# Patient Record
Sex: Male | Born: 1940
Health system: Southern US, Community
[De-identification: ages and names within clinical notes are randomized; demographics above are authoritative.]

## PROBLEM LIST (undated history)

## (undated) DIAGNOSIS — E222 Syndrome of inappropriate secretion of antidiuretic hormone: Secondary | ICD-10-CM

## (undated) DIAGNOSIS — I639 Cerebral infarction, unspecified: Secondary | ICD-10-CM

## (undated) DIAGNOSIS — I10 Essential (primary) hypertension: Secondary | ICD-10-CM

## (undated) DIAGNOSIS — N400 Enlarged prostate without lower urinary tract symptoms: Secondary | ICD-10-CM

## (undated) DIAGNOSIS — F419 Anxiety disorder, unspecified: Secondary | ICD-10-CM

## (undated) DIAGNOSIS — B029 Zoster without complications: Secondary | ICD-10-CM

## (undated) DIAGNOSIS — M1990 Unspecified osteoarthritis, unspecified site: Secondary | ICD-10-CM

## (undated) DIAGNOSIS — C4492 Squamous cell carcinoma of skin, unspecified: Secondary | ICD-10-CM

## (undated) DIAGNOSIS — K219 Gastro-esophageal reflux disease without esophagitis: Secondary | ICD-10-CM

## (undated) DIAGNOSIS — H269 Unspecified cataract: Secondary | ICD-10-CM

## (undated) DIAGNOSIS — I96 Gangrene, not elsewhere classified: Secondary | ICD-10-CM

## (undated) DIAGNOSIS — I2510 Atherosclerotic heart disease of native coronary artery without angina pectoris: Secondary | ICD-10-CM

## (undated) DIAGNOSIS — I509 Heart failure, unspecified: Secondary | ICD-10-CM

## (undated) DIAGNOSIS — I214 Non-ST elevation (NSTEMI) myocardial infarction: Secondary | ICD-10-CM

## (undated) DIAGNOSIS — IMO0001 Reserved for inherently not codable concepts without codable children: Secondary | ICD-10-CM

## (undated) DIAGNOSIS — K279 Peptic ulcer, site unspecified, unspecified as acute or chronic, without hemorrhage or perforation: Secondary | ICD-10-CM

## (undated) HISTORY — PX: TOTAL KNEE ARTHROPLASTY: SHX125

## (undated) HISTORY — PX: OTHER SURGICAL HISTORY: SHX169

## (undated) HISTORY — PX: UPPER GASTROINTESTINAL ENDOSCOPY: SHX188

## (undated) HISTORY — PX: INGUINAL HERNIA REPAIR: SUR1180

## (undated) HISTORY — DX: Unspecified osteoarthritis, unspecified site: M19.90

## (undated) HISTORY — DX: Unspecified cataract: H26.9

## (undated) HISTORY — PX: TONSILLECTOMY AND ADENOIDECTOMY: SHX28

## (undated) HISTORY — PX: EYE SURGERY: SHX253

## (undated) HISTORY — PX: COLONOSCOPY: SHX174

## (undated) HISTORY — PX: TRANSURETHRAL RESECTION OF PROSTATE: SHX73

## (undated) HISTORY — PX: REVERSE TOTAL SHOULDER ARTHROPLASTY: SHX2344

## (undated) HISTORY — DX: Zoster without complications: B02.9

## (undated) HISTORY — DX: Benign prostatic hyperplasia without lower urinary tract symptoms: N40.0

## (undated) HISTORY — DX: Squamous cell carcinoma of skin, unspecified: C44.92

## (undated) HISTORY — PX: CATARACT EXTRACTION, BILATERAL: SHX1313

## (undated) HISTORY — DX: Essential (primary) hypertension: I10

## (undated) HISTORY — DX: Gastro-esophageal reflux disease without esophagitis: K21.9

---

## 2001-01-14 ENCOUNTER — Ambulatory Visit (HOSPITAL_BASED_OUTPATIENT_CLINIC_OR_DEPARTMENT_OTHER): Admission: RE | Admit: 2001-01-14 | Discharge: 2001-01-15 | Payer: Self-pay | Admitting: Orthopedic Surgery

## 2007-11-02 LAB — METABOLIC PANEL, COMPREHENSIVE
A-G Ratio: 0.5 — ABNORMAL LOW (ref 1.1–2.2)
ALT (SGPT): 31 U/L (ref 30–65)
AST (SGOT): 23 U/L (ref 15–37)
Albumin: 2.7 g/dL — ABNORMAL LOW (ref 3.5–5.0)
Alk. phosphatase: 95 U/L (ref 50–136)
Anion gap: 5 mmol/L (ref 5–15)
BUN/Creatinine ratio: 14 (ref 12–20)
BUN: 27 MG/DL — ABNORMAL HIGH (ref 6–20)
Bilirubin, total: 0.5 MG/DL (ref <1.0)
CO2: 30 MMOL/L (ref 21–32)
Calcium: 7.9 MG/DL — ABNORMAL LOW (ref 8.5–10.1)
Chloride: 96 MMOL/L — ABNORMAL LOW (ref 97–108)
Creatinine: 1.9 MG/DL — ABNORMAL HIGH (ref 0.6–1.3)
GFR est AA: 46 mL/min/{1.73_m2} — ABNORMAL LOW (ref >60)
GFR est non-AA: 38 mL/min/{1.73_m2} — ABNORMAL LOW (ref >60)
Globulin: 5.1 g/dL — ABNORMAL HIGH (ref 2.0–4.0)
Glucose: 298 MG/DL — ABNORMAL HIGH (ref 50–100)
Potassium: 3.4 MMOL/L — ABNORMAL LOW (ref 3.5–5.1)
Protein, total: 7.8 g/dL (ref 6.4–8.2)
Sodium: 131 MMOL/L — ABNORMAL LOW (ref 136–145)

## 2007-11-02 LAB — CBC WITH AUTOMATED DIFF
ABS. BASOPHILS: 0 10*3/uL (ref 0.0–0.1)
ABS. BASOPHILS: 0 10*3/uL (ref 0.0–0.1)
ABS. EOSINOPHILS: 0.1 10*3/uL (ref 0.0–0.4)
ABS. EOSINOPHILS: 0 10*3/uL (ref 0.0–0.4)
ABS. LYMPHOCYTES: 1.8 10*3/uL (ref 0.8–3.5)
ABS. LYMPHOCYTES: 1.5 10*3/uL (ref 0.8–3.5)
ABS. MONOCYTES: 1.2 10*3/uL — ABNORMAL HIGH (ref 0–1.0)
ABS. MONOCYTES: 0.8 10*3/uL (ref 0–1.0)
ABS. NEUTROPHILS: 9.5 10*3/uL — ABNORMAL HIGH (ref 1.8–8.0)
ABS. NEUTROPHILS: 9.5 10*3/uL — ABNORMAL HIGH (ref 1.8–8.0)
BASOPHILS: 0 % (ref 0–1)
BASOPHILS: 0 % (ref 0–1)
EOSINOPHILS: 1 % (ref 0–7)
EOSINOPHILS: 0 % (ref 0–7)
HCT: 33.1 % — ABNORMAL LOW (ref 36.6–50.3)
HCT: 32.3 % — ABNORMAL LOW (ref 36.6–50.3)
HGB: 11.4 g/dL — ABNORMAL LOW (ref 12.1–17.0)
HGB: 10.6 g/dL — ABNORMAL LOW (ref 12.1–17.0)
LYMPHOCYTES: 14 % (ref 12–49)
LYMPHOCYTES: 13 % (ref 12–49)
MCH: 29.8 PG (ref 26.0–34.0)
MCH: 28.6 PG (ref 26.0–34.0)
MCHC: 34.4 g/dL (ref 30.0–35.0)
MCHC: 32.8 g/dL (ref 30.0–35.0)
MCV: 86.4 FL (ref 80.0–99.0)
MCV: 87.3 FL (ref 80.0–99.0)
MONOCYTES: 10 % (ref 5–13)
MONOCYTES: 7 % (ref 5–13)
NEUTROPHILS: 75 % (ref 32–75)
NEUTROPHILS: 80 % — ABNORMAL HIGH (ref 32–75)
PLATELET: 137 10*3/uL — ABNORMAL LOW (ref 150–400)
PLATELET: 157 10*3/uL (ref 150–400)
RBC: 3.83 M/uL — ABNORMAL LOW (ref 4.10–5.70)
RBC: 3.7 M/uL — ABNORMAL LOW (ref 4.10–5.70)
RDW: 16.1 % — ABNORMAL HIGH (ref 11.5–14.5)
RDW: 16.3 % — ABNORMAL HIGH (ref 11.5–14.5)
WBC: 12.6 10*3/uL — ABNORMAL HIGH (ref 4.1–11.1)
WBC: 11.8 10*3/uL — ABNORMAL HIGH (ref 4.1–11.1)

## 2007-11-02 LAB — TROPONIN I
Troponin-I, Qt.: 0.2 ng/mL — ABNORMAL HIGH (ref 0.0–0.05)
Troponin-I, Qt.: 0.09 ng/mL — ABNORMAL HIGH (ref 0.0–0.05)

## 2007-11-02 LAB — MAGNESIUM: Magnesium: 2.1 MG/DL (ref 1.6–2.4)

## 2007-11-02 LAB — PROTHROMBIN TIME + INR
INR: 1.1 (ref 0.9–1.1)
Prothrombin time: 11.4 SECS — ABNORMAL HIGH (ref 9.0–11.0)

## 2007-11-02 LAB — URINALYSIS W/ REFLEX CULTURE
Bacteria: NEGATIVE /HPF
Bilirubin: NEGATIVE
Glucose: 100 MG/DL — AB
Ketone: NEGATIVE MG/DL
Leukocyte Esterase: NEGATIVE
Nitrites: NEGATIVE
Protein: 300 MG/DL — AB
Specific gravity: 1.014 (ref 1.003–1.030)
Urobilinogen: 1 EU/DL (ref 0.2–1.0)
pH (UA): 5.5 (ref 5.0–8.0)

## 2007-11-02 LAB — PTT, ACTIVATED: aPTT: 31.7 s (ref 24.0–33.0)

## 2007-11-02 LAB — CK-MB,QUANT.
CK - MB: 2.2 NG/ML (ref 0–5)
CK-MB Index: 0.8 (ref 0–2.5)

## 2007-11-02 LAB — RETICULOCYTE COUNT: Reticulocyte count: 1.3 % (ref 0.5–1.5)

## 2007-11-02 LAB — CK W/ CKMB & INDEX
CK - MB: 1.2 NG/ML (ref 0–5)
CK-MB Index: 0.5 (ref 0–2.5)
CK: 251 U/L — ABNORMAL HIGH (ref 35–232)

## 2007-11-02 LAB — METABOLIC PANEL, BASIC
Anion gap: 6 mmol/L (ref 5–15)
BUN/Creatinine ratio: 15 (ref 12–20)
BUN: 24 MG/DL — ABNORMAL HIGH (ref 6–20)
CO2: 32 MMOL/L (ref 21–32)
Calcium: 8.2 MG/DL — ABNORMAL LOW (ref 8.5–10.1)
Chloride: 99 MMOL/L (ref 97–108)
Creatinine: 1.6 MG/DL — ABNORMAL HIGH (ref 0.6–1.3)
GFR est AA: 56 mL/min/{1.73_m2} — ABNORMAL LOW (ref >60)
GFR est non-AA: 46 mL/min/{1.73_m2} — ABNORMAL LOW (ref >60)
Glucose: 131 MG/DL — ABNORMAL HIGH (ref 50–100)
Potassium: 3.4 MMOL/L — ABNORMAL LOW (ref 3.5–5.1)
Sodium: 137 MMOL/L (ref 136–145)

## 2007-11-02 LAB — GLUCOSE, POC
Glucose (POC): 198 mg/dL — ABNORMAL HIGH (ref 75–110)
Glucose (POC): 42 mg/dL — CL (ref 75–110)
Glucose (POC): 43 mg/dL — CL (ref 75–110)
Glucose (POC): 99 mg/dL (ref 75–110)
Glucose (POC): 388 mg/dL — ABNORMAL HIGH (ref 75–110)
Glucose (POC): 357 mg/dL — ABNORMAL HIGH (ref 75–110)

## 2007-11-02 LAB — VITAMIN B12 & FOLATE
Folate: 11.3 ng/mL (ref 5.4–24.0)
Vitamin B12: 1333 pg/mL — ABNORMAL HIGH (ref 211–911)

## 2007-11-02 LAB — BNP: BNP: 102 PG/ML — ABNORMAL HIGH (ref 0–100)

## 2007-11-02 LAB — PHOSPHORUS: Phosphorus: 3.6 MG/DL (ref 2.5–4.9)

## 2007-11-02 LAB — TSH 3RD GENERATION: TSH: 2.37 u[IU]/mL (ref 0.35–5.5)

## 2007-11-02 LAB — CK W/ REFLX CKMB: CK: 281 U/L — ABNORMAL HIGH (ref 35–232)

## 2007-11-02 LAB — FERRITIN: Ferritin: 96 NG/ML (ref 22–322)

## 2007-11-02 LAB — IRON: Iron: 21 ug/dL — ABNORMAL LOW (ref 35–150)

## 2007-11-02 NOTE — Consults (Signed)
Name: Travis [name], Travis [name] Admitted: 11/01/2007  MR #: [id number] DOB: July 06, 1940  Account #: [id number] Age: 67  Consultant: Tharon Aquas, MD Location: [phone]     CONSULTATION REPORT    DATE OF CONSULTATION: 11/02/2007  REFERRING PHYSICIAN:      REQUESTING PHYSICIAN: Orpah Clinton, MD    CARDIOLOGIST: Carmelia Roller, MD    REASON FOR CONSULTATION: Abnormal troponin.    HISTORY OF PRESENT ILLNESS: The patient is a 67 year old male with a  history of known coronary artery disease and prior myocardial infarction.  His last catheterization was in 2006. There was moderate disease in  multiple blood vessels, but also an occluded small left circumflex. His  last stress test was in September 2008, showing ischemia in the circumflex  territory, but not elsewhere. His LV systolic function was normal. The  patient has been doing well from a cardiac standpoint without complaints  of chest pain or shortness of breath and has been active. His problems  started a couple days ago (Friday) and he has a variety of complaints,  including weakness, chills, and cough with sputum production. He has been  febrile in the hospital with temperatures of 101.8. Chest x-ray is  consistent with infiltrates at the bases bilaterally. He is being treated  for probable pneumonia. During the course of his workup, he has had  cardiac enzymes checked with a troponin level which has been mildly  abnormal in the indeterminate range at 0.20 to 0.09. His CK-MB was normal  at 1.2. The patient denies any worsening cardiac symptoms and felt well  until just Friday.    PAST MEDICAL HISTORY  1. Coronary artery disease. Last cardiac catheterization was in July  2006; it demonstrated mild-to-moderate diffuse disease with complete  occlusion of the proximal circumflex, which was a relatively small vessel,  LVEF of 65%. He was medically managed. His last stress test was February 06, 2007, showing ischemia in the circumflex territory, EF 55%. Last   echocardiogram was August 2008, showing LVH with left atrial enlargement  and normal function. The patient has a history of an MI back in 2006. He  has been medically managed.  2. Peripheral vascular disease.  3. Diabetes.  4. Hypertension.  5. Hyperlipidemia.  6. Hypothyroidism.  7. Anemia.  8. Pneumonia.  9. BPH.    MEDICATIONS ON ARRIVAL: Included  1. Aspirin 81.  2. Cardizem 240.  3. Lasix 40 twice daily.  4. Imdur 60.  5. Lantus.  6. Levothyroxine.  7. Lisinopril 40 daily.  8. Metoprolol 100 twice daily.  9. Plavix 75 daily.  10. Terazosin 10 at bedtime.  11. Zocor 40 each evening.  12. Zantac 150 twice daily.    ALLERGIES: No known drug allergies.    SOCIAL HISTORY: The patient lives at home. Rare alcohol use, no tobacco.      FAMILY HISTORY: Noncontributory.    REVIEW OF SYSTEMS: Positive for weakness, diarrhea, chills, cough, sputum  production. A 10-point review of systems is otherwise negative. No  headaches, vision changes, weight changes, chest pain, abdominal pain,  palpitations, orthopnea, syncope, or PND.    PHYSICAL EXAMINATION  VITAL SIGNS: Temperature 101.8, BP has been 171/71 or greater, pulse 80.  GENERAL: The patient is in no apparent distress.  HEENT: Normocephalic, atraumatic.  NECK: Supple. JVP is not noticeably elevated.  HEART: Regular rate and rhythm. A 1/6 systolic ejection murmur.  LUNGS: There are coarse ________ of the bases bilaterally, but otherwise  clear.  ABDOMEN: Soft,  nontender, nondistended.  EXTREMITIES: No clubbing, cyanosis, or edema.  PSYCHIATRIC: Appropriate affect.    EKG: Sinus rhythm with lateral T-wave inversion. There is no change in  this EKG from prior.    Chest x-ray: Low-grade bibasilar infiltrates.    LABORATORY DATA: White count 11.8, hemoglobin 10.6, platelets 157. Sodium  137, potassium 3.4, creatinine 1.6, glucose 131. Troponin of 0.09  to 0.20. BNP 102. INR 1.1.    ASSESSMENT AND PLAN: The patient is a 67 year old male with a history of   hypertension, coronary artery disease, diabetes, hyperlipidemia, and  hypothyroidism, who is admitted with pneumonia. Cardiology was consulted  for his abnormal troponin. His troponin is in the indeterminate range.  His EKG is unchanged and he denies any cardiac symptoms. His troponin is  likely elevated due to his known CAD, LVH, acute renal failure, or possibly  increased demand from the infection. There is no evidence for an acute  coronary syndrome. I will treat him medically, as you are, for blood  pressure control as well as the infection. He does need tighter blood  pressure control it seems. No further cardiac testing is needed at this  time. The patient can see Dr. Delton See as an outpatient for further plans.        E-Signed By  Tharon Aquas, MD 11/16/2007 09:51    Tharon Aquas, MD    cc: Orpah Clinton, MD   Tharon Aquas, M.D.        SAK/WMX; D: 11/02/2007 1:40 P; T: 11/02/2007 2:55 P; DOC# [id number]; Job#  [id number]

## 2007-11-02 NOTE — H&P (Addendum)
Name: Travis [name], Travis [name] Admitted: 11/01/2007  MR #: [id number] DOB: 1941-02-19  Account #: [id number] Age: 67  Physician: Esperanza Sheets, MD Location: [phone]     HISTORY PHYSICAL      PRIMARY CARE PHYSICIAN: [facility]    PRIMARY CARDIOLOGIST: Norwood Levo, III, MD    CHIEF COMPLAINT: Weakness.    HISTORY OF PRESENT ILLNESS: The patient is a 67 year old African American  male history of coronary disease, hypertension, diabetes, and prior history  of pneumonia, who presents now to the emergency room with a 2-day history  of a constellation of symptoms. His main complaint is of weakness, but he  also has brief, diarrhea, chills, and presumed fever, cough with sputum  production. He has not taken any additional medications. He has  maintained his usual medications. He has no sick contacts. Initial labs  in the emergency room indicate sepsis secondary to pneumonia, elevated  troponin, uncontrolled diabetes, acute renal failure. Will admit him for  management.    PAST MEDICAL HISTORY: Coronary artery disease with MI, congestive heart  failure, hypertension, diabetes type 2, hyperlipidemia, pneumonia,  hypothyroidism, anemia, benign prostatic hypertrophy.    PAST SURGICAL HISTORY: None.    MEDICATIONS  Doses are unknown to the patient or his family. Includes  1. Iron.  2. Lisinopril  3. Aspirin.  4. Synthroid.  5. Isosorbide mononitrate.  6. Fish oil.  7. Vitamin B12.  8. Metoprolol.  9. Terazosin.  10. Lasix.  11. Ranitidine.  12. Diltiazem.  13. Crestor.  14. Novolin.  15. Lantus.    ALLERGIES: None.    SOCIAL HISTORY: Lives at home. Rare alcohol. No tobacco or drugs.    FAMILY HISTORY: Of diabetes and cancer.    REVIEW OF SYSTEMS: Positive for the weakness, diarrhea, chills, cough,  sputum production. Negative for any headache, vision change, hearing  change, rhinitis, sore throat, stiff neck, orthopnea, chest pain,   palpitations, abdominal pain, nausea, vomiting, constipation, dysuria,  polyuria, skin changes, rashes, bleeding, bruising, myalgias, arthralgias,  night sweats, weight loss, depression.    PHYSICAL EXAMINATION  VITAL SIGNS: Temperature 99.3, blood pressure 171/71, pulse 79,  respirations 20. Saturating 96% on room air. Pain 0/10.  GENERAL: The patient is a well-developed, adult [place] American male,  lying in bed, in no apparent distress. He appears his stated age.  HEAD: Normocephalic, atraumatic. Pupils equal, round, reactive to light.   Extraocular movements intact. Conjunctivae are pale and normal. Sclerae  are white. Nares are patent. Oropharynx clean, dry, no lesions.  NECK: Supple. No lymphadenopathy. No JVD. No bruits.  LUNGS: Clear to auscultation bilaterally, with trace rhonchi, trace  crackles. No accessory muscle use. He did have minimal cough during the  exam.  HEART: With a regular rate and rhythm, 1/6 systolic ejection murmur. No  thrills or bruits. There are 2+ pulses in his periphery.  ABDOMEN: Soft, nontender, nondistended, positive bowel sounds. No palpable  mass. No guarding, no rebound.  EXTREMITIES: Cool, dry, full range of motion, 5- out of 5 strength in the  upper and lower left and right extremities. There is no clubbing, cyanosis,  or edema.  SKIN: Otherwise normal for age and race. No rashes or ulcers. Slightly  decreased turgor. No bleeding or bruising.  NEUROLOGIC: Cranial nerves 2 to 12 grossly intact. Deep tendon reflexes  normal. Sensation normal.  PSYCHIATRIC: Alert and x3. Flat affect, poor insight, poor distant and  recent memory.    LABORATORY DATA: Show white blood cells 12.6, with  75% granulocytes;  hemoglobin 11.4, platelets 137. PT 11, INR 1.1, aPTT 32. Sodium 131,  potassium 3.4, chloride 96, bicarbonate 30, BUN 27, creatinine 1.9, glucose  298. Calcium 7.9, albumin 2.7, AST 23, ALT 31, alkaline phosphatase 95,  bilirubin 0.5, CPK of 281, troponin 0.20.     Blood cultures are pending. Urinalysis is pending and cultures are  pending.    ELECTROCARDIOGRAM: Shows first degree AV block, normal sinus rhythm  otherwise. No ST or T-wave abnormalities otherwise, except for T-wave  inversions in the lateral leads.    RADIOLOGIC DATA: Chest x-ray shows bilateral infiltrates.    OVERALL ASSESSMENT: A 67 year old male with several days history of  weakness, chills and cough suggestive of pneumonia, an x-ray which is  ambiguous, elevated white count. Will admit him for pneumonia.    PLAN  1. Pneumonia. Will start empiric ceftriaxone and azithromycin. Give him  oxygen as necessary. Blood cultures are pending.  2. Coronary artery disease, elevated troponins. Will place on  telemetry. Follow cardiac enzymes. Get Dr. Delton See to see him.  3. Diabetes type 2 uncontrolled, with renal complications. Will place  him on protocol. Continue Lantus. Sliding insulin NovoLog. Check a  hemoglobin A1c. Diabetic diet. Counseling.  4. Acute renal failure, secondary to dehydration. Will give him IV fluids  for now.  5. Sepsis. Most likely secondary to pneumonia. He meets criteria based  on fevers and chills at home, with an elevated white count. Give him IV  fluids, antibiotics, supportive care. Blood cultures pending.  6. Chronic kidney disease, secondary to diabetes. Will follow this issue.  7. Hyponatremia. Give him normal saline. Follow the issue. It is also,  in part, due to his Lasix, likely.  8. Hypokalemia. Will replete it. Also secondary to his Lasix.  9. Anemia, mild. Might worsen after hydration. Check his labs. Continue  iron.  10. Hypertension. Resume his usual medications, except Lasix.  11. Hypothyroidism. Continue Synthroid. Check a TSH level.  12. Benign prostatic hypertrophy, currently no symptoms.  Continue terazosin.  13. Hyperlipidemia. Continue Crestor.  14. Congestive heart failure, chronic and stable at this point. No sign   of exacerbation. Will get Dr. Delton See to evaluate this also.    Total time spent on admission including discussion with the patient,  family, nursing, review of records and coordination of care  was approximately 40 minutes.          E-Signed By  Esperanza Sheets, MD 11/02/2007 06:12    Esperanza Sheets, MD    cc: Esperanza Sheets, MD   Norwood Levo, M.D.        MM/wmx; D: 11/01/2007 11:07 P; T: 11/02/2007 12:01 A; DOC# [id number]; Job#  [id number]

## 2007-11-03 LAB — CBC WITH AUTOMATED DIFF
ABS. BASOPHILS: 0 10*3/uL (ref 0.0–0.1)
ABS. EOSINOPHILS: 0.1 10*3/uL (ref 0.0–0.4)
ABS. LYMPHOCYTES: 1.1 10*3/uL (ref 0.8–3.5)
ABS. MONOCYTES: 1.2 10*3/uL — ABNORMAL HIGH (ref 0–1.0)
ABS. NEUTROPHILS: 8.2 10*3/uL — ABNORMAL HIGH (ref 1.8–8.0)
BASOPHILS: 0 % (ref 0–1)
EOSINOPHILS: 1 % (ref 0–7)
HCT: 32.7 % — ABNORMAL LOW (ref 36.6–50.3)
HGB: 10.8 g/dL — ABNORMAL LOW (ref 12.1–17.0)
LYMPHOCYTES: 10 % — ABNORMAL LOW (ref 12–49)
MCH: 28.9 PG (ref 26.0–34.0)
MCHC: 33 g/dL (ref 30.0–35.0)
MCV: 87.4 FL (ref 80.0–99.0)
MONOCYTES: 11 % (ref 5–13)
NEUTROPHILS: 78 % — ABNORMAL HIGH (ref 32–75)
PLATELET: 163 10*3/uL (ref 150–400)
RBC: 3.74 M/uL — ABNORMAL LOW (ref 4.10–5.70)
RDW: 16 % — ABNORMAL HIGH (ref 11.5–14.5)
WBC: 10.5 10*3/uL (ref 4.1–11.1)

## 2007-11-03 LAB — METABOLIC PANEL, BASIC
Anion gap: 6 mmol/L (ref 5–15)
BUN/Creatinine ratio: 13 (ref 12–20)
BUN: 18 MG/DL (ref 6–20)
CO2: 27 MMOL/L (ref 21–32)
Calcium: 8.2 MG/DL — ABNORMAL LOW (ref 8.5–10.1)
Chloride: 101 MMOL/L (ref 97–108)
Creatinine: 1.4 MG/DL — ABNORMAL HIGH (ref 0.6–1.3)
GFR est AA: >60 mL/min/{1.73_m2} (ref >60)
GFR est non-AA: 54 mL/min/{1.73_m2} — ABNORMAL LOW (ref >60)
Glucose: 86 MG/DL (ref 50–100)
Potassium: 3.4 MMOL/L — ABNORMAL LOW (ref 3.5–5.1)
Sodium: 134 MMOL/L — ABNORMAL LOW (ref 136–145)

## 2007-11-03 LAB — GLUCOSE, POC
Glucose (POC): 372 mg/dL — ABNORMAL HIGH (ref 75–110)
Glucose (POC): 118 mg/dL — ABNORMAL HIGH (ref 75–110)
Glucose (POC): 31 mg/dL — CL (ref 75–110)
Glucose (POC): 53 mg/dL — ABNORMAL LOW (ref 75–110)
Glucose (POC): 67 mg/dL — ABNORMAL LOW (ref 75–110)
Glucose (POC): 401 mg/dL — ABNORMAL HIGH (ref 75–110)
Glucose (POC): 341 mg/dL — ABNORMAL HIGH (ref 75–110)
Glucose (POC): 157 mg/dL — ABNORMAL HIGH (ref 75–110)

## 2007-11-03 LAB — HEMOGLOBIN A1C WITH EAG: Hemoglobin A1c: 7.7 % — ABNORMAL HIGH (ref 4.2–5.8)

## 2007-11-04 LAB — GLUCOSE, POC
Glucose (POC): 77 mg/dL (ref 75–110)
Glucose (POC): 297 mg/dL — ABNORMAL HIGH (ref 75–110)
Glucose (POC): 295 mg/dL — ABNORMAL HIGH (ref 75–110)
Glucose (POC): 138 mg/dL — ABNORMAL HIGH (ref 75–110)

## 2007-11-04 LAB — BUN/CREATININE
BUN: 19 mg/dL (ref 6–20)
Creatinine: 1.5 mg/dL — ABNORMAL HIGH (ref 0.6–1.3)
GFR est AA: 60 mL/min/{1.73_m2} — ABNORMAL LOW (ref >60)
GFR est non-AA: 49 mL/min/{1.73_m2} — ABNORMAL LOW (ref >60)

## 2007-11-05 LAB — CBC WITH AUTOMATED DIFF
ABS. BASOPHILS: 0 10*3/uL (ref 0.0–0.1)
ABS. EOSINOPHILS: 0.3 10*3/uL (ref 0.0–0.4)
ABS. LYMPHOCYTES: 2.1 10*3/uL (ref 0.8–3.5)
ABS. MONOCYTES: 1.1 10*3/uL — ABNORMAL HIGH (ref 0–1.0)
ABS. NEUTROPHILS: 6 10*3/uL (ref 1.8–8.0)
BASOPHILS: 0 % (ref 0–1)
EOSINOPHILS: 4 % (ref 0–7)
HCT: 30.8 % — ABNORMAL LOW (ref 36.6–50.3)
HGB: 10.5 g/dL — ABNORMAL LOW (ref 12.1–17.0)
LYMPHOCYTES: 22 % (ref 12–49)
MCH: 29.2 PG (ref 26.0–34.0)
MCHC: 34.1 g/dL (ref 30.0–35.0)
MCV: 85.8 FL (ref 80.0–99.0)
MONOCYTES: 12 % (ref 5–13)
NEUTROPHILS: 62 % (ref 32–75)
PLATELET: 181 10*3/uL (ref 150–400)
RBC: 3.59 M/uL — ABNORMAL LOW (ref 4.10–5.70)
RDW: 15.7 % — ABNORMAL HIGH (ref 11.5–14.5)
WBC: 9.6 10*3/uL (ref 4.1–11.1)

## 2007-11-05 LAB — METABOLIC PANEL, BASIC
Anion gap: 7 mmol/L (ref 5–15)
BUN/Creatinine ratio: 12 (ref 12–20)
BUN: 16 mg/dL (ref 6–20)
CO2: 27 MMOL/L (ref 21–32)
Calcium: 8.1 MG/DL — ABNORMAL LOW (ref 8.5–10.1)
Chloride: 101 MMOL/L (ref 97–108)
Creatinine: 1.3 mg/dL (ref 0.6–1.3)
GFR est AA: >60 mL/min/{1.73_m2} (ref >60)
GFR est non-AA: 58 mL/min/{1.73_m2} — ABNORMAL LOW (ref >60)
Glucose: 213 MG/DL — ABNORMAL HIGH (ref 50–100)
Potassium: 4 MMOL/L (ref 3.5–5.1)
Sodium: 135 MMOL/L — ABNORMAL LOW (ref 136–145)

## 2007-11-05 LAB — GLUCOSE, POC
Glucose (POC): 171 mg/dL — ABNORMAL HIGH (ref 75–110)
Glucose (POC): 275 mg/dL — ABNORMAL HIGH (ref 75–110)
Glucose (POC): 426 mg/dL — ABNORMAL HIGH (ref 75–110)
Glucose (POC): 165 mg/dL — ABNORMAL HIGH (ref 75–110)

## 2007-11-06 LAB — VANCOMYCIN, TROUGH
Reported dose date: 61109
Reported dose time:: 1700
Vancomycin,trough: 6.9 ug/ml (ref 5.0–10.0)

## 2007-11-06 LAB — GLUCOSE, POC
Glucose (POC): 48 mg/dL — CL (ref 75–110)
Glucose (POC): 52 mg/dL — ABNORMAL LOW (ref 75–110)
Glucose (POC): 79 mg/dL (ref 75–110)
Glucose (POC): 94 mg/dL (ref 75–110)
Glucose (POC): 330 mg/dL — ABNORMAL HIGH (ref 75–110)
Glucose (POC): 466 mg/dL — ABNORMAL HIGH (ref 75–110)
Glucose (POC): 161 mg/dL — ABNORMAL HIGH (ref 75–110)

## 2007-11-06 LAB — BUN/CREATININE
BUN: 19 mg/dL (ref 6–20)
Creatinine: 1.5 mg/dL — ABNORMAL HIGH (ref 0.6–1.3)
GFR est AA: 60 mL/min/{1.73_m2} — ABNORMAL LOW (ref >60)
GFR est non-AA: 49 mL/min/{1.73_m2} — ABNORMAL LOW (ref >60)

## 2007-11-06 NOTE — Consults (Signed)
Name: Travis [name], Travis [name] Admitted: 11/01/2007  MR #: [id number] DOB: 11/07/1940  Account #: [id number] Age: 67  Consultant: Kermit Balo, M.D. Location: [id number]     CONSULTATION REPORT    DATE OF CONSULTATION: 11/06/2007  REFERRING PHYSICIAN:      HISTORY OF PRESENT ILLNESS: Dr. Eulah Pont has asked me to see this patient  who is a 67 year old gentleman for pneumonia with persistent fevers. The  patient was originally admitted on November 01, 2007 with complaints of fevers,  chills, and cough. The patient states that he was admitted on Sunday but  had been feeling poorly since about Friday. He thought that he was  initially getting better but then felt worse by the day of admission. The  patient felt generally lethargic and worn out. He states that he was  coughing with mostly clear phlegm. The patient denies any chest pain. He  has had no nausea or vomiting although he does have occasional  indigestion. While in the hospital he has been treated with various  antibiotics. He states that overall he is feeling considerably better than  on admission. His white blood cell count has come down from 12.6 to 9.6  thousand. The major concern at this point is that he has had persistent  fevers. The patient's last recorded fever was at 6 a.m. yesterday morning  when he had a temperature of 101.2. The patient tells me that overall he  is feeling nearly back to baseline except when he does have the fevers  which makes him feel generally poor.    PAST MEDICAL HISTORY: Remarkable for:  1. Coronary artery disease with history of MI.  2. Congestive heart failure.  3. Hypertension.  4. Diabetes.  5. BPH.  6. Hypothyroid.    SOCIAL HISTORY: The patient has a remote history of smoking. He states  that he stopped smoking about 30 years ago. 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 He retired from the [employer] in 2000. The patient lives alone. He drinks alcohol less than  once a week. Of note, is the fact that when he was in the [employer], he was  stationed in [place] as well as [place]. He does not recall ever having  any PPD placed.    FAMILY HISTORY: Negative for inheritable lung disease.    REVIEW OF SYSTEMS: HEENT: The patient denies any headaches or sinus  complaints. CARDIAC: As above. PULMONARY: As above. GASTROINTESTINAL:  As above. GENITOURINARY: Negative. MUSCULOSKELETAL: Remarkable for some  occasional myalgias with fevers. NEUROLOGIC: Negative. SKIN:  Negative. PSYCHIATRIC: Negative.    PHYSICAL EXAMINATION:  VITAL SIGNS: Temperature is 98.3, blood pressure is 193/81, pulse 89.  GENERAL: This is a well-developed [place] American male who is in no acute  distress.  HEENT: The patient's eyes are anicteric.  NECK: Supple without palpable mass, lymphadenopathy.  HEART: Regular rate and rhythm.  LUNGS: The patient has bibasilar crackles. He has no frank wheezing or  rhonchi.  ABDOMEN: Obese. It is soft, nontender.  EXTREMITIES: With 1 to 2+ pretibial edema.  NEUROLOGIC: Grossly nonfocal.  SKIN: Warm and dry.    LABORATORY DATA: White count is 9.6, hemoglobin 10.5, platelet count 181.  Sodium 135, potassium 4.0, chloride 101, CO2 is 27. BUN 16, creatinine 1.3,  glucose 213.    Patient's chest x-ray from 11/01/2007 was reviewed as was his CT scan from  06/11. The patient's chest x-ray revealed very mild patchy densities when  compared to his baseline film on both lower lung fields. The CT scan shows  clear air space disease and consolidation in his left lower lobe. He also  has some patchy nodular densities but no discrete nodules in his right  lower and middle lobes.    IMPRESSION: Pneumonia: The patient's signs and symptoms are most likely  compatible with a community-acquired pneumonia. The patient seems to be  getting better by all parameters for his fever curve and he has actually   now not had a fever in 24 hours. I would not make any antibiotic changes  at this time. If his fevers return or persist through the weekend, I think  if would be worthwhile to go ahead and proceed with fiberoptic  bronchoscopy. If he remains afebrile for the next 24 to 36 hours, however,  he could possibly go home.    PLAN:  1. We will go ahead and place a PPD especially with his history of travel  to [place].  2. If afebrile over the weekend, it is okay from a pulmonary standpoint to  go ahead and discharge him to home.    Thank you for this consultation.      E-Signed By  Kermit Balo, M.D. 11/09/2007 14:49    Marrianne Sica Leroy Libman, M.D.    cc: Kermit Balo, M.D.        KR/WMX; D: 11/06/2007 10:55 A; T: 11/06/2007 11:38 A; DOC# [id number]; Job#  [id number]

## 2007-11-06 NOTE — Discharge Summary (Signed)
Name: Travis [name], Travis [name] Admitted: 11/01/2007  MR #: [id number] Discharged:  Account #: [id number] DOB: Oct 01, 1940  Physician: Orpah Clinton, MD Age 67     DISCHARGE SUMMARY        This is an interim summary.    SERVICE: Hospital Medicine.    ATTENDING PHYSICIAN: Orpah Clinton, MD.    PRIMARY CARE PROVIDER: [facility].    PRINCIPAL DIAGNOSIS: Pneumonia.    SECONDARY DIAGNOSES:  1. Diabetes mellitus.  2. Hypertension.  3. Anemia.    HISTORY OF PRESENT ILLNESS: The patient is a 67 year old African American  gentleman who was admitted through the Emergency Department with  pneumonia.    HOSPITAL COURSE: The patient was admitted to the 5th floor on Hospitalist  Service. He was initially treated with intravenous ceftriaxone on  presentation to the Emergency Department. He was continued on ceftriaxone,  and intravenous azithromycin was added as well. However, he continued to  be febrile. The patient developed a history of methicillin-resistant  Staphylococcus aureus infection in the past, and so vancomycin was started.  However, the patient has persisted to be febrile in the 48 hours since  vancomycin was started, and so today, we have consulted Infectious Diseases  for further management. Infectious Diseases has recommended a CT scan of  the chest. They increased the dose of ceftriaxone from 1 g intravenous  daily to 2 g intravenous daily, and they have changed his azithromycin to  levofloxacin and continued him on vancomycin, and have checked multiple  serologies for possible etiologies for his pneumonia. In regards to  diabetes, blood sugars have been elevated secondary to his infection. His  insulin regimen has been adjusted and will continue to be adjusted as  needed. Regards to his hypertension, blood pressure has been elevated.  His metoprolol has been increased and we will continue to follow his blood  pressure closely. In regards to his anemia, he appears to have mild anemia   without any evidence of active bleeding. His hemoglobin will be followed.    CURRENT MEDICATIONS:  1. Levofloxacin 750 mg intravenous daily.  2. Ceftriaxone 2 g intravenous daily.  3. Vancomycin per pharmacy protocol.  4. Aspirin 81 mg daily.  5. Diltiazem CD 240 mg daily.  6. Enoxaparin 40 mg daily.  7. Iron sulfate 324 mg daily.  8. Lasix 40 mg 2 times a day.  9. Aspart insulin 8 units before breakfast, lunch, and dinner, with  additional sliding scale insulin as needed.  10. Lantus insulin 42 units every morning.  11. Ismo 30 mg daily.  12. Levothyroxine 100 mcg daily.  13. Prinivil 40 mg daily.  14. Metoprolol 100 mg 2 times a day.  15. Protonix 40 mg daily.  16. Crestor 10 mg daily.  17. Terazosin 5 mg daily.            E-Signed By  Orpah Clinton, MD 11/09/2007 15:33    Orpah Clinton, MD    cc: Orpah Clinton, MD        CEB/FI5; D: 11/05/2007 12:38 P; T: 11/06/2007 1:56 A; DOC# [id number]; Job#  [id number]

## 2007-11-06 NOTE — Consults (Signed)
Name: LUI, BELLIS Admitted: 11/01/2007  MR #: [id number] DOB: 1940-06-19  Account #: [id number] Age: 67  Consultant: Georgia Dom, M.D. Location: [id number]     CONSULTATION REPORT    DATE OF CONSULTATION: 11/05/2007  REFERRING PHYSICIAN:      ATTENDING PHYSICIAN: Marijo File, MD    CONSULT REQUESTED BY: Marijo File, MD    CHIEF COMPLAINT: Weakness.    REASON FOR CONSULTATION: Persistent fever.    HISTORY OF PRESENT ILLNESS: The patient is a 67 year old African American  gentleman who has history of coronary artery disease, hypertension,  diabetes, has a history of pneumonitis last year. He had come and he was  admitted on November 01, 2007, by Dr. Eulah Pont with a history today of fever. He  did have some diarrhea and chills. He also had a dry cough. He denied any  sick contacts. He was admitted by Dr. Eulah Pont and he was started on  ceftriaxone for community-acquired pneumonia. Dr. Verlin Fester had added  azithromycin 2 days ago to his regimen and then yesterday, vancomycin was  added since the patient has a history of MRSA infection. Besides that, the  patient has continued to spike temperatures to 101. The last one was this  morning, which prompted Infectious Disease consult. Of note, he has a chest  x-ray on admission which had shown bibasilar infiltrates. The patient, at  this time, has a dry cough, has some pleuritic chest pain, but besides  that, he does not have any other complaints. He does not have anymore  diarrhea. I have been consulted for recommendations on antibiotics and  further workup.    PAST MEDICAL HISTORY:  1. Coronary artery disease.  2. History of congestive heart failure.  3. History of hypertension.  4. Diabetes, type 2.  5. Hyperlipidemia.  6. Pneumonia.  7. Hypothyroidism.  8. Anemia.  9. BPH.    PAST SURGICAL HISTORY: None.    CURRENT ANTIBIOTICS:  1. Vancomycin per pharmacy protocol.  2. He is on azithromycin 1 g IV every 24 hours.    ALLERGIES: No known drug allergies.     SOCIAL HISTORY: He lives at home. He denied any tobacco or IV drug abuse.  Rarely drinks alcohol.    FAMILY HISTORY: Positive for diabetes and cancer.    REVIEW OF SYSTEMS: Positive for fevers, chills, and weakness. Complains of  diarrhea, dry cough. Denies any shortness of breath, any palpitations. He  denies any abdominal pain. Denies any focal weakness. Complains of  generalized malaise.    PHYSICAL EXAMINATION:  GENERAL: Patient appears in no acute distress. He is sitting in bed.  VITAL SIGNS: His temperature maximum is 101.3. Temperature is 99.3. Blood  pressure is 171/71, pulse 79, respiratory rate 20.  HEENT: Pupils equal, round, and reactive to light and accommodation. There  are no conjunctival hemorrhages. Mouth without exudates.  NECK: Supple. No lymphadenopathy.  LUNGS: He has decreased breath sounds bilaterally. He has no rhonchi.  HEART: S1, S2, regular with an ejection murmur at the left sternal border.  ABDOMEN: Soft, nontender, no organomegaly.  EXTREMITIES: Trace edema. There is no cyanosis or clubbing. There is no  peripheral stigmata of endocarditis.  SKIN: No rash.  NEUROLOGIC: He is alert and oriented x3. There is no focal deficit.    LABORATORY DATA: White blood cell count of 10, down from 12.6 on admission,  hemoglobin is 11.4, platelets 137. Sodium 131, potassium 3.4, chloride 96.  AST 23, ALT 31, alkaline phosphatase 95, total bilirubin 0.5. ________ 281.  Again, the chest x-ray shows bibasilar infiltrates.    ASSESSMENT AND PLAN: The patient is a 67 year old gentleman, who presents  with ________ community-acquired pneumonia. He stated he has had some dry  cough, but still has persistent fevers. Leukocytosis slightly better. At  this time, I think as far as antibiotics, I would continue the vancomycin  and the ceftriaxone and will increase his ceftriaxone dose to 2 g IV q.24h.  We will discontinue the azithromycin and switch the patient to levofloxacin   750 mg IV daily. In this patient with pneumonia and persistent fevers, I  will order a CT scan of the chest to rule out any complications of  pneumonia, such as effusions or empyema. Will also order Legionella urine  antigen. This is suspicious for Legionella with the persistent fever, the  hyponatremia on admission, as well as the diarrhea.    I spent more than 45 minutes in the evaluation, care, and management of  this patient. I discussed with Dr. Verlin Fester.    Thank you so much for allowing me to participate in his care. I will follow  him along with you.      E-Signed By  Georgia Dom, M.D. 11/24/2007 14:17    Georgia Dom, M.D.    cc: Georgia Dom, M.D.        AG/WMX; D: 11/06/2007 12:52 P; T: 11/06/2007 1:43 P; DOC# [id number]; Job#  [id number]

## 2007-11-07 LAB — CHLAMYDIA AB PANEL, IGG/IGM
C.Pneumoniae,IgG: 1:256 {titer}
C.Pneumoniae,IgM: <1:20 {titer}
C.Psittaci,IgG: <1:64 {titer}
C.Psittaci,IgM: <1:20 {titer}
C.Trachomatis,IgG: 1:64 {titer}
C.Trachomatis,IgM: <1:20 {titer}

## 2007-11-07 LAB — CULTURE, BLOOD, PAIRED
Culture result:: NO GROWTH
Report Status: 6132009

## 2007-11-07 LAB — VITAMIN B12 & FOLATE
Folate: 10.9 ng/mL (ref 5.4–24.0)
Vitamin B12: 1047 pg/mL — ABNORMAL HIGH (ref 211–911)

## 2007-11-07 LAB — GLUCOSE, POC
Glucose (POC): 219 mg/dL — ABNORMAL HIGH (ref 75–110)
Glucose (POC): 45 mg/dL — CL (ref 75–110)
Glucose (POC): 52 mg/dL — ABNORMAL LOW (ref 75–110)
Glucose (POC): 292 mg/dL — ABNORMAL HIGH (ref 75–110)
Glucose (POC): 443 mg/dL — ABNORMAL HIGH (ref 75–110)

## 2007-11-07 LAB — TSH 3RD GENERATION: TSH: 7.86 u[IU]/mL — ABNORMAL HIGH (ref 0.35–5.5)

## 2007-11-07 NOTE — Discharge Summary (Signed)
Name: Travis [name], LY Admitted: 11/01/2007  MR #: [id number] Discharged: 11/07/2007  Account #: [id number] DOB: 1941-01-25  Physician: Esperanza Sheets, MD Age 67     DISCHARGE SUMMARY      PRIMARY CARE PHYSICIAN: [facility].    PRIMARY CARDIOLOGIST: Dr. Carmelia Roller.    CHIEF COMPLAINT: Weakness.    HISTORY OF PRESENT ILLNESS: See dictated H and P and consultation notes  for additional details. Briefly, the patient is a 67 year old African  American male with multiple medical conditions presenting to the Emergency  Room with weakness. Initial workup indicated white count. Chest x-ray  consistent with pneumonia. He is admitted for management.    HOSPITAL COURSE: By issues:  1. Pneumonia. We had him seen in consultation by Dr. Leroy Libman and Dr.  Lucienne Capers from Pulmonary. He has been placed empirically on Levaquin,  ceftriaxone, and vancomycin. His cultures did not grow any specific  organism. He was febrile on and off for the first 4 days in the hospital  and then this resolved. He is afebrile for the last 48 hours. Also,  consultation with Dr. Jayme Cloud from Infectious Disease, we decided to  switch the patient to oral Levaquin, discharge him to follow up with the  [facility] as necessary. He would be able to take  over-the-counter Robitussin for any cough relief; otherwise, he is not  requiring oxygen and he is stable at discharge.  2. Diabetes, type 2, uncontrolled with renal complications. It has  been very labile. We are going to ask him to reduce his glargine to 30  units until he is seen again by his primary physician because of periods of  slightly decreased glucose down to about 50.  3. Hypertension. We continued his lisinopril, diltiazem, and  metoprolol. His blood pressures were still elevated, so we added  hydralazine up to 20 mg 3 times a day. This can be reevaluated by his  primary physician also.   4. Anemia, mild, chronic, stable. Hemoccult negative. He is already  on iron. We continued that. He had no other B12 or folate deficiencies.  It is also most likely chronic kidney disease contributing.  5. Chronic kidney disease, stage II from diabetes and hypertension,  stable. He can follow up with renal specialist at the [facility] if  necessary.  6. Coronary artery disease. No signs or symptoms in the hospital. We  had him seen by Dr. Laddie Aquas from Cardiology to rule out any acute  myocardial issues relating to his weakness on presentation. There were  none. They do not think he needed any further workup. We continued his  aspirin and isosorbide mononitrate and beta-blocker and ACE inhibitor.  7. Congestive heart failure, chronic, diastolic. We continued his  Lasix, aspirin, ACE inhibitor, and beta-blocker. Again, Cardiology had no  further workup necessary in hospital.  8. Hyperlipidemia. Continue Crestor at 10. Did not check a lipid  panel in this admission for pneumonia.  9. Hypothyroidism. We will continue Synthroid. There are no signs or  symptoms of hyper or hypothyroidism.  10. Benign prostatic hypertrophy. We continued his terazosin. He had no  complaints.  11. Hyponatremia. It is mild, most likely secondary to his acute  pneumonia. To be followed as an outpatient.    PHYSICAL EXAMINATION AT THE TIME OF DISCHARGE: VITAL SIGNS: Temperature  98.8, pulse 85, blood pressure 179/73, respirations 18, and saturating 97%  on room air. GENERAL: The patient is a well-developed, mildly obese  [place] male, lying  in bed in no apparent distress. He appears  his stated age. HEENT: Head examination is normocephalic and atraumatic.  Pupils are equal, round, and reactive to light. Extraocular movements are  intact. Conjunctivae are pink and normal. Sclerae are white. Nares are  patent. Oropharynx: Clean and dry. No lesions. NECK: Supple. There is   no lymphadenopathy. No JVD. No bruits. LUNGS: With scattered rhonchi in  all lung fields. No accessory muscle use. Minimal cough during the  examination. HEART: With a slightly tachycardic, regular rhythm. No  audible murmur, rub, or gallop; 2+ pulses in his periphery. ABDOMEN:  Soft, nontender, and nondistended. Positive bowel sounds. No palpable  mass. No guarding. No rebound. EXTREMITIES: Cool, dry, full range of  motion, 5-/5 strength in upper and lower left and right extremities. No  clubbing, cyanosis, or edema. SKIN EXAMINATION: Otherwise normal for age  and race. No rashes or ulcers. Normal turgor. No bleeding or bruising.  NEUROLOGIC EXAMINATION: Cranial nerves 2 through 12 are grossly intact.  Deep tendon reflexes are normal. Sensation is slightly reduced.  PSYCHIATRIC EXAMINATION: He is alert and oriented x3. Flat affect and  moderate insight.    DISPOSITION: He will be discharged to home in stable condition with:  PRIMARY DIAGNOSIS: Pneumonia, community acquired.    SECONDARY DIAGNOSES:  1. Diabetes type 2 with renal complications.  2. Coronary artery disease.  3. Congestive heart failure, diastolic, stable.  4. Hypertension.  5. Hyperlipidemia.  6. Benign prostatic hypertrophy.  7. Hypothyroidism.  8. Anemia.    MEDICATIONS AT DISCHARGE:  1. Imdur 60 mg once a day.  2. Synthroid 150 mcg once a day.  3. Lisinopril 40 mg a day.  4. Aspirin 81 mg a day.  5. Iron sulfate 325 mg a day.  6. Diltiazem 240 mg once a day.  7. Metoprolol 100 mg twice a day.  8. Crestor 40 mg a day.  9. Terazosin 5 mg at bedtime as needed for sleep.  10. Ranitidine 150 mg twice a day.  11. Lasix 40 mg twice a day.  12. Lantus 30 units a day.  13. NovoLog 8 to 10 units with meals.  14. Levaquin 750 mg by mouth once a day for 7 additional days.  15. Hydralazine 20 mg by mouth 3 times a day.    PAIN MANAGEMENT: With Tylenol over-the-counter.    ACTIVITY: As tolerated. He may not drive.    DIET: Diabetic diet.     FLUID RESTRICTIONS: 1800 mL per day. Do not smoke.    FOLLOWUP: Follow up at the [facility] for his usual medical care.    Total time spent on discharge including discussion with the patient,  nursing, review of records, and coordination of care approximately 40  minutes.            E-Signed By  Esperanza Sheets, MD 11/15/2007 19:16    Esperanza Sheets, MD    cc: Rudy Jew. Lucienne Capers, M.D.   Georgia Dom, M.D.   Kermit Balo, M.D.   Esperanza Sheets, MD   Norwood Levo, M.D.        MM/FI5; D: 11/07/2007 10:22 A; T: 11/07/2007 11:22 A; DOC# [id number]; Job#  [id number]

## 2007-11-08 LAB — CULTURE, BLOOD, PAIRED
Culture result:: NO GROWTH
Report Status: 6142009

## 2007-11-08 LAB — MYCOPLASMA AB, IGG/IGM
MYCOPLASMA Ab, IgG: 0.52 U/L
MYCOPLASMA Ab,IgM: 0.16 U/L

## 2007-11-08 LAB — LEGIONELLA PNEUMOPHILA AG, URINE: LEGIONELLA Ag: NEGATIVE

## 2007-11-10 LAB — CULTURE, BLOOD, PAIRED
Culture result:: NO GROWTH
Report Status: 6162009

## 2007-12-02 ENCOUNTER — Encounter: Admission: RE | Admit: 2007-12-02 | Discharge: 2007-12-02 | Payer: Self-pay | Admitting: Sports Medicine

## 2008-01-06 ENCOUNTER — Encounter: Admission: RE | Admit: 2008-01-06 | Discharge: 2008-01-06 | Payer: Self-pay | Admitting: Sports Medicine

## 2008-01-14 ENCOUNTER — Encounter: Admission: RE | Admit: 2008-01-14 | Discharge: 2008-01-14 | Payer: Self-pay | Admitting: Sports Medicine

## 2008-01-28 ENCOUNTER — Encounter: Admission: RE | Admit: 2008-01-28 | Discharge: 2008-01-28 | Payer: Self-pay | Admitting: Sports Medicine

## 2008-03-02 LAB — METABOLIC PANEL, COMPREHENSIVE
A-G Ratio: 0.4 — ABNORMAL LOW (ref 1.1–2.2)
ALT (SGPT): 28 U/L — ABNORMAL LOW (ref 30–65)
AST (SGOT): 21 U/L (ref 15–37)
Albumin: 2.6 g/dL — ABNORMAL LOW (ref 3.5–5.0)
Alk. phosphatase: 88 U/L (ref 50–136)
Anion gap: 7 mmol/L (ref 5–15)
BUN/Creatinine ratio: 15 (ref 12–20)
BUN: 25 MG/DL — ABNORMAL HIGH (ref 6–20)
Bilirubin, total: 0.3 MG/DL (ref <1.0)
CO2: 31 MMOL/L (ref 21–32)
Calcium: 9 MG/DL (ref 8.5–10.1)
Chloride: 100 MMOL/L (ref 97–108)
Creatinine: 1.7 MG/DL — ABNORMAL HIGH (ref 0.6–1.3)
GFR est AA: 52 mL/min/{1.73_m2} — ABNORMAL LOW (ref >60)
GFR est non-AA: 43 mL/min/{1.73_m2} — ABNORMAL LOW (ref >60)
Globulin: 5.8 g/dL — ABNORMAL HIGH (ref 2.0–4.0)
Glucose: 91 MG/DL (ref 50–100)
Potassium: 3.6 MMOL/L (ref 3.5–5.1)
Protein, total: 8.4 g/dL — ABNORMAL HIGH (ref 6.4–8.2)
Sodium: 138 MMOL/L (ref 136–145)

## 2008-03-02 LAB — CBC WITH AUTOMATED DIFF
ABS. BASOPHILS: 0 10*3/uL (ref 0.0–0.1)
ABS. EOSINOPHILS: 0.1 10*3/uL (ref 0.0–0.4)
ABS. LYMPHOCYTES: 2.6 10*3/uL (ref 0.8–3.5)
ABS. MONOCYTES: 1.1 10*3/uL — ABNORMAL HIGH (ref 0–1.0)
ABS. NEUTROPHILS: 5.9 10*3/uL (ref 1.8–8.0)
BASOPHILS: 0 % (ref 0–1)
EOSINOPHILS: 1 % (ref 0–7)
HCT: 32.7 % — ABNORMAL LOW (ref 36.6–50.3)
HGB: 11.1 g/dL — ABNORMAL LOW (ref 12.1–17.0)
LYMPHOCYTES: 27 % (ref 12–49)
MCH: 29.7 PG (ref 26.0–34.0)
MCHC: 33.9 g/dL (ref 30.0–35.0)
MCV: 87.4 FL (ref 80.0–99.0)
MONOCYTES: 11 % (ref 5–13)
NEUTROPHILS: 61 % (ref 32–75)
PLATELET: 181 10*3/uL (ref 150–400)
RBC: 3.74 M/uL — ABNORMAL LOW (ref 4.10–5.70)
RDW: 15.9 % — ABNORMAL HIGH (ref 11.5–14.5)
WBC: 9.8 10*3/uL (ref 4.1–11.1)

## 2008-03-02 LAB — URINALYSIS W/ REFLEX CULTURE
Bacteria: NEGATIVE /HPF
Bilirubin: NEGATIVE
Glucose: NEGATIVE MG/DL
Ketone: NEGATIVE MG/DL
Leukocyte Esterase: NEGATIVE
Nitrites: NEGATIVE
Protein: 100 MG/DL — AB
Specific gravity: 1.009 (ref 1.003–1.030)
Urobilinogen: 0.2 EU/DL (ref 0.2–1.0)
pH (UA): 6 (ref 5.0–8.0)

## 2008-03-02 LAB — TROPONIN I: Troponin-I, Qt.: <0.04 ng/mL (ref 0.0–0.05)

## 2008-03-03 LAB — CULTURE, URINE
Colony Count: <1000
Culture result:: NO GROWTH

## 2009-02-13 NOTE — Telephone Encounter (Signed)
The patient's carotid doppler report is on your desk for review.

## 2009-02-14 NOTE — Telephone Encounter (Signed)
Mod blockage both sides. No change from before. Repeat 6mos

## 2009-02-15 NOTE — Telephone Encounter (Signed)
Left message on patient's answering machine for return call.

## 2009-02-17 NOTE — Telephone Encounter (Signed)
Letter sent to patient with results of carotids. Patient has not returned call. Attempted to call and could not reach.

## 2009-02-23 MED ORDER — ISOSORBIDE MONONITRATE SR 60 MG 24 HR TAB
60 mg | ORAL_TABLET | ORAL | Status: DC
Start: 2009-02-23 — End: 2009-10-26

## 2009-02-23 NOTE — Telephone Encounter (Signed)
Verbal order per Dr. Nelson

## 2009-03-31 MED ORDER — BUMETANIDE 2 MG TAB
2 mg | ORAL_TABLET | Freq: Every day | ORAL | Status: DC
Start: 2009-03-31 — End: 2009-04-03

## 2009-03-31 MED ORDER — POTASSIUM CHLORIDE SR 10 MEQ TAB, PARTICLES/CRYSTALS
10 mEq | ORAL_TABLET | Freq: Two times a day (BID) | ORAL | Status: DC
Start: 2009-03-31 — End: 2009-06-14

## 2009-03-31 NOTE — Progress Notes (Signed)
PCP - [facility]    History of Present Illness:  Gradually over the past few weeks Travis [name] has noted increasing SOB and edema.  He increased his lasix dose himself without improvement.  He feels his weight is up about 10 lbs.  He was at [facility] on 11/4 for hypoglycemia (Cr 1.7 and normal CXR).  He is here for further eval and treatment of edema/SOB.  He denies any worsening chest pain.  No orthopnea, lightheadedness, PND, or syncope.      Past Medical History   Diagnosis Date   ??? CAD (coronary artery disease)    ??? HTN (hypertension)    ??? PVD (peripheral vascular disease)    ??? DM (diabetes mellitus)    ??? Hyperlipidemia    ??? Sleep apnea      on CPAP   ??? COPD (chronic obstructive pulmonary disease)      mild COPD, recurrent pneumonia, chronic bronchitis (followed by Dr. Leroy Libman)   ??? CKD (chronic kidney disease)      Cr 1.7 in 10/09       Cardiac History:  Travis [name] has known coronary artery disease dating back to May 1999 when he underwent cardiac catheterization prior to planned peripheral vascular surgery. This showed distal disease in the apical LAD and right coronary artery with a normal ejection fraction.Non q MI in 7/06.cath showed mod LAD abn RCA disease up to 50-60% and a small occluded LCX with normal LVEF.     Prior Cardiac Eval:  Date of previous stress test was 02/09/07. The type of test was a dobutamine stress perfusion imaging. Cardiac stress testing was negative for chest pain and myocardial ischemia. Myocardial perfusion imaging showed reversible defect(s) involving the inferior wall. Gated EF (%): 55. Date of most recent echocardiogram was 01/19/07. Echocardiogram demonstrated normal LV wall motion and ejection fraction, LVH and left atrial enlargement. Carotid Dopplers in July 2009 showed bilateral 50-79% stenosis.    Current outpatient prescriptions   Medication Sig Dispense Refill   ??? terazosin (HYTRIN) 5 mg capsule Take 10 mg by mouth nightly.        ??? bumetanide (BUMEX) 2 mg tablet Take 1 Tab by mouth daily.  30 Tab  2   ??? potassium chloride (K-DUR, KLOR-CON) 10 mEq tablet Take 1 Tab by mouth two (2) times a day.  60 Tab  3   ??? isosorbide mononitrate ER (IMDUR) 60 mg CR tablet Take 1 Tab by mouth every morning.  30 Tab  6   ??? aspirin 81 mg tablet Take  by mouth daily.       ??? diltiazem CD (CARDIZEM CD) 240 mg ER capsule Take 240 mg by mouth two (2) times a day.       ??? omega-3 fatty acids-vitamin e (FISH OIL) 1,000 mg Cap Take  by mouth two (2) times a day.       ??? hydrALAZINE (APRESOLINE) 25 mg tablet Take 25 mg by mouth daily.       ??? insulin glargine (LANTUS) 100 unit/mL injection 42 Units by SubCUTAneous route once.       ??? levothyroxine (SYNTHROID) 150 mcg tablet Take  by mouth daily (before breakfast).       ??? lisinopril (PRINIVIL, ZESTRIL) 40 mg tablet Take 40 mg by mouth daily.       ??? METOPROLOL TARTRATE PO Take 100 mg by mouth two (2) times a day.       ??? nitroglycerin (NITROSTAT) 0.4 mg SL  tablet by SubLINGual route every five (5) minutes as needed.       ??? insulin aspart (NOVOLOG) 100 unit/mL injection by SubCUTAneous route.       ??? rosuvastatin (CRESTOR) 20 mg tablet Take 20 mg by mouth daily.       ??? ranitidine hcl (ZANTAC) 150 mg capsule Take 150 mg by mouth two (2) times a day.           No Known Allergies    Social History:  Patient does not smoke.  Denies problems with alcohol.    Review of Systems:  Constitutional: Negative for fever, chills, weight loss, malaise/fatigue and diaphoresis.   HEENT: Negative for nosebleeds, congestion, neck pain, tinnitus, and vision changes.   Respiratory: Negative for hemoptysis, sputum production, and wheezing.??   Gastrointestinal: Negative for nausea, vomiting, abdominal pain, diarrhea, constipation, blood in stool and melena.   Genitourinary: Negative for dysuria, urgency, frequency and hematuria.   Musculoskeletal: Negative for myalgias.   Skin: Negative for rash and itching.    Heme: Does not bleed or bruise easily.  Neurological: Negative for speech change and focal weakness.     Physical Exam:  BP 142/60   Pulse 77   Wt 207 lb (93.895 kg)   SpO2 94%  Patient appears generally well, mood and affect are appropriate and pleasant.  HEENT:  Normocephalic, atraumatic.   Neck Exam: Supple, Left carotid bruit.   Lung Exam: Clear to auscultation, even breath sounds.   Cardiac Exam: Regular rate and rhythm with no rub, or gallop. S1 and S2 are normal. No S3. 2/6 systolic murmur.  Abdomen: Soft, non-tender, normal bowel sounds. No bruits or masses.  Extremities: 2+ lower extremity edema.  Vascular: 2+ dorsalis pedis pulses bilaterally.    Assessment and Plan:  1) SOB and edema - Likely CHF.  Will check an echo (next week) to assess LV/RV function. Will switch the lasix over to Bumex (2mg  BID) to see if this helps with diuresis.    2) Kidney disease: His creatinine was 1.7 yesterday (unchanged c/w 10/09).  Will recheck labs on Monday.

## 2009-04-03 MED ORDER — BUMETANIDE 2 MG TAB
2 mg | ORAL_TABLET | Freq: Two times a day (BID) | ORAL | Status: DC
Start: 2009-04-03 — End: 2009-04-19

## 2009-04-03 NOTE — Progress Notes (Signed)
See scanned report.

## 2009-04-03 NOTE — Progress Notes (Signed)
PCP - [facility]    History of Present Illness:  Travis [name] feels much better.  He is urinating more and feels that his legs are less swollen and his breathing better.  He is taing his Bumex and KCL twice daily.  Denies lightheadedness, syncope, PND.  His weight apparently has not changed much, but he feels much better.      Past Medical History   Diagnosis Date   ??? CAD (coronary artery disease)    ??? HTN (hypertension)    ??? PVD (peripheral vascular disease)    ??? DM (diabetes mellitus)    ??? Hyperlipidemia    ??? Sleep apnea      on CPAP   ??? COPD (chronic obstructive pulmonary disease)      mild COPD, recurrent pneumonia, chronic bronchitis (followed by Dr. Leroy Libman)   ??? CKD (chronic kidney disease)      Cr 1.7 in 10/09       Cardiac History:   Travis [name] has known coronary artery disease dating back to May 1999 when he underwent cardiac catheterization prior to planned peripheral vascular surgery. This showed distal disease in the apical LAD and right coronary artery with a normal ejection fraction.Non q MI in 7/06.cath showed mod LAD abn RCA disease up to 50-60% and a small occluded LCX with normal LVEF.     Prior Cardiac Eval:   1) stress test 02/09/07. The type of test was a dobutamine stress perfusion imaging. Cardiac stress testing was negative for chest pain and myocardial ischemia. Myocardial perfusion imaging showed reversible defect(s) involving the inferior wall. Gated EF (%): 55.  2) echocardiogram 01/19/07. Echocardiogram demonstrated normal LV wall motion and ejection fraction, LVH and left atrial enlargement.   3) Carotid Dopplers in July 2009 showed bilateral 50-79% stenosis.  4) Echo 04/03/09 - TDS, moderate LVH, LVEF 65%; moderate LAE, RAE, mild AS (mean gradient 10), mild MR    Current outpatient prescriptions   Medication Sig Dispense Refill   ??? rosuvastatin (CRESTOR) 5 mg tablet Take  by mouth nightly.        ??? bumetanide (BUMEX) 2 mg tablet Take 1 Tab by mouth two (2) times a day.  30 Tab  2   ??? potassium chloride (K-DUR, KLOR-CON) 10 mEq tablet Take 1 Tab by mouth two (2) times a day.  60 Tab  3   ??? isosorbide mononitrate ER (IMDUR) 60 mg CR tablet Take 1 Tab by mouth every morning.  30 Tab  6   ??? aspirin 81 mg tablet Take  by mouth daily.       ??? diltiazem CD (CARDIZEM CD) 240 mg ER capsule Take 240 mg by mouth two (2) times a day.       ??? omega-3 fatty acids-vitamin e (FISH OIL) 1,000 mg Cap Take  by mouth two (2) times a day.       ??? hydrALAZINE (APRESOLINE) 25 mg tablet Take 25 mg by mouth daily.       ??? insulin glargine (LANTUS) 100 unit/mL injection 42 Units by SubCUTAneous route once.       ??? levothyroxine (SYNTHROID) 150 mcg tablet Take  by mouth daily (before breakfast).       ??? lisinopril (PRINIVIL, ZESTRIL) 40 mg tablet Take 40 mg by mouth daily.       ??? METOPROLOL TARTRATE PO Take 100 mg by mouth two (2) times a day.       ??? nitroglycerin (NITROSTAT) 0.4 mg SL  tablet by SubLINGual route every five (5) minutes as needed.       ??? insulin aspart (NOVOLOG) 100 unit/mL injection by SubCUTAneous route.       ??? ranitidine hcl (ZANTAC) 150 mg capsule Take 150 mg by mouth two (2) times a day.       ??? terazosin (HYTRIN) 5 mg capsule Take 10 mg by mouth nightly.           No Known Allergies    Social History:   Patient does not smoke. Denies problems with alcohol.     Review of Systems:   Constitutional: Negative for fever, chills, weight loss, malaise/fatigue and diaphoresis.   HEENT: Negative for nosebleeds, congestion, neck pain, tinnitus, and vision changes.   Respiratory: Negative for hemoptysis, sputum production, and wheezing.   Gastrointestinal: Negative for nausea, vomiting, abdominal pain, diarrhea, constipation, blood in stool and melena.   Genitourinary: Negative for dysuria, urgency, frequency and hematuria.   Musculoskeletal: Negative for myalgias.   Skin: Negative for rash and itching.    Heme: Does not bleed or bruise easily.   Neurological: Negative for speech change and focal weakness.     Physical Exam:   BP 160/60   Pulse 72 Wt 207  Patient appears generally well, mood and affect are appropriate and pleasant.   HEENT: Normocephalic, atraumatic.   Neck Exam: Supple, Left carotid bruit.   Lung Exam: Clear to auscultation, even breath sounds.   Cardiac Exam: Regular rate and rhythm with no rub, or gallop. S1 and S2 are normal. No S3. 2/6 systolic murmur.   Abdomen: Soft, non-tender, normal bowel sounds. No bruits or masses.   Extremities: 2+ lower extremity edema.   Vascular: 2+ dorsalis pedis pulses bilaterally.    Assessment and Plan:  1) CHF - diastolic heart failure; doing better with Bumex; will continue current regimen and reassess in 2 weeks.  2) CKD - recheck BMP today

## 2009-04-04 LAB — METABOLIC PANEL, COMPREHENSIVE
A-G Ratio: 0.8 — ABNORMAL LOW (ref 1.1–2.2)
ALT (SGPT): 18 U/L (ref 12–78)
AST (SGOT): 22 U/L (ref 15–37)
Albumin: 3.2 g/dL — ABNORMAL LOW (ref 3.5–5.0)
Alk. phosphatase: 67 U/L (ref 50–136)
Anion gap: 12 mmol/L (ref 5–15)
BUN/Creatinine ratio: 18 (ref 12–20)
BUN: 36 MG/DL — ABNORMAL HIGH (ref 6–20)
Bilirubin, total: 0.3 MG/DL (ref 0.2–1.0)
CO2: 29 MMOL/L (ref 21–32)
Calcium: 8.5 MG/DL (ref 8.5–10.1)
Chloride: 101 MMOL/L (ref 97–108)
Creatinine: 2 MG/DL — ABNORMAL HIGH (ref 0.6–1.3)
GFR est AA: 43 mL/min/{1.73_m2} — ABNORMAL LOW (ref >60)
GFR est non-AA: 35 mL/min/{1.73_m2} — ABNORMAL LOW (ref >60)
Globulin: 4 g/dL (ref 2.0–4.0)
Glucose: 166 MG/DL — ABNORMAL HIGH (ref 65–100)
Potassium: 4 MMOL/L (ref 3.5–5.1)
Protein, total: 7.2 g/dL (ref 6.4–8.2)
Sodium: 142 MMOL/L (ref 136–145)

## 2009-04-04 LAB — MAGNESIUM: Magnesium: 1.8 MG/DL (ref 1.6–2.4)

## 2009-04-04 NOTE — Progress Notes (Signed)
Quick Note:    Can you let him know that labs are stable, although Cr up a little. Can you have him cut back his bumex to 2 mg in AM and 1 mg (just half a tablet) in the afternoon starting in Thursday.  Thanks,  sk  ______

## 2009-04-04 NOTE — Progress Notes (Signed)
L/m for call back tomorrow.

## 2009-04-05 NOTE — Progress Notes (Signed)
Quick Note:    I was able to reach pt today about his med changes. He understands.  ______

## 2009-04-19 MED ORDER — BUMETANIDE 2 MG TAB
2 mg | ORAL_TABLET | Freq: Two times a day (BID) | ORAL | Status: DC
Start: 2009-04-19 — End: 2009-06-14

## 2009-04-19 NOTE — Progress Notes (Signed)
PCP - Dr. Alphonsus Sias    History of Present Illness:  He feels much better since a few weeks ago.  His says his edema and breathing are better and breathing back to baseline and leg swelling better than usual.  Overall he feels pretty good. No problems with chest pain or orthopnea.    Past Medical History   Diagnosis Date   ??? CAD (coronary artery disease)      see problem list   ??? HTN (hypertension)    ??? PVD (peripheral vascular disease)      moderate carotid artery dz    ??? DM (diabetes mellitus)    ??? Hyperlipidemia    ??? Sleep apnea      on CPAP   ??? COPD (chronic obstructive pulmonary disease)      mild COPD, recurrent pneumonia, chronic bronchitis (followed by Dr. Leroy Libman)   ??? CKD (chronic kidney disease)      Cr 1.7 in 10/09   ??? CHF (congestive heart failure)        Problem List Date Reviewed: 04/23/2009      Class    CHF (congestive heart failure) [428.0R]     Overview     Preserved LV function          CAD (coronary artery disease) [414.00AE]     Overview     Cardiac History:   Travis [name] has known coronary artery disease dating back to May 1999 when he underwent cardiac catheterization prior to planned peripheral vascular surgery. This showed distal disease in the apical LAD and right coronary artery with a normal ejection fraction.Non q MI in 7/06.cath showed mod LAD abn RCA disease up to 50-60% and a small occluded LCX with normal LVEF.     Prior Cardiac Eval:   1) stress test 02/09/07. The type of test was a dobutamine stress perfusion imaging. Cardiac stress testing was negative for chest pain and myocardial ischemia. Myocardial perfusion imaging showed reversible defect(s) involving the inferior wall. Gated EF (%): 55.   2) echocardiogram 01/19/07. Echocardiogram demonstrated normal LV wall motion and ejection fraction, LVH and left atrial enlargement.   3) Carotid Dopplers in July 2009 showed bilateral 50-79% stenosis.   4) Carotids 07/08/08 - 50-79% stenosis bilat   5) Echo 04/03/09 - TDS, moderate LVH, LVEF 65%; moderate LAE, RAE, mild AS (mean gradient 10), mild MR                  Current outpatient prescriptions   Medication Sig Dispense Refill   ??? bumetanide (BUMEX) 2 mg tablet Take 1 Tab by mouth two (2) times a day.  60 Tab  6   ??? rosuvastatin (CRESTOR) 5 mg tablet Take  by mouth nightly.       ??? terazosin (HYTRIN) 5 mg capsule Take 10 mg by mouth nightly.       ??? potassium chloride (K-DUR, KLOR-CON) 10 mEq tablet Take 1 Tab by mouth two (2) times a day.  60 Tab  3   ??? isosorbide mononitrate ER (IMDUR) 60 mg CR tablet Take 1 Tab by mouth every morning.  30 Tab  6   ??? aspirin 81 mg tablet Take  by mouth daily.       ??? diltiazem CD (CARDIZEM CD) 240 mg ER capsule Take 240 mg by mouth two (2) times a day.       ??? omega-3 fatty acids-vitamin e (FISH OIL) 1,000 mg Cap Take  by mouth two (2) times  a day.       ??? hydrALAZINE (APRESOLINE) 25 mg tablet Take 25 mg by mouth two (2) times a day.       ??? insulin glargine (LANTUS) 100 unit/mL injection 42 Units by SubCUTAneous route once.       ??? levothyroxine (SYNTHROID) 150 mcg tablet Take  by mouth daily (before breakfast).       ??? lisinopril (PRINIVIL, ZESTRIL) 40 mg tablet Take 40 mg by mouth daily.       ??? METOPROLOL TARTRATE PO Take 100 mg by mouth two (2) times a day.       ??? nitroglycerin (NITROSTAT) 0.4 mg SL tablet by SubLINGual route every five (5) minutes as needed.       ??? insulin aspart (NOVOLOG) 100 unit/mL injection by SubCUTAneous route.       ??? ranitidine hcl (ZANTAC) 150 mg capsule Take 150 mg by mouth two (2) times a day.         Social History:  Patient does not smoke.  Denies problems with alcohol.    Review of Systems:   Constitutional: Negative for fever, chills, weight loss, malaise/fatigue and diaphoresis.   HEENT: Negative for nosebleeds, congestion, neck pain, tinnitus, and vision changes.   Respiratory: Negative for hemoptysis, sputum production, and wheezing.    Gastrointestinal: Negative for nausea, vomiting, abdominal pain, diarrhea, constipation, blood in stool and melena.   Genitourinary: Negative for dysuria, urgency, frequency and hematuria.   Musculoskeletal: Negative for myalgias.   Skin: Negative for rash and itching.   Heme: Does not bleed or bruise easily.   Neurological: Negative for speech change and focal weakness.     Physical Exam:   BP 138/60   Pulse 72   Wt 207 lb (93.895 kg)   Patient appears generally well, mood and affect are appropriate and pleasant.   HEENT: Normocephalic, atraumatic.   Neck Exam: Supple, Left carotid bruit.   Lung Exam: Clear to auscultation, even breath sounds.   Cardiac Exam: Regular rate and rhythm with no rub, or gallop. S1 and S2 are normal. No S3. 2/6 systolic murmur.   Abdomen: Soft, non-tender, normal bowel sounds. No bruits or masses.   Extremities: 2+ lower extremity edema.   Vascular: 2+ dorsalis pedis pulses bilaterally.    Assessment and Plan:  1. Diastolic heart failure - clinically compensated; continue bumex 2 bid.  FU BMP and BNP level.  He feels volume status much better, although weight has not gone down any.  He feels his edema is better.    2. CKD - refer to renal for initial visit    3. CAD - no unstable symptoms; cont meds    4. Moderate carotid artery disease - follow with yearly carotids; recheck next year

## 2009-04-30 LAB — METABOLIC PANEL, BASIC
BUN/Creatinine ratio: 20 (ref 8–27)
BUN: 37 mg/dL — ABNORMAL HIGH (ref 5–26)
CO2: 30 mmol/L (ref 20–32)
Calcium: 9.3 mg/dL (ref 8.6–10.2)
Chloride: 98 mmol/L (ref 97–108)
Creatinine: 1.82 mg/dL — ABNORMAL HIGH (ref 0.76–1.27)
GFR est AA: 45 mL/min/{1.73_m2} — ABNORMAL LOW (ref >59)
GFR est non-AA: 37 mL/min/{1.73_m2} — ABNORMAL LOW (ref >59)
Glucose: 251 mg/dL — ABNORMAL HIGH (ref 65–99)
Potassium: 4.1 mmol/L (ref 3.5–5.2)
Sodium: 141 mmol/L (ref 135–145)

## 2009-04-30 LAB — BNP: B-type Natriuretic Peptide: 79.3 pg/mL (ref 0.0–100.0)

## 2009-05-07 NOTE — Progress Notes (Addendum)
Quick Note:    Can you please send Travis [name] a copy of his labs. Renal function is abnormal, but stable - he needs to see a Nephrologist. His BNP is normal so he is likely compensated from a heart failure standpoint. Will see in clinic as planned in a few months.  Thanks, SK  ______

## 2009-05-08 NOTE — Progress Notes (Addendum)
Quick Note:    Results mailed,  ______

## 2009-05-31 LAB — METABOLIC PANEL, COMPREHENSIVE
A-G Ratio: 0.5 — ABNORMAL LOW (ref 1.1–2.2)
ALT (SGPT): 25 U/L (ref 12–78)
AST (SGOT): 33 U/L (ref 15–37)
Albumin: 2.6 g/dL — ABNORMAL LOW (ref 3.5–5.0)
Alk. phosphatase: 80 U/L (ref 50–136)
Anion gap: 12 mmol/L (ref 5–15)
BUN/Creatinine ratio: 26 — ABNORMAL HIGH (ref 12–20)
BUN: 74 MG/DL — ABNORMAL HIGH (ref 6–20)
Bilirubin, total: 0.8 MG/DL (ref 0.2–1.0)
CO2: 27 MMOL/L (ref 21–32)
Calcium: 8.4 MG/DL — ABNORMAL LOW (ref 8.5–10.1)
Chloride: 95 MMOL/L — ABNORMAL LOW (ref 97–108)
Creatinine: 2.8 MG/DL — ABNORMAL HIGH (ref 0.6–1.3)
GFR est AA: 29 mL/min/{1.73_m2} — ABNORMAL LOW (ref >60)
GFR est non-AA: 24 mL/min/{1.73_m2} — ABNORMAL LOW (ref >60)
Globulin: 5 g/dL — ABNORMAL HIGH (ref 2.0–4.0)
Glucose: 336 MG/DL — ABNORMAL HIGH (ref 65–100)
Potassium: 4.1 MMOL/L (ref 3.5–5.1)
Protein, total: 7.6 g/dL (ref 6.4–8.2)
Sodium: 134 MMOL/L — ABNORMAL LOW (ref 136–145)

## 2009-05-31 LAB — MAGNESIUM
Magnesium: 2.4 MG/DL (ref 1.6–2.4)
Magnesium: 2.5 MG/DL — ABNORMAL HIGH (ref 1.6–2.4)
Magnesium: 2.3 MG/DL (ref 1.6–2.4)

## 2009-05-31 LAB — CBC WITH AUTOMATED DIFF
ABS. BASOPHILS: 0 10*3/uL (ref 0.0–0.1)
ABS. EOSINOPHILS: 0 10*3/uL (ref 0.0–0.4)
ABS. LYMPHOCYTES: 1.9 10*3/uL (ref 0.8–3.5)
ABS. MONOCYTES: 0.8 10*3/uL (ref 0.0–1.0)
ABS. NEUTROPHILS: 7.3 10*3/uL (ref 1.8–8.0)
BASOPHILS: 0 % (ref 0–1)
EOSINOPHILS: 0 % (ref 0–7)
HCT: 31.3 % — ABNORMAL LOW (ref 36.6–50.3)
HGB: 10.6 g/dL — ABNORMAL LOW (ref 12.1–17.0)
LYMPHOCYTES: 19 % (ref 12–49)
MCH: 29.6 PG (ref 26.0–34.0)
MCHC: 33.9 g/dL (ref 30.0–36.5)
MCV: 87.4 FL (ref 80.0–99.0)
MONOCYTES: 8 % (ref 5–13)
NEUTROPHILS: 73 % (ref 32–75)
PLATELET: 131 10*3/uL — ABNORMAL LOW (ref 150–400)
RBC: 3.58 M/uL — ABNORMAL LOW (ref 4.10–5.70)
RDW: 16.8 % — ABNORMAL HIGH (ref 11.5–14.5)
WBC: 9.9 10*3/uL (ref 4.1–11.1)

## 2009-05-31 LAB — POC CHEM8
Anion gap (POC): 13 (ref 5–15)
Anion gap (POC): 11 (ref 5–15)
BUN (POC): 61 MG/DL — ABNORMAL HIGH (ref 9–20)
BUN (POC): 65 MG/DL — ABNORMAL HIGH (ref 9–20)
CO2 (POC): 23 MMOL/L (ref 21–32)
CO2 (POC): 27 MMOL/L (ref 21–32)
Calcium, ionized (POC): 0.97 MMOL/L — ABNORMAL LOW (ref 1.12–1.32)
Calcium, ionized (POC): 1.01 MMOL/L — ABNORMAL LOW (ref 1.12–1.32)
Chloride (POC): 97 MMOL/L — ABNORMAL LOW (ref 98–107)
Chloride (POC): 98 MMOL/L (ref 98–107)
Creatinine (POC): 2.4 MG/DL — ABNORMAL HIGH (ref 0.6–1.3)
Creatinine (POC): 2.8 MG/DL — ABNORMAL HIGH (ref 0.6–1.3)
GFRAA, POC: 35 mL/min/{1.73_m2} — ABNORMAL LOW (ref >60)
GFRAA, POC: 29 mL/min/{1.73_m2} — ABNORMAL LOW (ref >60)
GFRNA, POC: 29 mL/min/{1.73_m2} — ABNORMAL LOW (ref >60)
GFRNA, POC: 24 mL/min/{1.73_m2} — ABNORMAL LOW (ref >60)
Glucose (POC): >700 MG/DL — CR (ref 75–110)
Glucose (POC): 595 MG/DL — ABNORMAL HIGH (ref 75–110)
Hematocrit (POC): 33 % — ABNORMAL LOW (ref 36.6–50.3)
Hematocrit (POC): 27 % — ABNORMAL LOW (ref 36.6–50.3)
Hemoglobin (POC): 11.2 GM/DL — ABNORMAL LOW (ref 12.1–17.0)
Hemoglobin (POC): 9.2 GM/DL — ABNORMAL LOW (ref 12.1–17.0)
Potassium (POC): 5 MMOL/L (ref 3.5–5.1)
Potassium (POC): 4.4 MMOL/L (ref 3.5–5.1)
Sodium (POC): 128 MMOL/L — ABNORMAL LOW (ref 137–145)
Sodium (POC): 131 MMOL/L — ABNORMAL LOW (ref 137–145)

## 2009-05-31 LAB — PTH INTACT
Calcium: 8.2 MG/DL — ABNORMAL LOW (ref 8.5–10.1)
PTH, Intact: 71 pg/mL (ref 14.0–72.0)

## 2009-05-31 LAB — RENAL FUNCTION PANEL
Albumin: 2.9 g/dL — ABNORMAL LOW (ref 3.5–5.0)
Anion gap: 11 mmol/L (ref 5–15)
BUN/Creatinine ratio: 22 — ABNORMAL HIGH (ref 12–20)
BUN: 73 MG/DL — ABNORMAL HIGH (ref 6–20)
CO2: 28 MMOL/L (ref 21–32)
Calcium: 8.7 MG/DL (ref 8.5–10.1)
Chloride: 97 MMOL/L (ref 97–108)
Creatinine: 3.3 MG/DL — ABNORMAL HIGH (ref 0.6–1.3)
GFR est AA: 24 mL/min/{1.73_m2} — ABNORMAL LOW (ref >60)
GFR est non-AA: 20 mL/min/{1.73_m2} — ABNORMAL LOW (ref >60)
Glucose: 237 MG/DL — ABNORMAL HIGH (ref 65–100)
Phosphorus: 2.8 MG/DL (ref 2.5–4.9)
Potassium: 4.1 MMOL/L (ref 3.5–5.1)
Sodium: 136 MMOL/L (ref 136–145)

## 2009-05-31 LAB — HEPATIC FUNCTION PANEL
A-G Ratio: 0.6 — ABNORMAL LOW (ref 1.1–2.2)
ALT (SGPT): 27 U/L (ref 12–78)
AST (SGOT): 39 U/L — ABNORMAL HIGH (ref 15–37)
Albumin: 3.2 g/dL — ABNORMAL LOW (ref 3.5–5.0)
Alk. phosphatase: 93 U/L (ref 50–136)
Bilirubin, direct: 0.3 MG/DL — ABNORMAL HIGH (ref 0.0–0.2)
Bilirubin, total: 1.2 MG/DL — ABNORMAL HIGH (ref 0.2–1.0)
Globulin: 5.7 g/dL — ABNORMAL HIGH (ref 2.0–4.0)
Protein, total: 8.9 g/dL — ABNORMAL HIGH (ref 6.4–8.2)

## 2009-05-31 LAB — URINALYSIS W/ REFLEX CULTURE
Bacteria: NEGATIVE /HPF
Bilirubin: NEGATIVE
Glucose: >1000 MG/DL — AB
Ketone: NEGATIVE MG/DL
Leukocyte Esterase: NEGATIVE
Nitrites: NEGATIVE
Protein: 30 MG/DL — AB
Specific gravity: 1.016 (ref 1.003–1.030)
Urobilinogen: 0.2 EU/DL (ref 0.2–1.0)
pH (UA): 5 (ref 5.0–8.0)

## 2009-05-31 LAB — IRON PROFILE
Iron % saturation: 13 % — ABNORMAL LOW (ref 20–50)
Iron: 25 ug/dL — ABNORMAL LOW (ref 35–150)
TIBC: 195 ug/dL — ABNORMAL LOW (ref 250–450)

## 2009-05-31 LAB — BLOOD GAS, ARTERIAL
BASE EXCESS: 5.6 mmol/L — AB
BICARBONATE: 29 mmol/L — ABNORMAL HIGH (ref 22–26)
O2 FLOW RATE: 2 L/min
O2 SAT: 93 % (ref 92–97)
PCO2: 36 mmHg (ref 35–45)
PO2: 60 mmHg — ABNORMAL LOW (ref 80–100)
pH: 7.52 — ABNORMAL HIGH (ref 7.35–7.45)

## 2009-05-31 LAB — METABOLIC PANEL, BASIC
Anion gap: 13 mmol/L (ref 5–15)
Anion gap: 7 mmol/L (ref 5–15)
BUN/Creatinine ratio: 23 — ABNORMAL HIGH (ref 12–20)
BUN/Creatinine ratio: 23 — ABNORMAL HIGH (ref 12–20)
BUN: 68 MG/DL — ABNORMAL HIGH (ref 6–20)
BUN: 75 MG/DL — ABNORMAL HIGH (ref 6–20)
CO2: 25 MMOL/L (ref 21–32)
CO2: 29 MMOL/L (ref 21–32)
Calcium: 8.6 MG/DL (ref 8.5–10.1)
Calcium: 8.6 MG/DL (ref 8.5–10.1)
Chloride: 89 MMOL/L — ABNORMAL LOW (ref 97–108)
Chloride: 98 MMOL/L (ref 97–108)
Creatinine: 2.9 MG/DL — ABNORMAL HIGH (ref 0.6–1.3)
Creatinine: 3.2 MG/DL — ABNORMAL HIGH (ref 0.6–1.3)
GFR est AA: 28 mL/min/{1.73_m2} — ABNORMAL LOW (ref >60)
GFR est AA: 25 mL/min/{1.73_m2} — ABNORMAL LOW (ref >60)
GFR est non-AA: 23 mL/min/{1.73_m2} — ABNORMAL LOW (ref >60)
GFR est non-AA: 21 mL/min/{1.73_m2} — ABNORMAL LOW (ref >60)
Glucose: 788 MG/DL — CR (ref 65–100)
Glucose: 250 MG/DL — ABNORMAL HIGH (ref 65–100)
Potassium: 5 MMOL/L (ref 3.5–5.1)
Potassium: 4 MMOL/L (ref 3.5–5.1)
Sodium: 127 MMOL/L — ABNORMAL LOW (ref 136–145)
Sodium: 134 MMOL/L — ABNORMAL LOW (ref 136–145)

## 2009-05-31 LAB — GLUCOSE, POC
Glucose (POC): 536 mg/dL — ABNORMAL HIGH (ref 75–110)
Glucose (POC): 375 mg/dL — ABNORMAL HIGH (ref 75–110)
Glucose (POC): 263 mg/dL — ABNORMAL HIGH (ref 75–110)
Glucose (POC): 221 mg/dL — ABNORMAL HIGH (ref 75–110)
Glucose (POC): 160 mg/dL — ABNORMAL HIGH (ref 75–110)
Glucose (POC): 140 mg/dL — ABNORMAL HIGH (ref 75–110)
Glucose (POC): >600 mg/dL — CR (ref 75–110)
Glucose (POC): >600 mg/dL — CR (ref 75–110)
Glucose (POC): 332 mg/dL — ABNORMAL HIGH (ref 75–110)
Glucose (POC): 356 mg/dL — ABNORMAL HIGH (ref 75–110)

## 2009-05-31 LAB — CK-MB,QUANT.
CK - MB: 13.4 NG/ML — ABNORMAL HIGH (ref 0.5–3.6)
CK-MB Index: 3.8 — ABNORMAL HIGH (ref 0–2.5)

## 2009-05-31 LAB — LIPID PANEL
CHOL/HDL Ratio: 1.8 (ref 0–5.0)
Cholesterol, total: 119 MG/DL (ref <200)
HDL Cholesterol: 66 MG/DL
LDL, calculated: 42.2 MG/DL (ref 0–100)
Triglyceride: 54 MG/DL (ref <150)
VLDL, calculated: 10.8 MG/DL

## 2009-05-31 LAB — CK W/ CKMB & INDEX
CK - MB: 7.3 NG/ML — ABNORMAL HIGH (ref 0.5–3.6)
CK - MB: 8 NG/ML — ABNORMAL HIGH (ref 0.5–3.6)
CK-MB Index: 2.1 (ref 0–2.5)
CK-MB Index: 2 (ref 0–2.5)
CK: 341 U/L — ABNORMAL HIGH (ref 35–232)
CK: 397 U/L — ABNORMAL HIGH (ref 35–232)

## 2009-05-31 LAB — HEMOGLOBIN A1C WITH EAG: Hemoglobin A1c: 9.5 % — ABNORMAL HIGH (ref 4.8–6.0)

## 2009-05-31 LAB — FERRITIN: Ferritin: 160 NG/ML (ref 26–388)

## 2009-05-31 LAB — TROPONIN I
Troponin-I, Qt.: 4.85 ng/mL — ABNORMAL HIGH (ref <0.05)
Troponin-I, Qt.: 6.82 ng/mL — ABNORMAL HIGH (ref <0.05)

## 2009-05-31 LAB — NT-PRO BNP: NT pro-BNP: 10167 PG/ML — ABNORMAL HIGH (ref 0–125)

## 2009-05-31 LAB — TSH 3RD GENERATION: TSH: 2.2 u[IU]/mL (ref 0.36–3.74)

## 2009-05-31 LAB — POTASSIUM: Potassium: 4 MMOL/L (ref 3.5–5.1)

## 2009-05-31 LAB — IRON: Iron: 30 ug/dL — ABNORMAL LOW (ref 35–150)

## 2009-05-31 LAB — POC TROPONIN-I: Troponin-I (POC): 3.52 ng/mL — ABNORMAL HIGH (ref 0.00–0.08)

## 2009-05-31 LAB — CK W/ REFLX CKMB: CK: 353 U/L — ABNORMAL HIGH (ref 35–232)

## 2009-05-31 NOTE — H&P (Addendum)
Name: Travis [name], Travis [name] Admitted: 05/31/2009  MR #: [id number] DOB: 1940-10-24  Account #: [id number] Age: 69  Physician: Althia Forts, M.D. Location: [id number]     HISTORY PHYSICAL      ADMISSION SERVICE: Dr. Kerin Salen, Hospitalist Service.    PRIMARY CARE PHYSICIAN: [facility].    CARDIOLOGIST: Dr. Welton Flakes.    CHIEF COMPLAINT: Shortness of breath and weakness.    HISTORY OF PRESENT ILLNESS: The patient is a 69 year old gentleman with a  known history of coronary artery disease, and congestive heart failure. He  is brought in by his family, as he has been complaining of shortness of  breath, and is becoming progressively weaker. Over the last several days he  has become progressively more short of breath, with a cough productive of  yellow sputum, and some wheezing. He denies any chest pain, or lower  extremity edema. He is not aware of any fevers or chills. He has not had  any nausea or vomiting.    PAST MEDICAL HISTORY  1. Hypertension.  2. Type 2 diabetes.  3. 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  4. Coronary artery disease, status post distant MI.  5. Diastolic congestive heart failure. An echo in 2008 showed LVH and an EF  of 55%.  6. Peripheral vascular disease.  7. COPD.  8. Hypothyroidism.  9. Chronic normocytic anemia, baseline hemoglobin of 10 to 11.  10. Benign prostatic hypertrophy.  11. Past history of MRSA skin infections.  12. Osteoarthritis.    SURGICAL HISTORY  1. Right total knee replacement.  2. Vascular bypass of the right leg.    MEDICATIONS  1. Aspirin 81 mg daily  2. Diltiazem CD 240 mg daily  3. Hydralazine 25 mg twice a day.  4. Imdur 60 mg daily.  5. Lisinopril 40 mg daily.  6. Metoprolol tartrate 100 mg daily.  7. Bumex 2 mg daily.  8. Rosuvastatin 20 mg at bedtime  9. Fish oil twice a day  10. Levothyroxine 150 mcg daily.  11. Lantus 42 units subcu in the morning.  12. Zantac 150 mg daily.     ALLERGIES: HE HAS NO KNOWN DRUG ALLERGIES.    FAMILY HISTORY: The patient states his parents are deceased and he does  not know their medical histories.    SOCIAL HISTORY: The patient is retired from the [employer]. He  stopped smoking more than 30 years ago, and is not a regular drinker of  alcohol.    REVIEW OF SYSTEMS  CONSTITUTIONAL: No weight loss. No fevers or chills.  EYES: He wears glasses.  ENT: No hearing loss.  RESPIRATORY: Recent cough and shortness of breath.  CARDIAC: No chest pain or palpitations.  GASTROINTESTINAL: No nausea or vomiting.  GENITOURINARY: No dysuria or frequency.  MUSCULOSKELETAL: No current joint or back pain.  NEUROLOGIC: No headache or focal weakness. According to his family he  seems a little confused.  INTEGUMENTARY: No rashes.  HEMATOLOGIC: No bruising, bleeding, or history of blood clots.  ENDOCRINE: He has not been taking his Lantus.    PHYSICAL EXAMINATION  VITAL SIGNS: On arrival, blood pressure 190/81, pulse 71, respirations 22,  temp 98.9, O2 saturation was 89% on room air.  GENERAL: He is lying on a stretcher, awake, alert, well groomed, well  nourished. He appears short of breath.  HEENT: Pupils are equal, round, and reactive to light. Extraocular motions  are intact. Oropharynx  has good dentition. Dry mucosa. No exudates.  NECK: Supple. No nodes or thyromegaly. I cannot tell if he has any  bruits.  LUNGS: Coarse crackles at the bases. Expiratory wheezes. He has 1+  accessory muscle use.  HEART: Regular rate and rhythm. No murmur, rub, or thrill.  ABDOMEN: Soft, nontender, nondistended. Bowel sounds are present.  EXTREMITIES: Pulses in the feet are faint.  SKIN: Turgor is intact. No edema.  NEUROLOGIC: He is awake, answers questions, and follows commands. He is  oriented to the fact that he is in the hospital, but when asked the date,  he says that it is Christmas. He did not know the year. He has no focal  motor deficits and no tremor.     ADMISSION LABS: Sodium 128, potassium 5.0, chloride 97, CO2 23, glucose  was greater than 700. BUN 61, creatinine 2.4. It was 1.7 in 2009. Initial  troponin 4.85. A repeat was 3.52. ProBNP was 10,000. Repeat electrolytes  after 3 hours, sodium 131, potassium 4.4, chloride 98, CO2 27, glucose 595,  BUN 65, creatinine 2.8, white blood cell count is 9.9, hemoglobin 10.6,  hematocrit 31, 131,000 platelets. A chest x-ray shows diffuse bilateral  interstitial edema, no obvious infiltrate. An ABG showed a pH of 7.51, PCO2  of 36, and PO2 of 60.    DIAGNOSTIC STUDIES: An EKG is normal sinus rhythm at a rate of 85. A UA is  negative for white cells or bacteria. Coags are pending. A head CT is  pending.    IMPRESSION  1. Decompensated, diastolic, congestive heart failure. The cause of this is  unclear, and may be due to cardiac ischemia.  2. Known history of coronary artery disease. Currently troponin is  elevated. Unknown whether this represents a non-ST elevation myocardial  infarction, or whether it is elevated due to a combination of his  congestive heart failure and renal failure.  3. Worsened chronic kidney disease, recent baseline is unknown. There may  have been an element of urinary retention, as when Foley catheter was  placed in the emergency department, 500 mL of urine came out.  4. Diabetic, nonketotic, hyperosmolar state.  5. Altered mental status. The family believes he is slightly confused  compared to his baseline. His exam confirms this, but is nonfocal.  6. Recent cough productive of yellow sputum. I cannot rule out an  underlying pneumonia.  7. Possible component of chronic obstructive pulmonary disease  exacerbation.  8. Hypothyroidism, on replacement.  9. Stable normocytic anemia.    PLAN  1. Admit to the Intensive Care Unit.  2. He has been treated with Lasix in the emergency department, and is on a  nitroglycerin drip. Cardiology has started full dose Lovenox  anticoagulation.   3. Rule out myocardial infarction with serial enzymes and EKGs.  4. Echocardiogram in the morning.  5. Due to his renal failure, we are going to hold his ACE inhibitor for now  and obtain a Nephrology consult in the morning. We will use hydralazine for  afterload reduction.  6. Send sputum for culture and start Rocephin for possible bronchitis  and/or pneumonia.  7. Start inhaled Xopenex, Advair, and Spiriva.  8. Glucose control currently with an insulin drip. When his blood sugar  gets to 180 or below, we will start his subcu Lantus, and reduce the dose  due to his kidney function.  9. Check a TSH, a lipid profile, a hemoglobin A1c, and follow his magnesium  closely.  10. Dr.  Doloresco has kindly seen the patient in the emergency department,  and Dr. Welton Flakes will follow up in the morning.    TIME SPENT WITH PATIENT: Seventy minutes spent in preparation.        Reviewed on 05/31/2009 7:52 AM        E-Signed By  Althia Forts, M.D. 05/31/2009 20:09    Althia Forts, M.D.    cc: Althia Forts, M.D.        LTM/WMX; D: 05/31/2009 6:26 A; T: 05/31/2009 7:10 A; DOC# [id number]; Job#  [id number]

## 2009-05-31 NOTE — Consults (Addendum)
Name: Travis [name], EDMONSTON Admitted: 05/31/2009  MR #: [id number] DOB: 09-02-1940  Account #: [id number] Age: 69  Consultant: Rayburn Felt, M.D. Location: [id number]     CONSULTATION REPORT    DATE OF CONSULTATION: 05/30/2009      CARDIOLOGIST: Tharon Aquas, MD    PRIMARY CARE PHYSICIAN: At the [facility]    ENDOCRINOLOGIST: ________    NEPHROLOGIST: Soon to be Dr. Sherrine Maples.    REASON FOR CONSULTATION: We were requested to see the patient regarding  shortness of breath, and chest discomfort.    HISTORY OF PRESENT ILLNESS: The patient is a 69 year old gentleman, with  history significant for coronary artery disease with history of MI in the  past, with significant coronary artery disease with an occluded circumflex,  apical LAD disease and RCA lesion of 50% to 60% with normal LV function,  hypertension, diabetes for 20 to 30 years, hyperlipidemia and recent  worsening renal failure with a creatinine on the 10th of November of 2.  Currently 2.9.    He was admitted after, on Sunday, experiencing some chest discomfort. He  said he took a nitroglycerin for it. It was across his precordial area. He  was noticing worsening shortness of breath. He has some progression of  lower extremity edema and orthopnea sitting nearly upright in order to  sleep. Might also notice some increasing abdominal girth. He was actually  feeling somewhat nauseated, and yesterday took none of his medicines,  including his insulin, because he basically states he was unable to eat. Of  note, he did take nitroglycerin on Sunday, and that was the only time he  has taken it in quite some time, and noticed relief with 1. He has had some  postprandial discomfort and symptoms of like indigestion. He did have some  dietary indiscretion over the holidays by eating pie, ham and [place].    His last echo in 2008 showed LVH left atrial enlargement, normal LV  function. His last catheterization was in 2006.     His creatinine in the past has been 1.3 to 1.9. Currently it is 2.9.    ALLERGIES: NO KNOWN DRUG ALLERGIES.    RISK FACTORS: Significant for dyslipidemia, hypertension, diabetes, male  gender. He denies any smoking.    PAST MEDICAL AND SURGICAL HISTORY  1. Chronic renal insufficiency, usual range is 1.3 to 1.9.  2. Coronary artery disease with history of MI.  3. Hypertension.  4. Diabetes for 20 or 30 years, on insulin, maybe a little less than that  with no neuropathy, positive nephropathy, question of retinopathy.  5. Hyperlipidemia.  6. Hypothyroidism.  7. BPH.  8. History of MRSA.  9. Peripheral vascular disease, bypass to his right leg.  10. Total knee replacement in the past.  11. Cataract surgery.    SOCIAL HISTORY: He lives at home with his son. He is widowed. He has also  a daughter.    MEDICATIONS  1. Aspirin 81 mg a day.  2. Diltiazem 240 a day.  3. Fish oil b.i.d.  4. Bumex 2 mg b.i.d.  5. Hydralazine 25 b.i.d.  6. Imdur 60 mg daily.  7. Lantus.  8. Levothyroxine 150 mcg.  9. Lisinopril 40 mg a day.  10. Metoprolol tartrate 100 b.i.d.  11. Atorvastatin 20 mg daily.  12. Zantac.  13. Novolin.    PHYSICAL EXAMINATION  GENERAL: He was an obese gentleman, cooperative, in no acute distress,  with hiccups.  VITAL SIGNS: Blood pressure is 190/60, pulse  is 71.  HEENT: Eyes: No scleral icterus. Muddy sclerae. Teeth: Poor dentition.  Mucosa moist.  NECK: No JVD, but he does have a slight faint bruits bilaterally. I could  not feel any adenopathy. Neck is rather thick.  LUNGS: Possibly some crackles at his bases bilaterally but poor  respiratory effort.  HEART: There are periods of irregularity, and his heart rhythm is rather  distant. I do hear a slight 2/6 systolic murmur along the left sternal  border, possibly at the apex as well, but again it is difficult to tell  secondary to his large chest.  ABDOMEN: Morbidly obese, protuberant, nontender. Active.   EXTREMITIES: Right leg has 1+ pitting edema as does his left. Right leg  has chronic changes of venous insufficiency and arterial insufficiency. He  does have a scar from his bypass to his right leg.    DIAGNOSTIC DATA: H and H is 10.6 and 31, platelet count 131, white count  9.9. Sodium 127, potassium 5, BUN 69, creatinine 2.9, glucose 788.    EKG showed a sinus rhythm with what appears to be possibly Mobitz 1. He  does have T-waves that are upright laterally, where they were down before.  His CPK is 353 with a percent of 3.8, troponin was 4.85 and BNP was  10,167.    IMPRESSION  1. Positive troponin suggestive of a myocardial infarction, possibly  inferior.  2. Congestive heart failure, BNP 10,167.  3. Chronic renal insufficiency, acute on chronic.  4. Hypertension.  5. Hyperlipidemia.  6. Diabetes. Did not take his insulin yesterday so his sugars were  significantly elevated.    RECOMMENDATIONS AND DISCUSSION: The patient has multiple medical issues  including diabetes, coronary artery disease, history of congestive heart  failure with an admission in [facility] for heart failure, who, on  Sunday, experienced some chest pain and used nitroglycerin. Now, he is in  the setting of pseudonormalization of the T-waves in his lateral leads and  elevated troponin in the setting of a creatinine of 2.9.    I am curious as to whether he has had some progression of the RCA disease  with that 50% to 60% lesion in his right progressing, causing him to have  the abdominal discomfort, nausea, and also the rhythm disturbance that we  are seeing. Now the rhythm disturbance may also be related to electrolyte  shifts that are occurring because of his hyperglycemia.    His blood pressure is up, and I would treat this appropriately, if needed  with nitroglycerin, and ultimately once his glucose settles into range,  hopefully his creatinine will come down. I would, in the interim, hold his   lisinopril. Question is to continue Bumex for fear he might be a little  dehydrated with the hyperglycemia, and placing him on the Bumex, but he is  doing well right now, so I would start him on the Bumex tomorrow.    I would continue to follow his electrolytes closely, supplementing his  potassium and magnesium appropriately. Aggressive management of his  hyperglycemia, and an echocardiogram in the morning.    I would also get Nephrology involved, Dr. Sherrine Maples, if he needed emergently to  go for catheterization, there would be concern of contrast-induced  nephropathy. Dr. Park Breed will see in the morning.          E-Signed By  Rayburn Felt, M.D. 06/02/2009 14:50    Rayburn Felt, M.D.    cc: Rayburn Felt, M.D.  MAD/WMX; D: 05/30/2009 10:01 P; T: 05/31/2009 8:32 A; DOC# [id number]; Job#  [id number]

## 2009-05-31 NOTE — Consults (Addendum)
Name: Travis [name], Travis [name] Admitted: 05/31/2009  MR #: [id number] DOB: 27-Dec-1940  Account #: [id number] Age: 69  Consultant: [name], M.D. Location: [id number]     CONSULTATION REPORT    DATE OF CONSULTATION: 05/31/2009      ADMITTING ATTENDING: Loraine Leriche A. Doloresco, MD    REASON FOR ADMISSION: Chest pain, chronic kidney disease, acute renal  failure.    HISTORY OF PRESENT ILLNESS: This is a 69 year old [place] American man who  has been followed by [facility] Cunas who has not seen a nephrologist before. He  came to the hospital because of chest pain, orthopnea, PND, and shortness  of breath. He had some hiccups as well. He had nausea and diarrhea for 2  days. He denies any vomiting. He was found to have non-ST-elevation MI. We  were consulted because of the chronic kidney disease and acute kidney  injury. His creatinine in 2009 was around 1.7, creatinine in October 2010  was around 2.0, creatinine on arrival was 2.9, which has increased to 3.2.    There is no documented sign of any hypotension. There is no recent contrast  procedure. He denies taking any NSAIDs. He does not mention any  antibiotics.    Last echocardiogram in 2000 showed LVH. He had a normal LV function at that  point. Echocardiogram is pending.    He has diabetes for more than 30 years. He has diabetic retinopathy. He has  questionable neuropathy.    He has hypertension for more than 30 years. He denies any hepatitis B,  hepatitis C, HIV, lupus, malignancy. He has some problems passing urine.    He has some swelling in the legs in the past. He denies any recent  worsening swelling in the leg. Currently, he is denying any chest pain or  shortness of breath.    REVIEW OF SYSTEMS: Review of other systems obtained and negative.    PAST MEDICAL HISTORY  1. Chronic kidney disease based on creatinine of 1.8 to 2.0.  2. Coronary artery disease with history of MI.  3. Hypertension.  4. Diabetes for more than 30 years with diabetic retinopathy.   5. Dyslipidemia.  6. Hypothyroidism.  7. BPH.  8. History of MRSA.  9. Peripheral vascular disease status post bypass in the right lower  extremity.  10. Total knee replacement.  11. Cataract surgery.    SOCIAL HISTORY: Lives at home with the son. Widower. Wife was a dialysis  patient, Dr. Lynnea Ferrier. Her name was Rober Minion. She passed away.    HOME MEDICATIONS  1. Aspirin.  2. Diltiazem.  3. Fish oil.  4. Bumex.  5. Hydralazine.  6. Imdur.  7. Lantus.  8. Levothyroxine.  9. Lisinopril.  10. Metoprolol.  11. Rosuvastatin.  12. Zantac.  13. Novolin.    PHYSICAL EXAMINATION  GENERAL: Alert and oriented x3. An obese African American man in no acute  distress.  VITAL SIGNS: Temperature 97, pulse 70, respiratory rate 18, blood pressure  180/60.  HEENT: Extraocular muscles intact.  NECK: No JVD. No lymphadenopathy.  LUNGS: Clear to auscultation. Anteriorly few rare crackles in the left,  more than the right, base.  HEART: S1, S2 present. A 2/6 systolic ejection murmur.  ABDOMEN: Obese, soft, nontender, nondistended.  EXTREMITIES: Trace right lower extremity edema. Not significant lower  extremity edema.    LABORATORY: Hemoglobin 10.6, total WBC 9.9, platelets 131, sodium 134,  potassium 4.0, chloride 98, bicarbonate 29, BUN 75, creatinine 3.2, calcium  8.6. Magnesium 2.5.  CPK 341. Hemoglobin A1c 9.5. Troponin 6.8. Urinalysis  positive for protein, glucose, trace blood.    RADIOLOGICAL DATA: Chest x-ray done yesterday showing moderate pulmonary  vascular congestion. Head CT scan: No acute process.    IMPRESSION  1. Acute kidney injury, most likely secondary to hemodynamic changes in the  presence of myocardial infarction, and nausea and diarrhea.  2. Chronic kidney disease stage III based on creatinine 1.5 to 2.0.  3. Diabetic and hypertensive nephrosclerosis.  4. Non-ST-elevation myocardial infarction.  5. Diabetes.  6. Hypertension.  7. Diabetic retinopathy.  8. Dyslipidemia.   9. History of benign prostatic hypertrophy.  10. History of methicillin-resistant Staphylococcus aureus.  11. Peripheral vascular disease.    DISCUSSION: The patient is with acute kidney injury on top of the chronic  kidney disease. Creatinine at baseline is around 1.5 to 2.0. He has chronic  kidney disease, most likely secondary to diabetes and hypertension. His  peripheral vascular disease puts him at a risk for have vascular disease as  well. Uncontrolled diabetes with a hemoglobin A1c goal of less than 7 is  important along with blood pressure goal of less than 130/80. He should be  avoiding all NSAIDS, control of dyslipidemia is also very important.    He has acute kidney injury. There is no documentation of hypotension. He  had some nausea and diarrhea. He was on ACE inhibitor and diuretics. He  might have hemodynamic changes leading to ATN in the presence of  non-ST-elevation MI. At this point, I agree with holding the Bumex. He had  a normal echocardiogram before. He has some pleuritic crackles in the left  side of the lung. We should get echocardiogram if he has reduced EF and his  chest x-ray worsening which may have to give a little bit of diuretic  despite a worsening creatinine. Though he has diabetic retinopathy and  chronic kidney disease, we should hold his inhibitor because of his acute  kidney injury. He will benefit from angiotensin blocker down the road.    I discussed with him at length about his chronic kidney disease, acute  kidney injury. We discussed about contrast and nephropathy. He does not  want to consider dialysis if and when he needs it. We discussed about  complications of not pursuing dialysis including death. His wife was on  dialysis and he has that impression overall about patient's suffering on  dialysis. We have encouraged him to talk to his family members. Because of  his unwillingness to consider dialysis, we may have to seriously think   about contrast nephropathy. If he agrees to go on dialysis, we should go  ahead and do the cath if needed.    I am going to check the chronic kidney disease labs here. I am going to  follow _____ and protein. I am going to check iron stores, he may benefit  from Procrit.    PLAN  1. Check iron stores  2. May benefit from the Procrit.  3. Check vitamin D and PTH.  4. Do a renal panel.  5. He will discuss with his family regarding his dialysis if and when he  needs it.  6. I agree with holding the Bumex.  7. Await echocardiogram.  8. Follow up with [name] in outpatient chronic kidney disease program.    Thanks for giving [name] the opportunity to participate in this patient care.  We will follow.            Reviewed on  06/01/2009 10:37 AM      E-Signed By  [name], M.D. 06/14/2009 14:51    [name], M.D.    cc: [name], M.D.        ST/WMX; D: 05/31/2009 12:29 P; T: 05/31/2009 6:28 P; DOC# [id number]; Job#  [id number]

## 2009-06-01 LAB — CBC WITH AUTOMATED DIFF
ABS. BASOPHILS: 0 10*3/uL (ref 0.0–0.1)
ABS. EOSINOPHILS: 0.1 10*3/uL (ref 0.0–0.4)
ABS. LYMPHOCYTES: 1.2 10*3/uL (ref 0.8–3.5)
ABS. MONOCYTES: 0.7 10*3/uL (ref 0.0–1.0)
ABS. NEUTROPHILS: 6.6 10*3/uL (ref 1.8–8.0)
BASOPHILS: 0 % (ref 0–1)
EOSINOPHILS: 2 % (ref 0–7)
HCT: 31.6 % — ABNORMAL LOW (ref 36.6–50.3)
HGB: 10.6 g/dL — ABNORMAL LOW (ref 12.1–17.0)
LYMPHOCYTES: 14 % (ref 12–49)
MCH: 29 PG (ref 26.0–34.0)
MCHC: 33.5 g/dL (ref 30.0–36.5)
MCV: 86.6 FL (ref 80.0–99.0)
MONOCYTES: 8 % (ref 5–13)
NEUTROPHILS: 76 % — ABNORMAL HIGH (ref 32–75)
PLATELET: 143 10*3/uL — ABNORMAL LOW (ref 150–400)
RBC: 3.65 M/uL — ABNORMAL LOW (ref 4.10–5.70)
RDW: 16.3 % — ABNORMAL HIGH (ref 11.5–14.5)
WBC: 8.6 10*3/uL (ref 4.1–11.1)

## 2009-06-01 LAB — GLUCOSE, POC
Glucose (POC): 324 mg/dL — ABNORMAL HIGH (ref 75–110)
Glucose (POC): 394 mg/dL — ABNORMAL HIGH (ref 75–110)
Glucose (POC): 511 mg/dL — ABNORMAL HIGH (ref 75–110)
Glucose (POC): 534 mg/dL — ABNORMAL HIGH (ref 75–110)
Glucose (POC): 152 mg/dL — ABNORMAL HIGH (ref 75–110)
Glucose (POC): 297 mg/dL — ABNORMAL HIGH (ref 75–110)
Glucose (POC): 287 mg/dL — ABNORMAL HIGH (ref 75–110)

## 2009-06-01 LAB — RENAL FUNCTION PANEL
Albumin: 2.7 g/dL — ABNORMAL LOW (ref 3.5–5.0)
Anion gap: 8 mmol/L (ref 5–15)
BUN/Creatinine ratio: 27 — ABNORMAL HIGH (ref 12–20)
BUN: 73 MG/DL — ABNORMAL HIGH (ref 6–20)
CO2: 30 MMOL/L (ref 21–32)
Calcium: 8.4 MG/DL — ABNORMAL LOW (ref 8.5–10.1)
Chloride: 97 MMOL/L (ref 97–108)
Creatinine: 2.7 MG/DL — ABNORMAL HIGH (ref 0.6–1.3)
GFR est AA: 30 mL/min/{1.73_m2} — ABNORMAL LOW (ref >60)
GFR est non-AA: 25 mL/min/{1.73_m2} — ABNORMAL LOW (ref >60)
Glucose: 287 MG/DL — ABNORMAL HIGH (ref 65–100)
Phosphorus: 2 MG/DL — ABNORMAL LOW (ref 2.5–4.9)
Potassium: 3.9 MMOL/L (ref 3.5–5.1)
Sodium: 135 MMOL/L — ABNORMAL LOW (ref 136–145)

## 2009-06-01 LAB — CK W/ CKMB & INDEX
CK - MB: 7 NG/ML — ABNORMAL HIGH (ref 0.5–3.6)
CK-MB Index: 2.1 (ref 0–2.5)
CK: 338 U/L — ABNORMAL HIGH (ref 35–232)

## 2009-06-01 LAB — HEMOGLOBIN A1C WITH EAG: Hemoglobin A1c: 9.8 % — ABNORMAL HIGH (ref 4.8–6.0)

## 2009-06-01 LAB — TROPONIN I: Troponin-I, Qt.: 2.46 ng/mL — ABNORMAL HIGH (ref <0.05)

## 2009-06-01 LAB — PHOSPHORUS: Phosphorus: 2.1 MG/DL — ABNORMAL LOW (ref 2.5–4.9)

## 2009-06-01 LAB — MAGNESIUM: Magnesium: 2.3 MG/DL (ref 1.6–2.4)

## 2009-06-02 LAB — PHOSPHORUS: Phosphorus: 3.2 MG/DL (ref 2.5–4.9)

## 2009-06-02 LAB — RENAL FUNCTION PANEL
Albumin: 2.6 g/dL — ABNORMAL LOW (ref 3.5–5.0)
Anion gap: 7 mmol/L (ref 5–15)
BUN/Creatinine ratio: 32 — ABNORMAL HIGH (ref 12–20)
BUN: 54 MG/DL — ABNORMAL HIGH (ref 6–20)
CO2: 33 MMOL/L — ABNORMAL HIGH (ref 21–32)
Calcium: 8.5 MG/DL (ref 8.5–10.1)
Chloride: 100 MMOL/L (ref 97–108)
Creatinine: 1.7 MG/DL — ABNORMAL HIGH (ref 0.6–1.3)
GFR est AA: 52 mL/min/{1.73_m2} — ABNORMAL LOW (ref >60)
GFR est non-AA: 43 mL/min/{1.73_m2} — ABNORMAL LOW (ref >60)
Glucose: 112 MG/DL — ABNORMAL HIGH (ref 65–100)
Phosphorus: 3.4 MG/DL (ref 2.5–4.9)
Potassium: 3.4 MMOL/L — ABNORMAL LOW (ref 3.5–5.1)
Sodium: 140 MMOL/L (ref 136–145)

## 2009-06-02 LAB — CBC WITH AUTOMATED DIFF
ABS. BASOPHILS: 0 10*3/uL (ref 0.0–0.1)
ABS. EOSINOPHILS: 0.2 10*3/uL (ref 0.0–0.4)
ABS. LYMPHOCYTES: 1.9 10*3/uL (ref 0.8–3.5)
ABS. MONOCYTES: 0.8 10*3/uL (ref 0.0–1.0)
ABS. NEUTROPHILS: 3.8 10*3/uL (ref 1.8–8.0)
BASOPHILS: 0 % (ref 0–1)
EOSINOPHILS: 3 % (ref 0–7)
HCT: 33.1 % — ABNORMAL LOW (ref 36.6–50.3)
HGB: 11.1 g/dL — ABNORMAL LOW (ref 12.1–17.0)
LYMPHOCYTES: 28 % (ref 12–49)
MCH: 29.1 PG (ref 26.0–34.0)
MCHC: 33.5 g/dL (ref 30.0–36.5)
MCV: 86.9 FL (ref 80.0–99.0)
MONOCYTES: 11 % (ref 5–13)
NEUTROPHILS: 58 % (ref 32–75)
PLATELET: 149 10*3/uL — ABNORMAL LOW (ref 150–400)
RBC: 3.81 M/uL — ABNORMAL LOW (ref 4.10–5.70)
RDW: 16 % — ABNORMAL HIGH (ref 11.5–14.5)
WBC: 6.6 10*3/uL (ref 4.1–11.1)

## 2009-06-02 LAB — CREATININE, UR, RANDOM: Creatinine, urine random: 33.7 mg/dL

## 2009-06-02 LAB — GLUCOSE, POC
Glucose (POC): 359 mg/dL — ABNORMAL HIGH (ref 75–110)
Glucose (POC): 271 mg/dL — ABNORMAL HIGH (ref 75–110)
Glucose (POC): 350 mg/dL — ABNORMAL HIGH (ref 75–110)
Glucose (POC): 412 mg/dL — ABNORMAL HIGH (ref 75–110)
Glucose (POC): 416 mg/dL — ABNORMAL HIGH (ref 75–110)
Glucose (POC): 340 mg/dL — ABNORMAL HIGH (ref 75–110)

## 2009-06-02 LAB — PTT: aPTT: 41.8 s — ABNORMAL HIGH (ref 24.0–33.0)

## 2009-06-02 LAB — PROTHROMBIN TIME + INR
INR: 1.2 — ABNORMAL HIGH (ref 0.9–1.1)
Prothrombin time: 12.1 SECS — ABNORMAL HIGH (ref 9.0–11.0)

## 2009-06-02 LAB — PROTEIN URINE, RANDOM: Protein, urine random: 76 MG/DL — ABNORMAL HIGH (ref 0.0–11.9)

## 2009-06-02 LAB — MAGNESIUM: Magnesium: 2.2 MG/DL (ref 1.6–2.4)

## 2009-06-02 NOTE — Procedures (Addendum)
 [facility]   [address]   [place], [place] [address]      Name: Travis [name], Travis [name] Service Date: 06/02/2009  DOB: 10/29/1940 Ordered by: Tharon Aquas, MD  MR # [id number] Location: [facility]  Sex: M Age: 69  Billing #: [id number] Date of Adm: 05/31/2009       ADENOSINE MYOCARDIAL PERFUSION     CLINICAL DIAGNOSIS/INDICATIONS: Chest discomfort, Dyspnea, post MI.    MEDICATIONS: Plavix, Amlodipine, ASA, Hydralazine, Imdur, Lisinopril,  Metoprolol, Bumex. Fish oil, Synthroid, Lantus, Zantac. Isordil.    BASELINE TRACING:    ADENOSINE DOSE:    RESTING HEART RATE: 90 bpm  RESTING BLOOD PRESSURE: 200/88 mmHg  RESTING EKG: NSR, LAE, 1 ST degree AV block      MINUTE HEART RATE BP COMMENT  0 90 200/88  1 53  2 93 1:30 adenosine   stopped  3 95 177/83 2:58 stress stopped   secondary to heart   block  4 93 220/92  5 92  6 90  7 93  8 92    POST INFUSION 91 220/92    66 %Percentage of predicted maximum heart rate (151 bpm)  1.00 Maximum METS  20.900 Double Product (100 bpm x 220/92 mmHg, max systolic BP.  7:28 Total Stress Time    INTERPRETATION:  SUMMARY:   1. Resting electrocardiogram, NSR, LAE 1st degree AV block.   2. BP response to exercise, resting hypertension.   3. Arrhythmias: 2:1 heart block at the start of Adenosine infusion.   4. No ST changes.    CONCLUSION: Adenosine infusion stopped due to 2nd degree heart  block(2:1).      E-Signed By  Tharon Aquas, MD 07/05/2009 19:14  Tharon Aquas, MD    cc: Tharon Aquas, MD   Kerin Salen, MD        SAK/CB; D: 06/02/2009 12:35 P; T: 06/02/2009 4:12 P; DOC# [id number]; JOB#  [id number]

## 2009-06-03 LAB — CBC WITH AUTOMATED DIFF
ABS. BASOPHILS: 0 10*3/uL (ref 0.0–0.1)
ABS. EOSINOPHILS: 0.3 10*3/uL (ref 0.0–0.4)
ABS. LYMPHOCYTES: 1.9 10*3/uL (ref 0.8–3.5)
ABS. MONOCYTES: 0.7 10*3/uL (ref 0.0–1.0)
ABS. NEUTROPHILS: 4.7 10*3/uL (ref 1.8–8.0)
BASOPHILS: 0 % (ref 0–1)
EOSINOPHILS: 4 % (ref 0–7)
HCT: 34.1 % — ABNORMAL LOW (ref 36.6–50.3)
HGB: 11.6 g/dL — ABNORMAL LOW (ref 12.1–17.0)
LYMPHOCYTES: 25 % (ref 12–49)
MCH: 29.5 PG (ref 26.0–34.0)
MCHC: 34 g/dL (ref 30.0–36.5)
MCV: 86.8 FL (ref 80.0–99.0)
MONOCYTES: 9 % (ref 5–13)
NEUTROPHILS: 62 % (ref 32–75)
PLATELET: 167 10*3/uL (ref 150–400)
RBC: 3.93 M/uL — ABNORMAL LOW (ref 4.10–5.70)
RDW: 16.1 % — ABNORMAL HIGH (ref 11.5–14.5)
WBC: 7.5 10*3/uL (ref 4.1–11.1)

## 2009-06-03 LAB — GLUCOSE, POC
Glucose (POC): 340 mg/dL — ABNORMAL HIGH (ref 75–110)
Glucose (POC): 284 mg/dL — ABNORMAL HIGH (ref 75–110)
Glucose (POC): 383 mg/dL — ABNORMAL HIGH (ref 75–110)
Glucose (POC): 385 mg/dL — ABNORMAL HIGH (ref 75–110)
Glucose (POC): 410 mg/dL — ABNORMAL HIGH (ref 75–110)
Glucose (POC): 274 mg/dL — ABNORMAL HIGH (ref 75–110)

## 2009-06-03 LAB — RENAL FUNCTION PANEL
Albumin: 3 g/dL — ABNORMAL LOW (ref 3.5–5.0)
Anion gap: 10 mmol/L (ref 5–15)
BUN/Creatinine ratio: 26 — ABNORMAL HIGH (ref 12–20)
BUN: 60 MG/DL — ABNORMAL HIGH (ref 6–20)
CO2: 30 MMOL/L (ref 21–32)
Calcium: 8.6 MG/DL (ref 8.5–10.1)
Chloride: 99 MMOL/L (ref 97–108)
Creatinine: 2.3 MG/DL — ABNORMAL HIGH (ref 0.6–1.3)
GFR est AA: 36 mL/min/{1.73_m2} — ABNORMAL LOW (ref >60)
GFR est non-AA: 30 mL/min/{1.73_m2} — ABNORMAL LOW (ref >60)
Glucose: 121 MG/DL — ABNORMAL HIGH (ref 65–100)
Phosphorus: 3.7 MG/DL (ref 2.5–4.9)
Potassium: 3.4 MMOL/L — ABNORMAL LOW (ref 3.5–5.1)
Sodium: 139 MMOL/L (ref 136–145)

## 2009-06-03 LAB — MAGNESIUM: Magnesium: 2.1 MG/DL (ref 1.6–2.4)

## 2009-06-03 LAB — PTT: aPTT: 41.5 s — ABNORMAL HIGH (ref 24.0–33.0)

## 2009-06-03 LAB — PROTHROMBIN TIME + INR
INR: 1.1 (ref 0.9–1.1)
Prothrombin time: 11.5 SECS — ABNORMAL HIGH (ref 9.0–11.0)

## 2009-06-04 LAB — CBC WITH AUTOMATED DIFF
ABS. BASOPHILS: 0 10*3/uL (ref 0.0–0.1)
ABS. EOSINOPHILS: 0.3 10*3/uL (ref 0.0–0.4)
ABS. LYMPHOCYTES: 5.2 10*3/uL — ABNORMAL HIGH (ref 0.8–3.5)
ABS. MONOCYTES: 1.6 10*3/uL — ABNORMAL HIGH (ref 0.0–1.0)
ABS. NEUTROPHILS: 7.3 10*3/uL (ref 1.8–8.0)
BASOPHILS: 0 % (ref 0–1)
EOSINOPHILS: 2 % (ref 0–7)
HCT: 30.3 % — ABNORMAL LOW (ref 36.6–50.3)
HGB: 10.2 g/dL — ABNORMAL LOW (ref 12.1–17.0)
LYMPHOCYTES: 36 % (ref 12–49)
MCH: 29.1 PG (ref 26.0–34.0)
MCHC: 33.7 g/dL (ref 30.0–36.5)
MCV: 86.3 FL (ref 80.0–99.0)
MONOCYTES: 11 % (ref 5–13)
NEUTROPHILS: 51 % (ref 32–75)
PLATELET: 177 10*3/uL (ref 150–400)
RBC: 3.51 M/uL — ABNORMAL LOW (ref 4.10–5.70)
RDW: 16.4 % — ABNORMAL HIGH (ref 11.5–14.5)
WBC COMMENTS: REACTIVE
WBC: 14.4 10*3/uL — ABNORMAL HIGH (ref 4.1–11.1)

## 2009-06-04 LAB — PROTHROMBIN TIME + INR
INR: 1.3 — ABNORMAL HIGH (ref 0.9–1.1)
Prothrombin time: 12.8 SECS — ABNORMAL HIGH (ref 9.0–11.0)

## 2009-06-04 LAB — GLUCOSE, POC
Glucose (POC): 115 mg/dL — ABNORMAL HIGH (ref 75–110)
Glucose (POC): 80 mg/dL (ref 75–110)
Glucose (POC): 92 mg/dL (ref 75–110)
Glucose (POC): 246 mg/dL — ABNORMAL HIGH (ref 75–110)
Glucose (POC): 296 mg/dL — ABNORMAL HIGH (ref 75–110)
Glucose (POC): 299 mg/dL — ABNORMAL HIGH (ref 75–110)
Glucose (POC): 231 mg/dL — ABNORMAL HIGH (ref 75–110)

## 2009-06-04 LAB — MAGNESIUM: Magnesium: 2 MG/DL (ref 1.6–2.4)

## 2009-06-04 LAB — CULTURE, BLOOD, PAIRED: Culture result:: NO GROWTH

## 2009-06-04 LAB — RENAL FUNCTION PANEL
Albumin: 2.8 g/dL — ABNORMAL LOW (ref 3.5–5.0)
Anion gap: 8 mmol/L (ref 5–15)
BUN/Creatinine ratio: 26 — ABNORMAL HIGH (ref 12–20)
BUN: 55 MG/DL — ABNORMAL HIGH (ref 6–20)
CO2: 29 MMOL/L (ref 21–32)
Calcium: 8.6 MG/DL (ref 8.5–10.1)
Chloride: 101 MMOL/L (ref 97–108)
Creatinine: 2.1 MG/DL — ABNORMAL HIGH (ref 0.6–1.3)
GFR est AA: 40 mL/min/{1.73_m2} — ABNORMAL LOW (ref >60)
GFR est non-AA: 33 mL/min/{1.73_m2} — ABNORMAL LOW (ref >60)
Glucose: 36 MG/DL — CL (ref 65–100)
Phosphorus: 3.8 MG/DL (ref 2.5–4.9)
Potassium: 3.3 MMOL/L — ABNORMAL LOW (ref 3.5–5.1)
Sodium: 138 MMOL/L (ref 136–145)

## 2009-06-04 LAB — PTT: aPTT: 48.1 s — ABNORMAL HIGH (ref 24.0–33.0)

## 2009-06-04 NOTE — Discharge Summary (Addendum)
Name: Travis [name] Admitted: 05/31/2009  MR #: [id number] Discharged:  Account #: [id number] DOB: Dec 23, 1940  Physician: Kerin Salen, MD Age 69     DISCHARGE SUMMARY      INTERVAL DISCHARGE SUMMARY    PRIMARY CARE PHYSICIAN: [facility].    CARDIOLOGIST: Dr. Laddie Aquas.    CONSULTATION: Pulmonary: Dr. Aloha Gell. Nephrology: Dr.  Jerre Simon D. Tripathi.    CHIEF COMPLAINT: Shortness of breath.    HISTORY OF PRESENT ILLNESS: Please see dictated H and P for comprehensive  review. Briefly, this is a patient with known coronary artery disease,  congestive heart failure, as well as chronic kidney disease which is  severe, and type 2 diabetes who presents with decompensated diastolic  congestive heart failure, non-ST-elevation MI, and worsening chronic kidney  disease in the setting of nonketotic hyperosmolar diabetic state. All of  this caused an altered mental status.    HOSPITAL COURSE BY ISSUE  1. Uncontrolled diabetes. The patient was initially placed on an insulin  drip. Fluids were given judiciously given the patient was in volume  overload. The patient's blood sugar was controlled reasonably well and he  was transitioned to a sliding scale. He was not restarted on his outpatient  Lantus given the fact that he was in renal failure and his GFR was low. As  his kidney function improved, his sugars began to increase and he was  restarted on his daily Lantus. His sugars have been moderately well  controlled with blood sugars in the range of 200 to 250 on average with  some blood sugars in the 100 to 120 range and some as high as 300 to 350.  At home the patient's blood sugars were labile and 200 is typical number  for him.  2. Dyspnea. This was treated with oxygenation as well as diuresis. The  patient defervesced and did well. His Bumex was held today given the slight  increase in his creatinine.  3. Acute renal failure. The patient was admitted with a creatinine of 2.8,   which went as high as 3.3 early in his admission. He defervesced down to a  creatinine of 1.7 yesterday. This morning, creatinine is elevated slightly  at 2.3. He is being followed by Neurology. I believe this increase in  creatinine today was likely from the fact that he has been diuresed.  Consequently, Cardiology when they saw the patient today, held his Bumex  and we will follow his kidney function going forward.  4. Non-ST-elevation MI. The patient had a peak troponin on this admission  of 6.82. This diminished to 2.46. He has not had any chest pain as part of  this process. He is being watched on telemetry and anticipate either a  stress test or catheterization depending on kidney function on Monday,  January 10th.  5. Hypertension. This is labile. This is likely related to both cardiac and  renal disease. He is being treated with multiple medications including ISMN  60 mg daily, lisinopril 10 mg, hydralazine 50 mg 3 times daily, clonidine  0.1 mg every 8 hours as needed. Additionally, the Terazosin he takes for  his BPH contributes to blood pressure control. Metoprolol 100 mg twice  daily.  6. BPH. Continue his terazosin, no issues.  7. Hypokalemia. This has mildly been repleted and following.  8. Possible pneumonia. The patient was admitted with possible pneumonia. He  was started on ceftriaxone and continued on Advair and Spiriva. He has done  well  without wheezing.  9. Hyperlipidemia. The patient has been continued on Crestor.  10. Final disposition and evaluation as well as discharge medications and  discharge plan will be dictated at the time of discharge by one of my  partners.        Reviewed on 06/05/2009 1:18 PM        E-Signed By  Kerin Salen, MD 06/27/2009 08:09    Kerin Salen, MD    cc: Kerin Salen, MD        JM/WMX; D: 06/03/2009 5:19 P; T: 06/04/2009 1:22 P; DOC# [id number]; Job#  [id number]

## 2009-06-05 LAB — RENAL FUNCTION PANEL
Albumin: 2.5 g/dL — ABNORMAL LOW (ref 3.5–5.0)
Anion gap: 4 mmol/L — ABNORMAL LOW (ref 5–15)
BUN/Creatinine ratio: 27 — ABNORMAL HIGH (ref 12–20)
BUN: 41 MG/DL — ABNORMAL HIGH (ref 6–20)
CO2: 29 MMOL/L (ref 21–32)
Calcium: 8.4 MG/DL — ABNORMAL LOW (ref 8.5–10.1)
Chloride: 105 MMOL/L (ref 97–108)
Creatinine: 1.5 MG/DL — ABNORMAL HIGH (ref 0.6–1.3)
GFR est AA: 60 mL/min/{1.73_m2} — ABNORMAL LOW (ref >60)
GFR est non-AA: 49 mL/min/{1.73_m2} — ABNORMAL LOW (ref >60)
Glucose: 109 MG/DL — ABNORMAL HIGH (ref 65–100)
Phosphorus: 3.5 MG/DL (ref 2.5–4.9)
Potassium: 3.7 MMOL/L (ref 3.5–5.1)
Sodium: 138 MMOL/L (ref 136–145)

## 2009-06-05 LAB — GLUCOSE, POC
Glucose (POC): 288 mg/dL — ABNORMAL HIGH (ref 75–110)
Glucose (POC): 73 mg/dL — ABNORMAL LOW (ref 75–110)
Glucose (POC): 63 mg/dL — ABNORMAL LOW (ref 75–110)
Glucose (POC): 83 mg/dL (ref 75–110)
Glucose (POC): 56 mg/dL — ABNORMAL LOW (ref 75–110)
Glucose (POC): 58 mg/dL — ABNORMAL LOW (ref 75–110)
Glucose (POC): 112 mg/dL — ABNORMAL HIGH (ref 75–110)

## 2009-06-05 LAB — CBC WITH AUTOMATED DIFF
ABS. BASOPHILS: 0 10*3/uL (ref 0.0–0.1)
ABS. EOSINOPHILS: 0.3 10*3/uL (ref 0.0–0.4)
ABS. LYMPHOCYTES: 2 10*3/uL (ref 0.8–3.5)
ABS. MONOCYTES: 0.9 10*3/uL (ref 0.0–1.0)
ABS. NEUTROPHILS: 7 10*3/uL (ref 1.8–8.0)
BASOPHILS: 0 % (ref 0–1)
EOSINOPHILS: 3 % (ref 0–7)
HCT: 28 % — ABNORMAL LOW (ref 36.6–50.3)
HGB: 9.3 g/dL — ABNORMAL LOW (ref 12.1–17.0)
LYMPHOCYTES: 20 % (ref 12–49)
MCH: 28.9 PG (ref 26.0–34.0)
MCHC: 33.2 g/dL (ref 30.0–36.5)
MCV: 87 FL (ref 80.0–99.0)
MONOCYTES: 9 % (ref 5–13)
NEUTROPHILS: 68 % (ref 32–75)
PLATELET: 169 10*3/uL (ref 150–400)
RBC: 3.22 M/uL — ABNORMAL LOW (ref 4.10–5.70)
RDW: 16.7 % — ABNORMAL HIGH (ref 11.5–14.5)
WBC: 10.2 10*3/uL (ref 4.1–11.1)

## 2009-06-05 LAB — URINALYSIS W/MICROSCOPIC
Bacteria: NEGATIVE /HPF
Bilirubin: NEGATIVE
Glucose: NEGATIVE MG/DL
Ketone: NEGATIVE MG/DL
Leukocyte Esterase: NEGATIVE
Nitrites: NEGATIVE
Protein: 100 MG/DL — AB
RBC: >100 /HPF — ABNORMAL HIGH (ref 0–5)
Specific gravity: 1.014 (ref 1.003–1.030)
Urobilinogen: 0.2 EU/DL (ref 0.2–1.0)
pH (UA): 7 (ref 5.0–8.0)

## 2009-06-05 LAB — PROTHROMBIN TIME + INR
INR: 1.2 — ABNORMAL HIGH (ref 0.9–1.1)
Prothrombin time: 12.2 SECS — ABNORMAL HIGH (ref 9.0–11.0)

## 2009-06-05 LAB — MAGNESIUM: Magnesium: 2.1 MG/DL (ref 1.6–2.4)

## 2009-06-05 LAB — PTT: aPTT: 51.3 s — ABNORMAL HIGH (ref 24.0–33.0)

## 2009-06-06 LAB — CBC WITH AUTOMATED DIFF
ABS. BASOPHILS: 0 10*3/uL (ref 0.0–0.1)
ABS. EOSINOPHILS: 0.2 10*3/uL (ref 0.0–0.4)
ABS. LYMPHOCYTES: 1.6 10*3/uL (ref 0.8–3.5)
ABS. MONOCYTES: 0.8 10*3/uL (ref 0.0–1.0)
ABS. NEUTROPHILS: 5.4 10*3/uL (ref 1.8–8.0)
BASOPHILS: 0 % (ref 0–1)
EOSINOPHILS: 2 % (ref 0–7)
HCT: 25.7 % — ABNORMAL LOW (ref 36.6–50.3)
HGB: 8.5 g/dL — ABNORMAL LOW (ref 12.1–17.0)
LYMPHOCYTES: 20 % (ref 12–49)
MCH: 28.8 PG (ref 26.0–34.0)
MCHC: 33.1 g/dL (ref 30.0–36.5)
MCV: 87.1 FL (ref 80.0–99.0)
MONOCYTES: 10 % (ref 5–13)
NEUTROPHILS: 68 % (ref 32–75)
PLATELET: 166 10*3/uL (ref 150–400)
RBC: 2.95 M/uL — ABNORMAL LOW (ref 4.10–5.70)
RDW: 16.7 % — ABNORMAL HIGH (ref 11.5–14.5)
WBC: 7.9 10*3/uL (ref 4.1–11.1)

## 2009-06-06 LAB — METABOLIC PANEL, BASIC
Anion gap: 7 mmol/L (ref 5–15)
BUN/Creatinine ratio: 20 (ref 12–20)
BUN: 32 MG/DL — ABNORMAL HIGH (ref 6–20)
CO2: 28 MMOL/L (ref 21–32)
Calcium: 8 MG/DL — ABNORMAL LOW (ref 8.5–10.1)
Chloride: 99 MMOL/L (ref 97–108)
Creatinine: 1.6 MG/DL — ABNORMAL HIGH (ref 0.6–1.3)
GFR est AA: 55 mL/min/{1.73_m2} — ABNORMAL LOW (ref >60)
GFR est non-AA: 46 mL/min/{1.73_m2} — ABNORMAL LOW (ref >60)
Glucose: 354 MG/DL — ABNORMAL HIGH (ref 65–100)
Potassium: 4 MMOL/L (ref 3.5–5.1)
Sodium: 134 MMOL/L — ABNORMAL LOW (ref 136–145)

## 2009-06-06 LAB — URINALYSIS W/ RFLX MICROSCOPIC
Bilirubin: NEGATIVE
Blood: NEGATIVE
Glucose: 100 MG/DL — AB
Ketone: NEGATIVE MG/DL
Leukocyte Esterase: NEGATIVE
Nitrites: NEGATIVE
Protein: 100 MG/DL — AB
Specific gravity: 1.02 (ref 1.003–1.030)
Urobilinogen: 0.2 EU/DL (ref 0.2–1.0)
pH (UA): 5.5 (ref 5.0–8.0)

## 2009-06-06 LAB — GLUCOSE, POC
Glucose (POC): 270 mg/dL — ABNORMAL HIGH (ref 75–110)
Glucose (POC): 338 mg/dL — ABNORMAL HIGH (ref 75–110)
Glucose (POC): 330 mg/dL — ABNORMAL HIGH (ref 75–110)
Glucose (POC): 317 mg/dL — ABNORMAL HIGH (ref 75–110)

## 2009-06-06 LAB — PHOSPHORUS: Phosphorus: 3.1 MG/DL (ref 2.5–4.9)

## 2009-06-06 LAB — HGB & HCT
HCT: 26.1 % — ABNORMAL LOW (ref 36.6–50.3)
HGB: 8.7 g/dL — ABNORMAL LOW (ref 12.1–17.0)

## 2009-06-06 LAB — CULTURE, URINE
Colony Count: <1000
Culture result:: NO GROWTH

## 2009-06-06 LAB — PTT: aPTT: 41.5 s — ABNORMAL HIGH (ref 24.0–33.0)

## 2009-06-06 LAB — MAGNESIUM: Magnesium: 2 MG/DL (ref 1.6–2.4)

## 2009-06-06 LAB — PROTHROMBIN TIME + INR
INR: 1.2 — ABNORMAL HIGH (ref 0.9–1.1)
Prothrombin time: 12.5 SECS — ABNORMAL HIGH (ref 9.0–11.0)

## 2009-06-06 NOTE — Consults (Addendum)
Name: Travis [name], Travis [name] Admitted: 05/31/2009  MR #: [id number] DOB: 1941/02/01  Account #: [id number] Age: 68  Consultant: Ian Malkin, M.D. Location: [id number]     CONSULTATION REPORT    DATE OF CONSULTATION: 06/06/2009      REASON FOR CONSULTATION: I am asked by Dr. Tharon Aquas to see this  patient for anemia.    HISTORY OF PRESENT ILLNESS: The patient is a 69 year old currently  hospitalized with myocardial infarction, who is now status post stent  placement; he has been placed on aspirin plus Plavix.    He has been noted to have a falling hemoglobin which is progressive; he  denies any abdominal pain, melena, hematochezia; he has not had any nausea  or vomiting.    Generally, he feels much improved from presentation with myocardial  infarction.    With the falling hemoglobin, consultation is requested in regards to  anemia.    The patient is incredulous as to the possibility of his having any  intestinal problem because of the absence of symptoms at this point in  time.    PAST MEDICAL HISTORY: Otherwise remarkable for hypertension, diabetes,  renal insufficiency, personal history of peripheral artery disease.    ALLERGIES: HE HAS NO KNOWN MEDICATION ALLERGIES.    OTHER MEDICATIONS: Currently on order include that of:  1. Bumetanide.  2. Ceftriaxone.  3. Iron.  4. Epogen.  5. Aspirin 325 mg daily.  6. Hydralazine.  7. Terazosin.  8. Lisinopril.  9. Insulin.  10. Clonidine.  11. Amlodipine.  12. Metoprolol.  13. Albuterol.  14. Levothyroxine.  15. Fluticasone.    FAMILY AND SOCIAL HISTORY: Remarkable for a previous history of smoking.    REVIEW OF SYSTEMS: Negative for any fevers, chills, jaundice; currently he  is without any chest pain, currently is without any shortness of breath,  acid indigestion, heartburn, focal weakness, focal numbness, chronic cough,  chronic wheezing. He does not know of any previous history of anemia.    PHYSICAL EXAMINATION  GENERAL: The patient is alert and comfortable.   VITAL SIGNS: Blood pressure is 190/70, pulse is 78 and regular.  Respiratory is 14. Temperature by nursing staff is 98.5.  HEENT: No lymphadenopathy, scleral icterus.  LUNGS: Clear to percussion and auscultation.  CARDIAC: Shows a regular rate and rhythm with a 2/6 systolic murmur  without definite diastolic murmur, rub or gallop.  ABDOMEN: Normal bowel sounds. There is no tenderness, no masses. I cannot  feel neither his liver nor his spleen.  EXTREMITIES: No edema.  SKIN: No rash.  JOINTS: No arthritis.    LABORATORIES: White count 7.9, hemoglobin is 8.7, hematocrit 26.1,  platelet count is a 166. PT is 12.5, PTT is 41.5. Potassium is 4.0, glucose  354.    Iron 25, iron binding capacity 195, ferritin 160. Hemoglobin A1c of 9.8.  Vitamin B12 level recently was seen at 1047.    IMPRESSION: The patient is a 69 year old with anemia; certainly he has a  number of risk factors for anemia secondary to chronic disease.    Because he is on aspirin plus Plavix, he does have the possibility of  intestinal blood loss but this would be occult at this point in time.    I would place him on daily MiraLax, to obtain stool samples for occult  blood testing; if negative for blood, I doubt that he would be amenable for  intestinal evaluation because of his being incredulous as to the  possibility of intestinal disease in  the absence of intestinal symptoms.    I would place him on misoprostol because he is on aspirin plus Plavix which  puts him at significant risk of intestinal bleeding.    If he has occult intestinal blood loss, I would advise him as to that  finding and we approach him as to the possibility of other intestinal  evaluation.    Thank you very much for having asked me to see him.          E-Signed By  Ian Malkin, M.D. 06/09/2009 19:06    Ian Malkin, M.D.    cc: Ian Malkin, M.D.        AL/WMX; D: 06/06/2009 6:53 P; T: 06/06/2009 7:36 P; DOC# [id number]; Job#  [id number]

## 2009-06-06 NOTE — Consults (Addendum)
Name: Travis [name], Travis [name] Admitted: 05/31/2009  MR #: [id number] DOB: April 08, 1941  Account #: [id number] Age: 69  Consultant: Rosalyn Gess. Chalmers Guest, M.D. Location: [id number]     CONSULTATION REPORT    DATE OF CONSULTATION: 06/06/2009      CHIEF COMPLAINT: Hematuria.    HISTORY: The patient is a 69 year old who was admitted for shortness of  breath and cardiac symptoms who underwent a RCA drug-eluting stent and was  placed on Plavix. His PTT was elevated at 51. He was found to have  hematuria after Foley catheter placement in the setting of his BPH on  terazosin, which is his usual alpha blocker. He is not followed by a  urologist and this is a new patient to our group referred by Dr. Laddie Aquas. He had an ultrasound done today which was reviewed showing no  evidence of upper tract obstruction, hematuria, stones, or masses. The  bladder was inspected with some posterior wall thickening which is  nonspecific which may require followup. Prior to this hospitalization he  was stable from a BPH standpoint with no gross hematuria. He does have a  remote smoking history, quit 30 years ago, and is a [place] veteran with  some exposure history.    PAST MEDICAL HISTORY: Significant for hypertension, diabetes, CKD with  baseline creatinine 1.7, CAD, peripheral artery disease, CHF, COPD,  hypothyroidism, and anemia.    PAST SURGICAL HISTORY: Includes what sounds like a fem bypass and a right  total knee replacement.    FAMILY HISTORY: Smoking and alcohol abuse noted.    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    ALLERGIES: NONE.    REVIEW OF SYSTEMS: A 12 point review of systems was done and other than  noted above is unremarkable.    PHYSICAL EXAMINATION  VITAL SIGNS: The patient is 37, pulse 70, respirations 14.  GENERAL: This is a well-developed male in no acute distress at this time.  His urine was inspected at the bedside with no clots noted. A tinge of   hematuria is seen. Apparently this is improved from a few days ago where  visible blood was shown.  ABDOMEN: Soft and nontender with no scars, hernias, or CVA tenderness.  There is no palpable suprapubic discomfort to suggest retention. The  phallus is circumcised with normal urethra. The testes, epididymis, and  scrotum are normal. The perineum is without lesions or cellulitis. The  prostate is enlarged at 50 mL without nodularity. Seminal vesicle  abnormality and normal tone is appreciated.  EXTREMITIES: Show some right knee replacement with some chronic skin  changes and no edema.    LABORATORY DATA: Show a urinalysis with large blood and greater than 100  RBCs in the absence of white cells, bacteria, or leukocyte esterase. His  creatinine is now 1.5, down from the 2.1 range, and his hemoglobin today is  7.9.    Ultrasound was reviewed as above, but no clots are seen in the bladder.    IMPRESSION  1. Benign prostatic hypertrophy. On Terazosin.  2. Chronic renal insufficiency due to multiple underlying comorbidities  including diabetes and hypertension. Nephrology workup in progress but  creatinine appears to be improving with prerenal measures, and he is  voiding well.  3. Hematuria which could be due to Foley trauma but is improving slowly. I  do think that with his smoking and exposure history that this requires  outpatient cystoscopy which we will coordinate at our [facility]  office now that  he is doing better. He can see myself or Dr. Cheral Almas in the  next 2 to 4 weeks for cytology and possible flexible cystoscopy. If he is  tolerant to that I do think that is indicated based on the findings on the  ultrasound which show a thickening in the bladder which could represent  urothelial malignancy. The patient was aware of this and will follow up  with [name] in the next several weeks.  4. Thank you for including [name] in his care. There is no evidence of   infection, and we did also talk about starting him on generic Flomax rather  than the terazosin due to the reduced side effect profile of that.      E-Signed By  Rosalyn Gess Chalmers Guest, M.D. 07/06/2009 09:46    Rosalyn Gess. Chalmers Guest, M.D.    cc: Rosalyn Gess. Chalmers Guest, M.D.        MEF/WMX; D: 06/06/2009 1:56 P; T: 06/06/2009 2:27 P; DOC# [id number]; Job#  [id number]

## 2009-06-07 LAB — RENAL FUNCTION PANEL
Albumin: 2.6 g/dL — ABNORMAL LOW (ref 3.5–5.0)
Anion gap: 7 mmol/L (ref 5–15)
BUN/Creatinine ratio: 21 — ABNORMAL HIGH (ref 12–20)
BUN: 35 MG/DL — ABNORMAL HIGH (ref 6–20)
CO2: 30 MMOL/L (ref 21–32)
Calcium: 8.5 MG/DL (ref 8.5–10.1)
Chloride: 102 MMOL/L (ref 97–108)
Creatinine: 1.7 MG/DL — ABNORMAL HIGH (ref 0.6–1.3)
GFR est AA: 52 mL/min/{1.73_m2} — ABNORMAL LOW (ref >60)
GFR est non-AA: 43 mL/min/{1.73_m2} — ABNORMAL LOW (ref >60)
Glucose: 62 MG/DL — ABNORMAL LOW (ref 65–100)
Phosphorus: 3.6 MG/DL (ref 2.5–4.9)
Potassium: 3.7 MMOL/L (ref 3.5–5.1)
Sodium: 139 MMOL/L (ref 136–145)

## 2009-06-07 LAB — GLUCOSE, POC
Glucose (POC): 117 mg/dL — ABNORMAL HIGH (ref 75–110)
Glucose (POC): 125 mg/dL — ABNORMAL HIGH (ref 75–110)
Glucose (POC): 131 mg/dL — ABNORMAL HIGH (ref 75–110)
Glucose (POC): 198 mg/dL — ABNORMAL HIGH (ref 75–110)
Glucose (POC): 328 mg/dL — ABNORMAL HIGH (ref 75–110)
Glucose (POC): 56 mg/dL — ABNORMAL LOW (ref 75–110)
Glucose (POC): 57 mg/dL — ABNORMAL LOW (ref 75–110)
Glucose (POC): 131 mg/dL — ABNORMAL HIGH (ref 75–110)

## 2009-06-07 LAB — CBC WITH AUTOMATED DIFF
ABS. BASOPHILS: 0 10*3/uL (ref 0.0–0.1)
ABS. EOSINOPHILS: 0.2 10*3/uL (ref 0.0–0.4)
ABS. LYMPHOCYTES: 1.9 10*3/uL (ref 0.8–3.5)
ABS. MONOCYTES: 0.8 10*3/uL (ref 0.0–1.0)
ABS. NEUTROPHILS: 4.7 10*3/uL (ref 1.8–8.0)
BASOPHILS: 0 % (ref 0–1)
EOSINOPHILS: 2 % (ref 0–7)
HCT: 26.6 % — ABNORMAL LOW (ref 36.6–50.3)
HGB: 8.9 g/dL — ABNORMAL LOW (ref 12.1–17.0)
LYMPHOCYTES: 25 % (ref 12–49)
MCH: 29.3 PG (ref 26.0–34.0)
MCHC: 33.5 g/dL (ref 30.0–36.5)
MCV: 87.5 FL (ref 80.0–99.0)
MONOCYTES: 10 % (ref 5–13)
NEUTROPHILS: 63 % (ref 32–75)
PLATELET: 196 10*3/uL (ref 150–400)
RBC: 3.04 M/uL — ABNORMAL LOW (ref 4.10–5.70)
RDW: 16.8 % — ABNORMAL HIGH (ref 11.5–14.5)
WBC: 7.5 10*3/uL (ref 4.1–11.1)

## 2009-06-07 LAB — HEPATITIS C AB
Hep C virus Ab Interp.: NEGATIVE
Hepatitis C virus Ab: 0.19 Index

## 2009-06-07 LAB — PROTHROMBIN TIME + INR
INR: 1.2 — ABNORMAL HIGH (ref 0.9–1.1)
Prothrombin time: 12.7 SECS — ABNORMAL HIGH (ref 9.0–11.0)

## 2009-06-07 LAB — MAGNESIUM: Magnesium: 2.1 MG/DL (ref 1.6–2.4)

## 2009-06-08 LAB — METABOLIC PANEL, BASIC
Anion gap: 5 mmol/L (ref 5–15)
BUN/Creatinine ratio: 19 (ref 12–20)
BUN: 36 MG/DL — ABNORMAL HIGH (ref 6–20)
CO2: 31 MMOL/L (ref 21–32)
Calcium: 8.2 MG/DL — ABNORMAL LOW (ref 8.5–10.1)
Chloride: 100 MMOL/L (ref 97–108)
Creatinine: 1.9 MG/DL — ABNORMAL HIGH (ref 0.6–1.3)
GFR est AA: 45 mL/min/{1.73_m2} — ABNORMAL LOW (ref >60)
GFR est non-AA: 38 mL/min/{1.73_m2} — ABNORMAL LOW (ref >60)
Glucose: 109 MG/DL — ABNORMAL HIGH (ref 65–100)
Potassium: 4 MMOL/L (ref 3.5–5.1)
Sodium: 136 MMOL/L (ref 136–145)

## 2009-06-08 LAB — CBC WITH AUTOMATED DIFF
ABS. BASOPHILS: 0.1 10*3/uL (ref 0.0–0.1)
ABS. EOSINOPHILS: 0.2 10*3/uL (ref 0.0–0.4)
ABS. LYMPHOCYTES: 2.3 10*3/uL (ref 0.8–3.5)
ABS. MONOCYTES: 0.8 10*3/uL (ref 0.0–1.0)
ABS. NEUTROPHILS: 4.2 10*3/uL (ref 1.8–8.0)
BASOPHILS: 1 % (ref 0–1)
EOSINOPHILS: 2 % (ref 0–7)
HCT: 26.5 % — ABNORMAL LOW (ref 36.6–50.3)
HGB: 8.7 g/dL — ABNORMAL LOW (ref 12.1–17.0)
LYMPHOCYTES: 30 % (ref 12–49)
MCH: 28.8 PG (ref 26.0–34.0)
MCHC: 32.8 g/dL (ref 30.0–36.5)
MCV: 87.7 FL (ref 80.0–99.0)
MONOCYTES: 11 % (ref 5–13)
NEUTROPHILS: 56 % (ref 32–75)
PLATELET: 177 10*3/uL (ref 150–400)
RBC: 3.02 M/uL — ABNORMAL LOW (ref 4.10–5.70)
RDW: 17 % — ABNORMAL HIGH (ref 11.5–14.5)
WBC COMMENTS: REACTIVE
WBC: 7.6 10*3/uL (ref 4.1–11.1)

## 2009-06-08 LAB — FREE LIGHT CHAINS, KAPPA/LAMBDA, QT
K:L Free Light Chain ratio: 0.26 (ref 0.26–1.65)
KAPPA QT, free lt chains: 3.19 mg/dL — ABNORMAL HIGH (ref 0.33–1.94)
LAMBDA QT, free lt chains: 12.3 mg/dL — ABNORMAL HIGH (ref 0.57–2.63)

## 2009-06-08 LAB — PHOSPHORUS: Phosphorus: 4.1 MG/DL (ref 2.5–4.9)

## 2009-06-08 LAB — ANA BY MULTIPLEX FLOW IA, QL: ANA, Direct: NOT DETECTED

## 2009-06-08 LAB — COMPLEMENT, C4: COMPLEMENT,C4: 40 mg/dL (ref 10–40)

## 2009-06-08 LAB — GLUCOSE, POC
Glucose (POC): 223 mg/dL — ABNORMAL HIGH (ref 75–110)
Glucose (POC): 60 mg/dL — ABNORMAL LOW (ref 75–110)
Glucose (POC): 63 mg/dL — ABNORMAL LOW (ref 75–110)
Glucose (POC): 97 mg/dL (ref 75–110)
Glucose (POC): 327 mg/dL — ABNORMAL HIGH (ref 75–110)

## 2009-06-08 LAB — ALBUMIN: Albumin: 2.5 g/dL — ABNORMAL LOW (ref 3.5–5.0)

## 2009-06-08 LAB — COMPLEMENT, C3: Complement C3: 145 mg/dL (ref 88–201)

## 2009-06-08 LAB — MAGNESIUM: Magnesium: 2.1 MG/DL (ref 1.6–2.4)

## 2009-06-08 LAB — PROTEIN, TOTAL: Protein, total: 7.3 g/dL (ref 6.4–8.2)

## 2009-06-08 LAB — ANCA PANEL: Neutrophil Cytoplasmic Ab: <1:20 {titer}

## 2009-06-08 LAB — HEP B SURFACE AG: Hep B surface Ag: NEGATIVE

## 2009-06-08 NOTE — Telephone Encounter (Signed)
Travis [name] from [facility] was calling regarding the pt no showing to his mid, dec. Apptw/ dr. Alonna Minium. She stated the is mor than happy to see him, but they wanted to know if the pt decided not to be seen, pls call sally @ [phone] ext [phone]

## 2009-06-08 NOTE — Telephone Encounter (Signed)
Will let dr Welton Flakes know that pt was a no show.

## 2009-06-08 NOTE — Telephone Encounter (Addendum)
He's in the hospital right now and has been seeing Dr. Lucia Estelle.  He will need FU with Sherrine Maples when he is discharged.  Thanks,  sk

## 2009-06-09 MED ORDER — CLONIDINE 0.1 MG TAB
0.1 mg | ORAL_TABLET | Freq: Three times a day (TID) | ORAL | Status: DC
Start: 2009-06-09 — End: 2009-06-26

## 2009-06-09 NOTE — Discharge Summary (Addendum)
Name: Travis [name], Travis [name] Admitted: 05/31/2009  MR #: [id number] Discharged: 06/08/2009  Account #: [id number] DOB: [birth year]  Physician: Wendie Agreste, MD Age 69     DISCHARGE SUMMARY      ADDENDUM    This is an addendum to Discharge Summary dictated on January 9 by Dr. Kerin Salen. Please refer to that for details of hospital course.    PRIMARY CARE PHYSICIAN: Gwendolyn Fill [facility]    DISCHARGE DISPOSITION: Home.    FINAL DIAGNOSES  1. Acute non-ST-elevation myocardial infarction, status post cardiac  catheterization and percutaneous coronary intervention to right coronary  artery.  2. Acute respiratory failure.  3. Decompensated acute on chronic diastolic congestive heart failure.  4. Nonketotic hyperosmolar hyperglycemia.  5. Acute on chronic kidney disease.  6. Hematuria.  7. Anemia.  8. Heart block, Wenckebach.  9. Acute bronchitis.  10. Benign prostatic hypertrophy.  11. Hypertension.    DISCHARGE MEDICATIONS  1. Metoprolol 25 mg twice daily.  2. Bumex 2 mg daily. May take extra one in the afternoon if legs are  swelling.  3. Lisinopril 20 mg daily.  4. Hydralazine 100 mg 3 times daily.  5. Isosorbide mononitrate 60 mg daily.  6. Terazosin 10 mg daily.  7. Aspirin 81 mg daily.  8. Plavix 75 mg daily.  9. Clonidine 0.1 mg 3 times daily.  10. Amlodipine 10 mg daily.  11. Crestor 20 mg daily.  12. Lantus 42 units daily.  13. NovoLog before meals as prior to admission.  14. Levothyroxine 150 mcg daily.  15. Zantac 150 mg daily.  16. Fish oil twice daily.  17. Misoprostol 200 mcg 3 times daily.    DISCHARGE DIET: Low salt, 1800 ADA diet.    FOLLOWUP  1. Follow up at [facility] in 1 week. Repeat CBC, BMP at the visit and  follow up on his diabetes and hypertension.  2. Follow up with Dr. Laddie Aquas, cardiologist, on Wednesday, June 14, 2009.  3. Follow up with Urology, Dr. Chalmers Guest, in 4 weeks for hematuria for  outpatient cystoscopy.  4. Follow up with Dr. Sherrine Maples, nephrologist, as scheduled.   5. Please follow up on his pending serology, including complement, protein  electrophoresis, and other ordered by Dr. Lucia Estelle.  6. Follow up with GI, Dr. Nedra Hai, in 4 weeks for investigation of anemia.    CONTINUED HOSPITAL COURSE: For details refer to the dictation earlier by  Dr. Cy Blamer on January 9.  1. The patient went for cardiac catheterization by cardiologist and he had  PCI to the right coronary artery. Kept on Telemetry. After that he had  symptomatic heart block, Wenckebach, so metoprolol dose decreased and will  follow as an outpatient with the cardiologist.  2. Acute on chronic kidney disease. Has been followed by Dr. Lucia Estelle and  his results of serology ordered are still pending as of dictation,  including ANCA, complement, electrophoresis serum and urine. His kidney  function has been stable, creatinine on day of discharge 1.9.  3. For hematuria the patient had a Foley catheter during hospitalization,  was on Lovenox therapeutic dose, Foley was discontinued, his hematuria  cleared, urinalysis followup showed no blood, cytology was negative. Seen  by urologist. He will need outpatient cystoscopy given, also the finding of  bladder wall thickening on the ultrasound scan. He has BPH. I would  continue with his terazosin also which can help with his blood pressure.  4. For his anemia he received Epogen and IV iron  per Nephrology order,  hemoglobin is 8.7 on discharge. He was 10.6 on admission. He will need to  be followed as an outpatient. GI also consulted and seen by Dr. Nedra Hai.  Ordered him occult, however, was not done yet since patient on antiplatelet  therapy, recommended misoprostol for GI protection.  5. Acute bronchitis. He completed a course of treatment.  6. Hypertension. Medication adjustment done by Cardiology and Nephrology  for better control.    LABORATORY RESULTS: Today's labs: Hemoglobin 8.7, hematocrit 26.5. Sodium   136, potassium 4, chloride 100, CO2 of 31, glucose 109, BUN 36, creatinine  1.9. The patient's hemoglobin A1C is 9.8. He will need to follow with his  physician for his diabetes control.    PHYSICAL EXAMINATION  GENERAL: Today on physical exam the patient denies any complaint, keen to  go home. Is alert, oriented.  VITAL SIGNS: Temperature 98.4, pulse rate 75, respiratory rate 18,  HEART: S1, S2.  CHEST: Clear to auscultation.  ABDOMEN: Soft, nontender. Bowel sounds present.  EXTREMITIES: Trace bilateral leg edema.    Total time spent on patient discharge 45 minutes.    As mentioned pending results serum and urine protein electrophoresis,  complement, and serology all done by nephrologist.            Reviewed on 06/09/2009 3:43 PM          E-Signed By  Wendie Agreste, MD 06/16/2009 10:41    Wendie Agreste, MD    cc: Wendie Agreste, MD   Rosalyn Gess. Chalmers Guest, M.D.   Laureen Ochs, M.D.   Tharon Aquas, MD   Ian Malkin, M.D.    [facility]    MF/WMX; D: 06/08/2009 10:25 A; T: 06/09/2009 8:01 A; DOC# [id number]; Job#  [id number]

## 2009-06-10 LAB — PROTEIN ELECTROPHORESIS + IFE, UR, 24HR
Albumin,urine: DETECTED
Alpha-1 Globulins,urine: DETECTED
Alpha-2 Globulins,urine: DETECTED
Beta globulins,urine: DETECTED
FREE K/L RATIO: 2.22 ratio (ref 2.04–10.37)
FREE KAPPA EXCRETION: 94.6 mg/d
FREE KAPPA LT CHAINS: 4.73 mg/dL — ABNORMAL HIGH (ref 0.14–2.42)
FREE LAMBDA EXCRETION: 42.6 mg/d
FREE LAMBDA LT CHAINS: 2.13 mg/dL — ABNORMAL HIGH (ref 0.02–0.67)
Gamma globulins,urine: DETECTED
Period of collection: 24 hr
Total Protein: 855 mg/d — ABNORMAL HIGH (ref 10–140)
Total volume: 2000 mL

## 2009-06-12 ENCOUNTER — Encounter

## 2009-06-12 NOTE — Telephone Encounter (Addendum)
Left sally a message to pls reschedule him.

## 2009-06-14 LAB — METABOLIC PANEL, BASIC
BUN/Creatinine ratio: 21 (ref 10–22)
BUN: 38 mg/dL — ABNORMAL HIGH (ref 8–27)
CO2: 24 mmol/L (ref 20–32)
Calcium: 8 mg/dL — ABNORMAL LOW (ref 8.6–10.2)
Chloride: 99 mmol/L (ref 97–108)
Creatinine: 1.79 mg/dL — ABNORMAL HIGH (ref 0.76–1.27)
GFR est AA: 46 mL/min/{1.73_m2} — ABNORMAL LOW (ref >59)
GFR est non-AA: 38 mL/min/{1.73_m2} — ABNORMAL LOW (ref >59)
Glucose: 45 mg/dL — ABNORMAL LOW (ref 65–99)
Potassium: 4.4 mmol/L (ref 3.5–5.2)
Sodium: 139 mmol/L (ref 135–145)

## 2009-06-14 LAB — CBC WITH AUTOMATED DIFF
ABS. BASOPHILS: 0 10*3/uL (ref 0.0–0.2)
ABS. EOSINOPHILS: 0.1 10*3/uL (ref 0.0–0.4)
ABS. IMM. GRANS.: 0 10*3/uL (ref 0.0–0.1)
ABS. LYMPHOCYTES: 1.4 10*3/uL (ref 0.7–4.5)
ABS. MONOCYTES: 0.6 10*3/uL (ref 0.1–1.0)
ABS. NEUTROPHILS: 4.7 10*3/uL (ref 1.8–7.8)
BASOPHILS: 0 % (ref 0–3)
EOSINOPHILS: 1 % (ref 0–7)
HCT: 27.8 % — ABNORMAL LOW (ref 36.0–50.0)
HGB: 9 g/dL — ABNORMAL LOW (ref 12.5–17.0)
IMMATURE GRANULOCYTES: 0 % (ref 0–1)
LYMPHOCYTES: 21 % (ref 14–46)
MCH: 29.6 pg (ref 27.0–34.0)
MCHC: 32.4 g/dL (ref 32.0–36.0)
MCV: 91 fL (ref 80–98)
MONOCYTES: 8 % (ref 4–13)
NEUTROPHILS: 70 % (ref 40–74)
PLATELET: 269 10*3/uL (ref 140–415)
RBC: 3.04 x10E6/uL — ABNORMAL LOW (ref 4.10–5.60)
RDW: 17.1 % — ABNORMAL HIGH (ref 11.7–15.0)
WBC: 6.8 10*3/uL (ref 4.0–10.5)

## 2009-06-14 NOTE — Progress Notes (Addendum)
Pt has appt this pm

## 2009-06-14 NOTE — Progress Notes (Signed)
[place] - [place]el [place] ([place])    History of Present Illness:  Feels weak when first standing up, but then after a couple of steps he feels fine.  No lightheadedness or near syncope. Has had chronic vision problems, more so since discharge.  Feels blood sugar may be down - glucose 116 this morning. Weight 198 today, the first time he checked since DC.  No chest pain or SOB.  His chronic LE edema is stable.    Past Medical History   Diagnosis Date   ??? CAD (coronary artery disease)      see problem list   ??? HTN (hypertension)    ??? PVD (peripheral vascular disease)    ??? DM (diabetes mellitus)    ??? Hyperlipidemia    ??? Sleep apnea      on CPAP   ??? COPD (chronic obstructive pulmonary disease)      mild COPD, recurrent pneumonia, chronic bronchitis (followed by Dr. Leroy Libman)   ??? CKD (chronic kidney disease)      Cr 1.7 in 10/09   ??? CHF (congestive heart failure)    ??? Anemia        Problem List Date Reviewed: 06/15/2009      Class    CHF (congestive heart failure) [428.0R]     Overview     Preserved LV function          CAD (coronary artery disease) [414.00AE]     Overview     Cardiac History:   Travis [name] has known coronary artery disease dating back to May 1999 when he underwent cardiac catheterization prior to planned peripheral vascular surgery. This showed distal disease in the apical LAD and right coronary artery with a normal ejection fraction.Non q MI in 7/06.cath showed mod LAD abn RCA disease up to 50-60% and a small occluded LCX with normal LVEF.  Had elevated troponin (?NSTEMI) during admission with pneumonia and ARF 05/2009 - proceeded to have a PCI   (DES) to RCA.    Prior Cardiac Eval:   1) stress test 02/09/07. The type of test was a dobutamine stress perfusion imaging. Cardiac stress testing was negative for chest pain and myocardial ischemia. Myocardial perfusion imaging showed reversible defect(s) involving the inferior wall. Gated EF (%): 55.    2) echocardiogram 01/19/07. Echocardiogram demonstrated normal LV wall motion and ejection fraction, LVH and left atrial enlargement.   3) Carotid Dopplers in July 2009 showed bilateral 50-79% stenosis.   4) Carotids 07/08/08 - 50-79% stenosis bilat  5) Echo 04/03/09 - TDS, moderate LVH, LVEF 65%; moderate LAE, RAE, mild AS (mean gradient 10), mild MR  6) Echo 1/11 - mild LVH, LAE, EF 55%, mild Pulm HTN, mild MR  7) Cath 1/11- DES to RCA                  Current outpatient prescriptions   Medication Sig Dispense Refill   ??? bumetanide (BUMEX) 2 mg tablet Take 2 mg by mouth daily.       ??? lisinopril (PRINIVIL, ZESTRIL) 20 mg tablet Take  by mouth daily.       ??? clopidogrel (PLAVIX) 75 mg tablet Take  by mouth daily.       ??? amlodipine (NORVASC) 10 mg tablet Take  by mouth daily.       ??? rosuvastatin (CRESTOR) 20 mg tablet Take 20 mg by mouth daily.       ??? misoprostol (CYTOTEC) 200 mcg tablet Take 200 mcg by mouth  three (3) times daily.       ??? clonidine (CATAPRES) 0.1 mg tablet Take 1 Tab by mouth three (3) times daily.  90 Tab  6   ??? terazosin (HYTRIN) 5 mg capsule Take 10 mg by mouth nightly.       ??? isosorbide mononitrate ER (IMDUR) 60 mg CR tablet Take 1 Tab by mouth every morning.  30 Tab  6   ??? aspirin 81 mg tablet Take  by mouth daily.       ??? omega-3 fatty acids-vitamin e (FISH OIL) 1,000 mg Cap Take  by mouth two (2) times a day.       ??? hydrALAZINE (APRESOLINE) 25 mg tablet Take 100 mg by mouth three (3) times daily.       ??? insulin glargine (LANTUS) 100 unit/mL injection 42 Units by SubCUTAneous route once.       ??? levothyroxine (SYNTHROID) 150 mcg tablet Take  by mouth daily (before breakfast).       ??? METOPROLOL TARTRATE PO Take 25 mg by mouth two (2) times a day.       ??? nitroglycerin (NITROSTAT) 0.4 mg SL tablet by SubLINGual route every five (5) minutes as needed.       ??? insulin aspart (NOVOLOG) 100 unit/mL injection by SubCUTAneous route.        ??? ranitidine hcl (ZANTAC) 150 mg capsule Take 150 mg by mouth two (2) times a day.           No Known Allergies    Social History:   Patient does not smoke. Denies problems with alcohol.     Review of Systems:   Constitutional: Negative for fever, chills, weight loss, malaise/fatigue and diaphoresis.   HEENT: Negative for nosebleeds, congestion, neck pain, tinnitus, and vision changes.   Respiratory: Negative for hemoptysis, sputum production, and wheezing.   Gastrointestinal: Negative for nausea, vomiting, abdominal pain, diarrhea, constipation, blood in stool and melena.   Genitourinary: Negative for dysuria, urgency, frequency and hematuria.   Musculoskeletal: Negative for myalgias.   Skin: Negative for rash and itching.   Heme: Does not bleed or bruise easily.   Neurological: Negative for speech change and focal weakness.     Physical Exam:   BP 114/60   Pulse 72   Wt 204 lb (92.534 kg)  Orthostatic negative (122/60 p74 supine; 114/56 p74 standing)  Patient appears generally well, mood and affect are appropriate and pleasant.   HEENT: Normocephalic, atraumatic.   Neck Exam: Supple, Left carotid bruit.   Lung Exam: Clear to auscultation, even breath sounds.   Cardiac Exam: Regular rate and rhythm with 2/6 systolic murmur. No rub or S3.  Abdomen: Soft, non-tender, normal bowel sounds. No bruits or masses.   Extremities: 1+ lower extremity edema to knees.   Vascular: difficult to palpate pedis pulses bilaterally.     Lab Results   Component Value Date/Time    WBC 6.8 06/13/2009 12:00 AM    Hemoglobin (POC) 9.2 05/30/2009 11:18 PM    HGB 9.0 06/13/2009 12:00 AM    Hematocrit (POC) 27 05/30/2009 11:18 PM    HCT 27.8 06/13/2009 12:00 AM    PLATELET 269 06/13/2009 12:00 AM    MCV 91 06/13/2009 12:00 AM         Lab Results   Component Value Date/Time    Sodium 139 06/13/2009 12:00 AM    Potassium 4.4 06/13/2009 12:00 AM    Chloride 99 06/13/2009 12:00 AM    CO2  24 06/13/2009 12:00 AM    Anion gap 5 06/08/2009  4:20 AM     Glucose 45 06/13/2009 12:00 AM    BUN 38 06/13/2009 12:00 AM    Creatinine 1.79 06/13/2009 12:00 AM    BUN/Creatinine ratio 21 06/13/2009 12:00 AM    GFR est non-AA 38 06/13/2009 12:00 AM    Calcium 8.0 06/13/2009 12:00 AM    GFR est AA 46 06/13/2009 12:00 AM         Lab Results   Component Value Date/Time    Cholesterol, total 119 05/31/2009  2:38 AM    HDL Cholesterol 66 05/31/2009  2:38 AM    LDL, calculated 42.2 05/31/2009  2:38 AM    Triglyceride 54 05/31/2009  2:38 AM    CHOL/HDL Ratio 1.8 05/31/2009  2:38 AM         Assessment and Plan:  1) Diastolic CHF - continue with diuretics; volume status appears stable and overall improved; counseled him to watch salt intake and follow weight at home  2) CAD - plavix to continue for at least one year; no recurrent CP  3) CKD - Cr stable yesterday; is to FU with renal  4) Weakness - very brief orthostatic type symptoms.  I discussed decreasing some anti-hypertensives as few were changed lately but Travis [name] would prefer to remain on regimen and see how he does.    5) HTN - BP seems controlled.  Some meds were changed lately and there is some confusion regarding what meds he is taking.  I've asked him to call in with his exact med list.  Also, I've asked him to follow his BP at home and call us with those results.  6) Heart Block - he had a lot of Wenckebach noted during hospital stay and we cut back his negative chronotropic meds - he seems to be doing well without symptoms of bradycardia.   7) Anemia - Hgb stable; likely due to chronic disease; is to FU with GI  8) Diabetes - is to FU with endocrine  9) Vision complaints - is to FU with opthomology  10) RTC in 3 months      Tharon Aquas, MD, [facility]

## 2009-06-14 NOTE — Progress Notes (Addendum)
Quick Note:    Pt notified. Has appt today.  ______

## 2009-06-21 LAB — CBC WITH AUTOMATED DIFF
ABS. BASOPHILS: 0 10*3/uL (ref 0.0–0.1)
ABS. EOSINOPHILS: 0 10*3/uL (ref 0.0–0.4)
ABS. LYMPHOCYTES: 2.1 10*3/uL (ref 0.8–3.5)
ABS. MONOCYTES: 0.6 10*3/uL (ref 0.0–1.0)
ABS. NEUTROPHILS: 4.8 10*3/uL (ref 1.8–8.0)
BASOPHILS: 0 % (ref 0–1)
EOSINOPHILS: 1 % (ref 0–7)
HCT: 30.8 % — ABNORMAL LOW (ref 36.6–50.3)
HGB: 10.1 g/dL — ABNORMAL LOW (ref 12.1–17.0)
LYMPHOCYTES: 28 % (ref 12–49)
MCH: 29.2 PG (ref 26.0–34.0)
MCHC: 32.8 g/dL (ref 30.0–36.5)
MCV: 89 FL (ref 80.0–99.0)
MONOCYTES: 8 % (ref 5–13)
NEUTROPHILS: 63 % (ref 32–75)
PLATELET: 176 10*3/uL (ref 150–400)
RBC: 3.46 M/uL — ABNORMAL LOW (ref 4.10–5.70)
RDW: 17.2 % — ABNORMAL HIGH (ref 11.5–14.5)
WBC: 7.5 10*3/uL (ref 4.1–11.1)

## 2009-06-21 LAB — SED RATE (ESR): Sed rate (ESR): 128 MM/HR — ABNORMAL HIGH (ref 0–20)

## 2009-06-21 LAB — POC TROPONIN-I: Troponin-I (POC): <0.04 ng/mL (ref 0.00–0.08)

## 2009-06-21 LAB — URINALYSIS W/ REFLEX CULTURE
Bacteria: NEGATIVE /HPF
Bilirubin: NEGATIVE
Blood: NEGATIVE
Glucose: >1000 MG/DL — AB
Leukocyte Esterase: NEGATIVE
Nitrites: POSITIVE — AB
Protein: 30 MG/DL — AB
Specific gravity: 1.016 (ref 1.003–1.030)
Urobilinogen: 0.2 EU/DL (ref 0.2–1.0)
pH (UA): 5 (ref 5.0–8.0)

## 2009-06-21 LAB — GLUCOSE, POC
Glucose (POC): >600 mg/dL — CR (ref 75–110)
Glucose (POC): 546 mg/dL — ABNORMAL HIGH (ref 75–110)
Glucose (POC): 455 mg/dL — ABNORMAL HIGH (ref 75–110)

## 2009-06-21 LAB — POC CHEM8
Anion gap (POC): 19 — ABNORMAL HIGH (ref 5–15)
BUN (POC): 48 MG/DL — ABNORMAL HIGH (ref 9–20)
CO2 (POC): 20 MMOL/L — ABNORMAL LOW (ref 21–32)
Calcium, ionized (POC): 1.02 MMOL/L — ABNORMAL LOW (ref 1.12–1.32)
Chloride (POC): 101 MMOL/L (ref 98–107)
Creatinine (POC): 2.1 MG/DL — ABNORMAL HIGH (ref 0.6–1.3)
GFRAA, POC: 40 mL/min/{1.73_m2} — ABNORMAL LOW (ref >60)
GFRNA, POC: 33 mL/min/{1.73_m2} — ABNORMAL LOW (ref >60)
Glucose (POC): >700 MG/DL — CR (ref 75–110)
Hematocrit (POC): 30 % — ABNORMAL LOW (ref 36.6–50.3)
Hemoglobin (POC): 10.2 GM/DL — ABNORMAL LOW (ref 12.1–17.0)
Potassium (POC): 4.6 MMOL/L (ref 3.5–5.1)
Sodium (POC): 135 MMOL/L — ABNORMAL LOW (ref 137–145)

## 2009-06-21 LAB — MAGNESIUM: Magnesium: 1.8 MG/DL (ref 1.6–2.4)

## 2009-06-21 LAB — TROPONIN I: Troponin-I, Qt.: 1.4 ng/mL — ABNORMAL HIGH (ref <0.05)

## 2009-06-21 LAB — HEPATIC FUNCTION PANEL
A-G Ratio: 0.8 — ABNORMAL LOW (ref 1.1–2.2)
ALT (SGPT): 27 U/L (ref 12–78)
AST (SGOT): 25 U/L (ref 15–37)
Albumin: 3.4 g/dL — ABNORMAL LOW (ref 3.5–5.0)
Alk. phosphatase: 95 U/L (ref 50–136)
Bilirubin, direct: 0.2 MG/DL (ref 0.0–0.2)
Bilirubin, total: 0.6 MG/DL (ref 0.2–1.0)
Globulin: 4.4 g/dL — ABNORMAL HIGH (ref 2.0–4.0)
Protein, total: 7.8 g/dL (ref 6.4–8.2)

## 2009-06-21 LAB — CK W/ REFLX CKMB: CK: 192 U/L (ref 35–232)

## 2009-06-21 LAB — PROTHROMBIN TIME + INR
INR: 1.2 — ABNORMAL HIGH (ref 0.9–1.1)
Prothrombin time: 12.4 SECS — ABNORMAL HIGH (ref 9.0–11.0)

## 2009-06-21 LAB — ACETONE/KETONE, QL: Acetone/Ketone serum, QL.: POSITIVE — AB

## 2009-06-21 LAB — NT-PRO BNP: NT pro-BNP: 1242 PG/ML — ABNORMAL HIGH (ref 0–125)

## 2009-06-21 NOTE — Consults (Addendum)
Name: Travis [name], Travis [name] Admitted: 06/21/2009  MR #: [id number] DOB: [birth year]  Account #: [id number] Age: 69  Consultant: Kerin Salen, MD Location: [facility]     CONSULTATION REPORT    DATE OF CONSULTATION: 06/21/2009      REFERRING PHYSICIAN: Milda Smart. Kizzie Bane, MD    PRIMARY CARE PHYSICIAN: Dr. Idamae Lusher    REASON FOR REFERRAL: Consult for management of diabetes and other medical  issues.    HISTORY OF PRESENT ILLNESS: This is a 69 year old male who provides a  history of chest discomfort which was sharp, midsternal, and possibly  epigastric which has been waxing and waning since late morning,  approximately for 5 hours ago. He denies any dyspnea. No syncope. In the  cardiac review of systems is essentially negative, otherwise. He reports  increased urinary frequency.    PAST MEDICAL HISTORY  1. Hypertension.  2. Type 2 diabetes.  3. Chronic kidney disease. Baseline creatinine 1.7.  4. Coronary artery disease.  5. Diastolic congestive heart failure.  6. Peripheral vascular disease.  7. COPD.  8. Hypothyroidism.  9. Chronic anemia with baseline hemoglobin of approximately 10.  10. Benign prostatic hypertrophy.  11. History of MRSA skin infection.  12. Osteoarthritis.  13. Recent acute non ST-elevation with status post cardiac catheterization  and PTCA to the right coronary artery on his most recent admission 2 weeks  ago.    MEDICATIONS  1. Metoprolol 25 mg twice daily.  2. Bumex 2 mg daily with an extra dose if he has increased leg swelling.  3. Lisinopril 20 mg daily.  4. Hydralazine 100 mg 3 times daily.  5. Isosorbide mononitrate 60 mg daily.  6. Terazosin 10 mg daily.  7. Aspirin 81 mg daily.  8. Plavix 75 mg daily.  9. Clonidine 0.1 mg 3 times daily.  10. Amlodipine 10 mg daily.  11. Crestor 20 mg daily.  12. Lantus 42 units daily.  13. NovoLog on a sliding scale.  14. Levothyroxine 150 mcg daily.  15. Zantac 150 mg daily.  16. Fish oil twice daily.  17. Misoprostol 200 mcg 3 times daily.     ALLERGIES: NO KNOWN DRUG ALLERGIES.    SOCIAL HISTORY: Remote smoking history 30 years ago. He is not a regular  drinker of alcohol.    FAMILY HISTORY: Per the patient, this is unknown. Both of his parents are  deceased.    REVIEW OF SYSTEMS: Please see the handwritten progress note for  comprehensive review. Also see the HPI above.    PHYSICAL EXAMINATION  VITAL SIGNS: Blood pressure 169/87. Temperature 98, pulse 98, respirations  20, saturation 97% on room air.  HEENT: Normocephalic, atraumatic. Pupils equal, round, reactive to light.  Extraocular movements are intact.  GENERAL: A well-developed, slightly obese male lying in a hospital gurney,  in no acute distress.  NECK: Supple. Full range of motion active and passive. No goiter. Thyroid  nontender. No bruits are noted.  LUNGS: Normal respiration without accessory muscle use. Good air movement  in all fields. Scattered rhonchi are noted.  HEART: Tachycardic with a regular rhythm. A 2/6 systolic murmur is noted  at the left upper sternal border.  ABDOMEN: Soft and nondistended. Normoactive bowel sounds. No organomegaly.  No masses.  EXTREMITIES: No clubbing, cyanosis, or edema. 2+ pulses are noted.  NEUROLOGICAL: Cranial nerves are grossly intact. Light touch sensation in  the extremities is intact.  MUSCULOSKELETAL: Normal range of motion of the major muscle groups without  obvious deformities. Grip strength is  normal and symmetric. Dorsiflexion is  normal and symmetric.    DIAGNOSTIC DATA: On admission showed WBC 7.5 hemoglobin 10.1, platelets  176. Sodium 135, potassium 4.6, chloride 101, CO2 of 20, BUN 98, creatinine  2.1. Glucose greater than 700. Alkaline phosphatase 95. AST 25, ALT 27,  bilirubin 0.6. NT: ProBNP 1242, CPK 192, troponin less than 0.04. INR 1.2,  magnesium 1.8.    Chest x-ray shows no acute cardiopulmonary disease.    EKG shows sinus tachycardia with prolonged QT. Possible junctional   tachycardia. There are no definitive P waves, but the rate appears to be  regular.    ASSESSMENT AND PLAN  1. Uncontrolled diabetes mellitus. Does show greater than 700. Will admit  to the ICU on insulin drip. IVC was okay because they should be able to  manage that there as well. Normal saline with 20 mEq of potassium chloride  at 125 mL an hour. Close care to watch for CHF. Check an A1c in the morning  with labs to monitor calcium and potassium q.4 hours while on the drip.  2. Acute renal failure. Given the prerenal azotemia with a BUN of 48, as  well as the elevated sugars, I suspect this is a prerenal condition and  should respond to fluid hydration. Will monitor and follow. No further  testing as of yet.  3. Hypokalemia. Although the patient's calcium is 4.6, and this is in the  normal range, the reality is that with a sugar of 700 this represents  hypokalemia. We will replete this and follow. Utilize potassium protocol  and magnesium protocol in the ICU.  4. Hypothyroidism. Will continue on Synthroid at the current dosing which  is 150 mg mcg daily.  5. Hyperlipidemia. Per Cardiology protocol.  6. Hypertension. Per Cardiology.  7. Gastroesophageal reflux disease. Protonix IV will be utilized twice  daily given the patient's symptoms.    CODE STATUS: The patient is a FULL CODE.    A total of 70 minutes were spent in evaluation and management of this  patient, of which greater than 30 minutes were in nonoverlapping critical  care.          E-Signed By  Kerin Salen, MD 06/27/2009 08:09    Kerin Salen, MD    cc: Irven Easterly. Idamae Lusher, MD   Kerin Salen, MD        JM/WMX; D: 06/21/2009 5:37 P; T: 06/21/2009 6:57 P; DOC# [id number]; Job#  [id number]

## 2009-06-21 NOTE — Consults (Addendum)
Name: Travis [name], Travis [name] Admitted: 06/21/2009  MR #: [id number] DOB: [birth year]  Account #: [id number] Age: 69  Consultant: Lenox Ponds. Westley Chandler, M.D. Location: [facility]     CONSULTATION REPORT    DATE OF CONSULTATION: 06/21/2009      REASON FOR CONSULTATION: "Acute renal failure, hypokalemia."    HISTORY OF PRESENT ILLNESS: The patient is a 69 year old African American  male who apparently is followed by my partner, Dr. Jilda Roche. The  patient indicates he was in the hospital about 2-3 weeks ago and seen by  Dr. Lucia Estelle at that time. Those records are not currently available. I was  called this evening to see the patient in regards to "acute renal failure  and hypokalemia." The patient indicates that his kidney function was "a  little low," apparently having acute kidney injury around the time of his  last admission. He tells me that "this all improved," so I am taking this  to mean that his acute kidney injury resolved and returned to baseline (?  normal) kidney function at around the time of his discharge. He has been  re-admitted. In regards to his renal issues, he denies any difficulty  initiating or maintaining a stream of urine. He experienced nocturia x5  last night. He does have some symptoms which are vaguely suggestive of  double voiding. He has no known history of prostate disease. He denies any  use of nonsteroidal anti-inflammatory agents prior to admission.    PAST MEDICAL HISTORY: Includes hypertension, diabetes mellitus, chronic  kidney disease (creatinine around 1.7), peripheral vascular disease, CHF,  COPD, hypothyroidism, anemia.    PHYSICAL EXAMINATION  GENERAL: An elderly, rather frail-appearing African American male,  semisupine in bed, in no acute distress.  VITAL SIGNS: Blood pressure 169/55.  HEENT: Normocephalic and atraumatic. Eyes: Show extraocular muscles to be  intact. The sclerae appear anicteric.  NECK: Supple.  LUNGS: Clear to auscultation.   CARDIOVASCULAR: Shows a rhythm which appears to be regular.  ABDOMEN: Obese and soft.  EXTREMITIES: Show 1+ right pretibial edema.  NEUROLOGIC: He is awake, alert, answers questions appropriately.    LABORATORIES: Sodium 135, potassium 4.6, total CO2 101, creatinine 2.1.  Hematocrit 30%. Review of the computerized database reveals serum  creatinine in January 2011 ranging from 3.2-1.7 mg/dL, with a creatinine of  1.9 mg/dL noted on 13 January.    ASSESSMENT AND PLAN  1. Chronic kidney disease.  2. Anemia.  3. Diabetes mellitus.  4. Hypertension.  5. ? Hypokalemia--he has normal kalemia.  6. Acute kidney injury (?)--he has stable chronic kidney disease.    I do not have much to offer this evening, I have reviewed his labs, as  above. Will check a postvoid bladder scan and Dr. ________ will follow up  in a.m., Dr. Sherrine Maples to follow subsequently.          E-Signed By  Lenox Ponds Westley Chandler, M.D. 07/17/2009 15:31    Lenox Ponds. Westley Chandler, M.D.    cc: Lenox Ponds. Westley Chandler, M.D.        CGA/WMX; D: 06/21/2009 9:11 P; T: 06/21/2009 9:28 P; DOC# [id number]; Job#  [id number]

## 2009-06-21 NOTE — Telephone Encounter (Signed)
Travis [name] dtr called to let Dr Welton Flakes know that they were taking her dad to the [facility] ER. He was c/o chest pain and took one NTG SL without relief and he asked to go. I told her that was fine & I would pass this on to Dr Welton Flakes.

## 2009-06-21 NOTE — H&P (Addendum)
Name: Travis [name], Travis [name] Admitted: 06/21/2009  MR #: [id number] DOB: [birth year]  Account #: [id number] Age: 69  Physician: Phineas Semen, MD Location:     HISTORY PHYSICAL      HISTORY: The patient is a 69 year old male who is a vague historian. He  presents to the emergency room having had chest discomfort which he  describes as "sharp" in the mid chest, lower chest and epigastrium starting  around 11 o'clock this morning. It has been going on about 4 hours. It  waxed and waned. He felt a little better after taking a couple  nitroglycerin, but it did not really resolve the pain. He has had minimal  dyspnea. He was a little diaphoretic to begin with. He has had no  palpitations or syncope. He had no PND or orthopnea last night. He has  chronic dyspnea on exertion and fatigability. He has a long history of  coronary disease having had several catheterizations over the years, most  recently presenting with some problems with diastolic failure a couple of  weeks ago, and evaluations ultimately resulting in a catheterization,  finding of a high-grade right coronary lesion, and he had a stent placed in  that. Of note, he did not have any type of pain such as today associated  with those events. He has been doing very little physical activity, but has  not had any obvious reproducible exertional chest pain.    PAST HISTORY: Extensive and complex including  1. Long-standing hypertensive disease.  2. Peripheral vascular disease which has had procedures in the past.  3. Insulin-dependent diabetes.  4. Hyperlipidemia.  5. Sleep apnea.  6. Chronic obstructive pulmonary disease.  7. Chronic kidney disease, which cause problems during his last admission.  8. Chronic anemia.    MEDICATIONS: His medications at his most recent office visit included  1. Bumetanide 2 mg daily.  2. Lisinopril 20 mg daily.  3. Clopidogrel 75 mg daily.  4. Amlodipine 10 mg daily.  5. Rosuvastatin 20 mg daily.  6. Misoprostol 200 mcg t.i.d.   7. Clonidine 0.1 mg t.i.d.  8. Prednisone 5 mg daily.  9. Isosorbide mononitrate 60 mg daily.  10. Aspirin 81 mg daily.  11. Hydralazine 100 mg t.i.d.  12. Varying doses of insulin.  13. L-thyroxine 150 mcg daily.  14. Metoprolol tartrate 25 mg b.i.d.  15. Ranitidine 150 mg b.i.d.    SOCIAL HISTORY: No current tobacco or alcohol.    FAMILY HISTORY: Noncontributory.    REVIEW OF SYSTEMS  GENERAL: Appetite good. No recent weight change.  NEUROLOGIC: Generalized weakness.  EAR/NOSE/THROAT: No tinnitus or vertigo.  RESPIRATORY: No cough, sputum production, or hemoptysis, however did have  cough and a little bit of sputum in the emergency room.  CARDIOVASCULAR: As above.  GI: No abdominal pain, melena, or hematochezia.  GU: No dysuria.    All other systems no reported symptoms.    PHYSICAL EXAMINATION  VITAL SIGNS: Blood pressure 155/60, pulse is 85 and regular. Respiratory  rate 14.  HEENT: Clear conjunctivae. Fundi not seen.  NECK: Supple without neck vein distention. No obvious bruits heard.  CHEST: Symmetric with good bilateral expansion. No dullness, retractions,  rales, or wheezes.  CARDIAC: PMI not palpable. Heart sounds are distant. A 1/6 ejection murmur  at the left sternal border. No definite gallop.  ABDOMEN: Soft. Liver and spleen not palpated.  EXTREMITIES: Trace edema, trace pulses.  NEUROLOGIC: Generalized weakness.    DIAGNOSTICS: His electrocardiogram shows sinus rhythm and nonspecific  ST-T  wave changes. Chest x-ray shows essentially clear lung fields. His initial  troponin is reported as normal. His creatinine is 2.1. Hemoglobin is 10.1.    CLINICAL IMPRESSION  1. Chest pain, unclear etiology, not typical for any of his prior cardiac  pain.  2. Atherosclerotic coronary disease.  3. Hypertensive cardiovascular disease.  4. Extensive peripheral vascular disease.  5. Hyperlipidemia.  6. Insulin-dependent diabetes.  7. Chronic renal insufficiency.  8. Chronic obstructive pulmonary disease.   9. Obstructive sleep apnea.    DISPOSITION: We will place him in the interventional care unit, place him  on some topical nitrates. I am reluctant to up his beta blockers as he had  episodes of Mobitz 2 heart block with higher doses of beta blockers on his  last admission. We are going to have to be very cautious how we proceed,  but I do not think we have evidence to embark on an urgent catheterization  at this point, and the risks would certainly be high given his renal  status. We will have Renal see him and also have the hospitalists help with  management of his diabetes.        E-Signed By  Phineas Semen, MD 06/22/2009 06:37    Phineas Semen, MD    cc: Laureen Ochs, M.D.   Phineas Semen, MD   Tharon Aquas, MD    * Rosanna Randy, MD  * [facility]    DGH/WMX; D: 06/21/2009 3:52 P; T: 06/21/2009 4:41 P; DOC# [id number]; Job#  [id number]

## 2009-06-22 LAB — METABOLIC PANEL, COMPREHENSIVE
A-G Ratio: 0.6 — ABNORMAL LOW (ref 1.1–2.2)
A-G Ratio: 0.7 — ABNORMAL LOW (ref 1.1–2.2)
ALT (SGPT): 29 U/L (ref 12–78)
ALT (SGPT): 30 U/L (ref 12–78)
AST (SGOT): 65 U/L — ABNORMAL HIGH (ref 15–37)
AST (SGOT): 123 U/L — ABNORMAL HIGH (ref 15–37)
Albumin: 3.1 g/dL — ABNORMAL LOW (ref 3.5–5.0)
Albumin: 2.9 g/dL — ABNORMAL LOW (ref 3.5–5.0)
Alk. phosphatase: 94 U/L (ref 50–136)
Alk. phosphatase: 84 U/L (ref 50–136)
Anion gap: 8 mmol/L (ref 5–15)
Anion gap: 8 mmol/L (ref 5–15)
BUN/Creatinine ratio: 23 — ABNORMAL HIGH (ref 12–20)
BUN/Creatinine ratio: 25 — ABNORMAL HIGH (ref 12–20)
BUN: 51 MG/DL — ABNORMAL HIGH (ref 6–20)
BUN: 45 MG/DL — ABNORMAL HIGH (ref 6–20)
Bilirubin, total: 0.3 MG/DL (ref 0.2–1.0)
Bilirubin, total: 0.3 MG/DL (ref 0.2–1.0)
CO2: 28 MMOL/L (ref 21–32)
CO2: 27 MMOL/L (ref 21–32)
Calcium: 8.5 MG/DL (ref 8.5–10.1)
Calcium: 8 MG/DL — ABNORMAL LOW (ref 8.5–10.1)
Chloride: 103 MMOL/L (ref 97–108)
Chloride: 107 MMOL/L (ref 97–108)
Creatinine: 2.2 MG/DL — ABNORMAL HIGH (ref 0.6–1.3)
Creatinine: 1.8 MG/DL — ABNORMAL HIGH (ref 0.6–1.3)
GFR est AA: 38 mL/min/{1.73_m2} — ABNORMAL LOW (ref >60)
GFR est AA: 48 mL/min/{1.73_m2} — ABNORMAL LOW (ref >60)
GFR est non-AA: 32 mL/min/{1.73_m2} — ABNORMAL LOW (ref >60)
GFR est non-AA: 40 mL/min/{1.73_m2} — ABNORMAL LOW (ref >60)
Globulin: 5.1 g/dL — ABNORMAL HIGH (ref 2.0–4.0)
Globulin: 4.1 g/dL — ABNORMAL HIGH (ref 2.0–4.0)
Glucose: 282 MG/DL — ABNORMAL HIGH (ref 65–100)
Glucose: 125 MG/DL — ABNORMAL HIGH (ref 65–100)
Potassium: 3.9 MMOL/L (ref 3.5–5.1)
Potassium: 4.2 MMOL/L (ref 3.5–5.1)
Protein, total: 8.2 g/dL (ref 6.4–8.2)
Protein, total: 7 g/dL (ref 6.4–8.2)
Sodium: 139 MMOL/L (ref 136–145)
Sodium: 142 MMOL/L (ref 136–145)

## 2009-06-22 LAB — LIPID PANEL
CHOL/HDL Ratio: 1.9 (ref 0–5.0)
Cholesterol, total: 106 MG/DL (ref <200)
HDL Cholesterol: 56 MG/DL
LDL, calculated: 42.6 MG/DL (ref 0–100)
Triglyceride: 37 MG/DL (ref <150)
VLDL, calculated: 7.4 MG/DL

## 2009-06-22 LAB — GLUCOSE, POC
Glucose (POC): 308 mg/dL — ABNORMAL HIGH (ref 75–110)
Glucose (POC): 426 mg/dL — ABNORMAL HIGH (ref 75–110)
Glucose (POC): 241 mg/dL — ABNORMAL HIGH (ref 75–110)
Glucose (POC): 114 mg/dL — ABNORMAL HIGH (ref 75–110)
Glucose (POC): 125 mg/dL — ABNORMAL HIGH (ref 75–110)
Glucose (POC): 131 mg/dL — ABNORMAL HIGH (ref 75–110)
Glucose (POC): 140 mg/dL — ABNORMAL HIGH (ref 75–110)
Glucose (POC): 163 mg/dL — ABNORMAL HIGH (ref 75–110)
Glucose (POC): 176 mg/dL — ABNORMAL HIGH (ref 75–110)
Glucose (POC): 64 mg/dL — ABNORMAL LOW (ref 75–110)
Glucose (POC): 73 mg/dL — ABNORMAL LOW (ref 75–110)
Glucose (POC): 115 mg/dL — ABNORMAL HIGH (ref 75–110)
Glucose (POC): 112 mg/dL — ABNORMAL HIGH (ref 75–110)
Glucose (POC): 109 mg/dL (ref 75–110)
Glucose (POC): 224 mg/dL — ABNORMAL HIGH (ref 75–110)
Glucose (POC): 262 mg/dL — ABNORMAL HIGH (ref 75–110)

## 2009-06-22 LAB — METABOLIC PANEL, BASIC
Anion gap: 10 mmol/L (ref 5–15)
BUN/Creatinine ratio: 23 — ABNORMAL HIGH (ref 12–20)
BUN: 50 MG/DL — ABNORMAL HIGH (ref 6–20)
CO2: 27 MMOL/L (ref 21–32)
Calcium: 8.3 MG/DL — ABNORMAL LOW (ref 8.5–10.1)
Chloride: 102 MMOL/L (ref 97–108)
Creatinine: 2.2 MG/DL — ABNORMAL HIGH (ref 0.6–1.3)
GFR est AA: 38 mL/min/{1.73_m2} — ABNORMAL LOW (ref >60)
GFR est non-AA: 32 mL/min/{1.73_m2} — ABNORMAL LOW (ref >60)
Glucose: 288 MG/DL — ABNORMAL HIGH (ref 65–100)
Potassium: 3.9 MMOL/L (ref 3.5–5.1)
Sodium: 139 MMOL/L (ref 136–145)

## 2009-06-22 LAB — CK W/ CKMB & INDEX
CK - MB: 66.5 NG/ML — ABNORMAL HIGH (ref 0.5–3.6)
CK-MB Index: 8.8 — ABNORMAL HIGH (ref 0–2.5)
CK: 754 U/L — ABNORMAL HIGH (ref 35–232)

## 2009-06-22 LAB — CBC WITH AUTOMATED DIFF
ABS. BASOPHILS: 0 10*3/uL (ref 0.0–0.1)
ABS. EOSINOPHILS: 0 10*3/uL (ref 0.0–0.4)
ABS. LYMPHOCYTES: 1.3 10*3/uL (ref 0.8–3.5)
ABS. MONOCYTES: 1 10*3/uL (ref 0.0–1.0)
ABS. NEUTROPHILS: 5.8 10*3/uL (ref 1.8–8.0)
BASOPHILS: 0 % (ref 0–1)
EOSINOPHILS: 1 % (ref 0–7)
HCT: 27.8 % — ABNORMAL LOW (ref 36.6–50.3)
HGB: 8.8 g/dL — ABNORMAL LOW (ref 12.1–17.0)
LYMPHOCYTES: 16 % (ref 12–49)
MCH: 28.3 PG (ref 26.0–34.0)
MCHC: 31.7 g/dL (ref 30.0–36.5)
MCV: 89.4 FL (ref 80.0–99.0)
MONOCYTES: 12 % (ref 5–13)
NEUTROPHILS: 71 % (ref 32–75)
PLATELET: 179 10*3/uL (ref 150–400)
RBC: 3.11 M/uL — ABNORMAL LOW (ref 4.10–5.70)
RDW: 17.1 % — ABNORMAL HIGH (ref 11.5–14.5)
WBC: 8.1 10*3/uL (ref 4.1–11.1)

## 2009-06-22 LAB — HEMOGLOBIN A1C WITH EAG: Hemoglobin A1c: 8.7 % — ABNORMAL HIGH (ref 4.8–6.0)

## 2009-06-22 LAB — TROPONIN I
Troponin-I, Qt.: 15.68 ng/mL — ABNORMAL HIGH (ref <0.05)
Troponin-I, Qt.: 36.05 ng/mL — ABNORMAL HIGH (ref <0.05)

## 2009-06-22 LAB — MAGNESIUM
Magnesium: 1.8 MG/DL (ref 1.6–2.4)
Magnesium: 1.8 MG/DL (ref 1.6–2.4)

## 2009-06-22 LAB — PHOSPHORUS
Phosphorus: 2.3 MG/DL — ABNORMAL LOW (ref 2.5–4.9)
Phosphorus: 3.3 MG/DL (ref 2.5–4.9)

## 2009-06-22 LAB — PTT: aPTT: 30.8 s (ref 24.0–33.0)

## 2009-06-22 LAB — ALBUMIN: Albumin: 3.2 g/dL — ABNORMAL LOW (ref 3.5–5.0)

## 2009-06-22 LAB — TSH 3RD GENERATION: TSH: 1.52 u[IU]/mL (ref 0.36–3.74)

## 2009-06-23 LAB — METABOLIC PANEL, BASIC
Anion gap: 7 mmol/L (ref 5–15)
Anion gap: 9 mmol/L (ref 5–15)
BUN/Creatinine ratio: 17 (ref 12–20)
BUN/Creatinine ratio: 17 (ref 12–20)
BUN: 33 MG/DL — ABNORMAL HIGH (ref 6–20)
BUN: 33 MG/DL — ABNORMAL HIGH (ref 6–20)
CO2: 30 MMOL/L (ref 21–32)
CO2: 28 MMOL/L (ref 21–32)
Calcium: 7.9 MG/DL — ABNORMAL LOW (ref 8.5–10.1)
Calcium: 7.7 MG/DL — ABNORMAL LOW (ref 8.5–10.1)
Chloride: 98 MMOL/L (ref 97–108)
Chloride: 94 MMOL/L — ABNORMAL LOW (ref 97–108)
Creatinine: 2 MG/DL — ABNORMAL HIGH (ref 0.6–1.3)
Creatinine: 2 MG/DL — ABNORMAL HIGH (ref 0.6–1.3)
GFR est AA: 43 mL/min/{1.73_m2} — ABNORMAL LOW (ref >60)
GFR est AA: 43 mL/min/{1.73_m2} — ABNORMAL LOW (ref >60)
GFR est non-AA: 35 mL/min/{1.73_m2} — ABNORMAL LOW (ref >60)
GFR est non-AA: 35 mL/min/{1.73_m2} — ABNORMAL LOW (ref >60)
Glucose: 374 MG/DL — ABNORMAL HIGH (ref 65–100)
Glucose: 509 MG/DL — ABNORMAL HIGH (ref 65–100)
Potassium: 4.3 MMOL/L (ref 3.5–5.1)
Potassium: 4.1 MMOL/L (ref 3.5–5.1)
Sodium: 135 MMOL/L — ABNORMAL LOW (ref 136–145)
Sodium: 131 MMOL/L — ABNORMAL LOW (ref 136–145)

## 2009-06-23 LAB — CBC WITH AUTOMATED DIFF
ABS. BASOPHILS: 0 10*3/uL (ref 0.0–0.1)
ABS. EOSINOPHILS: 0 10*3/uL (ref 0.0–0.4)
ABS. LYMPHOCYTES: 1 10*3/uL (ref 0.8–3.5)
ABS. MONOCYTES: 0.7 10*3/uL (ref 0.0–1.0)
ABS. NEUTROPHILS: 4.6 10*3/uL (ref 1.8–8.0)
BASOPHILS: 0 % (ref 0–1)
EOSINOPHILS: 1 % (ref 0–7)
HCT: 26.7 % — ABNORMAL LOW (ref 36.6–50.3)
HGB: 8.6 g/dL — ABNORMAL LOW (ref 12.1–17.0)
LYMPHOCYTES: 16 % (ref 12–49)
MCH: 28.6 PG (ref 26.0–34.0)
MCHC: 32.2 g/dL (ref 30.0–36.5)
MCV: 88.7 FL (ref 80.0–99.0)
MONOCYTES: 10 % (ref 5–13)
NEUTROPHILS: 73 % (ref 32–75)
PLATELET: 147 10*3/uL — ABNORMAL LOW (ref 150–400)
RBC: 3.01 M/uL — ABNORMAL LOW (ref 4.10–5.70)
RDW: 17.2 % — ABNORMAL HIGH (ref 11.5–14.5)
WBC: 6.3 10*3/uL (ref 4.1–11.1)

## 2009-06-23 LAB — HEPATIC FUNCTION PANEL
A-G Ratio: 0.6 — ABNORMAL LOW (ref 1.1–2.2)
ALT (SGPT): 28 U/L (ref 12–78)
AST (SGOT): 84 U/L — ABNORMAL HIGH (ref 15–37)
Albumin: 2.6 g/dL — ABNORMAL LOW (ref 3.5–5.0)
Alk. phosphatase: 81 U/L (ref 50–136)
Bilirubin, direct: 0.1 MG/DL (ref 0.0–0.2)
Bilirubin, total: 0.4 MG/DL (ref 0.2–1.0)
Globulin: 4.6 g/dL — ABNORMAL HIGH (ref 2.0–4.0)
Protein, total: 7.2 g/dL (ref 6.4–8.2)

## 2009-06-23 LAB — MAGNESIUM: Magnesium: 1.5 MG/DL — ABNORMAL LOW (ref 1.6–2.4)

## 2009-06-23 LAB — CK W/ CKMB & INDEX
CK - MB: 11.7 NG/ML — ABNORMAL HIGH (ref 0.5–3.6)
CK-MB Index: 2.5 (ref 0–2.5)
CK: 471 U/L — ABNORMAL HIGH (ref 35–232)

## 2009-06-23 LAB — GLUCOSE, RANDOM: Glucose: 494 MG/DL — ABNORMAL HIGH (ref 65–100)

## 2009-06-23 LAB — GLUCOSE, POC
Glucose (POC): 499 mg/dL — ABNORMAL HIGH (ref 75–110)
Glucose (POC): 547 mg/dL — ABNORMAL HIGH (ref 75–110)
Glucose (POC): 571 mg/dL — ABNORMAL HIGH (ref 75–110)
Glucose (POC): 447 mg/dL — ABNORMAL HIGH (ref 75–110)
Glucose (POC): 360 mg/dL — ABNORMAL HIGH (ref 75–110)
Glucose (POC): >600 mg/dL — CR (ref 75–110)

## 2009-06-23 LAB — TROPONIN I: Troponin-I, Qt.: 9.53 ng/mL — ABNORMAL HIGH (ref <0.05)

## 2009-06-24 LAB — GLUCOSE, POC
Glucose (POC): 225 mg/dL — ABNORMAL HIGH (ref 75–110)
Glucose (POC): 296 mg/dL — ABNORMAL HIGH (ref 75–110)
Glucose (POC): 87 mg/dL (ref 75–110)
Glucose (POC): 309 mg/dL — ABNORMAL HIGH (ref 75–110)
Glucose (POC): 133 mg/dL — ABNORMAL HIGH (ref 75–110)

## 2009-06-24 LAB — HEPATIC FUNCTION PANEL
A-G Ratio: 0.6 — ABNORMAL LOW (ref 1.1–2.2)
ALT (SGPT): 27 U/L (ref 12–78)
AST (SGOT): 65 U/L — ABNORMAL HIGH (ref 15–37)
Albumin: 2.7 g/dL — ABNORMAL LOW (ref 3.5–5.0)
Alk. phosphatase: 85 U/L (ref 50–136)
Bilirubin, direct: 0.2 MG/DL (ref 0.0–0.2)
Bilirubin, total: 0.5 MG/DL (ref 0.2–1.0)
Globulin: 4.8 g/dL — ABNORMAL HIGH (ref 2.0–4.0)
Protein, total: 7.5 g/dL (ref 6.4–8.2)

## 2009-06-24 LAB — MAGNESIUM: Magnesium: 1.9 MG/DL (ref 1.6–2.4)

## 2009-06-24 LAB — CBC WITH AUTOMATED DIFF
ABS. BASOPHILS: 0 10*3/uL (ref 0.0–0.1)
ABS. EOSINOPHILS: 0.1 10*3/uL (ref 0.0–0.4)
ABS. LYMPHOCYTES: 1.2 10*3/uL (ref 0.8–3.5)
ABS. MONOCYTES: 0.6 10*3/uL (ref 0.0–1.0)
ABS. NEUTROPHILS: 4 10*3/uL (ref 1.8–8.0)
BASOPHILS: 0 % (ref 0–1)
EOSINOPHILS: 2 % (ref 0–7)
HCT: 27.5 % — ABNORMAL LOW (ref 36.6–50.3)
HGB: 9 g/dL — ABNORMAL LOW (ref 12.1–17.0)
LYMPHOCYTES: 20 % (ref 12–49)
MCH: 29 PG (ref 26.0–34.0)
MCHC: 32.7 g/dL (ref 30.0–36.5)
MCV: 88.7 FL (ref 80.0–99.0)
MONOCYTES: 11 % (ref 5–13)
NEUTROPHILS: 67 % (ref 32–75)
PLATELET: 131 10*3/uL — ABNORMAL LOW (ref 150–400)
RBC: 3.1 M/uL — ABNORMAL LOW (ref 4.10–5.70)
RDW: 16.8 % — ABNORMAL HIGH (ref 11.5–14.5)
WBC: 5.9 10*3/uL (ref 4.1–11.1)

## 2009-06-24 LAB — METABOLIC PANEL, BASIC
Anion gap: 6 mmol/L (ref 5–15)
BUN/Creatinine ratio: 17 (ref 12–20)
BUN: 34 MG/DL — ABNORMAL HIGH (ref 6–20)
CO2: 32 MMOL/L (ref 21–32)
Calcium: 8.4 MG/DL — ABNORMAL LOW (ref 8.5–10.1)
Chloride: 99 MMOL/L (ref 97–108)
Creatinine: 2 MG/DL — ABNORMAL HIGH (ref 0.6–1.3)
GFR est AA: 43 mL/min/{1.73_m2} — ABNORMAL LOW (ref >60)
GFR est non-AA: 35 mL/min/{1.73_m2} — ABNORMAL LOW (ref >60)
Glucose: 86 MG/DL (ref 65–100)
Potassium: 3.6 MMOL/L (ref 3.5–5.1)
Sodium: 137 MMOL/L (ref 136–145)

## 2009-06-25 LAB — POTASSIUM: Potassium: 3.5 MMOL/L (ref 3.5–5.1)

## 2009-06-25 LAB — GLUCOSE, POC
Glucose (POC): 111 mg/dL — ABNORMAL HIGH (ref 75–110)
Glucose (POC): 158 mg/dL — ABNORMAL HIGH (ref 75–110)
Glucose (POC): 325 mg/dL — ABNORMAL HIGH (ref 75–110)

## 2009-06-25 LAB — MAGNESIUM: Magnesium: 1.7 MG/DL (ref 1.6–2.4)

## 2009-06-29 NOTE — Progress Notes (Signed)
History of Present Illness:  Travis [name] was admitted with severe chest pain and ruled in for NSTEMI with trop upto 30.  His cath showed a patent stent and similar findings to the end of his cath on 06/05/09.  His echo showed normal systolic function and wall motion.   Travis [name] pain resolved in the hospital and he has done well since DC on Sunday.  His BP has been stable (clonidine increased) and he has felt well.    Past Medical History   Diagnosis Date   ??? CAD (coronary artery disease)      see problem list   ??? HTN (hypertension)    ??? PVD (peripheral vascular disease)    ??? DM (diabetes mellitus)    ??? Hyperlipidemia    ??? Sleep apnea      on CPAP   ??? COPD (chronic obstructive pulmonary disease)      mild COPD, recurrent pneumonia, chronic bronchitis (followed by Dr. Leroy Libman)   ??? CKD (chronic kidney disease)      Cr 1.7 in 10/09   ??? CHF (congestive heart failure)    ??? Anemia        Problem List Date Reviewed: 06/29/2009      Class    CHF (congestive heart failure) [428.0R]     Overview     Preserved LV function          CAD (coronary artery disease) [414.00AE]     Overview     Cardiac History:   Travis [name] has known coronary artery disease dating back to May 1999 when he underwent cardiac catheterization prior to planned peripheral vascular surgery. This showed distal disease in the apical LAD and right coronary artery with a normal ejection fraction.Non q MI in 7/06.cath showed mod LAD abn RCA disease up to 50-60% and a small occluded LCX with normal LVEF.  Had elevated troponin (?NSTEMI) during admission with pneumonia and ARF 05/2009 - proceeded to have a PCI   (DES) to RCA.    Prior Cardiac Eval:   1) stress test 02/09/07. The type of test was a dobutamine stress perfusion imaging. Cardiac stress testing was negative for chest pain and myocardial ischemia. Myocardial perfusion imaging showed reversible defect(s) involving the inferior wall. Gated EF (%): 55.    2) echocardiogram 01/19/07. Echocardiogram demonstrated normal LV wall motion and ejection fraction, LVH and left atrial enlargement.   3) Carotid Dopplers in July 2009 showed bilateral 50-79% stenosis.   4) Carotids 07/08/08 - 50-79% stenosis bilat  5) Echo 04/03/09 - TDS, moderate LVH, LVEF 65%; moderate LAE, RAE, mild AS (mean gradient 10), mild MR  6) Echo 1/11 - mild LVH, LAE, EF 55%, mild Pulm HTN, mild MR  7) Cath 06/05/09- DES to RCA  8) cath 06/22/09 - LAD 50%, LCX 100%, mid RCA patent stent, distal RCA 40%  9) Echo 06/22/09 - mild LVH, EF 55-60%, mild LAE, mild MR/TR, PASP 60                  Current outpatient prescriptions   Medication Sig Dispense Refill   ??? clonidine (CATAPRESS) 0.2 mg tablet Take  by mouth three (3) times daily.       ??? bumetanide (BUMEX) 2 mg tablet Take 2 mg by mouth two (2) times a day.       ??? lisinopril (PRINIVIL, ZESTRIL) 20 mg tablet Take  by mouth daily.       ??? clopidogrel (PLAVIX) 75 mg tablet Take  by mouth daily.       ??? amlodipine (NORVASC) 10 mg tablet Take  by mouth daily.       ??? rosuvastatin (CRESTOR) 20 mg tablet Take 20 mg by mouth daily.       ??? misoprostol (CYTOTEC) 200 mcg tablet Take 200 mcg by mouth three (3) times daily.       ??? terazosin (HYTRIN) 5 mg capsule Take 10 mg by mouth nightly.       ??? isosorbide mononitrate ER (IMDUR) 60 mg CR tablet Take 1 Tab by mouth every morning.  30 Tab  6   ??? aspirin 81 mg tablet Take  by mouth daily.       ??? omega-3 fatty acids-vitamin e (FISH OIL) 1,000 mg Cap Take  by mouth two (2) times a day.       ??? hydrALAZINE (APRESOLINE) 25 mg tablet Take 100 mg by mouth three (3) times daily.       ??? insulin glargine (LANTUS) 100 unit/mL injection 42 Units by SubCUTAneous route once.       ??? levothyroxine (SYNTHROID) 150 mcg tablet Take  by mouth daily (before breakfast).       ??? METOPROLOL TARTRATE PO Take 25 mg by mouth two (2) times a day.        ??? nitroglycerin (NITROSTAT) 0.4 mg SL tablet by SubLINGual route every five (5) minutes as needed.       ??? insulin aspart (NOVOLOG) 100 unit/mL injection by SubCUTAneous route.       ??? ranitidine hcl (ZANTAC) 150 mg capsule Take 150 mg by mouth two (2) times a day.           No Known Allergies    Social History:   Patient does not smoke. Denies problems with alcohol.     Review of Systems:   Constitutional: Negative for fever, chills, weight loss, malaise/fatigue and diaphoresis.   HEENT: Negative for nosebleeds, congestion, neck pain, tinnitus, and vision changes.   Respiratory: Negative for hemoptysis, sputum production, and wheezing.   Gastrointestinal: Negative for nausea, vomiting, abdominal pain, diarrhea, constipation, blood in stool and melena.   Genitourinary: Negative for dysuria, urgency, frequency and hematuria.   Musculoskeletal: Negative for myalgias.   Skin: Negative for rash and itching.   Heme: Does not bleed or bruise easily.   Neurological: Negative for speech change and focal weakness.     Physical Exam:   BP 130/56   Pulse 72   Wt 198 lb (89.812 kg)   Patient appears generally well, mood and affect are appropriate and pleasant.   HEENT: Normocephalic, atraumatic.   Neck Exam: Supple, Left carotid bruit.   Lung Exam: Clear to auscultation, even breath sounds.   Cardiac Exam: Regular rate and rhythm with 2/6 systolic murmur. No rub or S3.   Abdomen: Soft, non-tender, normal bowel sounds. No bruits or masses.   Extremities: 1+ lower extremity edema to knees.   Vascular: difficult to palpate dorsalis pedis pulses bilaterally.     Assessment and Plan:  1) CAD - I don't have a clear explanation for his NSTEMI a few days ago.  His cath showed stable findings and his LV function remained normal.  Maybe he had a clot which resolved by the time we did the cath? Or spasm?  Going forward will continue with secondary prevention.  2) CKD - Cr stable this past admission   3) HTN - BP acceptable on current regimen  4) RTC in 3  months      Tharon Aquas, MD, [facility]

## 2009-07-13 NOTE — Telephone Encounter (Signed)
Please call pt wants to speak to the nurse [phone]. He is awaiting a return call.  Thanks

## 2009-07-13 NOTE — Telephone Encounter (Signed)
Spoke with the dtr and she was looking for a refill on his cytotec and I told her the cardiologist could not fill that. It has to come from a pcp.

## 2009-07-15 LAB — HEPATIC FUNCTION PANEL
A-G Ratio: 0.7 — ABNORMAL LOW (ref 1.1–2.2)
ALT (SGPT): 28 U/L (ref 12–78)
AST (SGOT): 49 U/L — ABNORMAL HIGH (ref 15–37)
Albumin: 3.6 g/dL (ref 3.5–5.0)
Alk. phosphatase: 98 U/L (ref 50–136)
Bilirubin, direct: <0.1 MG/DL (ref 0.0–0.2)
Bilirubin, total: 0.7 MG/DL (ref 0.2–1.0)
Globulin: 4.9 g/dL — ABNORMAL HIGH (ref 2.0–4.0)
Protein, total: 8.5 g/dL — ABNORMAL HIGH (ref 6.4–8.2)

## 2009-07-15 LAB — CBC WITH AUTOMATED DIFF
ABS. BASOPHILS: 0 10*3/uL (ref 0.0–0.1)
ABS. EOSINOPHILS: 0 10*3/uL (ref 0.0–0.4)
ABS. LYMPHOCYTES: 1.7 10*3/uL (ref 0.8–3.5)
ABS. MONOCYTES: 0.8 10*3/uL (ref 0.0–1.0)
ABS. NEUTROPHILS: 8.3 10*3/uL — ABNORMAL HIGH (ref 1.8–8.0)
BASOPHILS: 0 % (ref 0–1)
EOSINOPHILS: 0 % (ref 0–7)
HCT: 29.2 % — ABNORMAL LOW (ref 36.6–50.3)
HGB: 9.7 g/dL — ABNORMAL LOW (ref 12.1–17.0)
LYMPHOCYTES: 16 % (ref 12–49)
MCH: 29.2 PG (ref 26.0–34.0)
MCHC: 33.2 g/dL (ref 30.0–36.5)
MCV: 88 FL (ref 80.0–99.0)
MONOCYTES: 7 % (ref 5–13)
NEUTROPHILS: 77 % — ABNORMAL HIGH (ref 32–75)
PLATELET: 127 10*3/uL — ABNORMAL LOW (ref 150–400)
RBC: 3.32 M/uL — ABNORMAL LOW (ref 4.10–5.70)
RDW: 16.6 % — ABNORMAL HIGH (ref 11.5–14.5)
WBC: 10.8 10*3/uL (ref 4.1–11.1)

## 2009-07-15 LAB — URINALYSIS W/ REFLEX CULTURE
Bacteria: NEGATIVE /HPF
Bilirubin: NEGATIVE
Blood: NEGATIVE
Glucose: >1000 MG/DL — AB
Ketone: 15 MG/DL — AB
Leukocyte Esterase: NEGATIVE
Nitrites: NEGATIVE
Protein: 30 MG/DL — AB
Specific gravity: 1.015 (ref 1.003–1.030)
Urobilinogen: 0.2 EU/DL (ref 0.2–1.0)
pH (UA): 5 (ref 5.0–8.0)

## 2009-07-15 LAB — LIPASE: Lipase: 82 U/L (ref 73–393)

## 2009-07-15 LAB — NT-PRO BNP: NT pro-BNP: 1868 PG/ML — ABNORMAL HIGH (ref 0–125)

## 2009-07-15 LAB — METABOLIC PANEL, BASIC
Anion gap: 18 mmol/L — ABNORMAL HIGH (ref 5–15)
BUN/Creatinine ratio: 25 — ABNORMAL HIGH (ref 12–20)
BUN: 62 MG/DL — ABNORMAL HIGH (ref 6–20)
CO2: 21 MMOL/L (ref 21–32)
Calcium: 9.6 MG/DL (ref 8.5–10.1)
Chloride: 93 MMOL/L — ABNORMAL LOW (ref 97–108)
Creatinine: 2.5 MG/DL — ABNORMAL HIGH (ref 0.6–1.3)
GFR est AA: 33 mL/min/{1.73_m2} — ABNORMAL LOW (ref >60)
GFR est non-AA: 27 mL/min/{1.73_m2} — ABNORMAL LOW (ref >60)
Glucose: 618 MG/DL — CR (ref 65–100)
Potassium: 4.8 MMOL/L (ref 3.5–5.1)
Sodium: 132 MMOL/L — ABNORMAL LOW (ref 136–145)

## 2009-07-15 LAB — TROPONIN I: Troponin-I, Qt.: 2.56 ng/mL — ABNORMAL HIGH (ref <0.05)

## 2009-07-15 NOTE — H&P (Addendum)
Name: ARIC, JOST Admitted: 07/15/2009  MR #: [id number] DOB: [birth year]  Account #: [id number] Age: 69  Physician: Suzan Garibaldi, MD Location: [facility]     HISTORY PHYSICAL    CHIEF COMPLAINT: Blood in his stool.    HISTORY: This is a 69 year old gentleman who recently underwent stent  placement for coronary artery disease, who presents to the emergency room  complaining of 3 episodes of blood in his stool. He also reports associated  nausea and vomiting but denies any blood in his emesis. He reports having  lower chest and upper abdominal pain. This has been slowly progressing over  the course of the day. There is no radiation into his back. He does report  that it has radiated from the center of his chest to the left of his chest.  He describes it to be sharp in intensity. He reports it to be moderate to  severe. No significant alleviating factors. He denies shortness of breath.  He denies fevers or chills.    PAST MEDICAL HISTORY  1. Coronary artery disease status post MI with recent stent placement of  his RCA.  2. Diabetes.  3. Hypertension.  4. Chronic kidney disease.  5. Diastolic congestive heart failure.  6. Peripheral vascular disease.  7. COPD.  8. Hypothyroidism.  9. Anemia.  10. BPH.    HOME MEDICATIONS  1. Metoprolol 25 mg twice daily.  2. Lisinopril 20 mg daily.  3. Hydralazine 100 mg 3 times daily.  4. Misoprostol 200 mcg 3 times daily.  5. Plavix 75 mg daily.  6. Norvasc 10 mg daily.  7. Bumex 2 mg daily.  8. Crestor 20 mg nightly.  9. Zantac 150 mg daily.  10. Levothyroxine 150 mcg daily.  11. Lantus 42 units daily.  12. Imdur 60 mg daily.  13. Fish oil.  14. Aspirin 81 mg daily.    SOCIAL HISTORY: He currently goes to the [facility] for his medical care.  He quit smoking. He reports occasional alcohol.    FAMILY HISTORY: Both parents are deceased. He does not recall them having  significant medical problems.    REVIEW OF SYSTEMS: Please refer to handwritten H and P for full review of   systems.    PHYSICAL EXAMINATION  VITAL SIGNS: Blood pressure is 142/63, pulse 102, respiratory rate 20,  temperature 98.1, O2 saturation 95% on room air.  GENERAL: He is an age-appearing, 69 year old, in no apparent distress.  HEENT: Head is normocephalic and atraumatic. Extraocular muscles are  intact. Oral mucosa is dry.  NECK: There is no thyromegaly.  CHEST: Lungs are clear to auscultation bilaterally. There are no wheezes,  rales, rhonchi.  CARDIOVASCULAR: Heart rate and rhythm are regular. There are no murmurs,  rubs, or gallops.  ABDOMEN: Bowel sounds are present. Abdomen is soft. There is mild  epigastric tenderness to palpation. There is also tenderness in the lower  sternum and left anterior chest wall.  EXTREMITIES: He has some hyperpigmentation of his lower extremities with  dry scaly skin. There is no edema. Dorsalis pedis pulses are audible using  the echo probe for Doppler.  NEUROLOGIC: Extraocular muscles are intact. Tongue is midline. Face is  symmetric. Oropharynx rises symmetrically. Shoulder shrug is equal  bilaterally. Grip strength is 5/5 bilaterally. Dorsi- and plantar flex  strength is 5/5 bilaterally.  PSYCHIATRIC: He is alert, oriented x3. His affect is appropriate.    LABORATORY DATA: White blood count 10.8, hemoglobin 9.7, platelets 127.  Sodium 132, potassium  4.8, chloride 93, bicarbonate 21, BUN 62, creatinine  2.5, glucose 618. Troponin 2.56. Lipase 82. Urinalysis is negative.    RADIOGRAPHIC DATA: Chest x-ray shows cardiomegaly. EKG shows sinus rhythm  of 98.    ASSESSMENT: This is a 69 year old gentleman with complaints of lower chest  and upper abdominal pain with episodes of rectal bleeding. Will be concern  for possible upper GI source of blood loss, given the Hemoccult-positive  stool as noted by the ER physician. Given his recent stent placement with  MI, also concern for possible acute coronary syndrome. However, the   presence of rectal bleeding cannot anticoagulate at this time.    PLAN  1. GI bleed: With his significantly elevated BUN, would be a concern for a  possible upper GI source, especially with the location of his discomfort  and pain. I have discussed this with Dr. Satira Sark by telephone who will  evaluate the patient today. We will place on Protonix twice daily despite  the patient taking Plavix, given his risk versus benefit at this time. We  will send a sample to the blood bank. We will check serial hemoglobins.  2. Chest pain/abdominal pain: We will order a CT of the chest and abdomen  without contrast. We will check serial cardiac markers. We will continue  metoprolol, Imdur and Norvasc. We will need to continue Plavix given his  recent stent placement. We will use morphine as needed for pain control. We  will continue his misoprostol and Protonix as above.  3. Diabetic ketoacidosis: With anion gap of 18 and blood sugars of greater  than 600, will start an insulin drip. We will resume his Lantus once his  blood sugars have improved.  4. Anemia: His hemoglobin 9.7 seems similar to his recent discharge  hemoglobin. We will check serial hemoglobins. We will order a sample to the  blood bank. We will transfuse for hemoglobin less than 8.5.  5. Chronic kidney disease: Appears to have a component of acute on chronic  kidney disease. Will hydrate with IV fluids and monitor his volume status.  6. Peripheral vascular disease: The patient has known peripheral vascular  disease status post bypass. Pulses are auscultated using the echo probe  here in the emergency room.    Case was discussed with Dr. Satira Sark by telephone and Dr. Elmon Kirschner in person  here in the emergency room. Approximate time spent in critical care is 80  minutes.            Reviewed on 07/15/2009 7:20 PM        E-Signed By  Suzan Garibaldi, MD 08/06/2009 11:26    Suzan Garibaldi, MD    cc: Suzan Garibaldi, MD         TJK/WMX; D: 07/15/2009 6:01 P; T: 07/15/2009 6:56 P; DOC# [id number]; Job#  [id number]

## 2009-07-15 NOTE — Consults (Addendum)
Name: Travis [name], Travis [name] Admitted: 07/15/2009  MR #: [id number] DOB: Sep 04, 1940  Account #: [id number] Age: 69  Consultant: Shelda Altes, M.D. Location: [facility]     CONSULTATION REPORT    DATE OF CONSULTATION:      REQUESTING PHYSICIAN: ER physician.    INDICATION: Chest discomfort.    PATIENT PROBLEM LIST  1. Coronary artery disease.  a. Echocardiogram (May 31, 2009): EF equals 55%.  b. Cardiac catheterization (June 05, 2009): Nonobstructive disease of  LAD, chronically occluded left circumflex, 95% stenosis of mid right  coronary artery.  c. Status post percutaneous coronary intervention of mid right coronary  artery (June 05, 2009): 2.5 x 23 mm Xience stent.  d. Cardiac catheterization (June 22, 2009): LCX chronically occluded,  patent right coronary artery stent.  2. Peripheral vascular disease.  3. Insulin-requiring diabetes mellitus.  4. Dyslipidemia.  5. Chronic renal insufficiency.  6. Chronic obstructive pulmonary disease.    HISTORY OF PRESENT ILLNESS: The patient is a 69 year old man with the  above past medical history who presents with pain. He began having  left-sided anterior chest discomfort and left-sided abdominal pain today.  He notes that he has had 2 bowel movements as well with a very small amount  of bright red blood in it. He denies any shortness of breath. As you can  see in the above problem list, he has a history of coronary artery disease.  Interestingly, after his stent was placed he returned to the hospital  several weeks later with recurrent chest discomfort. At that time, his  troponin was 9, but his cardiac catheterization did not reveal any new  blockages or any obstructive disease other than his chronically occluded  left circumflex. He presents again today with minimally elevated troponin  of 2, and significant pain in his chest and abdomen.    PAST MEDICAL HISTORY: Please see above problem list.    MEDICATIONS  1. Aspirin 81 mg p.o. daily.   2. Imdur 60 mg p.o. daily.  3. Lantus insulin 42 units subcutaneously daily.  4. Synthroid 150 mcg p.o. daily.  5. Lisinopril 20 mg p.o. daily.  6. Metoprolol 25 mg p.o. b.i.d.  7. Hydralazine 100 mg p.o. t.i.d.  8. Misoprostol 200 mcg p.o. t.i.d.  9. Plavix 75 mg p.o. daily.  10. Norvasc 10 mg p.o. daily.  11. Zantac 150 mg p.o. daily.  12. Crestor 20 mg p.o. daily.  13. Bumex 2 mg p.o. daily.    ALLERGIES: PATIENT HAS NO KNOWN DRUG ALLERGIES.    SOCIAL HISTORY: He denies any tobacco, alcohol, or drug use.    FAMILY HISTORY: Noncontributory.    PERTINENT PHYSICAL EXAMINATION  VITAL SIGNS: Findings at time of consultation include T-max 98.1, heart  rate 102, blood pressure 142/63.  HEENT: Revealed no icterus. No oral lesions. No increased jugular venous  pressure. Carotids are 2+ bilaterally without bruits.  CHEST: Fairly clear to auscultation bilaterally without rales, rhonchi, or  rubs.  CARDIOVASCULAR: Revealed a short murmur that sounded to be a diastolic  murmur at his left scapular area. No gallops or rubs were noted.  ABDOMEN: Soft, with perhaps some minimal left-sided tenderness,  nondistended with decreased bowel sounds. There was no rebound.  EXTREMITIES: Revealed no cyanosis, clubbing, or edema.    PERTINENT LABORATORY VALUES: Potassium 4.8, BUN 62, creatinine 2.5.  Troponin 2.6. WBC count 10,800, hematocrit 29.2%    EKG shows normal sinus rhythm, rate of 98 beats per minute with left atrial  abnormality and lateral  nonspecific ST-T wave changes.    IMPRESSION/RECOMMENDATIONS: The patient is a 69 year old man with coronary  artery disease, peripheral vascular disease and diabetes who presents with  left-sided chest and abdominal discomfort since this morning and also a  small amount of bright red blood per rectum. He now has a troponin which is  2. This is down from 9 when he had a cardiac catheterization on January 27  which did not show any new disease. I doubt he has a thrombotic process of   his right coronary artery, but he potentially could have myocarditis or  perhaps this could be supply demand mismatch of his left circumflex chronic  occlusion, although his aforementioned dictated blood pressure notes a low  normal diastolic pressure, diastolic pressures have been fairly low thus  far on his current medications here in the emergency room. I would like to  obtain a transthoracic echocardiogram to assess his left ventricular  function as well to see if we can see parts of his aorta. Further  management will be based on this testing.      E-Signed By  Shelda Altes, M.D. 07/18/2009 09:46    Shelda Altes, M.D.    cc: Shelda Altes, M.D.        MX/WMX; D: 07/15/2009 4:48 P; T: 07/15/2009 7:09 P; DOC# [id number]; Job#  [id number]

## 2009-07-16 LAB — METABOLIC PANEL, BASIC
Anion gap: 19 mmol/L — ABNORMAL HIGH (ref 5–15)
Anion gap: 9 mmol/L (ref 5–15)
Anion gap: 7 mmol/L (ref 5–15)
BUN/Creatinine ratio: 24 — ABNORMAL HIGH (ref 12–20)
BUN/Creatinine ratio: 28 — ABNORMAL HIGH (ref 12–20)
BUN/Creatinine ratio: 26 — ABNORMAL HIGH (ref 12–20)
BUN: 63 MG/DL — ABNORMAL HIGH (ref 6–20)
BUN: 56 MG/DL — ABNORMAL HIGH (ref 6–20)
BUN: 50 MG/DL — ABNORMAL HIGH (ref 6–20)
CO2: 21 MMOL/L (ref 21–32)
CO2: 30 MMOL/L (ref 21–32)
CO2: 30 MMOL/L (ref 21–32)
Calcium: 9.6 MG/DL (ref 8.5–10.1)
Calcium: 9.8 MG/DL (ref 8.5–10.1)
Calcium: 8.8 MG/DL (ref 8.5–10.1)
Chloride: 96 MMOL/L — ABNORMAL LOW (ref 97–108)
Chloride: 102 MMOL/L (ref 97–108)
Chloride: 101 MMOL/L (ref 97–108)
Creatinine: 2.6 MG/DL — ABNORMAL HIGH (ref 0.6–1.3)
Creatinine: 2 MG/DL — ABNORMAL HIGH (ref 0.6–1.3)
Creatinine: 1.9 MG/DL — ABNORMAL HIGH (ref 0.6–1.3)
GFR est AA: 32 mL/min/{1.73_m2} — ABNORMAL LOW (ref >60)
GFR est AA: 43 mL/min/{1.73_m2} — ABNORMAL LOW (ref >60)
GFR est AA: 45 mL/min/{1.73_m2} — ABNORMAL LOW (ref >60)
GFR est non-AA: 26 mL/min/{1.73_m2} — ABNORMAL LOW (ref >60)
GFR est non-AA: 35 mL/min/{1.73_m2} — ABNORMAL LOW (ref >60)
GFR est non-AA: 38 mL/min/{1.73_m2} — ABNORMAL LOW (ref >60)
Glucose: 530 MG/DL — ABNORMAL HIGH (ref 65–100)
Glucose: 49 MG/DL — CL (ref 65–100)
Glucose: 127 MG/DL — ABNORMAL HIGH (ref 65–100)
Potassium: 4.1 MMOL/L (ref 3.5–5.1)
Potassium: 3.5 MMOL/L (ref 3.5–5.1)
Potassium: 4.2 MMOL/L (ref 3.5–5.1)
Sodium: 136 MMOL/L (ref 136–145)
Sodium: 141 MMOL/L (ref 136–145)
Sodium: 138 MMOL/L (ref 136–145)

## 2009-07-16 LAB — TROPONIN I
Troponin-I, Qt.: 19.55 ng/mL — ABNORMAL HIGH (ref <0.05)
Troponin-I, Qt.: 71.33 ng/mL — ABNORMAL HIGH (ref <0.05)
Troponin-I, Qt.: 62.66 ng/mL — ABNORMAL HIGH (ref <0.05)

## 2009-07-16 LAB — CBC WITH AUTOMATED DIFF
ABS. BASOPHILS: 0 10*3/uL (ref 0.0–0.1)
ABS. EOSINOPHILS: 0 10*3/uL (ref 0.0–0.4)
ABS. LYMPHOCYTES: 1.7 10*3/uL (ref 0.8–3.5)
ABS. MONOCYTES: 1.6 10*3/uL — ABNORMAL HIGH (ref 0.0–1.0)
ABS. NEUTROPHILS: 8.3 10*3/uL — ABNORMAL HIGH (ref 1.8–8.0)
BASOPHILS: 0 % (ref 0–1)
EOSINOPHILS: 0 % (ref 0–7)
HCT: 30.3 % — ABNORMAL LOW (ref 36.6–50.3)
HGB: 9.9 g/dL — ABNORMAL LOW (ref 12.1–17.0)
LYMPHOCYTES: 15 % (ref 12–49)
MCH: 28.7 PG (ref 26.0–34.0)
MCHC: 32.7 g/dL (ref 30.0–36.5)
MCV: 87.8 FL (ref 80.0–99.0)
MONOCYTES: 14 % — ABNORMAL HIGH (ref 5–13)
NEUTROPHILS: 71 % (ref 32–75)
PLATELET: 143 10*3/uL — ABNORMAL LOW (ref 150–400)
RBC: 3.45 M/uL — ABNORMAL LOW (ref 4.10–5.70)
RDW: 16.4 % — ABNORMAL HIGH (ref 11.5–14.5)
WBC: 11.6 10*3/uL — ABNORMAL HIGH (ref 4.1–11.1)

## 2009-07-16 LAB — LACTIC ACID: Lactic acid: 4.5 MMOL/L — ABNORMAL HIGH (ref 0.4–2.0)

## 2009-07-16 LAB — GLUCOSE, POC
Glucose (POC): 522 mg/dL — ABNORMAL HIGH (ref 75–110)
Glucose (POC): 431 mg/dL — ABNORMAL HIGH (ref 75–110)
Glucose (POC): 283 mg/dL — ABNORMAL HIGH (ref 75–110)
Glucose (POC): 320 mg/dL — ABNORMAL HIGH (ref 75–110)
Glucose (POC): 204 mg/dL — ABNORMAL HIGH (ref 75–110)
Glucose (POC): 126 mg/dL — ABNORMAL HIGH (ref 75–110)
Glucose (POC): 86 mg/dL (ref 75–110)
Glucose (POC): 101 mg/dL (ref 75–110)
Glucose (POC): 70 mg/dL — ABNORMAL LOW (ref 75–110)
Glucose (POC): 133 mg/dL — ABNORMAL HIGH (ref 75–110)
Glucose (POC): 110 mg/dL (ref 75–110)
Glucose (POC): 146 mg/dL — ABNORMAL HIGH (ref 75–110)

## 2009-07-16 LAB — HEPATIC FUNCTION PANEL
A-G Ratio: 0.7 — ABNORMAL LOW (ref 1.1–2.2)
A-G Ratio: 0.8 — ABNORMAL LOW (ref 1.1–2.2)
ALT (SGPT): 39 U/L (ref 12–78)
ALT (SGPT): 39 U/L (ref 12–78)
AST (SGOT): 263 U/L — ABNORMAL HIGH (ref 15–37)
AST (SGOT): 263 U/L — ABNORMAL HIGH (ref 15–37)
Albumin: 3.7 g/dL (ref 3.5–5.0)
Albumin: 3.7 g/dL (ref 3.5–5.0)
Alk. phosphatase: 98 U/L (ref 50–136)
Alk. phosphatase: 101 U/L (ref 50–136)
Bilirubin, direct: 0.1 MG/DL (ref 0.0–0.2)
Bilirubin, direct: 0.1 MG/DL (ref 0.0–0.2)
Bilirubin, total: 0.4 MG/DL (ref 0.2–1.0)
Bilirubin, total: 0.4 MG/DL (ref 0.2–1.0)
Globulin: 5 g/dL — ABNORMAL HIGH (ref 2.0–4.0)
Globulin: 4.9 g/dL — ABNORMAL HIGH (ref 2.0–4.0)
Protein, total: 8.7 g/dL — ABNORMAL HIGH (ref 6.4–8.2)
Protein, total: 8.6 g/dL — ABNORMAL HIGH (ref 6.4–8.2)

## 2009-07-16 LAB — LIPASE: Lipase: 96 U/L (ref 73–393)

## 2009-07-16 LAB — CK W/ CKMB & INDEX
CK - MB: 88.5 NG/ML — ABNORMAL HIGH (ref 0.5–3.6)
CK - MB: 131.8 NG/ML — ABNORMAL HIGH (ref 0.5–3.6)
CK-MB Index: 11.3 — ABNORMAL HIGH (ref 0–2.5)
CK-MB Index: 8.1 — ABNORMAL HIGH (ref 0–2.5)
CK: 786 U/L — ABNORMAL HIGH (ref 35–232)
CK: 1619 U/L — ABNORMAL HIGH (ref 35–232)

## 2009-07-16 LAB — PTT: aPTT: 29.6 s (ref 24.0–33.0)

## 2009-07-16 LAB — PROTHROMBIN TIME + INR
INR: 1.3 — ABNORMAL HIGH (ref 0.9–1.1)
Prothrombin time: 13.4 SECS — ABNORMAL HIGH (ref 9.0–11.0)

## 2009-07-16 LAB — HGB & HCT
HCT: 29.9 % — ABNORMAL LOW (ref 36.6–50.3)
HCT: 29.5 % — ABNORMAL LOW (ref 36.6–50.3)
HGB: 9.8 g/dL — ABNORMAL LOW (ref 12.1–17.0)
HGB: 9.8 g/dL — ABNORMAL LOW (ref 12.1–17.0)

## 2009-07-16 NOTE — Consults (Addendum)
Name: Travis [name], GANAWAY Admitted: 07/15/2009  MR #: [id number] DOB: Feb 15, 1941  Account #: [id number] Age: 69  Consultant: Lenox Ponds. Westley Chandler, M.D. Location: [facility]     CONSULTATION REPORT    DATE OF CONSULTATION: 07/16/2009      REASON FOR CONSULTATION: Acute and chronic kidney disease.    HISTORY OF PRESENT ILLNESS: The patient is a 69 year old African American  male followed by my partner, Dr. Laureen Ochs, with the following past  medical history: Obstructive nephropathy, hypothyroidism, edema, history of  tobacco use, carotid stenosis, chronic kidney disease, stage 3, with a  baseline creatinine around 2.0 mg/dL, COPD, peripheral vascular disease,  diabetic retinopathy, diabetes mellitus, obstructive sleep apnea, anemia,  and hypertension.    The patient was recently seen in my office on approximately July 11, 2009. He was seen at that time and indicated that he did not want dialysis  if/when indicated. The review of that note suggested he had been  hospitalized recently with a creatinine which had peaked at 3.2, fell to  1.9 mg/dL. He was admitted yesterday after presenting with complaints of  bloody bowel movements, as well as abdominal and chest pain. His creatinine  at presentation was 2.6, now improved to 2.0 mg/dL (baseline). He currently  still complains of some slight chest pain, but says this is much better. He  has no complaints of shortness of breath.    PHYSICAL EXAMINATION  GENERAL: An elderly appearing African American male in the intensive care  unit.  VITAL SIGNS: Blood pressure 163/61, intake and output are 587/750.  HEENT: Head is normocephalic and atraumatic. Eyes show extraocular moves  to be intact.  NECK: Supple.  LUNGS: Clear to auscultation.  CARDIOVASCULAR: Exam shows a rhythm which appears to be regular.  ABDOMEN: Obese and soft.  EXTREMITIES: No pretibial edema.  NEUROLOGIC: He is awake, alert, seems to answer questions appropriately.     LABORATORY: Sodium 141, potassium 3.5, chloride and bicarb are 102 and 30  with a BUN and creatinine of 56 and 2.0, calcium 9.8. White blood cell  count 12,000, hematocrit 30%, platelet count 143,000.    ASSESSMENT AND PLAN  1. Acute kidney injury, resolved.  2. Chronic kidney disease, creatinine back to baseline. He has indicated  previously in my office that he does not want renal replacement therapy if  he should deteriorate.  3. Anemia.  4. Probable myocardial infarction.  5. Coronary artery disease.  6. From a renal standpoint he seems to be back to baseline. We will follow  up tomorrow.    Thank you for this consultation.          E-Signed By  Lenox Ponds Westley Chandler, M.D. 07/17/2009 15:31    Lenox Ponds. Westley Chandler, M.D.    cc: Lenox Ponds. Westley Chandler, M.D.   Laureen Ochs, M.D.    HOSPITALIST SERVICE    CGA/WMX; D: 07/16/2009 7:17 A; T: 07/16/2009 7:32 A; DOC# [id number]; Job#  [id number]

## 2009-07-16 NOTE — Consults (Addendum)
Name: Travis [name], Travis [name] Admitted: 07/15/2009  MR #: [id number] DOB: 1940/06/04  Account #: [id number] Age: 69  Consultant: Patrecia Pour, MD Location: [facility]     CONSULTATION REPORT    DATE OF CONSULTATION: 07/15/2009      REQUESTING PHYSICIAN: Dr. Selena Batten. Thank you for the consult.    REASON FOR CONSULTATION: Hematochezia, elevated BUN, and systolic  hypotension.    HISTORY: This is a 69 year old year-old African American male who presents  to the emergency room with 3 episodes of hematochezia. He states it was  bright red on the first episode and darker red on the last episode. He was  having upper abdominal/chest pain as well. He denies any previous GI  bleeding, no diarrhea, no change of bowel habits, no weight loss. He has  had some nausea and vomiting with the chest pain/abdominal pain, but no  hematemesis. There have been no coffee-ground emesis. He denies any  melena.    PAST MEDICAL HISTORY: His past GI history is notable for a colonoscopy at  the [facility] via the [facility] approximately 3 years ago.  Apparently, a polyp was seen. At that time, he states he has never had an  upper endoscopy. He does take a baby aspirin, but denies taking Motrin,  Advil, or other nonsteroidals on a frequent basis.    PAST MEDICAL HISTORY: Notable for coronary artery disease, renal  insufficiency, peripheral vascular disease, COPD, hyperlipidemia, and  diabetes.    MEDICATIONS: Include  1. Baby aspirin.  2. Imdur.  3. Insulin.  4. Levothyroxine.  5. Lisinopril.  6. Hydralazine.  7. Misoprostol.  8. Zantac.  9. Crestor.  10. Bumex.  11. Plavix.    SOCIAL HISTORY: Past tobacco use. Denies alcohol abuse.    REVIEW OF SYSTEMS: Eleven-point review is performed and no new pertinent  positives of negatives.    PHYSICAL EXAMINATION  VITAL SIGNS: His blood pressure is now 181/54, pulse is 102. In the  emergency room he was 150/39.  HEENT: Anicteric. Conjunctivae pink.  NECK: Supple.  LUNGS: Clear.   HEART: Regular.  ABDOMEN: Bowel sounds are present. Soft, protuberant, but essentially  nontender. No organomegaly. No rebound. No organomegaly. No masses.  EXTREMITIES: Only trace edema.  NEUROLOGIC: Alert and oriented x3.  SKIN: Without rashes or purpura.    LABORATORY WORK: Hemoglobin 9.7 on arrival, now 9.8 on subsequent check.  His BUN is elevated at 63. His creatinine is 2.6. His glucose was high at  618, and is still 400 on Accu-Cheks. Presently, his bicarb was 21. His  potassium was 4.1. His troponin has been slowly increasing. His AST was 49,  ALT is 28, and total bilirubin is normal.    IMPRESSION: Hematochezia x3 of unknown amounts, however. He had an early  depressed systolic blood pressure, but now better and a markedly elevated  BUN, but this may be some mild dehydration due to his elevated glucose  rather than true gastrointestinal bleeding since his hemoglobin has been  stable over 6 hours with hydration. Cardiology has followed the slight  elevation of troponin. Would continue Protonix q.12 hours. He can have  unclear liquids. Follow hemoglobin. May need esophagogastroduodenoscopy in  the near future.        Reviewed on 07/16/2009 10:11 AM      Electronically Signed By  Arman Filter, MD 07/18/2009 16:20    Patrecia Pour, MD    cc: Patrecia Pour, MD   Suzan Garibaldi, MD  HME/WMX; D: 07/15/2009 11:13 P; T: 07/16/2009 2:35 A; DOC# [id number]; Job#  [id number]

## 2009-07-17 LAB — METABOLIC PANEL, COMPREHENSIVE
A-G Ratio: 0.6 — ABNORMAL LOW (ref 1.1–2.2)
A-G Ratio: 0.8 — ABNORMAL LOW (ref 1.1–2.2)
ALT (SGPT): 34 U/L (ref 12–78)
ALT (SGPT): 39 U/L (ref 12–78)
AST (SGOT): 145 U/L — ABNORMAL HIGH (ref 15–37)
AST (SGOT): 205 U/L — ABNORMAL HIGH (ref 15–37)
Albumin: 2.9 g/dL — ABNORMAL LOW (ref 3.5–5.0)
Albumin: 3.2 g/dL — ABNORMAL LOW (ref 3.5–5.0)
Alk. phosphatase: 90 U/L (ref 50–136)
Alk. phosphatase: 97 U/L (ref 50–136)
Anion gap: 16 mmol/L — ABNORMAL HIGH (ref 5–15)
Anion gap: 5 mmol/L (ref 5–15)
BUN/Creatinine ratio: 23 — ABNORMAL HIGH (ref 12–20)
BUN/Creatinine ratio: 24 — ABNORMAL HIGH (ref 12–20)
BUN: 53 MG/DL — ABNORMAL HIGH (ref 6–20)
BUN: 53 MG/DL — ABNORMAL HIGH (ref 6–20)
Bilirubin, total: 0.4 MG/DL (ref 0.2–1.0)
Bilirubin, total: 0.6 MG/DL (ref 0.2–1.0)
CO2: 22 MMOL/L (ref 21–32)
CO2: 29 MMOL/L (ref 21–32)
Calcium: 8.3 MG/DL — ABNORMAL LOW (ref 8.5–10.1)
Calcium: 8.6 MG/DL (ref 8.5–10.1)
Chloride: 94 MMOL/L — ABNORMAL LOW (ref 97–108)
Chloride: 98 MMOL/L (ref 97–108)
Creatinine: 2.2 MG/DL — ABNORMAL HIGH (ref 0.6–1.3)
Creatinine: 2.3 MG/DL — ABNORMAL HIGH (ref 0.6–1.3)
GFR est AA: 36 mL/min/{1.73_m2} — ABNORMAL LOW (ref >60)
GFR est AA: 38 mL/min/{1.73_m2} — ABNORMAL LOW (ref >60)
GFR est non-AA: 30 mL/min/{1.73_m2} — ABNORMAL LOW (ref >60)
GFR est non-AA: 32 mL/min/{1.73_m2} — ABNORMAL LOW (ref >60)
Globulin: 4.1 g/dL — ABNORMAL HIGH (ref 2.0–4.0)
Globulin: 4.6 g/dL — ABNORMAL HIGH (ref 2.0–4.0)
Glucose: 225 MG/DL — ABNORMAL HIGH (ref 65–100)
Glucose: 563 MG/DL — ABNORMAL HIGH (ref 65–100)
Potassium: 4.4 MMOL/L (ref 3.5–5.1)
Potassium: 4.9 MMOL/L (ref 3.5–5.1)
Protein, total: 7.3 g/dL (ref 6.4–8.2)
Protein, total: 7.5 g/dL (ref 6.4–8.2)
Sodium: 132 MMOL/L — ABNORMAL LOW (ref 136–145)
Sodium: 132 MMOL/L — ABNORMAL LOW (ref 136–145)

## 2009-07-17 LAB — METABOLIC PANEL, BASIC
Anion gap: 12 mmol/L (ref 5–15)
Anion gap: 8 mmol/L (ref 5–15)
BUN/Creatinine ratio: 23 — ABNORMAL HIGH (ref 12–20)
BUN/Creatinine ratio: 24 — ABNORMAL HIGH (ref 12–20)
BUN: 56 MG/DL — ABNORMAL HIGH (ref 6–20)
BUN: 55 MG/DL — ABNORMAL HIGH (ref 6–20)
CO2: 24 MMOL/L (ref 21–32)
CO2: 26 MMOL/L (ref 21–32)
Calcium: 8.5 MG/DL (ref 8.5–10.1)
Calcium: 8.5 MG/DL (ref 8.5–10.1)
Chloride: 93 MMOL/L — ABNORMAL LOW (ref 97–108)
Chloride: 95 MMOL/L — ABNORMAL LOW (ref 97–108)
Creatinine: 2.4 MG/DL — ABNORMAL HIGH (ref 0.6–1.3)
Creatinine: 2.3 MG/DL — ABNORMAL HIGH (ref 0.6–1.3)
GFR est AA: 35 mL/min/{1.73_m2} — ABNORMAL LOW (ref >60)
GFR est AA: 36 mL/min/{1.73_m2} — ABNORMAL LOW (ref >60)
GFR est non-AA: 29 mL/min/{1.73_m2} — ABNORMAL LOW (ref >60)
GFR est non-AA: 30 mL/min/{1.73_m2} — ABNORMAL LOW (ref >60)
Glucose: 630 MG/DL — CR (ref 65–100)
Glucose: 540 MG/DL — ABNORMAL HIGH (ref 65–100)
Potassium: 4.5 MMOL/L (ref 3.5–5.1)
Potassium: 4.8 MMOL/L (ref 3.5–5.1)
Sodium: 129 MMOL/L — ABNORMAL LOW (ref 136–145)
Sodium: 129 MMOL/L — ABNORMAL LOW (ref 136–145)

## 2009-07-17 LAB — CBC WITH AUTOMATED DIFF
ABS. BASOPHILS: 0 10*3/uL (ref 0.0–0.1)
ABS. EOSINOPHILS: 0 10*3/uL (ref 0.0–0.4)
ABS. LYMPHOCYTES: 0.8 10*3/uL (ref 0.8–3.5)
ABS. MONOCYTES: 0.7 10*3/uL (ref 0.0–1.0)
ABS. NEUTROPHILS: 8.8 10*3/uL — ABNORMAL HIGH (ref 1.8–8.0)
BASOPHILS: 0 % (ref 0–1)
EOSINOPHILS: 0 % (ref 0–7)
HCT: 27.8 % — ABNORMAL LOW (ref 36.6–50.3)
HGB: 9.1 g/dL — ABNORMAL LOW (ref 12.1–17.0)
LYMPHOCYTES: 8 % — ABNORMAL LOW (ref 12–49)
MCH: 29 PG (ref 26.0–34.0)
MCHC: 32.7 g/dL (ref 30.0–36.5)
MCV: 88.5 FL (ref 80.0–99.0)
MONOCYTES: 7 % (ref 5–13)
NEUTROPHILS: 85 % — ABNORMAL HIGH (ref 32–75)
PLATELET: 124 10*3/uL — ABNORMAL LOW (ref 150–400)
RBC: 3.14 M/uL — ABNORMAL LOW (ref 4.10–5.70)
RDW: 16.3 % — ABNORMAL HIGH (ref 11.5–14.5)
WBC: 10.3 10*3/uL (ref 4.1–11.1)

## 2009-07-17 LAB — GLUCOSE, POC
Glucose (POC): 494 mg/dL — ABNORMAL HIGH (ref 75–110)
Glucose (POC): 368 mg/dL — ABNORMAL HIGH (ref 75–110)
Glucose (POC): 266 mg/dL — ABNORMAL HIGH (ref 75–110)
Glucose (POC): 101 mg/dL (ref 75–110)
Glucose (POC): 201 mg/dL — ABNORMAL HIGH (ref 75–110)
Glucose (POC): 165 mg/dL — ABNORMAL HIGH (ref 75–110)
Glucose (POC): 145 mg/dL — ABNORMAL HIGH (ref 75–110)
Glucose (POC): 143 mg/dL — ABNORMAL HIGH (ref 75–110)
Glucose (POC): >600 mg/dL — CR (ref 75–110)
Glucose (POC): >600 mg/dL — CR (ref 75–110)
Glucose (POC): >600 mg/dL — CR (ref 75–110)
Glucose (POC): >600 mg/dL — CR (ref 75–110)
Glucose (POC): >600 mg/dL — CR (ref 75–110)
Glucose (POC): >600 mg/dL — CR (ref 75–110)
Glucose (POC): >600 mg/dL — CR (ref 75–110)
Glucose (POC): 186 mg/dL — ABNORMAL HIGH (ref 75–110)
Glucose (POC): 256 mg/dL — ABNORMAL HIGH (ref 75–110)
Glucose (POC): 251 mg/dL — ABNORMAL HIGH (ref 75–110)

## 2009-07-17 LAB — CULTURE, MRSA

## 2009-07-17 LAB — HGB & HCT
HCT: 28.3 % — ABNORMAL LOW (ref 36.6–50.3)
HGB: 9.3 g/dL — ABNORMAL LOW (ref 12.1–17.0)

## 2009-07-18 LAB — METABOLIC PANEL, BASIC
Anion gap: 6 mmol/L (ref 5–15)
BUN/Creatinine ratio: 24 — ABNORMAL HIGH (ref 12–20)
BUN: 41 MG/DL — ABNORMAL HIGH (ref 6–20)
CO2: 31 MMOL/L (ref 21–32)
Calcium: 8.3 MG/DL — ABNORMAL LOW (ref 8.5–10.1)
Chloride: 98 MMOL/L (ref 97–108)
Creatinine: 1.7 MG/DL — ABNORMAL HIGH (ref 0.6–1.3)
GFR est AA: 52 mL/min/{1.73_m2} — ABNORMAL LOW (ref >60)
GFR est non-AA: 43 mL/min/{1.73_m2} — ABNORMAL LOW (ref >60)
Glucose: 160 MG/DL — ABNORMAL HIGH (ref 65–100)
Potassium: 3.4 MMOL/L — ABNORMAL LOW (ref 3.5–5.1)
Sodium: 135 MMOL/L — ABNORMAL LOW (ref 136–145)

## 2009-07-18 LAB — CBC WITH AUTOMATED DIFF
ABS. BASOPHILS: 0 10*3/uL (ref 0.0–0.1)
ABS. EOSINOPHILS: 0.3 10*3/uL (ref 0.0–0.4)
ABS. LYMPHOCYTES: 1.7 10*3/uL (ref 0.8–3.5)
ABS. MONOCYTES: 0.7 10*3/uL (ref 0.0–1.0)
ABS. NEUTROPHILS: 4.5 10*3/uL (ref 1.8–8.0)
BASOPHILS: 0 % (ref 0–1)
EOSINOPHILS: 4 % (ref 0–7)
HCT: 27.6 % — ABNORMAL LOW (ref 36.6–50.3)
HGB: 9.1 g/dL — ABNORMAL LOW (ref 12.1–17.0)
LYMPHOCYTES: 24 % (ref 12–49)
MCH: 28.9 PG (ref 26.0–34.0)
MCHC: 33 g/dL (ref 30.0–36.5)
MCV: 87.6 FL (ref 80.0–99.0)
MONOCYTES: 10 % (ref 5–13)
NEUTROPHILS: 62 % (ref 32–75)
PLATELET: 111 10*3/uL — ABNORMAL LOW (ref 150–400)
RBC: 3.15 M/uL — ABNORMAL LOW (ref 4.10–5.70)
RDW: 16.1 % — ABNORMAL HIGH (ref 11.5–14.5)
WBC: 7.2 10*3/uL (ref 4.1–11.1)

## 2009-07-18 LAB — GLUCOSE, POC
Glucose (POC): 380 mg/dL — ABNORMAL HIGH (ref 75–110)
Glucose (POC): 203 mg/dL — ABNORMAL HIGH (ref 75–110)
Glucose (POC): 191 mg/dL — ABNORMAL HIGH (ref 75–110)
Glucose (POC): 140 mg/dL — ABNORMAL HIGH (ref 75–110)
Glucose (POC): 138 mg/dL — ABNORMAL HIGH (ref 75–110)
Glucose (POC): 133 mg/dL — ABNORMAL HIGH (ref 75–110)

## 2009-07-18 LAB — IRON PROFILE
Iron % saturation: 31 % (ref 20–50)
Iron: 56 ug/dL (ref 35–150)
TIBC: 181 ug/dL — ABNORMAL LOW (ref 250–450)

## 2009-07-18 LAB — FERRITIN: Ferritin: 556 NG/ML — ABNORMAL HIGH (ref 26–388)

## 2009-07-18 NOTE — Telephone Encounter (Signed)
Have faxed records.

## 2009-07-18 NOTE — Telephone Encounter (Signed)
Message copied by Einar Gip on Tue Jul 18, 2009  2:05 PM  ------       Message from: Laddie Aquas       Created: Fri Jul 14, 2009  9:21 AM       Regarding: cardiac          Please refer for outpt cardiac rehab in [place] please.       Thx,       sk

## 2009-07-19 LAB — METABOLIC PANEL, BASIC
Anion gap: 5 mmol/L (ref 5–15)
BUN/Creatinine ratio: 19 (ref 12–20)
BUN: 25 MG/DL — ABNORMAL HIGH (ref 6–20)
CO2: 28 MMOL/L (ref 21–32)
Calcium: 8.1 MG/DL — ABNORMAL LOW (ref 8.5–10.1)
Chloride: 101 MMOL/L (ref 97–108)
Creatinine: 1.3 MG/DL (ref 0.6–1.3)
GFR est AA: >60 mL/min/{1.73_m2} (ref >60)
GFR est non-AA: 58 mL/min/{1.73_m2} — ABNORMAL LOW (ref >60)
Glucose: 287 MG/DL — ABNORMAL HIGH (ref 65–100)
Potassium: 4 MMOL/L (ref 3.5–5.1)
Sodium: 134 MMOL/L — ABNORMAL LOW (ref 136–145)

## 2009-07-19 LAB — CBC WITH AUTOMATED DIFF
ABS. BASOPHILS: 0 10*3/uL (ref 0.0–0.1)
ABS. EOSINOPHILS: 0.2 10*3/uL (ref 0.0–0.4)
ABS. LYMPHOCYTES: 1.3 10*3/uL (ref 0.8–3.5)
ABS. MONOCYTES: 0.6 10*3/uL (ref 0.0–1.0)
ABS. NEUTROPHILS: 3.7 10*3/uL (ref 1.8–8.0)
BASOPHILS: 0 % (ref 0–1)
EOSINOPHILS: 3 % (ref 0–7)
HCT: 27.9 % — ABNORMAL LOW (ref 36.6–50.3)
HGB: 9.2 g/dL — ABNORMAL LOW (ref 12.1–17.0)
LYMPHOCYTES: 23 % (ref 12–49)
MCH: 28.7 PG (ref 26.0–34.0)
MCHC: 33 g/dL (ref 30.0–36.5)
MCV: 86.9 FL (ref 80.0–99.0)
MONOCYTES: 10 % (ref 5–13)
NEUTROPHILS: 64 % (ref 32–75)
PLATELET: 109 10*3/uL — ABNORMAL LOW (ref 150–400)
RBC: 3.21 M/uL — ABNORMAL LOW (ref 4.10–5.70)
RDW: 15.9 % — ABNORMAL HIGH (ref 11.5–14.5)
WBC: 5.8 10*3/uL (ref 4.1–11.1)

## 2009-07-19 LAB — GLUCOSE, POC
Glucose (POC): 282 mg/dL — ABNORMAL HIGH (ref 75–110)
Glucose (POC): 266 mg/dL — ABNORMAL HIGH (ref 75–110)
Glucose (POC): 262 mg/dL — ABNORMAL HIGH (ref 75–110)
Glucose (POC): 74 mg/dL — ABNORMAL LOW (ref 75–110)
Glucose (POC): 85 mg/dL (ref 75–110)

## 2009-07-19 NOTE — Consults (Addendum)
Name: Travis [name], COATE Admitted: 07/15/2009  MR #: [id number] DOB: 1940-11-30  Account #: [id number] Age: 69  Consultant: Rolanda Lundborg. Everardo Beals, M.D. Location: [facility]     CONSULTATION REPORT    DATE OF CONSULTATION: 07/19/2009      REASON FOR CONSULTATION: Bradycardia.    HISTORY OF PRESENT ILLNESS: A 69 year old man with coronary artery disease  s/p PCI and stenting of the RCA in January of this year with known  occlusion of left circumflex and noncritical disease of the LAD by  subsequent catheterization yesterday demonstrating preserved LV function.  The patient has been noted to have evidence of second-degree AV block with  periods of 2:1 conduction with effective heart rates of 45 beats per  minute. The patient was admitted in the setting of GI bleed of undetermined  origin and found to have non-ST MI. Cardiac cath followed, demonstrating  patency of the patient's previous RCA stent site.    While on the monitor, patient has been noted to have episodes of Mobitz 1  AV block as well as 2:1 block.    ALLERGIES: NONE.    MEDICATIONS  1. Norvasc 10 mg daily.  2. Aspirin 81 daily.  3. Bumex 2 mg daily.  4. Plavix 75 daily.  5. Lisinopril 10 daily.  6. Advair Diskus 250/50.  7. Levoxyl 0.15 mg daily.  8. Metoprolol tartrate 50 b.i.d.  9. Isordil extended release 60 mg daily.  10. Insulin.    SOCIAL HISTORY: Remarkable for former tobacco use. The patient does not  consume alcohol. There is no past history of heavy alcohol use.    PAST MEDICAL HISTORY: Does include hypertension, hyperlipidemia, diabetes  mellitus, peripheral vascular disease, a right fem-pop surgery  approximately 2006. The patient suffers from chronic obstructive pulmonary  disease as well as obstructive sleep apnea. There is a history of  hypothyroidism and chronic renal insufficiency.    PHYSICAL EXAMINATION  VITAL SIGNS: Blood pressure is 142/42 with a heart rate of 93 beats per  minute and regular.   NECK: Reveals no evidence of carotid bruits. There is no thyroid  enlargement or nodularity. There is minimal jugular venous distention.  CHEST: Clear to auscultation and percussion.  CARDIOVASCULAR: Reveals PMI at the left midclavicular line with regular  heart rate and soft systolic ejection murmur. There is no gallop at this  time.  ABDOMINAL: Soft, nontender.  EXTREMITIES: Reveal markedly dry skin, questionable psoriasis with  decreased pedal pulses. There is no edema. Well-healed surgical scar over  the right leg is noted.    DIAGNOSTIC STUDIES: ECG demonstrates sinus rhythm with first-degree AV  block with PR interval to 302 msec, QRS duration 104 msec. There is diffuse  nonspecific ST abnormality.    IMPRESSION: Coronary artery disease, atrioventricular conduction disease.  The patient's second-degree atrioventricular block in the setting of narrow  QRS likely represents atrioventricular node disease rather than distal  disease. I am not as concerned about the 2:1 block which occurs during  sleeping as I believe this could most likely be successfully treated with  CPAP but the patient has Mobitz 1 second-degree AV block while awake  precludes the use of higher doses of beta blockers or a drug such as  Cardizem. If increased beta blockade is needed, I suspect the patient will  require a pacemaker. I have discussed this with the patient, and we will  defer decision regarding the choice of antianginal medication to Dr. Welton Flakes  and I have offered my services regarding pacemaker  placement to the patient  in the event that increased beta blockade is required.        E-Signed By  Rolanda Lundborg Everardo Beals, M.D. 07/20/2009 13:56    Rolanda Lundborg. Everardo Beals, M.D.    cc: Rolanda Lundborg. Everardo Beals, M.D.        ACC/WMX; D: 07/19/2009 4:13 P; T: 07/19/2009 4:48 P; DOC# [id number]; Job#  [id number]

## 2009-07-20 LAB — METABOLIC PANEL, BASIC
Anion gap: 4 mmol/L — ABNORMAL LOW (ref 5–15)
BUN/Creatinine ratio: 15 (ref 12–20)
BUN: 21 MG/DL — ABNORMAL HIGH (ref 6–20)
CO2: 30 MMOL/L (ref 21–32)
Calcium: 8.3 MG/DL — ABNORMAL LOW (ref 8.5–10.1)
Chloride: 100 MMOL/L (ref 97–108)
Creatinine: 1.4 MG/DL — ABNORMAL HIGH (ref 0.6–1.3)
GFR est AA: >60 mL/min/{1.73_m2} (ref >60)
GFR est non-AA: 53 mL/min/{1.73_m2} — ABNORMAL LOW (ref >60)
Glucose: 174 MG/DL — ABNORMAL HIGH (ref 65–100)
Potassium: 3.6 MMOL/L (ref 3.5–5.1)
Sodium: 134 MMOL/L — ABNORMAL LOW (ref 136–145)

## 2009-07-20 LAB — CBC WITH AUTOMATED DIFF
ABS. BASOPHILS: 0 10*3/uL (ref 0.0–0.1)
ABS. EOSINOPHILS: 0.2 10*3/uL (ref 0.0–0.4)
ABS. LYMPHOCYTES: 1.5 10*3/uL (ref 0.8–3.5)
ABS. MONOCYTES: 0.6 10*3/uL (ref 0.0–1.0)
ABS. NEUTROPHILS: 3.3 10*3/uL (ref 1.8–8.0)
BASOPHILS: 0 % (ref 0–1)
EOSINOPHILS: 3 % (ref 0–7)
HCT: 27.9 % — ABNORMAL LOW (ref 36.6–50.3)
HGB: 9.2 g/dL — ABNORMAL LOW (ref 12.1–17.0)
LYMPHOCYTES: 27 % (ref 12–49)
MCH: 28.8 PG (ref 26.0–34.0)
MCHC: 33 g/dL (ref 30.0–36.5)
MCV: 87.2 FL (ref 80.0–99.0)
MONOCYTES: 11 % (ref 5–13)
NEUTROPHILS: 59 % (ref 32–75)
PLATELET: 114 10*3/uL — ABNORMAL LOW (ref 150–400)
RBC: 3.2 M/uL — ABNORMAL LOW (ref 4.10–5.70)
RDW: 15.7 % — ABNORMAL HIGH (ref 11.5–14.5)
WBC: 5.6 10*3/uL (ref 4.1–11.1)

## 2009-07-20 LAB — GLUCOSE, POC
Glucose (POC): 171 mg/dL — ABNORMAL HIGH (ref 75–110)
Glucose (POC): 179 mg/dL — ABNORMAL HIGH (ref 75–110)
Glucose (POC): 310 mg/dL — ABNORMAL HIGH (ref 75–110)
Glucose (POC): 263 mg/dL — ABNORMAL HIGH (ref 75–110)

## 2009-07-21 LAB — METABOLIC PANEL, BASIC
Anion gap: 9 mmol/L (ref 5–15)
BUN/Creatinine ratio: 15 (ref 12–20)
BUN: 26 MG/DL — ABNORMAL HIGH (ref 6–20)
CO2: 29 MMOL/L (ref 21–32)
Calcium: 8.2 MG/DL — ABNORMAL LOW (ref 8.5–10.1)
Chloride: 96 MMOL/L — ABNORMAL LOW (ref 97–108)
Creatinine: 1.7 MG/DL — ABNORMAL HIGH (ref 0.6–1.3)
GFR est AA: 52 mL/min/{1.73_m2} — ABNORMAL LOW (ref >60)
GFR est non-AA: 43 mL/min/{1.73_m2} — ABNORMAL LOW (ref >60)
Glucose: 200 MG/DL — ABNORMAL HIGH (ref 65–100)
Potassium: 3.7 MMOL/L (ref 3.5–5.1)
Sodium: 134 MMOL/L — ABNORMAL LOW (ref 136–145)

## 2009-07-21 LAB — GLUCOSE, POC: Glucose (POC): 110 mg/dL (ref 75–110)

## 2009-07-21 MED ORDER — MISOPROSTOL 200 MCG TAB
200 mcg | ORAL_TABLET | Freq: Two times a day (BID) | ORAL | Status: DC
Start: 2009-07-21 — End: 2009-07-24

## 2009-07-21 NOTE — Procedures (Addendum)
Name: Travis [name], Travis [name] By:  MR #: [id number] DOB: 03-22-1941  Account #: [id number] Room: [facility]     PERMANENT PACEMAKER INSERTION / AICD    DATE OF PROCEDURE: 07/20/2009    INDICATION: Second-degree AV block.    HISTORY OF PRESENT ILLNESS: A 69 year old male with known coronary disease  and evidence of 2:1 AV block as well as periods of complete heart block,  now for permanent pacemaker placement to allow for utilization of  beta-blockade in this man with significant coronary disease, not amenable  to revascularization.    METHOD: After obtaining written informed consent, the patient was brought  to the electrophysiology laboratory. Electrocardiographic monitoring was  established and IV patency confirmed. The area overlying the right  infraclavicular space was prepped and draped in a sterile fashion, 2%  lidocaine solution was used for local anesthesia, and cutdown performed to  the area of the cephalic vein. The cephalic vein was isolated with 2-0 silk  suture, cannulated with an 18-gauge angiocath, and guidewire advanced into  the central venous space. At this time, a 9-French sheath was advanced over  the guidewire and subsequently a [employer] Jude lead, model number 2088, serial  number [id number], was advanced to the right ventricular septum. The active  fixation device was deployed. R-waves and thresholds were found to be  satisfactory, and the lead secured proximally with 0 silk suture.    Using the retained guidewire, a 7-French sheath was placed and subsequently  St. Jude lead, model number 1488TC, serial number [id number], was advanced to  the right atrium. The active fixation device was deployed. P-waves and  thresholds were found to be satisfactory, and the lead secured proximally  with 0 silk suture.    The wound was now vigorously irrigated with vancomycin irrigation solution,  and pulse generator by [employer] Jude, model number 5826, serial number [id number],   was connected to the newly implanted leads. Pacemaker function was  confirmed and the wound closed using 3-0 chromic gut suture and 4-0 Dexon  suture. Steri-Strips were used to complete the dressing.    THRESHOLD IMPLANTATION DATA: P-wave amplitude 3.0 mV, with an atrial  threshold of 0.6 V at a pulse width of 0.4 msec, with an atrial lead  impedance of 422 ohms. R-wave amplitude is 8 mV with a ventricular  threshold of 0.5 V at a pulse width of 0.4 msec, with ventricular lead  impedance of 538 ohms.    CONCLUSION: Uneventful dual-chamber permanent pacemaker placement  utilizing St. Jude generator system, model number 5826.        Reviewed on 07/21/2009 11:26 AM      E-Signed By  Rolanda Lundborg. Everardo Beals, M.D. 07/21/2009 11:50  Rolanda Lundborg. Everardo Beals, M.D.    cc: Rolanda Lundborg. Everardo Beals, M.D.   Tharon Aquas, MD   Suzan Garibaldi, MD        ACC/WMX; D: 07/20/2009 3:05 P; T: 07/21/2009 11:17 A; DOC# [id number]; Job#  [id number]

## 2009-07-22 NOTE — Discharge Summary (Addendum)
Name: Travis [name], Travis [name] Admitted: 07/15/2009  MR #: [id number] Discharged: 07/21/2009  Account #: [id number] DOB: [birth year]  Physician: Wendie Agreste, MD Age 69     DISCHARGE SUMMARY      PRIMARY CARE PHYSICIAN: [facility].    CARDIOLOGIST: Dr. Welton Flakes    DISCHARGE DISPOSITION: Home.    DISCHARGE DIAGNOSES  1. Acute non-ST-elevation myocardial infarction, status post cardiac  catheterization.  2. Second-degree AV block, status post pacemaker placement on July 20, 2009.  3. Gastrointestinal bleed.  4. Chronic anemia.    SECONDARY DISCHARGE DIAGNOSES  1. Type 2 diabetes mellitus.  2. Chronic kidney disease.  3. Chronic obstructive pulmonary disease.  4. Obstructive sleep apnea.  5. Hyperlipidemia.    DISCHARGE MEDICATIONS  1. Metoprolol, dose increased to 100 mg twice daily.  2. Percocet p.r.n.  3. Keflex, prescription given by Dr. Welton Flakes.  4. Ferrous sulfate 325 mg over-the-counter once daily.  5. Lantus, dose increased to 46 units daily.  6. Rest of medications as prior to admission, except hydralazine and  clonidine to be discontinued.  7. Lisinopril 20 mg daily.  8. Isosorbide 60 mg daily.  9. Terazosin 10 mg daily.  10. Aspirin 81 mg daily.  11. Plavix 75 mg daily.  12. Amlodipine 10 mg daily.  13. Crestor 20 mg daily.  14. Bumex 2 mg once daily.  15. Levothyroxine 160 mcg daily.  16. Zantac 150 mg daily.  17. Misoprostol 200 mcg twice daily.  18. Advair 250/50 one puff twice daily.    DISCHARGE DIET: Low sodium, 1800 ADA diet.    DISCHARGE INSTRUCTIONS    FOLLOWUP  [facility]. [facility].  2. Follow up with Dr. Welton Flakes on Monday, March 7.  3. Follow up with GI doctor in 6 weeks.    SPECIAL INSTRUCTIONS  1. Patient instructed to check blood sugar twice daily and take report to  primary care physician. His Lantus may need to be adjusted further for  better blood sugar control.  2. Patient instructed to use his CPAP at night at home.   3. He is to have lab on Monday with [facility] per Nephrology, Dr. Lucia Estelle,  recommendation for his kidney function.    PROCEDURES  1. Cardiac catheterization.  2. Pacemaker placement.    CONSULTATIONS  1. GI consultation, Dr. Nedra Hai for GI bleed.  2. Cardiology consultation, Dr. Welton Flakes for acute MI.  3. Nephrology consultation, Dr. Lucia Estelle for chronic kidney disease.    Please refer to full consultations.    HOSPITAL COURSE: This 69 year old patient presented to the ER with  abdominal and chest pain. Please refer to dictated history and physical  done by Dr. Selena Batten. He was admitted to the intensive care unit. Initially, he  was in some mild DKA and was on an insulin drip, which was discontinued and  back on Lantus and sliding scale. He had marked elevation of his troponin  on serial enzymes with a troponin maximum of 71. He underwent cardiac  catheterization. He had a recent stent on his admission in January, and  this was stable. The patient continued on medical management for his MI.  His beta-blocker was increased for blood pressure control and for the  recent MI. The patient was having second-degree Wenckebach block on  telemetry. His beta-blocker needed to be increased from his MI, so he  underwent pacemaker.    For his GI bleed, he was having rectal bleeding on arrival to the  emergency  room, seen by GI and continued on Zantac and misoprostol. Because of his  current MI and pacemaker need, he was managed conservatively. His  hemoglobin remained stable. He has anemia of chronic kidney disease  picture; however, because of his recent bleed, I advised to start  over-the-counter iron to have followup CBC with his primary care physician  on followup. He also some mild thrombocytopenia, which may be likely  related to the bleeding. His platelets were ranging between 124 to 114. He  also received Epogen shots ordered by nephrologist during hospitalization.   He will need to follow up with Dr. Nedra Hai, gastroenterologist, for evaluation  of this GI bleed when his MI issue and pacemaker resolves.    For his chronic kidney disease, he was followed by Dr. Lucia Estelle. Creatinine  remained stable during hospitalization. He was restarted on lisinopril and  Bumex.    For his diabetes, I did increase his Lantus dose from 40 to 46 units. He  will need to check his sugar and follow up with his primary care physician.  Also, he is on NovoLog before meals.    PHYSICAL EXAMINATION  GENERAL: On today's physical examination, the patient denies any  complaints, apart from some pain from the recent pacemaker. He was  oriented.  VITAL SIGNS: Temperature 98.6, pulse rate 100, respiratory rate 20, blood  pressure 154/56.  HEART: S1, S2 regular.  CHEST: Clear to auscultation.  ABDOMEN: Soft, nontender. Bowel sounds present.  EXTREMITIES: No edema.    LABORATORY DATA: Today's lab: Hemoglobin 9.2, platelets 114, white cell  count 5.6. Sodium 134, potassium 3.7, chloride 96, CO2 of 29, glucose 200,  BUN 26, creatinine 1.7.    Total time spent on coordination of patient discharge was 40 minutes.            Reviewed on 07/23/2009 11:51 AM          E-Signed By  Wendie Agreste, MD 07/26/2009 13:12    Wendie Agreste, MD    cc: Wendie Agreste, MD   Tharon Aquas, MD   Ian Malkin, M.D.   [name], M.D.    [facility]    MF/WMX; D: 07/21/2009 11:10 A; T: 07/22/2009 7:24 A; DOC# [id number]; Job#  [id number]

## 2009-07-24 MED ORDER — MISOPROSTOL 200 MCG TAB
200 mcg | ORAL_TABLET | Freq: Two times a day (BID) | ORAL | Status: DC
Start: 2009-07-24 — End: 2010-01-23

## 2009-07-24 NOTE — Telephone Encounter (Signed)
Pt needs to make a 10 day follow-up with dr. Everardo Beals  Please call [phone]    Thanks

## 2009-07-26 NOTE — Telephone Encounter (Signed)
appt made

## 2009-07-30 NOTE — Procedures (Addendum)
 [facility]   [address]   [place], [place] [address]      Name: Travis [name], Travis [name] Service Date: 07/29/2009  DOB: 27-Jul-1940 Ordered by:  MR # [id number] Location:  Sex: M Age: 69  Billing #: [id number] Date of Adm: 07/28/2009       LOWER EXTREMITY VENOUS DUPLEX      PROCEDURE: Right leg venous Doppler.    INDICATIONS: Pain and swelling, rule out deep vein thrombosis.    IMPRESSION: The results are as follows  1. Right leg shows no evidence of acute or chronic DVT or insufficiency.  2. The left common femoral vein is normal.        Reviewed on 07/31/2009 7:08 AM    E-Signed By  Morrell Riddle. Juline Patch, MD 08/02/2009  10:50  Morrell Riddle. Juline Patch, MD    cc: Anne Ng, DO   Morrell Riddle. Juline Patch, MD        FDS/WMX; D: 07/30/2009 3:23 P; T: 07/30/2009 10:03 P; DOC# [id number]; JOB#  [id number]

## 2009-07-31 MED ORDER — HYDRALAZINE 10 MG TAB
10 mg | ORAL_TABLET | Freq: Three times a day (TID) | ORAL | Status: DC
Start: 2009-07-31 — End: 2009-09-20

## 2009-07-31 NOTE — Progress Notes (Signed)
History of Present Illness:  Was discharged from hospital OK.  He had a brief episode of tightness in leg the day of discharge, but since then has had right leg pain.  Hurts in one spot in the upper leg and another spot in the lower leg.  Worse standing and walking, but pain free at rest.  No chest pain or SOB.  Was in [place] ER to evaluate and had a negative doppler.  Pain is getting a little better.  Also notes BP a little low last night and he was a little dizzy.  He was taking clonidine and hydralazine until today.    Past Medical History   Diagnosis Date   ??? CAD (coronary artery disease)      see problem list   ??? HTN (hypertension)    ??? PVD (peripheral vascular disease)    ??? DM (diabetes mellitus)    ??? Hyperlipidemia    ??? Sleep apnea      on CPAP   ??? COPD (chronic obstructive pulmonary disease)      mild COPD, recurrent pneumonia, chronic bronchitis (followed by Dr. Leroy Libman)   ??? CKD (chronic kidney disease)      Cr 1.7 in 10/09   ??? CHF (congestive heart failure)    ??? Anemia    ??? Pulmonary hypertension      PASP 60 on echo 06/22/09       Problem List Date Reviewed: 06/29/2009      Class Noted    CHF (congestive heart failure) [428.0R]  Unknown    Overview      Signed Sun Apr 23, 2009  4:25 PM by Laddie Aquas, MD     Preserved LV function          CAD (coronary artery disease) [414.00AE]  Unknown    Overview      Addended Thu Jun 29, 2009  9:55 PM by Laddie Aquas, MD     Cardiac History:    Mr. Jagiello has known coronary artery disease dating back to May 1999 when he underwent cardiac catheterization prior to planned peripheral vascular surgery. This showed distal disease in the apical LAD and right coronary artery with a normal ejection fraction.Non q MI in 7/06.cath showed mod LAD abn RCA disease up to 50-60% and a small occluded LCX with normal LVEF.  Had elevated troponin (?NSTEMI) during admission with pneumonia and ARF 05/2009 - proceeded to have a PCI   (DES) to RCA.  He returned for a NSTEMI (trop ~30) on 06/21/09 but cath showed stable findings.    Prior Cardiac Eval:   1) stress test 02/09/07. The type of test was a dobutamine stress perfusion imaging. Cardiac stress testing was negative for chest pain and myocardial ischemia. Myocardial perfusion imaging showed reversible defect(s) involving the inferior wall. Gated EF (%): 55.   2) echocardiogram 01/19/07. Echocardiogram demonstrated normal LV wall motion and ejection fraction, LVH and left atrial enlargement.   3) Carotid Dopplers in July 2009 showed bilateral 50-79% stenosis.   4) Carotids 07/08/08 - 50-79% stenosis bilat  5) Echo 04/03/09 - TDS, moderate LVH, LVEF 65%; moderate LAE, RAE, mild AS (mean gradient 10), mild MR  6) Echo 1/11 - mild LVH, LAE, EF 55%, mild Pulm HTN, mild MR  7) Cath 06/05/09- DES to RCA  8) cath 06/22/09 - LAD 50%, LCX 100%, mid RCA patent stent, distal RCA 40%  9) Echo 06/22/09 - mild LVH, EF 55-60%, mild LAE, mild MR/TR, PASP 60  Current outpatient prescriptions   Medication Sig Dispense Refill   ??? misoprostol (CYTOTEC) 200 mcg tablet Take 1 Tab by mouth two (2) times a day.  60 Tab  3   ??? bumetanide (BUMEX) 2 mg tablet Take 2 mg by mouth two (2) times a day.       ??? lisinopril (PRINIVIL, ZESTRIL) 20 mg tablet Take  by mouth daily.       ??? clopidogrel (PLAVIX) 75 mg tablet Take  by mouth daily.       ??? amlodipine (NORVASC) 10 mg tablet Take  by mouth daily.        ??? rosuvastatin (CRESTOR) 20 mg tablet Take 20 mg by mouth daily.       ??? terazosin (HYTRIN) 5 mg capsule Take 10 mg by mouth nightly.       ??? isosorbide mononitrate ER (IMDUR) 60 mg CR tablet Take 1 Tab by mouth every morning.  30 Tab  6   ??? aspirin 81 mg tablet Take  by mouth daily.       ??? omega-3 fatty acids-vitamin e (FISH OIL) 1,000 mg Cap Take  by mouth two (2) times a day.       ??? insulin glargine (LANTUS) 100 unit/mL injection 42 Units by SubCUTAneous route once.       ??? levothyroxine (SYNTHROID) 150 mcg tablet Take  by mouth daily (before breakfast).       ??? METOPROLOL TARTRATE PO Take 25 mg by mouth two (2) times a day.       ??? nitroglycerin (NITROSTAT) 0.4 mg SL tablet by SubLINGual route every five (5) minutes as needed.       ??? insulin aspart (NOVOLOG) 100 unit/mL injection by SubCUTAneous route.       ??? ranitidine hcl (ZANTAC) 150 mg capsule Take 150 mg by mouth two (2) times a day.         No Known Allergies     Social History:   Patient does not smoke. Denies problems with alcohol.     Review of Systems:   Constitutional: Negative for fever, chills, weight loss, malaise/fatigue and diaphoresis.   HEENT: Negative for nosebleeds, congestion, neck pain, tinnitus, and vision changes.   Respiratory: Negative for hemoptysis, sputum production, and wheezing.   Gastrointestinal: Negative for nausea, vomiting, abdominal pain, diarrhea, constipation, blood in stool and melena.   Genitourinary: Negative for dysuria, urgency, frequency and hematuria.   Musculoskeletal: Negative for myalgias.   Skin: Negative for rash and itching.   Heme: Does not bleed or bruise easily.   Neurological: Negative for speech change and focal weakness.     Physical Exam:   BP 154/62   Pulse 80   Patient appears generally well, mood and affect are appropriate and pleasant.   HEENT: Normocephalic, atraumatic.   Neck Exam: Supple, Left carotid bruit.   Lung Exam: Clear to auscultation, even breath sounds.    Cardiac Exam: Regular rate and rhythm with 2/6 systolic murmur. No rub or S3.   Abdomen: Soft, non-tender, normal bowel sounds. No bruits or masses.   Extremities: 1+ lower extremity edema to knees.   Vascular: difficult to palpate dorsalis pedis pulses bilaterally.     Assessment and Plan:  1) leg pain - no mass on exam along with a normal doppler during his ER visit lately.  His pain he says is better now.  His is to see his vascular surgeon regarding the pain if it continues.  2) HTN -  He is to stop clonidine altogether, but will resume hydralazine     Tharon Aquas, MD, [facility]

## 2009-07-31 NOTE — Telephone Encounter (Signed)
Please call patient's daughter [name] at [phone].  She has a question about her father's medication.    Thanks

## 2009-07-31 NOTE — Telephone Encounter (Signed)
Spoke with the dtr and confirmed with her that the hydralazine is 2.5mg  TID.

## 2009-08-03 LAB — CBC WITH AUTOMATED DIFF
ABS. BASOPHILS: 0 10*3/uL (ref 0.0–0.1)
ABS. EOSINOPHILS: 0.1 10*3/uL (ref 0.0–0.4)
ABS. LYMPHOCYTES: 1.2 10*3/uL (ref 0.8–3.5)
ABS. MONOCYTES: 0.7 10*3/uL (ref 0.0–1.0)
ABS. NEUTROPHILS: 5.2 10*3/uL (ref 1.8–8.0)
BASOPHILS: 0 % (ref 0–1)
EOSINOPHILS: 1 % (ref 0–7)
HCT: 27.5 % — ABNORMAL LOW (ref 36.6–50.3)
HGB: 8.9 g/dL — ABNORMAL LOW (ref 12.1–17.0)
LYMPHOCYTES: 16 % (ref 12–49)
MCH: 28.5 PG (ref 26.0–34.0)
MCHC: 32.4 g/dL (ref 30.0–36.5)
MCV: 88.1 FL (ref 80.0–99.0)
MONOCYTES: 10 % (ref 5–13)
NEUTROPHILS: 73 % (ref 32–75)
PLATELET: 238 10*3/uL (ref 150–400)
RBC: 3.12 M/uL — ABNORMAL LOW (ref 4.10–5.70)
RDW: 16.3 % — ABNORMAL HIGH (ref 11.5–14.5)
WBC: 7.2 10*3/uL (ref 4.1–11.1)

## 2009-08-03 LAB — URINALYSIS W/ REFLEX CULTURE
Bacteria: NEGATIVE /HPF
Bilirubin: NEGATIVE
Blood: NEGATIVE
Glucose: NEGATIVE MG/DL
Ketone: NEGATIVE MG/DL
Leukocyte Esterase: NEGATIVE
Nitrites: NEGATIVE
Protein: 30 MG/DL — AB
Specific gravity: 1.011 (ref 1.003–1.030)
Urobilinogen: 0.2 EU/DL (ref 0.2–1.0)
pH (UA): 5 (ref 5.0–8.0)

## 2009-08-03 LAB — HEPATIC FUNCTION PANEL
A-G Ratio: 0.6 — ABNORMAL LOW (ref 1.1–2.2)
ALT (SGPT): 23 U/L (ref 12–78)
AST (SGOT): 27 U/L (ref 15–37)
Albumin: 3.1 g/dL — ABNORMAL LOW (ref 3.5–5.0)
Alk. phosphatase: 91 U/L (ref 50–136)
Bilirubin, direct: 0.1 MG/DL (ref 0.0–0.2)
Bilirubin, total: 0.3 MG/DL (ref 0.2–1.0)
Globulin: 5.1 g/dL — ABNORMAL HIGH (ref 2.0–4.0)
Protein, total: 8.2 g/dL (ref 6.4–8.2)

## 2009-08-03 LAB — POC TROPONIN-I: Troponin-I (POC): <0.04 ng/mL (ref 0.00–0.08)

## 2009-08-03 LAB — POC CHEM8
Anion gap (POC): 12 (ref 5–15)
BUN (POC): 70 MG/DL — ABNORMAL HIGH (ref 9–20)
CO2 (POC): 31 MMOL/L (ref 21–32)
Calcium, ionized (POC): 1.06 MMOL/L — ABNORMAL LOW (ref 1.12–1.32)
Chloride (POC): 100 MMOL/L (ref 98–107)
Creatinine (POC): 3.9 MG/DL — ABNORMAL HIGH (ref 0.6–1.3)
GFRAA, POC: 20 mL/min/{1.73_m2} — ABNORMAL LOW (ref >60)
GFRNA, POC: 16 mL/min/{1.73_m2} — ABNORMAL LOW (ref >60)
Glucose (POC): 92 MG/DL (ref 75–110)
Hematocrit (POC): 27 % — ABNORMAL LOW (ref 36.6–50.3)
Hemoglobin (POC): 9.2 GM/DL — ABNORMAL LOW (ref 12.1–17.0)
Potassium (POC): 4.7 MMOL/L (ref 3.5–5.1)
Sodium (POC): 137 MMOL/L (ref 137–145)

## 2009-08-03 LAB — CK W/ REFLX CKMB: CK: 194 U/L (ref 35–232)

## 2009-08-03 LAB — MAGNESIUM: Magnesium: 2.2 MG/DL (ref 1.6–2.4)

## 2009-08-03 LAB — NT-PRO BNP: NT pro-BNP: 2333 PG/ML — ABNORMAL HIGH (ref 0–125)

## 2009-08-03 NOTE — H&P (Addendum)
Name: Travis [name], QUERRY Admitted: 08/03/2009  MR #: [id number] DOB: [birth year]  Account #: [id number] Age: 69  Physician: Orpah Clinton, MD Location:     HISTORY PHYSICAL      SERVICE: Hospital Medicine.    ATTENDING PHYSICIAN: Marijo File, MD    PRIMARY CARE PHYSICIAN: [facility]    CHIEF COMPLAINT: Cough.    HISTORY OF PRESENT ILLNESS: The patient is a 69 year old African American  gentleman who is admitted with acute bronchitis. He presented to the  emergency department complaining of cough present for the last 2 to 3 days.  It has been intermittent, it has been moderate. It has been associated with  wheezing and congestion. He also reports right lower extremity pain which  has been progressive. It has been present for several days, possibly as  long as 2 weeks, and it has been associated with pain and swelling. He has  been in the process of having it evaluated by his vascular surgeon.    PAST MEDICAL HISTORY: Coronary disease; atrioventricular block, status  post pacemaker placement; diabetes mellitus; COPD; coronary artery disease;  obstructive sleep apnea; hyperlipidemia; and peripheral vascular disease.    ALLERGIES: NO KNOWN DRUG ALLERGIES.    MEDICATIONS  1. Aspirin 81 mg daily.  2. Fish oil daily.  3. Imdur 60 mg daily.  4. Levothyroxine 150 mcg daily.  5. Zantac 150 mg daily.  6. Crestor 20 mg at bedtime.  7. Bumex 2 mg daily.  8. Lantus 42 units every day.  9. Metoprolol 25 units b.i.d.  10. Lisinopril 20 mg daily.  11. Plavix 75 mg daily.  12. Amlodipine 10 mg daily.  13. Misoprostol 200 mg t.i.d.  14. Hydralazine 25 mg t.i.d.    SOCIAL HISTORY: The patient denies tobacco use. He drinks alcohol  occasionally.    FAMILY HISTORY: The patient is not sure of his family history.    REVIEW OF SYSTEMS: For a comprehensive review of systems, please see the  reverse of the handwritten note in the patient's chart.    PHYSICAL EXAMINATION   VITAL SIGNS: Blood pressure 94/40, pulse 69, respiratory rate 18,  temperature 98.2.  GENERAL: He is a well-nourished, well-developed, elderly African American  gentleman in no acute distress.  HEENT: Pink conjunctivae. Pupils equal, round, react to light. Hearing  intact to voice. Moist mucous membranes.  NECK: Supple without masses. Thyroid is nontender.  RESPIRATORY: A few scattered rhonchi, otherwise clear without wheezes or  rales.  CARDIOVASCULAR: Normal S1, S2 without thrills, bruits, or peripheral  edema.  ABDOMEN: Soft, nontender, nondistended with normoactive bowel sounds. No  palpable organomegaly. No detectable hernias.  LYMPHATIC: No cervical or inguinal adenopathy.  MUSCULOSKELETAL: No cyanosis or clubbing.  SKIN: No rashes. There is a dusky color to the right lower extremity below  the knee, with chronic skin changes consistent with peripheral vascular  disease. The right lower extremity is diffusely cool. There is adequate  capillary refill. There are scattered nonhealing ulcers present on the  right lower extremity. Skin turgor is good.  NEUROLOGIC: Cranial nerves grossly intact. There is no focal motor  weakness. The patient follows commands appropriately.  PSYCHIATRIC: The patient has good insight. He is awake and oriented to  person, place, and time. He is alert.    LABORATORY: Sodium 137, potassium 4.7, chloride 100, bicarbonate 31, BUN  70, creatinine 3.9, glucose 92. White blood count 7.2, hemoglobin 8.9,  hematocrit 27.5, platelet count 238, troponin less than 0.04. Magnesium  2.2. Urinalysis negative.    Chest x-ray: No acute findings.    EKG: Ventricularly paced.    ASSESSMENT AND PLAN  1. Bronchitis. The patient does have mild bronchitis. We will treat this  with levofloxacin and with Mucinex.  2. Acute renal failure on chronic kidney disease, stage 3. We will hydrate  him with gentle intravenous fluids. We will consult Renal. We will obtain a   renal ultrasound. The patient has had multiple vascular interventions in  the last 4 weeks including cardiac catheterization with contrast dye and a  pacemaker placement. I wonder if he has contrast-induced nephropathy or  atherosclerotic emboli as the etiology of his worsening right lower  extremity peripheral vascular disease and acute renal failure.  3. Diabetes mellitus. We will continue him on his usual dose of insulin and  follow his blood sugars.  4. Hypertension. Blood pressure is actually hypotensive. For now we will  hold his blood pressure medicines and follow his blood pressure.  5. Peripheral vascular disease. We will obtain lower extremity arterial  Dopplers and a vascular surgery consult. The patient's vascular surgeon is  Dr. Richrd Sox; his group does not come to [facility]. [name]. I spent  approximately 70 minutes in evaluation and treatment of this patient.        Reviewed on 08/03/2009 3:29 PM        E-Signed By  Orpah Clinton, MD 08/06/2009 17:47    Orpah Clinton, MD    cc: Orpah Clinton, MD   Warnell Forester, M.D.    [facility]    CEB/WMX; D: 08/03/2009 2:47 P; T: 08/03/2009 3:16 P; DOC# [id number]; Job#  [id number]

## 2009-08-03 NOTE — Consults (Addendum)
Name: Travis [name], Travis [name] Admitted: 08/03/2009  MR #: [id number] DOB: 1940/08/29  Account #: [id number] Age: 69  Consultant: Ross Marcus, MD Location: [phone]     CONSULTATION REPORT    DATE OF CONSULTATION: 08/03/2009      ATTENDING PHYSICIAN: Marijo File, MD    CONSULTING PHYSICIAN: Davy Pique, MD    PRIMARY CARE PHYSICIAN: [place].    HISTORY OF PRESENT ILLNESS: The patient is a very pleasant 69 year old  African American male with a history of diabetes mellitus, coronary artery  disease, and peripheral vascular disease, who presented today to the  emergency department with a chief complaint of cough for the past 2-3 days  as well as right lower extremity pain for approximately 2 weeks. On talking  with the patient and his family this afternoon they tell me the patient has  been experiencing severe pain in his right lower extremity for  approximately 2-3 days. However, the patient does note that he has been  experiencing claudication-type pain for the past 2-3 years. He states for  the past 2 weeks he has noted that his right lower extremity has been what  as he describes as "cold and aching all the time." He states this has  started after his heart catheter exam on 07/18/09.He notes over the past few  days the pain has increased in intensity to the point in which he is not  able to bear weight. Of note, the patient has had 2 former bypasses on his  leg, the most recent being in 2006 by Dr. Richrd Sox. The patient  describes what sounds to be a femoropopliteal bypass of his leg. He denies  any history of hemoptysis, hematemesis, chest pain, melena, hematochezia,  or any other extremity pain.    On admission today, the patient's creatinine is elevated at 3.9 and he also  notes the patient's BNP is also elevated at 2333.    PAST MEDICAL HISTORY: Coronary artery disease, A-V block, diabetes  mellitus, COPD, coronary artery disease, obstructive sleep apnea,   hyperlipidemia, peripheral vascular disease.    PAST SURGICAL HISTORY: Right total knee replacement, history of what  sounds to be a femoropopliteal bypass x2, most recent 2006, pacemaker  placement.    ALLERGIES: NO KNOWN DRUG ALLERGIES.    MEDICATIONS  1. Aspirin.  2. Fish oil.  3. Imdur.  4. Levothyroxine.  5. Zantac.  6. Crestor.  7. Bumex.  8. Lantus.  9. Metoprolol.  10. Lisinopril.  11. Plavix.  12. Amlodipine.  13. Misoprostol.  14. Hydralazine.    SOCIAL HISTORY: Negative for any tobacco use. States he drinks on  occasions.    FAMILY HISTORY: Noncontributory.    REVIEW OF SYSTEMS  GENERAL: Negative for nausea, vomiting, change in weight, sleeping habits  or general health.  SKIN: Denies change in color, rash, sores, pain, or pruritus.  HEENT: Denies headaches, migraines, vision change, or hearing change.  RESPIRATORY: Denies pleuritic pain, tachypnea, asthma, bronchitis, or  COPD.  CARDIOVASCULAR: Denies no recent angina or palpitations.  GASTROINTESTINAL: Denies nausea, vomiting, hematemesis, melena, or  hematochezia.  GENITOURINARY: Denies frequency, polyuria, dysuria, nocturia, or flank  pain.  NEUROLOGIC: Denies vertigo, syncope, seizures, paralysis, or loss of  consciousness.  PSYCHIATRIC: Denies nervousness, depression, hallucinations, or suicidal  ideations.    PHYSICAL EXAMINATION  GENERAL: The patient is awake, alert and oriented to person, place and  time, lying in his ER bed and appears to be in no acute distress.  VITAL SIGNS: Temperature is  98.2. Pulse 81. Respiratory rate is 18. Blood  pressure is 98/78.  SKIN: No rash or jaundice noted.  HEENT: Normocephalic, atraumatic. Pupils are equal, round, and reactive to  light bilaterally. Extraocular muscles are intact bilaterally. The nose is  patent. The pharynx, no exudate.  CARDIAC: Regular rate and rhythm. No murmurs, rubs or gallops.  LUNGS: Clear to auscultation bilaterally. Breath sounds vesicular.   ABDOMEN: Soft, nontender, and nondistended. Bowel sounds auscultated x4.  No masses or pulsations are palpated.  EXTREMITIES: The right lower extremity is notably cooler to the touch when  compared to the left. There is a 1+ femoral pulse palpated on the right ,  +3 femoral pulse on the left lower extremity and 1+ popliteal pulse  palpated on the left , however, no dorsalis pedis or posterior tibialis  pulses are appreciated. With Doppler, there is an extremely faint dorsalis  pedis and an extremely faint posterior tibialis pulse  auscultated.Capilllary refill 2-3 sec on the right On the left lower  extremity, the foot is warm with faint dp and pt signals.Light touch is  appreciated on both feet slightly less so on the right. Motor function  intact and symetric.Dry scaly skin right foot.    LABORATORY DATA: White blood cell count 7.2, hemoglobin 8.9, hematocrit  27.5, platelets 238. PT is 13.4, INR 1.3. BNP 2333. Sodium 137, potassium  4.7, chloride 100, bicarbonate 31, BUN 70, creatinine 3.9, glucose 92.    IMPRESSION: Peripheral arterial disease, with restpain.Suspect chronic  bypass occlusion.Foot does not appear accutely threatened.    PLAN: At this time, we will go ahead and order stat PVRs to further  evaluate. In regard to the patient's increased creatinine at this point, we  will also begin to hydrate as well as add Mucomyst. We will plan to take  him to the catheterization lab tomorrow morning for angiography as well as  possibly angioplasty by Dr. Davy Pique if significant improvement of  renal function can be achieved.  I would like to thank Dr. Verlin Fester for this kind consultation.    DICTATED BY: Stephannie Peters, PA        Reviewed on 08/04/2009 10:17 AM        E-Signed By  Ross Marcus, MD 08/04/2009 11:39    Ross Marcus, MD    cc: Orpah Clinton, MD   Warnell Forester, M.D.   Ross Marcus, MD        CDS/WMX; D: 08/03/2009 4:44 P; T: 08/03/2009 6:10 P; DOC# [id number]; Job#   [id number]

## 2009-08-04 LAB — GLUCOSE, POC
Glucose (POC): 248 mg/dL — ABNORMAL HIGH (ref 75–110)
Glucose (POC): 378 mg/dL — ABNORMAL HIGH (ref 75–110)
Glucose (POC): 62 mg/dL — ABNORMAL LOW (ref 75–110)
Glucose (POC): 68 mg/dL — ABNORMAL LOW (ref 75–110)
Glucose (POC): 99 mg/dL (ref 75–110)
Glucose (POC): 99 mg/dL (ref 75–110)

## 2009-08-04 LAB — CBC WITH AUTOMATED DIFF
ABS. BASOPHILS: 0 10*3/uL (ref 0.0–0.1)
ABS. EOSINOPHILS: 0.1 10*3/uL (ref 0.0–0.4)
ABS. LYMPHOCYTES: 1.5 10*3/uL (ref 0.8–3.5)
ABS. MONOCYTES: 0.8 10*3/uL (ref 0.0–1.0)
ABS. NEUTROPHILS: 4.2 10*3/uL (ref 1.8–8.0)
BASOPHILS: 0 % (ref 0–1)
EOSINOPHILS: 1 % (ref 0–7)
HCT: 27.3 % — ABNORMAL LOW (ref 36.6–50.3)
HGB: 8.9 g/dL — ABNORMAL LOW (ref 12.1–17.0)
LYMPHOCYTES: 23 % (ref 12–49)
MCH: 28.9 PG (ref 26.0–34.0)
MCHC: 32.6 g/dL (ref 30.0–36.5)
MCV: 88.6 FL (ref 80.0–99.0)
MONOCYTES: 12 % (ref 5–13)
NEUTROPHILS: 64 % (ref 32–75)
PLATELET: 211 10*3/uL (ref 150–400)
RBC: 3.08 M/uL — ABNORMAL LOW (ref 4.10–5.70)
RDW: 16.3 % — ABNORMAL HIGH (ref 11.5–14.5)
WBC: 6.6 10*3/uL (ref 4.1–11.1)

## 2009-08-04 LAB — RENAL FUNCTION PANEL
Albumin: 2.7 g/dL — ABNORMAL LOW (ref 3.5–5.0)
Anion gap: 11 mmol/L (ref 5–15)
BUN/Creatinine ratio: 22 — ABNORMAL HIGH (ref 12–20)
BUN: 71 MG/DL — ABNORMAL HIGH (ref 6–20)
CO2: 30 MMOL/L (ref 21–32)
Calcium: 8.6 MG/DL (ref 8.5–10.1)
Chloride: 97 MMOL/L (ref 97–108)
Creatinine: 3.3 MG/DL — ABNORMAL HIGH (ref 0.6–1.3)
GFR est AA: 24 mL/min/{1.73_m2} — ABNORMAL LOW (ref >60)
GFR est non-AA: 20 mL/min/{1.73_m2} — ABNORMAL LOW (ref >60)
Glucose: 67 MG/DL (ref 65–100)
Phosphorus: 4.8 MG/DL (ref 2.5–4.9)
Potassium: 4.2 MMOL/L (ref 3.5–5.1)
Sodium: 138 MMOL/L (ref 136–145)

## 2009-08-04 LAB — URIC ACID: Uric acid: 12.7 MG/DL — ABNORMAL HIGH (ref 3.5–7.2)

## 2009-08-04 LAB — PROTHROMBIN TIME + INR
INR: 1.3 — ABNORMAL HIGH (ref 0.9–1.1)
Prothrombin time: 13.3 SECS — ABNORMAL HIGH (ref 9.0–11.0)

## 2009-08-04 LAB — MAGNESIUM: Magnesium: 2.1 MG/DL (ref 1.6–2.4)

## 2009-08-04 NOTE — Consults (Unsigned)
Name: Travis [name], CORVIN Admitted: 08/03/2009  MR #: [id number] DOB: [birth year]  Account #: [id number] Age: 69  Consultant: Gigi Gin, M.D. Location: [id number]     CONSULTATION REPORT    DATE OF CONSULTATION: 08/03/2009      REASON FOR CONSULTATION: I was asked to see this patient for evaluation of  worsening renal dysfunction.    HISTORY OF PRESENT ILLNESS: This is a 68 year old African American  gentleman who was admitted to the hospital by Dr. Verlin Fester for acute  bronchitis. He apparently presented to the emergency room complaining of  cough for a couple days and also complained of right lower extremity pain.  He was seen by the vascular surgeon with arterial Doppler studies being  done at the time of my evaluation. They also noted that his kidney function  was worse and asked me to come and see the patient. Serum creatinine today  was 3.9 mg% when baseline is somewhere in the 2-2.5 mg% range.    We were asked to see by Dr. Verlin Fester for evaluation of kidney  dysfunction.    PAST MEDICAL HISTORY: Past medical problems include:  1. Coronary artery disease.  2. AV block, post pacemaker.  3. Type 2 diabetes mellitus.  4. COPD.  5. Sleep apnea.  6. Hyperlipidemia.  7. Peripheral vascular disease with multiple prior procedures.    MEDICATIONS: Include:  1. Aspirin.  2. Fish oil.  3. Imdur.  4. L-thyroxine.  5. Zantac.  6. Crestor.  7. Bumex.  8. Lantus.  9. Metoprolol.  10. Lisinopril.  11. Plavix.  12. Amlodipine.  13. Misoprostol.  14. Hydralazine.    SOCIAL HISTORY: He does not smoke or drink to excess. His wife was a  chronic kidney patient of Dr. Levy Sjogren, known to both me and Dr. Levy Sjogren.    FAMILY HISTORY: Negative for significant renal disease.    REVIEW OF SYSTEMS: On questioning he denies any ongoing problems with  chest pain, shortness of breath, increasing orthopnea, PND. No nausea,  vomiting, diarrhea, constipation, hematemesis, hematochezia, melenic   stools. He denies any pleuritic chest pain, but does have productive cough  and a recent change in sputum production consistent with an acute  bronchitis. Review of systems otherwise entirely were negative.    PHYSICAL EXAMINATION  GENERAL: This is a well-developed, well-nourished, African-American  gentleman.  VITAL SIGNS: Blood pressure was 90/60, pulse 68 and regular, respirations  12, temperature was afebrile.  SKIN: Pale.  NODES: Negative.  HEENT: Normocephalic, atraumatic. Pupils equal, round, and reactive to  light. Extraocular movements are intact. ENT clear.  NECK: Supple.  LUNGS: Clear.  HEART: Regular rate and rhythm without clicks, murmurs, gallops, or rubs  noted.  ABDOMEN: Normoactive bowel sounds. Abdomen soft, nondistended, nontender.  EXTREMITIES: No clubbing, but trace edema. There was cyanosis of the right  lower extremity.  NEUROLOGIC: Examination showed him to be alert and oriented to person,  place, time, and situation. Cranial nerves 2-12, motor, and sensory  examination intact. Cerebellar screening examination and deep tendon  reflexes not done.    LABORATORY DATA: Laboratory data from today showed a potassium of 4.7, CO2  of 31, creatinine 3.9. Hemoglobin 8.9.    IMPRESSION  1. Acute renal failure.  2. Chronic kidney disease, stage 3.  3. Long-term type 2 diabetes mellitus.  4. Underlying diabetic nephropathy.  5. Hypertension.  6. Atherosclerotic peripheral vascular disease with lower extremity  arterial Doppler studies pending.  7. Acute bronchitis.    DISCUSSION: He  has worsening renal dysfunction to certainly a presentation  he has had in the past usually associated with volume depletion. Usually in  this instance discontinuing medications and IV volume expansion are helpful  in terms of improving kidney function. The problem we have tonight is that  they are planning to do angiography in the morning. I would certainly  unless pushed hold off on angiography until the kidney function has   returned to baseline. Certainly the risk of acute kidney failure due to  contrast is lower if his kidney function is at its baseline. Towards that  end, I think we should check labwork early in the morning. If the  creatinine is not back down to baseline I would hold off on angiography  unless there is risk of limb loss by waiting another day or perhaps two.    RECOMMENDATIONS  1. Agree with antibiotics.  2. Agree with Mucomyst for now.  3. IV volume expansion (which has not been started at this point.)  4. Will follow serial labwork and have Dr. Lucia Estelle stop by to see him in  the morning.        Preliminary    H. Garner Gavel, M.D.    cc: Orpah Clinton, MD   Warnell Forester, M.D.   Gigi Gin, M.D.        HBP/wmx; D: 08/04/2009 12:05 A; T: 08/04/2009 4:20 A; DOC# [id number]; Job#  [id number]

## 2009-08-05 LAB — RENAL FUNCTION PANEL
Albumin: 3 g/dL — ABNORMAL LOW (ref 3.5–5.0)
Anion gap: 6 mmol/L (ref 5–15)
BUN/Creatinine ratio: 20 (ref 12–20)
BUN: 53 MG/DL — ABNORMAL HIGH (ref 6–20)
CO2: 29 MMOL/L (ref 21–32)
Calcium: 8.8 MG/DL (ref 8.5–10.1)
Chloride: 100 MMOL/L (ref 97–108)
Creatinine: 2.6 MG/DL — ABNORMAL HIGH (ref 0.6–1.3)
GFR est AA: 32 mL/min/{1.73_m2} — ABNORMAL LOW (ref >60)
GFR est non-AA: 26 mL/min/{1.73_m2} — ABNORMAL LOW (ref >60)
Glucose: 106 MG/DL — ABNORMAL HIGH (ref 65–100)
Phosphorus: 3.8 MG/DL (ref 2.5–4.9)
Potassium: 4.4 MMOL/L (ref 3.5–5.1)
Sodium: 135 MMOL/L — ABNORMAL LOW (ref 136–145)

## 2009-08-05 LAB — GLUCOSE, POC
Glucose (POC): 170 mg/dL — ABNORMAL HIGH (ref 75–110)
Glucose (POC): 118 mg/dL — ABNORMAL HIGH (ref 75–110)
Glucose (POC): 34 mg/dL — CL (ref 75–110)
Glucose (POC): 52 mg/dL — ABNORMAL LOW (ref 75–110)
Glucose (POC): 158 mg/dL — ABNORMAL HIGH (ref 75–110)
Glucose (POC): 145 mg/dL — ABNORMAL HIGH (ref 75–110)
Glucose (POC): 131 mg/dL — ABNORMAL HIGH (ref 75–110)

## 2009-08-05 LAB — CBC WITH AUTOMATED DIFF
ABS. BASOPHILS: 0 10*3/uL (ref 0.0–0.1)
ABS. EOSINOPHILS: 0.1 10*3/uL (ref 0.0–0.4)
ABS. LYMPHOCYTES: 1.8 10*3/uL (ref 0.8–3.5)
ABS. MONOCYTES: 0.7 10*3/uL (ref 0.0–1.0)
ABS. NEUTROPHILS: 3.6 10*3/uL (ref 1.8–8.0)
BASOPHILS: 0 % (ref 0–1)
EOSINOPHILS: 1 % (ref 0–7)
HCT: 29 % — ABNORMAL LOW (ref 36.6–50.3)
HGB: 9.4 g/dL — ABNORMAL LOW (ref 12.1–17.0)
LYMPHOCYTES: 29 % (ref 12–49)
MCH: 28.6 PG (ref 26.0–34.0)
MCHC: 32.4 g/dL (ref 30.0–36.5)
MCV: 88.1 FL (ref 80.0–99.0)
MONOCYTES: 12 % (ref 5–13)
NEUTROPHILS: 58 % (ref 32–75)
PLATELET: 212 10*3/uL (ref 150–400)
RBC: 3.29 M/uL — ABNORMAL LOW (ref 4.10–5.70)
RDW: 15.7 % — ABNORMAL HIGH (ref 11.5–14.5)
WBC: 6.2 10*3/uL (ref 4.1–11.1)

## 2009-08-05 LAB — PTT
aPTT: 119.6 s — CR (ref 24.0–33.0)
aPTT: 60 s — ABNORMAL HIGH (ref 24.0–33.0)
aPTT: 39.7 s — ABNORMAL HIGH (ref 24.0–33.0)
aPTT: 112.5 s — CR (ref 24.0–33.0)

## 2009-08-05 LAB — METABOLIC PANEL, BASIC
Anion gap: 5 mmol/L (ref 5–15)
BUN/Creatinine ratio: 23 — ABNORMAL HIGH (ref 12–20)
BUN: 63 MG/DL — ABNORMAL HIGH (ref 6–20)
CO2: 31 MMOL/L (ref 21–32)
Calcium: 8.8 MG/DL (ref 8.5–10.1)
Chloride: 102 MMOL/L (ref 97–108)
Creatinine: 2.7 MG/DL — ABNORMAL HIGH (ref 0.6–1.3)
GFR est AA: 30 mL/min/{1.73_m2} — ABNORMAL LOW (ref >60)
GFR est non-AA: 25 mL/min/{1.73_m2} — ABNORMAL LOW (ref >60)
Glucose: 27 MG/DL — CL (ref 65–100)
Potassium: 3.9 MMOL/L (ref 3.5–5.1)
Sodium: 138 MMOL/L (ref 136–145)

## 2009-08-05 NOTE — Procedures (Addendum)
[facility] [place] [facility]   [address]   [place], [place] [address]      Name: ARMANY, MANO Service Date: 08/03/2009  DOB: 1940/08/20 Ordered by:  MR # [id number] Location: [id number]  Sex: M Age: 69  Billing #: [id number] Date of Adm: 08/03/2009       LOWER EXTREMITY VENOUS DUPLEX      REASON FOR PROCEDURE: Right leg swelling.    FINDINGS: Pulsed Doppler spectral analysis shows fully competent deep and  superficial systems without significant reflux. B-mode color imaging shows  fully compressible vessels throughout without thrombosis or obstruction.    SUMMARY: Negative lower extremity venous duplex for thrombosis or  significant reflux.    E-Signed By  Alvina Filbert, MD 08/09/2009 10:43  Alvina Filbert, MD    cc: Orpah Clinton, MD   Alvina Filbert, MD        MW/WMX; D: 08/04/2009 8:58 A; T: 08/05/2009 7:40 A; DOC# [id number]; JOB#  [id number]

## 2009-08-05 NOTE — Procedures (Addendum)
[place] [facility]   [address]   [place], [place] [address]      Name: Travis [name], Travis [name] Service Date: 08/03/2009  DOB: 02-Dec-1940 Ordered by:  MR # [id number] Location: [id number]  Sex: M Age: 69  Billing #: [id number] Date of Adm.: 08/03/2009     PULSE VOLUME RECORDING    PROCEDURE: Lower extremity pulse volume recording.    REQUESTED BY: Davy Pique, MD    INDICATION: Rest pain, right leg and history of PAD.    FINDINGS: Resting ankle brachial indices were obtained and were  nonmeasurable, on the right with a toe index of 0.38; on the left toe index  0.23, with an ABI of 0.52.    Significantly blunted right thigh and nearly flat calf and ankle tracings  on the right, significant blunting of the left.    IMPRESSION: Advanced peripheral vascular occlusive disease, severe on the  right and moderate-to-severe on the left on the right. On the right,  significant inflow disease is suspected.        Reviewed on 08/07/2009 7:05 AM      E-Signed By  Ross Marcus, MD 08/15/2009 13:45  Ross Marcus, MD    cc:        CDS/WMX; D: 08/04/2009 7:36 A; T: 08/05/2009 6:37 A; DOC# [id number]; JOB#  [id number]

## 2009-08-06 LAB — CBC WITH AUTOMATED DIFF
ABS. BASOPHILS: 0 10*3/uL (ref 0.0–0.1)
ABS. EOSINOPHILS: 0.2 10*3/uL (ref 0.0–0.4)
ABS. LYMPHOCYTES: 3.5 10*3/uL (ref 0.8–3.5)
ABS. MONOCYTES: 0.7 10*3/uL (ref 0.0–1.0)
ABS. NEUTROPHILS: 2.7 10*3/uL (ref 1.8–8.0)
BASOPHILS: 0 % (ref 0–1)
EOSINOPHILS: 2 % (ref 0–7)
HCT: 28.7 % — ABNORMAL LOW (ref 36.6–50.3)
HGB: 9.2 g/dL — ABNORMAL LOW (ref 12.1–17.0)
LYMPHOCYTES: 50 % — ABNORMAL HIGH (ref 12–49)
MCH: 28.5 PG (ref 26.0–34.0)
MCHC: 32.1 g/dL (ref 30.0–36.5)
MCV: 88.9 FL (ref 80.0–99.0)
MONOCYTES: 10 % (ref 5–13)
NEUTROPHILS: 38 % (ref 32–75)
PLATELET: 220 10*3/uL (ref 150–400)
RBC: 3.23 M/uL — ABNORMAL LOW (ref 4.10–5.70)
RDW: 15.7 % — ABNORMAL HIGH (ref 11.5–14.5)
WBC: 7 10*3/uL (ref 4.1–11.1)

## 2009-08-06 LAB — GLUCOSE, POC
Glucose (POC): 266 mg/dL — ABNORMAL HIGH (ref 75–110)
Glucose (POC): 101 mg/dL (ref 75–110)
Glucose (POC): 70 mg/dL — ABNORMAL LOW (ref 75–110)
Glucose (POC): 72 mg/dL — ABNORMAL LOW (ref 75–110)
Glucose (POC): 298 mg/dL — ABNORMAL HIGH (ref 75–110)
Glucose (POC): 101 mg/dL (ref 75–110)

## 2009-08-06 LAB — PTT
aPTT: 56 s — ABNORMAL HIGH (ref 24.0–33.0)
aPTT: 62 s — ABNORMAL HIGH (ref 24.0–33.0)
aPTT: 58.4 s — ABNORMAL HIGH (ref 24.0–33.0)

## 2009-08-06 LAB — RENAL FUNCTION PANEL
Albumin: 3 g/dL — ABNORMAL LOW (ref 3.5–5.0)
Anion gap: 9 mmol/L (ref 5–15)
BUN/Creatinine ratio: 19 (ref 12–20)
BUN: 42 MG/DL — ABNORMAL HIGH (ref 6–20)
CO2: 29 MMOL/L (ref 21–32)
Calcium: 8.8 MG/DL (ref 8.5–10.1)
Chloride: 103 MMOL/L (ref 97–108)
Creatinine: 2.2 MG/DL — ABNORMAL HIGH (ref 0.6–1.3)
GFR est AA: 38 mL/min/{1.73_m2} — ABNORMAL LOW (ref >60)
GFR est non-AA: 32 mL/min/{1.73_m2} — ABNORMAL LOW (ref >60)
Glucose: 29 MG/DL — CL (ref 65–100)
Phosphorus: 3.6 MG/DL (ref 2.5–4.9)
Potassium: 3.6 MMOL/L (ref 3.5–5.1)
Sodium: 141 MMOL/L (ref 136–145)

## 2009-08-07 LAB — CBC WITH AUTOMATED DIFF
ABS. BASOPHILS: 0 10*3/uL (ref 0.0–0.1)
ABS. EOSINOPHILS: 0 10*3/uL (ref 0.0–0.4)
ABS. LYMPHOCYTES: 1.2 10*3/uL (ref 0.8–3.5)
ABS. MONOCYTES: 0.5 10*3/uL (ref 0.0–1.0)
ABS. NEUTROPHILS: 4.8 10*3/uL (ref 1.8–8.0)
BASOPHILS: 0 % (ref 0–1)
EOSINOPHILS: 0 % (ref 0–7)
HCT: 27.8 % — ABNORMAL LOW (ref 36.6–50.3)
HGB: 9 g/dL — ABNORMAL LOW (ref 12.1–17.0)
LYMPHOCYTES: 19 % (ref 12–49)
MCH: 28.5 PG (ref 26.0–34.0)
MCHC: 32.4 g/dL (ref 30.0–36.5)
MCV: 88 FL (ref 80.0–99.0)
MONOCYTES: 8 % (ref 5–13)
NEUTROPHILS: 73 % (ref 32–75)
PLATELET: 217 10*3/uL (ref 150–400)
RBC: 3.16 M/uL — ABNORMAL LOW (ref 4.10–5.70)
RDW: 15.9 % — ABNORMAL HIGH (ref 11.5–14.5)
WBC: 6.5 10*3/uL (ref 4.1–11.1)

## 2009-08-07 LAB — RENAL FUNCTION PANEL
Albumin: 2.7 g/dL — ABNORMAL LOW (ref 3.5–5.0)
Anion gap: 9 mmol/L (ref 5–15)
BUN/Creatinine ratio: 17 (ref 12–20)
BUN: 33 MG/DL — ABNORMAL HIGH (ref 6–20)
CO2: 27 MMOL/L (ref 21–32)
Calcium: 8.1 MG/DL — ABNORMAL LOW (ref 8.5–10.1)
Chloride: 98 MMOL/L (ref 97–108)
Creatinine: 1.9 MG/DL — ABNORMAL HIGH (ref 0.6–1.3)
GFR est AA: 45 mL/min/{1.73_m2} — ABNORMAL LOW (ref >60)
GFR est non-AA: 38 mL/min/{1.73_m2} — ABNORMAL LOW (ref >60)
Glucose: 195 MG/DL — ABNORMAL HIGH (ref 65–100)
Phosphorus: 2.5 MG/DL (ref 2.5–4.9)
Potassium: 4.3 MMOL/L (ref 3.5–5.1)
Sodium: 134 MMOL/L — ABNORMAL LOW (ref 136–145)

## 2009-08-07 LAB — GLUCOSE, POC
Glucose (POC): 69 mg/dL — ABNORMAL LOW (ref 75–110)
Glucose (POC): 79 mg/dL (ref 75–110)
Glucose (POC): 88 mg/dL (ref 75–110)
Glucose (POC): 249 mg/dL — ABNORMAL HIGH (ref 75–110)
Glucose (POC): 178 mg/dL — ABNORMAL HIGH (ref 75–110)
Glucose (POC): 375 mg/dL — ABNORMAL HIGH (ref 75–110)
Glucose (POC): 74 mg/dL — ABNORMAL LOW (ref 75–110)
Glucose (POC): 79 mg/dL (ref 75–110)
Glucose (POC): 78 mg/dL (ref 75–110)

## 2009-08-07 LAB — CULTURE, MRSA

## 2009-08-07 LAB — PTT
aPTT: 45.3 s — ABNORMAL HIGH (ref 24.0–33.0)
aPTT: 36.4 s — ABNORMAL HIGH (ref 24.0–33.0)

## 2009-08-08 LAB — CBC WITH AUTOMATED DIFF
ABS. BASOPHILS: 0 10*3/uL (ref 0.0–0.1)
ABS. EOSINOPHILS: 0 10*3/uL (ref 0.0–0.4)
ABS. LYMPHOCYTES: 1.2 10*3/uL (ref 0.8–3.5)
ABS. MONOCYTES: 0.8 10*3/uL (ref 0.0–1.0)
ABS. NEUTROPHILS: 7.4 10*3/uL (ref 1.8–8.0)
BASOPHILS: 0 % (ref 0–1)
EOSINOPHILS: 0 % (ref 0–7)
HCT: 28.4 % — ABNORMAL LOW (ref 36.6–50.3)
HGB: 9.1 g/dL — ABNORMAL LOW (ref 12.1–17.0)
LYMPHOCYTES: 13 % (ref 12–49)
MCH: 28.3 PG (ref 26.0–34.0)
MCHC: 32 g/dL (ref 30.0–36.5)
MCV: 88.5 FL (ref 80.0–99.0)
MONOCYTES: 8 % (ref 5–13)
NEUTROPHILS: 79 % — ABNORMAL HIGH (ref 32–75)
PLATELET: 209 10*3/uL (ref 150–400)
RBC: 3.21 M/uL — ABNORMAL LOW (ref 4.10–5.70)
RDW: 15.8 % — ABNORMAL HIGH (ref 11.5–14.5)
WBC: 9.5 10*3/uL (ref 4.1–11.1)

## 2009-08-08 LAB — PHOSPHORUS: Phosphorus: 2.3 MG/DL — ABNORMAL LOW (ref 2.5–4.9)

## 2009-08-08 LAB — GLUCOSE, POC
Glucose (POC): 156 mg/dL — ABNORMAL HIGH (ref 75–110)
Glucose (POC): 168 mg/dL — ABNORMAL HIGH (ref 75–110)
Glucose (POC): 216 mg/dL — ABNORMAL HIGH (ref 75–110)
Glucose (POC): 168 mg/dL — ABNORMAL HIGH (ref 75–110)

## 2009-08-08 LAB — METABOLIC PANEL, COMPREHENSIVE
A-G Ratio: 0.6 — ABNORMAL LOW (ref 1.1–2.2)
ALT (SGPT): 27 U/L (ref 12–78)
AST (SGOT): 44 U/L — ABNORMAL HIGH (ref 15–37)
Albumin: 3 g/dL — ABNORMAL LOW (ref 3.5–5.0)
Alk. phosphatase: 84 U/L (ref 50–136)
Anion gap: 12 mmol/L (ref 5–15)
BUN/Creatinine ratio: 16 (ref 12–20)
BUN: 28 MG/DL — ABNORMAL HIGH (ref 6–20)
Bilirubin, total: 0.3 MG/DL (ref 0.2–1.0)
CO2: 25 MMOL/L (ref 21–32)
Calcium: 8.4 MG/DL — ABNORMAL LOW (ref 8.5–10.1)
Chloride: 101 MMOL/L (ref 97–108)
Creatinine: 1.8 MG/DL — ABNORMAL HIGH (ref 0.6–1.3)
GFR est AA: 48 mL/min/{1.73_m2} — ABNORMAL LOW (ref >60)
GFR est non-AA: 40 mL/min/{1.73_m2} — ABNORMAL LOW (ref >60)
Globulin: 4.9 g/dL — ABNORMAL HIGH (ref 2.0–4.0)
Glucose: 141 MG/DL — ABNORMAL HIGH (ref 65–100)
Potassium: 4 MMOL/L (ref 3.5–5.1)
Protein, total: 7.9 g/dL (ref 6.4–8.2)
Sodium: 138 MMOL/L (ref 136–145)

## 2009-08-08 LAB — MAGNESIUM: Magnesium: 1.9 MG/DL (ref 1.6–2.4)

## 2009-08-08 LAB — CULTURE, BLOOD, PAIRED: Culture result:: NO GROWTH

## 2009-08-08 LAB — PTT
aPTT: 124.2 s — CR (ref 24.0–33.0)
aPTT: 54.1 s — ABNORMAL HIGH (ref 24.0–33.0)

## 2009-08-08 NOTE — Consults (Addendum)
Name: Travis [name], Travis [name] Admitted: 08/03/2009  MR #: [id number] DOB: 1940-11-19  Account #: [id number] Age: 69  Consultant: Courtney Paris, M.D. Location: [id number]     CONSULTATION REPORT    DATE OF CONSULTATION: 08/08/2009      REQUESTING PHYSICIAN: Suzan Garibaldi, MD    REASON FOR CONSULT: Monoclonal myopathy.    HISTORY OF PRESENT ILLNESS: The patient is a 69 year old with multiple  medical problems who was admitted with acute bronchitis and complaints of  right lower extremity pain. He does have a history of peripheral vascular  disease, and an angiogram is planned for evaluation of this. He has been  hospitalized on several occasions over the past couple of months. During  that time he has experienced GI bleed and intermittent worsening of his  chronic kidney disease. In January he had a urine immunofixation which  showed monoclonal IgG protein. He had a normal kappa lambda ratio in both  the urine and the serum. I am asked to see him today regarding this  monoclonal protein. He does have chronic anemia and this has been recently  exacerbated by intermittent worsening of his kidney disease and GI  bleeding. He reports that his cough is better. He also notes decreased pain  in his right lower extremity. He denies any new bone pain. He denies any  fever chills, anorexia, weight loss. He currently denies any shortness of  breath.    PAST MEDICAL HISTORY  1. Chronic kidney disease.  2. Anemia.  3. Coronary artery disease.  4. AV block, status post pacemaker.  5. Diabetes mellitus.  6. COPD.  7. Obstructive sleep apnea.  8. Hyperlipidemia.  9. Peripheral vascular disease.    CURRENT MEDICATIONS  1. Heparin.  2. Terazosin.  3. Levothyroxine.  4. Plavix.  5. Aspirin.  6. Misoprostol.  7. Fluticasone/salmeterol.  8. Famotidine.  9. Bumex.  10. Rosuvastatin.  11. Amlodipine.  12. Isordil.  13. Lisinopril.  14. Hydralazine.  15. Iron sulfate.  16. Metoprolol.  17. Levaquin.  18. Percocet.     ALLERGIES: NO KNOWN DRUG ALLERGIES.    SOCIAL HISTORY: He denies tobacco use. Drinks alcohol occasionally.    FAMILY HISTORY: Unclear on his family history.    REVIEW OF SYSTEMS: A complete 11-system review of systems was performed  and notable for those symptoms mentioned in the HPI. The remainder of the  review of systems was negative.    PHYSICAL EXAMINATION  VITAL SIGNS: Blood pressure 144/64, pulse 87, respirations 16, temp 99.0.  GENERAL: He is a well-developed male in no acute distress.  HEENT: Anicteric sclerae. Oropharynx moist mucosa. No lesions.  NECK: Supple.  LUNGS: Clear to auscultation bilaterally.  CARDIOVASCULAR: Regular rhythm.  ABDOMEN: Soft, nontender. No palpable hepatosplenomegaly.  LYMPHATIC: No palpable adenopathy.  EXTREMITIES: Chronic changes associated with his peripheral vascular  disease.  SKIN: No rash.  NEUROLOGIC: Alert and oriented x3. Grossly nonfocal.    LABORATORY DATA: White blood cell count 9.5, hemoglobin 9.1, platelets  209,000. BUN 28, creatinine 1.8, albumin 3.0, total protein 7.9, total  bilirubin 0.3.    IMPRESSION  1. Chronic kidney disease.  2. Anemia, this is likely multifactorial. His chronic kidney disease is a  likely contributor. He also had a gastrointestinal bleed recently.  3. Monoclonal gammopathy.    RECOMMENDATIONS  1. I will get a serum immunofixation.  2. We will check a bone survey.  3. We will follow up pending these results.      E-Signed By  Courtney Paris, M.D. 08/15/2009 12:06    Courtney Paris, M.D.    cc: Courtney Paris, M.D.        Hoy Morn; D: 08/08/2009 11:43 A; T: 08/08/2009 12:30 P; DOC# [id number]; Job#  [id number]

## 2009-08-09 LAB — CBC WITH AUTOMATED DIFF
ABS. BASOPHILS: 0 10*3/uL (ref 0.0–0.1)
ABS. EOSINOPHILS: 0.1 10*3/uL (ref 0.0–0.4)
ABS. LYMPHOCYTES: 1.6 10*3/uL (ref 0.8–3.5)
ABS. MONOCYTES: 0.8 10*3/uL (ref 0.0–1.0)
ABS. NEUTROPHILS: 5.3 10*3/uL (ref 1.8–8.0)
BASOPHILS: 0 % (ref 0–1)
EOSINOPHILS: 1 % (ref 0–7)
HCT: 24.1 % — ABNORMAL LOW (ref 36.6–50.3)
HGB: 7.8 g/dL — ABNORMAL LOW (ref 12.1–17.0)
LYMPHOCYTES: 21 % (ref 12–49)
MCH: 28.6 PG (ref 26.0–34.0)
MCHC: 32.4 g/dL (ref 30.0–36.5)
MCV: 88.3 FL (ref 80.0–99.0)
MONOCYTES: 11 % (ref 5–13)
NEUTROPHILS: 67 % (ref 32–75)
PLATELET: 175 10*3/uL (ref 150–400)
RBC: 2.73 M/uL — ABNORMAL LOW (ref 4.10–5.70)
RDW: 16 % — ABNORMAL HIGH (ref 11.5–14.5)
WBC: 7.8 10*3/uL (ref 4.1–11.1)

## 2009-08-09 LAB — RENAL FUNCTION PANEL
Albumin: 2.5 g/dL — ABNORMAL LOW (ref 3.5–5.0)
Anion gap: 8 mmol/L (ref 5–15)
BUN/Creatinine ratio: 17 (ref 12–20)
BUN: 24 MG/DL — ABNORMAL HIGH (ref 6–20)
CO2: 24 MMOL/L (ref 21–32)
Calcium: 8 MG/DL — ABNORMAL LOW (ref 8.5–10.1)
Chloride: 107 MMOL/L (ref 97–108)
Creatinine: 1.4 MG/DL — ABNORMAL HIGH (ref 0.6–1.3)
GFR est AA: >60 mL/min/{1.73_m2} (ref >60)
GFR est non-AA: 53 mL/min/{1.73_m2} — ABNORMAL LOW (ref >60)
Glucose: 97 MG/DL (ref 65–100)
Phosphorus: 2 MG/DL — ABNORMAL LOW (ref 2.5–4.9)
Potassium: 4 MMOL/L (ref 3.5–5.1)
Sodium: 139 MMOL/L (ref 136–145)

## 2009-08-09 LAB — CK W/ CKMB & INDEX
CK - MB: 10.7 NG/ML — ABNORMAL HIGH (ref 0.5–3.6)
CK-MB Index: 2.9 — ABNORMAL HIGH (ref 0–2.5)
CK: 372 U/L — ABNORMAL HIGH (ref 35–232)

## 2009-08-09 LAB — PROTHROMBIN TIME + INR
INR: 1.3 — ABNORMAL HIGH (ref 0.9–1.1)
Prothrombin time: 13.2 SECS — ABNORMAL HIGH (ref 9.0–11.0)

## 2009-08-09 LAB — GLUCOSE, POC
Glucose (POC): 277 mg/dL — ABNORMAL HIGH (ref 75–110)
Glucose (POC): 94 mg/dL (ref 75–110)
Glucose (POC): 487 mg/dL — ABNORMAL HIGH (ref 75–110)
Glucose (POC): 356 mg/dL — ABNORMAL HIGH (ref 75–110)

## 2009-08-09 LAB — MAGNESIUM: Magnesium: 1.9 MG/DL (ref 1.6–2.4)

## 2009-08-09 LAB — PTT
aPTT: 51.5 s — ABNORMAL HIGH (ref 24.0–33.0)
aPTT: 76.5 s — ABNORMAL HIGH (ref 24.0–33.0)

## 2009-08-09 LAB — TROPONIN I: Troponin-I, Qt.: 1.14 ng/mL — ABNORMAL HIGH (ref <0.05)

## 2009-08-09 NOTE — Procedures (Addendum)
Name: Travis [name], Travis [name] By:  MR #: [id number] Angio:  Account #: [id number] Technique:  DOB: [birth year] Room: [facility]     PERIPHERAL ANGIOPLASTY REPORT    DATE OF PROCEDURE: 08/08/2009   rptText PREOPERATIVE DIAGNOSIS: Ischemic right leg with failing graft.    PROCEDURE PERFORMED  1. Ultrasound-guided percutaneous access left common femoral artery,  aortogram, pelvic arteriogram, and right leg arteriogram.  2. Angioplasty right common femoral and proximal superficial femoral artery  angioplasty of proximal femoral-popliteal.    SURGEON: Davy Pique, MD    INDICATION: A 69 year old, who developed rest pain right leg status post  right transfemoral cardiac catheterization. He has had 2 prior bypass  surgeries in the right leg. Complains of rest pain.    PROCEDURE: He was prepared with Mucomyst and bicarbonate hydration as well  as several days of and anticoagulation which showed some improvement.    After obtaining informed consent, the patient's left groin was sterilely  prepped and draped. Ultrasound-guided access was performed with the  micropuncture set and a 5-French sheath was inserted. An arch aortogram was  obtained. The catheter was advanced across the bifurcation into the  contralateral external iliac artery for right leg arteriogram. The catheter  was advanced into the SFA, and then femoral-popliteal for right leg  arteriogram.    FINDINGS: The aortogram demonstrates patency of the aorta. The renals are  poorly visualized due to catheter position. Pelvic arteriogram demonstrates  patency of the common femoral with moderate bifurcation disease. The  hypogastrics showed advanced disease bilaterally. The externals are  patent.    Right leg runoff demonstrates a flow-limiting plaque in the common femoral  at the takeoff of the SFA which is critically stenosed. A vein graft is  seen coming off the proximal SFA some 5 to 6 cm distal to the femoral   bifurcation. It is critically stenosed in its proximal portion. The  profunda is sizable. There is a proximal 60% to 70% profunda stenosis.    At the graft ties into a tiny above knee popliteal. The below-knee  popliteal was poorly visualized through a total knee prosthesis. Overall,  the popliteal seems to be ________ contoured, although very small. The  tibioperoneal trunk is patent. The anterior tibial artery is not seen. A  single-vessel runoff via a severely diseased peroneal artery is noted to  reach the ankle. The posterior tibial artery is proximally occluded and  reconstitutes for an above the ankle segment. It would be a bypass target.  Its continuity into the foot however, was not visualized, possibly due to  injection technique. Hand injections are done with half-strength contrast  due to patient's renal insufficiency.    The decision was made to intervene. The patient was heparinized. A  cross-and-over 7-French sheath was inserted, placing it into the distal  right external iliac artery. The femoral bifurcation is dilated with a 6  and later a 7 mm balloon with resolution of the critical stenosis. A  nonflow limiting dissection is felt to be acceptable. Next, the proximal  graft is dilated with a 5 mm balloon. There is some residual 20%  indentation on the balloon, and this was felt to be acceptable with good  flow result at the conclusion was noted. The profunda remains widely  patent.    IMPRESSION: Successful dilatation of the common femoral and proximal  superficial femoral artery as well as proximal graft with mild residual  stenosis and dissection proximally as well as the proximal graft level.  Reviewed on 08/09/2009 10:59 AM      E-Signed By  Ross Marcus, MD 08/15/2009 13:45  Ross Marcus, MD    cc: Orpah Clinton, MD   Ross Marcus, MD        CDS/WMX; D: 08/08/2009 6:17 P; T: 08/09/2009 10:45 A; DOC#: [id number]; Job#  [id number]

## 2009-08-10 LAB — TROPONIN I
Troponin-I, Qt.: 1.27 ng/mL — ABNORMAL HIGH (ref <0.05)
Troponin-I, Qt.: 1.27 ng/mL — ABNORMAL HIGH (ref <0.05)

## 2009-08-10 LAB — TYPE + CROSSMATCH
ABO/Rh(D): O POS
Antibody screen: NEGATIVE
Status of unit: TRANSFUSED
Unit division: 0

## 2009-08-10 LAB — CBC WITH AUTOMATED DIFF
ABS. BASOPHILS: 0 10*3/uL (ref 0.0–0.1)
ABS. EOSINOPHILS: 0.2 10*3/uL (ref 0.0–0.4)
ABS. LYMPHOCYTES: 2.1 10*3/uL (ref 0.8–3.5)
ABS. MONOCYTES: 1 10*3/uL (ref 0.0–1.0)
ABS. NEUTROPHILS: 5.5 10*3/uL (ref 1.8–8.0)
BASOPHILS: 0 % (ref 0–1)
EOSINOPHILS: 2 % (ref 0–7)
HCT: 30.6 % — ABNORMAL LOW (ref 36.6–50.3)
HGB: 10 g/dL — ABNORMAL LOW (ref 12.1–17.0)
LYMPHOCYTES: 24 % (ref 12–49)
MCH: 29 PG (ref 26.0–34.0)
MCHC: 32.7 g/dL (ref 30.0–36.5)
MCV: 88.7 FL (ref 80.0–99.0)
MONOCYTES: 11 % (ref 5–13)
NEUTROPHILS: 63 % (ref 32–75)
PLATELET: 178 10*3/uL (ref 150–400)
RBC: 3.45 M/uL — ABNORMAL LOW (ref 4.10–5.70)
RDW: 15.5 % — ABNORMAL HIGH (ref 11.5–14.5)
WBC: 8.8 10*3/uL (ref 4.1–11.1)

## 2009-08-10 LAB — RENAL FUNCTION PANEL
Albumin: 2.8 g/dL — ABNORMAL LOW (ref 3.5–5.0)
Anion gap: 11 mmol/L (ref 5–15)
BUN/Creatinine ratio: 14 (ref 12–20)
BUN: 21 MG/DL — ABNORMAL HIGH (ref 6–20)
CO2: 25 MMOL/L (ref 21–32)
Calcium: 8 MG/DL — ABNORMAL LOW (ref 8.5–10.1)
Chloride: 102 MMOL/L (ref 97–108)
Creatinine: 1.5 MG/DL — ABNORMAL HIGH (ref 0.6–1.3)
GFR est AA: 60 mL/min/{1.73_m2} — ABNORMAL LOW (ref >60)
GFR est non-AA: 49 mL/min/{1.73_m2} — ABNORMAL LOW (ref >60)
Glucose: 45 MG/DL — CL (ref 65–100)
Phosphorus: 2.6 MG/DL (ref 2.5–4.9)
Potassium: 3.4 MMOL/L — ABNORMAL LOW (ref 3.5–5.1)
Sodium: 138 MMOL/L (ref 136–145)

## 2009-08-10 LAB — CULTURE, MRSA

## 2009-08-10 LAB — CK W/ CKMB & INDEX
CK - MB: 7.2 NG/ML — ABNORMAL HIGH (ref 0.5–3.6)
CK - MB: 4.6 NG/ML — ABNORMAL HIGH (ref 0.5–3.6)
CK-MB Index: 2.3 (ref 0–2.5)
CK-MB Index: 1.7 (ref 0–2.5)
CK: 315 U/L — ABNORMAL HIGH (ref 35–232)
CK: 272 U/L — ABNORMAL HIGH (ref 35–232)

## 2009-08-10 LAB — GLUCOSE, POC
Glucose (POC): 288 mg/dL — ABNORMAL HIGH (ref 75–110)
Glucose (POC): 238 mg/dL — ABNORMAL HIGH (ref 75–110)
Glucose (POC): 153 mg/dL — ABNORMAL HIGH (ref 75–110)
Glucose (POC): 285 mg/dL — ABNORMAL HIGH (ref 75–110)
Glucose (POC): 189 mg/dL — ABNORMAL HIGH (ref 75–110)

## 2009-08-10 LAB — PROTHROMBIN TIME + INR
INR: 1.2 — ABNORMAL HIGH (ref 0.9–1.1)
Prothrombin time: 12.4 SECS — ABNORMAL HIGH (ref 9.0–11.0)

## 2009-08-10 LAB — PTT
aPTT: 66.2 s — ABNORMAL HIGH (ref 24.0–33.0)
aPTT: 78.6 s — ABNORMAL HIGH (ref 24.0–33.0)
aPTT: 56.4 s — ABNORMAL HIGH (ref 24.0–33.0)

## 2009-08-10 LAB — MAGNESIUM: Magnesium: 1.8 MG/DL (ref 1.6–2.4)

## 2009-08-10 NOTE — Procedures (Addendum)
[facility]   [address]   [place], [place] [address]      Name: Travis [name], Travis [name] Service Date: 08/09/2009  DOB: 12-20-1940 Ordered by:  MR # [id number] Location: [facility]  Sex: M Age: 69  Billing #: [id number] Date of Adm.: 08/03/2009     PULSE VOLUME RECORDING    PROCEDURE: Lower arterial pulse volume recordings.    REQUESTING PHYSICIAN: Marijo File, MD    INDICATIONS: Postop status post angioplasty of right common femoral and  femoral-popliteal.    FINDINGS: Resting ankle-brachial indices were obtained measuring 0.43 on  the right and 0.62 on the left. Toe index 0.74 on the right and 0.71 on the  left.    PVRs noted to be moderately blunted at the right thigh level.    IMPRESSION: Marked improvement from preoperative. Moderate bilateral  peripheral vascular occlusive disease persists. Aortoiliac inflow component  on the right and bilateral infrapopliteal occlusive disease.        Reviewed on 08/10/2009 8:12 AM      E-Signed By  Ross Marcus, MD 08/15/2009 13:45  Ross Marcus, MD    cc:        CDS/WMX; D: 08/09/2009 7:24 P; T: 08/10/2009 8:06 A; DOC# [id number]; JOB#  [id number]

## 2009-08-11 LAB — CBC WITH AUTOMATED DIFF
ABS. BASOPHILS: 0 10*3/uL (ref 0.0–0.1)
ABS. EOSINOPHILS: 0.1 10*3/uL (ref 0.0–0.4)
ABS. LYMPHOCYTES: 1.8 10*3/uL (ref 0.8–3.5)
ABS. MONOCYTES: 0.6 10*3/uL (ref 0.0–1.0)
ABS. NEUTROPHILS: 3.8 10*3/uL (ref 1.8–8.0)
BASOPHILS: 0 % (ref 0–1)
EOSINOPHILS: 2 % (ref 0–7)
HCT: 28 % — ABNORMAL LOW (ref 36.6–50.3)
HGB: 9.4 g/dL — ABNORMAL LOW (ref 12.1–17.0)
LYMPHOCYTES: 28 % (ref 12–49)
MCH: 29.5 PG (ref 26.0–34.0)
MCHC: 33.6 g/dL (ref 30.0–36.5)
MCV: 87.8 FL (ref 80.0–99.0)
MONOCYTES: 10 % (ref 5–13)
NEUTROPHILS: 60 % (ref 32–75)
PLATELET: 166 10*3/uL (ref 150–400)
RBC: 3.19 M/uL — ABNORMAL LOW (ref 4.10–5.70)
RDW: 15.4 % — ABNORMAL HIGH (ref 11.5–14.5)
WBC: 6.3 10*3/uL (ref 4.1–11.1)

## 2009-08-11 LAB — RENAL FUNCTION PANEL
Albumin: 2.8 g/dL — ABNORMAL LOW (ref 3.5–5.0)
Anion gap: 8 mmol/L (ref 5–15)
BUN/Creatinine ratio: 15 (ref 12–20)
BUN: 20 MG/DL (ref 6–20)
CO2: 26 MMOL/L (ref 21–32)
Calcium: 8.3 MG/DL — ABNORMAL LOW (ref 8.5–10.1)
Chloride: 104 MMOL/L (ref 97–108)
Creatinine: 1.3 MG/DL (ref 0.6–1.3)
GFR est AA: >60 mL/min/{1.73_m2} (ref >60)
GFR est non-AA: 58 mL/min/{1.73_m2} — ABNORMAL LOW (ref >60)
Glucose: 63 MG/DL — ABNORMAL LOW (ref 65–100)
Phosphorus: 2.8 MG/DL (ref 2.5–4.9)
Potassium: 3.4 MMOL/L — ABNORMAL LOW (ref 3.5–5.1)
Sodium: 138 MMOL/L (ref 136–145)

## 2009-08-11 LAB — METABOLIC PANEL, BASIC
Anion gap: 10 mmol/L (ref 5–15)
BUN/Creatinine ratio: 14 (ref 12–20)
BUN: 20 MG/DL (ref 6–20)
CO2: 25 MMOL/L (ref 21–32)
Calcium: 8 MG/DL — ABNORMAL LOW (ref 8.5–10.1)
Chloride: 102 MMOL/L (ref 97–108)
Creatinine: 1.4 MG/DL — ABNORMAL HIGH (ref 0.6–1.3)
GFR est AA: >60 mL/min/{1.73_m2} (ref >60)
GFR est non-AA: 53 mL/min/{1.73_m2} — ABNORMAL LOW (ref >60)
Glucose: 62 MG/DL — ABNORMAL LOW (ref 65–100)
Potassium: 3.4 MMOL/L — ABNORMAL LOW (ref 3.5–5.1)
Sodium: 137 MMOL/L (ref 136–145)

## 2009-08-11 LAB — PROTHROMBIN TIME + INR
INR: 1.2 — ABNORMAL HIGH (ref 0.9–1.1)
Prothrombin time: 12.1 SECS — ABNORMAL HIGH (ref 9.0–11.0)

## 2009-08-11 LAB — GLUCOSE, POC
Glucose (POC): 217 mg/dL — ABNORMAL HIGH (ref 75–110)
Glucose (POC): 64 mg/dL — ABNORMAL LOW (ref 75–110)
Glucose (POC): 68 mg/dL — ABNORMAL LOW (ref 75–110)
Glucose (POC): 227 mg/dL — ABNORMAL HIGH (ref 75–110)
Glucose (POC): 285 mg/dL — ABNORMAL HIGH (ref 75–110)
Glucose (POC): 500 mg/dL — ABNORMAL HIGH (ref 75–110)
Glucose (POC): 513 mg/dL — ABNORMAL HIGH (ref 75–110)

## 2009-08-11 LAB — PTT
aPTT: 68.8 s — ABNORMAL HIGH (ref 24.0–33.0)
aPTT: 51.8 s — ABNORMAL HIGH (ref 24.0–33.0)
aPTT: 80.4 s — ABNORMAL HIGH (ref 24.0–33.0)

## 2009-08-11 LAB — MAGNESIUM: Magnesium: 1.7 MG/DL (ref 1.6–2.4)

## 2009-08-11 NOTE — Discharge Summary (Addendum)
Name: Travis [name], Travis [name] Admitted: 08/03/2009  MR #: [id number] Discharged: 08/11/2009  Account #: [id number] DOB: May 07, 1941  Physician: Esperanza Sheets, MD Age 69     DISCHARGE SUMMARY      PRIMARY CARE PHYSICIAN: [facility].    PRIMARY VASCULAR SURGEON: Solon Augusta, MD    CONSULTS IN THE HOSPITAL: Include:  1. Davy Pique, MD, Vascular Surgery.  2. Courtney Paris, MD, Hematology.  3. H. Garner Gavel, MD, Renal.    CHIEF COMPLAINT: Chief complaint has been cough.    HISTORY OF PRESENT ILLNESS: Please see dictated H and P and consultation  notes for additional details.    Briefly, the patient is a 69 year old African American male with a history  of diabetes, hypertension, coronary disease, peripheral vascular disease  who presented to the emergency room with a cough and was admitted for  presumed bronchitis and other medical issues.    HOSPITAL COURSE  1. Bronchitis. That is now resolved. He completed a course of guaifenesin  and Levaquin, and these have been discontinued.  2. Peripheral vascular disease. He had severe restriction to his right  lower extremity. He was seen by Vascular. They did surgery. He had 2 stents  and a balloon angioplasty. They recommended Coumadin, and he will follow up  with Dr. Alvester Morin at this point.  3. Acute renal failure and chronic kidney disease, present on admission.  Mild dehydration. His creatinine came down to 1.3 after hydration. We note  a maximal creatinine on this visit of 3.3.  4. Coronary disease. He was seen by Cardiology. They signed off on this  issue. He is on beta blocker, ACE inhibitor, isosorbide mononitrate,  aspirin, Plavix, and they can follow up as an outpatient if necessary.  5. Reflux. He is on proton pump and misoprostol.  6. Hypothyroidism. He is on Synthroid.  7. Anemia. It is chronic. No active bleeding. He is on iron.  8. Diabetes type 2. He is on insulin. He has used Lantus and sliding scale.   Sugars are quite labile here in the hospital and low even we reduced his  Lantus from 40 to 12. I discussed this at length with the patient, asked  him to check his sugars frequently. He has a primary endocrinologist at the  [place] who assists in this. My suspicion is he is noncompliant at home with his  Lantus, and that explains while using Lantus here he had low sugars.  9. Hypertension. Adequate control at this point on metoprolol, hydralazine,  lisinopril, and Norvasc.  10. Hypokalemia. Very mild. We gave him p.o. potassium to replete.  11. Hyperlipidemia. We will continue Crestor.  12. Debility. Have Physical Therapy and Occupational Therapy see him at  home for any reconditioning.    PHYSICAL EXAMINATION  VITAL SIGNS: At the time of discharge, temperature is 98.6, pulse 77,  blood pressure 152/67, respirations 20, saturating 98% on room air.  GENERAL: The patient is a well-developed, adult, African American male,  sitting in bed, in no apparent distress. He appears his stated age.  HEAD: Normocephalic, atraumatic. Pupils equal, round, reactive to light.  Extraocular movements are intact. Conjunctivae are pink, normal. Sclerae  are white. Nares are patent. Oropharynx clean, moist, no lesions.  NECK: Supple. There is no lymphadenopathy. No JVD. No bruits.  LUNGS: Clear to auscultation bilaterally. No increased work of breathing.  No cough on exam.  HEART: With a regular rate and rhythm. No audible murmur, rub, or gallop.  He has  2+ pulses in his upper extremities, more trace in his lower  extremities.  ABDOMEN: Soft, nontender, nondistended. Minimal bowel sounds. No palpable  mass. No guarding. No rebound.  EXTREMITIES: Cool, dry. Range of motion and strength about 5-/5 or 4+/5.  There is no clubbing, cyanosis, or edema.  SKIN: Exam otherwise normal for age and race. No rashes or ulcers. Normal  turgor. No bleeding or bruising.  NEUROLOGIC: Cranial nerves 2 to 12 grossly intact. Deep reflexes normal.   Sensation normal.  PSYCHIATRIC: Alert and oriented x3. Normal affect. Normal insight.    DISPOSITION: He will be discharged home in stable condition.    PRIMARY DIAGNOSIS: Peripheral vascular disease.    SECONDARY DIAGNOSES  1. Coronary disease.  2. Chronic kidney disease stage 3.  3. Hyperlipidemia.  4. Hypertension.  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  6. Iron-deficiency anemia.  7. Reflux.  8. Hypothyroidism.  9. Debility.  10. Resolved hypokalemia.  11. Resolved bronchitis.    MEDICATIONS AT DISCHARGE: Will be:  1. Coumadin 5 mg p.o. once a day.  2. Terazosin 10 mg each evening.  3. Synthroid 150 mg a day.  4. Plavix 75 mg a day.  5. Aspirin 81 mg a day.  6. Misoprostol 200 mcg twice a day.  7. Famotidine 20 mg a day.  8. Bumex 2 mg a day.  9. Crestor 20 mg a day.  10. Norvasc 10 mg a day.  11. Isosorbide mononitrate 60 mg a day.  12. Lisinopril 20 mg a day.  13. Hydralazine 25 mg 3 times a day.  14. Ferrous sulfate 324 mg a day.  15. Metoprolol 100 mg twice a day.  16. Tylenol over the counter for pain control.    ACTIVITY: As tolerated. He may drive.    DIET: A heart healthy diet. Diabetic. No fluid restrictions. Do not  smoke.    FOLLOWUP: Follow up with doctors at the [facility] for usual  medical care. Dr. Alvester Morin for further vascular studies. Dr. Kizzie Bane,  cardiologist, for further cardiac issues. Follow up this coming week on  Monday or Tuesday to recheck an INR. I have discussed this with Dr.  Wesley Blas office, and they will either check the INR or arrange for  outpatient checking. PT and OT to follow the patient home for any  reconditioning.    TIME SPENT: Total time spent on discharge including discussion,  approximately 45 minutes.        Reviewed on 08/11/2009 3:14 PM          E-Signed By  Esperanza Sheets, MD 08/11/2009 18:27    Esperanza Sheets, MD    cc: Courtney Paris, M.D.   Esperanza Sheets, MD   Warnell Forester, M.D.   Gigi Gin, M.D.   Ross Marcus, MD         MM/WMX; D: 08/11/2009 12:08 P; T: 08/11/2009 2:58 P; DOC# [id number]; Job#  [id number]

## 2009-08-15 NOTE — Telephone Encounter (Signed)
Pt was d/c home on Friday,08/11/09.Pt was started on coumadin dose of 5mg  before d/c.08/12/09,INR WAS 1.2,14.0 SEC.08/14/09,GIVEN 10MG  coumadin.INR 1.5,18.0SEC.

## 2009-08-16 NOTE — Telephone Encounter (Signed)
See inr results  Coumadin Flowsheet Values Recorded Today: Date: 08/16/09  Dosage: 5MG   Sunday: 5MG   Monday: 10MG   Tuesday: 5mg   Wednesday: 5mg   Thursday: 5mg   PROTIME results: 24.9  INR results: 2.1  INR Results Ranges: 2-3  Denies missing dose or extra dose?: Yes  Bleeding or unusual bruising?: No  Blood in stool or urine?: No  Coughing up blood?: No  Alcohol Use?: No  Any change in medications?: No  Using ASA or any NSAID?: No  Epistaxis?: No  Next Check: 08/18/09

## 2009-08-21 NOTE — Telephone Encounter (Signed)
On 08/20/09,INR WAS 2.0,24.4.Coumadin dose of 5mg  given x 3days.Sunday,08/20/09,INR 2.2,26.0 SEC.Pt to take 5mg  dose of coumadin x 4 days,recheck on Thursday  Coumadin Flowsheet Values Recorded Today:

## 2009-08-25 NOTE — Telephone Encounter (Signed)
See inr results  Coumadin Flowsheet Values Recorded Today:

## 2009-08-25 NOTE — Telephone Encounter (Signed)
See inr results  Coumadin Flowsheet Values Recorded Today: Date: 08/23/09  Dosage: 5mg   Sunday: 5mg   Monday: 5mg   Tuesday: 5mg   Wednesday: 5mg   Thursday: 5mg   Friday: 5mg   Saturday: 5mg   PROTIME results: 24.5  INR results: 2.0  INR Results Ranges: 2-3  Denies missing dose or extra dose?: Yes  Bleeding or unusual bruising?: No  Blood in stool or urine?: No  Coughing up blood?: No  Alcohol Use?: No  Any change in medications?: No  Using ASA or any NSAID?: No  Epistaxis?: No  Same dose?: Yes  Change dose to: 7.5mg  on wed,then 5mg  qd  Next Check: 08/30/09

## 2009-09-04 NOTE — Telephone Encounter (Signed)
See inr results  Coumadin Flowsheet Values Recorded Today: Date: 09/04/09  Dosage: 5mg   Sunday: 5mg   Monday: 5mg   Tuesday: 5mg   Wednesday: 5mg   Thursday: 5mg   Friday: 5mg   Saturday: 5mg   PROTIME results: 26.9  INR results: 2.2  INR Results Ranges: 2-3  Denies missing dose or extra dose?: Yes  Bleeding or unusual bruising?: No  Blood in stool or urine?: No  Coughing up blood?: No  Alcohol Use?: No  Any change in medications?: No  Using ASA or any NSAID?: No  Epistaxis?: No  Same dose?: Yes  Next Check: 09/18/09  Comments: Pt to continue coumadin for 3 months.

## 2009-09-08 NOTE — Telephone Encounter (Signed)
Pt is requesting a refill on potassium. We dont have this on his list. It was discontinued at his hosp stay in March.  I have left him a message about this.

## 2009-09-14 ENCOUNTER — Encounter

## 2009-09-19 NOTE — Telephone Encounter (Signed)
See inr results  Coumadin Flowsheet Values Recorded Today: Date: 09/18/09  Dosage: 5mg   Sunday: 5mg   Monday: 5mg   Tuesday: 5mg   Wednesday: 5mg   Thursday: 5mg   Friday: 5mg   Saturday: 5mg   PROTIME results: 20.1  INR results: 1.9  INR Results Ranges: 2-3  Denies missing dose or extra dose?: Yes  Bleeding or unusual bruising?: No  Blood in stool or urine?: No  Coughing up blood?: No  Alcohol Use?: No  Any change in medications?: No  Using ASA or any NSAID?: No  Epistaxis?: No  Same dose?: Yes  Next Check: 10/11/09  Comments: Pt to take coumadin dose of 10mg  x 1,then resume to 5mg  qd

## 2009-09-20 MED ORDER — AMLODIPINE 5 MG TAB
5 mg | ORAL_TABLET | Freq: Every day | ORAL | Status: DC
Start: 2009-09-20 — End: 2009-09-20

## 2009-09-20 MED ORDER — AMLODIPINE 5 MG TAB
5 mg | ORAL_TABLET | Freq: Every day | ORAL | Status: DC
Start: 2009-09-20 — End: 2009-10-26

## 2009-09-20 NOTE — Progress Notes (Signed)
History of Present Illness:  Doing well overall, but BP has been low (SBP 80/40 sometime) - he is off the hydralazine completely.  Has been dizzy.  He has lost 20 lbs with the Bumex over the past few weeks.  No problems with chest pain or significant dyspnea.  His edema is down and the best it has been in a while.    Past Medical History   Diagnosis Date   ??? CAD (coronary artery disease)      see problem list   ??? HTN (hypertension)    ??? PVD (peripheral vascular disease)    ??? DM (diabetes mellitus)    ??? Hyperlipidemia    ??? Sleep apnea      on CPAP   ??? COPD (chronic obstructive pulmonary disease)      mild COPD, recurrent pneumonia, chronic bronchitis (followed by Dr. Leroy Libman)   ??? CKD (chronic kidney disease)      Cr 1.7 in 10/09   ??? CHF (congestive heart failure)    ??? Anemia    ??? Pulmonary hypertension      PASP 60 on echo 06/22/09       Cardiac History:   Travis [name] has known coronary artery disease dating back to May 1999 when he underwent cardiac catheterization prior to planned peripheral vascular surgery. This showed distal disease in the apical LAD and right coronary artery with a normal ejection fraction.Non q MI in 7/06.cath showed mod LAD abn RCA disease up to 50-60% and a small occluded LCX with normal LVEF. Had elevated troponin (?NSTEMI) during admission with pneumonia and ARF 05/2009 - proceeded to have a PCI (DES) to RCA. He returned for a NSTEMI (trop ~30) on 06/21/09 but cath showed stable findings.     Prior Cardiac Eval:   1) stress test 02/09/07. The type of test was a dobutamine stress perfusion imaging. Cardiac stress testing was negative for chest pain and myocardial ischemia. Myocardial perfusion imaging showed reversible defect(s) involving the inferior wall. Gated EF (%): 55.   2) echocardiogram 01/19/07. Echocardiogram demonstrated normal LV wall motion and ejection fraction, LVH and left atrial enlargement.   3) Carotid Dopplers in July 2009 showed bilateral 50-79% stenosis.    4) Carotids 07/08/08 - 50-79% stenosis bilat   5) Echo 04/03/09 - TDS, moderate LVH, LVEF 65%; moderate LAE, RAE, mild AS (mean gradient 10), mild MR   6) Echo 1/11 - mild LVH, LAE, EF 55%, mild Pulm HTN, mild MR   7) Cath 06/05/09- DES to RCA   8) cath 06/22/09 - LAD 50%, LCX 100%, mid RCA patent stent, distal RCA 40%   9) Echo 06/22/09 - mild LVH, EF 55-60%, mild LAE, mild MR/TR, PASP 60      Current outpatient prescriptions   Medication Sig Dispense Refill   ??? misoprostol (CYTOTEC) 200 mcg tablet Take 1 Tab by mouth two (2) times a day.  60 Tab  3   ??? bumetanide (BUMEX) 2 mg tablet Take 2 mg by mouth two (2) times a day.       ??? lisinopril (PRINIVIL, ZESTRIL) 20 mg tablet Take  by mouth daily.       ??? clopidogrel (PLAVIX) 75 mg tablet Take  by mouth daily.       ??? amlodipine (NORVASC) 10 mg tablet Take  by mouth daily.       ??? rosuvastatin (CRESTOR) 20 mg tablet Take 20 mg by mouth daily.       ??? terazosin (HYTRIN)  5 mg capsule Take 10 mg by mouth nightly.       ??? isosorbide mononitrate ER (IMDUR) 60 mg CR tablet Take 1 Tab by mouth every morning.  30 Tab  6   ??? aspirin 81 mg tablet Take  by mouth daily.       ??? omega-3 fatty acids-vitamin e (FISH OIL) 1,000 mg Cap Take  by mouth two (2) times a day.       ??? insulin glargine (LANTUS) 100 unit/mL injection 42 Units by SubCUTAneous route once.       ??? levothyroxine (SYNTHROID) 150 mcg tablet Take  by mouth daily (before breakfast).       ??? METOPROLOL TARTRATE PO Take 25 mg by mouth two (2) times a day.       ??? nitroglycerin (NITROSTAT) 0.4 mg SL tablet by SubLINGual route every five (5) minutes as needed.       ??? insulin aspart (NOVOLOG) 100 unit/mL injection by SubCUTAneous route.       ??? ranitidine hcl (ZANTAC) 150 mg capsule Take 150 mg by mouth two (2) times a day.         No Known Allergies    Review of Systems:   Constitutional: Negative for fever, chills, weight loss, malaise/fatigue and diaphoresis.    HEENT: Negative for nosebleeds, congestion, neck pain, tinnitus, and vision changes.   Respiratory: Negative for hemoptysis, sputum production, and wheezing.   Gastrointestinal: Negative for nausea, vomiting, abdominal pain, diarrhea, constipation, blood in stool and melena.   Genitourinary: Negative for dysuria, urgency, frequency and hematuria.   Musculoskeletal: Negative for myalgias.   Skin: Negative for rash and itching.   Heme: Does not bleed or bruise easily.   Neurological: Negative for speech change and focal weakness.     Physical Exam:   BP 100/56   Pulse 72   Wt 185 lb (83.915 kg)   Patient appears generally well, mood and affect are appropriate and pleasant.   HEENT: Normocephalic, atraumatic.   Neck Exam: Supple, Left carotid bruit.   Lung Exam: Clear to auscultation, even breath sounds.   Cardiac Exam: Regular rate and rhythm with 2/6 systolic murmur. No rub or S3.   Abdomen: Soft, non-tender, normal bowel sounds. No bruits or masses.   Extremities: trace lower extremity edema  Vascular: difficult to palpate dorsalis pedis pulses bilaterally.     Assessment and Plan:  1) HTN - will decrease Norvasc to 5 mg daily due to recent hypotension.  Will stop Norvasc altogether if needed.  2) Will need to review recent BMP labs to see what dose K supplementation he needs as well as FU renal function.  Continue with the Bumex  3) CAD and PAD - stable symptoms  4) RTC 3 months      Tharon Aquas, MD, [facility]

## 2009-09-29 ENCOUNTER — Inpatient Hospital Stay
Admit: 2009-09-29 | Discharge: 2009-10-15 | Disposition: A | Discharge status: Other Acute Inpatient Hospital | Payer: MEDICARE | Attending: Internal Medicine | Admitting: Internal Medicine

## 2009-09-29 DIAGNOSIS — N179 Acute kidney failure, unspecified: Secondary | ICD-10-CM

## 2009-09-29 LAB — CBC WITH AUTOMATED DIFF
ABS. BASOPHILS: 0 10*3/uL (ref 0.0–0.1)
ABS. EOSINOPHILS: 0 10*3/uL (ref 0.0–0.4)
ABS. LYMPHOCYTES: 1.1 10*3/uL (ref 0.8–3.5)
ABS. MONOCYTES: 0.8 10*3/uL (ref 0.0–1.0)
ABS. NEUTROPHILS: 8.5 10*3/uL — ABNORMAL HIGH (ref 1.8–8.0)
BASOPHILS: 0 % (ref 0–1)
EOSINOPHILS: 0 % (ref 0–7)
HCT: 27.2 % — ABNORMAL LOW (ref 36.6–50.3)
HGB: 8.5 g/dL — ABNORMAL LOW (ref 12.1–17.0)
LYMPHOCYTES: 11 % — ABNORMAL LOW (ref 12–49)
MCH: 29.5 PG (ref 26.0–34.0)
MCHC: 31.3 g/dL (ref 30.0–36.5)
MCV: 94.4 FL (ref 80.0–99.0)
MONOCYTES: 8 % (ref 5–13)
NEUTROPHILS: 81 % — ABNORMAL HIGH (ref 32–75)
PLATELET: 168 10*3/uL (ref 150–400)
RBC: 2.88 M/uL — ABNORMAL LOW (ref 4.10–5.70)
RDW: 16.7 % — ABNORMAL HIGH (ref 11.5–14.5)
WBC: 10.4 10*3/uL (ref 4.1–11.1)

## 2009-09-29 LAB — POC CHEM8
Anion gap (POC): 26 — ABNORMAL HIGH (ref 5–15)
BUN (POC): 102 MG/DL — ABNORMAL HIGH (ref 9–20)
CO2 (POC): 16 MMOL/L — ABNORMAL LOW (ref 21–32)
Calcium, ionized (POC): 0.93 MMOL/L — ABNORMAL LOW (ref 1.12–1.32)
Chloride (POC): 87 MMOL/L — ABNORMAL LOW (ref 98–107)
Creatinine (POC): 3.1 MG/DL — ABNORMAL HIGH (ref 0.6–1.3)
GFRAA, POC: 26 mL/min/{1.73_m2} — ABNORMAL LOW (ref >60)
GFRNA, POC: 21 mL/min/{1.73_m2} — ABNORMAL LOW (ref >60)
Glucose (POC): >700 MG/DL — CR (ref 75–110)
Hematocrit (POC): 28 % — ABNORMAL LOW (ref 36.6–50.3)
Hemoglobin (POC): 9.5 GM/DL — ABNORMAL LOW (ref 12.1–17.0)
Potassium (POC): 6 MMOL/L — ABNORMAL HIGH (ref 3.5–5.1)
Sodium (POC): 122 MMOL/L — CL (ref 137–145)

## 2009-09-29 LAB — URINALYSIS W/ REFLEX CULTURE
Bacteria: NEGATIVE /HPF
Bilirubin: NEGATIVE
Blood: NEGATIVE
Glucose: >1000 MG/DL — AB
Leukocyte Esterase: NEGATIVE
Nitrites: NEGATIVE
Protein: NEGATIVE MG/DL
Specific gravity: 1.02 (ref 1.003–1.030)
Urobilinogen: 0.2 EU/DL (ref 0.2–1.0)
pH (UA): 5 (ref 5.0–8.0)

## 2009-09-29 LAB — HEPATIC FUNCTION PANEL
A-G Ratio: 1 — ABNORMAL LOW (ref 1.1–2.2)
ALT (SGPT): 28 U/L (ref 12–78)
AST (SGOT): 28 U/L (ref 15–37)
Albumin: 3.8 g/dL (ref 3.5–5.0)
Alk. phosphatase: 91 U/L (ref 50–136)
Bilirubin, direct: 0.2 MG/DL (ref 0.0–0.2)
Bilirubin, total: 0.6 MG/DL (ref 0.2–1.0)
Globulin: 3.9 g/dL (ref 2.0–4.0)
Protein, total: 7.7 g/dL (ref 6.4–8.2)

## 2009-09-29 LAB — CK W/ CKMB & INDEX
CK - MB: 13.9 NG/ML — ABNORMAL HIGH (ref 0.5–3.6)
CK-MB Index: 7.1 — ABNORMAL HIGH (ref 0–2.5)
CK: 197 U/L (ref 39–308)

## 2009-09-29 LAB — POC TROPONIN-I: Troponin-I (POC): 0.14 ng/mL — ABNORMAL HIGH (ref 0.00–0.08)

## 2009-09-29 LAB — VENOUS BLOOD PH: VENOUS PH: 7.29 — CL (ref 7.32–7.42)

## 2009-09-29 LAB — NT-PRO BNP: NT pro-BNP: 2981 PG/ML — ABNORMAL HIGH (ref 0–125)

## 2009-09-29 MED ORDER — OXYCODONE-ACETAMINOPHEN 5 MG-325 MG TAB
5-325 mg | Freq: Four times a day (QID) | ORAL | Status: DC | PRN
Start: 2009-09-29 — End: 2009-10-15

## 2009-09-29 MED ORDER — SODIUM CHLORIDE 0.9% BOLUS IV
0.9 % | Freq: Once | INTRAVENOUS | Status: AC
Start: 2009-09-29 — End: 2009-09-29
  Administered 2009-09-29: 22:00:00 via INTRAVENOUS

## 2009-09-29 MED ORDER — MORPHINE 2 MG/ML INJECTION
2 mg/mL | INTRAMUSCULAR | Status: DC | PRN
Start: 2009-09-29 — End: 2009-10-15

## 2009-09-29 MED ORDER — INSULIN REGULAR HUMAN 100 UNIT/ML INJECTION
100 unit/mL | INTRAMUSCULAR | Status: AC
Start: 2009-09-29 — End: 2009-09-29
  Administered 2009-09-29: 21:00:00 via INTRAVENOUS

## 2009-09-29 MED ORDER — NITROGLYCERIN 2 % TRANSDERMAL OINTMENT
2 % | Freq: Two times a day (BID) | TRANSDERMAL | Status: DC
Start: 2009-09-29 — End: 2009-10-01
  Administered 2009-09-29 – 2009-10-01 (×4): via TOPICAL

## 2009-09-29 MED ORDER — SODIUM CHLORIDE 0.9% BOLUS IV
0.9 % | Freq: Once | INTRAVENOUS | Status: AC
Start: 2009-09-29 — End: 2009-09-29
  Administered 2009-09-29: 21:00:00 via INTRAVENOUS

## 2009-09-29 MED ORDER — SODIUM CHLORIDE 0.9 % IV
100 unit/mL | INTRAVENOUS | Status: DC
Start: 2009-09-29 — End: 2009-09-30
  Administered 2009-09-29 – 2009-09-30 (×12): via INTRAVENOUS

## 2009-09-29 MED ORDER — MORPHINE 4 MG/ML SYRINGE
4 mg/mL | INTRAMUSCULAR | Status: AC
Start: 2009-09-29 — End: 2009-09-29
  Administered 2009-09-29: 22:00:00 via INTRAVENOUS

## 2009-09-29 MED FILL — INSULIN REGULAR HUMAN 100 UNIT/ML INJECTION: 100 unit/mL | INTRAMUSCULAR | Qty: 10

## 2009-09-29 MED FILL — MORPHINE 4 MG/ML SYRINGE: 4 mg/mL | INTRAMUSCULAR | Qty: 1

## 2009-09-29 MED FILL — INSULIN REGULAR HUMAN 100 UNIT/ML INJECTION: 100 unit/mL | INTRAMUSCULAR | Qty: 1

## 2009-09-29 MED FILL — SODIUM CHLORIDE 0.9 % IV: INTRAVENOUS | Qty: 1000

## 2009-09-29 MED FILL — NITRO-BID 2 % TRANSDERMAL OINTMENT: 2 % | TRANSDERMAL | Qty: 1

## 2009-09-29 NOTE — ED Notes (Signed)
Chest pain, back pain and vomiting, started yesterday, worse today.

## 2009-09-29 NOTE — ED Notes (Signed)
Patient stated he took his aspirin today prior to coming to ER. Md Acknowledge statement by patient also in room along with RN I Derrell Lolling

## 2009-09-29 NOTE — ED Notes (Signed)
Critical Result Notification    Received and verbally repeated the following test results Glucose, 1092 at 1600 from Randy (NAME OF TECHNICIAN).     Dr. Patrcia Dolly (NAME OF PROVIDER) was notified and provided a verbal readback of the results listed above on 09/29/09(DATE) at 2106(TIME). Orders were received at this time.    Additional comments: glucostabilizer started at this time.    SARA Sharlette Dense, RN

## 2009-09-29 NOTE — Consults (Signed)
Cardiology Consultation    Consultation date & time: 09/29/2009 7:21 PM     Reason for consultation:  Travis [name] is a 69 y.o. male who is kindly referred by Dr Mort Sawyers  for evaluation of chest pain, abnormal Troponin.    Impression:  Chest pain very unlikely cardiac (prolonged with essentially negative troponin; favorable recent cath findings)-- ? Etiology.  Mild troponin adn CKMB elevation, nonspecific given CRF.  CAD s/p mult procedures as below, seeming stable at present, AV pacer.  Nausea, emesis, hyperglycemia.  ARF on CRF, with hyperkalemia, presumably from osmotic diuresis and poor intake.    Suggestions:   Get follow-up troponins to verify they remain insignificant. Replete volume, follow BMP.  Continue lopressor, imdur, norvasc, asa, plavix; hold prior prinivil and bumex and kcl for now.  No CAD testing seems necessary at present.      History of present illness:   The following cardiac history is from a very recent office note (Dr Park Breed):  Travis [name] has known coronary artery disease dating back to May 1999 when he underwent cardiac catheterization prior to planned peripheral vascular surgery. This showed distal disease in the apical LAD and right coronary artery with a normal ejection fraction.Non q MI in 7/06.cath showed mod LAD abn RCA disease up to 50-60% and a small occluded LCX with normal LVEF. Had elevated troponin (?NSTEMI) during admission with pneumonia and ARF 05/2009 - proceeded to have a PCI (DES) to RCA. He returned for a NSTEMI (trop ~30) on 06/21/09 but cath showed stable findings.    Carotids 07/08/08 - 50-79% stenosis bilat    Cath 06/05/09- DES to RCA    cath 06/22/09 - LAD 50%, LCX 100%, mid RCA patent stent, distal RCA 40%    Echo 06/22/09 - mild LVH, EF 55-60%, mild LAE, mild MR/TR, PASP 60   He had been doing well recently but comes now having had many hours of anterior chest pain, also back pain, constant since yesterday, prompting current ER visit. Improved, possibly now resolved. No dyspnea, non-pleuritic. Had stopped insulin due to anorexia and vomiting. Now being admitted for hyperglycemia.      Past Medical History   Diagnosis Date   ??? CAD (coronary artery disease)      see problem list   ??? HTN (hypertension)    ??? PVD (peripheral vascular disease)    ??? DM (diabetes mellitus)    ??? Hyperlipidemia    ??? Sleep apnea      on CPAP   ??? COPD (chronic obstructive pulmonary disease)      mild COPD, recurrent pneumonia, chronic bronchitis (followed by Dr. Leroy Libman)   ??? CKD (chronic kidney disease)      Cr 1.7 in 10/09   ??? CHF (congestive heart failure)    ??? Anemia    ??? Pulmonary hypertension      PASP 60 on echo 06/22/09        Past Surgical History   Procedure Date   ??? Hx orthopaedic      knee replacement   ??? Vascular surgery procedure unlist      arterial bypass   ??? Hx pacemaker    ??? Cardiac surg procedure unlist      cardiac stent       Family History   Problem Relation Age of Onset   ??? Hypertension            Primary care physician:  Jeral Fruit, MD     Medications  before admission:  Previous Medications    AMLODIPINE (NORVASC) 5 MG TABLET    Take 1 Tab by mouth daily.    ASPIRIN 81 MG TABLET    Take  by mouth daily.    BUMETANIDE (BUMEX) 2 MG TABLET    Take 2 mg by mouth two (2) times a day.    CLOPIDOGREL (PLAVIX) 75 MG TABLET    Take  by mouth daily.    INSULIN ASPART (NOVOLOG) 100 UNIT/ML INJECTION    by SubCUTAneous route.    INSULIN GLARGINE (LANTUS) 100 UNIT/ML INJECTION    30 Units by SubCUTAneous route once.    ISOSORBIDE MONONITRATE ER (IMDUR) 60 MG CR TABLET    Take 1 Tab by mouth every morning.    LEVOTHYROXINE (SYNTHROID) 150 MCG TABLET    Take  by mouth daily (before breakfast).    LISINOPRIL (PRINIVIL, ZESTRIL) 20 MG TABLET    Take  by mouth daily.     METOPROLOL TARTRATE PO    Take 25 mg by mouth two (2) times a day.    MISOPROSTOL (CYTOTEC) 200 MCG TABLET    Take 1 Tab by mouth two (2) times a day.    NITROGLYCERIN (NITROSTAT) 0.4 MG SL TABLET    by SubLINGual route every five (5) minutes as needed.    OMEGA-3 FATTY ACIDS-VITAMIN E (FISH OIL) 1,000 MG CAP    Take  by mouth two (2) times a day.    POTASSIUM CHLORIDE SA (MICRO-K) 10 MEQ CAPSULE    Take 10 mEq by mouth daily.    RANITIDINE HCL (ZANTAC) 150 MG CAPSULE    Take 150 mg by mouth two (2) times a day.    ROSUVASTATIN (CRESTOR) 20 MG TABLET    Take 20 mg by mouth daily.    TERAZOSIN (HYTRIN) 5 MG CAPSULE    Take 10 mg by mouth nightly.          Review of systems:  Difficult to obtain. Patient seems a little lethargic.  All other systems reviewed and are negative except as above.    Physical Exam:  Visit Vitals   Item Reading   ??? BP 113/43   ??? Pulse 99   ??? Temp 97.7 ??F (36.5 ??C)   ??? Resp 25   ??? Ht 5\' 6"  (1.676 m)   ??? Wt 166 lb (75.297 kg)   ??? BMI 26.79 kg/m2   ??? SpO2 95%          Appearance: uncomfortable, can't specify why    Cardiovascular: pulses 2+ popliteal and above. Heart sounds normal with loud harsh systolic murmur -- possible rub ??    Lungs: clear    Abdomen: nontender    Extremities: no edema      Laboratory and Imaging have been reviewed and are notable for:    EKG: NSR, a-sensed, v-paced    X Ray: clear    Blood work: BUN, CR, K, BNP all high. Troponin and CK MB slightly high

## 2009-09-29 NOTE — ED Notes (Signed)
Discussed with Dr. Servando Snare will see in consult

## 2009-09-29 NOTE — ED Notes (Signed)
Spoke to supervisor in order to try to get pt. Up to room faster. Stated will look into it and call back. Notified charge nurse.

## 2009-09-29 NOTE — ED Notes (Signed)
Informed Dr. Mort Sawyers about accucheck reading "HI", sent specimen to lab. Dr. Mort Sawyers stated to leave insulin drip at same rate.

## 2009-09-29 NOTE — ED Notes (Signed)
Called ICU to attempt to call report again, stated that nurse taking that pt. Is not coming in until midnight and the transfer pt. Has not left yet. Will continue to call. Notified charge nurse.

## 2009-09-29 NOTE — ED Notes (Signed)
Discussed with Dr. Drucilla Schmidt for admission.

## 2009-09-29 NOTE — ED Notes (Signed)
Previous nurse attempted to call report at 1920, ICU stated nurse is not there yet to take report and also had a code. Will call back when ready for report per Marko Stai RN.

## 2009-09-29 NOTE — ED Notes (Signed)
Bedside shift change report given to sarah c (oncoming nurse) by Marko Stai (offgoing nurse).  Report given with SBAR.

## 2009-09-29 NOTE — ED Provider Notes (Signed)
Patient is a 69 y.o. male presenting with chest pain and back pain. The history is provided by the patient.   Chest Pain   This is a recurrent problem. The current episode started yesterday. The problem has not changed since onset. The problem occurs constantly (waxing and waning). The pain is associated with normal activity. The pain is present in the substernal region. The pain is at a severity of 6/10. The quality of the pain is described as pressure-like and dull. The pain radiates to the mid back. Associated symptoms include malaise/fatigue, nausea, vomiting, back pain, weakness and cough. Pertinent negatives include no diaphoresis, no fever, no numbness, no palpitations, no abdominal pain, no headaches, no leg pain, no lower extremity edema, no hemoptysis, no shortness of breath and no sputum production. He has tried nothing for the symptoms. Risk factors include family history, cardiac disease, hypertension and male gender. His past medical history does not include aneurysm, DVT or PE. Procedural history includes cardiac catheterization and pacemaker.Pertinent negatives include no CABG.patient had MI last month and had cath done but does not know results. or if any stents were placed   Back Pain   Associated symptoms include chest pain and weakness. Pertinent negatives include no fever, no numbness, no headaches, no abdominal pain, no dysuria and no leg pain.        Past Medical History   Diagnosis Date   ??? CAD (coronary artery disease)      see problem list   ??? HTN (hypertension)    ??? PVD (peripheral vascular disease)    ??? DM (diabetes mellitus)    ??? Hyperlipidemia    ??? Sleep apnea      on CPAP   ??? COPD (chronic obstructive pulmonary disease)      mild COPD, recurrent pneumonia, chronic bronchitis (followed by Dr. Leroy Libman)   ??? CKD (chronic kidney disease)      Cr 1.7 in 10/09   ??? CHF (congestive heart failure)    ??? Anemia    ??? Pulmonary hypertension      PASP 60 on echo 06/22/09          Past Surgical History    Procedure Date   ??? Hx orthopaedic      knee replacement   ??? Vascular surgery procedure unlist      arterial bypass   ??? Hx pacemaker    ??? Cardiac surg procedure unlist      cardiac stent           No family history on file.     History   Social History   ??? Marital Status: Single     Spouse Name: N/A     Number of Children: N/A   ??? Years of Education: N/A   Occupational History   ??? Not on file.   Social History Main Topics   ??? Smoking status: Former Smoker -- 20 years     Types: Cigarettes   ??? Smokeless tobacco: Not on file   ??? Alcohol Use: No   ??? Drug Use: Not on file   ??? Sexually Active: Not on file   Other Topics Concern   ??? Not on file   Social History Narrative   ??? No narrative on file                    ALLERGIES: Review of patient's allergies indicates no known allergies.      Review of Systems   Constitutional: Positive for  malaise/fatigue and fatigue. Negative for fever, diaphoresis, appetite change and unexpected weight change.   HENT: Negative for facial swelling, rhinorrhea, trouble swallowing, neck pain, dental problem and ear discharge.    Eyes: Negative for pain and discharge.   Respiratory: Positive for cough and chest tightness. Negative for apnea, hemoptysis, sputum production, shortness of breath, wheezing and stridor.    Cardiovascular: Positive for chest pain. Negative for palpitations and leg swelling.   Gastrointestinal: Positive for nausea and vomiting. Negative for abdominal pain, diarrhea, blood in stool and abdominal distention.   Genitourinary: Negative for dysuria, hematuria, flank pain and difficulty urinating.   Musculoskeletal: Positive for back pain. Negative for myalgias, joint swelling and arthralgias.   Skin: Negative for rash, color change and wound.   Neurological: Positive for weakness. Negative for facial asymmetry, speech difficulty, numbness and headaches.   Hematological: Negative for adenopathy.    Psychiatric/Behavioral: Negative for suicidal ideas, hallucinations, behavioral problems, self-injury and agitation.       Filed Vitals:    09/29/09 1544   BP: 115/40   Pulse: 94   Temp: 97.7 ??F (36.5 ??C)   Resp: 18   Height: 5\' 6"  (1.676 m)   Weight: 166 lb (75.297 kg)   SpO2: 97%              Physical Exam   Nursing note and vitals reviewed.  Constitutional: He is oriented to person, place, and time. He appears well-developed and well-nourished. He appears distressed.   HENT:   Head: Normocephalic and atraumatic.   Right Ear: External ear normal.   Left Ear: External ear normal.   Mouth/Throat: No oropharyngeal exudate.        Dry MMM   Eyes: Conjunctivae and extraocular motions are normal. Pupils are equal, round, and reactive to light. Right eye exhibits no discharge. Left eye exhibits no discharge. No scleral icterus.   Neck: Normal range of motion. Neck supple. No JVD present. No tracheal deviation present. No thyromegaly present.   Cardiovascular: Normal rate, regular rhythm and intact distal pulses.    Murmur heard.       Grade 2/6 SEM, question S4   Pulmonary/Chest: Effort normal and breath sounds normal. No respiratory distress. He has no wheezes. He has no rales.   Abdominal: Soft. He exhibits no distension. No tenderness. He has no rebound and no guarding.   Musculoskeletal: Normal range of motion. He exhibits no edema and no tenderness.   Lymphadenopathy:     He has no cervical adenopathy.   Neurological: He is alert and oriented to person, place, and time. No cranial nerve deficit. Coordination normal.   Skin: Skin is warm. No rash noted. No erythema.   Psychiatric: He has a normal mood and affect. His behavior is normal. Judgment and thought content normal.        MDM    Procedures    ED EKG interpretation:   Rhythm: paced; and regular . Rate (approx.): 92; Axis: normal; P wave: none; QRS interval: prolonged; ST/T wave: secobndary ST changes; in  Lead: *inferolateral**; Other findings: abnormal ekg. This EKG was interpreted by Sunnie Nielsen. Mort Sawyers, DO,ED Provider.

## 2009-09-29 NOTE — Progress Notes (Signed)
TRANSFER - OUT REPORT:    Verbal report given to Thayer Ohm (name) on Travis [name]  being transferred to ICU (unit) for routine progression of care       Report consisted of patient???s Situation, Background, Assessment and   Recommendations(SBAR).     Information from the following report(s) SBAR, ED Summary and MAR was reviewed with the receiving nurse.    Opportunity for questions and clarification was provided.

## 2009-09-29 NOTE — ED Notes (Signed)
Critical Result Notification    Received and verbally repeated the following test results Glucose, 941from Randy (NAME OF TECHNICIAN).     Dr. Devra Dopp OF PROVIDER) was notified and provided a verbal readback of the results listed above on 09-27-09(DATE) at 2025(TIME). Orders were not received at this time.    Additional comments: SARA CHAPLIN, RN NOTIFIED.    Chipper Herb, RN

## 2009-09-29 NOTE — ED Notes (Signed)
Informed Dr. Patrcia Dolly of pt. Glucose, requested order for glucostabilizer.

## 2009-09-29 NOTE — ED Notes (Signed)
Pt. Given ice water.

## 2009-09-29 NOTE — Progress Notes (Signed)
Pharmacy renally adjusted famotidine, which is a formulary substitution for ranitidine.  Will follow daily.

## 2009-09-29 NOTE — ED Notes (Signed)
Critical Result Notification troponin    Received and verbally repeated the following test results  Troponin 1.75 from Randy (NAME OF TECHNICIAN).     Dr. Devra Dopp OF PROVIDER) 2049  Additional comments:none    SARA Sharlette Dense, RN

## 2009-09-29 NOTE — H&P (Signed)
[facility]  [address] [place], [place]  [address]  [phone]    Admission History and Physical      NAME:  Travis [name]   DOB:   03-05-1941   MRN:  [id number]     PCP:  Jeral Fruit, MD     Date/Time:  09/29/2009         Subjective:     CHIEF COMPLAINT: back and chest pain     HISTORY OF PRESENT ILLNESS:     Patient is a 69 yo AAM hx of CAD, CKD 3, AV block with pacer, DM, COPD, HTN, who presented to the ED with chest pain and back pain x2 days.  The patient stated that his back pain is located at the left shoulder blade, sharp, 8/10, worse with movement.  Chest pain is midsternal, sharp, not associated with SOB, Nausea, vomiting, or diaphoresis.  Due to the pain, the patient has stopped his insulin for the past 2 days (normally takes Lantus 30u and SSI novolog).  In the ED, found to have a glucose of 700 with AG of 26.  Also had a Trop of 0.14.  Per computer record, the patient has had 2 heart cath this past year, which showed diffused CAD.  In the ED, insulin gtt was started.  Cardiology was consulted for elevated Trop.      No Known Allergies    Prior to Admission medications    Medication Sig Start Date End Date Taking? Authorizing Provider   insulin glargine (LANTUS) 100 unit/mL injection 30 Units by SubCUTAneous route once.    Phys Other, MD   potassium chloride SA (MICRO-K) 10 mEq capsule Take 10 mEq by mouth daily. 09/29/09   Phys Other, MD   amlodipine (NORVASC) 5 mg tablet Take 1 Tab by mouth daily. 09/20/09   Laddie Aquas, MD   misoprostol (CYTOTEC) 200 mcg tablet Take 1 Tab by mouth two (2) times a day. 07/24/09   Laddie Aquas, MD   bumetanide (BUMEX) 2 mg tablet Take 2 mg by mouth two (2) times a day. 06/26/09   Historical Provider   lisinopril (PRINIVIL, ZESTRIL) 20 mg tablet Take  by mouth daily.    Historical Provider   clopidogrel (PLAVIX) 75 mg tablet Take  by mouth daily.    Historical Provider    rosuvastatin (CRESTOR) 20 mg tablet Take 20 mg by mouth daily.    Historical Provider   terazosin (HYTRIN) 5 mg capsule Take 10 mg by mouth nightly.    Historical Provider   isosorbide mononitrate ER (IMDUR) 60 mg CR tablet Take 1 Tab by mouth every morning. 02/23/09   Carmelia Roller III, MD   aspirin 81 mg tablet Take  by mouth daily.    Historical Provider   omega-3 fatty acids-vitamin e (FISH OIL) 1,000 mg Cap Take  by mouth two (2) times a day.    Historical Provider   levothyroxine (SYNTHROID) 150 mcg tablet Take  by mouth daily (before breakfast).    Historical Provider   METOPROLOL TARTRATE PO Take 25 mg by mouth two (2) times a day.    Historical Provider   nitroglycerin (NITROSTAT) 0.4 mg SL tablet by SubLINGual route every five (5) minutes as needed.    Historical Provider   insulin aspart (NOVOLOG) 100 unit/mL injection by SubCUTAneous route.    Historical Provider   ranitidine hcl (ZANTAC) 150 mg capsule Take 150 mg by mouth two (2) times  a day.    Historical Provider         Past Medical History   Diagnosis Date   ??? CAD (coronary artery disease)      see problem list   ??? HTN (hypertension)    ??? PVD (peripheral vascular disease)    ??? DM (diabetes mellitus)    ??? Hyperlipidemia    ??? Sleep apnea      on CPAP   ??? COPD (chronic obstructive pulmonary disease)      mild COPD, recurrent pneumonia, chronic bronchitis (followed by Dr. Leroy Libman)   ??? CKD (chronic kidney disease)      Cr 1.7 in 10/09   ??? CHF (congestive heart failure)    ??? Anemia    ??? Pulmonary hypertension      PASP 60 on echo 06/22/09          Past Surgical History   Procedure Date   ??? Hx orthopaedic      knee replacement   ??? Vascular surgery procedure unlist      arterial bypass   ??? Hx pacemaker    ??? Cardiac surg procedure unlist      cardiac stent         History   Substance Use Topics   ??? Smoking status: Former Smoker -- 20 years     Types: Cigarettes   ??? Smokeless tobacco: Not on file   ??? Alcohol Use: No          Family History    Problem Relation Age of Onset   ??? Hypertension            Review of Systems:  (bold if positive, if negative)    Gen:  Eyes:  ENT:  CVS:  chest pain,Pulm:  GI:    GU:    MS:  Pain, Skin:  Psych:  Endo:    Hem:  Renal:    Neuro:          Objective:      VITALS:    Vital signs reviewed; most recent are:    Visit Vitals   Item Reading   ??? BP 115/40   ??? Pulse 94   ??? Temp 97.7 ??F (36.5 ??C)   ??? Resp 18   ??? Ht 5\' 6"    ??? Wt 166 lb   ??? BMI 26.79 kg/m2   ??? SpO2 99%       SpO2 Readings from Last 6 Encounters:   09/29/09 99%   03/31/09 94%          No intake or output data in the 24 hours ending 09/29/09 1838     Exam:     Physical Exam:    Gen:  Well-developed, well-nourished, in mod distress  HEENT:  Pink conjunctivae, PERRL, hearing intact to voice, dry mucous membranes  Neck:  Supple, without masses, thyroid non-tender  Resp:  No accessory muscle use, clear breath sounds without wheezes rales or rhonchi  Card:  2/6 SEM, normal S1, S2 without thrills, bruits or peripheral edema  Abd:  Soft, non-tender, non-distended, normoactive bowel sounds are present, no palpable organomegaly and no detectable hernias  Lymph:  No cervical adenopathy  Musc:  Back pain to palpation of left shoulder blade  Skin:  No rashes or ulcers, skin turgor is good  Neuro:  Cranial nerves 3-12 are grossly intact, follows commands appropriately  Psych:  Alert with good insight.  Oriented to person, place, and time  Labs:    Recent Labs   Basename 09/29/09 1600   ??? WBC 10.4   ??? HGB 8.5*   ??? HCT 27.2*   ??? PLT 168       Recent Labs   Basename 09/29/09 1600   ??? NA --   ??? K --   ??? CL --   ??? CO2 --   ??? GLU --   ??? BUN --   ??? CREA --   ??? CA --   ??? MG --   ??? PHOS --   ??? ALB 3.8   ??? TBIL 0.6   ??? SGOT 28       Lab Results   Component Value Date/Time    POC GLUCOSE 513 08/11/2009  5:15 PM    POC GLUCOSE 500 08/11/2009  5:13 PM         No results found for this basename: PH:1,PCO2:1,PO2:1,HCO3:1,FIO2:1 in the last 72 hours     No results found for this basename: INR:1 in the last 72 hours    Chest Xray and EKG reviewed:   Chest X-ray: Normal.Results reviewed with Radiologist.       Assessment/Plan:       Patient Active Hospital Problem List:    69 yo AAM hx of CAD, DM, CKD 3, AV block with pacer, who presented with chest and back pain.  Also found to be in DKA and has mildly elevated Trop    1) DKA (diabetic ketoacidosis): due to medical non-compliance.  Stopped insulin x2 days PTA for "feeling bad." Will monitor in ICU, start IVF, insulin gtt, monitor electrolytes.  CXR and U/A neg for infection.      2) CAD/Elevated Trop:  Likely heart strain from DKA.  Seems to have chronic chest pain, could be chronic angina.  Cardiology consulted.  S/p cath x2 this year, which showed preserved EF but diffused CAD.  Cont. ASA, plavix, statin, BB.  Monitor Trop    3) Acute kidney failure: due to DKA and dehydration. Has CKD 3.  Hold bumex and lisinopril.  Will Cont. IVF, monitor    4) Chronic kidney disease, stage III (moderate): baseline Cr from 1.5 to 2.5.  Cont. To monitor    5) Hyperkalemia (09/29/2009): from acidosis.  Hopefully will improve with insulin gtt    6) Hyposmolality and/or hyponatremia (09/29/2009): due to hyperglycemia and hypovolemia.  Cont. To monitor, cont IVF    7) Anemia, unspecified (09/29/2009): chronic, baseline Hgb 8-10.  Monitor    8) Chest pain, unspecified (09/29/2009): likely has chronic angina.  S/p multiple cath and cardiology evaluations.  Cont nitro, ASA, plavix, BB, statin.  Cardiology following.     9) Back pain (09/29/2009): likely MSK.  Pain control prn    Full Code     Risk of deterioration: high      Total time spent with patient: 34 Minutes                  Care Plan discussed with: Patient    Discussed:  Care Plan    Prophylaxis:  Hep SQ    Probable Disposition:  Home w/Family           ___________________________________________________    Attending Physician: Twanna Hy. Jeilyn Reznik, MD

## 2009-09-29 NOTE — ED Notes (Signed)
Patient took his regular ASA today

## 2009-09-30 LAB — METABOLIC PANEL, COMPREHENSIVE
A-G Ratio: 0.8 — ABNORMAL LOW (ref 1.1–2.2)
A-G Ratio: 0.9 — ABNORMAL LOW (ref 1.1–2.2)
ALT (SGPT): 29 U/L (ref 12–78)
ALT (SGPT): 30 U/L (ref 12–78)
AST (SGOT): 31 U/L (ref 15–37)
AST (SGOT): 70 U/L — ABNORMAL HIGH (ref 15–37)
Albumin: 3.5 g/dL (ref 3.5–5.0)
Albumin: 3.6 g/dL (ref 3.5–5.0)
Alk. phosphatase: 88 U/L (ref 50–136)
Alk. phosphatase: 91 U/L (ref 50–136)
Anion gap: 15 mmol/L (ref 5–15)
Anion gap: 31 mmol/L — ABNORMAL HIGH (ref 5–15)
BUN/Creatinine ratio: 25 — ABNORMAL HIGH (ref 12–20)
BUN/Creatinine ratio: 28 — ABNORMAL HIGH (ref 12–20)
BUN: 102 MG/DL — ABNORMAL HIGH (ref 6–20)
BUN: 99 MG/DL — ABNORMAL HIGH (ref 6–20)
Bilirubin, total: 0.4 MG/DL (ref 0.2–1.0)
Bilirubin, total: 0.6 MG/DL (ref 0.2–1.0)
CO2: 17 MMOL/L — ABNORMAL LOW (ref 21–32)
CO2: 26 MMOL/L (ref 21–32)
Calcium: 8.3 MG/DL — ABNORMAL LOW (ref 8.5–10.1)
Calcium: 8.5 MG/DL (ref 8.5–10.1)
Chloride: 79 MMOL/L — ABNORMAL LOW (ref 97–108)
Chloride: 87 MMOL/L — ABNORMAL LOW (ref 97–108)
Creatinine: 3.6 MG/DL — ABNORMAL HIGH (ref 0.6–1.3)
Creatinine: 3.9 MG/DL — ABNORMAL HIGH (ref 0.6–1.3)
GFR est AA: 20 mL/min/{1.73_m2} — ABNORMAL LOW (ref >60)
GFR est AA: 22 mL/min/{1.73_m2} — ABNORMAL LOW (ref >60)
GFR est non-AA: 16 mL/min/{1.73_m2} — ABNORMAL LOW (ref >60)
GFR est non-AA: 18 mL/min/{1.73_m2} — ABNORMAL LOW (ref >60)
Globulin: 4.2 g/dL — ABNORMAL HIGH (ref 2.0–4.0)
Globulin: 4.4 g/dL — ABNORMAL HIGH (ref 2.0–4.0)
Glucose: 728 MG/DL — CR (ref 65–100)
Glucose: 985 MG/DL — CR (ref 65–100)
Potassium: 4.1 MMOL/L (ref 3.5–5.1)
Potassium: 4.7 MMOL/L (ref 3.5–5.1)
Protein, total: 7.8 g/dL (ref 6.4–8.2)
Protein, total: 7.9 g/dL (ref 6.4–8.2)
Sodium: 127 MMOL/L — ABNORMAL LOW (ref 136–145)
Sodium: 128 MMOL/L — ABNORMAL LOW (ref 136–145)

## 2009-09-30 LAB — PTT
aPTT: 31.3 s (ref 24.0–33.0)
aPTT: >130 s — CR (ref 24.0–33.0)
aPTT: 74.9 s — ABNORMAL HIGH (ref 24.0–33.0)

## 2009-09-30 LAB — CK W/ CKMB & INDEX
CK - MB: 25.1 NG/ML — ABNORMAL HIGH (ref 0.5–3.6)
CK - MB: 110.9 NG/ML — ABNORMAL HIGH (ref 0.5–3.6)
CK - MB: 108.3 NG/ML — ABNORMAL HIGH (ref 0.5–3.6)
CK - MB: 76.5 NG/ML — ABNORMAL HIGH (ref 0.5–3.6)
CK-MB Index: 9.8 — ABNORMAL HIGH (ref 0–2.5)
CK-MB Index: 11.8 — ABNORMAL HIGH (ref 0–2.5)
CK-MB Index: 9.9 — ABNORMAL HIGH (ref 0–2.5)
CK-MB Index: 8.9 — ABNORMAL HIGH (ref 0–2.5)
CK: 256 U/L (ref 39–308)
CK: 942 U/L — ABNORMAL HIGH (ref 39–308)
CK: 1090 U/L — ABNORMAL HIGH (ref 39–308)
CK: 861 U/L — ABNORMAL HIGH (ref 39–308)

## 2009-09-30 LAB — METABOLIC PANEL, BASIC
Anion gap: 16 mmol/L — ABNORMAL HIGH (ref 5–15)
Anion gap: 12 mmol/L (ref 5–15)
Anion gap: 12 mmol/L (ref 5–15)
Anion gap: 7 mmol/L (ref 5–15)
Anion gap: 10 mmol/L (ref 5–15)
BUN/Creatinine ratio: 27 — ABNORMAL HIGH (ref 12–20)
BUN/Creatinine ratio: 27 — ABNORMAL HIGH (ref 12–20)
BUN/Creatinine ratio: 28 — ABNORMAL HIGH (ref 12–20)
BUN/Creatinine ratio: 32 — ABNORMAL HIGH (ref 12–20)
BUN/Creatinine ratio: 31 — ABNORMAL HIGH (ref 12–20)
BUN: 96 MG/DL — ABNORMAL HIGH (ref 6–20)
BUN: 95 MG/DL — ABNORMAL HIGH (ref 6–20)
BUN: 88 MG/DL — ABNORMAL HIGH (ref 6–20)
BUN: 93 MG/DL — ABNORMAL HIGH (ref 6–20)
BUN: 83 MG/DL — ABNORMAL HIGH (ref 6–20)
CO2: 28 MMOL/L (ref 21–32)
CO2: 31 MMOL/L (ref 21–32)
CO2: 31 MMOL/L (ref 21–32)
CO2: 32 MMOL/L (ref 21–32)
CO2: 29 MMOL/L (ref 21–32)
Calcium: 8.4 MG/DL — ABNORMAL LOW (ref 8.5–10.1)
Calcium: 8.4 MG/DL — ABNORMAL LOW (ref 8.5–10.1)
Calcium: 8.5 MG/DL (ref 8.5–10.1)
Calcium: 8.6 MG/DL (ref 8.5–10.1)
Calcium: 7.9 MG/DL — ABNORMAL LOW (ref 8.5–10.1)
Chloride: 88 MMOL/L — ABNORMAL LOW (ref 97–108)
Chloride: 90 MMOL/L — ABNORMAL LOW (ref 97–108)
Chloride: 94 MMOL/L — ABNORMAL LOW (ref 97–108)
Chloride: 98 MMOL/L (ref 97–108)
Chloride: 94 MMOL/L — ABNORMAL LOW (ref 97–108)
Creatinine: 3.6 MG/DL — ABNORMAL HIGH (ref 0.6–1.3)
Creatinine: 3.5 MG/DL — ABNORMAL HIGH (ref 0.6–1.3)
Creatinine: 3.2 MG/DL — ABNORMAL HIGH (ref 0.6–1.3)
Creatinine: 2.9 MG/DL — ABNORMAL HIGH (ref 0.6–1.3)
Creatinine: 2.7 MG/DL — ABNORMAL HIGH (ref 0.6–1.3)
GFR est AA: 22 mL/min/{1.73_m2} — ABNORMAL LOW (ref >60)
GFR est AA: 22 mL/min/{1.73_m2} — ABNORMAL LOW (ref >60)
GFR est AA: 25 mL/min/{1.73_m2} — ABNORMAL LOW (ref >60)
GFR est AA: 28 mL/min/{1.73_m2} — ABNORMAL LOW (ref >60)
GFR est AA: 30 mL/min/{1.73_m2} — ABNORMAL LOW (ref >60)
GFR est non-AA: 18 mL/min/{1.73_m2} — ABNORMAL LOW (ref >60)
GFR est non-AA: 19 mL/min/{1.73_m2} — ABNORMAL LOW (ref >60)
GFR est non-AA: 21 mL/min/{1.73_m2} — ABNORMAL LOW (ref >60)
GFR est non-AA: 23 mL/min/{1.73_m2} — ABNORMAL LOW (ref >60)
GFR est non-AA: 25 mL/min/{1.73_m2} — ABNORMAL LOW (ref >60)
Glucose: 666 MG/DL — CR (ref 65–100)
Glucose: 538 MG/DL — ABNORMAL HIGH (ref 65–100)
Glucose: 274 MG/DL — ABNORMAL HIGH (ref 65–100)
Glucose: 86 MG/DL (ref 65–100)
Glucose: 297 MG/DL — ABNORMAL HIGH (ref 65–100)
Potassium: 4.1 MMOL/L (ref 3.5–5.1)
Potassium: 4 MMOL/L (ref 3.5–5.1)
Potassium: 3.8 MMOL/L (ref 3.5–5.1)
Potassium: 3.5 MMOL/L (ref 3.5–5.1)
Potassium: 3.9 MMOL/L (ref 3.5–5.1)
Sodium: 132 MMOL/L — ABNORMAL LOW (ref 136–145)
Sodium: 133 MMOL/L — ABNORMAL LOW (ref 136–145)
Sodium: 137 MMOL/L (ref 136–145)
Sodium: 137 MMOL/L (ref 136–145)
Sodium: 133 MMOL/L — ABNORMAL LOW (ref 136–145)

## 2009-09-30 LAB — EKG, 12 LEAD, INITIAL
Atrial Rate: 92 {beats}/min
Atrial Rate: 97 {beats}/min
Calculated P Axis: 37 degrees
Calculated P Axis: 52 degrees
Calculated R Axis: 105 degrees
Calculated R Axis: 82 degrees
Calculated T Axis: 100 degrees
Calculated T Axis: 63 degrees
P-R Interval: 168 ms
P-R Interval: 200 ms
Q-T Interval: 406 ms
Q-T Interval: 482 ms
QRS Duration: 108 ms
QRS Duration: 204 ms
QTC Calculation (Bezet): 515 ms
QTC Calculation (Bezet): 596 ms
Ventricular Rate: 92 {beats}/min
Ventricular Rate: 97 {beats}/min

## 2009-09-30 LAB — CBC W/O DIFF
HCT: 23.9 % — ABNORMAL LOW (ref 36.6–50.3)
HGB: 8.3 g/dL — ABNORMAL LOW (ref 12.1–17.0)
MCH: 29 PG (ref 26.0–34.0)
MCHC: 34.7 g/dL (ref 30.0–36.5)
MCV: 83.6 FL (ref 80.0–99.0)
PLATELET: 165 10*3/uL (ref 150–400)
RBC: 2.86 M/uL — ABNORMAL LOW (ref 4.10–5.70)
RDW: 15.3 % — ABNORMAL HIGH (ref 11.5–14.5)
WBC: 9.7 10*3/uL (ref 4.1–11.1)

## 2009-09-30 LAB — TROPONIN I
Troponin-I, Qt.: 1.75 ng/mL — ABNORMAL HIGH (ref <0.05)
Troponin-I, Qt.: 32.04 ng/mL — ABNORMAL HIGH (ref <0.05)
Troponin-I, Qt.: 47.11 ng/mL — ABNORMAL HIGH (ref <0.05)
Troponin-I, Qt.: 23.23 ng/mL — ABNORMAL HIGH (ref <0.05)

## 2009-09-30 LAB — GLUCOSE, POC
Glucose (POC): 475 mg/dL — ABNORMAL HIGH (ref 75–110)
Glucose (POC): 254 mg/dL — ABNORMAL HIGH (ref 75–110)
Glucose (POC): 343 mg/dL — ABNORMAL HIGH (ref 75–110)
Glucose (POC): 165 mg/dL — ABNORMAL HIGH (ref 75–110)
Glucose (POC): 46 mg/dL — CL (ref 75–110)
Glucose (POC): 99 mg/dL (ref 75–110)
Glucose (POC): 118 mg/dL — ABNORMAL HIGH (ref 75–110)
Glucose (POC): 348 mg/dL — ABNORMAL HIGH (ref 75–110)
Glucose (POC): 372 mg/dL — ABNORMAL HIGH (ref 75–110)

## 2009-09-30 LAB — LIPASE: Lipase: 91 U/L (ref 73–393)

## 2009-09-30 LAB — PHOSPHORUS: Phosphorus: 1.9 MG/DL — ABNORMAL LOW (ref 2.5–4.9)

## 2009-09-30 LAB — MAGNESIUM: Magnesium: 2.3 MG/DL (ref 1.6–2.4)

## 2009-09-30 MED ORDER — INSULIN REGULAR HUMAN 100 UNIT/ML INJECTION
100 unit/mL | Freq: Three times a day (TID) | INTRAMUSCULAR | Status: DC
Start: 2009-09-30 — End: 2009-09-30

## 2009-09-30 MED ORDER — SODIUM CHLORIDE 0.9 % IV
INTRAVENOUS | Status: DC
Start: 2009-09-30 — End: 2009-10-01
  Administered 2009-09-30 – 2009-10-01 (×4): via INTRAVENOUS

## 2009-09-30 MED ORDER — DEXTROSE 50% IN WATER (D50W) IV SYRG
INTRAVENOUS | Status: DC | PRN
Start: 2009-09-30 — End: 2009-09-30

## 2009-09-30 MED ORDER — HEPARIN (PORCINE) 5,000 UNIT/ML IJ SOLN
5000 unit/mL | INTRAMUSCULAR | Status: DC | PRN
Start: 2009-09-30 — End: 2009-10-15

## 2009-09-30 MED ORDER — DOCUSATE SODIUM 100 MG CAP
100 mg | Freq: Two times a day (BID) | ORAL | Status: DC
Start: 2009-09-30 — End: 2009-10-15
  Administered 2009-09-30 – 2009-10-15 (×28): via ORAL

## 2009-09-30 MED ORDER — ROSUVASTATIN 10 MG TAB
10 mg | Freq: Every day | ORAL | Status: DC
Start: 2009-09-30 — End: 2009-10-15
  Administered 2009-09-30 – 2009-10-15 (×16): via ORAL

## 2009-09-30 MED ORDER — ASPIRIN 81 MG CHEWABLE TAB
81 mg | Freq: Every day | ORAL | Status: DC
Start: 2009-09-30 — End: 2009-10-15
  Administered 2009-09-30 – 2009-10-15 (×16): via ORAL

## 2009-09-30 MED ORDER — FAMOTIDINE 20 MG TAB
20 mg | Freq: Every day | ORAL | Status: DC
Start: 2009-09-30 — End: 2009-10-14
  Administered 2009-09-30 – 2009-10-14 (×15): via ORAL

## 2009-09-30 MED ORDER — GLUCOSE 4 GRAM CHEWABLE TAB
4 gram | ORAL | Status: DC | PRN
Start: 2009-09-30 — End: 2009-10-15
  Administered 2009-10-02 – 2009-10-07 (×2): via ORAL

## 2009-09-30 MED ORDER — HEPARIN (PORCINE) IN D5W 25,000 UNIT/250 ML IV
25000 unit/250 mL(100 unit/mL) | INTRAVENOUS | Status: DC
Start: 2009-09-30 — End: 2009-10-09
  Administered 2009-09-30 – 2009-10-09 (×24): via INTRAVENOUS

## 2009-09-30 MED ORDER — HEPARIN (PORCINE) 5,000 UNIT/ML IJ SOLN
5000 unit/mL | Freq: Once | INTRAMUSCULAR | Status: AC
Start: 2009-09-30 — End: 2009-09-30
  Administered 2009-09-30: 13:00:00 via INTRAVENOUS

## 2009-09-30 MED ORDER — LEVOTHYROXINE 150 MCG TAB
150 mcg | Freq: Every day | ORAL | Status: DC
Start: 2009-09-30 — End: 2009-10-15
  Administered 2009-09-30 – 2009-10-15 (×16): via ORAL

## 2009-09-30 MED ORDER — AMLODIPINE 5 MG TAB
5 mg | Freq: Every day | ORAL | Status: DC
Start: 2009-09-30 — End: 2009-09-30

## 2009-09-30 MED ORDER — METOPROLOL TARTRATE 25 MG TAB
25 mg | Freq: Two times a day (BID) | ORAL | Status: DC
Start: 2009-09-30 — End: 2009-09-29

## 2009-09-30 MED ORDER — TERAZOSIN 5 MG CAP
5 mg | Freq: Every evening | ORAL | Status: DC
Start: 2009-09-30 — End: 2009-10-15
  Administered 2009-10-01 – 2009-10-15 (×15): via ORAL

## 2009-09-30 MED ORDER — NITROGLYCERIN 0.4 MG SUBLINGUAL TAB
0.4 mg | SUBLINGUAL | Status: DC | PRN
Start: 2009-09-30 — End: 2009-10-15
  Administered 2009-10-12 (×2): via SUBLINGUAL

## 2009-09-30 MED ORDER — RANITIDINE 150 MG CAP
150 mg | Freq: Two times a day (BID) | ORAL | Status: DC
Start: 2009-09-30 — End: 2009-09-29

## 2009-09-30 MED ORDER — HEPARIN (PORCINE) 5,000 UNIT/ML IJ SOLN
5000 unit/mL | INTRAMUSCULAR | Status: DC | PRN
Start: 2009-09-30 — End: 2009-10-15
  Administered 2009-10-01: 06:00:00 via INTRAVENOUS

## 2009-09-30 MED ORDER — CLOPIDOGREL 75 MG TAB
75 mg | Freq: Every day | ORAL | Status: DC
Start: 2009-09-30 — End: 2009-09-29

## 2009-09-30 MED ORDER — CLOPIDOGREL 75 MG TAB
75 mg | Freq: Every day | ORAL | Status: DC
Start: 2009-09-30 — End: 2009-10-15
  Administered 2009-09-30 – 2009-10-15 (×16): via ORAL

## 2009-09-30 MED ORDER — ASPIRIN 81 MG CHEWABLE TAB
81 mg | Freq: Every day | ORAL | Status: DC
Start: 2009-09-30 — End: 2009-09-30
  Administered 2009-09-30: 04:00:00 via ORAL

## 2009-09-30 MED ORDER — METOPROLOL TARTRATE 25 MG TAB
25 mg | Freq: Two times a day (BID) | ORAL | Status: DC
Start: 2009-09-30 — End: 2009-10-01
  Administered 2009-09-30 – 2009-10-01 (×4): via ORAL

## 2009-09-30 MED ORDER — SODIUM CHLORIDE 0.9% BOLUS IV
0.9 % | Freq: Once | INTRAVENOUS | Status: AC
Start: 2009-09-30 — End: 2009-09-30
  Administered 2009-09-30: 09:00:00 via INTRAVENOUS

## 2009-09-30 MED ORDER — INSULIN GLARGINE 100 UNIT/ML INJECTION
100 unit/mL | Freq: Every day | SUBCUTANEOUS | Status: DC
Start: 2009-09-30 — End: 2009-10-02
  Administered 2009-09-30 – 2009-10-01 (×2): via SUBCUTANEOUS

## 2009-09-30 MED ORDER — INSULIN ASPART 100 UNIT/ML INJECTION
100 unit/mL | Freq: Four times a day (QID) | SUBCUTANEOUS | Status: DC
Start: 2009-09-30 — End: 2009-10-13
  Administered 2009-09-30 – 2009-10-13 (×37): via SUBCUTANEOUS

## 2009-09-30 MED ORDER — GLUCAGON 1 MG INJECTION
1 mg | INTRAMUSCULAR | Status: DC | PRN
Start: 2009-09-30 — End: 2009-10-15

## 2009-09-30 MED ORDER — ISOSORBIDE MONONITRATE SR 30 MG 24 HR TAB
30 mg | ORAL | Status: DC
Start: 2009-09-30 — End: 2009-09-30
  Administered 2009-09-30: 12:00:00 via ORAL

## 2009-09-30 MED ORDER — HEPARIN (PORCINE) 5,000 UNIT/ML IJ SOLN
5000 unit/mL | Freq: Two times a day (BID) | INTRAMUSCULAR | Status: DC
Start: 2009-09-30 — End: 2009-09-30
  Administered 2009-09-30: 04:00:00 via SUBCUTANEOUS

## 2009-09-30 MED ORDER — BISACODYL 5 MG TAB, DELAYED RELEASE
5 mg | Freq: Every day | ORAL | Status: DC | PRN
Start: 2009-09-30 — End: 2009-10-15
  Administered 2009-10-07: 13:00:00 via ORAL

## 2009-09-30 MED ORDER — FISH OIL-OMEGA-3 FATTY ACIDS 1,000 MG-340 MG CAP
340-1000 mg | Freq: Two times a day (BID) | ORAL | Status: DC
Start: 2009-09-30 — End: 2009-10-15
  Administered 2009-09-30 – 2009-10-15 (×31): via ORAL

## 2009-09-30 MED ORDER — BISACODYL 10 MG RECTAL SUPPOSITORY
10 mg | Freq: Every day | RECTAL | Status: DC | PRN
Start: 2009-09-30 — End: 2009-10-15

## 2009-09-30 MED ORDER — ASPIRIN 325 MG TAB
325 mg | Freq: Every day | ORAL | Status: DC
Start: 2009-09-30 — End: 2009-09-30

## 2009-09-30 MED ORDER — DEXTROSE 50% IN WATER (D50W) IV SYRG
INTRAVENOUS | Status: DC | PRN
Start: 2009-09-30 — End: 2009-10-15

## 2009-09-30 MED FILL — ISOSORBIDE MONONITRATE SR 30 MG 24 HR TAB: 30 mg | ORAL | Qty: 2

## 2009-09-30 MED FILL — SODIUM CHLORIDE 0.9 % IV: INTRAVENOUS | Qty: 1000

## 2009-09-30 MED FILL — HEPARIN (PORCINE) 5,000 UNIT/ML IJ SOLN: 5000 unit/mL | INTRAMUSCULAR | Qty: 1

## 2009-09-30 MED FILL — CRESTOR 10 MG TABLET: 10 mg | ORAL | Qty: 2

## 2009-09-30 MED FILL — INSULIN ASPART 100 UNIT/ML INJECTION: 100 unit/mL | SUBCUTANEOUS | Qty: 1

## 2009-09-30 MED FILL — INSULIN REGULAR HUMAN 100 UNIT/ML INJECTION: 100 unit/mL | INTRAMUSCULAR | Qty: 1

## 2009-09-30 MED FILL — NITRO-BID 2 % TRANSDERMAL OINTMENT: 2 % | TRANSDERMAL | Qty: 1

## 2009-09-30 MED FILL — FISH OIL 340 MG-1,000 MG CAPSULE: 340-1000 mg | ORAL | Qty: 2

## 2009-09-30 MED FILL — METOPROLOL TARTRATE 25 MG TAB: 25 mg | ORAL | Qty: 1

## 2009-09-30 MED FILL — LEVOTHYROXINE 150 MCG TAB: 150 mcg | ORAL | Qty: 1

## 2009-09-30 MED FILL — PLAVIX 75 MG TABLET: 75 mg | ORAL | Qty: 1

## 2009-09-30 MED FILL — FAMOTIDINE 20 MG TAB: 20 mg | ORAL | Qty: 1

## 2009-09-30 MED FILL — HEPARIN (PORCINE) IN D5W 25,000 UNIT/250 ML IV: 25000 unit/250 mL(100 unit/mL) | INTRAVENOUS | Qty: 250

## 2009-09-30 MED FILL — ASPIRIN 81 MG CHEWABLE TAB: 81 mg | ORAL | Qty: 1

## 2009-09-30 MED FILL — ASPIRIN 81 MG CHEWABLE TAB: 81 mg | ORAL | Qty: 2

## 2009-09-30 MED FILL — DOK 100 MG CAPSULE: 100 mg | ORAL | Qty: 1

## 2009-09-30 MED FILL — INSULIN GLARGINE 100 UNIT/ML INJECTION: 100 unit/mL | SUBCUTANEOUS | Qty: 0.3

## 2009-09-30 MED FILL — DEXTROSE 50% IN WATER (D50W) IV SYRG: INTRAVENOUS | Qty: 50

## 2009-09-30 NOTE — Progress Notes (Signed)
TRANSFER - IN REPORT:    Verbal report received from Park Breed RN(name) on Travis [name]  being received from ER(unit) for routine progression of care      Report consisted of patient???s Situation, Background, Assessment and   Recommendations(SBAR).     Information from the following report(s) SBAR, Kardex, ED Summary and MAR was reviewed with the receiving nurse.    Opportunity for questions and clarification was provided.      Assessment completed upon patient???s arrival to unit and care assumed.

## 2009-09-30 NOTE — Progress Notes (Signed)
[facility]  [address] [place], [place] [address]  [phone]      Medical Progress Note      NAME: Travis [name]   DOB:  [birth year]  MRM:  [id number]    Date/Time: 09/30/2009  8:23 AM       Subjective:     Chief Complaint:  Patient was seen and examined by me.  Chart reviewed.  Elevated Trop overnight, however, chest and back pain has improved.  No SOB       Objective:       Vitals:       Last 24hrs VS reviewed since prior progress note. Most recent are:    Visit Vitals   Item Reading   ??? BP 98/35   ??? Pulse 88   ??? Temp 97.4 ??F (36.3 ??C)   ??? Resp 22   ??? Ht 5\' 6"    ??? Wt 166 lb   ??? BMI 26.79 kg/m2   ??? SpO2 95%       SpO2 Readings from Last 6 Encounters:   09/30/09 95%   03/31/09 94%            Intake/Output Summary (Last 24 hours) at 09/30/09 2130  Last data filed at 09/30/09 0600   Gross per 24 hour   Intake 1221.6 ml   Output    550 ml   Net  671.6 ml          Exam:     Physical Exam:    Gen: Well-developed, well-nourished, NAD  HEENT: Pink conjunctivae, PERRL, hearing intact to voice, dry mucous membranes   Neck: Supple, without masses, thyroid non-tender   Resp: No accessory muscle use, clear breath sounds without wheezes rales or rhonchi   Card: 2/6 SEM, normal S1, S2 without thrills, bruits or peripheral edema   Abd: Soft, non-tender, non-distended, normoactive bowel sounds are present, no palpable organomegaly and no detectable hernias   Lymph: No cervical adenopathy   Musc: Back pain to palpation of left shoulder blade   Skin: No rashes or ulcers, skin turgor is good   Neuro: Cranial nerves 3-12 are grossly intact, follows commands appropriately   Psych: Alert with good insight. Oriented to person, place, and time    Telemetry reviewed:   normal sinus rhythm    Medications Reviewed: (see below)    Lab Data Reviewed: (see below)    ______________________________________________________________________    Medications:     Current facility-administered medications    Medication Dose Route Frequency   ??? sodium chloride 0.9 % bolus infusion 250 mL  250 mL IntraVENous ONCE   ??? insulin glargine (LANTUS) injection 30 Units  30 Units SubCUTAneous DAILY   ??? sodium chloride 0.9 % bolus infusion 500 mL  500 mL IntraVENous ONCE   ??? insulin regular (NOVOLIN, HUMULIN) injection 10 Units  10 Units IntraVENous NOW   ??? nitroglycerin (NITROBID) 2 % ointment 1 Inch  1 Inch Topical BID   ??? morphine injection 4 mg  4 mg IntraVENous NOW   ??? sodium chloride 0.9 % bolus infusion 500 mL  500 mL IntraVENous ONCE   ??? aspirin chewable tablet 81 mg  81 mg Oral DAILY   ??? clopidogrel (PLAVIX) tablet 75 mg  75 mg Oral DAILY   ??? isosorbide mononitrate ER (IMDUR) tablet 60 mg  60 mg Oral 7am   ??? levothyroxine (SYNTHROID) tablet 150 mcg  150 mcg Oral ACB   ??? nitroglycerin (NITROSTAT) tablet  0.4 mg  0.4 mg SubLINGual Q5MIN PRN   ??? fish oil-omega-3 fatty acids 340-1,000 mg capsule 2 Cap  2 Cap Oral BID   ??? rosuvastatin (CRESTOR) tablet 20 mg  20 mg Oral DAILY   ??? terazosin (HYTRIN) capsule 10 mg  10 mg Oral QHS   ??? 0.9% sodium chloride infusion  125 mL/hr IntraVENous CONTINUOUS   ??? docusate sodium (COLACE) capsule 100 mg  100 mg Oral BID   ??? bisacodyl (DULCOLAX) tablet 5 mg  5 mg Oral DAILY PRN   ??? bisacodyl (DULCOLAX) suppository 10 mg  10 mg Rectal DAILY PRN   ??? heparin (porcine) injection 5,000 Units  5,000 Units SubCUTAneous Q12H   ??? oxycodone-acetaminophen (PERCOCET) 5-325 mg per tablet 2 Tab  2 Tab Oral Q6H PRN   ??? morphine injection 2 mg  2 mg IntraVENous Q4H PRN   ??? metoprolol (LOPRESSOR) tablet 25 mg  25 mg Oral BID   ??? amlodipine (NORVASC) tablet 5 mg  5 mg Oral DAILY   ??? dextrose (D50W) injection Syrg 25 g  25 g IntraVENous PRN   ??? glucose chewable tablet 12 g  12 g Oral PRN   ??? glucagon (GLUCAGEN) injection 1 mg  1 mg IntraMUSCular PRN   ??? insulin regular (NOVOLIN, HUMULIN) injection   IntraVENous TIDPC   ??? famotidine (PEPCID) tablet 20 mg  20 mg Oral DAILY            Lab Review:     Recent Labs    Basename 09/30/09 0406 09/29/09 1600   ??? WBC 9.7 10.4   ??? HGB 8.3* 8.5*   ??? HCT 23.9* 27.2*   ??? PLT 165 168       Recent Labs   Basename 09/30/09 0406 09/30/09 0130 09/30/09 0008 09/29/09 2300 09/29/09 1935 09/29/09 1600   ??? NA 137 133* 132* -- -- --   ??? K 3.8 4.0 4.1 -- -- --   ??? CL 94* 90* 88* -- -- --   ??? CO2 31 31 28  -- -- --   ??? GLU 274* 538* 666* -- -- --   ??? BUN 88* 95* 96* -- -- --   ??? CREA 3.2* 3.5* 3.6* -- -- --   ??? CA 8.5 8.4* 8.4* -- -- --   ??? MG 2.3 -- -- -- -- --   ??? PHOS 1.9* -- -- -- -- --   ??? ALB -- -- -- 3.6 3.5 3.8   ??? TBIL -- -- -- 0.4 0.6 0.6   ??? SGOT -- -- -- 70* 31 28   ??? INR -- -- -- -- -- --       Lab Results   Component Value Date/Time    POC GLUCOSE 46 09/30/2009  7:54 AM    POC GLUCOSE 99 09/30/2009  6:46 AM    POC GLUCOSE 165 09/30/2009  5:40 AM    POC GLUCOSE 254 09/30/2009  4:37 AM    POC GLUCOSE 343 09/30/2009  3:36 AM            Assessment / Plan:     69 yo AAM hx of CAD, DM, CKD 3, AV block with pacer, who presented with chest and back pain. Also found to be in DKA and has a NSTEMI    1) DKA (diabetic ketoacidosis): due to medical non-compliance and NSTEMI. Stopped insulin x2 days PTA for "feeling bad." Glucose and AG improved with insulin gtt.  Restart Lantus today. CXR and U/A neg  for infection.     2) NSTEMI/CAD: Hx of multiple MI.  S/p cath x2 with stents this year.  Cath showed diffused CAD.  Cardiology consulted, will start heparin gtt.  Get Echo.   Cont. ASA, plavix, statin, BB. Monitor Trop     3) Acute kidney failure: due to DKA and dehydration. Has CKD 3. Hold bumex and lisinopril. Will Cont. IVF, monitor     4) Chronic kidney disease, stage III (moderate): baseline Cr from 1.5 to 2.5. Cont. To monitor     5) Hyperkalemia (09/29/2009): from acidosis. Improved with insulin    6) Hyposmolality and/or hyponatremia (09/29/2009): due to hyperglycemia and hypovolemia. Improved with IVF.  Cont. To monitor, cont IVF     7) Anemia, unspecified (09/29/2009): chronic, baseline Hgb 8-10. Monitor      8) Chest pain, unspecified (09/29/2009): due to NSTEMI. S/p multiple cath and cardiology evaluations. Cont nitro, ASA, plavix, BB, statin. Cardiology following.     9) Back pain (09/29/2009): combination of NSTEMI and MSK. Pain control prn     Full Code    Total time spent with patient: 23 Minutes                  Care Plan discussed with: Patient    Discussed:  Care Plan    Prophylaxis:  Hep SQ    Disposition:  Home w/Family           ___________________________________________________    Attending Physician: Twanna Hy. Jakari Jacot, MD

## 2009-09-30 NOTE — Progress Notes (Signed)
Problem: Falls - Risk of  Goal: *Absence of falls  Outcome: Progressing Towards Goal  Instruct patient to call for assistance when getting up.    Problem: Pressure Ulcer - Risk of  Goal: *Prevention of pressure ulcer  Outcome: Progressing Towards Goal  Patient able to turn self and currently on Insulin drip with Blood glucose gradually coming under control.    Problem: Unstable angina/NSTEMI: Day of Admission/Day 1  Goal: Activity/Safety  Outcome: Progressing Towards Goal  Patient resting in bed.  Goal: Consults  Outcome: Progressing Towards Goal  Dr. Servando Snare already saw patient in ed as well as pastoral care  Goal: Diagnostic Test/Procedures  Outcome: Progressing Towards Goal  Previous Echo noted.  BMP, CBC, ECG, CXR, Cardiac enzymes done.      Goal: Medications  Outcome: Progressing Towards Goal  Gave ASA, Metoprolol, and Heparin at 2400.  Held Hytrin over concern of blood pressure.  Receiving IVF normal saline at 163ml/H.

## 2009-09-30 NOTE — Progress Notes (Signed)
Spiritual Care Assessment/Progress Notes    Travis [name] [id number]  [id number]    27-Mar-1941  69 y.o.  male    Patient Telephone Number: [phone] (home)   Religious Affiliation: Non denominational   Language: English   Extended Emergency Contact Information  Primary Emergency Contact: Travis [name]  Home Phone: [phone]  Relation: Child   Patient Active Problem List   Diagnoses Date Noted   ??? Acute myocardial infarction, unspecified site, initial episode of care [410.91] 09/30/2009   ??? DKA (diabetic ketoacidosis) [250.10AN] 09/29/2009   ??? Coronary atherosclerosis of native coronary artery [414.01] 09/29/2009   ??? Acute kidney failure, unspecified [584.9] 09/29/2009   ??? Chronic kidney disease, stage III (moderate) [585.3] 09/29/2009   ??? Hyperkalemia [276.7A] 09/29/2009   ??? Hyposmolality and/or hyponatremia [276.1] 09/29/2009   ??? Anemia, unspecified [285.9] 09/29/2009   ??? Back pain [724.5E] 09/29/2009   ??? CHF (congestive heart failure) [428.0R]    ??? CAD (coronary artery disease) [414.00AE]           Date: 09/30/2009       Level of Religious/Spiritual Activity:  [x]           Involved in faith tradition/spiritual practice    []           Not involved in faith tradition/spiritual practice  []           Spiritually oriented    []           Claims no spiritual orientation    []           seeking spiritual identity  []           Feels alienated from religious practice/tradition  []           Feels angry about religious practice/tradition  [x]           Spirituality/religious tradition is a Theatre stage manager for coping at this time.  []           Not able to assess due to medical condition    Services Provided Today:  []           crisis intervention    []           reading Scriptures  [x]           spiritual assessment    []           prayer  [x]           empathic listening/emotional support  []           rites and rituals (cite in comments)  []           life review     []           religious support   []           theological development   []           advocacy  []           ethical dialog     []           blessing  []           bereavement support    []           support to family  []           anticipatory grief support   []           help with AMD  []           spiritual guidance    []   meditation      Spiritual Care Needs  [x]           Emotional Support  []           Spiritual/Religious Care  []           Loss/Adjustment  []           Advocacy/Referral /Ethics  []           No needs expressed at this time  []           Other: (note in comments)  Spiritual Care Plan  []           Follow up visits with pt/family  []           Provide materials  []           Schedule sacraments  []           Contact [facility]  [x]           Follow up as needed  []           Other: (note in comments)     Comments: Visited with pt, and provided emotional support and info about spiritual care services. Pt reports his family visits him in the hospital.    Rev. Theotis Barrio, M.Div., M.A., Chaplain

## 2009-09-30 NOTE — Progress Notes (Signed)
Notified by lab tech that ptt sample possibly contaminated. Redrew ptt and sent to lab stat.

## 2009-09-30 NOTE — Progress Notes (Signed)
Bedside and Verbal shift change report given to Irving Burton RN (oncoming nurse) by Hassell Halim RN (offgoing nurse).  Report given with SBAR, Kardex and MAR.

## 2009-09-30 NOTE — Progress Notes (Signed)
Ptt, Chem 7 and cardiac enzymes drawn and sent to lab.

## 2009-09-30 NOTE — Progress Notes (Signed)
09/30/2009     Admit Date: 09/29/2009    Admit Diagnosis: DKA (DIABETIC KETOACIDOSIS)    Patient Active Hospital Problem List:  *DKA (diabetic ketoacidosis) (09/29/2009)    Acute myocardial infarction, unspecified site, initial episode of care (09/30/2009)    Coronary atherosclerosis of native coronary artery (09/29/2009)    Acute kidney failure, unspecified (09/29/2009)    Chronic kidney disease, stage III (moderate) (09/29/2009)    Hyperkalemia (09/29/2009)    Hyposmolality and/or hyponatremia (09/29/2009)    Anemia, unspecified (09/29/2009)    Back pain (09/29/2009)      Assessment/Plan:  Recurrent MI now in the setting of DKA, acute on CKD, worsening anemia. Most likely scenario is "demand" ischemia from residual cx occlusion but primary plaque rupture/restenosis certainly a possibility. Going forward, Rx as ACS - add heparin, continue dual antiplt Rx, stop norvasc given tenuous BP. Echo to evaluate LVEF. Watch for HF.      Subjective:  No recurrent chest pain. Breathing comfortably on RA. Hx of lung disease noted - inhalers at home. Patient denies any exertional chest pain,  palpitations, syncope, orthopnea, edema or paroxysmal nocturnal dyspnea. No recurrent n/v. No focal neuro sx.          Pertinent labs - CK 900, Trop 30, prBNP 1610   [name], MD    Objective:   BP 117/43   Pulse 85   Temp 98 ??F (36.7 ??C)   Resp 16   Ht 5\' 6"  (1.676 m)   Wt 166 lb (75.297 kg)   BMI 26.79 kg/m2   SpO2 96%     Physical Exam:  Neck: supple, symmetrical, trachea midline, no adenopathy, thyroid: not enlarged, symmetric, no tenderness/mass/nodules and no JVD  Heart: normal apical impulse, regular rate and rhythm, S1, S2 normal, no S3 or S4, systolic murmur: early systolic 2/6, crescendo, decrescendo at 2nd left intercostal space  Lungs: rhonchi R base  Abdomen: soft, non-tender. Bowel sounds normal. No masses,  no organomegaly  Extremities: extremities normal, atraumatic, no cyanosis or edema      Labs:    Recent Labs    [facility] 09/30/09 0406   ??? CPK 942*   ??? CPKMB 110.9*   ??? TROIQ 32.04*       Recent Labs   Basename 09/30/09 0406   ??? NA 137   ??? K 3.8   ??? CL 94*   ??? BUN 88*   ??? CREA 3.2*   ??? GLU 274*   ??? PHOS 1.9*   ??? CA 8.5       Recent Labs   Basename 09/30/09 0406   ??? WBC 9.7   ??? HGB 8.3*   ??? HCT 23.9*   ??? PLT 165           Telemetry: normal sinus rhythm intermittent pacing noted. No EKG from today as yet     Current facility-administered medications   Medication Dose Route Frequency   ??? sodium chloride 0.9 % bolus infusion 250 mL  250 mL IntraVENous ONCE   ??? insulin glargine (LANTUS) injection 30 Units  30 Units SubCUTAneous DAILY   ??? sodium chloride 0.9 % bolus infusion 500 mL  500 mL IntraVENous ONCE   ??? insulin regular (NOVOLIN, HUMULIN) injection 10 Units  10 Units IntraVENous NOW   ??? nitroglycerin (NITROBID) 2 % ointment 1 Inch  1 Inch Topical BID   ??? morphine injection 4 mg  4 mg IntraVENous NOW   ??? sodium chloride 0.9 % bolus infusion 500  mL  500 mL IntraVENous ONCE   ??? aspirin chewable tablet 81 mg  81 mg Oral DAILY   ??? clopidogrel (PLAVIX) tablet 75 mg  75 mg Oral DAILY   ??? isosorbide mononitrate ER (IMDUR) tablet 60 mg  60 mg Oral 7am   ??? levothyroxine (SYNTHROID) tablet 150 mcg  150 mcg Oral ACB   ??? nitroglycerin (NITROSTAT) tablet 0.4 mg  0.4 mg SubLINGual Q5MIN PRN   ??? fish oil-omega-3 fatty acids 340-1,000 mg capsule 2 Cap  2 Cap Oral BID   ??? rosuvastatin (CRESTOR) tablet 20 mg  20 mg Oral DAILY   ??? terazosin (HYTRIN) capsule 10 mg  10 mg Oral QHS   ??? 0.9% sodium chloride infusion  125 mL/hr IntraVENous CONTINUOUS   ??? docusate sodium (COLACE) capsule 100 mg  100 mg Oral BID   ??? bisacodyl (DULCOLAX) tablet 5 mg  5 mg Oral DAILY PRN   ??? bisacodyl (DULCOLAX) suppository 10 mg  10 mg Rectal DAILY PRN   ??? heparin (porcine) injection 5,000 Units  5,000 Units SubCUTAneous Q12H   ??? oxycodone-acetaminophen (PERCOCET) 5-325 mg per tablet 2 Tab  2 Tab Oral Q6H PRN   ??? morphine injection 2 mg  2 mg IntraVENous Q4H PRN    ??? metoprolol (LOPRESSOR) tablet 25 mg  25 mg Oral BID   ??? amlodipine (NORVASC) tablet 5 mg  5 mg Oral DAILY   ??? dextrose (D50W) injection Syrg 25 g  25 g IntraVENous PRN   ??? glucose chewable tablet 12 g  12 g Oral PRN   ??? glucagon (GLUCAGEN) injection 1 mg  1 mg IntraMUSCular PRN   ??? insulin regular (NOVOLIN, HUMULIN) injection   IntraVENous TIDPC   ??? famotidine (PEPCID) tablet 20 mg  20 mg Oral DAILY   ??? DISCONTD: insulin regular (NOVOLIN, HUMULIN) 100 Units in 0.9% sodium chloride 100 mL infusion  10 Units/hr IntraVENous TITRATE   ??? DISCONTD: amlodipine (NORVASC) tablet 5 mg  5 mg Oral DAILY   ??? DISCONTD: metoprolol (LOPRESSOR) tablet 25 mg  25 mg Oral Q12H   ??? DISCONTD: ranitidine hcl Cap 150 mg  150 mg Oral BID   ??? DISCONTD: aspirin (ASPIRIN) tablet 325 mg  325 mg Oral DAILY   ??? DISCONTD: clopidogrel (PLAVIX) tablet 75 mg  75 mg Oral DAILY     Recent Results (from the past 24 hour(s))   CBC WITH AUTOMATED DIFF    Collection Time    09/29/09  4:00 PM   Component Value Range   ??? WBC 10.4  4.1 - 11.1 (K/uL)   ??? RBC 2.88 (*) 4.10 - 5.70 (M/uL)   ??? HGB 8.5 (*) 12.1 - 17.0 (g/dL)   ??? HCT 27.2 (*) 36.6 - 50.3 (%)   ??? MCV 94.4  80.0 - 99.0 (FL)   ??? MCH 29.5  26.0 - 34.0 (PG)   ??? MCHC 31.3  30.0 - 36.5 (g/dL)   ??? RDW 16.7 (*) 11.5 - 14.5 (%)   ??? PLATELET 168  150 - 400 (K/uL)   ??? NEUTROPHILS 81 (*) 32 - 75 (%)   ??? LYMPHOCYTES 11 (*) 12 - 49 (%)   ??? MONOCYTES 8  5 - 13 (%)   ??? EOSINOPHILS 0  0 - 7 (%)   ??? BASOPHILS 0  0 - 1 (%)   ??? ABSOLUTE NEUTS 8.5 (*) 1.8 - 8.0 (K/UL)   ??? ABSOLUTE LYMPHS 1.1  0.8 - 3.5 (K/UL)   ??? ABSOLUTE MONOS 0.8  0.0 - 1.0 (K/UL)   ??? ABSOLUTE EOSINS 0.0  0.0 - 0.4 (K/UL)   ??? ABSOLUTE BASOS 0.0  0.0 - 0.1 (K/UL)   ??? DF SMEAR SCANNED     ??? RBC COMMENTS NORMOCYTIC, NORMOCHROMIC     CK W/ CKMB & INDEX    Collection Time    09/29/09  4:00 PM   Component Value Range   ??? CK 197  39 - 308 (U/L)   ??? CK - MB 13.9 (*) 0.5 - 3.6 (NG/ML)   ??? CK-MB Index 7.1 (*) 0 - 2.5 ( )   HEPATIC FUNCTION PANEL     Collection Time    09/29/09  4:00 PM   Component Value Range   ??? Protein, total 7.7  6.4 - 8.2 (g/dL)   ??? Albumin 3.8  3.5 - 5.0 (g/dL)   ??? Globulin 3.9  2.0 - 4.0 (g/dL)   ??? A-G Ratio 1.0 (*) 1.1 - 2.2 ( )   ??? Bilirubin, total 0.6  0.2 - 1.0 (MG/DL)   ??? Bilirubin, direct 0.2  0.0 - 0.2 (MG/DL)   ??? Alk. phosphatase 91  50 - 136 (U/L)   ??? AST 28  15 - 37 (U/L)   ??? ALT 28  12 - 78 (U/L)   PRO-BNP    Collection Time    09/29/09  4:00 PM   Component Value Range   ??? NT pro-BNP 2981 (*) 0 - 125 (PG/ML)   POC CHEM8    Collection Time    09/29/09  4:00 PM   Component Value Range   ??? Calcium, ionized (POC) 0.93 (*) 1.12 - 1.32 (MMOL/L)   ??? Sodium (POC) 122 (*) 137 - 145 (MMOL/L)   ??? Potassium (POC) 6.0 (*) 3.5 - 5.1 (MMOL/L)   ??? Chloride (POC) 87 (*) 98 - 107 (MMOL/L)   ??? CO2 (POC) 16 (*) 21 - 32 (MMOL/L)   ??? Anion gap (POC) 26 (*) 5 - 15 ( )   ??? Glucose (POC) >700 (*) 75 - 110 (MG/DL)   ??? BUN (POC) 161 (*) 9 - 20 (MG/DL)   ??? Creatinine (POC) 3.1 (*) 0.6 - 1.3 (MG/DL)   ??? GFR-AA (POC) 26 (*) >60 (ml/min/1.65m2)   ??? GFR, non-AA (POC) 21 (*) >60 (ml/min/1.84m2)   ??? Hemoglobin (POC) 9.5 (*) 12.1 - 17.0 (GM/DL)   ??? Hematocrit (POC) 28 (*) 36.6 - 50.3 (%)   ??? Comment Comment Not Indicated.     POC TROPONIN-I    Collection Time    09/29/09  4:02 PM   Component Value Range   ??? Troponin-I (POC) 0.14 (*) 0.00 - 0.08 (ng/mL)   URINALYSIS W/ REFLEX CULTURE    Collection Time    09/29/09  5:15 PM   Component Value Range   ??? Color YELLOW     ??? Appearance CLEAR     ??? Specific gravity 1.020  1.003 - 1.030 ( )   ??? pH 5.0  5.0 - 8.0 ( )   ??? Protein NEGATIVE   NEGATIVE (MG/DL)   ??? Glucose >0960 (*) NEGATIVE (MG/DL)   ??? Ketone TRACE (*) NEGATIVE (MG/DL)   ??? Bilirubin NEGATIVE   NEGATIVE    ??? Blood NEGATIVE   NEGATIVE    ??? Urobilinogen 0.2  0.2 - 1.0 (EU/DL)   ??? Nitrites NEGATIVE   NEGATIVE    ??? Leukocyte Esterase NEGATIVE   NEGATIVE    ??? WBC 0-4  0 - 4 (/HPF)   ???  RBC 3-5  0 - 5 (/HPF)   ??? Epithelial cells 0-5  0 - 5 (/LPF)    ??? Bacteria NEGATIVE   NEGATIVE (/HPF)   ??? UA:UC IF INDICATED CULTURE NOT INDICATED BY UA RESULT     ??? Hyaline Cast 0-2  0 - 2    VENOUS BLOOD PH    Collection Time    09/29/09  5:50 PM   Component Value Range   ??? VENOUS PH 7.29 (*) 7.32 - 7.42 ( )   CK W/ CKMB & INDEX    Collection Time    09/29/09  7:35 PM   Component Value Range   ??? CK 256  39 - 308 (U/L)   ??? CK - MB 25.1 (*) 0.5 - 3.6 (NG/ML)   ??? CK-MB Index 9.8 (*) 0 - 2.5 ( )   TROPONIN I    Collection Time    09/29/09  7:35 PM   Component Value Range   ??? Troponin-I, Qt. 1.75 (*) <0.05 (ng/mL)   LIPASE    Collection Time    09/29/09  7:35 PM   Component Value Range   ??? Lipase 91  73 - 393 (U/L)   METABOLIC PANEL, COMPREHENSIVE    Collection Time    09/29/09  7:35 PM   Component Value Range   ??? Sodium 127 (*) 136 - 145 (MMOL/L)   ??? Potassium 4.7  3.5 - 5.1 (MMOL/L)   ??? Chloride 79 (*) 97 - 108 (MMOL/L)   ??? CO2 17 (*) 21 - 32 (MMOL/L)   ??? Anion gap 31 (*) 5 - 15 (mmol/L)   ??? Glucose 985 (*) 65 - 100 (MG/DL)   ??? BUN 99 (*) 6 - 20 (MG/DL)   ??? Creatinine 3.9 (*) 0.6 - 1.3 (MG/DL)   ??? BUN/Creatinine ratio 25 (*) 12 - 20 ( )   ??? GFR est AA 20 (*) >60 (ml/min/1.18m2)   ??? GFR est non-AA 16 (*) >60 (ml/min/1.6m2)   ??? Calcium 8.3 (*) 8.5 - 10.1 (MG/DL)   ??? Bilirubin, total 0.6  0.2 - 1.0 (MG/DL)   ??? ALT 29  12 - 78 (U/L)   ??? AST 31  15 - 37 (U/L)   ??? Alk. phosphatase 88  50 - 136 (U/L)   ??? Protein, total 7.9  6.4 - 8.2 (g/dL)   ??? Albumin 3.5  3.5 - 5.0 (g/dL)   ??? Globulin 4.4 (*) 2.0 - 4.0 (g/dL)   ??? A-G Ratio 0.8 (*) 1.1 - 2.2 ( )   METABOLIC PANEL, COMPREHENSIVE    Collection Time    09/29/09 11:00 PM   Component Value Range   ??? Sodium 128 (*) 136 - 145 (MMOL/L)   ??? Potassium 4.1  3.5 - 5.1 (MMOL/L)   ??? Chloride 87 (*) 97 - 108 (MMOL/L)   ??? CO2 26  21 - 32 (MMOL/L)   ??? Anion gap 15  5 - 15 (mmol/L)   ??? Glucose 728 (*) 65 - 100 (MG/DL)   ??? BUN 295 (*) 6 - 20 (MG/DL)   ??? Creatinine 3.6 (*) 0.6 - 1.3 (MG/DL)   ??? BUN/Creatinine ratio 28 (*) 12 - 20 ( )    ??? GFR est AA 22 (*) >60 (ml/min/1.71m2)   ??? GFR est non-AA 18 (*) >60 (ml/min/1.61m2)   ??? Calcium 8.5  8.5 - 10.1 (MG/DL)   ??? Bilirubin, total 0.4  0.2 - 1.0 (MG/DL)   ??? ALT 30  12 - 78 (  U/L)   ??? AST 70 (*) 15 - 37 (U/L)   ??? Alk. phosphatase 91  50 - 136 (U/L)   ??? Protein, total 7.8  6.4 - 8.2 (g/dL)   ??? Albumin 3.6  3.5 - 5.0 (g/dL)   ??? Globulin 4.2 (*) 2.0 - 4.0 (g/dL)   ??? A-G Ratio 0.9 (*) 1.1 - 2.2 ( )   METABOLIC PANEL, BASIC    Collection Time    09/30/09 12:08 AM   Component Value Range   ??? Sodium 132 (*) 136 - 145 (MMOL/L)   ??? Potassium 4.1  3.5 - 5.1 (MMOL/L)   ??? Chloride 88 (*) 97 - 108 (MMOL/L)   ??? CO2 28  21 - 32 (MMOL/L)   ??? Anion gap 16 (*) 5 - 15 (mmol/L)   ??? Glucose 666 (*) 65 - 100 (MG/DL)   ??? BUN 96 (*) 6 - 20 (MG/DL)   ??? Creatinine 3.6 (*) 0.6 - 1.3 (MG/DL)   ??? BUN/Creatinine ratio 27 (*) 12 - 20 ( )   ??? GFR est AA 22 (*) >60 (ml/min/1.30m2)   ??? GFR est non-AA 18 (*) >60 (ml/min/1.66m2)   ??? Calcium 8.4 (*) 8.5 - 10.1 (MG/DL)   METABOLIC PANEL, BASIC    Collection Time    09/30/09  1:30 AM   Component Value Range   ??? Sodium 133 (*) 136 - 145 (MMOL/L)   ??? Potassium 4.0  3.5 - 5.1 (MMOL/L)   ??? Chloride 90 (*) 97 - 108 (MMOL/L)   ??? CO2 31  21 - 32 (MMOL/L)   ??? Anion gap 12  5 - 15 (mmol/L)   ??? Glucose 538 (*) 65 - 100 (MG/DL)   ??? BUN 95 (*) 6 - 20 (MG/DL)   ??? Creatinine 3.5 (*) 0.6 - 1.3 (MG/DL)   ??? BUN/Creatinine ratio 27 (*) 12 - 20 ( )   ??? GFR est AA 22 (*) >60 (ml/min/1.59m2)   ??? GFR est non-AA 19 (*) >60 (ml/min/1.94m2)   ??? Calcium 8.4 (*) 8.5 - 10.1 (MG/DL)   GLUCOSE, POC    Collection Time    09/30/09  2:33 AM   Component Value Range   ??? POC GLUCOSE 475 (*) 75 - 110 (mg/dL)   GLUCOSE, POC    Collection Time    09/30/09  3:36 AM   Component Value Range   ??? POC GLUCOSE 343 (*) 75 - 110 (mg/dL)   METABOLIC PANEL, BASIC    Collection Time    09/30/09  4:06 AM   Component Value Range   ??? Sodium 137  136 - 145 (MMOL/L)   ??? Potassium 3.8  3.5 - 5.1 (MMOL/L)   ??? Chloride 94 (*) 97 - 108 (MMOL/L)    ??? CO2 31  21 - 32 (MMOL/L)   ??? Anion gap 12  5 - 15 (mmol/L)   ??? Glucose 274 (*) 65 - 100 (MG/DL)   ??? BUN 88 (*) 6 - 20 (MG/DL)   ??? Creatinine 3.2 (*) 0.6 - 1.3 (MG/DL)   ??? BUN/Creatinine ratio 28 (*) 12 - 20 ( )   ??? GFR est AA 25 (*) >60 (ml/min/1.67m2)   ??? GFR est non-AA 21 (*) >60 (ml/min/1.53m2)   ??? Calcium 8.5  8.5 - 10.1 (MG/DL)   MAGNESIUM    Collection Time    09/30/09  4:06 AM   Component Value Range   ??? Magnesium 2.3  1.6 - 2.4 (MG/DL)   PHOSPHORUS  Collection Time    09/30/09  4:06 AM   Component Value Range   ??? Phosphorus 1.9 (*) 2.5 - 4.9 (MG/DL)   CBC W/O DIFF    Collection Time    09/30/09  4:06 AM   Component Value Range   ??? WBC 9.7  4.1 - 11.1 (K/uL)   ??? RBC 2.86 (*) 4.10 - 5.70 (M/uL)   ??? HGB 8.3 (*) 12.1 - 17.0 (g/dL)   ??? HCT 23.9 (*) 36.6 - 50.3 (%)   ??? MCV 83.6  80.0 - 99.0 (FL)   ??? MCH 29.0  26.0 - 34.0 (PG)   ??? MCHC 34.7  30.0 - 36.5 (g/dL)   ??? RDW 15.3 (*) 11.5 - 14.5 (%)   ??? PLATELET 165  150 - 400 (K/uL)   CK W/ CKMB & INDEX    Collection Time    09/30/09  4:06 AM   Component Value Range   ??? CK 942 (*) 39 - 308 (U/L)   ??? CK - MB 110.9 (*) 0.5 - 3.6 (NG/ML)   ??? CK-MB Index 11.8 (*) 0 - 2.5 ( )   TROPONIN I    Collection Time    09/30/09  4:06 AM   Component Value Range   ??? Troponin-I, Qt. 32.04 (*) <0.05 (ng/mL)   GLUCOSE, POC    Collection Time    09/30/09  4:37 AM   Component Value Range   ??? POC GLUCOSE 254 (*) 75 - 110 (mg/dL)   GLUCOSE, POC    Collection Time    09/30/09  5:40 AM   Component Value Range   ??? POC GLUCOSE 165 (*) 75 - 110 (mg/dL)   GLUCOSE, POC    Collection Time    09/30/09  6:46 AM   Component Value Range   ??? POC GLUCOSE 99  75 - 110 (mg/dL)   GLUCOSE, POC    Collection Time    09/30/09  7:54 AM   Component Value Range   ??? POC GLUCOSE 46 (*) 75 - 110 (mg/dL)           Data Review: current meds, labs,recent radiology, intake/output/weight and problem list reviewed

## 2009-09-30 NOTE — Progress Notes (Signed)
Echocardiogram is completed. Final report is to follow.

## 2009-09-30 NOTE — Progress Notes (Signed)
Bedside and Verbal shift change report given to Ana RN (oncoming nurse) by Lulie Hurd RN (offgoing nurse).  Report given with SBAR, Kardex, ED Summary, Intake/Output, MAR and Recent Results.

## 2009-10-01 LAB — GLUCOSE, POC
Glucose (POC): 196 mg/dL — ABNORMAL HIGH (ref 75–110)
Glucose (POC): 289 mg/dL — ABNORMAL HIGH (ref 75–110)
Glucose (POC): 301 mg/dL — ABNORMAL HIGH (ref 75–110)
Glucose (POC): 350 mg/dL — ABNORMAL HIGH (ref 75–110)
Glucose (POC): 309 mg/dL — ABNORMAL HIGH (ref 75–110)
Glucose (POC): 87 mg/dL (ref 75–110)

## 2009-10-01 LAB — METABOLIC PANEL, BASIC
Anion gap: 7 mmol/L (ref 5–15)
BUN/Creatinine ratio: 36 — ABNORMAL HIGH (ref 12–20)
BUN: 83 MG/DL — ABNORMAL HIGH (ref 6–20)
CO2: 29 MMOL/L (ref 21–32)
Calcium: 7.9 MG/DL — ABNORMAL LOW (ref 8.5–10.1)
Chloride: 100 MMOL/L (ref 97–108)
Creatinine: 2.3 MG/DL — ABNORMAL HIGH (ref 0.6–1.3)
GFR est AA: 36 mL/min/{1.73_m2} — ABNORMAL LOW (ref >60)
GFR est non-AA: 30 mL/min/{1.73_m2} — ABNORMAL LOW (ref >60)
Glucose: 103 MG/DL — ABNORMAL HIGH (ref 65–100)
Potassium: 3.5 MMOL/L (ref 3.5–5.1)
Sodium: 136 MMOL/L (ref 136–145)

## 2009-10-01 LAB — PHOSPHORUS: Phosphorus: 1.6 MG/DL — ABNORMAL LOW (ref 2.5–4.9)

## 2009-10-01 LAB — PTT
aPTT: 33.6 s — ABNORMAL HIGH (ref 24.0–33.0)
aPTT: >130 s — CR (ref 24.0–33.0)
aPTT: 39.5 s — ABNORMAL HIGH (ref 24.0–33.0)

## 2009-10-01 LAB — CBC W/O DIFF
HCT: 23.4 % — ABNORMAL LOW (ref 36.6–50.3)
HGB: 8.1 g/dL — ABNORMAL LOW (ref 12.1–17.0)
MCH: 29.3 PG (ref 26.0–34.0)
MCHC: 34.6 g/dL (ref 30.0–36.5)
MCV: 84.8 FL (ref 80.0–99.0)
PLATELET: 142 10*3/uL — ABNORMAL LOW (ref 150–400)
RBC: 2.76 M/uL — ABNORMAL LOW (ref 4.10–5.70)
RDW: 15.7 % — ABNORMAL HIGH (ref 11.5–14.5)
WBC: 11 10*3/uL (ref 4.1–11.1)

## 2009-10-01 LAB — CK W/ CKMB & INDEX
CK - MB: 32.4 NG/ML — ABNORMAL HIGH (ref 0.5–3.6)
CK-MB Index: 4.9 — ABNORMAL HIGH (ref 0–2.5)
CK: 659 U/L — ABNORMAL HIGH (ref 39–308)

## 2009-10-01 LAB — CULTURE, MRSA

## 2009-10-01 LAB — MAGNESIUM: Magnesium: 2.1 MG/DL (ref 1.6–2.4)

## 2009-10-01 LAB — TROPONIN I: Troponin-I, Qt.: 19.16 ng/mL — ABNORMAL HIGH (ref <0.05)

## 2009-10-01 MED ORDER — INSULIN ASPART 100 UNIT/ML INJECTION
100 unit/mL | Freq: Three times a day (TID) | SUBCUTANEOUS | Status: DC
Start: 2009-10-01 — End: 2009-10-03
  Administered 2009-10-01 – 2009-10-02 (×5): via SUBCUTANEOUS

## 2009-10-01 MED ORDER — METOPROLOL TARTRATE 50 MG TAB
50 mg | Freq: Two times a day (BID) | ORAL | Status: DC
Start: 2009-10-01 — End: 2009-10-05
  Administered 2009-10-01 – 2009-10-04 (×9): via ORAL

## 2009-10-01 MED FILL — FISH OIL 340 MG-1,000 MG CAPSULE: 340-1000 mg | ORAL | Qty: 2

## 2009-10-01 MED FILL — METOPROLOL TARTRATE 25 MG TAB: 25 mg | ORAL | Qty: 1

## 2009-10-01 MED FILL — INSULIN ASPART 100 UNIT/ML INJECTION: 100 unit/mL | SUBCUTANEOUS | Qty: 1

## 2009-10-01 MED FILL — ASPIRIN 81 MG CHEWABLE TAB: 81 mg | ORAL | Qty: 2

## 2009-10-01 MED FILL — DOK 100 MG CAPSULE: 100 mg | ORAL | Qty: 1

## 2009-10-01 MED FILL — METOPROLOL TARTRATE 50 MG TAB: 50 mg | ORAL | Qty: 1

## 2009-10-01 MED FILL — LEVOTHYROXINE 150 MCG TAB: 150 mcg | ORAL | Qty: 1

## 2009-10-01 MED FILL — TERAZOSIN 5 MG CAP: 5 mg | ORAL | Qty: 2

## 2009-10-01 MED FILL — NITRO-BID 2 % TRANSDERMAL OINTMENT: 2 % | TRANSDERMAL | Qty: 1

## 2009-10-01 MED FILL — INSULIN GLARGINE 100 UNIT/ML INJECTION: 100 unit/mL | SUBCUTANEOUS | Qty: 0.3

## 2009-10-01 MED FILL — FAMOTIDINE 20 MG TAB: 20 mg | ORAL | Qty: 1

## 2009-10-01 MED FILL — HEPARIN (PORCINE) 5,000 UNIT/ML IJ SOLN: 5000 unit/mL | INTRAMUSCULAR | Qty: 1

## 2009-10-01 MED FILL — CRESTOR 10 MG TABLET: 10 mg | ORAL | Qty: 2

## 2009-10-01 MED FILL — HEPARIN (PORCINE) IN D5W 25,000 UNIT/250 ML IV: 25000 unit/250 mL(100 unit/mL) | INTRAVENOUS | Qty: 250

## 2009-10-01 MED FILL — PLAVIX 75 MG TABLET: 75 mg | ORAL | Qty: 1

## 2009-10-01 NOTE — Progress Notes (Signed)
TRANSFER - IN REPORT:    Verbal report received from Gar Ponto, RN(name) on Travis [name]  being received from ICU(unit) for routine progression of care      Report consisted of patient???s Situation, Background, Assessment and   Recommendations(SBAR).     Information from the following report(s) SBAR, Kardex, Intake/Output, MAR and Recent Results was reviewed with the receiving nurse.    Opportunity for questions and clarification was provided.      Assessment completed upon patient???s arrival to unit and care assumed.

## 2009-10-01 NOTE — Progress Notes (Signed)
Bedside and Verbal shift change report given to Elon Jester Drinkard RN (oncoming nurse) by Carmie End RN (offgoing nurse).  Report given with SBAR, Kardex and MAR. Pt. Is A+Ox4 with no c/o pain. Pt. Stable and is as reported. VS WNL. PTT drawn and sent.

## 2009-10-01 NOTE — Progress Notes (Signed)
TRANSFER - OUT REPORT:    Verbal report given to Travis Gates RN(name) on Travis [name]  being transferred to [facility](unit) for routine progression of care       Report consisted of patient???s Situation, Background, Assessment and   Recommendations(SBAR).     Information from the following report(s) SBAR, Kardex and MAR was reviewed with the receiving nurse.    Opportunity for questions and clarification was provided.  Pt. Stable. No signs of any distress.

## 2009-10-01 NOTE — Progress Notes (Signed)
Critical Result Notification    Received and verbally repeated the following test results Pro time, >130  from [employer] [place] (NAME OF TECHNICIAN).     Will re-draw Ptt and then follow protocol with results.      Julieanne Manson, RN

## 2009-10-01 NOTE — Progress Notes (Signed)
Bedside and Verbal shift change report given to Clara B, RN (Cabin crew) by Sander Nephew (offgoing nurse).  Report given with SBAR, Kardex, MAR and Recent Results.

## 2009-10-01 NOTE — Progress Notes (Signed)
Critical Result Notification    Received and verbally repeated the following test results Protime 130 from [facility] (NAME OF TECHNICIAN).     Dr. Waymon Amato OF PROVIDER) was notified and provided a verbal readback of the results listed above on 10/01/09(DATE) at 1000(TIME). Orders were not received at this time.    Additional comments:Pt. On Heparin protocol  Heparin drip turned off will re-check ptt in 1200.     Julieanne Manson, RN

## 2009-10-01 NOTE — Progress Notes (Signed)
[facility] [facility]  [address] [place], [place] [address]  [phone]      Medical Progress Note      NAME: Travis [name]   DOB:  Apr 10, 1941  MRM:  [id number]    Date/Time: 10/01/2009  8:23 AM       Subjective:     Chief Complaint:  Patient was seen and examined by me.  Chart reviewed.  Denied chest pain this AM.  Otherwise doing well.         Objective:       Vitals:       Last 24hrs VS reviewed since prior progress note. Most recent are:    Visit Vitals   Item Reading   ??? BP 151/52   ??? Pulse 108   ??? Temp 98.7 ??F (37.1 ??C)   ??? Resp 15   ??? Ht 5\' 6"    ??? Wt 166 lb   ??? BMI 26.79 kg/m2   ??? SpO2 95%         SpO2 Readings from Last 6 Encounters:   10/01/09 95%   03/31/09 94%              Intake/Output Summary (Last 24 hours) at 10/01/09 2130  Last data filed at 10/01/09 0645   Gross per 24 hour   Intake 3721.05 ml   Output   2250 ml   Net 1471.05 ml          Exam:     Physical Exam:    Gen: Well-developed, well-nourished, NAD  HEENT: Pink conjunctivae, PERRL, hearing intact to voice, dry mucous membranes   Neck: Supple, without masses, thyroid non-tender   Resp: No accessory muscle use, clear breath sounds without wheezes rales or rhonchi   Card: 2/6 SEM, normal S1, S2 without thrills, bruits or peripheral edema   Abd: Soft, non-tender, non-distended, normoactive bowel sounds are present, no palpable organomegaly and no detectable hernias   Lymph: No cervical adenopathy   Musc: Back pain to palpation of left shoulder blade   Skin: No rashes or ulcers, skin turgor is good   Neuro: Cranial nerves 3-12 are grossly intact, follows commands appropriately   Psych: Alert with good insight. Oriented to person, place, and time    Telemetry reviewed:   paced    Medications Reviewed: (see below)    Lab Data Reviewed: (see below)    ______________________________________________________________________    Medications:     Current facility-administered medications   Medication Dose Route Frequency    ??? insulin aspart (NOVOLOG) 100 unit/mL injection 6 Units  6 Units SubCUTAneous TIDAC   ??? insulin glargine (LANTUS) injection 30 Units  30 Units SubCUTAneous DAILY   ??? aspirin chewable tablet 162 mg  162 mg Oral DAILY   ??? heparin 25,000 units in D5W 250 ml infusion  12-25 Units/kg/hr IntraVENous TITRATE   ??? heparin (porcine) injection 5,000 Units  5,000 Units IntraVENous ONCE   ??? heparin (porcine) injection 4,000 Units  4,000 Units IntraVENous PRN   ??? heparin (porcine) injection 2,000 Units  2,000 Units IntraVENous PRN   ??? dextrose (D50W) injection Syrg 25 g  25 g IntraVENous PRN   ??? insulin aspart (NOVOLOG)   SubCUTAneous AC&HS   ??? nitroglycerin (NITROBID) 2 % ointment 1 Inch  1 Inch Topical BID   ??? clopidogrel (PLAVIX) tablet 75 mg  75 mg Oral DAILY   ??? levothyroxine (SYNTHROID) tablet 150 mcg  150 mcg Oral ACB   ???  nitroglycerin (NITROSTAT) tablet 0.4 mg  0.4 mg SubLINGual Q5MIN PRN   ??? fish oil-omega-3 fatty acids 340-1,000 mg capsule 2 Cap  2 Cap Oral BID   ??? rosuvastatin (CRESTOR) tablet 20 mg  20 mg Oral DAILY   ??? terazosin (HYTRIN) capsule 10 mg  10 mg Oral QHS   ??? 0.9% sodium chloride infusion  125 mL/hr IntraVENous CONTINUOUS   ??? docusate sodium (COLACE) capsule 100 mg  100 mg Oral BID   ??? bisacodyl (DULCOLAX) tablet 5 mg  5 mg Oral DAILY PRN   ??? bisacodyl (DULCOLAX) suppository 10 mg  10 mg Rectal DAILY PRN   ??? oxycodone-acetaminophen (PERCOCET) 5-325 mg per tablet 2 Tab  2 Tab Oral Q6H PRN   ??? morphine injection 2 mg  2 mg IntraVENous Q4H PRN   ??? metoprolol (LOPRESSOR) tablet 25 mg  25 mg Oral BID   ??? glucose chewable tablet 12 g  12 g Oral PRN   ??? glucagon (GLUCAGEN) injection 1 mg  1 mg IntraMUSCular PRN   ??? famotidine (PEPCID) tablet 20 mg  20 mg Oral DAILY              Lab Review:     Recent Labs   Basename 10/01/09 0047 09/30/09 0406 09/29/09 1600   ??? WBC 11.0 9.7 10.4   ??? HGB 8.1* 8.3* 8.5*   ??? HCT 23.4* 23.9* 27.2*   ??? PLT 142* 165 168         Recent Labs    Basename 10/01/09 0047 09/30/09 1600 09/30/09 0820 09/30/09 0406 09/29/09 2300 09/29/09 1935 09/29/09 1600   ??? NA 136 133* 137 -- -- -- --   ??? K 3.5 3.9 3.5 -- -- -- --   ??? CL 100 94* 98 -- -- -- --   ??? CO2 29 29 32 -- -- -- --   ??? GLU 103* 297* 86 -- -- -- --   ??? BUN 83* 83* 93* -- -- -- --   ??? CREA 2.3* 2.7* 2.9* -- -- -- --   ??? CA 7.9* 7.9* 8.6 -- -- -- --   ??? MG 2.1 -- -- 2.3 -- -- --   ??? PHOS 1.6* -- -- 1.9* -- -- --   ??? ALB -- -- -- -- 3.6 3.5 3.8   ??? TBIL -- -- -- -- 0.4 0.6 0.6   ??? SGOT -- -- -- -- 70* 31 28   ??? INR -- -- -- -- -- -- --         Lab Results   Component Value Date/Time    POC GLUCOSE 350 10/01/2009  7:50 AM    POC GLUCOSE 301 10/01/2009  6:01 AM    POC GLUCOSE 289 10/01/2009  5:59 AM    POC GLUCOSE 196 09/30/2009  9:28 PM    POC GLUCOSE 372 09/30/2009  4:35 PM              Assessment / Plan:     69 yo AAM hx of CAD, DM, CKD 3, AV block with pacer, who presented with chest and back pain. Also found to be in DKA and has a NSTEMI    1) DKA (diabetic ketoacidosis): due to medical non-compliance and NSTEMI. Stopped insulin x2 days PTA for "feeling bad." Glucose and AG improved with insulin gtt.  Cont home Lantus.  Will add aspart 6u AC-tid meals.   CXR and U/A neg for infection.     2) NSTEMI/CAD: Hx of  multiple MI.  S/p cath x2 with stents this year.  Cath showed diffused CAD.  Cardiology consulted, will cont heparin gtt.  Echo showed EF 60% with hypokinesis of inferior and basal lateral region.  Trop peaked at 47.1.   Cont. ASA, plavix, statin, BB.  Cardiology to decide whether or not to re-cath patient.       3) Acute kidney failure: due to DKA and dehydration. Has CKD 3. Slowly improvign.  Hold bumex and lisinopril. Will Cont. IVF, monitor     4) Chronic kidney disease, stage III (moderate): baseline Cr from 1.5 to 2.5. Cont. To monitor     5) Hyperkalemia (09/29/2009): from acidosis. Improved with insulin     6) Hyposmolality and/or hyponatremia (09/29/2009): due to hyperglycemia and hypovolemia. Improved with IVF.  Cont. To monitor, cont IVF     7) Anemia, unspecified (09/29/2009): chronic, baseline Hgb 8-10. Monitor     8) Chest pain, unspecified (09/29/2009): due to NSTEMI. S/p multiple cath and cardiology evaluations. Cont nitro, ASA, plavix, BB, statin. Cardiology following.     9) Back pain (09/29/2009): combination of NSTEMI and MSK. Pain control prn     Full Code    Total time spent with patient: 58 Minutes                  Care Plan discussed with: Patient    Discussed:  Care Plan    Prophylaxis:  Hep SQ    Disposition:  Home w/Family           ___________________________________________________    Attending Physician: Twanna Hy. Joon Pohle, MD

## 2009-10-01 NOTE — Progress Notes (Signed)
10/01/2009     Admit Date: 09/29/2009    Admit Diagnosis: DKA (DIABETIC KETOACIDOSIS)    Patient Active Hospital Problem List:  *DKA (diabetic ketoacidosis) (09/29/2009)    Acute myocardial infarction, unspecified site, initial episode of care (09/30/2009)    Coronary atherosclerosis of native coronary artery (09/29/2009)    Acute kidney failure, unspecified (09/29/2009)    Chronic kidney disease, stage III (moderate) (09/29/2009)    Hyperkalemia (09/29/2009)    Hyposmolality and/or hyponatremia (09/29/2009)    Anemia, unspecified (09/29/2009)    Back pain (09/29/2009)      Assessment/Plan:  Uncomplicated MI, preserved LVEF. Optimized beta blocker dose, stop nitrates. Consider cath when stability of renal dysfunction established. Transfer to tele.      Subjective:  Denies recurrent chest pain. No sob at rest. Eating fine. Patient denies any exertional chest pain, dyspnea, palpitations, syncope, orthopnea, edema or paroxysmal nocturnal dyspnea.            [name], MD    Objective:   BP 151/52   Pulse 108   Temp 98.7 ??F (37.1 ??C)   Resp 15   Ht 5\' 6"  (1.676 m)   Wt 166 lb (75.297 kg)   BMI 26.79 kg/m2   SpO2 95%     Physical Exam:  Neck: supple, symmetrical, trachea midline, no adenopathy, thyroid: not enlarged, symmetric, no tenderness/mass/nodules, no carotid bruit and no JVD  Heart: normal apical impulse, S1, S2 normal, systolic murmur: systolic ejection 2/6, crescendo, decrescendo at lower left sternal border  Lungs: clear to auscultation bilaterally  Abdomen: soft, non-tender. Bowel sounds normal. No masses,  no organomegaly  Extremities: extremities normal, atraumatic, no cyanosis or edema      Labs:    Recent Labs   [facility] 10/01/09 0047   ??? CPK 659*   ??? CPKMB 32.4*   ??? TROIQ 19.16*       Recent Labs   [facility] 10/01/09 0047   ??? NA 136   ??? K 3.5   ??? CL 100   ??? BUN 83*   ??? CREA 2.3*   ??? GLU 103*   ??? PHOS 1.6*   ??? CA 7.9*       Recent Labs   [facility] 10/01/09 0047   ??? WBC 11.0   ??? HGB 8.1*   ??? HCT 23.4*    ??? PLT 142*         No results found for this basename: TGL:1,CHOL:1,HDLC:1,LDLC:1, HBA1C:1 in the last 72 hours    Telemetry: AV paced at 100     Cardiac testing  Echo - EF 55%, basal inferior and inferolateral HK  EKG - none today

## 2009-10-02 LAB — METABOLIC PANEL, BASIC
Anion gap: 7 mmol/L (ref 5–15)
BUN/Creatinine ratio: 31 — ABNORMAL HIGH (ref 12–20)
BUN: 55 MG/DL — ABNORMAL HIGH (ref 6–20)
CO2: 30 MMOL/L (ref 21–32)
Calcium: 8 MG/DL — ABNORMAL LOW (ref 8.5–10.1)
Chloride: 100 MMOL/L (ref 97–108)
Creatinine: 1.8 MG/DL — ABNORMAL HIGH (ref 0.6–1.3)
GFR est AA: 48 mL/min/{1.73_m2} — ABNORMAL LOW (ref >60)
GFR est non-AA: 40 mL/min/{1.73_m2} — ABNORMAL LOW (ref >60)
Glucose: 230 MG/DL — ABNORMAL HIGH (ref 65–100)
Potassium: 3.8 MMOL/L (ref 3.5–5.1)
Sodium: 137 MMOL/L (ref 136–145)

## 2009-10-02 LAB — IRON PROFILE
Iron % saturation: 35 % (ref 20–50)
Iron: 66 ug/dL (ref 35–150)
TIBC: 188 ug/dL — ABNORMAL LOW (ref 250–450)

## 2009-10-02 LAB — GLUCOSE, POC
Glucose (POC): 112 mg/dL — ABNORMAL HIGH (ref 75–110)
Glucose (POC): 41 mg/dL — CL (ref 75–110)
Glucose (POC): 46 mg/dL — CL (ref 75–110)
Glucose (POC): 60 mg/dL — ABNORMAL LOW (ref 75–110)
Glucose (POC): 218 mg/dL — ABNORMAL HIGH (ref 75–110)
Glucose (POC): >600 mg/dL — CR (ref 75–110)
Glucose (POC): >600 mg/dL — CR (ref 75–110)
Glucose (POC): >600 mg/dL — CR (ref 75–110)
Glucose (POC): >600 mg/dL — CR (ref 75–110)
Glucose (POC): >600 mg/dL — CR (ref 75–110)
Glucose (POC): 541 mg/dL — ABNORMAL HIGH (ref 75–110)
Glucose (POC): 166 mg/dL — ABNORMAL HIGH (ref 75–110)

## 2009-10-02 LAB — EKG, 12 LEAD, INITIAL
Atrial Rate: 86 {beats}/min
Calculated P Axis: 51 degrees
Calculated R Axis: 43 degrees
Calculated T Axis: 115 degrees
Diagnosis: NORMAL
P-R Interval: 178 ms
Q-T Interval: 432 ms
QRS Duration: 90 ms
QTC Calculation (Bezet): 516 ms
Ventricular Rate: 86 {beats}/min

## 2009-10-02 LAB — CBC W/O DIFF
HCT: 23.6 % — ABNORMAL LOW (ref 36.6–50.3)
HGB: 7.7 g/dL — ABNORMAL LOW (ref 12.1–17.0)
MCH: 28.3 PG (ref 26.0–34.0)
MCHC: 32.6 g/dL (ref 30.0–36.5)
MCV: 86.8 FL (ref 80.0–99.0)
PLATELET: 130 10*3/uL — ABNORMAL LOW (ref 150–400)
RBC: 2.72 M/uL — ABNORMAL LOW (ref 4.10–5.70)
RDW: 16 % — ABNORMAL HIGH (ref 11.5–14.5)
WBC: 7.8 10*3/uL (ref 4.1–11.1)

## 2009-10-02 LAB — PTT
aPTT: 94.9 s — CR (ref 24.0–33.0)
aPTT: 96.6 s — CR (ref 24.0–33.0)
aPTT: 64.8 s — ABNORMAL HIGH (ref 24.0–33.0)

## 2009-10-02 LAB — MAGNESIUM: Magnesium: 2.1 MG/DL (ref 1.6–2.4)

## 2009-10-02 LAB — FERRITIN: Ferritin: 498 NG/ML — ABNORMAL HIGH (ref 26–388)

## 2009-10-02 LAB — PHOSPHORUS: Phosphorus: 1.8 MG/DL — ABNORMAL LOW (ref 2.5–4.9)

## 2009-10-02 MED ORDER — EPOETIN ALFA 10,000 UNIT/ML IJ SOLN
10000 unit/mL | INTRAMUSCULAR | Status: DC
Start: 2009-10-02 — End: 2009-10-15
  Administered 2009-10-02 – 2009-10-09 (×2): via SUBCUTANEOUS

## 2009-10-02 MED ORDER — PEG 3350-ELECTROLYTES ORAL SOLUTION
Freq: Once | ORAL | Status: AC
Start: 2009-10-02 — End: 2009-10-02
  Administered 2009-10-02: 20:00:00 via ORAL

## 2009-10-02 MED ORDER — BISACODYL 5 MG TAB, DELAYED RELEASE
5 mg | ORAL | Status: AC
Start: 2009-10-02 — End: 2009-10-02
  Administered 2009-10-02: 19:00:00 via ORAL

## 2009-10-02 MED ORDER — SODIUM CHLORIDE 0.9 % IV
INTRAVENOUS | Status: DC
Start: 2009-10-02 — End: 2009-10-04
  Administered 2009-10-02: 18:00:00 via INTRAVENOUS

## 2009-10-02 MED ORDER — POTASSIUM & SODIUM PHOSPHATES 280 MG-160 MG-250 MG ORAL POWDER PACKET
280-160-250 mg | Freq: Once | ORAL | Status: AC
Start: 2009-10-02 — End: 2009-10-02
  Administered 2009-10-02: 14:00:00 via ORAL

## 2009-10-02 MED ORDER — PEG 3350-ELECTROLYTES 236 G-22.74 G-6.74 G-5.86 G ORAL SOLUTION
Freq: Once | ORAL | Status: DC
Start: 2009-10-02 — End: 2009-10-02

## 2009-10-02 MED ORDER — INSULIN GLARGINE 100 UNIT/ML INJECTION
100 unit/mL | Freq: Every day | SUBCUTANEOUS | Status: DC
Start: 2009-10-02 — End: 2009-10-03
  Administered 2009-10-02: 16:00:00 via SUBCUTANEOUS

## 2009-10-02 MED FILL — SODIUM CHLORIDE 0.9 % IV: INTRAVENOUS | Qty: 1000

## 2009-10-02 MED FILL — PHOS-NAK 280 MG-160 MG-250 MG ORAL POWDER PACKET: 280-160-250 mg | ORAL | Qty: 2

## 2009-10-02 MED FILL — INSULIN ASPART 100 UNIT/ML INJECTION: 100 unit/mL | SUBCUTANEOUS | Qty: 1

## 2009-10-02 MED FILL — FISH OIL 340 MG-1,000 MG CAPSULE: 340-1000 mg | ORAL | Qty: 2

## 2009-10-02 MED FILL — INSULIN GLARGINE 100 UNIT/ML INJECTION: 100 unit/mL | SUBCUTANEOUS | Qty: 0.15

## 2009-10-02 MED FILL — DOK 100 MG CAPSULE: 100 mg | ORAL | Qty: 1

## 2009-10-02 MED FILL — CRESTOR 10 MG TABLET: 10 mg | ORAL | Qty: 2

## 2009-10-02 MED FILL — GLUCOSE 4 GRAM CHEWABLE TAB: 4 gram | ORAL | Qty: 10

## 2009-10-02 MED FILL — PLAVIX 75 MG TABLET: 75 mg | ORAL | Qty: 1

## 2009-10-02 MED FILL — ASPIRIN 81 MG CHEWABLE TAB: 81 mg | ORAL | Qty: 2

## 2009-10-02 MED FILL — BISACODYL 5 MG TAB, DELAYED RELEASE: 5 mg | ORAL | Qty: 2

## 2009-10-02 MED FILL — LEVOTHYROXINE 150 MCG TAB: 150 mcg | ORAL | Qty: 1

## 2009-10-02 MED FILL — HEPARIN (PORCINE) IN D5W 25,000 UNIT/250 ML IV: 25000 unit/250 mL(100 unit/mL) | INTRAVENOUS | Qty: 250

## 2009-10-02 MED FILL — METOPROLOL TARTRATE 50 MG TAB: 50 mg | ORAL | Qty: 1

## 2009-10-02 MED FILL — FAMOTIDINE 20 MG TAB: 20 mg | ORAL | Qty: 1

## 2009-10-02 MED FILL — PEG 3350-ELECTROLYTES 236 G-22.74 G-6.74 G-5.86 G ORAL SOLUTION: ORAL | Qty: 4

## 2009-10-02 MED FILL — TERAZOSIN 5 MG CAP: 5 mg | ORAL | Qty: 2

## 2009-10-02 MED FILL — PROCRIT 10,000 UNIT/ML INJECTION SOLUTION: 10000 unit/mL | INTRAMUSCULAR | Qty: 1

## 2009-10-02 NOTE — Progress Notes (Signed)
DTC:  Pt known to DTC from previous admissions.  He actually had an appt. For outpt. Ed today  [facility].    BG > 700 on admission.  Pt had not taken insulin for 2 days.  Checks BG 3 - 4 x day at home.  Last A1C in Feb. 2011  9.0.  Creat. 2.7 down to 1.8.    Low BG last pm.  Lantus decreased today.    Pt sees Dr. Estella Husk - endo.  Wants to learn carb counting.  To begin prep for colonoscopy this pm.    Plan - provide info on carb counting prior to discharge,   Arrange outpt DTC follow up.    Travis [name] C Travis [name], CDE

## 2009-10-02 NOTE — Progress Notes (Signed)
Consult dictated.   1.AKI:secondary to volume depletion :osmotic diuresis with DKA in the presence of ACE and Loop   - also had low bp:70-80s:reduced EABV   -CR better with IVF,off loops and ace   2.CKD:2 to DM,HTN:Baseline cr 1.5-2.0 mg%   -await recovery   3.Hypotension:better   4.Proteinuria:2 to DM,needs RAS blockade once cr improves   5.Anemia:repeat Iron panel and start Epo   6.DM   I have dw him about contrast nephropathy.At this time cardiac issues take priority over the kidneys.If cath planned:will do IV,Mucomyst etc

## 2009-10-02 NOTE — Consults (Signed)
Consult dictated.  1.AKI:secondary to volume depletion :osmotic diuresis with DKA in the presence of ACE and Loop          - also had low bp:70-80s:reduced EABV          -CR better with IVF,off loops and ace  2.CKD:2 to DM,HTN:Baseline cr 1.5-2.0 mg%         -await recovery  3.Hypotension:better  4.Proteinuria:2 to DM,needs RAS blockade once cr improves  5.Anemia:repeat Iron panel and start Epo  6.DM  I have dw him about contrast nephropathy.At this time cardiac issues take priority over the kidneys.If cath planned:will do IV,Mucomyst etc.

## 2009-10-02 NOTE — Progress Notes (Signed)
[facility]  [address] [place], [place] [address]  [phone]      Medical Progress Note      NAME: Travis [name]   DOB:  1940-06-30  MRM:  [id number]    Date/Time: 10/02/2009  8:29 AM         Subjective:     Chief Complaint:  "i feel fine"    Doing well.  Denies chest pain or back pain.  Denies sob.  Tolerating diet.  +BM this AM, denies blood or melena.    ROS:  (bold if positive, if negative)      Tolerating Diet          Objective:       Vitals:          Last 24hrs VS reviewed since prior progress note. Most recent are:    Visit Vitals   Item Reading   ??? BP 157/89   ??? Pulse 89   ??? Temp 97.3 ??F (36.3 ??C)   ??? Resp 18   ??? Ht 5\' 6"    ??? Wt 166 lb   ??? BMI 26.79 kg/m2   ??? SpO2 99%       SpO2 Readings from Last 6 Encounters:   10/02/09 99%   03/31/09 94%            Intake/Output Summary (Last 24 hours) at 10/02/09 0829  Last data filed at 10/02/09 0650   Gross per 24 hour   Intake 1260.77 ml   Output   1375 ml   Net -114.23 ml            Exam:     Physical Exam:    Gen:  Well-developed, well-nourished, in no acute distress  HEENT:  Pink conjunctivae, PERRL, hearing intact to voice, moist mucous membranes  Neck:  Supple, without masses, thyroid non-tender  Resp:  No accessory muscle use, clear breath sounds without wheezes rales or rhonchi  Card:  Murmur present, normal S1, S2 without thrills, bruits or peripheral edema  Abd:  Soft, non-tender, non-distended, normoactive bowel sounds are present  Musc:  No cyanosis or clubbing  Skin:  No rashes or ulcers, skin turgor is good  Neuro:  Cranial nerves 3-12 are grossly intact, grip strength is 5/5 bilaterally and dorsi / plantarflexion is 5/5 bilaterally, follows commands appropriately  Psych:  Good insight, oriented to person, place and time, alert       Telemetry reviewed:   PVC with paced rhythm.    Medications Reviewed: (see below)    Lab Data Reviewed: (see below)     ______________________________________________________________________    Medications:     Current facility-administered medications   Medication Dose Route Frequency   ??? insulin aspart (NOVOLOG) 100 unit/mL injection 6 Units  6 Units SubCUTAneous TIDAC   ??? metoprolol (LOPRESSOR) tablet 50 mg  50 mg Oral BID   ??? insulin glargine (LANTUS) injection 30 Units  30 Units SubCUTAneous DAILY   ??? aspirin chewable tablet 162 mg  162 mg Oral DAILY   ??? heparin 25,000 units in D5W 250 ml infusion  12-25 Units/kg/hr IntraVENous TITRATE   ??? heparin (porcine) injection 4,000 Units  4,000 Units IntraVENous PRN   ??? heparin (porcine) injection 2,000 Units  2,000 Units IntraVENous PRN   ??? dextrose (D50W) injection Syrg 25 g  25 g IntraVENous PRN   ??? insulin aspart (NOVOLOG)   SubCUTAneous AC&HS   ??? clopidogrel (PLAVIX) tablet 75 mg  75 mg Oral DAILY   ??? levothyroxine (SYNTHROID) tablet 150 mcg  150 mcg Oral ACB   ??? nitroglycerin (NITROSTAT) tablet 0.4 mg  0.4 mg SubLINGual Q5MIN PRN   ??? fish oil-omega-3 fatty acids 340-1,000 mg capsule 2 Cap  2 Cap Oral BID   ??? rosuvastatin (CRESTOR) tablet 20 mg  20 mg Oral DAILY   ??? terazosin (HYTRIN) capsule 10 mg  10 mg Oral QHS   ??? docusate sodium (COLACE) capsule 100 mg  100 mg Oral BID   ??? bisacodyl (DULCOLAX) tablet 5 mg  5 mg Oral DAILY PRN   ??? bisacodyl (DULCOLAX) suppository 10 mg  10 mg Rectal DAILY PRN   ??? oxycodone-acetaminophen (PERCOCET) 5-325 mg per tablet 2 Tab  2 Tab Oral Q6H PRN   ??? morphine injection 2 mg  2 mg IntraVENous Q4H PRN   ??? glucose chewable tablet 12 g  12 g Oral PRN   ??? glucagon (GLUCAGEN) injection 1 mg  1 mg IntraMUSCular PRN   ??? famotidine (PEPCID) tablet 20 mg  20 mg Oral DAILY              Lab Review:     Recent Labs   Basename 10/02/09 0410 10/01/09 0047 09/30/09 0406   ??? WBC 7.8 11.0 9.7   ??? HGB 7.7* 8.1* 8.3*   ??? HCT 23.6* 23.4* 23.9*   ??? PLT 130* 142* 165       Recent Labs    Basename 10/02/09 0410 10/01/09 0047 09/30/09 1600 09/30/09 0406 09/29/09 2300 09/29/09 1935 09/29/09 1600   ??? NA 137 136 133* -- -- -- --   ??? K 3.8 3.5 3.9 -- -- -- --   ??? CL 100 100 94* -- -- -- --   ??? CO2 30 29 29  -- -- -- --   ??? GLU 230* 103* 297* -- -- -- --   ??? BUN 55* 83* 83* -- -- -- --   ??? CREA 1.8* 2.3* 2.7* -- -- -- --   ??? CA 8.0* 7.9* 7.9* -- -- -- --   ??? MG 2.1 2.1 -- 2.3 -- -- --   ??? PHOS 1.8* 1.6* -- 1.9* -- -- --   ??? ALB -- -- -- -- 3.6 3.5 3.8   ??? TBIL -- -- -- -- 0.4 0.6 0.6   ??? SGOT -- -- -- -- 70* 31 28   ??? INR -- -- -- -- -- -- --       Lab Results   Component Value Date/Time    POC GLUCOSE 218 10/02/2009  8:02 AM    POC GLUCOSE 112 10/01/2009 10:32 PM    POC GLUCOSE 60 10/01/2009 10:00 PM    POC GLUCOSE 41 10/01/2009  9:45 PM    POC GLUCOSE 46 10/01/2009  9:43 PM              Assessment / Plan:     Patient Active Hospital Problem List:  DM:  Had hypoglycemia last night.  Will decrease lantus dose for now.    Coronary atherosclerosis of native coronary artery / NSTEMI:  Currently asymptomatic.  Continuing with heparin gtt per cards.  Plans for cath once renal function stabilized.  On ASA / plavix, metoprolol.    Acute kidney failure, unspecified (09/29/2009):  Improving creatinine.  Will ask renal to see esp with plans for possible cath.    Anemia, unspecified (09/29/2009):  Transfuse 1 unit of prbc esp with NSTEMI.  hemecheck stools.  GI evaluation as patient reports was supposed to have seen GI outpt.    Hyperlipidemia:  On crestor plus fish oil.    Back pain (09/29/2009):  Resolved.    Hypophosphatemia:  Replete with PO phos.    Total time spent with patient: 16 Minutes                  Care Plan discussed with: Patient    Discussed:  Care Plan    Prophylaxis:  Hep and H2B/PPI    Disposition:  Home w/Family           ___________________________________________________    Attending Physician: Suzan Garibaldi, MD

## 2009-10-02 NOTE — Consults (Signed)
Name: Travis [name], Travis [name] Admitted: 09/29/2009  MR #: [id number] DOB: [birth year]  Account #: [id number] Age: 69  Consultant: [name], M.D. Location: [id number]     CONSULTATION REPORT    DATE OF CONSULTATION: 10/02/2009      ATTENDING PHYSICIAN: Khoi B. Do, MD    REASON FOR ADMISSION: Chest pain, back pain, and acute kidney injury,  chronic kidney disease.    HISTORY OF PRESENT ILLNESS: This 69 year old, African-American man whom we  have seen in the past here multiple times in the hospital because of acute  kidney injury, chronic kidney disease. Came back to the hospital with  similar complaints of midsternal chest pain. He was diagnosed with  non-ST-elevation MI. He has been having some back pain and chest pain, and  for some reason he stopped taking his insulin. When he came to the hospital  he was found to have a diabetic ketoacidosis with anion gap of 26 and a  blood sugar of more than 700.    He was started on insulin drip. He was given some IV fluids. When he came  to the hospital he was found to have acute kidney injury on the top of his  chronic kidney disease. His creatinine was 3.2 on arrival. With IV fluid  and hydration the creatinine came down to 1.8. His creatinine 2 months ago  was under 1.5 on discharge. He has a fluctuating creatinine between 1.5 and  3.2.    He initially denied vomiting and diarrhea to a few other doctors, but today  he tells me that before he came in to the hospital, he had some nausea,  vomiting and diarrhea for 1 day. He denies taking any NSAIDs. He did not  have any outpatient contrast procedures, and there were no new medications.  He was taking Bumex and ACE inhibitors before arriving to the hospital.  There have been a few hypotensions reported in the hospitalist as recently  as in the first few hours of arrival with a systolic in the 70s and 80s.  Blood pressure has improved significantly.     Because of his non-ST-elevation MI, there is a plan to possibly undergo  repeat catheterization. Cardiology, Dr. Welton Flakes, has evaluated the patient. He  is going to look at the films again and see if they can do something about  his left circumflex.    REVIEW OF SYSTEMS: Obtained completely and negative.    PAST MEDICAL HISTORY  1. Chronic kidney disease stage 3. Baseline creatinine 1.5 to 2.0.  2. Diabetes.  3. Hypertension.  4. Hypertensive diabetic nephrosclerosis.  5. Dyslipidemia.  6. Coronary artery disease, status post catheter left circumflex lesion.  7. Peripheral vascular disease.  8. Sleep apnea, on CPAP.  9. COPD.  10. Congestive heart failure.  11. Anemia.  12. Pulmonary hypertension with pulmonary artery systolic pressure 60 on  January 2011 echocardiogram.  13. In need of placement.    SOCIAL HISTORY: No smoking, alcohol, or drugs.    FAMILY HISTORY: Significant for hypertension.    PHYSICAL EXAMINATION  GENERAL: Alert and oriented x3. Not in acute distress, sitting up in a  chair.  VITAL SIGNS: Temperature 98, pulse 93, respirations 20, blood pressure  116/75, saturating 100%.  HEENT: Extraocular is intact. No JVD.  LUNG: Clear to auscultation. No crackles or wheezing.  HEART: S1, S2 present.  ABDOMEN: Soft, nontender, nondistended.  EXTREMITIES: Trace edema.  NEUROLOGIC: Grossly nonfocal.    CURRENT MEDICATIONS  1. Normal saline.  2. Aspirin.  3. Dulcolax.  4. Plavix.  5. Colace.  6. Pepcid.  7. Heparin.  8. Terazosin.  9. Crestor.    DIAGNOSTIC DATA: Laboratory data: Sodium 137, potassium 3.8, chloride 100,  bicarb 30, BUN 55, creatinine 1.8, calcium 8.0, phosphorus 1.8, magnesium  2.1, hemoglobin 10.7, WBC 7.8, platelet 130. Urine sodium not available at  this time.    Radiological data: Chest x-ray shows no acute cardiopulmonary process.    Ultrasound of the kidney reviewed from the past. No hydronephrosis.    IMPRESSION  1. Acute kidney injury most likely secondary to volume depletion, secondary   to ________ in the presence of ________.  2. Chronic kidney disease stage 3. Baseline creatinine 1.5 to 2.0.  Secondary to diabetes and hypertension.  3. Diabetes.  4. Hypertension.  5. Hypertensive diabetic nephrosclerosis.  6. Anemia.  7. Obesity.  8. Sleep apnea, on CPAP.  9. Coronary artery disease.  10. Congestive heart failure.  11. Pulmonary hypertension.    PLAN  1. History reviewed with acute kidney injury on top of the chronic kidney  disease. He has recurrent acute kidney injury in the past. He had  hypotension and blood pressure was in 70s and 80s. He has a decreased  effective arterial blood volume. He was getting Bumex and ACE inhibitor. He  was having some nausea, vomiting, diarrhea, with poor appetite. He had  hyperglycemia and diabetic ketoacidosis. This might be a residual of  diuretics. All of those things have resulted in acute kidney injury with a  creatinine increasing to 3.2. After holding the diuretics and ACE inhibitor  the creatinine has come down to 1.8 today. Dr. Park Breed is going to review the  films again and decide about the cardiac catheterization. I discussed with  him about the risk of contrast nephropathy and needing temporary dialysis.  We will await ________ to proceed with catheterization. If he is having  cardiac catheterization prior we cannot do IV fluids and Mucomyst. If  cardiac catheterization is not planned or creatinine stays stable after the  catheterization, will continue the ACE inhibitor for further cardiac and  renal protection.  2. Anemia. The anemia can happen also because of the chronic kidney  disease. I am going to get some iron stores. I am going to start him on  Procrit. We will continue Procrit outpatient.  3. Check iron.  4. Procrit.  5. ACE inhibitor if no cath is planned, or if the catheterization is  planned, hold the ACE inhibitor until after the cardiac catheterization to  make sure the creatinine stable.  6. Will follow him in the CKD clinic.     Thanks for asking me to participate in this patient's care. We will  follow.        Reviewed on 10/02/2009 3:25 PM            [name], M.D.    cc: [name], M.D.        ST/wmx; D: 10/02/2009 1:26 P; T: 10/02/2009 2:55 P; DOC# [id number]; Job#  [id number]

## 2009-10-02 NOTE — Consults (Signed)
[facility]  [address] [place], [place] [address]  [phone]                GASTROENTEROLOGY CONSULTATION NOTE        NAME:  Travis [name]   DOB:   August 29, 1940   MRN:   [id number]       Referring Physician: Freeman Caldron     Consult Date: 10/02/2009 12:14 PM    Chief Complaint: anemia   History of Present Illness:  Patient is a 69 y.o. who is seen in consultation at the request of Dr. Selena Batten for anemia and need for Coumadin.  Patient is a 69 yo AAM hx of CAD, CKD 3, AV block with pacer, DM, COPD, HTN, who presented to the ED with chest pain and back pain x2 days. The patient stated that his back pain is located at the left shoulder blade, sharp, 8/10, worse with movement. Chest pain is midsternal, sharp, not associated with SOB, Nausea, vomiting, or diaphoresis. Due to the pain, the patient has stopped his insulin for the past 2 days (normally takes Lantus 30u and SSI novolog). In the ED, found to have a glucose of 700 with AG of 26. Also had a Trop of 0.14. Per computer record, the patient has had 2 heart cath this past year, which showed diffused CAD. In the ED, insulin gtt was started. Cardiology was consulted for elevated Trop and he's had recurrent NSTEMI's the past few months. He has a chronic LCX lesion which has not been amenable to PCI (noted the past 3 caths).    He had colonoscopy 5 years ago, cannot recall who did it, and was found to have colon polyps and is actually due for repeat now.  He denies BRBPR, red or black stools.     PMH:  Past Medical History   Diagnosis Date   ??? CAD (coronary artery disease)      see problem list   ??? HTN (hypertension)    ??? PVD (peripheral vascular disease)    ??? DM (diabetes mellitus)    ??? Hyperlipidemia    ??? Sleep apnea      on CPAP   ??? COPD (chronic obstructive pulmonary disease)      mild COPD, recurrent pneumonia, chronic bronchitis (followed by Dr. Leroy Libman)   ??? CKD (chronic kidney disease)      Cr 1.7 in 10/09    ??? CHF (congestive heart failure)    ??? Anemia    ??? Pulmonary hypertension      PASP 60 on echo 06/22/09         PSH:  Past Surgical History   Procedure Date   ??? Hx orthopaedic      knee replacement   ??? Vascular surgery procedure unlist      arterial bypass   ??? Hx pacemaker    ??? Cardiac surg procedure unlist      cardiac stent         Allergies:  No Known Allergies    Home Medications:  Prior to Admission Medications   Medication Last Dose Informant Patient Reported? Taking?   insulin glargine (LANTUS) 100 unit/mL injection Unknown at Unknown  Yes No   30 Units by SubCUTAneous route once.   potassium chloride SA (MICRO-K) 10 mEq capsule Unknown at Unknown  Yes No   Take 10 mEq by mouth daily.   amlodipine (NORVASC) 5 mg tablet Unknown at Unknown  No No  Take 1 Tab by mouth daily.   misoprostol (CYTOTEC) 200 mcg tablet Unknown at Unknown  No No   Take 1 Tab by mouth two (2) times a day.   bumetanide (BUMEX) 2 mg tablet Unknown at Unknown  Yes No   Take 2 mg by mouth two (2) times a day.   lisinopril (PRINIVIL, ZESTRIL) 20 mg tablet Unknown at Unknown  Yes No   Take  by mouth daily.   clopidogrel (PLAVIX) 75 mg tablet Unknown at Unknown  Yes No   Take  by mouth daily.   rosuvastatin (CRESTOR) 20 mg tablet Unknown at Unknown  Yes No   Take 20 mg by mouth daily.   terazosin (HYTRIN) 5 mg capsule Unknown at Unknown  Yes No   Take 10 mg by mouth nightly.   isosorbide mononitrate ER (IMDUR) 60 mg CR tablet Unknown at Unknown  No No   Take 1 Tab by mouth every morning.   aspirin 81 mg tablet Unknown at Unknown  Yes No   Take  by mouth daily.   omega-3 fatty acids-vitamin e (FISH OIL) 1,000 mg Cap Unknown at Unknown  Yes No   Take  by mouth two (2) times a day.   levothyroxine (SYNTHROID) 150 mcg tablet Unknown at Unknown  Yes No   Take  by mouth daily (before breakfast).   METOPROLOL TARTRATE PO Unknown at Unknown  Yes No   Take 25 mg by mouth two (2) times a day.    nitroglycerin (NITROSTAT) 0.4 mg SL tablet Unknown at Unknown  Yes No   by SubLINGual route every five (5) minutes as needed.   insulin aspart (NOVOLOG) 100 unit/mL injection Unknown at Unknown  Yes No   by SubCUTAneous route.   ranitidine hcl (ZANTAC) 150 mg capsule Unknown at Unknown  Yes No   Take 150 mg by mouth two (2) times a day.          Hospital Medications:  Current facility-administered medications   Medication Dose Route Frequency   ??? 0.9% sodium chloride infusion 250 mL  250 mL IntraVENous CONTINUOUS   ??? Potassium & Sodium Phosphates (NEUTRA-PHOS) packet 2 Packet  2 Packet Oral ONCE   ??? insulin glargine (LANTUS) injection 15 Units  15 Units SubCUTAneous DAILY   ??? insulin aspart (NOVOLOG) 100 unit/mL injection 6 Units  6 Units SubCUTAneous TIDAC   ??? metoprolol (LOPRESSOR) tablet 50 mg  50 mg Oral BID   ??? aspirin chewable tablet 162 mg  162 mg Oral DAILY   ??? heparin 25,000 units in D5W 250 ml infusion  12-25 Units/kg/hr IntraVENous TITRATE   ??? heparin (porcine) injection 4,000 Units  4,000 Units IntraVENous PRN   ??? heparin (porcine) injection 2,000 Units  2,000 Units IntraVENous PRN   ??? dextrose (D50W) injection Syrg 25 g  25 g IntraVENous PRN   ??? insulin aspart (NOVOLOG)   SubCUTAneous AC&HS   ??? clopidogrel (PLAVIX) tablet 75 mg  75 mg Oral DAILY   ??? levothyroxine (SYNTHROID) tablet 150 mcg  150 mcg Oral ACB   ??? nitroglycerin (NITROSTAT) tablet 0.4 mg  0.4 mg SubLINGual Q5MIN PRN   ??? fish oil-omega-3 fatty acids 340-1,000 mg capsule 2 Cap  2 Cap Oral BID   ??? rosuvastatin (CRESTOR) tablet 20 mg  20 mg Oral DAILY   ??? terazosin (HYTRIN) capsule 10 mg  10 mg Oral QHS   ??? docusate sodium (COLACE) capsule 100 mg  100 mg Oral BID   ???  bisacodyl (DULCOLAX) tablet 5 mg  5 mg Oral DAILY PRN   ??? bisacodyl (DULCOLAX) suppository 10 mg  10 mg Rectal DAILY PRN   ??? oxycodone-acetaminophen (PERCOCET) 5-325 mg per tablet 2 Tab  2 Tab Oral Q6H PRN   ??? morphine injection 2 mg  2 mg IntraVENous Q4H PRN    ??? glucose chewable tablet 12 g  12 g Oral PRN   ??? glucagon (GLUCAGEN) injection 1 mg  1 mg IntraMUSCular PRN   ??? famotidine (PEPCID) tablet 20 mg  20 mg Oral DAILY         Social History:  History   Substance Use Topics   ??? Smoking status: Former Smoker -- 20 years     Types: Cigarettes   ??? Smokeless tobacco: Not on file   ??? Alcohol Use: No         Family History:  Family History   Problem Relation Age of Onset   ??? Hypertension       No knowledge of any colon cancer in his family or ulcer disease.    Review of Systems:  Constitutional: negative fever, negative chills, negative weight loss  Eyes:   negative visual changes  ENT:   negative sore throat, tongue or lip swelling  Respiratory:  negative cough, negative dyspnea  Cards:  negative for chest pain, palpitations, lower extremity edema  GI:   See HPI  GU:  negative for frequency, dysuria  Integument:  negative for rash and pruritus  Heme:  negative for easy bruising and gum/nose bleeding  Musculoskel: negative for myalgias,  back pain and muscle weakness  Neuro: negative for headaches, dizziness, vertigo  Psych:  negative for feelings of anxiety, depression     Objective:   Patient Vitals in the past 8 hrs:   BP Temp Pulse Resp SpO2   10/02/09 0932 145/87 mmHg 98.3 ??F (36.8 ??C) 63  18  98 %   10/02/09 0420 157/89 mmHg 97.3 ??F (36.3 ??C) 89  18  99 %          In: 3595.6 [P.O.:1520; I.V.:2075.6]  Out: 2925 [Urine:2925]    EXAM:     NEURO-Alert and oriented x 3   HEENT-PERLA, EOMI, nonichteric   LUNGS-Clear to ausculation bilaterally.   CARD-Rate regular and rhythym, S1 S2.  + murmur.   ABD-Soft , nontender, nondistened. Bowel sounds normoactive.No hepatosplenomegaly and no masses.   EXT-No edema, warm.   PSYCH - Pleasant and cooperative.     Data Review     Recent Labs   Basename 10/02/09 0410 10/01/09 0047   ??? WBC 7.8 11.0   ??? HGB 7.7* 8.1*   ??? HCT 23.6* 23.4*   ??? PLT 130* 142*       Recent Labs   Basename 10/02/09 0410 10/01/09 0047   ??? NA 137 136   ??? K 3.8 3.5    ??? CL 100 100   ??? CO2 30 29   ??? BUN 55* 83*   ??? CREA 1.8* 2.3*   ??? GLU 230* 103*   ??? PHOS 1.8* 1.6*   ??? CA 8.0* 7.9*       Recent Labs   Basename 09/29/09 2300 09/29/09 1935   ??? SGOT 70* 31   ??? GPT 30 29   ??? AP 91 88   ??? TBIL 0.4 0.6   ??? TP 7.8 7.9   ??? ALB 3.6 3.5   ??? GLOB 4.2* 4.4*   ??? GGT -- --   ???  AML -- --   ??? LPSE -- 91         No results found for this basename: INR:2,PTP:2,APTT:2, in the last 72 hours       Assessment:   ?? Anemia.         Plan:   ?? Will arrange for EGD and colonoscopy given his history of colon polyps, occult CA and recent stressors and increased risk of stress ulcers with need for Coumadin.     Signed By: Lula Olszewski, NP     10/02/2009  12:14 PM

## 2009-10-02 NOTE — Progress Notes (Signed)
NUTRITION    BEST PRACTICE ALERT:   Braden Score: 21      Nutrition: Adequate      RD PLAN:   No nutrition risk identified at this time.  Pt eating 100% of meals and is well nourished wt for ht.  Patient likely to meet nutrition needs without additional interventions.  Will see again for nutrition rescreen as indicated.      PAMELA CAMPBELL, RD

## 2009-10-02 NOTE — Consults (Signed)
Patient seen and examined, agree with above. He understands that colonoscopy is being done on Plavix and if large polyps are found, they can not be removed at this point due to high risk of bleeding.

## 2009-10-02 NOTE — Progress Notes (Signed)
Notified Dr. Lebron Quam of occult positive stool; cardiology called secondary to patient on Heparin drip.

## 2009-10-02 NOTE — Progress Notes (Signed)
S: Feels fine today  Physical Exam:  BP 145/87   Pulse 63   Temp 98.3 ??F (36.8 ??C)   Resp 18   Ht 5\' 6"  (1.676 m)   Wt 166 lb (75.297 kg)   BMI 26.79 kg/m2   SpO2 98%  Patient appears generally well, mood and affect are appropriate and pleasant.  HEENT:  Normocephalic, atraumatic.   Neck Exam: Supple,  Lung Exam: Clear to auscultation, even breath sounds.   Cardiac Exam: Regular rate and rhythm with 1-2/6 systolic murmur.    Abdomen: Soft, non-tender, non-distended, normal bowel sounds.   Extremities: No pitting lower extremity edema.  Vasc - poor DP bilat      Current facility-administered medications   Medication Dose Route Frequency   ??? 0.9% sodium chloride infusion 250 mL  250 mL IntraVENous CONTINUOUS   ??? Potassium & Sodium Phosphates (NEUTRA-PHOS) packet 2 Packet  2 Packet Oral ONCE   ??? insulin glargine (LANTUS) injection 15 Units  15 Units SubCUTAneous DAILY   ??? insulin aspart (NOVOLOG) 100 unit/mL injection 6 Units  6 Units SubCUTAneous TIDAC   ??? metoprolol (LOPRESSOR) tablet 50 mg  50 mg Oral BID   ??? aspirin chewable tablet 162 mg  162 mg Oral DAILY   ??? heparin 25,000 units in D5W 250 ml infusion  12-25 Units/kg/hr IntraVENous TITRATE   ??? heparin (porcine) injection 4,000 Units  4,000 Units IntraVENous PRN   ??? heparin (porcine) injection 2,000 Units  2,000 Units IntraVENous PRN   ??? dextrose (D50W) injection Syrg 25 g  25 g IntraVENous PRN   ??? insulin aspart (NOVOLOG)   SubCUTAneous AC&HS   ??? DISCONTD: insulin glargine (LANTUS) injection 30 Units  30 Units SubCUTAneous DAILY   ??? clopidogrel (PLAVIX) tablet 75 mg  75 mg Oral DAILY   ??? levothyroxine (SYNTHROID) tablet 150 mcg  150 mcg Oral ACB   ??? nitroglycerin (NITROSTAT) tablet 0.4 mg  0.4 mg SubLINGual Q5MIN PRN   ??? fish oil-omega-3 fatty acids 340-1,000 mg capsule 2 Cap  2 Cap Oral BID   ??? rosuvastatin (CRESTOR) tablet 20 mg  20 mg Oral DAILY   ??? terazosin (HYTRIN) capsule 10 mg  10 mg Oral QHS   ??? docusate sodium (COLACE) capsule 100 mg  100 mg Oral BID    ??? bisacodyl (DULCOLAX) tablet 5 mg  5 mg Oral DAILY PRN   ??? bisacodyl (DULCOLAX) suppository 10 mg  10 mg Rectal DAILY PRN   ??? oxycodone-acetaminophen (PERCOCET) 5-325 mg per tablet 2 Tab  2 Tab Oral Q6H PRN   ??? morphine injection 2 mg  2 mg IntraVENous Q4H PRN   ??? glucose chewable tablet 12 g  12 g Oral PRN   ??? glucagon (GLUCAGEN) injection 1 mg  1 mg IntraMUSCular PRN   ??? famotidine (PEPCID) tablet 20 mg  20 mg Oral DAILY         Recent Results (from the past 24 hour(s))   GLUCOSE, POC    Collection Time    10/01/09 12:08 PM   Component Value Range   ??? POC GLUCOSE 309 (*) 75 - 110 (mg/dL)   PTT    Collection Time    10/01/09  1:10 PM   Component Value Range   ??? aPTT 39.5 (*) 24.0 - 33.0 (sec)   GLUCOSE, POC    Collection Time    10/01/09  4:57 PM   Component Value Range   ??? POC GLUCOSE 87  75 - 110 (mg/dL)  PTT    Collection Time    10/01/09  8:50 PM   Component Value Range   ??? aPTT 94.9 (*) 24.0 - 33.0 (sec)   GLUCOSE, POC    Collection Time    10/01/09  9:43 PM   Component Value Range   ??? POC GLUCOSE 46 (*) 75 - 110 (mg/dL)   GLUCOSE, POC    Collection Time    10/01/09  9:45 PM   Component Value Range   ??? POC GLUCOSE 41 (*) 75 - 110 (mg/dL)   GLUCOSE, POC    Collection Time    10/01/09 10:00 PM   Component Value Range   ??? POC GLUCOSE 60 (*) 75 - 110 (mg/dL)   GLUCOSE, POC    Collection Time    10/01/09 10:32 PM   Component Value Range   ??? POC GLUCOSE 112 (*) 75 - 110 (mg/dL)   CBC W/O DIFF    Collection Time    10/02/09  4:10 AM   Component Value Range   ??? WBC 7.8  4.1 - 11.1 (K/uL)   ??? RBC 2.72 (*) 4.10 - 5.70 (M/uL)   ??? HGB 7.7 (*) 12.1 - 17.0 (g/dL)   ??? HCT 23.6 (*) 36.6 - 50.3 (%)   ??? MCV 86.8  80.0 - 99.0 (FL)   ??? MCH 28.3  26.0 - 34.0 (PG)   ??? MCHC 32.6  30.0 - 36.5 (g/dL)   ??? RDW 16.0 (*) 11.5 - 14.5 (%)   ??? PLATELET 130 (*) 150 - 400 (K/uL)   METABOLIC PANEL, BASIC    Collection Time    10/02/09  4:10 AM   Component Value Range   ??? Sodium 137  136 - 145 (MMOL/L)   ??? Potassium 3.8  3.5 - 5.1 (MMOL/L)    ??? Chloride 100  97 - 108 (MMOL/L)   ??? CO2 30  21 - 32 (MMOL/L)   ??? Anion gap 7  5 - 15 (mmol/L)   ??? Glucose 230 (*) 65 - 100 (MG/DL)   ??? BUN 55 (*) 6 - 20 (MG/DL)   ??? Creatinine 1.8 (*) 0.6 - 1.3 (MG/DL)   ??? BUN/Creatinine ratio 31 (*) 12 - 20 ( )   ??? GFR est AA 48 (*) >60 (ml/min/1.36m2)   ??? GFR est non-AA 40 (*) >60 (ml/min/1.60m2)   ??? Calcium 8.0 (*) 8.5 - 10.1 (MG/DL)   PHOSPHORUS    Collection Time    10/02/09  4:10 AM   Component Value Range   ??? Phosphorus 1.8 (*) 2.5 - 4.9 (MG/DL)   MAGNESIUM    Collection Time    10/02/09  4:10 AM   Component Value Range   ??? Magnesium 2.1  1.6 - 2.4 (MG/DL)   PTT    Collection Time    10/02/09  4:10 AM   Component Value Range   ??? aPTT 96.6 (*) 24.0 - 33.0 (sec)   TYPE & CROSSMATCH    Collection Time    10/02/09  6:55 AM   Component Value Range   ??? Crossmatch Expiration 10/05/2009     ??? ABO/Rh(D) O POS     ??? Antibody screen NEG     ??? Unit number [id number]     ??? Blood component type RC LR AS5     ??? Unit division 00     ??? Status of unit ALLOCATED     ??? Crossmatch result COMPATIBLE     GLUCOSE, POC    Collection  Time    10/02/09  8:02 AM   Component Value Range   ??? POC GLUCOSE 218 (*) 75 - 110 (mg/dL)       Assessment and Plan:  s/p NSTEMI   - he's had recurrent NSTEMI's the past few months.  He has a chronic LCX lesion which has not been amenable to PCI (noted the past 3 caths), which I suspect is the culprit territory.  I've asked interventional cardiology to review the cath to see if the LCX territory would be something we could potentially approach - he would be high risk of renal and vascular complications with a cath.  Travis [name] is reluctant himself to have another cath.  For now continue medical management (IV Heparin).       Tharon Aquas, MD, [facility]

## 2009-10-02 NOTE — Progress Notes (Signed)
Bedside and Verbal shift change report given to Jeannette How (oncoming nurse) by Hassan Rowan, RN   (offgoing nurse).  Report given with SBAR, Kardex, Intake/Output, Accordion and Recent Results.

## 2009-10-02 NOTE — Progress Notes (Signed)
Chart reviewed, discussed Pt with nursing. Pt up in chair - states he is walking to the bathroom and in the hallway independently and is at baseline mobility. Nurse states she has seen him ambulate in the hallway. Pt reports no weakness, decreased balance or dizziness. Pt instructed in B LE exercises and encouraged to keep active and to exercise regularly. No further PT needed at this time.

## 2009-10-02 NOTE — Progress Notes (Signed)
APTT 55.4; increased Heparin gtt by 1unit/kg/hr; redraw aPTT in 6 hours per protocol.

## 2009-10-02 NOTE — Progress Notes (Signed)
OT order received.  Chart reviewed and spoke with patient and nursing.  Patient stated he is moving around independently with no feelings of leg weakness or LOB.  Patient is also toileting himself independently.  Patient states he is fine.  Patient does not feel he need OT at this time.  Patient is familiar to OT services from previous admission.  Will not follow patient, patient in agreement.

## 2009-10-02 NOTE — Progress Notes (Signed)
Notified Dr.Moses about the critical results about the Hgb 7.7.Hassan Rowan, RN

## 2009-10-02 NOTE — Progress Notes (Signed)
Dr. Elmon Kirschner made aware of occult positive stools and hemoglobin level; Dr. Elmon Kirschner stated to continue Heparin drip; will recheck CBC in am and follow hemoglobin levels.

## 2009-10-02 NOTE — Progress Notes (Signed)
Sent stool sample for heme occult.

## 2009-10-03 LAB — CBC W/O DIFF
HCT: 23.9 % — ABNORMAL LOW (ref 36.6–50.3)
HGB: 8.2 g/dL — ABNORMAL LOW (ref 12.1–17.0)
MCH: 29.5 PG (ref 26.0–34.0)
MCHC: 34.3 g/dL (ref 30.0–36.5)
MCV: 86 FL (ref 80.0–99.0)
PLATELET: 137 10*3/uL — ABNORMAL LOW (ref 150–400)
RBC: 2.78 M/uL — ABNORMAL LOW (ref 4.10–5.70)
RDW: 16.3 % — ABNORMAL HIGH (ref 11.5–14.5)
WBC: 7.2 10*3/uL (ref 4.1–11.1)

## 2009-10-03 LAB — METABOLIC PANEL, COMPREHENSIVE
A-G Ratio: 0.7 — ABNORMAL LOW (ref 1.1–2.2)
ALT (SGPT): 29 U/L (ref 12–78)
AST (SGOT): 46 U/L — ABNORMAL HIGH (ref 15–37)
Albumin: 2.9 g/dL — ABNORMAL LOW (ref 3.5–5.0)
Alk. phosphatase: 85 U/L (ref 50–136)
Anion gap: 5 mmol/L (ref 5–15)
BUN/Creatinine ratio: 21 — ABNORMAL HIGH (ref 12–20)
BUN: 31 MG/DL — ABNORMAL HIGH (ref 6–20)
Bilirubin, total: 0.4 MG/DL (ref 0.2–1.0)
CO2: 29 MMOL/L (ref 21–32)
Calcium: 8.1 MG/DL — ABNORMAL LOW (ref 8.5–10.1)
Chloride: 100 MMOL/L (ref 97–108)
Creatinine: 1.5 MG/DL — ABNORMAL HIGH (ref 0.6–1.3)
GFR est AA: 60 mL/min/{1.73_m2} — ABNORMAL LOW (ref >60)
GFR est non-AA: 49 mL/min/{1.73_m2} — ABNORMAL LOW (ref >60)
Globulin: 4.2 g/dL — ABNORMAL HIGH (ref 2.0–4.0)
Glucose: 202 MG/DL — ABNORMAL HIGH (ref 65–100)
Potassium: 3.8 MMOL/L (ref 3.5–5.1)
Protein, total: 7.1 g/dL (ref 6.4–8.2)
Sodium: 134 MMOL/L — ABNORMAL LOW (ref 136–145)

## 2009-10-03 LAB — TYPE + CROSSMATCH
ABO/Rh(D): O POS
Antibody screen: NEGATIVE
Status of unit: TRANSFUSED
Unit division: 0

## 2009-10-03 LAB — GLUCOSE, POC
Glucose (POC): 152 mg/dL — ABNORMAL HIGH (ref 75–110)
Glucose (POC): 173 mg/dL — ABNORMAL HIGH (ref 75–110)
Glucose (POC): 73 mg/dL — ABNORMAL LOW (ref 75–110)
Glucose (POC): 77 mg/dL (ref 75–110)
Glucose (POC): 80 mg/dL (ref 75–110)
Glucose (POC): 144 mg/dL — ABNORMAL HIGH (ref 75–110)
Glucose (POC): 253 mg/dL — ABNORMAL HIGH (ref 75–110)
Glucose (POC): 375 mg/dL — ABNORMAL HIGH (ref 75–110)

## 2009-10-03 LAB — PTT
aPTT: 55.4 s — ABNORMAL HIGH (ref 24.0–33.0)
aPTT: 57.6 s — ABNORMAL HIGH (ref 24.0–33.0)
aPTT: 63.7 s — ABNORMAL HIGH (ref 24.0–33.0)

## 2009-10-03 LAB — TSH 3RD GENERATION: TSH: 1.82 u[IU]/mL (ref 0.36–3.74)

## 2009-10-03 LAB — MAGNESIUM: Magnesium: 1.9 MG/DL (ref 1.6–2.4)

## 2009-10-03 LAB — PHOSPHORUS: Phosphorus: 1.8 MG/DL — ABNORMAL LOW (ref 2.5–4.9)

## 2009-10-03 LAB — OCCULT BLOOD, STOOL: Occult blood, stool: POSITIVE — AB

## 2009-10-03 MED ORDER — SODIUM CHLORIDE 0.9 % IV
INTRAVENOUS | Status: DC
Start: 2009-10-03 — End: 2009-10-04
  Administered 2009-10-03 – 2009-10-04 (×2): via INTRAVENOUS

## 2009-10-03 MED ORDER — PANTOPRAZOLE 40 MG TAB, DELAYED RELEASE
40 mg | Freq: Every day | ORAL | Status: DC
Start: 2009-10-03 — End: 2009-10-15
  Administered 2009-10-03 – 2009-10-14 (×12): via ORAL

## 2009-10-03 MED ORDER — MIDAZOLAM 1 MG/ML IJ SOLN
1 mg/mL | INTRAMUSCULAR | Status: DC | PRN
Start: 2009-10-03 — End: 2009-10-03
  Administered 2009-10-03 (×4): via INTRAVENOUS

## 2009-10-03 MED ORDER — FENTANYL CITRATE (PF) 50 MCG/ML IJ SOLN
50 mcg/mL | INTRAMUSCULAR | Status: DC | PRN
Start: 2009-10-03 — End: 2009-10-03
  Administered 2009-10-03 (×2): via INTRAVENOUS

## 2009-10-03 MED ORDER — INSULIN GLARGINE 100 UNIT/ML INJECTION
100 unit/mL | Freq: Every day | SUBCUTANEOUS | Status: DC
Start: 2009-10-03 — End: 2009-10-04
  Administered 2009-10-03 – 2009-10-04 (×2): via SUBCUTANEOUS

## 2009-10-03 MED ORDER — POTASSIUM & SODIUM PHOSPHATES 280 MG-160 MG-250 MG ORAL POWDER PACKET
280-160-250 mg | Freq: Once | ORAL | Status: AC
Start: 2009-10-03 — End: 2009-10-03
  Administered 2009-10-03: 14:00:00 via ORAL

## 2009-10-03 MED ORDER — INSULIN ASPART 100 UNIT/ML INJECTION
100 unit/mL | Freq: Three times a day (TID) | SUBCUTANEOUS | Status: DC
Start: 2009-10-03 — End: 2009-10-08
  Administered 2009-10-03 – 2009-10-07 (×11): via SUBCUTANEOUS

## 2009-10-03 MED FILL — PANTOPRAZOLE 40 MG TAB, DELAYED RELEASE: 40 mg | ORAL | Qty: 1

## 2009-10-03 MED FILL — INSULIN ASPART 100 UNIT/ML INJECTION: 100 unit/mL | SUBCUTANEOUS | Qty: 1

## 2009-10-03 MED FILL — ASPIRIN 81 MG CHEWABLE TAB: 81 mg | ORAL | Qty: 2

## 2009-10-03 MED FILL — LEVOTHYROXINE 150 MCG TAB: 150 mcg | ORAL | Qty: 1

## 2009-10-03 MED FILL — MIDAZOLAM 1 MG/ML IJ SOLN: 1 mg/mL | INTRAMUSCULAR | Qty: 10

## 2009-10-03 MED FILL — FAMOTIDINE 20 MG TAB: 20 mg | ORAL | Qty: 1

## 2009-10-03 MED FILL — FISH OIL 340 MG-1,000 MG CAPSULE: 340-1000 mg | ORAL | Qty: 2

## 2009-10-03 MED FILL — INSULIN GLARGINE 100 UNIT/ML INJECTION: 100 unit/mL | SUBCUTANEOUS | Qty: 0.15

## 2009-10-03 MED FILL — METOPROLOL TARTRATE 50 MG TAB: 50 mg | ORAL | Qty: 1

## 2009-10-03 MED FILL — CRESTOR 10 MG TABLET: 10 mg | ORAL | Qty: 2

## 2009-10-03 MED FILL — TERAZOSIN 5 MG CAP: 5 mg | ORAL | Qty: 2

## 2009-10-03 MED FILL — FENTANYL CITRATE (PF) 50 MCG/ML IJ SOLN: 50 mcg/mL | INTRAMUSCULAR | Qty: 2

## 2009-10-03 MED FILL — SODIUM CHLORIDE 0.9 % IV: INTRAVENOUS | Qty: 1000

## 2009-10-03 MED FILL — PLAVIX 75 MG TABLET: 75 mg | ORAL | Qty: 1

## 2009-10-03 MED FILL — PHOS-NAK 280 MG-160 MG-250 MG ORAL POWDER PACKET: 280-160-250 mg | ORAL | Qty: 2

## 2009-10-03 NOTE — Progress Notes (Signed)
Pt refused the 12 units, but would take 8 units for night time coverage. Snack provided.

## 2009-10-03 NOTE — Progress Notes (Signed)
Dr. Lebron Quam notified of patient blood pressure 171/77 at this time. Also patient Blood Sugar remains high at 469 and 486 on recheck. Explained patient rises and drops. Discussed with MD and will give only 12 units of sliding scale at this time. Dr. Lebron Quam placing order for BP control for tonight and medications will be reviewed in am.

## 2009-10-03 NOTE — Progress Notes (Signed)
DTC:   Provided pt with information he requested re: carbohydrate counting.  Will review with him prior to discharge.    Patti Shorb RN, CDE

## 2009-10-03 NOTE — Progress Notes (Signed)
Gastrointestinal Progress Note    10/03/2009    Admit Date: 09/29/2009    Subjective:     Patient states that he was able to complete the bowel prep and had multiple BMs overnight.  He describes the last few as being watery and clear without stool.      Objective:     Blood pressure 131/72, pulse 89, temperature 98.2 ??F (36.8 ??C), resp. rate 18, height 5\' 6"  (1.676 m), weight 166 lb (75.297 kg), SpO2 99.00%.    Physical Exam:    General: alert, cooperative, no distress, appears stated age  Neuro: alert, oriented x3, affect appropriate  Heart: regular rate and rhythm, S1, S2 normal  Lungs: clear to auscultation bilaterally  Abdomen: normal bowel sounds, soft, non-tender, no distension, without organomegaly, normal percussion    Data Review:  Recent Results (from the past 24 hour(s))   GLUCOSE, POC    Collection Time    10/02/09 12:30 PM   Component Value Range   ??? POC GLUCOSE 541 (*) 75 - 110 (mg/dL)   IRON PROFILE    Collection Time    10/02/09  1:25 PM   Component Value Range   ??? Iron 66  35 - 150 (ug/dL)   ??? TIBC 188 (*) 250 - 450 (ug/dL)   ??? Iron % saturation 35  20 - 50 (%)   FERRITIN    Collection Time    10/02/09  1:25 PM   Component Value Range   ??? Ferritin 498 (*) 26 - 388 (NG/ML)   PTT    Collection Time    10/02/09  1:25 PM   Component Value Range   ??? aPTT 64.8 (*) 24.0 - 33.0 (sec)   GLUCOSE, POC    Collection Time    10/02/09  4:59 PM   Component Value Range   ??? POC GLUCOSE 166 (*) 75 - 110 (mg/dL)   OCCULT BLOOD, STOOL    Collection Time    10/02/09  8:30 PM   Component Value Range   ??? Occult blood, stool POSITIVE (*) NEGATIVE    PTT    Collection Time    10/02/09  8:45 PM   Component Value Range   ??? aPTT 55.4 (*) 24.0 - 33.0 (sec)   GLUCOSE, POC    Collection Time    10/02/09  9:05 PM   Component Value Range   ??? POC GLUCOSE 152 (*) 75 - 110 (mg/dL)   GLUCOSE, POC    Collection Time    10/03/09 12:28 AM   Component Value Range   ??? POC GLUCOSE 77  75 - 110 (mg/dL)   GLUCOSE, POC    Collection Time    10/03/09 12:32 AM    Component Value Range   ??? POC GLUCOSE 73 (*) 75 - 110 (mg/dL)   GLUCOSE, POC    Collection Time    10/03/09 12:51 AM   Component Value Range   ??? POC GLUCOSE 80  75 - 110 (mg/dL)   GLUCOSE, POC    Collection Time    10/03/09  1:29 AM   Component Value Range   ??? POC GLUCOSE 173 (*) 75 - 110 (mg/dL)   CBC W/O DIFF    Collection Time    10/03/09  3:00 AM   Component Value Range   ??? WBC 7.2  4.1 - 11.1 (K/uL)   ??? RBC 2.78 (*) 4.10 - 5.70 (M/uL)   ??? HGB 8.2 (*) 12.1 - 17.0 (g/dL)   ???  HCT 23.9 (*) 36.6 - 50.3 (%)   ??? MCV 86.0  80.0 - 99.0 (FL)   ??? MCH 29.5  26.0 - 34.0 (PG)   ??? MCHC 34.3  30.0 - 36.5 (g/dL)   ??? RDW 16.3 (*) 11.5 - 14.5 (%)   ??? PLATELET 137 (*) 150 - 400 (K/uL)   MAGNESIUM    Collection Time    10/03/09  3:00 AM   Component Value Range   ??? Magnesium 1.9  1.6 - 2.4 (MG/DL)   METABOLIC PANEL, COMPREHENSIVE    Collection Time    10/03/09  3:00 AM   Component Value Range   ??? Sodium 134 (*) 136 - 145 (MMOL/L)   ??? Potassium 3.8  3.5 - 5.1 (MMOL/L)   ??? Chloride 100  97 - 108 (MMOL/L)   ??? CO2 29  21 - 32 (MMOL/L)   ??? Anion gap 5  5 - 15 (mmol/L)   ??? Glucose 202 (*) 65 - 100 (MG/DL)   ??? BUN 31 (*) 6 - 20 (MG/DL)   ??? Creatinine 1.5 (*) 0.6 - 1.3 (MG/DL)   ??? BUN/Creatinine ratio 21 (*) 12 - 20 ( )   ??? GFR est AA 60 (*) >60 (ml/min/1.53m2)   ??? GFR est non-AA 49 (*) >60 (ml/min/1.71m2)   ??? Calcium 8.1 (*) 8.5 - 10.1 (MG/DL)   ??? Bilirubin, total 0.4  0.2 - 1.0 (MG/DL)   ??? ALT 29  12 - 78 (U/L)   ??? AST 46 (*) 15 - 37 (U/L)   ??? Alk. phosphatase 85  50 - 136 (U/L)   ??? Protein, total 7.1  6.4 - 8.2 (g/dL)   ??? Albumin 2.9 (*) 3.5 - 5.0 (g/dL)   ??? Globulin 4.2 (*) 2.0 - 4.0 (g/dL)   ??? A-G Ratio 0.7 (*) 1.1 - 2.2 ( )   PHOSPHORUS    Collection Time    10/03/09  3:00 AM   Component Value Range   ??? Phosphorus 1.8 (*) 2.5 - 4.9 (MG/DL)   PTT    Collection Time    10/03/09  3:00 AM   Component Value Range   ??? aPTT 57.6 (*) 24.0 - 33.0 (sec)   GLUCOSE, POC    Collection Time    10/03/09  7:51 AM   Component Value Range    ??? POC GLUCOSE 144 (*) 75 - 110 (mg/dL)       Assessment :      Anemia - s/p 1 unit PRBCs yesterday. Hgb 8.2, up from 7.7     Plan :      EGD and colonoscopy this afternoon at 1 pm  Explained to patient that given his cardiac history and need for anticoagulants, if polyps are found in the colon, they will not be able to be removed at this time. Explained that he needs to be off of anticoagulants for several days before polypectomy, and if found, he will need repeat colonoscopy with polypectomy at a future time.    Will hold Plavix today until after the procedures - discussed with nurse. He is currently getting Heparin IV and will take ASA.  Will hold insulin until after procedures, as patient has been NPO since MN.      The above will be discussed with Dr. Monika Salk.

## 2009-10-03 NOTE — Progress Notes (Signed)
TRANSFER - OUT REPORT:    Verbal report given to [facility], RN (name) on Travis [name]  being transferred to [facility] (unit) for routine post - op       Report consisted of patient???s Situation, Background, Assessment and   Recommendations(SBAR).     Information from the following report(s) SBAR, Procedure Summary and MAR was reviewed with the receiving nurse.    Opportunity for questions and clarification was provided.

## 2009-10-03 NOTE — Progress Notes (Signed)
Administered patient's Metoprolol early secondary to elevated blood pressure.

## 2009-10-03 NOTE — Progress Notes (Signed)
APTT is 57.6 ; considered therapeutic; continue Heparin drip at current rate. Verified with Armen Pickup RN. Redraw aPTT in 6 hours per protocol.

## 2009-10-03 NOTE — Progress Notes (Signed)
S: Uneventful night.  Plan for colonoscopy today.  O:Physical Exam:   BP 131/72   Pulse 89   Temp 98.2 ??F (36.8 ??C)   Resp 18   Ht 5\' 6"  (1.676 m)   Wt 166 lb (75.297 kg)   BMI 26.79 kg/m2   SpO2 99%   Patient appears generally well, mood and affect are appropriate and pleasant.   HEENT: Normocephalic, atraumatic.   Neck Exam: Supple,   Lung Exam: Clear to auscultation, even breath sounds.   Cardiac Exam: Regular rate and rhythm with 1-2/6 systolic murmur.   Abdomen: Soft, non-tender, non-distended, normal bowel sounds.   Extremities: No pitting lower extremity edema.   Vasc - poor DP bilat    Recent Results (from the past 24 hour(s))   GLUCOSE, POC    Collection Time    10/02/09 12:30 PM   Component Value Range   ??? POC GLUCOSE 541 (*) 75 - 110 (mg/dL)   IRON PROFILE    Collection Time    10/02/09  1:25 PM   Component Value Range   ??? Iron 66  35 - 150 (ug/dL)   ??? TIBC 188 (*) 250 - 450 (ug/dL)   ??? Iron % saturation 35  20 - 50 (%)   FERRITIN    Collection Time    10/02/09  1:25 PM   Component Value Range   ??? Ferritin 498 (*) 26 - 388 (NG/ML)   PTT    Collection Time    10/02/09  1:25 PM   Component Value Range   ??? aPTT 64.8 (*) 24.0 - 33.0 (sec)   GLUCOSE, POC    Collection Time    10/02/09  4:59 PM   Component Value Range   ??? POC GLUCOSE 166 (*) 75 - 110 (mg/dL)   OCCULT BLOOD, STOOL    Collection Time    10/02/09  8:30 PM   Component Value Range   ??? Occult blood, stool POSITIVE (*) NEGATIVE    PTT    Collection Time    10/02/09  8:45 PM   Component Value Range   ??? aPTT 55.4 (*) 24.0 - 33.0 (sec)   GLUCOSE, POC    Collection Time    10/02/09  9:05 PM   Component Value Range   ??? POC GLUCOSE 152 (*) 75 - 110 (mg/dL)   GLUCOSE, POC    Collection Time    10/03/09 12:28 AM   Component Value Range   ??? POC GLUCOSE 77  75 - 110 (mg/dL)   GLUCOSE, POC    Collection Time    10/03/09 12:32 AM   Component Value Range   ??? POC GLUCOSE 73 (*) 75 - 110 (mg/dL)   GLUCOSE, POC    Collection Time    10/03/09 12:51 AM   Component Value Range    ??? POC GLUCOSE 80  75 - 110 (mg/dL)   GLUCOSE, POC    Collection Time    10/03/09  1:29 AM   Component Value Range   ??? POC GLUCOSE 173 (*) 75 - 110 (mg/dL)   CBC W/O DIFF    Collection Time    10/03/09  3:00 AM   Component Value Range   ??? WBC 7.2  4.1 - 11.1 (K/uL)   ??? RBC 2.78 (*) 4.10 - 5.70 (M/uL)   ??? HGB 8.2 (*) 12.1 - 17.0 (g/dL)   ??? HCT 23.9 (*) 36.6 - 50.3 (%)   ??? MCV 86.0  80.0 - 99.0 (FL)   ???  MCH 29.5  26.0 - 34.0 (PG)   ??? MCHC 34.3  30.0 - 36.5 (g/dL)   ??? RDW 16.3 (*) 11.5 - 14.5 (%)   ??? PLATELET 137 (*) 150 - 400 (K/uL)   MAGNESIUM    Collection Time    10/03/09  3:00 AM   Component Value Range   ??? Magnesium 1.9  1.6 - 2.4 (MG/DL)   METABOLIC PANEL, COMPREHENSIVE    Collection Time    10/03/09  3:00 AM   Component Value Range   ??? Sodium 134 (*) 136 - 145 (MMOL/L)   ??? Potassium 3.8  3.5 - 5.1 (MMOL/L)   ??? Chloride 100  97 - 108 (MMOL/L)   ??? CO2 29  21 - 32 (MMOL/L)   ??? Anion gap 5  5 - 15 (mmol/L)   ??? Glucose 202 (*) 65 - 100 (MG/DL)   ??? BUN 31 (*) 6 - 20 (MG/DL)   ??? Creatinine 1.5 (*) 0.6 - 1.3 (MG/DL)   ??? BUN/Creatinine ratio 21 (*) 12 - 20 ( )   ??? GFR est AA 60 (*) >60 (ml/min/1.3m2)   ??? GFR est non-AA 49 (*) >60 (ml/min/1.36m2)   ??? Calcium 8.1 (*) 8.5 - 10.1 (MG/DL)   ??? Bilirubin, total 0.4  0.2 - 1.0 (MG/DL)   ??? ALT 29  12 - 78 (U/L)   ??? AST 46 (*) 15 - 37 (U/L)   ??? Alk. phosphatase 85  50 - 136 (U/L)   ??? Protein, total 7.1  6.4 - 8.2 (g/dL)   ??? Albumin 2.9 (*) 3.5 - 5.0 (g/dL)   ??? Globulin 4.2 (*) 2.0 - 4.0 (g/dL)   ??? A-G Ratio 0.7 (*) 1.1 - 2.2 ( )   PHOSPHORUS    Collection Time    10/03/09  3:00 AM   Component Value Range   ??? Phosphorus 1.8 (*) 2.5 - 4.9 (MG/DL)   PTT    Collection Time    10/03/09  3:00 AM   Component Value Range   ??? aPTT 57.6 (*) 24.0 - 33.0 (sec)   GLUCOSE, POC    Collection Time    10/03/09  7:51 AM   Component Value Range   ??? POC GLUCOSE 144 (*) 75 - 110 (mg/dL)       Assessment and Plan:   1) s/p NSTEMI    - he's had recurrent NSTEMI's the past few months. He has a chronic LCX lesion which has not been amenable to PCI (noted the past 3 caths), which I suspect is the culprit territory.  After reviewing the films again with interventional cardiology (Dr. Elmon Kirschner), Mr. Franzoni territory is not amenable to PCI and it is felt that attempting to revascularize the LCX territory would have more risk than benefit.  Will continue medical management.  I may consider an adenosine myoview later (outpatient)  to assess myocardium at risk and rule out ischemia in other territories.  Continue medical management for CAD.    2) Chronic edema - would like to resume Bumex (at 1 mg PO BID) tomorrow if renal function remains stable and OK with nephrology    3) HTN - BP higher today; if remains elevated, will restart Norvasc    4) PVD - will need coumadin for about 3 months    Tharon Aquas, MD, [facility]

## 2009-10-03 NOTE — Procedures (Signed)
[place] - [facility]  Delana Meyer, M.D.  [phone]      10/03/2009          Colonoscopy Operative Report  Travis [name]  DOB:  [birth year]  [facility] Medical Record Number:  [id number]      Indications:    Iron deficiency anemia - 280.9     Operator:  Arman Filter, MD    Referring Provider: Jeral Fruit, MD    Sedation:  Versed 2 mg IV and Fentanyl 25 mcg IV    Pre-Procedural Exam:      Airway: clear,  No airway problems anticipated  Heart: RRR, without gallops or rubs  Lungs: clear bilaterally without wheezes, crackles, or rhonchi  Abdomen: soft, nontender, nondistended, bowel sounds present  Mental Status: awake, alert and oriented to person, place and time     Procedure Details:  After informed consent was obtained with all risks and benefits of procedure explained and preoperative exam completed, the patient was taken to the endoscopy suite and placed in the left lateral decubitus position.  Upon sequential sedation as per above, a digital rectal exam was performed  And was normal.  The Olympus videocolonoscope  was inserted in the rectum and carefully advanced to the cecum, which was identified by the ileocecal valve and appendiceal orifice.  The quality of preparation was excellent.  The colonoscope was slowly withdrawn with careful evaluation between folds. Retroflexion in the rectum was performed and was normal..     Findings:   Terminal Ileum: not intubated  Cecum: normal  Ascending Colon: normal  Transverse Colon: 1  Sessile polyp(s), the largest 2 mm in size;  Descending Colon: normal  Sigmoid: normal  Rectum: Grade 1 internal and external hemorrhoid(s);    Interventions:  1  complete polypectomy were performed using  cold  biopsy forceps and the polyps were  retrieved    Specimen Removed:  specimen #1, 2 mm in size, located in the transverse colon removed by cold biopsy and sent for pathology    Complications: None.     EBL:  None.    Recommendations:  - Resume diet   - Monitor hemoglobin  - Repeat colonoscopy in 3 years if polyp is adenomatous.      Arman Filter, MD  10/03/2009  1:34 PM

## 2009-10-03 NOTE — Progress Notes (Signed)
[facility]  [address] [place], [place] [address]  [phone]      Medical Progress Note      NAME: Travis [name]   DOB:  1940-12-17  MRM:  [id number]    Date/Time: 10/03/2009  8:18 AM           Subjective:     Chief Complaint:  Black stool    Reports BM overnight that was black in color.  No abd pain.  No n/v.  Denies cp or sob.    ROS:  (bold if positive, if negative)      Tolerating Diet          Objective:       Vitals:          Last 24hrs VS reviewed since prior progress note. Most recent are:    Visit Vitals   Item Reading   ??? BP 131/72   ??? Pulse 89   ??? Temp 98.2 ??F (36.8 ??C)   ??? Resp 18   ??? Ht 5\' 6"    ??? Wt 166 lb   ??? BMI 26.79 kg/m2   ??? SpO2 99%         SpO2 Readings from Last 6 Encounters:   10/03/09 99%   10/03/09 99%   03/31/09 94%              Intake/Output Summary (Last 24 hours) at 10/03/09 0818  Last data filed at 10/03/09 0800   Gross per 24 hour   Intake 2983.38 ml   Output   1431 ml   Net 1552.38 ml            Exam:     Physical Exam:    Gen:  Well-developed, well-nourished, in no acute distress  HEENT:  Pink conjunctivae, PERRL, hearing intact to voice, moist mucous membranes  Neck:  Supple, without masses, thyroid non-tender  Resp:  No accessory muscle use, clear breath sounds without wheezes rales or rhonchi  Card:  Murmur present, normal S1, S2 without thrills or peripheral edema  Abd:  Soft, non-tender, non-distended, normoactive bowel sounds are present  Musc:  No cyanosis or clubbing  Skin:  No rashes or ulcers, skin turgor is good  Neuro:  Cranial nerves 3-12 are grossly intact, grip strength is 5/5 bilaterally and dorsi / plantarflexion is 5/5 bilaterally, follows commands appropriately  Psych:  Good insight, oriented to person, place and time, alert       Medications Reviewed: (see below)    Lab Data Reviewed: (see below)    ______________________________________________________________________    Medications:      Current facility-administered medications   Medication Dose Route Frequency   ??? 0.9% sodium chloride infusion  75 mL/hr IntraVENous CONTINUOUS   ??? 0.9% sodium chloride infusion 250 mL  250 mL IntraVENous CONTINUOUS   ??? Potassium & Sodium Phosphates (NEUTRA-PHOS) packet 2 Packet  2 Packet Oral ONCE   ??? insulin glargine (LANTUS) injection 15 Units  15 Units SubCUTAneous DAILY   ??? bisacodyl (DULCOLAX) tablet 10 mg  10 mg Oral NOW   ??? PEG 3350-Electrolytes (COLYTE;GOLYTELY) oral solution 2 L  2 L Oral ONCE   ??? epoetin alfa (EPOGEN;PROCRIT) injection 10,000 Units  10,000 Units SubCUTAneous Q7D   ??? insulin aspart (NOVOLOG) 100 unit/mL injection 6 Units  6 Units SubCUTAneous TIDAC   ??? metoprolol (LOPRESSOR) tablet 50 mg  50 mg Oral BID   ??? aspirin chewable tablet 162 mg  162  mg Oral DAILY   ??? heparin 25,000 units in D5W 250 ml infusion  12-25 Units/kg/hr IntraVENous TITRATE   ??? heparin (porcine) injection 4,000 Units  4,000 Units IntraVENous PRN   ??? heparin (porcine) injection 2,000 Units  2,000 Units IntraVENous PRN   ??? dextrose (D50W) injection Syrg 25 g  25 g IntraVENous PRN   ??? insulin aspart (NOVOLOG)   SubCUTAneous AC&HS   ??? clopidogrel (PLAVIX) tablet 75 mg  75 mg Oral DAILY   ??? levothyroxine (SYNTHROID) tablet 150 mcg  150 mcg Oral ACB   ??? nitroglycerin (NITROSTAT) tablet 0.4 mg  0.4 mg SubLINGual Q5MIN PRN   ??? fish oil-omega-3 fatty acids 340-1,000 mg capsule 2 Cap  2 Cap Oral BID   ??? rosuvastatin (CRESTOR) tablet 20 mg  20 mg Oral DAILY   ??? terazosin (HYTRIN) capsule 10 mg  10 mg Oral QHS   ??? docusate sodium (COLACE) capsule 100 mg  100 mg Oral BID   ??? bisacodyl (DULCOLAX) tablet 5 mg  5 mg Oral DAILY PRN   ??? bisacodyl (DULCOLAX) suppository 10 mg  10 mg Rectal DAILY PRN   ??? oxycodone-acetaminophen (PERCOCET) 5-325 mg per tablet 2 Tab  2 Tab Oral Q6H PRN   ??? morphine injection 2 mg  2 mg IntraVENous Q4H PRN   ??? glucose chewable tablet 12 g  12 g Oral PRN    ??? glucagon (GLUCAGEN) injection 1 mg  1 mg IntraMUSCular PRN   ??? famotidine (PEPCID) tablet 20 mg  20 mg Oral DAILY                Lab Review:     Recent Labs   Basename 10/03/09 0300 10/02/09 0410 10/01/09 0047   ??? WBC 7.2 7.8 11.0   ??? HGB 8.2* 7.7* 8.1*   ??? HCT 23.9* 23.6* 23.4*   ??? PLT 137* 130* 142*         Recent Labs   Basename 10/03/09 0300 10/02/09 0410 10/01/09 0047   ??? NA 134* 137 136   ??? K 3.8 3.8 3.5   ??? CL 100 100 100   ??? CO2 29 30 29    ??? GLU 202* 230* 103*   ??? BUN 31* 55* 83*   ??? CREA 1.5* 1.8* 2.3*   ??? CA 8.1* 8.0* 7.9*   ??? MG 1.9 2.1 2.1   ??? PHOS 1.8* 1.8* 1.6*   ??? ALB 2.9* -- --   ??? TBIL 0.4 -- --   ??? SGOT 46* -- --   ??? INR -- -- --         Lab Results   Component Value Date/Time    POC GLUCOSE 144 10/03/2009  7:51 AM    POC GLUCOSE 173 10/03/2009  1:29 AM    POC GLUCOSE 80 10/03/2009 12:51 AM    POC GLUCOSE 73 10/03/2009 12:32 AM    POC GLUCOSE 77 10/03/2009 12:28 AM                Assessment / Plan:     Patient Active Hospital Problem List:  DM:  elev bs to 541 yd, but while patient was eating lunch.  Other readings better.  lantus decreased with high sens SSI.  Hold dose today as NPO for endoscopy.    Coronary atherosclerosis of native coronary artery / NSTEMI:  Currently asymptomatic.  Continuing with heparin gtt per cards.  On ASA / plavix, metoprolol.  Cards recs medical management.  No cath for now.  Acute kidney failure, unspecified (09/29/2009):  Improving creatinine.    Anemia, unspecified (09/29/2009):  Hemoccult pos with black stool overnight.  To have EGD / colonoscopy today.  Continue h2b for now.  Transfuse for hgb < 8.    Hyperlipidemia:  On crestor plus fish oil.    Back pain (09/29/2009):  Resolved.    Hypophosphatemia:  Replete with PO phos.    Total time spent with patient: 55 Minutes                  Care Plan discussed with: Patient and Dr. Welton Flakes.    Discussed:  Care Plan    Prophylaxis:  Hep and H2B/PPI    Disposition:  Home w/Family           ___________________________________________________     Attending Physician: Suzan Garibaldi, MD

## 2009-10-03 NOTE — Progress Notes (Signed)
Corky Downs, PA in to see pt.  Informed her of meds and what medications should be given prior to procedure.  Okay with pt to get all meds but to hold Plavix and Lantus until after procedure.  Will monitor.

## 2009-10-03 NOTE — Progress Notes (Signed)
Dr Selena Batten called regarding pt's Blood Sugar.  Blood Sugar is 375.  Pt eating dinner at this time.  Pt ate lunch at 1500 and got 10 units of novolog then.  Dr Selena Batten doesn't want pt to get any more novolog at this time and to recheck blood sugar at bedtime.  1630 insulin held per MD order.

## 2009-10-03 NOTE — Progress Notes (Signed)
[facility] Progress Note    Assessment:     1.MVH:QIONGEXBM to volume depletion :osmotic diuresis with DKA in the presence of ACE and Loop   - also had low bp:70-80s:reduced EABV   -CR better with IVF,off loops and ace   2.CKD:2 to DM,HTN:Baseline cr 1.5-2.0 mg%   -cr at baseline  3.Hypotension:better   4.Proteinuria:2 to DM,needs RAS blockade once cr improves   5.Anemia:ok Iron panel and start Epo   6.DM     Plan:     1.ace if no cath planned,if intervention is planned,we will resume ace after that  2.Epo  3.no need for Iron    Subjective:     HPI: Patient seen.colonoscopy today    Current facility-administered medications   Medication Dose Route Frequency   ??? 0.9% sodium chloride infusion  75 mL/hr IntraVENous CONTINUOUS   ??? 0.9% sodium chloride infusion 250 mL  250 mL IntraVENous CONTINUOUS   ??? Potassium & Sodium Phosphates (NEUTRA-PHOS) packet 2 Packet  2 Packet Oral ONCE   ??? insulin glargine (LANTUS) injection 15 Units  15 Units SubCUTAneous DAILY   ??? bisacodyl (DULCOLAX) tablet 10 mg  10 mg Oral NOW   ??? PEG 3350-Electrolytes (COLYTE;GOLYTELY) oral solution 2 L  2 L Oral ONCE   ??? epoetin alfa (EPOGEN;PROCRIT) injection 10,000 Units  10,000 Units SubCUTAneous Q7D   ??? insulin aspart (NOVOLOG) 100 unit/mL injection 6 Units  6 Units SubCUTAneous TIDAC   ??? metoprolol (LOPRESSOR) tablet 50 mg  50 mg Oral BID   ??? aspirin chewable tablet 162 mg  162 mg Oral DAILY   ??? heparin 25,000 units in D5W 250 ml infusion  12-25 Units/kg/hr IntraVENous TITRATE   ??? heparin (porcine) injection 4,000 Units  4,000 Units IntraVENous PRN   ??? heparin (porcine) injection 2,000 Units  2,000 Units IntraVENous PRN   ??? dextrose (D50W) injection Syrg 25 g  25 g IntraVENous PRN   ??? insulin aspart (NOVOLOG)   SubCUTAneous AC&HS   ??? clopidogrel (PLAVIX) tablet 75 mg  75 mg Oral DAILY   ??? levothyroxine (SYNTHROID) tablet 150 mcg  150 mcg Oral ACB   ??? nitroglycerin (NITROSTAT) tablet 0.4 mg  0.4 mg SubLINGual Q5MIN PRN    ??? fish oil-omega-3 fatty acids 340-1,000 mg capsule 2 Cap  2 Cap Oral BID   ??? rosuvastatin (CRESTOR) tablet 20 mg  20 mg Oral DAILY   ??? terazosin (HYTRIN) capsule 10 mg  10 mg Oral QHS   ??? docusate sodium (COLACE) capsule 100 mg  100 mg Oral BID   ??? bisacodyl (DULCOLAX) tablet 5 mg  5 mg Oral DAILY PRN   ??? bisacodyl (DULCOLAX) suppository 10 mg  10 mg Rectal DAILY PRN   ??? oxycodone-acetaminophen (PERCOCET) 5-325 mg per tablet 2 Tab  2 Tab Oral Q6H PRN   ??? morphine injection 2 mg  2 mg IntraVENous Q4H PRN   ??? glucose chewable tablet 12 g  12 g Oral PRN   ??? glucagon (GLUCAGEN) injection 1 mg  1 mg IntraMUSCular PRN   ??? famotidine (PEPCID) tablet 20 mg  20 mg Oral DAILY          Review of Systems  Pertinent items are noted in HPI.    Objective:     Vitals:  Blood pressure 131/72, pulse 89, temperature 98.2 ??F (36.8 ??C), resp. rate 18, height 5\' 6"  (1.676 m), weight 166 lb (75.297 kg), SpO2 99.00%.  Temp (24hrs), Avg:98.3 ??F (36.8 ??C), Min:98.1 ??F (36.7 ??C),  Max:98.5 ??F (36.9 ??C)      Intake and Output:  In: -   Out: 150 [Urine:150]  In: 3523.4 [P.O.:2980; I.V.:213.4]  Out: 1931 [Urine:1930]    Physical Exam:                Patient is not currently intubated    GEN: NAD   RS: CTA, negative for crackles   CVS: S1S2  ABD: Soft, Non Tender  GU:  No Foley   EXT: No Edema  SKIN: No Rash   NEURO: Grossly focal      ECG: unchanged from previous tracings     Data Review        No results found for this basename: ITNL in the last 72 hours   Recent Labs   Basename 10/01/09 0047 09/30/09 1600   ??? CPK 659* 861*   ??? CKMB -- --   ??? TROIQ 19.16* 23.23*       Recent Labs   Basename 10/03/09 0300 10/02/09 0410 10/01/09 0047   ??? NA 134* 137 136   ??? K 3.8 3.8 3.5   ??? CL 100 100 100   ??? CO2 29 30 29    ??? BUN 31* 55* 83*   ??? CREA 1.5* 1.8* 2.3*   ??? GLU 202* 230* 103*   ??? PHOS 1.8* 1.8* 1.6*   ??? MG 1.9 2.1 2.1   ??? ALB 2.9* -- --   ??? WBC 7.2 7.8 11.0   ??? HGB 8.2* 7.7* 8.1*   ??? HCT 23.9* 23.6* 23.4*   ??? PLT 137* 130* 142*          No results found for this basename: INR:3,PTP:3,APTT:3, in the last 72 hours  Needs: urine analysis, urine sodium, protein and creatinine  Lab Results   Component Value Date/Time    Creatinine,urine random 33.7 05/31/2009  7:20 PM           Discussed with:    Patient

## 2009-10-03 NOTE — Progress Notes (Signed)
TRANSFER - OUT REPORT:    Verbal report given to Morrie Sheldon, RN(name) on Travis [name]  being transferred to ENDO(unit) for routine progression of care       Report consisted of patient???s Situation, Background, Assessment and   Recommendations(SBAR).     Information from the following report(s) SBAR, Kardex and MAR was reviewed with the receiving nurse.    Opportunity for questions and clarification was provided.    Pt going for colonoscopy and EGD.  Consents signed.  Heparin drip infusing at this time

## 2009-10-03 NOTE — Progress Notes (Signed)
TRANSFER - IN REPORT:    Verbal report received from [facility], RN(name) on Travis [name]  being received from endo(unit) for routine post - op      Report consisted of patient???s Situation, Background, Assessment and   Recommendations(SBAR).     Information from the following report(s) Procedure Summary was reviewed with the receiving nurse.    Opportunity for questions and clarification was provided.      Assessment completed upon patient???s arrival to unit and care assumed.

## 2009-10-03 NOTE — Progress Notes (Signed)
PTT 63.7, therapeutic.  No change in rate.  Next PTT in 6 hours.

## 2009-10-03 NOTE — Progress Notes (Signed)
Bedside and Verbal shift change report given to Jamison Oka, RN (oncoming nurse) by Sheliah Mends, RN (offgoing nurse).  Report given with SBAR, Kardex, Procedure Summary, Intake/Output, MAR and Recent Results.

## 2009-10-03 NOTE — Procedures (Signed)
[place] - [facility]  Delana Meyer, M.D.  [phone]        10/03/2009                EGD Operative Report  Travis [name]  DOB:  [birth year]  [facility] Medical Record Number:  [id number]      Indication:  Iron deficiency anemia - 280.9     Operator: Arman Filter, MD    Referring Provider:  Jeral Fruit, MD      Anesthesia/Sedation:  Versed 4 mg IV and Fentanyl 50 mcg IV    Airway assessment: No airway problems anticipated    Pre-Procedural Exam:      Airway: clear, no airway problems anticipated  Heart: RRR, without gallops or rubs  Lungs: clear bilaterally without wheezes, crackles, or rhonchi  Abdomen: soft, nontender, nondistended, bowel sounds present  Mental Status: awake, alert and oriented to person, place and time       Procedure Details     After infomed consent was obtained for the procedure, with all risks and benefits of procedure explained the patient was taken to the endoscopy suite and placed in the left lateral decubitus position.  Following sequential administration of sedation as per above, the endoscope was inserted into the mouth and advanced under direct vision to second portion of the duodenum.  A careful inspection was made as the gastroscope was withdrawn, including a retroflexed view of the proximal stomach; findings and interventions are described below.      Findings:   Esophagus:normal  Stomach: hiatal hernia and mild erythema was noted in  antrum  Duodenum/jejunum:  moderate duodenitis was located  proximal bulb, distal bulb, with superficial ulcers seen, no active bleeding seen..    Therapies:  none    Specimens: none           Complications:   None; patient tolerated the procedure well.    EBL:  None.           Impression:    Hiatal hernia, mild gasitis, duodenitis with ulcerations, colon polyp, hemorrhoids      Recommendations:    -Acid suppression with a proton pump inhibitor daily  -Obtain H.pylori serology and treat with antibiotics if positive.   -Continue anti-coagulation    [name], MD

## 2009-10-03 NOTE — Progress Notes (Signed)
PTT has been therapeutic x2.  Pharmacy called and next PTT will be in AM

## 2009-10-04 LAB — CBC W/O DIFF
HCT: 25.8 % — ABNORMAL LOW (ref 36.6–50.3)
HGB: 8.7 g/dL — ABNORMAL LOW (ref 12.1–17.0)
MCH: 28.8 PG (ref 26.0–34.0)
MCHC: 33.7 g/dL (ref 30.0–36.5)
MCV: 85.4 FL (ref 80.0–99.0)
PLATELET: 138 10*3/uL — ABNORMAL LOW (ref 150–400)
RBC: 3.02 M/uL — ABNORMAL LOW (ref 4.10–5.70)
RDW: 16.3 % — ABNORMAL HIGH (ref 11.5–14.5)
WBC: 7.4 10*3/uL (ref 4.1–11.1)

## 2009-10-04 LAB — METABOLIC PANEL, BASIC
Anion gap: 7 mmol/L (ref 5–15)
BUN/Creatinine ratio: 15 (ref 12–20)
BUN: 20 MG/DL (ref 6–20)
CO2: 26 MMOL/L (ref 21–32)
Calcium: 8.3 MG/DL — ABNORMAL LOW (ref 8.5–10.1)
Chloride: 103 MMOL/L (ref 97–108)
Creatinine: 1.3 MG/DL (ref 0.6–1.3)
GFR est AA: >60 mL/min/{1.73_m2} (ref >60)
GFR est non-AA: 58 mL/min/{1.73_m2} — ABNORMAL LOW (ref >60)
Glucose: 221 MG/DL — ABNORMAL HIGH (ref 65–100)
Potassium: 4.1 MMOL/L (ref 3.5–5.1)
Sodium: 136 MMOL/L (ref 136–145)

## 2009-10-04 LAB — GLUCOSE, POC
Glucose (POC): 469 mg/dL — ABNORMAL HIGH (ref 75–110)
Glucose (POC): 486 mg/dL — ABNORMAL HIGH (ref 75–110)
Glucose (POC): 421 mg/dL — ABNORMAL HIGH (ref 75–110)
Glucose (POC): 277 mg/dL — ABNORMAL HIGH (ref 75–110)
Glucose (POC): 409 mg/dL — ABNORMAL HIGH (ref 75–110)
Glucose (POC): 171 mg/dL — ABNORMAL HIGH (ref 75–110)

## 2009-10-04 LAB — PTT: aPTT: 60.7 s — ABNORMAL HIGH (ref 24.0–33.0)

## 2009-10-04 LAB — PROTHROMBIN TIME + INR
INR: 1.1 (ref 0.9–1.1)
Prothrombin time: 11.1 SECS — ABNORMAL HIGH (ref 9.0–11.0)

## 2009-10-04 LAB — PHOSPHORUS: Phosphorus: 2.1 MG/DL — ABNORMAL LOW (ref 2.5–4.9)

## 2009-10-04 LAB — MAGNESIUM: Magnesium: 1.8 MG/DL (ref 1.6–2.4)

## 2009-10-04 MED ORDER — WARFARIN 5 MG TAB
5 mg | Freq: Once | ORAL | Status: AC
Start: 2009-10-04 — End: 2009-10-04
  Administered 2009-10-04: 23:00:00 via ORAL

## 2009-10-04 MED ORDER — POTASSIUM & SODIUM PHOSPHATES 280 MG-160 MG-250 MG ORAL POWDER PACKET
280-160-250 mg | Freq: Once | ORAL | Status: AC
Start: 2009-10-04 — End: 2009-10-04
  Administered 2009-10-04: 14:00:00 via ORAL

## 2009-10-04 MED ORDER — PHARMACY WARFARIN NOTE
Status: DC | PRN
Start: 2009-10-04 — End: 2009-10-12

## 2009-10-04 MED ORDER — METOPROLOL TARTRATE 25 MG TAB
25 mg | Freq: Two times a day (BID) | ORAL | Status: DC
Start: 2009-10-04 — End: 2009-10-05
  Administered 2009-10-04 (×2): via ORAL

## 2009-10-04 MED ORDER — AMLODIPINE 5 MG TAB
5 mg | Freq: Every day | ORAL | Status: AC
Start: 2009-10-04 — End: 2009-10-03
  Administered 2009-10-04: 01:00:00 via ORAL

## 2009-10-04 MED ORDER — INSULIN GLARGINE 100 UNIT/ML INJECTION
100 unit/mL | Freq: Every day | SUBCUTANEOUS | Status: DC
Start: 2009-10-04 — End: 2009-10-12
  Administered 2009-10-05 – 2009-10-12 (×8): via SUBCUTANEOUS

## 2009-10-04 MED ORDER — LISINOPRIL 5 MG TAB
5 mg | Freq: Every day | ORAL | Status: DC
Start: 2009-10-04 — End: 2009-10-13
  Administered 2009-10-04 – 2009-10-12 (×9): via ORAL

## 2009-10-04 MED ORDER — BUMETANIDE 1 MG TAB
1 mg | Freq: Two times a day (BID) | ORAL | Status: DC
Start: 2009-10-04 — End: 2009-10-08
  Administered 2009-10-04 – 2009-10-07 (×8): via ORAL

## 2009-10-04 MED FILL — INSULIN ASPART 100 UNIT/ML INJECTION: 100 unit/mL | SUBCUTANEOUS | Qty: 1

## 2009-10-04 MED FILL — DOK 100 MG CAPSULE: 100 mg | ORAL | Qty: 1

## 2009-10-04 MED FILL — LISINOPRIL 5 MG TAB: 5 mg | ORAL | Qty: 2

## 2009-10-04 MED FILL — BUMETANIDE 1 MG TAB: 1 mg | ORAL | Qty: 1

## 2009-10-04 MED FILL — COUMADIN 5 MG TABLET: 5 mg | ORAL | Qty: 1

## 2009-10-04 MED FILL — PLAVIX 75 MG TABLET: 75 mg | ORAL | Qty: 1

## 2009-10-04 MED FILL — INSULIN GLARGINE 100 UNIT/ML INJECTION: 100 unit/mL | SUBCUTANEOUS | Qty: 0.15

## 2009-10-04 MED FILL — ASPIRIN 81 MG CHEWABLE TAB: 81 mg | ORAL | Qty: 2

## 2009-10-04 MED FILL — METOPROLOL TARTRATE 50 MG TAB: 50 mg | ORAL | Qty: 1

## 2009-10-04 MED FILL — PHARMACY WARFARIN NOTE: Qty: 1

## 2009-10-04 MED FILL — AMLODIPINE 5 MG TAB: 5 mg | ORAL | Qty: 1

## 2009-10-04 MED FILL — INSULIN GLARGINE 100 UNIT/ML INJECTION: 100 unit/mL | SUBCUTANEOUS | Qty: 0.2

## 2009-10-04 MED FILL — FISH OIL 340 MG-1,000 MG CAPSULE: 340-1000 mg | ORAL | Qty: 2

## 2009-10-04 MED FILL — FAMOTIDINE 20 MG TAB: 20 mg | ORAL | Qty: 1

## 2009-10-04 MED FILL — LEVOTHYROXINE 150 MCG TAB: 150 mcg | ORAL | Qty: 1

## 2009-10-04 MED FILL — PHOS-NAK 280 MG-160 MG-250 MG ORAL POWDER PACKET: 280-160-250 mg | ORAL | Qty: 2

## 2009-10-04 MED FILL — HEPARIN (PORCINE) IN D5W 25,000 UNIT/250 ML IV: 25000 unit/250 mL(100 unit/mL) | INTRAVENOUS | Qty: 250

## 2009-10-04 MED FILL — PANTOPRAZOLE 40 MG TAB, DELAYED RELEASE: 40 mg | ORAL | Qty: 1

## 2009-10-04 MED FILL — CRESTOR 10 MG TABLET: 10 mg | ORAL | Qty: 2

## 2009-10-04 MED FILL — TERAZOSIN 5 MG CAP: 5 mg | ORAL | Qty: 2

## 2009-10-04 NOTE — Progress Notes (Signed)
Bedside and Verbal shift change report given to ZOXWRUE RN (oncoming nurse) by Fabian Sharp RN (offgoing nurse).  Report given with SBAR, Kardex and MAR.

## 2009-10-04 NOTE — Progress Notes (Signed)
Paged on call MD. Pt blood sugar still high at 421, however patient refusing anymore insulin coverage at this time. Pt states "my sugar will drop, I know my body", "I have dropped so low before and I don't want it to happen again". Pt states he will take coverage during the day when he is awake and "can feel his body dropping".   Dr. Lebron Quam made aware of the above information.

## 2009-10-04 NOTE — Progress Notes (Signed)
DTC:  Spoke briefly with pt, he has reviewed carb counting info, no questions   at current time, also discussed sick day rules.  Pt. stops taking his Lantus insulin when  he becomes ill.  Reinforced the need for Lantus even if not eating.  At the very least  He should take half the dose of Lantus.  Pt. States an understanding.    Jquan Egelston RN, CDE

## 2009-10-04 NOTE — Progress Notes (Signed)
[facility] Pharmacy Dosing Services: 10/04/09    Consult for Warfarin Dosing by Pharmacy by Dr. Freeman Caldron provided for this 69 y.o. year old male for the indication of PVD.    Order entered for  Warfarin 5 mg at 1800 to be given today. Dose to achieve INR goal of  2-3    Significant drug interactions: None  PT/INR Lab Results   Component Value Date/Time    INR 1.1 10/04/2009  5:04 AM        Platelets Lab Results   Component Value Date/Time    PLATELET 138 10/04/2009  5:04 AM        H/H Lab Results   Component Value Date/Time    HGB 8.7 10/04/2009  5:04 AM          Pharmacy to follow daily and will provide subsequent Warfarin dosing based on clinical status.  Charolette Forward, Pharm.D.     Contact information [phone]

## 2009-10-04 NOTE — Progress Notes (Signed)
PTT 60.7, no rate changed. Next PTT due in am. Verified with Wyline Mood RN. Rate is 10 units/kg/hour, no rate change, no bolus. See MAR.

## 2009-10-04 NOTE — Progress Notes (Signed)
Bedside and Verbal shift change report given to Fabian Sharp RN (oncoming nurse) by Twanna Hy RN (offgoing nurse).  Report given with SBAR, Kardex and MAR.

## 2009-10-04 NOTE — Progress Notes (Signed)
[place] [facility]  Delana Meyer, M.D.  [phone]             GI PROGRESS NOTE      NAME: Travis [name]   DOB:  Oct 13, 1940   MRN:  [id number]       Subjective:   Eating very good, denies any abdominal pain or bleeding.      Objective:     VITALS:   Last 24hrs VS reviewed since prior progress note. Most recent are:  Visit Vitals   Item Reading   ??? BP 138/64   ??? Pulse 95   ??? Temp 98.1 ??F (36.7 ??C)   ??? Resp 18   ??? Ht 5\' 6"  (1.676 m)   ??? Wt 166 lb (75.297 kg)   ??? BMI 26.79 kg/m2   ??? SpO2 99%         Intake/Output Summary (Last 24 hours) at 10/04/09 0908  Last data filed at 10/04/09 0703   Gross per 24 hour   Intake    540 ml   Output   2100 ml   Net  -1560 ml       PHYSICAL EXAM:  General: WD, WN. Alert, cooperative, no acute distress????  HEENT: NC, Atraumatic.  PERRLA, EOMI. Anicteric sclerae.  Lungs:  CTA Bilaterally. No Wheezing/Rhonchi/Rales.  Heart:  Regular  rhythm,??   Abdomen: Soft, Non distended, Non tender. ??(+)Bowel sounds, no HSM  Extremities: No c/c/e  Neurologic:?? CN 2-12 gi, Alert and oriented X 3.  No acute neurological distress   Psych:???? Good insight.??Not anxious nor agitated.    Lab Data Reviewed:   Recent Labs   Basename 10/04/09 0504 10/03/09 0300   ??? WBC 7.4 7.2   ??? HGB 8.7* 8.2*   ??? HCT 25.8* 23.9*   ??? PLT 138* 137*       Recent Labs   Basename 10/04/09 0504 10/03/09 0300   ??? NA 136 134*   ??? K 4.1 3.8   ??? CL 103 100   ??? CO2 26 29   ??? BUN 20 31*   ??? CREA 1.3 1.5*   ??? GLU 221* 202*   ??? PHOS 2.1* 1.8*   ??? CA 8.3* 8.1*       Recent Labs   Basename 10/03/09 0300   ??? SGOT 46*   ??? GPT 29   ??? AP 85   ??? TBIL 0.4   ??? TP 7.1   ??? ALB 2.9*   ??? GLOB 4.2*   ??? GGT --   ??? AML --   ??? LPSE --         ________________________________________________________________________  Patient Active Problem List   Diagnoses Code   ??? CHF (congestive heart failure) 428.0R   ??? CAD (coronary artery disease) 414.00AE   ??? DKA (diabetic ketoacidosis) 250.10AN   ??? Coronary atherosclerosis of native coronary artery 414.01    ??? Acute kidney failure, unspecified 584.9   ??? Chronic kidney disease, stage III (moderate) 585.3   ??? Hyperkalemia 276.7A   ??? Hyposmolality and/or hyponatremia 276.1   ??? Anemia, unspecified 285.9   ??? Back pain 724.5E   ??? Acute myocardial infarction, unspecified site, initial episode of care 410.91          Assessment:   ?? Anemia, S/P EGD showing gastritis and duodenitis with ulcerations. Colonoscopy showed a small polyp, no other significant findings.   Plan:   ?? Will need to stay on PPI separated by 12 hours  from Plavix and at least in the short term period H2-blocker.  ?? Will need to monitor hemoglobin and see as outpatient in 2 weeks. Still can not rule out small bowel source.     Signed By: Arman Filter, MD     10/04/2009  9:08 AM

## 2009-10-04 NOTE — Progress Notes (Signed)
S: Feels fine today  O:Physical Exam:   BP 137/61   Pulse 106   Temp 98.6 ??F (37 ??C)   Resp 18   Ht 5\' 6"  (1.676 m)   Wt 166 lb (75.297 kg)   BMI 26.79 kg/m2   SpO2 99%   Patient appears generally well, mood and affect are appropriate and pleasant.   HEENT: Normocephalic, atraumatic.   Neck Exam: Supple,   Lung Exam: Clear to auscultation, even breath sounds.   Cardiac Exam: Regular rate and rhythm with 1-2/6 systolic murmur.   Abdomen: Soft, non-tender, non-distended, normal bowel sounds.   Extremities: No pitting lower extremity edema.   Vasc - poor DP bilat    Recent Results (from the past 24 hour(s))   GLUCOSE, POC    Collection Time    10/03/09  5:25 PM   Component Value Range   ??? POC GLUCOSE 375 (*) 75 - 110 (mg/dL)   GLUCOSE, POC    Collection Time    10/03/09  8:17 PM   Component Value Range   ??? POC GLUCOSE 469 (*) 75 - 110 (mg/dL)   GLUCOSE, POC    Collection Time    10/03/09  8:19 PM   Component Value Range   ??? POC GLUCOSE 486 (*) 75 - 110 (mg/dL)   GLUCOSE, POC    Collection Time    10/04/09 12:01 AM   Component Value Range   ??? POC GLUCOSE 421 (*) 75 - 110 (mg/dL)   CBC W/O DIFF    Collection Time    10/04/09  5:04 AM   Component Value Range   ??? WBC 7.4  4.1 - 11.1 (K/uL)   ??? RBC 3.02 (*) 4.10 - 5.70 (M/uL)   ??? HGB 8.7 (*) 12.1 - 17.0 (g/dL)   ??? HCT 25.8 (*) 36.6 - 50.3 (%)   ??? MCV 85.4  80.0 - 99.0 (FL)   ??? MCH 28.8  26.0 - 34.0 (PG)   ??? MCHC 33.7  30.0 - 36.5 (g/dL)   ??? RDW 16.3 (*) 11.5 - 14.5 (%)   ??? PLATELET 138 (*) 150 - 400 (K/uL)   METABOLIC PANEL, BASIC    Collection Time    10/04/09  5:04 AM   Component Value Range   ??? Sodium 136  136 - 145 (MMOL/L)   ??? Potassium 4.1  3.5 - 5.1 (MMOL/L)   ??? Chloride 103  97 - 108 (MMOL/L)   ??? CO2 26  21 - 32 (MMOL/L)   ??? Anion gap 7  5 - 15 (mmol/L)   ??? Glucose 221 (*) 65 - 100 (MG/DL)   ??? BUN 20  6 - 20 (MG/DL)   ??? Creatinine 1.3  0.6 - 1.3 (MG/DL)   ??? BUN/Creatinine ratio 15  12 - 20 ( )   ??? GFR est AA >60  >60 (ml/min/1.37m2)    ??? GFR est non-AA 58 (*) >60 (ml/min/1.25m2)   ??? Calcium 8.3 (*) 8.5 - 10.1 (MG/DL)   PHOSPHORUS    Collection Time    10/04/09  5:04 AM   Component Value Range   ??? Phosphorus 2.1 (*) 2.5 - 4.9 (MG/DL)   MAGNESIUM    Collection Time    10/04/09  5:04 AM   Component Value Range   ??? Magnesium 1.8  1.6 - 2.4 (MG/DL)   PTT    Collection Time    10/04/09  5:04 AM   Component Value Range   ??? aPTT 60.7 (*) 24.0 -  33.0 (sec)   PROTHROMBIN TIME    Collection Time    10/04/09  5:04 AM   Component Value Range   ??? INR 1.1  0.9 - 1.1 ( )   ??? Prothrombin Time-PT 11.1 (*) 9.0 - 11.0 (SECS)   GLUCOSE, POC    Collection Time    10/04/09  7:24 AM   Component Value Range   ??? POC GLUCOSE 277 (*) 75 - 110 (mg/dL)   GLUCOSE, POC    Collection Time    10/04/09 11:56 AM   Component Value Range   ??? POC GLUCOSE 409 (*) 75 - 110 (mg/dL)       Assessment and Plan:   1) s/p NSTEMI   - he's had recurrent NSTEMI's the past few months. He has a chronic LCX lesion which has not been amenable to PCI (noted the past 3 caths), which I suspect is the culprit territory. After reviewing the films again with interventional cardiology (Dr. Elmon Kirschner), Mr. Galen territory is not amenable to PCI and it is felt that attempting to revascularize the LCX territory would have more risk than benefit.   He has been free of chest pain complaints past several days.  Will continue medical management. I may consider an adenosine myoview later (outpatient) to assess myocardium at risk and rule out ischemia in other territories.   2) Chronic edema - Restarted Bumex (at 1 mg PO BID)   3) HTN - Norvasc restarted   4) PVD - will need coumadin for about 3 months     Tharon Aquas, MD, [facility]

## 2009-10-04 NOTE — Progress Notes (Signed)
[facility] Progress Note    Assessment:     1.ZOX:[id number].  -secondary to volume depletion :osmotic diuresis with DKA in the presence of ACE and Loop   - also had low bp:70-80s:reduced EABV   -CR better with IVF,off loops and ace   2.CKD:2 to DM,HTN:Baseline cr 1.5-2.0 mg%   -cr at baseline  3.Hypotension:better   4.Proteinuria:2 to DM,needs RAS blockade once cr improves   5.Anemia:ok Iron panel and start Epo   6.DM     Plan:     1.we will resume ace   2.Epo out  patient  3.no need for Iron  4.kvo ivf    Subjective:     HPI: Patient seen.    Patient denies any Chest pain/palpitations.    Patient denies any shortness of breath,cough,phlem.      Current facility-administered medications   Medication Dose Route Frequency   ??? Potassium & Sodium Phosphates (NEUTRA-PHOS) packet 2 Packet  2 Packet Oral ONCE   ??? Potassium & Sodium Phosphates (NEUTRA-PHOS) packet 2 Packet  2 Packet Oral ONCE   ??? insulin aspart (NOVOLOG) 100 unit/mL injection 6 Units  6 Units SubCUTAneous TIDAC   ??? insulin glargine (LANTUS) injection 15 Units  15 Units SubCUTAneous DAILY   ??? pantoprazole (PROTONIX) tablet 40 mg  40 mg Oral ACD   ??? amlodipine (NORVASC) tablet 5 mg  5 mg Oral DAILY   ??? epoetin alfa (EPOGEN;PROCRIT) injection 10,000 Units  10,000 Units SubCUTAneous Q7D   ??? metoprolol (LOPRESSOR) tablet 50 mg  50 mg Oral BID   ??? aspirin chewable tablet 162 mg  162 mg Oral DAILY   ??? heparin 25,000 units in D5W 250 ml infusion  12-25 Units/kg/hr IntraVENous TITRATE   ??? heparin (porcine) injection 4,000 Units  4,000 Units IntraVENous PRN   ??? heparin (porcine) injection 2,000 Units  2,000 Units IntraVENous PRN   ??? dextrose (D50W) injection Syrg 25 g  25 g IntraVENous PRN   ??? insulin aspart (NOVOLOG)   SubCUTAneous AC&HS   ??? clopidogrel (PLAVIX) tablet 75 mg  75 mg Oral DAILY   ??? levothyroxine (SYNTHROID) tablet 150 mcg  150 mcg Oral ACB   ??? nitroglycerin (NITROSTAT) tablet 0.4 mg  0.4 mg SubLINGual Q5MIN PRN    ??? fish oil-omega-3 fatty acids 340-1,000 mg capsule 2 Cap  2 Cap Oral BID   ??? rosuvastatin (CRESTOR) tablet 20 mg  20 mg Oral DAILY   ??? terazosin (HYTRIN) capsule 10 mg  10 mg Oral QHS   ??? docusate sodium (COLACE) capsule 100 mg  100 mg Oral BID   ??? bisacodyl (DULCOLAX) tablet 5 mg  5 mg Oral DAILY PRN   ??? bisacodyl (DULCOLAX) suppository 10 mg  10 mg Rectal DAILY PRN   ??? oxycodone-acetaminophen (PERCOCET) 5-325 mg per tablet 2 Tab  2 Tab Oral Q6H PRN   ??? morphine injection 2 mg  2 mg IntraVENous Q4H PRN   ??? glucose chewable tablet 12 g  12 g Oral PRN   ??? glucagon (GLUCAGEN) injection 1 mg  1 mg IntraMUSCular PRN   ??? famotidine (PEPCID) tablet 20 mg  20 mg Oral DAILY            Review of Systems  Pertinent items are noted in HPI.    Objective:     Vitals:  Blood pressure 142/65, pulse 91, temperature 98.7 ??F (37.1 ??C), resp. rate 18, height 5\' 6"  (1.676 m), weight 166 lb (75.297 kg), SpO2 96.00%.  Temp (24hrs),  Avg:98.3 ??F (36.8 ??C), Min:97.8 ??F (36.6 ??C), Max:98.7 ??F (37.1 ??C)      Intake and Output:  In: -   Out: 300 [Urine:300]  In: 1569.4 [P.O.:1540; I.V.:29.4]  Out: 2251 [Urine:2250]    Physical Exam:                Patient is not currently intubated    GEN: NAD   RS: CTA, negative for crackles   CVS: S1S2  ABD: Soft, Non Tender  GU:  No Foley   EXT: No Edema  SKIN: No Rash   NEURO: Grossly focal      ECG: unchanged from previous tracings     Data Review        No results found for this basename: ITNL in the last 72 hours     No results found for this basename: CPK:3,CKMB:3,TROIQ:3, in the last 72 hours    Recent Labs   Basename 10/04/09 0504 10/03/09 0300 10/02/09 0410   ??? NA 136 134* 137   ??? K 4.1 3.8 3.8   ??? CL 103 100 100   ??? CO2 26 29 30    ??? BUN 20 31* 55*   ??? CREA 1.3 1.5* 1.8*   ??? GLU 221* 202* 230*   ??? PHOS 2.1* 1.8* 1.8*   ??? MG 1.8 1.9 2.1   ??? ALB -- 2.9* --   ??? WBC 7.4 7.2 7.8   ??? HGB 8.7* 8.2* 7.7*   ??? HCT 25.8* 23.9* 23.6*   ??? PLT 138* 137* 130*            No results found for this basename: INR:3,PTP:3,APTT:3, in the last 72 hours  Needs: urine analysis, urine sodium, protein and creatinine  Lab Results   Component Value Date/Time    Creatinine,urine random 33.7 05/31/2009  7:20 PM             Discussed with:    Patient

## 2009-10-04 NOTE — Progress Notes (Addendum)
[place] [facility]  [address] [place], [place] [address]  [phone]      Medical Progress Note      NAME: Travis [name]   DOB:  [birth year]  MRM:  [id number]    Date/Time: 10/04/2009  7:48 AM         Subjective:     Chief Complaint:  "i feel fine"    Denies cp or sob.  No n/v.  No abd pain.  Had elevated bs during the night, but declined full doses of SSI due to concern of having drop in bs.    ROS:  (bold if positive, if negative)      Tolerating Diet          Objective:       Vitals:          Last 24hrs VS reviewed since prior progress note. Most recent are:    Visit Vitals   Item Reading   ??? BP 142/65   ??? Pulse 91   ??? Temp 98.7 ??F (37.1 ??C)   ??? Resp 18   ??? Ht 5\' 6"    ??? Wt 166 lb   ??? BMI 26.79 kg/m2   ??? SpO2 96%         SpO2 Readings from Last 6 Encounters:   10/04/09 96%   10/04/09 96%   03/31/09 94%        O2 Flow Rate (L/min): 2 l/min     Intake/Output Summary (Last 24 hours) at 10/04/09 0748  Last data filed at 10/04/09 0703   Gross per 24 hour   Intake    540 ml   Output   2250 ml   Net  -1710 ml            Exam:     Physical Exam:    Gen:  Well-developed, well-nourished, in no acute distress  HEENT:  Pink conjunctivae, PERRL, hearing intact to voice, moist mucous membranes  Neck:  Supple, without masses, thyroid non-tender  Resp:  No accessory muscle use, clear breath sounds without wheezes rales or rhonchi  Card:  Murmur present, normal S1, S2 without thrills.  +gallop.  Trace le edema.  Abd:  Soft, non-tender, non-distended, normoactive bowel sounds are present  Musc:  No cyanosis or clubbing  Skin:  No rashes or ulcers, skin turgor is good  Neuro:  Cranial nerves 3-12 are grossly intact, grip strength is 5/5 bilaterally and dorsi / plantarflexion is 5/5 bilaterally, follows commands appropriately  Psych:  Good insight, oriented to person, place and time, alert       Medications Reviewed: (see below)    Lab Data Reviewed: (see below)     ______________________________________________________________________    Medications:     Current facility-administered medications   Medication Dose Route Frequency   ??? 0.9% sodium chloride infusion  75 mL/hr IntraVENous CONTINUOUS   ??? Potassium & Sodium Phosphates (NEUTRA-PHOS) packet 2 Packet  2 Packet Oral ONCE   ??? insulin aspart (NOVOLOG) 100 unit/mL injection 6 Units  6 Units SubCUTAneous TIDAC   ??? insulin glargine (LANTUS) injection 15 Units  15 Units SubCUTAneous DAILY   ??? pantoprazole (PROTONIX) tablet 40 mg  40 mg Oral ACD   ??? amlodipine (NORVASC) tablet 5 mg  5 mg Oral DAILY   ??? 0.9% sodium chloride infusion 250 mL  250 mL IntraVENous CONTINUOUS   ??? epoetin alfa (EPOGEN;PROCRIT) injection 10,000 Units  10,000 Units SubCUTAneous Q7D   ???  metoprolol (LOPRESSOR) tablet 50 mg  50 mg Oral BID   ??? aspirin chewable tablet 162 mg  162 mg Oral DAILY   ??? heparin 25,000 units in D5W 250 ml infusion  12-25 Units/kg/hr IntraVENous TITRATE   ??? heparin (porcine) injection 4,000 Units  4,000 Units IntraVENous PRN   ??? heparin (porcine) injection 2,000 Units  2,000 Units IntraVENous PRN   ??? dextrose (D50W) injection Syrg 25 g  25 g IntraVENous PRN   ??? insulin aspart (NOVOLOG)   SubCUTAneous AC&HS   ??? clopidogrel (PLAVIX) tablet 75 mg  75 mg Oral DAILY   ??? levothyroxine (SYNTHROID) tablet 150 mcg  150 mcg Oral ACB   ??? nitroglycerin (NITROSTAT) tablet 0.4 mg  0.4 mg SubLINGual Q5MIN PRN   ??? fish oil-omega-3 fatty acids 340-1,000 mg capsule 2 Cap  2 Cap Oral BID   ??? rosuvastatin (CRESTOR) tablet 20 mg  20 mg Oral DAILY   ??? terazosin (HYTRIN) capsule 10 mg  10 mg Oral QHS   ??? docusate sodium (COLACE) capsule 100 mg  100 mg Oral BID   ??? bisacodyl (DULCOLAX) tablet 5 mg  5 mg Oral DAILY PRN   ??? bisacodyl (DULCOLAX) suppository 10 mg  10 mg Rectal DAILY PRN   ??? oxycodone-acetaminophen (PERCOCET) 5-325 mg per tablet 2 Tab  2 Tab Oral Q6H PRN   ??? morphine injection 2 mg  2 mg IntraVENous Q4H PRN    ??? glucose chewable tablet 12 g  12 g Oral PRN   ??? glucagon (GLUCAGEN) injection 1 mg  1 mg IntraMUSCular PRN   ??? famotidine (PEPCID) tablet 20 mg  20 mg Oral DAILY         Lab Review:     Recent Labs   Basename 10/04/09 0504 10/03/09 0300 10/02/09 0410   ??? WBC 7.4 7.2 7.8   ??? HGB 8.7* 8.2* 7.7*   ??? HCT 25.8* 23.9* 23.6*   ??? PLT 138* 137* 130*         Recent Labs   Basename 10/04/09 0504 10/03/09 0300 10/02/09 0410   ??? NA 136 134* 137   ??? K 4.1 3.8 3.8   ??? CL 103 100 100   ??? CO2 26 29 30    ??? GLU 221* 202* 230*   ??? BUN 20 31* 55*   ??? CREA 1.3 1.5* 1.8*   ??? CA 8.3* 8.1* 8.0*   ??? MG 1.8 1.9 2.1   ??? PHOS 2.1* 1.8* 1.8*   ??? ALB -- 2.9* --   ??? TBIL -- 0.4 --   ??? SGOT -- 46* --   ??? INR -- -- --         Lab Results   Component Value Date/Time    POC GLUCOSE 421 10/04/2009 12:01 AM    POC GLUCOSE 486 10/03/2009  8:19 PM    POC GLUCOSE 469 10/03/2009  8:17 PM    POC GLUCOSE 375 10/03/2009  5:25 PM    POC GLUCOSE 253 10/03/2009 12:01 PM                Assessment / Plan:     Patient Active Hospital Problem List:  DM:  Elevated bs overnight, but had late lunch with dinner soon after.  rec'd lantus late due to NPO status.  Patient declined full SSI coverage.  BS this AM improved.  Monitor with more regular schedule of lantus and aspart today.    Coronary atherosclerosis of native coronary artery / NSTEMI /  HTN:  Currently asymptomatic.  Continuing with heparin gtt per cards.  On ASA / plavix, metoprolol.  Cards recs medical management.  No cath for now.  norvasc added overnight due to elevated BP.  Pharmacy to restart coumadin with goal to d/c heparin.    Acute kidney failure, unspecified (09/29/2009):  Creatinine improved.  Seems to be at baseline.    Anemia, unspecified (09/29/2009):  Hemoccult pos with black stool overnight.  EGD with erythema and superficial ulcer.  PPI started along with H2B.  hgb improved overnight despite anticoagulation.    Hyperlipidemia:  On crestor plus fish oil.    Hypophosphatemia:  Replete with PO phos.     Total time spent with patient: 25 Minutes                  Care Plan discussed with: Patient    Discussed:  Care Plan    Prophylaxis:  Hep and H2B/PPI    Disposition:  Home w/Family           ___________________________________________________    Attending Physician: Suzan Garibaldi, MD

## 2009-10-05 LAB — GLUCOSE, POC
Glucose (POC): 174 mg/dL — ABNORMAL HIGH (ref 75–110)
Glucose (POC): 235 mg/dL — ABNORMAL HIGH (ref 75–110)
Glucose (POC): 299 mg/dL — ABNORMAL HIGH (ref 75–110)
Glucose (POC): 196 mg/dL — ABNORMAL HIGH (ref 75–110)

## 2009-10-05 LAB — CBC W/O DIFF
HCT: 25.3 % — ABNORMAL LOW (ref 36.6–50.3)
HGB: 8.4 g/dL — ABNORMAL LOW (ref 12.1–17.0)
MCH: 29.1 PG (ref 26.0–34.0)
MCHC: 33.2 g/dL (ref 30.0–36.5)
MCV: 87.5 FL (ref 80.0–99.0)
PLATELET: 148 10*3/uL — ABNORMAL LOW (ref 150–400)
RBC: 2.89 M/uL — ABNORMAL LOW (ref 4.10–5.70)
RDW: 16.6 % — ABNORMAL HIGH (ref 11.5–14.5)
WBC: 6.4 10*3/uL (ref 4.1–11.1)

## 2009-10-05 LAB — METABOLIC PANEL, BASIC
Anion gap: 8 mmol/L (ref 5–15)
BUN/Creatinine ratio: 15 (ref 12–20)
BUN: 20 MG/DL (ref 6–20)
CO2: 28 MMOL/L (ref 21–32)
Calcium: 8.3 MG/DL — ABNORMAL LOW (ref 8.5–10.1)
Chloride: 103 MMOL/L (ref 97–108)
Creatinine: 1.3 MG/DL (ref 0.6–1.3)
GFR est AA: >60 mL/min/{1.73_m2} (ref >60)
GFR est non-AA: 58 mL/min/{1.73_m2} — ABNORMAL LOW (ref >60)
Glucose: 122 MG/DL — ABNORMAL HIGH (ref 65–100)
Potassium: 3.7 MMOL/L (ref 3.5–5.1)
Sodium: 139 MMOL/L (ref 136–145)

## 2009-10-05 LAB — FOLATE: Folate: 17.7 ng/mL (ref 5.0–21.0)

## 2009-10-05 LAB — MAGNESIUM
Magnesium: 1.6 MG/DL (ref 1.6–2.4)
Magnesium: 1.8 MG/DL (ref 1.6–2.4)

## 2009-10-05 LAB — PROTHROMBIN TIME + INR
INR: 1.1 (ref 0.9–1.1)
Prothrombin time: 11.2 SECS — ABNORMAL HIGH (ref 9.0–11.0)

## 2009-10-05 LAB — PHOSPHORUS: Phosphorus: 2.8 MG/DL (ref 2.5–4.9)

## 2009-10-05 LAB — PTT: aPTT: 64.3 s — ABNORMAL HIGH (ref 24.0–33.0)

## 2009-10-05 LAB — VITAMIN B12: Vitamin B12: 2492 pg/mL — ABNORMAL HIGH (ref 254–1320)

## 2009-10-05 MED ORDER — METOPROLOL TARTRATE 50 MG TAB
50 mg | Freq: Two times a day (BID) | ORAL | Status: DC
Start: 2009-10-05 — End: 2009-10-15
  Administered 2009-10-05 – 2009-10-15 (×21): via ORAL

## 2009-10-05 MED ORDER — MAGNESIUM SULFATE IN D5W 1 GRAM/100 ML IV PIGGY BACK
1 gram/00 mL | INTRAVENOUS | Status: AC
Start: 2009-10-05 — End: 2009-10-05
  Administered 2009-10-05 (×2): via INTRAVENOUS

## 2009-10-05 MED ORDER — WARFARIN 5 MG TAB
5 mg | Freq: Once | ORAL | Status: AC
Start: 2009-10-05 — End: 2009-10-05
  Administered 2009-10-05: 21:00:00 via ORAL

## 2009-10-05 MED ORDER — AMLODIPINE 5 MG TAB
5 mg | Freq: Every day | ORAL | Status: DC
Start: 2009-10-05 — End: 2009-10-12
  Administered 2009-10-05 – 2009-10-12 (×8): via ORAL

## 2009-10-05 MED FILL — COUMADIN 5 MG TABLET: 5 mg | ORAL | Qty: 1

## 2009-10-05 MED FILL — INSULIN ASPART 100 UNIT/ML INJECTION: 100 unit/mL | SUBCUTANEOUS | Qty: 1

## 2009-10-05 MED FILL — BUMETANIDE 1 MG TAB: 1 mg | ORAL | Qty: 1

## 2009-10-05 MED FILL — MAGNESIUM SULFATE IN D5W 1 GRAM/100 ML IV PIGGY BACK: 1 gram/00 mL | INTRAVENOUS | Qty: 100

## 2009-10-05 MED FILL — METOPROLOL TARTRATE 50 MG TAB: 50 mg | ORAL | Qty: 2

## 2009-10-05 MED FILL — LEVOTHYROXINE 150 MCG TAB: 150 mcg | ORAL | Qty: 1

## 2009-10-05 MED FILL — AMLODIPINE 5 MG TAB: 5 mg | ORAL | Qty: 1

## 2009-10-05 MED FILL — FISH OIL 340 MG-1,000 MG CAPSULE: 340-1000 mg | ORAL | Qty: 2

## 2009-10-05 MED FILL — INSULIN GLARGINE 100 UNIT/ML INJECTION: 100 unit/mL | SUBCUTANEOUS | Qty: 0.2

## 2009-10-05 MED FILL — HEPARIN (PORCINE) IN D5W 25,000 UNIT/250 ML IV: 25000 unit/250 mL(100 unit/mL) | INTRAVENOUS | Qty: 250

## 2009-10-05 MED FILL — DOK 100 MG CAPSULE: 100 mg | ORAL | Qty: 1

## 2009-10-05 MED FILL — LISINOPRIL 5 MG TAB: 5 mg | ORAL | Qty: 2

## 2009-10-05 MED FILL — PLAVIX 75 MG TABLET: 75 mg | ORAL | Qty: 1

## 2009-10-05 MED FILL — ASPIRIN 81 MG CHEWABLE TAB: 81 mg | ORAL | Qty: 2

## 2009-10-05 MED FILL — PANTOPRAZOLE 40 MG TAB, DELAYED RELEASE: 40 mg | ORAL | Qty: 1

## 2009-10-05 MED FILL — CRESTOR 10 MG TABLET: 10 mg | ORAL | Qty: 2

## 2009-10-05 MED FILL — TERAZOSIN 5 MG CAP: 5 mg | ORAL | Qty: 2

## 2009-10-05 MED FILL — FAMOTIDINE 20 MG TAB: 20 mg | ORAL | Qty: 1

## 2009-10-05 NOTE — Progress Notes (Addendum)
[place] [facility]  [address] [place], [place] [address]  [phone]      Medical Progress Note      NAME: Travis [name]   DOB:  [birth year]  MRM:  [id number]    Date/Time: 10/05/2009  8:16 AM         Subjective:     Chief Complaint:  "i feel fine"    Denies cp or sob.  No bm yet since colonoscopy.  No n/v.  Tolerating diet.  Ambulatory on own.    ROS:  (bold if positive, if negative)      Tolerating Diet          Objective:       Vitals:          Last 24hrs VS reviewed since prior progress note. Most recent are:    Visit Vitals   Item Reading   ??? BP 165/67   ??? Pulse 86   ??? Temp 98.4 ??F (36.9 ??C)   ??? Resp 18   ??? Ht 5\' 6"    ??? Wt 166 lb   ??? BMI 26.79 kg/m2   ??? SpO2 98%         Intake/Output Summary (Last 24 hours) at 10/05/09 0816  Last data filed at 10/04/09 2021   Gross per 24 hour   Intake    840 ml   Output   1325 ml   Net   -485 ml            Exam:     Physical Exam:    Gen:  Well-developed, well-nourished, in no acute distress  HEENT:  Pink conjunctivae, PERRL, hearing intact to voice, moist mucous membranes  Neck:  Supple, without masses, thyroid non-tender  Resp:  No accessory muscle use, clear breath sounds without wheezes rales or rhonchi  Card:  Murmur present, normal S1, S2 without thrills.  +gallop.  Trace le edema.  dp pulses audible with doppler b/l.  Abd:  Soft, non-tender, non-distended, normoactive bowel sounds are present  Musc:  No cyanosis or clubbing  Skin:  No rashes or ulcers, skin turgor is decreased  Neuro:  Cranial nerves 3-12 are grossly intact, grip strength is 5/5 bilaterally and dorsi / plantarflexion is 5/5 bilaterally, follows commands appropriately  Psych:  Good insight, oriented to person, place and time, alert       Medications Reviewed: (see below)    Lab Data Reviewed: (see below)    ______________________________________________________________________    Medications:     Current facility-administered medications   Medication Dose Route Frequency    ??? Potassium & Sodium Phosphates (NEUTRA-PHOS) packet 2 Packet  2 Packet Oral ONCE   ??? bumetanide (BUMEX) tablet 1 mg  1 mg Oral BID   ??? metoprolol (LOPRESSOR) tablet 75 mg  75 mg Oral BID   ??? lisinopril (PRINIVIL, ZESTRIL) tablet 10 mg  10 mg Oral DAILY   ??? Warfarin: Dosing per Pharmacy   Other PRN   ??? warfarin (COUMADIN) tablet 5 mg  5 mg Oral ONCE   ??? insulin glargine (LANTUS) injection 20 Units  20 Units SubCUTAneous DAILY   ??? insulin aspart (NOVOLOG) 100 unit/mL injection 6 Units  6 Units SubCUTAneous TIDAC   ??? pantoprazole (PROTONIX) tablet 40 mg  40 mg Oral ACD   ??? epoetin alfa (EPOGEN;PROCRIT) injection 10,000 Units  10,000 Units SubCUTAneous Q7D   ??? metoprolol (LOPRESSOR) tablet 50 mg  50 mg Oral BID   ??? aspirin  chewable tablet 162 mg  162 mg Oral DAILY   ??? heparin 25,000 units in D5W 250 ml infusion  12-25 Units/kg/hr IntraVENous TITRATE   ??? heparin (porcine) injection 4,000 Units  4,000 Units IntraVENous PRN   ??? heparin (porcine) injection 2,000 Units  2,000 Units IntraVENous PRN   ??? dextrose (D50W) injection Syrg 25 g  25 g IntraVENous PRN   ??? insulin aspart (NOVOLOG)   SubCUTAneous AC&HS   ??? clopidogrel (PLAVIX) tablet 75 mg  75 mg Oral DAILY   ??? levothyroxine (SYNTHROID) tablet 150 mcg  150 mcg Oral ACB   ??? nitroglycerin (NITROSTAT) tablet 0.4 mg  0.4 mg SubLINGual Q5MIN PRN   ??? fish oil-omega-3 fatty acids 340-1,000 mg capsule 2 Cap  2 Cap Oral BID   ??? rosuvastatin (CRESTOR) tablet 20 mg  20 mg Oral DAILY   ??? terazosin (HYTRIN) capsule 10 mg  10 mg Oral QHS   ??? docusate sodium (COLACE) capsule 100 mg  100 mg Oral BID   ??? bisacodyl (DULCOLAX) tablet 5 mg  5 mg Oral DAILY PRN   ??? bisacodyl (DULCOLAX) suppository 10 mg  10 mg Rectal DAILY PRN   ??? oxycodone-acetaminophen (PERCOCET) 5-325 mg per tablet 2 Tab  2 Tab Oral Q6H PRN   ??? morphine injection 2 mg  2 mg IntraVENous Q4H PRN   ??? glucose chewable tablet 12 g  12 g Oral PRN   ??? glucagon (GLUCAGEN) injection 1 mg  1 mg IntraMUSCular PRN    ??? famotidine (PEPCID) tablet 20 mg  20 mg Oral DAILY         Lab Review:     Recent Labs   Basename 10/05/09 0505 10/04/09 0504 10/03/09 0300   ??? WBC 6.4 7.4 7.2   ??? HGB 8.4* 8.7* 8.2*   ??? HCT 25.3* 25.8* 23.9*   ??? PLT 148* 138* 137*         Recent Labs   Basename 10/05/09 0505 10/04/09 0504 10/03/09 0300   ??? NA 139 136 134*   ??? K 3.7 4.1 3.8   ??? CL 103 103 100   ??? CO2 28 26 29    ??? GLU 122* 221* 202*   ??? BUN 20 20 31*   ??? CREA 1.3 1.3 1.5*   ??? CA 8.3* 8.3* 8.1*   ??? MG 1.6 1.8 1.9   ??? PHOS 2.8 2.1* 1.8*   ??? ALB -- -- 2.9*   ??? TBIL -- -- 0.4   ??? SGOT -- -- 46*   ??? INR 1.1 1.1 --         Lab Results   Component Value Date/Time    POC GLUCOSE 235 10/05/2009  8:01 AM    POC GLUCOSE 174 10/04/2009  8:48 PM    POC GLUCOSE 171 10/04/2009  4:21 PM    POC GLUCOSE 409 10/04/2009 11:56 AM    POC GLUCOSE 277 10/04/2009  7:24 AM            Assessment / Plan:     Patient Active Hospital Problem List:  DM:  BS high yesterday despite lantus and aspart.  Will increase lantus to 20 units.  Continue aspart and SSI.    Coronary atherosclerosis of native coronary artery / NSTEMI / HTN:  Currently asymptomatic.  Continuing with heparin gtt per cards.  On ASA / plavix, metoprolol.  Cards recs medical management.  No cath for now.  norvasc added overnight due to elevated BP.  Pharmacy to restart  coumadin with goal to d/c heparin.    Acute kidney failure, unspecified (09/29/2009):  Creatinine improved.  Seems to be at baseline.    Anemia, unspecified (09/29/2009):  EGD with erythema and superficial ulcer.  PPI started along with H2B.  hgb stable overnight.    Hyperlipidemia:  On crestor plus fish oil.    Hypophosphatemia:  Repleted.    PVD:  Followed by Dr. Carmell Austria previously.  Pulses audible with doppler bilaterally.  Has been on coumadin for PVD.  Coumadin restarted.  On heparin until INR therapeutic.  Was having INR check done with [facility] office.  Discussed with Dr. Carmell Austria, recommends bridging with heparin until INR therapeutic.     Total time spent with patient: 25 Minutes                  Care Plan discussed with: Patient    Discussed:  Care Plan    Prophylaxis:  Hep and H2B/PPI    Disposition:  Home w/Family           ___________________________________________________    Attending Physician: Suzan Garibaldi, MD

## 2009-10-05 NOTE — Consults (Signed)
Consult dictated [phone]  A: Advanced PVD, high risk for right leg limb loss.  Rec: Aggressive anticoagulation with antiplt meds and coumadin, overlapping heparin while inr subtherapeutic

## 2009-10-05 NOTE — Progress Notes (Signed)
S: Feels fine today   O:Physical Exam:   BP.  BP 143/65   Pulse 91   Temp 98.6 ??F (37 ??C)   Resp 18   Ht 5\' 6"  (1.676 m)   Wt 166 lb (75.297 kg)   BMI 26.79 kg/m2   SpO2 98%   Patient appears generally well, mood and affect are appropriate and pleasant.   HEENT: Normocephalic, atraumatic.   Neck Exam: Supple,   Lung Exam: Clear to auscultation, even breath sounds.   Cardiac Exam: Regular rate and rhythm with 1-2/6 systolic murmur.   Abdomen: Soft, non-tender, non-distended, normal bowel sounds.   Extremities: mild lower extremity edema.   Vasc - poor DP bilat    Recent Results (from the past 24 hour(s))   GLUCOSE, POC    Collection Time    10/04/09  4:21 PM   Component Value Range   ??? POC GLUCOSE 171 (*) 75 - 110 (mg/dL)   GLUCOSE, POC    Collection Time    10/04/09  8:48 PM   Component Value Range   ??? POC GLUCOSE 174 (*) 75 - 110 (mg/dL)   PTT    Collection Time    10/05/09  5:05 AM   Component Value Range   ??? aPTT 64.3 (*) 24.0 - 33.0 (sec)   CBC W/O DIFF    Collection Time    10/05/09  5:05 AM   Component Value Range   ??? WBC 6.4  4.1 - 11.1 (K/uL)   ??? RBC 2.89 (*) 4.10 - 5.70 (M/uL)   ??? HGB 8.4 (*) 12.1 - 17.0 (g/dL)   ??? HCT 25.3 (*) 36.6 - 50.3 (%)   ??? MCV 87.5  80.0 - 99.0 (FL)   ??? MCH 29.1  26.0 - 34.0 (PG)   ??? MCHC 33.2  30.0 - 36.5 (g/dL)   ??? RDW 16.6 (*) 11.5 - 14.5 (%)   ??? PLATELET 148 (*) 150 - 400 (K/uL)   METABOLIC PANEL, BASIC    Collection Time    10/05/09  5:05 AM   Component Value Range   ??? Sodium 139  136 - 145 (MMOL/L)   ??? Potassium 3.7  3.5 - 5.1 (MMOL/L)   ??? Chloride 103  97 - 108 (MMOL/L)   ??? CO2 28  21 - 32 (MMOL/L)   ??? Anion gap 8  5 - 15 (mmol/L)   ??? Glucose 122 (*) 65 - 100 (MG/DL)   ??? BUN 20  6 - 20 (MG/DL)   ??? Creatinine 1.3  0.6 - 1.3 (MG/DL)   ??? BUN/Creatinine ratio 15  12 - 20 ( )   ??? GFR est AA >60  >60 (ml/min/1.54m2)   ??? GFR est non-AA 58 (*) >60 (ml/min/1.31m2)   ??? Calcium 8.3 (*) 8.5 - 10.1 (MG/DL)   MAGNESIUM    Collection Time    10/05/09  5:05 AM   Component Value Range    ??? Magnesium 1.6  1.6 - 2.4 (MG/DL)   PHOSPHORUS    Collection Time    10/05/09  5:05 AM   Component Value Range   ??? Phosphorus 2.8  2.5 - 4.9 (MG/DL)   PROTHROMBIN TIME    Collection Time    10/05/09  5:05 AM   Component Value Range   ??? INR 1.1  0.9 - 1.1 ( )   ??? Prothrombin Time-PT 11.2 (*) 9.0 - 11.0 (SECS)   GLUCOSE, POC    Collection Time    10/05/09  8:01  AM   Component Value Range   ??? POC GLUCOSE 235 (*) 75 - 110 (mg/dL)   VITAMIN Z61    Collection Time    10/05/09  9:27 AM   Component Value Range   ??? Vitamin B12 2492 (*) 254 - 1320 (pg/mL)   FOLATE    Collection Time    10/05/09  9:27 AM   Component Value Range   ??? Folate 17.7  5.0 - 21.0 (ng/mL)   GLUCOSE, POC    Collection Time    10/05/09 11:24 AM   Component Value Range   ??? POC GLUCOSE 299 (*) 75 - 110 (mg/dL)       Assessment and Plan:   1) s/p NSTEMI   - he's had recurrent NSTEMI's the past few months. He has a chronic LCX lesion which has not been amenable to PCI (noted the past 3 caths), which I suspect is the culprit territory. After reviewing the films again with interventional cardiology (Dr. Elmon Kirschner), Mr. Stefan territory is not amenable to PCI and it is felt that attempting to revascularize the LCX territory would have more risk than benefit. He has been free of chest pain complaints past several days. Will continue medical management.  I increased his beta-blocker today.    2) Chronic edema - Restarted Bumex    3) HTN - Norvasc restarted     4) PVD - will need coumadin for about 3 months     Tharon Aquas, MD, [facility]

## 2009-10-05 NOTE — Progress Notes (Signed)
[facility] Pharmacy Dosing Services: 10/05/09    Consult for Warfarin Dosing by Pharmacy by Dr. Selena Batten provided for this 69 y.o. year old male for the indication of PVD.    Order entered for Warfarin 5 mg at 1800 to be given today. Dose to achieve INR goal of  2-3    Significant drug interactions: Levothyroxine  Previous dose given Warfarin 5 mg on 10/04/09   PT/INR Lab Results   Component Value Date/Time    INR 1.1 10/05/2009  5:05 AM        Platelets Lab Results   Component Value Date/Time    PLATELET 148 10/05/2009  5:05 AM        H/H Lab Results   Component Value Date/Time    HGB 8.4 10/05/2009  5:05 AM          Pharmacy to follow daily and will provide subsequent Warfarin dosing based on clinical status.  Charolette Forward, PharmD     Contact information [phone]

## 2009-10-05 NOTE — Progress Notes (Signed)
PTT checked with Denetia RN. Ptt is therapeutic. No change in rate. No bolus. See Heparin Flowsheet.

## 2009-10-05 NOTE — Discharge Summary (Signed)
Physician Discharge Summary     Patient ID:  KINSER FELLMAN  [id number]  69 y.o.  1940/07/06    Admit date: 09/29/2009    Discharge date and time:    Admission Diagnoses: DKA (DIABETIC KETOACIDOSIS)    Discharge Diagnoses:  *DKA (diabetic ketoacidosis) (09/29/2009)    Coronary atherosclerosis of native coronary artery (09/29/2009)    Acute kidney failure, unspecified (09/29/2009)    Chronic kidney disease, stage III (moderate) (09/29/2009)    Anemia, unspecified (09/29/2009)    Back pain (09/29/2009)    Acute myocardial infarction, unspecified site, initial episode of care (09/30/2009)    Diabetes (10/05/2009)     Hospital Course:  *DKA (diabetic ketoacidosis) / Diabetes:  Mr. Hairfield was admitted to the ICU for insulin drip therapy.  With improvement in the DKA, insulin was restarted.  With lantus of 30 units daily, patient developed hypoglycemia, so has been decreased.  Mr. Kaspar continues with premeal aspart insulin along with SSI as well.  Mr. Shughart has been followed by D[facility] as well.    Coronary atherosclerosis of native coronary artery (09/29/2009):  Mr. Mcfann did have an elevation in his troponin consistent with NSTEMI.  Patient seen by cardiology and plan is for medical management without repeat catheterization.  Heparin drip was started and is continuing until INR therapeutic.  ASA and plavix has been continued.  Metoprolol, lisinopril, bumex was continued.  Due to elevation of blood pressure, Norvasc has also been restarted.    Chronic kidney disease, stage III (moderate) (09/29/2009):  Initially had ARF.  With hydration, creatinine has improved to baseline.    Anemia, unspecified (09/29/2009):  With slow trend down of hemoglobin, Mr. Peatross was transfused 1 unit of prbc.  GI was consulted and upper and lower endoscopy was performed.  Superficial ulcers were found.  GI has recommended continued use of PPI with H2B therapy.  Hemoglobin has remained stable despite anticoagulation.     PVD:  Given the recent vascular procedures, Dr. Carmell Austria has recommended continuing heparin gtt until INR is therapeutic with coumadin.  Dorsalis pedis pulses have been present by doppler.      Consults: cardiology and GI    Disposition: home

## 2009-10-05 NOTE — Progress Notes (Signed)
Bedside and Verbal shift change report given to Denetria RN  (oncoming nurse) by Fabian Sharp RN (offgoing nurse).  Report given with SBAR, Kardex, MAR and Recent Results.

## 2009-10-05 NOTE — Consults (Signed)
Name: Travis [name], BAHL Admitted: 09/29/2009  MR #: [id number] DOB: Mar 10, 1941  Account #: [id number] Age: 69  Consultant: Ross Marcus, MD Location: [phone]     CONSULTATION REPORT    DATE OF CONSULTATION: 10/05/2009      CONSULTANT: Davy Pique, MD    CONSULTED BY: Suzan Garibaldi, MD    REASON FOR CONSULTATION: Advice on anticoagulation management.    HISTORY: Patient with severe peripheral vascular disease, known to me from  most recent right leg arteriogram and angioplasty in April for failing  graft. He was noted to have a vein graft from the right proximal SFA to the  above-knee popliteal with single-vessel very diseased peroneal artery  outflow. Was placed on anticoagulation. He was admitted for an MI with  unstable angina. He was admitted with complaints of chest pain and elevated  troponin, which was felt to be noncardiac. He was noted to have DKA, for  which he was cared for in the ICU. His troponin elevations were felt to be  due to DKA and renal insufficiency. His baseline creatinine is between 1.5  to 2.5. He was evaluated by Dr. Monika Salk for anemia. He was evaluated by Dr.  Lucia Estelle for chronic kidney disease with exacerbation of his kidney  function.    Regarding his PVD, he has been walking fine since the angioplasty of  08/08/2009. He has undergone uneventful polypectomy without bleeding from  his transverse colon. EGD findings were a hiatal hernia with mild erythema  and normal esophagus, moderate duodenitis. No active bleeding.    PAST MEDICAL HISTORY: Coronary artery disease, hypertension, PVD,  diabetes, hyperlipidemia, sleep apnea, COPD, chronic kidney disease, CHF,  anemia, pulmonary artery hypertension.    PAST SURGICAL HISTORY: Right knee replacement. Right above-knee popliteal  bypass. Pacemaker. Percutaneous coronary intervention.    SOCIAL HISTORY: Former smoker. Denies alcohol.    FAMILY HISTORY: Positive for hypertension.     REVIEW OF SYSTEMS: As per admission HPI. Currently feels much better.    CURRENT MEDICATIONS  1. Amlodipine.  2. Aspirin.  3. Dulcolax.  4. Bumex.  5. Plavix.  6. Erythropoietin.  7. Colace.  8. Pepcid.  9. Glucagon p.r.n.  10. Heparin drip.  11. Coumadin per INR.  12. Insulin.  13. Levothyroxine.  14. Lisinopril.  15. Metoprolol.  16. Morphine.  17. Nitroglycerin.  18. Oxycodone.  19. Pantoprazole.  20. Crestor.  21. Hytrin.    PHYSICAL EXAMINATION  GENERAL: Sitting up in his chair, NAD.  HEENT: No carotid bruit. Facial features are symmetric.  CHEST: Clear. Normal breath sounds and air entry. A 2/6 systolic ejection  murmur, left upper sternal border. No rubs or gallops.  ABDOMEN: Soft, nondistended. Normoactive bowel sounds.  LYMPHATIC: No lymphadenopathy.  EXTREMITIES: Feet are warm. The right leg is slightly larger than left,  which is a chronic finding. He has faintly palpable dorsalis pedis. Feet  are warm. Trace pitting edema bilaterally, as stated.    REVIEW OF DATA: Currently INR is subtherapeutic. PTT is therapeutic on  heparin drip.    ASSESSMENT: Advanced peripheral vascular disease, status post angioplasty  of inflow and proximal graft, single-vessel outflow, which is tiny and  highly diseased. He is at high risk for limb loss.    RECOMMENDATION: Aggressive anticoagulation combined with antiplatelet  therapy overlapping heparin while subtherapeutic Coumadin effect is  observed.    Thank you for this consult.        Reviewed on 10/06/2009 7:32 AM  Ross Marcus, MD    cc: Suzan Garibaldi, MD   Ross Marcus, MD        CDS/wmx; D: 10/05/2009 9:47 P; T: 10/05/2009 11:23 P; DOC# [id number]; Job#  [id number]

## 2009-10-05 NOTE — Progress Notes (Signed)
Bedside and Verbal shift change report given to Tracey Moore RN (oncoming nurse) by Denetria Williams RN (offgoing nurse).  Report given with SBAR, Kardex, MAR and Recent Results.

## 2009-10-06 LAB — GLUCOSE, POC
Glucose (POC): 91 mg/dL (ref 75–110)
Glucose (POC): 461 mg/dL — ABNORMAL HIGH (ref 75–110)
Glucose (POC): 258 mg/dL — ABNORMAL HIGH (ref 75–110)
Glucose (POC): 79 mg/dL (ref 75–110)
Glucose (POC): 96 mg/dL (ref 75–110)

## 2009-10-06 LAB — CBC W/O DIFF
HCT: 23.9 % — ABNORMAL LOW (ref 36.6–50.3)
HGB: 8 g/dL — ABNORMAL LOW (ref 12.1–17.0)
MCH: 29.1 PG (ref 26.0–34.0)
MCHC: 33.5 g/dL (ref 30.0–36.5)
MCV: 86.9 FL (ref 80.0–99.0)
PLATELET: 178 10*3/uL (ref 150–400)
RBC: 2.75 M/uL — ABNORMAL LOW (ref 4.10–5.70)
RDW: 16.8 % — ABNORMAL HIGH (ref 11.5–14.5)
WBC: 5.6 10*3/uL (ref 4.1–11.1)

## 2009-10-06 LAB — PTT
aPTT: 54.9 s — ABNORMAL HIGH (ref 24.0–33.0)
aPTT: 55.8 s — ABNORMAL HIGH (ref 24.0–33.0)

## 2009-10-06 LAB — PROTHROMBIN TIME + INR
INR: 1.1 (ref 0.9–1.1)
Prothrombin time: 11.1 SECS — ABNORMAL HIGH (ref 9.0–11.0)

## 2009-10-06 LAB — H PYLORI AB, IGG/IGA
H. pylori Ab, IgG: 6.7 EV — ABNORMAL HIGH (ref <=1.7)
H.Pylori Ab,IgA: 4.2 EV — ABNORMAL HIGH (ref <=1.7)

## 2009-10-06 MED ORDER — INSULIN ASPART 100 UNIT/ML INJECTION
100 unit/mL | Freq: Once | SUBCUTANEOUS | Status: AC
Start: 2009-10-06 — End: 2009-10-06
  Administered 2009-10-06: 13:00:00 via SUBCUTANEOUS

## 2009-10-06 MED ORDER — WARFARIN 1 MG TAB
1 mg | Freq: Every evening | ORAL | Status: AC
Start: 2009-10-06 — End: 2009-10-06
  Administered 2009-10-06: 22:00:00 via ORAL

## 2009-10-06 MED FILL — INSULIN ASPART 100 UNIT/ML INJECTION: 100 unit/mL | SUBCUTANEOUS | Qty: 1

## 2009-10-06 MED FILL — COUMADIN 5 MG TABLET: 5 mg | ORAL | Qty: 1

## 2009-10-06 MED FILL — CRESTOR 10 MG TABLET: 10 mg | ORAL | Qty: 2

## 2009-10-06 MED FILL — TERAZOSIN 5 MG CAP: 5 mg | ORAL | Qty: 2

## 2009-10-06 MED FILL — DOK 100 MG CAPSULE: 100 mg | ORAL | Qty: 1

## 2009-10-06 MED FILL — LISINOPRIL 5 MG TAB: 5 mg | ORAL | Qty: 2

## 2009-10-06 MED FILL — LEVOTHYROXINE 150 MCG TAB: 150 mcg | ORAL | Qty: 1

## 2009-10-06 MED FILL — FISH OIL 340 MG-1,000 MG CAPSULE: 340-1000 mg | ORAL | Qty: 2

## 2009-10-06 MED FILL — ASPIRIN 81 MG CHEWABLE TAB: 81 mg | ORAL | Qty: 2

## 2009-10-06 MED FILL — BUMETANIDE 1 MG TAB: 1 mg | ORAL | Qty: 1

## 2009-10-06 MED FILL — INSULIN GLARGINE 100 UNIT/ML INJECTION: 100 unit/mL | SUBCUTANEOUS | Qty: 0.2

## 2009-10-06 MED FILL — METOPROLOL TARTRATE 50 MG TAB: 50 mg | ORAL | Qty: 2

## 2009-10-06 MED FILL — AMLODIPINE 5 MG TAB: 5 mg | ORAL | Qty: 1

## 2009-10-06 MED FILL — PLAVIX 75 MG TABLET: 75 mg | ORAL | Qty: 1

## 2009-10-06 MED FILL — FAMOTIDINE 20 MG TAB: 20 mg | ORAL | Qty: 1

## 2009-10-06 NOTE — Progress Notes (Signed)
[facility]  [address] [place], [place] [address]  [phone]      Medical Progress Note      NAME: Travis [name]   DOB:  08-27-1940  MRM:  [id number]    Date/Time: 10/06/2009  8:08 AM         Subjective:     Chief Complaint:  Chest pain: no further since admission    ROS:  (bold if positive, if negative)                     Tolerating Diet          Objective:       Vitals:          Last 24hrs VS reviewed since prior progress note. Most recent are:    Visit Vitals   Item Reading   ??? BP 135/69   ??? Pulse 111   ??? Temp 99.5 ??F (37.5 ??C)   ??? Resp 18   ??? Ht 5\' 6"    ??? Wt 166 lb   ??? BMI 26.79 kg/m2   ??? SpO2 97%         SpO2 Readings from Last 6 Encounters:   10/06/09 97%   10/06/09 97%   03/31/09 94%        O2 Flow Rate (L/min): 2 l/min     Intake/Output Summary (Last 24 hours) at 10/06/09 1324  Last data filed at 10/05/09 1200   Gross per 24 hour   Intake      0 ml   Output    700 ml   Net   -700 ml            Exam:     Physical Exam:    Gen:  Well-developed, well-nourished, in no acute distress  HEENT:  Pink conjunctivae, PERRL, hearing intact to voice, moist mucous membranes  Neck:  Supple, without masses, thyroid non-tender  Resp:  No accessory muscle use, clear breath sounds without wheezes rales or rhonchi  Card:  No murmurs, normal S1, S2 without thrills, bruits; trace peripheral edema bilaterally  Abd:  Soft, non-tender, non-distended, normoactive bowel sounds are present, no palpable organomegaly and no detectable hernias  Lymph:  No cervical or inguinal adenopathy  Musc:  No cyanosis or clubbing  Skin:  No rashes or ulcers, skin turgor is good  Neuro:  Cranial nerves are grossly intact, no focal motor weakness, follows commands appropriately  Psych:  Good insight, oriented to person, place and time, alert       Telemetry reviewed:   paced    Medications Reviewed: (see below)    Lab Data Reviewed: (see below)     ______________________________________________________________________    Medications:     Current facility-administered medications   Medication Dose Route Frequency   ??? magnesium sulfate 1 g/100 ml IVPB   1 g IntraVENous Q1H   ??? metoprolol (LOPRESSOR) tablet 100 mg  100 mg Oral BID   ??? amlodipine (NORVASC) tablet 5 mg  5 mg Oral DAILY   ??? warfarin (COUMADIN) tablet 5 mg  5 mg Oral ONCE   ??? bumetanide (BUMEX) tablet 1 mg  1 mg Oral BID   ??? lisinopril (PRINIVIL, ZESTRIL) tablet 10 mg  10 mg Oral DAILY   ??? Warfarin: Dosing per Pharmacy   Other PRN   ??? insulin glargine (LANTUS) injection 20 Units  20 Units SubCUTAneous DAILY   ??? insulin aspart (NOVOLOG) 100 unit/mL injection  6 Units  6 Units SubCUTAneous TIDAC   ??? pantoprazole (PROTONIX) tablet 40 mg  40 mg Oral ACD   ??? epoetin alfa (EPOGEN;PROCRIT) injection 10,000 Units  10,000 Units SubCUTAneous Q7D   ??? aspirin chewable tablet 162 mg  162 mg Oral DAILY   ??? heparin 25,000 units in D5W 250 ml infusion  12-25 Units/kg/hr IntraVENous TITRATE   ??? heparin (porcine) injection 4,000 Units  4,000 Units IntraVENous PRN   ??? heparin (porcine) injection 2,000 Units  2,000 Units IntraVENous PRN   ??? dextrose (D50W) injection Syrg 25 g  25 g IntraVENous PRN   ??? insulin aspart (NOVOLOG)   SubCUTAneous AC&HS   ??? clopidogrel (PLAVIX) tablet 75 mg  75 mg Oral DAILY   ??? levothyroxine (SYNTHROID) tablet 150 mcg  150 mcg Oral ACB   ??? nitroglycerin (NITROSTAT) tablet 0.4 mg  0.4 mg SubLINGual Q5MIN PRN   ??? fish oil-omega-3 fatty acids 340-1,000 mg capsule 2 Cap  2 Cap Oral BID   ??? rosuvastatin (CRESTOR) tablet 20 mg  20 mg Oral DAILY   ??? terazosin (HYTRIN) capsule 10 mg  10 mg Oral QHS   ??? docusate sodium (COLACE) capsule 100 mg  100 mg Oral BID   ??? bisacodyl (DULCOLAX) tablet 5 mg  5 mg Oral DAILY PRN   ??? bisacodyl (DULCOLAX) suppository 10 mg  10 mg Rectal DAILY PRN   ??? oxycodone-acetaminophen (PERCOCET) 5-325 mg per tablet 2 Tab  2 Tab Oral Q6H PRN    ??? morphine injection 2 mg  2 mg IntraVENous Q4H PRN   ??? glucose chewable tablet 12 g  12 g Oral PRN   ??? glucagon (GLUCAGEN) injection 1 mg  1 mg IntraMUSCular PRN   ??? famotidine (PEPCID) tablet 20 mg  20 mg Oral DAILY                Lab Review:     Recent Labs   Basename 10/06/09 0432 10/05/09 0505 10/04/09 0504   ??? WBC 5.6 6.4 7.4   ??? HGB 8.0* 8.4* 8.7*   ??? HCT 23.9* 25.3* 25.8*   ??? PLT 178 148* 138*         Recent Labs   Basename 10/05/09 1800 10/05/09 0505 10/04/09 0504   ??? NA -- 139 136   ??? K -- 3.7 4.1   ??? CL -- 103 103   ??? CO2 -- 28 26   ??? GLU -- 122* 221*   ??? BUN -- 20 20   ??? CREA -- 1.3 1.3   ??? CA -- 8.3* 8.3*   ??? MG 1.8 1.6 1.8   ??? PHOS -- 2.8 2.1*   ??? ALB -- -- --   ??? TBIL -- -- --   ??? SGOT -- -- --   ??? INR -- 1.1 1.1         Lab Results   Component Value Date/Time    POC GLUCOSE 461 10/06/2009  7:55 AM    POC GLUCOSE 91 10/05/2009  8:51 PM    POC GLUCOSE 196 10/05/2009  3:49 PM    POC GLUCOSE 299 10/05/2009 11:24 AM    POC GLUCOSE 235 10/05/2009  8:01 AM           No results found for this basename: PH:3,PCO2:3,PO2:3,HCO3:3,FIO2:3 in the last 72 hours  Recent Labs   Basename 10/05/09 0505 10/04/09 0504   ??? INR 1.1 1.1  Assessment:     Patient Active Hospital Problem List:  Coronary atherosclerosis of native coronary artery (09/29/2009)    Chronic kidney disease, stage III (moderate) (09/29/2009)    Anemia, unspecified (09/29/2009)    Back pain (09/29/2009)    Acute myocardial infarction, unspecified site, initial episode of care (09/30/2009)    Diabetes (10/05/2009)    PVD (peripheral vascular disease) (10/06/2009)    Hyperlipidemia (10/06/2009)         Plan:     Patient Active Hospital Problem List:  Coronary atherosclerosis of native coronary artery (09/29/2009)   - per Cards no plans for further cath   - continue medical management    Chronic kidney disease, stage III (moderate) (09/29/2009)   - renal function at baseline, follow    Anemia, unspecified (09/29/2009)   - Hgb stable, follow    Back pain (09/29/2009)     Acute myocardial infarction, unspecified site, initial episode of care (09/30/2009)   - as above    Diabetes (10/05/2009)   - blood sugars all over the place, low last night and then no BS charted for 12 hours!   - follow on current regimen for another 24 hours to get more data    PVD (peripheral vascular disease) (10/06/2009)   - per Dr. Carmell Austria continue IV heparin until INR is therapeutic   - warfarin restarted    Hyperlipidemia (10/06/2009)   - continue lipid lowering medications      Total time spent with patient: 35 minutes                  Care Plan discussed with: Patient and Nursing Staff    Discussed:  Care Plan and D/C Planning    Prophylaxis:  Coumadin and IV heparin    Disposition:  Home w/Family           ___________________________________________________    Attending Physician: Marijo File, MD

## 2009-10-06 NOTE — Progress Notes (Signed)
Bedside and Verbal shift change report given to Juluis Pitch (oncoming nurse) by Claris Pong RN (offgoing nurse).  Report given with SBAR, Kardex, Intake/Output and Recent Results.

## 2009-10-06 NOTE — Progress Notes (Addendum)
S: Feels fine today   O:Physical Exam:   BP 135/69   Pulse 111   Temp 99.5 ??F (37.5 ??C)   Resp 18   Ht 5\' 6"  (1.676 m)   Wt 166 lb (75.297 kg)   BMI 26.79 kg/m2   SpO2 97%   Patient appears generally well, mood and affect are appropriate and pleasant.   HEENT: Normocephalic, atraumatic.   Neck Exam: Supple,   Lung Exam: Clear to auscultation, even breath sounds.   Cardiac Exam: Regular rate and rhythm with 1-2/6 systolic murmur.   Abdomen: Soft, non-tender, non-distended, normal bowel sounds.   Extremities: mild lower extremity edema.   Vasc - poor DP bilat    Recent Results (from the past 24 hour(s))   VITAMIN B12    Collection Time    10/05/09  9:27 AM   Component Value Range   ??? Vitamin B12 2492 (*) 254 - 1320 (pg/mL)   FOLATE    Collection Time    10/05/09  9:27 AM   Component Value Range   ??? Folate 17.7  5.0 - 21.0 (ng/mL)   GLUCOSE, POC    Collection Time    10/05/09 11:24 AM   Component Value Range   ??? POC GLUCOSE 299 (*) 75 - 110 (mg/dL)   GLUCOSE, POC    Collection Time    10/05/09  3:49 PM   Component Value Range   ??? POC GLUCOSE 196 (*) 75 - 110 (mg/dL)   MAGNESIUM    Collection Time    10/05/09  6:00 PM   Component Value Range   ??? Magnesium 1.8  1.6 - 2.4 (MG/DL)   GLUCOSE, POC    Collection Time    10/05/09  8:51 PM   Component Value Range   ??? POC GLUCOSE 91  75 - 110 (mg/dL)   CBC W/O DIFF    Collection Time    10/06/09  4:32 AM   Component Value Range   ??? WBC 5.6  4.1 - 11.1 (K/uL)   ??? RBC 2.75 (*) 4.10 - 5.70 (M/uL)   ??? HGB 8.0 (*) 12.1 - 17.0 (g/dL)   ??? HCT 23.9 (*) 36.6 - 50.3 (%)   ??? MCV 86.9  80.0 - 99.0 (FL)   ??? MCH 29.1  26.0 - 34.0 (PG)   ??? MCHC 33.5  30.0 - 36.5 (g/dL)   ??? RDW 16.8 (*) 11.5 - 14.5 (%)   ??? PLATELET 178  150 - 400 (K/uL)   GLUCOSE, POC    Collection Time    10/06/09  7:55 AM   Component Value Range   ??? POC GLUCOSE 461 (*) 75 - 110 (mg/dL)         Assessment and Plan:   1) s/p NSTEMI    - he's had recurrent NSTEMI's the past few months. He has a chronic LCX lesion which has not been amenable to PCI (noted the past 3 caths), which I suspect is the culprit territory. After reviewing the films again with interventional cardiology (Dr. Elmon Kirschner), Mr. Steck territory is not amenable to PCI and it is felt that attempting to revascularize the LCX territory would have more risk than benefit. He has been free of chest pain complaints past several days. Will continue medical management.     2) Chronic edema - Restarted Bumex     3) HTN - Norvasc restarted; can uptitrate Norvasc if needed    4) PVD - will need coumadin for about 3 months; heparin  bridging until INR therapeutic    5) I'll see him back Monday - please call if any questions over the weekend.    Tharon Aquas, MD, [facility]

## 2009-10-06 NOTE — Progress Notes (Signed)
[facility] Pharmacy Dosing Services: 10/06/09    Consult for Warfarin Dosing by Pharmacy by Dr. Selena Batten provided for this 69 y.o. year old male for the indication of PVD.    Order entered for Warfarin 6 mg at 1800 to be given today. Dose to achieve INR goal of  2-3.    Significant drug interactions: Levothyroxine  Previous dose given Warfarin 5 mg po at 1707 on 10/05/09   PT/INR Lab Results   Component Value Date/Time    INR 1.1 10/06/2009  8:50 AM        Platelets Lab Results   Component Value Date/Time    PLATELET 178 10/06/2009  4:32 AM        H/H Lab Results   Component Value Date/Time    HGB 8.0 10/06/2009  4:32 AM          Pharmacy to follow daily and will provide subsequent Warfarin dosing based on clinical status.  JENNY R BESHERE)  Contact information [phone]

## 2009-10-06 NOTE — Progress Notes (Signed)
Bedside and Verbal shift change report given to Clara RN (oncoming nurse) by Fabian Sharp RN (offgoing nurse).  Report given with SBAR, Kardex, ED Summary, Intake/Output and Recent Results.     No PTT drawn this am, stat order placed. Lab notified patient a hard stick, and lab to come draw. New tech to come in 0800.

## 2009-10-06 NOTE — Progress Notes (Signed)
[facility] Progress Note    Assessment:     1.ZOX:WRUEAVWU.  2.CKD:2 to DM,HTN:Baseline cr 1.5-2.0 mg%     -cr at baseline  3.Hypotension:better   4.Proteinuria:2 to DM,on lisinopeil  5.Anemia:ok Iron panel    6.DM     Plan:     1.HTN,DM control  2.Epo out  patient  3.no need for Iron      Subjective:     HPI: Patient seen.    Patient denies any Chest pain/palpitations.    Patient denies any shortness of breath,cough,phlem.      Current facility-administered medications   Medication Dose Route Frequency   ??? insulin aspart (NOVOLOG) 100 unit/mL injection 15 Units  15 Units SubCUTAneous ONCE   ??? magnesium sulfate 1 g/100 ml IVPB   1 g IntraVENous Q1H   ??? metoprolol (LOPRESSOR) tablet 100 mg  100 mg Oral BID   ??? amlodipine (NORVASC) tablet 5 mg  5 mg Oral DAILY   ??? warfarin (COUMADIN) tablet 5 mg  5 mg Oral ONCE   ??? bumetanide (BUMEX) tablet 1 mg  1 mg Oral BID   ??? lisinopril (PRINIVIL, ZESTRIL) tablet 10 mg  10 mg Oral DAILY   ??? Warfarin: Dosing per Pharmacy   Other PRN   ??? insulin glargine (LANTUS) injection 20 Units  20 Units SubCUTAneous DAILY   ??? insulin aspart (NOVOLOG) 100 unit/mL injection 6 Units  6 Units SubCUTAneous TIDAC   ??? pantoprazole (PROTONIX) tablet 40 mg  40 mg Oral ACD   ??? epoetin alfa (EPOGEN;PROCRIT) injection 10,000 Units  10,000 Units SubCUTAneous Q7D   ??? aspirin chewable tablet 162 mg  162 mg Oral DAILY   ??? heparin 25,000 units in D5W 250 ml infusion  12-25 Units/kg/hr IntraVENous TITRATE   ??? heparin (porcine) injection 4,000 Units  4,000 Units IntraVENous PRN   ??? heparin (porcine) injection 2,000 Units  2,000 Units IntraVENous PRN   ??? dextrose (D50W) injection Syrg 25 g  25 g IntraVENous PRN   ??? insulin aspart (NOVOLOG)   SubCUTAneous AC&HS   ??? clopidogrel (PLAVIX) tablet 75 mg  75 mg Oral DAILY   ??? levothyroxine (SYNTHROID) tablet 150 mcg  150 mcg Oral ACB   ??? nitroglycerin (NITROSTAT) tablet 0.4 mg  0.4 mg SubLINGual Q5MIN PRN    ??? fish oil-omega-3 fatty acids 340-1,000 mg capsule 2 Cap  2 Cap Oral BID   ??? rosuvastatin (CRESTOR) tablet 20 mg  20 mg Oral DAILY   ??? terazosin (HYTRIN) capsule 10 mg  10 mg Oral QHS   ??? docusate sodium (COLACE) capsule 100 mg  100 mg Oral BID   ??? bisacodyl (DULCOLAX) tablet 5 mg  5 mg Oral DAILY PRN   ??? bisacodyl (DULCOLAX) suppository 10 mg  10 mg Rectal DAILY PRN   ??? oxycodone-acetaminophen (PERCOCET) 5-325 mg per tablet 2 Tab  2 Tab Oral Q6H PRN   ??? morphine injection 2 mg  2 mg IntraVENous Q4H PRN   ??? glucose chewable tablet 12 g  12 g Oral PRN   ??? glucagon (GLUCAGEN) injection 1 mg  1 mg IntraMUSCular PRN   ??? famotidine (PEPCID) tablet 20 mg  20 mg Oral DAILY            Review of Systems  Pertinent items are noted in HPI.    Objective:     Vitals:  Blood pressure 135/69, pulse 111, temperature 99.5 ??F (37.5 ??C), resp. rate 18, height 5\' 6"  (1.676 m), weight 166 lb (  75.297 kg), SpO2 97.00%.  Temp (24hrs), Avg:99 ??F (37.2 ??C), Min:98.6 ??F (37 ??C), Max:99.5 ??F (37.5 ??C)      Intake and Output:     In: -   Out: 1000 [Urine:1000]    Physical Exam:                Patient is not currently intubated    GEN: NAD   RS: CTA, negative for crackles   CVS: S1S2  ABD: Soft, Non Tender  GU:  No Foley   EXT: No Edema  SKIN: No Rash   NEURO: Grossly focal      ECG: unchanged from previous tracings     Data Review        No results found for this basename: ITNL in the last 72 hours     No results found for this basename: CPK:3,CKMB:3,TROIQ:3, in the last 72 hours    Recent Labs   Basename 10/06/09 0432 10/05/09 1800 10/05/09 0505 10/04/09 0504   ??? NA -- -- 139 136   ??? K -- -- 3.7 4.1   ??? CL -- -- 103 103   ??? CO2 -- -- 28 26   ??? BUN -- -- 20 20   ??? CREA -- -- 1.3 1.3   ??? GLU -- -- 122* 221*   ??? PHOS -- -- 2.8 2.1*   ??? MG -- 1.8 1.6 1.8   ??? ALB -- -- -- --   ??? WBC 5.6 -- 6.4 7.4   ??? HGB 8.0* -- 8.4* 8.7*   ??? HCT 23.9* -- 25.3* 25.8*   ??? PLT 178 -- 148* 138*         Recent Labs   Basename 10/05/09 0505 10/04/09 0504   ??? INR 1.1 1.1    ??? PTP 11.2* 11.1*   ??? APTT -- --       Needs: urine analysis, urine sodium, protein and creatinine  Lab Results   Component Value Date/Time    Creatinine,urine random 33.7 05/31/2009  7:20 PM             Discussed with:    Patient

## 2009-10-07 LAB — GLUCOSE, POC
Glucose (POC): 309 mg/dL — ABNORMAL HIGH (ref 75–110)
Glucose (POC): 270 mg/dL — ABNORMAL HIGH (ref 75–110)
Glucose (POC): 267 mg/dL — ABNORMAL HIGH (ref 75–110)
Glucose (POC): 67 mg/dL — ABNORMAL LOW (ref 75–110)
Glucose (POC): 67 mg/dL — ABNORMAL LOW (ref 75–110)
Glucose (POC): 156 mg/dL — ABNORMAL HIGH (ref 75–110)

## 2009-10-07 LAB — PROTHROMBIN TIME + INR
INR: 1.2 — ABNORMAL HIGH (ref 0.9–1.1)
Prothrombin time: 11.5 SECS — ABNORMAL HIGH (ref 9.0–11.0)

## 2009-10-07 LAB — PTT
aPTT: 66.8 s — ABNORMAL HIGH (ref 24.0–33.0)
aPTT: 76.5 s — ABNORMAL HIGH (ref 24.0–33.0)
aPTT: 79.4 s — ABNORMAL HIGH (ref 24.0–33.0)

## 2009-10-07 MED ORDER — WARFARIN 2.5 MG TAB
2.5 mg | Freq: Every evening | ORAL | Status: AC
Start: 2009-10-07 — End: 2009-10-07
  Administered 2009-10-07: 22:00:00 via ORAL

## 2009-10-07 MED FILL — PLAVIX 75 MG TABLET: 75 mg | ORAL | Qty: 1

## 2009-10-07 MED FILL — METOPROLOL TARTRATE 50 MG TAB: 50 mg | ORAL | Qty: 2

## 2009-10-07 MED FILL — INSULIN ASPART 100 UNIT/ML INJECTION: 100 unit/mL | SUBCUTANEOUS | Qty: 1

## 2009-10-07 MED FILL — CRESTOR 10 MG TABLET: 10 mg | ORAL | Qty: 2

## 2009-10-07 MED FILL — TERAZOSIN 5 MG CAP: 5 mg | ORAL | Qty: 2

## 2009-10-07 MED FILL — INSULIN GLARGINE 100 UNIT/ML INJECTION: 100 unit/mL | SUBCUTANEOUS | Qty: 0.2

## 2009-10-07 MED FILL — BISACODYL 5 MG TAB, DELAYED RELEASE: 5 mg | ORAL | Qty: 1

## 2009-10-07 MED FILL — PANTOPRAZOLE 40 MG TAB, DELAYED RELEASE: 40 mg | ORAL | Qty: 1

## 2009-10-07 MED FILL — BUMETANIDE 1 MG TAB: 1 mg | ORAL | Qty: 1

## 2009-10-07 MED FILL — FAMOTIDINE 20 MG TAB: 20 mg | ORAL | Qty: 1

## 2009-10-07 MED FILL — FISH OIL 340 MG-1,000 MG CAPSULE: 340-1000 mg | ORAL | Qty: 2

## 2009-10-07 MED FILL — DOK 100 MG CAPSULE: 100 mg | ORAL | Qty: 1

## 2009-10-07 MED FILL — HEPARIN (PORCINE) IN D5W 25,000 UNIT/250 ML IV: 25000 unit/250 mL(100 unit/mL) | INTRAVENOUS | Qty: 250

## 2009-10-07 MED FILL — AMLODIPINE 5 MG TAB: 5 mg | ORAL | Qty: 1

## 2009-10-07 MED FILL — LEVOTHYROXINE 150 MCG TAB: 150 mcg | ORAL | Qty: 1

## 2009-10-07 MED FILL — LISINOPRIL 5 MG TAB: 5 mg | ORAL | Qty: 2

## 2009-10-07 MED FILL — COUMADIN 5 MG TABLET: 5 mg | ORAL | Qty: 1

## 2009-10-07 MED FILL — ASPIRIN 81 MG CHEWABLE TAB: 81 mg | ORAL | Qty: 2

## 2009-10-07 NOTE — Progress Notes (Signed)
[facility] Pharmacy Dosing Services: 10/07/2009    Consult for Warfarin Dosing by Pharmacy by Dr. Selena Batten  Consult provided for this 69 y.o. year old male , for indication of PVD.    Per protocol, will increase dose by 0-25% today since INR <1.5. Order entered for Warfarin 7.5 mg at 1800 to be given today.   Dose to achieve INR goal of  2-3    Significant drug interactions: levothyroxine,aspirin chronic medications  Previous dose given 10/06/2009 6 mg  05/11 and 10/05/2009 5 mg   PT/INR Lab Results   Component Value Date/Time    INR 1.2 10/07/2009  7:50 AM        Platelets Lab Results   Component Value Date/Time    PLATELET 178 10/06/2009  4:32 AM        H/H Lab Results   Component Value Date/Time    HGB 8.0 10/06/2009  4:32 AM          Pharmacy to follow daily and will provide subsequent Warfarin dosing based on clinical status.  CARYN A DEFRAIA)  Contact information [phone]

## 2009-10-07 NOTE — Progress Notes (Signed)
Bedside and Verbal shift change report given to Juluis Pitch RN (oncoming nurse) by Hassan Rowan RN (offgoing nurse).  Report given with SBAR, Kardex, MAR and Recent Results.

## 2009-10-07 NOTE — Progress Notes (Signed)
Cardiopulmonary Rehab:  Printed CHF teaching packet and MI teaching materials given to patient.  Patient having blood work drawn.  Will follow for teaching.

## 2009-10-07 NOTE — Progress Notes (Signed)
[facility] Progress Note    Assessment:     1.ZOX:WRUEAVWU.  2.CKD:2 to DM,HTN:Baseline cr 1.5-2.0 mg%     -cr at baseline  3.Hypotension:better   4.Proteinuria:2 to DM,on lisinopeil  5.Anemia:ok Iron panel    6.DM     Plan:     1.HTN,DM control  2.Epo out  patient  3.no need for Iron      Subjective:     HPI: Patient seen.    Patient denies any Chest pain/palpitations.    Patient denies any shortness of breath,cough,phlem.      Current facility-administered medications   Medication Dose Route Frequency   ??? warfarin (COUMADIN) tablet 7.5 mg  7.5 mg Oral QPM   ??? warfarin (COUMADIN) tablet 6 mg  6 mg Oral QPM   ??? metoprolol (LOPRESSOR) tablet 100 mg  100 mg Oral BID   ??? amlodipine (NORVASC) tablet 5 mg  5 mg Oral DAILY   ??? bumetanide (BUMEX) tablet 1 mg  1 mg Oral BID   ??? lisinopril (PRINIVIL, ZESTRIL) tablet 10 mg  10 mg Oral DAILY   ??? Warfarin: Dosing per Pharmacy   Other PRN   ??? insulin glargine (LANTUS) injection 20 Units  20 Units SubCUTAneous DAILY   ??? insulin aspart (NOVOLOG) 100 unit/mL injection 6 Units  6 Units SubCUTAneous TIDAC   ??? pantoprazole (PROTONIX) tablet 40 mg  40 mg Oral ACD   ??? epoetin alfa (EPOGEN;PROCRIT) injection 10,000 Units  10,000 Units SubCUTAneous Q7D   ??? aspirin chewable tablet 162 mg  162 mg Oral DAILY   ??? heparin 25,000 units in D5W 250 ml infusion  12-25 Units/kg/hr IntraVENous TITRATE   ??? heparin (porcine) injection 4,000 Units  4,000 Units IntraVENous PRN   ??? heparin (porcine) injection 2,000 Units  2,000 Units IntraVENous PRN   ??? dextrose (D50W) injection Syrg 25 g  25 g IntraVENous PRN   ??? insulin aspart (NOVOLOG)   SubCUTAneous AC&HS   ??? clopidogrel (PLAVIX) tablet 75 mg  75 mg Oral DAILY   ??? levothyroxine (SYNTHROID) tablet 150 mcg  150 mcg Oral ACB   ??? nitroglycerin (NITROSTAT) tablet 0.4 mg  0.4 mg SubLINGual Q5MIN PRN   ??? fish oil-omega-3 fatty acids 340-1,000 mg capsule 2 Cap  2 Cap Oral BID   ??? rosuvastatin (CRESTOR) tablet 20 mg  20 mg Oral DAILY    ??? terazosin (HYTRIN) capsule 10 mg  10 mg Oral QHS   ??? docusate sodium (COLACE) capsule 100 mg  100 mg Oral BID   ??? bisacodyl (DULCOLAX) tablet 5 mg  5 mg Oral DAILY PRN   ??? bisacodyl (DULCOLAX) suppository 10 mg  10 mg Rectal DAILY PRN   ??? oxycodone-acetaminophen (PERCOCET) 5-325 mg per tablet 2 Tab  2 Tab Oral Q6H PRN   ??? morphine injection 2 mg  2 mg IntraVENous Q4H PRN   ??? glucose chewable tablet 12 g  12 g Oral PRN   ??? glucagon (GLUCAGEN) injection 1 mg  1 mg IntraMUSCular PRN   ??? famotidine (PEPCID) tablet 20 mg  20 mg Oral DAILY            Review of Systems  Pertinent items are noted in HPI.    Objective:     Vitals:  Blood pressure 130/58, pulse 83, temperature 98.4 ??F (36.9 ??C), resp. rate 18, height 5\' 6"  (1.676 m), weight 183 lb 8 oz (83.235 kg), SpO2 97.00%.  Temp (24hrs), Avg:98.7 ??F (37.1 ??C), Min:98.4 ??F (36.9 ??C), Max:99.1 ??  F (37.3 ??C)      Intake and Output:     In: 720 [P.O.:720]  Out: 1750 [Urine:1750]    Physical Exam:                Patient is not currently intubated    GEN: NAD   RS: CTA, negative for crackles   CVS: S1S2  ABD: Soft, Non Tender  GU:  No Foley   EXT: No Edema  SKIN: No Rash   NEURO: Grossly focal      ECG: unchanged from previous tracings     Data Review        No results found for this basename: ITNL in the last 72 hours     No results found for this basename: CPK:3,CKMB:3,TROIQ:3, in the last 72 hours    Recent Labs   Basename 10/06/09 0432 10/05/09 1800 10/05/09 0505   ??? NA -- -- 139   ??? K -- -- 3.7   ??? CL -- -- 103   ??? CO2 -- -- 28   ??? BUN -- -- 20   ??? CREA -- -- 1.3   ??? GLU -- -- 122*   ??? PHOS -- -- 2.8   ??? MG -- 1.8 1.6   ??? ALB -- -- --   ??? WBC 5.6 -- 6.4   ??? HGB 8.0* -- 8.4*   ??? HCT 23.9* -- 25.3*   ??? PLT 178 -- 148*         Recent Labs   Basename 10/07/09 0750 10/06/09 0850 10/05/09 0505   ??? INR 1.2* 1.1 1.1   ??? PTP 11.5* 11.1* 11.2*   ??? APTT -- -- --       Needs: urine analysis, urine sodium, protein and creatinine  Lab Results   Component Value Date/Time     Creatinine,urine random 33.7 05/31/2009  7:20 PM             Discussed with:    Patient

## 2009-10-07 NOTE — Progress Notes (Signed)
[facility]  [address] [place], [place] [address]  [phone]      Medical Progress Note      NAME: Travis [name]   DOB:  June 16, 1940  MRM:  [id number]    Date/Time: 10/07/2009  7:55 AM          Subjective:     Chief Complaint:  Chest pain: no further since admission    ROS:  (bold if positive, if negative)                     Tolerating Diet          Objective:       Vitals:          Last 24hrs VS reviewed since prior progress note. Most recent are:    Visit Vitals   Item Reading   ??? BP 156/70   ??? Pulse 91   ??? Temp 98.4 ??F (36.9 ??C)   ??? Resp 18   ??? Ht 5\' 6"    ??? Wt 183 lb 8 oz   ??? BMI 29.62 kg/m2   ??? SpO2 97%         SpO2 Readings from Last 6 Encounters:   10/07/09 97%   10/07/09 97%   03/31/09 94%        O2 Flow Rate (L/min): 2 l/min     Intake/Output Summary (Last 24 hours) at 10/07/09 0755  Last data filed at 10/06/09 2103   Gross per 24 hour   Intake    720 ml   Output   1750 ml   Net  -1030 ml            Exam:     Physical Exam:    Gen:  Well-developed, well-nourished, in no acute distress  HEENT:  Pink conjunctivae, PERRL, hearing intact to voice, moist mucous membranes  Neck:  Supple, without masses, thyroid non-tender  Resp:  No accessory muscle use, clear breath sounds without wheezes rales or rhonchi  Card:  No murmurs, normal S1, S2 without thrills, bruits; trace peripheral edema bilaterally  Abd:  Soft, non-tender, non-distended, normoactive bowel sounds are present, no palpable organomegaly and no detectable hernias  Lymph:  No cervical or inguinal adenopathy  Musc:  No cyanosis or clubbing  Skin:  No rashes or ulcers, skin turgor is good  Neuro:  Cranial nerves are grossly intact, no focal motor weakness, follows commands appropriately  Psych:  Good insight, oriented to person, place and time, alert       Telemetry reviewed:   paced    Medications Reviewed: (see below)    Lab Data Reviewed: (see below)     ______________________________________________________________________    Medications:     Current facility-administered medications   Medication Dose Route Frequency   ??? insulin aspart (NOVOLOG) 100 unit/mL injection 15 Units  15 Units SubCUTAneous ONCE   ??? warfarin (COUMADIN) tablet 6 mg  6 mg Oral QPM   ??? metoprolol (LOPRESSOR) tablet 100 mg  100 mg Oral BID   ??? amlodipine (NORVASC) tablet 5 mg  5 mg Oral DAILY   ??? bumetanide (BUMEX) tablet 1 mg  1 mg Oral BID   ??? lisinopril (PRINIVIL, ZESTRIL) tablet 10 mg  10 mg Oral DAILY   ??? Warfarin: Dosing per Pharmacy   Other PRN   ??? insulin glargine (LANTUS) injection 20 Units  20 Units SubCUTAneous DAILY   ??? insulin aspart (NOVOLOG) 100 unit/mL injection  6 Units  6 Units SubCUTAneous TIDAC   ??? pantoprazole (PROTONIX) tablet 40 mg  40 mg Oral ACD   ??? epoetin alfa (EPOGEN;PROCRIT) injection 10,000 Units  10,000 Units SubCUTAneous Q7D   ??? aspirin chewable tablet 162 mg  162 mg Oral DAILY   ??? heparin 25,000 units in D5W 250 ml infusion  12-25 Units/kg/hr IntraVENous TITRATE   ??? heparin (porcine) injection 4,000 Units  4,000 Units IntraVENous PRN   ??? heparin (porcine) injection 2,000 Units  2,000 Units IntraVENous PRN   ??? dextrose (D50W) injection Syrg 25 g  25 g IntraVENous PRN   ??? insulin aspart (NOVOLOG)   SubCUTAneous AC&HS   ??? clopidogrel (PLAVIX) tablet 75 mg  75 mg Oral DAILY   ??? levothyroxine (SYNTHROID) tablet 150 mcg  150 mcg Oral ACB   ??? nitroglycerin (NITROSTAT) tablet 0.4 mg  0.4 mg SubLINGual Q5MIN PRN   ??? fish oil-omega-3 fatty acids 340-1,000 mg capsule 2 Cap  2 Cap Oral BID   ??? rosuvastatin (CRESTOR) tablet 20 mg  20 mg Oral DAILY   ??? terazosin (HYTRIN) capsule 10 mg  10 mg Oral QHS   ??? docusate sodium (COLACE) capsule 100 mg  100 mg Oral BID   ??? bisacodyl (DULCOLAX) tablet 5 mg  5 mg Oral DAILY PRN   ??? bisacodyl (DULCOLAX) suppository 10 mg  10 mg Rectal DAILY PRN   ??? oxycodone-acetaminophen (PERCOCET) 5-325 mg per tablet 2 Tab  2 Tab Oral Q6H PRN    ??? morphine injection 2 mg  2 mg IntraVENous Q4H PRN   ??? glucose chewable tablet 12 g  12 g Oral PRN   ??? glucagon (GLUCAGEN) injection 1 mg  1 mg IntraMUSCular PRN   ??? famotidine (PEPCID) tablet 20 mg  20 mg Oral DAILY                Lab Review:     Recent Labs   Basename 10/06/09 0432 10/05/09 0505   ??? WBC 5.6 6.4   ??? HGB 8.0* 8.4*   ??? HCT 23.9* 25.3*   ??? PLT 178 148*         Recent Labs   Basename 10/06/09 0850 10/05/09 1800 10/05/09 0505   ??? NA -- -- 139   ??? K -- -- 3.7   ??? CL -- -- 103   ??? CO2 -- -- 28   ??? GLU -- -- 122*   ??? BUN -- -- 20   ??? CREA -- -- 1.3   ??? CA -- -- 8.3*   ??? MG -- 1.8 1.6   ??? PHOS -- -- 2.8   ??? ALB -- -- --   ??? TBIL -- -- --   ??? SGOT -- -- --   ??? INR 1.1 -- 1.1         Lab Results   Component Value Date/Time    POC GLUCOSE 309 10/06/2009  8:17 PM    POC GLUCOSE 96 10/06/2009  4:35 PM    POC GLUCOSE 79 10/06/2009  4:31 PM    POC GLUCOSE 258 10/06/2009 11:53 AM    POC GLUCOSE 461 10/06/2009  7:55 AM           No results found for this basename: PH:3,PCO2:3,PO2:3,HCO3:3,FIO2:3 in the last 72 hours  Recent Labs   Basename 10/06/09 0850 10/05/09 0505   ??? INR 1.1 1.1              Assessment:     Patient  Active Hospital Problem List:  *Coronary atherosclerosis of native coronary artery (09/29/2009)    Chronic kidney disease, stage III (moderate) (09/29/2009)    Anemia, unspecified (09/29/2009)    Back pain (09/29/2009)    Acute myocardial infarction, unspecified site, initial episode of care (09/30/2009)    Diabetes (10/05/2009)    PVD (peripheral vascular disease) (10/06/2009)    Hyperlipidemia (10/06/2009)         Plan:     Patient Active Hospital Problem List:  Coronary atherosclerosis of native coronary artery (09/29/2009)   - per Cards no plans for further cath   - continue medical management    Chronic kidney disease, stage III (moderate) (09/29/2009)   - renal function at baseline, follow    Anemia, unspecified (09/29/2009)   - Hgb slowly trending down, follow    Back pain (09/29/2009)     Acute myocardial infarction, unspecified site, initial episode of care (09/30/2009)   - as above    Diabetes (10/05/2009)   - blood sugars again all over the place   - high blood sugars following overreaction to "low" blood sugars   - difficult to control without patient's cooperation with regimen; patient is overeating in reaction to "low" blood sugars   - continue current regimen and follow    PVD (peripheral vascular disease) (10/06/2009)   - per Dr. Carmell Austria continue IV heparin until INR is therapeutic   - warfarin restarted    Hyperlipidemia (10/06/2009)   - continue lipid lowering medications      Total time spent with patient: 35 minutes                  Care Plan discussed with: Patient and Nursing Staff    Discussed:  Care Plan and D/C Planning    Prophylaxis:  Coumadin and IV heparin    Disposition:  Home w/Family           ___________________________________________________    Attending Physician: Marijo File, MD

## 2009-10-07 NOTE — Progress Notes (Signed)
Standing weight: 183.8 lbs.  Observed above bilateral clavicles, enlarge areas , soft to touch, patient denies pain, no discoloration, warm to touch.

## 2009-10-07 NOTE — Progress Notes (Signed)
Bedside and Verbal shift change report given to [employer] (oncoming nurse) by Daryl Eastern (offgoing nurse).  Report given with SBAR, Kardex, Intake/Output, MAR and Recent Results. Karl Luke, RN

## 2009-10-07 NOTE — Progress Notes (Signed)
PTT 66.8, therapeutic range, no change in heparin rate of 34ml/hr. Next PTT @ 0745 05/09/10.

## 2009-10-08 LAB — METABOLIC PANEL, BASIC
Anion gap: 7 mmol/L (ref 5–15)
BUN/Creatinine ratio: 14 (ref 12–20)
BUN: 23 MG/DL — ABNORMAL HIGH (ref 6–20)
CO2: 31 MMOL/L (ref 21–32)
Calcium: 8.7 MG/DL (ref 8.5–10.1)
Chloride: 98 MMOL/L (ref 97–108)
Creatinine: 1.6 MG/DL — ABNORMAL HIGH (ref 0.6–1.3)
GFR est AA: 55 mL/min/{1.73_m2} — ABNORMAL LOW (ref >60)
GFR est non-AA: 46 mL/min/{1.73_m2} — ABNORMAL LOW (ref >60)
Glucose: 201 MG/DL — ABNORMAL HIGH (ref 65–100)
Potassium: 3.6 MMOL/L (ref 3.5–5.1)
Sodium: 136 MMOL/L (ref 136–145)

## 2009-10-08 LAB — PROTHROMBIN TIME + INR
INR: 1.2 — ABNORMAL HIGH (ref 0.9–1.1)
Prothrombin time: 11.7 SECS — ABNORMAL HIGH (ref 9.0–11.0)

## 2009-10-08 LAB — CBC W/O DIFF
HCT: 25.1 % — ABNORMAL LOW (ref 36.6–50.3)
HGB: 8.4 g/dL — ABNORMAL LOW (ref 12.1–17.0)
MCH: 28.8 PG (ref 26.0–34.0)
MCHC: 33.5 g/dL (ref 30.0–36.5)
MCV: 86 FL (ref 80.0–99.0)
PLATELET: 209 10*3/uL (ref 150–400)
RBC: 2.92 M/uL — ABNORMAL LOW (ref 4.10–5.70)
RDW: 16.6 % — ABNORMAL HIGH (ref 11.5–14.5)
WBC: 5.6 10*3/uL (ref 4.1–11.1)

## 2009-10-08 LAB — PTT
aPTT: 58.8 s — ABNORMAL HIGH (ref 24.0–33.0)
aPTT: 54.5 s — ABNORMAL HIGH (ref 24.0–33.0)
aPTT: 69.5 s — ABNORMAL HIGH (ref 24.0–33.0)

## 2009-10-08 LAB — PHOSPHORUS: Phosphorus: 4.2 MG/DL (ref 2.5–4.9)

## 2009-10-08 LAB — GLUCOSE, POC
Glucose (POC): 215 mg/dL — ABNORMAL HIGH (ref 75–110)
Glucose (POC): 290 mg/dL — ABNORMAL HIGH (ref 75–110)
Glucose (POC): 344 mg/dL — ABNORMAL HIGH (ref 75–110)
Glucose (POC): 98 mg/dL (ref 75–110)

## 2009-10-08 LAB — MAGNESIUM: Magnesium: 1.6 MG/DL (ref 1.6–2.4)

## 2009-10-08 MED ORDER — INSULIN ASPART 100 UNIT/ML INJECTION
100 unit/mL | Freq: Three times a day (TID) | SUBCUTANEOUS | Status: DC
Start: 2009-10-08 — End: 2009-10-13
  Administered 2009-10-08 – 2009-10-13 (×14): via SUBCUTANEOUS

## 2009-10-08 MED ORDER — WARFARIN 5 MG TAB
5 mg | Freq: Every evening | ORAL | Status: AC
Start: 2009-10-08 — End: 2009-10-08
  Administered 2009-10-08: 22:00:00 via ORAL

## 2009-10-08 MED ORDER — BUMETANIDE 1 MG TAB
1 mg | Freq: Every day | ORAL | Status: DC
Start: 2009-10-08 — End: 2009-10-12
  Administered 2009-10-09 – 2009-10-12 (×4): via ORAL

## 2009-10-08 MED FILL — INSULIN ASPART 100 UNIT/ML INJECTION: 100 unit/mL | SUBCUTANEOUS | Qty: 1

## 2009-10-08 MED FILL — DOK 100 MG CAPSULE: 100 mg | ORAL | Qty: 1

## 2009-10-08 MED FILL — FISH OIL 340 MG-1,000 MG CAPSULE: 340-1000 mg | ORAL | Qty: 2

## 2009-10-08 MED FILL — HEPARIN (PORCINE) IN D5W 25,000 UNIT/250 ML IV: 25000 unit/250 mL(100 unit/mL) | INTRAVENOUS | Qty: 250

## 2009-10-08 MED FILL — LEVOTHYROXINE 150 MCG TAB: 150 mcg | ORAL | Qty: 1

## 2009-10-08 MED FILL — CRESTOR 10 MG TABLET: 10 mg | ORAL | Qty: 2

## 2009-10-08 MED FILL — INSULIN GLARGINE 100 UNIT/ML INJECTION: 100 unit/mL | SUBCUTANEOUS | Qty: 0.2

## 2009-10-08 MED FILL — PANTOPRAZOLE 40 MG TAB, DELAYED RELEASE: 40 mg | ORAL | Qty: 1

## 2009-10-08 MED FILL — TERAZOSIN 5 MG CAP: 5 mg | ORAL | Qty: 2

## 2009-10-08 MED FILL — LISINOPRIL 5 MG TAB: 5 mg | ORAL | Qty: 2

## 2009-10-08 MED FILL — AMLODIPINE 5 MG TAB: 5 mg | ORAL | Qty: 1

## 2009-10-08 MED FILL — PLAVIX 75 MG TABLET: 75 mg | ORAL | Qty: 1

## 2009-10-08 MED FILL — ASPIRIN 81 MG CHEWABLE TAB: 81 mg | ORAL | Qty: 2

## 2009-10-08 MED FILL — FAMOTIDINE 20 MG TAB: 20 mg | ORAL | Qty: 1

## 2009-10-08 MED FILL — COUMADIN 5 MG TABLET: 5 mg | ORAL | Qty: 1

## 2009-10-08 MED FILL — BISACODYL 5 MG TAB, DELAYED RELEASE: 5 mg | ORAL | Qty: 1

## 2009-10-08 MED FILL — METOPROLOL TARTRATE 50 MG TAB: 50 mg | ORAL | Qty: 2

## 2009-10-08 NOTE — Progress Notes (Signed)
Patient accidentally pulled IV out.  Heparin drip stopped until new IV is placed.  Nurse explained to patient the need for IV access and continuation of heparin. Patient in agreement.

## 2009-10-08 NOTE — Progress Notes (Signed)
Patient stayed in chair majority of the night.  No complaints of chest pain, HA, N/V.

## 2009-10-08 NOTE — Progress Notes (Signed)
Ptt drawn for heparin drip by phlebotomy.

## 2009-10-08 NOTE — Progress Notes (Addendum)
Ptt 83.2. Result reviewed with pharmacist, and new rate will be 8.25 per pharmacist and heparin drip protocol, which states to decrease rate by 2 units/kg/hr. Rate set at 8.3 as pump will not set at 8.25. Next ptt due 0430; order placed by RN per protocol.

## 2009-10-08 NOTE — Progress Notes (Signed)
TRANSFER - OUT REPORT:    Verbal report given to Verlfht RN  on Travis [name]  being transferred to Med Surg(unit) for routine progression of care       Report consisted of patient???s Situation, Background, Assessment and   Recommendations(SBAR).     Information from the following report(s) SBAR was reviewed with the receiving nurse.    Opportunity for questions and clarification was provided.

## 2009-10-08 NOTE — Progress Notes (Signed)
[facility] Pharmacy Dosing Services: 10/08/2009    Consult for Warfarin Dosing by Pharmacy by Dr. Selena Batten  Consult provided for this 69 y.o. year old male , for indication of PVD.    Order entered for  Warfarin  9 (mg) at 1800 to be given today. Dose to achieve INR goal of  2-3    Significant drug interactions: levothyroxine,aspirin chronic medications  Previous dose given 7.5 mg   PT/INR Lab Results   Component Value Date/Time    INR 1.2 10/08/2009  5:36 AM        Platelets Lab Results   Component Value Date/Time    PLATELET 209 10/08/2009  5:36 AM        H/H Lab Results   Component Value Date/Time    HGB 8.4 10/08/2009  5:36 AM          Pharmacy to follow daily and will provide subsequent Warfarin dosing based on clinical status.  CARYN A DEFRAIA)  Contact information [phone]

## 2009-10-08 NOTE — Progress Notes (Signed)
Bedside and Verbal shift change report given to Vernona Rieger, RN (oncoming nurse) by Bo Merino, RN (offgoing nurse).  Report given with SBAR, Kardex and Recent Results.

## 2009-10-08 NOTE — Progress Notes (Signed)
Bedside and Verbal shift change report given to G. Dent (oncoming nurse) by C.Ryane Konieczny (offgoing nurse).  Report given with SBAR, Kardex, Intake/Output, MAR and Recent Results. Grasiela Jonsson D Glema Takaki, RN

## 2009-10-08 NOTE — Progress Notes (Addendum)
[facility]  [address] [place], [place] [address]  [phone]      Medical Progress Note      NAME: Travis [name]   DOB:  Dec 04, 1940  MRM:  [id number]    Date/Time: 10/08/2009  7:39 AM          Subjective:     Chief Complaint:  Chest pain: no further since admission    ROS:  (bold if positive, if negative)                     Tolerating Diet          Objective:       Vitals:          Last 24hrs VS reviewed since prior progress note. Most recent are:    Visit Vitals   Item Reading   ??? BP 157/69   ??? Pulse 100   ??? Temp 98.1 ??F (36.7 ??C)   ??? Resp 18   ??? Ht 5\' 6"    ??? Wt 182 lb   ??? BMI 29.38 kg/m2   ??? SpO2 96%         SpO2 Readings from Last 6 Encounters:   10/08/09 96%   10/08/09 96%   03/31/09 94%        O2 Flow Rate (L/min): 2 l/min     Intake/Output Summary (Last 24 hours) at 10/08/09 0739  Last data filed at 10/08/09 6962   Gross per 24 hour   Intake 1727.53 ml   Output   2625 ml   Net -897.47 ml            Exam:     Physical Exam:    Gen:  Well-developed, well-nourished, in no acute distress  HEENT:  Pink conjunctivae, PERRL, hearing intact to voice, moist mucous membranes  Neck:  Supple, without masses, thyroid non-tender  Resp:  No accessory muscle use, clear breath sounds without wheezes rales or rhonchi  Card:  No murmurs, normal S1, S2 without thrills, bruits; trace peripheral edema bilaterally  Abd:  Soft, non-tender, non-distended, normoactive bowel sounds are present, no palpable organomegaly and no detectable hernias  Lymph:  No cervical or inguinal adenopathy  Musc:  No cyanosis or clubbing  Skin:  No rashes or ulcers, skin turgor is good  Neuro:  Cranial nerves are grossly intact, no focal motor weakness, follows commands appropriately  Psych:  Good insight, oriented to person, place and time, alert       Telemetry reviewed:   paced    Medications Reviewed: (see below)    Lab Data Reviewed: (see below)     ______________________________________________________________________    Medications:     Current facility-administered medications   Medication Dose Route Frequency   ??? bumetanide (BUMEX) tablet 1 mg  1 mg Oral DAILY   ??? warfarin (COUMADIN) tablet 7.5 mg  7.5 mg Oral QPM   ??? metoprolol (LOPRESSOR) tablet 100 mg  100 mg Oral BID   ??? amlodipine (NORVASC) tablet 5 mg  5 mg Oral DAILY   ??? lisinopril (PRINIVIL, ZESTRIL) tablet 10 mg  10 mg Oral DAILY   ??? Warfarin: Dosing per Pharmacy   Other PRN   ??? insulin glargine (LANTUS) injection 20 Units  20 Units SubCUTAneous DAILY   ??? insulin aspart (NOVOLOG) 100 unit/mL injection 6 Units  6 Units SubCUTAneous TIDAC   ??? pantoprazole (PROTONIX) tablet 40 mg  40 mg Oral ACD   ???  epoetin alfa (EPOGEN;PROCRIT) injection 10,000 Units  10,000 Units SubCUTAneous Q7D   ??? aspirin chewable tablet 162 mg  162 mg Oral DAILY   ??? heparin 25,000 units in D5W 250 ml infusion  12-25 Units/kg/hr IntraVENous TITRATE   ??? heparin (porcine) injection 4,000 Units  4,000 Units IntraVENous PRN   ??? heparin (porcine) injection 2,000 Units  2,000 Units IntraVENous PRN   ??? dextrose (D50W) injection Syrg 25 g  25 g IntraVENous PRN   ??? insulin aspart (NOVOLOG)   SubCUTAneous AC&HS   ??? clopidogrel (PLAVIX) tablet 75 mg  75 mg Oral DAILY   ??? levothyroxine (SYNTHROID) tablet 150 mcg  150 mcg Oral ACB   ??? nitroglycerin (NITROSTAT) tablet 0.4 mg  0.4 mg SubLINGual Q5MIN PRN   ??? fish oil-omega-3 fatty acids 340-1,000 mg capsule 2 Cap  2 Cap Oral BID   ??? rosuvastatin (CRESTOR) tablet 20 mg  20 mg Oral DAILY   ??? terazosin (HYTRIN) capsule 10 mg  10 mg Oral QHS   ??? docusate sodium (COLACE) capsule 100 mg  100 mg Oral BID   ??? bisacodyl (DULCOLAX) tablet 5 mg  5 mg Oral DAILY PRN   ??? bisacodyl (DULCOLAX) suppository 10 mg  10 mg Rectal DAILY PRN   ??? oxycodone-acetaminophen (PERCOCET) 5-325 mg per tablet 2 Tab  2 Tab Oral Q6H PRN   ??? morphine injection 2 mg  2 mg IntraVENous Q4H PRN    ??? glucose chewable tablet 12 g  12 g Oral PRN   ??? glucagon (GLUCAGEN) injection 1 mg  1 mg IntraMUSCular PRN   ??? famotidine (PEPCID) tablet 20 mg  20 mg Oral DAILY                Lab Review:     Recent Labs   Basename 10/08/09 0536 10/06/09 0432   ??? WBC 5.6 5.6   ??? HGB 8.4* 8.0*   ??? HCT 25.1* 23.9*   ??? PLT 209 178         Recent Labs   Basename 10/08/09 0536 10/07/09 0750 10/06/09 0850 10/05/09 1800   ??? NA 136 -- -- --   ??? K 3.6 -- -- --   ??? CL 98 -- -- --   ??? CO2 31 -- -- --   ??? GLU 201* -- -- --   ??? BUN 23* -- -- --   ??? CREA 1.6* -- -- --   ??? CA 8.7 -- -- --   ??? MG 1.6 -- -- 1.8   ??? PHOS 4.2 -- -- --   ??? ALB -- -- -- --   ??? TBIL -- -- -- --   ??? SGOT -- -- -- --   ??? INR 1.2* 1.2* 1.1 --         Lab Results   Component Value Date/Time    POC GLUCOSE 215 10/07/2009 11:06 PM    POC GLUCOSE 156 10/07/2009  6:48 PM    POC GLUCOSE 67 10/07/2009  4:39 PM    POC GLUCOSE 67 10/07/2009  4:19 PM    POC GLUCOSE 267 10/07/2009 12:17 PM           No results found for this basename: PH:3,PCO2:3,PO2:3,HCO3:3,FIO2:3 in the last 72 hours  Recent Labs   Basename 10/08/09 0536 10/07/09 0750 10/06/09 0850   ??? INR 1.2* 1.2* 1.1              Assessment:     Patient Active Hospital Problem List:  *  Coronary atherosclerosis of native coronary artery (09/29/2009)    Chronic kidney disease, stage III (moderate) (09/29/2009)    Anemia, unspecified (09/29/2009)    Back pain (09/29/2009)    Acute myocardial infarction, unspecified site, initial episode of care (09/30/2009)    Diabetes (10/05/2009)    PVD (peripheral vascular disease) (10/06/2009)    Hyperlipidemia (10/06/2009)         Plan:     Patient Active Hospital Problem List:  Coronary atherosclerosis of native coronary artery (09/29/2009)   - per Cards no plans for further cath   - continue medical management    Chronic kidney disease, stage III (moderate) (09/29/2009)   - creat up a little   - will hold Bumex today and restart tomorrow once daily    Anemia, unspecified (09/29/2009)   - Hgb stable, follow     Back pain (09/29/2009)    Acute myocardial infarction, unspecified site, initial episode of care (09/30/2009)   - as above    Diabetes (10/05/2009)   - appears to be getting too much insulin before meals   - will decrease meal time insulin to 4 units   - follow blood sugars    PVD (peripheral vascular disease) (10/06/2009)   - per Dr. Carmell Austria continue IV heparin until INR is therapeutic   - warfarin restarted    Hyperlipidemia (10/06/2009)   - continue lipid lowering medications      Total time spent with patient: 35 minutes                  Care Plan discussed with: Patient and Nursing Staff    Discussed:  Care Plan and D/C Planning    Prophylaxis:  Coumadin and IV heparin    Disposition:  Home w/Family           ___________________________________________________    Attending Physician: Marijo File, MD

## 2009-10-08 NOTE — Progress Notes (Signed)
PTT 58.8 @ therapeutic range/no rate change.  Next PTT per policy scheduled for 10/08/09 @ 0630.

## 2009-10-08 NOTE — Progress Notes (Signed)
TRANSFER - IN REPORT:    Verbal report received from Dent, RN via Engineer, structural, RN to (name) on JIDENNA FIGGS  being received from PCC(unit) for routine progression of care      Report consisted of patient???s Situation, Background, Assessment and   Recommendations(SBAR).     Information from the following report(s) SBAR was reviewed with the receiving nurse.    Opportunity for questions and clarification was provided.      Assessment completed upon patient???s arrival to unit and care assumed.

## 2009-10-09 LAB — METABOLIC PANEL, BASIC
Anion gap: 5 mmol/L (ref 5–15)
BUN/Creatinine ratio: 19 (ref 12–20)
BUN: 26 MG/DL — ABNORMAL HIGH (ref 6–20)
CO2: 31 MMOL/L (ref 21–32)
Calcium: 8.6 MG/DL (ref 8.5–10.1)
Chloride: 101 MMOL/L (ref 97–108)
Creatinine: 1.4 MG/DL — ABNORMAL HIGH (ref 0.6–1.3)
GFR est AA: >60 mL/min/{1.73_m2} (ref >60)
GFR est non-AA: 53 mL/min/{1.73_m2} — ABNORMAL LOW (ref >60)
Glucose: 181 MG/DL — ABNORMAL HIGH (ref 65–100)
Potassium: 3.7 MMOL/L (ref 3.5–5.1)
Sodium: 137 MMOL/L (ref 136–145)

## 2009-10-09 LAB — GLUCOSE, POC
Glucose (POC): 202 mg/dL — ABNORMAL HIGH (ref 75–110)
Glucose (POC): 226 mg/dL — ABNORMAL HIGH (ref 75–110)
Glucose (POC): 380 mg/dL — ABNORMAL HIGH (ref 75–110)
Glucose (POC): 58 mg/dL — ABNORMAL LOW (ref 75–110)
Glucose (POC): 78 mg/dL (ref 75–110)

## 2009-10-09 LAB — PROTHROMBIN TIME + INR
INR: 1.2 — ABNORMAL HIGH (ref 0.9–1.1)
Prothrombin time: 11.9 SECS — ABNORMAL HIGH (ref 9.0–11.0)

## 2009-10-09 LAB — PTT
aPTT: 124.9 s — CR (ref 24.0–33.0)
aPTT: 55.3 s — ABNORMAL HIGH (ref 24.0–33.0)
aPTT: 57.3 s — ABNORMAL HIGH (ref 24.0–33.0)
aPTT: 83.2 s — ABNORMAL HIGH (ref 24.0–33.0)

## 2009-10-09 MED ORDER — HEPARIN (PORCINE) IN D5W 25,000 UNIT/250 ML IV
25000 unit/250 mL(100 unit/mL) | INTRAVENOUS | Status: DC
Start: 2009-10-09 — End: 2009-10-15
  Administered 2009-10-09 – 2009-10-15 (×17): via INTRAVENOUS

## 2009-10-09 MED ORDER — WARFARIN 5 MG TAB
5 mg | Freq: Every evening | ORAL | Status: AC
Start: 2009-10-09 — End: 2009-10-09
  Administered 2009-10-09: 22:00:00 via ORAL

## 2009-10-09 MED ORDER — INSULIN ASPART 100 UNIT/ML INJECTION
100 unit/mL | Freq: Once | SUBCUTANEOUS | Status: AC
Start: 2009-10-09 — End: 2009-10-09
  Administered 2009-10-09: 17:00:00 via SUBCUTANEOUS

## 2009-10-09 MED FILL — TERAZOSIN 5 MG CAP: 5 mg | ORAL | Qty: 2

## 2009-10-09 MED FILL — PANTOPRAZOLE 40 MG TAB, DELAYED RELEASE: 40 mg | ORAL | Qty: 1

## 2009-10-09 MED FILL — DOK 100 MG CAPSULE: 100 mg | ORAL | Qty: 1

## 2009-10-09 MED FILL — INSULIN ASPART 100 UNIT/ML INJECTION: 100 unit/mL | SUBCUTANEOUS | Qty: 1

## 2009-10-09 MED FILL — BUMETANIDE 1 MG TAB: 1 mg | ORAL | Qty: 1

## 2009-10-09 MED FILL — LISINOPRIL 5 MG TAB: 5 mg | ORAL | Qty: 2

## 2009-10-09 MED FILL — INSULIN ASPART 100 UNIT/ML INJECTION: 100 unit/mL | SUBCUTANEOUS | Qty: 6

## 2009-10-09 MED FILL — CRESTOR 10 MG TABLET: 10 mg | ORAL | Qty: 2

## 2009-10-09 MED FILL — FAMOTIDINE 20 MG TAB: 20 mg | ORAL | Qty: 1

## 2009-10-09 MED FILL — INSULIN GLARGINE 100 UNIT/ML INJECTION: 100 unit/mL | SUBCUTANEOUS | Qty: 0.2

## 2009-10-09 MED FILL — METOPROLOL TARTRATE 50 MG TAB: 50 mg | ORAL | Qty: 2

## 2009-10-09 MED FILL — HEPARIN (PORCINE) IN D5W 25,000 UNIT/250 ML IV: 25000 unit/250 mL(100 unit/mL) | INTRAVENOUS | Qty: 250

## 2009-10-09 MED FILL — AMLODIPINE 5 MG TAB: 5 mg | ORAL | Qty: 1

## 2009-10-09 MED FILL — COUMADIN 5 MG TABLET: 5 mg | ORAL | Qty: 1

## 2009-10-09 MED FILL — PLAVIX 75 MG TABLET: 75 mg | ORAL | Qty: 1

## 2009-10-09 MED FILL — PROCRIT 10,000 UNIT/ML INJECTION SOLUTION: 10000 unit/mL | INTRAMUSCULAR | Qty: 1

## 2009-10-09 MED FILL — ASPIRIN 81 MG CHEWABLE TAB: 81 mg | ORAL | Qty: 2

## 2009-10-09 MED FILL — FISH OIL 340 MG-1,000 MG CAPSULE: 340-1000 mg | ORAL | Qty: 2

## 2009-10-09 MED FILL — LEVOTHYROXINE 150 MCG TAB: 150 mcg | ORAL | Qty: 1

## 2009-10-09 NOTE — Progress Notes (Signed)
Cardiopulmonary Rehab:  Chart reviewed.  Printed CHF teaching materials provided.  Will continue to follow.

## 2009-10-09 NOTE — Progress Notes (Signed)
DTC Progress Note    Recommendations/ Comments: If appropriate, please consider increasing Lantus dose.    Chart reviewed on Travis [name].    Patient is a 69 y.o. male with DM on Lantus 30 units and Novolog  s. scale at home.    A1c:   Lab Results   Component Value Date/Time    Hemoglobin A1C 8.7 06/22/2009  4:55 AM         POC Glucose last 24hrs:   Lab Results   Component Value Date/Time    POC GLUCOSE 380 10/09/2009 11:53 AM    POC GLUCOSE 226 10/09/2009  7:14 AM    POC GLUCOSE 202 10/08/2009  8:20 PM    POC GLUCOSE 98 10/08/2009  5:06 PM    POC GLUCOSE 344 10/08/2009 12:14 PM    POC GLUCOSE 290 10/08/2009  7:21 AM    POC GLUCOSE 215 10/07/2009 11:06 PM    POC GLUCOSE 156 10/07/2009  6:48 PM         Lab Results   Component Value Date/Time    Creatinine 1.4 10/09/2009  4:40 AM           Current hospital DM medications: Lantus 20 units each day, Novolog 4 units AC meals and Novolog s.scale.     Will continue to follow as needed.    Thank you.    Elenor Quinones RN, CDE

## 2009-10-09 NOTE — Progress Notes (Signed)
[facility] [facility]  [address] [place], [place] [address]  [phone]      Medical Progress Note      NAME: Travis [name]   DOB:  March 07, 1941  MRM:  [id number]    Date/Time: 10/09/2009  7:46 AM           Subjective:     Chief Complaint:  Chest pain: no further since admission    ROS:  (bold if positive, if negative)                     Tolerating Diet          Objective:       Vitals:          Last 24hrs VS reviewed since prior progress note. Most recent are:    Visit Vitals   Item Reading   ??? BP 155/64   ??? Pulse 82   ??? Temp 98.3 ??F (36.8 ??C)   ??? Resp 15   ??? Ht 5\' 6"    ??? Wt 182 lb   ??? BMI 29.38 kg/m2   ??? SpO2 100%         SpO2 Readings from Last 6 Encounters:   10/09/09 100%   10/09/09 100%   03/31/09 94%        O2 Flow Rate (L/min): 2 l/min     Intake/Output Summary (Last 24 hours) at 10/09/09 0746  Last data filed at 10/09/09 2130   Gross per 24 hour   Intake      0 ml   Output    200 ml   Net   -200 ml            Exam:     Physical Exam:    Gen:  Well-developed, well-nourished, in no acute distress  HEENT:  Pink conjunctivae, PERRL, hearing intact to voice, moist mucous membranes  Neck:  Supple, without masses, thyroid non-tender  Resp:  No accessory muscle use, clear breath sounds without wheezes rales or rhonchi  Card:  No murmurs, normal S1, S2 without thrills, bruits; trace peripheral edema bilaterally  Abd:  Soft, non-tender, non-distended, normoactive bowel sounds are present, no palpable organomegaly and no detectable hernias  Lymph:  No cervical or inguinal adenopathy  Musc:  No cyanosis or clubbing  Skin:  No rashes or ulcers, skin turgor is good  Neuro:  Cranial nerves are grossly intact, no focal motor weakness, follows commands appropriately  Psych:  Good insight, oriented to person, place and time, alert      Medications Reviewed: (see below)    Lab Data Reviewed: (see below)    ______________________________________________________________________    Medications:      Current facility-administered medications   Medication Dose Route Frequency   ??? bumetanide (BUMEX) tablet 1 mg  1 mg Oral DAILY   ??? insulin aspart (NOVOLOG) 100 unit/mL injection 4 Units  4 Units SubCUTAneous TIDAC   ??? warfarin (COUMADIN) tablet 9 mg  9 mg Oral QPM   ??? metoprolol (LOPRESSOR) tablet 100 mg  100 mg Oral BID   ??? amlodipine (NORVASC) tablet 5 mg  5 mg Oral DAILY   ??? lisinopril (PRINIVIL, ZESTRIL) tablet 10 mg  10 mg Oral DAILY   ??? Warfarin: Dosing per Pharmacy   Other PRN   ??? insulin glargine (LANTUS) injection 20 Units  20 Units SubCUTAneous DAILY   ??? pantoprazole (PROTONIX) tablet 40 mg  40 mg Oral ACD   ???  epoetin alfa (EPOGEN;PROCRIT) injection 10,000 Units  10,000 Units SubCUTAneous Q7D   ??? aspirin chewable tablet 162 mg  162 mg Oral DAILY   ??? heparin 25,000 units in D5W 250 ml infusion  12-25 Units/kg/hr IntraVENous TITRATE   ??? heparin (porcine) injection 4,000 Units  4,000 Units IntraVENous PRN   ??? heparin (porcine) injection 2,000 Units  2,000 Units IntraVENous PRN   ??? dextrose (D50W) injection Syrg 25 g  25 g IntraVENous PRN   ??? insulin aspart (NOVOLOG)   SubCUTAneous AC&HS   ??? clopidogrel (PLAVIX) tablet 75 mg  75 mg Oral DAILY   ??? levothyroxine (SYNTHROID) tablet 150 mcg  150 mcg Oral ACB   ??? nitroglycerin (NITROSTAT) tablet 0.4 mg  0.4 mg SubLINGual Q5MIN PRN   ??? fish oil-omega-3 fatty acids 340-1,000 mg capsule 2 Cap  2 Cap Oral BID   ??? rosuvastatin (CRESTOR) tablet 20 mg  20 mg Oral DAILY   ??? terazosin (HYTRIN) capsule 10 mg  10 mg Oral QHS   ??? docusate sodium (COLACE) capsule 100 mg  100 mg Oral BID   ??? bisacodyl (DULCOLAX) tablet 5 mg  5 mg Oral DAILY PRN   ??? bisacodyl (DULCOLAX) suppository 10 mg  10 mg Rectal DAILY PRN   ??? oxycodone-acetaminophen (PERCOCET) 5-325 mg per tablet 2 Tab  2 Tab Oral Q6H PRN   ??? morphine injection 2 mg  2 mg IntraVENous Q4H PRN   ??? glucose chewable tablet 12 g  12 g Oral PRN   ??? glucagon (GLUCAGEN) injection 1 mg  1 mg IntraMUSCular PRN    ??? famotidine (PEPCID) tablet 20 mg  20 mg Oral DAILY                Lab Review:     Recent Labs   [facility] 10/08/09 0536   ??? WBC 5.6   ??? HGB 8.4*   ??? HCT 25.1*   ??? PLT 209         Recent Labs   Basename 10/09/09 0440 10/08/09 0536 10/07/09 0750   ??? NA 137 136 --   ??? K 3.7 3.6 --   ??? CL 101 98 --   ??? CO2 31 31 --   ??? GLU 181* 201* --   ??? BUN 26* 23* --   ??? CREA 1.4* 1.6* --   ??? CA 8.6 8.7 --   ??? MG -- 1.6 --   ??? PHOS -- 4.2 --   ??? ALB -- -- --   ??? TBIL -- -- --   ??? SGOT -- -- --   ??? INR 1.2* 1.2* 1.2*         Lab Results   Component Value Date/Time    POC GLUCOSE 202 10/08/2009  8:20 PM    POC GLUCOSE 98 10/08/2009  5:06 PM    POC GLUCOSE 344 10/08/2009 12:14 PM    POC GLUCOSE 290 10/08/2009  7:21 AM    POC GLUCOSE 215 10/07/2009 11:06 PM           No results found for this basename: PH:3,PCO2:3,PO2:3,HCO3:3,FIO2:3 in the last 72 hours  Recent Labs   Basename 10/09/09 0440 10/08/09 0536 10/07/09 0750   ??? INR 1.2* 1.2* 1.2*              Assessment:     Patient Active Hospital Problem List:  *Coronary atherosclerosis of native coronary artery (09/29/2009)    Chronic kidney disease, stage III (moderate) (09/29/2009)    Anemia, unspecified (  09/29/2009)    Back pain (09/29/2009)    Acute myocardial infarction, unspecified site, initial episode of care (09/30/2009)    Diabetes (10/05/2009)    PVD (peripheral vascular disease) (10/06/2009)    Hyperlipidemia (10/06/2009)         Plan:     Patient Active Hospital Problem List:  Coronary atherosclerosis of native coronary artery (09/29/2009)   - per Cards no plans for further cath   - continue medical management    Chronic kidney disease, stage III (moderate) (09/29/2009)   - creat improved after holding Bumex   - resume Bumex today with once daily dosing    Anemia, unspecified (09/29/2009)   - Hgb stable, follow    Back pain (09/29/2009)    Acute myocardial infarction, unspecified site, initial episode of care (09/30/2009)   - as above    Diabetes (10/05/2009)    - appears to be getting too much insulin before meals   - will decrease meal time insulin to 4 units   - follow blood sugars    PVD (peripheral vascular disease) (10/06/2009)   - per Dr. Carmell Austria continue IV heparin until INR is therapeutic   - warfarin restarted, INR rising very slowly, titrating warfarin dosing per protocol    Hyperlipidemia (10/06/2009)   - continue lipid lowering medications      Total time spent with patient: 35 minutes                  Care Plan discussed with: Patient and Nursing Staff    Discussed:  Care Plan and D/C Planning    Prophylaxis:  Coumadin and IV heparin    Disposition:  Home w/Family           ___________________________________________________    Attending Physician: Marijo File, MD

## 2009-10-09 NOTE — Progress Notes (Signed)
[facility] Dosing Services: 10/09/09    Consult for Warfarin Dosing by Pharmacy by Dr. Selena Batten  Consult provided for this 69 y.o. year old male , for indication of PVD    Order entered for  Warfarin  9 (mg) at 1800 to be given today. Dose to achieve INR goal of  2-3    Significant drug interactions: levothyroxine,aspirin chronic medications  Previous dose given 9 mg   PT/INR Lab Results   Component Value Date/Time    INR 1.2 10/09/2009  4:40 AM        Platelets Lab Results   Component Value Date/Time    PLATELET 209 10/08/2009  5:36 AM        H/H Lab Results   Component Value Date/Time    HGB 8.4 10/08/2009  5:36 AM          Pharmacy to follow daily and will provide subsequent Warfarin dosing based on clinical status.  CANDICE D DUNCAN)  Contact information [phone]

## 2009-10-09 NOTE — Progress Notes (Signed)
Ptt 55.3. Result reviewed with pharmacist. Per pharmacist and heparin drip protocol, rate is to be increased by 1 unit/kg/hr. New rate is 9.9 ml/hr. Next ptt due 1145. Order entered per protocol by RN.

## 2009-10-09 NOTE — Progress Notes (Signed)
Location: 5MS1 - [id number]  Attn.: Do, Khoi B.  DOB: 21-May-1941 / Age: 69  MR#: [id number] / Admit#: [id number]  Pt. First Name: Travis  Pt. Last Name: [name] [facility]  [address] [place], [place] [address]   Case Management - Progress Note  Initial Open Date: 10/09/2009  Case Manager: Lisa Roca    Initial Open Date:  Social Worker:  Expected Date of Discharge:  Transferred From:  ECF Bed Held Until:  Bed Held By:  Power of Attorney:  POA/Guardian/Conservator Capacity:  Primary Caregiver:  Living Arrangements:  Source of Income:  Payee:  Psychosocial History:  Cultural/Religious/Language Issues:  Education Level:  ADLS/Current Living Arrangements Issues:  Past Providers:  Will patient perform self care at discharge?  Anticipated Discharge Disposition Goal:  Assessment/Plan:     10/09/2009 11:22A  I met with the pt to determine potential discharge needs. Pt lives with a  grown son in a 2-story house. He is normally active and drives. He goes to  the [facility] for [facility] care and receives some of his medications from  there also. He has [employer] for the remainder of his medications and pays a  small co-pay. He does not anticipate any discharge needs. I will follow.  Sudy Aadams, BSW, CM  Resources at Discharge:  Service Providers at Discharge:  Dictating Provider: Bonnielee Haff

## 2009-10-09 NOTE — Progress Notes (Signed)
Critical Result Notification    Received and verbally repeated the following test results PTT: 124.9  MD on call  was not notified because critical value covered by order set or previous orders.    Additional comments:See MAR, Heparin drip held at this time per protocol for value.    Ethelene Hal, RN

## 2009-10-09 NOTE — Progress Notes (Signed)
NUTRITION    BEST PRACTICE ALERT:   Braden Score: 22      Nutrition: Excellent      RD PLAN:   Pt continues to eat well with no nutrition risk identified at this time.  Patient likely to meet nutrition needs without additional interventions.  Will see again for nutrition rescreen as indicated.      EMILY Zack Seal, RD

## 2009-10-09 NOTE — Progress Notes (Signed)
Bedside and Verbal shift change report given to Anderson Malta RN (oncoming nurse) by Bo Merino, RN (offgoing nurse).  Report given with SBAR.

## 2009-10-09 NOTE — Progress Notes (Signed)
Bedside shift change report given to Geri, RN (oncoming nurse) by Laura, RN (offgoing nurse).  Report given with SBAR, Kardex, Intake/Output and MAR.

## 2009-10-09 NOTE — Progress Notes (Signed)
[facility] Progress Note    Assessment:     1.FAO:ZHYQMVHQ.  2.CKD:2 to DM,HTN:Baseline cr 1.5-2.0 mg%     -cr at baseline  3.Hypotension:better   4.Proteinuria:2 to DM,on lisinopeil  5.Anemia:ok Iron panel    6.DM     Plan:     1.HTN,DM control  2.Epo out  patient  3.no need for Iron      Subjective:     HPI: Patient seen.    Patient denies any Chest pain/palpitations.    Patient denies any shortness of breath,cough,phlem.      Current facility-administered medications   Medication Dose Route Frequency   ??? bumetanide (BUMEX) tablet 1 mg  1 mg Oral DAILY   ??? insulin aspart (NOVOLOG) 100 unit/mL injection 4 Units  4 Units SubCUTAneous TIDAC   ??? warfarin (COUMADIN) tablet 9 mg  9 mg Oral QPM   ??? metoprolol (LOPRESSOR) tablet 100 mg  100 mg Oral BID   ??? amlodipine (NORVASC) tablet 5 mg  5 mg Oral DAILY   ??? lisinopril (PRINIVIL, ZESTRIL) tablet 10 mg  10 mg Oral DAILY   ??? Warfarin: Dosing per Pharmacy   Other PRN   ??? insulin glargine (LANTUS) injection 20 Units  20 Units SubCUTAneous DAILY   ??? pantoprazole (PROTONIX) tablet 40 mg  40 mg Oral ACD   ??? epoetin alfa (EPOGEN;PROCRIT) injection 10,000 Units  10,000 Units SubCUTAneous Q7D   ??? aspirin chewable tablet 162 mg  162 mg Oral DAILY   ??? heparin 25,000 units in D5W 250 ml infusion  12-25 Units/kg/hr IntraVENous TITRATE   ??? heparin (porcine) injection 4,000 Units  4,000 Units IntraVENous PRN   ??? heparin (porcine) injection 2,000 Units  2,000 Units IntraVENous PRN   ??? dextrose (D50W) injection Syrg 25 g  25 g IntraVENous PRN   ??? insulin aspart (NOVOLOG)   SubCUTAneous AC&HS   ??? clopidogrel (PLAVIX) tablet 75 mg  75 mg Oral DAILY   ??? levothyroxine (SYNTHROID) tablet 150 mcg  150 mcg Oral ACB   ??? nitroglycerin (NITROSTAT) tablet 0.4 mg  0.4 mg SubLINGual Q5MIN PRN   ??? fish oil-omega-3 fatty acids 340-1,000 mg capsule 2 Cap  2 Cap Oral BID   ??? rosuvastatin (CRESTOR) tablet 20 mg  20 mg Oral DAILY   ??? terazosin (HYTRIN) capsule 10 mg  10 mg Oral QHS    ??? docusate sodium (COLACE) capsule 100 mg  100 mg Oral BID   ??? bisacodyl (DULCOLAX) tablet 5 mg  5 mg Oral DAILY PRN   ??? bisacodyl (DULCOLAX) suppository 10 mg  10 mg Rectal DAILY PRN   ??? oxycodone-acetaminophen (PERCOCET) 5-325 mg per tablet 2 Tab  2 Tab Oral Q6H PRN   ??? morphine injection 2 mg  2 mg IntraVENous Q4H PRN   ??? glucose chewable tablet 12 g  12 g Oral PRN   ??? glucagon (GLUCAGEN) injection 1 mg  1 mg IntraMUSCular PRN   ??? famotidine (PEPCID) tablet 20 mg  20 mg Oral DAILY            Review of Systems  Pertinent items are noted in HPI.    Objective:     Vitals:  Blood pressure 155/64, pulse 82, temperature 98.3 ??F (36.8 ??C), resp. rate 15, height 5\' 6"  (1.676 m), weight 182 lb (82.555 kg), SpO2 100.00%.  Temp (24hrs), Avg:98.2 ??F (36.8 ??C), Min:97.7 ??F (36.5 ??C), Max:98.5 ??F (36.9 ??C)      Intake and Output:  In: -  Out: 200 [Urine:200]  In: 1727.5 [P.O.:740; I.V.:987.5]  Out: 2625 [Urine:2625]    Physical Exam:                Patient is not currently intubated    GEN: NAD   RS: CTA, negative for crackles   CVS: S1S2  ABD: Soft, Non Tender  GU:  No Foley   EXT: TRACE Edema  SKIN: No Rash   NEURO: Grossly focal      ECG: unchanged from previous tracings     Data Review        No results found for this basename: ITNL in the last 72 hours     No results found for this basename: CPK:3,CKMB:3,TROIQ:3, in the last 72 hours    Recent Labs   Basename 10/09/09 0440 10/08/09 0536   ??? NA 137 136   ??? K 3.7 3.6   ??? CL 101 98   ??? CO2 31 31   ??? BUN 26* 23*   ??? CREA 1.4* 1.6*   ??? GLU 181* 201*   ??? PHOS -- 4.2   ??? MG -- 1.6   ??? ALB -- --   ??? WBC -- 5.6   ??? HGB -- 8.4*   ??? HCT -- 25.1*   ??? PLT -- 209         Recent Labs   Basename 10/09/09 0440 10/08/09 0536 10/07/09 0750   ??? INR 1.2* 1.2* 1.2*   ??? PTP 11.9* 11.7* 11.5*   ??? APTT -- -- --       Needs: urine analysis, urine sodium, protein and creatinine  Lab Results   Component Value Date/Time    Creatinine,urine random 33.7 05/31/2009  7:20 PM             Discussed with:     Patient

## 2009-10-09 NOTE — Progress Notes (Signed)
Am labs and ptt for heparin drip drawn by RN.

## 2009-10-10 LAB — CBC W/O DIFF
HCT: 25.2 % — ABNORMAL LOW (ref 36.6–50.3)
HGB: 8.1 g/dL — ABNORMAL LOW (ref 12.1–17.0)
MCH: 28.8 PG (ref 26.0–34.0)
MCHC: 32.1 g/dL (ref 30.0–36.5)
MCV: 89.7 FL (ref 80.0–99.0)
PLATELET: 259 10*3/uL (ref 150–400)
RBC: 2.81 M/uL — ABNORMAL LOW (ref 4.10–5.70)
RDW: 17 % — ABNORMAL HIGH (ref 11.5–14.5)
WBC: 5.8 10*3/uL (ref 4.1–11.1)

## 2009-10-10 LAB — PROTHROMBIN TIME + INR
INR: 1.4 — ABNORMAL HIGH (ref 0.9–1.1)
Prothrombin time: 13.2 SECS — ABNORMAL HIGH (ref 9.0–11.0)

## 2009-10-10 LAB — PTT
aPTT: 78.7 s — ABNORMAL HIGH (ref 24.0–33.0)
aPTT: 71.1 s — ABNORMAL HIGH (ref 24.0–33.0)
aPTT: 62.4 s — ABNORMAL HIGH (ref 24.0–33.0)

## 2009-10-10 LAB — GLUCOSE, POC
Glucose (POC): 182 mg/dL — ABNORMAL HIGH (ref 75–110)
Glucose (POC): 123 mg/dL — ABNORMAL HIGH (ref 75–110)
Glucose (POC): 316 mg/dL — ABNORMAL HIGH (ref 75–110)
Glucose (POC): 162 mg/dL — ABNORMAL HIGH (ref 75–110)
Glucose (POC): 81 mg/dL (ref 75–110)

## 2009-10-10 MED ORDER — WARFARIN 5 MG TAB
5 mg | Freq: Every evening | ORAL | Status: AC
Start: 2009-10-10 — End: 2009-10-10
  Administered 2009-10-10: 22:00:00 via ORAL

## 2009-10-10 MED FILL — INSULIN GLARGINE 100 UNIT/ML INJECTION: 100 unit/mL | SUBCUTANEOUS | Qty: 0.2

## 2009-10-10 MED FILL — PANTOPRAZOLE 40 MG TAB, DELAYED RELEASE: 40 mg | ORAL | Qty: 1

## 2009-10-10 MED FILL — DOK 100 MG CAPSULE: 100 mg | ORAL | Qty: 1

## 2009-10-10 MED FILL — INSULIN ASPART 100 UNIT/ML INJECTION: 100 unit/mL | SUBCUTANEOUS | Qty: 4

## 2009-10-10 MED FILL — FISH OIL 340 MG-1,000 MG CAPSULE: 340-1000 mg | ORAL | Qty: 2

## 2009-10-10 MED FILL — METOPROLOL TARTRATE 50 MG TAB: 50 mg | ORAL | Qty: 2

## 2009-10-10 MED FILL — INSULIN ASPART 100 UNIT/ML INJECTION: 100 unit/mL | SUBCUTANEOUS | Qty: 1

## 2009-10-10 MED FILL — PLAVIX 75 MG TABLET: 75 mg | ORAL | Qty: 1

## 2009-10-10 MED FILL — ASPIRIN 81 MG CHEWABLE TAB: 81 mg | ORAL | Qty: 2

## 2009-10-10 MED FILL — LEVOTHYROXINE 150 MCG TAB: 150 mcg | ORAL | Qty: 1

## 2009-10-10 MED FILL — TERAZOSIN 5 MG CAP: 5 mg | ORAL | Qty: 2

## 2009-10-10 MED FILL — BUMETANIDE 1 MG TAB: 1 mg | ORAL | Qty: 1

## 2009-10-10 MED FILL — LISINOPRIL 5 MG TAB: 5 mg | ORAL | Qty: 2

## 2009-10-10 MED FILL — FAMOTIDINE 20 MG TAB: 20 mg | ORAL | Qty: 1

## 2009-10-10 MED FILL — COUMADIN 5 MG TABLET: 5 mg | ORAL | Qty: 1

## 2009-10-10 MED FILL — AMLODIPINE 5 MG TAB: 5 mg | ORAL | Qty: 1

## 2009-10-10 MED FILL — CRESTOR 10 MG TABLET: 10 mg | ORAL | Qty: 2

## 2009-10-10 NOTE — Progress Notes (Signed)
[facility] Progress Note    Assessment:     1.NUU:[id number].  2.CKD:2 to DM,HTN:Baseline cr 1.5-2.0 mg%     -cr at baseline  3.Hypotension:better   4.Proteinuria:2 to DM,on lisinopeil  5.Anemia:ok Iron panel    6.DM     Plan:     1.HTN,DM control  2.Epo out  patient        Subjective:     HPI: Patient seen.    Patient denies any Chest pain/palpitations.    Patient denies any shortness of breath,cough,phlem.      Current facility-administered medications   Medication Dose Route Frequency   ??? warfarin (COUMADIN) tablet 9 mg  9 mg Oral QPM   ??? insulin aspart (NOVOLOG) 100 unit/mL injection 15 Units  15 Units SubCUTAneous ONCE   ??? heparin 25,000 units in D5W 250 ml infusion  12-25 Units/kg/hr IntraVENous TITRATE   ??? bumetanide (BUMEX) tablet 1 mg  1 mg Oral DAILY   ??? insulin aspart (NOVOLOG) 100 unit/mL injection 4 Units  4 Units SubCUTAneous TIDAC   ??? metoprolol (LOPRESSOR) tablet 100 mg  100 mg Oral BID   ??? amlodipine (NORVASC) tablet 5 mg  5 mg Oral DAILY   ??? lisinopril (PRINIVIL, ZESTRIL) tablet 10 mg  10 mg Oral DAILY   ??? Warfarin: Dosing per Pharmacy   Other PRN   ??? insulin glargine (LANTUS) injection 20 Units  20 Units SubCUTAneous DAILY   ??? pantoprazole (PROTONIX) tablet 40 mg  40 mg Oral ACD   ??? epoetin alfa (EPOGEN;PROCRIT) injection 10,000 Units  10,000 Units SubCUTAneous Q7D   ??? aspirin chewable tablet 162 mg  162 mg Oral DAILY   ??? heparin (porcine) injection 4,000 Units  4,000 Units IntraVENous PRN   ??? heparin (porcine) injection 2,000 Units  2,000 Units IntraVENous PRN   ??? dextrose (D50W) injection Syrg 25 g  25 g IntraVENous PRN   ??? insulin aspart (NOVOLOG)   SubCUTAneous AC&HS   ??? clopidogrel (PLAVIX) tablet 75 mg  75 mg Oral DAILY   ??? levothyroxine (SYNTHROID) tablet 150 mcg  150 mcg Oral ACB   ??? nitroglycerin (NITROSTAT) tablet 0.4 mg  0.4 mg SubLINGual Q5MIN PRN   ??? fish oil-omega-3 fatty acids 340-1,000 mg capsule 2 Cap  2 Cap Oral BID    ??? rosuvastatin (CRESTOR) tablet 20 mg  20 mg Oral DAILY   ??? terazosin (HYTRIN) capsule 10 mg  10 mg Oral QHS   ??? docusate sodium (COLACE) capsule 100 mg  100 mg Oral BID   ??? bisacodyl (DULCOLAX) tablet 5 mg  5 mg Oral DAILY PRN   ??? bisacodyl (DULCOLAX) suppository 10 mg  10 mg Rectal DAILY PRN   ??? oxycodone-acetaminophen (PERCOCET) 5-325 mg per tablet 2 Tab  2 Tab Oral Q6H PRN   ??? morphine injection 2 mg  2 mg IntraVENous Q4H PRN   ??? glucose chewable tablet 12 g  12 g Oral PRN   ??? glucagon (GLUCAGEN) injection 1 mg  1 mg IntraMUSCular PRN   ??? famotidine (PEPCID) tablet 20 mg  20 mg Oral DAILY            Review of Systems  Pertinent items are noted in HPI.    Objective:     Vitals:  Blood pressure 131/78, pulse 78, temperature 98.4 ??F (36.9 ??C), resp. rate 18, height 5\' 6"  (1.676 m), weight 182 lb (82.555 kg), SpO2 97.00%.  Temp (24hrs), Avg:98.3 ??F (36.8 ??C), Min:97.7 ??F (36.5 ??C), Max:98.7 ??F (37.1 ??  C)      Intake and Output:     In: 954.4 [P.O.:720; I.V.:234.4]  Out: 201 [Urine:200]    Physical Exam:                Patient is not currently intubated    GEN: NAD   RS: CTA, negative for crackles   CVS: S1S2  ABD: Soft, Non Tender  GU:  No Foley   EXT: TRACE Edema  SKIN: No Rash   NEURO: Grossly focal      ECG: unchanged from previous tracings     Data Review        No results found for this basename: ITNL in the last 72 hours     No results found for this basename: CPK:3,CKMB:3,TROIQ:3, in the last 72 hours    Recent Labs   Basename 10/10/09 0414 10/09/09 0440 10/08/09 0536   ??? NA -- 137 136   ??? K -- 3.7 3.6   ??? CL -- 101 98   ??? CO2 -- 31 31   ??? BUN -- 26* 23*   ??? CREA -- 1.4* 1.6*   ??? GLU -- 181* 201*   ??? PHOS -- -- 4.2   ??? MG -- -- 1.6   ??? ALB -- -- --   ??? WBC 5.8 -- 5.6   ??? HGB 8.1* -- 8.4*   ??? HCT 25.2* -- 25.1*   ??? PLT 259 -- 209         Recent Labs   Basename 10/10/09 0414 10/09/09 0440 10/08/09 0536   ??? INR 1.4* 1.2* 1.2*   ??? PTP 13.2* 11.9* 11.7*   ??? APTT -- -- --        Needs: urine analysis, urine sodium, protein and creatinine  Lab Results   Component Value Date/Time    Creatinine,urine random 33.7 05/31/2009  7:20 PM             Discussed with:    Patient

## 2009-10-10 NOTE — Progress Notes (Signed)
Bedside and Verbal shift change report given to Avegail RN (oncoming nurse) by Jessica RN (offgoing nurse).  Report given with SBAR and Kardex.

## 2009-10-10 NOTE — Progress Notes (Signed)
[facility] Dosing Services: 10/10/09    Consult for Warfarin Dosing by Pharmacy by Dr. Selena Batten  Consult provided for this 69 y.o. year old male , for indication of PVD    Order entered for  Warfarin  9 (mg) at 1800 to be given today. Dose to achieve INR goal of  2-3    Significant drug interactions: levothyroxine,aspirin chronic medications  Previous dose given 9 mg   PT/INR Lab Results   Component Value Date/Time    INR 1.4 10/10/2009  4:14 AM        Platelets Lab Results   Component Value Date/Time    PLATELET 259 10/10/2009  4:14 AM        H/H Lab Results   Component Value Date/Time    HGB 8.1 10/10/2009  4:14 AM          Pharmacy to follow daily and will provide subsequent Warfarin dosing based on clinical status.  CANDICE D DUNCAN)  Contact information [phone]

## 2009-10-10 NOTE — Progress Notes (Signed)
Problem: Falls - Risk of  Goal: *Absence of falls  Call light within reach.  Low bed position.        Problem: Patient Education: Go to Patient Education Activity  Goal: Patient/Family Education  Uses call bell for assistance

## 2009-10-10 NOTE — Progress Notes (Signed)
[place] [facility]  [address] [place], [place] [address]  [phone]      Medical Progress Note      NAME: Travis [name]   DOB:  [birth year]  MRM:  [id number]    Date/Time: 10/10/2009  7:56 AM          Subjective:     Chief Complaint:  Chest pain: no further since admission    ROS:  (bold if positive, if negative)                     Tolerating Diet          Objective:       Vitals:          Last 24hrs VS reviewed since prior progress note. Most recent are:    Visit Vitals   Item Reading   ??? BP 131/78   ??? Pulse 78   ??? Temp 98.4 ??F (36.9 ??C)   ??? Resp 18   ??? Ht 5\' 6"    ??? Wt 182 lb   ??? BMI 29.38 kg/m2   ??? SpO2 97%         SpO2 Readings from Last 6 Encounters:   10/10/09 97%   10/10/09 97%   03/31/09 94%        O2 Flow Rate (L/min): 2 l/min     Intake/Output Summary (Last 24 hours) at 10/10/09 0756  Last data filed at 10/09/09 1957   Gross per 24 hour   Intake 849.68 ml   Output      1 ml   Net 848.68 ml            Exam:     Physical Exam:    Gen:  Well-developed, well-nourished, in no acute distress  HEENT:  Pink conjunctivae, PERRL, hearing intact to voice, moist mucous membranes  Neck:  Supple, without masses, thyroid non-tender  Resp:  No accessory muscle use, clear breath sounds without wheezes rales or rhonchi  Card:  No murmurs, normal S1, S2 without thrills, bruits; trace peripheral edema bilaterally  Abd:  Soft, non-tender, non-distended, normoactive bowel sounds are present, no palpable organomegaly and no detectable hernias  Lymph:  No cervical or inguinal adenopathy  Musc:  No cyanosis or clubbing  Skin:  No rashes or ulcers, skin turgor is good  Neuro:  Cranial nerves are grossly intact, no focal motor weakness, follows commands appropriately  Psych:  Good insight, oriented to person, place and time, alert      Medications Reviewed: (see below)    Lab Data Reviewed: (see below)    ______________________________________________________________________    Medications:      Current facility-administered medications   Medication Dose Route Frequency   ??? warfarin (COUMADIN) tablet 9 mg  9 mg Oral QPM   ??? insulin aspart (NOVOLOG) 100 unit/mL injection 15 Units  15 Units SubCUTAneous ONCE   ??? heparin 25,000 units in D5W 250 ml infusion  12-25 Units/kg/hr IntraVENous TITRATE   ??? bumetanide (BUMEX) tablet 1 mg  1 mg Oral DAILY   ??? insulin aspart (NOVOLOG) 100 unit/mL injection 4 Units  4 Units SubCUTAneous TIDAC   ??? metoprolol (LOPRESSOR) tablet 100 mg  100 mg Oral BID   ??? amlodipine (NORVASC) tablet 5 mg  5 mg Oral DAILY   ??? lisinopril (PRINIVIL, ZESTRIL) tablet 10 mg  10 mg Oral DAILY   ??? Warfarin: Dosing per Pharmacy   Other PRN   ???  insulin glargine (LANTUS) injection 20 Units  20 Units SubCUTAneous DAILY   ??? pantoprazole (PROTONIX) tablet 40 mg  40 mg Oral ACD   ??? epoetin alfa (EPOGEN;PROCRIT) injection 10,000 Units  10,000 Units SubCUTAneous Q7D   ??? aspirin chewable tablet 162 mg  162 mg Oral DAILY   ??? heparin (porcine) injection 4,000 Units  4,000 Units IntraVENous PRN   ??? heparin (porcine) injection 2,000 Units  2,000 Units IntraVENous PRN   ??? dextrose (D50W) injection Syrg 25 g  25 g IntraVENous PRN   ??? insulin aspart (NOVOLOG)   SubCUTAneous AC&HS   ??? clopidogrel (PLAVIX) tablet 75 mg  75 mg Oral DAILY   ??? levothyroxine (SYNTHROID) tablet 150 mcg  150 mcg Oral ACB   ??? nitroglycerin (NITROSTAT) tablet 0.4 mg  0.4 mg SubLINGual Q5MIN PRN   ??? fish oil-omega-3 fatty acids 340-1,000 mg capsule 2 Cap  2 Cap Oral BID   ??? rosuvastatin (CRESTOR) tablet 20 mg  20 mg Oral DAILY   ??? terazosin (HYTRIN) capsule 10 mg  10 mg Oral QHS   ??? docusate sodium (COLACE) capsule 100 mg  100 mg Oral BID   ??? bisacodyl (DULCOLAX) tablet 5 mg  5 mg Oral DAILY PRN   ??? bisacodyl (DULCOLAX) suppository 10 mg  10 mg Rectal DAILY PRN   ??? oxycodone-acetaminophen (PERCOCET) 5-325 mg per tablet 2 Tab  2 Tab Oral Q6H PRN   ??? morphine injection 2 mg  2 mg IntraVENous Q4H PRN    ??? glucose chewable tablet 12 g  12 g Oral PRN   ??? glucagon (GLUCAGEN) injection 1 mg  1 mg IntraMUSCular PRN   ??? famotidine (PEPCID) tablet 20 mg  20 mg Oral DAILY                Lab Review:     Recent Labs   Basename 10/10/09 0414 10/08/09 0536   ??? WBC 5.8 5.6   ??? HGB 8.1* 8.4*   ??? HCT 25.2* 25.1*   ??? PLT 259 209         Recent Labs   Basename 10/10/09 0414 10/09/09 0440 10/08/09 0536   ??? NA -- 137 136   ??? K -- 3.7 3.6   ??? CL -- 101 98   ??? CO2 -- 31 31   ??? GLU -- 181* 201*   ??? BUN -- 26* 23*   ??? CREA -- 1.4* 1.6*   ??? CA -- 8.6 8.7   ??? MG -- -- 1.6   ??? PHOS -- -- 4.2   ??? ALB -- -- --   ??? TBIL -- -- --   ??? SGOT -- -- --   ??? INR 1.4* 1.2* 1.2*         Lab Results   Component Value Date/Time    POC GLUCOSE 123 10/10/2009  7:28 AM    POC GLUCOSE 182 10/09/2009  8:36 PM    POC GLUCOSE 78 10/09/2009  5:19 PM    POC GLUCOSE 58 10/09/2009  4:54 PM    POC GLUCOSE 380 10/09/2009 11:53 AM           No results found for this basename: PH:3,PCO2:3,PO2:3,HCO3:3,FIO2:3 in the last 72 hours  Recent Labs   Basename 10/10/09 0414 10/09/09 0440 10/08/09 0536   ??? INR 1.4* 1.2* 1.2*              Assessment:     Patient Active Hospital Problem List:  *Coronary atherosclerosis of native  coronary artery (09/29/2009)    Chronic kidney disease, stage III (moderate) (09/29/2009)    Anemia, unspecified (09/29/2009)    Back pain (09/29/2009)    Acute myocardial infarction, unspecified site, initial episode of care (09/30/2009)    Diabetes (10/05/2009)    PVD (peripheral vascular disease) (10/06/2009)    Hyperlipidemia (10/06/2009)         Plan:     Patient Active Hospital Problem List:  Coronary atherosclerosis of native coronary artery (09/29/2009)   - per Cards no plans for further cath   - continue medical management    Chronic kidney disease, stage III (moderate) (09/29/2009)   - creat improved after holding Bumex   - resumed Bumex yesterday with once daily dosing    Anemia, unspecified (09/29/2009)   - Hgb stable, follow    Back pain (09/29/2009)     Acute myocardial infarction, unspecified site, initial episode of care (09/30/2009)   - as above    Diabetes (10/05/2009)   - appears to be getting too much insulin before meals   - will decrease meal time insulin to 4 units   - follow blood sugars    PVD (peripheral vascular disease) (10/06/2009)   - per Dr. Carmell Austria continue IV heparin until INR is therapeutic   - warfarin restarted, INR rising slowly, titrating warfarin dosing per protocol    Hyperlipidemia (10/06/2009)   - continue lipid lowering medications      Total time spent with patient: 35 minutes                  Care Plan discussed with: Patient and Nursing Staff    Discussed:  Care Plan and D/C Planning    Prophylaxis:  Coumadin and IV heparin    Disposition:  Home w/Family           ___________________________________________________    Attending Physician: Marijo File, MD

## 2009-10-11 LAB — GLUCOSE, POC
Glucose (POC): 157 mg/dL — ABNORMAL HIGH (ref 75–110)
Glucose (POC): 342 mg/dL — ABNORMAL HIGH (ref 75–110)
Glucose (POC): 346 mg/dL — ABNORMAL HIGH (ref 75–110)
Glucose (POC): 71 mg/dL — ABNORMAL LOW (ref 75–110)
Glucose (POC): 79 mg/dL (ref 75–110)
Glucose (POC): 98 mg/dL (ref 75–110)

## 2009-10-11 LAB — PROTHROMBIN TIME + INR
INR: 1.4 — ABNORMAL HIGH (ref 0.9–1.1)
Prothrombin time: 13.8 SECS — ABNORMAL HIGH (ref 9.0–11.0)

## 2009-10-11 LAB — PTT: aPTT: 54.1 s — ABNORMAL HIGH (ref 24.0–33.0)

## 2009-10-11 MED ORDER — WARFARIN 5 MG TAB
5 mg | Freq: Every evening | ORAL | Status: AC
Start: 2009-10-11 — End: 2009-10-11
  Administered 2009-10-11: 21:00:00 via ORAL

## 2009-10-11 MED FILL — PLAVIX 75 MG TABLET: 75 mg | ORAL | Qty: 1

## 2009-10-11 MED FILL — PANTOPRAZOLE 40 MG TAB, DELAYED RELEASE: 40 mg | ORAL | Qty: 1

## 2009-10-11 MED FILL — COUMADIN 5 MG TABLET: 5 mg | ORAL | Qty: 2

## 2009-10-11 MED FILL — INSULIN GLARGINE 100 UNIT/ML INJECTION: 100 unit/mL | SUBCUTANEOUS | Qty: 0.2

## 2009-10-11 MED FILL — FISH OIL 340 MG-1,000 MG CAPSULE: 340-1000 mg | ORAL | Qty: 2

## 2009-10-11 MED FILL — DOK 100 MG CAPSULE: 100 mg | ORAL | Qty: 1

## 2009-10-11 MED FILL — TERAZOSIN 5 MG CAP: 5 mg | ORAL | Qty: 2

## 2009-10-11 MED FILL — FAMOTIDINE 20 MG TAB: 20 mg | ORAL | Qty: 1

## 2009-10-11 MED FILL — AMLODIPINE 5 MG TAB: 5 mg | ORAL | Qty: 1

## 2009-10-11 MED FILL — INSULIN ASPART 100 UNIT/ML INJECTION: 100 unit/mL | SUBCUTANEOUS | Qty: 1

## 2009-10-11 MED FILL — CRESTOR 10 MG TABLET: 10 mg | ORAL | Qty: 2

## 2009-10-11 MED FILL — METOPROLOL TARTRATE 50 MG TAB: 50 mg | ORAL | Qty: 2

## 2009-10-11 MED FILL — HEPARIN (PORCINE) IN D5W 25,000 UNIT/250 ML IV: 25000 unit/250 mL(100 unit/mL) | INTRAVENOUS | Qty: 250

## 2009-10-11 MED FILL — LISINOPRIL 5 MG TAB: 5 mg | ORAL | Qty: 2

## 2009-10-11 MED FILL — ASPIRIN 81 MG CHEWABLE TAB: 81 mg | ORAL | Qty: 2

## 2009-10-11 MED FILL — BUMETANIDE 1 MG TAB: 1 mg | ORAL | Qty: 1

## 2009-10-11 MED FILL — COUMADIN 5 MG TABLET: 5 mg | ORAL | Qty: 1

## 2009-10-11 NOTE — Progress Notes (Signed)
[facility] Dosing Services: 10/11/09    Consult for Warfarin Dosing by Pharmacy by Dr. Selena Batten  Consult provided for this 70 y.o. year old male , for indication of PVD    Order entered for  Warfarin 11 (mg) at 1800 to be given today.  Increased previous dose by 25%. Dose to achieve INR goal of  2-3    Significant drug interactions: levothyroxine,aspirin chronic medications  Previous dose given 9 mg   PT/INR Lab Results   Component Value Date/Time    INR 1.4 10/11/09 03:50 AM        Platelets Lab Results   Component Value Date/Time    PLATELET 259 10/10/09 04:14 AM        H/H Lab Results   Component Value Date/Time    HGB 8.1 10/10/09 04:14 AM          Pharmacy to follow daily and will provide subsequent Warfarin dosing based on clinical status.  CANDICE D DUNCAN)  Contact information [phone]

## 2009-10-11 NOTE — Progress Notes (Signed)
[place] [facility]  [address] [place], [place] [address]  [phone]      Medical Progress Note      NAME: Travis [name]   DOB:  04-10-1941  MRM:  [id number]    Date/Time: 10/11/2009  7:56 AM          Subjective:     Chief Complaint:  Chest pain: no further since admission    ROS:  (bold if positive, if negative)                     Tolerating Diet          Objective:       Vitals:          Last 24hrs VS reviewed since prior progress note. Most recent are:    Visit Vitals   Item Reading   ??? BP 149/69   ??? Pulse 97   ??? Temp 98.3 ??F (36.8 ??C)   ??? Resp 19   ??? Ht 5\' 6"  (1.676 m)   ??? Wt 182 lb   ??? BMI 29.38 kg/m2   ??? SpO2 98%         SpO2 Readings from Last 6 Encounters:   10/11/09 98%   10/11/09 98%   03/31/09 94%        O2 Flow Rate (L/min): 2 l/min     Intake/Output Summary (Last 24 hours) at 10/11/09 0758  Last data filed at 10/11/09 2130   Gross per 24 hour   Intake  70.84 ml   Output      0 ml   Net  70.84 ml            Exam:     Physical Exam:    Gen:  Well-developed, well-nourished, in no acute distress  HEENT:  Pink conjunctivae, PERRL, hearing intact to voice, moist mucous membranes  Neck:  Supple, without masses, thyroid non-tender  Resp:  No accessory muscle use, clear breath sounds without wheezes rales or rhonchi  Card:  No murmurs, normal S1, S2 without thrills, bruits; trace peripheral edema bilaterally  Abd:  Soft, non-tender, non-distended, normoactive bowel sounds are present, no palpable organomegaly and no detectable hernias  Lymph:  No cervical or inguinal adenopathy  Musc:  No cyanosis or clubbing  Skin:  No rashes or ulcers, skin turgor is good  Neuro:  Cranial nerves are grossly intact, no focal motor weakness, follows commands appropriately  Psych:  Good insight, oriented to person, place and time, alert      Medications Reviewed: (see below)    Lab Data Reviewed: (see below)    ______________________________________________________________________    Medications:      Current facility-administered medications   Medication Dose Route Frequency   ??? warfarin (COUMADIN) tablet 9 mg  9 mg Oral QPM   ??? heparin 25,000 units in D5W 250 ml infusion  12-25 Units/kg/hr IntraVENous TITRATE   ??? bumetanide (BUMEX) tablet 1 mg  1 mg Oral DAILY   ??? insulin aspart (NOVOLOG) 100 unit/mL injection 4 Units  4 Units SubCUTAneous TIDAC   ??? metoprolol (LOPRESSOR) tablet 100 mg  100 mg Oral BID   ??? amlodipine (NORVASC) tablet 5 mg  5 mg Oral DAILY   ??? lisinopril (PRINIVIL, ZESTRIL) tablet 10 mg  10 mg Oral DAILY   ??? Warfarin: Dosing per Pharmacy   Other PRN   ??? insulin glargine (LANTUS) injection 20 Units  20 Units SubCUTAneous DAILY   ???  pantoprazole (PROTONIX) tablet 40 mg  40 mg Oral ACD   ??? epoetin alfa (EPOGEN;PROCRIT) injection 10,000 Units  10,000 Units SubCUTAneous Q7D   ??? aspirin chewable tablet 162 mg  162 mg Oral DAILY   ??? heparin (porcine) injection 4,000 Units  4,000 Units IntraVENous PRN   ??? heparin (porcine) injection 2,000 Units  2,000 Units IntraVENous PRN   ??? dextrose (D50W) injection Syrg 25 g  25 g IntraVENous PRN   ??? insulin aspart (NOVOLOG)   SubCUTAneous AC&HS   ??? clopidogrel (PLAVIX) tablet 75 mg  75 mg Oral DAILY   ??? levothyroxine (SYNTHROID) tablet 150 mcg  150 mcg Oral ACB   ??? nitroglycerin (NITROSTAT) tablet 0.4 mg  0.4 mg SubLINGual Q5MIN PRN   ??? fish oil-omega-3 fatty acids 340-1,000 mg capsule 2 Cap  2 Cap Oral BID   ??? rosuvastatin (CRESTOR) tablet 20 mg  20 mg Oral DAILY   ??? terazosin (HYTRIN) capsule 10 mg  10 mg Oral QHS   ??? docusate sodium (COLACE) capsule 100 mg  100 mg Oral BID   ??? bisacodyl (DULCOLAX) tablet 5 mg  5 mg Oral DAILY PRN   ??? bisacodyl (DULCOLAX) suppository 10 mg  10 mg Rectal DAILY PRN   ??? oxycodone-acetaminophen (PERCOCET) 5-325 mg per tablet 2 Tab  2 Tab Oral Q6H PRN   ??? morphine injection 2 mg  2 mg IntraVENous Q4H PRN   ??? glucose chewable tablet 12 g  12 g Oral PRN   ??? glucagon (GLUCAGEN) injection 1 mg  1 mg IntraMUSCular PRN    ??? famotidine (PEPCID) tablet 20 mg  20 mg Oral DAILY                Lab Review:     Recent Labs   [facility] 10/10/09 0414   ??? WBC 5.8   ??? HGB 8.1*   ??? HCT 25.2*   ??? PLT 259         Recent Labs   Basename 10/11/09 0350 10/10/09 0414 10/09/09 0440   ??? NA -- -- 137   ??? K -- -- 3.7   ??? CL -- -- 101   ??? CO2 -- -- 31   ??? GLU -- -- 181*   ??? BUN -- -- 26*   ??? CREA -- -- 1.4*   ??? CA -- -- 8.6   ??? MG -- -- --   ??? PHOS -- -- --   ??? ALB -- -- --   ??? TBIL -- -- --   ??? SGOT -- -- --   ??? INR 1.4* 1.4* 1.2*         Lab Results   Component Value Date/Time    POC GLUCOSE 157 10/10/09 08:02 PM    POC GLUCOSE 81 10/10/09 05:01 PM    POC GLUCOSE 316 10/10/09 11:38 AM    POC GLUCOSE 123 10/10/09 07:28 AM    POC GLUCOSE 182 10/09/09 08:36 PM           No results found for this basename: PH:3,PCO2:3,PO2:3,HCO3:3,FIO2:3 in the last 72 hours  Recent Labs   Basename 10/11/09 0350 10/10/09 0414 10/09/09 0440   ??? INR 1.4* 1.4* 1.2*              Assessment:     Patient Active Hospital Problem List:  *Coronary atherosclerosis of native coronary artery (09/29/2009)    Chronic kidney disease, stage III (moderate) (09/29/2009)    Anemia, unspecified (09/29/2009)    Back pain (  09/29/2009)    Acute myocardial infarction, unspecified site, initial episode of care (09/30/2009)    Diabetes (10/05/2009)    PVD (peripheral vascular disease) (10/06/2009)    Hyperlipidemia (10/06/2009)         Plan:     Patient Active Hospital Problem List:  Coronary atherosclerosis of native coronary artery (09/29/2009)   - per Cards no plans for further cath   - continue medical management    Chronic kidney disease, stage III (moderate) (09/29/2009)   - creat improved after holding Bumex   - resumed Bumex yesterday with once daily dosing    Anemia, unspecified (09/29/2009)   - Hgb stable, follow    Back pain (09/29/2009)    Acute myocardial infarction, unspecified site, initial episode of care (09/30/2009)   - as above    Diabetes (10/05/2009)   - appears to be getting too much insulin before meals    - will decrease meal time insulin to 4 units   - follow blood sugars    PVD (peripheral vascular disease) (10/06/2009)   - per Dr. Carmell Austria continue IV heparin until INR is therapeutic   - on warfarin, INR rising slowly, titrating warfarin dosing per protocol    Hyperlipidemia (10/06/2009)   - continue lipid lowering medications      Total time spent with patient: 35 minutes                  Care Plan discussed with: Patient and Nursing Staff    Discussed:  Care Plan and D/C Planning    Prophylaxis:  Coumadin and IV heparin    Disposition:  Home w/Family           ___________________________________________________    Attending Physician: Marijo File, MD

## 2009-10-11 NOTE — Progress Notes (Signed)
Bedside shift change report given to AVEGAIL G.RN (oncoming nurse) by Young Berry T.RN (offgoing nurse).  Report given with SBAR.

## 2009-10-11 NOTE — Progress Notes (Signed)
[facility] Progress Note    Assessment:     1.ZOX:WRUEAVWU.  2.CKD:2 to DM,HTN:Baseline cr 1.5-2.0 mg%     -cr at baseline  3.Hypotension:better   4.Proteinuria:2 to DM,on lisinopeil  5.Anemia:ok Iron panel    6.DM     Plan:     1.HTN,DM control  2.Epo out  patient        Subjective:     HPI: Patient seen.    Sleeping comfortably      Current facility-administered medications   Medication Dose Route Frequency   ??? warfarin (COUMADIN) tablet 9 mg  9 mg Oral QPM   ??? heparin 25,000 units in D5W 250 ml infusion  12-25 Units/kg/hr IntraVENous TITRATE   ??? bumetanide (BUMEX) tablet 1 mg  1 mg Oral DAILY   ??? insulin aspart (NOVOLOG) 100 unit/mL injection 4 Units  4 Units SubCUTAneous TIDAC   ??? metoprolol (LOPRESSOR) tablet 100 mg  100 mg Oral BID   ??? amlodipine (NORVASC) tablet 5 mg  5 mg Oral DAILY   ??? lisinopril (PRINIVIL, ZESTRIL) tablet 10 mg  10 mg Oral DAILY   ??? Warfarin: Dosing per Pharmacy   Other PRN   ??? insulin glargine (LANTUS) injection 20 Units  20 Units SubCUTAneous DAILY   ??? pantoprazole (PROTONIX) tablet 40 mg  40 mg Oral ACD   ??? epoetin alfa (EPOGEN;PROCRIT) injection 10,000 Units  10,000 Units SubCUTAneous Q7D   ??? aspirin chewable tablet 162 mg  162 mg Oral DAILY   ??? heparin (porcine) injection 4,000 Units  4,000 Units IntraVENous PRN   ??? heparin (porcine) injection 2,000 Units  2,000 Units IntraVENous PRN   ??? dextrose (D50W) injection Syrg 25 g  25 g IntraVENous PRN   ??? insulin aspart (NOVOLOG)   SubCUTAneous AC&HS   ??? clopidogrel (PLAVIX) tablet 75 mg  75 mg Oral DAILY   ??? levothyroxine (SYNTHROID) tablet 150 mcg  150 mcg Oral ACB   ??? nitroglycerin (NITROSTAT) tablet 0.4 mg  0.4 mg SubLINGual Q5MIN PRN   ??? fish oil-omega-3 fatty acids 340-1,000 mg capsule 2 Cap  2 Cap Oral BID   ??? rosuvastatin (CRESTOR) tablet 20 mg  20 mg Oral DAILY   ??? terazosin (HYTRIN) capsule 10 mg  10 mg Oral QHS   ??? docusate sodium (COLACE) capsule 100 mg  100 mg Oral BID    ??? bisacodyl (DULCOLAX) tablet 5 mg  5 mg Oral DAILY PRN   ??? bisacodyl (DULCOLAX) suppository 10 mg  10 mg Rectal DAILY PRN   ??? oxycodone-acetaminophen (PERCOCET) 5-325 mg per tablet 2 Tab  2 Tab Oral Q6H PRN   ??? morphine injection 2 mg  2 mg IntraVENous Q4H PRN   ??? glucose chewable tablet 12 g  12 g Oral PRN   ??? glucagon (GLUCAGEN) injection 1 mg  1 mg IntraMUSCular PRN   ??? famotidine (PEPCID) tablet 20 mg  20 mg Oral DAILY            Review of Systems  Pertinent items are noted in HPI.    Objective:     Vitals:  Blood pressure 149/69, pulse 97, temperature 98.3 ??F (36.8 ??C), resp. rate 19, height 5\' 6"  (1.676 m), weight 182 lb (82.555 kg), SpO2 98.00%.  Temp (24hrs), Avg:98.3 ??F (36.8 ??C), Min:98.2 ??F (36.8 ??C), Max:98.4 ??F (36.9 ??C)      Intake and Output:     In: 200.5 [I.V.:200.5]  Out: -     Physical Exam:  Patient is not currently intubated    GEN: NAD   RS: CTA, negative for crackles   CVS: S1S2  ABD: Soft, Non Tender  GU:  No Foley   EXT: TRACE Edema  SKIN: No Rash   NEURO: Grossly focal      ECG: unchanged from previous tracings     Data Review        No results found for this basename: ITNL in the last 72 hours     No results found for this basename: CPK:3,CKMB:3,TROIQ:3, in the last 72 hours    Recent Labs   Basename 10/10/09 0414 10/09/09 0440   ??? NA -- 137   ??? K -- 3.7   ??? CL -- 101   ??? CO2 -- 31   ??? BUN -- 26*   ??? CREA -- 1.4*   ??? GLU -- 181*   ??? PHOS -- --   ??? MG -- --   ??? ALB -- --   ??? WBC 5.8 --   ??? HGB 8.1* --   ??? HCT 25.2* --   ??? PLT 259 --         Recent Labs   Basename 10/11/09 0350 10/10/09 0414 10/09/09 0440   ??? INR 1.4* 1.4* 1.2*   ??? PTP 13.8* 13.2* 11.9*   ??? APTT -- -- --       Needs: urine analysis, urine sodium, protein and creatinine  Lab Results   Component Value Date/Time    Creatinine,urine random 33.7 05/31/09 07:20 PM             Discussed with:    Patient

## 2009-10-12 LAB — CBC W/O DIFF
HCT: 25.4 % — ABNORMAL LOW (ref 36.6–50.3)
HGB: 8.1 g/dL — ABNORMAL LOW (ref 12.1–17.0)
MCH: 28.9 PG (ref 26.0–34.0)
MCHC: 31.9 g/dL (ref 30.0–36.5)
MCV: 90.7 FL (ref 80.0–99.0)
PLATELET: 251 10*3/uL (ref 150–400)
RBC: 2.8 M/uL — ABNORMAL LOW (ref 4.10–5.70)
RDW: 17.3 % — ABNORMAL HIGH (ref 11.5–14.5)
WBC: 6.1 10*3/uL (ref 4.1–11.1)

## 2009-10-12 LAB — GLUCOSE, POC
Glucose (POC): 298 mg/dL — ABNORMAL HIGH (ref 75–110)
Glucose (POC): 325 mg/dL — ABNORMAL HIGH (ref 75–110)
Glucose (POC): 250 mg/dL — ABNORMAL HIGH (ref 75–110)
Glucose (POC): 133 mg/dL — ABNORMAL HIGH (ref 75–110)
Glucose (POC): 54 mg/dL — ABNORMAL LOW (ref 75–110)
Glucose (POC): 76 mg/dL (ref 75–110)

## 2009-10-12 LAB — TROPONIN I
Troponin-I, Qt.: 0.17 ng/mL — ABNORMAL HIGH (ref <0.05)
Troponin-I, Qt.: 2.81 ng/mL — ABNORMAL HIGH (ref <0.05)

## 2009-10-12 LAB — PROTHROMBIN TIME + INR
INR: 1.5 — ABNORMAL HIGH (ref 0.9–1.1)
INR: 1.6 — ABNORMAL HIGH (ref 0.9–1.1)
Prothrombin time: 14.6 SECS — ABNORMAL HIGH (ref 9.0–11.0)
Prothrombin time: 15.3 SECS — ABNORMAL HIGH (ref 9.0–11.0)

## 2009-10-12 LAB — PTT
aPTT: 66.2 s — ABNORMAL HIGH (ref 24.0–33.0)
aPTT: 74.1 s — ABNORMAL HIGH (ref 24.0–33.0)

## 2009-10-12 LAB — CK W/ CKMB & INDEX
CK - MB: 10.3 NG/ML — ABNORMAL HIGH (ref 0.5–3.6)
CK-MB Index: 7.9 — ABNORMAL HIGH (ref 0–2.5)
CK: 131 U/L (ref 39–308)

## 2009-10-12 LAB — CK: CK: 181 U/L (ref 39–308)

## 2009-10-12 MED ORDER — ACETYLCYSTEINE 20 % (200 MG/ML) SOLN
200 mg/mL (20 %) | Freq: Two times a day (BID) | Status: AC
Start: 2009-10-12 — End: 2009-10-14
  Administered 2009-10-12 – 2009-10-14 (×4): via ORAL

## 2009-10-12 MED ORDER — INSULIN NPH HUMAN RECOMB 100 UNIT/ML INJECTION
100 unit/mL | Freq: Every day | SUBCUTANEOUS | Status: DC
Start: 2009-10-12 — End: 2009-10-13
  Administered 2009-10-13: 12:00:00 via SUBCUTANEOUS

## 2009-10-12 MED ORDER — WARFARIN 5 MG TAB
5 mg | Freq: Every evening | ORAL | Status: DC
Start: 2009-10-12 — End: 2009-10-12

## 2009-10-12 MED ORDER — INSULIN NPH HUMAN RECOMB 100 UNIT/ML INJECTION
100 unit/mL | Freq: Every evening | SUBCUTANEOUS | Status: DC
Start: 2009-10-12 — End: 2009-10-13
  Administered 2009-10-13: 02:00:00 via SUBCUTANEOUS

## 2009-10-12 MED ORDER — AMLODIPINE 5 MG TAB
5 mg | Freq: Once | ORAL | Status: AC
Start: 2009-10-12 — End: 2009-10-12
  Administered 2009-10-12: 19:00:00 via ORAL

## 2009-10-12 MED ORDER — AMLODIPINE 5 MG TAB
5 mg | Freq: Every day | ORAL | Status: DC
Start: 2009-10-12 — End: 2009-10-15
  Administered 2009-10-13 – 2009-10-15 (×3): via ORAL

## 2009-10-12 MED FILL — INSULIN ASPART 100 UNIT/ML INJECTION: 100 unit/mL | SUBCUTANEOUS | Qty: 1

## 2009-10-12 MED FILL — BUMETANIDE 1 MG TAB: 1 mg | ORAL | Qty: 1

## 2009-10-12 MED FILL — FISH OIL 340 MG-1,000 MG CAPSULE: 340-1000 mg | ORAL | Qty: 2

## 2009-10-12 MED FILL — ASPIRIN 81 MG CHEWABLE TAB: 81 mg | ORAL | Qty: 2

## 2009-10-12 MED FILL — DOK 100 MG CAPSULE: 100 mg | ORAL | Qty: 1

## 2009-10-12 MED FILL — METOPROLOL TARTRATE 50 MG TAB: 50 mg | ORAL | Qty: 2

## 2009-10-12 MED FILL — LISINOPRIL 5 MG TAB: 5 mg | ORAL | Qty: 2

## 2009-10-12 MED FILL — NITROSTAT 0.4 MG SUBLINGUAL TABLET: 0.4 mg | SUBLINGUAL | Qty: 1

## 2009-10-12 MED FILL — AMLODIPINE 5 MG TAB: 5 mg | ORAL | Qty: 1

## 2009-10-12 MED FILL — CRESTOR 10 MG TABLET: 10 mg | ORAL | Qty: 2

## 2009-10-12 MED FILL — INSULIN GLARGINE 100 UNIT/ML INJECTION: 100 unit/mL | SUBCUTANEOUS | Qty: 0.2

## 2009-10-12 MED FILL — HEPARIN (PORCINE) IN D5W 25,000 UNIT/250 ML IV: 25000 unit/250 mL(100 unit/mL) | INTRAVENOUS | Qty: 250

## 2009-10-12 MED FILL — PANTOPRAZOLE 40 MG TAB, DELAYED RELEASE: 40 mg | ORAL | Qty: 1

## 2009-10-12 MED FILL — LEVOTHYROXINE 150 MCG TAB: 150 mcg | ORAL | Qty: 1

## 2009-10-12 MED FILL — ACETYLCYSTEINE 20 % (200 MG/ML) SOLN: 200 mg/mL (20 %) | Qty: 30

## 2009-10-12 MED FILL — FAMOTIDINE 20 MG TAB: 20 mg | ORAL | Qty: 1

## 2009-10-12 MED FILL — PLAVIX 75 MG TABLET: 75 mg | ORAL | Qty: 1

## 2009-10-12 MED FILL — TERAZOSIN 5 MG CAP: 5 mg | ORAL | Qty: 2

## 2009-10-12 NOTE — Progress Notes (Signed)
[facility] Progress Note    Assessment:     1.ZOX:WRUEAVWU.  2.CKD:2 to DM,HTN:Baseline cr 1.5-2.0 mg%     -cr at baseline  3.Hypotension:better   4.Proteinuria:2 to DM,on lisinopeil  5.Anemia:ok Iron panel    6.DM     Plan:     1.HTN,DM control  2.Epo out  patient        Subjective:     HPI: Patient seen.    Sleeping comfortably      Current facility-administered medications   Medication Dose Route Frequency   ??? warfarin (COUMADIN) tablet 11 mg  11 mg Oral QPM   ??? heparin 25,000 units in D5W 250 ml infusion  12-25 Units/kg/hr IntraVENous TITRATE   ??? bumetanide (BUMEX) tablet 1 mg  1 mg Oral DAILY   ??? insulin aspart (NOVOLOG) 100 unit/mL injection 4 Units  4 Units SubCUTAneous TIDAC   ??? metoprolol (LOPRESSOR) tablet 100 mg  100 mg Oral BID   ??? amlodipine (NORVASC) tablet 5 mg  5 mg Oral DAILY   ??? lisinopril (PRINIVIL, ZESTRIL) tablet 10 mg  10 mg Oral DAILY   ??? Warfarin: Dosing per Pharmacy   Other PRN   ??? insulin glargine (LANTUS) injection 20 Units  20 Units SubCUTAneous DAILY   ??? pantoprazole (PROTONIX) tablet 40 mg  40 mg Oral ACD   ??? epoetin alfa (EPOGEN;PROCRIT) injection 10,000 Units  10,000 Units SubCUTAneous Q7D   ??? aspirin chewable tablet 162 mg  162 mg Oral DAILY   ??? heparin (porcine) injection 4,000 Units  4,000 Units IntraVENous PRN   ??? heparin (porcine) injection 2,000 Units  2,000 Units IntraVENous PRN   ??? dextrose (D50W) injection Syrg 25 g  25 g IntraVENous PRN   ??? insulin aspart (NOVOLOG)   SubCUTAneous AC&HS   ??? clopidogrel (PLAVIX) tablet 75 mg  75 mg Oral DAILY   ??? levothyroxine (SYNTHROID) tablet 150 mcg  150 mcg Oral ACB   ??? nitroglycerin (NITROSTAT) tablet 0.4 mg  0.4 mg SubLINGual Q5MIN PRN   ??? fish oil-omega-3 fatty acids 340-1,000 mg capsule 2 Cap  2 Cap Oral BID   ??? rosuvastatin (CRESTOR) tablet 20 mg  20 mg Oral DAILY   ??? terazosin (HYTRIN) capsule 10 mg  10 mg Oral QHS   ??? docusate sodium (COLACE) capsule 100 mg  100 mg Oral BID    ??? bisacodyl (DULCOLAX) tablet 5 mg  5 mg Oral DAILY PRN   ??? bisacodyl (DULCOLAX) suppository 10 mg  10 mg Rectal DAILY PRN   ??? oxycodone-acetaminophen (PERCOCET) 5-325 mg per tablet 2 Tab  2 Tab Oral Q6H PRN   ??? morphine injection 2 mg  2 mg IntraVENous Q4H PRN   ??? glucose chewable tablet 12 g  12 g Oral PRN   ??? glucagon (GLUCAGEN) injection 1 mg  1 mg IntraMUSCular PRN   ??? famotidine (PEPCID) tablet 20 mg  20 mg Oral DAILY            Review of Systems  Pertinent items are noted in HPI.    Objective:     Vitals:  Blood pressure 153/63, pulse 77, temperature 98.1 ??F (36.7 ??C), resp. rate 18, height 5\' 6"  (1.676 m), weight 182 lb (82.555 kg), SpO2 98.00%.  Temp (24hrs), Avg:98.6 ??F (37 ??C), Min:98 ??F (36.7 ??C), Max:99.8 ??F (37.7 ??C)      Intake and Output:     In: 552.4 [P.O.:450; I.V.:102.4]  Out: -     Physical Exam:  Patient is not currently intubated    GEN: NAD   RS: CTA, negative for crackles   CVS: S1S2  ABD: Soft, Non Tender  GU:  No Foley   EXT: TRACE Edema  SKIN: No Rash   NEURO: Grossly focal      ECG: unchanged from previous tracings     Data Review        No results found for this basename: ITNL in the last 72 hours     No results found for this basename: CPK:3,CKMB:3,TROIQ:3, in the last 72 hours    Recent Labs   Basename 10/12/09 0407 10/10/09 0414   ??? NA -- --   ??? K -- --   ??? CL -- --   ??? CO2 -- --   ??? BUN -- --   ??? CREA -- --   ??? GLU -- --   ??? PHOS -- --   ??? MG -- --   ??? ALB -- --   ??? WBC 6.1 5.8   ??? HGB 8.1* 8.1*   ??? HCT 25.4* 25.2*   ??? PLT 251 259         Recent Labs   Basename 10/12/09 0407 10/11/09 2357 10/11/09 0350   ??? INR 1.6* 1.5* 1.4*   ??? PTP 15.3* 14.6* 13.8*   ??? APTT -- -- --       Needs: urine analysis, urine sodium, protein and creatinine  Lab Results   Component Value Date/Time    Creatinine,urine random 33.7 05/31/09 07:20 PM             Discussed with:    Patient

## 2009-10-12 NOTE — Discharge Summary (Signed)
Physician Discharge Summary     Patient ID:  Travis [name]  [id number]  69 y.o.  Sep 29, 1940    Admit date: 09/29/2009    Discharge date and time: 10/12/2009    Admission Diagnoses: DKA (DIABETIC KETOACIDOSIS)  Iron Def. Anemia    Discharge Diagnoses:  Principal Diagnosis Coronary atherosclerosis of native coronary artery                                            Patient Active Hospital Problem List:  *Coronary atherosclerosis of native coronary artery (09/29/2009)    Chronic kidney disease, stage III (moderate) (09/29/2009)    Anemia, unspecified (09/29/2009)    Back pain (09/29/2009)    Acute myocardial infarction, unspecified site, initial episode of care (09/30/2009)    Diabetes (10/05/2009)    PVD (peripheral vascular disease) (10/06/2009)    Hyperlipidemia (10/06/2009)         Hospital Course: This is an interim summary and covers the hospitalization from 10/06/2009 to 10/12/2009.  For the preceding portion of the patient's hospitalization, please see my partner's notes.    By problem:    (1)  DM - his insulin regimen was changed to twice daily NPH as his blood sugars were dropping in the afternoons but remained elevated in the late evening and morning from early peaking Lantus    (2)  PVD - Dr. Carmell Austria recommended that he be anticoagulated with IV heparin until his INR was therapeutic.  His INR has been rising but only very slowly.  His warfarin dose has been titrated up to 11 mg as of 10/11/2009.    (3)  CAD - Cardiology recommended medical management.  [name] had recurrent chest pain on 10/12/2009 and will be reevaluated by Cardiology.    (4)  CKD 3 - creatinine was essentially stable.  It did trend up just slighly but his diuretics were changed to oral and once daily and his creatinine remained in his usual range.      Final discharge summary will be dictated by the discharging physician.      Signed:  Marijo File, MD  10/12/2009  11:39 AM

## 2009-10-12 NOTE — Progress Notes (Signed)
Spoke with Thayer Ohm Dr Welton Flakes nurse, okay to have Stress test while cardiac enzymes have shown elevation due to the fact that it is a resting test at this point.

## 2009-10-12 NOTE — Progress Notes (Signed)
DTC Progress Note    Recommendations/ Comments: BG's vary widely from 71 to 346 in the same day. Noted that one factor that contributed to this yesterday was the fact that he was not given the meal-time Novolog 4 units due to a BG of 71 prior to the meal. The BG was corrected to 98 but the [facility] Novolog was not given. This resulted in a BG of 298 later. Reminder to nurses to correct BG to > 80; then give patient meal and coverage for the food. Continue to observe.      Chart reviewed on Travis [name].    Patient is a 69 y.o. male with DM on Lantus 30 units and Novolog scale at home.    A1c:   Lab Results   Component Value Date/Time    Hemoglobin A1C 8.7 06/22/09 04:55 AM         POC Glucose last 24hrs:   Lab Results   Component Value Date/Time    POC GLUCOSE 325 10/12/09 07:29 AM    POC GLUCOSE 298 10/11/09 09:16 PM    POC GLUCOSE 98 10/11/09 05:31 PM    POC GLUCOSE 79 10/11/09 05:10 PM    POC GLUCOSE 71 10/11/09 04:53 PM    POC GLUCOSE 346 10/11/09 12:04 PM    POC GLUCOSE 342 10/11/09 07:52 AM    POC GLUCOSE 157 10/10/09 08:02 PM           Lab Results   Component Value Date/Time    Creatinine 1.4 10/09/09 04:40 AM       Current hospital DM medications: Lantus 20 units, Novolog 4 units AC and Novolog s.scale.     Will continue to follow as needed.    Thank you.  Elenor Quinones RN, CDE

## 2009-10-12 NOTE — Progress Notes (Addendum)
[place] [facility]  [address] [place], [place] [address]  [phone]      Medical Progress Note      NAME: Travis [name]   DOB:  Jan 22, 1941  MRM:  [id number]    Date/Time: 10/12/2009  9:10 AM           Subjective:     Chief Complaint:  Chest pain: no further since admission    ROS:  (bold if positive, if negative)                     Tolerating Diet          Objective:       Vitals:          Last 24hrs VS reviewed since prior progress note. Most recent are:    Visit Vitals   Item Reading   ??? BP 153/63   ??? Pulse 77   ??? Temp 98.1 ??F (36.7 ??C)   ??? Resp 18   ??? Ht 5\' 6"  (1.676 m)   ??? Wt 182 lb   ??? BMI 29.38 kg/m2   ??? SpO2 98%         SpO2 Readings from Last 6 Encounters:   10/12/09 98%   10/12/09 98%   03/31/09 94%        O2 Flow Rate (L/min): 2 l/min     Intake/Output Summary (Last 24 hours) at 10/12/09 0910  Last data filed at 10/12/09 4696   Gross per 24 hour   Intake  481.6 ml   Output      0 ml   Net  481.6 ml            Exam:     Physical Exam:    Gen:  Well-developed, well-nourished, in no acute distress  HEENT:  Pink conjunctivae, PERRL, hearing intact to voice, moist mucous membranes  Neck:  Supple, without masses, thyroid non-tender  Resp:  No accessory muscle use, clear breath sounds without wheezes rales or rhonchi  Card:  No murmurs, normal S1, S2 without thrills, bruits; trace peripheral edema bilaterally  Abd:  Soft, non-tender, non-distended, normoactive bowel sounds are present, no palpable organomegaly and no detectable hernias  Lymph:  No cervical or inguinal adenopathy  Musc:  No cyanosis or clubbing  Skin:  No rashes or ulcers, skin turgor is good  Neuro:  Cranial nerves are grossly intact, no focal motor weakness, follows commands appropriately  Psych:  Good insight, oriented to person, place and time, alert      Medications Reviewed: (see below)    Lab Data Reviewed: (see below)    ______________________________________________________________________     Medications:     Current facility-administered medications   Medication Dose Route Frequency   ??? warfarin (COUMADIN) tablet 11 mg  11 mg Oral QPM   ??? heparin 25,000 units in D5W 250 ml infusion  12-25 Units/kg/hr IntraVENous TITRATE   ??? bumetanide (BUMEX) tablet 1 mg  1 mg Oral DAILY   ??? insulin aspart (NOVOLOG) 100 unit/mL injection 4 Units  4 Units SubCUTAneous TIDAC   ??? metoprolol (LOPRESSOR) tablet 100 mg  100 mg Oral BID   ??? amlodipine (NORVASC) tablet 5 mg  5 mg Oral DAILY   ??? lisinopril (PRINIVIL, ZESTRIL) tablet 10 mg  10 mg Oral DAILY   ??? Warfarin: Dosing per Pharmacy   Other PRN   ??? insulin glargine (LANTUS) injection 20 Units  20 Units SubCUTAneous DAILY   ???  pantoprazole (PROTONIX) tablet 40 mg  40 mg Oral ACD   ??? epoetin alfa (EPOGEN;PROCRIT) injection 10,000 Units  10,000 Units SubCUTAneous Q7D   ??? aspirin chewable tablet 162 mg  162 mg Oral DAILY   ??? heparin (porcine) injection 4,000 Units  4,000 Units IntraVENous PRN   ??? heparin (porcine) injection 2,000 Units  2,000 Units IntraVENous PRN   ??? dextrose (D50W) injection Syrg 25 g  25 g IntraVENous PRN   ??? insulin aspart (NOVOLOG)   SubCUTAneous AC&HS   ??? clopidogrel (PLAVIX) tablet 75 mg  75 mg Oral DAILY   ??? levothyroxine (SYNTHROID) tablet 150 mcg  150 mcg Oral ACB   ??? nitroglycerin (NITROSTAT) tablet 0.4 mg  0.4 mg SubLINGual Q5MIN PRN   ??? fish oil-omega-3 fatty acids 340-1,000 mg capsule 2 Cap  2 Cap Oral BID   ??? rosuvastatin (CRESTOR) tablet 20 mg  20 mg Oral DAILY   ??? terazosin (HYTRIN) capsule 10 mg  10 mg Oral QHS   ??? docusate sodium (COLACE) capsule 100 mg  100 mg Oral BID   ??? bisacodyl (DULCOLAX) tablet 5 mg  5 mg Oral DAILY PRN   ??? bisacodyl (DULCOLAX) suppository 10 mg  10 mg Rectal DAILY PRN   ??? oxycodone-acetaminophen (PERCOCET) 5-325 mg per tablet 2 Tab  2 Tab Oral Q6H PRN   ??? morphine injection 2 mg  2 mg IntraVENous Q4H PRN   ??? glucose chewable tablet 12 g  12 g Oral PRN   ??? glucagon (GLUCAGEN) injection 1 mg  1 mg IntraMUSCular PRN    ??? famotidine (PEPCID) tablet 20 mg  20 mg Oral DAILY                Lab Review:     Recent Labs   Basename 10/12/09 0407 10/10/09 0414   ??? WBC 6.1 5.8   ??? HGB 8.1* 8.1*   ??? HCT 25.4* 25.2*   ??? PLT 251 259         Recent Labs   Basename 10/12/09 0407 10/11/09 2357 10/11/09 0350   ??? NA -- -- --   ??? K -- -- --   ??? CL -- -- --   ??? CO2 -- -- --   ??? GLU -- -- --   ??? BUN -- -- --   ??? CREA -- -- --   ??? CA -- -- --   ??? MG -- -- --   ??? PHOS -- -- --   ??? ALB -- -- --   ??? TBIL -- -- --   ??? SGOT -- -- --   ??? INR 1.6* 1.5* 1.4*         Lab Results   Component Value Date/Time    POC GLUCOSE 325 10/12/09 07:29 AM    POC GLUCOSE 298 10/11/09 09:16 PM    POC GLUCOSE 98 10/11/09 05:31 PM    POC GLUCOSE 79 10/11/09 05:10 PM    POC GLUCOSE 71 10/11/09 04:53 PM           No results found for this basename: PH:3,PCO2:3,PO2:3,HCO3:3,FIO2:3 in the last 72 hours  Recent Labs   Basename 10/12/09 0407 10/11/09 2357 10/11/09 0350   ??? INR 1.6* 1.5* 1.4*              Assessment:     Patient Active Hospital Problem List:  *Coronary atherosclerosis of native coronary artery (09/29/2009)    Chronic kidney disease, stage III (moderate) (09/29/2009)    Anemia, unspecified (  09/29/2009)    Back pain (09/29/2009)    Acute myocardial infarction, unspecified site, initial episode of care (09/30/2009)    Diabetes (10/05/2009)    PVD (peripheral vascular disease) (10/06/2009)    Hyperlipidemia (10/06/2009)         Plan:     Patient Active Hospital Problem List:  Coronary atherosclerosis of native coronary artery (09/29/2009)   - per Cards no plans for further cath   - continue medical management    Chronic kidney disease, stage III (moderate) (09/29/2009)   - creat improved after holding Bumex   - resumed Bumex with once daily dosing    Anemia, unspecified (09/29/2009)   - Hgb stable, follow    Back pain (09/29/2009)    Acute myocardial infarction, unspecified site, initial episode of care (09/30/2009)   - as above    Diabetes (10/05/2009)   - blood sugar dropping throughout day    - Lantus appears to be peaking early   - will change to NPH bid and follow blood sugars   - needs more overall insulin but with different timing    PVD (peripheral vascular disease) (10/06/2009)   - per Dr. Carmell Austria continue IV heparin until INR is therapeutic   - on warfarin, INR continues rising slowly, titrating warfarin dosing per protocol    Hyperlipidemia (10/06/2009)   - continue lipid lowering medications      Total time spent with patient: 35 minutes                  Care Plan discussed with: Patient and Nursing Staff    Discussed:  Care Plan and D/C Planning    Prophylaxis:  Coumadin and IV heparin    Disposition:  Home w/Family           ___________________________________________________    Attending Physician: Marijo File, MD

## 2009-10-12 NOTE — Progress Notes (Signed)
S: Mr. Tetzloff had chest pain starting this morning at 5:30 AM.  It was mild and he hoped that it would go away and did not tell anyone.  Later in the day, his symptoms became more severe, which eventually resolved with 2 SL NTG.  Symptoms were similar to his anginal symptoms but not as severe.      Physical Exam:  BP 155/63   Pulse 110   Temp 98.1 ??F (36.7 ??C)   Resp 18   Ht 5\' 6"  (1.676 m)   Wt 182 lb (82.555 kg)   BMI 29.38 kg/m2   SpO2 98%   Patient appears generally well, mood and affect are appropriate and pleasant.   HEENT: Normocephalic, atraumatic.   Neck Exam: Supple,   Lung Exam: Clear to auscultation, even breath sounds.   Cardiac Exam: Regular rate and rhythm with 1-2/6 systolic murmur.   Abdomen: Soft, non-tender, non-distended, normal bowel sounds.   Extremities: mild lower extremity edema.   Vasc - poor DP bilat    Recent Results (from the past 24 hour(s))   PTT    Collection Time    10/11/09  4:00 PM   Component Value Range   ??? aPTT 54.1 (*) 24.0 - 33.0 (sec)   GLUCOSE, POC    Collection Time    10/11/09  4:53 PM   Component Value Range   ??? POC GLUCOSE 71 (*) 75 - 110 (mg/dL)   GLUCOSE, POC    Collection Time    10/11/09  5:10 PM   Component Value Range   ??? POC GLUCOSE 79  75 - 110 (mg/dL)   GLUCOSE, POC    Collection Time    10/11/09  5:31 PM   Component Value Range   ??? POC GLUCOSE 98  75 - 110 (mg/dL)   GLUCOSE, POC    Collection Time    10/11/09  9:16 PM   Component Value Range   ??? POC GLUCOSE 298 (*) 75 - 110 (mg/dL)   PROTHROMBIN TIME    Collection Time    10/11/09 11:57 PM   Component Value Range   ??? INR 1.5 (*) 0.9 - 1.1 ( )   ??? Prothrombin Time-PT 14.6 (*) 9.0 - 11.0 (SECS)   PTT    Collection Time    10/11/09 11:57 PM   Component Value Range   ??? aPTT 66.2 (*) 24.0 - 33.0 (sec)   CBC W/O DIFF    Collection Time    10/12/09  4:07 AM   Component Value Range   ??? WBC 6.1  4.1 - 11.1 (K/uL)   ??? RBC 2.80 (*) 4.10 - 5.70 (M/uL)   ??? HGB 8.1 (*) 12.1 - 17.0 (g/dL)   ??? HCT 25.4 (*) 36.6 - 50.3 (%)    ??? MCV 90.7  80.0 - 99.0 (FL)   ??? MCH 28.9  26.0 - 34.0 (PG)   ??? MCHC 31.9  30.0 - 36.5 (g/dL)   ??? RDW 17.3 (*) 11.5 - 14.5 (%)   ??? PLATELET 251  150 - 400 (K/uL)   PROTHROMBIN TIME    Collection Time    10/12/09  4:07 AM   Component Value Range   ??? INR 1.6 (*) 0.9 - 1.1 ( )   ??? Prothrombin Time-PT 15.3 (*) 9.0 - 11.0 (SECS)   PTT    Collection Time    10/12/09  6:30 AM   Component Value Range   ??? aPTT 74.1 (*) 24.0 - 33.0 (sec)  GLUCOSE, POC    Collection Time    10/12/09  7:29 AM   Component Value Range   ??? POC GLUCOSE 325 (*) 75 - 110 (mg/dL)   TROPONIN I    Collection Time    10/12/09 10:28 AM   Component Value Range   ??? Troponin-I, Qt. 0.17 (*) <0.05 (ng/mL)   CK W/ CKMB & INDEX    Collection Time    10/12/09 10:28 AM   Component Value Range   ??? CK 131  39 - 308 (U/L)   ??? CK - MB 10.3 (*) 0.5 - 3.6 (NG/ML)   ??? CK-MB Index 7.9 (*) 0 - 2.5 ( )   GLUCOSE, POC    Collection Time    10/12/09 12:06 PM   Component Value Range   ??? POC GLUCOSE 250 (*) 75 - 110 (mg/dL)       EKG - AV paced    Labs reviewed    Assessment and Plan:  Chest pain   - suspect this was anginal   - will transfer to telemetry and check serial cardiac markers.   - The problem with his recent recurrent MI's has likely involved the LCX territory, which is not been amenable to PCI.  He also is at high risk of vascular complications.    - To risk assess will check an adenosine myoview.  If he rules in for a MI or he has ischemia in territories other than the LCX, then consider repeat Cath.   - Will increase Norvasc and consider trial Ranexa      Tharon Aquas, MD, [facility]

## 2009-10-12 NOTE — Progress Notes (Signed)
10/11/09 2357 PTT   66.2 SEC. PER HEPARIN PROTOCOL THERAPEUTIC NO CHANGE IN RATE.NEXT PTT LAB.DRAW @0557 .

## 2009-10-12 NOTE — Progress Notes (Signed)
[facility] Dosing Services: 10/12/09    Consult for Warfarin Dosing by Pharmacy by Dr. Selena Batten  Consult provided for this 69 y.o. year old male , for indication of PVD    Order entered for  Warfarin 12 (mg) at 1800 to be given today.  Dose to achieve INR goal of  2-3    Significant drug interactions: levothyroxine,aspirin chronic medications  Previous dose given 11 mg   PT/INR Lab Results   Component Value Date/Time    INR 1.6 10/12/09 04:07 AM        Platelets Lab Results   Component Value Date/Time    PLATELET 251 10/12/09 04:07 AM        H/H Lab Results   Component Value Date/Time    HGB 8.1 10/12/09 04:07 AM          Pharmacy to follow daily and will provide subsequent Warfarin dosing based on clinical status.  CANDICE D DUNCAN)  Contact information [phone]

## 2009-10-12 NOTE — Progress Notes (Signed)
Dr. Welton Flakes has given order to hold coumadin tonight.

## 2009-10-12 NOTE — Progress Notes (Signed)
Bedside shift change report given to Cheryl RN (oncoming nurse) by Gwen RN (offgoing nurse).  Report given with SBAR.

## 2009-10-12 NOTE — Progress Notes (Signed)
TRANSFER - OUT REPORT:    Verbal report given to [facility] RN on Travis [name]  being transferred to [facility] for urgent transfer       Report consisted of patient???s Situation, Background, Assessment and   Recommendations(SBAR).     Information from the following report(s) SBAR and Kardex was reviewed with the receiving nurse.    Opportunity for questions and clarification was provided.

## 2009-10-12 NOTE — Progress Notes (Signed)
Trop elevated indicating another NSTEMI (CP earlier today)  Will plan for LHC through radial access tomorrow afternoon (stop coumadin, hold bumex).

## 2009-10-13 LAB — CBC W/O DIFF
HCT: 26.3 % — ABNORMAL LOW (ref 36.6–50.3)
HGB: 8.7 g/dL — ABNORMAL LOW (ref 12.1–17.0)
MCH: 29.2 PG (ref 26.0–34.0)
MCHC: 33.1 g/dL (ref 30.0–36.5)
MCV: 88.3 FL (ref 80.0–99.0)
PLATELET: 247 10*3/uL (ref 150–400)
RBC: 2.98 M/uL — ABNORMAL LOW (ref 4.10–5.70)
RDW: 17.6 % — ABNORMAL HIGH (ref 11.5–14.5)
WBC: 7.4 10*3/uL (ref 4.1–11.1)

## 2009-10-13 LAB — PTT: aPTT: 67.2 s — ABNORMAL HIGH (ref 24.0–33.0)

## 2009-10-13 LAB — TROPONIN I: Troponin-I, Qt.: 1.38 ng/mL — ABNORMAL HIGH (ref <0.05)

## 2009-10-13 LAB — METABOLIC PANEL, BASIC
Anion gap: 8 mmol/L (ref 5–15)
BUN/Creatinine ratio: 15 (ref 12–20)
BUN: 18 MG/DL (ref 6–20)
CO2: 28 MMOL/L (ref 21–32)
Calcium: 8.7 MG/DL (ref 8.5–10.1)
Chloride: 102 MMOL/L (ref 97–108)
Creatinine: 1.2 MG/DL (ref 0.6–1.3)
GFR est AA: >60 mL/min/{1.73_m2} (ref >60)
GFR est non-AA: >60 mL/min/{1.73_m2} (ref >60)
Glucose: 205 MG/DL — ABNORMAL HIGH (ref 65–100)
Potassium: 3.9 MMOL/L (ref 3.5–5.1)
Sodium: 138 MMOL/L (ref 136–145)

## 2009-10-13 LAB — PROTHROMBIN TIME + INR
INR: 1.6 — ABNORMAL HIGH (ref 0.9–1.1)
INR: 1.5 — ABNORMAL HIGH (ref 0.9–1.1)
Prothrombin time: 15.3 SECS — ABNORMAL HIGH (ref 9.0–11.0)
Prothrombin time: 14.5 SECS — ABNORMAL HIGH (ref 9.0–11.0)

## 2009-10-13 LAB — EKG, 12 LEAD, INITIAL
Atrial Rate: 99 {beats}/min
Calculated P Axis: 32 degrees
Calculated R Axis: 35 degrees
Calculated T Axis: 65 degrees
P-R Interval: 174 ms
Q-T Interval: 438 ms
QRS Duration: 174 ms
QTC Calculation (Bezet): 562 ms
Ventricular Rate: 99 {beats}/min

## 2009-10-13 LAB — GLUCOSE, POC
Glucose (POC): 283 mg/dL — ABNORMAL HIGH (ref 75–110)
Glucose (POC): 83 mg/dL (ref 75–110)
Glucose (POC): 357 mg/dL — ABNORMAL HIGH (ref 75–110)
Glucose (POC): 160 mg/dL — ABNORMAL HIGH (ref 75–110)

## 2009-10-13 MED ORDER — INSULIN NPH HUMAN RECOMB 100 UNIT/ML INJECTION
100 unit/mL | Freq: Every evening | SUBCUTANEOUS | Status: DC
Start: 2009-10-13 — End: 2009-10-15
  Administered 2009-10-15: 02:00:00 via SUBCUTANEOUS

## 2009-10-13 MED ORDER — SODIUM CHLORIDE 0.9 % IV
INTRAVENOUS | Status: DC
Start: 2009-10-13 — End: 2009-10-13
  Administered 2009-10-13: 14:00:00 via INTRAVENOUS

## 2009-10-13 MED ORDER — INSULIN NPH HUMAN RECOMB 100 UNIT/ML INJECTION
100 unit/mL | Freq: Every day | SUBCUTANEOUS | Status: DC
Start: 2009-10-13 — End: 2009-10-15
  Administered 2009-10-13 – 2009-10-15 (×3): via SUBCUTANEOUS

## 2009-10-13 MED ORDER — LISINOPRIL 5 MG TAB
5 mg | Freq: Every day | ORAL | Status: DC
Start: 2009-10-13 — End: 2009-10-15
  Administered 2009-10-14 – 2009-10-15 (×2): via ORAL

## 2009-10-13 MED ORDER — BUMETANIDE 1 MG TAB
1 mg | Freq: Every day | ORAL | Status: DC
Start: 2009-10-13 — End: 2009-10-15
  Administered 2009-10-14 – 2009-10-15 (×2): via ORAL

## 2009-10-13 MED ORDER — INSULIN ASPART 100 UNIT/ML INJECTION
100 unit/mL | Freq: Four times a day (QID) | SUBCUTANEOUS | Status: DC
Start: 2009-10-13 — End: 2009-10-15
  Administered 2009-10-13 – 2009-10-15 (×6): via SUBCUTANEOUS

## 2009-10-13 MED ORDER — INSULIN ASPART 100 UNIT/ML INJECTION
100 unit/mL | Freq: Three times a day (TID) | SUBCUTANEOUS | Status: DC
Start: 2009-10-13 — End: 2009-10-15
  Administered 2009-10-13 – 2009-10-15 (×5): via SUBCUTANEOUS

## 2009-10-13 MED FILL — INSULIN ASPART 100 UNIT/ML INJECTION: 100 unit/mL | SUBCUTANEOUS | Qty: 1

## 2009-10-13 MED FILL — PLAVIX 75 MG TABLET: 75 mg | ORAL | Qty: 1

## 2009-10-13 MED FILL — DOK 100 MG CAPSULE: 100 mg | ORAL | Qty: 1

## 2009-10-13 MED FILL — SODIUM CHLORIDE 0.9 % IV: INTRAVENOUS | Qty: 1000

## 2009-10-13 MED FILL — FAMOTIDINE 20 MG TAB: 20 mg | ORAL | Qty: 1

## 2009-10-13 MED FILL — FISH OIL 340 MG-1,000 MG CAPSULE: 340-1000 mg | ORAL | Qty: 2

## 2009-10-13 MED FILL — METOPROLOL TARTRATE 50 MG TAB: 50 mg | ORAL | Qty: 2

## 2009-10-13 MED FILL — INSULIN NPH HUMAN RECOMB 100 UNIT/ML INJECTION: 100 unit/mL | SUBCUTANEOUS | Qty: 16

## 2009-10-13 MED FILL — TERAZOSIN 5 MG CAP: 5 mg | ORAL | Qty: 2

## 2009-10-13 MED FILL — ACETYLCYSTEINE 20 % (200 MG/ML) SOLN: 200 mg/mL (20 %) | Qty: 30

## 2009-10-13 MED FILL — AMLODIPINE 5 MG TAB: 5 mg | ORAL | Qty: 2

## 2009-10-13 MED FILL — CRESTOR 10 MG TABLET: 10 mg | ORAL | Qty: 2

## 2009-10-13 MED FILL — ASPIRIN 81 MG CHEWABLE TAB: 81 mg | ORAL | Qty: 2

## 2009-10-13 MED FILL — HEPARIN (PORCINE) IN D5W 25,000 UNIT/250 ML IV: 25000 unit/250 mL(100 unit/mL) | INTRAVENOUS | Qty: 250

## 2009-10-13 MED FILL — INSULIN NPH HUMAN RECOMB 100 UNIT/ML INJECTION: 100 unit/mL | SUBCUTANEOUS | Qty: 8

## 2009-10-13 MED FILL — LEVOTHYROXINE 150 MCG TAB: 150 mcg | ORAL | Qty: 1

## 2009-10-13 NOTE — Progress Notes (Signed)
Bedside and Verbal shift change report given to Murlean Hark RN (oncoming nurse) by Hassan Rowan RN (offgoing nurse).  Report given with SBAR, Kardex, Procedure Summary, Intake/Output, MAR and Recent Results.

## 2009-10-13 NOTE — Progress Notes (Signed)
[facility]  [address] [place], [place] [address]  [phone]      Medical Progress Note      NAME: Travis [name]   DOB:  Mar 28, 1941  MRM:  [id number]    Date/Time: 10/13/2009  7:51 AM       Assessment and Plan:     CAD (coronary artery disease)- Cards is following this issue.  Appears to have had another NSTEMI. Cards is planning cath now.  So patient will have heparin stopped, and prepped for cath.  If not today, it may be Monday.  In the meantime, will continue usual meds per cards, including ASA and plavix.    CHF (congestive heart failure)- Preserved LV function, but will be examined again on cath.  At high risk for worsening.    Chronic kidney disease, stage III (moderate)- Stable for now.  At risk due to DM and contrast dye.  Needs hydration and mucomyst.    HTN- Well controlled on Lisinopril, Norvasc and Metoprolol.    Anemia, unspecified- Follow hemoccult.  Labs show B12, folate and iron okay.  Taking EPO.      Back pain- Prn tylenol and percocet.    Acute myocardial infarction, unspecified site, initial episode of care- Cards managing issue as above.    DM renal manif type II- Somewhat labile, and per suggestion will make SSI scale high sensitivity.    PVD (peripheral vascular disease)- Will need to return to coumadin after cath.    Hyperlipidemia- Continue Crestor and fish oil.    Hypothyroidism- Continue synthroid. TSH was normal at 1.82.         Subjective:     Chief Complaint:  Chest pain yesterday is gone.  "Just want to get out of here"    ROS:  (bold if positive, if negative)      Tolerating PT  Tolerating Diet        Objective:     Last 24hrs VS reviewed since prior progress note. Most recent are:    Visit Vitals   Item Reading   ??? BP 157/69   ??? Pulse 76   ??? Temp 98.2 ??F (36.8 ??C)   ??? Resp 18   ??? Ht 5\' 6"  (1.676 m)   ??? Wt 182 lb   ??? BMI 29.38 kg/m2   ??? SpO2 99%       SpO2 Readings from Last 6 Encounters:   10/13/09 99%   10/13/09 99%   03/31/09 94%       O2 Flow Rate (L/min): 2 l/min     Intake/Output Summary (Last 24 hours) at 10/13/09 0751  Last data filed at 10/13/09 0331   Gross per 24 hour   Intake  653.8 ml   Output      0 ml   Net  653.8 ml        Physical Exam:    Gen:  Well-developed, well-nourished, in no acute distress  HEENT:  Pink conjunctivae, PERRL, hearing intact to voice, moist mucous membranes  Neck:  Supple, without masses, thyroid non-tender  Resp:  No accessory muscle use, clear breath sounds without wheezes rales or rhonchi  Card:  No murmurs, normal S1, S2 without thrills, bruits or peripheral edema  Abd:  Soft, non-tender, non-distended, normoactive bowel sounds are present, no palpable mass  Lymph:  No cervical or inguinal adenopathy  Musc:  No cyanosis or clubbing  Skin:  No rashes or ulcers, skin turgor is good  Neuro:  Cranial nerves are grossly intact, no focal motor weakness, follows commands appropriately  Psych:  Good insight, oriented to person, place and time, alert    Telemetry reviewed:   normal sinus rhythm  __________________________________________________________________  Medications Reviewed: (see below)  Medications:     Current facility-administered medications   Medication Dose Route Frequency   ??? insulin NPH (NOVOLIN, HUMULIN) injection 16 Units  16 Units SubCUTAneous ACB   ??? insulin NPH (NOVOLIN, HUMULIN) injection 10 Units  10 Units SubCUTAneous QHS   ??? insulin aspart (NOVOLOG) injection 5 Units  5 Units SubCUTAneous TIDAC   ??? insulin aspart (NOVOLOG)   SubCUTAneous AC&HS   ??? amlodipine (NORVASC) tablet 10 mg  10 mg Oral DAILY   ??? amlodipine (NORVASC) tablet 5 mg  5 mg Oral ONCE   ??? acetylcysteine (MUCOMYST) 20 % (200 mg/mL) solution 1,200 mg  1,200 mg Oral BID   ??? heparin 25,000 units in D5W 250 ml infusion  12-25 Units/kg/hr IntraVENous TITRATE   ??? metoprolol (LOPRESSOR) tablet 100 mg  100 mg Oral BID   ??? lisinopril (PRINIVIL, ZESTRIL) tablet 10 mg  10 mg Oral DAILY    ??? pantoprazole (PROTONIX) tablet 40 mg  40 mg Oral ACD   ??? epoetin alfa (EPOGEN;PROCRIT) injection 10,000 Units  10,000 Units SubCUTAneous Q7D   ??? aspirin chewable tablet 162 mg  162 mg Oral DAILY   ??? heparin (porcine) injection 4,000 Units  4,000 Units IntraVENous PRN   ??? heparin (porcine) injection 2,000 Units  2,000 Units IntraVENous PRN   ??? dextrose (D50W) injection Syrg 25 g  25 g IntraVENous PRN   ??? clopidogrel (PLAVIX) tablet 75 mg  75 mg Oral DAILY   ??? levothyroxine (SYNTHROID) tablet 150 mcg  150 mcg Oral ACB   ??? nitroglycerin (NITROSTAT) tablet 0.4 mg  0.4 mg SubLINGual Q5MIN PRN   ??? fish oil-omega-3 fatty acids 340-1,000 mg capsule 2 Cap  2 Cap Oral BID   ??? rosuvastatin (CRESTOR) tablet 20 mg  20 mg Oral DAILY   ??? terazosin (HYTRIN) capsule 10 mg  10 mg Oral QHS   ??? docusate sodium (COLACE) capsule 100 mg  100 mg Oral BID   ??? bisacodyl (DULCOLAX) tablet 5 mg  5 mg Oral DAILY PRN   ??? bisacodyl (DULCOLAX) suppository 10 mg  10 mg Rectal DAILY PRN   ??? oxycodone-acetaminophen (PERCOCET) 5-325 mg per tablet 2 Tab  2 Tab Oral Q6H PRN   ??? morphine injection 2 mg  2 mg IntraVENous Q4H PRN   ??? glucose chewable tablet 12 g  12 g Oral PRN   ??? glucagon (GLUCAGEN) injection 1 mg  1 mg IntraMUSCular PRN   ??? famotidine (PEPCID) tablet 20 mg  20 mg Oral DAILY          Lab Data Reviewed: (see below)  Lab Review:     Recent Labs   [facility] 10/12/09 0407   ??? WBC 6.1   ??? HGB 8.1*   ??? HCT 25.4*   ??? PLT 251       Recent Labs   Basename 10/13/09 0453 10/12/09 0407 10/11/09 2357   ??? NA -- -- --   ??? K -- -- --   ??? CL -- -- --   ??? CO2 -- -- --   ??? GLU -- -- --   ??? BUN -- -- --   ??? CREA -- -- --   ??? CA -- -- --   ??? MG -- -- --   ???  PHOS -- -- --   ??? ALB -- -- --   ??? TBIL -- -- --   ??? SGOT -- -- --   ??? INR 1.6* 1.6* 1.5*       Lab Results   Component Value Date/Time    POC GLUCOSE 283 10/12/09 09:25 PM    POC GLUCOSE 133 10/12/09 05:43 PM    POC GLUCOSE 76 10/12/09 05:20 PM    POC GLUCOSE 54 10/12/09 04:57 PM     POC GLUCOSE 250 10/12/09 12:06 PM         No results found for this basename: PH:3,PCO2:3,PO2:3,HCO3:3,FIO2:3 in the last 72 hours  Recent Labs   Basename 10/13/09 0453 10/12/09 0407 10/11/09 2357   ??? INR 1.6* 1.6* 1.5*         Other pertinent lab: Elevated troponin.    Total time spent with patient: 16 Minutes                  Care Plan discussed with: Patient, Nursing Staff and >50% of time spent in counseling and coordination of care    Discussed:  Care Plan and D/C Planning    Prophylaxis:  Coumadin and PPI, hepatine drip off today.    Disposition:  Home w/Family           ___________________________________________________    Attending Physician: Salvadore Dom, MD

## 2009-10-13 NOTE — Progress Notes (Addendum)
S: No chest pain overnight; feels OK - ruled in for MI with chest pain during the day  Physical Exam:   BP BP 161/69   Pulse 76   Temp 98.4 ??F (36.9 ??C)   Resp 18   Ht 5\' 6"  (1.676 m)   Wt 182 lb (82.555 kg)   BMI 29.38 kg/m2   SpO2 99%   Patient appears generally well, mood and affect are appropriate and pleasant.   HEENT: Normocephalic, atraumatic.   Neck Exam: Supple,   Lung Exam: Clear to auscultation, even breath sounds.   Cardiac Exam: Regular rate and rhythm with 1-2/6 systolic murmur.   Abdomen: Soft, non-tender, non-distended, normal bowel sounds.   Extremities: mild lower extremity edema.   Vasc - poor fem pulse bilat; + bilat fem bruits    Recent Results (from the past 24 hour(s))   TROPONIN I    Collection Time    10/12/09 10:28 AM   Component Value Range   ??? Troponin-I, Qt. 0.17 (*) <0.05 (ng/mL)   CK W/ CKMB & INDEX    Collection Time    10/12/09 10:28 AM   Component Value Range   ??? CK 131  39 - 308 (U/L)   ??? CK - MB 10.3 (*) 0.5 - 3.6 (NG/ML)   ??? CK-MB Index 7.9 (*) 0 - 2.5 ( )   GLUCOSE, POC    Collection Time    10/12/09 12:06 PM   Component Value Range   ??? POC GLUCOSE 250 (*) 75 - 110 (mg/dL)   CK    Collection Time    10/12/09  2:26 PM   Component Value Range   ??? CK 181  39 - 308 (U/L)   TROPONIN I    Collection Time    10/12/09  2:26 PM   Component Value Range   ??? Troponin-I, Qt. 2.81 (*) <0.05 (ng/mL)   GLUCOSE, POC    Collection Time    10/12/09  4:57 PM   Component Value Range   ??? POC GLUCOSE 54 (*) 75 - 110 (mg/dL)   GLUCOSE, POC    Collection Time    10/12/09  5:20 PM   Component Value Range   ??? POC GLUCOSE 76  75 - 110 (mg/dL)   GLUCOSE, POC    Collection Time    10/12/09  5:43 PM   Component Value Range   ??? POC GLUCOSE 133 (*) 75 - 110 (mg/dL)   GLUCOSE, POC    Collection Time    10/12/09  9:25 PM   Component Value Range   ??? POC GLUCOSE 283 (*) 75 - 110 (mg/dL)   PROTHROMBIN TIME    Collection Time    10/13/09  4:53 AM   Component Value Range   ??? INR 1.6 (*) 0.9 - 1.1 ( )    ??? Prothrombin Time-PT 15.3 (*) 9.0 - 11.0 (SECS)   TROPONIN I    Collection Time    10/13/09  4:53 AM   Component Value Range   ??? Troponin-I, Qt. 1.38 (*) <0.05 (ng/mL)   GLUCOSE, POC    Collection Time    10/13/09  8:46 AM   Component Value Range   ??? POC GLUCOSE 83  75 - 110 (mg/dL)     Assessment and Plan:   1) NSTEMI   -he had another MI.    - will plan to proceed with a cath, but because of his PVD would like to do so with radial access if  possible.  This approach is not available at [facility]. [name].  I've called Dr. Marny Lowenstein at [facility] to see if he would accept the patient for a cath there.   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  He also is at high risk of vascular complications.   His echo shows inf-lat HK and his myoview shows decreased perfus    2) HTN - have increased Norvasc  3) PVD - continue heparin; hold coumadin until we complete the cath      Tharon Aquas, MD, [facility]     ADDENDUM:  I spoke to Dr. Orvil Feil - will transfer to [facility] on Sunday for cath on Monday through radial access.  Dr. Orvil Feil also has particular interest in PCI's for chronic occlusions.   I also went ahead and sent test for plavix resistence.    Tharon Aquas, MD, [facility]

## 2009-10-13 NOTE — Progress Notes (Signed)
[facility] Progress Note    Assessment:     1.GNF:AOZHYQMV.  2.CKD:2 to DM,HTN:Baseline cr 1.5-2.0 mg%     -cr at baseline  3.Hypotension:better   4.Proteinuria:2 to DM,on lisinopeil  5.Anemia:ok Iron panel    6.DM   7.NSTEMI:another one yesterday.Cath today    Plan:     1.IVF for cath  2.cr daily        Subjective:     HPI: Patient seen.  Chest pain yesterday.    Current facility-administered medications   Medication Dose Route Frequency   ??? insulin NPH (NOVOLIN, HUMULIN) injection 14 Units  14 Units SubCUTAneous ACB   ??? insulin NPH (NOVOLIN, HUMULIN) injection 8 Units  8 Units SubCUTAneous QHS   ??? amlodipine (NORVASC) tablet 10 mg  10 mg Oral DAILY   ??? amlodipine (NORVASC) tablet 5 mg  5 mg Oral ONCE   ??? acetylcysteine (MUCOMYST) 20 % (200 mg/mL) solution 1,200 mg  1,200 mg Oral BID   ??? heparin 25,000 units in D5W 250 ml infusion  12-25 Units/kg/hr IntraVENous TITRATE   ??? insulin aspart (NOVOLOG) 100 unit/mL injection 4 Units  4 Units SubCUTAneous TIDAC   ??? metoprolol (LOPRESSOR) tablet 100 mg  100 mg Oral BID   ??? lisinopril (PRINIVIL, ZESTRIL) tablet 10 mg  10 mg Oral DAILY   ??? pantoprazole (PROTONIX) tablet 40 mg  40 mg Oral ACD   ??? epoetin alfa (EPOGEN;PROCRIT) injection 10,000 Units  10,000 Units SubCUTAneous Q7D   ??? aspirin chewable tablet 162 mg  162 mg Oral DAILY   ??? heparin (porcine) injection 4,000 Units  4,000 Units IntraVENous PRN   ??? heparin (porcine) injection 2,000 Units  2,000 Units IntraVENous PRN   ??? dextrose (D50W) injection Syrg 25 g  25 g IntraVENous PRN   ??? insulin aspart (NOVOLOG)   SubCUTAneous AC&HS   ??? clopidogrel (PLAVIX) tablet 75 mg  75 mg Oral DAILY   ??? levothyroxine (SYNTHROID) tablet 150 mcg  150 mcg Oral ACB   ??? nitroglycerin (NITROSTAT) tablet 0.4 mg  0.4 mg SubLINGual Q5MIN PRN   ??? fish oil-omega-3 fatty acids 340-1,000 mg capsule 2 Cap  2 Cap Oral BID   ??? rosuvastatin (CRESTOR) tablet 20 mg  20 mg Oral DAILY    ??? terazosin (HYTRIN) capsule 10 mg  10 mg Oral QHS   ??? docusate sodium (COLACE) capsule 100 mg  100 mg Oral BID   ??? bisacodyl (DULCOLAX) tablet 5 mg  5 mg Oral DAILY PRN   ??? bisacodyl (DULCOLAX) suppository 10 mg  10 mg Rectal DAILY PRN   ??? oxycodone-acetaminophen (PERCOCET) 5-325 mg per tablet 2 Tab  2 Tab Oral Q6H PRN   ??? morphine injection 2 mg  2 mg IntraVENous Q4H PRN   ??? glucose chewable tablet 12 g  12 g Oral PRN   ??? glucagon (GLUCAGEN) injection 1 mg  1 mg IntraMUSCular PRN   ??? famotidine (PEPCID) tablet 20 mg  20 mg Oral DAILY            Review of Systems  Pertinent items are noted in HPI.    Objective:     Vitals:  Blood pressure 157/69, pulse 76, temperature 98.2 ??F (36.8 ??C), resp. rate 18, height 5\' 6"  (1.676 m), weight 182 lb (82.555 kg), SpO2 99.00%.  Temp (24hrs), Avg:98.5 ??F (36.9 ??C), Min:98.2 ??F (36.8 ??C), Max:98.8 ??F (37.1 ??C)      Intake and Output:     In: 1135.4 [P.O.:950; I.V.:185.4]  Out: -     Physical Exam:                Patient is not currently intubated    GEN: NAD   RS: CTA, negative for crackles   CVS: S1S2  ABD: Soft, Non Tender  GU:  No Foley   EXT: TRACE Edema  SKIN: No Rash   NEURO: Grossly focal      ECG: unchanged from previous tracings     Data Review        No results found for this basename: ITNL in the last 72 hours   Recent Labs   Basename 10/13/09 0453 10/12/09 1426 10/12/09 1028   ??? CPK -- 181 131   ??? CKMB -- -- --   ??? TROIQ 1.38* 2.81* 0.17*       Recent Labs   [facility] 10/12/09 0407   ??? NA --   ??? K --   ??? CL --   ??? CO2 --   ??? BUN --   ??? CREA --   ??? GLU --   ??? PHOS --   ??? MG --   ??? ALB --   ??? WBC 6.1   ??? HGB 8.1*   ??? HCT 25.4*   ??? PLT 251         Recent Labs   Basename 10/13/09 0453 10/12/09 0407 10/11/09 2357   ??? INR 1.6* 1.6* 1.5*   ??? PTP 15.3* 15.3* 14.6*   ??? APTT -- -- --       Needs: urine analysis, urine sodium, protein and creatinine  Lab Results   Component Value Date/Time    Creatinine,urine random 33.7 05/31/09 07:20 PM             Discussed with:    Patient

## 2009-10-13 NOTE — Progress Notes (Signed)
Bedside shift change report given to Clara B, RN (oncoming nurse) by Jeb Levering, RN (offgoing nurse).  Report given with SBAR, Kardex, MAR and Recent Results. Karl Luke, RN

## 2009-10-14 LAB — CBC WITH AUTOMATED DIFF
ABS. BASOPHILS: 0 10*3/uL (ref 0.0–0.1)
ABS. EOSINOPHILS: 0.2 10*3/uL (ref 0.0–0.4)
ABS. LYMPHOCYTES: 2.7 10*3/uL (ref 0.8–3.5)
ABS. MONOCYTES: 0.8 10*3/uL (ref 0.0–1.0)
ABS. NEUTROPHILS: 3.8 10*3/uL (ref 1.8–8.0)
BASOPHILS: 0 % (ref 0–1)
EOSINOPHILS: 2 % (ref 0–7)
HCT: 25.2 % — ABNORMAL LOW (ref 36.6–50.3)
HGB: 8.3 g/dL — ABNORMAL LOW (ref 12.1–17.0)
LYMPHOCYTES: 36 % (ref 12–49)
MCH: 29.6 PG (ref 26.0–34.0)
MCHC: 32.9 g/dL (ref 30.0–36.5)
MCV: 90 FL (ref 80.0–99.0)
MONOCYTES: 10 % (ref 5–13)
NEUTROPHILS: 52 % (ref 32–75)
PLATELET: 237 10*3/uL (ref 150–400)
RBC: 2.8 M/uL — ABNORMAL LOW (ref 4.10–5.70)
RDW: 17.7 % — ABNORMAL HIGH (ref 11.5–14.5)
WBC: 7.5 10*3/uL (ref 4.1–11.1)

## 2009-10-14 LAB — METABOLIC PANEL, BASIC
Anion gap: 8 mmol/L (ref 5–15)
BUN/Creatinine ratio: 15 (ref 12–20)
BUN: 19 MG/DL (ref 6–20)
CO2: 25 MMOL/L (ref 21–32)
Calcium: 8.2 MG/DL — ABNORMAL LOW (ref 8.5–10.1)
Chloride: 101 MMOL/L (ref 97–108)
Creatinine: 1.3 MG/DL (ref 0.6–1.3)
GFR est AA: >60 mL/min/{1.73_m2} (ref >60)
GFR est non-AA: 58 mL/min/{1.73_m2} — ABNORMAL LOW (ref >60)
Glucose: 340 MG/DL — ABNORMAL HIGH (ref 65–100)
Potassium: 4.5 MMOL/L (ref 3.5–5.1)
Sodium: 134 MMOL/L — ABNORMAL LOW (ref 136–145)

## 2009-10-14 LAB — PTT
aPTT: 56.3 s — ABNORMAL HIGH (ref 24.0–33.0)
aPTT: 75.1 s — ABNORMAL HIGH (ref 24.0–33.0)

## 2009-10-14 LAB — GLUCOSE, POC
Glucose (POC): 92 mg/dL (ref 75–110)
Glucose (POC): 449 mg/dL — ABNORMAL HIGH (ref 75–110)
Glucose (POC): 296 mg/dL — ABNORMAL HIGH (ref 75–110)
Glucose (POC): 65 mg/dL — ABNORMAL LOW (ref 75–110)
Glucose (POC): 98 mg/dL (ref 75–110)

## 2009-10-14 LAB — PROTHROMBIN TIME + INR
INR: 1.3 — ABNORMAL HIGH (ref 0.9–1.1)
Prothrombin time: 12.7 SECS — ABNORMAL HIGH (ref 9.0–11.0)

## 2009-10-14 LAB — MAGNESIUM: Magnesium: 2 MG/DL (ref 1.6–2.4)

## 2009-10-14 LAB — PHOSPHORUS: Phosphorus: 3.4 MG/DL (ref 2.5–4.9)

## 2009-10-14 MED ORDER — BUMETANIDE 0.25 MG/ML IJ SOLN
0.25 mg/mL | Freq: Once | INTRAMUSCULAR | Status: AC
Start: 2009-10-14 — End: 2009-10-14
  Administered 2009-10-14: 22:00:00 via INTRAVENOUS

## 2009-10-14 MED ORDER — FAMOTIDINE 20 MG TAB
20 mg | Freq: Two times a day (BID) | ORAL | Status: DC
Start: 2009-10-14 — End: 2009-10-14

## 2009-10-14 MED ORDER — FAMOTIDINE 20 MG TAB
20 mg | Freq: Every day | ORAL | Status: DC
Start: 2009-10-14 — End: 2009-10-15
  Administered 2009-10-15: 13:00:00 via ORAL

## 2009-10-14 MED FILL — LEVOTHYROXINE 150 MCG TAB: 150 mcg | ORAL | Qty: 1

## 2009-10-14 MED FILL — INSULIN NPH HUMAN RECOMB 100 UNIT/ML INJECTION: 100 unit/mL | SUBCUTANEOUS | Qty: 16

## 2009-10-14 MED FILL — FISH OIL 340 MG-1,000 MG CAPSULE: 340-1000 mg | ORAL | Qty: 2

## 2009-10-14 MED FILL — BUMETANIDE 0.25 MG/ML IJ SOLN: 0.25 mg/mL | INTRAMUSCULAR | Qty: 4

## 2009-10-14 MED FILL — ASPIRIN 81 MG CHEWABLE TAB: 81 mg | ORAL | Qty: 2

## 2009-10-14 MED FILL — PANTOPRAZOLE 40 MG TAB, DELAYED RELEASE: 40 mg | ORAL | Qty: 1

## 2009-10-14 MED FILL — DOK 100 MG CAPSULE: 100 mg | ORAL | Qty: 1

## 2009-10-14 MED FILL — TERAZOSIN 5 MG CAP: 5 mg | ORAL | Qty: 2

## 2009-10-14 MED FILL — PLAVIX 75 MG TABLET: 75 mg | ORAL | Qty: 1

## 2009-10-14 MED FILL — ACETYLCYSTEINE 20 % (200 MG/ML) SOLN: 200 mg/mL (20 %) | Qty: 30

## 2009-10-14 MED FILL — FAMOTIDINE 20 MG TAB: 20 mg | ORAL | Qty: 1

## 2009-10-14 MED FILL — METOPROLOL TARTRATE 50 MG TAB: 50 mg | ORAL | Qty: 2

## 2009-10-14 MED FILL — AMLODIPINE 5 MG TAB: 5 mg | ORAL | Qty: 2

## 2009-10-14 MED FILL — LISINOPRIL 5 MG TAB: 5 mg | ORAL | Qty: 2

## 2009-10-14 MED FILL — INSULIN ASPART 100 UNIT/ML INJECTION: 100 unit/mL | SUBCUTANEOUS | Qty: 1

## 2009-10-14 MED FILL — BUMETANIDE 1 MG TAB: 1 mg | ORAL | Qty: 1

## 2009-10-14 MED FILL — CRESTOR 10 MG TABLET: 10 mg | ORAL | Qty: 2

## 2009-10-14 NOTE — Progress Notes (Signed)
Patient up in chair with wife in room. Patient denies HA, CP, or N/V.  Shampoo cap and clean gowns provided.  Will continue to monitor. cjohnson,BSN

## 2009-10-14 NOTE — Progress Notes (Signed)
: No chest pain overnight; feels OK - ruled in for MI with chest pain during the day   Physical Exam:   BP 143/67   Pulse 82   Temp 98.3 ??F (36.8 ??C)   Resp 18   Ht 5\' 6"  (1.676 m)   Wt 184 lb 3.2 oz (83.553 kg)   BMI 29.73 kg/m2   SpO2 98%   Patient appears generally well, mood and affect are appropriate and pleasant.   HEENT: Normocephalic, atraumatic.   Neck Exam: Supple,   Lung Exam: Clear to auscultation, even breath sounds.   Cardiac Exam: Regular rate and rhythm with 1-2/6 systolic murmur.   Abdomen: Soft, non-tender, non-distended, normal bowel sounds.   Extremities: mild lower extremity edema.   Vasc - poor fem pulse bilat; + bilat fem bruits    Recent Results (from the past 24 hour(s))   GLUCOSE, POC    Collection Time    10/13/09 12:26 PM   Component Value Range   ??? POC GLUCOSE 357 (*) 75 - 110 (mg/dL)   MISC. LAB TEST    Collection Time    10/13/09  1:44 PM   Component Value Range   ??? Test Description: PLAVIX RESISTENCE TESTING     ??? Reference Lab: [facility]     ??? Results: PENDING     GLUCOSE, POC    Collection Time    10/13/09  4:51 PM   Component Value Range   ??? POC GLUCOSE 160 (*) 75 - 110 (mg/dL)   GLUCOSE, POC    Collection Time    10/13/09  8:40 PM   Component Value Range   ??? POC GLUCOSE 92  75 - 110 (mg/dL)   PROTHROMBIN TIME    Collection Time    10/14/09  4:55 AM   Component Value Range   ??? INR 1.3 (*) 0.9 - 1.1 ( )   ??? Prothrombin Time-PT 12.7 (*) 9.0 - 11.0 (SECS)   METABOLIC PANEL, BASIC    Collection Time    10/14/09  4:55 AM   Component Value Range   ??? Sodium 134 (*) 136 - 145 (MMOL/L)   ??? Potassium 4.5  3.5 - 5.1 (MMOL/L)   ??? Chloride 101  97 - 108 (MMOL/L)   ??? CO2 25  21 - 32 (MMOL/L)   ??? Anion gap 8  5 - 15 (mmol/L)   ??? Glucose 340 (*) 65 - 100 (MG/DL)   ??? BUN 19  6 - 20 (MG/DL)   ??? Creatinine 1.3  0.6 - 1.3 (MG/DL)   ??? BUN/Creatinine ratio 15  12 - 20 ( )   ??? GFR est AA >60  >60 (ml/min/1.47m2)   ??? GFR est non-AA 58 (*) >60 (ml/min/1.37m2)   ??? Calcium 8.2 (*) 8.5 - 10.1 (MG/DL)    MAGNESIUM    Collection Time    10/14/09  4:55 AM   Component Value Range   ??? Magnesium 2.0  1.6 - 2.4 (MG/DL)   CBC WITH AUTOMATED DIFF    Collection Time    10/14/09  4:55 AM   Component Value Range   ??? WBC 7.5  4.1 - 11.1 (K/uL)   ??? RBC 2.80 (*) 4.10 - 5.70 (M/uL)   ??? HGB 8.3 (*) 12.1 - 17.0 (g/dL)   ??? HCT 25.2 (*) 36.6 - 50.3 (%)   ??? MCV 90.0  80.0 - 99.0 (FL)   ??? MCH 29.6  26.0 - 34.0 (PG)   ??? MCHC 32.9  30.0 - 36.5 (g/dL)   ??? RDW 17.7 (*) 11.5 - 14.5 (%)   ??? PLATELET 237  150 - 400 (K/uL)   ??? NEUTROPHILS 52  32 - 75 (%)   ??? LYMPHOCYTES 36  12 - 49 (%)   ??? MONOCYTES 10  5 - 13 (%)   ??? EOSINOPHILS 2  0 - 7 (%)   ??? BASOPHILS 0  0 - 1 (%)   ??? ABSOLUTE NEUTS 3.8  1.8 - 8.0 (K/UL)   ??? ABSOLUTE LYMPHS 2.7  0.8 - 3.5 (K/UL)   ??? ABSOLUTE MONOS 0.8  0.0 - 1.0 (K/UL)   ??? ABSOLUTE EOSINS 0.2  0.0 - 0.4 (K/UL)   ??? ABSOLUTE BASOS 0.0  0.0 - 0.1 (K/UL)   PHOSPHORUS    Collection Time    10/14/09  4:55 AM   Component Value Range   ??? Phosphorus 3.4  2.5 - 4.9 (MG/DL)   PTT    Collection Time    10/14/09  4:55 AM   Component Value Range   ??? aPTT 56.3 (*) 24.0 - 33.0 (sec)   GLUCOSE, POC    Collection Time    10/14/09  8:02 AM   Component Value Range   ??? POC GLUCOSE 449 (*) 75 - 110 (mg/dL)       Assessment and Plan:  1) NSTEMI   - He has had 2 MI's the past couple of weeks.      - will plan to proceed with a cath, but because of his PVD would like to do so with arm access if possible. Radial access is not available at [facility] so patient will be transferred to [facility] where Dr. Orvil Feil will do the procedure - he has particular expertise in radial cases and as well as interventions for chronic lesions.  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 He also is at high risk of vascular complications. His echo shows inf-lat HK and his resting myoview shows decreased perfusion in the inferolateral wall.    - Plavix resistance testing has been sent     2) HTN - stable    3) PVD with Angioplasty right common femoral and proximal superficial femoral artery angioplasty of proximal femoral-popliteal on 08/09/09   - Vascular surgery has requested 3 months of anti-coagulation post-procedure (until 11/09/09)   -  Plan to continue heparin; hold coumadin until we complete the cath; After the cath he will need bridging anti-coagulation until INR therapeutic 2-3.    4) Anemia Multifactorial (including CKD) -     - Given his recent, recurrent MI's possibly from supply demand mismatch and anemia (Hgb ~ 8) will go ahead and transfuse another unit of PRBC's to get Hgb a little higher.   - Upper and lower endoscopy this admission has shown superficial ulcers. GI has recommended continued use of PPI with H2B therapy.    5) Chronic edema - stable; continue Bumex.      6) CKD - stable; will try mucomyst and NaBicarb gtt prior to cath.  Will need to minimize dye use with the cath.    Tharon Aquas, MD, [facility]

## 2009-10-14 NOTE — Progress Notes (Signed)
Bedside and Verbal shift change report given to Juluis Pitch RN (oncoming nurse) by Hassan Rowan RN (offgoing nurse).  Report given with SBAR, Kardex, Intake/Output, MAR and Recent Results.

## 2009-10-14 NOTE — Progress Notes (Signed)
[facility]  [address] [facility] [place], [place] [address]  [phone]      Medical Progress Note      NAME: Travis [name]   DOB:  04-11-1941  MRM:  [id number]    Date/Time: 10/14/2009  7:46 AM       Assessment and Plan:     CAD (coronary artery disease)- Cards is following this issue.  Appears to have had another NSTEMI. Cards is planning cath, but has to be done at another hospital in order to do access from arm.  So patient will be transferred Sunday, for cath on Monday.  In the meantime, will continue usual meds per cards, including ASA and plavix.    CHF (congestive heart failure)- Preserved LV function, but will be examined again on cath.  At high risk for worsening.    Chronic kidney disease, stage III (moderate)- Stable for now.  At risk due to DM and contrast dye.  Needs hydration and mucomyst prior to cath.    HTN- Well controlled on Lisinopril, Norvasc and Metoprolol.    Anemia, unspecified- Moderate, stable, does not need transfusion, unless cardiology wants it.  Follow hemoccult.  Labs show B12, folate and iron okay.  Taking EPO.      Back pain- Prn tylenol and percocet.    Acute myocardial infarction, unspecified site, initial episode of care- Cards managing issue as above.    DM renal manif type II- Somewhat labile, and per suggestion will make SSI scale high sensitivity.    PVD (peripheral vascular disease)- Will need to return to coumadin after cath.    Hyperlipidemia- Continue Crestor and fish oil.    Hypothyroidism- Continue synthroid. TSH was normal at 1.82.         Subjective:     Chief Complaint:  Chest pain is still gone.  "I want to know why I get chest pain"    ROS:  (bold if positive, if negative)      Tolerating PT  Tolerating Diet        Objective:     Last 24hrs VS reviewed since prior progress note. Most recent are:    Visit Vitals   Item Reading   ??? BP 143/67   ??? Pulse 82   ??? Temp 98.3 ??F (36.8 ??C)   ??? Resp 18   ??? Ht 5\' 6"  (1.676 m)   ??? Wt 184 lb 3.2 oz    ??? BMI 29.73 kg/m2   ??? SpO2 98%       SpO2 Readings from Last 6 Encounters:   10/14/09 98%   10/14/09 98%   03/31/09 94%      O2 Flow Rate (L/min): 2 l/min     Intake/Output Summary (Last 24 hours) at 10/14/09 0746  Last data filed at 10/14/09 0744   Gross per 24 hour   Intake 559.02 ml   Output   1250 ml   Net -690.98 ml        Physical Exam:    Gen:  Well-developed, well-nourished, in no acute distress  HEENT:  Pink conjunctivae, PERRL, hearing intact to voice, moist mucous membranes  Neck:  Supple, without masses, thyroid non-tender  Resp:  No accessory muscle use, clear breath sounds without wheezes rales or rhonchi  Card:  No murmurs, normal S1, S2 without thrills, bruits or peripheral edema  Abd:  Soft, non-tender, non-distended, normoactive bowel sounds are present, no palpable mass  Lymph:  No cervical or inguinal  adenopathy  Musc:  No cyanosis or clubbing  Skin:  No rashes or ulcers, skin turgor is good  Neuro:  Cranial nerves are grossly intact, no focal motor weakness, follows commands appropriately  Psych:  Good insight, oriented to person, place and time, alert    Telemetry reviewed:   normal sinus rhythm  __________________________________________________________________  Medications Reviewed: (see below)  Medications:     Current facility-administered medications   Medication Dose Route Frequency   ??? insulin NPH (NOVOLIN, HUMULIN) injection 16 Units  16 Units SubCUTAneous ACB   ??? insulin NPH (NOVOLIN, HUMULIN) injection 10 Units  10 Units SubCUTAneous QHS   ??? insulin aspart (NOVOLOG) injection 5 Units  5 Units SubCUTAneous TIDAC   ??? insulin aspart (NOVOLOG)   SubCUTAneous AC&HS   ??? lisinopril (PRINIVIL, ZESTRIL) tablet 10 mg  10 mg Oral DAILY   ??? bumetanide (BUMEX) tablet 1 mg  1 mg Oral DAILY   ??? amlodipine (NORVASC) tablet 10 mg  10 mg Oral DAILY   ??? acetylcysteine (MUCOMYST) 20 % (200 mg/mL) solution 1,200 mg  1,200 mg Oral BID    ??? heparin 25,000 units in D5W 250 ml infusion  12-25 Units/kg/hr IntraVENous TITRATE   ??? metoprolol (LOPRESSOR) tablet 100 mg  100 mg Oral BID   ??? pantoprazole (PROTONIX) tablet 40 mg  40 mg Oral ACD   ??? epoetin alfa (EPOGEN;PROCRIT) injection 10,000 Units  10,000 Units SubCUTAneous Q7D   ??? aspirin chewable tablet 162 mg  162 mg Oral DAILY   ??? heparin (porcine) injection 4,000 Units  4,000 Units IntraVENous PRN   ??? heparin (porcine) injection 2,000 Units  2,000 Units IntraVENous PRN   ??? dextrose (D50W) injection Syrg 25 g  25 g IntraVENous PRN   ??? clopidogrel (PLAVIX) tablet 75 mg  75 mg Oral DAILY   ??? levothyroxine (SYNTHROID) tablet 150 mcg  150 mcg Oral ACB   ??? nitroglycerin (NITROSTAT) tablet 0.4 mg  0.4 mg SubLINGual Q5MIN PRN   ??? fish oil-omega-3 fatty acids 340-1,000 mg capsule 2 Cap  2 Cap Oral BID   ??? rosuvastatin (CRESTOR) tablet 20 mg  20 mg Oral DAILY   ??? terazosin (HYTRIN) capsule 10 mg  10 mg Oral QHS   ??? docusate sodium (COLACE) capsule 100 mg  100 mg Oral BID   ??? bisacodyl (DULCOLAX) tablet 5 mg  5 mg Oral DAILY PRN   ??? bisacodyl (DULCOLAX) suppository 10 mg  10 mg Rectal DAILY PRN   ??? oxycodone-acetaminophen (PERCOCET) 5-325 mg per tablet 2 Tab  2 Tab Oral Q6H PRN   ??? morphine injection 2 mg  2 mg IntraVENous Q4H PRN   ??? glucose chewable tablet 12 g  12 g Oral PRN   ??? glucagon (GLUCAGEN) injection 1 mg  1 mg IntraMUSCular PRN   ??? famotidine (PEPCID) tablet 20 mg  20 mg Oral DAILY          Lab Data Reviewed: (see below)  Lab Review:     Recent Labs   Basename 10/14/09 0455 10/13/09 1000 10/12/09 0407   ??? WBC 7.5 7.4 6.1   ??? HGB 8.3* 8.7* 8.1*   ??? HCT 25.2* 26.3* 25.4*   ??? PLT 237 247 251       Recent Labs   Basename 10/14/09 0455 10/13/09 1000 10/13/09 0453   ??? NA 134* 138 --   ??? K 4.5 3.9 --   ??? CL 101 102 --   ??? CO2 25 28 --   ???  GLU 340* 205* --   ??? BUN 19 18 --   ??? CREA 1.3 1.2 --   ??? CA 8.2* 8.7 --   ??? MG 2.0 -- --   ??? PHOS 3.4 -- --   ??? ALB -- -- --   ??? TBIL -- -- --   ??? SGOT -- -- --    ??? INR 1.3* 1.5* 1.6*       Lab Results   Component Value Date/Time    POC GLUCOSE 92 10/13/09 08:40 PM    POC GLUCOSE 160 10/13/09 04:51 PM    POC GLUCOSE 357 10/13/09 12:26 PM    POC GLUCOSE 83 10/13/09 08:46 AM    POC GLUCOSE 283 10/12/09 09:25 PM         No results found for this basename: PH:3,PCO2:3,PO2:3,HCO3:3,FIO2:3 in the last 72 hours  Recent Labs   Basename 10/14/09 0455 10/13/09 1000 10/13/09 0453   ??? INR 1.3* 1.5* 1.6*         Other pertinent lab: Elevated troponin.    Total time spent with patient: 80 Minutes                  Care Plan discussed with: Patient, Nursing Staff and >50% of time spent in counseling and coordination of care    Discussed:  Care Plan and D/C Planning    Prophylaxis:  Coumadin and heparin drip    Disposition:  Home w/Family           ___________________________________________________    Attending Physician: Salvadore Dom, MD

## 2009-10-14 NOTE — Progress Notes (Signed)
PTT 56.3; increased Heparin gtt by 1unit/kg/hr per Heparin protocol. Recheck PTT in 6 hrs.

## 2009-10-14 NOTE — Progress Notes (Signed)
Bedside and Verbal shift change report given to Hassan Rowan RN (oncoming nurse) by Raeanne Barry RN (offgoing nurse).  Report given with SBAR, Kardex and MAR.

## 2009-10-14 NOTE — Progress Notes (Signed)
Cardiopulmonary Rehab:  Follow up to previous patient teaching regarding CHF.  Reviewed the importance of monitoring daily weights and adhering to a low sodium diet (less than 2000 mg.)  Reviewed MI signs and symptoms.  Patient concerned over recurring MI's.  Denied having any questions regarding radial cardiac catheterization.  Will follow as appropriate.

## 2009-10-15 ENCOUNTER — Inpatient Hospital Stay
Admit: 2009-10-15 | Discharge: 2009-10-17 | Disposition: A | Discharge status: Home / Self Care | Payer: MEDICARE | Source: Other Acute Inpatient Hospital | Attending: Cardiovascular Disease | Admitting: Cardiovascular Disease

## 2009-10-15 DIAGNOSIS — I214 Non-ST elevation (NSTEMI) myocardial infarction: Secondary | ICD-10-CM

## 2009-10-15 LAB — MISC. LAB TEST

## 2009-10-15 LAB — TYPE + CROSSMATCH
ABO/Rh(D): O POS
Antibody screen: NEGATIVE
Status of unit: TRANSFUSED
Unit division: 0

## 2009-10-15 LAB — GLUCOSE, POC
Glucose (POC): 289 mg/dL — ABNORMAL HIGH (ref 75–110)
Glucose (POC): 349 mg/dL — ABNORMAL HIGH (ref 75–110)
Glucose (POC): 293 mg/dL — ABNORMAL HIGH (ref 75–110)

## 2009-10-15 LAB — METABOLIC PANEL, BASIC
Anion gap: 6 mmol/L (ref 5–15)
BUN/Creatinine ratio: 16 (ref 12–20)
BUN: 22 MG/DL — ABNORMAL HIGH (ref 6–20)
CO2: 29 MMOL/L (ref 21–32)
Calcium: 8.9 MG/DL (ref 8.5–10.1)
Chloride: 99 MMOL/L (ref 97–108)
Creatinine: 1.4 MG/DL — ABNORMAL HIGH (ref 0.6–1.3)
GFR est AA: >60 mL/min/{1.73_m2} (ref >60)
GFR est non-AA: 53 mL/min/{1.73_m2} — ABNORMAL LOW (ref >60)
Glucose: 245 MG/DL — ABNORMAL HIGH (ref 65–100)
Potassium: 3.8 MMOL/L (ref 3.5–5.1)
Sodium: 134 MMOL/L — ABNORMAL LOW (ref 136–145)

## 2009-10-15 LAB — PROTHROMBIN TIME + INR
INR: 1.2 — ABNORMAL HIGH (ref 0.9–1.1)
Prothrombin time: 11.7 SECS — ABNORMAL HIGH (ref 9.0–11.0)

## 2009-10-15 LAB — CBC W/O DIFF
HCT: 28.7 % — ABNORMAL LOW (ref 36.6–50.3)
HGB: 9.5 g/dL — ABNORMAL LOW (ref 12.1–17.0)
MCH: 29 PG (ref 26.0–34.0)
MCHC: 33.1 g/dL (ref 30.0–36.5)
MCV: 87.5 FL (ref 80.0–99.0)
PLATELET: 227 10*3/uL (ref 150–400)
RBC: 3.28 M/uL — ABNORMAL LOW (ref 4.10–5.70)
RDW: 18.8 % — ABNORMAL HIGH (ref 11.5–14.5)
WBC: 5.2 10*3/uL (ref 4.1–11.1)

## 2009-10-15 LAB — PTT
aPTT: 57.9 s — ABNORMAL HIGH (ref 24.0–33.0)
aPTT: 77.4 s — ABNORMAL HIGH (ref 24.0–33.0)

## 2009-10-15 MED ORDER — POTASSIUM CHLORIDE SR 10 MEQ CAP
10 mEq | Freq: Every day | ORAL | Status: DC
Start: 2009-10-15 — End: 2009-10-15

## 2009-10-15 MED ORDER — ISOSORBIDE MONONITRATE SR 30 MG 24 HR TAB
30 mg | Freq: Every day | ORAL | Status: DC
Start: 2009-10-15 — End: 2009-10-17
  Administered 2009-10-16 – 2009-10-17 (×2): via ORAL

## 2009-10-15 MED ORDER — GLUCOSE 4 GRAM CHEWABLE TAB
4 gram | ORAL | Status: DC | PRN
Start: 2009-10-15 — End: 2009-10-17

## 2009-10-15 MED ORDER — LEVOTHYROXINE 150 MCG TAB
150 mcg | Freq: Every day | ORAL | Status: DC
Start: 2009-10-15 — End: 2009-10-17
  Administered 2009-10-16 – 2009-10-17 (×2): via ORAL

## 2009-10-15 MED ORDER — POTASSIUM CHLORIDE SR 10 MEQ TAB
10 mEq | Freq: Every day | ORAL | Status: DC
Start: 2009-10-15 — End: 2009-10-17
  Administered 2009-10-16 – 2009-10-17 (×2): via ORAL

## 2009-10-15 MED ORDER — GLUCAGON 1 MG INJECTION
1 mg | INTRAMUSCULAR | Status: DC | PRN
Start: 2009-10-15 — End: 2009-10-17

## 2009-10-15 MED ORDER — HEPARIN (PORCINE) 5,000 UNIT/ML IJ SOLN
5000 unit/mL | INTRAMUSCULAR | Status: DC | PRN
Start: 2009-10-15 — End: 2009-10-17

## 2009-10-15 MED ORDER — INSULIN ASPART 100 UNIT/ML INJECTION
100 unit/mL | Freq: Four times a day (QID) | SUBCUTANEOUS | Status: DC
Start: 2009-10-15 — End: 2009-10-15

## 2009-10-15 MED ORDER — LISINOPRIL 20 MG TAB
20 mg | Freq: Every day | ORAL | Status: DC
Start: 2009-10-15 — End: 2009-10-17
  Administered 2009-10-16 – 2009-10-17 (×2): via ORAL

## 2009-10-15 MED ORDER — DEXTROSE 50% IN WATER (D50W) IV SYRG
INTRAVENOUS | Status: DC | PRN
Start: 2009-10-15 — End: 2009-10-17

## 2009-10-15 MED ORDER — NITROGLYCERIN 0.4 MG SUBLINGUAL TAB
0.4 mg | SUBLINGUAL | Status: DC | PRN
Start: 2009-10-15 — End: 2009-10-17

## 2009-10-15 MED ORDER — BUMETANIDE 1 MG TAB
1 mg | Freq: Two times a day (BID) | ORAL | Status: DC
Start: 2009-10-15 — End: 2009-10-17
  Administered 2009-10-15 – 2009-10-17 (×3): via ORAL

## 2009-10-15 MED ORDER — INSULIN GLARGINE 100 UNIT/ML INJECTION
100 unit/mL | Freq: Every evening | SUBCUTANEOUS | Status: DC
Start: 2009-10-15 — End: 2009-10-17
  Administered 2009-10-16 – 2009-10-17 (×2): via SUBCUTANEOUS

## 2009-10-15 MED ORDER — FISH OIL-OMEGA-3 FATTY ACIDS 1,000 MG-340 MG CAP
340-1000 mg | Freq: Every day | ORAL | Status: DC
Start: 2009-10-15 — End: 2009-10-17
  Administered 2009-10-16 – 2009-10-17 (×2): via ORAL

## 2009-10-15 MED ORDER — INSULIN ASPART 100 UNIT/ML INJECTION
100 unit/mL | Freq: Four times a day (QID) | SUBCUTANEOUS | Status: DC
Start: 2009-10-15 — End: 2009-10-17
  Administered 2009-10-15 – 2009-10-17 (×5): via SUBCUTANEOUS

## 2009-10-15 MED ORDER — OMEGA-3 FATTY ACIDS-VITAMIN E 1,000 MG CAPSULE
1000 mg | Freq: Two times a day (BID) | ORAL | Status: DC
Start: 2009-10-15 — End: 2009-10-15

## 2009-10-15 MED ORDER — MISOPROSTOL 100 MCG TAB
100 mcg | Freq: Two times a day (BID) | ORAL | Status: DC
Start: 2009-10-15 — End: 2009-10-17
  Administered 2009-10-15 – 2009-10-17 (×3): via ORAL

## 2009-10-15 MED ORDER — HEPARIN (PORCINE) IN D5W 25,000 UNIT/250 ML IV
25000 unit/250 mL(100 unit/mL) | INTRAVENOUS | Status: AC
Start: 2009-10-15 — End: 2009-10-16
  Administered 2009-10-15 – 2009-10-16 (×2): via INTRAVENOUS

## 2009-10-15 MED ORDER — ASPIRIN 81 MG TAB
81 mg | Freq: Every day | ORAL | Status: DC
Start: 2009-10-15 — End: 2009-10-15

## 2009-10-15 MED ORDER — AMLODIPINE 5 MG TAB
5 mg | Freq: Every day | ORAL | Status: DC
Start: 2009-10-15 — End: 2009-10-17
  Administered 2009-10-16 – 2009-10-17 (×2): via ORAL

## 2009-10-15 MED ORDER — TERAZOSIN 5 MG CAP
5 mg | Freq: Every evening | ORAL | Status: DC
Start: 2009-10-15 — End: 2009-10-17
  Administered 2009-10-16 – 2009-10-17 (×2): via ORAL

## 2009-10-15 MED ORDER — SODIUM BICARBONATE 8.4 % IV
1 mEq/mL (8.4 %) | INTRAVENOUS | Status: DC
Start: 2009-10-15 — End: 2009-10-16
  Administered 2009-10-16: 09:00:00 via INTRAVENOUS

## 2009-10-15 MED ORDER — CLOPIDOGREL 75 MG TAB
75 mg | Freq: Every day | ORAL | Status: DC
Start: 2009-10-15 — End: 2009-10-17
  Administered 2009-10-16 – 2009-10-17 (×2): via ORAL

## 2009-10-15 MED ORDER — ASPIRIN 81 MG CHEWABLE TAB
81 mg | Freq: Every day | ORAL | Status: DC
Start: 2009-10-15 — End: 2009-10-17
  Administered 2009-10-16 – 2009-10-17 (×2): via ORAL

## 2009-10-15 MED ORDER — ROSUVASTATIN 10 MG TAB
10 mg | Freq: Every evening | ORAL | Status: DC
Start: 2009-10-15 — End: 2009-10-17
  Administered 2009-10-16 – 2009-10-17 (×2): via ORAL

## 2009-10-15 MED ORDER — METOPROLOL TARTRATE 25 MG TAB
25 mg | Freq: Two times a day (BID) | ORAL | Status: DC
Start: 2009-10-15 — End: 2009-10-17
  Administered 2009-10-16 – 2009-10-17 (×3): via ORAL

## 2009-10-15 MED FILL — BUMETANIDE 1 MG TAB: 1 mg | ORAL | Qty: 2

## 2009-10-15 MED FILL — BUMETANIDE 1 MG TAB: 1 mg | ORAL | Qty: 1

## 2009-10-15 MED FILL — LISINOPRIL 5 MG TAB: 5 mg | ORAL | Qty: 2

## 2009-10-15 MED FILL — LEVOTHYROXINE 150 MCG TAB: 150 mcg | ORAL | Qty: 1

## 2009-10-15 MED FILL — SODIUM BICARBONATE 8.4 % IV: 1 mEq/mL (8.4 %) | INTRAVENOUS | Qty: 150

## 2009-10-15 MED FILL — MISOPROSTOL 100 MCG TAB: 100 mcg | ORAL | Qty: 2

## 2009-10-15 MED FILL — FAMOTIDINE 20 MG TAB: 20 mg | ORAL | Qty: 1

## 2009-10-15 MED FILL — TERAZOSIN 5 MG CAP: 5 mg | ORAL | Qty: 2

## 2009-10-15 MED FILL — INSULIN NPH HUMAN RECOMB 100 UNIT/ML INJECTION: 100 unit/mL | SUBCUTANEOUS | Qty: 10

## 2009-10-15 MED FILL — ASPIRIN 81 MG CHEWABLE TAB: 81 mg | ORAL | Qty: 2

## 2009-10-15 MED FILL — PLAVIX 75 MG TABLET: 75 mg | ORAL | Qty: 1

## 2009-10-15 MED FILL — METOPROLOL TARTRATE 50 MG TAB: 50 mg | ORAL | Qty: 2

## 2009-10-15 MED FILL — INSULIN ASPART 100 UNIT/ML INJECTION: 100 unit/mL | SUBCUTANEOUS | Qty: 1

## 2009-10-15 MED FILL — DOK 100 MG CAPSULE: 100 mg | ORAL | Qty: 1

## 2009-10-15 MED FILL — CRESTOR 10 MG TABLET: 10 mg | ORAL | Qty: 2

## 2009-10-15 MED FILL — HEPARIN (PORCINE) IN D5W 25,000 UNIT/250 ML IV: 25000 unit/250 mL(100 unit/mL) | INTRAVENOUS | Qty: 250

## 2009-10-15 MED FILL — AMLODIPINE 5 MG TAB: 5 mg | ORAL | Qty: 2

## 2009-10-15 MED FILL — INSULIN NPH HUMAN RECOMB 100 UNIT/ML INJECTION: 100 unit/mL | SUBCUTANEOUS | Qty: 1

## 2009-10-15 MED FILL — INSULIN ASPART 100 UNIT/ML INJECTION: 100 unit/mL | SUBCUTANEOUS | Qty: 6

## 2009-10-15 NOTE — Progress Notes (Signed)
S: No chest pain overnight; feels OK - ruled in for MI with chest pain during the day   Physical Exam:   BP 154/58   Pulse 92   Temp 98.7 ??F (37.1 ??C)   Resp 18   Ht 5\' 6"  (1.676 m)   Wt 179 lb 9.6 oz (81.466 kg)   BMI 28.99 kg/m2   SpO2 98%   Patient appears generally well, mood and affect are appropriate and pleasant.   HEENT: Normocephalic, atraumatic.   Neck Exam: Supple,   Lung Exam: Clear to auscultation, even breath sounds.   Cardiac Exam: Regular rate and rhythm with 1-2/6 systolic murmur.   Abdomen: Soft, non-tender, non-distended, normal bowel sounds.   Extremities: mild lower extremity edema.   Vasc - poor fem pulse bilat; + bilat fem bruits    Recent Results (from the past 24 hour(s))   PTT    Collection Time    10/14/09 10:50 AM   Component Value Range   ??? aPTT 75.1 (*) 24.0 - 33.0 (sec)   TYPE & CROSSMATCH    Collection Time    10/14/09 12:30 PM   Component Value Range   ??? Crossmatch Expiration 10/17/2009     ??? ABO/Rh(D) O POS     ??? Antibody screen NEG     ??? Unit number [id number]     ??? Blood component type RC LRIR AS5     ??? Unit division 00     ??? Status of unit ISSUED     ??? Crossmatch result COMPATIBLE     GLUCOSE, POC    Collection Time    10/14/09 12:36 PM   Component Value Range   ??? POC GLUCOSE 296 (*) 75 - 110 (mg/dL)   GLUCOSE, POC    Collection Time    10/14/09  4:31 PM   Component Value Range   ??? POC GLUCOSE 65 (*) 75 - 110 (mg/dL)   GLUCOSE, POC    Collection Time    10/14/09  5:03 PM   Component Value Range   ??? POC GLUCOSE 98  75 - 110 (mg/dL)   GLUCOSE, POC    Collection Time    10/14/09  8:59 PM   Component Value Range   ??? POC GLUCOSE 349 (*) 75 - 110 (mg/dL)   GLUCOSE, POC    Collection Time    10/14/09  9:01 PM   Component Value Range   ??? POC GLUCOSE 289 (*) 75 - 110 (mg/dL)   PTT    Collection Time    10/15/09 12:53 AM   Component Value Range   ??? aPTT 57.9 (*) 24.0 - 33.0 (sec)   PTT    Collection Time    10/15/09  7:00 AM   Component Value Range   ??? aPTT 77.4 (*) 24.0 - 33.0 (sec)    CBC W/O DIFF    Collection Time    10/15/09  7:00 AM   Component Value Range   ??? WBC 5.2  4.1 - 11.1 (K/uL)   ??? RBC 3.28 (*) 4.10 - 5.70 (M/uL)   ??? HGB 9.5 (*) 12.1 - 17.0 (g/dL)   ??? HCT 28.7 (*) 36.6 - 50.3 (%)   ??? MCV 87.5  80.0 - 99.0 (FL)   ??? MCH 29.0  26.0 - 34.0 (PG)   ??? MCHC 33.1  30.0 - 36.5 (g/dL)   ??? RDW 18.8 (*) 11.5 - 14.5 (%)   ??? PLATELET 227  150 - 400 (K/uL)  METABOLIC PANEL, BASIC    Collection Time    10/15/09  7:00 AM   Component Value Range   ??? Sodium 134 (*) 136 - 145 (MMOL/L)   ??? Potassium 3.8  3.5 - 5.1 (MMOL/L)   ??? Chloride 99  97 - 108 (MMOL/L)   ??? CO2 29  21 - 32 (MMOL/L)   ??? Anion gap 6  5 - 15 (mmol/L)   ??? Glucose 245 (*) 65 - 100 (MG/DL)   ??? BUN 22 (*) 6 - 20 (MG/DL)   ??? Creatinine 1.4 (*) 0.6 - 1.3 (MG/DL)   ??? BUN/Creatinine ratio 16  12 - 20 ( )   ??? GFR est AA >60  >60 (ml/min/1.35m2)   ??? GFR est non-AA 53 (*) >60 (ml/min/1.68m2)   ??? Calcium 8.9  8.5 - 10.1 (MG/DL)   PROTHROMBIN TIME    Collection Time    10/15/09  7:00 AM   Component Value Range   ??? INR 1.2 (*) 0.9 - 1.1 ( )   ??? Prothrombin Time-PT 11.7 (*) 9.0 - 11.0 (SECS)   GLUCOSE, POC    Collection Time    10/15/09  7:40 AM   Component Value Range   ??? POC GLUCOSE 293 (*) 75 - 110 (mg/dL)       Assessment and Plan:   1) NSTEMI   - He has had 2 MI's the past couple of weeks.    - will plan to proceed with a cath, but because of his PVD would like to do so with arm access if possible. Radial access is not available at [facility] so patient will be transferred to [facility] where Dr. Orvil Feil will do the procedure - he has particular expertise in radial cases and as well as interventions for chronic lesions. 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 He's also had multiple recent cath's with total occlusion of the LCX,  His echo shows inf-lat HK and his resting myoview shows decreased perfusion in the inferolateral wall suggesting ischemia the LCK territory.  He is at high risk of LE vascular complications, with one occurring after his last cath.  - Plavix resistance testing has been sent and results pending    2) HTN - mildly elevated but stable; will slowly increase BP meds, however will have to watch for hypotension and ARF which was a problem a few weeks back     3) PVD with Angioplasty right common femoral and proximal superficial femoral artery angioplasty of proximal femoral-popliteal on 08/09/09   - Vascular surgery has requested 3 months of anti-coagulation post-procedure (until 11/09/09)   - Plan to continue heparin; hold coumadin until we complete the cath; After the cath he will need bridging anti-coagulation until INR therapeutic 2-3.     4) Anemia Multifactorial (including CKD) -   - Given his recent, recurrent MI's possibly from supply demand mismatch and anemia (Hgb ~ 8) we have transfused him during this admission (yesterday)  - Upper and lower endoscopy this admission has shown superficial ulcers. GI has recommended continued use of PPI with H2B therapy.      5) Chronic edema - stable; continue Bumex at a low dose; at higher doses of bumex in the past his edema has been better as an outpatient, however he was labile and proceeded to hypotension  and ARF    6) CKD - stable; consider mucomyst and NaBicarb gtt prior to cath. Will need to minimize dye use with the cath.     Tharon Aquas, MD, [facility]

## 2009-10-15 NOTE — H&P (Signed)
CARDIOLOGY HISTORY AND PHYSICAL    Date of  Admission: 10/15/2009 11:22 AM     Admission type:Urgent     Subjective:      Travis [name] is a 69 y.o. male admitted for Acute MI  Acute MI.Patient complains of  chest pain.  Several NSTEMI's in past few months with chronically subtotalled, difficult Cx lesion.  Previous complication with femoral cath resulting in closure of graft to leg.  Now transferred for cath/PCI via radial access.     Patient Active Problem List   Diagnoses Date Noted   ??? NSTEMI (non-ST elevated myocardial infarction) [410.70AF] 10/15/2009   ??? HTN (hypertension) [401.9AF] 10/13/2009   ??? Unspecified hypothyroidism [244.9] 10/13/2009   ??? PVD (peripheral vascular disease) [443.9AP] 10/06/2009   ??? Hyperlipidemia [272.4S] 10/06/2009   ??? DM renal manif type II [250.40] 10/05/2009   ??? Acute myocardial infarction, unspecified site, initial episode of care [410.91] 09/30/2009   ??? Chronic kidney disease, stage III (moderate) [585.3] 09/29/2009   ??? Anemia, unspecified [285.9] 09/29/2009   ??? Back pain [724.5E] 09/29/2009   ??? CHF (congestive heart failure) [428.0R]    ??? CAD (coronary artery disease) [414.00AE]         Lennon Alstrom III, MD  Past Medical History   Diagnosis Date   ??? CAD (coronary artery disease)      see problem list   ??? HTN (hypertension)    ??? PVD (peripheral vascular disease)    ??? DM (diabetes mellitus)    ??? Hyperlipidemia    ??? Sleep apnea      on CPAP   ??? COPD (chronic obstructive pulmonary disease)      mild COPD, recurrent pneumonia, chronic bronchitis (followed by Dr. Leroy Libman)   ??? CKD (chronic kidney disease)      Cr 1.7 in 10/09   ??? CHF (congestive heart failure)    ??? Anemia    ??? Pulmonary hypertension      PASP 60 on echo 06/22/09        History   Social History   ??? Marital Status: Single     Spouse Name: N/A     Number of Children: N/A   ??? Years of Education: N/A   Social History Main Topics   ??? Smoking status: Former Smoker -- 20 years     Types: Cigarettes    ??? Smokeless tobacco: Not on file   ??? Alcohol Use: No   ??? Drug Use: Not on file   ??? Sexually Active: Not on file   Other Topics Concern   ??? Not on file   Social History Narrative   ??? No narrative on file       No Known Allergies   Family History   Problem Relation Age of Onset   ??? Hypertension          No current facility-administered medications on file.     Facility-Administered Medications Ordered in Other Encounters   Medication Dose Route Frequency   ??? bumetanide (BUMEX) injection 1 mg  1 mg IntraVENous ONCE           Review of Symptoms:  Constitutional: negative  Eyes: negative  Ears, nose, mouth, throat, and face: negative  Respiratory: negative  Cardiovascular: negative  Gastrointestinal: negative  Genitourinary:negative  Musculoskeletal:negative  Neurological: negative  Behvioral/Psych: negative  Endocrine: negative     Objective:   Physical Exam:  General Appearance: ??NAD  Chest:?? ??Clear  Cardiovascular: ??Regular rate and rhythm, S1, S2  normal, no murmur.??  Abdomen:?? ??Soft, non-tender, bowel sounds are active.??  Extremities: normal Allen's test on R hand.  Skin:?? Warm and dry.??    Visit Vitals   Item Reading   ??? BP 159/68   ??? Pulse 72   ??? Temp 98.4 ??F (36.9 ??C)   ??? Resp 16   ??? Ht 5\' 6"  (1.676 m)   ??? Wt 181 lb (82.1 kg)   ??? BMI 29.21 kg/m2   ??? SpO2 100%         Cardiographics    Telemetry: normal sinus rhythm  ECG: normal EKG, normal sinus rhythm, unchanged from previous tracings  Echocardiogram: Not done      Labs:  Recent Labs   Basename 10/15/09 0700 10/14/09 0455 10/13/09 1000   ??? WBC 5.2 7.5 7.4   ??? HGB 9.5* 8.3* 8.7*   ??? HCT 28.7* 25.2* 26.3*   ??? PLT 227 237 247       Recent Labs   Basename 10/15/09 0700 10/14/09 0455 10/13/09 1000   ??? NA 134* 134* 138   ??? K 3.8 4.5 3.9   ??? CL 99 101 102   ??? CO2 29 25 28    ??? GLU 245* 340* 205*   ??? BUN 22* 19 18   ??? CREA 1.4* 1.3 1.2   ??? CA 8.9 8.2* 8.7   ??? MG -- 2.0 --   ??? PHOS -- 3.4 --   ??? ALB -- -- --   ??? TBIL -- -- --   ??? SGOT -- -- --   ??? INR 1.2* 1.3* 1.5*          Recent Labs   [facility] 10/13/09 0453 10/12/09 1426   ??? TROIQ 1.38* 2.81*   ??? CPK -- 181   ??? CKMB -- --            Assessment:     Assessment:       Patient Active Hospital Problem List:  *NSTEMI (non-ST elevated myocardial infarction) (10/15/2009)       Plan:      Cath/PCI in AM via R radial.

## 2009-10-15 NOTE — Progress Notes (Signed)
Patients is transferred by [facility] ambulance service to [facility].  Patient is on heparin drip, rate verified by Sidney Ace, RN.

## 2009-10-15 NOTE — Discharge Summary (Signed)
Physician Discharge Summary     Patient ID:  Travis [name]  [id number]  69 y.o.  September 20, 1940    Admit date: 09/29/2009    Discharge date and time: 10/15/2009    Admission Diagnoses: DKA (DIABETIC KETOACIDOSIS)  Iron Def. Anemia    Discharge Diagnoses:    Principal Diagnosis   CAD (coronary artery disease)                                             Other Diagnoses  CHF (congestive heart failure) ()  Chronic kidney disease, stage III (moderate) (09/29/2009)  Anemia, unspecified (09/29/2009)  Back pain (09/29/2009)  Acute myocardial infarction, unspecified site, initial episode of care (09/30/2009)  DM renal manif type II (10/05/2009)  PVD (peripheral vascular disease) (10/06/2009)  Hyperlipidemia (10/06/2009)  HTN (hypertension) (10/13/2009)  Unspecified hypothyroidism (10/13/2009)     Hospital Course:   CAD (coronary artery disease) - Cards is following this issue. Appears to have had another NSTEMI. Cards is planning cath, but it requires radial access. So patient will be transferred to [facility] to be seen by Dr Orvil Feil. Cath will be Monday. In the meantime, will continue usual meds per cards, including ASA and plavix.     CHF (congestive heart failure) - Preserved LV function , but will be examined again on cath. At high risk for worsening.     DM renal manif type II - Very labile, and per suggestion will make SSI scale high sensitivity.     Chronic kidney disease, stage III (moderate) - Stable for now. At risk due to DM and contrast dye. Needs hydration and mucomyst prior to cath.     HTN- Well controlled on Lisinopril, Norvasc and Metoprolol.     Anemia, unspecified - Follow hemoccult. Labs show B12, folate and iron okay. Taking EPO.     Back pain - Prn tylenol and percocet.     Acute myocardial infarction, unspecified site, initial episode of care - Cards managing issue as above.     PVD (peripheral vascular disease) - Will need to return to coumadin after cath.     Hyperlipidemia - Continue Crestor and fish oil.      Hypothyroidism- Continue synthroid. TSH was normal at 1.82.     PCP: Jeral Fruit, MD    Consults: cardiology    Significant Diagnostic Studies: See Hospital Course    Discharge Exam:  ???  BP  152/56    ???  Pulse  79    ???  Temp  98.1 ??F (36.7 ??C)    ???  Resp  18    ???  Ht  5\' 6"  (1.676 m)    ???  Wt  179 lb 9.6 oz    ???  BMI  28.99 kg/m2    ???  SpO2  98%      Gen: Well-developed, well-nourished, in no acute distress   HEENT: Pink conjunctivae, PERRL, hearing intact to voice, moist mucous membranes   Neck: Supple, without masses, thyroid non-tender   Resp: No accessory muscle use, clear breath sounds without wheezes rales or rhonchi   Card: No murmurs, normal S1, S2 without thrills, bruits or peripheral edema   Abd: Soft, non-tender, non-distended, normoactive bowel sounds are present, no palpable mass   Lymph: No cervical or inguinal adenopathy   Musc: No cyanosis or clubbing   Skin:  No rashes or ulcers, skin turgor is good   Neuro: Cranial nerves are grossly intact, no focal motor weakness, follows commands appropriately   Psych: Good insight, oriented to person, place and time, alert    [facility]    Patient Instructions:   Current Discharge Medication List      CONTINUE these medications which have NOT CHANGED       insulin glargine (LANTUS) 100 unit/mL injection    30 Units by SubCUTAneous route once.        potassium chloride SA (MICRO-K) 10 mEq capsule    Take 10 mEq by mouth daily.        amlodipine (NORVASC) 5 mg tablet    Take 1 Tab by mouth daily.    Qty: 30 Tab Refills: 11        misoprostol (CYTOTEC) 200 mcg tablet    Take 1 Tab by mouth two (2) times a day.    Qty: 60 Tab Refills: 3        bumetanide (BUMEX) 2 mg tablet    Take 2 mg by mouth two (2) times a day.        lisinopril (PRINIVIL, ZESTRIL) 20 mg tablet    Take  by mouth daily.        clopidogrel (PLAVIX) 75 mg tablet    Take  by mouth daily.        rosuvastatin (CRESTOR) 20 mg tablet    Take 20 mg by mouth daily.         terazosin (HYTRIN) 5 mg capsule    Take 10 mg by mouth nightly.    Associated Diagnoses: Edema        isosorbide mononitrate ER (IMDUR) 60 mg CR tablet    Take 1 Tab by mouth every morning.    Qty: 30 Tab Refills: 6        aspirin 81 mg tablet    Take  by mouth daily.        omega-3 fatty acids-vitamin e (FISH OIL) 1,000 mg Cap    Take  by mouth two (2) times a day.        levothyroxine (SYNTHROID) 150 mcg tablet    Take  by mouth daily (before breakfast).        METOPROLOL TARTRATE PO    Take 25 mg by mouth two (2) times a day.        nitroglycerin (NITROSTAT) 0.4 mg SL tablet    by SubLINGual route every five (5) minutes as needed.        insulin aspart (NOVOLOG) 100 unit/mL injection    by SubCUTAneous route.        ranitidine hcl (ZANTAC) 150 mg capsule    Take 150 mg by mouth two (2) times a day.            Activity: activity as tolerated  Diet: Cardiac Diet, Diabetic Diet and Low fat, Low cholesterol  Wound Care: None needed    Follow-up with PCP in a few weeks.  Follow-up tests/labs none    Signed:  Salvadore Dom, MD  10/15/2009  8:32 AM

## 2009-10-15 NOTE — Progress Notes (Signed)
BEDSIDE_VERBALshift change report given to Helyn Numbers, RN (oncoming nurse) by Christy Sartorius, RN(offgoing nurse).  Report given with SBAR, Kardex and ED Summary.

## 2009-10-15 NOTE — Progress Notes (Signed)
TRANSFER - OUT REPORT:    Verbal report given to Kyla Balzarine, RN (name) on Travis [name]  being transferred to [facility] for Catheterization.       Report consisted of patient???s Situation, Background, Assessment and   Recommendations(SBAR).     Information from the following report(s) SBAR, Kardex and MAR was reviewed with the receiving nurse.    Opportunity for questions and clarification was provided.

## 2009-10-15 NOTE — Progress Notes (Signed)
[facility]  [address] [place], [place] [address]  [phone]      Medical Progress Note      NAME: Travis [name]   DOB:  [birth year]  MRM:  [id number]    Date/Time: 10/15/2009  7:51 AM       Assessment and Plan:     CAD (coronary artery disease)- Cards is following this issue.  Appears to have had another NSTEMI. Cards is planning cath, but it requires radial access.  So patient will be transferred to [facility] to be seen by Dr Orvil Feil.  Cath will be Monday.  In the meantime, will continue usual meds per cards, including ASA and plavix.    CHF (congestive heart failure)- Preserved LV function, but will be examined again on cath.  At high risk for worsening.    DM renal manif type II- Very labile, and per suggestion will make SSI scale high sensitivity.    Chronic kidney disease, stage III (moderate)- Stable for now.  At risk due to DM and contrast dye.  Needs hydration and mucomyst prior to cath.    HTN- Well controlled on Lisinopril, Norvasc and Metoprolol.    Anemia, unspecified- Follow hemoccult.  Labs show B12, folate and iron okay.  Taking EPO.      Back pain- Prn tylenol and percocet.    Acute myocardial infarction, unspecified site, initial episode of care- Cards managing issue as above.    PVD (peripheral vascular disease)- Will need to return to coumadin after cath.    Hyperlipidemia- Continue Crestor and fish oil.    Hypothyroidism- Continue synthroid. TSH was normal at 1.82.         Subjective:     Chief Complaint:  Chest pain yesterday is gone.  "Just want to get out of here"    ROS:  (bold if positive, if negative)      Tolerating PT  Tolerating Diet        Objective:     Last 24hrs VS reviewed since prior progress note. Most recent are:    Visit Vitals   Item Reading   ??? BP 152/56   ??? Pulse 79   ??? Temp 98.1 ??F (36.7 ??C)   ??? Resp 18   ??? Ht 5\' 6"  (1.676 m)   ??? Wt 179 lb 9.6 oz   ??? BMI 28.99 kg/m2   ??? SpO2 98%       SpO2 Readings from Last 6 Encounters:    10/15/09 98%   10/15/09 98%   03/31/09 94%      O2 Flow Rate (L/min): 2 l/min     Intake/Output Summary (Last 24 hours) at 10/15/09 2130  Last data filed at 10/15/09 0645   Gross per 24 hour   Intake 2945.97 ml   Output   2930 ml   Net  15.97 ml        Physical Exam:    Gen:  Well-developed, well-nourished, in no acute distress  HEENT:  Pink conjunctivae, PERRL, hearing intact to voice, moist mucous membranes  Neck:  Supple, without masses, thyroid non-tender  Resp:  No accessory muscle use, clear breath sounds without wheezes rales or rhonchi  Card:  No murmurs, normal S1, S2 without thrills, bruits or peripheral edema  Abd:  Soft, non-tender, non-distended, normoactive bowel sounds are present, no palpable mass  Lymph:  No cervical or inguinal adenopathy  Musc:  No cyanosis or clubbing  Skin:  No rashes  or ulcers, skin turgor is good  Neuro:  Cranial nerves are grossly intact, no focal motor weakness, follows commands appropriately  Psych:  Good insight, oriented to person, place and time, alert    Telemetry reviewed:   normal sinus rhythm  __________________________________________________________________  Medications Reviewed: (see below)  Medications:     Current facility-administered medications   Medication Dose Route Frequency   ??? bumetanide (BUMEX) injection 1 mg  1 mg IntraVENous ONCE   ??? famotidine (PEPCID) tablet 20 mg  20 mg Oral DAILY   ??? insulin NPH (NOVOLIN, HUMULIN) injection 16 Units  16 Units SubCUTAneous ACB   ??? insulin NPH (NOVOLIN, HUMULIN) injection 10 Units  10 Units SubCUTAneous QHS   ??? insulin aspart (NOVOLOG) injection 5 Units  5 Units SubCUTAneous TIDAC   ??? insulin aspart (NOVOLOG)   SubCUTAneous AC&HS   ??? lisinopril (PRINIVIL, ZESTRIL) tablet 10 mg  10 mg Oral DAILY   ??? bumetanide (BUMEX) tablet 1 mg  1 mg Oral DAILY   ??? amlodipine (NORVASC) tablet 10 mg  10 mg Oral DAILY   ??? acetylcysteine (MUCOMYST) 20 % (200 mg/mL) solution 1,200 mg  1,200 mg Oral BID    ??? heparin 25,000 units in D5W 250 ml infusion  12-25 Units/kg/hr IntraVENous TITRATE   ??? metoprolol (LOPRESSOR) tablet 100 mg  100 mg Oral BID   ??? pantoprazole (PROTONIX) tablet 40 mg  40 mg Oral ACD   ??? epoetin alfa (EPOGEN;PROCRIT) injection 10,000 Units  10,000 Units SubCUTAneous Q7D   ??? aspirin chewable tablet 162 mg  162 mg Oral DAILY   ??? heparin (porcine) injection 4,000 Units  4,000 Units IntraVENous PRN   ??? heparin (porcine) injection 2,000 Units  2,000 Units IntraVENous PRN   ??? dextrose (D50W) injection Syrg 25 g  25 g IntraVENous PRN   ??? clopidogrel (PLAVIX) tablet 75 mg  75 mg Oral DAILY   ??? levothyroxine (SYNTHROID) tablet 150 mcg  150 mcg Oral ACB   ??? nitroglycerin (NITROSTAT) tablet 0.4 mg  0.4 mg SubLINGual Q5MIN PRN   ??? fish oil-omega-3 fatty acids 340-1,000 mg capsule 2 Cap  2 Cap Oral BID   ??? rosuvastatin (CRESTOR) tablet 20 mg  20 mg Oral DAILY   ??? terazosin (HYTRIN) capsule 10 mg  10 mg Oral QHS   ??? docusate sodium (COLACE) capsule 100 mg  100 mg Oral BID   ??? bisacodyl (DULCOLAX) tablet 5 mg  5 mg Oral DAILY PRN   ??? bisacodyl (DULCOLAX) suppository 10 mg  10 mg Rectal DAILY PRN   ??? oxycodone-acetaminophen (PERCOCET) 5-325 mg per tablet 2 Tab  2 Tab Oral Q6H PRN   ??? morphine injection 2 mg  2 mg IntraVENous Q4H PRN   ??? glucose chewable tablet 12 g  12 g Oral PRN   ??? glucagon (GLUCAGEN) injection 1 mg  1 mg IntraMUSCular PRN          Lab Data Reviewed: (see below)  Lab Review:     Recent Labs   Basename 10/14/09 0455 10/13/09 1000   ??? WBC 7.5 7.4   ??? HGB 8.3* 8.7*   ??? HCT 25.2* 26.3*   ??? PLT 237 247       Recent Labs   Basename 10/14/09 0455 10/13/09 1000 10/13/09 0453   ??? NA 134* 138 --   ??? K 4.5 3.9 --   ??? CL 101 102 --   ??? CO2 25 28 --   ??? GLU 340* 205* --   ???  BUN 19 18 --   ??? CREA 1.3 1.2 --   ??? CA 8.2* 8.7 --   ??? MG 2.0 -- --   ??? PHOS 3.4 -- --   ??? ALB -- -- --   ??? TBIL -- -- --   ??? SGOT -- -- --   ??? INR 1.3* 1.5* 1.6*       Lab Results   Component Value Date/Time     POC GLUCOSE 289 10/14/09 09:01 PM    POC GLUCOSE 349 10/14/09 08:59 PM    POC GLUCOSE 98 10/14/09 05:03 PM    POC GLUCOSE 65 10/14/09 04:31 PM    POC GLUCOSE 296 10/14/09 12:36 PM         No results found for this basename: PH:3,PCO2:3,PO2:3,HCO3:3,FIO2:3 in the last 72 hours  Recent Labs   Basename 10/14/09 0455 10/13/09 1000 10/13/09 0453   ??? INR 1.3* 1.5* 1.6*         Other pertinent lab: Elevated troponin.    Total time spent with patient: 108 Minutes                  Care Plan discussed with: Patient, Nursing Staff and >50% of time spent in counseling and coordination of care    Discussed:  Care Plan and D/C Planning    Prophylaxis:  Coumadin and PPI, hepatine drip off today.    Disposition:  Home w/Family           ___________________________________________________    Attending Physician: Salvadore Dom, MD

## 2009-10-15 NOTE — Progress Notes (Signed)
Dr Orvil Feil paged.

## 2009-10-15 NOTE — Progress Notes (Signed)
Problem: Cath Lab Procedures: Pre-Procedure  Goal: Nutrition  Outcome: Progressing Towards Goal  Explained to patient that he will be NPO after midnight. Patient verbalized understanding.  Goal: Treatments/Interventions/Procedures  Outcome: Progressing Towards Goal  Patient currently has IV in right wrist.  Will have night shift nurse obtain new IV site when getting AM labs.  Goal: Psychosocial Needs  Outcome: Progressing Towards Goal  Patient reports no anxiety at this time.   Goal: *Consent signed  Outcome: Progressing Towards Goal  Patient states, "I already signed something." Will go through transfer paperwork, and have new consent signed if needed.

## 2009-10-15 NOTE — Progress Notes (Signed)
Patient has no orders.  Dr Orvil Feil paged.

## 2009-10-15 NOTE — Progress Notes (Signed)
Positive Allen's test.

## 2009-10-16 LAB — POC ACTIVATED CLOTTING TIME: Activated Clotting Time (POC): 459 SECS — ABNORMAL HIGH (ref 79–138)

## 2009-10-16 LAB — GLUCOSE, POC
Glucose (POC): 122 mg/dL — ABNORMAL HIGH (ref 75–110)
Glucose (POC): 88 mg/dL (ref 75–110)
Glucose (POC): 69 mg/dL — ABNORMAL LOW (ref 75–110)
Glucose (POC): 101 mg/dL (ref 75–110)
Glucose (POC): 228 mg/dL — ABNORMAL HIGH (ref 75–110)
Glucose (POC): 308 mg/dL — ABNORMAL HIGH (ref 75–110)

## 2009-10-16 LAB — PTT: aPTT: 55.7 s — ABNORMAL HIGH (ref 24.0–33.0)

## 2009-10-16 MED ORDER — MIDAZOLAM 1 MG/ML IJ SOLN
1 mg/mL | INTRAMUSCULAR | Status: DC | PRN
Start: 2009-10-16 — End: 2009-10-16
  Administered 2009-10-16 (×8): via INTRAVENOUS

## 2009-10-16 MED ORDER — HEPARIN (PORCINE) IN NS (PF) 1,000 UNIT/500 ML IV
1000 unit/500 mL | Freq: Once | INTRAVENOUS | Status: AC
Start: 2009-10-16 — End: 2009-10-16
  Administered 2009-10-16: 12:00:00

## 2009-10-16 MED ORDER — SODIUM CHLORIDE 0.9 % IV
INTRAVENOUS | Status: DC
Start: 2009-10-16 — End: 2009-10-16

## 2009-10-16 MED ORDER — IOVERSOL 350 MG/ML IV SOLN
350 mg iodine/mL | INTRAVENOUS | Status: AC
Start: 2009-10-16 — End: 2009-10-16
  Administered 2009-10-16: 14:00:00

## 2009-10-16 MED ORDER — SODIUM CHLORIDE 0.9 % IV
INTRAVENOUS | Status: DC
Start: 2009-10-16 — End: 2009-10-16
  Administered 2009-10-16: 16:00:00 via INTRAVENOUS

## 2009-10-16 MED ORDER — FENTANYL CITRATE (PF) 50 MCG/ML IJ SOLN
50 mcg/mL | INTRAMUSCULAR | Status: DC | PRN
Start: 2009-10-16 — End: 2009-10-16
  Administered 2009-10-16 (×2): via INTRAVENOUS

## 2009-10-16 MED ORDER — BIVALIRUDIN 250 MG SOLUTION
250 mg | INTRAVENOUS | Status: DC
Start: 2009-10-16 — End: 2009-10-16
  Administered 2009-10-16 (×2): via INTRAVENOUS

## 2009-10-16 MED ORDER — DIPHENHYDRAMINE HCL 50 MG/ML IJ SOLN
50 mg/mL | Freq: Once | INTRAMUSCULAR | Status: AC
Start: 2009-10-16 — End: 2009-10-16
  Administered 2009-10-16: 12:00:00 via INTRAVENOUS

## 2009-10-16 MED ORDER — HEPARIN (PORCINE) 1,000 UNIT/ML IJ SOLN
1000 unit/mL | Freq: Once | INTRAMUSCULAR | Status: AC
Start: 2009-10-16 — End: 2009-10-16
  Administered 2009-10-16: 13:00:00 via INTRAVENOUS

## 2009-10-16 MED ORDER — LIDOCAINE (PF) 10 MG/ML (1 %) IJ SOLN
10 mg/mL (1 %) | INTRAMUSCULAR | Status: DC | PRN
Start: 2009-10-16 — End: 2009-10-16
  Administered 2009-10-16: 12:00:00 via INTRADERMAL

## 2009-10-16 MED ORDER — IOVERSOL 350 MG/ML IV SOLN
350 mg iodine/mL | INTRAVENOUS | Status: AC
Start: 2009-10-16 — End: 2009-10-16
  Administered 2009-10-16: 13:00:00

## 2009-10-16 MED ORDER — HEPARIN (PORCINE) IN NS (PF) 1,000 UNIT/500 ML IV
1000 unit/500 mL | Freq: Once | INTRAVENOUS | Status: AC
Start: 2009-10-16 — End: 2009-10-16
  Administered 2009-10-16: 12:00:00 via INTRA_ARTERIAL

## 2009-10-16 MED ORDER — ACETAMINOPHEN 325 MG TABLET
325 mg | ORAL | Status: DC | PRN
Start: 2009-10-16 — End: 2009-10-17

## 2009-10-16 MED ORDER — NITROGLYCERIN 0.1 MG/ML (100 MCG/ML) D5W COMPOUNDED INJECTION
0.1 mg/mL | INTRAVENOUS | Status: DC | PRN
Start: 2009-10-16 — End: 2009-10-16
  Administered 2009-10-16 (×5): via INTRACORONARY

## 2009-10-16 MED ORDER — METOPROLOL TARTRATE 5 MG/5 ML IV SOLN
5 mg/ mL | Freq: Once | INTRAVENOUS | Status: AC
Start: 2009-10-16 — End: 2009-10-16

## 2009-10-16 MED ORDER — IOVERSOL 350 MG/ML IV SOLN
350 mg iodine/mL | INTRAVENOUS | Status: DC | PRN
Start: 2009-10-16 — End: 2009-10-16
  Administered 2009-10-16: 14:00:00 via INTRA_ARTERIAL

## 2009-10-16 MED ORDER — NITROGLYCERIN 0.1 MG/ML (100 MCG/ML) D5W COMPOUNDED INJECTION
0.1 mg/mL | Freq: Once | INTRAVENOUS | Status: AC
Start: 2009-10-16 — End: 2009-10-16
  Administered 2009-10-16: 12:00:00 via INTRA_ARTERIAL

## 2009-10-16 MED ORDER — ZOLPIDEM 5 MG TAB
5 mg | Freq: Every evening | ORAL | Status: DC | PRN
Start: 2009-10-16 — End: 2009-10-17

## 2009-10-16 MED ORDER — BIVALIRUDIN 250 MG SOLUTION
250 mg | INTRAVENOUS | Status: DC
Start: 2009-10-16 — End: 2009-10-16

## 2009-10-16 MED ORDER — WARFARIN 5 MG TAB
5 mg | Freq: Once | ORAL | Status: AC
Start: 2009-10-16 — End: 2009-10-16
  Administered 2009-10-16: 17:00:00 via ORAL

## 2009-10-16 MED ORDER — VERAPAMIL 2.5 MG/ML IV
2.5 mg/mL | Freq: Once | INTRAVENOUS | Status: AC
Start: 2009-10-16 — End: 2009-10-16
  Administered 2009-10-16: 13:00:00 via INTRA_ARTERIAL

## 2009-10-16 MED ORDER — SODIUM CHLORIDE 0.9 % IV
INTRAVENOUS | Status: AC
Start: 2009-10-16 — End: 2009-10-16
  Administered 2009-10-16: 18:00:00 via INTRAVENOUS

## 2009-10-16 MED ORDER — METOPROLOL TARTRATE 5 MG/5 ML IV SOLN
5 mg/ mL | INTRAVENOUS | Status: AC
Start: 2009-10-16 — End: 2009-10-16
  Administered 2009-10-16: 15:00:00

## 2009-10-16 MED FILL — INSULIN ASPART 100 UNIT/ML INJECTION: 100 unit/mL | SUBCUTANEOUS | Qty: 1

## 2009-10-16 MED FILL — BUMETANIDE 1 MG TAB: 1 mg | ORAL | Qty: 2

## 2009-10-16 MED FILL — MIDAZOLAM 1 MG/ML IJ SOLN: 1 mg/mL | INTRAMUSCULAR | Qty: 2

## 2009-10-16 MED FILL — HEPARIN (PORCINE) IN NS (PF) 1,000 UNIT/500 ML IV: 1000 unit/500 mL | INTRAVENOUS | Qty: 500

## 2009-10-16 MED FILL — OPTIRAY 350 MG IODINE/ML INTRAVENOUS SOLUTION: 350 mg iodine/mL | INTRAVENOUS | Qty: 50

## 2009-10-16 MED FILL — HEPARIN (PORCINE) 1,000 UNIT/ML IJ SOLN: 1000 unit/mL | INTRAMUSCULAR | Qty: 10

## 2009-10-16 MED FILL — ASPIRIN 81 MG CHEWABLE TAB: 81 mg | ORAL | Qty: 1

## 2009-10-16 MED FILL — SYNTHROID 150 MCG TABLET: 150 mcg | ORAL | Qty: 1

## 2009-10-16 MED FILL — ANGIOMAX 250 MG INTRAVENOUS POWDER FOR SOLUTION: 250 mg | INTRAVENOUS | Qty: 5

## 2009-10-16 MED FILL — PLAVIX 75 MG TABLET: 75 mg | ORAL | Qty: 1

## 2009-10-16 MED FILL — NITROGLYCERIN 0.1 MG/ML (100 MCG/ML) D5W COMPOUNDED INJECTION: 0.1 mg/mL | INTRAVENOUS | Qty: 10

## 2009-10-16 MED FILL — FENTANYL CITRATE (PF) 50 MCG/ML IJ SOLN: 50 mcg/mL | INTRAMUSCULAR | Qty: 2

## 2009-10-16 MED FILL — SODIUM CHLORIDE 0.9 % IV: INTRAVENOUS | Qty: 50

## 2009-10-16 MED FILL — OPTIRAY 350 MG IODINE/ML INTRAVENOUS SOLUTION: 350 mg iodine/mL | INTRAVENOUS | Qty: 150

## 2009-10-16 MED FILL — SODIUM BICARBONATE 8.4 % IV: 1 mEq/mL (8.4 %) | INTRAVENOUS | Qty: 150

## 2009-10-16 MED FILL — JANTOVEN 5 MG TABLET: 5 mg | ORAL | Qty: 2

## 2009-10-16 MED FILL — OPTIRAY 350 MG IODINE/ML INTRAVENOUS SOLUTION: 350 mg iodine/mL | INTRAVENOUS | Qty: 300

## 2009-10-16 MED FILL — METOPROLOL TARTRATE 5 MG/5 ML IV SOLN: 5 mg/ mL | INTRAVENOUS | Qty: 5

## 2009-10-16 MED FILL — MISOPROSTOL 100 MCG TAB: 100 mcg | ORAL | Qty: 2

## 2009-10-16 MED FILL — FISH OIL 340 MG-1,000 MG CAPSULE: 340-1000 mg | ORAL | Qty: 1

## 2009-10-16 MED FILL — CRESTOR 10 MG TABLET: 10 mg | ORAL | Qty: 2

## 2009-10-16 MED FILL — SODIUM CHLORIDE 0.9 % IV: INTRAVENOUS | Qty: 1000

## 2009-10-16 MED FILL — LISINOPRIL 20 MG TAB: 20 mg | ORAL | Qty: 1

## 2009-10-16 MED FILL — TERAZOSIN 5 MG CAP: 5 mg | ORAL | Qty: 2

## 2009-10-16 MED FILL — LIDOCAINE (PF) 10 MG/ML (1 %) IJ SOLN: 10 mg/mL (1 %) | INTRAMUSCULAR | Qty: 30

## 2009-10-16 MED FILL — AMLODIPINE 5 MG TAB: 5 mg | ORAL | Qty: 1

## 2009-10-16 MED FILL — VERAPAMIL 2.5 MG/ML IV: 2.5 mg/mL | INTRAVENOUS | Qty: 2

## 2009-10-16 MED FILL — K-TAB 10 MEQ TABLET,EXTENDED RELEASE: 10 mEq | ORAL | Qty: 1

## 2009-10-16 MED FILL — METOPROLOL TARTRATE 25 MG TAB: 25 mg | ORAL | Qty: 1

## 2009-10-16 MED FILL — DIPHENHYDRAMINE HCL 50 MG/ML IJ SOLN: 50 mg/mL | INTRAMUSCULAR | Qty: 1

## 2009-10-16 MED FILL — INSULIN GLARGINE 100 UNIT/ML INJECTION: 100 unit/mL | SUBCUTANEOUS | Qty: 1

## 2009-10-16 MED FILL — ISOSORBIDE MONONITRATE SR 30 MG 24 HR TAB: 30 mg | ORAL | Qty: 2

## 2009-10-16 NOTE — Progress Notes (Signed)
Pt is very hard stick.  On heparin drip & needs IV transferred to left arm.  IV infusion of Sodium Bicarb in sterile water to be started at 4am.  Verified with Pharmacy that The Sodium bicarb & Heparin were compatible & could be run through the same IV.

## 2009-10-16 NOTE — Progress Notes (Signed)
TRANSFER - IN REPORT:    Verbal report received from [facility]) on Rande Lawman  being received from cath lab(unit) for routine progression of care      Report consisted of patient???s Situation, Background, Assessment and   Recommendations(SBAR).     Information from the following report(s) SBAR, Procedure Summary and MAR was reviewed with the receiving nurse.    Opportunity for questions and clarification was provided.      Assessment completed upon patient???s arrival to unit and care assumed.

## 2009-10-16 NOTE — Procedures (Signed)
New 85% ulcerated plaque mid RCA successfully stented with 2.75 Xience.    CTO Cx successful PTCA and DES of proximal part with 2.25 Atom.      Final on both:  TIMI 3 flow; no complications.      R radial.  Angiomax.

## 2009-10-16 NOTE — Progress Notes (Signed)
Bedside and Verbal shift change report given to Ardelle Anton RN (oncoming nurse) by Nonie Hoyer RN (offgoing nurse).  Report given with SBAR, Kardex, Procedure Summary, Intake/Output and MAR.

## 2009-10-16 NOTE — Progress Notes (Signed)
TRANSFER - IN REPORT:    Verbal report received from Marene Lenz RN(name) on Travis [name]  being received from [facility](unit) for routine progression of care      Report consisted of patient???s Situation, Background, Assessment and   Recommendations(SBAR).     Information from the following report(s) SBAR, Procedure Summary and MAR was reviewed with the receiving nurse.    Opportunity for questions and clarification was provided.      Assessment completed upon patient???s arrival to unit and care assumed.

## 2009-10-16 NOTE — Progress Notes (Signed)
TRANSFER - OUT REPORT:    Verbal report given to Rashan, R.N. (name) on TRACER GUTRIDGE  being transferred to [facility](unit) for routine progression of care       Report consisted of patient???s Situation, Background, Assessment and   Recommendations(SBAR).     Information from the following report(s) Procedure Summary and MAR was reviewed with the receiving nurse.    Opportunity for questions and clarification was provided.

## 2009-10-17 LAB — METABOLIC PANEL, BASIC
Anion gap: 8 mmol/L (ref 5–15)
BUN/Creatinine ratio: 16 (ref 12–20)
BUN: 27 MG/DL — ABNORMAL HIGH (ref 6–20)
CO2: 31 MMOL/L (ref 21–32)
Calcium: 8.3 MG/DL — ABNORMAL LOW (ref 8.5–10.1)
Chloride: 98 MMOL/L (ref 97–108)
Creatinine: 1.7 MG/DL — ABNORMAL HIGH (ref 0.6–1.3)
GFR est AA: 52 mL/min/{1.73_m2} — ABNORMAL LOW (ref >60)
GFR est non-AA: 43 mL/min/{1.73_m2} — ABNORMAL LOW (ref >60)
Glucose: 39 MG/DL — CL (ref 65–100)
Potassium: 3.6 MMOL/L (ref 3.5–5.1)
Sodium: 137 MMOL/L (ref 136–145)

## 2009-10-17 LAB — CBC WITH AUTOMATED DIFF
ABS. BASOPHILS: 0 10*3/uL (ref 0.0–0.1)
ABS. EOSINOPHILS: 0.1 10*3/uL (ref 0.0–0.4)
ABS. LYMPHOCYTES: 1.4 10*3/uL (ref 0.8–3.5)
ABS. MONOCYTES: 0.6 10*3/uL (ref 0.0–1.0)
ABS. NEUTROPHILS: 3 10*3/uL (ref 1.8–8.0)
BASOPHILS: 0 % (ref 0–1)
EOSINOPHILS: 2 % (ref 0–7)
HCT: 26.6 % — ABNORMAL LOW (ref 36.6–50.3)
HGB: 8.5 g/dL — ABNORMAL LOW (ref 12.1–17.0)
LYMPHOCYTES: 28 % (ref 12–49)
MCH: 28.1 PG (ref 26.0–34.0)
MCHC: 32 g/dL (ref 30.0–36.5)
MCV: 87.8 FL (ref 80.0–99.0)
MONOCYTES: 12 % (ref 5–13)
NEUTROPHILS: 58 % (ref 32–75)
PLATELET: 187 10*3/uL (ref 150–400)
RBC: 3.03 M/uL — ABNORMAL LOW (ref 4.10–5.70)
RDW: 18.2 % — ABNORMAL HIGH (ref 11.5–14.5)
WBC: 5.2 10*3/uL (ref 4.1–11.1)

## 2009-10-17 LAB — EKG, 12 LEAD, INITIAL
Atrial Rate: 78 {beats}/min
Atrial Rate: 83 {beats}/min
Calculated P Axis: 49 degrees
Calculated P Axis: 52 degrees
Calculated R Axis: 62 degrees
Calculated R Axis: 76 degrees
Calculated T Axis: 39 degrees
Calculated T Axis: 71 degrees
P-R Interval: 210 ms
P-R Interval: 210 ms
Q-T Interval: 484 ms
Q-T Interval: 508 ms
QRS Duration: 174 ms
QRS Duration: 180 ms
QTC Calculation (Bezet): 568 ms
QTC Calculation (Bezet): 579 ms
Ventricular Rate: 78 {beats}/min
Ventricular Rate: 83 {beats}/min

## 2009-10-17 LAB — GLUCOSE, POC
Glucose (POC): 338 mg/dL — ABNORMAL HIGH (ref 75–110)
Glucose (POC): 52 mg/dL — ABNORMAL LOW (ref 75–110)
Glucose (POC): 58 mg/dL — ABNORMAL LOW (ref 75–110)
Glucose (POC): 120 mg/dL — ABNORMAL HIGH (ref 75–110)
Glucose (POC): 166 mg/dL — ABNORMAL HIGH (ref 75–110)
Glucose (POC): 408 mg/dL — ABNORMAL HIGH (ref 75–110)
Glucose (POC): 408 mg/dL — ABNORMAL HIGH (ref 75–110)

## 2009-10-17 LAB — PROTHROMBIN TIME + INR
INR: 1.2 — ABNORMAL HIGH (ref 0.9–1.1)
Prothrombin time: 11.9 SECS — ABNORMAL HIGH (ref 9.0–11.0)

## 2009-10-17 MED ORDER — WARFARIN 5 MG TAB
5 mg | Freq: Once | ORAL | Status: AC
Start: 2009-10-17 — End: 2009-10-17
  Administered 2009-10-17: 14:00:00 via ORAL

## 2009-10-17 MED ORDER — ENOXAPARIN 80 MG/0.8 ML SUB-Q SYRINGE
80 mg/0.8 mL | INJECTION | Freq: Two times a day (BID) | SUBCUTANEOUS | Status: DC
Start: 2009-10-17 — End: 2009-10-20

## 2009-10-17 MED ORDER — ENOXAPARIN 40 MG/0.4 ML SUB-Q SYRINGE
40 mg/0.4 mL | Freq: Two times a day (BID) | SUBCUTANEOUS | Status: DC
Start: 2009-10-17 — End: 2009-10-17
  Administered 2009-10-17: 14:00:00 via SUBCUTANEOUS

## 2009-10-17 MED ORDER — ENOXAPARIN 80 MG/0.8 ML SUB-Q SYRINGE
80 mg/0.8 mL | INJECTION | Freq: Two times a day (BID) | SUBCUTANEOUS | Status: DC
Start: 2009-10-17 — End: 2009-10-17

## 2009-10-17 MED ORDER — WARFARIN 7.5 MG TAB
7.5 mg | ORAL_TABLET | Freq: Every day | ORAL | Status: DC
Start: 2009-10-17 — End: 2009-10-20

## 2009-10-17 MED FILL — INSULIN ASPART 100 UNIT/ML INJECTION: 100 unit/mL | SUBCUTANEOUS | Qty: 1

## 2009-10-17 MED FILL — TERAZOSIN 5 MG CAP: 5 mg | ORAL | Qty: 2

## 2009-10-17 MED FILL — ASPIRIN 81 MG CHEWABLE TAB: 81 mg | ORAL | Qty: 1

## 2009-10-17 MED FILL — LISINOPRIL 20 MG TAB: 20 mg | ORAL | Qty: 1

## 2009-10-17 MED FILL — BUMETANIDE 1 MG TAB: 1 mg | ORAL | Qty: 2

## 2009-10-17 MED FILL — CRESTOR 10 MG TABLET: 10 mg | ORAL | Qty: 2

## 2009-10-17 MED FILL — MISOPROSTOL 100 MCG TAB: 100 mcg | ORAL | Qty: 2

## 2009-10-17 MED FILL — INSULIN GLARGINE 100 UNIT/ML INJECTION: 100 unit/mL | SUBCUTANEOUS | Qty: 1

## 2009-10-17 MED FILL — AMLODIPINE 5 MG TAB: 5 mg | ORAL | Qty: 1

## 2009-10-17 MED FILL — PLAVIX 75 MG TABLET: 75 mg | ORAL | Qty: 1

## 2009-10-17 MED FILL — K-TAB 10 MEQ TABLET,EXTENDED RELEASE: 10 mEq | ORAL | Qty: 1

## 2009-10-17 MED FILL — ISOSORBIDE MONONITRATE SR 30 MG 24 HR TAB: 30 mg | ORAL | Qty: 2

## 2009-10-17 MED FILL — FISH OIL 340 MG-1,000 MG CAPSULE: 340-1000 mg | ORAL | Qty: 1

## 2009-10-17 MED FILL — METOPROLOL TARTRATE 25 MG TAB: 25 mg | ORAL | Qty: 1

## 2009-10-17 MED FILL — JANTOVEN 5 MG TABLET: 5 mg | ORAL | Qty: 2

## 2009-10-17 MED FILL — LOVENOX 40 MG/0.4 ML SUBCUTANEOUS SYRINGE: 40 mg/0.4 mL | SUBCUTANEOUS | Qty: 0.8

## 2009-10-17 MED FILL — SYNTHROID 150 MCG TABLET: 150 mcg | ORAL | Qty: 1

## 2009-10-17 NOTE — Other (Signed)
Cardiac Multidisciplinary Rounds   10/17/2009           Patient Information:  NAME:   Travis [name]         AGE:   69 y.o.        ADMISSION DATE:   10/15/2009          ADMITTING PHYSICIAN:   Victory Dakin, MD         *CARDIOLOGIST:             ________________________________________________________________________  ________________________________________________________________________          Admitting Diagnosis: Acute MI  Acute MI          __________________________________________________________________   __________________________________________________________________    EF:    60-65      %   (Date EF Last Documented):  Per Echo 09/30/09     ___________________________________________________________________  ___________________________________________________________________      Cardiac Procedure:     Cath - stent to RCA and circ  Date:           10/16/09      ___________________________________________________________________  ___________________________________________________________________    CORE MEASURE MEDICATIONS :     (include dosage with med as indicated below)      *ACE/ARB if EF <40%:   Lisinopril 20mg  daily                                        If none, has MD documented why?         *Beta Blocker :    Metoprolol 25mg  BID                            If none, has MD documented why?          ASA :    81mg  daily    *ASA on arrival ( if AMI ) :                                              If AMI and none given,has MD documented why?          Statin :  Crestor 20mg  qhs        If AMI and none ordered, has MD documented why?      ___________________________________________________________________  ___________________________________________________________________      Vaccination Information:      There is no immunization history on file for this patient.         a) PNA: unknown              If patient declines, is it documented? ___________________________________________________________________  ___________________________________________________________________                   Discharge Planning:      a) Pt Admitted From:     home      b) Discharge to:                             Home - yes                             Rehab  [facility]                             Home with Home Health  ______________________________________________________________________  ______________________________________________________________________      History of Smoking within the past year?:     No                      Smoking Cessation Education Provided and documented:              ______________________________________________________________________          Patient / Family Goals For Today:  ambulate, remain chest pain free

## 2009-10-17 NOTE — Progress Notes (Signed)
Bedside and Verbal shift change report given to RaShaun Yerby RN (oncoming nurse) by Staci Harpster RN (offgoing nurse).  Report given with SBAR, Procedure Summary, MAR and Recent Results.

## 2009-10-17 NOTE — Progress Notes (Signed)
Cardiac rehab Spoke with pt re cardiac cath site care wrist  S&S of infection, no lifting, pulling, pushing 3-5 lbs X 7 days, bleeding, when to call MD. Written cardiac cath site care wrist given to pt. Explained what a stent is . Pt stated I don't care to know what it is as long as it works. Written stent info given to pt. Stated has had heart attacks before. Stated his blood sugars have been running high as of late. Has not been getting any exercise lately. Spoke with pt re out pt cardiac rehab Stated had attended in the past and will attend [facility] as it is closer to him.      Pt given printed teaching materials on MI.  Reviewed/instructed on signs/symptoms of angina/MI, risk factors, graduated activity according to instructions, daily weight monitoring, heart healthy diet, taking medications as prescribed, and benefits of cardiac rehab program.  Smoking history assessed does not smoke. Stated ready to go home today. Pt seems nervous ? Receptive to teaching.

## 2009-10-17 NOTE — Progress Notes (Signed)
RE: Lovenox + Coumadin teaching    I reviewed all pertinent information with the patient.  All relevant questions were answered.  Literature was given to the patient with instructions to call pharmacy with any additional questions.       Thank you for consulting pharmacy,    Gildardo Cranker, PharmD

## 2009-10-17 NOTE — Progress Notes (Signed)
Critical Result Notification    Received and verbally repeated the following test results Glucose 39 from the labratory on 10/17/09, at 0542.    Patient has been treated for hypoglycemia and most recent BG is 120.      Additional comments:    Jeani Fassnacht Katrina Stack, RN

## 2009-10-17 NOTE — Progress Notes (Signed)
Ambulated in the hallway without difficulty.

## 2009-10-17 NOTE — Progress Notes (Signed)
Patient c/o blurry vision and requests blood sugar to be checked. At 0412 BG: 52, administered 8oz orange juice. Rechecked at 0434 BG: 58, given coffee with cream and sugar and graham crackers per patient request. Rechecked at 0505 BG: 120. Patient without complaints of hypoglycemia.

## 2009-10-17 NOTE — Progress Notes (Signed)
Christia Reading NP notified of pts elevated blood glucose, advised to give the 12units per the SSI, will continue to monitor.

## 2009-10-17 NOTE — Progress Notes (Signed)
Pt given discharge instructions, iv removed with the catheter tip intact, discharged to home/self care with daughter.

## 2009-10-17 NOTE — Discharge Summary (Signed)
[address], [place], [place] [address]  [phone]         Cardiology Discharge Summary     Patient ID:  Travis [name]  [id number]  69 y.o.  Dec 26, 1940    Admit Date: 10/15/2009    Discharge Date: 10/24/2009     Admitting Physician: Victory Dakin, MD     Discharge Physician: Colin Benton, NP    Admission Diagnoses:   Acute MI  Acute MI    Discharge Diagnoses:   Patient Active Hospital Problem List:  *NSTEMI (non-ST elevated myocardial infarction) (10/15/2009)    CHF (congestive heart failure) ()  Preserved LV function    CAD (coronary artery disease) ()  Cardiac History:   Mr. Travis [name] has known coronary artery disease dating back to May 1999 when   he underwent cardiac catheterization prior to planned peripheral vascular   surgery. This showed distal disease in the apical LAD and right coronary   artery with a normal ejection fraction.Non q MI in 7/06.cath showed mod   LAD abn RCA disease up to 50-60% and a small occluded LCX with normal   LVEF.  Had elevated troponin (?NSTEMI) during admission with pneumonia and   ARF 05/2009 - proceeded to have a PCI   (DES) to RCA.  He returned for a   NSTEMI (trop ~30) on 06/21/09 but cath showed stable findings.    Prior Cardiac Eval:   1) stress test 02/09/07. The type of test was a dobutamine stress perfusion   imaging. Cardiac stress testing was negative for chest pain and myocardial   ischemia. Myocardial perfusion imaging showed reversible defect(s)   involving the inferior wall. Gated EF (%): 55.   2) echocardiogram 01/19/07. Echocardiogram demonstrated normal LV wall   motion and ejection fraction, LVH and left atrial enlargement.   3) Carotid Dopplers in July 2009 showed bilateral 50-79% stenosis.   4) Carotids 07/08/08 - 50-79% stenosis bilat  5) Echo 04/03/09 - TDS, moderate LVH, LVEF 65%; moderate LAE, RAE, mild AS   (mean gradient 10), mild MR  6) Echo 1/11 - mild LVH, LAE, EF 55%, mild Pulm HTN, mild MR  7) Cath 06/05/09- DES to RCA   8) cath 06/22/09 - LAD 50%, LCX 100%, mid RCA patent stent, distal RCA 40%  9) Echo 06/22/09 - mild LVH, EF 55-60%, mild LAE, mild MR/TR, PASP 60      Chronic kidney disease, stage III (moderate) (09/29/2009)    Acute myocardial infarction, unspecified site, initial episode of care (09/30/2009)    DM renal manif type II (10/05/2009)    PVD (peripheral vascular disease) (10/06/2009)    Hyperlipidemia (10/06/2009)    HTN (hypertension) (10/13/2009)    S/P coronary artery stent placement (10/17/2009)  10/16/09 85% ulcerated plaque mid RCA successfully stented with 2.75   Xience.   CTO Cx successful PTCA and DES of proximal part with 2.25 Atom.           Discharge Condition: Good    Cardiology Procedures this Admission:  Left heart catheterization with percutaneous coronary intervention    Hospital Course:    Travis [name] has known coronary artery disease dating back to May 1999 when   he underwent cardiac catheterization prior to planned peripheral vascular   surgery. This showed distal disease in the apical LAD and right coronary   artery with a normal ejection fraction.Non q MI in 7/06.cath showed mod   LAD abn RCA disease up to 50-60% and a small occluded LCX  with normal   LVEF.  Had elevated troponin (?NSTEMI) during admission with pneumonia and   ARF 05/2009 - proceeded to have a PCI   (DES) to RCA.  He returned for a   NSTEMI (trop ~30) on 06/21/09 but cath showed stable findings.    Prior Cardiac Eval:   1) stress test 02/09/07. The type of test was a dobutamine stress perfusion   imaging. Cardiac stress testing was negative for chest pain and myocardial   ischemia. Myocardial perfusion imaging showed reversible defect(s)   involving the inferior wall. Gated EF (%): 55.   2) echocardiogram 01/19/07. Echocardiogram demonstrated normal LV wall   motion and ejection fraction, LVH and left atrial enlargement.   3) Carotid Dopplers in July 2009 showed bilateral 50-79% stenosis.   4) Carotids 07/08/08 - 50-79% stenosis bilat   5) Echo 04/03/09 - TDS, moderate LVH, LVEF 65%; moderate LAE, RAE, mild AS   (mean gradient 10), mild MR  6) Echo 1/11 - mild LVH, LAE, EF 55%, mild Pulm HTN, mild MR  7) Cath 06/05/09- DES to RCA  8) cath 06/22/09 - LAD 50%, LCX 100%, mid RCA patent stent, distal RCA 40%  9) Echo 06/22/09 - mild LVH, EF 55-60%, mild LAE, mild MR/TR, PASP 60        Consults: none    Visit Vitals   Item Reading   ??? BP 150/58   ??? Pulse 96   ??? Temp 98.6 ??F (37 ??C)   ??? Resp 18   ??? Ht 5\' 6"  (1.676 m)   ??? Wt 181 lb (82.1 kg)   ??? BMI 29.21 kg/m2   ??? SpO2 100%         Physical Exam  Abdomen: soft, non-tender. Bowel sounds normal. No masses,  no organomegaly  Extremities: extremities normal, atraumatic, no cyanosis or edema  Heart: regular rate and rhythm, S1, S2 normal, no murmur, click, rub or gallop  Lungs: clear to auscultation bilaterally  Neck: supple, symmetrical, trachea midline, no adenopathy, thyroid: not enlarged, symmetric, no tenderness/mass/nodules, no carotid bruit and no JVD  Neurologic: Grossly normal  Pulses: 2+ and symmetrical    Labs:   No results found for this basename: WBC:3,HGB:3,HCT:3,PLT:3 in the last 72 hours      No results found for this basename: NA:3,K:3,CL:3,CO2:3,GLU:3,BUN:3,CREA:3,CA:3,MG:3,PHOS:3,ALB:3,TBIL:3,SGOT:3,ALT:3,INR:3 in the last 72 hours        No results found for this basename: TROIQ:6,CPK:3,CKMB:3 in the last 72 hours  EKG:  Cxray:  Device check:    Disposition: home    Patient Instructions:   Patient's Medications   Start Taking    ENOXAPARIN (LOVENOX) 80 MG/0.8 ML INJECTION    80 mg by SubCUTAneous route every twelve (12) hours. Start injections this PM, 10/17/09 thru Thursday morning, total of 5 injections    WARFARIN (COUMADIN) 7.5 MG TABLET    Take 1 Tab by mouth daily.   Continue Taking    AMLODIPINE (NORVASC) 5 MG TABLET    Take 1 Tab by mouth daily.    ASPIRIN 81 MG TABLET    Take  by mouth daily.    BUMETANIDE (BUMEX) 2 MG TABLET    Take 2 mg by mouth two (2) times a day.     CLOPIDOGREL (PLAVIX) 75 MG TABLET    Take  by mouth daily.    INSULIN ASPART (NOVOLOG) 100 UNIT/ML INJECTION    by SubCUTAneous route.    INSULIN GLARGINE (LANTUS) 100 UNIT/ML INJECTION    30 Units by  SubCUTAneous route once.    ISOSORBIDE MONONITRATE ER (IMDUR) 60 MG CR TABLET    Take 1 Tab by mouth every morning.    LEVOTHYROXINE (SYNTHROID) 150 MCG TABLET    Take  by mouth daily (before breakfast).    LISINOPRIL (PRINIVIL, ZESTRIL) 20 MG TABLET    Take  by mouth daily.    METOPROLOL TARTRATE PO    Take 25 mg by mouth two (2) times a day.    MISOPROSTOL (CYTOTEC) 200 MCG TABLET    Take 1 Tab by mouth two (2) times a day.    NITROGLYCERIN (NITROSTAT) 0.4 MG SL TABLET    by SubLINGual route every five (5) minutes as needed.    OMEGA-3 FATTY ACIDS-VITAMIN E (FISH OIL) 1,000 MG CAP    Take  by mouth two (2) times a day.    POTASSIUM CHLORIDE SA (MICRO-K) 10 MEQ CAPSULE    Take 10 mEq by mouth daily.    RANITIDINE HCL (ZANTAC) 150 MG CAPSULE    Take 150 mg by mouth two (2) times a day.    ROSUVASTATIN (CRESTOR) 20 MG TABLET    Take 20 mg by mouth daily.    TERAZOSIN (HYTRIN) 5 MG CAPSULE    Take 10 mg by mouth nightly.   These Medications have changed    No medications on file   Stop Taking    No medications on file         Referenced discharge instructions provided by nursing for diet and activity.    Follow-up with    Signed:  Colin Benton, NP  10/24/2009  10:43 AM

## 2009-10-17 NOTE — Progress Notes (Signed)
Doing fine.  VSS.  R radial 2 + with no vasc comp.  Exam OK.  Cr up slightly, but UO OK.  INR 1.2.    Doing fine post 2 vessel PCI.  Needs transition back to coumadin because of recent thrombosis of leg graft.  Will look into home lovenox > coumadin with f/u INR in Dr. Milta Deiters office on Thursday.

## 2009-10-20 ENCOUNTER — Telehealth

## 2009-10-20 MED ORDER — PRASUGREL 10 MG TAB
10 mg | ORAL_TABLET | Freq: Every day | ORAL | Status: DC
Start: 2009-10-20 — End: 2009-10-21

## 2009-10-20 NOTE — Telephone Encounter (Signed)
Message copied by Einar Gip on Fri Oct 20, 2009  1:39 PM  ------       Message from: Laddie Aquas       Created: Fri Oct 20, 2009  1:25 PM       Regarding: med changes         I spoke to Dr. Orvil Feil and this is the plan.              Stop lovenox       Stop coumadin       Stop plavix              start Effient 60 mg today and then 10 mg daily starting tomorrow       Aspirin should be 81 mg daily              See Dr. Orvil Feil as planned and me in a couple of weeks.

## 2009-10-20 NOTE — Telephone Encounter (Signed)
L/m for dtr to call and faxed script to pharmacy in the system.

## 2009-10-20 NOTE — Telephone Encounter (Signed)
Pt's dtr, [name], called to let me know she got the medications straight about her dad and she said that Dr Welton Flakes wanted to know if Travis [name] had ever had a stroke and she said that to her knowledge he has not.

## 2009-10-20 NOTE — Telephone Encounter (Signed)
Plavix resistance testing shows only 15% platelet inhibition - he is plavix resistant.  I spoke to the Patient, daughter, Dr. Orvil Feil and Dr. Carmell Austria.  I think the best approach is to have him on Effient and ASA (81 daily).  Adding coumadin to the regimen would significantly increase the risk of major bleeding.  Will stop lovenox, coumadin, plavix.

## 2009-10-21 ENCOUNTER — Encounter

## 2009-10-21 MED ORDER — PRASUGREL 10 MG TAB
10 mg | ORAL_TABLET | Freq: Every day | ORAL | Status: DC
Start: 2009-10-21 — End: 2010-02-07

## 2009-10-21 NOTE — Telephone Encounter (Addendum)
I had to reorder the effient for Travis [name] . For some reason it did not go through.

## 2009-10-26 MED ORDER — BUMETANIDE 2 MG TAB
2 mg | ORAL_TABLET | Freq: Every day | ORAL | Status: DC
Start: 2009-10-26 — End: 2009-11-06

## 2009-10-26 MED ORDER — POTASSIUM CHLORIDE SR 10 MEQ CAP
10 mEq | ORAL_CAPSULE | Freq: Every day | ORAL | Status: DC
Start: 2009-10-26 — End: 2009-11-06

## 2009-10-26 NOTE — Progress Notes (Signed)
Tharon Aquas, MD. [facility]  [address], [address]  [place], [place] [address]  Ph [phone];   Fax [phone]      Patient: Travis [name]  DOB: [birth year]      History of Present Illness:  Today has been weak and sluggish.  Almost fell a couple of times.  Lightheaded.  No chest pain.  No obvious bleeding past few days, but he notes he had some dripping of blood after a BM last week.  He just had a BM in the office and feels better.    Past Medical History   Diagnosis Date   ??? CAD (coronary artery disease)      see problem list   ??? HTN (hypertension)    ??? PVD (peripheral vascular disease)    ??? DM (diabetes mellitus)    ??? Hyperlipidemia    ??? Sleep apnea      on CPAP   ??? COPD (chronic obstructive pulmonary disease)      mild COPD, recurrent pneumonia, chronic bronchitis (followed by Dr. Leroy Libman)   ??? CKD (chronic kidney disease)      Cr 1.7 in 10/09   ??? CHF (congestive heart failure)    ??? Anemia    ??? Pulmonary hypertension      PASP 60 on echo 06/22/09     Cardiac History:   Mr. Merriott has known coronary artery disease dating back to May 1999 when he underwent cardiac catheterization prior to planned peripheral vascular surgery. This showed distal disease in the apical LAD and right coronary artery with a normal ejection fraction.Non q MI in 7/06.cath showed mod LAD abn RCA disease up to 50-60% and a small occluded LCX with normal LVEF.  Had elevated troponin (?NSTEMI) during admission with pneumonia and ARF 05/2009 - proceeded to have a PCI   (DES) to RCA.  He returned for a NSTEMI (trop ~30) on 06/21/09 but cath showed stable findings.  NSTEMI with another cath in 2/11 - the stent was patent and no new findings.  Multiple NSTEMI's in 5/11 - proceeded on 10/16/09 to PCI of 85% ulcerated plaque mid RCA successfully stented with 2.75 Xience.  CTO Cx successful PTCA and DES of proximal part with 2.25 Atom.     Prior Cardiac Eval:   X) Cath 2003 and 2006 - chronic LCX occlusion   1) stress test 02/09/07. The type of test was a dobutamine stress perfusion imaging. Cardiac stress testing was negative for chest pain and myocardial ischemia. Myocardial perfusion imaging showed reversible defect(s) involving the inferior wall. Gated EF (%): 55.   2) echocardiogram 01/19/07. Echocardiogram demonstrated normal LV wall motion and ejection fraction, LVH and left atrial enlargement.   3) Carotid Dopplers in July 2009 showed bilateral 50-79% stenosis.   4) Carotids 07/08/08 - 50-79% stenosis bilat  5) Echo 04/03/09 - TDS, moderate LVH, LVEF 65%; moderate LAE, RAE, mild AS (mean gradient 10), mild MR  6) Echo 1/11 - mild LVH, LAE, EF 55%, mild Pulm HTN, mild MR  7) Cath 06/05/09- DES to RCA  8) cath 06/22/09 - LAD 50%, LCX 100%, mid RCA patent stent, distal RCA 40%  9) Echo 06/22/09 - mild LVH, EF 55-60%, mild LAE, mild MR/TR, PASP 60  10) Cath 07/18/09 - patent stent and stable findings; EF 60%  11) Cath 10/16/09 - 85% ulcerated plaque mid RCA successfully stented with 2.75 Xience.  CTO Cx successful PTCA and DES of proximal part with 2.25 Atom.  Current outpatient prescriptions   Medication Sig Dispense Refill   ??? prasugrel (EFFIENT) 10 mg tablet Take 1 Tab by mouth daily. FIRST DOSE TODAY SHOULD BE 60 MG  45 Tab  4   ??? insulin glargine (LANTUS) 100 unit/mL injection 30 Units by SubCUTAneous route once.       ??? potassium chloride SA (MICRO-K) 10 mEq capsule Take 10 mEq by mouth daily.       ??? misoprostol (CYTOTEC) 200 mcg tablet Take 1 Tab by mouth two (2) times a day.  60 Tab  3   ??? bumetanide (BUMEX) 2 mg tablet Take 2 mg by mouth two (2) times a day.       ??? lisinopril (PRINIVIL, ZESTRIL) 20 mg tablet Take  by mouth daily.       ??? rosuvastatin (CRESTOR) 20 mg tablet Take 20 mg by mouth daily.       ??? terazosin (HYTRIN) 5 mg capsule Take 10 mg by mouth nightly.       ??? isosorbide mononitrate ER (IMDUR) 60 mg CR tablet Take 1 Tab by mouth every morning.  30 Tab  6    ??? aspirin 81 mg tablet Take  by mouth daily.       ??? omega-3 fatty acids-vitamin e (FISH OIL) 1,000 mg Cap Take  by mouth two (2) times a day.       ??? levothyroxine (SYNTHROID) 150 mcg tablet Take  by mouth daily (before breakfast).       ??? METOPROLOL TARTRATE PO Take 25 mg by mouth two (2) times a day.       ??? nitroglycerin (NITROSTAT) 0.4 mg SL tablet by SubLINGual route every five (5) minutes as needed.       ??? insulin aspart (NOVOLOG) 100 unit/mL injection by SubCUTAneous route.       ??? ranitidine hcl (ZANTAC) 150 mg capsule Take 150 mg by mouth two (2) times a day.           Review of Systems:   Constitutional: Negative for fever, chills, weight loss, malaise/fatigue and diaphoresis.   HEENT: Negative for nosebleeds, congestion, neck pain, tinnitus, and vision changes.   Respiratory: Negative for hemoptysis, sputum production, and wheezing.   Gastrointestinal: Negative for nausea, vomiting, abdominal pain, diarrhea, constipation, blood in stool and melena.   Genitourinary: Negative for dysuria, urgency, frequency and hematuria.   Musculoskeletal: Negative for myalgias.   Skin: Negative for rash and itching.   Heme: Does not bleed or bruise easily.   Neurological: Negative for speech change and focal weakness.     Physical Exam:   Vital Signs  Pulse (Heart Rate): 60  (10/26/09 1456)  BP: 108/60 mmHg (standing) (10/26/09 1456)  BP supine 124/60  Patient appears generally well, mood and affect are appropriate and pleasant.   HEENT: Normocephalic, atraumatic.   Neck Exam: Supple, Left carotid bruit.   Lung Exam: Clear to auscultation, even breath sounds.   Cardiac Exam: Regular rate and rhythm with 2/6 systolic murmur.   Abdomen: Soft, non-tender, normal bowel sounds. No bruits or masses.   Extremities: mild lower extremity edema   Vascular: difficult to palpate dorsalis pedis pulses bilaterally.       Assessment and Plan:   1) Lightheadedness - has borderline orthostatic BP.  Will cut back Bumex to 2 mg daily (and follow weight edema closely) and stop Imdur.  Will check CBC and BMP today.   2) CAD - need to continue Effient due to plavix  resistance and recently placed drug eluting stent  3) RTC 2 weeks.    Tharon Aquas, MD, [facility]

## 2009-10-27 LAB — METABOLIC PANEL, BASIC
BUN/Creatinine ratio: 25 — ABNORMAL HIGH (ref 10–22)
BUN: 66 mg/dL — ABNORMAL HIGH (ref 8–27)
CO2: 27 mmol/L (ref 20–32)
Calcium: 8.7 mg/dL (ref 8.6–10.2)
Chloride: 93 mmol/L — ABNORMAL LOW (ref 97–108)
Creatinine: 2.68 mg/dL — ABNORMAL HIGH (ref 0.76–1.27)
GFR est AA: 27 mL/min/{1.73_m2} — ABNORMAL LOW (ref >59)
GFR est non-AA: 23 mL/min/{1.73_m2} — ABNORMAL LOW (ref >59)
Glucose: 318 mg/dL — ABNORMAL HIGH (ref 65–99)
Potassium: 5.6 mmol/L — ABNORMAL HIGH (ref 3.5–5.2)
Sodium: 132 mmol/L — ABNORMAL LOW (ref 135–145)

## 2009-10-27 LAB — CBC W/O DIFF
HCT: 31.9 % — ABNORMAL LOW (ref 36.0–50.0)
HGB: 10.2 g/dL — ABNORMAL LOW (ref 12.5–17.0)
MCH: 29.1 pg (ref 27.0–34.0)
MCHC: 32 g/dL (ref 32.0–36.0)
MCV: 91 fL (ref 80–98)
PLATELET: 214 10*3/uL (ref 140–415)
RBC: 3.51 x10E6/uL — ABNORMAL LOW (ref 4.10–5.60)
RDW: 17 % — ABNORMAL HIGH (ref 11.7–15.0)
WBC: 6.3 10*3/uL (ref 4.0–10.5)

## 2009-10-27 NOTE — Progress Notes (Addendum)
Quick Note:    Please send him copy of results. He has been notified to stop his potassium supplement.  Tharon Aquas, MD, [facility]    ______

## 2009-10-30 NOTE — Progress Notes (Addendum)
Mailed

## 2009-11-01 NOTE — Telephone Encounter (Signed)
Potassium stopped by Dr Welton Flakes for now. Have faxed over order to pharm they were looking for a refill.

## 2009-11-06 MED ORDER — BUMETANIDE 2 MG TAB
2 mg | ORAL_TABLET | Freq: Every day | ORAL | Status: DC
Start: 2009-11-06 — End: 2010-01-03

## 2009-11-06 NOTE — Progress Notes (Signed)
Tharon Aquas, MD. [facility]  [address], [address]  [place], [place] [address]  Ph [phone];   Fax [phone]      Patient: Travis [name]  DOB: May 16, 1941      History of Present Illness:  Here for 2 week FU.  Has had chest pain over the weekend - felt like gas - he took 5 SL NTG altogether over the weekend - symptoms got better after belching.  Weak and lightheadedness past couple of weeks- not much better after medicine changes last visit.  No problems LE swelling.  Breathing stable.  Labs last week showed ARF and hyperkalemia.  Bumex was decreased and Imdur and KCl were stopped.  He refuses readmission to the hospital.      Past Medical History   Diagnosis Date   ??? CAD (coronary artery disease)      see problem list   ??? HTN (hypertension)    ??? PVD (peripheral vascular disease)    ??? DM (diabetes mellitus)    ??? Hyperlipidemia    ??? Sleep apnea      on CPAP   ??? COPD (chronic obstructive pulmonary disease)      mild COPD, recurrent pneumonia, chronic bronchitis (followed by Dr. Leroy Libman)   ??? CKD (chronic kidney disease)      Cr 1.7 in 10/09   ??? CHF (congestive heart failure)    ??? Anemia    ??? Pulmonary hypertension      PASP 60 on echo 06/22/09       Cardiac History:    Travis [name] has known coronary artery disease dating back to May 1999 when he underwent cardiac catheterization prior to planned peripheral vascular surgery. This showed distal disease in the apical LAD and right coronary artery with a normal ejection fraction.Non q MI in 7/06.cath showed mod LAD abn RCA disease up to 50-60% and a small occluded LCX with normal LVEF. Had elevated troponin (?NSTEMI) during admission with pneumonia and ARF 05/2009 - proceeded to have a PCI (DES) to RCA. He returned for a NSTEMI (trop ~30) on 06/21/09 but cath showed stable findings. NSTEMI with another cath in 2/11 - the stent was patent and no new findings. Multiple NSTEMI's in 5/11 - proceeded on 10/16/09 to PCI of 85% ulcerated plaque mid RCA successfully stented with 2.75 Xience. CTO Cx successful PTCA and DES of proximal part with 2.25 Atom.     Prior Cardiac Eval:   X) Cath 2003 and 2006 - chronic LCX occlusion   1) stress test 02/09/07. The type of test was a dobutamine stress perfusion imaging. Cardiac stress testing was negative for chest pain and myocardial ischemia. Myocardial perfusion imaging showed reversible defect(s) involving the inferior wall. Gated EF (%): 55.   2) echocardiogram 01/19/07. Echocardiogram demonstrated normal LV wall motion and ejection fraction, LVH and left atrial enlargement.   3) Carotid Dopplers in July 2009 showed bilateral 50-79% stenosis.   4) Carotids 07/08/08 - 50-79% stenosis bilat   5) Echo 04/03/09 - TDS, moderate LVH, LVEF 65%; moderate LAE, RAE, mild AS (mean gradient 10), mild MR   6) Echo 1/11 - mild LVH, LAE, EF 55%, mild Pulm HTN, mild MR   7) Cath 06/05/09- DES to RCA   8) cath 06/22/09 - LAD 50%, LCX 100%, mid RCA patent stent, distal RCA 40%   9) Echo 06/22/09 - mild LVH, EF 55-60%, mild LAE, mild MR/TR, PASP 60   10) Cath 07/18/09 - patent stent and stable  findings; EF 60%    11) Cath 10/16/09 - 85% ulcerated plaque mid RCA successfully stented with 2.75 Xience. CTO Cx successful PTCA and DES of proximal part with 2.25 Atom.     Current outpatient prescriptions   Medication Sig Dispense Refill   ??? bumetanide (BUMEX) 2 mg tablet Take 1 Tab by mouth daily.  30 Tab  0   ??? prasugrel (EFFIENT) 10 mg tablet Take 1 Tab by mouth daily. FIRST DOSE TODAY SHOULD BE 60 MG  45 Tab  4   ??? insulin glargine (LANTUS) 100 unit/mL injection 30 Units by SubCUTAneous route once.       ??? misoprostol (CYTOTEC) 200 mcg tablet Take 1 Tab by mouth two (2) times a day.  60 Tab  3   ??? lisinopril (PRINIVIL, ZESTRIL) 20 mg tablet Take  by mouth daily.       ??? rosuvastatin (CRESTOR) 20 mg tablet Take 20 mg by mouth daily.       ??? terazosin (HYTRIN) 5 mg capsule Take 10 mg by mouth nightly.       ??? aspirin 81 mg tablet Take  by mouth daily.       ??? omega-3 fatty acids-vitamin e (FISH OIL) 1,000 mg Cap Take  by mouth two (2) times a day.       ??? levothyroxine (SYNTHROID) 150 mcg tablet Take  by mouth daily (before breakfast).       ??? METOPROLOL TARTRATE PO Take 25 mg by mouth two (2) times a day.       ??? nitroglycerin (NITROSTAT) 0.4 mg SL tablet by SubLINGual route every five (5) minutes as needed.       ??? insulin aspart (NOVOLOG) 100 unit/mL injection by SubCUTAneous route.       ??? ranitidine hcl (ZANTAC) 150 mg capsule Take 150 mg by mouth two (2) times a day.       ??? potassium chloride SA (MICRO-K) 10 mEq capsule Take 1 Cap by mouth daily.  30 Cap  11     No Known Allergies    Review of Systems:   Constitutional: Negative for fever, chills, weight loss, malaise/fatigue and diaphoresis.   HEENT: Negative for nosebleeds, congestion, neck pain, tinnitus, and vision changes.   Respiratory: Negative for hemoptysis, sputum production, and wheezing.   Gastrointestinal: Negative for nausea, vomiting, abdominal pain, diarrhea, constipation, blood in stool and melena.    Genitourinary: Negative for dysuria, urgency, frequency and hematuria.   Musculoskeletal: Negative for myalgias.   Skin: Negative for rash and itching.   Heme: Does not bleed or bruise easily.   Neurological: Negative for speech change and focal weakness.     Physical Exam:   BP 118/60   Pulse 60   Ht 5\' 6"  (1.676 m)   Wt 175 lb (79.379 kg)   BMI 28.25 kg/m2   BP: 140/80 mmHg (standing) (10/26/09 1456) BP supine 118/60   Patient appears generally well, mood and affect are appropriate and pleasant.   HEENT: Normocephalic, atraumatic.   Neck Exam: Supple, Left carotid bruit.   Lung Exam: Clear to auscultation, even breath sounds.   Cardiac Exam: Regular rate and rhythm with 2/6 systolic murmur.   Abdomen: Soft, non-tender, normal bowel sounds. No bruits or masses.   Extremities: mild lower extremity edema   Vascular: difficult to palpate dorsalis pedis pulses bilaterally.     EKG today - NSR, ventricular pacing    Assessment and Plan:   1) Lightheadedness - has borderline  orthostatic BP. Labwork two weeks ago showed ARF.  His Bumex was cut back and imdur stopped then, but he still is having problems with orthostatic type symptoms.  I recommended that we admit Travis [name] for a short admission for some hydration given lack of improvement with outpatient management, but he declines.  Have given Travis [name] some IVF in the office (500 cc) and will have him stop the lisinopril and cut back bumex to 1 mg daily for the next couple of days. Have to watch for signs of volume overload.  Will reassess later in the week.    2) CAD - had some chest pain this weekend, but none now.  Continue medical management.  If symptoms worsen, he is advised to call 911.       3) RTC later this week.  Check a BMP.    Tharon Aquas, MD, [facility]

## 2009-11-06 NOTE — Progress Notes (Signed)
Per Dr Milta Deiters instructions, pt to stay for IV fluid hydration. IV started at 1455, in the left ACF (X2sticks) with a #22g jelco. 500cc bag of   NS started to run over 1 hour. Pt tolerating procedure well. 1615 IV discontinued. Gauze bandage applied and pressure held x 3 mins  Until hemostasis achieved. Pt encouraged to leave bandage in place x 1 hour. He rec'd 400 cc NS. Left the office accompanied by dtr  W/o complaints voiced.

## 2009-11-07 LAB — CBC W/O DIFF
HCT: 22.7 % — ABNORMAL LOW (ref 36.0–50.0)
HGB: 7.4 g/dL — ABNORMAL LOW (ref 12.5–17.0)
MCH: 29.7 pg (ref 27.0–34.0)
MCHC: 32.6 g/dL (ref 32.0–36.0)
MCV: 91 fL (ref 80–98)
PLATELET: 196 10*3/uL (ref 140–415)
RBC: 2.49 x10E6/uL — CL (ref 4.10–5.60)
RDW: 17.1 % — ABNORMAL HIGH (ref 11.7–15.0)
WBC: 6.9 10*3/uL (ref 4.0–10.5)

## 2009-11-07 LAB — METABOLIC PANEL, BASIC
BUN/Creatinine ratio: 19 (ref 10–22)
BUN: 49 mg/dL — ABNORMAL HIGH (ref 8–27)
CO2: 30 mmol/L (ref 20–32)
Calcium: 8.6 mg/dL (ref 8.6–10.2)
Chloride: 96 mmol/L — ABNORMAL LOW (ref 97–108)
Creatinine: 2.57 mg/dL — ABNORMAL HIGH (ref 0.76–1.27)
GFR est AA: 28 mL/min/{1.73_m2} — ABNORMAL LOW (ref >59)
GFR est non-AA: 24 mL/min/{1.73_m2} — ABNORMAL LOW (ref >59)
Glucose: 132 mg/dL — ABNORMAL HIGH (ref 65–99)
Potassium: 4.6 mmol/L (ref 3.5–5.2)
Sodium: 137 mmol/L (ref 135–145)

## 2009-11-07 LAB — PLEASE NOTE

## 2009-11-07 NOTE — Progress Notes (Addendum)
I spoke at length with daughter.  Hgb 7.4.  Travis [name] needs to be admitted for transfusion and further workup.  I called Travis [name] and informed as well.  He is hesitant to be readmitted but will speak with his daughter.  Will need repeat GI eval.

## 2009-11-08 ENCOUNTER — Inpatient Hospital Stay
Admit: 2009-11-08 | Discharge: 2009-11-13 | Disposition: A | Discharge status: Home / Self Care | Payer: MEDICARE | Attending: Specialist | Admitting: Specialist

## 2009-11-08 DIAGNOSIS — N179 Acute kidney failure, unspecified: Secondary | ICD-10-CM

## 2009-11-08 LAB — METABOLIC PANEL, COMPREHENSIVE
A-G Ratio: 0.9 — ABNORMAL LOW (ref 1.1–2.2)
ALT (SGPT): 30 U/L (ref 12–78)
AST (SGOT): 25 U/L (ref 15–37)
Albumin: 3.3 g/dL — ABNORMAL LOW (ref 3.5–5.0)
Alk. phosphatase: 78 U/L (ref 50–136)
Anion gap: 7 mmol/L (ref 5–15)
BUN/Creatinine ratio: 18 (ref 12–20)
BUN: 48 MG/DL — ABNORMAL HIGH (ref 6–20)
Bilirubin, total: 0.2 MG/DL (ref 0.2–1.0)
CO2: 28 MMOL/L (ref 21–32)
Calcium: 8.3 MG/DL — ABNORMAL LOW (ref 8.5–10.1)
Chloride: 100 MMOL/L (ref 97–108)
Creatinine: 2.6 MG/DL — ABNORMAL HIGH (ref 0.6–1.3)
GFR est AA: 32 mL/min/{1.73_m2} — ABNORMAL LOW (ref >60)
GFR est non-AA: 26 mL/min/{1.73_m2} — ABNORMAL LOW (ref >60)
Globulin: 3.6 g/dL (ref 2.0–4.0)
Glucose: 272 MG/DL — ABNORMAL HIGH (ref 65–100)
Potassium: 4.5 MMOL/L (ref 3.5–5.1)
Protein, total: 6.9 g/dL (ref 6.4–8.2)
Sodium: 135 MMOL/L — ABNORMAL LOW (ref 136–145)

## 2009-11-08 LAB — GLUCOSE, POC: Glucose (POC): 208 mg/dL — ABNORMAL HIGH (ref 75–110)

## 2009-11-08 LAB — TROPONIN I: Troponin-I, Qt.: 0.04 ng/mL (ref <0.05)

## 2009-11-08 MED ORDER — GLUCAGON 1 MG INJECTION
1 mg | INTRAMUSCULAR | Status: DC | PRN
Start: 2009-11-08 — End: 2009-11-13

## 2009-11-08 MED ORDER — INSULIN ASPART 100 UNIT/ML INJECTION
100 unit/mL | Freq: Four times a day (QID) | SUBCUTANEOUS | Status: DC
Start: 2009-11-08 — End: 2009-11-10
  Administered 2009-11-09 – 2009-11-10 (×5): via SUBCUTANEOUS

## 2009-11-08 MED ORDER — ACETAMINOPHEN 325 MG TABLET
325 mg | Freq: Four times a day (QID) | ORAL | Status: DC | PRN
Start: 2009-11-08 — End: 2009-11-13

## 2009-11-08 MED ORDER — PANTOPRAZOLE 40 MG TAB, DELAYED RELEASE
40 mg | Freq: Every day | ORAL | Status: DC
Start: 2009-11-08 — End: 2009-11-13
  Administered 2009-11-09 – 2009-11-13 (×5): via ORAL

## 2009-11-08 MED ORDER — PRASUGREL 10 MG TAB
10 mg | Freq: Every day | ORAL | Status: DC
Start: 2009-11-08 — End: 2009-11-13
  Administered 2009-11-09 – 2009-11-13 (×5): via ORAL

## 2009-11-08 MED ORDER — INSULIN ASPART 100 UNIT/ML INJECTION
100 unit/mL | Freq: Four times a day (QID) | SUBCUTANEOUS | Status: DC
Start: 2009-11-08 — End: 2009-11-08
  Administered 2009-11-08: 21:00:00 via SUBCUTANEOUS

## 2009-11-08 MED ORDER — MISOPROSTOL 200 MCG TAB
200 mcg | Freq: Two times a day (BID) | ORAL | Status: DC
Start: 2009-11-08 — End: 2009-11-13
  Administered 2009-11-08 – 2009-11-13 (×10): via ORAL

## 2009-11-08 MED ORDER — ROSUVASTATIN 10 MG TAB
10 mg | Freq: Every day | ORAL | Status: DC
Start: 2009-11-08 — End: 2009-11-13
  Administered 2009-11-10 – 2009-11-13 (×4): via ORAL

## 2009-11-08 MED ORDER — ENOXAPARIN 80 MG/0.8 ML SUB-Q SYRINGE
80 mg/0.8 mL | SUBCUTANEOUS | Status: DC
Start: 2009-11-08 — End: 2009-11-09
  Administered 2009-11-08: 22:00:00 via SUBCUTANEOUS

## 2009-11-08 MED ORDER — ASPIRIN 81 MG CHEWABLE TAB
81 mg | Freq: Every day | ORAL | Status: DC
Start: 2009-11-08 — End: 2009-11-13
  Administered 2009-11-09 – 2009-11-13 (×5): via ORAL

## 2009-11-08 MED ORDER — NITROGLYCERIN 0.4 MG SUBLINGUAL TAB
0.4 mg | SUBLINGUAL | Status: DC | PRN
Start: 2009-11-08 — End: 2009-11-13

## 2009-11-08 MED ORDER — DEXTROSE 50% IN WATER (D50W) IV SYRG
INTRAVENOUS | Status: DC | PRN
Start: 2009-11-08 — End: 2009-11-13
  Administered 2009-11-09: 12:00:00 via INTRAVENOUS

## 2009-11-08 MED ORDER — BUMETANIDE 1 MG TAB
1 mg | Freq: Every day | ORAL | Status: DC
Start: 2009-11-08 — End: 2009-11-13
  Administered 2009-11-09 – 2009-11-13 (×5): via ORAL

## 2009-11-08 MED ORDER — GLUCOSE 4 GRAM CHEWABLE TAB
4 gram | ORAL | Status: DC | PRN
Start: 2009-11-08 — End: 2009-11-13
  Administered 2009-11-10: 09:00:00 via ORAL

## 2009-11-08 MED ORDER — LEVOTHYROXINE 150 MCG TAB
150 mcg | Freq: Every day | ORAL | Status: DC
Start: 2009-11-08 — End: 2009-11-13
  Administered 2009-11-09 – 2009-11-13 (×5): via ORAL

## 2009-11-08 MED ORDER — INSULIN GLARGINE 100 UNIT/ML INJECTION
100 unit/mL | Freq: Once | SUBCUTANEOUS | Status: AC
Start: 2009-11-08 — End: 2009-11-09

## 2009-11-08 MED ORDER — TERAZOSIN 5 MG CAP
5 mg | Freq: Every evening | ORAL | Status: DC
Start: 2009-11-08 — End: 2009-11-13
  Administered 2009-11-10 – 2009-11-13 (×4): via ORAL

## 2009-11-08 MED FILL — MISOPROSTOL 200 MCG TAB: 200 mcg | ORAL | Qty: 1

## 2009-11-08 MED FILL — INSULIN GLARGINE 100 UNIT/ML INJECTION: 100 unit/mL | SUBCUTANEOUS | Qty: 0.3

## 2009-11-08 MED FILL — INSULIN ASPART 100 UNIT/ML INJECTION: 100 unit/mL | SUBCUTANEOUS | Qty: 1

## 2009-11-08 MED FILL — LOVENOX 80 MG/0.8 ML SUBCUTANEOUS SYRINGE: 80 mg/0.8 mL | SUBCUTANEOUS | Qty: 0.8

## 2009-11-08 MED FILL — EFFIENT 10 MG TABLET: 10 mg | ORAL | Qty: 1

## 2009-11-08 NOTE — Consults (Signed)
[facility]  Delana Meyer, M.D.  [phone]                   GASTROENTEROLOGY CONSULTATION NOTE        NAME:  Travis [name]   DOB:   Oct 26, 1940   MRN:   [id number]       Referring Physician: Dr. Welton Flakes    Consult Date: 11/08/2009 5:59 PM    Chief Complaint: Anemia     History of Present Illness:  Patient is a 69 y.o. who is seen in consultation at the request of Dr. Welton Flakes  for anemia. He is known to me from previous admission with anemia. He underwent an EGD and colonoscopy. He had duodenitis and superficial ulceration in the duodenum. His colonoscopy showed a diminutive polyp. He was admitted for weakness and near syncope found to have a hemoglobin of 6.7. He denies GI complaints. He reports that over the course of last week, he has been seeing black stools. However he was told that this is because on he is on iron supplements. He did not have any bowel movements today. He denies abdominal pain.       PMH:  Past Medical History   Diagnosis Date   ??? CAD (coronary artery disease)      see problem list   ??? HTN (hypertension)    ??? PVD (peripheral vascular disease)    ??? DM (diabetes mellitus)    ??? Hyperlipidemia    ??? Sleep apnea      on CPAP   ??? COPD (chronic obstructive pulmonary disease)      mild COPD, recurrent pneumonia, chronic bronchitis (followed by Dr. Leroy Libman)   ??? CKD (chronic kidney disease)      Cr 1.7 in 10/09   ??? CHF (congestive heart failure)    ??? Anemia    ??? Pulmonary hypertension      PASP 60 on echo 06/22/09   ??? Thromboembolus      leg    ??? Thyroid disease    ??? Pneumonia    ??? GERD (gastroesophageal reflux disease)    ??? Chronic kidney disease    ??? Chronic pain      back pain   ??? Cataract    ??? Heart failure          PSH:  Past Surgical History   Procedure Date   ??? Hx orthopaedic      knee replacement   ??? Vascular surgery procedure unlist      arterial bypass   ??? Hx pacemaker    ??? Cardiac surg procedure unlist      cardiac stent   ??? Hx cataract removal      removed rt eye          Allergies:  No Known Allergies    Home Medications:  Prior to Admission Medications   Medication Last Dose Informant Patient Reported? Taking?   insulin glargine (LANTUS) 100 unit/mL injection 11/08/2009 at 1200  Yes Yes   25 Units by SubCUTAneous route once.   bumetanide (BUMEX) 2 mg tablet 11/08/2009 at 1200  No Yes   Take 0.5 Tabs by mouth daily.   prasugrel (EFFIENT) 10 mg tablet 11/08/2009 at 1200  No Yes   Take 1 Tab by mouth daily. FIRST DOSE TODAY SHOULD BE 60 MG   insulin glargine (LANTUS) 100 unit/mL injection 11/07/2009 at 1200  Yes Yes   30 Units by SubCUTAneous route once.  misoprostol (CYTOTEC) 200 mcg tablet 11/08/2009 at 1200  No Yes   Take 1 Tab by mouth two (2) times a day.   rosuvastatin (CRESTOR) 20 mg tablet 11/08/2009 at 1200  Yes Yes   Take 20 mg by mouth daily.   terazosin (HYTRIN) 5 mg capsule 11/08/2009 at 1200  Yes Yes   Take 10 mg by mouth nightly.   aspirin 81 mg tablet 11/08/2009 at 1200  Yes Yes   Take  by mouth daily.   omega-3 fatty acids-vitamin e (FISH OIL) 1,000 mg Cap 11/08/2009 at 1200  Yes Yes   Take  by mouth two (2) times a day.   levothyroxine (SYNTHROID) 150 mcg tablet 11/08/2009 at 1200  Yes Yes   Take  by mouth daily (before breakfast).   METOPROLOL TARTRATE PO 11/08/2009 at 1200  Yes Yes   Take 25 mg by mouth two (2) times a day.   nitroglycerin (NITROSTAT) 0.4 mg SL tablet 11/01/2009 at Unknown  Yes Yes   by SubLINGual route every five (5) minutes as needed.   insulin aspart (NOVOLOG) 100 unit/mL injection 11/08/2009 at 1200  Yes Yes   by SubCUTAneous route.   ranitidine hcl (ZANTAC) 150 mg capsule 11/08/2009 at 1200  Yes Yes   Take 150 mg by mouth two (2) times a day.          Hospital Medications:  Current facility-administered medications   Medication Dose Route Frequency   ??? aspirin chewable tablet 81 mg  81 mg Oral DAILY   ??? levothyroxine (SYNTHROID) tablet 150 mcg  150 mcg Oral ACB   ??? nitroglycerin (NITROSTAT) tablet 0.4 mg  0.4 mg SubLINGual Q5MIN PRN    ??? terazosin (HYTRIN) capsule 10 mg  10 mg Oral QHS   ??? rosuvastatin (CRESTOR) tablet 20 mg  20 mg Oral DAILY   ??? misoprostol (CYTOTEC) tablet 200 mcg  200 mcg Oral BID   ??? insulin glargine (LANTUS) injection 30 Units  30 Units SubCUTAneous ONCE   ??? prasugrel (EFFIENT) tablet 10 mg  10 mg Oral DAILY   ??? bumetanide (BUMEX) tablet 1 mg  1 mg Oral DAILY   ??? pantoprazole (PROTONIX) tablet 40 mg  40 mg Oral ACB   ??? glucagon (GLUCAGEN) injection 1 mg  1 mg IntraMUSCular PRN   ??? dextrose (D50W) injection Syrg 25 g  25 g IntraVENous PRN   ??? glucose chewable tablet 12 g  3 Tab Oral PRN   ??? enoxaparin (LOVENOX) injection 80 mg  80 mg SubCUTAneous Q24H   ??? insulin aspart (NOVOLOG)   SubCUTAneous AC&HS         Social History:  History   Substance Use Topics   ??? Smoking status: Former Smoker -- 20 years     Types: Cigarettes     Quit date: 11/09/1978   ??? Smokeless tobacco: Never Used   ??? Alcohol Use: No         Family History:  Family History   Problem Relation Age of Onset   ??? Hypertension     ??? Cancer Mother      cervical and colon   ??? Cancer Father      lung         Review of Systems:   Gen: Negative for fever, chills,weight changes   HEENT: No blurry vision   Respiratory: Negative for hemoptysis, + shortness of breaht (not at present)   Gastrointestinal: Negative for nausea, vomiting, diarrhea, constipation, blood in stool and melena.  Genitourinary: Negative and hematuria.   Skin: Negative for any bleeding   Heme: no bleeding gums, urine blood, etc   Neurological: Negative for speech change and focal weakness.     Objective:   Patient Vitals in the past 8 hrs:   BP Temp Pulse Resp SpO2 Height Weight   11/08/09 1337 114/58 mmHg 98.3 ??F (36.8 ??C) 88  18  99 % 5\' 6"  (1.676 m) 174 lb (78.926 kg)       In: 240 [P.O.:240]  Out: 550 [Urine:550]       EXAM:  In NAD   NEURO-alert, oriented x3, affect appropriate, no focal neurological deficits, moves all extremities well, no involuntary movements, reflexes at knee and ankle intact    HEENT-Head: Normocephalic, no lesions, without obvious abnormality.   LUNGS-clear to auscultation bilaterally    COR-regular rate and rhythym     ABD- soft, non-tender. Bowel sounds normal. No masses,  no organomegaly     EXT-no edema    Skin - No rash     Data Review     Recent Labs   Basename 11/08/09 1510 11/06/09   ??? WBC 6.0 6.9   ??? HGB 6.7* 7.4*   ??? HCT 19.9* 22.7*   ??? PLT 179 196       Recent Labs   Basename 11/08/09 1510 11/06/09   ??? NA 135* 137   ??? K 4.5 4.6   ??? CL 100 96*   ??? CO2 28 30   ??? BUN 48* 49*   ??? CREA 2.6* 2.57*   ??? GLU 272* 132*   ??? PHOS -- --   ??? CA 8.3* 8.6       Recent Labs   Basename 11/08/09 1510   ??? SGOT 25   ??? GPT 30   ??? AP 78   ??? TBIL 0.2   ??? TP 6.9   ??? ALB 3.3*   ??? GLOB 3.6   ??? GGT --   ??? AML --   ??? LPSE --       Patient Active Problem List   Diagnoses Code   ??? CHF (congestive heart failure) 428.0R   ??? CAD (coronary artery disease) 414.00AE   ??? Chronic kidney disease, stage III (moderate) 585.3   ??? Anemia, unspecified 285.9   ??? Back pain 724.5E   ??? DM renal manif type II 250.40   ??? PVD (peripheral vascular disease) 443.9AP   ??? Hyperlipidemia 272.4S   ??? HTN (hypertension) 401.9AF   ??? Unspecified hypothyroidism 244.9   ??? NSTEMI (non-ST elevated myocardial infarction) 410.70AF   ??? S/P coronary artery stent placement V45.82X       Assessment:   ?? Acute blood loss anemia with history of duodenitis and duodenal ulceration. Suspect he rebled from the duodenum being on aspirin and Effient.    Plan:   ?? Restart Protonix and continue daily.  ?? EGD in AM to reassess duodenal area. If clear, will proceed with capsule endoscopy.     Thanks for allowing me to participate in the care of this patient.  Signed By: Arman Filter, MD     11/08/2009  5:59 PM

## 2009-11-08 NOTE — Progress Notes (Signed)
Pt hard iv stick. Due to kidney disease history pt unable to have picc line. Called dr. Welton Flakes. Order for central line

## 2009-11-08 NOTE — Progress Notes (Signed)
Unable to assess pt--pt not in room at time of visit.  PC to f/u as available.    Visit by Rudean Hitt, M.Div., PRN Lunette Stands

## 2009-11-08 NOTE — Progress Notes (Signed)
Spoke with RN- [phone], creat 2.6, CKD stage 3. A HOHN or TL catheter is  more appropriate  for this pt to preserve upper arm veins. If ordering MD would prefer a PICC renal would need to clear pt for PICC line placement.

## 2009-11-08 NOTE — Progress Notes (Signed)
[facility] Dosing Services: 11/08/09    Pharmacist Renal Dosing Progress     The following medication, Lovenox was automatically dose-adjusted per [facility] P&T Committee Protocol, with respect to renal function.      Pt Weight: Wt Readings from Last 1 Encounters:   11/08/09 78.926 kg (174 lb)     Dosage changed to:  Lovenox 80 mg SQ q24h    Previous Regimen Lovenox 80 mg SQ q12h   Serum Creatinine Lab Results   Component Value Date/Time    Creatinine (POC) 3.1 09/29/09 04:00 PM    Creatinine 2.57 11/06/09 12:00 AM       Creatinine Clearance Estimated GFR: 26.4 ml/min (based on Cr of 2.57).   BUN Lab Results   Component Value Date/Time    BUN 49 11/06/09 12:00 AM    BUN (POC) 102 09/29/09 04:00 PM        Pharmacy to continue to monitor patient daily.   Will make dosage adjustments based upon changing renal function.  Signed Charolette Forward, Pharm.D.    Contact information:  [phone]

## 2009-11-08 NOTE — Progress Notes (Signed)
Pt arrived via w/c from md office by volunteer. Pt A&O x3 with no c/o pain. Pt's family at the bedside.

## 2009-11-08 NOTE — Progress Notes (Signed)
Bedside and Verbal shift change report given to Jessica Bright RN (oncoming nurse) by Shakisa Tyler RN (offgoing nurse).  Report given with SBAR, Kardex, MAR and Recent Results.

## 2009-11-08 NOTE — Consults (Signed)
Endocrinology    HPI: I am asked to see Travis [name] for evaluation and management of diabetes at the request of Dr. Welton Flakes. He has seen my colleague Dr. Manus Gunning once for diabetes management. Mr. Grattan is admitted for angina, near syncope and anemia. Taking Lantus and Novolog at home since recent discharge. However, he states that most fasting values have been in the 200 range. Eating a lot of fruit, but apparently insulin doses have been insufficient to cover this at home. On a regular diet now, but I suspect this may change if GI needs to do any procedures.     Past Medical History   Diagnosis Date   ??? CAD (coronary artery disease)      see problem list   ??? HTN (hypertension)    ??? PVD (peripheral vascular disease)    ??? DM (diabetes mellitus)    ??? Hyperlipidemia    ??? Sleep apnea      on CPAP   ??? COPD (chronic obstructive pulmonary disease)      mild COPD, recurrent pneumonia, chronic bronchitis (followed by Dr. Leroy Libman)   ??? CKD (chronic kidney disease)      Cr 1.7 in 10/09   ??? CHF (congestive heart failure)    ??? Anemia    ??? Pulmonary hypertension      PASP 60 on echo 06/22/09   ??? Thromboembolus      leg    ??? Thyroid disease    ??? Pneumonia    ??? GERD (gastroesophageal reflux disease)    ??? Chronic kidney disease    ??? Chronic pain      back pain   ??? Cataract    ??? Heart failure        History   Social History   ??? Marital Status: Single     Spouse Name: N/A     Number of Children: N/A   ??? Years of Education: N/A   Occupational History   ??? Not on file.   Social History Main Topics   ??? Smoking status: Former Smoker -- 20 years     Types: Cigarettes     Quit date: 11/09/1978   ??? Smokeless tobacco: Never Used   ??? Alcohol Use: No   ??? Drug Use: No   ??? Sexually Active: Not on file   Other Topics Concern   ??? Not on file   Social History Narrative   ??? No narrative on file       No Known Allergies    Current facility-administered medications   Medication Dose Route Frequency   ??? aspirin chewable tablet 81 mg  81 mg Oral DAILY    ??? levothyroxine (SYNTHROID) tablet 150 mcg  150 mcg Oral ACB   ??? nitroglycerin (NITROSTAT) tablet 0.4 mg  0.4 mg SubLINGual Q5MIN PRN   ??? terazosin (HYTRIN) capsule 10 mg  10 mg Oral QHS   ??? rosuvastatin (CRESTOR) tablet 20 mg  20 mg Oral DAILY   ??? misoprostol (CYTOTEC) tablet 200 mcg  200 mcg Oral BID   ??? insulin glargine (LANTUS) injection 30 Units  30 Units SubCUTAneous ONCE   ??? prasugrel (EFFIENT) tablet 10 mg  10 mg Oral DAILY   ??? bumetanide (BUMEX) tablet 1 mg  1 mg Oral DAILY   ??? pantoprazole (PROTONIX) tablet 40 mg  40 mg Oral ACB   ??? glucagon (GLUCAGEN) injection 1 mg  1 mg IntraMUSCular PRN   ??? dextrose (D50W) injection Syrg 25 g  25  g IntraVENous PRN   ??? glucose chewable tablet 12 g  3 Tab Oral PRN   ??? insulin aspart (NOVOLOG)   SubCUTAneous AC&HS   ??? enoxaparin (LOVENOX) injection 80 mg  80 mg SubCUTAneous Q24H       Review of Systems:   Gen:  Negative for fever, chills,weight changes  HEENT: No blurry vision  Respiratory: Negative for hemoptysis, + shortness of breaht (not at present)  Gastrointestinal: Negative for nausea, vomiting, diarrhea, constipation, blood in stool and melena.   Genitourinary: Negative  and hematuria.   Skin: Negative for any bleeding  Heme: no bleeding gums, urine blood, etc  Neurological: Negative for speech change and focal weakness.   Physical Exam:   Patient appears generally well, in no distress   HEENT: Normocephalic, atraumatic.   Neck Exam: Supple, no thyromegaly   Lung Exam: Clear to auscultation, nonlabored.   Cardiac Exam: Regular rate and rhythm with 2/6 systolic murmur.   Abdomen: Soft, non-tender  Extremities: trace lower  extremity edema       A/P    Diabetes Metllitus (250.02)  -Will resume Lantus 30 units daily  -Will increase sliding scale insulin regimen.   -Will hold standing novolog dose until diet status is more clear.    Will follow and adjust. Suspect initial doses are conservative, but this is intentional to avoid any hypoglycemia.      Thank you for asking me to see Mr. Chisenhall.

## 2009-11-08 NOTE — Progress Notes (Signed)
PIV started 20 G LAC, positional. Total of 3 sticks. Difficult to advance catheter. Pt has multiple areas of previous cannulations in varied stages of healing.

## 2009-11-08 NOTE — H&P (Signed)
Tharon Aquas, MD. [facility]  [address]  [place], [place] [address]  Ph [phone];   Fax [phone]      Patient: Travis [name]  DOB: 1941-04-23      History of Present Illness:  Mr. Saber is being admitted today for anemia associated with near syncope, lightheadedness and worsening chest pain (unstable angina).  His Hgb is down to 7.4.  He has a CAD and recently had multiple drug eluting stents placed.  Because of plavix resistance he is on effient.  He has had problems with feeling weak the past couple of weeks.  Hgb 2 weeks ago was above 10.  Yesterday it was 7.4.  He denies any obvious bleeding.  He also has had chest pain requiring 5 SL NTG tabs over the weekend.  We are admitting for PRBC transfusion and GI workup.  He has not followed up with GI as an outpatient - he was not discharged on a PPI after his recent hospital admission.    Past Medical History   Diagnosis Date   ??? CAD (coronary artery disease)      see problem list   ??? HTN (hypertension)    ??? PVD (peripheral vascular disease)    ??? DM (diabetes mellitus)    ??? Hyperlipidemia    ??? Sleep apnea      on CPAP   ??? COPD (chronic obstructive pulmonary disease)      mild COPD, recurrent pneumonia, chronic bronchitis (followed by Dr. Leroy Libman)   ??? CKD (chronic kidney disease)      Cr 1.7 in 10/09   ??? CHF (congestive heart failure)    ??? Anemia    ??? Pulmonary hypertension      PASP 60 on echo 06/22/09       Cardiac History:    Mr. Steen has known coronary artery disease dating back to May 1999 when he underwent cardiac catheterization prior to planned peripheral vascular surgery. This showed distal disease in the apical LAD and right coronary artery with a normal ejection fraction.Non q MI in 7/06.cath showed mod LAD abn RCA disease up to 50-60% and a small occluded LCX with normal LVEF. Had elevated troponin (?NSTEMI) during admission with pneumonia and ARF 05/2009 - proceeded to have a PCI (DES) to RCA. He returned for a NSTEMI (trop ~30) on 06/21/09 but cath showed stable findings. NSTEMI with another cath in 2/11 - the stent was patent and no new findings. Multiple NSTEMI's in 5/11 - proceeded on 10/16/09 to PCI of 85% ulcerated plaque mid RCA successfully stented with 2.75 Xience. CTO Cx successful PTCA and DES of proximal part with 2.25 Atom.     Prior Cardiac Eval:   X) Cath 2003 and 2006 - chronic LCX occlusion   1) stress test 02/09/07. The type of test was a dobutamine stress perfusion imaging. Cardiac stress testing was negative for chest pain and myocardial ischemia. Myocardial perfusion imaging showed reversible defect(s) involving the inferior wall. Gated EF (%): 55.   2) echocardiogram 01/19/07. Echocardiogram demonstrated normal LV wall motion and ejection fraction, LVH and left atrial enlargement.   3) Carotid Dopplers in July 2009 showed bilateral 50-79% stenosis.   4) Carotids 07/08/08 - 50-79% stenosis bilat   5) Echo 04/03/09 - TDS, moderate LVH, LVEF 65%; moderate LAE, RAE, mild AS (mean gradient 10), mild MR   6) Echo 1/11 - mild LVH, LAE, EF 55%, mild Pulm HTN, mild MR   7) Cath 06/05/09- DES  to RCA   8) cath 06/22/09 - LAD 50%, LCX 100%, mid RCA patent stent, distal RCA 40%   9) Echo 06/22/09 - mild LVH, EF 55-60%, mild LAE, mild MR/TR, PASP 60   10) Cath 07/18/09 - patent stent and stable findings; EF 60%    11) Cath 10/16/09 - 85% ulcerated plaque mid RCA successfully stented with 2.75 Xience. CTO Cx successful PTCA and DES of proximal part with 2.25 Atom.     No current facility-administered medications on file prior to encounter.     Current outpatient prescriptions ordered prior to encounter   Medication Sig Dispense Refill   ??? bumetanide (BUMEX) 2 mg tablet Take 0.5 Tabs by mouth daily.  30 Tab  0   ??? prasugrel (EFFIENT) 10 mg tablet Take 1 Tab by mouth daily. FIRST DOSE TODAY SHOULD BE 60 MG  45 Tab  4   ??? insulin glargine (LANTUS) 100 unit/mL injection 30 Units by SubCUTAneous route once.       ??? misoprostol (CYTOTEC) 200 mcg tablet Take 1 Tab by mouth two (2) times a day.  60 Tab  3   ??? rosuvastatin (CRESTOR) 20 mg tablet Take 20 mg by mouth daily.       ??? terazosin (HYTRIN) 5 mg capsule Take 10 mg by mouth nightly.       ??? aspirin 81 mg tablet Take  by mouth daily.       ??? omega-3 fatty acids-vitamin e (FISH OIL) 1,000 mg Cap Take  by mouth two (2) times a day.       ??? levothyroxine (SYNTHROID) 150 mcg tablet Take  by mouth daily (before breakfast).       ??? METOPROLOL TARTRATE PO Take 25 mg by mouth two (2) times a day.       ??? nitroglycerin (NITROSTAT) 0.4 mg SL tablet by SubLINGual route every five (5) minutes as needed.       ??? insulin aspart (NOVOLOG) 100 unit/mL injection by SubCUTAneous route.       ??? ranitidine hcl (ZANTAC) 150 mg capsule Take 150 mg by mouth two (2) times a day.           No Known Allergies    History   Substance Use Topics   ??? Smoking status: Former Smoker -- 20 years     Types: Cigarettes   ??? Smokeless tobacco: Not on file   ??? Alcohol Use: No       Family History   Problem Relation Age of Onset   ??? Hypertension         Review of Systems:   Constitutional: Negative for fever, chills, and diaphoresis.   HEENT: Negative for nosebleeds, congestion, neck pain, tinnitus, and vision changes.   Respiratory: Negative for hemoptysis, sputum production, and wheezing.    Gastrointestinal: Negative for nausea, vomiting, diarrhea, constipation, blood in stool and melena.   Genitourinary: Negative for dysuria,  and hematuria.   Musculoskeletal: Negative for myalgias.   Skin: Negative for rash and itching.   Heme: Does not bleed or bruise easily.   Neurological: Negative for speech change and focal weakness.     Physical Exam:   BP 114/58   Pulse 88   Temp 98.3 ??F (36.8 ??C)   Resp 18   Ht 5\' 6"  (1.676 m)   Wt 174 lb (78.926 kg)   BMI 28.08 kg/m2   SpO2 99%   Patient appears generally well, mood and affect are appropriate and pleasant.  HEENT: Normocephalic, atraumatic.   Neck Exam: Supple, Left carotid bruit.   Lung Exam: Clear to auscultation, even breath sounds.   Cardiac Exam: Regular rate and rhythm with 2/6 systolic murmur.   Abdomen: Soft, non-tender, normal bowel sounds. No bruits or masses.   Extremities: mild lower extremity edema   Vascular: difficult to palpate dorsalis pedis pulses bilaterally.     Lab Results   Component Value Date/Time    WBC 6.9 11/06/09 12:00 AM    Hemoglobin (POC) 9.5 09/29/09 04:00 PM    HGB 7.4 11/06/09 12:00 AM    Hematocrit (POC) 28 09/29/09 04:00 PM    HCT 22.7 11/06/09 12:00 AM    PLATELET 196 11/06/09 12:00 AM    MCV 91 11/06/09 12:00 AM       Lab Results   Component Value Date/Time    Sodium 137 11/06/09 12:00 AM    Potassium 4.6 11/06/09 12:00 AM    Chloride 96 11/06/09 12:00 AM    CO2 30 11/06/09 12:00 AM    Anion gap 8 10/17/09 04:18 AM    Glucose 132 11/06/09 12:00 AM    BUN 49 11/06/09 12:00 AM    Creatinine 2.57 11/06/09 12:00 AM    BUN/Creatinine ratio 19 11/06/09 12:00 AM    GFR est non-AA 24 11/06/09 12:00 AM    Calcium 8.6 11/06/09 12:00 AM    GFR est AA 28 11/06/09 12:00 AM           Assessment and Plan:  1) Anemia    - will transfuse to keep Hgb > 10 given his unstable angina.   - will ask GI to see.  He was on a PPI in the past, but not discharged on one a few weeks ago.     - because of his recently placed multiple drug eluting stents, we would not want to stop the Effient/ASA until 09/2010 unless it was absolutely necessary (high risk of acute stent thrombosis).      2) CAD   - unstable angina possibly related to drop in Hgb.  Will see how he does after transfusion    3) ARF - re-evaluate after volume repletion    4) Diabetes - ask the diabetes team to help manage    5) MIsc - will get a PICC line.  Mr. Nelles was very reluctant to be admitted and refuses to get repeated IV sticks    Tharon Aquas, MD, [facility]

## 2009-11-09 LAB — TYPE + CROSSMATCH
ABO/Rh(D): O POS
Antibody screen: NEGATIVE
Status of unit: TRANSFUSED
Status of unit: TRANSFUSED
Unit division: 0
Unit division: 0

## 2009-11-09 LAB — METABOLIC PANEL, BASIC
Anion gap: 8 mmol/L (ref 5–15)
BUN/Creatinine ratio: 19 (ref 12–20)
BUN: 37 MG/DL — ABNORMAL HIGH (ref 6–20)
CO2: 28 MMOL/L (ref 21–32)
Calcium: 7.9 MG/DL — ABNORMAL LOW (ref 8.5–10.1)
Chloride: 93 MMOL/L — ABNORMAL LOW (ref 97–108)
Creatinine: 2 MG/DL — ABNORMAL HIGH (ref 0.6–1.3)
GFR est AA: 43 mL/min/{1.73_m2} — ABNORMAL LOW (ref >60)
GFR est non-AA: 35 mL/min/{1.73_m2} — ABNORMAL LOW (ref >60)
Glucose: 430 MG/DL — ABNORMAL HIGH (ref 65–100)
Potassium: 3.5 MMOL/L (ref 3.5–5.1)
Sodium: 129 MMOL/L — ABNORMAL LOW (ref 136–145)

## 2009-11-09 LAB — CBC W/O DIFF
HCT: 26.8 % — ABNORMAL LOW (ref 36.6–50.3)
HGB: 9.1 g/dL — ABNORMAL LOW (ref 12.1–17.0)
MCH: 29.4 PG (ref 26.0–34.0)
MCHC: 34 g/dL (ref 30.0–36.5)
MCV: 86.5 FL (ref 80.0–99.0)
PLATELET: 150 10*3/uL (ref 150–400)
RBC: 3.1 M/uL — ABNORMAL LOW (ref 4.10–5.70)
RDW: 18.1 % — ABNORMAL HIGH (ref 11.5–14.5)
WBC: 6.5 10*3/uL (ref 4.1–11.1)

## 2009-11-09 LAB — GLUCOSE, POC
Glucose (POC): 244 mg/dL — ABNORMAL HIGH (ref 75–110)
Glucose (POC): 55 mg/dL — ABNORMAL LOW (ref 75–110)
Glucose (POC): 56 mg/dL — ABNORMAL LOW (ref 75–110)
Glucose (POC): 150 mg/dL — ABNORMAL HIGH (ref 75–110)
Glucose (POC): 269 mg/dL — ABNORMAL HIGH (ref 75–110)
Glucose (POC): 305 mg/dL — ABNORMAL HIGH (ref 75–110)
Glucose (POC): 517 mg/dL — ABNORMAL HIGH (ref 75–110)

## 2009-11-09 MED ORDER — DEXTROSE 5% IN WATER (D5W) IV
INTRAVENOUS | Status: AC
Start: 2009-11-09 — End: 2009-11-09
  Administered 2009-11-09: 13:00:00 via INTRAVENOUS

## 2009-11-09 MED ORDER — MIDAZOLAM 1 MG/ML IJ SOLN
1 mg/mL | INTRAMUSCULAR | Status: DC | PRN
Start: 2009-11-09 — End: 2009-11-09
  Administered 2009-11-09 (×3): via INTRAVENOUS

## 2009-11-09 MED ORDER — EPOETIN ALFA 20,000 UNIT/ML IJ SOLN
20000 unit/mL | INTRAMUSCULAR | Status: DC
Start: 2009-11-09 — End: 2009-11-13
  Administered 2009-11-09: 23:00:00 via SUBCUTANEOUS

## 2009-11-09 MED ORDER — DEXTROSE 5% IN NORMAL SALINE IV
INTRAVENOUS | Status: AC
Start: 2009-11-09 — End: 2009-11-09

## 2009-11-09 MED ORDER — FENTANYL CITRATE (PF) 50 MCG/ML IJ SOLN
50 mcg/mL | INTRAMUSCULAR | Status: DC | PRN
Start: 2009-11-09 — End: 2009-11-09
  Administered 2009-11-09 (×2): via INTRAVENOUS

## 2009-11-09 MED ORDER — SODIUM CHLORIDE 0.9 % IV
INTRAVENOUS | Status: DC
Start: 2009-11-09 — End: 2009-11-09

## 2009-11-09 MED ORDER — MIDAZOLAM 1 MG/ML IJ SOLN
1 mg/mL | INTRAMUSCULAR | Status: AC
Start: 2009-11-09 — End: 2009-11-09

## 2009-11-09 MED FILL — INSULIN ASPART 100 UNIT/ML INJECTION: 100 unit/mL | SUBCUTANEOUS | Qty: 1

## 2009-11-09 MED FILL — MISOPROSTOL 200 MCG TAB: 200 mcg | ORAL | Qty: 1

## 2009-11-09 MED FILL — DEXTROSE 50% IN WATER (D50W) IV SYRG: INTRAVENOUS | Qty: 50

## 2009-11-09 MED FILL — DEXTROSE 5% IN WATER (D5W) IV: INTRAVENOUS | Qty: 1000

## 2009-11-09 MED FILL — PANTOPRAZOLE 40 MG TAB, DELAYED RELEASE: 40 mg | ORAL | Qty: 1

## 2009-11-09 MED FILL — EFFIENT 10 MG TABLET: 10 mg | ORAL | Qty: 1

## 2009-11-09 MED FILL — MIDAZOLAM 1 MG/ML IJ SOLN: 1 mg/mL | INTRAMUSCULAR | Qty: 10

## 2009-11-09 MED FILL — BUMETANIDE 1 MG TAB: 1 mg | ORAL | Qty: 1

## 2009-11-09 MED FILL — TERAZOSIN 5 MG CAP: 5 mg | ORAL | Qty: 2

## 2009-11-09 MED FILL — ASPIRIN 81 MG CHEWABLE TAB: 81 mg | ORAL | Qty: 1

## 2009-11-09 MED FILL — EPOETIN ALFA 20,000 UNIT/ML IJ SOLN: 20000 unit/mL | INTRAMUSCULAR | Qty: 1

## 2009-11-09 MED FILL — FENTANYL CITRATE (PF) 50 MCG/ML IJ SOLN: 50 mcg/mL | INTRAMUSCULAR | Qty: 2

## 2009-11-09 MED FILL — LEVOTHYROXINE 150 MCG TAB: 150 mcg | ORAL | Qty: 1

## 2009-11-09 MED FILL — SODIUM CHLORIDE 0.9 % IV: INTRAVENOUS | Qty: 250

## 2009-11-09 MED FILL — DEXTROSE 5% IN NORMAL SALINE IV: INTRAVENOUS | Qty: 1000

## 2009-11-09 NOTE — Progress Notes (Signed)
Dr. Vear Clock did not call back after being paged via answering service x 3. Will continue to monitor pt's blood sugar

## 2009-11-09 NOTE — Other (Signed)
Received patient from D. Ralph Leyden, RN.  Patient is awake but drowsy.  Patient denies any pain or discomfort.  Abdomen soft, non-tender to palpation.  RR even and unlabored on room air.  VSS.  Patient in paced rhythm.

## 2009-11-09 NOTE — Procedures (Signed)
[place] [facility]  Delana Meyer, M.D.  [phone]              11/09/2009                EGD Operative Report  Travis [name]  DOB:  June 26, 1940  Sondra Barges Medical Record Number:  [id number]      Indication:  Acute blood loss anemia     Operator: Arman Filter, MD    Referring Provider:  Jeral Fruit, MD      Anesthesia/Sedation:  Versed 4 mg IV and Fentanyl 75 mcg IV    Airway assessment: No airway problems anticipated    Pre-Procedural Exam:      Airway: clear, no airway problems anticipated  Heart: RRR, without gallops or rubs  Lungs: clear bilaterally without wheezes, crackles, or rhonchi  Abdomen: soft, nontender, nondistended, bowel sounds present  Mental Status: awake, alert and oriented to person, place and time       Procedure Details     After infomed consent was obtained for the procedure, with all risks and benefits of procedure explained the patient was taken to the endoscopy suite and placed in the left lateral decubitus position.  Following sequential administration of sedation as per above, the endoscope was inserted into the mouth and advanced under direct vision to second portion of the duodenum.  A careful inspection was made as the gastroscope was withdrawn, including a retroflexed view of the proximal stomach; findings and interventions are described below.      Findings:   Esophagus:normal  Stomach: Minimal gastritis with few coffee ground specs seen.  Duodenum/jejunum: Minimal duodenitis, no ulcers seen, better than last exam    Therapies:  none    Specimens: none           Complications:   None; patient tolerated the procedure well.    EBL:  None.           Impression:    Minimal Gastritis                         Minimal duodenitis      Recommendations:    -Continue acid suppression.  -Will proceed with capsule endoscopy in AM.      Arman Filter, MD

## 2009-11-09 NOTE — Other (Signed)
Pt received in endo unit with central line dressing with red drainage. Transparent dressing intact. Site around dressing is pink without swelling

## 2009-11-09 NOTE — Other (Signed)
Prior to procedure FSBS done it was 150. Pt awake anad alert and oriented x4.Pt states no s&s of hypogllycemia

## 2009-11-09 NOTE — Consults (Signed)
[facility]  Consultation    Assessment:     1.HYQ:MVHQIONGEXB:MWU to GI bleed  2.CKD 3:Baseline cr around 1.5 to 1.8 mg%  3.Anemia:GI bleed +/- Anemia of CKD  4.CAD  5.HTN  6.DM  7.Hyponatremia:most likely hypovolemic,monitor.Urine and serum lytes if Na fails to improve        Plan:     1.Procrit while in the hospital  2.Apply for out pt Procrit  3.PRBCs as needed to keep Hb >8  4.CKD follow up  5. HTN,DM control      Subjective:     HPI:  69 yrs old AAM came to hospital with anemia and Possible GI bleed.He had angina due to Low Hb  He has AKI with Cr 2.7 mg% up from baseline of 1.7 mg%.Interestingly he had high cr earlier this month of 2.8 g%,even before this admission.  He has recent recurrent hospital admission due to Cardia issues.  He was supposed to see [name] in the clinic but unfortunately ended up in the hospital.  He denies any bleeding.  No urinary complaints.  No documented hypotension.  Got 2 unit PRBCs.    Review of Systems  A comprehensive review of systems was negative except for that written in the HPI.    Past Medical History   Diagnosis Date   ??? CAD (coronary artery disease)      see problem list   ??? HTN (hypertension)    ??? PVD (peripheral vascular disease)    ??? DM (diabetes mellitus)    ??? Hyperlipidemia    ??? Sleep apnea      on CPAP   ??? COPD (chronic obstructive pulmonary disease)      mild COPD, recurrent pneumonia, chronic bronchitis (followed by Dr. Leroy Libman)   ??? CKD (chronic kidney disease)      Cr 1.7 in 10/09   ??? CHF (congestive heart failure)    ??? Anemia    ??? Pulmonary hypertension      PASP 60 on echo 06/22/09   ??? Thromboembolus      leg    ??? Thyroid disease    ??? Pneumonia    ??? GERD (gastroesophageal reflux disease)    ??? Chronic kidney disease    ??? Chronic pain      back pain   ??? Cataract    ??? Heart failure         Past Surgical History   Procedure Date   ??? Hx orthopaedic      knee replacement   ??? Vascular surgery procedure unlist      arterial bypass   ??? Hx pacemaker     ??? Cardiac surg procedure unlist      cardiac stent   ??? Hx cataract removal      removed rt eye         History   Social History   ??? Marital Status: Single     Spouse Name: N/A     Number of Children: N/A   ??? Years of Education: N/A   Occupational History   ??? Not on file.   Social History Main Topics   ??? Smoking status: Former Smoker -- 20 years     Types: Cigarettes     Quit date: 11/09/1978   ??? Smokeless tobacco: Never Used   ??? Alcohol Use: No   ??? Drug Use: No   ??? Sexually Active: Not on file   Other Topics Concern   ??? Not on file  Social History Narrative   ??? No narrative on file        Family History   Problem Relation Age of Onset   ??? Hypertension     ??? Cancer Mother      cervical and colon   ??? Cancer Father      lung        Prior to Admission medications    Medication Sig Start Date End Date Taking? Authorizing Provider   insulin glargine (LANTUS) 100 unit/mL injection 25 Units by SubCUTAneous route once.   Yes Historical Provider   bumetanide (BUMEX) 2 mg tablet Take 0.5 Tabs by mouth daily. 11/06/09  Yes Laddie Aquas, MD   prasugrel (EFFIENT) 10 mg tablet Take 1 Tab by mouth daily. FIRST DOSE TODAY SHOULD BE 60 MG 10/21/09  Yes Rushie Chestnut, MD   insulin glargine (LANTUS) 100 unit/mL injection 30 Units by SubCUTAneous route once.   Yes Phys Other, MD   misoprostol (CYTOTEC) 200 mcg tablet Take 1 Tab by mouth two (2) times a day. 07/24/09  Yes Laddie Aquas, MD   rosuvastatin (CRESTOR) 20 mg tablet Take 20 mg by mouth daily.   Yes Historical Provider   terazosin (HYTRIN) 5 mg capsule Take 10 mg by mouth nightly.   Yes Historical Provider   aspirin 81 mg tablet Take  by mouth daily.   Yes Historical Provider   omega-3 fatty acids-vitamin e (FISH OIL) 1,000 mg Cap Take  by mouth two (2) times a day.   Yes Historical Provider   levothyroxine (SYNTHROID) 150 mcg tablet Take  by mouth daily (before breakfast).   Yes Historical Provider    METOPROLOL TARTRATE PO Take 25 mg by mouth two (2) times a day.   Yes Historical Provider   nitroglycerin (NITROSTAT) 0.4 mg SL tablet by SubLINGual route every five (5) minutes as needed.   Yes Historical Provider   insulin aspart (NOVOLOG) 100 unit/mL injection by SubCUTAneous route.   Yes Historical Provider   ranitidine hcl (ZANTAC) 150 mg capsule Take 150 mg by mouth two (2) times a day.   Yes Historical Provider       No Known Allergies    Objective:     Vitals:  Blood pressure 151/64, pulse 80, temperature 98.2 ??F (36.8 ??C), resp. rate 20, height 5\' 6"  (1.676 m), weight 174 lb (78.926 kg), SpO2 97.00%.  Temp (24hrs), Avg:98.4 ??F (36.9 ??C), Min:98 ??F (36.7 ??C), Max:98.7 ??F (37.1 ??C)      Intake and Output:  In: 520 [P.O.:120; I.V.:400]  Out: 475 [Urine:475]  In: 900 [P.O.:240]  Out: 1225 [Urine:1225]    Physical Exam:                Patient is not currently intubated    GEN: NAD   RS: CTA, negative for crackles   CVS: S1S2  ABD: SOH, Non Tender  GU:  No Foley   EXT: trace Edema  SKIN: No Rash   NEURO: Grossly focal      ECG: rev    Data Review Recent Results (from the past 72 hour(s))   METABOLIC PANEL, COMPREHENSIVE    Collection Time    11/08/09  3:10 PM   Component Value Range   ??? Sodium 135 (*) 136 - 145 (MMOL/L)   ??? Potassium 4.5  3.5 - 5.1 (MMOL/L)   ??? Chloride 100  97 - 108 (MMOL/L)   ??? CO2 28  21 - 32 (MMOL/L)   ??? Anion gap 7  5 - 15 (mmol/L)   ??? Glucose 272 (*) 65 - 100 (MG/DL)   ??? BUN 48 (*) 6 - 20 (MG/DL)   ??? Creatinine 2.6 (*) 0.6 - 1.3 (MG/DL)   ??? BUN/Creatinine ratio 18  12 - 20 ( )   ??? GFR est AA 32 (*) >60 (ml/min/1.54m2)   ??? GFR est non-AA 26 (*) >60 (ml/min/1.25m2)   ??? Calcium 8.3 (*) 8.5 - 10.1 (MG/DL)   ??? Bilirubin, total 0.2  0.2 - 1.0 (MG/DL)   ??? ALT 30  12 - 78 (U/L)   ??? AST 25  15 - 37 (U/L)   ??? Alk. phosphatase 78  50 - 136 (U/L)   ??? Protein, total 6.9  6.4 - 8.2 (g/dL)   ??? Albumin 3.3 (*) 3.5 - 5.0 (g/dL)   ??? Globulin 3.6  2.0 - 4.0 (g/dL)   ??? A-G Ratio 0.9 (*) 1.1 - 2.2 ( )    CBC W/O DIFF    Collection Time    11/08/09  3:10 PM   Component Value Range   ??? WBC 6.0  4.1 - 11.1 (K/uL)   ??? RBC 2.28 (*) 4.10 - 5.70 (M/uL)   ??? HGB 6.7 (*) 12.1 - 17.0 (g/dL)   ??? HCT 19.9 (*) 36.6 - 50.3 (%)   ??? MCV 87.3  80.0 - 99.0 (FL)   ??? MCH 29.4  26.0 - 34.0 (PG)   ??? MCHC 33.7  30.0 - 36.5 (g/dL)   ??? RDW 17.3 (*) 11.5 - 14.5 (%)   ??? PLATELET 179  150 - 400 (K/uL)   TYPE & CROSSMATCH    Collection Time    11/08/09  3:10 PM   Component Value Range   ??? Crossmatch Expiration 11/11/2009     ??? ABO/Rh(D) O POS     ??? Antibody screen NEG     ??? Unit number [id number]     ??? Blood component type RC LR AS5     ??? Unit division 00     ??? Status of unit TRANSFUSED     ??? Crossmatch result COMPATIBLE     ??? Unit number [id number]     ??? Blood component type RC LR AS5     ??? Unit division 00     ??? Status of unit TRANSFUSED     ??? Crossmatch result COMPATIBLE     TROPONIN I    Collection Time    11/08/09  3:10 PM   Component Value Range   ??? Troponin-I, Qt. 0.04  <0.05 (ng/mL)   GLUCOSE, POC    Collection Time    11/08/09  4:03 PM   Component Value Range   ??? POC GLUCOSE 208 (*) 75 - 110 (mg/dL)   GLUCOSE, POC    Collection Time    11/08/09  8:39 PM   Component Value Range   ??? POC GLUCOSE 244 (*) 75 - 110 (mg/dL)   GLUCOSE, POC    Collection Time    11/09/09  7:52 AM   Component Value Range   ??? POC GLUCOSE 55 (*) 75 - 110 (mg/dL)   GLUCOSE, POC    Collection Time    11/09/09  7:54 AM   Component Value Range   ??? POC GLUCOSE 56 (*) 75 - 110 (mg/dL)   GLUCOSE, POC    Collection Time    11/09/09  8:14 AM   Component Value Range   ??? POC GLUCOSE 269 (*) 75 - 110 (mg/dL)  GLUCOSE, POC    Collection Time    11/09/09  8:54 AM   Component Value Range   ??? POC GLUCOSE 150 (*) 75 - 110 (mg/dL)   METABOLIC PANEL, BASIC    Collection Time    11/09/09 10:50 AM   Component Value Range   ??? Sodium 129 (*) 136 - 145 (MMOL/L)   ??? Potassium 3.5  3.5 - 5.1 (MMOL/L)   ??? Chloride 93 (*) 97 - 108 (MMOL/L)   ??? CO2 28  21 - 32 (MMOL/L)    ??? Anion gap 8  5 - 15 (mmol/L)   ??? Glucose 430 (*) 65 - 100 (MG/DL)   ??? BUN 37 (*) 6 - 20 (MG/DL)   ??? Creatinine 2.0 (*) 0.6 - 1.3 (MG/DL)   ??? BUN/Creatinine ratio 19  12 - 20 ( )   ??? GFR est AA 43 (*) >60 (ml/min/1.86m2)   ??? GFR est non-AA 35 (*) >60 (ml/min/1.35m2)   ??? Calcium 7.9 (*) 8.5 - 10.1 (MG/DL)   CBC W/O DIFF    Collection Time    11/09/09 10:50 AM   Component Value Range   ??? WBC 6.5  4.1 - 11.1 (K/uL)   ??? RBC 3.10 (*) 4.10 - 5.70 (M/uL)   ??? HGB 9.1 (*) 12.1 - 17.0 (g/dL)   ??? HCT 26.8 (*) 36.6 - 50.3 (%)   ??? MCV 86.5  80.0 - 99.0 (FL)   ??? MCH 29.4  26.0 - 34.0 (PG)   ??? MCHC 34.0  30.0 - 36.5 (g/dL)   ??? RDW 18.1 (*) 11.5 - 14.5 (%)   ??? PLATELET 150  150 - 400 (K/uL)   GLUCOSE, POC    Collection Time    11/09/09 11:17 AM   Component Value Range   ??? POC GLUCOSE 305 (*) 75 - 110 (mg/dL)         Discussed with:    Patient and Attending/Consulting

## 2009-11-09 NOTE — Progress Notes (Signed)
Paged Dr. Vear Clock re: evening blood sugar of 512.

## 2009-11-09 NOTE — Progress Notes (Signed)
[facility] 11/09/09  Consult received for help with management of blood sugars.  Endocrinology saw yesterday (11/08/09) and started pt on 30 units Lantus and correctional insulin.  Pt with low blood sugar this morning (55mg /dl); pt was NPO.  Pt had upper GI endoscopy this morning and blood sugar was 150mg /dl.  Blood sugar before lunch today was 307mg /dl and pt was given 5 units of insulin.  Pt on diabetic diet and will be NPO at midnight for capsule endoscopy planned for tomorrow (Friday 11/10/09).  We will monitor pts blood sugar trends closely and offer recommendations, if needed.      Thank you for this consult.      Larose Batres K. Fulton Mole, RD, CDE

## 2009-11-09 NOTE — Progress Notes (Signed)
Spoke with Dr. Welton Flakes at nurses' station about capsule study tomorrow. Dr. Welton Flakes stated its ok for pt. To be off telemetry for 8 hours for gi capsule study.

## 2009-11-09 NOTE — Progress Notes (Signed)
Paging Dr. Vear Clock again for third time regarding blood sugar in 500s.

## 2009-11-09 NOTE — Progress Notes (Signed)
Bedside and Verbal shift change report given to Kathlyn Sacramento RN (oncoming nurse) by Twanna Hy RN (offgoing nurse).  Report given with SBAR, Kardex, MAR and Recent Results.

## 2009-11-09 NOTE — Other (Signed)
Pt tolerated procedure well.lace on O2 for H/O sleep apnea. No esp distress. No S&S of perforation or bleed. Abd soft without distention.Pt follows commands

## 2009-11-09 NOTE — Progress Notes (Signed)
Cardiopulmonary Rehab:  Patient has past medical history of CHF noted in chart.  Patient was seen in May by Cardiac Rehab nurse and refused the CHF packet.  "I have several of those at home."  Patient states that he has been weighing himself daily and has been watching his salt.  Patient denies wanting any information about his current condition.  Will continue to follow.

## 2009-11-09 NOTE — Progress Notes (Addendum)
Tharon Aquas, MD. [facility]  [address]  [place], [place] [address]  Ph [phone];   Fax [phone]      Patient: Travis [name]  DOB: 20-Aug-1940      PROGRESS NOTE  S: Feels better this AM after blood transfusion.  No specific complaints.  O:BP 148/65   Pulse 80   Temp 98.2 ??F (36.8 ??C)   Resp 18   Ht 5\' 6"  (1.676 m)   Wt 174 lb (78.926 kg)   BMI 28.08 kg/m2   SpO2 97%   Patient appears generally well, mood and affect are appropriate and pleasant.   HEENT: Normocephalic, atraumatic.   Neck Exam: Supple, Left carotid bruit.   Lung Exam: course sounds at bases  Cardiac Exam: Regular rate and rhythm with 2/6 systolic murmur.   Abdomen: Soft, non-tender, normal bowel sounds. No bruits or masses.   Extremities: mild lower extremity edema   Vascular: difficult to palpate dorsalis pedis pulses bilaterally.     Recent Results (from the past 24 hour(s))   METABOLIC PANEL, COMPREHENSIVE    Collection Time    11/08/09  3:10 PM   Component Value Range   ??? Sodium 135 (*) 136 - 145 (MMOL/L)   ??? Potassium 4.5  3.5 - 5.1 (MMOL/L)   ??? Chloride 100  97 - 108 (MMOL/L)   ??? CO2 28  21 - 32 (MMOL/L)   ??? Anion gap 7  5 - 15 (mmol/L)   ??? Glucose 272 (*) 65 - 100 (MG/DL)   ??? BUN 48 (*) 6 - 20 (MG/DL)   ??? Creatinine 2.6 (*) 0.6 - 1.3 (MG/DL)   ??? BUN/Creatinine ratio 18  12 - 20 ( )   ??? GFR est AA 32 (*) >60 (ml/min/1.6m2)   ??? GFR est non-AA 26 (*) >60 (ml/min/1.63m2)   ??? Calcium 8.3 (*) 8.5 - 10.1 (MG/DL)   ??? Bilirubin, total 0.2  0.2 - 1.0 (MG/DL)   ??? ALT 30  12 - 78 (U/L)   ??? AST 25  15 - 37 (U/L)   ??? Alk. phosphatase 78  50 - 136 (U/L)   ??? Protein, total 6.9  6.4 - 8.2 (g/dL)   ??? Albumin 3.3 (*) 3.5 - 5.0 (g/dL)   ??? Globulin 3.6  2.0 - 4.0 (g/dL)   ??? A-G Ratio 0.9 (*) 1.1 - 2.2 ( )   CBC W/O DIFF    Collection Time    11/08/09  3:10 PM   Component Value Range   ??? WBC 6.0  4.1 - 11.1 (K/uL)   ??? RBC 2.28 (*) 4.10 - 5.70 (M/uL)   ??? HGB 6.7 (*) 12.1 - 17.0 (g/dL)   ??? HCT 19.9 (*) 36.6 - 50.3 (%)    ??? MCV 87.3  80.0 - 99.0 (FL)   ??? MCH 29.4  26.0 - 34.0 (PG)   ??? MCHC 33.7  30.0 - 36.5 (g/dL)   ??? RDW 17.3 (*) 11.5 - 14.5 (%)   ??? PLATELET 179  150 - 400 (K/uL)   TYPE & CROSSMATCH    Collection Time    11/08/09  3:10 PM   Component Value Range   ??? Crossmatch Expiration 11/11/2009     ??? ABO/Rh(D) O POS     ??? Antibody screen NEG     ??? Unit number [id number]     ??? Blood component type RC LR AS5     ??? Unit division 00     ???  Status of unit TRANSFUSED     ??? Crossmatch result COMPATIBLE     ??? Unit number [id number]     ??? Blood component type RC LR AS5     ??? Unit division 00     ??? Status of unit TRANSFUSED     ??? Crossmatch result COMPATIBLE     TROPONIN I    Collection Time    11/08/09  3:10 PM   Component Value Range   ??? Troponin-I, Qt. 0.04  <0.05 (ng/mL)   GLUCOSE, POC    Collection Time    11/08/09  4:03 PM   Component Value Range   ??? POC GLUCOSE 208 (*) 75 - 110 (mg/dL)   GLUCOSE, POC    Collection Time    11/08/09  8:39 PM   Component Value Range   ??? POC GLUCOSE 244 (*) 75 - 110 (mg/dL)   GLUCOSE, POC    Collection Time    11/09/09  7:52 AM   Component Value Range   ??? POC GLUCOSE 55 (*) 75 - 110 (mg/dL)   GLUCOSE, POC    Collection Time    11/09/09  7:54 AM   Component Value Range   ??? POC GLUCOSE 56 (*) 75 - 110 (mg/dL)       Assessment and Plan:  1) Anemia   - s/p transfusion yesterday.  Labs pending.    - EGD today   2) CAD   - trop negative yesterday; stable  3) ARF - re-evaluate after volume repletion   4) Diabetes - appreciate Endocrine help; hypoglycemic (56)  and symptomatic this morning as NPO - while NPO will give low rate D5 infusion  5) MIsc -Lovenox was ordered/given yesterday in error (incident report placed)    Tharon Aquas, MD, [facility]

## 2009-11-10 LAB — CBC W/O DIFF
HCT: 28.2 % — ABNORMAL LOW (ref 36.6–50.3)
HGB: 9.5 g/dL — ABNORMAL LOW (ref 12.1–17.0)
MCH: 29.1 PG (ref 26.0–34.0)
MCHC: 33.7 g/dL (ref 30.0–36.5)
MCV: 86.2 FL (ref 80.0–99.0)
PLATELET: 167 10*3/uL (ref 150–400)
RBC: 3.27 M/uL — ABNORMAL LOW (ref 4.10–5.70)
RDW: 17.9 % — ABNORMAL HIGH (ref 11.5–14.5)
WBC: 6.7 10*3/uL (ref 4.1–11.1)

## 2009-11-10 LAB — METABOLIC PANEL, BASIC
Anion gap: 10 mmol/L (ref 5–15)
BUN/Creatinine ratio: 21 — ABNORMAL HIGH (ref 12–20)
BUN: 33 MG/DL — ABNORMAL HIGH (ref 6–20)
CO2: 27 MMOL/L (ref 21–32)
Calcium: 8.6 MG/DL (ref 8.5–10.1)
Chloride: 101 MMOL/L (ref 97–108)
Creatinine: 1.6 MG/DL — ABNORMAL HIGH (ref 0.6–1.3)
GFR est AA: 55 mL/min/{1.73_m2} — ABNORMAL LOW (ref >60)
GFR est non-AA: 46 mL/min/{1.73_m2} — ABNORMAL LOW (ref >60)
Glucose: 111 MG/DL — ABNORMAL HIGH (ref 65–100)
Potassium: 3.9 MMOL/L (ref 3.5–5.1)
Sodium: 138 MMOL/L (ref 136–145)

## 2009-11-10 LAB — GLUCOSE, POC
Glucose (POC): 317 mg/dL — ABNORMAL HIGH (ref 75–110)
Glucose (POC): 57 mg/dL — ABNORMAL LOW (ref 75–110)
Glucose (POC): 83 mg/dL (ref 75–110)
Glucose (POC): 172 mg/dL — ABNORMAL HIGH (ref 75–110)
Glucose (POC): 397 mg/dL — ABNORMAL HIGH (ref 75–110)
Glucose (POC): 438 mg/dL — ABNORMAL HIGH (ref 75–110)
Glucose (POC): 252 mg/dL — ABNORMAL HIGH (ref 75–110)

## 2009-11-10 MED ORDER — INSULIN GLARGINE 100 UNIT/ML INJECTION
100 unit/mL | Freq: Every evening | SUBCUTANEOUS | Status: DC
Start: 2009-11-10 — End: 2009-11-13

## 2009-11-10 MED ORDER — INSULIN ASPART 100 UNIT/ML INJECTION
100 unit/mL | Freq: Four times a day (QID) | SUBCUTANEOUS | Status: DC
Start: 2009-11-10 — End: 2009-11-13
  Administered 2009-11-10 – 2009-11-13 (×12): via SUBCUTANEOUS

## 2009-11-10 MED ORDER — PANTOPRAZOLE 40 MG TAB, DELAYED RELEASE
40 mg | ORAL_TABLET | Freq: Every day | ORAL | Status: DC
Start: 2009-11-10 — End: 2009-12-27

## 2009-11-10 MED ORDER — SODIUM CHLORIDE 0.9 % IV
2 mg/mL | Freq: Four times a day (QID) | INTRAVENOUS | Status: DC | PRN
Start: 2009-11-10 — End: 2009-11-10

## 2009-11-10 MED ORDER — INSULIN ASPART 100 UNIT/ML INJECTION
100 unit/mL | Freq: Three times a day (TID) | SUBCUTANEOUS | Status: DC
Start: 2009-11-10 — End: 2010-11-14

## 2009-11-10 MED ORDER — INSULIN ASPART 100 UNIT/ML INJECTION
100 unit/mL | Freq: Three times a day (TID) | SUBCUTANEOUS | Status: DC
Start: 2009-11-10 — End: 2009-11-10

## 2009-11-10 MED ORDER — INSULIN GLARGINE 100 UNIT/ML INJECTION
100 unit/mL | Freq: Every day | SUBCUTANEOUS | Status: DC
Start: 2009-11-10 — End: 2009-11-12
  Administered 2009-11-12: 02:00:00 via SUBCUTANEOUS

## 2009-11-10 MED ORDER — INSULIN ASPART 100 UNIT/ML INJECTION
100 unit/mL | Freq: Three times a day (TID) | SUBCUTANEOUS | Status: DC
Start: 2009-11-10 — End: 2009-11-13
  Administered 2009-11-10 – 2009-11-13 (×9): via SUBCUTANEOUS

## 2009-11-10 MED FILL — ASPIRIN 81 MG CHEWABLE TAB: 81 mg | ORAL | Qty: 1

## 2009-11-10 MED FILL — LEVOTHYROXINE 150 MCG TAB: 150 mcg | ORAL | Qty: 1

## 2009-11-10 MED FILL — MISOPROSTOL 200 MCG TAB: 200 mcg | ORAL | Qty: 1

## 2009-11-10 MED FILL — EFFIENT 10 MG TABLET: 10 mg | ORAL | Qty: 1

## 2009-11-10 MED FILL — CRESTOR 10 MG TABLET: 10 mg | ORAL | Qty: 2

## 2009-11-10 MED FILL — INSULIN ASPART 100 UNIT/ML INJECTION: 100 unit/mL | SUBCUTANEOUS | Qty: 1

## 2009-11-10 MED FILL — PANTOPRAZOLE 40 MG TAB, DELAYED RELEASE: 40 mg | ORAL | Qty: 1

## 2009-11-10 MED FILL — TERAZOSIN 5 MG CAP: 5 mg | ORAL | Qty: 2

## 2009-11-10 MED FILL — GLUCOSE 4 GRAM CHEWABLE TAB: 4 gram | ORAL | Qty: 10

## 2009-11-10 MED FILL — BUMETANIDE 1 MG TAB: 1 mg | ORAL | Qty: 1

## 2009-11-10 NOTE — Progress Notes (Signed)
Pt remained asymptomatic during drop in blood sugar. Educated patient on our protocol along with being NPO for camera study. Patient took a total of 2 tablets. Recheck blood sugar was completed after treatment. Treatment took longer as patient was refusing treatment and had to be educated by two nurses (myself) and Press photographer.

## 2009-11-10 NOTE — Progress Notes (Signed)
Bedside and Verbal shift change report given to Clara RN (oncoming nurse) by Fabian Sharp Rn (offgoing nurse).  Report given with SBAR, Kardex, Intake/Output and Recent Results.

## 2009-11-10 NOTE — Progress Notes (Signed)
Pt given glucose tablet, however refused to take 3 tablets. Explained protocol and importance to patient. Pt refused d50 IV medication. One glucose tablet administered. Will recheck blood sugar in 15 minutes.

## 2009-11-10 NOTE — Progress Notes (Signed)
Gastrointestinal Progress Note    11/10/2009    Admit Date: 11/08/2009    Subjective:     Patient c/o "not feeling right today."  Reports having discomfort substernally after he swallowed the endoscopy capsule this morning, but denies feeling that it is stuck. Complaining about smell of the equipment around his neck; "smells like a locker room."  Denies chest pains, palpitations, SOB, dyspnea, nausea or vomiting. He has not had a BM today and denies seeing any blood per rectum today.     Objective:     Blood pressure 145/64, pulse 94, temperature 98.2 ??F (36.8 ??C), resp. rate 20, height 5\' 6"  (1.676 m), weight 175 lb 4.8 oz (79.516 kg), SpO2 97.00%.    Physical Exam:  General: cooperative, no distress  Psych: irritated, unhappy  Neuro: alert, oriented x 3  Heart: regular rhythm, mild tachycardia, no murmurs appreciated  Lungs: clear to auscultation bilaterally  Abdomen: normal bowel sounds, soft, non-tender, no distension.     Data Review:  Recent Results (from the past 24 hour(s))   METABOLIC PANEL, BASIC    Collection Time    11/09/09 10:50 AM   Component Value Range   ??? Sodium 129 (*) 136 - 145 (MMOL/L)   ??? Potassium 3.5  3.5 - 5.1 (MMOL/L)   ??? Chloride 93 (*) 97 - 108 (MMOL/L)   ??? CO2 28  21 - 32 (MMOL/L)   ??? Anion gap 8  5 - 15 (mmol/L)   ??? Glucose 430 (*) 65 - 100 (MG/DL)   ??? BUN 37 (*) 6 - 20 (MG/DL)   ??? Creatinine 2.0 (*) 0.6 - 1.3 (MG/DL)   ??? BUN/Creatinine ratio 19  12 - 20 ( )   ??? GFR est AA 43 (*) >60 (ml/min/1.28m2)   ??? GFR est non-AA 35 (*) >60 (ml/min/1.18m2)   ??? Calcium 7.9 (*) 8.5 - 10.1 (MG/DL)   CBC W/O DIFF    Collection Time    11/09/09 10:50 AM   Component Value Range   ??? WBC 6.5  4.1 - 11.1 (K/uL)   ??? RBC 3.10 (*) 4.10 - 5.70 (M/uL)   ??? HGB 9.1 (*) 12.1 - 17.0 (g/dL)   ??? HCT 26.8 (*) 36.6 - 50.3 (%)   ??? MCV 86.5  80.0 - 99.0 (FL)   ??? MCH 29.4  26.0 - 34.0 (PG)   ??? MCHC 34.0  30.0 - 36.5 (g/dL)   ??? RDW 18.1 (*) 11.5 - 14.5 (%)   ??? PLATELET 150  150 - 400 (K/uL)   GLUCOSE, POC    Collection Time     11/09/09 11:17 AM   Component Value Range   ??? POC GLUCOSE 305 (*) 75 - 110 (mg/dL)   GLUCOSE, POC    Collection Time    11/09/09  4:18 PM   Component Value Range   ??? POC GLUCOSE 517 (*) 75 - 110 (mg/dL)   GLUCOSE, POC    Collection Time    11/09/09  9:08 PM   Component Value Range   ??? POC GLUCOSE 317 (*) 75 - 110 (mg/dL)   GLUCOSE, POC    Collection Time    11/10/09  4:32 AM   Component Value Range   ??? POC GLUCOSE 57 (*) 75 - 110 (mg/dL)   GLUCOSE, POC    Collection Time    11/10/09  4:59 AM   Component Value Range   ??? POC GLUCOSE 83  75 - 110 (mg/dL)   METABOLIC PANEL, BASIC  Collection Time    11/10/09  6:38 AM   Component Value Range   ??? Sodium 138  136 - 145 (MMOL/L)   ??? Potassium 3.9  3.5 - 5.1 (MMOL/L)   ??? Chloride 101  97 - 108 (MMOL/L)   ??? CO2 27  21 - 32 (MMOL/L)   ??? Anion gap 10  5 - 15 (mmol/L)   ??? Glucose 111 (*) 65 - 100 (MG/DL)   ??? BUN 33 (*) 6 - 20 (MG/DL)   ??? Creatinine 1.6 (*) 0.6 - 1.3 (MG/DL)   ??? BUN/Creatinine ratio 21 (*) 12 - 20 ( )   ??? GFR est AA 55 (*) >60 (ml/min/1.67m2)   ??? GFR est non-AA 46 (*) >60 (ml/min/1.29m2)   ??? Calcium 8.6  8.5 - 10.1 (MG/DL)   CBC W/O DIFF    Collection Time    11/10/09  6:38 AM   Component Value Range   ??? WBC 6.7  4.1 - 11.1 (K/uL)   ??? RBC 3.27 (*) 4.10 - 5.70 (M/uL)   ??? HGB 9.5 (*) 12.1 - 17.0 (g/dL)   ??? HCT 28.2 (*) 36.6 - 50.3 (%)   ??? MCV 86.2  80.0 - 99.0 (FL)   ??? MCH 29.1  26.0 - 34.0 (PG)   ??? MCHC 33.7  30.0 - 36.5 (g/dL)   ??? RDW 17.9 (*) 11.5 - 14.5 (%)   ??? PLATELET 167  150 - 400 (K/uL)   GLUCOSE, POC    Collection Time    11/10/09  7:35 AM   Component Value Range   ??? POC GLUCOSE 172 (*) 75 - 110 (mg/dL)       Procedure Review:    EGD 11/09/09:  Findings:   Esophagus: normal   Stomach: Minimal gastritis with few coffee ground specs seen.   Duodenum / jejunum: Minimal duodenitis, no ulcers seen, better than last exam    Assessment / Plan:      Acute blood loss anemia - see EGD results above     - Hgb stable at 9.5, s/p transfusion 2 days ago      - Capsule endoscopy in progress now - awaiting results    The above will be discussed with Dr. Monika Salk. Please await further plan from him.

## 2009-11-10 NOTE — Progress Notes (Signed)
Cardiopulmonary Rehab:  Follow up to previous patient visit.  Patient states, "I don't feel well and asked rehab nurse to come back."  Patient did not want any nursing assistance.  Will continue to follow.

## 2009-11-10 NOTE — Progress Notes (Signed)
Charge nurse informed and spoke with patient as well. Pt agreed to take an additional glucose tablet. Will recheck in 15 minutes after treatment given. Pt refused 3rd tablet, and refusing D50 IV at this time.

## 2009-11-10 NOTE — Progress Notes (Signed)
[facility] Progress Note    Assessment:     1.FAO:ZHYQMVHQION:GEX to GI bleed :better  2.CKD 3:Baseline cr around 1.5 to 1.8 mg% ,cr at baseline  3.Anemia:GI bleed +/- Anemia of CKD   4.CAD   5.HTN   6.DM   7.Hyponatremia:better with volume    Plan:     1.Follow labs over weekend  2.We will apply for epo for out pt    Subjective:     HPI: Patient seen    Current facility-administered medications   Medication Dose Route Frequency   ??? dextrose 5% and 0.9% NaCl infusion  50 mL/hr IntraVENous CONTINUOUS   ??? dextrose 5% infusion  50 mL/hr IntraVENous CONTINUOUS   ??? epoetin alfa (EPOGEN;PROCRIT) injection 20,000 Units  20,000 Units SubCUTAneous Q7D   ??? aspirin chewable tablet 81 mg  81 mg Oral DAILY   ??? levothyroxine (SYNTHROID) tablet 150 mcg  150 mcg Oral ACB   ??? nitroglycerin (NITROSTAT) tablet 0.4 mg  0.4 mg SubLINGual Q5MIN PRN   ??? terazosin (HYTRIN) capsule 10 mg  10 mg Oral QHS   ??? rosuvastatin (CRESTOR) tablet 20 mg  20 mg Oral DAILY   ??? misoprostol (CYTOTEC) tablet 200 mcg  200 mcg Oral BID   ??? prasugrel (EFFIENT) tablet 10 mg  10 mg Oral DAILY   ??? bumetanide (BUMEX) tablet 1 mg  1 mg Oral DAILY   ??? pantoprazole (PROTONIX) tablet 40 mg  40 mg Oral ACB   ??? glucagon (GLUCAGEN) injection 1 mg  1 mg IntraMUSCular PRN   ??? dextrose (D50W) injection Syrg 25 g  25 g IntraVENous PRN   ??? glucose chewable tablet 12 g  3 Tab Oral PRN   ??? insulin aspart (NOVOLOG)   SubCUTAneous AC&HS   ??? acetaminophen (TYLENOL) tablet 650 mg  650 mg Oral Q6H PRN          Review of Systems  A comprehensive review of systems was negative except for that written in the HPI.    Objective:     Vitals:  Blood pressure 145/64, pulse 94, temperature 98.2 ??F (36.8 ??C), resp. rate 20, height 5\' 6"  (1.676 m), weight 175 lb 4.8 oz (79.516 kg), SpO2 97.00%.  Temp (24hrs), Avg:98.4 ??F (36.9 ??C), Min:98.2 ??F (36.8 ??C), Max:98.5 ??F (36.9 ??C)      Intake and Output:     In: 1660 [P.O.:600; I.V.:400]  Out: 2850 [Urine:2850]     Physical Exam:                Patient is not currently intubated    GEN: NAD   RS: CTA, negative for crackles   CVS: S1S2  ABD: Soft, Non Tender  GU:  No Foley   EXT: trace  Edema  SKIN: No Rash   NEURO: Grossly focal      ECG: unchanged from previous tracings     Data Review        No results found for this basename: ITNL in the last 72 hours   Recent Labs   Basename 11/08/09 1510   ??? CPK --   ??? CKMB --   ??? TROIQ 0.04       Recent Labs   Basename 11/10/09 0638 11/09/09 1050 11/08/09 1510   ??? NA 138 129* 135*   ??? K 3.9 3.5 4.5   ??? CL 101 93* 100   ??? CO2 27 28 28    ??? BUN 33* 37* 48*   ??? CREA 1.6* 2.0* 2.6*   ???  GLU 111* 430* 272*   ??? PHOS -- -- --   ??? MG -- -- --   ??? ALB -- -- 3.3*   ??? WBC 6.7 6.5 6.0   ??? HGB 9.5* 9.1* 6.7*   ??? HCT 28.2* 26.8* 19.9*   ??? PLT 167 150 179         No results found for this basename: INR:3,PTP:3,APTT:3, in the last 72 hours  Needs: urine analysis, urine sodium, protein and creatinine  Lab Results   Component Value Date/Time    Creatinine,urine random 33.7 05/31/09 07:20 PM           Discussed with:    Patient

## 2009-11-10 NOTE — Progress Notes (Signed)
Bedside and Verbal shift change report given to Fabian Sharp RN (oncoming nurse) by Hassan Rowan RN (offgoing nurse).  Report given with SBAR, Kardex, Procedure Summary, MAR and Recent Results.

## 2009-11-10 NOTE — Other (Signed)
Capsule data recording device removed.  Discharge instructs reviewed with patient.  Patient denies any complaints at this time in regards to capsule procedure.

## 2009-11-10 NOTE — Progress Notes (Signed)
Agree with above, can discharge home over the weekend from GI standpoint. Capsule endoscopy film will be received early next and will review then.

## 2009-11-10 NOTE — Progress Notes (Signed)
Glucose log reviewed. Hypoglycemia 2 nights in a row on current Lantus dose.     Insulin orders adjusted in the chart so that we will decrease Lantus to 10 units daily. Will modify sliding scale as well to avoid overcorrection.     Will follow

## 2009-11-10 NOTE — Progress Notes (Signed)
DTC Progress Note    Recommendations/ Comments: If appropriate, please consider resuming small dose of Lantus. He may also benefit from a  small dose of Novolog AC given immediately after a meal if he has eaten. Use of the high sensitivity correction scale would correct his BG and may be less likely to drop his BG too low.     Chart reviewed on Travis [name].    Patient is a 69 y.o. male with DM. He takes Lantus 30 units at home and Novolog scale.    A1c:   Lab Results   Component Value Date/Time    Hemoglobin A1C 8.7 06/22/09 04:55 AM         POC Glucose last 24hrs:   Lab Results   Component Value Date/Time    POC GLUCOSE 438 11/10/09 12:39 PM    POC GLUCOSE 397 11/10/09 12:14 PM    POC GLUCOSE 172 11/10/09 07:35 AM    POC GLUCOSE 83 11/10/09 04:59 AM    POC GLUCOSE 57 11/10/09 04:32 AM    POC GLUCOSE 317 11/09/09 09:08 PM    POC GLUCOSE 517 11/09/09 04:18 PM    POC GLUCOSE 305 11/09/09 11:17 AM           Lab Results   Component Value Date/Time    Creatinine 1.6 11/10/09 06:38 AM         PO intake: He was NPO last night for GI testing this morning. Even without Lantus his BG dropped to 53 this AM. Note that he did receive correct Novolog of 10 units for high BG the previous evening.     Current hospital DM medications: Lantus was initially ordered on 11/08/09. However, the pt stated he had already taken his Lantus for that day and so declined the dose. The Lantus was d/c'd on 6/16. He has not received any Lantus while in the hospital. He is receiving Novolog scale.     His BG's have varied widely as seen in the above chart. He was hospitalized recently and his BG's varied widely during that time as well. Historically, he has a big response to insulin: if he does not receive it, his BG's are very high. If he is given insulin, his BG's drop low. This unpredictable  variability is impacted by many factors included his renal status, hydration, ability to eat in general, and tests requiring him to be  NPO.      He is being followed by Travis Gash, MD from [facility]. His wide swings in BG support consideration of beginning  to resume his insulin cautiously - consider starting with very low doses of both Lantus and Novolog sucn as Lantus 10 units each day and Novolog 2-4 units at meals, and use the high sensitivity correction scale.     Will continue to follow as needed.    Thank you.    Elenor Quinones RN, CDE

## 2009-11-10 NOTE — Progress Notes (Signed)
Spot checked Blood sugar, 57. Pt NPO, will give glucose tablets and recheck in 15 minutes

## 2009-11-10 NOTE — Progress Notes (Signed)
Rechecked patient, Blood Sugar 83 at this time. Will continue to monitor patient.

## 2009-11-10 NOTE — Progress Notes (Signed)
Tharon Aquas, MD. [facility]  [address]  [place], [place] [address]  Ph [phone];   Fax [phone]      Patient: Travis [name]  DOB: 1940/07/19      PROGRESS NOTE  S:  Was nauseated earlier in day with capsule endoscopy; doing better now  O. BP 143/61   Pulse 109   Temp 98.8 ??F (37.1 ??C)   Resp 20   Ht 5\' 6"  (1.676 m)   Wt 175 lb 4.8 oz (79.516 kg)   BMI 28.29 kg/m2   SpO2 98%   Patient appears generally well, mood and affect are appropriate and pleasant.   HEENT: Normocephalic, atraumatic.   Neck Exam: Supple, Left carotid bruit.   Lung Exam: course sounds at bases   Cardiac Exam: Regular rate and rhythm with 2/6 systolic murmur.   Abdomen: Soft, non-tender, normal bowel sounds. No bruits or masses.   Extremities: mild lower extremity edema   Vascular: difficult to palpate dorsalis pedis pulses bilaterally.     Recent Results (from the past 24 hour(s))   GLUCOSE, POC    Collection Time    11/09/09  4:18 PM   Component Value Range   ??? POC GLUCOSE 517 (*) 75 - 110 (mg/dL)   GLUCOSE, POC    Collection Time    11/09/09  9:08 PM   Component Value Range   ??? POC GLUCOSE 317 (*) 75 - 110 (mg/dL)   GLUCOSE, POC    Collection Time    11/10/09  4:32 AM   Component Value Range   ??? POC GLUCOSE 57 (*) 75 - 110 (mg/dL)   GLUCOSE, POC    Collection Time    11/10/09  4:59 AM   Component Value Range   ??? POC GLUCOSE 83  75 - 110 (mg/dL)   METABOLIC PANEL, BASIC    Collection Time    11/10/09  6:38 AM   Component Value Range   ??? Sodium 138  136 - 145 (MMOL/L)   ??? Potassium 3.9  3.5 - 5.1 (MMOL/L)   ??? Chloride 101  97 - 108 (MMOL/L)   ??? CO2 27  21 - 32 (MMOL/L)   ??? Anion gap 10  5 - 15 (mmol/L)   ??? Glucose 111 (*) 65 - 100 (MG/DL)   ??? BUN 33 (*) 6 - 20 (MG/DL)   ??? Creatinine 1.6 (*) 0.6 - 1.3 (MG/DL)   ??? BUN/Creatinine ratio 21 (*) 12 - 20 ( )   ??? GFR est AA 55 (*) >60 (ml/min/1.46m2)   ??? GFR est non-AA 46 (*) >60 (ml/min/1.68m2)   ??? Calcium 8.6  8.5 - 10.1 (MG/DL)   CBC W/O DIFF    Collection Time     11/10/09  6:38 AM   Component Value Range   ??? WBC 6.7  4.1 - 11.1 (K/uL)   ??? RBC 3.27 (*) 4.10 - 5.70 (M/uL)   ??? HGB 9.5 (*) 12.1 - 17.0 (g/dL)   ??? HCT 28.2 (*) 36.6 - 50.3 (%)   ??? MCV 86.2  80.0 - 99.0 (FL)   ??? MCH 29.1  26.0 - 34.0 (PG)   ??? MCHC 33.7  30.0 - 36.5 (g/dL)   ??? RDW 17.9 (*) 11.5 - 14.5 (%)   ??? PLATELET 167  150 - 400 (K/uL)   GLUCOSE, POC    Collection Time    11/10/09  7:35 AM   Component Value Range   ??? POC GLUCOSE 172 (*)  75 - 110 (mg/dL)   GLUCOSE, POC    Collection Time    11/10/09 12:14 PM   Component Value Range   ??? POC GLUCOSE 397 (*) 75 - 110 (mg/dL)   GLUCOSE, POC    Collection Time    11/10/09 12:39 PM   Component Value Range   ??? POC GLUCOSE 438 (*) 75 - 110 (mg/dL)       Current facility-administered medications   Medication Dose Route Frequency   ??? ondansetron (ZOFRAN) 4 mg in 0.9% sodium chloride 50 mL IVPB  4 mg IntraVENous Q6H PRN   ??? insulin aspart (NOVOLOG)   SubCUTAneous AC&HS   ??? insulin glargine (LANTUS) injection 10 Units  10 Units SubCUTAneous DAILY   ??? epoetin alfa (EPOGEN;PROCRIT) injection 20,000 Units  20,000 Units SubCUTAneous Q7D   ??? aspirin chewable tablet 81 mg  81 mg Oral DAILY   ??? levothyroxine (SYNTHROID) tablet 150 mcg  150 mcg Oral ACB   ??? nitroglycerin (NITROSTAT) tablet 0.4 mg  0.4 mg SubLINGual Q5MIN PRN   ??? terazosin (HYTRIN) capsule 10 mg  10 mg Oral QHS   ??? rosuvastatin (CRESTOR) tablet 20 mg  20 mg Oral DAILY   ??? misoprostol (CYTOTEC) tablet 200 mcg  200 mcg Oral BID   ??? prasugrel (EFFIENT) tablet 10 mg  10 mg Oral DAILY   ??? bumetanide (BUMEX) tablet 1 mg  1 mg Oral DAILY   ??? pantoprazole (PROTONIX) tablet 40 mg  40 mg Oral ACB   ??? glucagon (GLUCAGEN) injection 1 mg  1 mg IntraMUSCular PRN   ??? dextrose (D50W) injection Syrg 25 g  25 g IntraVENous PRN   ??? glucose chewable tablet 12 g  3 Tab Oral PRN   ??? acetaminophen (TYLENOL) tablet 650 mg  650 mg Oral Q6H PRN   ??? DISCONTD: insulin aspart (NOVOLOG)   SubCUTAneous AC&HS       Assessment and Plan:   1) Anemia    - hgb stable today  - undergoing capsule endoscopy  - due to recent multiple drug eluting stent placement, we need to continue with dual antiplatelet therapy if at all possible (will have high risk of acute stent thrombosis if antiplatelets stopped)  - Epo per renal     2) CAD   - Had chest pain prior to admission; this was likely due to the fact he was significantly anemic; He has had no chest pain after he was transfused.    3) ARF - improving with volume repletion/transfusion    4) Diabetes - appreciate Endocrine help    5) Dispo - will plan to DC home this weekend if OK with GI and Hgb remains stable    Tharon Aquas, MD, [facility]

## 2009-11-10 NOTE — Other (Signed)
Patient alert and oriented and denies any pain.  Instructions given regarding procedure with patient acknowledging understanding.  Patient swallowed capsule without difficulty.  Instructed patient and Clara, RN regarding npo status until 11a.m. And then light lunch at 1300 with medications.  Instructions written on white board in patient's room with verbalized understanding from both patient and rn.  Patient denies any pain or discomfort post capsule swallowing.

## 2009-11-11 LAB — METABOLIC PANEL, BASIC
Anion gap: 9 mmol/L (ref 5–15)
BUN/Creatinine ratio: 18 (ref 12–20)
BUN: 31 MG/DL — ABNORMAL HIGH (ref 6–20)
CO2: 26 MMOL/L (ref 21–32)
Calcium: 8.2 MG/DL — ABNORMAL LOW (ref 8.5–10.1)
Chloride: 98 MMOL/L (ref 97–108)
Creatinine: 1.7 MG/DL — ABNORMAL HIGH (ref 0.6–1.3)
GFR est AA: 52 mL/min/{1.73_m2} — ABNORMAL LOW (ref >60)
GFR est non-AA: 43 mL/min/{1.73_m2} — ABNORMAL LOW (ref >60)
Glucose: 375 MG/DL — ABNORMAL HIGH (ref 65–100)
Potassium: 4.1 MMOL/L (ref 3.5–5.1)
Sodium: 133 MMOL/L — ABNORMAL LOW (ref 136–145)

## 2009-11-11 LAB — GLUCOSE, POC
Glucose (POC): 320 mg/dL — ABNORMAL HIGH (ref 75–110)
Glucose (POC): 561 mg/dL — ABNORMAL HIGH (ref 75–110)
Glucose (POC): 597 mg/dL — ABNORMAL HIGH (ref 75–110)
Glucose (POC): 453 mg/dL — ABNORMAL HIGH (ref 75–110)
Glucose (POC): 203 mg/dL — ABNORMAL HIGH (ref 75–110)

## 2009-11-11 LAB — CBC W/O DIFF
HCT: 26.5 % — ABNORMAL LOW (ref 36.6–50.3)
HGB: 8.9 g/dL — ABNORMAL LOW (ref 12.1–17.0)
MCH: 29.3 PG (ref 26.0–34.0)
MCHC: 33.6 g/dL (ref 30.0–36.5)
MCV: 87.2 FL (ref 80.0–99.0)
PLATELET: 156 10*3/uL (ref 150–400)
RBC: 3.04 M/uL — ABNORMAL LOW (ref 4.10–5.70)
RDW: 17.7 % — ABNORMAL HIGH (ref 11.5–14.5)
WBC: 5.9 10*3/uL (ref 4.1–11.1)

## 2009-11-11 MED ORDER — METOPROLOL TARTRATE 25 MG TAB
25 mg | Freq: Two times a day (BID) | ORAL | Status: DC
Start: 2009-11-11 — End: 2009-11-11
  Administered 2009-11-11 (×2): via ORAL

## 2009-11-11 MED FILL — MISOPROSTOL 200 MCG TAB: 200 mcg | ORAL | Qty: 1

## 2009-11-11 MED FILL — INSULIN GLARGINE 100 UNIT/ML INJECTION: 100 unit/mL | SUBCUTANEOUS | Qty: 0.1

## 2009-11-11 MED FILL — METOPROLOL TARTRATE 25 MG TAB: 25 mg | ORAL | Qty: 1

## 2009-11-11 MED FILL — INSULIN ASPART 100 UNIT/ML INJECTION: 100 unit/mL | SUBCUTANEOUS | Qty: 1

## 2009-11-11 MED FILL — PANTOPRAZOLE 40 MG TAB, DELAYED RELEASE: 40 mg | ORAL | Qty: 1

## 2009-11-11 MED FILL — EFFIENT 10 MG TABLET: 10 mg | ORAL | Qty: 1

## 2009-11-11 MED FILL — LEVOTHYROXINE 150 MCG TAB: 150 mcg | ORAL | Qty: 1

## 2009-11-11 MED FILL — TERAZOSIN 5 MG CAP: 5 mg | ORAL | Qty: 2

## 2009-11-11 MED FILL — ASPIRIN 81 MG CHEWABLE TAB: 81 mg | ORAL | Qty: 1

## 2009-11-11 MED FILL — BUMETANIDE 1 MG TAB: 1 mg | ORAL | Qty: 1

## 2009-11-11 MED FILL — CRESTOR 10 MG TABLET: 10 mg | ORAL | Qty: 2

## 2009-11-11 NOTE — Progress Notes (Signed)
BP rechecked. Lopressor given at 1700s, pt BP elevated now at 168/76, HR 88. Paged Dr. Garald Balding office

## 2009-11-11 NOTE — Progress Notes (Signed)
Bedside and Verbal shift change report given to Clara RN (oncoming nurse) by Tracey Moore RN (offgoing nurse).  Report given with SBAR, Kardex, Intake/Output, MAR and Recent Results.

## 2009-11-11 NOTE — Progress Notes (Signed)
Bedside and Verbal shift change report given to Fabian Sharp  RN (oncoming nurse) by Hassan Rowan RN (offgoing nurse). Report given with SBAR, Kardex, Procedure Summary, Intake/Output, MAR and Recent Results.

## 2009-11-11 NOTE — Progress Notes (Signed)
Cardiopulmonary Rehab:  Follow up to previous patient teaching.  Patient sleeping at this time.  Will follow at a more appropriate time.

## 2009-11-11 NOTE — Progress Notes (Signed)
68 yo man with diffuse atherosclerosis including known CAD since 1999.    Patient with multiple caths /PCI; most recently PCI with DES 10/16/09 now admitted with GI bleed.    Upper endoscopy reveals duodenitis, GI work-up in progress under direction of Dr. Monika Salk.  HCT/Hb  26.5 /8.9 down from 28.3/9.5 yesterday.    Glucose elevated at 375 this am.     K+ 4.1   BUN  31   Creat 1.7    No Known Allergies    Current facility-administered medications   Medication Dose Route Frequency   ??? insulin as part (NOVOLOG)   SubCUTAneous QID WITH MEALS   ??? insulin glargine (LANTUS) injection 10 Units  10 Units SubCUTAneous DAILY   ??? insulin aspart (NOVOLOG) injection 4 Units  4 Units SubCUTAneous TID WITH MEALS   ??? epoetin alfa (EPOGEN;PROCRIT) injection 20,000 Units  20,000 Units SubCUTAneous Q7D   ??? aspirin chewable tablet 81 mg  81 mg Oral DAILY   ??? levothyroxine (SYNTHROID) tablet 150 mcg  150 mcg Oral ACB   ??? nitroglycerin (NITROSTAT) tablet 0.4 mg  0.4 mg SubLINGual Q5MIN PRN   ??? terazosin (HYTRIN) capsule 10 mg  10 mg Oral QHS   ??? rosuvastatin (CRESTOR) tablet 20 mg  20 mg Oral DAILY   ??? misoprostol (CYTOTEC) tablet 200 mcg  200 mcg Oral BID   ??? prasugrel (EFFIENT) tablet 10 mg  10 mg Oral DAILY   ??? bumetanide (BUMEX) tablet 1 mg  1 mg Oral DAILY   ??? pantoprazole (PROTONIX) tablet 40 mg  40 mg Oral ACB       BP 160/69   Pulse 101   Temp 98.5 ??F (36.9 ??C)   Resp 20   Ht 5\' 6"  (1.676 m)   Wt 176 lb 12.8 oz (80.196 kg)   BMI 28.54 kg/m2   SpO2 98%  Neck:  No JVD.    Chest:  Clear to auscultation.  CV:  Heart rate is regular with PMI at [facility], no appreciable murmur or gallop.   [place]ake site right infraclavicular space unremarkable.  Right IJ noted, no erythema.  Abd:  No tenderness, soft.  Ext:   No edema, decreased pedal pulses.  Tele:   Sinus tachycardia with ventricular pacing 100-110.  Normal DDD function.      I note patient's metoprolol has been discontinued.  I can find no reason why, therefore in light of recent Mi and known CAD in setting of anemia I will resume lopressor at 25 mg BID and increase as tolerated.  Patient's drop on Hct/Hb along with uncontrolled Glucose warrant continued observation as inpatient.    Rolanda Lundborg Everardo Beals, MD    F.A.C.C.

## 2009-11-11 NOTE — Progress Notes (Signed)
Notified Dr.sally about the blood sugar 597.advised orders carried out

## 2009-11-11 NOTE — Progress Notes (Signed)
2115 Dr. Everardo Beals made aware of elevated BP, new orders received and implemented.

## 2009-11-12 LAB — METABOLIC PANEL, BASIC
Anion gap: 10 mmol/L (ref 5–15)
BUN/Creatinine ratio: 18 (ref 12–20)
BUN: 28 MG/DL — ABNORMAL HIGH (ref 6–20)
CO2: 28 MMOL/L (ref 21–32)
Calcium: 8.2 MG/DL — ABNORMAL LOW (ref 8.5–10.1)
Chloride: 97 MMOL/L (ref 97–108)
Creatinine: 1.6 MG/DL — ABNORMAL HIGH (ref 0.6–1.3)
GFR est AA: 55 mL/min/{1.73_m2} — ABNORMAL LOW (ref >60)
GFR est non-AA: 46 mL/min/{1.73_m2} — ABNORMAL LOW (ref >60)
Glucose: 380 MG/DL — ABNORMAL HIGH (ref 65–100)
Potassium: 4 MMOL/L (ref 3.5–5.1)
Sodium: 135 MMOL/L — ABNORMAL LOW (ref 136–145)

## 2009-11-12 LAB — CBC W/O DIFF
HCT: 28.2 % — ABNORMAL LOW (ref 36.6–50.3)
HGB: 9.5 g/dL — ABNORMAL LOW (ref 12.1–17.0)
MCH: 29.4 PG (ref 26.0–34.0)
MCHC: 33.7 g/dL (ref 30.0–36.5)
MCV: 87.3 FL (ref 80.0–99.0)
PLATELET: 174 10*3/uL (ref 150–400)
RBC: 3.23 M/uL — ABNORMAL LOW (ref 4.10–5.70)
RDW: 17.4 % — ABNORMAL HIGH (ref 11.5–14.5)
WBC: 6.3 10*3/uL (ref 4.1–11.1)

## 2009-11-12 LAB — GLUCOSE, RANDOM: Glucose: 560 MG/DL — ABNORMAL HIGH (ref 65–100)

## 2009-11-12 LAB — GLUCOSE, POC
Glucose (POC): 190 mg/dL — ABNORMAL HIGH (ref 75–110)
Glucose (POC): 525 mg/dL — ABNORMAL HIGH (ref 75–110)
Glucose (POC): 292 mg/dL — ABNORMAL HIGH (ref 75–110)

## 2009-11-12 MED ORDER — LISINOPRIL 20 MG TAB
20 mg | Freq: Every evening | ORAL | Status: DC
Start: 2009-11-12 — End: 2009-11-13
  Administered 2009-11-13: 02:00:00 via ORAL

## 2009-11-12 MED ORDER — INSULIN ASPART 100 UNIT/ML INJECTION
100 unit/mL | Freq: Once | SUBCUTANEOUS | Status: AC
Start: 2009-11-12 — End: 2009-11-12
  Administered 2009-11-12: 13:00:00 via SUBCUTANEOUS

## 2009-11-12 MED ORDER — METOPROLOL TARTRATE 25 MG TAB
25 mg | Freq: Once | ORAL | Status: AC
Start: 2009-11-12 — End: 2009-11-11
  Administered 2009-11-12: 02:00:00 via ORAL

## 2009-11-12 MED ORDER — INSULIN GLARGINE 100 UNIT/ML INJECTION
100 unit/mL | Freq: Every evening | SUBCUTANEOUS | Status: DC
Start: 2009-11-12 — End: 2009-11-13
  Administered 2009-11-13: 02:00:00 via SUBCUTANEOUS

## 2009-11-12 MED ORDER — LABETALOL 5 MG/ML IV SYRINGE
5 mg/mL | Freq: Once | INTRAVENOUS | Status: AC
Start: 2009-11-12 — End: 2009-11-12
  Administered 2009-11-12: 04:00:00 via INTRAVENOUS

## 2009-11-12 MED ORDER — METOPROLOL TARTRATE 50 MG TAB
50 mg | Freq: Two times a day (BID) | ORAL | Status: DC
Start: 2009-11-12 — End: 2009-11-13
  Administered 2009-11-12 – 2009-11-13 (×3): via ORAL

## 2009-11-12 MED FILL — PANTOPRAZOLE 40 MG TAB, DELAYED RELEASE: 40 mg | ORAL | Qty: 1

## 2009-11-12 MED FILL — BUMETANIDE 1 MG TAB: 1 mg | ORAL | Qty: 1

## 2009-11-12 MED FILL — INSULIN ASPART 100 UNIT/ML INJECTION: 100 unit/mL | SUBCUTANEOUS | Qty: 1

## 2009-11-12 MED FILL — MISOPROSTOL 200 MCG TAB: 200 mcg | ORAL | Qty: 1

## 2009-11-12 MED FILL — INSULIN ASPART 100 UNIT/ML INJECTION: 100 unit/mL | SUBCUTANEOUS | Qty: 4

## 2009-11-12 MED FILL — LEVOTHYROXINE 150 MCG TAB: 150 mcg | ORAL | Qty: 1

## 2009-11-12 MED FILL — INSULIN ASPART 100 UNIT/ML INJECTION: 100 unit/mL | SUBCUTANEOUS | Qty: 5

## 2009-11-12 MED FILL — ASPIRIN 81 MG CHEWABLE TAB: 81 mg | ORAL | Qty: 1

## 2009-11-12 MED FILL — TERAZOSIN 5 MG CAP: 5 mg | ORAL | Qty: 2

## 2009-11-12 MED FILL — METOPROLOL TARTRATE 25 MG TAB: 25 mg | ORAL | Qty: 1

## 2009-11-12 MED FILL — METOPROLOL TARTRATE 50 MG TAB: 50 mg | ORAL | Qty: 1

## 2009-11-12 MED FILL — CRESTOR 10 MG TABLET: 10 mg | ORAL | Qty: 2

## 2009-11-12 MED FILL — LABETALOL 5 MG/ML IV SYRINGE: 5 mg/mL | INTRAVENOUS | Qty: 4

## 2009-11-12 MED FILL — EFFIENT 10 MG TABLET: 10 mg | ORAL | Qty: 1

## 2009-11-12 MED FILL — INSULIN GLARGINE 100 UNIT/ML INJECTION: 100 unit/mL | SUBCUTANEOUS | Qty: 0.2

## 2009-11-12 NOTE — Progress Notes (Signed)
69 yo man with known CAD s/p PCI with DES 1/11 and non ST MI 5/31 now with GI bleed  Tele:  Sinus rhythm with paced ventricular rhythm.  Hyperglycemia remains a problem as does BP  HCT/Hb    28.2/9.5   stable   Glucose 380  BUN 28  Creatinine 1.6    No Known Allergies    Current facility-administered medications   Medication Dose Route Frequency   ??? metoprolol (LOPRESSOR) tablet 50 mg  50 mg Oral BID   ??? insulin aspart (NOVOLOG)   SubCUTAneous QID WITH MEALS   ??? insulin glargine (LANTUS) injection 10 Units  10 Units SubCUTAneous DAILY   ??? insulin aspart (NOVOLOG) injection 4 Units  4 Units SubCUTAneous TID WITH MEALS   ??? epoetin alfa (EPOGEN;PROCRIT) injection 20,000 Units  20,000 Units SubCUTAneous Q7D   ??? aspirin chewable tablet 81 mg  81 mg Oral DAILY   ??? levothyroxine (SYNTHROID) tablet 150 mcg  150 mcg Oral ACB   ??? nitroglycerin (NITROSTAT) tablet 0.4 mg  0.4 mg SubLINGual Q5MIN PRN   ??? terazosin (HYTRIN) capsule 10 mg  10 mg Oral QHS   ??? rosuvastatin (CRESTOR) tablet 20 mg  20 mg Oral DAILY   ??? misoprostol (CYTOTEC) tablet 200 mcg  200 mcg Oral BID   ??? prasugrel (EFFIENT) tablet 10 mg  10 mg Oral DAILY   ??? bumetanide (BUMEX) tablet 1 mg  1 mg Oral DAILY   ??? pantoprazole (PROTONIX) tablet 40 mg  40 mg Oral ACB     Social History   ??? Smoking status: Former Smoker -- 20 years     Quit date: 11/09/1978   ??? Alcohol Use: No     Past Medical History   Diagnosis Date   ??? HTN (hypertension)    ??? PVD (peripheral vascular disease)    ??? DM (diabetes mellitus)    ??? Hyperlipidemia    ??? Sleep apnea      on CPAP   ??? COPD (chronic obstructive pulmonary disease)      mild COPD, recurrent pneumonia, chronic bronchitis (followed by Dr. Leroy Libman)   ??? CKD (chronic kidney disease)      Cr 1.7 in 10/09   ??? Anemia    ??? Pulmonary hypertension      PASP 60 on echo 06/22/09   ??? Thromboembolus      leg    ??? Thyroid disease    ??? Pneumonia    ??? GERD (gastroesophageal reflux disease)    ??? Chronic kidney disease    ??? Chronic pain      back pain    ??? Cataract      BP 154/60   Pulse 80   Temp 97.9 ??F (36.6 ??C)   Resp 18   Ht 5\' 6"  (1.676 m)   Wt 178 lb 3.2 oz (80.831 kg)   BMI 28.76 kg/m2   SpO2 98%  Neck:  No JVD. Right IJ in place  Chest:  Clear to auscultation.  CV:  Heart rate is regular with nondisplaced PMI, no appreciable murmur or gallop.  Pacemaker right infraclavicular space is unremarkable.  Abd:  No tenderness, soft, obese.  Ext:   Mild edema, chronic venous stasis changes, surgical scar over right knee..    Impression:    CAD with preserved LVEF(~55%)                          HTN  GI bleed                           DM    Will need to ask Dr. Vear Clock to re-evaluate Insulin dosage as glucose > 350 again this am.  HCT/Hb seems stable. Will resume Lisinopril 20 mg q hs and would plan on discharge when glucose under control.  Patient can be moved to 5th floor off tele tomorrow if continued hospitalization needed.    Rolanda Lundborg Everardo Beals, MD    F.A.C.C.

## 2009-11-12 NOTE — Progress Notes (Signed)
Bedside and Verbal shift change report given to Tracey Moore RN (oncoming nurse) by Shakisa Tyler RN (offgoing nurse).  Report given with SBAR, Kardex, MAR and Recent Results.

## 2009-11-12 NOTE — Progress Notes (Signed)
BP high with automatic pressure. Rechecked by manual cuff and significantly lower. Will continue to monitor patient. Pt denies headache or any further complaints.

## 2009-11-12 NOTE — Progress Notes (Addendum)
Blood sugars reviewed.  Lantus decreased 2 days ago due to hypoglycemia and SSI adjusted.   AC BK AC LU AC DI  HS  6/18 597  453  203  190  6/19 525     Assessment:  69 yo male with type 2 diabetes.  Significant hypoglycemia a couple of days ago so insulin decreased.  Now with hyperglycemia, particularly fasting.  Will increase basal insulin.    Plan:  --increase lantus to 20 units at bedtime  --continue novolog with meals 4 units with sliding scale correction factor  --will follow    Addendum - At time of discharge, pt can go home on 20 units of lantus and increase by 5 units every 2-3 days if blood sugar above 150 in the morning.

## 2009-11-12 NOTE — Progress Notes (Signed)
2346 Dr. Everardo Beals made aware pt BP remains elevated by manual check. New orders received and implemented. Will continue to monitor. Will recheck BP in one hour.

## 2009-11-13 LAB — METABOLIC PANEL, BASIC
Anion gap: 8 mmol/L (ref 5–15)
BUN/Creatinine ratio: 18 (ref 12–20)
BUN: 28 MG/DL — ABNORMAL HIGH (ref 6–20)
CO2: 31 MMOL/L (ref 21–32)
Calcium: 8.6 MG/DL (ref 8.5–10.1)
Chloride: 98 MMOL/L (ref 97–108)
Creatinine: 1.6 MG/DL — ABNORMAL HIGH (ref 0.6–1.3)
GFR est AA: 55 mL/min/{1.73_m2} — ABNORMAL LOW (ref >60)
GFR est non-AA: 46 mL/min/{1.73_m2} — ABNORMAL LOW (ref >60)
Glucose: 159 MG/DL — ABNORMAL HIGH (ref 65–100)
Potassium: 3.8 MMOL/L (ref 3.5–5.1)
Sodium: 137 MMOL/L (ref 136–145)

## 2009-11-13 LAB — GLUCOSE, POC
Glucose (POC): 207 mg/dL — ABNORMAL HIGH (ref 75–110)
Glucose (POC): 307 mg/dL — ABNORMAL HIGH (ref 75–110)
Glucose (POC): >600 mg/dL — CR (ref 75–110)
Glucose (POC): >600 mg/dL — CR (ref 75–110)
Glucose (POC): 363 mg/dL — ABNORMAL HIGH (ref 75–110)
Glucose (POC): 234 mg/dL — ABNORMAL HIGH (ref 75–110)

## 2009-11-13 LAB — HEMOGLOBIN: HGB: 9.5 g/dL — ABNORMAL LOW (ref 12.1–17.0)

## 2009-11-13 LAB — HEMATOCRIT: HCT: 28.1 % — ABNORMAL LOW (ref 36.6–50.3)

## 2009-11-13 MED ORDER — METOPROLOL TARTRATE 50 MG TAB
50 mg | ORAL_TABLET | Freq: Two times a day (BID) | ORAL | Status: DC
Start: 2009-11-13 — End: 2011-07-23

## 2009-11-13 MED ORDER — INSULIN GLARGINE 100 UNIT/ML INJECTION
100 unit/mL | Freq: Every evening | SUBCUTANEOUS | Status: DC
Start: 2009-11-13 — End: 2010-11-14

## 2009-11-13 MED ORDER — INSULIN ASPART 100 UNIT/ML INJECTION
100 unit/mL | Freq: Four times a day (QID) | SUBCUTANEOUS | Status: DC
Start: 2009-11-13 — End: 2010-11-14

## 2009-11-13 MED ORDER — LISINOPRIL 20 MG TAB
20 mg | ORAL_TABLET | Freq: Every evening | ORAL | Status: DC
Start: 2009-11-13 — End: 2011-09-02

## 2009-11-13 MED FILL — INSULIN GLARGINE 100 UNIT/ML INJECTION: 100 unit/mL | SUBCUTANEOUS | Qty: 0.2

## 2009-11-13 MED FILL — INSULIN ASPART 100 UNIT/ML INJECTION: 100 unit/mL | SUBCUTANEOUS | Qty: 1

## 2009-11-13 MED FILL — METOPROLOL TARTRATE 50 MG TAB: 50 mg | ORAL | Qty: 1

## 2009-11-13 MED FILL — PANTOPRAZOLE 40 MG TAB, DELAYED RELEASE: 40 mg | ORAL | Qty: 1

## 2009-11-13 MED FILL — MISOPROSTOL 200 MCG TAB: 200 mcg | ORAL | Qty: 1

## 2009-11-13 MED FILL — INSULIN ASPART 100 UNIT/ML INJECTION: 100 unit/mL | SUBCUTANEOUS | Qty: 4

## 2009-11-13 MED FILL — LEVOTHYROXINE 150 MCG TAB: 150 mcg | ORAL | Qty: 1

## 2009-11-13 MED FILL — CRESTOR 10 MG TABLET: 10 mg | ORAL | Qty: 2

## 2009-11-13 MED FILL — TERAZOSIN 5 MG CAP: 5 mg | ORAL | Qty: 2

## 2009-11-13 MED FILL — ASPIRIN 81 MG CHEWABLE TAB: 81 mg | ORAL | Qty: 1

## 2009-11-13 MED FILL — BUMETANIDE 1 MG TAB: 1 mg | ORAL | Qty: 1

## 2009-11-13 MED FILL — EFFIENT 10 MG TABLET: 10 mg | ORAL | Qty: 1

## 2009-11-13 MED FILL — LISINOPRIL 20 MG TAB: 20 mg | ORAL | Qty: 1

## 2009-11-13 NOTE — Telephone Encounter (Signed)
They had a question about the insulin that Dr Welton Flakes e-scribed this AM. I read to her what was in the system since we do not routinely order this.

## 2009-11-13 NOTE — Progress Notes (Signed)
Cardiopulmonary Rehab:  Follow up to previous patient teaching prior to discharge.  Reviewed the importance of daily weight monitoring and adhering to a low sodium diet.  Printed education material given regarding GI bleeding per patient request.  Reviewed signs and symptoms of bleeding.  Patient verbalized understanding.

## 2009-11-13 NOTE — Progress Notes (Signed)
Cardiopulmonary Rehab:  Follow up to previous patient teaching.  Reviewed the importance of monitoring daily weights.  Patient denies having any further questions at this time.  Will continue to follow.

## 2009-11-13 NOTE — Progress Notes (Signed)
I have reviewed discharge instructions with the patient.  The patient verbalized understanding.  Kathryne S. Jenkins, RN

## 2009-11-13 NOTE — Discharge Summary (Signed)
Tharon Aquas, MD. [facility]  [address], [address]  [place], [place] [address]  Ph [phone];   Fax [phone]      Patient: Travis [name]  DOB: 10/18/1940        Admit Date: 11/08/09  DC Date: 11/13/09    DC Diagnosis  1) GI Bleed - capsule endoscopy results pending  2) ARF on CKD  3) HTN  4) Hyper and hypoglycemia  5) Chronic Anemia  6) CAD - unstable angina on admission    Hospital course  Travis [name] was admitted for anemia associated with angina (underlying CAD).  His Hgb was 6.7 on admission. He was transfused 2 units PRBCs and his Hgb has been stable since admission and his chest pain resolved. He underwent an EGD by Dr, Monika Salk showing minimal gastritis and duodenitis.  A capsule endoscopy was also done and the results of it are still pending.  A PPI was added ti his regimen.  The anti-platelets need to continue if at all possible given the placement of recent drug eluting stents.  He also had problems with hypo and hyperglycemia and was seen by Dr. Vear Clock of endocrine - his insulin dose was adjusted.  He had ARF on CKD on arrival which improved with hydration/transfusion. His BP was high over the weekend and lisinopril restarted at that time.  Travis [name] is discharged in stable condition and will be seeing me back in clinic in one week.          Medications prior to admission that will be resumed at discharge:   Medication Sig Dispense Refill   ??? bumetanide (BUMEX) 2 mg tablet Take 0.5 Tabs by mouth daily.  30 Tab  0   ??? prasugrel (EFFIENT) 10 mg tablet Take 1 Tab by mouth daily. FIRST DOSE TODAY SHOULD BE 60 MG  45 Tab  4   ??? misoprostol (CYTOTEC) 200 mcg tablet Take 1 Tab by mouth two (2) times a day.  60 Tab  3   ??? rosuvastatin (CRESTOR) 20 mg tablet Take 20 mg by mouth daily.       ??? terazosin (HYTRIN) 5 mg capsule Take 10 mg by mouth nightly.       ??? aspirin 81 mg tablet Take  by mouth daily.        ??? omega-3 fatty acids-vitamin e (FISH OIL) 1,000 mg Cap Take  by mouth two (2) times a day.       ??? levothyroxine (SYNTHROID) 150 mcg tablet Take  by mouth daily (before breakfast).       ??? nitroglycerin (NITROSTAT) 0.4 mg SL tablet by SubLINGual route every five (5) minutes as needed.       ??? ranitidine hcl (ZANTAC) 150 mg capsule Take 150 mg by mouth two (2) times a day.         New medications prescribed at discharge:   Medication Sig Dispense Refill   ??? metoprolol (LOPRESSOR) 50 mg tablet Take 1 Tab by mouth two (2) times a day.  60 Tab  6   ??? lisinopril (PRINIVIL, ZESTRIL) 20 mg tablet Take 1 Tab by mouth nightly.  30 Tab  6   ??? insulin glargine (LANTUS) 100 unit/mL injection 20 Units by SubCUTAneous route nightly.  1 Vial  6   ??? insulin aspart 100 unit/mL Soln 4 Units injection by SubCUTAneous route four (4) times daily (with meals).  100 mL  prn   ??? insulin aspart (NOVOLOG) 100 unit/mL injection 0.04 mL  by SubCUTAneous route three (3) times daily (before meals).  10 mL  0   ??? pantoprazole (PROTONIX) 40 mg tablet Take 1 Tab by mouth daily (before breakfast).  30 Tab  0   ??? DISCONTD: insulin glargine (LANTUS) 100 unit/mL injection 10 Units by SubCUTAneous route nightly.  1 Vial  0          Tharon Aquas, MD, [facility]

## 2009-11-13 NOTE — Progress Notes (Signed)
[facility] Progress Note    Assessment:     CKD stage 3, stable  HTN, reasonably controlled  Anemia of CKD    Plan:     Monitor serial labs  Procrit as outpatient after discharge  Outpatient follow up with Dr. Lucia Estelle    Subjective:     HPI: CKD is stable with creatinine of 1.6.   Current facility-administered medications   Medication Dose Route Frequency   ??? lisinopril (PRINIVIL, ZESTRIL) tablet 20 mg  20 mg Oral QHS   ??? insulin glargine (LANTUS) injection 20 Units  20 Units SubCUTAneous QHS   ??? metoprolol (LOPRESSOR) tablet 50 mg  50 mg Oral BID   ??? insulin aspart (NOVOLOG)   SubCUTAneous QID WITH MEALS   ??? insulin aspart (NOVOLOG) injection 4 Units  4 Units SubCUTAneous TID WITH MEALS   ??? epoetin alfa (EPOGEN;PROCRIT) injection 20,000 Units  20,000 Units SubCUTAneous Q7D   ??? aspirin chewable tablet 81 mg  81 mg Oral DAILY   ??? levothyroxine (SYNTHROID) tablet 150 mcg  150 mcg Oral ACB   ??? nitroglycerin (NITROSTAT) tablet 0.4 mg  0.4 mg SubLINGual Q5MIN PRN   ??? terazosin (HYTRIN) capsule 10 mg  10 mg Oral QHS   ??? rosuvastatin (CRESTOR) tablet 20 mg  20 mg Oral DAILY   ??? misoprostol (CYTOTEC) tablet 200 mcg  200 mcg Oral BID   ??? prasugrel (EFFIENT) tablet 10 mg  10 mg Oral DAILY   ??? bumetanide (BUMEX) tablet 1 mg  1 mg Oral DAILY   ??? pantoprazole (PROTONIX) tablet 40 mg  40 mg Oral ACB   ??? glucagon (GLUCAGEN) injection 1 mg  1 mg IntraMUSCular PRN   ??? dextrose (D50W) injection Syrg 25 g  25 g IntraVENous PRN   ??? glucose chewable tablet 12 g  3 Tab Oral PRN   ??? acetaminophen (TYLENOL) tablet 650 mg  650 mg Oral Q6H PRN          Review of Systems  Gastrointestinal: negative for nausea and vomiting    Objective:     Vitals:  Blood pressure 159/76, pulse 79, temperature 98.3 ??F (36.8 ??C), resp. rate 18, height 5\' 6"  (1.676 m), weight 177 lb 9.6 oz (80.559 kg), SpO2 95.00%.  Temp (24hrs), Avg:98.4 ??F (36.9 ??C), Min:98.1 ??F (36.7 ??C), Max:99.1 ??F (37.3 ??C)      Intake and Output:  In: 480 [P.O.:480]   Out: -   In: 1200 [P.O.:1200]  Out: 3075 [Urine:3075]    Physical Exam:                  GEN: NAD   RS: CTA, negative for crackles   CVS: S1S2  ABD: Soft, Non Tender  EXT: No Edema      Data Review        No results found for this basename: ITNL in the last 72 hours     No results found for this basename: CPK:3,CKMB:3,TROIQ:3, in the last 72 hours  Recent Labs   Basename 11/13/09 0400 11/12/09 1155 11/12/09 0353 11/11/09 0500   ??? NA 137 -- 135* 133*   ??? K 3.8 -- 4.0 4.1   ??? CL 98 -- 97 98   ??? CO2 31 -- 28 26   ??? BUN 28* -- 28* 31*   ??? CREA 1.6* -- 1.6* 1.7*   ??? GLU 159* 560* 380* --   ??? PHOS -- -- -- --   ??? MG -- -- -- --   ???  ALB -- -- -- --   ??? WBC -- -- 6.3 5.9   ??? HGB 9.5* -- 9.5* 8.9*   ??? HCT 28.1* -- 28.2* 26.5*   ??? PLT -- -- 174 156         No results found for this basename: INR:3,PTP:3,APTT:3, in the last 72 hours  Needs: urine analysis, urine sodium, protein and creatinine  Lab Results   Component Value Date/Time    Creatinine,urine random 33.7 05/31/09 07:20 PM           Discussed with:    Patient

## 2009-11-13 NOTE — Telephone Encounter (Signed)
Please call [facility] regarding a medication question.  [phone].  Thanks

## 2009-11-14 LAB — CBC W/O DIFF
HCT: 19.9 % — ABNORMAL LOW (ref 36.6–50.3)
HGB: 6.7 g/dL — CL (ref 12.1–17.0)
MCH: 29.4 PG (ref 26.0–34.0)
MCHC: 33.7 g/dL (ref 30.0–36.5)
MCV: 87.3 FL (ref 80.0–99.0)
PLATELET: 179 10*3/uL (ref 150–400)
RBC: 2.28 M/uL — ABNORMAL LOW (ref 4.10–5.70)
RDW: 17.3 % — ABNORMAL HIGH (ref 11.5–14.5)
WBC: 6 10*3/uL (ref 4.1–11.1)

## 2009-11-20 NOTE — Progress Notes (Signed)
Tharon Aquas, MD. [facility]  [address], [address]  [place], [place] [address]  Ph [phone];   Fax [phone]      Patient: Travis [name]  DOB: [birth year]          History of Present Illness:  He is here for follow-up.  He actually feels well.  No SOB or CP or lightheadedness or problems with leg swelling.  He admits he was having dark stools prior to last admission, but this is not the case any longer.  Weight up 7 lbs past week when compared to hospital weight.    Past Medical History   Diagnosis Date   ??? CAD (coronary artery disease)      see problem list   ??? HTN (hypertension)    ??? PVD (peripheral vascular disease)    ??? DM (diabetes mellitus)    ??? Hyperlipidemia    ??? Sleep apnea      on CPAP   ??? COPD (chronic obstructive pulmonary disease)      mild COPD, recurrent pneumonia, chronic bronchitis (followed by Dr. Leroy Libman)   ??? CKD (chronic kidney disease)      Cr 1.7 in 10/09   ??? CHF (congestive heart failure)    ??? Anemia    ??? Pulmonary hypertension      PASP 60 on echo 06/22/09   ??? Thromboembolus      leg    ??? Thyroid disease    ??? Pneumonia    ??? GERD (gastroesophageal reflux disease)    ??? Chronic pain      back pain   ??? Cataract    ??? GI bleed      blood transfusions in 5/11 and 6/11.  Dr. Monika Salk has followed.       Cardiac History:    Mr. Szymczak has known coronary artery disease dating back to May 1999 when he underwent cardiac catheterization prior to planned peripheral vascular surgery. This showed distal disease in the apical LAD and right coronary artery with a normal ejection fraction.Non q MI in 7/06.cath showed mod LAD abn RCA disease up to 50-60% and a small occluded LCX with normal LVEF. Had elevated troponin (?NSTEMI) during admission with pneumonia and ARF 05/2009 - proceeded to have a PCI (DES) to RCA. He returned for a NSTEMI (trop ~30) on 06/21/09 but cath showed stable findings. NSTEMI with another cath in 2/11 - the stent was patent and no new findings. Multiple NSTEMI's in 5/11 - proceeded on 10/16/09 to PCI of 85% ulcerated plaque mid RCA successfully stented with 2.75 Xience. CTO Cx successful PTCA and DES of proximal part with 2.25 Atom.   Prior Cardiac Eval:   X) Cath 2003 and 2006 - chronic LCX occlusion   1) stress test 02/09/07. The type of test was a dobutamine stress perfusion imaging. Cardiac stress testing was negative for chest pain and myocardial ischemia. Myocardial perfusion imaging showed reversible defect(s) involving the inferior wall. Gated EF (%): 55.   2) echocardiogram 01/19/07. Echocardiogram demonstrated normal LV wall motion and ejection fraction, LVH and left atrial enlargement.   3) Carotid Dopplers in July 2009 showed bilateral 50-79% stenosis.   4) Carotids 07/08/08 - 50-79% stenosis bilat   5) Echo 04/03/09 - TDS, moderate LVH, LVEF 65%; moderate LAE, RAE, mild AS (mean gradient 10), mild MR   6) Echo 1/11 - mild LVH, LAE, EF 55%, mild Pulm HTN, mild MR   7) Cath 06/05/09- DES to RCA   8)  cath 06/22/09 - LAD 50%, LCX 100%, mid RCA patent stent, distal RCA 40%   9) Echo 06/22/09 - mild LVH, EF 55-60%, mild LAE, mild MR/TR, PASP 60   10) Cath 07/18/09 - patent stent and stable findings; EF 60%    11) Cath 10/16/09 - 85% ulcerated plaque mid RCA successfully stented with 2.75 Xience. CTO Cx successful PTCA and DES of proximal part with 2.25 Atom      Current outpatient prescriptions   Medication Sig Dispense Refill   ??? metoprolol (LOPRESSOR) 50 mg tablet Take 1 Tab by mouth two (2) times a day.  60 Tab  6   ??? lisinopril (PRINIVIL, ZESTRIL) 20 mg tablet Take 1 Tab by mouth nightly.  30 Tab  6   ??? insulin glargine (LANTUS) 100 unit/mL injection 20 Units by SubCUTAneous route nightly.  1 Vial  6   ??? insulin aspart 100 unit/mL Soln 4 Units injection by SubCUTAneous route four (4) times daily (with meals).  100 mL  prn   ??? insulin aspart (NOVOLOG) 100 unit/mL injection 0.04 mL by SubCUTAneous route three (3) times daily (before meals).  10 mL  0   ??? bumetanide (BUMEX) 2 mg tablet Take 0.5 Tabs by mouth daily.  30 Tab  0   ??? prasugrel (EFFIENT) 10 mg tablet Take 1 Tab by mouth daily. FIRST DOSE TODAY SHOULD BE 60 MG  45 Tab  4   ??? misoprostol (CYTOTEC) 200 mcg tablet Take 1 Tab by mouth two (2) times a day.  60 Tab  3   ??? rosuvastatin (CRESTOR) 20 mg tablet Take 20 mg by mouth daily.       ??? terazosin (HYTRIN) 5 mg capsule Take 10 mg by mouth nightly.       ??? aspirin 81 mg tablet Take  by mouth daily.       ??? omega-3 fatty acids-vitamin e (FISH OIL) 1,000 mg Cap Take  by mouth two (2) times a day.       ??? levothyroxine (SYNTHROID) 150 mcg tablet Take  by mouth daily (before breakfast).       ??? nitroglycerin (NITROSTAT) 0.4 mg SL tablet by SubLINGual route every five (5) minutes as needed.       ??? ranitidine hcl (ZANTAC) 150 mg capsule Take 150 mg by mouth two (2) times a day.       ??? pantoprazole (PROTONIX) 40 mg tablet Take 1 Tab by mouth daily (before breakfast).  30 Tab  0     No Known Allergies    Review of Systems:   Constitutional: Negative for fever, chills, weight loss, malaise/fatigue and diaphoresis.   HEENT: Negative for nosebleeds, congestion, neck pain, tinnitus, and vision changes.    Respiratory: Negative for hemoptysis, sputum production, and wheezing.   Gastrointestinal: Negative for nausea, vomiting, abdominal pain, diarrhea, constipation, blood in stool and melena.   Genitourinary: Negative for dysuria, urgency, frequency and hematuria.   Musculoskeletal: Negative for myalgias.   Skin: Negative for rash and itching.   Heme: Does not bleed or bruise easily.   Neurological: Negative for speech change and focal weakness.     Physical Exam:   BP 140/60   Pulse 84   Ht 5\' 6"  (1.676 m)   Wt 185 lb (83.915 kg)   BMI 29.86 kg/m2   Patient appears generally well, mood and affect are appropriate and pleasant.   HEENT: Normocephalic, atraumatic.   Neck Exam: Supple, Left carotid bruit.   Lung Exam:  Clear to auscultation, even breath sounds.   Cardiac Exam: Regular rate and rhythm with 2/6 systolic murmur.   Abdomen: Soft, non-tender, normal bowel sounds. No bruits or masses.   Extremities: mild lower extremity edema   Vascular: difficult to palpate dorsalis pedis pulses bilaterally.       Assessment and Plan:  1) GI - will restart Protonix, which he wasn't taking.  Will need to follow-up with Dr. Monika Salk regarding capsule endoscopy results.  No longer has the dark stools he had prior to last admission.  2) CAD - no unstable symptoms; need to continue with Effient at least 5/12 if possible  3) HTN - BP acceptable given his prior problems with orthostasis/hypotension  4) Edema - stable; on daily Bumex; will keep dose as is since he is feeling well.  I've asked the daughter to watch weight and symptoms closely and that I have a low threshold to increase the Bumex.  5) CKD - sees renal  6) Chronic anemia - is to get monthly procrit shots  7) Labs - check CBC and BMP in a week.   8) DM - per endocrine  9) RTC one month      Tharon Aquas, MD, [facility]

## 2009-11-23 NOTE — Telephone Encounter (Signed)
Message copied by Einar Gip on Thu Nov 23, 2009  2:23 PM  ------       Message from: Laddie Aquas       Created: Tue Nov 21, 2009  9:52 PM       Regarding: endoscopy         Can you let him know that capsule endoscopy normal

## 2009-11-23 NOTE — Telephone Encounter (Signed)
L/m on dtr's answering machine.

## 2009-12-27 NOTE — Progress Notes (Signed)
Tharon Aquas, MD. [facility]  [address], [address]  [place], [place] [address]  Ph [phone];   Fax [phone]      Patient: Travis [name]  DOB: 11-24-1940    PCP - [facility] (Dr. Daryel November)    History of Present Illness:  Here for follow-up.  He is generally doing well.  His chronic LE edema is stable and he denies abdominal distension, but he feels he is gaining weight (from eating too much per daughter). He says he feels pretty good.  Breathing is good and not getting SOB.      Past Medical History   Diagnosis Date   ??? CAD (coronary artery disease)      see problem list   ??? HTN (hypertension)    ??? PVD (peripheral vascular disease)    ??? DM (diabetes mellitus)    ??? Hyperlipidemia    ??? Sleep apnea      on CPAP   ??? COPD (chronic obstructive pulmonary disease)      mild COPD, recurrent pneumonia, chronic bronchitis (followed by Dr. Leroy Libman)   ??? CKD (chronic kidney disease)      Cr 1.7 in 10/09   ??? CHF (congestive heart failure)    ??? Anemia    ??? Pulmonary hypertension      PASP 60 on echo 06/22/09   ??? Thromboembolus      leg    ??? Thyroid disease    ??? Pneumonia    ??? GERD (gastroesophageal reflux disease)    ??? Chronic pain      back pain   ??? Cataract    ??? GI bleed      blood transfusions in 5/11 and 6/11.  Dr. Monika Salk has followed.       Cardiac History:    [name] has known coronary artery disease dating back to May 1999 when he underwent cardiac catheterization prior to planned peripheral vascular surgery. This showed distal disease in the apical LAD and right coronary artery with a normal ejection fraction.Non q MI in 7/06.cath showed mod LAD abn RCA disease up to 50-60% and a small occluded LCX with normal LVEF. Had elevated troponin (?NSTEMI) during admission with pneumonia and ARF 05/2009 - proceeded to have a PCI (DES) to RCA. He returned for a NSTEMI (trop ~30) on 06/21/09 but cath showed stable findings. NSTEMI with another cath in 2/11 - the stent was patent and no new findings. Multiple NSTEMI's in 5/11 - proceeded on 10/16/09 to PCI of 85% ulcerated plaque mid RCA successfully stented with 2.75 Xience. CTO Cx successful PTCA and DES of proximal part with 2.25 Atom.   Prior Cardiac Eval:   X) Cath 2003 and 2006 - chronic LCX occlusion   1) stress test 02/09/07. The type of test was a dobutamine stress perfusion imaging. Cardiac stress testing was negative for chest pain and myocardial ischemia. Myocardial perfusion imaging showed reversible defect(s) involving the inferior wall. Gated EF (%): 55.   2) echocardiogram 01/19/07. Echocardiogram demonstrated normal LV wall motion and ejection fraction, LVH and left atrial enlargement.   3) Carotid Dopplers in July 2009 showed bilateral 50-79% stenosis.   4) Carotids 07/08/08 - 50-79% stenosis bilat   5) Echo 04/03/09 - TDS, moderate LVH, LVEF 65%; moderate LAE, RAE, mild AS (mean gradient 10), mild MR   6) Echo 1/11 - mild LVH, LAE, EF 55%, mild Pulm HTN, mild MR   7) Cath 06/05/09- DES to RCA   8) cath  06/22/09 - LAD 50%, LCX 100%, mid RCA patent stent, distal RCA 40%   9) Echo 06/22/09 - mild LVH, EF 55-60%, mild LAE, mild MR/TR, PASP 60   10) Cath 07/18/09 - patent stent and stable findings; EF 60%    11) Cath 10/16/09 - 85% ulcerated plaque mid RCA successfully stented with 2.75 Xience. CTO Cx successful PTCA and DES of proximal part with 2.25 Atom    Current outpatient prescriptions   Medication Sig Dispense Refill   ??? omeprazole (PRILOSEC) 20 mg capsule Take 20 mg by mouth daily.       ??? simvastatin (ZOCOR) 40 mg tablet Take 20 mg by mouth nightly.       ??? metoprolol (LOPRESSOR) 50 mg tablet Take 1 Tab by mouth two (2) times a day.  60 Tab  6   ??? lisinopril (PRINIVIL, ZESTRIL) 20 mg tablet Take 1 Tab by mouth nightly.  30 Tab  6   ??? insulin glargine (LANTUS) 100 unit/mL injection 20 Units by SubCUTAneous route nightly.  1 Vial  6   ??? insulin aspart 100 unit/mL Soln 4 Units injection by SubCUTAneous route four (4) times daily (with meals).  100 mL  prn   ??? insulin aspart (NOVOLOG) 100 unit/mL injection 0.04 mL by SubCUTAneous route three (3) times daily (before meals).  10 mL  0   ??? bumetanide (BUMEX) 2 mg tablet Take 0.5 Tabs by mouth daily.  30 Tab  0   ??? prasugrel (EFFIENT) 10 mg tablet Take 1 Tab by mouth daily. FIRST DOSE TODAY SHOULD BE 60 MG  45 Tab  4   ??? misoprostol (CYTOTEC) 200 mcg tablet Take 1 Tab by mouth two (2) times a day.  60 Tab  3   ??? terazosin (HYTRIN) 5 mg capsule Take 10 mg by mouth nightly.       ??? aspirin 81 mg tablet Take  by mouth daily.       ??? omega-3 fatty acids-vitamin e (FISH OIL) 1,000 mg Cap Take  by mouth two (2) times a day.       ??? levothyroxine (SYNTHROID) 150 mcg tablet Take  by mouth daily (before breakfast).       ??? nitroglycerin (NITROSTAT) 0.4 mg SL tablet by SubLINGual route every five (5) minutes as needed.       ??? ranitidine hcl (ZANTAC) 150 mg capsule Take 150 mg by mouth two (2) times a day.           No Known Allergies    Review of Systems:   Constitutional: Negative for fever, chills, weight loss, malaise/fatigue and diaphoresis.   HEENT: Negative for nosebleeds, congestion, neck pain, tinnitus, and vision changes.    Respiratory: Negative for hemoptysis, sputum production, and wheezing.   Gastrointestinal: Negative for  diarrhea, constipation, blood in stool and melena.   Genitourinary: Negative for dysuria, and hematuria.   Musculoskeletal: Negative for myalgias.   Skin: Negative for rash and itching.   Heme: Does not bleed or bruise easily.   Neurological: Negative for speech change and focal weakness.     Physical Exam:   BP 134/70   Pulse 68   Ht 5\' 6"  (1.676 m)   Wt 190 lb (86.183 kg)   BMI 30.67 kg/m2   Patient appears generally well, mood and affect are appropriate and pleasant.   HEENT: Normocephalic, atraumatic.   Neck Exam: Supple, Left carotid bruit.   Lung Exam: Clear to auscultation, even breath sounds.   Cardiac  Exam: Regular rate and rhythm with 2/6 systolic murmur.   Abdomen: Soft, non-tender, normal bowel sounds. No bruits or masses.   Extremities: mild lower extremity edema   Vascular: difficult to palpate dorsalis pedis pulses bilaterally.     Labwork was done on 12/15/09 at [facility] - he says renal function and Hgb stable    Assessment and Plan:   1) GI - on PPI and misoprostol - no obvious bleeding and Hgb has been stable  2) CAD - no unstable symptoms; continue with Effient  3) HTN - BP; denies problems with orthostasis/hypotension   4) Edema - stable; on daily Bumex; he will increase the bumex to 1 mg daily since he's gained a few pounds; is to see renal for labs in 2 weeks   5) CKD - sees renal  6) Chronic anemia - on monthly procrit shots   7) DM - per endocrine   9) RTC three months    Tharon Aquas, MD, [facility]

## 2010-01-03 ENCOUNTER — Encounter

## 2010-01-03 MED ORDER — BUMETANIDE 2 MG TAB
2 mg | ORAL_TABLET | Freq: Every day | ORAL | Status: DC
Start: 2010-01-03 — End: 2010-04-04

## 2010-01-03 MED ORDER — POTASSIUM CHLORIDE SR 10 MEQ TAB, PARTICLES/CRYSTALS
10 mEq | ORAL_TABLET | Freq: Every day | ORAL | Status: DC
Start: 2010-01-03 — End: 2010-01-23

## 2010-01-23 ENCOUNTER — Encounter

## 2010-01-23 MED ORDER — POTASSIUM CHLORIDE SR 10 MEQ TAB, PARTICLES/CRYSTALS
10 mEq | ORAL_TABLET | Freq: Every day | ORAL | Status: DC
Start: 2010-01-23 — End: 2011-02-05

## 2010-01-23 MED ORDER — MISOPROSTOL 200 MCG TAB
200 mcg | ORAL_TABLET | Freq: Two times a day (BID) | ORAL | Status: DC
Start: 2010-01-23 — End: 2010-01-26

## 2010-01-26 ENCOUNTER — Encounter

## 2010-01-26 MED ORDER — MISOPROSTOL 200 MCG TAB
200 mcg | ORAL_TABLET | Freq: Two times a day (BID) | ORAL | Status: AC
Start: 2010-01-26 — End: ?

## 2010-02-01 NOTE — Progress Notes (Signed)
This med to come from pcp cbp rn

## 2010-02-07 NOTE — Progress Notes (Signed)
Tharon Aquas, MD. [facility]  [address], [address]  [place], [place] [address]  Ph [phone];   Fax [phone]      Patient: Travis [name]  DOB: [birth year]      History of Present Illness:  Travis [name] is here for hospital FU.  He was noted to be anemic prior to procrit shot (Hgb 6.5) - after he had chest pain at infusion center at [facility], he was admitted there.  He ruled in for NSTEMI with trop 3.3 - it was thought to be related to significant anemia - the bleeding was thought to be possibly from small bowel AVM's. Since then he seems to be doing well.  His Effient was stopped.  No chest pain.  Feels well in general - much better since his transfusion.  Blood pressure has been good. No lightheadedness.        Past Medical History   Diagnosis Date   ??? CAD (coronary artery disease)      see problem list   ??? HTN (hypertension)    ??? PVD (peripheral vascular disease)    ??? DM (diabetes mellitus)    ??? Hyperlipidemia    ??? Sleep apnea      on CPAP   ??? COPD (chronic obstructive pulmonary disease)      mild COPD, recurrent pneumonia, chronic bronchitis (followed by Dr. Leroy Libman)   ??? CKD (chronic kidney disease)      Cr 1.7 in 10/09   ??? CHF (congestive heart failure)    ??? Anemia    ??? Pulmonary hypertension      PASP 60 on echo 06/22/09   ??? Thromboembolus      leg    ??? Thyroid disease    ??? Pneumonia    ??? GERD (gastroesophageal reflux disease)    ??? Chronic pain      back pain   ??? Cataract    ??? GI bleed      multiple bleeding episodes and blood transfusions.  Dr. Monika Salk has followed..  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       Cardiac History:    Travis [name] has known coronary artery disease dating back to May 1999 when he underwent cardiac catheterization prior to planned peripheral vascular surgery. This showed distal disease in the apical LAD and right coronary artery with a normal ejection fraction.Non q MI in 7/06.cath showed mod LAD abn RCA disease up to 50-60% and a small occluded LCX with normal LVEF. Had elevated troponin (?NSTEMI) during admission with pneumonia and ARF 05/2009 - proceeded to have a PCI (DES) to RCA. He returned for a NSTEMI (trop ~30) on 06/21/09 but cath showed stable findings. NSTEMI with another cath in 2/11 - the stent was patent and no new findings. Multiple NSTEMI's in 5/11 - proceeded on 10/16/09 to PCI of 85% ulcerated plaque mid RCA successfully stented with 2.75 Xience. CTO Cx successful PTCA and DES of proximal part with 2.25 Atom.   Prior Cardiac Eval:   X) Cath 2003 and 2006 - chronic LCX occlusion   1) stress test 02/09/07. The type of test was a dobutamine stress perfusion imaging. Cardiac stress testing was negative for chest pain and myocardial ischemia. Myocardial perfusion imaging showed reversible defect(s) involving the inferior wall. Gated EF (%): 55.   2) echocardiogram 01/19/07. Echocardiogram demonstrated normal LV wall motion and ejection fraction, LVH and left atrial enlargement.   3) Carotid Dopplers in July 2009 showed bilateral 50-79% stenosis.   4) Carotids  07/08/08 - 50-79% stenosis bilat   5) Echo 04/03/09 - TDS, moderate LVH, LVEF 65%; moderate LAE, RAE, mild AS (mean gradient 10), mild MR   6) Echo 1/11 - mild LVH, LAE, EF 55%, mild Pulm HTN, mild MR   7) Cath 06/05/09- DES to RCA   8) cath 06/22/09 - LAD 50%, LCX 100%, mid RCA patent stent, distal RCA 40%   9) Echo 06/22/09 - mild LVH, EF 55-60%, mild LAE, mild MR/TR, PASP 60   10) Cath 07/18/09 - patent stent and stable findings; EF 60%    11) Cath 10/16/09 - 85% ulcerated plaque mid RCA successfully stented with 2.75 Xience. CTO Cx successful PTCA and DES of proximal part with 2.25 Atom  12) Echo 01/27/10 - cLVH and normal systolic function, mild-mod MR, mild LAE    Current outpatient prescriptions   Medication Sig Dispense Refill   ??? misoprostol (CYTOTEC) 200 mcg tablet Take 1 Tab by mouth two (2) times a day.  60 Tab  3   ??? potassium chloride (K-DUR, KLOR-CON) 10 mEq tablet Take 1 Tab by mouth daily.  30 Tab  6   ??? bumetanide (BUMEX) 2 mg tablet Take 0.5 Tabs by mouth daily.  30 Tab  6   ??? omeprazole (PRILOSEC) 20 mg capsule Take 20 mg by mouth daily.       ??? simvastatin (ZOCOR) 40 mg tablet Take 20 mg by mouth nightly.       ??? metoprolol (LOPRESSOR) 50 mg tablet Take 1 Tab by mouth two (2) times a day.  60 Tab  6   ??? lisinopril (PRINIVIL, ZESTRIL) 20 mg tablet Take 1 Tab by mouth nightly.  30 Tab  6   ??? insulin glargine (LANTUS) 100 unit/mL injection 20 Units by SubCUTAneous route nightly.  1 Vial  6   ??? insulin aspart 100 unit/mL Soln 4 Units injection by SubCUTAneous route four (4) times daily (with meals).  100 mL  prn   ??? insulin aspart (NOVOLOG) 100 unit/mL injection 0.04 mL by SubCUTAneous route three (3) times daily (before meals).  10 mL  0   ??? terazosin (HYTRIN) 5 mg capsule Take 10 mg by mouth nightly.       ??? aspirin 81 mg tablet Take  by mouth daily.       ??? omega-3 fatty acids-vitamin e (FISH OIL) 1,000 mg Cap Take  by mouth two (2) times a day.       ??? levothyroxine (SYNTHROID) 150 mcg tablet Take  by mouth daily (before breakfast).       ??? nitroglycerin (NITROSTAT) 0.4 mg SL tablet by SubLINGual route every five (5) minutes as needed.       ??? ranitidine hcl (ZANTAC) 150 mg capsule Take 150 mg by mouth two (2) times a day.       ??? prasugrel (EFFIENT) 10 mg tablet Take 1 Tab by mouth daily. FIRST DOSE TODAY SHOULD BE 60 MG  45 Tab  4     No Known Allergies    Review of Systems:    Constitutional: Negative for fever, chills, and diaphoresis.   HEENT: Negative for nosebleeds, congestion, neck pain, tinnitus, and vision changes.   Respiratory: Negative for hemoptysis, sputum production, and wheezing.   Gastrointestinal: Negative for diarrhea, constipation.   Genitourinary: Negative for dysuria, and hematuria.   Musculoskeletal: Negative for myalgias.   Skin: Negative for rash and itching.   Heme: Does not bruise easily.   Neurological: Negative for speech  change and focal weakness.     Physical Exam:   BP 128/80   Pulse 60   Ht 5\' 6"  (1.676 m)   Wt 191 lb (86.637 kg)   BMI 30.83 kg/m2   Patient appears generally well, mood and affect are appropriate and pleasant.   HEENT: Normocephalic, atraumatic.   Neck Exam: Supple, Left carotid bruit.   Lung Exam: Clear to auscultation, even breath sounds.   Cardiac Exam: Regular rate and rhythm with 2/6 systolic murmur.   Abdomen: Soft, non-tender, normal bowel sounds. No bruits or masses.   Extremities: mild lower extremity edema   Vascular: difficult to palpate dorsalis pedis pulses bilaterally.     Assessment and Plan:   1) GI - Due to recurrent GI bleeding his Effient had to be stopped.  He says his last CBC since discharge was stable.  Is to FU with Dr. Tye Savoy.  2) CAD - he unfortunately had to stop Effient due to recurrent GI bleeding.  He did complete 3 months of dual antiplatelet therapy, although would have preferred one year uninterrupted if we could.  Going forward I have asked Travis [name] to increase the Aspirin to 325 mg daily.  Will refer to cardiac rehab.  For now will continue with Zocor as his statin, but consider switching to Lipitor when it becomes generic.  3) HTN - BP stable; denies problems with orthostasis/hypotension   4) Edema - stable; on daily Bumex  5) CKD - stable renal function at discharge  6) Chronic anemia - on monthly procrit shots  7) DM - per endocrine   9) RTC three months     Tharon Aquas, MD, [facility]

## 2010-04-04 NOTE — Progress Notes (Addendum)
Pt c/o chest discomfort. Rated a 3 on a one to ten scale. States he was going to take a nitro at home but did not want to be late for his appt. EKG done prior to giving NTG sl. Unable to be supine because of "fluid in lungs". Mild audible inspiratory wheezes.  1 NTG sl given at 1325. Pt looks pale in the face. 1335-pain free upon return from the restroom. Skin color much improved.

## 2010-04-04 NOTE — Progress Notes (Signed)
Tharon Aquas, MD. [facility]  [address], [address]  [place], [place] [address]  Ph [phone];   Fax [phone]      Patient: Travis [name]  DOB: [birth year]      History of Present Illness:  Here for routine follow-up.  He had 3/10 chest pain earlier today - better with SL NTG.  Pain is now gone and he has pain occasionally.  He had been building some edema and now takes a whole bumex (2 mg) daily - doing better at this dose.  He feels well in general and feels well now.          Past Medical History   Diagnosis Date   ??? CAD (coronary artery disease)      see problem list   ??? HTN (hypertension)    ??? PVD (peripheral vascular disease)    ??? DM (diabetes mellitus)    ??? Hyperlipidemia    ??? Sleep apnea      on CPAP   ??? COPD (chronic obstructive pulmonary disease)      mild COPD, recurrent pneumonia, chronic bronchitis (followed by Dr. Leroy Libman)   ??? CKD (chronic kidney disease)      Cr 1.7 in 10/09   ??? CHF (congestive heart failure)    ??? Anemia    ??? Pulmonary hypertension      PASP 60 on echo 06/22/09   ??? Thromboembolus      leg    ??? Thyroid disease    ??? Pneumonia    ??? GERD (gastroesophageal reflux disease)    ??? Chronic pain      back pain   ??? Cataract    ??? GI bleed      multiple bleeding episodes and blood transfusions.  Dr. Monika Salk has followed..  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       Cardiac History:    Mr. Petrosky has known coronary artery disease dating back to May 1999 when he underwent cardiac catheterization prior to planned peripheral vascular surgery. This showed distal disease in the apical LAD and right coronary artery with a normal ejection fraction.Non q MI in 7/06.cath showed mod LAD abn RCA disease up to 50-60% and a small occluded LCX with normal LVEF. Had elevated troponin (?NSTEMI) during admission with pneumonia and ARF 05/2009 - proceeded to have a PCI (DES) to RCA. He returned for a NSTEMI (trop ~30) on 06/21/09 but cath showed stable findings. NSTEMI with another cath in 2/11 - the stent was patent and no new findings. Multiple NSTEMI's in 5/11 - proceeded on 10/16/09 to PCI of 85% ulcerated plaque mid RCA successfully stented with 2.75 Xience. CTO Cx successful PTCA and DES of proximal part with 2.25 Atom.   Prior Cardiac Eval:   X) Cath 2003 and 2006 - chronic LCX occlusion   1) stress test 02/09/07. The type of test was a dobutamine stress perfusion imaging. Cardiac stress testing was negative for chest pain and myocardial ischemia. Myocardial perfusion imaging showed reversible defect(s) involving the inferior wall. Gated EF (%): 55.   2) echocardiogram 01/19/07. Echocardiogram demonstrated normal LV wall motion and ejection fraction, LVH and left atrial enlargement.   3) Carotid Dopplers in July 2009 showed bilateral 50-79% stenosis.   4) Carotids 07/08/08 - 50-79% stenosis bilat   5) Echo 04/03/09 - TDS, moderate LVH, LVEF 65%; moderate LAE, RAE, mild AS (mean gradient 10), mild MR   6) Echo 1/11 - mild LVH, LAE, EF 55%, mild Pulm HTN, mild  MR   7) Cath 06/05/09- DES to RCA   8) cath 06/22/09 - LAD 50%, LCX 100%, mid RCA patent stent, distal RCA 40%   9) Echo 06/22/09 - mild LVH, EF 55-60%, mild LAE, mild MR/TR, PASP 60   10) Cath 07/18/09 - patent stent and stable findings; EF 60%    11) Cath 10/16/09 - 85% ulcerated plaque mid RCA successfully stented with 2.75 Xience. CTO Cx successful PTCA and DES of proximal part with 2.25 Atom   12) Echo 01/27/10 - cLVH and normal systolic function, mild-mod MR, mild LAE    Current outpatient prescriptions   Medication Sig Dispense Refill   ??? bumetanide (BUMEX) 2 mg tablet Take 2 mg by mouth daily.       ??? misoprostol (CYTOTEC) 200 mcg tablet Take 1 Tab by mouth two (2) times a day.  60 Tab  3   ??? potassium chloride (K-DUR, KLOR-CON) 10 mEq tablet Take 1 Tab by mouth daily.  30 Tab  6   ??? omeprazole (PRILOSEC) 20 mg capsule Take 20 mg by mouth daily.       ??? simvastatin (ZOCOR) 40 mg tablet Take 20 mg by mouth nightly.       ??? metoprolol (LOPRESSOR) 50 mg tablet Take 1 Tab by mouth two (2) times a day.  60 Tab  6   ??? lisinopril (PRINIVIL, ZESTRIL) 20 mg tablet Take 1 Tab by mouth nightly.  30 Tab  6   ??? insulin glargine (LANTUS) 100 unit/mL injection 20 Units by SubCUTAneous route nightly.  1 Vial  6   ??? insulin aspart (NOVOLOG) 100 unit/mL injection 0.04 mL by SubCUTAneous route three (3) times daily (before meals).  10 mL  0   ??? terazosin (HYTRIN) 5 mg capsule Take 10 mg by mouth nightly.       ??? aspirin 81 mg tablet Take  by mouth daily.       ??? omega-3 fatty acids-vitamin e (FISH OIL) 1,000 mg Cap Take  by mouth two (2) times a day.       ??? levothyroxine (SYNTHROID) 150 mcg tablet Take  by mouth daily (before breakfast).       ??? nitroglycerin (NITROSTAT) 0.4 mg SL tablet by SubLINGual route [name] five (5) minutes as needed.       ??? ranitidine hcl (ZANTAC) 150 mg capsule Take 150 mg by mouth two (2) times a day.       ??? insulin aspart 100 unit/mL Soln 4 Units injection by SubCUTAneous route four (4) times daily (with meals).  100 mL  prn       No Known Allergies    History   Substance Use Topics   ??? Smoking status: Former Smoker -- 20 years     Types: Cigarettes     Quit date: 11/09/1978   ??? Smokeless tobacco: Never Used   ??? Alcohol Use: No        Review of Systems:   Constitutional: Negative for fever, chills, and diaphoresis.   HEENT: Negative for nosebleeds, congestion, neck pain, tinnitus, and vision changes.   Respiratory: Negative for hemoptysis, sputum production, and wheezing.   Gastrointestinal: Negative for diarrhea, constipation.   Genitourinary: Negative for dysuria, and hematuria.   Musculoskeletal: Negative for myalgias.   Skin: Negative for rash and itching.   Heme: Does not bruise easily.   Neurological: Negative for speech change and focal weakness.     Physical Exam:   BP 156/84   Pulse  94   Ht 5\' 6"  (1.676 m)   Wt 190 lb (86.183 kg)   BMI 30.67 kg/m2   Patient appears generally well, mood and affect are appropriate and pleasant.   HEENT: Normocephalic, atraumatic.   Neck Exam: Supple, Left carotid bruit. No JVD sitting up.    Lung Exam: Clear to auscultation, even breath sounds.   Cardiac Exam: Regular rate and rhythm with 2/6 systolic murmur.   Abdomen: Soft, non-tender, normal bowel sounds. No bruits or masses.   Extremities: mild lower extremity edema   Vascular: difficult to palpate dorsalis pedis pulses bilaterally.     EKG today - NSR, 1st degree AV block; non-specific IVCD    Assessment and Plan:   1) CHF - weight is stable past 2 months but up 15 lbs compared with June.  Will see how he does on higher dose of Bumex.  Asked that he follow weight daily.  2) Chest pain - has stable angina.  If he has any refractory chest pain, he is to call 911.  Continue medical management for CAD.  For now will continue with Zocor as his statin, but plan on switching to Lipitor as it becomes generic.   3) Anemia - Due to recurrent GI bleeding his Effient had to be stopped.  Gets procrit shots [name] 2 weeks with Renal.     4) HTN - BP high today. I've asked him to follow BP at home next 2 weeks and follow up at that time.    5) RTC two weeks - will assess volume status and BP at that time.     Tharon Aquas, MD, [facility]

## 2010-05-25 NOTE — Telephone Encounter (Signed)
Pls call pt wants to know if a bathroom scale will effect the pacemaker? Thanks

## 2010-05-25 NOTE — Telephone Encounter (Signed)
Informed pt ok to use reg electronic scale with pacer.  Also informed pt that he is overdue for pacer check & we had called to schedule appt before with no response.  Appt made for 1/9 STF.

## 2010-06-05 NOTE — Progress Notes (Signed)
Scanned Pacemaker Report in Chart Review.

## 2010-09-19 NOTE — Progress Notes (Signed)
Chargeable visit   See scanned pacemaker report in chart review.

## 2010-10-12 NOTE — Op Note (Signed)
[place]. [facility]  Patient:    Kenneth Montoya, Kenneth Montoya Visit Number: [id number] MRN: [id number]          Service Type: DSU Location: [facility] Attending Physician:  Colbert Ewing Proc. Date: 01/14/01 Adm. Date:  01/14/2001                             Operative Report  PREOPERATIVE DIAGNOSIS:  Complete acute on chronic rotator cuff avulsion left shoulder, chronic long head biceps tendon rupture, chronic impingement and degenerative joint disease acromioclavicular joint.  POSTOPERATIVE DIAGNOSIS:  Complete acute on chronic rotator cuff avulsion left shoulder, chronic long head biceps tendon rupture, chronic impingement and degenerative joint disease acromioclavicular joint.  OPERATION:  Left shoulder exam under anesthesia, arthroscopy with debridement of cuff tear and assessment of cuff tear tissue.  Arthroscopic acromioplasty with acromioclavicular release and bursectomy.  Excision distal clavicle. Mini open repair rotator cuff avulsion with fiber wire suture and Concept repair system.  SURGEON:  Loreta Ave, M.D.  ASSISTANT:  Arlys John D. Petrarca, P.A.-C.  ANESTHESIA:  General.  ESTIMATED BLOOD LOSS: Minimal.  SPECIMENS:  None.  COMPLICATIONS:  None.  DRESSINGS:  Soft compressive with shoulder mobilizer.  DESCRIPTION OF PROCEDURE:  The patient was brought to the operating room and placed on the operating table in the supine position.  After adequate anesthesia had been obtained, the left shoulder was examined.  Full motion, good stability.  Placed in the beach chair position in the shoulder positioner.  Prepped and draped in the usual sterile fashion.  Three standard arthroscopic portals anterior posterior lateral.  Shoulder entered with a blunt obturator, distended and inspected.  Chronic biceps tendon rupture with the long head absent.  Complete avulsion entire supraspinatus with some interval tearing anterior and posterior aspects  of the spinatus.  Retracted but still mobile.  The tear was a little bit medial to the attachment. Articular cartilage, labrum, capsular ligament and structures intact.  From above, type II acromion with chronic impingement.  The cuff was debrided, assessed and found to be repairable.  Acromioplasty with shaver and high speed bur to a type I acromion.  Grade IV changes to the distal clavicle. Lateral 1 cm sharply resected.  Adequacy of decompression and clavicle excision confirmed via small portals.  Instruments and fluid removed.  Lateral portal opened into a deltoid-splitting incision.  The subacromial space accessed. Cuff well captured with three #2 fiber wire sutures.  Interval tearing sewn with #2 Ethibond.  Bony trough created in the humerus off of the tuberosity and then the Concept Repair System was used to create bony tunnels through which the fiber wire sutures were passed.  Once completed, the fiber wire sutures were pulled down tightly bringing the cuff down to attach it at the tuberosity and then the sutures tied over a bony bridge laterally.  Nice, firm, water-tight closure without undue tension.  Adequacy of decompression confirmed digitally at the time of cuff repair.  Wound thoroughly irrigated. Deltoid closed with Vicryl.  Skin and subcutaneous tissue with Vicryl. Portals closed with nylon.  Margins of the wound injected with Marcaine as was the incision and subacromial space.  Sterile compressive dressing applied. Shoulder immobilizer applied.  Anesthesia reversed.  Brought to the recovery room.  He tolerated the procedure well with no complications. Attending Physician:  Colbert Ewing DD:  01/14/01 TD:  01/15/01 Job: [id number]

## 2010-11-14 NOTE — Progress Notes (Addendum)
Travis Aquas, MD. [facility]  [address]  [place], [place] [address]  Ph [phone];   Fax [phone]      Patient: Travis [name]  DOB: 06-18-1940      PCP - [facility]    History of Present Illness:  Travis [name] is here for follow-up.  No chest pain or problems bleeding.  He does get tired some days.  He goes to bed 3-4 AM some days (always slept late).  Breathing and edema stable. Other than right knee pain, he feels well.       Past Medical History   Diagnosis Date   ??? CAD (coronary artery disease)      see problem list   ??? HTN (hypertension)    ??? PVD (peripheral vascular disease)    ??? DM (diabetes mellitus)    ??? Hyperlipidemia    ??? Sleep apnea      on CPAP   ??? COPD (chronic obstructive pulmonary disease)      mild COPD, recurrent pneumonia, chronic bronchitis (followed by Dr. Leroy Libman)   ??? CKD (chronic kidney disease)      Cr 1.7 in 10/09   ??? CHF (congestive heart failure)    ??? Anemia    ??? Pulmonary hypertension      PASP 60 on echo 06/22/09   ??? Thromboembolus      leg    ??? Thyroid disease    ??? Pneumonia    ??? GERD (gastroesophageal reflux disease)    ??? Chronic pain      back pain   ??? Cataract    ??? GI bleed      multiple bleeding episodes and blood transfusions.  Dr. Monika Salk has followed..  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       Cardiac History:    Travis [name] has known coronary artery disease dating back to May 1999 when he underwent cardiac catheterization prior to planned peripheral vascular surgery. This showed distal disease in the apical LAD and right coronary artery with a normal ejection fraction.Non q MI in 7/06.cath showed mod LAD abn RCA disease up to 50-60% and a small occluded LCX with normal LVEF. Had elevated troponin (?NSTEMI) during admission with pneumonia and ARF 05/2009 - proceeded to have a PCI (DES) to RCA. He returned for a NSTEMI (trop ~30) on 06/21/09 but cath showed stable findings. NSTEMI with another cath in 2/11 - the stent was patent and no new findings. Multiple NSTEMI's in 5/11 - proceeded on 10/16/09 to PCI of 85% ulcerated plaque mid RCA successfully stented with 2.75 Xience. CTO Cx successful PTCA and DES of proximal part with 2.25 Atom.   Prior Cardiac Eval:   X) Cath 2003 and 2006 - chronic LCX occlusion   1) stress test 02/09/07. The type of test was a dobutamine stress perfusion imaging. Cardiac stress testing was negative for chest pain and myocardial ischemia. Myocardial perfusion imaging showed reversible defect(s) involving the inferior wall. Gated EF (%): 55.   2) echocardiogram 01/19/07. Echocardiogram demonstrated normal LV wall motion and ejection fraction, LVH and left atrial enlargement.   3) Carotid Dopplers in July 2009 showed bilateral 50-79% stenosis.   4) Carotids 07/08/08 - 50-79% stenosis bilat   5) Echo 04/03/09 - TDS, moderate LVH, LVEF 65%; moderate LAE, RAE, mild AS (mean gradient 10), mild MR   6) Echo 1/11 - mild LVH, LAE, EF 55%, mild Pulm HTN, mild MR   7) Cath 06/05/09- DES to RCA  8) cath 06/22/09 - LAD 50%, LCX 100%, mid RCA patent stent, distal RCA 40%   9) Echo 06/22/09 - mild LVH, EF 55-60%, mild LAE, mild MR/TR, PASP 60   10) Cath 07/18/09 - patent stent and stable findings; EF 60%    11) Cath 10/16/09 - 85% ulcerated plaque mid RCA successfully stented with 2.75 Xience. CTO Cx successful PTCA and DES of proximal part with 2.25 Atom   12) Echo 01/27/10 - cLVH and normal systolic function, mild-mod MR, mild LAE        Past Surgical History   Procedure Date   ??? Hx orthopaedic      knee replacement   ??? Vascular surgery procedure unlist      arterial bypass   ??? Hx pacemaker    ??? Cardiac surg procedure unlist      cardiac stent   ??? Hx cataract removal      removed rt eye       Current Outpatient Prescriptions   Medication Sig Dispense Refill   ??? insulin aspart (NOVOLOG) 100 unit/mL injection by SubCUTAneous route Before breakfast and dinner.         ??? insulin glargine (LANTUS) 100 unit/mL injection by SubCUTAneous route once.         ??? bumetanide (BUMEX) 2 mg tablet Take 2 mg by mouth daily.       ??? potassium chloride (K-DUR, KLOR-CON) 10 mEq tablet Take 1 Tab by mouth daily.  30 Tab  6   ??? omeprazole (PRILOSEC) 20 mg capsule Take 20 mg by mouth daily.       ??? simvastatin (ZOCOR) 40 mg tablet Take 20 mg by mouth nightly.       ??? metoprolol (LOPRESSOR) 50 mg tablet Take 1 Tab by mouth two (2) times a day.  60 Tab  6   ??? lisinopril (PRINIVIL, ZESTRIL) 20 mg tablet Take 1 Tab by mouth nightly.  30 Tab  6   ??? terazosin (HYTRIN) 5 mg capsule Take 10 mg by mouth nightly.       ??? aspirin 81 mg tablet Take  by mouth daily.       ??? omega-3 fatty acids-vitamin e (FISH OIL) 1,000 mg Cap Take  by mouth two (2) times a day.       ??? levothyroxine (SYNTHROID) 150 mcg tablet Take  by mouth daily (before breakfast).       ??? ranitidine hcl (ZANTAC) 150 mg capsule Take 150 mg by mouth two (2) times a day.       ??? misoprostol (CYTOTEC) 200 mcg tablet Take 1 Tab by mouth two (2) times a day.  60 Tab  3   ??? nitroglycerin (NITROSTAT) 0.4 mg SL tablet by SubLINGual route every five (5) minutes as needed.           No Known Allergies    History   Substance Use Topics   ??? Smoking status: Former Smoker -- 20 years      Types: Cigarettes     Quit date: 11/09/1978   ??? Smokeless tobacco: Never Used   ??? Alcohol Use: No       Review of Systems:   Constitutional: Negative for fever, chills, and diaphoresis. + tires easily  HEENT: Negative for nosebleeds, congestion, neck pain, tinnitus, and vision changes.   Respiratory: Negative for hemoptysis, sputum production, and wheezing.   Gastrointestinal: Negative for diarrhea, constipation.   Genitourinary: Negative for dysuria, and hematuria.   Musculoskeletal: + right  knee pain.    Skin: Negative for rash and itching.   Heme: Does not bruise easily.   Neurological: Negative for speech change and focal weakness.       Physical Exam:   BP 120/76   Pulse 69   Ht 5\' 6"  (1.676 m)   Wt 193 lb 6.4 oz (87.726 kg)   BMI 31.22 kg/m2   Patient appears generally well, mood and affect are appropriate and pleasant.   HEENT: Normocephalic, atraumatic.   Neck Exam: Supple, Left carotid bruit. No JVD sitting up.   Lung Exam: Clear to auscultation, even breath sounds.   Cardiac Exam: Regular rate and rhythm with 2/6 systolic murmur.   Abdomen: Soft, non-tender, normal bowel sounds. No bruits or masses.   Extremities: mild lower extremity edema   Vascular: difficult to palpate dorsalis pedis pulses bilaterally.         Assessment and Plan:   1) CHF - compensated and stable.  Continue meds.   2) CAD - denies chest pain  3) Dyslipidemia - check lipids  4) Anemia - stable; has been getting Epo shots from renal  5) CKD - stable - sees Renal  6) HTN - BP looks good  7) Carotid artery disease - recheck carotids next visit  8) RTC six months    Travis Aquas, MD, [facility]       ADDENDUM   11/25/2010  Labs 11/19/10 - Cr 2.1, K 3.9, Na 139, LFT's nml

## 2010-11-20 LAB — LIPID PANEL
Cholesterol, total: 153 mg/dL (ref 100–199)
HDL Cholesterol: 53 mg/dL (ref >39)
LDL, calculated: 83 mg/dL (ref 0–99)
Triglyceride: 86 mg/dL (ref 0–149)
VLDL, calculated: 17 mg/dL (ref 5–40)

## 2010-11-20 NOTE — Progress Notes (Signed)
Quick Note:    Please send him copy of lipids which look good. Other labs pending.  thx  ______

## 2011-01-14 NOTE — Progress Notes (Signed)
Chargeable visit. See scanned pacemaker report in chart review.

## 2011-02-05 ENCOUNTER — Encounter

## 2011-02-05 MED ORDER — POTASSIUM CHLORIDE SR 10 MEQ TAB, PARTICLES/CRYSTALS
10 mEq | ORAL_TABLET | Freq: Every day | ORAL | Status: DC
Start: 2011-02-05 — End: 2011-11-21

## 2011-03-11 MED ORDER — ALBUTEROL SULFATE HFA 90 MCG/ACTUATION AEROSOL INHALER
90 mcg/actuation | Freq: Four times a day (QID) | RESPIRATORY_TRACT | Status: AC | PRN
Start: 2011-03-11 — End: ?

## 2011-03-11 NOTE — Progress Notes (Signed)
Tharon Aquas, MD. [facility]  [address], [address]  [place], [place] [address]  Ph [phone];   Fax [phone]      Patient: Travis [name]  DOB: [birth year]      Today's Date: 03/11/2011        History of Present Illness:  Mr. Uy is here for preop evaluation prior to cataract surgery with regional anesthesia (Dr Gaylyn Lambert Bartholomew Boards).  He is doing well in general.  His biggest complaint is itching.  No problems with chest pain or SOB or lightheadedness. He takes occasional SL NTG (last used 3 weeks ago).          Past Medical History   Diagnosis Date   ??? CAD (coronary artery disease)      see problem list   ??? HTN (hypertension)    ??? PVD (peripheral vascular disease)    ??? DM (diabetes mellitus)    ??? Hyperlipidemia    ??? Sleep apnea      on CPAP   ??? COPD (chronic obstructive pulmonary disease)      mild COPD, recurrent pneumonia, chronic bronchitis (followed by Dr. Leroy Libman)   ??? CKD (chronic kidney disease)      Cr 1.7 in 10/09   ??? CHF (congestive heart failure)    ??? Anemia    ??? Pulmonary hypertension      PASP 60 on echo 06/22/09   ??? Thromboembolus      leg    ??? Thyroid disease    ??? Pneumonia    ??? GERD (gastroesophageal reflux disease)    ??? Chronic pain      back pain   ??? Cataract    ??? GI bleed      multiple bleeding episodes and blood transfusions.  Dr. Monika Salk has followed..  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       Past Surgical History   Procedure Date   ??? Hx orthopaedic      knee replacement   ??? Pr vascular surgery procedure unlist      arterial bypass   ??? Hx pacemaker    ??? Pr cardiac surg procedure unlist      cardiac stent   ??? Hx cataract removal      removed rt eye       Cardiac History:    Mr. Corsino has known coronary artery disease dating back to May 1999 when he underwent cardiac catheterization prior to planned peripheral vascular surgery. This showed distal disease in the apical LAD and right coronary artery with a normal ejection fraction.Non q MI in 7/06.cath showed mod LAD abn RCA disease up to 50-60% and a small occluded LCX with normal LVEF. Had elevated troponin (?NSTEMI) during admission with pneumonia and ARF 05/2009 - proceeded to have a PCI (DES) to RCA. He returned for a NSTEMI (trop ~30) on 06/21/09 but cath showed stable findings. NSTEMI with another cath in 2/11 - the stent was patent and no new findings. Multiple NSTEMI's in 5/11 - proceeded on 10/16/09 to PCI of 85% ulcerated plaque mid RCA successfully stented with 2.75 Xience. CTO Cx successful PTCA and DES of proximal part with 2.25 Atom.   Prior Cardiac Eval:   X) Cath 2003 and 2006 - chronic LCX occlusion   1) stress test 02/09/07. The type of test was a dobutamine stress perfusion imaging. Cardiac stress testing was negative for chest pain and myocardial ischemia. Myocardial perfusion imaging showed reversible defect(s) involving the inferior wall. Gated EF (%): 55.  2) echocardiogram 01/19/07. Echocardiogram demonstrated normal LV wall motion and ejection fraction, LVH and left atrial enlargement.   3) Carotid Dopplers in July 2009 showed bilateral 50-79% stenosis.   4) Carotids 07/08/08 - 50-79% stenosis bilat   5) Echo 04/03/09 - TDS, moderate LVH, LVEF 65%; moderate LAE, RAE, mild AS (mean gradient 10), mild MR   6) Echo 1/11 - mild LVH, LAE, EF 55%, mild Pulm HTN, mild MR   7) Cath 06/05/09- DES to RCA   8) cath 06/22/09 - LAD 50%, LCX 100%, mid RCA patent stent, distal RCA 40%   9) Echo 06/22/09 - mild LVH, EF 55-60%, mild LAE, mild MR/TR, PASP 60   10) Cath 07/18/09 - patent stent and stable findings; EF 60%    11) Cath 10/16/09 - 85% ulcerated plaque mid RCA successfully stented with 2.75 Xience. CTO Cx successful PTCA and DES of proximal part with 2.25 Atom   12) Echo 01/27/10 - cLVH and normal systolic function, mild-mod MR, mild LAE      Current Outpatient Prescriptions   Medication Sig Dispense Refill   ??? potassium chloride (K-DUR, KLOR-CON) 10 mEq tablet Take 1 Tab by mouth daily.  30 Tab  6   ??? insulin aspart (NOVOLOG) 100 unit/mL injection by SubCUTAneous route Before breakfast and dinner.         ??? insulin glargine (LANTUS) 100 unit/mL injection by SubCUTAneous route once.         ??? bumetanide (BUMEX) 2 mg tablet Take 2 mg by mouth daily.       ??? misoprostol (CYTOTEC) 200 mcg tablet Take 1 Tab by mouth two (2) times a day.  60 Tab  3   ??? omeprazole (PRILOSEC) 20 mg capsule Take 20 mg by mouth daily.       ??? simvastatin (ZOCOR) 40 mg tablet Take 20 mg by mouth nightly.       ??? metoprolol (LOPRESSOR) 50 mg tablet Take 1 Tab by mouth two (2) times a day.  60 Tab  6   ??? lisinopril (PRINIVIL, ZESTRIL) 20 mg tablet Take 1 Tab by mouth nightly.  30 Tab  6   ??? terazosin (HYTRIN) 5 mg capsule Take 10 mg by mouth nightly.       ??? aspirin 81 mg tablet Take  by mouth daily.       ??? omega-3 fatty acids-vitamin e (FISH OIL) 1,000 mg Cap Take  by mouth two (2) times a day.       ??? levothyroxine (SYNTHROID) 150 mcg tablet Take  by mouth daily (before breakfast).       ??? nitroglycerin (NITROSTAT) 0.4 mg SL tablet by SubLINGual route every five (5) minutes as needed.       ??? ranitidine hcl (ZANTAC) 150 mg capsule Take 150 mg by mouth two (2) times a day.           No Known Allergies      History   Substance Use Topics   ??? Smoking status: Former Smoker -- 20 years     Types: Cigarettes     Quit date: 11/09/1978   ??? Smokeless tobacco: Never Used   ??? Alcohol Use: No       Review of Systems:   Constitutional: Negative for fever, chills, and diaphoresis. + tires easily    HEENT: Negative for nosebleeds, congestion, neck pain, tinnitus, and vision changes.   Respiratory: Negative for hemoptysis, sputum production, and wheezing.   Gastrointestinal: Negative for diarrhea, constipation.  Genitourinary: Negative for dysuria, and hematuria.   Musculoskeletal: + right knee pain.   Skin: Negative for rash and itching.   Heme: Does not bruise easily.   Neurological: Negative for speech change and focal weakness.       Physical Exam:   BP 132/70   Pulse 81   Ht 5\' 6"  (1.676 m)   Wt 195 lb (88.451 kg)   BMI 31.47 kg/m2   Patient appears generally well, mood and affect are appropriate and pleasant.   HEENT: Normocephalic, atraumatic.   Neck Exam: Supple, Left carotid bruit. No JVD sitting up.   Lung Exam: Clear to auscultation, even breath sounds.   Cardiac Exam: Regular rate and rhythm with 2/6 systolic murmur.   Abdomen: Soft, non-tender, normal bowel sounds. No bruits or masses.   Extremities: mild lower extremity edema   Vascular: difficult to palpate dorsalis pedis pulses bilaterally.       Labs 11/19/10 - Cr 2.1, K 3.9, Na 139, LFT's nml    EKG today - NSR, 1st degree Av block, non-specific IVCD      Assessment and Plan:   1) Preop evaluation - Mr. Felten can proceed with his eye surgery and should have low risk of CV complications.   2) CHF - compensated and stable. Continue meds.   3) CAD - has chronic stable angina  4) [facility]  5) CKD - sees Renal   6) HTN - BP looks good   7) Carotid artery disease (moderate disease on last study)- check carotids next visit   8) RTC six months     Tharon Aquas, MD, [facility]

## 2011-03-11 NOTE — Progress Notes (Signed)
Addended by: Thurston Pounds C on: 03/11/2011 02:03 PM     Modules accepted: Orders

## 2011-03-11 NOTE — Progress Notes (Signed)
Surgery: cataract   Date: TBA   Location:    Surgeon: Dr Gaylyn Lambert Bartholomew Boards

## 2011-04-12 NOTE — Progress Notes (Signed)
Chargeable visit. See scanned pacemaker report in chart review.

## 2011-05-16 ENCOUNTER — Inpatient Hospital Stay
Admit: 2011-05-16 | Discharge: 2011-05-25 | Disposition: A | Discharge status: Home Health | Payer: MEDICARE | Attending: Internal Medicine | Admitting: Internal Medicine

## 2011-05-16 DIAGNOSIS — I214 Non-ST elevation (NSTEMI) myocardial infarction: Secondary | ICD-10-CM

## 2011-05-16 LAB — CBC WITH AUTOMATED DIFF
ABS. BASOPHILS: 0 10*3/uL (ref 0.0–0.1)
ABS. EOSINOPHILS: 0 10*3/uL (ref 0.0–0.4)
ABS. LYMPHOCYTES: 0.9 10*3/uL (ref 0.8–3.5)
ABS. MONOCYTES: 0.4 10*3/uL (ref 0.0–1.0)
ABS. NEUTROPHILS: 5.1 10*3/uL (ref 1.8–8.0)
BASOPHILS: 0 % (ref 0–1)
EOSINOPHILS: 0 % (ref 0–7)
HCT: 33.1 % — ABNORMAL LOW (ref 36.6–50.3)
HGB: 11.1 g/dL — ABNORMAL LOW (ref 12.1–17.0)
LYMPHOCYTES: 14 % (ref 12–49)
MCH: 30.2 PG (ref 26.0–34.0)
MCHC: 33.5 g/dL (ref 30.0–36.5)
MCV: 89.9 FL (ref 80.0–99.0)
MONOCYTES: 7 % (ref 5–13)
NEUTROPHILS: 79 % — ABNORMAL HIGH (ref 32–75)
PLATELET: 127 10*3/uL — ABNORMAL LOW (ref 150–400)
RBC: 3.68 M/uL — ABNORMAL LOW (ref 4.10–5.70)
RDW: 15.1 % — ABNORMAL HIGH (ref 11.5–14.5)
WBC: 6.5 10*3/uL (ref 4.1–11.1)

## 2011-05-16 LAB — HEPATIC FUNCTION PANEL
A-G Ratio: 0.7 — ABNORMAL LOW (ref 1.1–2.2)
ALT (SGPT): 23 U/L (ref 12–78)
AST (SGOT): 31 U/L (ref 15–37)
Albumin: 3.7 g/dL (ref 3.5–5.0)
Alk. phosphatase: 112 U/L (ref 50–136)
Bilirubin, direct: 0.2 MG/DL (ref 0.0–0.2)
Bilirubin, total: 0.6 MG/DL (ref 0.2–1.0)
Globulin: 5.2 g/dL — ABNORMAL HIGH (ref 2.0–4.0)
Protein, total: 8.9 g/dL — ABNORMAL HIGH (ref 6.4–8.2)

## 2011-05-16 LAB — MAGNESIUM: Magnesium: 2.1 MG/DL (ref 1.6–2.4)

## 2011-05-16 LAB — POC TROPONIN-I: Troponin-I (POC): 0.33 ng/mL — ABNORMAL HIGH (ref 0.00–0.08)

## 2011-05-16 LAB — GLUCOSE, POC: Glucose (POC): >600 mg/dL — CR (ref 75–110)

## 2011-05-16 LAB — TROPONIN I: Troponin-I, Qt.: 0.75 ng/mL — ABNORMAL HIGH (ref <0.05)

## 2011-05-16 LAB — NT-PRO BNP: NT pro-BNP: 2115 PG/ML — ABNORMAL HIGH (ref 0–125)

## 2011-05-16 MED ORDER — NITROGLYCERIN 2 % TRANSDERMAL OINTMENT
2 % | Freq: Two times a day (BID) | TRANSDERMAL | Status: DC
Start: 2011-05-16 — End: 2011-05-18
  Administered 2011-05-16 – 2011-05-18 (×4): via TOPICAL

## 2011-05-16 MED ORDER — FUROSEMIDE 10 MG/ML IJ SOLN
10 mg/mL | Freq: Once | INTRAMUSCULAR | Status: AC
Start: 2011-05-16 — End: 2011-05-16
  Administered 2011-05-16: via INTRAVENOUS

## 2011-05-16 MED ORDER — SODIUM CHLORIDE 0.9 % IV
100 unit/mL | INTRAVENOUS | Status: DC
Start: 2011-05-16 — End: 2011-05-16
  Administered 2011-05-16 – 2011-05-17 (×3): via INTRAVENOUS

## 2011-05-16 MED ORDER — INSULIN REGULAR HUMAN 100 UNIT/ML INJECTION
100 unit/mL | INTRAMUSCULAR | Status: AC
Start: 2011-05-16 — End: 2011-05-16
  Administered 2011-05-16: 23:00:00 via INTRAVENOUS

## 2011-05-16 MED ORDER — ASPIRIN 325 MG TAB
325 mg | ORAL | Status: AC
Start: 2011-05-16 — End: 2011-05-16
  Administered 2011-05-16: 22:00:00 via ORAL

## 2011-05-16 MED ORDER — ONDANSETRON (PF) 4 MG/2 ML INJECTION
4 mg/2 mL | INTRAMUSCULAR | Status: AC
Start: 2011-05-16 — End: 2011-05-16
  Administered 2011-05-16: 22:00:00 via INTRAVENOUS

## 2011-05-16 NOTE — ED Provider Notes (Addendum)
Patient is a 70 y.o. male presenting with chest pain. The history is provided by the patient.   Chest Pain (Angina)   This is a new problem. The current episode started more than 2 days ago. Progression since onset: got better with NTG, but today it came back and did not change with NTG. The pain is associated with normal activity. The pain is present in the substernal region. The pain is at a severity of 4/10. Associated symptoms include nausea and shortness of breath. Pertinent negatives include no abdominal pain, no back pain, no cough, no diaphoresis, no dizziness, no fever, no headaches, no irregular heartbeat, no malaise/fatigue, no near-syncope, no palpitations, no vomiting and no weakness. He has tried nitroglycerin for the symptoms. The treatment provided no relief. Risk factors include cardiac disease, hypertension, male gender, obesity, diabetes mellitus and dyslipidemia (see nurse notes for others). Procedural history includes cardiac stents.       Past Medical History   Diagnosis Date   ??? CAD (coronary artery disease)      see problem list   ??? HTN (hypertension)    ??? PVD (peripheral vascular disease)    ??? DM (diabetes mellitus)    ??? Hyperlipidemia    ??? Sleep apnea      on CPAP   ??? COPD (chronic obstructive pulmonary disease)      mild COPD, recurrent pneumonia, chronic bronchitis (followed by Dr. Leroy Libman)   ??? CKD (chronic kidney disease)      Cr 1.7 in 10/09   ??? CHF (congestive heart failure)    ??? Anemia    ??? Pulmonary hypertension      PASP 60 on echo 06/22/09   ??? Thromboembolus      leg    ??? Thyroid disease    ??? Pneumonia    ??? GERD (gastroesophageal reflux disease)    ??? Chronic pain      back pain   ??? Cataract    ??? GI bleed      multiple bleeding episodes and blood transfusions.  Dr. Monika Salk has followed..  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        Past Surgical History   Procedure Date   ??? Hx orthopaedic      knee replacement   ??? Pr vascular surgery procedure unlist      arterial bypass   ??? Hx pacemaker     ??? Pr cardiac surg procedure unlist      cardiac stent   ??? Hx cataract removal      removed rt eye         Family History   Problem Relation Age of Onset   ??? Hypertension     ??? Cancer Mother      cervical and colon   ??? Cancer Father      lung        History     Social History   ??? Marital Status: Single     Spouse Name: N/A     Number of Children: N/A   ??? Years of Education: N/A     Occupational History   ??? Not on file.     Social History Main Topics   ??? Smoking status: Former Smoker -- 20 years     Types: Cigarettes     Quit date: 11/09/1978   ??? Smokeless tobacco: Never Used   ??? Alcohol Use: No   ??? Drug Use: No   ??? Sexually Active: Not on file  Other Topics Concern   ??? Not on file     Social History Narrative   ??? No narrative on file                  ALLERGIES: Review of patient's allergies indicates no known allergies.      Review of Systems   Constitutional: Negative for fever, malaise/fatigue and diaphoresis.   Respiratory: Positive for shortness of breath. Negative for cough.    Cardiovascular: Positive for chest pain. Negative for palpitations and near-syncope.   Gastrointestinal: Positive for nausea and diarrhea. Negative for vomiting, abdominal pain and abdominal distention.   Genitourinary: Positive for frequency. Negative for dysuria, flank pain, decreased urine volume, difficulty urinating and penile pain.        After taking his diuretic about 1 hour ago   Musculoskeletal: Negative for back pain.   Neurological: Negative for dizziness, seizures, speech difficulty, weakness, light-headedness and headaches.   All other systems reviewed and are negative.        Filed Vitals:    05/16/11 1605   BP: 162/57   Pulse: 76   Temp: 98.2 ??F (36.8 ??C)   Resp: 18   Height: 5\' 6"  (1.676 m)   Weight: 86.183 kg (190 lb)   SpO2: 95%            Physical Exam   Nursing note and vitals reviewed.  Constitutional: He is oriented to person, place, and time. He appears well-developed and well-nourished. He appears distressed.    HENT:   Head: Normocephalic and atraumatic.   Mouth/Throat: Oropharynx is clear and moist.   Eyes: Conjunctivae and EOM are normal. Pupils are equal, round, and reactive to light.   Neck: Normal range of motion. No JVD present.   Cardiovascular: Normal rate, regular rhythm, normal heart sounds and intact distal pulses.    No murmur heard.  Pulmonary/Chest: No stridor. He has no wheezes. He has no rales.        +rhonchi   Abdominal: Soft. Bowel sounds are normal. There is no tenderness.   Musculoskeletal: Normal range of motion. He exhibits no edema and no tenderness.   Neurological: He is alert and oriented to person, place, and time. No cranial nerve deficit.        Ambulating without assistance or change in condition.   Skin: Skin is warm and dry. He is not diaphoretic.   Psychiatric: He has a normal mood and affect.        MDM     Differential Diagnosis; Clinical Impression; Plan:     Some nausea and loose stools for a couple days now - check labs, CXR, EKG and re-eval.  Possible ACS vs CHF vs GI illness triggering symptoms.    Elevated BG with elevated AG - start insulin drip    Initial troponin elevated will consult cardiology.   Patient pain almost gone with aspirin will place 1/2 inch NTG paste on patient  Amount and/or Complexity of Data Reviewed:   Clinical lab tests:  Ordered and reviewed  Tests in the radiology section of CPT??:  Ordered and reviewed   Discuss the patient with another provider:  Yes      Procedures    EKG: normal sinus rhythm, rate 93, +LAE with intraventricular block, hx of pacemaker, but no spikes seen on this EKG, ST changes in I, aVL, lead V6, q-wave in lead III, 04/2011 @ 16:13, previous EKG showed a paced rhythm on 10/17/2009 @ 04:06.    **  Final Report**      ICD Codes / Adm.Diagnosis: 960454 414.00 / Chest Pain   Examination: CR CHEST PORT - [id number] - May 16 2011 4:30PM  Accession No: [id number]  Reason: chest pain      REPORT:  INDICATION: dyspnea     Portable AP upright view of the chest.    Direct comparison made to prior chest x-ray dated 05/16/2011.    Cardiomediastinal silhouette is stable in size. There is pulmonary vascular   prominence and bilateral increased interstitial markings. There is no focal   airspace process. No pleural fluid is visualized. Intact pacer leads   overlie the right atrium and right ventricle.      IMPRESSION: Moderate pulmonary vascular congestive changes.      Signing/Reading Doctor: TODD B. BAIRD [phone])   Approved: TODD B. BAIRD [id number]) May 16 2011 4:40PM

## 2011-05-16 NOTE — H&P (Signed)
[facility]  [address] [place], [place]  [address]  [phone]    Admission History and Physical      NAME:  Travis [name]   DOB:   07/06/1940   MRN:  [id number]     PCP:  Sol Blazing, MD     Date/Time:  05/16/2011         Subjective:     CHIEF COMPLAINT: chest pain     HISTORY OF PRESENT ILLNESS:     The patient is a 70 yo hx of CAD s/p stents, DM, CKD 3, diastolic CHF, presented w/ NSTEMI, DKA.  The patient stated that he has not been "feeling well" today and has not taken his insulin.  He also c/o substernal chest pain, dull, 5/10, associated with some SOB.  He denied cough, fevers, chills, nausea, vomiting, diarrhea.  In the ED, Trop was 0.75, glucose was 600 with an AG of 22.  He was given ASA, nitro, and started on an insulin gtt.      No Known Allergies    Prior to Admission medications    Medication Sig Start Date End Date Taking? Authorizing Provider   potassium chloride (K-DUR, KLOR-CON) 10 mEq tablet Take 1 Tab by mouth daily. 02/05/11  Yes Laddie Aquas, MD   insulin aspart (NOVOLOG) 100 unit/mL injection by SubCUTAneous route Before breakfast and dinner.     Yes Historical Provider   insulin glargine (LANTUS) 100 unit/mL injection by SubCUTAneous route once.     Yes Historical Provider   bumetanide (BUMEX) 2 mg tablet Take 2 mg by mouth daily.   Yes Historical Provider   misoprostol (CYTOTEC) 200 mcg tablet Take 1 Tab by mouth two (2) times a day. 01/26/10  Yes Laddie Aquas, MD   omeprazole (PRILOSEC) 20 mg capsule Take 20 mg by mouth daily.   Yes Historical Provider   simvastatin (ZOCOR) 40 mg tablet Take 20 mg by mouth nightly.   Yes Historical Provider   metoprolol (LOPRESSOR) 50 mg tablet Take 1 Tab by mouth two (2) times a day. 11/13/09  Yes Laddie Aquas, MD   lisinopril (PRINIVIL, ZESTRIL) 20 mg tablet Take 1 Tab by mouth nightly. 11/13/09  Yes Laddie Aquas, MD   terazosin (HYTRIN) 5 mg capsule Take 10 mg by mouth nightly.   Yes Historical Provider    aspirin 81 mg tablet Take  by mouth daily.   Yes Historical Provider   omega-3 fatty acids-vitamin e (FISH OIL) 1,000 mg Cap Take  by mouth two (2) times a day.   Yes Historical Provider   levothyroxine (SYNTHROID) 150 mcg tablet Take  by mouth daily (before breakfast).   Yes Historical Provider   nitroglycerin (NITROSTAT) 0.4 mg SL tablet by SubLINGual route every five (5) minutes as needed.   Yes Historical Provider   ranitidine hcl (ZANTAC) 150 mg capsule Take 150 mg by mouth two (2) times a day.   Yes Historical Provider   albuterol (PROVENTIL HFA, VENTOLIN HFA) 90 mcg/actuation inhaler Take 2 Puffs by inhalation every six (6) hours as needed for Wheezing. 03/11/11   Laddie Aquas, MD       Past Medical History   Diagnosis Date   ??? CAD (coronary artery disease)      see problem list   ??? HTN (hypertension)    ??? PVD (peripheral vascular disease)    ??? DM (diabetes mellitus)    ??? Hyperlipidemia    ??? Sleep apnea  on CPAP   ??? COPD (chronic obstructive pulmonary disease)      mild COPD, recurrent pneumonia, chronic bronchitis (followed by Dr. Leroy Libman)   ??? CKD (chronic kidney disease)      Cr 1.7 in 10/09   ??? CHF (congestive heart failure)    ??? Anemia    ??? Pulmonary hypertension      PASP 60 on echo 06/22/09   ??? Thromboembolus      leg    ??? Thyroid disease    ??? Pneumonia    ??? GERD (gastroesophageal reflux disease)    ??? Chronic pain      back pain   ??? Cataract    ??? GI bleed      multiple bleeding episodes and blood transfusions.  Dr. Monika Salk has followed..  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        Past Surgical History   Procedure Date   ??? Hx orthopaedic      knee replacement   ??? Pr vascular surgery procedure unlist      arterial bypass   ??? Hx pacemaker    ??? Pr cardiac surg procedure unlist      cardiac stent   ??? Hx cataract removal      removed rt eye       History   Substance Use Topics   ??? Smoking status: Former Smoker -- 20 years     Types: Cigarettes     Quit date: 11/09/1978   ??? Smokeless tobacco: Never Used    ??? Alcohol Use: No        Family History   Problem Relation Age of Onset   ??? Hypertension     ??? Cancer Mother      cervical and colon   ??? Cancer Father      lung        Review of Systems:  (bold if positive, if negative)    Gen:  Eyes:  ENT:  CVS:  chest pain,Pulm:  GI:    GU:    MS:  Skin:  Psych:  Endo:    Hem:  Renal:    Neuro:          Objective:      VITALS:    Vital signs reviewed; most recent are:    Visit Vitals   Item Reading   ??? BP 154/59   ??? Pulse 86   ??? Temp 98.2 ??F (36.8 ??C)   ??? Resp 20   ??? Ht 5\' 6"  (1.676 m)   ??? Wt 190 lb   ??? BMI 30.67 kg/m2   ??? SpO2 98%     SpO2 Readings from Last 6 Encounters:   05/16/11 98%   11/13/09 98%   11/13/09 98%   11/13/09 98%   10/17/09 100%   10/15/09 98%    O2 Flow Rate (L/min): 2 l/min   No intake or output data in the 24 hours ending 05/16/11 1833     Exam:     Physical Exam:    Gen:  Well-developed, well-nourished, in no acute distress  HEENT:  Pink conjunctivae, PERRL, hearing intact to voice, moist mucous membranes  Neck:  Supple, without masses, thyroid non-tender  Resp:  No accessory muscle use, trace crackles at bases  Card:  No murmurs, normal S1, S2 without thrills, 1+ edema  Abd:  Soft, non-tender, non-distended, normoactive bowel sounds are present, no palpable organomegaly and no detectable hernias  Lymph:  No cervical adenopathy  Musc:  No cyanosis or clubbing  Skin:  No rashes or ulcers, skin turgor is good  Neuro:  Cranial nerves 3-12 are grossly intact, follows commands appropriately  Psych:  Alert with good insight.  Oriented to person, place, and time      Labs:    Recent Labs   [facility] 05/16/11 1630    WBC 6.5    HGB 11.1*    HCT 33.1*    PLT 127*     Recent Labs   [facility] 05/16/11 1630    NA --    K --    CL --    CO2 --    GLU --    BUN --    CREA --    CA --    MG 2.1    PHOS --    ALB 3.7    TBIL 0.6    SGOT 31     Lab Results   Component Value Date/Time    POC GLUCOSE >600 05/16/2011  5:52 PM    POC GLUCOSE 234 11/13/2009  3:27 PM      No results found for this basename: PH:1,PCO2:1,PO2:1,HCO3:1,FIO2:1 in the last 72 hours  No results found for this basename: INR:1 in the last 72 hours    Chest Xray and EKG reviewed:   Chest X-ray: Positive findings of: Pulmonary edema.Results reviewed with Radiologist.       Assessment/Plan:       Active Problems:    70 yo hx of CAD s/p stents, DM, CKD 3, diastolic CHF, presented w/ NSTEMI, DKA    1) NSTEMI (non-ST elevated myocardial infarction)/Chest pain, unspecified: elevated trop on admission.  Will admit to IVCU.  Start O2, morphine, ASA, nitro, BB.  No anticoagulation given hx of GI bleed.  Follows cardiac enzymes.  Cards following    2) DKA, type 2, not at goal: due to medical non-compliance.  Has been followed by Dr. Angelina Ok, endocrine, but has not seen her in a while.  Will start insulin gtt.  Monitor BMP closely.  Consult DTC and endocrine in AM    3) Acute renal failure (ARF)/CKD 3: likely due dehydration.  Cont gently IVF.  Must balance between volume depletion due to DKA and CHF.  Patient is followed by Dr. Sherrine Maples, will consult    4) Acute on chronic diastolic congestive heart failure: Last echo in 2011 with EF of 60%.  BNP 2,000 on admission w/ pulm edema on CXR.  Given renal function, will give IV diuretics prn.  Cont home bumex, lisinopril.  Hold both if renal function is worse    5) Unspecified hypothyroidism: cont synthroid     Full Code    Risk of deterioration: high      Total time spent with patient: 48 Minutes (critical care)                 Care Plan discussed with: Patient    Discussed:  Care Plan    Prophylaxis:  Hep SQ    Probable Disposition:  Home w/Family           ___________________________________________________    Attending Physician: Twanna Hy. Shermeka Rutt, MD

## 2011-05-16 NOTE — ED Notes (Signed)
Patient advised of wait for admission.  Denies complaint at this time.

## 2011-05-16 NOTE — Progress Notes (Addendum)
2000-SBAR report received from ER nurse Fleet Contras, RN.    2025-Patient received and assessed, patient denies any chest pain at this time or shortness of breath, NSR, VSS, on 2 L NC, has left 20g AC c/d/i, clear lung sounds, s1s2 no murmurs, using urinal, has wound on left great toe that is bandaged up with gauze and paper tape, patient stated he had his toe nail removed last Wednesday, stated has been having a few calf cramps but refused any pain meds at this time.    2120-Labs drawn for CMP stat per Dr. Dagoberto Reef, also requested call back when cardiac enzymes done.    2323-Paged Dr. Dagoberto Reef, advised of troponin level 14.97, requested to start heparin drip IV, order repeated and entered.    0030-Patient c/o leg cramps BLE, comes and goes, morphine given.  Patient denies any chest pain/discomfort.    0340- BG 68, D50 given, insulin gtt turned off, will recheck.    0403-BG 120, insulin gtt restarted per glucostabilizer.    0432-Patient c/o leg cramps again, morphine given.  Will continue to monitor patient.    0628-Patient unsure of his home meds, patient stated he takes about 20 pills, his daughter will be in today and she takes care of his meds for him.    0736-Bedside and Verbal shift change report given to Marylu Lund, Charity fundraiser (oncoming nurse) by Nanine Means, RN (offgoing nurse).  Report given with SBAR, Kardex, OR Summary, Procedure Summary, Intake/Output, MAR and Recent Results.

## 2011-05-16 NOTE — ED Notes (Signed)
Attempted to call report x 1 - nurse will call back.

## 2011-05-16 NOTE — ED Notes (Signed)
Pt resting with daughter at bedside. Awaiting hospitalist for admission.

## 2011-05-16 NOTE — ED Notes (Signed)
Pt states he had same chest pain on Monday but it went away on it's own. Pt states once the pain happened again today he thought he should be checked out. Pt denies any chest pain currently.

## 2011-05-16 NOTE — Consults (Signed)
See admission note

## 2011-05-16 NOTE — Telephone Encounter (Signed)
dtr called me & said that her dad called her and was no feeling well. Said that he felt today, exactly how he did this time last year. He took 1 NTG sl with a little relief and he wants to go to the ER. I told her that was fine.

## 2011-05-16 NOTE — Progress Notes (Signed)
Received consult. Discussed with ER attending.  Low level troponin positive with known history of angina in the setting of renal insufficiency and super high glucose.  Known history of GI bleeding in past. Refraining from anticoag unless the Trop is significantly high  Lasix 40mg  now.   Full consult to follow.   Makeba Delcastillo K. Dagoberto Reef, MD, [facility] ([facility])

## 2011-05-16 NOTE — ED Notes (Signed)
Pt. Reports chest pain that began today.

## 2011-05-16 NOTE — ED Notes (Signed)
Pt. Declines to be triaged at this time and is ambulatory to the bathroom.

## 2011-05-17 LAB — TROPONIN I
Troponin-I, Qt.: 14.97 ng/mL — ABNORMAL HIGH (ref <0.05)
Troponin-I, Qt.: 15.78 ng/mL — ABNORMAL HIGH (ref <0.05)
Troponin-I, Qt.: 10.75 ng/mL — ABNORMAL HIGH (ref <0.05)

## 2011-05-17 LAB — GLUCOSE, POC
Glucose (POC): 120 mg/dL — ABNORMAL HIGH (ref 75–110)
Glucose (POC): 136 mg/dL — ABNORMAL HIGH (ref 75–110)
Glucose (POC): 140 mg/dL — ABNORMAL HIGH (ref 75–110)
Glucose (POC): 215 mg/dL — ABNORMAL HIGH (ref 75–110)
Glucose (POC): 238 mg/dL — ABNORMAL HIGH (ref 75–110)
Glucose (POC): 292 mg/dL — ABNORMAL HIGH (ref 75–110)
Glucose (POC): 368 mg/dL — ABNORMAL HIGH (ref 75–110)
Glucose (POC): 425 mg/dL — ABNORMAL HIGH (ref 75–110)
Glucose (POC): 449 mg/dL — ABNORMAL HIGH (ref 75–110)
Glucose (POC): 462 mg/dL — ABNORMAL HIGH (ref 75–110)
Glucose (POC): 536 mg/dL — ABNORMAL HIGH (ref 75–110)
Glucose (POC): 68 mg/dL — ABNORMAL LOW (ref 75–110)
Glucose (POC): 82 mg/dL (ref 75–110)
Glucose (POC): 93 mg/dL (ref 75–110)
Glucose (POC): 94 mg/dL (ref 75–110)
Glucose (POC): 94 mg/dL (ref 75–110)

## 2011-05-17 LAB — METABOLIC PANEL, BASIC
Anion gap: 9 mmol/L (ref 5–15)
BUN/Creatinine ratio: 15 (ref 12–20)
BUN: 45 MG/DL — ABNORMAL HIGH (ref 6–20)
CO2: 27 MMOL/L (ref 21–32)
Calcium: 8.4 MG/DL — ABNORMAL LOW (ref 8.5–10.1)
Chloride: 101 MMOL/L (ref 97–108)
Creatinine: 2.98 MG/DL — ABNORMAL HIGH (ref 0.45–1.15)
GFR est AA: 25 mL/min/{1.73_m2} — ABNORMAL LOW (ref >60)
GFR est non-AA: 21 mL/min/{1.73_m2} — ABNORMAL LOW (ref >60)
Glucose: 84 MG/DL (ref 65–100)
Potassium: 4 MMOL/L (ref 3.5–5.1)
Sodium: 137 MMOL/L (ref 136–145)

## 2011-05-17 LAB — METABOLIC PANEL, COMPREHENSIVE
A-G Ratio: 0.7 — ABNORMAL LOW (ref 1.1–2.2)
ALT (SGPT): 21 U/L (ref 12–78)
AST (SGOT): 47 U/L — ABNORMAL HIGH (ref 15–37)
Albumin: 3.4 g/dL — ABNORMAL LOW (ref 3.5–5.0)
Alk. phosphatase: 101 U/L (ref 50–136)
Anion gap: 13 mmol/L (ref 5–15)
BUN/Creatinine ratio: 16 (ref 12–20)
BUN: 45 MG/DL — ABNORMAL HIGH (ref 6–20)
Bilirubin, total: 0.4 MG/DL (ref 0.2–1.0)
CO2: 25 MMOL/L (ref 21–32)
Calcium: 8.7 MG/DL (ref 8.5–10.1)
Chloride: 97 MMOL/L (ref 97–108)
Creatinine: 2.73 MG/DL — ABNORMAL HIGH (ref 0.45–1.15)
GFR est AA: 28 mL/min/{1.73_m2} — ABNORMAL LOW (ref >60)
GFR est non-AA: 23 mL/min/{1.73_m2} — ABNORMAL LOW (ref >60)
Globulin: 4.7 g/dL — ABNORMAL HIGH (ref 2.0–4.0)
Glucose: 327 MG/DL — ABNORMAL HIGH (ref 65–100)
Potassium: 4.1 MMOL/L (ref 3.5–5.1)
Protein, total: 8.1 g/dL (ref 6.4–8.2)
Sodium: 135 MMOL/L — ABNORMAL LOW (ref 136–145)

## 2011-05-17 LAB — CK W/ CKMB & INDEX
CK - MB: 32.8 NG/ML — ABNORMAL HIGH (ref 0.5–3.6)
CK - MB: 42.8 NG/ML — ABNORMAL HIGH (ref 0.5–3.6)
CK - MB: 44.8 NG/ML — ABNORMAL HIGH (ref 0.5–3.6)
CK - MB: 45.6 NG/ML — ABNORMAL HIGH (ref 0.5–3.6)
CK-MB Index: 10.3 — ABNORMAL HIGH (ref 0–2.5)
CK-MB Index: 10.5 — ABNORMAL HIGH (ref 0–2.5)
CK-MB Index: 11.1 — ABNORMAL HIGH (ref 0–2.5)
CK-MB Index: 9.8 — ABNORMAL HIGH (ref 0–2.5)
CK: 313 U/L — ABNORMAL HIGH (ref 39–308)
CK: 410 U/L — ABNORMAL HIGH (ref 39–308)
CK: 434 U/L — ABNORMAL HIGH (ref 39–308)
CK: 435 U/L — ABNORMAL HIGH (ref 39–308)

## 2011-05-17 LAB — HEMOGLOBIN A1C WITH EAG
Est. average glucose: 255 mg/dL
Hemoglobin A1c: 10.5 % — ABNORMAL HIGH (ref 4.2–6.3)

## 2011-05-17 LAB — HEPATIC FUNCTION PANEL
A-G Ratio: 0.7 — ABNORMAL LOW (ref 1.1–2.2)
ALT (SGPT): 21 U/L (ref 12–78)
AST (SGOT): 54 U/L — ABNORMAL HIGH (ref 15–37)
Albumin: 3.1 g/dL — ABNORMAL LOW (ref 3.5–5.0)
Alk. phosphatase: 92 U/L (ref 50–136)
Bilirubin, direct: 0.1 MG/DL (ref 0.0–0.2)
Bilirubin, total: 0.4 MG/DL (ref 0.2–1.0)
Globulin: 4.3 g/dL — ABNORMAL HIGH (ref 2.0–4.0)
Protein, total: 7.4 g/dL (ref 6.4–8.2)

## 2011-05-17 LAB — PTT
aPTT: 28.7 s (ref 24.0–31.5)
aPTT: 90.2 s — ABNORMAL HIGH (ref 24.0–31.5)

## 2011-05-17 LAB — CBC W/O DIFF
HCT: 27.9 % — ABNORMAL LOW (ref 36.6–50.3)
HGB: 9.4 g/dL — ABNORMAL LOW (ref 12.1–17.0)
MCH: 29.7 PG (ref 26.0–34.0)
MCHC: 33.7 g/dL (ref 30.0–36.5)
MCV: 88 FL (ref 80.0–99.0)
PLATELET: 121 10*3/uL — ABNORMAL LOW (ref 150–400)
RBC: 3.17 M/uL — ABNORMAL LOW (ref 4.10–5.70)
RDW: 14.8 % — ABNORMAL HIGH (ref 11.5–14.5)
WBC: 6.1 10*3/uL (ref 4.1–11.1)

## 2011-05-17 LAB — POC CHEM8
Anion gap (POC): 22 mmol/L — ABNORMAL HIGH (ref 5–15)
BUN (POC): 38 MG/DL — ABNORMAL HIGH (ref 9–20)
CO2 (POC): 18 MMOL/L — ABNORMAL LOW (ref 21–32)
Calcium, ionized (POC): 1.09 MMOL/L — ABNORMAL LOW (ref 1.12–1.32)
Chloride (POC): 101 MMOL/L (ref 98–107)
Creatinine (POC): 2.2 MG/DL — ABNORMAL HIGH (ref 0.6–1.3)
GFRAA, POC: 36 mL/min/{1.73_m2} — ABNORMAL LOW (ref >60)
GFRNA, POC: 30 mL/min/{1.73_m2} — ABNORMAL LOW (ref >60)
Glucose (POC): 637 MG/DL — CR (ref 75–110)
Hematocrit (POC): 35 % — ABNORMAL LOW (ref 36.6–50.3)
Hemoglobin (POC): 11.9 GM/DL — ABNORMAL LOW (ref 12.1–17.0)
Potassium (POC): 5.2 MMOL/L — ABNORMAL HIGH (ref 3.5–5.1)
Sodium (POC): 135 MMOL/L — ABNORMAL LOW (ref 136–145)

## 2011-05-17 LAB — LIPID PANEL
CHOL/HDL Ratio: 2.2 (ref 0–5.0)
Cholesterol, total: 123 MG/DL (ref <200)
HDL Cholesterol: 55 MG/DL
LDL, calculated: 58.4 MG/DL (ref 0–100)
Triglyceride: 48 MG/DL (ref <150)
VLDL, calculated: 9.6 MG/DL

## 2011-05-17 LAB — MAGNESIUM: Magnesium: 2.1 MG/DL (ref 1.6–2.4)

## 2011-05-17 LAB — PROTHROMBIN TIME + INR
INR: 1.2 — ABNORMAL HIGH (ref 0.9–1.1)
Prothrombin time: 13 s — ABNORMAL HIGH (ref 9.4–11.7)

## 2011-05-17 LAB — PHOSPHORUS: Phosphorus: 1.8 MG/DL — ABNORMAL LOW (ref 2.5–4.9)

## 2011-05-17 MED ORDER — HEPARIN (PORCINE) IN D5W 25,000 UNIT/250 ML IV
25000 unit/250 mL(100 unit/mL) | INTRAVENOUS | Status: DC
Start: 2011-05-17 — End: 2011-05-20
  Administered 2011-05-17 – 2011-05-20 (×12): via INTRAVENOUS

## 2011-05-17 MED ORDER — PROMETHAZINE IN NS 12.5 MG/50 ML IV PIGGY BAG
12.5 mg/50 ml | Freq: Three times a day (TID) | INTRAVENOUS | Status: DC | PRN
Start: 2011-05-17 — End: 2011-05-25

## 2011-05-17 MED ORDER — FAMOTIDINE 20 MG TAB
20 mg | Freq: Two times a day (BID) | ORAL | Status: DC
Start: 2011-05-17 — End: 2011-05-25
  Administered 2011-05-17 – 2011-05-25 (×17): via ORAL

## 2011-05-17 MED ORDER — METOPROLOL TARTRATE 50 MG TAB
50 mg | Freq: Two times a day (BID) | ORAL | Status: DC
Start: 2011-05-17 — End: 2011-05-19
  Administered 2011-05-17 – 2011-05-20 (×7): via ORAL

## 2011-05-17 MED ORDER — BISACODYL 10 MG RECTAL SUPPOSITORY
10 mg | Freq: Every day | RECTAL | Status: DC | PRN
Start: 2011-05-17 — End: 2011-05-25

## 2011-05-17 MED ORDER — DEXTROSE 50% IN WATER (D50W) IV SYRG
INTRAVENOUS | Status: DC | PRN
Start: 2011-05-17 — End: 2011-05-17

## 2011-05-17 MED ORDER — LISINOPRIL 20 MG TAB
20 mg | Freq: Every evening | ORAL | Status: DC
Start: 2011-05-17 — End: 2011-05-18
  Administered 2011-05-17 – 2011-05-18 (×2): via ORAL

## 2011-05-17 MED ORDER — PANTOTHENIC AC-MIN OIL-PET,HYD OINTMENT
41 % | Freq: Three times a day (TID) | CUTANEOUS | Status: DC
Start: 2011-05-17 — End: 2011-05-24
  Administered 2011-05-17 – 2011-05-24 (×19): via TOPICAL

## 2011-05-17 MED ORDER — DEXTROSE 50% IN WATER (D50W) IV SYRG
INTRAVENOUS | Status: DC | PRN
Start: 2011-05-17 — End: 2011-05-25
  Administered 2011-05-17: 09:00:00 via INTRAVENOUS

## 2011-05-17 MED ORDER — MAGNESIUM HYDROXIDE 400 MG/5 ML ORAL SUSP
400 mg/5 mL | Freq: Every day | ORAL | Status: DC | PRN
Start: 2011-05-17 — End: 2011-05-25

## 2011-05-17 MED ORDER — GLUCAGON 1 MG INJECTION
1 mg | INTRAMUSCULAR | Status: DC | PRN
Start: 2011-05-17 — End: 2011-05-17

## 2011-05-17 MED ORDER — LEVOTHYROXINE 150 MCG TAB
150 mcg | Freq: Every day | ORAL | Status: DC
Start: 2011-05-17 — End: 2011-05-25
  Administered 2011-05-17 – 2011-05-25 (×9): via ORAL

## 2011-05-17 MED ORDER — INSULIN LISPRO 100 UNIT/ML INJECTION
100 unit/mL | Freq: Three times a day (TID) | SUBCUTANEOUS | Status: DC
Start: 2011-05-17 — End: 2011-05-18
  Administered 2011-05-17 – 2011-05-18 (×2): via SUBCUTANEOUS

## 2011-05-17 MED ORDER — GLUCOSE 4 GRAM CHEWABLE TAB
4 gram | ORAL | Status: DC | PRN
Start: 2011-05-17 — End: 2011-05-25

## 2011-05-17 MED ORDER — NALOXONE 0.4 MG/ML INJECTION
0.4 mg/mL | INTRAMUSCULAR | Status: DC | PRN
Start: 2011-05-17 — End: 2011-05-25

## 2011-05-17 MED ORDER — INSULIN LISPRO 100 UNIT/ML INJECTION
100 unit/mL | Freq: Four times a day (QID) | SUBCUTANEOUS | Status: DC
Start: 2011-05-17 — End: 2011-05-17
  Administered 2011-05-17: 17:00:00 via SUBCUTANEOUS

## 2011-05-17 MED ORDER — GLUCAGON 1 MG INJECTION
1 mg | INTRAMUSCULAR | Status: DC | PRN
Start: 2011-05-17 — End: 2011-05-25

## 2011-05-17 MED ORDER — ACETAMINOPHEN 325 MG TABLET
325 mg | Freq: Four times a day (QID) | ORAL | Status: DC | PRN
Start: 2011-05-17 — End: 2011-05-22

## 2011-05-17 MED ORDER — INSULIN GLARGINE 100 UNIT/ML INJECTION
100 unit/mL | Freq: Every day | SUBCUTANEOUS | Status: DC
Start: 2011-05-17 — End: 2011-05-25
  Administered 2011-05-18 – 2011-05-24 (×7): via SUBCUTANEOUS

## 2011-05-17 MED ORDER — INSULIN REGULAR HUMAN 100 UNIT/ML INJECTION
100 unit/mL | Freq: Three times a day (TID) | INTRAMUSCULAR | Status: DC
Start: 2011-05-17 — End: 2011-05-17

## 2011-05-17 MED ORDER — HEPARIN (PORCINE) 5,000 UNIT/ML IJ SOLN
5000 unit/mL | Freq: Two times a day (BID) | INTRAMUSCULAR | Status: DC
Start: 2011-05-17 — End: 2011-05-16
  Administered 2011-05-17: 02:00:00 via SUBCUTANEOUS

## 2011-05-17 MED ORDER — BUMETANIDE 1 MG TAB
1 mg | Freq: Every day | ORAL | Status: DC
Start: 2011-05-17 — End: 2011-05-18
  Administered 2011-05-17: 14:00:00 via ORAL

## 2011-05-17 MED ORDER — DOCUSATE SODIUM 100 MG CAP
100 mg | Freq: Two times a day (BID) | ORAL | Status: DC
Start: 2011-05-17 — End: 2011-05-25
  Administered 2011-05-17 – 2011-05-25 (×21): via ORAL

## 2011-05-17 MED ORDER — .PHARMACY TO SUBSTITUTE PER PROTOCOL
Status: DC
Start: 2011-05-17 — End: 2011-05-16

## 2011-05-17 MED ORDER — MORPHINE 2 MG/ML INJECTION
2 mg/mL | INTRAMUSCULAR | Status: DC | PRN
Start: 2011-05-17 — End: 2011-05-25
  Administered 2011-05-17 – 2011-05-24 (×17): via INTRAVENOUS

## 2011-05-17 MED ORDER — ONDANSETRON (PF) 4 MG/2 ML INJECTION
4 mg/2 mL | INTRAMUSCULAR | Status: DC | PRN
Start: 2011-05-17 — End: 2011-05-25

## 2011-05-17 MED ORDER — SODIUM CHLORIDE 0.9 % IV
INTRAVENOUS | Status: DC
Start: 2011-05-17 — End: 2011-05-20
  Administered 2011-05-17 – 2011-05-19 (×3): via INTRAVENOUS

## 2011-05-17 MED ORDER — SODIUM HYPOCHLORITE (DAKIN'S) 0.125% SOLUTION
0.125 % | Freq: Every day | Status: AC
Start: 2011-05-17 — End: 2011-05-24
  Administered 2011-05-18 – 2011-05-24 (×7): via TOPICAL

## 2011-05-17 MED ORDER — PROCHLORPERAZINE EDISYLATE 5 MG/ML INJECTION
5 mg/mL | Freq: Three times a day (TID) | INTRAMUSCULAR | Status: DC | PRN
Start: 2011-05-17 — End: 2011-05-16

## 2011-05-17 MED ORDER — PANTOPRAZOLE 40 MG TAB, DELAYED RELEASE
40 mg | Freq: Every day | ORAL | Status: DC
Start: 2011-05-17 — End: 2011-05-25
  Administered 2011-05-17 – 2011-05-25 (×9): via ORAL

## 2011-05-17 MED ORDER — INSULIN LISPRO 100 UNIT/ML INJECTION
100 unit/mL | Freq: Four times a day (QID) | SUBCUTANEOUS | Status: DC
Start: 2011-05-17 — End: 2011-05-18
  Administered 2011-05-17 – 2011-05-18 (×2): via SUBCUTANEOUS

## 2011-05-17 MED ORDER — TERAZOSIN 5 MG CAP
5 mg | Freq: Every evening | ORAL | Status: DC
Start: 2011-05-17 — End: 2011-05-25
  Administered 2011-05-17 – 2011-05-25 (×9): via ORAL

## 2011-05-17 MED ORDER — ASPIRIN 81 MG CHEWABLE TAB
81 mg | Freq: Every day | ORAL | Status: DC
Start: 2011-05-17 — End: 2011-05-25
  Administered 2011-05-17 – 2011-05-25 (×9): via ORAL

## 2011-05-17 MED ORDER — SALINE PERIPHERAL FLUSH Q8H
Freq: Three times a day (TID) | INTRAMUSCULAR | Status: DC
Start: 2011-05-17 — End: 2011-05-25
  Administered 2011-05-17 – 2011-05-25 (×28)

## 2011-05-17 MED ORDER — INSULIN REGULAR HUMAN 100 UNIT/ML INJECTION
100 unit/mL | INTRAMUSCULAR | Status: DC
Start: 2011-05-17 — End: 2011-05-17
  Administered 2011-05-17 (×11): via INTRAVENOUS

## 2011-05-17 MED ORDER — MISOPROSTOL 200 MCG TAB
200 mcg | Freq: Two times a day (BID) | ORAL | Status: DC
Start: 2011-05-17 — End: 2011-05-25
  Administered 2011-05-17 – 2011-05-25 (×19): via ORAL

## 2011-05-17 MED ORDER — DIPHENHYDRAMINE 25 MG CAP
25 mg | ORAL | Status: DC | PRN
Start: 2011-05-17 — End: 2011-05-25

## 2011-05-17 MED ORDER — INSULIN GLARGINE 100 UNIT/ML INJECTION
100 unit/mL | Freq: Every day | SUBCUTANEOUS | Status: DC
Start: 2011-05-17 — End: 2011-05-17
  Administered 2011-05-17: 15:00:00 via SUBCUTANEOUS

## 2011-05-17 MED ORDER — OXYCODONE-ACETAMINOPHEN 5 MG-325 MG TAB
5-325 mg | Freq: Four times a day (QID) | ORAL | Status: DC | PRN
Start: 2011-05-17 — End: 2011-05-25
  Administered 2011-05-17 – 2011-05-21 (×2): via ORAL

## 2011-05-17 MED ORDER — GLUCOSE 4 GRAM CHEWABLE TAB
4 gram | ORAL | Status: DC | PRN
Start: 2011-05-17 — End: 2011-05-17

## 2011-05-17 MED ORDER — SIMVASTATIN 20 MG TAB
20 mg | Freq: Every evening | ORAL | Status: DC
Start: 2011-05-17 — End: 2011-05-25
  Administered 2011-05-17 – 2011-05-25 (×9): via ORAL

## 2011-05-17 MED ORDER — ACETAMINOPHEN 650 MG RECTAL SUPPOSITORY
650 mg | RECTAL | Status: DC | PRN
Start: 2011-05-17 — End: 2011-05-22

## 2011-05-17 MED ORDER — SALINE PERIPHERAL FLUSH PRN
INTRAMUSCULAR | Status: DC | PRN
Start: 2011-05-17 — End: 2011-05-25

## 2011-05-17 NOTE — Progress Notes (Signed)
05/17/2011 10:11A CM met with pt as an introductory visit. Pt lives alone in [place].He has one son who lives near him and another who lives in [place].Pt has DM.He states saw a Dr. Inge Rise who extracted his toenail.Pt typically follows up with the "[facility] " [facility] as an outpt.Pt is independent in all areas and is not home bound.From a discharge perspective,there are no current identified home health needs.Marliss Czar

## 2011-05-17 NOTE — Progress Notes (Signed)
Pharmacist Medication Reconciliation Summary    Medication reconciliation completed at bedside with Travis [name].  Travis [name] reports that his daughter assists him with medications at home by setting up a pill organizer.  He did admit to forgetting to take a dose now and then.  I reviewed the importance of adherence with him to avoid illness and hospitalization.  He was also provided with an educational handout on adherence with information on asking questions so that he understands his medical conditions and what he needs to do to take good care of himself.    The PTA list is as follows:   Prescriptions prior to admission   Medication Sig   ??? insulin glargine (LANTUS) 100 unit/mL injection 30 Units by SubCUTAneous route daily.     ??? potassium chloride (K-DUR, KLOR-CON) 10 mEq tablet Take 1 Tab by mouth daily.   ??? insulin aspart (NOVOLOG) 100 unit/mL injection by SubCUTAneous route Before breakfast and dinner.     ??? bumetanide (BUMEX) 2 mg tablet Take 2 mg by mouth daily.   ??? misoprostol (CYTOTEC) 200 mcg tablet Take 1 Tab by mouth two (2) times a day.   ??? omeprazole (PRILOSEC) 20 mg capsule Take 20 mg by mouth daily.   ??? simvastatin (ZOCOR) 40 mg tablet Take 20 mg by mouth nightly.   ??? metoprolol (LOPRESSOR) 50 mg tablet Take 1 Tab by mouth two (2) times a day.   ??? lisinopril (PRINIVIL, ZESTRIL) 20 mg tablet Take 1 Tab by mouth nightly.   ??? terazosin (HYTRIN) 5 mg capsule Take 10 mg by mouth nightly.   ??? aspirin 81 mg tablet Take  by mouth daily.   ??? omega-3 fatty acids-vitamin e (FISH OIL) 1,000 mg Cap Take  by mouth two (2) times a day.   ??? levothyroxine (SYNTHROID) 150 mcg tablet Take  by mouth daily (before breakfast).   ??? nitroglycerin (NITROSTAT) 0.4 mg SL tablet by SubLINGual route every five (5) minutes as needed.   ??? ranitidine hcl (ZANTAC) 150 mg capsule Take 150 mg by mouth two (2) times a day.    ??? albuterol (PROVENTIL HFA, VENTOLIN HFA) 90 mcg/actuation inhaler Take 2 Puffs by inhalation every six (6) hours as needed for Wheezing.       Recommendations:  Continue to reinforce adherence and the purpose of and when to take his medications      Sign: MARGARET R CAVANAUGH RPh, BCPS

## 2011-05-17 NOTE — Progress Notes (Signed)
Occupational Therapy EVALUATION/discharge  Patient: Travis [name] (70 y.o. male)  Date: 05/17/2011  Primary Diagnosis: NSTEMI (non-ST elevated myocardial infarction)        Precautions:    ASSESSMENT AND RECOMMENDATIONS:  Based on the objective data described below, the patient presents with good overall activity tolerance.  Patient reports he will lives alone however has assistance from son PRN who lives nearby.  Patient independent in functional mobility using no assistive device.  Patient independent in ADLs.  Patient educated on home safety and exercises, he confirmed understanding.  Patient with no further skilled OT needs at this time.      Skilled occupational therapy is not indicated at this time.  Discharge Recommendations: None  Further Equipment Recommendations for Discharge: none       SUBJECTIVE:   Patient stated ???My arms are strong.  My legs give me trouble sometimes.???    OBJECTIVE DATA SUMMARY:     Past Medical History   Diagnosis Date   ??? CAD (coronary artery disease)      see problem list   ??? HTN (hypertension)    ??? PVD (peripheral vascular disease)    ??? DM (diabetes mellitus)    ??? Hyperlipidemia    ??? Sleep apnea      on CPAP   ??? COPD (chronic obstructive pulmonary disease)      mild COPD, recurrent pneumonia, chronic bronchitis (followed by Dr. Leroy Libman)   ??? CKD (chronic kidney disease)      Cr 1.7 in 10/09   ??? CHF (congestive heart failure)    ??? Anemia    ??? Pulmonary hypertension      PASP 60 on echo 06/22/09   ??? Thromboembolus      leg    ??? Thyroid disease    ??? Pneumonia    ??? GERD (gastroesophageal reflux disease)    ??? Chronic pain      back pain   ??? Cataract    ??? GI bleed      multiple bleeding episodes and blood transfusions.  Dr. Monika Salk has followed..  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     Past Surgical History   Procedure Date   ??? Hx orthopaedic      knee replacement   ??? Pr vascular surgery procedure unlist      arterial bypass   ??? Hx pacemaker    ??? Pr cardiac surg procedure unlist      cardiac stent    ??? Hx cataract removal      removed rt eye     Prior Level of Function/Home Situation: Patient lives alone but son lives nearby.  Patient reports independence with ADLs and functional mobility prior to admission.    Home Situation  Home Environment: Private residence  # Steps to Enter: 5   Rails to Enter: Yes  Hand Rails : Bilateral  One/Two Story Residence: Two Nutritional therapist of Interior Steps: 12   Height of Each Step (in): 4 inches  Interior Rails: Left  Lift Chair Available: No  Living Alone: Yes   Support Systems: Child(ren)  Patient Expects to be Discharged to:: Private residence  Current DME Used/Available at Home: Cane, straight  Tub or Shower Type: Tub/Shower combination  [x]      Right hand dominant   []      Left hand dominant  Cognitive/Behavioral Status:  Neurologic State: Alert  Orientation Level: Oriented X4  Cognition: Appropriate decision making;Appropriate for age attention/concentration;Appropriate safety awareness  Safety/Judgement: Awareness of environment;Good  awareness of safety precautions  Skin: Intact in the uppers   Edema: None noted in the uppers  Vision/Perceptual:    Tracking: Able to track stimulus in all quadrants w/o difficulty    Diplopia: No    Acuity: Within Defined Limits    Corrective Lenses: Glasses  Coordination:  Coordination: Within functional limits  Fine Motor Skills-Upper: Left Intact;Right Intact    Gross Motor Skills-Upper: Left Intact;Right Intact  Balance:  Sitting: Intact  Standing: Intact  Strength:  Strength: Within functional limits in the uppers      Tone & Sensation:  Tone: Normal  Sensation: Intact     Range of Motion:  AROM: Within functional limits  PROM: Within functional limits     Functional Mobility and Transfers for ADLs:  Bed Mobility:  Rolling: Independent  Supine to Sit: Independent  Sit to Supine: Independent  Scooting: Independent    Transfers:  Sit to Stand: Independent  Stand Pivot Transfers: Completely independent  Bed to Chair: Independent       ADL Assessment:  Feeding: Independent    Oral Facial Hygiene/Grooming: Independent    Bathing: Independent    Upper Body Dressing: Independent    Lower Body Dressing: Independent    Toileting: Independent     Cognitive Retraining  Safety/Judgement: Awareness of environment;Good awareness of safety precautions    Pain:  Pain Scale 1: Numeric (0 - 10)  Pain Intensity 1: 0  Pain Location 1: Leg  Pain Orientation 1: Lower;Left;Right  Pain Description 1: Dull;Cramping  Pain Intervention(s) 1: Medication (see MAR)  Activity Tolerance:   Patient tolerated eval well.     Please refer to the flowsheet for vital signs taken during this treatment.  After treatment:   [x]   Patient left in no apparent distress sitting up in chair  []   Patient left in no apparent distress in bed  [x]   Call bell left within reach  [x]   Nursing notified  []   Caregiver present  []   Bed alarm activated    COMMUNICATION/EDUCATION:   Communication/Collaboration:  [x]       Home safety education was provided and the patient/caregiver indicated understanding.  [x]       Patient/family have participated as able and agree with findings and recommendations.  []       Patient is unable to participate in plan of care at this time.  Findings and recommendations were discussed with: Physical Therapist, Registered Nurse and patient.    Mary C. Neale Burly, OTR/L  Time Calculation: 18 mins

## 2011-05-17 NOTE — Consults (Addendum)
C/S dictated    CKD 3/4 with Cr above baseline  ARF vs. Progression of CKD      Plan:  Check UA  Avoid nephrotoxins  Monitor serial labs  Further workup if worsens

## 2011-05-17 NOTE — Other (Cosign Needed Addendum)
Cardiac  Quality Team    05/17/2011  05/22/2011 05/22/2011    Reason for discussing patient in Cardiac MDR:    NSTEMI                                                Attendance:    05/17/2011   X NP/MD   X Cardiac rehab   X RN X Care management                                       05/22/2011     Patient Information:  NAME:   Travis [name]  AGE:   70 y.o.      PCP: Sol Blazing, MD [facility]                                                     ADMISSION DATE:   05/16/2011    LOS:    ADMITTING PHYSICIAN:   Salvadore Dom, MD    *CARDIOLOGIST: Dr. Welton Flakes              CODE STATUS:  Full      ADVANCED DIRECTIVES: Alpha Gula      Admit Weight:    Wt Readings from Last 3 Encounters:   05/17/11 185 lb 8 oz (84.142 kg)   03/11/11 195 lb (88.451 kg)   11/14/10 193 lb 6.4 oz (87.726 kg)               05/22/2011 189#  05/24/2011  Not done      ________________________________________________________________________  Travis [name] is a 70 y.o. year old male w/ hx: CAD, multiple stents, HTN, CKD-stage 3, DM, Diasto HF, GI bleed, PAD   Pw: NSTEMI trop 15.78     CARDIAC EVALUATION:    CATH: 2003 and 2006 - chronic LCX occlusion  STRESS: 02/09/07. The type of test was a dobutamine stress perfusion imaging. Cardiac stress testing was negative for chest pain and myocardial ischemia. Myocardial perfusion imaging showed reversible defect(s) involving the inferior wall. Gated EF (%): 55.   ECHO: 01/19/07. Echocardiogram demonstrated normal LV wall motion and ejection fraction, LVH and left atrial enlargement.   CAROTID: 2009 bilateral 50-79% stenosis.   CAROTIDS: 07/08/08 - 50-79% stenosis bilat  ECHO: 04/03/09 - TDS, moderate LVH, LVEF 65%; moderate LAE, RAE, mild AS (mean gradient 10), mild MR  ECHO: 1/11 - mild LVH, LAE, EF 55%, mild Pulm HTN, mild MR  CATH:06/05/09- DES to RCA  CATH: 06/22/09 - LAD 50%, LCX 100%, mid RCA patent stent, distal RCA 40%  ECHO: 06/22/09 - mild LVH, EF 55-60%, mild LAE, mild MR/TR, PASP 60   CATH: 07/18/09 - patent stent and stable findings; EF 60%  CATH: 10/16/09 - 85% ulcerated plaque mid RCA successfully stented with 2.75 Xience. CTO Cx successful PTCA and DES of proximal part with 2.25 Atom.   CATH: 05/20/11: d LM:  30 %. pLAD 30%. dLAD 80%. mLCX 1005 prior to stent. OM1:80%. mRCA:100 %  at the site of a prior stent.- Med tx  ___________________________________________________________________    05/17/2011     BP 118/49   Pulse 68   Temp 98 ??F (36.7 ??C)   Resp 13   Ht 5\' 6"  (1.676 m)   Wt 185 lb 8 oz (84.142 kg)   BMI 29.94 kg/m2   SpO2 98%  05/22/2011  BP 158/67   Pulse 72   Temp 98 ??F (36.7 ??C)   Resp 19   Ht 5\' 6"  (1.676 m)   Wt 189 lb 6.4 oz (85.911 kg)   BMI 30.57 kg/m2   SpO2 98%  05/23/2011    Visit Vitals   Item Reading   ??? BP 165/69   ??? Pulse 111   ??? Temp 99 ??F (37.2 ??C)   ??? Resp 20   ??? Ht 5\' 6"  (1.676 m)   ??? Wt 85.957 kg (189 lb 8 oz)   ??? BMI 30.59 kg/m2   ??? SpO2 95%        Readmit Score: 13  Prior Hospital Admissions  Patient Been Admitted to the Hospital Prior to this Encounter: No  Specialist Care: Yes (comment)  Readmit Risk Tool  Scheduled Admission: No  Living Alone: Yes   Caregivers/Community Support: Yes  History of Mental Illness: No  Polypharmacy (Greater Than 7 Home Meds): No  Currently Prescribed : Insulin  Requires Financial, Physical and/or Educational Assistance With Medications: No  Independent with ADLs: Yes  Needs Assistance with Wound Care AND/OR Mgnt of O2, Nebulizer: Yes  Clinically Complex: Yes  History of Falls Within Past 3 Months: Yes  Chronic Medical Condition: Yes  Patient Directs Own Care: Yes  Readmit RISK Tool Score: 13                        __________________________________________________________________    Patient / Family Goals For Today:    05/17/2011: renal eval, adjust med, plan for cath 05/20/11  05/22/2011: NSTEMI (non-ST elevated myocardial infarction) / CAD (coronary artery disease) - med tx.    Ischemic toe ulcer - Slow healing ulcer likely due to DM/PAD bone scan today, angio later this week. Wound care   DM with renal complications / DKA, type 2, not at goal - Appreciate endocrine help. BG bottomed out last night, better this AM. Continue DM diet, and scheduled Lantus and Lispro, and will add SSI per low dose protocol.   Acute renal failure (ARF) / Hyponatremia / Chronic kidney disease, stage III (moderate) - Appreciate nephrology assistance. Probably near baseline with Cr 1.4. Follow with gentle hydration.   Anemia - Stable. From chronic disease. No active bleeding.   PAD (peripheral artery disease) - PVR tracing 05/18/2011 suggest bilateral SFA occlusion and calf vessel disease and high grade stenosis of his proximal fem-pop Angio March 2012 ([facility]): r sfa-ak pop with prox stenosis, 1 vs r/o; left diffuse sfa , severe tpt disease   HTN (hypertension) - Continue metoprolol, catapress, terazosin, add Norvasc if needed   Unspecified hypothyroidism - Continue synthroid.   GERD - Continue combination PPI, H2B, mioprostol.   Hyperlipidemia - Continue Zocor  05/23/2011  Norvasc added for inc BP today.  Angiogram LLE tomorrow.  Mucomyst / fluids today.  Creat 1.38   05/24/2011  NSTEMI/CAD: Occluded RCA and LCx. Pursuing med mgmt. Continue ASA, BB bid, ACE- I. statin. Plavix started post angio today. Have arranged OP follow up with Dr Welton Flakes HTN: may need inc of norvasc, watch  PVD: sfa and poplteal stenoses angioplastied to 3-17mm with resolution of  stenoses             ___________________________________________________________________   Date: 05/17/2011              Cardiac Rehab education: NSTEMI, pre cath for Monday   ___________________________________________________________________    CORE MEASURE MEDICATIONS : (include dosage with meds as indicated below)  Current Facility-Administered Medications   Medication Dose Route Frequency   ??? amLODIPine (NORVASC) tablet 5 mg  5 mg Oral DAILY    ??? pneumococcal 23-valent (PNEUMOVAX 23) injection 0.5 mL  0.5 mL IntraMUSCular PRIOR TO DISCHARGE   ??? metoprolol (LOPRESSOR) tablet 50 mg  50 mg Oral BID   ??? lisinopril (PRINIVIL, ZESTRIL) tablet 20 mg  20 mg Oral DAILY   ??? insulin lispro (HUMALOG) injection 6 Units  6 Units SubCUTAneous TIDAC   ??? insulin lispro (HUMALOG) injection   SubCUTAneous AC&HS   ??? acetaminophen (TYLENOL) tablet 650 mg  650 mg Oral Q6H PRN   ??? acetylcysteine (MUCOMYST) 200 mg/mL (20 %) solution 600 mg  600 mg Oral Q12H   ??? 0.9% sodium chloride infusion  75 mL/hr IntraVENous CONTINUOUS   ??? cloNIDine (CATAPRES) tablet 0.1 mg  0.1 mg Oral Q6H PRN   ??? silver sulfADIAZINE (SILVADENE) 1 % topical cream   Topical BID   ??? insulin glargine (LANTUS) injection 20 Units  20 Units SubCUTAneous ACB   ??? sodium hypochlorite (1/4 STRENGTH DAKINS) irrigation (bottle)   Topical DAILY   ??? pantothenic ac-min oil-pet,hyd (AQUAPHOR) 41 % ointment   Topical TID   ??? aspirin chewable tablet 81 mg  81 mg Oral DAILY   ??? levothyroxine (SYNTHROID) tablet 150 mcg  150 mcg Oral ACB   ??? misoprostol (CYTOTEC) tablet 200 mcg  200 mcg Oral BID WITH MEALS   ??? simvastatin (ZOCOR) tablet 20 mg  20 mg Oral QHS   ??? terazosin (HYTRIN) capsule 10 mg  10 mg Oral QHS   ??? SALINE PERIPHERAL FLUSH Q8H Soln 5 mL  5 mL InterCATHeter Q8H   ??? saline peripheral flush Soln 5 mL  5 mL InterCATHeter PRN   ??? oxyCODONE-acetaminophen (PERCOCET) 5-325 mg per tablet 1 Tab  1 Tab Oral Q6H PRN   ??? morphine injection 2 mg  2 mg IntraVENous Q4H PRN   ??? naloxone (NARCAN) injection 0.4 mg  0.4 mg IntraVENous PRN   ??? diphenhydrAMINE (BENADRYL) capsule 25 mg  25 mg Oral Q4H PRN   ??? ondansetron (ZOFRAN) injection 4 mg  4 mg IntraVENous Q4H PRN   ??? magnesium hydroxide (MILK OF MAGNESIA) oral suspension 30 mL  30 mL Oral DAILY PRN   ??? docusate sodium (COLACE) capsule 100 mg  100 mg Oral BID   ??? bisacodyl (DULCOLAX) suppository 10 mg  10 mg Rectal DAILY PRN   ??? glucose chewable tablet 16 g  4 Tab Oral PRN    ??? dextrose (D50W) injection Syrg 12.5-25 g  12.5-25 g IntraVENous PRN   ??? glucagon (GLUCAGEN) injection 1 mg  1 mg IntraMUSCular PRN   ??? pantoprazole (PROTONIX) tablet 40 mg  40 mg Oral ACB   ??? promethazine (PHENERGAN) 12.5 mg in NS 50 ml IVPB  12.5 mg IntraVENous Q8H PRN   ??? famotidine (PEPCID) tablet 20 mg  20 mg Oral ACB&D         *ACE/ARB if EF <40%:   Lisinopril EF 55%  If none, has MD documented why?      *Beta Blocker :   Lopressor                                    If none, has MD documented why?      ASA :   81 mg   *ASA on arrival ( if AMI ) :          325 yes                                   If AMI and none given,has MD documented why?         Statin : zocor        If AMI and none ordered, has MD documented why?     I___________________________________________________________________  Lab Results   Component Value Date/Time    Cholesterol, total 123 05/17/2011  2:22 AM    HDL Cholesterol 55 05/17/2011  2:22 AM    LDL, calculated 58.4 05/17/2011  2:22 AM    Triglyceride 48 05/17/2011  2:22 AM    CHOL/HDL Ratio 2.2 05/17/2011  6:96 AM       CMP:   Lab Results   Component Value Date/Time    NA 132* 05/22/2011  5:05 AM    K 4.4 05/22/2011  5:05 AM    CL 99 05/22/2011  5:05 AM    CO2 27 05/22/2011  5:05 AM    AGAP 6 05/22/2011  5:05 AM    GLU 234* 05/22/2011  5:05 AM    BUN 21* 05/22/2011  5:05 AM    CREA 1.44* 05/22/2011  5:05 AM    GFRAA 59* 05/22/2011  5:05 AM    GFRNA 48* 05/22/2011  5:05 AM    CA 8.7 05/22/2011  5:05 AM    PHOS 4.6 05/22/2011  5:05 AM    ALB 2.8* 05/22/2011  5:05 AM       CBC:   Lab Results   Component Value Date/Time    WBC 5.9 05/22/2011  5:05 AM    HGB 9.5* 05/22/2011  5:05 AM    HCT 28.8* 05/22/2011  5:05 AM    PLT 133* 05/22/2011  5:05 AM       _______________________________________________________________  GI PX:  PPI       DVT PX:     Y     Type:   Heparin                    Lines & Drains    a) Central Line:          No           b) Foley Catheter:        No                    Placement Date:     Clinical reason for continuing foley :      ______________________________________________________________________  Discharge Planning:  a) Pt Admitted From: Home lives alone   b) Health care partner: son down the street , friend is Karolee Ohs, LPN   C) Discharge to: Home with Home Health -[facility]   D) Out patient cardiac rehab: N            E) Pt has a scale  ______________________________________________________________________  Vaccination Information:      There is no immunization history on file for this patient.  Screen today 05/17/2011      History of Smoking within the past year?:    N                        Smoking Cessation Education Provided and documented:       History     Social History   ??? Marital Status: Single     Spouse Name: N/A     Number of Children: N/A   ??? Years of Education: N/A     Occupational History   ??? Not on file.     Social History Main Topics   ??? Smoking status: Former Smoker -- 20 years     Types: Cigarettes     Quit date: 11/09/1978   ??? Smokeless tobacco: Never Used   ??? Alcohol Use: No   ??? Drug Use: No   ??? Sexually Active: Not on file     Other Topics Concern   ??? Not on file     Social History Narrative   ??? No narrative on file       ______________________________________________________________________

## 2011-05-17 NOTE — Consults (Signed)
Consult received  We'll see what the PVRs show and then decide where to go from there  Thank you

## 2011-05-17 NOTE — Other (Signed)
DTC Consult Note    Recommendations/ Comments: admitted with BG >600. He was on an insulin gtt until 8AM this AM. He received his first dose of Lantus 30 at 9:46AM. Continue to observe BG response now that he has transitioned off the gtt to Lantus and Novolog. Seen by Endocrinologist today. Lantus decreased to 20 units starting tomorrow.     He states that he was diagnosed with gout. He feels he needs help with dietary instruction for gout.     Consult received for:  [x]              Assessment of home management     [x]              Meter/monitoring     [x]              Insulin instruction     []              New diagnosis     []              Outpatient education     []              Insulin pump patient     []              Insulin infusion     []              DKA/HHS    Chart reviewed and initial evaluation complete on Travis [name].    Patient is a 70 y.o. male with Type 2 DM on Lantus each PM and Novolog scale at breakfast and supper at home.  He may also require meal time insulin.    BG monitoring at home at least twice, and sometimes 3-4x/day.  Patient reports BG levels at home - sometimes around 120. At times low - he reports hypoglycemia requiring help from another person about 3-4 months ago. He states that he recognizes it and can treat it if he is awake. However, if it occurs during his sleep he wakes up confused and disoriented. The hypoglycemia frightens him because he lives alone.     He reports BG's being higher starting "a few days ago". Yesterday he had a lot of pain in his foot. Then he began feeling sick so he did not take his insulin.    Assessed and instructed patient on the following: .     BG testing: Travis [name] has a meter and is able to get supplies. He reports testing BG's 2-4x/day. Reviewed BG goals.   Medication: He states that he takes the Lantus each PM and Novolog scale at meals. He verbalized testing BG, then following the scale to dose the Novolog at breakfast and supper. He states that he rarely misses a dose. He uses a Novolog pen and Lantus vial. He just got new glasses and can now see the liens on the syringe.   Nutrition: He reports that holiday times are particularly difficult. He admits to eating foods that he should avoid.   Explained meal plan using the plate method. Avoid foods high in sugar.  Hypoglycemia reviewed.  Foot care: He began having foot pain "a few days ago". Identified with and being treated for a foot ulcer today. Special shoes are being ordered. Reviewed foot care with im. He states he just saw his foot doctor and has an appointment on 05/22/11.   Hyperglycemia and Sick days: Informed of effects of illness on BG. In future, if ill be sure to contact  MD. Do not skip insulin injections.     He is feeling overwhelmed and resigned at having so many problems arise all at one time.     Provided patient with the following: [x]              Survival skills education materials               []              Insulin education materials               []              CHO counting education materials               [x]              Outpatient DTC contact number               []              Glucometer               []              Insulin start kit- vial/syringe               []              Insulin start kit- pen    Patient was able to give return demonstration of []        glucometer []        saline injection with []       no/ []        minimal assistance needed.  []        Nurse to have patient self inject prior to discharge.    A1c:   Lab Results   Component Value Date/Time    Hemoglobin A1c 8.7 06/22/2009  4:55 AM       POC Glucose last 24hrs:   Lab Results   Component Value Date/Time    POC GLUCOSE 425 05/17/2011 11:48 AM    POC GLUCOSE 136 05/17/2011  8:17 AM    POC GLUCOSE 93 05/17/2011  7:14 AM     POC GLUCOSE 94 05/17/2011  6:11 AM    POC GLUCOSE 94 05/17/2011  5:07 AM    POC GLUCOSE 120 05/17/2011  3:58 AM    POC GLUCOSE 68 05/17/2011  3:39 AM    POC GLUCOSE 82 05/17/2011  2:35 AM       Lab Results   Component Value Date/Time    Creatinine 2.98 05/17/2011  2:22 AM       Current hospital DM medication: Lantus 30 units each day. This dose will decrease to 20 units tomorrow. Novolog scale.     Will continue to follow as needed.    Thank you.  Travis Quinones RN, CDE

## 2011-05-17 NOTE — Progress Notes (Signed)
2D Echocardiogram with doppler completed.

## 2011-05-17 NOTE — Progress Notes (Signed)
Travis [name] was seen and examined.   No more chest pain. He ruled in for NSTEMI.  Due to ARF on CKD, will wait on cath until renal function stable - tentatively plan to cath Monday.   Continue heparin.  Full note to follow.

## 2011-05-17 NOTE — Other (Signed)
Cardiopulmonary Rehab:  Printed [employer]F Energy manager on MI, angina, coronary artery disease, heart healthy/low salt diet, cardiac catheterization, and cardiac rehab given to patient.  Patient has history of CHF and cardiac cath and stent in the past.  Patient instructed on CHF, daily weight monitoring, signs and symptoms of fluid retention to notify physician, heart healthy/low salt diet, activity as tolerated with rest between activities, taking medications as prescribed and follow-up with physician as instructed.  Patient instructed on risk factors for coronary artery disease and risk factor management, importance of managing his diabetes, checking his blood sugars and following diabetic diet and taking medications as prescribed.  Patient quit smoking in 1980.  Patient stated that he lives by himself and his children are available for assistance with transportation and meals as needed.  Patient instructed that outpatient cardiac rehab is available for follow-up.  Reviewed cardiac cath/possible stent procedure with patient.  Patient voiced understanding of instructions.  Will follow.

## 2011-05-17 NOTE — Wound Image (Signed)
Wound Care Consult: Pt seen per mdconsult, for diabetic foot wound Chart reviewed and patient assessed. Patient sitting up in chair has painful neuropathy does not like having his toe touched.  Unable to palpate pulses.  Patient report he has only had wound for a week and it was black before and the dr in [place] whos name he does not know took the black part off. Patient sugars have also been running high. 400-600 prior to admission    Assessment: WOUND POA CONDITIONS    Wound Toe Left;Dorsal (Active)   Non-Pressure Ulcer Full thickness (subcut/muscle) 05/17/2011  1:28 PM   Wound Dimensions (length x width x depth) 0.8x0.6x0.3 05/17/2011  1:28 PM   Drainage Amount  Moderate 05/17/2011  1:28 PM   Drainage Color Yellow 05/17/2011  1:28 PM   Wound Odor None 05/17/2011  1:28 PM   Periwound Skin Condition Edematous;Erythema, blanchable 05/17/2011  1:28 PM   Cleansing and Cleansing Agents  Sodium hypochlorite 25 % 05/17/2011  1:28 PM   Dressing Type Applied Silver products 05/17/2011  1:28 PM   Procedure Tolerated Poor 05/17/2011  1:28 PM   Number of days:0             Recommendation: Vascular consult deminished pulses, pvr, culture wound , wound care with acticoat dry dressing, offloading shoe rocker bottom . Be happy to f/u at discharge in wound center.

## 2011-05-17 NOTE — Consults (Signed)
Name:       Travis [name], Travis [name]          Admitted:          05/16/2011                                         DOB:               July 17, 1940  Account #:  [id number]               Age:               70  Consultant: Laureen Ochs, M.D.          Location           [id number]                                CONSULTATION REPORT    DATE OF CONSULTATION      REQUESTING PHYSICIAN:  Khoi B. Do, MD    CHIEF COMPLAINT:  Elevated creatinine.    HISTORY OF PRESENT ILLNESS:  The patient is a 70 year old African-American  male with chronic kidney disease, stage III. He is followed in the  outpatient setting by me. He has multiple chronic medical problems  including anemia of chronic kidney disease which is treated with Procrit  and oral iron. He has type 2 diabetes mellitus treated with subcutaneous  insulin. He has coronary artery disease and is status post-intervention in  the past.    The patient was in his usual state of health until yesterday when he became  acutely ill with a number of symptoms. He had 5/10 substernal chest pain,  some nausea, and diminished intake. He did not take his insulin. He came to  the emergency room for evaluation. He was noted to be hyperglycemic and had  a mildly elevated troponin. He has been admitted to the hospital for  observation, started on heparin. We are asked to see him regarding elevated  serum creatinine.    The patient's serum creatinine on admission to the hospital was 2.73 and  today is up to 2.98. He did receive a dose of furosemide last night. A  prior baseline serum creatinine from my office when I saw him last in July  was 2.4.    REVIEW OF SYSTEMS  CONSTITUTIONAL: No fevers or chills.  GASTROINTESTINAL: No nausea, vomiting, or diarrhea today.  CARDIOVASCULAR: Chest pain has resolved, some mild dyspnea.  GENITOURINARY: No hematuria, dysuria, or difficulty voiding.  All other systems reviewed and are negative except as per history of  present illness.     PAST MEDICAL HISTORY:  Chronic kidney disease, stage III, with baseline  creatinine 2.4, BPH, hypothyroidism, coronary artery disease, carotid  artery disease, peripheral vascular disease, type 2 diabetes mellitus,  obstructive sleep apnea, hyperlipidemia, anemia of chronic kidney disease,  vitamin D deficiency.    FAMILY HISTORY:  Positive for lung cancer in his father and hypertension in  other family members.    SOCIAL HISTORY:  The patient is a former smoker.    PHYSICAL EXAMINATION  VITAL SIGNS: Temperature 98.0, pulse 60, blood pressure 118/49.  GENERAL: Pleasant African-American male in no acute distress.  EYES: Sclerae are anicteric. Pupils round and reactive.  ENMT: Mucous membranes are moist. There is no external auditory canal  discharge.  NECK: Nontender with no masses. Thyroid is not enlarged.  CARDIOVASCULAR: Heart is regular S1, S2. There is trace lower extremity  edema.  RESPIRATORY: Lungs are clear to auscultation bilaterally with good  respiratory effort.  GI: Abdomen is soft and nontender. Bowel sounds are active.  Hepatosplenomegaly is not appreciated.  SKIN: Warm with normal turgor. There is no rash.  MUSCULOSKELETAL: There is no cyanosis or clubbing. There is no synovitis of  the fingers or wrists bilaterally.    LABORATORY:  Potassium 4.0, creatinine 2.98.    RADIOGRAPHIC STUDIES  Chest x-ray shows moderate pulmonary vascular congestive changes.    EKG, personally reviewed by me, shows sinus rhythm with nonspecific ST  changes and left atrial enlargement.    ASSESSMENT:  Elevated serum creatinine above prior baseline. The initial  differential here is progression of chronic kidney disease versus acute  renal failure. He could have acute renal failure secondary to volume  depletion from nausea, diminished intake, and diuretics. He could have  urinary retention with his history of benign prostatic hypertrophy. He  could have allergic interstitial nephritis or other causes of acute renal   failure. Additionally, he has multiple risk factors and certainly is at  risk for progression of chronic kidney disease and may have had some  progression since last seen in the office.    PLAN  1. Check urinalysis, which will help to guide the differential.  2. Check postvoid bladder scan to rule out urinary retention.  3. He is currently being treated with ACE inhibitor and loop diuretic for  concern of heart failure. Based on his radiographic studies, I have not  changed his regimen, but we will watch closely.  4. Certainly, if serum creatinine worsens without other explanations we  could consider holding loop diuretic or ACE inhibitor. I think it is fine  to continue for now.  5. Monitor frequent labs and fluid balance.  6. Obviously, if coronary angiography is necessary some attempt at  prophylaxis would be reasonable with his advanced kidney disease and  diabetes.  7. We will follow closely with you to assist in his management during this  hospitalization.              Laureen Ochs, M.D.    cc:   Laureen Ochs, M.D.      DG/wmx; D: 05/17/2011  8:32 A; T: 05/17/2011  8:57 A; DOC# [id number]; Job#  [id number]

## 2011-05-17 NOTE — Progress Notes (Signed)
physical Therapy EVALUATION/DISCHARGE  Patient: Travis [name] (70 y.o. male)  Date: 05/17/2011  Primary Diagnosis: NSTEMI (non-ST elevated myocardial infarction)        Precautions:   Other (comment) (wedge shoe on the left to off load toes.)  ASSESSMENT :  Based on the objective data described above, the patient presents with independent with ambulation without assistive device, issuded a rocker bottom shoe for the left foot to off load toes.Patient stated he already has a single point caner at home and he uses it occasionally. Ambulate patient with it at first, patient was unsteady but once he understand to just put weight on the heel, patient was more stable.  Recommended to patient to still [name] the single point cane at home for more stability. Independent with all functional mobility. Reviewed all safety precaution and home exercise program with the patient, verbalized understanding, clear to go home per Physical Therapy perspective.  Skilled physical therapy is not indicated at this time.       PLAN :  Discharge Recommendations: None  Further Equipment Recommendations for Discharge: none already has a single point cane at home.     SUBJECTIVE:   Patient stated ???I feel ok.???    OBJECTIVE DATA SUMMARY:     Past Medical History   Diagnosis Date   ??? CAD (coronary artery disease)      see problem list   ??? HTN (hypertension)    ??? PVD (peripheral vascular disease)    ??? DM (diabetes mellitus)    ??? Hyperlipidemia    ??? Sleep apnea      on CPAP   ??? COPD (chronic obstructive pulmonary disease)      mild COPD, recurrent pneumonia, chronic bronchitis (followed by Dr. Leroy Libman)   ??? CKD (chronic kidney disease)      Cr 1.7 in 10/09   ??? CHF (congestive heart failure)    ??? Anemia    ??? Pulmonary hypertension      PASP 60 on echo 06/22/09   ??? Thromboembolus      leg    ??? Thyroid disease    ??? Pneumonia    ??? GERD (gastroesophageal reflux disease)    ??? Chronic pain      back pain   ??? Cataract    ??? GI bleed       multiple bleeding episodes and blood transfusions.  Dr. Monika Salk has followed..  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     Past Surgical History   Procedure Date   ??? Hx orthopaedic      knee replacement   ??? Pr vascular surgery procedure unlist      arterial bypass   ??? Hx pacemaker    ??? Pr cardiac surg procedure unlist      cardiac stent   ??? Hx cataract removal      removed rt eye     Prior Level of Function/Home Situation: Independent community ambulator without assistive device but occasionally use a single point cane.   Home Situation  Home Environment: Private residence  # Steps to Enter: 5   Rails to Enter: Yes  Hand Rails : Bilateral  One/Two Story Residence: Two Nutritional therapist of Interior Steps: 12   Height of Each Step (in): 4 inches  Interior Rails: Left  Lift Chair Available: No  Living Alone: Yes   Support Systems: Child(ren)  Patient Expects to be Discharged to:: Private residence  Current DME Used/Available at Home: Cane, straight  Tub or Shower Type:  Tub/Shower combination  Critical Behavior:  Neurologic State: Alert  Orientation Level: Oriented X4  Cognition: Appropriate decision making;Appropriate for age attention/concentration;Appropriate safety awareness  Safety/Judgement: Awareness of environment;Good awareness of safety precautions  Strength:    Strength: Within functional limits                    Tone & Sensation:   Tone: Normal              Sensation: Intact              Range Of Motion:  AROM: Within functional limits           PROM: Within functional limits           Coordination:  Coordination: Within functional limits    Functional Mobility:  Bed Mobility:  Rolling: Independent  Supine to Sit: Independent  Sit to Supine: Independent  Scooting: Independent  Transfers:  Sit to Stand: Independent  Stand to Sit: Independent  Stand Pivot Transfers: Completely independent     Bed to Chair: Independent              Balance:   Sitting: Intact  Standing: Intact  Ambulation/Gait Training:   Distance (ft): 90 Feet (ft)     Ambulation - Level of Assistance: Modified independent     Gait Description (WDL): Exceptions to WDL  Gait Abnormalities: Antalgic     Left Side Weight Bearing: Heel (wedge shoe on the left to off load toes.)  Base of Support: Narrowed                           Therapeutic Exercises:    Instructed patient to continue active range of motion exercise on both legs while up on chair or on bed.      Pain:  Pain Scale 1: Numeric (0 - 10)  Pain Intensity 1: 0  Pain Location 1: Leg  Activity Tolerance:   Good.  Please refer to the flowsheet for vital signs taken during this treatment.  After treatment:   [x]    Patient left in no apparent distress sitting up in chair  []    Patient left in no apparent distress in bed  [x]    Call bell left within reach  [x]    Nursing notified  []    Caregiver present  []    Bed alarm activated    COMMUNICATION/EDUCATION:   Communication/Collaboration:  [x]    Fall prevention education was provided and the patient/caregiver indicated understanding.  [x]    Patient/family have participated as able and agree with findings and recommendations.  []    Patient is unable to participate in plan of care at this time.  Findings and recommendations were discussed with: Occupational Therapist, Registered Nurse and patient    Thank you for this referral.  ALBERTO E DELA CRUZ, PT,WCC.   Time Calculation: 29 mins

## 2011-05-17 NOTE — Consults (Signed)
[facility] Consult    Subjective:     Travis [name] is a 70 y.o. African American male who is being seen in consultation at the request of Dr. Drucilla Schmidt for evaluation of poorly controlled diabetes.         HPI: Pt is a 70 year old patient with Type 2 diabetes and CKD, admitted with recurrent DKA.  The patient is a patient of mine but hasn't been seen since June 2011.  At home he takes 30 units of lantus in the morning plus a sliding scale of novolog.  He was also supposed to be taking a baseline of 5 units of novolog before meals but apparently did not understand this and therefore hasn't been doing it.  His sugars have reportedly been very high and then dropping with mental status changes.        Pt was admitted with chest pain and found to have very high blood sugars.  He will be having a cath on Monday.          Pt has been suffering from gout and has an infected left great toe that he has been wrapping at home.      Past Medical History   Diagnosis Date   ??? CAD (coronary artery disease)      see problem list   ??? HTN (hypertension)    ??? PVD (peripheral vascular disease)    ??? DM (diabetes mellitus)    ??? Hyperlipidemia    ??? Sleep apnea      on CPAP   ??? COPD (chronic obstructive pulmonary disease)      mild COPD, recurrent pneumonia, chronic bronchitis (followed by Dr. Leroy Libman)   ??? CKD (chronic kidney disease)      Cr 1.7 in 10/09   ??? CHF (congestive heart failure)    ??? Anemia    ??? Pulmonary hypertension      PASP 60 on echo 06/22/09   ??? Thromboembolus      leg    ??? Thyroid disease    ??? Pneumonia    ??? GERD (gastroesophageal reflux disease)    ??? Chronic pain      back pain   ??? Cataract    ??? GI bleed      multiple bleeding episodes and blood transfusions.  Dr. Monika Salk has followed..  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      Past Surgical History   Procedure Date   ??? Hx orthopaedic      knee replacement   ??? Pr vascular surgery procedure unlist      arterial bypass   ??? Hx pacemaker     ??? Pr cardiac surg procedure unlist      cardiac stent   ??? Hx cataract removal      removed rt eye     The patient has a family history of  shoulder  Current Facility-Administered Medications   Medication Dose Route Frequency   ??? insulin glargine (LANTUS) injection 30 Units  30 Units SubCUTAneous ACB   ??? insulin lispro (HUMALOG) injection   SubCUTAneous AC&HS   ??? ondansetron (ZOFRAN) injection 4 mg  4 mg IntraVENous NOW   ??? aspirin (ASPIRIN) tablet 325 mg  325 mg Oral NOW   ??? nitroglycerin (NITROBID) 2 % ointment 0.5 Inch  0.5 Inch Topical BID   ??? insulin regular (NOVOLIN, HUMULIN) injection 10 Units  10 Units IntraVENous NOW   ??? furosemide (LASIX) injection 40 mg  40 mg IntraVENous  ONCE   ??? aspirin chewable tablet 81 mg  81 mg Oral DAILY   ??? bumetanide (BUMEX) tablet 2 mg  2 mg Oral DAILY   ??? levothyroxine (SYNTHROID) tablet 150 mcg  150 mcg Oral ACB   ??? lisinopril (PRINIVIL, ZESTRIL) tablet 20 mg  20 mg Oral QHS   ??? metoprolol (LOPRESSOR) tablet 50 mg  50 mg Oral BID   ??? misoprostol (CYTOTEC) tablet 200 mcg  200 mcg Oral BID WITH MEALS   ??? simvastatin (ZOCOR) tablet 20 mg  20 mg Oral QHS   ??? terazosin (HYTRIN) capsule 10 mg  10 mg Oral QHS   ??? 0.9% sodium chloride infusion  50 mL/hr IntraVENous CONTINUOUS   ??? SALINE PERIPHERAL FLUSH Q8H Soln 5 mL  5 mL InterCATHeter Q8H   ??? saline peripheral flush Soln 5 mL  5 mL InterCATHeter PRN   ??? acetaminophen (TYLENOL) tablet 650 mg  650 mg Oral Q6H PRN   ??? acetaminophen (TYLENOL) suppository 650 mg  650 mg Rectal Q4H PRN   ??? oxyCODONE-acetaminophen (PERCOCET) 5-325 mg per tablet 1 Tab  1 Tab Oral Q6H PRN   ??? morphine injection 2 mg  2 mg IntraVENous Q4H PRN   ??? naloxone (NARCAN) injection 0.4 mg  0.4 mg IntraVENous PRN   ??? acetaminophen (TYLENOL) tablet 650 mg  650 mg Oral Q6H PRN   ??? diphenhydrAMINE (BENADRYL) capsule 25 mg  25 mg Oral Q4H PRN   ??? ondansetron (ZOFRAN) injection 4 mg  4 mg IntraVENous Q4H PRN    ??? magnesium hydroxide (MILK OF MAGNESIA) oral suspension 30 mL  30 mL Oral DAILY PRN   ??? docusate sodium (COLACE) capsule 100 mg  100 mg Oral BID   ??? bisacodyl (DULCOLAX) suppository 10 mg  10 mg Rectal DAILY PRN   ??? glucose chewable tablet 16 g  4 Tab Oral PRN   ??? dextrose (D50W) injection Syrg 12.5-25 g  12.5-25 g IntraVENous PRN   ??? glucagon (GLUCAGEN) injection 1 mg  1 mg IntraMUSCular PRN   ??? pantoprazole (PROTONIX) tablet 40 mg  40 mg Oral ACB   ??? promethazine (PHENERGAN) 12.5 mg in NS 50 ml IVPB  12.5 mg IntraVENous Q8H PRN   ??? famotidine (PEPCID) tablet 20 mg  20 mg Oral ACB&D   ??? heparin 25,000 units in D5W 250 ml infusion  12-25 Units/kg/hr IntraVENous TITRATE      No Known Allergies     Review of Systems:  A comprehensive review of systems was negative except for that written in the History of Present Illness.     Objective:     Vitals:   Visit Vitals   Item Reading   ??? BP 118/49   ??? Pulse 68   ??? Temp 98 ??F (36.7 ??C)   ??? Resp 13   ??? Ht 5\' 6"  (1.676 m)   ??? Wt 185 lb 8 oz   ??? BMI 29.94 kg/m2   ??? SpO2 98%       Physical Exam:   BP 118/49   Pulse 68   Temp 98 ??F (36.7 ??C)   Resp 13   Ht 5\' 6"  (1.676 m)   Wt 185 lb 8 oz   BMI 29.94 kg/m2   SpO2 98%  General appearance: alert, cooperative, no distress, appears stated age  Head: Normocephalic, without obvious abnormality, atraumatic  Neck: supple, symmetrical, trachea midline, no adenopathy, thyroid: not enlarged, symmetric, no tenderness/mass/nodules, no carotid bruit and no JVD  Lungs: crackles  at bases that clear with coughing  Heart: regular rate and rhythm, S1, S2 normal, no murmur, click, rub or gallop  Abdomen: soft, non-tender. Bowel sounds normal. No masses,  no organomegaly  Skin: left heel with significant dryness and cracking, left great toe is wrapped and slight erythema is noted of the left second and third toe  Neurologic: Grossly normal      Data Review:   Recent Results (from the past 24 hour(s))   POC CHEM8    Collection Time     05/16/11  4:24 PM       Component Value Range    Calcium, ionized (POC) 1.09 (*) 1.12 - 1.32 MMOL/L    Sodium (POC) 135 (*) 136 - 145 MMOL/L    Potassium (POC) 5.2 (*) 3.5 - 5.1 MMOL/L    Chloride (POC) 101  98 - 107 MMOL/L    CO2 (POC) 18 (*) 21 - 32 MMOL/L    Anion gap (POC) 22 (*) 5 - 15 mmol/L    Glucose (POC) 637 (*) 75 - 110 MG/DL    BUN (POC) 38 (*) 9 - 20 MG/DL    Creatinine (POC) 2.2 (*) 0.6 - 1.3 MG/DL    GFR-AA (POC) 36 (*) >60 ml/min/1.47m2    GFR, non-AA (POC) 30 (*) >60 ml/min/1.64m2    Hemoglobin (POC) 11.9 (*) 12.1 - 17.0 GM/DL    Hematocrit (POC) 35 (*) 36.6 - 50.3 %    Comment Comment Not Indicated.     POC TROPONIN-I    Collection Time    05/16/11  4:26 PM       Component Value Range    Troponin-I (POC) 0.33 (*) 0.00 - 0.08 ng/mL   CBC WITH AUTOMATED DIFF    Collection Time    05/16/11  4:30 PM       Component Value Range    WBC 6.5  4.1 - 11.1 K/uL    RBC 3.68 (*) 4.10 - 5.70 M/uL    HGB 11.1 (*) 12.1 - 17.0 g/dL    HCT 16.1 (*) 09.6 - 50.3 %    MCV 89.9  80.0 - 99.0 FL    MCH 30.2  26.0 - 34.0 PG    MCHC 33.5  30.0 - 36.5 g/dL    RDW 04.5 (*) 40.9 - 14.5 %    PLATELET 127 (*) 150 - 400 K/uL    NEUTROPHILS 79 (*) 32 - 75 %    LYMPHOCYTES 14  12 - 49 %    MONOCYTES 7  5 - 13 %    EOSINOPHILS 0  0 - 7 %    BASOPHILS 0  0 - 1 %    ABS. NEUTROPHILS 5.1  1.8 - 8.0 K/UL    ABS. LYMPHOCYTES 0.9  0.8 - 3.5 K/UL    ABS. MONOCYTES 0.4  0.0 - 1.0 K/UL    ABS. EOSINOPHILS 0.0  0.0 - 0.4 K/UL    ABS. BASOPHILS 0.0  0.0 - 0.1 K/UL   PRO-BNP    Collection Time    05/16/11  4:30 PM       Component Value Range    NT pro-BNP 2115 (*) 0 - 125 PG/ML   HEPATIC FUNCTION PANEL    Collection Time    05/16/11  4:30 PM       Component Value Range    Protein, total 8.9 (*) 6.4 - 8.2 g/dL    Albumin 3.7  3.5 - 5.0 g/dL    Globulin  5.2 (*) 2.0 - 4.0 g/dL    A-G Ratio 0.7 (*) 1.1 - 2.2      Bilirubin, total 0.6  0.2 - 1.0 MG/DL    Bilirubin, direct 0.2  0.0 - 0.2 MG/DL    Alk. phosphatase 112  50 - 136 U/L     AST 31  15 - 37 U/L    ALT 23  12 - 78 U/L   MAGNESIUM    Collection Time    05/16/11  4:30 PM       Component Value Range    Magnesium 2.1  1.6 - 2.4 MG/DL   TROPONIN I    Collection Time    05/16/11  4:30 PM       Component Value Range    Troponin-I, Qt. 0.75 (*) <0.05 ng/mL   CK W/ CKMB & INDEX    Collection Time    05/16/11  4:30 PM       Component Value Range    CK - MB 32.8 (*) 0.5 - 3.6 NG/ML    CK-MB Index 10.5 (*) 0 - 2.5      CK 313 (*) 39 - 308 U/L   GLUCOSE, POC    Collection Time    05/16/11  5:52 PM       Component Value Range    POC GLUCOSE >600 (*) 75 - 110 mg/dL    Performed by Horton Chin Christell Constant) Chancy Hurter, POC    Collection Time    05/16/11  6:57 PM       Component Value Range    POC GLUCOSE 536 (*) 75 - 110 mg/dL    Performed by Horton Chin Christell Constant) Chancy Hurter, POC    Collection Time    05/16/11  8:06 PM       Component Value Range    POC GLUCOSE 462 (*) 75 - 110 mg/dL    Performed by Toma Copier     GLUCOSE, POC    Collection Time    05/16/11  9:14 PM       Component Value Range    POC GLUCOSE 368 (*) 75 - 110 mg/dL    Performed by Dawna Part     METABOLIC PANEL, COMPREHENSIVE    Collection Time    05/16/11  9:24 PM       Component Value Range    Sodium 135 (*) 136 - 145 MMOL/L    Potassium 4.1  3.5 - 5.1 MMOL/L    Chloride 97  97 - 108 MMOL/L    CO2 25  21 - 32 MMOL/L    Anion gap 13  5 - 15 mmol/L    Glucose 327 (*) 65 - 100 MG/DL    BUN 45 (*) 6 - 20 MG/DL    Creatinine 1.61 (*) 0.45 - 1.15 MG/DL    BUN/Creatinine ratio 16  12 - 20      GFR est AA 28 (*) >60 ml/min/1.44m2    GFR est non-AA 23 (*) >60 ml/min/1.33m2    Calcium 8.7  8.5 - 10.1 MG/DL    Bilirubin, total 0.4  0.2 - 1.0 MG/DL    ALT 21  12 - 78 U/L    AST 47 (*) 15 - 37 U/L    Alk. phosphatase 101  50 - 136 U/L    Protein, total 8.1  6.4 - 8.2 g/dL    Albumin 3.4 (*) 3.5 - 5.0 g/dL    Globulin  4.7 (*) 2.0 - 4.0 g/dL    A-G Ratio 0.7 (*) 1.1 - 2.2     CK W/ CKMB & INDEX    Collection Time    05/16/11  9:24 PM        Component Value Range    CK - MB 45.6 (*) 0.5 - 3.6 NG/ML    CK-MB Index 11.1 (*) 0 - 2.5      CK 410 (*) 39 - 308 U/L   TROPONIN I    Collection Time    05/16/11  9:24 PM       Component Value Range    Troponin-I, Qt. 14.97 (*) <0.05 ng/mL   GLUCOSE, POC    Collection Time    05/16/11 10:18 PM       Component Value Range    POC GLUCOSE 292 (*) 75 - 110 mg/dL    Performed by HENDERSON AMY     GLUCOSE, POC    Collection Time    05/16/11 11:21 PM       Component Value Range    POC GLUCOSE 215 (*) 75 - 110 mg/dL    Performed by Jean Rosenthal     GLUCOSE, POC    Collection Time    05/17/11 12:28 AM       Component Value Range    POC GLUCOSE 238 (*) 75 - 110 mg/dL    Performed by Jean Rosenthal     GLUCOSE, POC    Collection Time    05/17/11  1:31 AM       Component Value Range    POC GLUCOSE 140 (*) 75 - 110 mg/dL    Performed by Dawna Part     TROPONIN I    Collection Time    05/17/11  2:22 AM       Component Value Range    Troponin-I, Qt. 15.78 (*) <0.05 ng/mL   CK W/ CKMB & INDEX    Collection Time    05/17/11  2:22 AM       Component Value Range    CK - MB 42.8 (*) 0.5 - 3.6 NG/ML    CK-MB Index 9.8 (*) 0 - 2.5      CK 435 (*) 39 - 308 U/L   METABOLIC PANEL, BASIC    Collection Time    05/17/11  2:22 AM       Component Value Range    Sodium 137  136 - 145 MMOL/L    Potassium 4.0  3.5 - 5.1 MMOL/L    Chloride 101  97 - 108 MMOL/L    CO2 27  21 - 32 MMOL/L    Anion gap 9  5 - 15 mmol/L    Glucose 84  65 - 100 MG/DL    BUN 45 (*) 6 - 20 MG/DL    Creatinine 1.61 (*) 0.45 - 1.15 MG/DL    BUN/Creatinine ratio 15  12 - 20      GFR est AA 25 (*) >60 ml/min/1.79m2    GFR est non-AA 21 (*) >60 ml/min/1.22m2    Calcium 8.4 (*) 8.5 - 10.1 MG/DL   CBC W/O DIFF    Collection Time    05/17/11  2:22 AM       Component Value Range    WBC 6.1  4.1 - 11.1 K/uL    RBC 3.17 (*) 4.10 - 5.70 M/uL    HGB 9.4 (*) 12.1 - 17.0 g/dL    HCT 09.6 (*) 04.5 -  50.3 %    MCV 88.0  80.0 - 99.0 FL    MCH 29.7  26.0 - 34.0 PG     MCHC 33.7  30.0 - 36.5 g/dL    RDW 16.1 (*) 09.6 - 14.5 %    PLATELET 121 (*) 150 - 400 K/uL   LIPID PANEL    Collection Time    05/17/11  2:22 AM       Component Value Range    LIPID PROFILE          Cholesterol, total 123  <200 MG/DL    Triglyceride 48  <045 MG/DL    HDL Cholesterol 55      LDL, calculated 58.4  0 - 409 MG/DL    VLDL, calculated 9.6      CHOL/HDL Ratio 2.2  0 - 5.0     MAGNESIUM    Collection Time    05/17/11  2:22 AM       Component Value Range    Magnesium 2.1  1.6 - 2.4 MG/DL   PHOSPHORUS    Collection Time    05/17/11  2:22 AM       Component Value Range    Phosphorus 1.8 (*) 2.5 - 4.9 MG/DL   PTT    Collection Time    05/17/11  2:22 AM       Component Value Range    aPTT 28.7  24.0 - 31.5 sec    aPTT, therapeutic range      58.0 - 77.0 SECS   PROTHROMBIN TIME    Collection Time    05/17/11  2:22 AM       Component Value Range    INR 1.2 (*) 0.9 - 1.1      Prothrombin time 13.0 (*) 9.4 - 11.7 sec   HEPATIC FUNCTION PANEL    Collection Time    05/17/11  2:22 AM       Component Value Range    Protein, total 7.4  6.4 - 8.2 g/dL    Albumin 3.1 (*) 3.5 - 5.0 g/dL    Globulin 4.3 (*) 2.0 - 4.0 g/dL    A-G Ratio 0.7 (*) 1.1 - 2.2      Bilirubin, total 0.4  0.2 - 1.0 MG/DL    Bilirubin, direct 0.1  0.0 - 0.2 MG/DL    Alk. phosphatase 92  50 - 136 U/L    AST 54 (*) 15 - 37 U/L    ALT 21  12 - 78 U/L   GLUCOSE, POC    Collection Time    05/17/11  2:35 AM       Component Value Range    POC GLUCOSE 82  75 - 110 mg/dL    Performed by Dawna Part     GLUCOSE, POC    Collection Time    05/17/11  3:39 AM       Component Value Range    POC GLUCOSE 68 (*) 75 - 110 mg/dL    Performed by Jean Rosenthal     GLUCOSE, POC    Collection Time    05/17/11  3:58 AM       Component Value Range    POC GLUCOSE 120 (*) 75 - 110 mg/dL    Performed by Dawna Part     GLUCOSE, POC    Collection Time    05/17/11  5:07 AM       Component Value Range    POC GLUCOSE 94  75 -  110 mg/dL    Performed by Jean Rosenthal      GLUCOSE, POC    Collection Time    05/17/11  6:11 AM       Component Value Range    POC GLUCOSE 94  75 - 110 mg/dL    Performed by Jean Rosenthal     GLUCOSE, POC    Collection Time    05/17/11  7:14 AM       Component Value Range    POC GLUCOSE 93  75 - 110 mg/dL    Performed by Dawna Part     GLUCOSE, POC    Collection Time    05/17/11  8:17 AM       Component Value Range    POC GLUCOSE 136 (*) 75 - 110 mg/dL    Performed by Percell Boston     PTT    Collection Time    05/17/11 10:17 AM       Component Value Range    aPTT 90.2 (*) 24.0 - 31.5 sec    aPTT, therapeutic range      58.0 - 77.0 SECS   TROPONIN I    Collection Time    05/17/11 10:30 AM       Component Value Range    Troponin-I, Qt. 10.75 (*) <0.05 ng/mL   CK W/ CKMB & INDEX    Collection Time    05/17/11 10:30 AM       Component Value Range    CK - MB 44.8 (*) 0.5 - 3.6 NG/ML    CK-MB Index 10.3 (*) 0 - 2.5      CK 434 (*) 39 - 308 U/L         Assessment:   70 yo male with T2DM and CKD with uncontrolled blood sugars, high after meals with lows randomly and overnight at times.      Plan:  Plan:   The patient's lantus dose is likely too high given the hypoglycemia overnight and random drops.  I have decreased his lantus dose to 20 units in the morning.    I have discussed again with the patient the importance of taking novolog before his meals to prevent a rise in the blood sugars in response to the carbs that he eats.  For now, will start 5 units of novolog before meals with a consistent carb diet.    Agree with DTC to see for help with carb counting.   Upon discharge, the patient will start 5 units of novolog with 45 grams of carbs with his meals.    The patient is aware that he needs to follow-up with me in clinic within 4 weeks of discharge.      Signed By: Elaina Hoops, MD     May 17, 2011

## 2011-05-17 NOTE — Progress Notes (Signed)
Bedside and Verbal shift change report given to Ravy (oncoming nurse) by Sharol Croghan (offgoing nurse).  Report given with SBAR, Kardex and Recent Results.

## 2011-05-17 NOTE — Consults (Signed)
Tharon Aquas, MD. [facility]  [address]  [place], [place] [address]  Ph [phone];   Fax [phone]      Patient: Travis [name]  DOB: July 10, 1940      Today's Date: 05/17/2011      History of Present Illness:  Travis [name] is a 70 yo WM with complicated medical history including CAD (see details below) who p/w chest pain.  He has been having chest pain complaints past week and was taking SL NTG more frequently.  Yesterday he felt poorly with 5/10 substernal CP which persisted.  He was brought to the hospital - initial troponin 0.33, later peaking at 15 overnight.  EKG showed a V paced rhythm. CXR with mod pulm venous congestion.  Glc was 600.  Cr 2.98 this AM. Travis [name] was started on IV heparin overnight.  No chest discomfort this AM.         Past Medical History   Diagnosis Date   ??? CAD (coronary artery disease)      see problem list   ??? HTN (hypertension)    ??? PVD (peripheral vascular disease)    ??? DM (diabetes mellitus)    ??? Hyperlipidemia    ??? Sleep apnea      on CPAP   ??? COPD (chronic obstructive pulmonary disease)      mild COPD, recurrent pneumonia, chronic bronchitis (followed by Dr. Leroy Libman)   ??? CKD (chronic kidney disease)      Cr 1.7 in 10/09   ??? CHF (congestive heart failure)    ??? Anemia    ??? Pulmonary hypertension      PASP 60 on echo 06/22/09   ??? Thromboembolus      leg    ??? Thyroid disease    ??? Pneumonia    ??? GERD (gastroesophageal reflux disease)    ??? Chronic pain      back pain   ??? Cataract    ??? GI bleed      multiple bleeding episodes and blood transfusions.  Dr. Monika [name] has followed..  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       Past Surgical History   Procedure Date   ??? Hx orthopaedic      knee replacement   ??? Pr vascular surgery procedure unlist      arterial bypass   ??? Hx pacemaker    ??? Pr cardiac surg procedure unlist      cardiac stent   ??? Hx cataract removal      removed rt eye       Cardiac History:    Travis [name] has known coronary artery disease dating back to May 1999 when he underwent cardiac catheterization prior to planned peripheral vascular surgery. This showed distal disease in the apical LAD and right coronary artery with a normal ejection fraction.Non q MI in 7/06.cath showed mod LAD abn RCA disease up to 50-60% and a small occluded LCX with normal LVEF. Had elevated troponin (?NSTEMI) during admission with pneumonia and ARF 05/2009 - proceeded to have a PCI (DES) to RCA. He returned for a NSTEMI (trop ~30) on 06/21/09 but cath showed stable findings. NSTEMI with another cath in 2/11 - the stent was patent and no new findings. Multiple NSTEMI's in 5/11 - proceeded on 10/16/09 to PCI of 85% ulcerated plaque mid RCA successfully stented with 2.75 Xience. CTO Cx successful PTCA and DES of proximal part with 2.25 Atom.     Prior Cardiac Eval:   X)  Cath 2003 and 2006 - chronic LCX occlusion   1) stress test 02/09/07. The type of test was a dobutamine stress perfusion imaging. Cardiac stress testing was negative for chest pain and myocardial ischemia. Myocardial perfusion imaging showed reversible defect(s) involving the inferior wall. Gated EF (%): 55.   2) echocardiogram 01/19/07. Echocardiogram demonstrated normal LV wall motion and ejection fraction, LVH and left atrial enlargement.   3) Carotid Dopplers in July 2009 showed bilateral 50-79% stenosis.   4) Carotids 07/08/08 - 50-79% stenosis bilat   5) Echo 04/03/09 - TDS, moderate LVH, LVEF 65%; moderate LAE, RAE, mild AS (mean gradient 10), mild MR   6) Echo 1/11 - mild LVH, LAE, EF 55%, mild Pulm HTN, mild MR   7) Cath 06/05/09- DES to RCA   8) cath 06/22/09 - LAD 50%, LCX 100%, mid RCA patent stent, distal RCA 40%   9) Echo 06/22/09 - mild LVH, EF 55-60%, mild LAE, mild MR/TR, PASP 60   10) Cath 07/18/09 - patent stent and stable findings; EF 60%    11) Cath 10/16/09 - 85% ulcerated plaque mid RCA successfully stented with 2.75 Xience. CTO Cx successful PTCA and DES of proximal part with 2.25 Atom   12) Echo 01/27/10 - cLVH and normal systolic function, mild-mod MR, mild LAE        No current facility-administered medications on file prior to encounter.     Current Outpatient Prescriptions on File Prior to Encounter   Medication Sig Dispense Refill   ??? potassium chloride (K-DUR, KLOR-CON) 10 mEq tablet Take 1 Tab by mouth daily.  30 Tab  6   ??? insulin aspart (NOVOLOG) 100 unit/mL injection by SubCUTAneous route Before breakfast and dinner.         ??? bumetanide (BUMEX) 2 mg tablet Take 2 mg by mouth daily.       ??? misoprostol (CYTOTEC) 200 mcg tablet Take 1 Tab by mouth two (2) times a day.  60 Tab  3   ??? omeprazole (PRILOSEC) 20 mg capsule Take 20 mg by mouth daily.       ??? simvastatin (ZOCOR) 40 mg tablet Take 20 mg by mouth nightly.       ??? metoprolol (LOPRESSOR) 50 mg tablet Take 1 Tab by mouth two (2) times a day.  60 Tab  6   ??? lisinopril (PRINIVIL, ZESTRIL) 20 mg tablet Take 1 Tab by mouth nightly.  30 Tab  6   ??? terazosin (HYTRIN) 5 mg capsule Take 10 mg by mouth nightly.       ??? aspirin 81 mg tablet Take  by mouth daily.       ??? omega-3 fatty acids-vitamin e (FISH OIL) 1,000 mg Cap Take  by mouth two (2) times a day.       ??? levothyroxine (SYNTHROID) 150 mcg tablet Take  by mouth daily (before breakfast).       ??? nitroglycerin (NITROSTAT) 0.4 mg SL tablet by SubLINGual route every five (5) minutes as needed.       ??? ranitidine hcl (ZANTAC) 150 mg capsule Take 150 mg by mouth two (2) times a day.       ??? albuterol (PROVENTIL HFA, VENTOLIN HFA) 90 mcg/actuation inhaler Take 2 Puffs by inhalation every six (6) hours as needed for Wheezing.  1 Inhaler  1       No Known Allergies      History   Substance Use Topics   ??? Smoking status: Former Smoker --  20 years     Types: Cigarettes     Quit date: 11/09/1978   ??? Smokeless tobacco: Never Used    ??? Alcohol Use: No       Review of Systems:   Constitutional: Negative for fever, chills, and diaphoresis. + tires easily   HEENT: Negative for nosebleeds, congestion, neck pain, tinnitus, and vision changes.   Respiratory: Negative for hemoptysis, sputum production, and wheezing.   Gastrointestinal: Negative for diarrhea, constipation.   Genitourinary: Negative for dysuria, and hematuria.   Musculoskeletal: + right knee pain.   Skin: Negative for rash and itching.   Heme: Does not bruise easily.   Neurological: Negative for speech change and focal weakness.       Physical Exam:   BP 124/68   Pulse 77   Temp 98.2 ??F (36.8 ??C)   Resp 14   Ht 5\' 6"  (1.676 m)   Wt 185 lb 8 oz (84.142 kg)   BMI 29.94 kg/m2   SpO2 98%   Patient appears generally well, mood and affect are appropriate and pleasant.   HEENT: Normocephalic, atraumatic.   Neck Exam: Supple, Left carotid bruit.    Lung Exam: Clear to auscultation, even breath sounds.   Cardiac Exam: Regular rate and rhythm with 2/6 systolic murmur.   Abdomen: Soft, non-tender, normal bowel sounds. No bruits or masses.   Extremities: mild lower extremity edema   Vascular: difficult to palpate dorsalis pedis pulses bilaterally.       Recent Results (from the past 24 hour(s))   POC CHEM8    Collection Time    05/16/11  4:24 PM       Component Value Range    Calcium, ionized (POC) 1.09 (*) 1.12 - 1.32 MMOL/L    Sodium (POC) 135 (*) 136 - 145 MMOL/L    Potassium (POC) 5.2 (*) 3.5 - 5.1 MMOL/L    Chloride (POC) 101  98 - 107 MMOL/L    CO2 (POC) 18 (*) 21 - 32 MMOL/L    Anion gap (POC) 22 (*) 5 - 15 mmol/L    Glucose (POC) 637 (*) 75 - 110 MG/DL    BUN (POC) 38 (*) 9 - 20 MG/DL    Creatinine (POC) 2.2 (*) 0.6 - 1.3 MG/DL    GFR-AA (POC) 36 (*) >60 ml/min/1.74m2    GFR, non-AA (POC) 30 (*) >60 ml/min/1.42m2    Hemoglobin (POC) 11.9 (*) 12.1 - 17.0 GM/DL    Hematocrit (POC) 35 (*) 36.6 - 50.3 %    Comment Comment Not Indicated.     POC TROPONIN-I    Collection Time     05/16/11  4:26 PM       Component Value Range    Troponin-I (POC) 0.33 (*) 0.00 - 0.08 ng/mL   CBC WITH AUTOMATED DIFF    Collection Time    05/16/11  4:30 PM       Component Value Range    WBC 6.5  4.1 - 11.1 K/uL    RBC 3.68 (*) 4.10 - 5.70 M/uL    HGB 11.1 (*) 12.1 - 17.0 g/dL    HCT 04.5 (*) 40.9 - 50.3 %    MCV 89.9  80.0 - 99.0 FL    MCH 30.2  26.0 - 34.0 PG    MCHC 33.5  30.0 - 36.5 g/dL    RDW 81.1 (*) 91.4 - 14.5 %    PLATELET 127 (*) 150 - 400 K/uL    NEUTROPHILS 79 (*) 32 -  75 %    LYMPHOCYTES 14  12 - 49 %    MONOCYTES 7  5 - 13 %    EOSINOPHILS 0  0 - 7 %    BASOPHILS 0  0 - 1 %    ABS. NEUTROPHILS 5.1  1.8 - 8.0 K/UL    ABS. LYMPHOCYTES 0.9  0.8 - 3.5 K/UL    ABS. MONOCYTES 0.4  0.0 - 1.0 K/UL    ABS. EOSINOPHILS 0.0  0.0 - 0.4 K/UL    ABS. BASOPHILS 0.0  0.0 - 0.1 K/UL   PRO-BNP    Collection Time    05/16/11  4:30 PM       Component Value Range    NT pro-BNP 2115 (*) 0 - 125 PG/ML   HEPATIC FUNCTION PANEL    Collection Time    05/16/11  4:30 PM       Component Value Range    Protein, total 8.9 (*) 6.4 - 8.2 g/dL    Albumin 3.7  3.5 - 5.0 g/dL    Globulin 5.2 (*) 2.0 - 4.0 g/dL    A-G Ratio 0.7 (*) 1.1 - 2.2      Bilirubin, total 0.6  0.2 - 1.0 MG/DL    Bilirubin, direct 0.2  0.0 - 0.2 MG/DL    Alk. phosphatase 112  50 - 136 U/L    AST 31  15 - 37 U/L    ALT 23  12 - 78 U/L   MAGNESIUM    Collection Time    05/16/11  4:30 PM       Component Value Range    Magnesium 2.1  1.6 - 2.4 MG/DL   TROPONIN I    Collection Time    05/16/11  4:30 PM       Component Value Range    Troponin-I, Qt. 0.75 (*) <0.05 ng/mL   CK W/ CKMB & INDEX    Collection Time    05/16/11  4:30 PM       Component Value Range    CK - MB 32.8 (*) 0.5 - 3.6 NG/ML    CK-MB Index 10.5 (*) 0 - 2.5      CK 313 (*) 39 - 308 U/L   GLUCOSE, POC    Collection Time    05/16/11  5:52 PM       Component Value Range    POC GLUCOSE >600 (*) 75 - 110 mg/dL    Performed by Schrinel Christell Constant) Chancy Hurter, POC    Collection Time     05/16/11  6:57 PM       Component Value Range    POC GLUCOSE 536 (*) 75 - 110 mg/dL    Performed by Horton Chin Christell Constant) Chancy Hurter, POC    Collection Time    05/16/11  8:06 PM       Component Value Range    POC GLUCOSE 462 (*) 75 - 110 mg/dL    Performed by Toma Copier     GLUCOSE, POC    Collection Time    05/16/11  9:14 PM       Component Value Range    POC GLUCOSE 368 (*) 75 - 110 mg/dL    Performed by Dawna Part     METABOLIC PANEL, COMPREHENSIVE    Collection Time    05/16/11  9:24 PM       Component Value Range    Sodium 135 (*) 136 -  145 MMOL/L    Potassium 4.1  3.5 - 5.1 MMOL/L    Chloride 97  97 - 108 MMOL/L    CO2 25  21 - 32 MMOL/L    Anion gap 13  5 - 15 mmol/L    Glucose 327 (*) 65 - 100 MG/DL    BUN 45 (*) 6 - 20 MG/DL    Creatinine 0.98 (*) 0.45 - 1.15 MG/DL    BUN/Creatinine ratio 16  12 - 20      GFR est AA 28 (*) >60 ml/min/1.18m2    GFR est non-AA 23 (*) >60 ml/min/1.47m2    Calcium 8.7  8.5 - 10.1 MG/DL    Bilirubin, total 0.4  0.2 - 1.0 MG/DL    ALT 21  12 - 78 U/L    AST 47 (*) 15 - 37 U/L    Alk. phosphatase 101  50 - 136 U/L    Protein, total 8.1  6.4 - 8.2 g/dL    Albumin 3.4 (*) 3.5 - 5.0 g/dL    Globulin 4.7 (*) 2.0 - 4.0 g/dL    A-G Ratio 0.7 (*) 1.1 - 2.2     CK W/ CKMB & INDEX    Collection Time    05/16/11  9:24 PM       Component Value Range    CK - MB 45.6 (*) 0.5 - 3.6 NG/ML    CK-MB Index 11.1 (*) 0 - 2.5      CK 410 (*) 39 - 308 U/L   TROPONIN I    Collection Time    05/16/11  9:24 PM       Component Value Range    Troponin-I, Qt. 14.97 (*) <0.05 ng/mL   GLUCOSE, POC    Collection Time    05/16/11 10:18 PM       Component Value Range    POC GLUCOSE 292 (*) 75 - 110 mg/dL    Performed by HENDERSON AMY     GLUCOSE, POC    Collection Time    05/16/11 11:21 PM       Component Value Range    POC GLUCOSE 215 (*) 75 - 110 mg/dL    Performed by Jean Rosenthal     GLUCOSE, POC    Collection Time    05/17/11 12:28 AM       Component Value Range     POC GLUCOSE 238 (*) 75 - 110 mg/dL    Performed by Jean Rosenthal     GLUCOSE, POC    Collection Time    05/17/11  1:31 AM       Component Value Range    POC GLUCOSE 140 (*) 75 - 110 mg/dL    Performed by Dawna Part     TROPONIN I    Collection Time    05/17/11  2:22 AM       Component Value Range    Troponin-I, Qt. 15.78 (*) <0.05 ng/mL   CK W/ CKMB & INDEX    Collection Time    05/17/11  2:22 AM       Component Value Range    CK - MB 42.8 (*) 0.5 - 3.6 NG/ML    CK-MB Index 9.8 (*) 0 - 2.5      CK 435 (*) 39 - 308 U/L   METABOLIC PANEL, BASIC    Collection Time    05/17/11  2:22 AM       Component Value Range    Sodium  137  136 - 145 MMOL/L    Potassium 4.0  3.5 - 5.1 MMOL/L    Chloride 101  97 - 108 MMOL/L    CO2 27  21 - 32 MMOL/L    Anion gap 9  5 - 15 mmol/L    Glucose 84  65 - 100 MG/DL    BUN 45 (*) 6 - 20 MG/DL    Creatinine 1.61 (*) 0.45 - 1.15 MG/DL    BUN/Creatinine ratio 15  12 - 20      GFR est AA 25 (*) >60 ml/min/1.50m2    GFR est non-AA 21 (*) >60 ml/min/1.46m2    Calcium 8.4 (*) 8.5 - 10.1 MG/DL   CBC W/O DIFF    Collection Time    05/17/11  2:22 AM       Component Value Range    WBC 6.1  4.1 - 11.1 K/uL    RBC 3.17 (*) 4.10 - 5.70 M/uL    HGB 9.4 (*) 12.1 - 17.0 g/dL    HCT 09.6 (*) 04.5 - 50.3 %    MCV 88.0  80.0 - 99.0 FL    MCH 29.7  26.0 - 34.0 PG    MCHC 33.7  30.0 - 36.5 g/dL    RDW 40.9 (*) 81.1 - 14.5 %    PLATELET 121 (*) 150 - 400 K/uL   LIPID PANEL    Collection Time    05/17/11  2:22 AM       Component Value Range    LIPID PROFILE          Cholesterol, total 123  <200 MG/DL    Triglyceride 48  <914 MG/DL    HDL Cholesterol 55      LDL, calculated 58.4  0 - 782 MG/DL    VLDL, calculated 9.6      CHOL/HDL Ratio 2.2  0 - 5.0     MAGNESIUM    Collection Time    05/17/11  2:22 AM       Component Value Range    Magnesium 2.1  1.6 - 2.4 MG/DL   PHOSPHORUS    Collection Time    05/17/11  2:22 AM       Component Value Range    Phosphorus 1.8 (*) 2.5 - 4.9 MG/DL   PTT    Collection Time     05/17/11  2:22 AM       Component Value Range    aPTT 28.7  24.0 - 31.5 sec    aPTT, therapeutic range      58.0 - 77.0 SECS   PROTHROMBIN TIME    Collection Time    05/17/11  2:22 AM       Component Value Range    INR 1.2 (*) 0.9 - 1.1      Prothrombin time 13.0 (*) 9.4 - 11.7 sec   HEPATIC FUNCTION PANEL    Collection Time    05/17/11  2:22 AM       Component Value Range    Protein, total 7.4  6.4 - 8.2 g/dL    Albumin 3.1 (*) 3.5 - 5.0 g/dL    Globulin 4.3 (*) 2.0 - 4.0 g/dL    A-G Ratio 0.7 (*) 1.1 - 2.2      Bilirubin, total 0.4  0.2 - 1.0 MG/DL    Bilirubin, direct 0.1  0.0 - 0.2 MG/DL    Alk. phosphatase 92  50 - 136 U/L    AST 54 (*)  15 - 37 U/L    ALT 21  12 - 78 U/L   GLUCOSE, POC    Collection Time    05/17/11  2:35 AM       Component Value Range    POC GLUCOSE 82  75 - 110 mg/dL    Performed by Dawna Part     GLUCOSE, POC    Collection Time    05/17/11  3:39 AM       Component Value Range    POC GLUCOSE 68 (*) 75 - 110 mg/dL    Performed by Jean Rosenthal     GLUCOSE, POC    Collection Time    05/17/11  3:58 AM       Component Value Range    POC GLUCOSE 120 (*) 75 - 110 mg/dL    Performed by Dawna Part     GLUCOSE, POC    Collection Time    05/17/11  5:07 AM       Component Value Range    POC GLUCOSE 94  75 - 110 mg/dL    Performed by Jean Rosenthal     GLUCOSE, POC    Collection Time    05/17/11  6:11 AM       Component Value Range    POC GLUCOSE 94  75 - 110 mg/dL    Performed by Jean Rosenthal     GLUCOSE, POC    Collection Time    05/17/11  7:14 AM       Component Value Range    POC GLUCOSE 93  75 - 110 mg/dL    Performed by Dawna Part     GLUCOSE, POC    Collection Time    05/17/11  8:17 AM       Component Value Range    POC GLUCOSE 136 (*) 75 - 110 mg/dL    Performed by Percell Boston     PTT    Collection Time    05/17/11 10:17 AM       Component Value Range    aPTT 90.2 (*) 24.0 - 31.5 sec    aPTT, therapeutic range      58.0 - 77.0 SECS   TROPONIN I    Collection Time    05/17/11 10:30 AM        Component Value Range    Troponin-I, Qt. 10.75 (*) <0.05 ng/mL   CK W/ CKMB & INDEX    Collection Time    05/17/11 10:30 AM       Component Value Range    CK - MB 44.8 (*) 0.5 - 3.6 NG/ML    CK-MB Index 10.3 (*) 0 - 2.5      CK 434 (*) 39 - 308 U/L   GLUCOSE, POC    Collection Time    05/17/11 11:48 AM       Component Value Range    POC GLUCOSE 425 (*) 75 - 110 mg/dL    Performed by Kermit Balo           Assessment and Plan:  1) NSTEMI   - trop peaked at 15 and is coming down.  Travis [name] is pain free this AM.    - Given NSTEMI would like to proceed with a cath.    - Since Travis [name] renal function looks worse than baseline today and he denies further chest pain, will treat medically for now with plans to cath tentatively Monday if renal function stable.     -  Travis [name] has had problems with GI bleeding, so if he needs a stent, would favor a Bare-Metal Stent.  Follow Hgb and signs of bleeding on heparin.    - Will try to minimize dye use with the cath, avoid nephrotoxins, and gently hydrate pre-cath in order to minimize contrast induced nephropathy.    - continue IV heparin, aspirin, beta-blocker, statin and NTP for medical management of NSTEMI.      2) ARF on CKD - renal following.      3) Diabetes - on an insulin drip    4) Diabetic foot wound - wound care to see, management per Dr. Percival Spanish, MD, [facility]

## 2011-05-17 NOTE — Progress Notes (Addendum)
[facility]  [address] [place], [place] [address]  [id number]      Medical Progress Note      NAME: Travis [name]   DOB:  05-20-1941  MRM:  [id number]    Date/Time: 05/17/2011         Subjective:     Chief Complaint:  Patient was seen and examined by me.  Chart reviewed.  AG closed.  Now off insulin gtt.  Trop cont to rise.  Denied chest pain       Objective:       Vitals:       Last 24hrs VS reviewed since prior progress note. Most recent are:    Visit Vitals   Item Reading   ??? BP 118/49   ??? Pulse 68   ??? Temp 98 ??F (36.7 ??C)   ??? Resp 13   ??? Ht 5\' 6"  (1.676 m)   ??? Wt 185 lb 8 oz   ??? BMI 29.94 kg/m2   ??? SpO2 98%     SpO2 Readings from Last 6 Encounters:   05/17/11 98%   11/13/09 98%   11/13/09 98%   11/13/09 98%   10/17/09 100%   10/15/09 98%    O2 Flow Rate (L/min): 2 l/min     Intake/Output Summary (Last 24 hours) at 05/17/11 2130  Last data filed at 05/17/11 0038   Gross per 24 hour   Intake    240 ml   Output    200 ml   Net     40 ml        Exam:     Physical Exam:    Gen: Well-developed, well-nourished, in no acute distress   HEENT: Pink conjunctivae, PERRL, hearing intact to voice, moist mucous membranes   Neck: Supple, without masses, thyroid non-tender   Resp: No accessory muscle use, trace crackles at bases   Card: No murmurs, normal S1, S2 without thrills, 1+ edema   Abd: Soft, non-tender, non-distended, normoactive bowel sounds are present, no palpable organomegaly and no detectable hernias   Lymph: No cervical adenopathy   Musc: No cyanosis or clubbing   Skin: left toe wound (POA)  Neuro: Cranial nerves 3-12 are grossly intact, follows commands appropriately   Psych: Alert with good insight. Oriented to person, place, and time    Medications Reviewed: (see below)    Lab Data Reviewed: (see below)    ______________________________________________________________________    Medications:     Current Facility-Administered Medications   Medication Dose Route Frequency    ??? insulin glargine (LANTUS) injection 30 Units  30 Units SubCUTAneous ACB   ??? insulin lispro (HUMALOG) injection   SubCUTAneous AC&HS   ??? glucose chewable tablet 16 g  4 Tab Oral PRN   ??? dextrose (D50W) injection Syrg 12.5-25 g  12.5-25 g IntraVENous PRN   ??? glucagon (GLUCAGEN) injection 1 mg  1 mg IntraMUSCular PRN   ??? ondansetron (ZOFRAN) injection 4 mg  4 mg IntraVENous NOW   ??? aspirin (ASPIRIN) tablet 325 mg  325 mg Oral NOW   ??? nitroglycerin (NITROBID) 2 % ointment 0.5 Inch  0.5 Inch Topical BID   ??? insulin regular (NOVOLIN, HUMULIN) injection 10 Units  10 Units IntraVENous NOW   ??? furosemide (LASIX) injection 40 mg  40 mg IntraVENous ONCE   ??? aspirin chewable tablet 81 mg  81 mg Oral DAILY   ??? bumetanide (BUMEX) tablet 2 mg  2 mg Oral  DAILY   ??? levothyroxine (SYNTHROID) tablet 150 mcg  150 mcg Oral ACB   ??? lisinopril (PRINIVIL, ZESTRIL) tablet 20 mg  20 mg Oral QHS   ??? metoprolol (LOPRESSOR) tablet 50 mg  50 mg Oral BID   ??? misoprostol (CYTOTEC) tablet 200 mcg  200 mcg Oral BID WITH MEALS   ??? simvastatin (ZOCOR) tablet 20 mg  20 mg Oral QHS   ??? terazosin (HYTRIN) capsule 10 mg  10 mg Oral QHS   ??? 0.9% sodium chloride infusion  50 mL/hr IntraVENous CONTINUOUS   ??? SALINE PERIPHERAL FLUSH Q8H Soln 5 mL  5 mL InterCATHeter Q8H   ??? saline peripheral flush Soln 5 mL  5 mL InterCATHeter PRN   ??? acetaminophen (TYLENOL) tablet 650 mg  650 mg Oral Q6H PRN   ??? acetaminophen (TYLENOL) suppository 650 mg  650 mg Rectal Q4H PRN   ??? oxyCODONE-acetaminophen (PERCOCET) 5-325 mg per tablet 1 Tab  1 Tab Oral Q6H PRN   ??? morphine injection 2 mg  2 mg IntraVENous Q4H PRN   ??? naloxone (NARCAN) injection 0.4 mg  0.4 mg IntraVENous PRN   ??? acetaminophen (TYLENOL) tablet 650 mg  650 mg Oral Q6H PRN   ??? diphenhydrAMINE (BENADRYL) capsule 25 mg  25 mg Oral Q4H PRN   ??? ondansetron (ZOFRAN) injection 4 mg  4 mg IntraVENous Q4H PRN   ??? magnesium hydroxide (MILK OF MAGNESIA) oral suspension 30 mL  30 mL Oral DAILY PRN    ??? docusate sodium (COLACE) capsule 100 mg  100 mg Oral BID   ??? bisacodyl (DULCOLAX) suppository 10 mg  10 mg Rectal DAILY PRN   ??? glucose chewable tablet 16 g  4 Tab Oral PRN   ??? dextrose (D50W) injection Syrg 12.5-25 g  12.5-25 g IntraVENous PRN   ??? glucagon (GLUCAGEN) injection 1 mg  1 mg IntraMUSCular PRN   ??? pantoprazole (PROTONIX) tablet 40 mg  40 mg Oral ACB   ??? promethazine (PHENERGAN) 12.5 mg in NS 50 ml IVPB  12.5 mg IntraVENous Q8H PRN   ??? famotidine (PEPCID) tablet 20 mg  20 mg Oral ACB&D   ??? heparin 25,000 units in D5W 250 ml infusion  12-25 Units/kg/hr IntraVENous TITRATE          Lab Review:     Recent Labs   Basename 05/17/11 0222 05/16/11 1630    WBC 6.1 6.5    HGB 9.4* 11.1*    HCT 27.9* 33.1*    PLT 121* 127*     Recent Labs   [facility] 05/17/11 0222 05/16/11 2124 05/16/11 1630    NA 137 135* --    K 4.0 4.1 --    CL 101 97 --    CO2 27 25 --    GLU 84 327* --    BUN 45* 45* --    CREA 2.98* 2.73* --    CA 8.4* 8.7 --    MG 2.1 -- 2.1    PHOS 1.8* -- --    ALB 3.1* 3.4* 3.7    TBIL 0.4 0.4 0.6    SGOT 54* 47* 31    INR 1.2* -- --     Lab Results   Component Value Date/Time    POC GLUCOSE 136 05/17/2011  8:17 AM    POC GLUCOSE 93 05/17/2011  7:14 AM    POC GLUCOSE 94 05/17/2011  6:11 AM    POC GLUCOSE 94 05/17/2011  5:07 AM    POC GLUCOSE  120 05/17/2011  3:58 AM          Assessment / Plan:     70 yo hx of CAD s/p stents, DM, CKD 3, diastolic CHF, presented w/ NSTEMI, DKA     1) NSTEMI (non-ST elevated myocardial infarction)/Chest pain, unspecified: Trop cont to rise. Cont IVCU care. Cont O2, morphine, ASA, nitro, BB, heparin gtt.  Follows cardiac enzymes. Cards following, plan for cath Monday    2) DKA, type 2, not at goal: due to medical non-compliance. Has been followed by Dr. Angelina Ok, endocrine, but has not seen her in a while.  DKA improved with insulin gtt.  Will resume home Lantus, SSI.  Checking A1C.  Endocrine and DTC to see     3) Acute renal failure (ARF)/CKD 3: likely due dehydration. Cont gently IVF. Must balance between volume depletion due to DKA and CHF. Patient is followed by Dr. Sherrine Maples.  Might need mucomyst if cath is planned.  Renal to see    4) Acute on chronic diastolic congestive heart failure: Last echo in 2011 with EF of 60%. BNP 2,000 on admission w/ pulm edema on CXR. Given renal function, will give IV diuretics prn. Cont home bumex, lisinopril. Hold both if renal function is worse     5) Unspecified hypothyroidism: cont synthroid     6) Left foot wound: consult wound care.  Xray and arterial doppler pending    Full Code    Total time spent with patient: 23 Minutes                  Care Plan discussed with: Patient    Discussed:  Care Plan    Prophylaxis:  Hep SQ    Disposition:  Home w/Family           ___________________________________________________    Attending Physician: Twanna Hy. Hildred Mollica, MD

## 2011-05-18 LAB — PHOSPHORUS: Phosphorus: 3.8 MG/DL (ref 2.5–4.9)

## 2011-05-18 LAB — PTT
aPTT: 72.1 s — ABNORMAL HIGH (ref 24.0–31.5)
aPTT: 84.9 s — ABNORMAL HIGH (ref 24.0–31.5)
aPTT: 62.3 s — ABNORMAL HIGH (ref 24.0–31.5)

## 2011-05-18 LAB — CK W/ CKMB & INDEX
CK - MB: 27.6 NG/ML — ABNORMAL HIGH (ref 0.5–3.6)
CK-MB Index: 8.2 — ABNORMAL HIGH (ref 0–2.5)
CK: 335 U/L — ABNORMAL HIGH (ref 39–308)

## 2011-05-18 LAB — CBC W/O DIFF
HCT: 29.4 % — ABNORMAL LOW (ref 36.6–50.3)
HGB: 9.8 g/dL — ABNORMAL LOW (ref 12.1–17.0)
MCH: 29.3 PG (ref 26.0–34.0)
MCHC: 33.3 g/dL (ref 30.0–36.5)
MCV: 88 FL (ref 80.0–99.0)
PLATELET: 133 10*3/uL — ABNORMAL LOW (ref 150–400)
RBC: 3.34 M/uL — ABNORMAL LOW (ref 4.10–5.70)
RDW: 15.2 % — ABNORMAL HIGH (ref 11.5–14.5)
WBC: 7.2 10*3/uL (ref 4.1–11.1)

## 2011-05-18 LAB — METABOLIC PANEL, BASIC
Anion gap: 10 mmol/L (ref 5–15)
BUN/Creatinine ratio: 14 (ref 12–20)
BUN: 55 MG/DL — ABNORMAL HIGH (ref 6–20)
CO2: 25 MMOL/L (ref 21–32)
Calcium: 8.7 MG/DL (ref 8.5–10.1)
Chloride: 96 MMOL/L — ABNORMAL LOW (ref 97–108)
Creatinine: 3.91 MG/DL — ABNORMAL HIGH (ref 0.45–1.15)
GFR est AA: 19 mL/min/{1.73_m2} — ABNORMAL LOW (ref >60)
GFR est non-AA: 15 mL/min/{1.73_m2} — ABNORMAL LOW (ref >60)
Glucose: 210 MG/DL — ABNORMAL HIGH (ref 65–100)
Potassium: 4.4 MMOL/L (ref 3.5–5.1)
Sodium: 131 MMOL/L — ABNORMAL LOW (ref 136–145)

## 2011-05-18 LAB — RENAL FUNCTION PANEL
Albumin: 3 g/dL — ABNORMAL LOW (ref 3.5–5.0)
Anion gap: 8 mmol/L (ref 5–15)
BUN/Creatinine ratio: 16 (ref 12–20)
BUN: 49 MG/DL — ABNORMAL HIGH (ref 6–20)
CO2: 28 MMOL/L (ref 21–32)
Calcium: 8.6 MG/DL (ref 8.5–10.1)
Chloride: 97 MMOL/L (ref 97–108)
Creatinine: 3.14 MG/DL — ABNORMAL HIGH (ref 0.45–1.15)
GFR est AA: 24 mL/min/{1.73_m2} — ABNORMAL LOW (ref >60)
GFR est non-AA: 20 mL/min/{1.73_m2} — ABNORMAL LOW (ref >60)
Glucose: 236 MG/DL — ABNORMAL HIGH (ref 65–100)
Phosphorus: 3.4 MG/DL (ref 2.5–4.9)
Potassium: 4.2 MMOL/L (ref 3.5–5.1)
Sodium: 133 MMOL/L — ABNORMAL LOW (ref 136–145)

## 2011-05-18 LAB — GLUCOSE, POC
Glucose (POC): 254 mg/dL — ABNORMAL HIGH (ref 75–110)
Glucose (POC): 212 mg/dL — ABNORMAL HIGH (ref 75–110)
Glucose (POC): 261 mg/dL — ABNORMAL HIGH (ref 75–110)
Glucose (POC): 181 mg/dL — ABNORMAL HIGH (ref 75–110)

## 2011-05-18 LAB — TROPONIN I: Troponin-I, Qt.: 5.3 ng/mL — ABNORMAL HIGH (ref <0.05)

## 2011-05-18 LAB — MAGNESIUM: Magnesium: 2.1 MG/DL (ref 1.6–2.4)

## 2011-05-18 MED ORDER — SILVER SULFADIAZINE 1 % TOPICAL CREAM
1 % | Freq: Two times a day (BID) | CUTANEOUS | Status: DC
Start: 2011-05-18 — End: 2011-05-24
  Administered 2011-05-18 – 2011-05-24 (×15): via TOPICAL

## 2011-05-18 MED ORDER — NITROGLYCERIN 2 % TRANSDERMAL OINTMENT
2 % | Freq: Two times a day (BID) | TRANSDERMAL | Status: DC
Start: 2011-05-18 — End: 2011-05-22
  Administered 2011-05-19 – 2011-05-22 (×6): via TOPICAL

## 2011-05-18 MED ORDER — INSULIN LISPRO 100 UNIT/ML INJECTION
100 unit/mL | Freq: Three times a day (TID) | SUBCUTANEOUS | Status: DC
Start: 2011-05-18 — End: 2011-05-19
  Administered 2011-05-18 (×2): via SUBCUTANEOUS

## 2011-05-18 NOTE — Progress Notes (Signed)
Travis [name] J. Milbert Coulter, MD [facility]  [address] [place], [place] [address]    Office [phone]  Fax [phone]  Cell [phone]                    05/18/2011     Admit Date: 05/16/2011    Admit Diagnosis: NSTEMI (non-ST elevated myocardial infarction)    Principal Problem:   *NSTEMI (non-ST elevated myocardial infarction) (05/16/2011)  Active Problems:     Chronic kidney disease, stage III (moderate) (09/29/2009)     DKA, type 2, not at goal (05/16/2011)     Acute renal failure (ARF) (05/16/2011)     Unspecified hypothyroidism (05/16/2011)     PAD (peripheral artery disease) (05/18/2011)   PVR tracing 05/18/2011 suggest bilateral SFA occlusion and calf vessel    disease    and high grade stenosis of his proximal fem-pop         Assessment/Plan:  No recurrent ischemic sx or evidence of decompensated HF. Concerned about worsening renal function. Has significant PAD with rest pain.  Will plan for cath Monday if stability of renal function established. High risk for ARF. Continue ACS Rx     Subjective:  No recurrent chest pain. No dyspnea at rest. Patient denies any exertional chest pain, dyspnea, palpitations, syncope, orthopnea, edema or paroxysmal nocturnal dyspnea. + rest pain left leg            Travis Leavens, MD    Objective:   BP 117/54   Pulse 91   Temp 98.5 ??F (36.9 ??C)   Resp 16   Ht 5\' 6"  (1.676 m)   Wt 188 lb 1.6 oz (85.322 kg)   BMI 30.36 kg/m2   SpO2 92%  Telemetry: normal sinus rhythm    Physical Exam:  Neck: supple, symmetrical, trachea midline, no adenopathy, thyroid: not enlarged, symmetric, no tenderness/mass/nodules and no JVD  Heart: regular rate and rhythm, S1, S2 normal, no murmur, click, rub or gallop  Lungs: clear to auscultation bilaterally  Abdomen: soft, non-tender. Bowel sounds normal. No masses,  no organomegaly  Extremities: no edema  Neurologic: Grossly normal      Labs:    Recent Labs   [facility] 05/18/11 0300    CPK 335*    CPKMB 27.6*    TROIQ 5.30*      Recent Labs   Basename 05/18/11 0300    NA 131*    K 4.4    CL 96*    BUN 55*    CREA 3.91*    GLU 210*    PHOS 3.8    CA 8.7     Recent Labs   Basename 05/18/11 0300    WBC 7.2    HGB 9.8*    HCT 29.4*    PLT 133*     Recent Labs   Basename 05/17/11 0222    TGL 48    CHOL 123    HDLC 55    LDLC 58.4

## 2011-05-18 NOTE — Progress Notes (Signed)
Bedside and Verbal shift change report given to RLahn, RN (oncoming nurse) by KMiller, RN (offgoing nurse).  Report given with SBAR, Kardex and MAR.

## 2011-05-18 NOTE — Progress Notes (Signed)
Left toe dressing done per order. Pt tolerated procedure without adverse reactions.     1330 - out of bed to chair earlier. Wants to stay up in chair.

## 2011-05-18 NOTE — Procedures (Addendum)
[facility]  *** AMENDED REPORT ***    Name: OTIS, PORTAL  MRN: [id number]  DOB: Aug 22, 1940  Visit: [id number]  Date: 17 May 2011    TYPE OF TEST: Peripheral Arterial Testing    Right Leg  Segmentals: Abnormal                     mmHg  Brachial         154  High thigh         0  Low thigh         72  Calf              61  Posterior tibial  59  Dorsalis pedis    53  Peroneal  Metatarsal  Toe pressure  Doppler:    Abnormal  PVR:        Abnormal  Ankle/Brachial: 0.38    Left Leg  Segmentals: Abnormal                     mmHg  Brachial         148  High thigh  Low thigh        207  Calf              51  Posterior tibial  52  Dorsalis pedis     0  Peroneal           0  Metatarsal  Toe pressure  Doppler:    Abnormal  PVR:        Abnormal  Ankle/Brachial: 0.34    AMENDED INTERPRETATION/FINDINGS  1. Severe peripheral arterial disease in bilateral lower extremities.  2. The right ankle/brachial index is 0.38 and the left ankle/brachial  index is 0.34.  3.  Doppoler velocity of the arterial flow at the proximal anastomosis   of the fem-pop bypass graft is 335.5/124.9.  The graft velocities are   32.5 cm/sec at mid thigh, 26 cm/sec at distal thigh, and 17.5 cm/sec  at the distal anastomosis.    AMENDED COMMENTS  PVR tracing suggest bilateral SFA occlusion and calf vessel disease  and high grade stenosis of his proximal fem-pop.  He is s/p angioplasty /stent 07/2010 by Dr Carmell Austria, vascular consult  recommended.    I have personally reviewed the data relevant to the interpretation of  this study.    TECHNOLOGIST:  Frutoso Schatz. Topper, RDMS, RVT    PHYSICIAN:  Ross Marcus, MD  Signed: 05/18/2011 09:07 AM

## 2011-05-18 NOTE — Progress Notes (Signed)
[facility]  [address] [name], [place] [address]  [phone]      Medical Progress Note      NAME: Travis [name]   DOB:  1940/07/03  MRM:  [id number]    Date/Time: 05/18/2011         Subjective:     Chief Complaint:  Patient was seen and examined by me.  Chart reviewed.  AG closed.  Denied chest pain. C/o bilateral LE pain       Objective:       Vitals:       Last 24hrs VS reviewed since prior progress note. Most recent are:    Visit Vitals   Item Reading   ??? BP 117/54   ??? Pulse 91   ??? Temp 98.5 ??F (36.9 ??C)   ??? Resp 16   ??? Ht 5\' 6"  (1.676 m)   ??? Wt 188 lb 1.6 oz   ??? BMI 30.36 kg/m2   ??? SpO2 92%     SpO2 Readings from Last 6 Encounters:   05/18/11 92%   11/13/09 98%   11/13/09 98%   11/13/09 98%   10/17/09 100%   10/15/09 98%    O2 Flow Rate (L/min): 2 l/min       Intake/Output Summary (Last 24 hours) at 05/18/11 0756  Last data filed at 05/18/11 4259   Gross per 24 hour   Intake    120 ml   Output    975 ml   Net   -855 ml        Exam:     Physical Exam:    Gen: Well-developed, well-nourished, in no acute distress   HEENT: Pink conjunctivae, PERRL, hearing intact to voice, moist mucous membranes   Neck: Supple, without masses, thyroid non-tender   Resp: No accessory muscle use, CTAB   Card: No murmurs, normal S1, S2 without thrills, 1+ edema, poor pulses  Abd: Soft, non-tender, non-distended, normoactive bowel sounds are present, no palpable organomegaly and no detectable hernias   Lymph: No cervical adenopathy   Musc: No cyanosis or clubbing   Skin: left toe wound (POA)  Neuro: Cranial nerves 3-12 are grossly intact, follows commands appropriately   Psych: Alert with good insight. Oriented to person, place, and time    Medications Reviewed: (see below)    Lab Data Reviewed: (see below)    ______________________________________________________________________    Medications:     Current Facility-Administered Medications   Medication Dose Route Frequency    ??? nitroglycerin (NITROBID) 2 % ointment 1 Inch  1 Inch Topical BID   ??? insulin glargine (LANTUS) injection 20 Units  20 Units SubCUTAneous ACB   ??? insulin lispro (HUMALOG) injection   SubCUTAneous AC&HS   ??? insulin lispro (HUMALOG) injection 5 Units  5 Units SubCUTAneous TIDAC   ??? sodium hypochlorite (1/4 STRENGTH DAKINS) irrigation (bottle)   Topical DAILY   ??? pantothenic ac-min oil-pet,hyd (AQUAPHOR) 41 % ointment   Topical TID   ??? aspirin chewable tablet 81 mg  81 mg Oral DAILY   ??? levothyroxine (SYNTHROID) tablet 150 mcg  150 mcg Oral ACB   ??? metoprolol (LOPRESSOR) tablet 50 mg  50 mg Oral BID   ??? misoprostol (CYTOTEC) tablet 200 mcg  200 mcg Oral BID WITH MEALS   ??? simvastatin (ZOCOR) tablet 20 mg  20 mg Oral QHS   ??? terazosin (HYTRIN) capsule 10 mg  10 mg Oral QHS   ??? 0.9% sodium  chloride infusion  50 mL/hr IntraVENous CONTINUOUS   ??? SALINE PERIPHERAL FLUSH Q8H Soln 5 mL  5 mL InterCATHeter Q8H   ??? saline peripheral flush Soln 5 mL  5 mL InterCATHeter PRN   ??? acetaminophen (TYLENOL) tablet 650 mg  650 mg Oral Q6H PRN   ??? acetaminophen (TYLENOL) suppository 650 mg  650 mg Rectal Q4H PRN   ??? oxyCODONE-acetaminophen (PERCOCET) 5-325 mg per tablet 1 Tab  1 Tab Oral Q6H PRN   ??? morphine injection 2 mg  2 mg IntraVENous Q4H PRN   ??? naloxone (NARCAN) injection 0.4 mg  0.4 mg IntraVENous PRN   ??? acetaminophen (TYLENOL) tablet 650 mg  650 mg Oral Q6H PRN   ??? diphenhydrAMINE (BENADRYL) capsule 25 mg  25 mg Oral Q4H PRN   ??? ondansetron (ZOFRAN) injection 4 mg  4 mg IntraVENous Q4H PRN   ??? magnesium hydroxide (MILK OF MAGNESIA) oral suspension 30 mL  30 mL Oral DAILY PRN   ??? docusate sodium (COLACE) capsule 100 mg  100 mg Oral BID   ??? bisacodyl (DULCOLAX) suppository 10 mg  10 mg Rectal DAILY PRN   ??? glucose chewable tablet 16 g  4 Tab Oral PRN   ??? dextrose (D50W) injection Syrg 12.5-25 g  12.5-25 g IntraVENous PRN   ??? glucagon (GLUCAGEN) injection 1 mg  1 mg IntraMUSCular PRN    ??? pantoprazole (PROTONIX) tablet 40 mg  40 mg Oral ACB   ??? promethazine (PHENERGAN) 12.5 mg in NS 50 ml IVPB  12.5 mg IntraVENous Q8H PRN   ??? famotidine (PEPCID) tablet 20 mg  20 mg Oral ACB&D   ??? heparin 25,000 units in D5W 250 ml infusion  12-25 Units/kg/hr IntraVENous TITRATE          Lab Review:     Recent Labs   Basename 05/18/11 0300 05/17/11 0222 05/16/11 1630    WBC 7.2 6.1 6.5    HGB 9.8* 9.4* 11.1*    HCT 29.4* 27.9* 33.1*    PLT 133* 121* 127*     Recent Labs   Basename 05/18/11 0300 05/17/11 0222 05/16/11 2124 05/16/11 1630    NA 131* 137 135* --    K 4.4 4.0 4.1 --    CL 96* 101 97 --    CO2 25 27 25  --    GLU 210* 84 327* --    BUN 55* 45* 45* --    CREA 3.91* 2.98* 2.73* --    CA 8.7 8.4* 8.7 --    MG 2.1 2.1 -- 2.1    PHOS 3.8 1.8* -- --    ALB -- 3.1* 3.4* 3.7    TBIL -- 0.4 0.4 0.6    SGOT -- 54* 47* 31    INR -- 1.2* -- --     Lab Results   Component Value Date/Time    POC GLUCOSE 254 05/17/2011  9:08 PM    POC GLUCOSE 449 05/17/2011  5:58 PM    POC GLUCOSE 425 05/17/2011 11:48 AM    POC GLUCOSE 136 05/17/2011  8:17 AM    POC GLUCOSE 93 05/17/2011  7:14 AM          Assessment / Plan:     70 yo hx of CAD s/p stents, DM, CKD 3, diastolic CHF, presented w/ NSTEMI, DKA     1) NSTEMI (non-ST elevated myocardial infarction)/Chest pain, unspecified: Trop cont to rise. Cont IVCU care. Cont O2, morphine, ASA, nitro, BB, heparin gtt.  Follows  cardiac enzymes. Cards following, plan for cath Monday    2) DKA, type 2, not at goal: due to medical non-compliance. Had been followed by Dr. Angelina Ok, endocrine, but has not seen her in a while.  DKA improved with insulin gtt.  Cont Lantus at 20u, premeal aspart, increase prn.  A1C pending.  Endocrine and DTC already evaluated the patient. He will need to f/u with Dr. Angelina Ok on discharge     3) Acute renal failure (ARF)/CKD 3: likely due dehydration and progression of CKD. Cont gently IVF. Will hold bumex and lisinopril.  Renal following.  Might need mucomyst if cath is planned.      4) Acute on chronic diastolic congestive heart failure: Last echo in 2011 with EF of 60%. BNP 2,000 on admission w/ pulm edema on CXR. Given poor renal function, will give IV diuretics prn.  Holding home bumex, lisinopril     5) Unspecified hypothyroidism: cont synthroid     6) Left foot wound: wound care following.  Xray neg for osteo    7) PVD/leg pain: ABI of 0.34 on right and 0.38 on left.  Vascular following. Cont pain control    Full Code    Total time spent with patient: 35 Minutes                  Care Plan discussed with: Patient    Discussed:  Care Plan    Prophylaxis:  Hep SQ    Disposition:  Home w/Family           ___________________________________________________    Attending Physician: Twanna Hy. Donneisha Beane, MD

## 2011-05-18 NOTE — Consults (Signed)
Vascular Consult    NAME: Travis [name]   DOB:  09/27/1940   MRN:  [id number]     Date/Time:  05/18/2011   Patient PCP: Sol Blazing, MD  Current Attending: Dayna Barker, MD    I have been asked to see this patient by Dr. Dayna Barker, MD, for advice/opinion re: PAD, left leg rest pain..      Assessment:    Hospital Problems  Date Reviewed: May 28, 2011      Codes Class Noted POA    PAD (peripheral artery disease) 447.9  05/18/2011 Unknown    Overview        Signed 05/18/2011  9:58 AM by Doreene Adas, MD     PVR tracing 05/18/2011 suggest bilateral SFA occlusion and calf vessel disease   and high grade stenosis of his proximal fem-pop            Ischemic toe ulcer 707.15  05/18/2011 Unknown    Overview        Signed 05/18/2011 10:15 AM by Davy Pique, MD     Left first toe          *NSTEMI (non-ST elevated myocardial infarction) 410.70  28-May-2011 Yes        DKA, type 2, not at goal (Chronic) 250.12  28-May-2011 Yes        Acute renal failure (ARF) 584.9  2011-05-28 Yes        Unspecified hypothyroidism (Chronic) 244.9  May 28, 2011 Yes        Chronic kidney disease, stage III (moderate) (Chronic) 585.3  09/29/2009 Yes              Recommendations and Plan:  Continue Heparin. Optimizing his renal function and then dealing with his new MI obviously will take precendent.  Will review his outside angios of this spring.   Topical wound care for left toe ulcer with Silvadene.      Subjective:      Patient is a 70 y.o., male, who presents for evaluation of chest pain and new MI. He has seen podiatry for recent onset of increasing left foot pain, mostly of his first toe, He had partial removal of his first  Toenail with only modest improvement. He has a history of advanced PAD and is s/p right fem-pop bypass in the distant past at the [place]. I did a  PTA/Stent of a proximal in stent stenosis at [facility] in spring of this year. Today's PVR's suggest recurrent stenosis and bilateral advanced disease involving both his femoral, popliteal and infrapopliteal segments. His toe films were unremarkable.        Past Medical History   Diagnosis Date   ??? CAD (coronary artery disease)      see problem list   ??? HTN (hypertension)    ??? PVD (peripheral vascular disease)    ??? DM (diabetes mellitus)    ??? Hyperlipidemia    ??? Sleep apnea      on CPAP   ??? COPD (chronic obstructive pulmonary disease)      mild COPD, recurrent pneumonia, chronic bronchitis (followed by Dr. Leroy Libman)   ??? CKD (chronic kidney disease)      Cr 1.7 in 10/09   ??? CHF (congestive heart failure)    ??? Anemia    ??? Pulmonary hypertension      PASP 60 on echo 06/22/09   ??? Thromboembolus      leg    ??? Thyroid disease    ??? Pneumonia    ???  GERD (gastroesophageal reflux disease)    ??? Chronic pain      back pain   ??? Cataract    ??? GI bleed      multiple bleeding episodes and blood transfusions.  Dr. Monika Salk has followed..  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      Past Surgical History   Procedure Date   ??? Hx orthopaedic      knee replacement   ??? Pr vascular surgery procedure unlist      arterial bypass   ??? Hx pacemaker    ??? Pr cardiac surg procedure unlist      cardiac stent   ??? Hx cataract removal      removed rt eye      Prior to Admission medications    Medication Sig Start Date End Date Taking? Authorizing Provider   insulin glargine (LANTUS) 100 unit/mL injection 30 Units by SubCUTAneous route daily.     Yes Historical Provider    potassium chloride (K-DUR, KLOR-CON) 10 mEq tablet Take 1 Tab by mouth daily. 02/05/11  Yes Laddie Aquas, MD   insulin aspart (NOVOLOG) 100 unit/mL injection by SubCUTAneous route Before breakfast and dinner.     Yes Historical Provider   bumetanide (BUMEX) 2 mg tablet Take 2 mg by mouth daily.   Yes Historical Provider   misoprostol (CYTOTEC) 200 mcg tablet Take 1 Tab by mouth two (2) times a day. 01/26/10  Yes Laddie Aquas, MD   omeprazole (PRILOSEC) 20 mg capsule Take 20 mg by mouth daily.   Yes Historical Provider   simvastatin (ZOCOR) 40 mg tablet Take 20 mg by mouth nightly.   Yes Historical Provider   metoprolol (LOPRESSOR) 50 mg tablet Take 1 Tab by mouth two (2) times a day. 11/13/09  Yes Laddie Aquas, MD   lisinopril (PRINIVIL, ZESTRIL) 20 mg tablet Take 1 Tab by mouth nightly. 11/13/09  Yes Laddie Aquas, MD   terazosin (HYTRIN) 5 mg capsule Take 10 mg by mouth nightly.   Yes Historical Provider   aspirin 81 mg tablet Take  by mouth daily.   Yes Historical Provider   omega-3 fatty acids-vitamin e (FISH OIL) 1,000 mg Cap Take  by mouth two (2) times a day.   Yes Historical Provider   levothyroxine (SYNTHROID) 150 mcg tablet Take  by mouth daily (before breakfast).   Yes Historical Provider   nitroglycerin (NITROSTAT) 0.4 mg SL tablet by SubLINGual route every five (5) minutes as needed.   Yes Historical Provider   ranitidine hcl (ZANTAC) 150 mg capsule Take 150 mg by mouth two (2) times a day.   Yes Historical Provider   albuterol (PROVENTIL HFA, VENTOLIN HFA) 90 mcg/actuation inhaler Take 2 Puffs by inhalation every six (6) hours as needed for Wheezing. 03/11/11   Laddie Aquas, MD       Current Facility-Administered Medications   Medication Dose Route Frequency   ??? nitroglycerin (NITROBID) 2 % ointment 1 Inch  1 Inch Topical BID   ??? insulin lispro (HUMALOG) injection 7 Units  7 Units SubCUTAneous TIDAC   ??? insulin glargine (LANTUS) injection 20 Units  20 Units SubCUTAneous ACB    ??? sodium hypochlorite (1/4 STRENGTH DAKINS) irrigation (bottle)   Topical DAILY   ??? pantothenic ac-min oil-pet,hyd (AQUAPHOR) 41 % ointment   Topical TID   ??? aspirin chewable tablet 81 mg  81 mg Oral DAILY   ??? levothyroxine (SYNTHROID) tablet 150 mcg  150 mcg Oral ACB   ??? metoprolol (LOPRESSOR) tablet 50  mg  50 mg Oral BID   ??? misoprostol (CYTOTEC) tablet 200 mcg  200 mcg Oral BID WITH MEALS   ??? simvastatin (ZOCOR) tablet 20 mg  20 mg Oral QHS   ??? terazosin (HYTRIN) capsule 10 mg  10 mg Oral QHS   ??? 0.9% sodium chloride infusion  50 mL/hr IntraVENous CONTINUOUS   ??? SALINE PERIPHERAL FLUSH Q8H Soln 5 mL  5 mL InterCATHeter Q8H   ??? saline peripheral flush Soln 5 mL  5 mL InterCATHeter PRN   ??? acetaminophen (TYLENOL) tablet 650 mg  650 mg Oral Q6H PRN   ??? acetaminophen (TYLENOL) suppository 650 mg  650 mg Rectal Q4H PRN   ??? oxyCODONE-acetaminophen (PERCOCET) 5-325 mg per tablet 1 Tab  1 Tab Oral Q6H PRN   ??? morphine injection 2 mg  2 mg IntraVENous Q4H PRN   ??? naloxone (NARCAN) injection 0.4 mg  0.4 mg IntraVENous PRN   ??? acetaminophen (TYLENOL) tablet 650 mg  650 mg Oral Q6H PRN   ??? diphenhydrAMINE (BENADRYL) capsule 25 mg  25 mg Oral Q4H PRN   ??? ondansetron (ZOFRAN) injection 4 mg  4 mg IntraVENous Q4H PRN   ??? magnesium hydroxide (MILK OF MAGNESIA) oral suspension 30 mL  30 mL Oral DAILY PRN   ??? docusate sodium (COLACE) capsule 100 mg  100 mg Oral BID   ??? bisacodyl (DULCOLAX) suppository 10 mg  10 mg Rectal DAILY PRN   ??? glucose chewable tablet 16 g  4 Tab Oral PRN   ??? dextrose (D50W) injection Syrg 12.5-25 g  12.5-25 g IntraVENous PRN   ??? glucagon (GLUCAGEN) injection 1 mg  1 mg IntraMUSCular PRN   ??? pantoprazole (PROTONIX) tablet 40 mg  40 mg Oral ACB   ??? promethazine (PHENERGAN) 12.5 mg in NS 50 ml IVPB  12.5 mg IntraVENous Q8H PRN   ??? famotidine (PEPCID) tablet 20 mg  20 mg Oral ACB&D   ??? heparin 25,000 units in D5W 250 ml infusion  12-25 Units/kg/hr IntraVENous TITRATE       No Known Allergies   History    Substance Use Topics   ??? Smoking status: Former Smoker -- 20 years     Types: Cigarettes     Quit date: 11/09/1978   ??? Smokeless tobacco: Never Used   ??? Alcohol Use: No      Family History   Problem Relation Age of Onset   ??? Hypertension     ??? Cancer Mother      cervical and colon   ??? Cancer Father      lung        Review of Systems  (bold if positive, if negative)    Gen:  Eyes:  ENT:  CVS:  chest pain,Pulm:  GI:    GU:    MS:  Pain left leg/foot., claudication,Skin:  Psych:  Endo:    Hem:  Renal:  Edema  Neuro: ,      Objective:     BP 117/54   Pulse 91   Temp 98.5 ??F (36.9 ??C)   Resp 16   Ht 5\' 6"  (1.676 m)   Wt 85.322 kg (188 lb 1.6 oz)   BMI 30.36 kg/m2   SpO2 92%    Physical Exam:      GEN: Awake, alert, up in chair in NAD  NECK: Full Range of motion, no cervical adenopathy, no carotid bruit and no jugular vein distension, trachea midline.  LUNGS: Rhonchi bilaterally base  HEART: Regular rate and rhythm, no mumurs  ABDOMEN: Soft, non-tender,Bowel sounds normal, No masses, No organomegaly, No pulsatile masses or bruits.  RIGHT ARM: No clubbing, cyanosis or edema, no ischemic/embolic lesions  LEFT ARM: No clubbing, cyanosis or edema, no ischemic/embolic lesions  RIGHT LEG: No inguinal adenopathy, no clubbing, cyanosis or edema, no ischemic lesions,   LEFT LEG:  No inguinal adenopathy, no clubbing, cyanosis or edema, ischemic, atrophic looking toe ulcer in bed of partially removed first toe nail, no significant swelling or drainage, 2+ tenderness. ischemic lesions.    PULSES:   Extremity Radial Brachial   Right Upper Extremity weakly palpable normal   Left Upper Extremity normal normal    Femoral Popliteal Dorsalis Pedis Posterior Tibial   Right Lower Extremity weakly palpable doppler only absent doppler only   Left Lower Extremity weakly palpable doppler only absent doppler only, faint,monophasic   SKIN: Skin color, texture, turgor normal. No rashes or lesions   NEUROLOGIC: CNII-XII intact. Normal strength, sensation and reflexes throughout, mood affect within normal limits    Review of data:        LABS:   Recent Labs   Basename 05/18/11 0300 05/17/11 0222    WBC 7.2 6.1    HGB 9.8* 9.4*    HCT 29.4* 27.9*    PLT 133* 121*     Recent Labs   Basename 05/18/11 0300 05/17/11 1030 05/17/11 0222 05/16/11 2124    NA 131* -- 137 --    K 4.4 -- 4.0 --    CL 96* -- 101 --    CO2 25 -- 27 --    BUN 55* -- 45* --    CREA 3.91* -- 2.98* --    GLU 210* -- 84 --    PHOS 3.8 -- 1.8* --    CA 8.7 -- 8.4* --    CPK 335* 434* 435* --    TROIQ 5.30* 10.75* 15.78* --    CRP -- -- -- --    ALB -- -- 3.1* 3.4*    GLOB -- -- 4.3* 4.7*     Recent Labs   Basename 05/18/11 0300 05/17/11 2020 05/17/11 0222 05/16/11 2124    INR -- -- 1.2* --    PTP -- -- 13.0* --    PTTP 84.9* 72.1* -- --    TP -- -- 7.4 8.1     Lab Results   Component Value Date/Time    Specimen Description: NARES 09/29/2009 11:45 PM    Specimen Description: NARES 08/08/2009 10:50 PM    Specimen Description: NARES 08/06/2009  2:00 PM     Lab Results   Component Value Date/Time    Culture result:  Value: MRSA NOT PRESENT     Screening of patient nares for MRSA is for surveillance purposes and, if positive, to facilitate isolation considerations in high risk settings. It is not intended for automatic decolonization interventions per se as regimens are not sufficiently effective to warrant routine use. 09/29/2009 11:45 PM    Culture result:  Value: MRSA NOT PRESENT     Screening of patient nares for MRSA is for surveillance purposes and, if positive, to facilitate isolation considerations in high risk settings. It is not intended for automatic decolonization interventions per se as regimens are not sufficiently effective to warrant routine use. 08/08/2009 10:50 PM     Culture result:  Value: MRSA NOT PRESENT     Screening of patient nares for MRSA is for surveillance purposes and, if positive,  to facilitate isolation considerations in high risk settings. It is not intended for automatic decolonization interventions per se as regimens are not sufficiently effective to warrant routine use. 08/06/2009  2:00 PM         Davy Pique, MD, MD  05/18/2011      cc:  Sol Blazing, MD   Dayna Barker, MD

## 2011-05-18 NOTE — Progress Notes (Signed)
Bedside and Verbal shift change report given to Kristine, RN (oncoming nurse) by Ravy, RN (offgoing nurse).  Report given with SBAR, Kardex, Intake/Output, MAR and Recent Results.

## 2011-05-18 NOTE — Progress Notes (Signed)
RNA F/U Note    CC: AKI    Renal function has declined. BP low"ish"  Pt feels good.     ROS: othewise negative    Meds: reviewed from EHR    PE:  VS noted  NAD  Anicteric  CTAB  RRR  Soft + bs  No edema  Neuro: axox4  Skin warm and ry    Labs:  Reviewed    Cr 3.9    I/P:  1. AKI: Dramatic decline. No acute need RRT. Check labs at noon.    Shaneeka Scarboro,M.D.  RNA

## 2011-05-18 NOTE — Other (Signed)
Cardiopulmonary Rehab:  Follow-up visit with patient.  Reviewed signs and symptoms of angina to notify his nurse.  Reviewed heart healthy/low salt, diabetic diet.  Patient awaiting possible cardiac cath on Monday.  Patient voiced understanding of instructions.  Will follow.

## 2011-05-19 LAB — GLUCOSE, POC
Glucose (POC): 229 mg/dL — ABNORMAL HIGH (ref 75–110)
Glucose (POC): 80 mg/dL (ref 75–110)

## 2011-05-19 LAB — PTT
aPTT: 50.2 s — ABNORMAL HIGH (ref 24.0–31.5)
aPTT: 52.2 s — ABNORMAL HIGH (ref 24.0–31.5)
aPTT: 59 s — ABNORMAL HIGH (ref 24.0–31.5)
aPTT: 61 s — ABNORMAL HIGH (ref 24.0–31.5)

## 2011-05-19 LAB — RENAL FUNCTION PANEL
Albumin: 2.9 g/dL — ABNORMAL LOW (ref 3.5–5.0)
Anion gap: 6 mmol/L (ref 5–15)
BUN/Creatinine ratio: 20 (ref 12–20)
BUN: 41 MG/DL — ABNORMAL HIGH (ref 6–20)
CO2: 26 MMOL/L (ref 21–32)
Calcium: 8.6 MG/DL (ref 8.5–10.1)
Chloride: 102 MMOL/L (ref 97–108)
Creatinine: 2.03 MG/DL — ABNORMAL HIGH (ref 0.45–1.15)
GFR est AA: 39 mL/min/{1.73_m2} — ABNORMAL LOW (ref >60)
GFR est non-AA: 33 mL/min/{1.73_m2} — ABNORMAL LOW (ref >60)
Glucose: 225 MG/DL — ABNORMAL HIGH (ref 65–100)
Phosphorus: 3.1 MG/DL (ref 2.5–4.9)
Potassium: 4.6 MMOL/L (ref 3.5–5.1)
Sodium: 134 MMOL/L — ABNORMAL LOW (ref 136–145)

## 2011-05-19 LAB — MAGNESIUM: Magnesium: 2 MG/DL (ref 1.6–2.4)

## 2011-05-19 MED ORDER — INSULIN LISPRO 100 UNIT/ML INJECTION
100 unit/mL | Freq: Three times a day (TID) | SUBCUTANEOUS | Status: DC
Start: 2011-05-19 — End: 2011-05-21
  Administered 2011-05-19 – 2011-05-21 (×7): via SUBCUTANEOUS

## 2011-05-19 NOTE — Progress Notes (Addendum)
Pt stable. No c/o chest pain. Complains only of left foot pain. Medicated per order.    1100 - Dr. Carmell Austria in to see pt. Dressing changed per order after he left.    1200 - pt's lung sounds are more course than before. Reported this to Dr. Milbert Coulter. No orders received.     1700 - prep done for cath in am. Consents signed.

## 2011-05-19 NOTE — Progress Notes (Addendum)
1926 Bedside and Verbal shift change report given to Juline Patch RN (oncoming nurse) by Cyndi Bender RN (off going nurse).  Report given with SBAR, Intake/Output, MAR and Recent Results.   2001 Dr Phoebe Perch (Renal) in to see pt.  Ordered Mucomyst for tonight.  2151 Pt . HR 101 B/P 178/67. EKG obtained showed  wide complex ventricular rhythm in V1-V6  Pt asymptomatic.  2157 Hr 102 wide ventricular  complex in Leads 1-4  2200 BG was 310. Gave 8 units of Humalog that was held at [employer] when his BG was 80. Gave Snack.  2205 Hr 111 B/p 189/77 Sinus tach  2214-2223  Pt has wide ventricular rhythm but remains asymptomatic.  2243 B/P 206/72,  Sinus Tach . Pt is asymptomatic  2305 Dr Milbert Coulter Paged new orders given. Metoprolol 50 mg po now and Q6 Hrs.            Clonidine 0.1 mg now and Q6  hrs for systolic B/P  > 180.  0019 Gave 2 mg Morphine IV for Pain in Left foot. Pt instructed not to eat or drink anything after midnight. Water pitcher removed from bedside.  0116 Pt sleeping HR 78 B/P 145/74 SR BBB  0453 [facility] [employer] Called. Pt confused,  combative and incontinent of urine. Attempted Neuro check but pt uncooperative. Heparin and fluids discontinued.  1610  Supervisor Olegario Messier  spoke with him but he was still confused and noncompliant.Marland Kitchen  0505 BG checked and was 278 mg/dl. Refused to allow blood draw for labs. Again attempted neuro check but Pt noncompliant. Responded to questions with "You ought to already know that". Stated he didn't know who he was or where he was.   Dr Selena Batten paged but he was in a rapid response. Bed linens and gown changed. Pt compliant.  9604 PT's  Daughter Armanii Pressnell)  was notified of change in her fathers condition. He spoke with her on the phone.   she stated "He wasn't making any sense"  0530 Dr Milbert Coulter notified of change in Pt's condition .CT scan ordered.   5409 Dr Selena Batten  In and assessed pt.   Neuro check  Performed. Pt told Dr Selena Batten "I know you".  When Pt was  asked.if he knew HIS own  name he replied no.   0550 Down to CT.  0605 Back from CT, pt tolerated well.  Still refused blood draw. Pt responded yes when asked if he knew his name and   where he was but would not elaborate.  0615 B/P 154/57 Hr 74. Pt resting in bed with eyes closed.     Marland Kitchen

## 2011-05-19 NOTE — Progress Notes (Signed)
Bedside and Verbal shift change report given to RAndrews, RN (oncoming nurse) by KMiller, RN (offgoing nurse).  Report given with SBAR, Kardex and MAR.

## 2011-05-19 NOTE — Progress Notes (Signed)
[facility]  [address] [place], [place] [address]  [phone]      Medical Progress Note      NAME: Travis [name]   DOB:  March 27, 1941  MRM:  [id number]    Date/Time: 05/19/2011         Subjective:     Chief Complaint:  Patient was seen and examined by me.  Chart reviewed.  Only c/o leg pain, no chest pain       Objective:       Vitals:       Last 24hrs VS reviewed since prior progress note. Most recent are:    Visit Vitals   Item Reading   ??? BP 155/58   ??? Pulse 89   ??? Temp 98.3 ??F (36.8 ??C)   ??? Resp 19   ??? Ht 5\' 6"  (1.676 m)   ??? Wt 187 lb   ??? BMI 30.18 kg/m2   ??? SpO2 96%     SpO2 Readings from Last 6 Encounters:   05/19/11 96%   11/13/09 98%   11/13/09 98%   11/13/09 98%   10/17/09 100%   10/15/09 98%    O2 Flow Rate (L/min): 2 l/min       Intake/Output Summary (Last 24 hours) at 05/19/11 0743  Last data filed at 05/19/11 2130   Gross per 24 hour   Intake 3831.67 ml   Output   2325 ml   Net 1506.67 ml        Exam:     Physical Exam:    Gen: NAD  HEENT: Pink conjunctivae, PERRL, hearing intact, moist mucous membranes   Neck: Supple, without masses, thyroid non-tender   Resp: No accessory muscle use, CTAB   Card: No murmurs, normal S1, S2 without thrills, trace, poor pulses  Abd: Soft, non-tender, non-distended, normoactive bowel sounds are present, no palpable organomegaly and no detectable hernias   Lymph: No cervical adenopathy   Musc: No cyanosis or clubbing   Skin: left toe wound (POA)  Neuro: Cranial nerves 3-12 are grossly intact, follows commands appropriately   Psych: Alert with good insight. Oriented to person, place, and time    Medications Reviewed: (see below)    Lab Data Reviewed: (see below)    ______________________________________________________________________    Medications:     Current Facility-Administered Medications   Medication Dose Route Frequency   ??? insulin lispro (HUMALOG) injection 8 Units  8 Units SubCUTAneous TIDAC    ??? nitroglycerin (NITROBID) 2 % ointment 1 Inch  1 Inch Topical BID   ??? silver sulfADIAZINE (SILVADENE) 1 % topical cream   Topical BID   ??? insulin glargine (LANTUS) injection 20 Units  20 Units SubCUTAneous ACB   ??? sodium hypochlorite (1/4 STRENGTH DAKINS) irrigation (bottle)   Topical DAILY   ??? pantothenic ac-min oil-pet,hyd (AQUAPHOR) 41 % ointment   Topical TID   ??? aspirin chewable tablet 81 mg  81 mg Oral DAILY   ??? levothyroxine (SYNTHROID) tablet 150 mcg  150 mcg Oral ACB   ??? metoprolol (LOPRESSOR) tablet 50 mg  50 mg Oral BID   ??? misoprostol (CYTOTEC) tablet 200 mcg  200 mcg Oral BID WITH MEALS   ??? simvastatin (ZOCOR) tablet 20 mg  20 mg Oral QHS   ??? terazosin (HYTRIN) capsule 10 mg  10 mg Oral QHS   ??? 0.9% sodium chloride infusion  50 mL/hr IntraVENous CONTINUOUS   ??? SALINE PERIPHERAL FLUSH Q8H Soln 5 mL  5 mL InterCATHeter Q8H   ??? saline peripheral flush Soln 5 mL  5 mL InterCATHeter PRN   ??? acetaminophen (TYLENOL) tablet 650 mg  650 mg Oral Q6H PRN   ??? acetaminophen (TYLENOL) suppository 650 mg  650 mg Rectal Q4H PRN   ??? oxyCODONE-acetaminophen (PERCOCET) 5-325 mg per tablet 1 Tab  1 Tab Oral Q6H PRN   ??? morphine injection 2 mg  2 mg IntraVENous Q4H PRN   ??? naloxone (NARCAN) injection 0.4 mg  0.4 mg IntraVENous PRN   ??? acetaminophen (TYLENOL) tablet 650 mg  650 mg Oral Q6H PRN   ??? diphenhydrAMINE (BENADRYL) capsule 25 mg  25 mg Oral Q4H PRN   ??? ondansetron (ZOFRAN) injection 4 mg  4 mg IntraVENous Q4H PRN   ??? magnesium hydroxide (MILK OF MAGNESIA) oral suspension 30 mL  30 mL Oral DAILY PRN   ??? docusate sodium (COLACE) capsule 100 mg  100 mg Oral BID   ??? bisacodyl (DULCOLAX) suppository 10 mg  10 mg Rectal DAILY PRN   ??? glucose chewable tablet 16 g  4 Tab Oral PRN   ??? dextrose (D50W) injection Syrg 12.5-25 g  12.5-25 g IntraVENous PRN   ??? glucagon (GLUCAGEN) injection 1 mg  1 mg IntraMUSCular PRN   ??? pantoprazole (PROTONIX) tablet 40 mg  40 mg Oral ACB    ??? promethazine (PHENERGAN) 12.5 mg in NS 50 ml IVPB  12.5 mg IntraVENous Q8H PRN   ??? famotidine (PEPCID) tablet 20 mg  20 mg Oral ACB&D   ??? heparin 25,000 units in D5W 250 ml infusion  12-25 Units/kg/hr IntraVENous TITRATE          Lab Review:     Recent Labs   Basename 05/18/11 0300 05/17/11 0222 05/16/11 1630    WBC 7.2 6.1 6.5    HGB 9.8* 9.4* 11.1*    HCT 29.4* 27.9* 33.1*    PLT 133* 121* 127*     Recent Labs   Basename 05/19/11 0330 05/18/11 1300 05/18/11 0300 05/17/11 0222 05/16/11 2124 05/16/11 1630    NA 134* 133* 131* -- -- --    K 4.6 4.2 4.4 -- -- --    CL 102 97 96* -- -- --    CO2 26 28 25  -- -- --    GLU 225* 236* 210* -- -- --    BUN 41* 49* 55* -- -- --    CREA 2.03* 3.14* 3.91* -- -- --    CA 8.6 8.6 8.7 -- -- --    MG 2.0 -- 2.1 2.1 -- --    PHOS 3.1 3.4 3.8 -- -- --    ALB 2.9* 3.0* -- 3.1* -- --    TBIL -- -- -- 0.4 0.4 0.6    SGOT -- -- -- 54* 47* 31    INR -- -- -- 1.2* -- --     Lab Results   Component Value Date/Time    POC GLUCOSE 229 05/18/2011 10:01 PM    POC GLUCOSE 181 05/18/2011  4:20 PM    POC GLUCOSE 261 05/18/2011 11:40 AM    POC GLUCOSE 212 05/18/2011  7:46 AM    POC GLUCOSE 254 05/17/2011  9:08 PM          Assessment / Plan:     70 yo hx of CAD s/p stents, DM, CKD 3, diastolic CHF, presented w/ NSTEMI, DKA     1) NSTEMI (non-ST elevated myocardial infarction)/Chest pain, unspecified: Trop peaked at 15.  Cont IVCU care. Cont O2, morphine, ASA, nitro, BB, heparin gtt.  Follows cardiac enzymes. Cards following, plan for cath Monday    2) DKA, type 2, not at goal: due to medical non-compliance. Had been followed by Dr. Angelina Ok, endocrine, but has not seen her in a while.  DKA improved with insulin gtt.  Cont Lantus at 20u, premeal aspart, increase prn.  A1C 10.5% .  Endocrine and DTC already evaluated the patient. He will need to f/u with Dr. Angelina Ok on discharge     3) Acute renal failure (ARF)/CKD 3: likely due dehydration and progression of CKD. Cont gently IVF. Will hold bumex and lisinopril.  Renal following.  Might need mucomyst if cath is planned.      4) Acute on chronic diastolic congestive heart failure: Last echo in 2011 with EF of 60%. BNP 2,000 on admission w/ pulm edema on CXR. Given poor renal function, will give IV diuretics prn.  Holding home bumex, lisinopril     5) Unspecified hypothyroidism: cont synthroid     6) Left foot wound: wound care following.  Xray neg for osteo    7) PVD/leg pain: ABI of 0.34 on right and 0.38 on left.  Vascular following. Cont pain control    Full Code    Total time spent with patient: 30 Minutes                  Care Plan discussed with: Patient    Discussed:  Care Plan    Prophylaxis:  Hep SQ    Disposition:  Home w/Family           ___________________________________________________    Attending Physician: Twanna Hy. Daeron Carreno, MD

## 2011-05-19 NOTE — Progress Notes (Signed)
Spiritual Care Assessment/Progress Notes    TRIPTON NED [id number]  [id number]    1940/06/23  70 y.o.  male    Patient Telephone Number: [phone] (home)   Religious Affiliation: Non denominational   Language: English   Extended Emergency Contact Information  Primary Emergency Contact: [place]  Home Phone: [phone]  Mobile Phone: [phone]  Relation: Child   Patient Active Problem List   Diagnoses Date Noted   ??? PAD (peripheral artery disease) 05/18/2011   ??? Ischemic toe ulcer 05/18/2011   ??? NSTEMI (non-ST elevated myocardial infarction) 05/16/2011   ??? DKA, type 2, not at goal 05/16/2011   ??? Acute renal failure (ARF) 05/16/2011   ??? Unspecified hypothyroidism 05/16/2011   ??? HTN (hypertension) 10/13/2009   ??? Unspecified hypothyroidism 10/13/2009   ??? PVD (peripheral vascular disease) 10/06/2009   ??? Hyperlipidemia 10/06/2009   ??? Chronic kidney disease, stage III (moderate) 09/29/2009   ??? Anemia, unspecified 09/29/2009   ??? Back pain 09/29/2009   ??? CAD (coronary artery disease)         Date: 05/19/2011       Level of Religious/Spiritual Activity:  []          Involved in faith tradition/spiritual practice    []          Not involved in faith tradition/spiritual practice  []          Spiritually oriented    []          Claims no spiritual orientation    []          seeking spiritual identity  []          Feels alienated from religious practice/tradition  []          Feels angry about religious practice/tradition  [x]          Spirituality/religious tradition Is a resource for coping at this time.  []          Not able to assess due to medical condition    Services Provided Today:  []          crisis intervention    []          reading Scriptures  [x]          spiritual assessment    []          prayer  [x]          empathic listening/emotional support  []          rites and rituals (cite in comments)  []          life review     []          religious support   []          theological development   []          advocacy  []          ethical dialog     []          blessing  []          bereavement support    []          support to family  []          anticipatory grief support   []          help with AMD  []          spiritual guidance    []          meditation      Spiritual Care Needs  [x]   Emotional Support  []          Spiritual/Religious Care  []          Loss/Adjustment  []          Advocacy/Referral /Ethics  []          No needs expressed at this time  []          Other: (note in comments)  Spiritual Care Plan  []          Follow up visits with pt/family  []          Provide materials  []          Schedule sacraments  []          Contact [facility]  [x]          Follow up as needed  []          Other: (note in comments)     Comments: Completed initial visit and Spiritual Assessment. Patient has family support. Provided empathic listening and presence. Will continue to follow as able and or as needed.    Clare Gandy, Chaplain

## 2011-05-19 NOTE — Progress Notes (Signed)
RNA F/U Note     CC: AKI     Renal function improved. Good UO but + 2L.  Pt is without complaint. Plan is to go for cath in am.     ROS: otherwise negative     Meds: reviewed from EHR    PE:   VS noted no further hypotension  GEN: NAD   HEENT: Anicteric   Chest: CTAB   Heart: RRR   Abdomen: Soft + bs   Extrem: No edema   Neuro: axox4   Skin warm and dry     Labs:   Reviewed   Cr 2.0    I/P:   1. AKI: Improvement: Plan for cath. IVF + Mucomyst.    Travis [name],M.D.   RNA

## 2011-05-19 NOTE — Progress Notes (Signed)
Vascular Surgery Progress Note    Admit Date: 05/16/2011      Subjective:     Up in chair no SOB/CP  C/o moderate left toe pain    Objective:     Vitals:  BP 155/58   Pulse 89   Temp 98.3 ??F (36.8 ??C)   Resp 19   Ht 5\' 6"  (1.676 m)   Wt 84.823 kg (187 lb)   BMI 30.18 kg/m2   SpO2 96%  Temp (24hrs), Avg:98.9 ??F (37.2 ??C), Min:98.1 ??F (36.7 ??C), Max:99.7 ??F (37.6 ??C)    12/21 1900 - 12/23 0659  In: 4381.4 [P.O.:1060; I.V.:3321.4]  Out: 2650 [Urine:2650]    Intake/Output Summary (Last 24 hours) at 05/19/11 1049  Last data filed at 05/19/11 0957   Gross per 24 hour   Intake 3978.14 ml   Output   2525 ml   Net 1453.14 ml        Physical Exam:    GEN: Awake, alert, NAD  NECK: supple  LUNGS: Clear to auscultation bilaterally  HEART: Regular rate and rhythm, no murmurs  NEUROLOGIC: CNII-XII intact. Upper and lower extremity motor/sensory intact. Speech normal.  ABDOMEN: nontender  EXTREMITIES:warm to touch, ischemic left first toe ulcer stable, first toe is very tender, minimal swelling      Labs:   Recent Labs   Basename 05/18/11 0300 05/17/11 0222    WBC 7.2 6.1    HGB 9.8* 9.4*    HCT 29.4* 27.9*    PLT 133* 121*     Recent Labs   Basename 05/19/11 0330 05/18/11 1300 05/18/11 0300 05/17/11 1030 05/17/11 0222 05/16/11 2124    NA 134* 133* -- -- -- --    K 4.6 4.2 -- -- -- --    CL 102 97 -- -- -- --    CO2 26 28 -- -- -- --    BUN 41* 49* -- -- -- --    CREA 2.03* 3.14* -- -- -- --    GLU 225* 236* -- -- -- --    PHOS 3.1 3.4 -- -- -- --    CA 8.6 8.6 -- -- -- --    CPK -- -- 335* 434* 435* --    TROIQ -- -- 5.30* 10.75* 15.78* --    CRP -- -- -- -- -- --    ALB 2.9* 3.0* -- -- -- --    GLOB -- -- -- -- 4.3* 4.7*     Recent Labs   Basename 05/19/11 0330 05/18/11 2015 05/17/11 0222 05/16/11 2124    INR -- -- 1.2* --    PTP -- -- 13.0* --    PTTP 52.2* 50.2* -- --    TP -- -- 7.4 8.1     Lab Results   Component Value Date/Time    Specimen Description: NARES 09/29/2009 11:45 PM     Specimen Description: NARES 08/08/2009 10:50 PM    Specimen Description: NARES 08/06/2009  2:00 PM     Lab Results   Component Value Date/Time    Culture result:  Value: MRSA NOT PRESENT     Screening of patient nares for MRSA is for surveillance purposes and, if positive, to facilitate isolation considerations in high risk settings. It is not intended for automatic decolonization interventions per se as regimens are not sufficiently effective to warrant routine use. 09/29/2009 11:45 PM    Culture result:  Value: MRSA NOT PRESENT     Screening of patient nares for MRSA is  for surveillance purposes and, if positive, to facilitate isolation considerations in high risk settings. It is not intended for automatic decolonization interventions per se as regimens are not sufficiently effective to warrant routine use. 08/08/2009 10:50 PM    Culture result:  Value: MRSA NOT PRESENT     Screening of patient nares for MRSA is for surveillance purposes and, if positive, to facilitate isolation considerations in high risk settings. It is not intended for automatic decolonization interventions per se as regimens are not sufficiently effective to warrant routine use. 08/06/2009  2:00 PM       Assessment:   Principal Problem:   *NSTEMI (non-ST elevated myocardial infarction) (05/16/2011)  Active Problems:     CAD (coronary artery disease) ()   Cath 1999 distal disease in the apical LAD and right coronary artery with    a normal ejection fraction.   Non q MI in 7/06.cath - mod LAD abn RCA disease up to 50-60% , small    occluded LCX with normal LVEF.     (?NSTEMI) during admission with pneumonia and ARF 05/2009 - proceeded to    have a PCI   (DES) to RCA.     NSTEMI (trop ~30) on 06/21/09 but cath showed stable findings.     NSTEMI with another cath in 2/11 - the stent was patent and no new    findings.     Multiple NSTEMI's in 5/11 - proceeded on 10/16/09 to PCI of 85% ulcerated     plaque mid RCA successfully stented with 2.75 Xience.  CTO Cx successful    PTCA and DES of proximal part with 2.25 Atom.            Chronic kidney disease, stage III (moderate) (09/29/2009)     HTN (hypertension) (10/13/2009)     DKA, type 2, not at goal (05/16/2011)     Acute renal failure (ARF) (05/16/2011)     Unspecified hypothyroidism (05/16/2011)     PAD (peripheral artery disease) (05/18/2011)   PVR tracing 05/18/2011 suggest bilateral SFA occlusion and calf vessel    disease    and high grade stenosis of his proximal fem-pop   Will do angio after  PCI performed and renal function optimized.     Ischemic toe ulcer (05/18/2011)   Left first toe  Silvadene cream              Plan/Recommendations/Medical Decision Making:   Will do angio after  PCI performed and renal function optimized.

## 2011-05-19 NOTE — Progress Notes (Signed)
Travis Levett J. Milbert Coulter, MD [facility]  [address]  [place], [place] [address]    Office [phone]  Fax [phone]  Cell [phone]                    05/19/2011     Admit Date: 05/16/2011    Admit Diagnosis: NSTEMI (non-ST elevated myocardial infarction)    Principal Problem:   *NSTEMI (non-ST elevated myocardial infarction) (05/16/2011)  Active Problems:     CAD (coronary artery disease) ()   Cath 1999 distal disease in the apical LAD and right coronary artery with    a normal ejection fraction.   Non q MI in 7/06.cath - mod LAD abn RCA disease up to 50-60% , small    occluded LCX with normal LVEF.     (?NSTEMI) during admission with pneumonia and ARF 05/2009 - proceeded to    have a PCI   (DES) to RCA.     NSTEMI (trop ~30) on 06/21/09 but cath showed stable findings.     NSTEMI with another cath in 2/11 - the stent was patent and no new    findings.     Multiple NSTEMI's in 5/11 - proceeded on 10/16/09 to PCI of 85% ulcerated    plaque mid RCA successfully stented with 2.75 Xience.  CTO Cx successful    PTCA and DES of proximal part with 2.25 Atom.            Chronic kidney disease, stage III (moderate) (09/29/2009)     HTN (hypertension) (10/13/2009)     DKA, type 2, not at goal (05/16/2011)     Acute renal failure (ARF) (05/16/2011)     Unspecified hypothyroidism (05/16/2011)     PAD (peripheral artery disease) (05/18/2011)   PVR tracing 05/18/2011 suggest bilateral SFA occlusion and calf vessel    disease    and high grade stenosis of his proximal fem-pop        Ischemic toe ulcer (05/18/2011)   Left first toe      Assessment/Plan:   No recurrent ischemic sx or evidence of decompensated HF. Renal function has improved. Has significant PAD with rest pain.  Reviewed strategy with Dr Carmell Austria - he feels it is prudent to defer peripheral angio/possible endovascular intervention for now. Will proceed with cath tomorrow but Dr Carmell Austria has recommended ultrasound guidance to ensure puncture in CFA above graft insertion. He would also like magnified shot of prox SFA if possible at time of cath.  High risk for ARF. Continue ACS Rx     Cath, no LVG tomorrow with Dr Elmon Kirschner  ASA 3  Airway 3    Subjective:  Continue with rest pain left foot/leg. No recurrent chest pain. No dyspnea at rest. Patient denies any exertional chest pain, dyspnea, palpitations, syncope, orthopnea, edema or paroxysmal nocturnal dyspnea. + rest pain left leg            Travis Leavens, MD    Objective:   BP 155/58   Pulse 89   Temp 98.3 ??F (36.8 ??C)   Resp 19   Ht 5\' 6"  (1.676 m)   Wt 187 lb (84.823 kg)   BMI 30.18 kg/m2   SpO2 96%  Telemetry: normal sinus rhythm    Physical Exam:  Neck: supple, symmetrical, trachea midline, no adenopathy, thyroid: not enlarged, symmetric, no tenderness/mass/nodules and no JVD  Heart: regular rate and rhythm, S1, S2 normal, no murmur, click, rub or gallop  Lungs: clear to auscultation bilaterally  Abdomen: soft, non-tender. Bowel sounds normal. No masses,  no organomegaly  Extremities: no edema  Neurologic: Grossly normal    EKG sinus, inferior Qs, STE inferior leads resolved  Labs:    Recent Labs   Basename 05/18/11 0300    CPK 335*    CPKMB 27.6*    TROIQ 5.30*     Recent Labs   [facility] 05/19/11 0330    NA 134*    K 4.6    CL 102    BUN 41*    CREA 2.03*    GLU 225*    PHOS 3.1    CA 8.6     Recent Labs   Basename 05/18/11 0300    WBC 7.2    HGB 9.8*    HCT 29.4*    PLT 133*     Recent Labs   Basename 05/17/11 0222    TGL 48    CHOL 123    HDLC 55    LDLC 58.4

## 2011-05-20 LAB — EKG, 12 LEAD, INITIAL
Atrial Rate: 100 {beats}/min
Atrial Rate: 107 {beats}/min
Atrial Rate: 91 {beats}/min
Calculated P Axis: 49 degrees
Calculated P Axis: 52 degrees
Calculated P Axis: 60 degrees
Calculated R Axis: 62 degrees
Calculated R Axis: 80 degrees
Calculated R Axis: 88 degrees
Calculated T Axis: 107 degrees
Calculated T Axis: 78 degrees
Calculated T Axis: 93 degrees
Diagnosis: NORMAL
Diagnosis: NORMAL
P-R Interval: 132 ms
P-R Interval: 190 ms
P-R Interval: 204 ms
Q-T Interval: 368 ms
Q-T Interval: 392 ms
Q-T Interval: 402 ms
QRS Duration: 124 ms
QRS Duration: 128 ms
QRS Duration: 158 ms
QTC Calculation (Bezet): 474 ms
QTC Calculation (Bezet): 494 ms
QTC Calculation (Bezet): 523 ms
Ventricular Rate: 100 {beats}/min
Ventricular Rate: 107 {beats}/min
Ventricular Rate: 91 {beats}/min

## 2011-05-20 LAB — RENAL FUNCTION PANEL
Albumin: 2.7 g/dL — ABNORMAL LOW (ref 3.5–5.0)
Anion gap: 6 mmol/L (ref 5–15)
BUN/Creatinine ratio: 19 (ref 12–20)
BUN: 25 MG/DL — ABNORMAL HIGH (ref 6–20)
CO2: 26 MMOL/L (ref 21–32)
Calcium: 8.3 MG/DL — ABNORMAL LOW (ref 8.5–10.1)
Chloride: 103 MMOL/L (ref 97–108)
Creatinine: 1.34 MG/DL — ABNORMAL HIGH (ref 0.45–1.15)
GFR est AA: >60 mL/min/{1.73_m2} (ref >60)
GFR est non-AA: 53 mL/min/{1.73_m2} — ABNORMAL LOW (ref >60)
Glucose: 309 MG/DL — ABNORMAL HIGH (ref 65–100)
Phosphorus: 2.9 MG/DL (ref 2.5–4.9)
Potassium: 4.7 MMOL/L (ref 3.5–5.1)
Sodium: 135 MMOL/L — ABNORMAL LOW (ref 136–145)

## 2011-05-20 LAB — MAGNESIUM: Magnesium: 1.8 MG/DL (ref 1.6–2.4)

## 2011-05-20 LAB — GLUCOSE, POC
Glucose (POC): 310 mg/dL — ABNORMAL HIGH (ref 75–110)
Glucose (POC): 278 mg/dL — ABNORMAL HIGH (ref 75–110)
Glucose (POC): 329 mg/dL — ABNORMAL HIGH (ref 75–110)
Glucose (POC): 364 mg/dL — ABNORMAL HIGH (ref 75–110)
Glucose (POC): 312 mg/dL — ABNORMAL HIGH (ref 75–110)

## 2011-05-20 LAB — PROTHROMBIN TIME + INR
INR: 1.1 (ref 0.9–1.1)
Prothrombin time: 11.7 s (ref 9.4–11.7)

## 2011-05-20 LAB — CBC W/O DIFF
HCT: 27.9 % — ABNORMAL LOW (ref 36.6–50.3)
HGB: 9.5 g/dL — ABNORMAL LOW (ref 12.1–17.0)
MCH: 29.8 PG (ref 26.0–34.0)
MCHC: 34.1 g/dL (ref 30.0–36.5)
MCV: 87.5 FL (ref 80.0–99.0)
PLATELET: 124 10*3/uL — ABNORMAL LOW (ref 150–400)
RBC: 3.19 M/uL — ABNORMAL LOW (ref 4.10–5.70)
RDW: 15 % — ABNORMAL HIGH (ref 11.5–14.5)
WBC: 5 10*3/uL (ref 4.1–11.1)

## 2011-05-20 LAB — PTT: aPTT: 42.6 s — ABNORMAL HIGH (ref 24.0–31.5)

## 2011-05-20 MED ORDER — HEPARIN (PORCINE) IN NS (PF) 1,000 UNIT/500 ML IV
1000 unit/500 mL | INTRAVENOUS | Status: AC
Start: 2011-05-20 — End: 2011-05-20
  Administered 2011-05-20: 14:00:00 via INTRA_ARTERIAL

## 2011-05-20 MED ORDER — IODIXANOL 320 MG/ML IV SOLN
320 mg iodine/mL | INTRAVENOUS | Status: AC
Start: 2011-05-20 — End: 2011-05-20
  Administered 2011-05-20: 15:00:00 via INTRAVENOUS

## 2011-05-20 MED ORDER — CLONIDINE 0.1 MG TAB
0.1 mg | Freq: Four times a day (QID) | ORAL | Status: DC | PRN
Start: 2011-05-20 — End: 2011-05-25
  Administered 2011-05-20 – 2011-05-24 (×3): via ORAL

## 2011-05-20 MED ORDER — FENTANYL CITRATE (PF) 50 MCG/ML IJ SOLN
50 mcg/mL | INTRAMUSCULAR | Status: AC
Start: 2011-05-20 — End: 2011-05-20
  Administered 2011-05-20: 15:00:00 via INTRAVENOUS

## 2011-05-20 MED ORDER — LIDOCAINE HCL 1 % (10 MG/ML) IJ SOLN
10 mg/mL (1 %) | INTRAMUSCULAR | Status: DC | PRN
Start: 2011-05-20 — End: 2011-05-20

## 2011-05-20 MED ORDER — MORPHINE 10 MG/ML INJ SOLUTION
10 mg/ml | INTRAMUSCULAR | Status: DC | PRN
Start: 2011-05-20 — End: 2011-05-22

## 2011-05-20 MED ORDER — METOPROLOL TARTRATE 50 MG TAB
50 mg | ORAL | Status: AC
Start: 2011-05-20 — End: 2011-05-19
  Administered 2011-05-20: 05:00:00 via ORAL

## 2011-05-20 MED ORDER — LIDOCAINE HCL 1 % (10 MG/ML) IJ SOLN
10 mg/mL (1 %) | INTRAMUSCULAR | Status: AC
Start: 2011-05-20 — End: 2011-05-20
  Administered 2011-05-20: 15:00:00 via SUBCUTANEOUS

## 2011-05-20 MED ORDER — ACETAMINOPHEN 325 MG TABLET
325 mg | ORAL | Status: DC | PRN
Start: 2011-05-20 — End: 2011-05-22

## 2011-05-20 MED ORDER — METOPROLOL TARTRATE 50 MG TAB
50 mg | Freq: Once | ORAL | Status: DC
Start: 2011-05-20 — End: 2011-05-19

## 2011-05-20 MED ORDER — ONDANSETRON (PF) 4 MG/2 ML INJECTION
4 mg/2 mL | Freq: Once | INTRAMUSCULAR | Status: DC | PRN
Start: 2011-05-20 — End: 2011-05-22

## 2011-05-20 MED ORDER — ACETYLCYSTEINE 20 % (200 MG/ML) SOLN
200 mg/mL (20 %) | Freq: Two times a day (BID) | Status: DC
Start: 2011-05-20 — End: 2011-05-22
  Administered 2011-05-20 – 2011-05-21 (×6): via ORAL

## 2011-05-20 MED ORDER — FAMOTIDINE (PF) 20 MG/2 ML IV
20 mg/2 mL | Freq: Once | INTRAVENOUS | Status: DC | PRN
Start: 2011-05-20 — End: 2011-05-22

## 2011-05-20 MED ORDER — DIPHENHYDRAMINE HCL 50 MG/ML IJ SOLN
50 mg/mL | INTRAMUSCULAR | Status: DC | PRN
Start: 2011-05-20 — End: 2011-05-22

## 2011-05-20 MED ORDER — HYDROCORTISONE SOD SUCCINATE (PF) 100 MG/2 ML SOLUTION FOR INJECTION
100 mg/2 mL | Freq: Once | INTRAMUSCULAR | Status: DC | PRN
Start: 2011-05-20 — End: 2011-05-22

## 2011-05-20 MED ORDER — SODIUM CHLORIDE 0.9 % IV
INTRAVENOUS | Status: AC
Start: 2011-05-20 — End: 2011-05-20
  Administered 2011-05-20: 16:00:00 via INTRAVENOUS

## 2011-05-20 MED ORDER — MIDAZOLAM 1 MG/ML IJ SOLN
1 mg/mL | INTRAMUSCULAR | Status: AC
Start: 2011-05-20 — End: 2011-05-20
  Administered 2011-05-20: 15:00:00 via INTRAVENOUS

## 2011-05-20 MED ORDER — HEPARIN (PORCINE) IN NS (PF) 1,000 UNIT/500 ML IV
1000 unit/500 mL | Freq: Once | INTRAVENOUS | Status: AC
Start: 2011-05-20 — End: 2011-05-20
  Administered 2011-05-20: 14:00:00

## 2011-05-20 MED ORDER — MIDAZOLAM 1 MG/ML IJ SOLN
1 mg/mL | INTRAMUSCULAR | Status: DC | PRN
Start: 2011-05-20 — End: 2011-05-20
  Administered 2011-05-20: 15:00:00 via INTRAVENOUS

## 2011-05-20 MED ORDER — PROMETHAZINE 25 MG/ML INJECTION
25 mg/mL | INTRAMUSCULAR | Status: DC | PRN
Start: 2011-05-20 — End: 2011-05-22

## 2011-05-20 MED ORDER — PROMETHAZINE 25 MG/ML INJECTION
25 mg/mL | INTRAMUSCULAR | Status: DC | PRN
Start: 2011-05-20 — End: 2011-05-20

## 2011-05-20 MED ORDER — HEPARIN (PORCINE) IN NS (PF) 1,000 UNIT/500 ML IV
1000 unit/500 mL | Freq: Once | INTRAVENOUS | Status: AC
Start: 2011-05-20 — End: 2011-05-20

## 2011-05-20 MED ORDER — FENTANYL CITRATE (PF) 50 MCG/ML IJ SOLN
50 mcg/mL | INTRAMUSCULAR | Status: DC | PRN
Start: 2011-05-20 — End: 2011-05-20

## 2011-05-20 MED ORDER — METOPROLOL TARTRATE 50 MG TAB
50 mg | Freq: Four times a day (QID) | ORAL | Status: DC
Start: 2011-05-20 — End: 2011-05-22
  Administered 2011-05-21 – 2011-05-22 (×5): via ORAL

## 2011-05-20 MED ORDER — IODIXANOL 320 MG/ML IV SOLN
320 mg iodine/mL | Freq: Once | INTRAVENOUS | Status: AC
Start: 2011-05-20 — End: 2011-05-20

## 2011-05-20 NOTE — Procedures (Signed)
Cath:  Obstructive 3VD.  Rec med mgmt with long-term ASA/plavix.

## 2011-05-20 NOTE — Progress Notes (Addendum)
[place] [facility]  [address] [place], [place] [address]  [phone]      Medical Progress Note      NAME: Travis [name]   DOB:  22-Mar-1971  MRM:  [id number]    Date/Time: 05/20/2011         Subjective:     Chief Complaint:  Patient was seen and examined by me.  Chart reviewed.  Patient became confused overnight.  Stable again       Objective:       Vitals:       Last 24hrs VS reviewed since prior progress note. Most recent are:    Visit Vitals   Item Reading   ??? BP 154/67   ??? Pulse 75   ??? Temp 98 ??F (36.7 ??C)   ??? Resp 18   ??? Ht 5\' 6"  (1.676 m)   ??? Wt 187 lb   ??? BMI 30.18 kg/m2   ??? SpO2 98%     SpO2 Readings from Last 6 Encounters:   05/20/11 98%   11/13/09 98%   11/13/09 98%   11/13/09 98%   10/17/09 100%   10/15/09 98%    O2 Flow Rate (L/min): 2 l/min       Intake/Output Summary (Last 24 hours) at 05/20/11 0835  Last data filed at 05/20/11 0541   Gross per 24 hour   Intake 1790.63 ml   Output   2525 ml   Net -734.37 ml        Exam:     Physical Exam:    Gen: NAD  HEENT: Pink conjunctivae, PERRL, hearing intact, moist mucous membranes   Neck: Supple, without masses, thyroid non-tender   Resp: No accessory muscle use, CTAB   Card: No murmurs, normal S1, S2 without thrills, trace, poor pulses  Abd: Soft, non-tender, non-distended, normoactive bowel sounds are present, no palpable organomegaly and no detectable hernias   Lymph: No cervical adenopathy   Musc: No cyanosis or clubbing   Skin: left toe wound (POA)  Neuro: Cranial nerves 3-12 are grossly intact, follows commands appropriately   Psych: Alert with good insight. Oriented to person, place, and time    Medications Reviewed: (see below)    Lab Data Reviewed: (see below)    ______________________________________________________________________    Medications:     Current Facility-Administered Medications   Medication Dose Route Frequency   ??? fentaNYL citrate (PF) injection 25-200 mcg  25-200 mcg IntraVENous Multiple    ??? heparinized saline 2 units/mL infusion 1,000 Units  500 mL Irrigation ONCE   ??? heparinized saline 2 units/mL infusion 1,000 Units  500 mL IntraarTERial ONCE   ??? midazolam (VERSED) injection 0.5-10 mg  0.5-10 mg IntraVENous Multiple   ??? lidocaine (XYLOCAINE) 10 mg/mL (1 %) injection 10-30 mL  10-30 mL SubCUTAneous Multiple   ??? iodixanol (VISIPAQUE) 320 mg/mL contrast injection 200 mL  200 mL IntraVENous ONCE   ??? insulin lispro (HUMALOG) injection 8 Units  8 Units SubCUTAneous TIDAC   ??? acetylcysteine (MUCOMYST) 200 mg/mL (20 %) solution 1,200 mg  1,200 mg Oral BID   ??? metoprolol (LOPRESSOR) tablet 50 mg  50 mg Oral Q6H   ??? cloNIDine (CATAPRES) tablet 0.1 mg  0.1 mg Oral Q6H PRN   ??? metoprolol (LOPRESSOR) tablet 50 mg  50 mg Oral NOW   ??? nitroglycerin (NITROBID) 2 % ointment 1 Inch  1 Inch Topical BID   ??? silver sulfADIAZINE (SILVADENE) 1 % topical cream  Topical BID   ??? insulin glargine (LANTUS) injection 20 Units  20 Units SubCUTAneous ACB   ??? sodium hypochlorite (1/4 STRENGTH DAKINS) irrigation (bottle)   Topical DAILY   ??? pantothenic ac-min oil-pet,hyd (AQUAPHOR) 41 % ointment   Topical TID   ??? aspirin chewable tablet 81 mg  81 mg Oral DAILY   ??? levothyroxine (SYNTHROID) tablet 150 mcg  150 mcg Oral ACB   ??? misoprostol (CYTOTEC) tablet 200 mcg  200 mcg Oral BID WITH MEALS   ??? simvastatin (ZOCOR) tablet 20 mg  20 mg Oral QHS   ??? terazosin (HYTRIN) capsule 10 mg  10 mg Oral QHS   ??? 0.9% sodium chloride infusion  50 mL/hr IntraVENous CONTINUOUS   ??? SALINE PERIPHERAL FLUSH Q8H Soln 5 mL  5 mL InterCATHeter Q8H   ??? saline peripheral flush Soln 5 mL  5 mL InterCATHeter PRN   ??? acetaminophen (TYLENOL) tablet 650 mg  650 mg Oral Q6H PRN   ??? acetaminophen (TYLENOL) suppository 650 mg  650 mg Rectal Q4H PRN   ??? oxyCODONE-acetaminophen (PERCOCET) 5-325 mg per tablet 1 Tab  1 Tab Oral Q6H PRN   ??? morphine injection 2 mg  2 mg IntraVENous Q4H PRN   ??? naloxone (NARCAN) injection 0.4 mg  0.4 mg IntraVENous PRN    ??? acetaminophen (TYLENOL) tablet 650 mg  650 mg Oral Q6H PRN   ??? diphenhydrAMINE (BENADRYL) capsule 25 mg  25 mg Oral Q4H PRN   ??? ondansetron (ZOFRAN) injection 4 mg  4 mg IntraVENous Q4H PRN   ??? magnesium hydroxide (MILK OF MAGNESIA) oral suspension 30 mL  30 mL Oral DAILY PRN   ??? docusate sodium (COLACE) capsule 100 mg  100 mg Oral BID   ??? bisacodyl (DULCOLAX) suppository 10 mg  10 mg Rectal DAILY PRN   ??? glucose chewable tablet 16 g  4 Tab Oral PRN   ??? dextrose (D50W) injection Syrg 12.5-25 g  12.5-25 g IntraVENous PRN   ??? glucagon (GLUCAGEN) injection 1 mg  1 mg IntraMUSCular PRN   ??? pantoprazole (PROTONIX) tablet 40 mg  40 mg Oral ACB   ??? promethazine (PHENERGAN) 12.5 mg in NS 50 ml IVPB  12.5 mg IntraVENous Q8H PRN   ??? famotidine (PEPCID) tablet 20 mg  20 mg Oral ACB&D   ??? heparin 25,000 units in D5W 250 ml infusion  12-25 Units/kg/hr IntraVENous TITRATE          Lab Review:     Recent Labs   Basename 05/18/11 0300    WBC 7.2    HGB 9.8*    HCT 29.4*    PLT 133*     Recent Labs   Basename 05/19/11 0330 05/18/11 1300 05/18/11 0300    NA 134* 133* 131*    K 4.6 4.2 4.4    CL 102 97 96*    CO2 26 28 25     GLU 225* 236* 210*    BUN 41* 49* 55*    CREA 2.03* 3.14* 3.91*    CA 8.6 8.6 8.7    MG 2.0 -- 2.1    PHOS 3.1 3.4 3.8    ALB 2.9* 3.0* --    TBIL -- -- --    SGOT -- -- --    INR -- -- --     Lab Results   Component Value Date/Time    POC GLUCOSE 278 05/20/2011  5:05 AM    POC GLUCOSE 310 05/19/2011  8:18 PM  POC GLUCOSE 80 05/19/2011  4:30 PM    POC GLUCOSE 229 05/18/2011 10:01 PM    POC GLUCOSE 181 05/18/2011  4:20 PM          Assessment / Plan:     70 yo hx of CAD s/p stents, DM, CKD 3, diastolic CHF, presented w/ NSTEMI, DKA     1) NSTEMI (non-ST elevated myocardial infarction)/Chest pain, unspecified: Trop peaked at 15. Cont IVCU care. Cont O2, morphine, ASA, nitro, BB, heparin gtt.  Follows cardiac enzymes. Cards following, plan for cath today     2) DKA, type 2, not at goal: due to medical non-compliance. Had been followed by Dr. Angelina Ok, endocrine, but has not seen her in a while.  DKA improved with insulin gtt.  Cont Lantus at 20u, premeal aspart, increase prn.  A1C 10.5% .  Endocrine and DTC already evaluated the patient. He will need to f/u with Dr. Angelina Ok on discharge.  BS now stable    3) Acute renal failure (ARF)/CKD 3: likely due dehydration and progression of CKD. Cont gently IVF. Will cont to hold bumex and lisinopril.  Renal following    4) Acute on chronic diastolic congestive heart failure: Last echo in 2011 with EF of 60%. BNP 2,000 on admission w/ pulm edema on CXR. Given poor renal function, will give IV diuretics prn.  Holding home bumex, lisinopril     5) Unspecified hypothyroidism: cont synthroid     6) Left foot wound: wound care following.  Xray neg for osteo    7) PVD/leg pain: ABI of 0.34 on right and 0.38 on left.  Vascular following.  Plan for possible Angio after cath    8) Encephalopathy: unclear etiology.  Could be due to delirium, with possible underlying dementia.  Head CT showed chronic ischemic changes. Neuro to eval.  Cont to monitor mental status    Full Code    Total time spent with patient: 30 Minutes (prolonged)                 Care Plan discussed with: Patient    Discussed:  Care Plan    Prophylaxis:  Hep SQ    Disposition:  Home w/Family           ___________________________________________________    Attending Physician: Twanna Hy. Krishang Reading, MD

## 2011-05-20 NOTE — Progress Notes (Addendum)
Code atlas called.    Per RN, patient confused and trying to get out of bed.  Patient calm in bed upon arrival.  However, does not know where he is or year.  Patient advised that he is in the hospital, but does not know why.  Denies pain.  Denies cp or sob.  RN reports patient received morphine at midnight.  RN reports that daughter did report that patient seemed to have more episodes of confusion prior to admission as well.    Neuro:  CN 3-12 grossly intact.  Grip 4/5 b/l.  Lifts legs off bed with 4/5 strength b/l.  Psych:  Poor insight.  Alert.  Oriented to self only.  Cv:  Regular.  2/6 murmur.  Abd:  +bs. Soft, nt.    A/P:  Encephalopathy:  Likely with underlying dementia exacerbated by current medical condition and medications.  Currently, calm and cooperative, but still confused.  -- minimize use of morphine  -- head CT ordered by cardiology  -- neuro checks  -- neurology eval  -- would not use haldol with QT prolongation    approx time spent is 20 min.

## 2011-05-20 NOTE — Progress Notes (Signed)
[facility] Progress Note  [facility]   [address] [place] [address], [place] [address]   [phone]      Assessment & Plan:   1.  WGN:FAOZHY yesterday.  ?? Non oliguric  ?? No cr today  ?? DW Pt:he is ok with Labs  2.  CKD 3  3. HTN  4. CAD  5. NSTEMI  6. DM  7. Anemia       Subjective:   CHIEF COMPLAIN: f/up for ARF  HPI: Patient seen.  Had confusion last night  CT head done. Some micro changes.  nausea or vomiting      Patient denies any Chest pain/palpitations.      Patient denies any shortness of breath.      Current Facility-Administered Medications   Medication Dose Route Frequency   ??? insulin lispro (HUMALOG) injection 8 Units  8 Units SubCUTAneous TIDAC   ??? acetylcysteine (MUCOMYST) 200 mg/mL (20 %) solution 1,200 mg  1,200 mg Oral BID   ??? metoprolol (LOPRESSOR) tablet 50 mg  50 mg Oral Q6H   ??? cloNIDine (CATAPRES) tablet 0.1 mg  0.1 mg Oral Q6H PRN   ??? metoprolol (LOPRESSOR) tablet 50 mg  50 mg Oral NOW   ??? nitroglycerin (NITROBID) 2 % ointment 1 Inch  1 Inch Topical BID   ??? silver sulfADIAZINE (SILVADENE) 1 % topical cream   Topical BID   ??? insulin glargine (LANTUS) injection 20 Units  20 Units SubCUTAneous ACB   ??? sodium hypochlorite (1/4 STRENGTH DAKINS) irrigation (bottle)   Topical DAILY   ??? pantothenic ac-min oil-pet,hyd (AQUAPHOR) 41 % ointment   Topical TID   ??? aspirin chewable tablet 81 mg  81 mg Oral DAILY   ??? levothyroxine (SYNTHROID) tablet 150 mcg  150 mcg Oral ACB   ??? misoprostol (CYTOTEC) tablet 200 mcg  200 mcg Oral BID WITH MEALS   ??? simvastatin (ZOCOR) tablet 20 mg  20 mg Oral QHS   ??? terazosin (HYTRIN) capsule 10 mg  10 mg Oral QHS   ??? 0.9% sodium chloride infusion  50 mL/hr IntraVENous CONTINUOUS   ??? SALINE PERIPHERAL FLUSH Q8H Soln 5 mL  5 mL InterCATHeter Q8H   ??? saline peripheral flush Soln 5 mL  5 mL InterCATHeter PRN   ??? acetaminophen (TYLENOL) tablet 650 mg  650 mg Oral Q6H PRN    ??? acetaminophen (TYLENOL) suppository 650 mg  650 mg Rectal Q4H PRN   ??? oxyCODONE-acetaminophen (PERCOCET) 5-325 mg per tablet 1 Tab  1 Tab Oral Q6H PRN   ??? morphine injection 2 mg  2 mg IntraVENous Q4H PRN   ??? naloxone (NARCAN) injection 0.4 mg  0.4 mg IntraVENous PRN   ??? acetaminophen (TYLENOL) tablet 650 mg  650 mg Oral Q6H PRN   ??? diphenhydrAMINE (BENADRYL) capsule 25 mg  25 mg Oral Q4H PRN   ??? ondansetron (ZOFRAN) injection 4 mg  4 mg IntraVENous Q4H PRN   ??? magnesium hydroxide (MILK OF MAGNESIA) oral suspension 30 mL  30 mL Oral DAILY PRN   ??? docusate sodium (COLACE) capsule 100 mg  100 mg Oral BID   ??? bisacodyl (DULCOLAX) suppository 10 mg  10 mg Rectal DAILY PRN   ??? glucose chewable tablet 16 g  4 Tab Oral PRN   ??? dextrose (D50W) injection Syrg 12.5-25 g  12.5-25 g IntraVENous PRN   ??? glucagon (GLUCAGEN) injection 1 mg  1 mg IntraMUSCular PRN   ??? pantoprazole (PROTONIX) tablet  40 mg  40 mg Oral ACB   ??? promethazine (PHENERGAN) 12.5 mg in NS 50 ml IVPB  12.5 mg IntraVENous Q8H PRN   ??? famotidine (PEPCID) tablet 20 mg  20 mg Oral ACB&D   ??? heparin 25,000 units in D5W 250 ml infusion  12-25 Units/kg/hr IntraVENous TITRATE        Review of Systems:  Pertinent items are noted in HPI.    Objective:     Vitals:  Blood pressure 179/81, pulse 92, temperature 98.6 ??F (37 ??C), resp. rate 21, height 5\' 6"  (1.676 m), weight 84.823 kg (187 lb), SpO2 98.00%.  Temp (24hrs), Avg:98.7 ??F (37.1 ??C), Min:98.3 ??F (36.8 ??C), Max:99.2 ??F (37.3 ??C)      Intake and Output:     12/22 1900 - 12/24 0659  In: 5468.8 [P.O.:1120; I.V.:4348.8]  Out: 4125 [Urine:4125]    Physical Exam:                Patient is intubated:  no    Physical Examination:   GENERAL ASSESSMENT: NAD  HEENT:Nontraumatic   CHEST: CTA  HEART: S1S2  ABDOMEN: Soft,NT,  YN:WGNFA: none  EXTREMITY: EDEMA  NEURO:Grossly non focal          ECG/rhythm:  nsr  Data Review      No results found for this basename: ITNL in the last 72 hours   Recent Labs    Basename 05/18/11 0300 05/17/11 1030    CPK 335* 434*    CKMB -- --    TROIQ 5.30* 10.75*     Recent Labs   Basename 05/19/11 0330 05/18/11 1300 05/18/11 0300    NA 134* 133* 131*    K 4.6 4.2 4.4    CL 102 97 96*    CO2 26 28 25     BUN 41* 49* 55*    CREA 2.03* 3.14* 3.91*    GLU 225* 236* 210*    PHOS 3.1 3.4 3.8    MG 2.0 -- 2.1    ALB 2.9* 3.0* --    WBC -- -- 7.2    HGB -- -- 9.8*    HCT -- -- 29.4*    PLT -- -- 133*     No results found for this basename: INR:3,PTP:3,APTT:3, in the last 72 hours  Needs: urine analysis, urine sodium, protein and creatinine  Lab Results   Component Value Date/Time    Creatinine,urine random 33.7 05/31/2009  7:20 PM         Discussed with:  Nursing, pt          [facility]  [address]  [place], [place] [address]  Phone - [phone]  Fax - [phone]

## 2011-05-20 NOTE — Discharge Summary (Signed)
This is an interim summary from 12/20 to 12/24    Admission date: 12/20  Admission diagnosis: DKA, NSTEMI, ARF    70 yo hx of CAD s/p stents, DM, CKD 3, diastolic CHF, presented w/ NSTEMI, DKA     1) NSTEMI (non-ST elevated myocardial infarction)/Chest pain, unspecified: Trop peaked at 15. Cont IVCU care. Cont O2, morphine, ASA, nitro, BB, heparin gtt.  Follows cardiac enzymes. Cards following, plan for cath today    2) DKA, type 2, not at goal: due to medical non-compliance. Had been followed by Dr. Angelina Ok, endocrine, but has not seen her in a while.  DKA improved with insulin gtt.  Cont Lantus at 20u, premeal aspart, increase prn.  A1C 10.5% .  Endocrine and DTC already evaluated the patient. He will need to f/u with Dr. Angelina Ok on discharge.  BS now stable    3) Acute renal failure (ARF)/CKD 3: likely due dehydration and progression of CKD. Cont gently IVF. Will cont to hold bumex and lisinopril.  Renal following    4) Acute on chronic diastolic congestive heart failure: Last echo in 2011 with EF of 60%. BNP 2,000 on admission w/ pulm edema on CXR. Given poor renal function, will give IV diuretics prn.  Holding home bumex, lisinopril     5) Unspecified hypothyroidism: cont synthroid     6) Left foot wound: wound care following.  Xray neg for osteo    7) PVD/leg pain: ABI of 0.34 on right and 0.38 on left.  Vascular following.  Plan for possible Angio after cath    8) Encephalopathy: unclear etiology.  Could be due to delirium, with possible underlying dementia.  Head CT showed chronic ischemic changes. Neuro to eval.  Cont to monitor mental status    Dispo: d/c home after cath and angio    Current meds:  Current Facility-Administered Medications   Medication Dose Route Frequency   ??? heparinized saline 2 units/mL infusion 1,000 Units  500 mL Irrigation ONCE   ??? heparinized saline 2 units/mL infusion 1,000 Units  500 mL IntraarTERial ONCE    ??? iodixanol (VISIPAQUE) 320 mg/mL contrast injection 200 mL  200 mL IntraVENous ONCE   ??? insulin lispro (HUMALOG) injection 8 Units  8 Units SubCUTAneous TIDAC   ??? acetylcysteine (MUCOMYST) 200 mg/mL (20 %) solution 1,200 mg  1,200 mg Oral BID   ??? metoprolol (LOPRESSOR) tablet 50 mg  50 mg Oral Q6H   ??? metoprolol (LOPRESSOR) tablet 50 mg  50 mg Oral NOW   ??? nitroglycerin (NITROBID) 2 % ointment 1 Inch  1 Inch Topical BID   ??? silver sulfADIAZINE (SILVADENE) 1 % topical cream   Topical BID   ??? insulin glargine (LANTUS) injection 20 Units  20 Units SubCUTAneous ACB   ??? sodium hypochlorite (1/4 STRENGTH DAKINS) irrigation (bottle)   Topical DAILY   ??? pantothenic ac-min oil-pet,hyd (AQUAPHOR) 41 % ointment   Topical TID   ??? aspirin chewable tablet 81 mg  81 mg Oral DAILY   ??? levothyroxine (SYNTHROID) tablet 150 mcg  150 mcg Oral ACB   ??? misoprostol (CYTOTEC) tablet 200 mcg  200 mcg Oral BID WITH MEALS   ??? simvastatin (ZOCOR) tablet 20 mg  20 mg Oral QHS   ??? terazosin (HYTRIN) capsule 10 mg  10 mg Oral QHS   ??? 0.9% sodium chloride infusion  50 mL/hr IntraVENous CONTINUOUS   ??? SALINE PERIPHERAL FLUSH Q8H Soln 5 mL  5 mL InterCATHeter Q8H   ??? docusate sodium (  COLACE) capsule 100 mg  100 mg Oral BID   ??? pantoprazole (PROTONIX) tablet 40 mg  40 mg Oral ACB   ??? famotidine (PEPCID) tablet 20 mg  20 mg Oral ACB&D   ??? heparin 25,000 units in D5W 250 ml infusion  12-25 Units/kg/hr IntraVENous TITRATE

## 2011-05-20 NOTE — Progress Notes (Signed)
Bedside and Verbal shift change report given to AGraham, RN (oncoming nurse) by KMiller, RN (offgoing nurse).  Report given with SBAR, Kardex and MAR.

## 2011-05-20 NOTE — Progress Notes (Signed)
TRANSFER - IN REPORT:    Verbal report received from IVCU on Travis [name]  being received from IVCU(unit) for routine progression of care      Report consisted of patient???s Situation, Background, Assessment and   Recommendations(SBAR).     Information from the following report(s) SBAR was reviewed with the receiving nurse.    Opportunity for questions and clarification was provided.      Assessment completed upon patient???s arrival to unit and care assumed.

## 2011-05-20 NOTE — Progress Notes (Signed)
Episode of confusion, agitation last night,  CT head neg  Now  In cath lab , crea better  Will reeval foot tomorrow

## 2011-05-20 NOTE — Progress Notes (Signed)
Patient is resting in chair patient denies having any chest pain or discomfort at this time. Patient left groin has dressing which there is no hematoma, no bleeding. Will continue to monitor patient for changes in his condition.    2200 Patient blood glucose was 125 no sliding scale snack was given.    2240 Patient request pain medication but he can't have it til 0030. Will give medication then.    0000 Patient has a slight temp 99.0 left groin no change and patient is eating a snack and drinking coffee. Pain medication was given for pain 3/10. Will continue to monitor patient for changes in his condition.    1610 patient is resting in bed his left groin there are no changes. He does complain of right knee pain. Morphine was given and blood was drawn and sent to lab. Will continue to monitor patient for changes in condition.    9604 Patient sleeping resting comfortable

## 2011-05-20 NOTE — Progress Notes (Addendum)
Pt had a bad night according to night RN. Confused and combative. Refusing lab drawn earlier. Was able to get it this am. Dr. Elmon Kirschner seeing pt to assess whether to cath pt or not. Decision made to cath pt. Stable for procedure.    0830 - pt calmer now. Agreed to have labs drawn.    1030 - pt returned from cath. Report received from [place], RN cath lab. Upset that nothing could be done for him. Reassured pt. Will continue to monitor.    1200 - Blood sugar 364. Call to Dr. Lorette Ang. Order received to give 8 units of insulin as previously ordered. No further orders received.    1600 - pt sitting up in bed eating. Was just given bad news by family that his son was diagnosed with leukemia. Will watch pt closely.    1630 - Dressing changed on left toe per order. Done late due to cardiac cath.    1800 - sitting in chair. Back to baseline mental status.

## 2011-05-20 NOTE — Other (Signed)
Cardiopulmonary Rehab:  Printed post cardiac cath teaching handout provided for patient.  Patient resting, eating lunch post cath.  Will follow.

## 2011-05-20 NOTE — Progress Notes (Signed)
TRANSFER - OUT REPORT:    Verbal report given to Kristine on Travis [name]  being transferred to IVCU(unit) for routine post - op       Report consisted of patient???s Situation, Background, Assessment and   Recommendations(SBAR).     Information from the following report(s) SBAR was reviewed with the receiving nurse.    Opportunity for questions and clarification was provided.

## 2011-05-21 LAB — GLUCOSE, POC
Glucose (POC): 125 mg/dL — ABNORMAL HIGH (ref 75–110)
Glucose (POC): 354 mg/dL — ABNORMAL HIGH (ref 75–110)
Glucose (POC): 251 mg/dL — ABNORMAL HIGH (ref 75–110)
Glucose (POC): 41 mg/dL — CL (ref 75–110)
Glucose (POC): 42 mg/dL — CL (ref 75–110)
Glucose (POC): 60 mg/dL — ABNORMAL LOW (ref 75–110)
Glucose (POC): 89 mg/dL (ref 75–110)

## 2011-05-21 LAB — CBC W/O DIFF
HCT: 27.7 % — ABNORMAL LOW (ref 36.6–50.3)
HGB: 9.2 g/dL — ABNORMAL LOW (ref 12.1–17.0)
MCH: 29.2 PG (ref 26.0–34.0)
MCHC: 33.2 g/dL (ref 30.0–36.5)
MCV: 87.9 FL (ref 80.0–99.0)
PLATELET: 127 10*3/uL — ABNORMAL LOW (ref 150–400)
RBC: 3.15 M/uL — ABNORMAL LOW (ref 4.10–5.70)
RDW: 15 % — ABNORMAL HIGH (ref 11.5–14.5)
WBC: 5.6 10*3/uL (ref 4.1–11.1)

## 2011-05-21 MED ORDER — DEXTROSE 50% IN WATER (D50W) IV SYRG
INTRAVENOUS | Status: DC | PRN
Start: 2011-05-21 — End: 2011-05-22

## 2011-05-21 MED ORDER — INSULIN LISPRO 100 UNIT/ML INJECTION
100 unit/mL | Freq: Four times a day (QID) | SUBCUTANEOUS | Status: DC
Start: 2011-05-21 — End: 2011-05-22
  Administered 2011-05-21 – 2011-05-22 (×3): via SUBCUTANEOUS

## 2011-05-21 NOTE — Progress Notes (Signed)
Travis Altes, MD, [facility]  [facility]  [address]  Office [phone]      IMPRESSION and RECOMMENDATIONS     1.  CAD:  Occluded RCA and LCx.  Pursuing med mgmt.  Continue ASA.  Would like to start plavix after PVD issue is resolved.                              Subjective:       Pt without comaplints.        Objective:   Patient Vitals for the past 16 hrs:   BP Temp Pulse Resp SpO2 Weight   05/21/11 0727 158/55 mmHg 98.4 ??F (36.9 ??C) 80  16  - -   05/21/11 0507 144/57 mmHg 98.4 ??F (36.9 ??C) 74  16  98 % 190 lb 1.6 oz (86.229 kg)   05/21/11 0018 151/53 mmHg - 76  - - -   05/21/11 0017 - - - 26  99 % -   05/21/11 0000 151/53 mmHg 99.1 ??F (37.3 ??C) 76  18  99 % -   05/20/11 2129 152/60 mmHg - 76  - - -   05/20/11 2126 152/60 mmHg - 76  - - -   05/20/11 1953 139/55 mmHg 98.3 ??F (36.8 ??C) 70  26  99 % -       HEENT Exam:     WNL         Lung Exam:     The patient is not dyspneic.Breath sounds are heard equally in all lung fields. There are no wheezes, rales, rhonchi, or rubs heard on auscultation.          Heart Exam:     The rhythm is regular. The PMI is in the 5th intercostal space of the MCL. Apical impulse is normal. S1 is regular. S2 is physiologic. There is no S3, S4 gallop, murmur, click, or rub.          Abdomen Exam:     Benign.         Extremities Exam:     No cyanosis, clubbing, edema.            Lab Results   Component Value Date/Time    Glucose 309 05/20/2011  8:20 AM    Sodium 135 05/20/2011  8:20 AM    Potassium 4.7 05/20/2011  8:20 AM    Chloride 103 05/20/2011  8:20 AM    CO2 26 05/20/2011  8:20 AM    BUN 25 05/20/2011  8:20 AM    Creatinine 1.34 05/20/2011  8:20 AM    Calcium 8.3 05/20/2011  8:20 AM     Recent Labs   Basename 05/21/11 0455 05/20/11 0820    WBC 5.6 5.0    HGB 9.2* 9.5*    HCT 27.7* 27.9*    PLT 127* 124*     Recent Labs   Basename 05/20/11 0820 05/19/11 0330    SGOT -- --    GPT -- --    AP -- --    TBIL -- --    TP -- --     ALB 2.7* 2.9*    GLOB -- --    GGT -- --     Recent Labs   [facility] 05/20/11 0820    INR 1.1    PTP 11.7    APTT --      No results found for this basename: CPK:2,CKMB:2,TROPONINI:2,ITNL:2 in  the last 72 hours  No results found for this basename: TROIQ in the last 72 hours  Lab Results   Component Value Date/Time    Cholesterol, total 123 05/17/2011  2:22 AM    HDL Cholesterol 55 05/17/2011  2:22 AM    LDL, calculated 58.4 05/17/2011  2:22 AM    Triglyceride 48 05/17/2011  2:22 AM    CHOL/HDL Ratio 2.2 05/17/2011  9:14 AM

## 2011-05-21 NOTE — Progress Notes (Addendum)
1920 Bedside and Verbal shift change report given to Juline Patch RN (oncoming nurse) by Danton Sewer RN (offgoing nurse).  Report given with SBAR, Intake/Output, MAR and Recent Results.   2203 Blood Glucose 311 mg/dl gave 12 units Humalog subq and snack  0023 Pt incontinent of urine x1. Bed linens  And gown changed  0715 Bedside and Verbal shift change report given to Percell Boston RN (oncoming nurse) by Juline Patch RN (offgoing nurse).  Report given with SBAR, Intake/Output, MAR, Accordion and Recent Results.

## 2011-05-21 NOTE — Progress Notes (Signed)
No CP/SOB  Up in chair  coro cath result noted  3vs disease: med mgnmt   left toe sore, tnederness at base of toe, and metatarsal head    I am concerned about osteo    Will get 3-phase bonescan tomorrow  Angio later in the week, after constrast holiday

## 2011-05-21 NOTE — Progress Notes (Signed)
Bedside and Verbal shift change report given to Kathleen, RN  (oncoming nurse) by Anita, RN  (offgoing nurse).  Report given with SBAR, Kardex, Procedure Summary, MAR and Recent Results.

## 2011-05-21 NOTE — Progress Notes (Addendum)
Bedside and Verbal shift change report given to [employer] (Cabin crew) by Synetta Fail (offgoing nurse).  Report given with Kardex, ED Summary and MAR.      1545 pt blood sugar 41  Gave snack phoned Dr. Eulah Pont orders received, will recheck

## 2011-05-21 NOTE — Progress Notes (Signed)
[facility]  [address] [place], [place] [address]  [phone]      Medical Progress Note      NAME: Travis [name]   DOB:  [birth year]  MRM:  [id number]    Date/Time: 05/21/2011  9:52 AM       Assessment and Plan:     NSTEMI (non-ST elevated myocardial infarction) / CAD (coronary artery disease) - Appreciate cardiology assistance.  Cath yesterday showed CAD that cannot be addressed by stents.  Continue ASA and Plavix. Cath 1999 distal disease in the apical LAD and right coronary artery with a normal ejection fraction. Non q MI in 7/06.cath - mod LAD abn RCA disease up to 50-60% , small occluded LCX with normal LVEF.   Continue ASA, Plavix, BB, prn NTG    Ischemic toe ulcer - Left first toe.  Appreciate vascular input and their concern for osteo.  I see their order for 3 phase scan.  Patient has stated his desire for a second opinion, and I have consulted Dr Sheliah Hatch.  Slow healing ulcer likely due to DM/PAD.    DM with renal complications / DKA, type 2, not at goal - BG still high.  Continue DM diet, and scheduled Lantus and Lispro, and will add SSI per protocol.    Acute renal failure (ARF) / Chronic kidney disease, stage III (moderate) - Appreciate nephrology assistance. Probably near baseline .  Follow with gentle hydration.    PAD (peripheral artery disease) - PVR tracing 05/18/2011 suggest bilateral SFA occlusion and calf vessel disease and high grade stenosis of his proximal fem-pop Angio March 2012 (RVC): r sfa-ak pop with prox stenosis, 1 vs r/o; left diffuse sfa , severe tpt disease    HTN (hypertension) - Continue metoprolol, catapress, terazosin    Unspecified hypothyroidism - Continue synthroid.    GERD - Continue combination PPI, H2B, mioprostol.    Hyperlipidemia - Continue Zocor          Subjective:     Chief Complaint:  Feels well, awaiting osteo study.  Would like a different vascular MD.    ROS:  (bold if positive, if negative)       Tolerating PT   Tolerating Diet        Objective:     Last 24hrs VS reviewed since prior progress note. Most recent are:    Visit Vitals   Item Reading   ??? BP 158/55   ??? Pulse 80   ??? Temp 98.4 ??F (36.9 ??C)   ??? Resp 16   ??? Ht 5\' 6"  (1.676 m)   ??? Wt 190 lb 1.6 oz   ??? BMI 30.68 kg/m2   ??? SpO2 98%     SpO2 Readings from Last 6 Encounters:   05/21/11 98%   11/13/09 98%   11/13/09 98%   11/13/09 98%   10/17/09 100%   10/15/09 98%    O2 Flow Rate (L/min): 2 l/min     Intake/Output Summary (Last 24 hours) at 05/21/11 2130  Last data filed at 05/21/11 0730   Gross per 24 hour   Intake    500 ml   Output   1950 ml   Net  -1450 ml        Physical Exam:    Gen:  Well-developed, well-nourished, in no acute distress  HEENT:  Pink conjunctivae, PERRL, hearing intact to voice, moist mucous membranes  Neck:  Supple, without masses, thyroid non-tender  Resp:  No accessory muscle use, clear breath sounds without wheezes rales or rhonchi  Card:  No murmurs, normal S1, S2 without thrills, bruits or peripheral edema  Abd:  Soft, non-tender, non-distended, normoactive bowel sounds are present, no mass  Lymph:  No cervical or inguinal adenopathy  Musc:  No cyanosis or clubbing  Skin:  L great toe ulcers, skin turgor is good  Neuro:  Cranial nerves are grossly intact, mild motor weakness, follows commands appropriately  Psych:  Moderate insight, oriented to person, place and time, alert    Telemetry reviewed:   normal sinus rhythm  __________________________________________________________________  Medications Reviewed: (see below)  Medications:     Current Facility-Administered Medications   Medication Dose Route Frequency   ??? insulin lispro (HUMALOG) injection   SubCUTAneous AC&HS   ??? dextrose (D50W) injection Syrg 12.5-25 g  12.5-25 g IntraVENous PRN   ??? iodixanol (VISIPAQUE) 320 mg/mL contrast injection 200 mL  200 mL IntraVENous ONCE   ??? acetaminophen (TYLENOL) tablet 650 mg  650 mg Oral Q4H PRN    ??? morphine injection 2 mg  2 mg IntraVENous Q4H PRN   ??? diphenhydrAMINE (BENADRYL) injection 50 mg  50 mg IntraVENous Q4H PRN   ??? famotidine (PF) (PEPCID) injection 20 mg  20 mg IntraVENous 1 TIME PRN   ??? hydrocortisone Sod Succ (PF) (SOLU-CORTEF) injection 100 mg  100 mg IntraVENous 1 TIME PRN   ??? ondansetron (ZOFRAN) injection 4 mg  4 mg IntraVENous 1 TIME PRN   ??? 0.9% sodium chloride infusion  75 mL/hr IntraVENous CONTINUOUS   ??? promethazine (PHENERGAN) 12.5 mg in 0.9% sodium chloride 50 mL IVPB  12.5 mg IntraVENous Q4H PRN   ??? insulin lispro (HUMALOG) injection 8 Units  8 Units SubCUTAneous TIDAC   ??? acetylcysteine (MUCOMYST) 200 mg/mL (20 %) solution 1,200 mg  1,200 mg Oral BID   ??? metoprolol (LOPRESSOR) tablet 50 mg  50 mg Oral Q6H   ??? cloNIDine (CATAPRES) tablet 0.1 mg  0.1 mg Oral Q6H PRN   ??? nitroglycerin (NITROBID) 2 % ointment 1 Inch  1 Inch Topical BID   ??? silver sulfADIAZINE (SILVADENE) 1 % topical cream   Topical BID   ??? insulin glargine (LANTUS) injection 20 Units  20 Units SubCUTAneous ACB   ??? sodium hypochlorite (1/4 STRENGTH DAKINS) irrigation (bottle)   Topical DAILY   ??? pantothenic ac-min oil-pet,hyd (AQUAPHOR) 41 % ointment   Topical TID   ??? aspirin chewable tablet 81 mg  81 mg Oral DAILY   ??? levothyroxine (SYNTHROID) tablet 150 mcg  150 mcg Oral ACB   ??? misoprostol (CYTOTEC) tablet 200 mcg  200 mcg Oral BID WITH MEALS   ??? simvastatin (ZOCOR) tablet 20 mg  20 mg Oral QHS   ??? terazosin (HYTRIN) capsule 10 mg  10 mg Oral QHS   ??? SALINE PERIPHERAL FLUSH Q8H Soln 5 mL  5 mL InterCATHeter Q8H   ??? saline peripheral flush Soln 5 mL  5 mL InterCATHeter PRN   ??? acetaminophen (TYLENOL) tablet 650 mg  650 mg Oral Q6H PRN   ??? acetaminophen (TYLENOL) suppository 650 mg  650 mg Rectal Q4H PRN   ??? oxyCODONE-acetaminophen (PERCOCET) 5-325 mg per tablet 1 Tab  1 Tab Oral Q6H PRN   ??? morphine injection 2 mg  2 mg IntraVENous Q4H PRN   ??? naloxone (NARCAN) injection 0.4 mg  0.4 mg IntraVENous PRN    ??? acetaminophen (TYLENOL) tablet 650 mg  650 mg Oral Q6H PRN   ???  diphenhydrAMINE (BENADRYL) capsule 25 mg  25 mg Oral Q4H PRN   ??? ondansetron (ZOFRAN) injection 4 mg  4 mg IntraVENous Q4H PRN   ??? magnesium hydroxide (MILK OF MAGNESIA) oral suspension 30 mL  30 mL Oral DAILY PRN   ??? docusate sodium (COLACE) capsule 100 mg  100 mg Oral BID   ??? bisacodyl (DULCOLAX) suppository 10 mg  10 mg Rectal DAILY PRN   ??? glucose chewable tablet 16 g  4 Tab Oral PRN   ??? dextrose (D50W) injection Syrg 12.5-25 g  12.5-25 g IntraVENous PRN   ??? glucagon (GLUCAGEN) injection 1 mg  1 mg IntraMUSCular PRN   ??? pantoprazole (PROTONIX) tablet 40 mg  40 mg Oral ACB   ??? promethazine (PHENERGAN) 12.5 mg in NS 50 ml IVPB  12.5 mg IntraVENous Q8H PRN   ??? famotidine (PEPCID) tablet 20 mg  20 mg Oral ACB&D        Lab Data Reviewed: (see below)  Lab Review:     Recent Labs   [facility] 05/21/11 0455 05/20/11 0820    WBC 5.6 5.0    HGB 9.2* 9.5*    HCT 27.7* 27.9*    PLT 127* 124*     Recent Labs   Basename 05/20/11 0820 05/19/11 0330 05/18/11 1300    NA 135* 134* 133*    K 4.7 4.6 4.2    CL 103 102 97    CO2 26 26 28     GLU 309* 225* 236*    BUN 25* 41* 49*    CREA 1.34* 2.03* 3.14*    CA 8.3* 8.6 8.6    MG 1.8 2.0 --    PHOS 2.9 3.1 3.4    ALB 2.7* 2.9* 3.0*    TBIL -- -- --    SGOT -- -- --    INR 1.1 -- --     Lab Results   Component Value Date/Time    POC GLUCOSE 354 05/21/2011  7:29 AM    POC GLUCOSE 125 05/20/2011  9:49 PM    POC GLUCOSE 312 05/20/2011  4:42 PM    POC GLUCOSE 364 05/20/2011 11:55 AM    POC GLUCOSE 329 05/20/2011  7:25 AM     Recent Labs   Basename 05/20/11 0820    INR 1.1     Other pertinent lab: none    Total time spent with patient: 23 Minutes                  Care Plan discussed with: Patient, Nursing Staff, Consultant/Specialist and >50% of time spent in counseling and coordination of care    Discussed:  Care Plan    Prophylaxis:  H2B/PPI    Disposition:  Home w/Family            ___________________________________________________    Attending Physician: Salvadore Dom, MD

## 2011-05-21 NOTE — Progress Notes (Signed)
Bedside and Verbal shift change report given to Rhonda,RN (oncoming nurse) by Seward Speck (offgoing nurse).  Report given with SBAR, Kardex and Recent Results.

## 2011-05-21 NOTE — Progress Notes (Signed)
Blood sugars reviewed:  Blood sugars are better in the morning and then spike after he eats.  Would recommend restarting lispro before meals at 6-7 units in addition to sliding scale as needed.  I would recommend making the sliding scale less aggressive because the patient had a low blood sugar today after a correction.  I would recommend starting at 150 +1, 175 +2, 200 +3, etc.   Pt should follow-up with me as an outpatient as it has been since August 2011 that I've seen him.

## 2011-05-21 NOTE — Progress Notes (Signed)
[facility] Progress Note  [facility]   [address] [place], [place] [address]   [phone]      Assessment & Plan:   1.  HQI:ONGEXB yesterday.  ?? Non oliguric  ?? No cr today  ?? Cr below his baseline yesterday  ?? Labs in am  2.  CKD 3  3. HTN  4. CAD:s/p cath:diffuse CAD  5. NSTEMI  6. DM  7. Anemia       Subjective:   CHIEF COMPLAIN: f/up for ARF  HPI: Patient seen.  Cath yesterday.  Patient denies any Chest pain/palpitations.    Patient denies any shortness of breath.      Current Facility-Administered Medications   Medication Dose Route Frequency   ??? insulin lispro (HUMALOG) injection   SubCUTAneous AC&HS   ??? dextrose (D50W) injection Syrg 12.5-25 g  12.5-25 g IntraVENous PRN   ??? acetaminophen (TYLENOL) tablet 650 mg  650 mg Oral Q4H PRN   ??? morphine injection 2 mg  2 mg IntraVENous Q4H PRN   ??? diphenhydrAMINE (BENADRYL) injection 50 mg  50 mg IntraVENous Q4H PRN   ??? famotidine (PF) (PEPCID) injection 20 mg  20 mg IntraVENous 1 TIME PRN   ??? hydrocortisone Sod Succ (PF) (SOLU-CORTEF) injection 100 mg  100 mg IntraVENous 1 TIME PRN   ??? ondansetron (ZOFRAN) injection 4 mg  4 mg IntraVENous 1 TIME PRN   ??? 0.9% sodium chloride infusion  75 mL/hr IntraVENous CONTINUOUS   ??? promethazine (PHENERGAN) 12.5 mg in 0.9% sodium chloride 50 mL IVPB  12.5 mg IntraVENous Q4H PRN   ??? insulin lispro (HUMALOG) injection 8 Units  8 Units SubCUTAneous TIDAC   ??? acetylcysteine (MUCOMYST) 200 mg/mL (20 %) solution 1,200 mg  1,200 mg Oral BID   ??? metoprolol (LOPRESSOR) tablet 50 mg  50 mg Oral Q6H   ??? cloNIDine (CATAPRES) tablet 0.1 mg  0.1 mg Oral Q6H PRN   ??? nitroglycerin (NITROBID) 2 % ointment 1 Inch  1 Inch Topical BID   ??? silver sulfADIAZINE (SILVADENE) 1 % topical cream   Topical BID   ??? insulin glargine (LANTUS) injection 20 Units  20 Units SubCUTAneous ACB   ??? sodium hypochlorite (1/4 STRENGTH DAKINS) irrigation (bottle)   Topical DAILY    ??? pantothenic ac-min oil-pet,hyd (AQUAPHOR) 41 % ointment   Topical TID   ??? aspirin chewable tablet 81 mg  81 mg Oral DAILY   ??? levothyroxine (SYNTHROID) tablet 150 mcg  150 mcg Oral ACB   ??? misoprostol (CYTOTEC) tablet 200 mcg  200 mcg Oral BID WITH MEALS   ??? simvastatin (ZOCOR) tablet 20 mg  20 mg Oral QHS   ??? terazosin (HYTRIN) capsule 10 mg  10 mg Oral QHS   ??? SALINE PERIPHERAL FLUSH Q8H Soln 5 mL  5 mL InterCATHeter Q8H   ??? saline peripheral flush Soln 5 mL  5 mL InterCATHeter PRN   ??? acetaminophen (TYLENOL) tablet 650 mg  650 mg Oral Q6H PRN   ??? acetaminophen (TYLENOL) suppository 650 mg  650 mg Rectal Q4H PRN   ??? oxyCODONE-acetaminophen (PERCOCET) 5-325 mg per tablet 1 Tab  1 Tab Oral Q6H PRN   ??? morphine injection 2 mg  2 mg IntraVENous Q4H PRN   ??? naloxone (NARCAN) injection 0.4 mg  0.4 mg IntraVENous PRN   ??? acetaminophen (TYLENOL) tablet 650 mg  650 mg Oral Q6H PRN   ??? diphenhydrAMINE (BENADRYL) capsule 25 mg  25 mg  Oral Q4H PRN   ??? ondansetron (ZOFRAN) injection 4 mg  4 mg IntraVENous Q4H PRN   ??? magnesium hydroxide (MILK OF MAGNESIA) oral suspension 30 mL  30 mL Oral DAILY PRN   ??? docusate sodium (COLACE) capsule 100 mg  100 mg Oral BID   ??? bisacodyl (DULCOLAX) suppository 10 mg  10 mg Rectal DAILY PRN   ??? glucose chewable tablet 16 g  4 Tab Oral PRN   ??? dextrose (D50W) injection Syrg 12.5-25 g  12.5-25 g IntraVENous PRN   ??? glucagon (GLUCAGEN) injection 1 mg  1 mg IntraMUSCular PRN   ??? pantoprazole (PROTONIX) tablet 40 mg  40 mg Oral ACB   ??? promethazine (PHENERGAN) 12.5 mg in NS 50 ml IVPB  12.5 mg IntraVENous Q8H PRN   ??? famotidine (PEPCID) tablet 20 mg  20 mg Oral ACB&D        Review of Systems:  Pertinent items are noted in HPI.    Objective:     Vitals:  Blood pressure 148/54, pulse 74, temperature 98.1 ??F (36.7 ??C), resp. rate 21, height 5\' 6"  (1.676 m), weight 86.229 kg (190 lb 1.6 oz), SpO2 98.00%.  Temp (24hrs), Avg:98.4 ??F (36.9 ??C), Min:98.1 ??F (36.7 ??C), Max:99.1 ??F (37.3 ??C)       Intake and Output:  12/25 0700 - 12/25 1859  In: -   Out: 525 [Urine:525]  12/23 1900 - 12/25 0659  In: 1911.3 [P.O.:740; I.V.:1171.3]  Out: 3600 [Urine:3600]    Physical Exam:                Patient is intubated:  no    Physical Examination:   GENERAL ASSESSMENT: NAD  HEENT:Nontraumatic   CHEST: CTA  HEART: S1S2  ABDOMEN: Soft,NT,  ZO:XWRUE: none  EXTREMITY: EDEMA  NEURO:Grossly non focal          ECG/rhythm:  nsr  Data Review      No results found for this basename: ITNL in the last 72 hours   No results found for this basename: CPK:3,CKMB:3,TROIQ:3, in the last 72 hours  Recent Labs   Basename 05/21/11 0455 05/20/11 0820 05/19/11 0330 05/18/11 1300    NA -- 135* 134* 133*    K -- 4.7 4.6 4.2    CL -- 103 102 97    CO2 -- 26 26 28     BUN -- 25* 41* 49*    CREA -- 1.34* 2.03* 3.14*    GLU -- 309* 225* 236*    PHOS -- 2.9 3.1 3.4    MG -- 1.8 2.0 --    ALB -- 2.7* 2.9* 3.0*    WBC 5.6 5.0 -- --    HGB 9.2* 9.5* -- --    HCT 27.7* 27.9* -- --    PLT 127* 124* -- --     Recent Labs   [facility] 05/20/11 0820    INR 1.1    PTP 11.7    APTT --     Needs: urine analysis, urine sodium, protein and creatinine  Lab Results   Component Value Date/Time    Creatinine,urine random 33.7 05/31/2009  7:20 PM         Discussed with:  Nursing, pt          [facility]  [address]  [place], [place] [address]  Phone - [phone]  Fax - [phone]

## 2011-05-22 LAB — RENAL FUNCTION PANEL
Albumin: 2.8 g/dL — ABNORMAL LOW (ref 3.5–5.0)
Anion gap: 6 mmol/L (ref 5–15)
BUN/Creatinine ratio: 15 (ref 12–20)
BUN: 21 MG/DL — ABNORMAL HIGH (ref 6–20)
CO2: 27 MMOL/L (ref 21–32)
Calcium: 8.7 MG/DL (ref 8.5–10.1)
Chloride: 99 MMOL/L (ref 97–108)
Creatinine: 1.44 MG/DL — ABNORMAL HIGH (ref 0.45–1.15)
GFR est AA: 59 mL/min/{1.73_m2} — ABNORMAL LOW (ref >60)
GFR est non-AA: 48 mL/min/{1.73_m2} — ABNORMAL LOW (ref >60)
Glucose: 234 MG/DL — ABNORMAL HIGH (ref 65–100)
Phosphorus: 4.6 MG/DL (ref 2.5–4.9)
Potassium: 4.4 MMOL/L (ref 3.5–5.1)
Sodium: 132 MMOL/L — ABNORMAL LOW (ref 136–145)

## 2011-05-22 LAB — EKG, 12 LEAD, INITIAL
Atrial Rate: 93 {beats}/min
Calculated P Axis: 42 degrees
Calculated R Axis: 80 degrees
Calculated T Axis: 103 degrees
Diagnosis: NORMAL
P-R Interval: 204 ms
Q-T Interval: 428 ms
QRS Duration: 132 ms
QTC Calculation (Bezet): 532 ms
Ventricular Rate: 93 {beats}/min

## 2011-05-22 LAB — CBC W/O DIFF
HCT: 28.8 % — ABNORMAL LOW (ref 36.6–50.3)
HGB: 9.5 g/dL — ABNORMAL LOW (ref 12.1–17.0)
MCH: 29 PG (ref 26.0–34.0)
MCHC: 33 g/dL (ref 30.0–36.5)
MCV: 87.8 FL (ref 80.0–99.0)
PLATELET: 133 10*3/uL — ABNORMAL LOW (ref 150–400)
RBC: 3.28 M/uL — ABNORMAL LOW (ref 4.10–5.70)
RDW: 15 % — ABNORMAL HIGH (ref 11.5–14.5)
WBC: 5.9 10*3/uL (ref 4.1–11.1)

## 2011-05-22 LAB — URINALYSIS W/ REFLEX CULTURE
Bacteria: NEGATIVE /HPF
Bilirubin: NEGATIVE
Glucose: 250 MG/DL — AB
Ketone: NEGATIVE MG/DL
Leukocyte Esterase: NEGATIVE
Nitrites: NEGATIVE
Protein: 30 MG/DL — AB
Specific gravity: 1.013 (ref 1.003–1.030)
Urobilinogen: 0.2 EU/DL (ref 0.2–1.0)
pH (UA): 5.5 (ref 5.0–8.0)

## 2011-05-22 LAB — GLUCOSE, POC
Glucose (POC): 311 mg/dL — ABNORMAL HIGH (ref 75–110)
Glucose (POC): 304 mg/dL — ABNORMAL HIGH (ref 75–110)
Glucose (POC): 401 mg/dL — ABNORMAL HIGH (ref 75–110)
Glucose (POC): 156 mg/dL — ABNORMAL HIGH (ref 75–110)

## 2011-05-22 LAB — PROTEIN URINE, RANDOM: Protein, urine random: 38 MG/DL — ABNORMAL HIGH (ref 0.0–11.9)

## 2011-05-22 LAB — CREATININE, UR, RANDOM: Creatinine, urine random: 32.49 mg/dL

## 2011-05-22 LAB — SODIUM, UR, RANDOM: Sodium,urine random: 59 MMOL/L

## 2011-05-22 MED ORDER — METOPROLOL TARTRATE 25 MG TAB
25 mg | Freq: Two times a day (BID) | ORAL | Status: DC
Start: 2011-05-22 — End: 2011-05-25
  Administered 2011-05-22 – 2011-05-25 (×9): via ORAL

## 2011-05-22 MED ORDER — INSULIN LISPRO 100 UNIT/ML INJECTION
100 unit/mL | Freq: Four times a day (QID) | SUBCUTANEOUS | Status: DC
Start: 2011-05-22 — End: 2011-05-22
  Administered 2011-05-22: 13:00:00 via SUBCUTANEOUS

## 2011-05-22 MED ORDER — ACETYLCYSTEINE 20 % (200 MG/ML) SOLN
200 mg/mL (20 %) | Freq: Two times a day (BID) | Status: AC
Start: 2011-05-22 — End: 2011-05-24
  Administered 2011-05-23 – 2011-05-24 (×2): via ORAL

## 2011-05-22 MED ORDER — LISINOPRIL 20 MG TAB
20 mg | Freq: Every day | ORAL | Status: DC
Start: 2011-05-22 — End: 2011-05-25
  Administered 2011-05-22 – 2011-05-25 (×4): via ORAL

## 2011-05-22 MED ORDER — INSULIN LISPRO 100 UNIT/ML INJECTION
100 unit/mL | Freq: Four times a day (QID) | SUBCUTANEOUS | Status: DC
Start: 2011-05-22 — End: 2011-05-25
  Administered 2011-05-22 – 2011-05-24 (×8): via SUBCUTANEOUS

## 2011-05-22 MED ORDER — INSULIN LISPRO 100 UNIT/ML INJECTION
100 unit/mL | Freq: Three times a day (TID) | SUBCUTANEOUS | Status: DC
Start: 2011-05-22 — End: 2011-05-23
  Administered 2011-05-22 – 2011-05-23 (×5): via SUBCUTANEOUS

## 2011-05-22 MED ORDER — ACETAMINOPHEN 325 MG TABLET
325 mg | Freq: Four times a day (QID) | ORAL | Status: DC | PRN
Start: 2011-05-22 — End: 2011-05-25
  Administered 2011-05-25 (×2): via ORAL

## 2011-05-22 MED ORDER — SODIUM CHLORIDE 0.9 % IV
INTRAVENOUS | Status: DC
Start: 2011-05-22 — End: 2011-05-25
  Administered 2011-05-22 – 2011-05-25 (×3): via INTRAVENOUS

## 2011-05-22 NOTE — Progress Notes (Signed)
05/22/2011 10:12A CM met with pt as a follow-up visit.Second opinion surgical consult pending. Pt is going for his 3 phase bone scan today .Pt will  benefit from home health services for wound care to lt great GNF:AOZHYQM with 1/4 dakuins,apply acticoat & dressing,telemonitoring for CHF management,and diabetes education .Once surgical consult has been completed and course of action identified,please write orders for home health to address the above.Pt is followed by the "red clinic" @ [facility].Elizabeth Aristov,RN

## 2011-05-22 NOTE — Progress Notes (Signed)
Dr. Marcy Panning called to see if urgent to see pt. I explained pt wants second opinion before having surgery tomorrow. Surgery is scheduled but have not give mucomyst or continued fluids yet nor had consent signed by pt. Dr. Marcy Panning states he will come in tonight

## 2011-05-22 NOTE — Other (Signed)
Cardiopulmonary Rehab:  Chart reviewed.  Patient has been given printed teaching materials on CHF and MI.  Will continue to follow for teaching as appropriate.

## 2011-05-22 NOTE — Progress Notes (Signed)
Shelda Altes, MD, [facility]  [facility]  [address]  Office [phone]      IMPRESSION and RECOMMENDATIONS     1.  CAD:  Occluded RCA and LCx.  Pursuing med mgmt.  Continue ASA.  Would like to start plavix after PVD issue is resolved.  Apparently, requesting a second opinion regarding left lower extremity PVD/pain.  Will d/c mucomyst at this time as well as NTP.  Change metoprolol back to home regimen of bid.  Will restart home dose on lisinopril for further BP control.  Recheck BMP.  Can restart norvasc (had been on in the past) if further BP control warranted.                              Subjective:       Pt without comaplints.        Objective:     Patient Vitals for the past 16 hrs:   BP Temp Pulse Resp SpO2 Height   05/22/11 0453 - - - - - 5\' 6"  (1.676 m)   05/22/11 0400 157/61 mmHg 98.7 ??F (37.1 ??C) 71  17  98 % -   05/22/11 0009 162/62 mmHg 99.7 ??F (37.6 ??C) 79  20  97 % -   05/21/11 2304 120/55 mmHg - 76  - - -   05/21/11 2204 158/62 mmHg - 78  - - -   05/21/11 2054 187/72 mmHg - 85  - - -   05/21/11 2005 - - - - 94 % -   05/21/11 1911 162/74 mmHg 99.7 ??F (37.6 ??C) 84  18  95 % 5\' 6"  (1.676 m)   05/21/11 1804 163/62 mmHg 98.6 ??F (37 ??C) 86  18  97 % -       HEENT Exam:     WNL         Lung Exam:     The patient is not dyspneic.Breath sounds are heard equally in all lung fields. There are no wheezes, rales, rhonchi, or rubs heard on auscultation.          Heart Exam:     The rhythm is regular. The PMI is in the 5th intercostal space of the MCL. Apical impulse is normal. S1 is regular. S2 is physiologic. There is no S3, S4 gallop, murmur, click, or rub.          Abdomen Exam:     Benign.         Extremities Exam:     No cyanosis, clubbing, edema.            Lab Results   Component Value Date/Time    Glucose 309 05/20/2011  8:20 AM    Sodium 135 05/20/2011  8:20 AM    Potassium 4.7 05/20/2011  8:20 AM    Chloride 103 05/20/2011  8:20 AM     CO2 26 05/20/2011  8:20 AM    BUN 25 05/20/2011  8:20 AM    Creatinine 1.34 05/20/2011  8:20 AM    Calcium 8.3 05/20/2011  8:20 AM     Recent Labs   Basename 05/21/11 0455 05/20/11 0820    WBC 5.6 5.0    HGB 9.2* 9.5*    HCT 27.7* 27.9*    PLT 127* 124*     Recent Labs   Basename 05/20/11 0820    SGOT --    GPT --    AP --  TBIL --    TP --    ALB 2.7*    GLOB --    GGT --     Recent Labs   [facility] 05/20/11 0820    INR 1.1    PTP 11.7    APTT --      No results found for this basename: CPK:2,CKMB:2,TROPONINI:2,ITNL:2 in the last 72 hours  No results found for this basename: TROIQ in the last 72 hours  Lab Results   Component Value Date/Time    Cholesterol, total 123 05/17/2011  2:22 AM    HDL Cholesterol 55 05/17/2011  2:22 AM    LDL, calculated 58.4 05/17/2011  2:22 AM    Triglyceride 48 05/17/2011  2:22 AM    CHOL/HDL Ratio 2.2 05/17/2011  1:61 AM

## 2011-05-22 NOTE — Progress Notes (Signed)
05/22/2011 11:06A Orders for home health received.Pt is contacting his daughter to find out what [facility] company his friend uses as he prefers to utilize this company.Will remeet with pt in one hour per his request.Elizabeth Aristov,RN

## 2011-05-22 NOTE — Progress Notes (Addendum)
Dr. Starleen Arms called to see why he needs to see pt. Asked Dr. Eulah Pont. Dr. Eulah Pont said he canceled neuro consult. Called Dr. Starleen Arms back at [phone] (# he gave me) to inform him he doesn't need to come see pt.     Also asked Dr. Eulah Pont if I am to hold Dr. Gillis Ends orders for mucomyst and IV fluids since pt wants second opinion (and may not have procedure) and he said hold. I also did not get consent signed yet. Mucomyst in pt's bin in pyxis

## 2011-05-22 NOTE — Progress Notes (Signed)
[facility]  [address], [place] [address]  [phone]      Medical Progress Note      NAME: Travis [name]   DOB:  17-Sep-1940  MRM:  [id number]    Date/Time: 05/22/2011  7:35 AM       Assessment and Plan:     NSTEMI (non-ST elevated myocardial infarction) / CAD (coronary artery disease) - Appreciate cardiology assistance.  Cath 12/24 showed CAD that cannot be addressed by stents.  Continue ASA and Plavix. Cath in 1999 showed distal disease in the apical LAD and right coronary artery with a normal ejection fraction. Non q MI in 7/06 and cath showed mod LAD abn RCA disease up to 50-60% , small occluded LCX with normal LVEF.   Continue ASA, Plavix, BB, prn NTG    Ischemic toe ulcer - Left first toe.  Appreciate vascular input and their concern for osteo.  I see their order for 3 phase scan.  Patient has stated his desire for a second opinion, and I have consulted Dr Sheliah Hatch.  Slow healing ulcer likely due to DM/PAD.  If no surgery is needed, he can likely be discharged.    DM with renal complications / DKA, type 2, not at goal - Appreciate endocrine help.  BG bottomed out last night, better this AM.  Continue DM diet, and scheduled Lantus and Lispro, and will add SSI per low dose protocol.    Acute renal failure (ARF) / Hyponatremia / Chronic kidney disease, stage III (moderate) - Appreciate nephrology assistance. Probably near baseline with Cr 1.4.  Follow with gentle hydration.    Anemia - Stable.  From chronic disease.  No active bleeding.    PAD (peripheral artery disease) -  PVR tracing 05/18/2011 suggest bilateral SFA occlusion and calf vessel disease and high grade stenosis of his proximal fem-pop Angio March 2012 ([facility]): r sfa-ak pop with prox stenosis, 1 vs r/o; left diffuse sfa , severe tpt disease    HTN (hypertension) - Continue metoprolol, catapress, terazosin, add Norvasc if needed    Unspecified hypothyroidism - Continue synthroid.     GERD - Continue combination PPI, H2B, mioprostol.    Hyperlipidemia - Continue Zocor          Subjective:     Chief Complaint:  Feels well, awaiting osteo study.  Awaiting opinion from second vascular MD.    ROS:  (bold if positive, if negative)      Tolerating PT  Tolerating Diet        Objective:     Last 24hrs VS reviewed since prior progress note. Most recent are:    Visit Vitals   Item Reading   ??? BP 158/67   ??? Pulse 72   ??? Temp 98 ??F (36.7 ??C)   ??? Resp 19   ??? Ht 5\' 6"  (1.676 m)   ??? Wt 189 lb 6.4 oz   ??? BMI 30.57 kg/m2   ??? SpO2 98%     SpO2 Readings from Last 6 Encounters:   05/22/11 98%   11/13/09 98%   11/13/09 98%   11/13/09 98%   10/17/09 100%   10/15/09 98%    O2 Flow Rate (L/min): 2 l/min       Intake/Output Summary (Last 24 hours) at 05/22/11 0735  Last data filed at 05/22/11 0442   Gross per 24 hour   Intake   1860 ml   Output   1500 ml  Net    360 ml        Physical Exam:    Gen:  Well-developed, well-nourished, in no acute distress  HEENT:  Pink conjunctivae, PERRL, hearing intact to voice, moist mucous membranes  Neck:  Supple, without masses, thyroid non-tender  Resp:  No accessory muscle use, clear breath sounds without wheezes rales or rhonchi  Card:  No murmurs, normal S1, S2 without thrills, bruits or peripheral edema  Abd:  Soft, non-tender, non-distended, normoactive bowel sounds are present, no mass  Lymph:  No cervical or inguinal adenopathy  Musc:  No cyanosis or clubbing  Skin:  L great toe ulcers, skin turgor is good  Neuro:  Cranial nerves are grossly intact, mild motor weakness, follows commands appropriately  Psych:  Moderate insight, oriented to person, place and time, alert    Telemetry reviewed:   normal sinus rhythm  __________________________________________________________________  Medications Reviewed: (see below)  Medications:     Current Facility-Administered Medications   Medication Dose Route Frequency   ??? metoprolol (LOPRESSOR) tablet 50 mg  50 mg Oral BID    ??? lisinopril (PRINIVIL, ZESTRIL) tablet 20 mg  20 mg Oral DAILY   ??? insulin lispro (HUMALOG) injection   SubCUTAneous AC&HS   ??? insulin lispro (HUMALOG) injection 6 Units  6 Units SubCUTAneous TIDAC   ??? dextrose (D50W) injection Syrg 12.5-25 g  12.5-25 g IntraVENous PRN   ??? acetaminophen (TYLENOL) tablet 650 mg  650 mg Oral Q4H PRN   ??? morphine injection 2 mg  2 mg IntraVENous Q4H PRN   ??? diphenhydrAMINE (BENADRYL) injection 50 mg  50 mg IntraVENous Q4H PRN   ??? famotidine (PF) (PEPCID) injection 20 mg  20 mg IntraVENous 1 TIME PRN   ??? hydrocortisone Sod Succ (PF) (SOLU-CORTEF) injection 100 mg  100 mg IntraVENous 1 TIME PRN   ??? ondansetron (ZOFRAN) injection 4 mg  4 mg IntraVENous 1 TIME PRN   ??? promethazine (PHENERGAN) 12.5 mg in 0.9% sodium chloride 50 mL IVPB  12.5 mg IntraVENous Q4H PRN   ??? cloNIDine (CATAPRES) tablet 0.1 mg  0.1 mg Oral Q6H PRN   ??? silver sulfADIAZINE (SILVADENE) 1 % topical cream   Topical BID   ??? insulin glargine (LANTUS) injection 20 Units  20 Units SubCUTAneous ACB   ??? sodium hypochlorite (1/4 STRENGTH DAKINS) irrigation (bottle)   Topical DAILY   ??? pantothenic ac-min oil-pet,hyd (AQUAPHOR) 41 % ointment   Topical TID   ??? aspirin chewable tablet 81 mg  81 mg Oral DAILY   ??? levothyroxine (SYNTHROID) tablet 150 mcg  150 mcg Oral ACB   ??? misoprostol (CYTOTEC) tablet 200 mcg  200 mcg Oral BID WITH MEALS   ??? simvastatin (ZOCOR) tablet 20 mg  20 mg Oral QHS   ??? terazosin (HYTRIN) capsule 10 mg  10 mg Oral QHS   ??? SALINE PERIPHERAL FLUSH Q8H Soln 5 mL  5 mL InterCATHeter Q8H   ??? saline peripheral flush Soln 5 mL  5 mL InterCATHeter PRN   ??? acetaminophen (TYLENOL) tablet 650 mg  650 mg Oral Q6H PRN   ??? acetaminophen (TYLENOL) suppository 650 mg  650 mg Rectal Q4H PRN   ??? oxyCODONE-acetaminophen (PERCOCET) 5-325 mg per tablet 1 Tab  1 Tab Oral Q6H PRN   ??? morphine injection 2 mg  2 mg IntraVENous Q4H PRN   ??? naloxone (NARCAN) injection 0.4 mg  0.4 mg IntraVENous PRN    ??? acetaminophen (TYLENOL) tablet 650 mg  650 mg Oral  Q6H PRN   ??? diphenhydrAMINE (BENADRYL) capsule 25 mg  25 mg Oral Q4H PRN   ??? ondansetron (ZOFRAN) injection 4 mg  4 mg IntraVENous Q4H PRN   ??? magnesium hydroxide (MILK OF MAGNESIA) oral suspension 30 mL  30 mL Oral DAILY PRN   ??? docusate sodium (COLACE) capsule 100 mg  100 mg Oral BID   ??? bisacodyl (DULCOLAX) suppository 10 mg  10 mg Rectal DAILY PRN   ??? glucose chewable tablet 16 g  4 Tab Oral PRN   ??? dextrose (D50W) injection Syrg 12.5-25 g  12.5-25 g IntraVENous PRN   ??? glucagon (GLUCAGEN) injection 1 mg  1 mg IntraMUSCular PRN   ??? pantoprazole (PROTONIX) tablet 40 mg  40 mg Oral ACB   ??? promethazine (PHENERGAN) 12.5 mg in NS 50 ml IVPB  12.5 mg IntraVENous Q8H PRN   ??? famotidine (PEPCID) tablet 20 mg  20 mg Oral ACB&D        Lab Data Reviewed: (see below)  Lab Review:     Recent Labs   [facility] 05/22/11 0505 05/21/11 0455 05/20/11 0820    WBC 5.9 5.6 5.0    HGB 9.5* 9.2* 9.5*    HCT 28.8* 27.7* 27.9*    PLT 133* 127* 124*     Recent Labs   Basename 05/22/11 0505 05/20/11 0820    NA 132* 135*    K 4.4 4.7    CL 99 103    CO2 27 26    GLU 234* 309*    BUN 21* 25*    CREA 1.44* 1.34*    CA 8.7 8.3*    MG -- 1.8    PHOS 4.6 2.9    ALB 2.8* 2.7*    TBIL -- --    SGOT -- --    INR -- 1.1     Lab Results   Component Value Date/Time    POC GLUCOSE 311 05/21/2011  8:35 PM    POC GLUCOSE 89 05/21/2011  4:26 PM    POC GLUCOSE 60 05/21/2011  4:02 PM    POC GLUCOSE 42 05/21/2011  3:44 PM    POC GLUCOSE 41 05/21/2011  3:39 PM     Recent Labs   Basename 05/20/11 0820    INR 1.1     Other pertinent lab: none    Total time spent with patient: 81 Minutes                  Care Plan discussed with: Patient, Nursing Staff, Consultant/Specialist and >50% of time spent in counseling and coordination of care    Discussed:  Care Plan    Prophylaxis:  H2B/PPI    Disposition:  Home w/Family           ___________________________________________________     Attending Physician: Salvadore Dom, MD

## 2011-05-22 NOTE — Progress Notes (Signed)
Up in chair  C/o left toe pain  pe unchanged  Angio tomorrow  Mucomist, ivh d/w with renal

## 2011-05-22 NOTE — Progress Notes (Signed)
05/22/2011 12:06P CM consulted with pt.'s daughter,[name] [facility] @ [phone] regarding home health services for her father.The agency of daughter's choice is [facility].With pt and daughter's permission,clinicals were faxed to [facility].Marliss Czar

## 2011-05-22 NOTE — Progress Notes (Deleted)
05/22/2011 11:56A Pt lives with her husband in a ranch-style home.Her home O2 is serviced by [employer] PCP is Dr Mariah Milling.Pt has presciption drug coverage.Pt was active up until one month ago line dancing,etc.CM will continue to follow pt throughout course of hospitalization.Pt will likely need inpt rehab vs SNF vs HH on dicharge.CM will continue to follow pt throughout course of hospitalization.Marliss Czar

## 2011-05-22 NOTE — Consults (Signed)
Name:       Travis [name], Travis [name]          Admitted:          05/16/2011                                         DOB:               05-Apr-1941  Account #:  [id number]               Age:               70  Consultant: Lenn Sink, MD         Location           [phone]                                CONSULTATION REPORT    DATE OF CONSULTATION           05/22/2011      REASON FOR CONSULTATION:  Second opinion related to peripheral vascular  disease.    HISTORY OF PRESENT ILLNESS:  The patient is a 70 year old male with a  history of diabetes, coronary artery disease and COPD amongst other medical  problems as well as peripheral vascular disease, who presented on December  20 with substernal chest pain.  He was diagnosed with a non-ST-wave MI  associated with acute renal insufficiency.  He underwent cardiac evaluation  as well as an endocrinology evaluation for significant hyperglycemia.  During his hospital course, he underwent cardiac catheterization to  determine that he had significant coronary disease, best treated with  medical therapy; and his creatinine has improved to almost normal.  Regarding his leg, he has had left great toe wound that was intervened upon  by a podiatrist for what he reports is an infection, which has not healed.  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  He has had a  proper vascular surgery assessment and planning in my opinion, and he is to  undergo a lower extremity arteriogram and possible revascularization  tomorrow; but he is having second thoughts, and I am seeing him as a second  opinion.    PAST MEDICAL HISTORY:  Significant for PVD, status post right  femoropopliteal bypass amongst revascularizations of this bypass as well as  previous coronary artery disease with intervention, chronic obstructive   pulmonary disease, diabetes, chronic renal insufficiency, thyroid disease,  GERD, history of GI bleed, hypertension.    MEDICATIONS PRIOR TO ADMISSION  1.Insulin.  2.Potassium.  3.Bumex.  4.Misoprostol.  5. Prilosec.  6. Zocor.  7.Lopressor.  8. Zestril.  9.Hytrin.  10.Aspirin 81 mg.  11.Fish oil.  12.Synthroid.  13. Nitrostat.  14. Albuterol.    ALLERGIES:  HE HAS NO KNOWN DRUG ALLERGIES.    SOCIAL HISTORY:  Positive for previous tobacco use, about a 25-pack-year  history.  Denies daily alcohol use.    REVIEW OF SYSTEMS:  Negative for fevers, chills and vision or hearing  problems.  Currently denies any chest pain or shortness of breath but does  have a cough.  No wheezing.  No hematuria or urinary complaints.  He  complains of pain in the left foot and toe and significant claudication.  No new skin rashes.  He does report swelling in the left leg.  No other  numbness, tingling or neurological changes.    PHYSICAL EXAMINATION  LAST VITAL SIGNS:  Temperature 97.8, heart rate 86, blood pressure 166/69,  99% on room air.  GENERAL:  He is currently in no acute distress.  NECK:  Supple.  No carotid bruits.  HEENT:  Sclerae are anicteric.  CHEST:  There are some course breath sounds bilaterally.  Some mild  crackles.  Equal bilaterally.  HEART:  Regular rate and rhythm.  There are no murmurs.  ABDOMEN: Soft, nondistended, nontender.  EXTREMITIES:  His lower extremities feature what appears to be a total knee  incision on the right and a saphenous vein harvest on the right, which  appears to be for his right femoropopliteal bypass.  He has nonpalpable  popliteal or pedal pulses on the right and also palpable femoral pulse.  On  the left, he has a palpable femoral pulse, no palpable popliteal or pedal  pulses.  He has a toe wound over the great toenail, that is covered with  Santyl cream, I believe.  He has got a clearly edematous foot on that side.  First metatarsal and toe are significantly tender.   PSYCHIATRIC:  He seems to be alert and appropriate to questions, although  he does not have really good insight in terms of his disease process, I  feel, or perhaps some denial.    LABORATORY/DIAGNOSTIC IMAGING:  His last labs reveal a white blood cell  count of 5.9, hemoglobin 9.5, platelet count of 133.  Chemistries:  Sodium  132, calcium 4.4, chloride 99, BUN 21, creatinine 1.44.  This is down from  3.9 on the 22nd.  He has had a CT scan of his head while here that shows  some microvascular ischemic change and an old right posterior frontal  infarct.  Otherwise, there are no pertinent findings.    ASSESSMENT AND PLAN:    The patient has severe peripheral vascular disease  bilaterally.  The most pressing concern is the left leg, because of the  open toe wound, edema and ischemic changes.  He has already been seen and  interviewed, I believe, by Dr. Carmell Austria in the past; and I have encouraged  him to continue his care with Dr. Carmell Austria to have a left leg arteriogram  tomorrow,  as this wound may pose somewhat of a limb threat in the near future.    I think that his creatinine has come down nicely and he is in a good window to have this  procedure done.  He has had some level of an MI, and I think major surgery  may require further cardiology risk assessment.  If this could be done  endovascularly, it would be ideal.  I have asked him to consider having  this done tomorrow with Dr. Carmell Austria.  Otherwise, I will check back tomorrow  to see if he will agree.    Thank you for the consultation.              Lenn Sink, MD    cc:   Lenn Sink, MD      AM/wmx; D: 05/22/2011  6:56 P; T: 05/22/2011  7:49 P; DOC# [id number]; Job#  [id number]

## 2011-05-22 NOTE — Progress Notes (Signed)
[facility] Progress Note  [facility]   [address] [place], [place] [address]   [phone]      Assessment & Plan:   1.  BJY:NWGNFA yesterday.  ?? Non oliguric  ?? cr stable today  ?? Cr neat baseline yesterday  ?? Labs in am  ?? IVF + Mucomyst prior to angio  ?? CIN dw pt  ?? Monitor mild hyponatremia  2.  CKD 3  3. HTN  4. CAD:s/p cath:diffuse CAD  5. NSTEMI  6. DM  7. Anemia       Subjective:   CHIEF COMPLAIN: f/up for ARF  HPI: Patient seen.  May go for angio  Requesting 2nd opinion from another vascular surgery  Patient denies any Chest pain/palpitations.    Patient denies any shortness of breath.  Pt denies any nausea,vomiting,diarrhea.          Current Facility-Administered Medications   Medication Dose Route Frequency   ??? metoprolol (LOPRESSOR) tablet 50 mg  50 mg Oral BID   ??? lisinopril (PRINIVIL, ZESTRIL) tablet 20 mg  20 mg Oral DAILY   ??? insulin lispro (HUMALOG) injection 6 Units  6 Units SubCUTAneous TIDAC   ??? insulin lispro (HUMALOG) injection   SubCUTAneous AC&HS   ??? acetaminophen (TYLENOL) tablet 650 mg  650 mg Oral Q6H PRN   ??? acetylcysteine (MUCOMYST) 200 mg/mL (20 %) solution 600 mg  600 mg Oral Q12H   ??? 0.9% sodium chloride infusion  75 mL/hr IntraVENous CONTINUOUS   ??? cloNIDine (CATAPRES) tablet 0.1 mg  0.1 mg Oral Q6H PRN   ??? silver sulfADIAZINE (SILVADENE) 1 % topical cream   Topical BID   ??? insulin glargine (LANTUS) injection 20 Units  20 Units SubCUTAneous ACB   ??? sodium hypochlorite (1/4 STRENGTH DAKINS) irrigation (bottle)   Topical DAILY   ??? pantothenic ac-min oil-pet,hyd (AQUAPHOR) 41 % ointment   Topical TID   ??? aspirin chewable tablet 81 mg  81 mg Oral DAILY   ??? levothyroxine (SYNTHROID) tablet 150 mcg  150 mcg Oral ACB   ??? misoprostol (CYTOTEC) tablet 200 mcg  200 mcg Oral BID WITH MEALS   ??? simvastatin (ZOCOR) tablet 20 mg  20 mg Oral QHS   ??? terazosin (HYTRIN) capsule 10 mg  10 mg Oral QHS    ??? SALINE PERIPHERAL FLUSH Q8H Soln 5 mL  5 mL InterCATHeter Q8H   ??? saline peripheral flush Soln 5 mL  5 mL InterCATHeter PRN   ??? oxyCODONE-acetaminophen (PERCOCET) 5-325 mg per tablet 1 Tab  1 Tab Oral Q6H PRN   ??? morphine injection 2 mg  2 mg IntraVENous Q4H PRN   ??? naloxone (NARCAN) injection 0.4 mg  0.4 mg IntraVENous PRN   ??? diphenhydrAMINE (BENADRYL) capsule 25 mg  25 mg Oral Q4H PRN   ??? ondansetron (ZOFRAN) injection 4 mg  4 mg IntraVENous Q4H PRN   ??? magnesium hydroxide (MILK OF MAGNESIA) oral suspension 30 mL  30 mL Oral DAILY PRN   ??? docusate sodium (COLACE) capsule 100 mg  100 mg Oral BID   ??? bisacodyl (DULCOLAX) suppository 10 mg  10 mg Rectal DAILY PRN   ??? glucose chewable tablet 16 g  4 Tab Oral PRN   ??? dextrose (D50W) injection Syrg 12.5-25 g  12.5-25 g IntraVENous PRN   ??? glucagon (GLUCAGEN) injection 1 mg  1 mg IntraMUSCular PRN   ??? pantoprazole (PROTONIX) tablet 40 mg  40 mg Oral ACB   ???  promethazine (PHENERGAN) 12.5 mg in NS 50 ml IVPB  12.5 mg IntraVENous Q8H PRN   ??? famotidine (PEPCID) tablet 20 mg  20 mg Oral ACB&D        Review of Systems:  Pertinent items are noted in HPI.    Objective:     Vitals:  Blood pressure 146/53, pulse 78, temperature 98.6 ??F (37 ??C), resp. rate 18, height 5\' 6"  (1.676 m), weight 85.957 kg (189 lb 8 oz), SpO2 98.00%.  Temp (24hrs), Avg:98.9 ??F (37.2 ??C), Min:98 ??F (36.7 ??C), Max:99.7 ??F (37.6 ??C)      Intake and Output:  12/26 0700 - 12/26 1859  In: -   Out: 350 [Urine:350]  12/24 1900 - 12/26 0659  In: 1860 [P.O.:1860]  Out: 2750 [Urine:2750]    Physical Exam:                Patient is intubated:  no    Physical Examination:   GENERAL ASSESSMENT: NAD  HEENT:Nontraumatic   CHEST: CTA  HEART: S1S2  ABDOMEN: Soft,NT,  ZO:XWRUE: none  EXTREMITY: EDEMA  NEURO:Grossly non focal          ECG/rhythm:  nsr  Data Review      No results found for this basename: ITNL in the last 72 hours   No results found for this basename: CPK:3,CKMB:3,TROIQ:3, in the last 72 hours   Recent Labs   [facility] 05/22/11 0505 05/21/11 0455 05/20/11 0820    NA 132* -- 135*    K 4.4 -- 4.7    CL 99 -- 103    CO2 27 -- 26    BUN 21* -- 25*    CREA 1.44* -- 1.34*    GLU 234* -- 309*    PHOS 4.6 -- 2.9    MG -- -- 1.8    ALB 2.8* -- 2.7*    WBC 5.9 5.6 5.0    HGB 9.5* 9.2* 9.5*    HCT 28.8* 27.7* 27.9*    PLT 133* 127* 124*     Recent Labs   Basename 05/20/11 0820    INR 1.1    PTP 11.7    APTT --     Needs: urine analysis, urine sodium, protein and creatinine  Lab Results   Component Value Date/Time    Sodium,urine random 59 05/21/2011  9:00 PM    Creatinine,urine random 32.49 05/21/2011  9:00 PM         Discussed with:  Nursing, pt          [facility]  [address]  [place], [place] [address]  Phone - [phone]  Fax - [phone]

## 2011-05-22 NOTE — Progress Notes (Signed)
Patient is unsure if he has received the PNA vaccination in the past.  Will check with daughter prior to receiving vaccination.

## 2011-05-22 NOTE — Progress Notes (Signed)
TRANSFER - OUT REPORT:    Verbal report given to Jenny(name) on Travis [name]  being transferred to PCC(unit) for routine progression of care       Report consisted of patient???s Situation, Background, Assessment and   Recommendations(SBAR).     Information from the following report(s) SBAR, Kardex, [facility] and Recent Results was reviewed with the receiving nurse.    Opportunity for questions and clarification was provided.

## 2011-05-22 NOTE — Progress Notes (Signed)
Bedside and Verbal shift change report given to Whitney RN (oncoming nurse) by Jenni Lawrence RN (offgoing nurse).  Report given with SBAR.

## 2011-05-22 NOTE — Consults (Signed)
Vascular Surgery Consult  --full note dictated  --in brief: --severe ischemia of both legs (ABI's < 0.4) with non-healing toe wound on Left  --recovering from STEMI as well as AKF during this admission  --now that Cr has improved, I agree prompt intervention with dilute contrast/limiting nephrotoxicity would be ideal as  planned by Dr. Carmell Austria: I have encouraged Travis [name] to undergo the planned procedure.

## 2011-05-23 LAB — EKG, 12 LEAD, INITIAL
Atrial Rate: 74 {beats}/min
Calculated P Axis: 53 degrees
Calculated R Axis: 112 degrees
Calculated T Axis: 106 degrees
P-R Interval: 212 ms
Q-T Interval: 482 ms
QRS Duration: 188 ms
QTC Calculation (Bezet): 535 ms
Ventricular Rate: 74 {beats}/min

## 2011-05-23 LAB — GLUCOSE, POC
Glucose (POC): 286 mg/dL — ABNORMAL HIGH (ref 75–110)
Glucose (POC): 309 mg/dL — ABNORMAL HIGH (ref 75–110)
Glucose (POC): 441 mg/dL — ABNORMAL HIGH (ref 75–110)
Glucose (POC): 133 mg/dL — ABNORMAL HIGH (ref 75–110)

## 2011-05-23 LAB — METABOLIC PANEL, BASIC
Anion gap: 6 mmol/L (ref 5–15)
BUN/Creatinine ratio: 17 (ref 12–20)
BUN: 23 MG/DL — ABNORMAL HIGH (ref 6–20)
CO2: 27 MMOL/L (ref 21–32)
Calcium: 9.3 MG/DL (ref 8.5–10.1)
Chloride: 98 MMOL/L (ref 97–108)
Creatinine: 1.38 MG/DL — ABNORMAL HIGH (ref 0.45–1.15)
GFR est AA: >60 mL/min/{1.73_m2} (ref >60)
GFR est non-AA: 51 mL/min/{1.73_m2} — ABNORMAL LOW (ref >60)
Glucose: 196 MG/DL — ABNORMAL HIGH (ref 65–100)
Potassium: 4.4 MMOL/L (ref 3.5–5.1)
Sodium: 131 MMOL/L — ABNORMAL LOW (ref 136–145)

## 2011-05-23 LAB — MAGNESIUM: Magnesium: 1.6 MG/DL (ref 1.6–2.4)

## 2011-05-23 LAB — PHOSPHORUS: Phosphorus: 5 MG/DL — ABNORMAL HIGH (ref 2.5–4.9)

## 2011-05-23 MED ORDER — AMLODIPINE 5 MG TAB
5 mg | Freq: Every day | ORAL | Status: DC
Start: 2011-05-23 — End: 2011-05-25
  Administered 2011-05-23 – 2011-05-25 (×3): via ORAL

## 2011-05-23 MED ORDER — INSULIN LISPRO 100 UNIT/ML INJECTION
100 unit/mL | Freq: Three times a day (TID) | SUBCUTANEOUS | Status: DC
Start: 2011-05-23 — End: 2011-05-25
  Administered 2011-05-23 – 2011-05-25 (×3): via SUBCUTANEOUS

## 2011-05-23 MED ORDER — PNEUMOCOCCAL 23-VALPS VACCINE 25 MCG/0.5 ML INJECTION
25 mcg/0.5 mL | INTRAMUSCULAR | Status: AC
Start: 2011-05-23 — End: 2011-05-23
  Administered 2011-05-24: via INTRAMUSCULAR

## 2011-05-23 NOTE — Progress Notes (Signed)
[place] [facility]  [address] [place], [place] [address]  [phone]      Medical Progress Note      NAME: Travis [name]   DOB:  04-14-1941  MRM:  [id number]    Date/Time: 05/23/2011 8:58 AM       Assessment and Plan:     NSTEMI (non-ST elevated myocardial infarction) / CAD (coronary artery disease) - Appreciate cardiology assistance.  Cath 12/24 showed CAD that cannot be addressed by stents.  Continue ASA and Plavix. Cath in 1999 showed distal disease in the apical LAD and right coronary artery with a normal ejection fraction. Non q MI in 7/06 and cath showed mod LAD abn RCA disease up to 50-60% , small occluded LCX with normal LVEF.   Continue ASA, Plavix, BB, prn NTG    Ischemic toe ulcer - Left first toe.  Appreciate vascular input and their concern for osteo, however 3 phase scan was negative.  Patient has stated his desire for a second opinion, which has been done.  Slow healing ulcer likely due to DM/PAD.  Awaiting vascular study in AM.    DM with renal complications / DKA, type 2, not at goal - Appreciate endocrine help.  BG bottomed out 12/25, better this AM.  Continue DM diet, and scheduled Lantus and Lispro, and will add SSI per low dose protocol.    Acute renal failure (ARF) / Hyponatremia / Chronic kidney disease, stage III (moderate) - Appreciate nephrology assistance. Probably near baseline with Cr 1.4.  Follow with gentle hydration.    Anemia - Stable.  From chronic disease.  No active bleeding.    PAD (peripheral artery disease) -  PVR tracing 05/18/2011 suggest bilateral SFA occlusion and calf vessel disease and high grade stenosis of his proximal fem-pop Angio March 2012 (RVC): r sfa-ak pop with prox stenosis, 1 vs r/o; left diffuse sfa , severe tpt disease    HTN (hypertension) - Continue metoprolol, catapress, terazosin, and cardiology just added Norvasc.    Unspecified hypothyroidism - Continue synthroid.    GERD - Continue combination PPI, H2B, mioprostol.     Hyperlipidemia - Continue Zocor          Subjective:     Chief Complaint:  Feels well, awaiting vascular study.  Appreciate opinion from second vascular MD.    ROS:  (bold if positive, if negative)      Tolerating PT  Tolerating Diet        Objective:     Last 24hrs VS reviewed since prior progress note. Most recent are:    Visit Vitals   Item Reading   ??? BP 170/75   ??? Pulse 91   ??? Temp 98.6 ??F (37 ??C)   ??? Resp 18   ??? Ht 5\' 6"  (1.676 m)   ??? Wt 189 lb 8 oz   ??? BMI 30.59 kg/m2   ??? SpO2 98%     SpO2 Readings from Last 6 Encounters:   05/23/11 98%   11/13/09 98%   11/13/09 98%   11/13/09 98%   10/17/09 100%   10/15/09 98%    O2 Flow Rate (L/min): 2 l/min       Intake/Output Summary (Last 24 hours) at 05/23/11 0858  Last data filed at 05/23/11 0809   Gross per 24 hour   Intake      0 ml   Output    450 ml   Net   -450 ml  Physical Exam:    Gen:  Well-developed, well-nourished, in no acute distress  HEENT:  Pink conjunctivae, PERRL, hearing intact to voice, moist mucous membranes  Neck:  Supple, without masses, thyroid non-tender  Resp:  No accessory muscle use, clear breath sounds without wheezes rales or rhonchi  Card:  No murmurs, normal S1, S2 without thrills, bruits or peripheral edema  Abd:  Soft, non-tender, non-distended, normoactive bowel sounds are present, no mass  Lymph:  No cervical or inguinal adenopathy  Musc:  No cyanosis or clubbing  Skin:  L great toe ulcers, skin turgor is good  Neuro:  Cranial nerves are grossly intact, mild motor weakness, follows commands appropriately  Psych:  Moderate insight, oriented to person, place and time, alert    Telemetry reviewed:   normal sinus rhythm  __________________________________________________________________  Medications Reviewed: (see below)  Medications:     Current Facility-Administered Medications   Medication Dose Route Frequency   ??? amLODIPine (NORVASC) tablet 5 mg  5 mg Oral DAILY   ??? metoprolol (LOPRESSOR) tablet 50 mg  50 mg Oral BID    ??? lisinopril (PRINIVIL, ZESTRIL) tablet 20 mg  20 mg Oral DAILY   ??? insulin lispro (HUMALOG) injection 6 Units  6 Units SubCUTAneous TIDAC   ??? insulin lispro (HUMALOG) injection   SubCUTAneous AC&HS   ??? acetaminophen (TYLENOL) tablet 650 mg  650 mg Oral Q6H PRN   ??? acetylcysteine (MUCOMYST) 200 mg/mL (20 %) solution 600 mg  600 mg Oral Q12H   ??? 0.9% sodium chloride infusion  75 mL/hr IntraVENous CONTINUOUS   ??? cloNIDine (CATAPRES) tablet 0.1 mg  0.1 mg Oral Q6H PRN   ??? silver sulfADIAZINE (SILVADENE) 1 % topical cream   Topical BID   ??? insulin glargine (LANTUS) injection 20 Units  20 Units SubCUTAneous ACB   ??? sodium hypochlorite (1/4 STRENGTH DAKINS) irrigation (bottle)   Topical DAILY   ??? pantothenic ac-min oil-pet,hyd (AQUAPHOR) 41 % ointment   Topical TID   ??? aspirin chewable tablet 81 mg  81 mg Oral DAILY   ??? levothyroxine (SYNTHROID) tablet 150 mcg  150 mcg Oral ACB   ??? misoprostol (CYTOTEC) tablet 200 mcg  200 mcg Oral BID WITH MEALS   ??? simvastatin (ZOCOR) tablet 20 mg  20 mg Oral QHS   ??? terazosin (HYTRIN) capsule 10 mg  10 mg Oral QHS   ??? SALINE PERIPHERAL FLUSH Q8H Soln 5 mL  5 mL InterCATHeter Q8H   ??? saline peripheral flush Soln 5 mL  5 mL InterCATHeter PRN   ??? oxyCODONE-acetaminophen (PERCOCET) 5-325 mg per tablet 1 Tab  1 Tab Oral Q6H PRN   ??? morphine injection 2 mg  2 mg IntraVENous Q4H PRN   ??? naloxone (NARCAN) injection 0.4 mg  0.4 mg IntraVENous PRN   ??? diphenhydrAMINE (BENADRYL) capsule 25 mg  25 mg Oral Q4H PRN   ??? ondansetron (ZOFRAN) injection 4 mg  4 mg IntraVENous Q4H PRN   ??? magnesium hydroxide (MILK OF MAGNESIA) oral suspension 30 mL  30 mL Oral DAILY PRN   ??? docusate sodium (COLACE) capsule 100 mg  100 mg Oral BID   ??? bisacodyl (DULCOLAX) suppository 10 mg  10 mg Rectal DAILY PRN   ??? glucose chewable tablet 16 g  4 Tab Oral PRN   ??? dextrose (D50W) injection Syrg 12.5-25 g  12.5-25 g IntraVENous PRN   ??? glucagon (GLUCAGEN) injection 1 mg  1 mg IntraMUSCular PRN    ??? pantoprazole (PROTONIX) tablet 40  mg  40 mg Oral ACB   ??? promethazine (PHENERGAN) 12.5 mg in NS 50 ml IVPB  12.5 mg IntraVENous Q8H PRN   ??? famotidine (PEPCID) tablet 20 mg  20 mg Oral ACB&D        Lab Data Reviewed: (see below)  Lab Review:     Recent Labs   [facility] 05/22/11 0505 05/21/11 0455    WBC 5.9 5.6    HGB 9.5* 9.2*    HCT 28.8* 27.7*    PLT 133* 127*     Recent Labs   [facility] 05/23/11 0420 05/22/11 0505    NA 131* 132*    K 4.4 4.4    CL 98 99    CO2 27 27    GLU 196* 234*    BUN 23* 21*    CREA 1.38* 1.44*    CA 9.3 8.7    MG 1.6 --    PHOS 5.0* 4.6    ALB -- 2.8*    TBIL -- --    SGOT -- --    INR -- --     Lab Results   Component Value Date/Time    POC GLUCOSE 309 05/23/2011  8:24 AM    POC GLUCOSE 286 05/22/2011  8:56 PM    POC GLUCOSE 156 05/22/2011  4:28 PM    POC GLUCOSE 401 05/22/2011 11:40 AM    POC GLUCOSE 304 05/22/2011  7:21 AM       Other pertinent lab: none    Total time spent with patient: 69 Minutes                  Care Plan discussed with: Patient, Nursing Staff, Consultant/Specialist and >50% of time spent in counseling and coordination of care    Discussed:  Care Plan    Prophylaxis:  H2B/PPI    Disposition:  Home w/Family           ___________________________________________________    Attending Physician: Salvadore Dom, MD

## 2011-05-23 NOTE — Progress Notes (Signed)
Vascular Surgery IP progress note    --(covering for Dr. Sheliah Hatch)  --pt requesting our involvement for left leg ischemia  --today is complaining of left foot pain    Visit Vitals   Item Reading   ??? BP 165/69   ??? Pulse 111   ??? Temp 99 ??F (37.2 ??C)   ??? Resp 20   ??? Ht 5\' 6"  (1.676 m)   ??? Wt 85.957 kg (189 lb 8 oz)   ??? BMI 30.59 kg/m2   ??? SpO2 95%       --No changes in exam; left toe wound dressed        Recent Results (from the past 24 hour(s))   GLUCOSE, POC    Collection Time    05/22/11  4:28 PM       Component Value Range    POC GLUCOSE 156 (*) 75 - 110 mg/dL    Performed by Delfina Redwood     GLUCOSE, POC    Collection Time    05/22/11  8:56 PM       Component Value Range    POC GLUCOSE 286 (*) 75 - 110 mg/dL    Performed by Tana Coast     MAGNESIUM    Collection Time    05/23/11  4:20 AM       Component Value Range    Magnesium 1.6  1.6 - 2.4 MG/DL   METABOLIC PANEL, BASIC    Collection Time    05/23/11  4:20 AM       Component Value Range    Sodium 131 (*) 136 - 145 MMOL/L    Potassium 4.4  3.5 - 5.1 MMOL/L    Chloride 98  97 - 108 MMOL/L    CO2 27  21 - 32 MMOL/L    Anion gap 6  5 - 15 mmol/L    Glucose 196 (*) 65 - 100 MG/DL    BUN 23 (*) 6 - 20 MG/DL    Creatinine 8.46 (*) 0.45 - 1.15 MG/DL    BUN/Creatinine ratio 17  12 - 20      GFR est AA >60  >60 ml/min/1.23m2    GFR est non-AA 51 (*) >60 ml/min/1.65m2    Calcium 9.3  8.5 - 10.1 MG/DL   PHOSPHORUS    Collection Time    05/23/11  4:20 AM       Component Value Range    Phosphorus 5.0 (*) 2.5 - 4.9 MG/DL   GLUCOSE, POC    Collection Time    05/23/11  8:24 AM       Component Value Range    POC GLUCOSE 309 (*) 75 - 110 mg/dL    Performed by PEELE KELLY     GLUCOSE, POC    Collection Time    05/23/11 11:32 AM       Component Value Range    POC GLUCOSE 441 (*) 75 - 110 mg/dL    Performed by Marlana Latus         --will plan for LLE arteriogram tomorrow  --npo p mn  --mucomyst/bicarb

## 2011-05-23 NOTE — Progress Notes (Signed)
NUTRITION    BEST PRACTICE ALERT:   Braden Score: 21      Nutrition: Adequate  Patient Vitals for the past 100 hrs:   % Diet Eaten   05/23/11 0926 75 %   05/21/11 1225 100 %     Pt screened for nutrition risk d/t los. He reports a good appetite. States "they won't give me bacon". RD explained that he is on a Cardiac diet and pt was understanding.     RD PLAN:   No nutrition risk identified at this time.  Patient likely to meet nutrition needs without additional interventions.  Will see again for nutrition rescreen as indicated.      Curt Jews. Orlene Erm, RD, CNSC

## 2011-05-23 NOTE — Progress Notes (Signed)
Dr. Marcy Panning called and will sched aortagram for Friday per patients agreement. Go ahead and give mucomyst today and let him eat. Orders to follow.

## 2011-05-23 NOTE — Progress Notes (Signed)
Bedside and Verbal shift change report given to Roselle Locus, RN (oncoming nurse) by Ladon Applebaum, RN (offgoing nurse).  Report given with SBAR, Kardex and MAR.

## 2011-05-23 NOTE — Progress Notes (Signed)
[facility] Progress Note   [facility]   [address] [place], [place] [address]   [phone]      Assessment & Plan:   1.  BJY:NWGNFA yesterday.  ?? Non oliguric  ?? cr stable today  ?? Cr near baseline yesterday  ?? Labs in am  ?? IVF + Mucomyst prior to angio  ?? CIN dw pt  ?? Monitor mild hyponatremia  2.  CKD 3  3. HTN  4. CAD:s/p cath:diffuse CAD  5. NSTEMI  6. DM  7. Anemia       Subjective:   CHIEF COMPLAIN: f/up for ARF,CKD,Hyponatremia  HPI: Patient seen.  Plan for  Angio  Some foot pain  Patient denies any Chest pain/palpitations.    Patient denies any shortness of breath.  Pt denies any nausea,vomiting,diarrhea.          Current Facility-Administered Medications   Medication Dose Route Frequency   ??? amLODIPine (NORVASC) tablet 5 mg  5 mg Oral DAILY   ??? pneumococcal 23-valent (PNEUMOVAX 23) injection 0.5 mL  0.5 mL IntraMUSCular PRIOR TO DISCHARGE   ??? metoprolol (LOPRESSOR) tablet 50 mg  50 mg Oral BID   ??? lisinopril (PRINIVIL, ZESTRIL) tablet 20 mg  20 mg Oral DAILY   ??? insulin lispro (HUMALOG) injection 6 Units  6 Units SubCUTAneous TIDAC   ??? insulin lispro (HUMALOG) injection   SubCUTAneous AC&HS   ??? acetaminophen (TYLENOL) tablet 650 mg  650 mg Oral Q6H PRN   ??? acetylcysteine (MUCOMYST) 200 mg/mL (20 %) solution 600 mg  600 mg Oral Q12H   ??? 0.9% sodium chloride infusion  75 mL/hr IntraVENous CONTINUOUS   ??? cloNIDine (CATAPRES) tablet 0.1 mg  0.1 mg Oral Q6H PRN   ??? silver sulfADIAZINE (SILVADENE) 1 % topical cream   Topical BID   ??? insulin glargine (LANTUS) injection 20 Units  20 Units SubCUTAneous ACB   ??? sodium hypochlorite (1/4 STRENGTH DAKINS) irrigation (bottle)   Topical DAILY   ??? pantothenic ac-min oil-pet,hyd (AQUAPHOR) 41 % ointment   Topical TID   ??? aspirin chewable tablet 81 mg  81 mg Oral DAILY   ??? levothyroxine (SYNTHROID) tablet 150 mcg  150 mcg Oral ACB   ??? misoprostol (CYTOTEC) tablet 200 mcg  200 mcg Oral BID WITH MEALS    ??? simvastatin (ZOCOR) tablet 20 mg  20 mg Oral QHS   ??? terazosin (HYTRIN) capsule 10 mg  10 mg Oral QHS   ??? SALINE PERIPHERAL FLUSH Q8H Soln 5 mL  5 mL InterCATHeter Q8H   ??? saline peripheral flush Soln 5 mL  5 mL InterCATHeter PRN   ??? oxyCODONE-acetaminophen (PERCOCET) 5-325 mg per tablet 1 Tab  1 Tab Oral Q6H PRN   ??? morphine injection 2 mg  2 mg IntraVENous Q4H PRN   ??? naloxone (NARCAN) injection 0.4 mg  0.4 mg IntraVENous PRN   ??? diphenhydrAMINE (BENADRYL) capsule 25 mg  25 mg Oral Q4H PRN   ??? ondansetron (ZOFRAN) injection 4 mg  4 mg IntraVENous Q4H PRN   ??? magnesium hydroxide (MILK OF MAGNESIA) oral suspension 30 mL  30 mL Oral DAILY PRN   ??? docusate sodium (COLACE) capsule 100 mg  100 mg Oral BID   ??? bisacodyl (DULCOLAX) suppository 10 mg  10 mg Rectal DAILY PRN   ??? glucose chewable tablet 16 g  4 Tab Oral PRN   ??? dextrose (D50W) injection Syrg 12.5-25 g  12.5-25 g IntraVENous PRN   ???  glucagon (GLUCAGEN) injection 1 mg  1 mg IntraMUSCular PRN   ??? pantoprazole (PROTONIX) tablet 40 mg  40 mg Oral ACB   ??? promethazine (PHENERGAN) 12.5 mg in NS 50 ml IVPB  12.5 mg IntraVENous Q8H PRN   ??? famotidine (PEPCID) tablet 20 mg  20 mg Oral ACB&D        Review of Systems:  Pertinent items are noted in HPI.    Objective:     Vitals:  Blood pressure 165/69, pulse 111, temperature 99 ??F (37.2 ??C), resp. rate 20, height 5\' 6"  (1.676 m), weight 85.957 kg (189 lb 8 oz), SpO2 95.00%.  Temp (24hrs), Avg:98.5 ??F (36.9 ??C), Min:97.8 ??F (36.6 ??C), Max:99 ??F (37.2 ??C)      Intake and Output:  12/27 0700 - 12/27 1859  In: 200 [P.O.:200]  Out: 300 [Urine:300]  12/25 1900 - 12/27 0659  In: 960 [P.O.:960]  Out: 1425 [Urine:1425]    Physical Exam:                Patient is intubated:  no    Physical Examination:   GENERAL ASSESSMENT: NAD  HEENT:Nontraumatic   CHEST: CTA  HEART: S1S2  ABDOMEN: Soft,NT,  ZO:XWRUE: none  EXTREMITY: EDEMA  NEURO:Grossly non focal          ECG/rhythm:  nsr  Data Review       No results found for this basename: ITNL in the last 72 hours   No results found for this basename: CPK:3,CKMB:3,TROIQ:3, in the last 72 hours  Recent Labs   Basename 05/23/11 0420 05/22/11 0505 05/21/11 0455    NA 131* 132* --    K 4.4 4.4 --    CL 98 99 --    CO2 27 27 --    BUN 23* 21* --    CREA 1.38* 1.44* --    GLU 196* 234* --    PHOS 5.0* 4.6 --    MG 1.6 -- --    ALB -- 2.8* --    WBC -- 5.9 5.6    HGB -- 9.5* 9.2*    HCT -- 28.8* 27.7*    PLT -- 133* 127*     No results found for this basename: INR:3,PTP:3,APTT:3, in the last 72 hours  Needs: urine analysis, urine sodium, protein and creatinine  Lab Results   Component Value Date/Time    Sodium,urine random 59 05/21/2011  9:00 PM    Creatinine,urine random 32.49 05/21/2011  9:00 PM         Discussed with:  Nursing, pt          [facility]  [address]  [place], [place] [address]  Phone - [phone]  Fax - [phone]

## 2011-05-23 NOTE — Progress Notes (Signed)
Bedside and Verbal shift change report given to Dee Toney RN (oncoming nurse) by Whitney Hazzard RN (offgoing nurse).  Report given with SBAR, Kardex and Recent Results.

## 2011-05-23 NOTE — Progress Notes (Signed)
Consent obtained for arteriogram w/ possible stents.

## 2011-05-23 NOTE — Progress Notes (Addendum)
Cardiology Progress Note         NAME:  Travis [name]   DOB:   [birth year]   MRN:   [id number]     Assessment/Plan:   NSTEMI/CAD: Occluded RCA and LCx. Pursuing med mgmt. Continue ASA, statin. Plan to start plavix after PVD issue is resolved.  Cont BB bid, ACE- I.  Will restart norvasc for further BP control.   PVD: needs L leg arteriogram.  Creat 1.38 today. Planned for tomorrow.  Mucomyst.     CARDIOLOGY ATTENDING  Patient seen and examined.  Agree with above.  Tharon Aquas, MD, [facility]            Subjective:   Cardiac ROS: Patient denies any exertional chest pain, dyspnea, palpitations, syncope, orthopnea, edema or paroxysmal nocturnal dyspnea.      Review of Systems: Had second opinion for vascular surgery who cont to recommend LLE arteriogram.  Up most of the night per pt with pain L foot.  No nausea, indigestion, vomiting, pain, cough, sputum. No bleeding. Taking po. Appetite good.           Objective:     BP 170/75   Pulse 91   Temp 98.6 ??F (37 ??C)   Resp 18   Ht 5\' 6"  (1.676 m)   Wt 85.957 kg (189 lb 8 oz)   BMI 30.59 kg/m2   SpO2 98% O2 Flow Rate (L/min): 2 l/min O2 Device: Room air    Temp (24hrs), Avg:98.4 ??F (36.9 ??C), Min:97.8 ??F (36.6 ??C), Max:98.7 ??F (37.1 ??C)      12/27 0700 - 12/27 1859  In: -   Out: 300 [Urine:300]    12/25 1900 - 12/27 0659  In: 960 [P.O.:960]  Out: 1425 [Urine:1425]  TELE: SR / V paced     General: AAOx3 cooperative, no acute distress.  HEENT: Atraumatic. Pink and moist.  Anicteric sclerae.  Neck : Supple, no thyromegaly.   Lungs: CTA bilaterally. No wheezing/rhonchi/rales.  Heart: Regular rhythm, no murmur, no rubs, no gallops. No JVD. No carotid bruits.   Abdomen: Soft, non-distended, non-tender. + Bowel sounds. No bruits.  Extremities: L great toe with dressing.  Legs shiny, taught, + 1- LE edema. no clubbing, no cyanosis. No calf tenderness  Neurologic: Grossly intact.  Alert and oriented X 3.  No acute neurological distress.    Psych: Fair insight. Not anxious or agitated.        Care Plan discussed with:    Comments   Patient y    Family      RN y    Futures trader                    Consultant:  y Dr Eulah Pont       Data Review:     No results found for this basename: ITNL in the last 72 hours   No results found for this basename: CPK:3,CKMB:3,TROIQ:3, in the last 72 hours  Recent Labs   [facility] 05/23/11 0420 05/22/11 0505 05/21/11 0455 05/20/11 0820    NA 131* 132* -- 135*    K 4.4 4.4 -- 4.7    CL 98 99 -- 103    CO2 27 27 -- 26    BUN 23* 21* -- 25*    CREA 1.38* 1.44* -- 1.34*    GLU 196* 234* -- 309*    PHOS 5.0* 4.6 -- 2.9    MG 1.6 -- -- 1.8    ALB --  2.8* -- 2.7*    WBC -- 5.9 5.6 5.0    HGB -- 9.5* 9.2* 9.5*    HCT -- 28.8* 27.7* 27.9*    PLT -- 133* 127* 124*     Recent Labs   Basename 05/20/11 0820    INR 1.1    PTP 11.7    APTT --       Medications reviewed  Current Facility-Administered Medications   Medication Dose Route Frequency   ??? metoprolol (LOPRESSOR) tablet 50 mg  50 mg Oral BID   ??? lisinopril (PRINIVIL, ZESTRIL) tablet 20 mg  20 mg Oral DAILY   ??? insulin lispro (HUMALOG) injection 6 Units  6 Units SubCUTAneous TIDAC   ??? insulin lispro (HUMALOG) injection   SubCUTAneous AC&HS   ??? acetaminophen (TYLENOL) tablet 650 mg  650 mg Oral Q6H PRN   ??? acetylcysteine (MUCOMYST) 200 mg/mL (20 %) solution 600 mg  600 mg Oral Q12H   ??? 0.9% sodium chloride infusion  75 mL/hr IntraVENous CONTINUOUS   ??? cloNIDine (CATAPRES) tablet 0.1 mg  0.1 mg Oral Q6H PRN   ??? silver sulfADIAZINE (SILVADENE) 1 % topical cream   Topical BID   ??? insulin glargine (LANTUS) injection 20 Units  20 Units SubCUTAneous ACB   ??? sodium hypochlorite (1/4 STRENGTH DAKINS) irrigation (bottle)   Topical DAILY   ??? pantothenic ac-min oil-pet,hyd (AQUAPHOR) 41 % ointment   Topical TID   ??? aspirin chewable tablet 81 mg  81 mg Oral DAILY   ??? levothyroxine (SYNTHROID) tablet 150 mcg  150 mcg Oral ACB   ??? misoprostol (CYTOTEC) tablet 200 mcg  200 mcg Oral BID WITH MEALS    ??? simvastatin (ZOCOR) tablet 20 mg  20 mg Oral QHS   ??? terazosin (HYTRIN) capsule 10 mg  10 mg Oral QHS   ??? SALINE PERIPHERAL FLUSH Q8H Soln 5 mL  5 mL InterCATHeter Q8H   ??? saline peripheral flush Soln 5 mL  5 mL InterCATHeter PRN   ??? oxyCODONE-acetaminophen (PERCOCET) 5-325 mg per tablet 1 Tab  1 Tab Oral Q6H PRN   ??? morphine injection 2 mg  2 mg IntraVENous Q4H PRN   ??? naloxone (NARCAN) injection 0.4 mg  0.4 mg IntraVENous PRN   ??? diphenhydrAMINE (BENADRYL) capsule 25 mg  25 mg Oral Q4H PRN   ??? ondansetron (ZOFRAN) injection 4 mg  4 mg IntraVENous Q4H PRN   ??? magnesium hydroxide (MILK OF MAGNESIA) oral suspension 30 mL  30 mL Oral DAILY PRN   ??? docusate sodium (COLACE) capsule 100 mg  100 mg Oral BID   ??? bisacodyl (DULCOLAX) suppository 10 mg  10 mg Rectal DAILY PRN   ??? glucose chewable tablet 16 g  4 Tab Oral PRN   ??? dextrose (D50W) injection Syrg 12.5-25 g  12.5-25 g IntraVENous PRN   ??? glucagon (GLUCAGEN) injection 1 mg  1 mg IntraMUSCular PRN   ??? pantoprazole (PROTONIX) tablet 40 mg  40 mg Oral ACB   ??? promethazine (PHENERGAN) 12.5 mg in NS 50 ml IVPB  12.5 mg IntraVENous Q8H PRN   ??? famotidine (PEPCID) tablet 20 mg  20 mg Oral ACB&D         Stephanie A. Vilma Prader, NP

## 2011-05-24 LAB — GLUCOSE, POC
Glucose (POC): 173 mg/dL — ABNORMAL HIGH (ref 75–110)
Glucose (POC): 386 mg/dL — ABNORMAL HIGH (ref 75–110)
Glucose (POC): 277 mg/dL — ABNORMAL HIGH (ref 75–110)

## 2011-05-24 LAB — METABOLIC PANEL, BASIC
Anion gap: 7 mmol/L (ref 5–15)
BUN/Creatinine ratio: 19 (ref 12–20)
BUN: 24 MG/DL — ABNORMAL HIGH (ref 6–20)
CO2: 27 MMOL/L (ref 21–32)
Calcium: 8.9 MG/DL (ref 8.5–10.1)
Chloride: 99 MMOL/L (ref 97–108)
Creatinine: 1.28 MG/DL — ABNORMAL HIGH (ref 0.45–1.15)
GFR est AA: >60 mL/min/{1.73_m2} (ref >60)
GFR est non-AA: 55 mL/min/{1.73_m2} — ABNORMAL LOW (ref >60)
Glucose: 126 MG/DL — ABNORMAL HIGH (ref 65–100)
Potassium: 4.2 MMOL/L (ref 3.5–5.1)
Sodium: 133 MMOL/L — ABNORMAL LOW (ref 136–145)

## 2011-05-24 LAB — CBC WITH AUTOMATED DIFF
ABS. BASOPHILS: 0 10*3/uL (ref 0.0–0.1)
ABS. EOSINOPHILS: 0.1 10*3/uL (ref 0.0–0.4)
ABS. LYMPHOCYTES: 1.4 10*3/uL (ref 0.8–3.5)
ABS. MONOCYTES: 0.6 10*3/uL (ref 0.0–1.0)
ABS. NEUTROPHILS: 3.3 10*3/uL (ref 1.8–8.0)
BASOPHILS: 0 % (ref 0–1)
EOSINOPHILS: 3 % (ref 0–7)
HCT: 27.6 % — ABNORMAL LOW (ref 36.6–50.3)
HGB: 9.3 g/dL — ABNORMAL LOW (ref 12.1–17.0)
LYMPHOCYTES: 26 % (ref 12–49)
MCH: 29.3 PG (ref 26.0–34.0)
MCHC: 33.7 g/dL (ref 30.0–36.5)
MCV: 87.1 FL (ref 80.0–99.0)
MONOCYTES: 11 % (ref 5–13)
NEUTROPHILS: 60 % (ref 32–75)
PLATELET: 170 10*3/uL (ref 150–400)
RBC: 3.17 M/uL — ABNORMAL LOW (ref 4.10–5.70)
RDW: 15 % — ABNORMAL HIGH (ref 11.5–14.5)
WBC: 5.5 10*3/uL (ref 4.1–11.1)

## 2011-05-24 LAB — MAGNESIUM: Magnesium: 1.5 MG/DL — ABNORMAL LOW (ref 1.6–2.4)

## 2011-05-24 MED ORDER — SODIUM BICARBONATE 8.4 % IV
18.4 mEq/mL (8.4 %) | INTRAVENOUS | Status: DC
Start: 2011-05-24 — End: 2011-05-24
  Administered 2011-05-24 (×3): via INTRAVENOUS

## 2011-05-24 MED ORDER — FENTANYL CITRATE (PF) 50 MCG/ML IJ SOLN
50 mcg/mL | INTRAMUSCULAR | Status: DC | PRN
Start: 2011-05-24 — End: 2011-05-25
  Administered 2011-05-24 (×7): via INTRAVENOUS

## 2011-05-24 MED ORDER — HEPARIN (PORCINE) IN NS (PF) 1,000 UNIT/500 ML IV
1000 unit/500 mL | Freq: Once | INTRAVENOUS | Status: AC
Start: 2011-05-24 — End: 2011-05-24
  Administered 2011-05-24: 12:00:00

## 2011-05-24 MED ORDER — HEPARIN (PORCINE) 1,000 UNIT/ML IJ SOLN
1000 unit/mL | Freq: Once | INTRAMUSCULAR | Status: AC
Start: 2011-05-24 — End: 2011-05-24
  Administered 2011-05-24: 13:00:00 via INTRAVENOUS

## 2011-05-24 MED ORDER — CLOPIDOGREL 75 MG TAB
75 mg | Freq: Every day | ORAL | Status: DC
Start: 2011-05-24 — End: 2011-05-25
  Administered 2011-05-24 – 2011-05-25 (×3): via ORAL

## 2011-05-24 MED ORDER — PANTOTHENIC AC-MIN OIL-PET,HYD OINTMENT
41 % | Freq: Three times a day (TID) | CUTANEOUS | Status: DC
Start: 2011-05-24 — End: 2011-05-25
  Administered 2011-05-24 – 2011-05-25 (×4): via TOPICAL

## 2011-05-24 MED ORDER — MIDAZOLAM 1 MG/ML IJ SOLN
1 mg/mL | INTRAMUSCULAR | Status: DC | PRN
Start: 2011-05-24 — End: 2011-05-25
  Administered 2011-05-24 (×6): via INTRAVENOUS

## 2011-05-24 MED ORDER — DEXTROSE 5% IN WATER (D5W) IV
1 mEq/mL (8.4 %) | INTRAVENOUS | Status: AC
Start: 2011-05-24 — End: 2011-05-24
  Administered 2011-05-24: 15:00:00 via INTRAVENOUS

## 2011-05-24 MED ORDER — LIDOCAINE HCL 1 % (10 MG/ML) IJ SOLN
10 mg/mL (1 %) | Freq: Once | INTRAMUSCULAR | Status: AC
Start: 2011-05-24 — End: 2011-05-24
  Administered 2011-05-24: 13:00:00 via INTRADERMAL

## 2011-05-24 MED ORDER — PANTOTHENIC AC-MIN OIL-PET,HYD OINTMENT
41 % | Freq: Three times a day (TID) | CUTANEOUS | Status: DC
Start: 2011-05-24 — End: 2011-05-24

## 2011-05-24 MED ORDER — HEPARIN (PORCINE) 5,000 UNIT/ML IJ SOLN
5000 unit/mL | Freq: Once | INTRAMUSCULAR | Status: DC
Start: 2011-05-24 — End: 2011-05-24
  Administered 2011-05-24: 13:00:00 via INTRAVENOUS

## 2011-05-24 MED ORDER — SILVER SULFADIAZINE 1 % TOPICAL CREAM
1 % | Freq: Every day | CUTANEOUS | Status: DC
Start: 2011-05-24 — End: 2011-05-25
  Administered 2011-05-25: 14:00:00 via TOPICAL

## 2011-05-24 MED ORDER — IODIXANOL 320 MG/ML IV SOLN
320 mg iodine/mL | Freq: Once | INTRAVENOUS | Status: AC
Start: 2011-05-24 — End: 2011-05-24
  Administered 2011-05-24: 14:00:00

## 2011-05-24 MED ORDER — IOVERSOL 350 MG/ML IV SOLN
350 mg iodine/mL | INTRAVENOUS | Status: DC | PRN
Start: 2011-05-24 — End: 2011-05-25
  Administered 2011-05-24: 13:00:00

## 2011-05-24 NOTE — Progress Notes (Signed)
[facility] Progress Note  [facility]   [address] [place], [place] [address]   [phone]      Assessment & Plan:   1.  OZH:YQMVHQ yesterday.  ?? Non oliguric  ?? cr stable Labs in am as he got angio today  ?? CIN dw pt  ?? Monitor mild hyponatremia  2.  CKD 3  3. HTN  4. CAD:s/p cath:diffuse CAD  5. NSTEMI  6. DM  7. Anemia       Subjective:   CHIEF COMPLAIN: f/up for ARF,CKD,Hyponatremia  HPI: Patient  getting Angio            Current Facility-Administered Medications   Medication Dose Route Frequency   ??? heparinized saline 2 units/mL infusion 1,000 Units  500 mL Irrigation ONCE   ??? heparinized saline 2 units/mL infusion 1,000 Units  500 mL Irrigation ONCE   ??? lidocaine (XYLOCAINE) 10 mg/mL (1 %) injection 30 mL  30 mL IntraDERMal ONCE   ??? iodixanol (VISIPAQUE) 320 mg/mL contrast injection 200 mL  200 mL InterCATHeter ONCE   ??? fentaNYL citrate (PF) injection 25-100 mcg  25-100 mcg IntraVENous Multiple   ??? midazolam (VERSED) injection 0.5-4 mg  0.5-4 mg IntraVENous Multiple   ??? heparin (porcine) 1,000 unit/mL injection 5,000 Units  5,000 Units IntraVENous ONCE   ??? ioversol (OPTIRAY) 350 mg iodine/mL contrast solution 10-50 mL  10-50 mL InterCATHeter Multiple   ??? heparin (porcine) 1,000 unit/mL injection 1,000 Units  1,000 Units IntraVENous ONCE   ??? amLODIPine (NORVASC) tablet 5 mg  5 mg Oral DAILY   ??? pneumococcal 23-valent (PNEUMOVAX 23) injection 0.5 mL  0.5 mL IntraMUSCular PRIOR TO DISCHARGE   ??? insulin lispro (HUMALOG) injection 9 Units  9 Units SubCUTAneous TIDAC   ??? sodium bicarbonate (8.4%) 150 mEq in dextrose 5% 1,000 mL infusion  0.5-1.5 mL/kg/hr IntraVENous CONTINUOUS   ??? metoprolol (LOPRESSOR) tablet 50 mg  50 mg Oral BID   ??? lisinopril (PRINIVIL, ZESTRIL) tablet 20 mg  20 mg Oral DAILY   ??? insulin lispro (HUMALOG) injection   SubCUTAneous AC&HS   ??? acetaminophen (TYLENOL) tablet 650 mg  650 mg Oral Q6H PRN    ??? acetylcysteine (MUCOMYST) 200 mg/mL (20 %) solution 600 mg  600 mg Oral Q12H   ??? 0.9% sodium chloride infusion  75 mL/hr IntraVENous CONTINUOUS   ??? cloNIDine (CATAPRES) tablet 0.1 mg  0.1 mg Oral Q6H PRN   ??? silver sulfADIAZINE (SILVADENE) 1 % topical cream   Topical BID   ??? insulin glargine (LANTUS) injection 20 Units  20 Units SubCUTAneous ACB   ??? sodium hypochlorite (1/4 STRENGTH DAKINS) irrigation (bottle)   Topical DAILY   ??? pantothenic ac-min oil-pet,hyd (AQUAPHOR) 41 % ointment   Topical TID   ??? aspirin chewable tablet 81 mg  81 mg Oral DAILY   ??? levothyroxine (SYNTHROID) tablet 150 mcg  150 mcg Oral ACB   ??? misoprostol (CYTOTEC) tablet 200 mcg  200 mcg Oral BID WITH MEALS   ??? simvastatin (ZOCOR) tablet 20 mg  20 mg Oral QHS   ??? terazosin (HYTRIN) capsule 10 mg  10 mg Oral QHS   ??? SALINE PERIPHERAL FLUSH Q8H Soln 5 mL  5 mL InterCATHeter Q8H   ??? saline peripheral flush Soln 5 mL  5 mL InterCATHeter PRN   ??? oxyCODONE-acetaminophen (PERCOCET) 5-325 mg per tablet 1 Tab  1 Tab Oral Q6H PRN   ??? morphine injection 2 mg  2  mg IntraVENous Q4H PRN   ??? naloxone (NARCAN) injection 0.4 mg  0.4 mg IntraVENous PRN   ??? diphenhydrAMINE (BENADRYL) capsule 25 mg  25 mg Oral Q4H PRN   ??? ondansetron (ZOFRAN) injection 4 mg  4 mg IntraVENous Q4H PRN   ??? magnesium hydroxide (MILK OF MAGNESIA) oral suspension 30 mL  30 mL Oral DAILY PRN   ??? docusate sodium (COLACE) capsule 100 mg  100 mg Oral BID   ??? bisacodyl (DULCOLAX) suppository 10 mg  10 mg Rectal DAILY PRN   ??? glucose chewable tablet 16 g  4 Tab Oral PRN   ??? dextrose (D50W) injection Syrg 12.5-25 g  12.5-25 g IntraVENous PRN   ??? glucagon (GLUCAGEN) injection 1 mg  1 mg IntraMUSCular PRN   ??? pantoprazole (PROTONIX) tablet 40 mg  40 mg Oral ACB   ??? promethazine (PHENERGAN) 12.5 mg in NS 50 ml IVPB  12.5 mg IntraVENous Q8H PRN   ??? famotidine (PEPCID) tablet 20 mg  20 mg Oral ACB&D        Review of Systems:  Pertinent items are noted in HPI.    Objective:     Vitals:   Blood pressure 155/71, pulse 114, temperature 98.9 ??F (37.2 ??C), resp. rate 21, height 5\' 6"  (1.676 m), weight 85.957 kg (189 lb 8 oz), SpO2 95.00%.  Temp (24hrs), Avg:98.4 ??F (36.9 ??C), Min:97.2 ??F (36.2 ??C), Max:99 ??F (37.2 ??C)      Intake and Output:     12/26 1900 - 12/28 0659  In: 200 [P.O.:200]  Out: 1250 [Urine:1250]    Physical Exam:                Patient is intubated:  no    Physical Examination:             ECG/rhythm:  nsr  Data Review      No results found for this basename: ITNL in the last 72 hours   No results found for this basename: CPK:3,CKMB:3,TROIQ:3, in the last 72 hours  Recent Labs   Basename 05/24/11 0455 05/23/11 0420 05/22/11 0505    NA 133* 131* 132*    K 4.2 4.4 4.4    CL 99 98 99    CO2 27 27 27     BUN 24* 23* 21*    CREA 1.28* 1.38* 1.44*    GLU 126* 196* 234*    PHOS -- 5.0* 4.6    MG 1.5* 1.6 --    ALB -- -- 2.8*    WBC 5.5 -- 5.9    HGB 9.3* -- 9.5*    HCT 27.6* -- 28.8*    PLT 170 -- 133*     No results found for this basename: INR:3,PTP:3,APTT:3, in the last 72 hours  Needs: urine analysis, urine sodium, protein and creatinine  Lab Results   Component Value Date/Time    Sodium,urine random 59 05/21/2011  9:00 PM    Creatinine,urine random 32.49 05/21/2011  9:00 PM         Discussed with:  Nursing, pt          [facility]  [address]  [place], [place] [address]  Phone - [phone]  Fax - [phone]

## 2011-05-24 NOTE — Progress Notes (Signed)
Notified Dr. Eulah Pont of patient not voiding large amounts of urine and stating " i feel like i have to pee" yet not able to urine. Bladder scan shows of urine. Dr. Eulah Pont notified of this and received TORB orders that "if patient does not void after 2200, straight cath patient once."  Will continue to monitor.

## 2011-05-24 NOTE — Progress Notes (Signed)
TRANSFER - IN REPORT:    Verbal report received from [name](name) on Travis [name]  being received from angio(unit) for routine progression of care      Report consisted of patient???s Situation, Background, Assessment and   Recommendations(SBAR).     Information from the following report(s) Procedure Summary was reviewed with the receiving nurse.    Opportunity for questions and clarification was provided.      Assessment completed upon patient???s arrival to unit and care assumed.

## 2011-05-24 NOTE — Progress Notes (Signed)
TRANSFER - OUT REPORT:    Verbal report given to [employer]name) on DRAKKAR MEDEIROS  being transferred to [facility](unit) for routine progression of care       Report consisted of patient???s Situation, Background, Assessment and   Recommendations(SBAR).     Information from the following report(s) Procedure Summary was reviewed with the receiving nurse.    Opportunity for questions and clarification was provided.

## 2011-05-24 NOTE — Procedures (Signed)
POST PROCEDURE NOTE     Travis [name]  [birth year]  [id number]    Date of Procedure: 05/24/11    Preoperative diagnosis:  LLE ischemia with toe wound  Postoperative diagnosis: same    Procedure: 1. Retrograde puncture of R femoral artery                       2. 3rd order selective catheterization (lower extremity)                       3. Left lower extremity arteriogram                        4. Left superficial femoral artery and popliteal artery angioplasty             5. Right femoral arteriogram                       6. Mynx closure of R femoral arteriotomy                       7. Supervision and interpretation of above                       8. Conscious sedation    Operator:  Dr. Lenn Sink, MD    EBL: min    Findings:   --small iliac vessels with stented R iliac system  --multiple high grade stenoses of left sfa and popliteal arteries  --below knee distal popliteal artery and tp trunk occlusions  --posterior tibial artery runoff ((single vessel)  --sfa and poplteal stenoses angioplastied to 3-5mm with resolution of stenoses    Complications: None    Dr. Lenn Sink, MD  05/24/2011  9:14 AM

## 2011-05-24 NOTE — Progress Notes (Signed)
TRANSFER - IN REPORT:    Verbal report received from   [facility] , RN on Travis [name]  being received from [phone]  for routine progression of care      Report consisted of patient???s Situation, Background, Assessment and   Recommendations(SBAR).     Information from the following report(s) SBAR and MAR was reviewed with the receiving nurse.    Opportunity for questions and clarification was provided.      Assessment completed upon patient???s arrival to unit and care assumed.

## 2011-05-24 NOTE — Progress Notes (Signed)
[facility]  [address] [place], [place] [address]  [id number]      Medical Progress Note      NAME: Travis [name]   DOB:  Oct 10, 1940  MRM:  [id number]    Date/Time: 05/24/2011 11:33 AM       Assessment and Plan:     NSTEMI (non-ST elevated myocardial infarction) / CAD (coronary artery disease) - Appreciate cardiology assistance.  Cath 12/24 showed CAD that cannot be addressed by stents.  Continue ASA, and Plavix has been started. Cath in 1999 showed distal disease in the apical LAD and right coronary artery with a normal ejection fraction. Non q MI in 7/06 and cath showed mod LAD abn RCA disease up to 50-60% , small occluded LCX with normal LVEF.   Continue ASA, Plavix, BB, prn NTG    Ischemic toe ulcer - Left first toe.  Appreciate vascular input and their concern for osteo, however 3 phase scan was negative.  Slow healing ulcer likely due to DM/PAD.  Tolerated vascular study this AM with angioplasty.  Watch overnight for complications, heart issues and renal response.     DM with renal complications / DKA, type 2, not at goal - Appreciate endocrine help.  BG bottomed out 12/25, up and down since.  Continue DM diet, and scheduled Lantus and Lispro, and will add SSI per low dose protocol.    Acute renal failure (ARF) / Hyponatremia / Chronic kidney disease, stage III (moderate) - Appreciate nephrology assistance. Probably near baseline with Cr 1.4.  Follow with gentle hydration.  He had mucomyst pre-treatment prior to procedure today.    Anemia - Stable.  From chronic disease.  No active bleeding.    PAD (peripheral artery disease) - Post procedure.  PVR tracing 05/18/2011 suggest bilateral SFA occlusion and calf vessel disease and high grade stenosis of his proximal fem-pop Angio March 2012 ([facility]): r sfa-ak pop with prox stenosis, 1 vs r/o; left diffuse sfa , severe tpt disease     HTN (hypertension) - Continue metoprolol, catapress, terazosin, and cardiology just added Norvasc.    Unspecified hypothyroidism - Continue synthroid.    GERD - Continue combination PPI, H2B, mioprostol.    Hyperlipidemia - Continue Zocor          Subjective:     Chief Complaint:  Groggy due to residual effects of conscious sedation, tolerated vascular study.     ROS: Vaguely describes mild nausea, mild chest pain  (bold if positive, if negative)    NauseaChest Pain  Tolerating PT  Tolerating Diet        Objective:     Last 24hrs VS reviewed since prior progress note. Most recent are:    Visit Vitals   Item Reading   ??? BP 157/63   ??? Pulse 121   ??? Temp 98.9 ??F (37.2 ??C)   ??? Resp 21   ??? Ht 5\' 6"  (1.676 m)   ??? Wt 189 lb 8 oz   ??? BMI 30.59 kg/m2   ??? SpO2 95%     SpO2 Readings from Last 6 Encounters:   05/24/11 95%   11/13/09 98%   11/13/09 98%   11/13/09 98%   10/17/09 100%   10/15/09 98%    O2 Flow Rate (L/min): 2 l/min       Intake/Output Summary (Last 24 hours) at 05/24/11 1133  Last data filed at 05/24/11 1033   Gross per 24 hour   Intake  0 ml   Output   1050 ml   Net  -1050 ml        Physical Exam:    Gen:  Well-developed, well-nourished, in no acute distress, groggy  HEENT:  Pink conjunctivae, PERRL, hearing intact to voice, moist mucous membranes  Neck:  Supple, without masses, thyroid non-tender  Resp:  No accessory muscle use, clear breath sounds without wheezes rales or rhonchi  Card:  No murmurs, normal S1, S2 without thrills, bruits or peripheral edema  Abd:  Soft, non-tender, non-distended, normoactive bowel sounds are present, no mass  Lymph:  No cervical or inguinal adenopathy  Musc:  No cyanosis or clubbing  Skin:  L great toe ulcers, skin turgor is good  Neuro:  Cranial nerves are grossly intact, mild motor weakness, follows commands vaguely  Psych:  Poor insight, oriented to person, place, groggy    Telemetry reviewed:   normal sinus rhythm   __________________________________________________________________  Medications Reviewed: (see below)  Medications:     Current Facility-Administered Medications   Medication Dose Route Frequency   ??? heparinized saline 2 units/mL infusion 1,000 Units  500 mL Irrigation ONCE   ??? heparinized saline 2 units/mL infusion 1,000 Units  500 mL Irrigation ONCE   ??? lidocaine (XYLOCAINE) 10 mg/mL (1 %) injection 30 mL  30 mL IntraDERMal ONCE   ??? iodixanol (VISIPAQUE) 320 mg/mL contrast injection 200 mL  200 mL InterCATHeter ONCE   ??? fentaNYL citrate (PF) injection 25-100 mcg  25-100 mcg IntraVENous Multiple   ??? midazolam (VERSED) injection 0.5-4 mg  0.5-4 mg IntraVENous Multiple   ??? heparin (porcine) 1,000 unit/mL injection 5,000 Units  5,000 Units IntraVENous ONCE   ??? ioversol (OPTIRAY) 350 mg iodine/mL contrast solution 10-50 mL  10-50 mL InterCATHeter Multiple   ??? heparin (porcine) 1,000 unit/mL injection 1,000 Units  1,000 Units IntraVENous ONCE   ??? clopidogrel (PLAVIX) tablet 75 mg  75 mg Oral DAILY   ??? sodium bicarbonate (8.4%) 150 mEq in dextrose 5% 1,000 mL infusion  0.5-1.5 mL/kg/hr IntraVENous CONTINUOUS   ??? amLODIPine (NORVASC) tablet 5 mg  5 mg Oral DAILY   ??? pneumococcal 23-valent (PNEUMOVAX 23) injection 0.5 mL  0.5 mL IntraMUSCular PRIOR TO DISCHARGE   ??? insulin lispro (HUMALOG) injection 9 Units  9 Units SubCUTAneous TIDAC   ??? metoprolol (LOPRESSOR) tablet 50 mg  50 mg Oral BID   ??? lisinopril (PRINIVIL, ZESTRIL) tablet 20 mg  20 mg Oral DAILY   ??? insulin lispro (HUMALOG) injection   SubCUTAneous AC&HS   ??? acetaminophen (TYLENOL) tablet 650 mg  650 mg Oral Q6H PRN   ??? acetylcysteine (MUCOMYST) 200 mg/mL (20 %) solution 600 mg  600 mg Oral Q12H   ??? 0.9% sodium chloride infusion  75 mL/hr IntraVENous CONTINUOUS   ??? cloNIDine (CATAPRES) tablet 0.1 mg  0.1 mg Oral Q6H PRN   ??? silver sulfADIAZINE (SILVADENE) 1 % topical cream   Topical BID   ??? insulin glargine (LANTUS) injection 20 Units  20 Units SubCUTAneous ACB    ??? sodium hypochlorite (1/4 STRENGTH DAKINS) irrigation (bottle)   Topical DAILY   ??? pantothenic ac-min oil-pet,hyd (AQUAPHOR) 41 % ointment   Topical TID   ??? aspirin chewable tablet 81 mg  81 mg Oral DAILY   ??? levothyroxine (SYNTHROID) tablet 150 mcg  150 mcg Oral ACB   ??? misoprostol (CYTOTEC) tablet 200 mcg  200 mcg Oral BID WITH MEALS   ??? simvastatin (ZOCOR) tablet 20 mg  20 mg Oral  QHS   ??? terazosin (HYTRIN) capsule 10 mg  10 mg Oral QHS   ??? SALINE PERIPHERAL FLUSH Q8H Soln 5 mL  5 mL InterCATHeter Q8H   ??? saline peripheral flush Soln 5 mL  5 mL InterCATHeter PRN   ??? oxyCODONE-acetaminophen (PERCOCET) 5-325 mg per tablet 1 Tab  1 Tab Oral Q6H PRN   ??? morphine injection 2 mg  2 mg IntraVENous Q4H PRN   ??? naloxone (NARCAN) injection 0.4 mg  0.4 mg IntraVENous PRN   ??? diphenhydrAMINE (BENADRYL) capsule 25 mg  25 mg Oral Q4H PRN   ??? ondansetron (ZOFRAN) injection 4 mg  4 mg IntraVENous Q4H PRN   ??? magnesium hydroxide (MILK OF MAGNESIA) oral suspension 30 mL  30 mL Oral DAILY PRN   ??? docusate sodium (COLACE) capsule 100 mg  100 mg Oral BID   ??? bisacodyl (DULCOLAX) suppository 10 mg  10 mg Rectal DAILY PRN   ??? glucose chewable tablet 16 g  4 Tab Oral PRN   ??? dextrose (D50W) injection Syrg 12.5-25 g  12.5-25 g IntraVENous PRN   ??? glucagon (GLUCAGEN) injection 1 mg  1 mg IntraMUSCular PRN   ??? pantoprazole (PROTONIX) tablet 40 mg  40 mg Oral ACB   ??? promethazine (PHENERGAN) 12.5 mg in NS 50 ml IVPB  12.5 mg IntraVENous Q8H PRN   ??? famotidine (PEPCID) tablet 20 mg  20 mg Oral ACB&D        Lab Data Reviewed: (see below)  Lab Review:     Recent Labs   [facility] 05/24/11 0455 05/22/11 0505    WBC 5.5 5.9    HGB 9.3* 9.5*    HCT 27.6* 28.8*    PLT 170 133*     Recent Labs   Basename 05/24/11 0455 05/23/11 0420 05/22/11 0505    NA 133* 131* 132*    K 4.2 4.4 4.4    CL 99 98 99    CO2 27 27 27     GLU 126* 196* 234*    BUN 24* 23* 21*    CREA 1.28* 1.38* 1.44*    CA 8.9 9.3 8.7    MG 1.5* 1.6 --    PHOS -- 5.0* 4.6     ALB -- -- 2.8*    TBIL -- -- --    SGOT -- -- --    INR -- -- --     Lab Results   Component Value Date/Time    POC GLUCOSE 173 05/23/2011  9:14 PM    POC GLUCOSE 133 05/23/2011  4:31 PM    POC GLUCOSE 441 05/23/2011 11:32 AM    POC GLUCOSE 309 05/23/2011  8:24 AM    POC GLUCOSE 286 05/22/2011  8:56 PM       Other pertinent lab: none    Total time spent with patient: 69 Minutes                  Care Plan discussed with: Patient, Family, Nursing Staff and >50% of time spent in counseling and coordination of care    Discussed:  Care Plan    Prophylaxis:  H2B/PPI    Disposition:  Home w/Family           ___________________________________________________    Attending Physician: Salvadore Dom, MD

## 2011-05-24 NOTE — Progress Notes (Signed)
TRANSFER - OUT REPORT:    Verbal report given to DOTTIE,RN  on Travis [name]  being transferred to [facility]  for routine progression of care       Report consisted of patient???s Situation, Background, Assessment and   Recommendations(SBAR).     Information from the following report(s) SBAR, Procedure Summary and MAR was reviewed with the receiving nurse.    Opportunity for questions and clarification was provided.

## 2011-05-24 NOTE — Procedures (Signed)
Name:       Travis [name], Travis [name] By:                                             Date:          05/24/2011  Account #:  [id number]  DOB:        [birth year]                     Room:          [id number]                           PERIPHERAL ANGIOPLASTY REPORT      PREOPERATIVE DIAGNOSIS: Left lower extremity ischemia with toe wound.    POSTOPERATIVE DIAGNOSIS: Left lower extremity ischemia with toe wound.    PROCEDURES PERFORMED:  1. Retrograde puncture of right femoral artery.  2. Third-order selective catheterization.  3. Left lower extremity arteriogram.  4. Left superficial femoral artery and popliteal artery angioplasty.  5. Right femoral artery arteriogram.  6. Mynx closure of right femoral artery.  7. Conscious sedation.    SURGEON: Lenn Sink, MD    ANESTHESIA:  Local with sedation.    DRAINS: None.    COMPLICATIONS: None.    FINDINGS: Patient's native iliac system is very small. In the right side,  it has been stented in the external iliac area. There is a high-grade  stenosis that was seen previously in the right proximal area of the fem-pop  bypass. The left iliac system is small. There are multiple high-grade  stenoses of the left superficial femoral artery and popliteal arteries.  There is a below-the-knee distal popliteal artery occlusion, as well as  tibioperoneal trunk occlusion with reconstruction via the posterior tibial  artery. All the SFA and popliteal stenoses were angioplastied to 3 to 5 mm  with good resolution of the stenosis.    INDICATIONS FOR PROCEDURE: The patient is a pleasant 70 year old male who  presented to [name] with chest pain that was evaluated and treated for  a non-ST elevation MI. During his hospital course, he complained of a  nonhealing toe wound, as well as left leg rest pain. Intervention was  recommended and accepted.    DESCRIPTION OF PROCEDURE: The patient was placed on the angio table and   sedation established. Both groins were prepped and draped in the standard  surgical fashion. A retrograde puncture was made at the right common  femoral artery with a micropuncture system and exchanged for a 5-French  sheath over a Bentson wire. A combination of Omni Flush and Bentson wire  were used to cross the aortic bifurcation to the level of the left femoral  head and common femoral artery. This was done because there were some  previous images of the abdominal aorta and iliac arteries from his cardiac  catheterization performed several days earlier. At this point, using a  power injector and a bolus chase, a left lower extremity arteriogram was  performed. This revealed multiple areas of high-grade stenoses from the  origin of the superficial femoral artery interspace down through the  popliteal artery below the knee. The distal popliteal artery occludes along  with the TP trunk with reconstitution of  what appears to be the posterior  tibial artery. There are numerous geniculate collaterals coming from above  the knee popliteal artery. These appear to be fairly robust, and the  primary outflow to the foot is the posterior tibial artery. There also  happens to be a high-grade cut off of the distal portion of the profunda  femoris artery.    At this point, the patient was infused with heparin. It was decided to  intervene on the inflow lesions above the knee in order to limit contrast  usage and provide the most benefit. The Bentson wire was exchanged for a  stiff angle Glide wire. The short 5-French sheath was exchanged for a 40-cm  long, up and over Terumo sheath. The stenosis were relatively easily  crossed with the stiff angle Glide wire in the distal SFA up to the origin  of the SFA. Tandem lesions were angioplastied with a 5-mm x 40-mm  angioplasty balloon. I could not get the balloon down to the popliteal  artery and this was tried with a 3-mm balloon. This went a little bit   further, but did not go into the proper popliteal artery. Angioplasty was  performed with a 3-mm balloon. At this point, the wire was exchanged for a  0.014 wire, and a 3-mm balloon on an 0.014 system was then used to  angioplasty a long segment of the popliteal artery with good resolution of  the stenosis. Subsequent arteriogram revealed much brisker improved flow  with minimal residual stenosis down to the distal popliteal artery with  preserved runoff. I felt this intervention should allow a better pressure head of  blood to his foot.    At this point, the wires and catheters were removed. The 40-cm sheath was  exchanged over the Bentson wire for a short 6-French sheath in order to  facilitate the Mynx closure, and this was performed to close the right  femoral artery arteriotomy. I should add that a right anterior obliquity  right femoral arteriogram was performed to ensure proper puncture and  vessel suitable for this type of closure. A short amount of manual pressure  was held, and a dry, sterile dressing was applied. The patient was awoken  and transported to the recovery room in stable condition.        Reviewed on 05/24/2011 6:05 PM          Lenn Sink, MD    cc:    Lenn Sink, MD      AM/wmx; D: 05/24/2011 10:27 A; T: 05/24/2011  4:20 P; DOC#: [id number]; Job#  [id number]

## 2011-05-24 NOTE — Progress Notes (Signed)
Bedside and Verbal shift change report given to Jens Som RN and Ralene Bathe RN (oncoming nurse) by Roselle Locus RN (offgoing nurse).  Report given with SBAR, Kardex and Recent Results.

## 2011-05-24 NOTE — Progress Notes (Addendum)
Cardiology Progress Note         NAME:  ESHAAN TITZER   DOB:   10-Nov-1940   MRN:   [id number]     Assessment/Plan:   NSTEMI/CAD: Occluded RCA and LCx. Pursuing med mgmt. Continue ASA,  BB bid, ACE- I.  statin. Plavix started post angio today. Have arranged OP follow up with Dr Welton Flakes  HTN: may need inc of norvasc, watch   PVD: sfa and poplteal stenoses angioplastied to 3-88mm with resolution of stenoses       CARDIOLOGY ATTENDING  Patient seen and examined.  Agree with above.  Tharon Aquas, MD, [facility]            Subjective:   Cardiac ROS: Just returned from angio this am.  SFA and popliteal stenoses were angioplastied this am. Patient denies any exertional chest pain, dyspnea, palpitations, syncope, orthopnea, edema or paroxysmal nocturnal dyspnea.      Review of Systems:  No nausea, indigestion, vomiting, pain, cough, sputum. No bleeding. Taking po. Appetite fair          Objective:     BP 157/63   Pulse 121   Temp 98.9 ??F (37.2 ??C)   Resp 21   Ht 5\' 6"  (1.676 m)   Wt 85.957 kg (189 lb 8 oz)   BMI 30.59 kg/m2   SpO2 95% O2 Flow Rate (L/min): 2 l/min O2 Device: Nasal cannula    Temp (24hrs), Avg:98.3 ??F (36.8 ??C), Min:97.2 ??F (36.2 ??C), Max:98.9 ??F (37.2 ??C)           12/26 1900 - 12/28 0659  In: 200 [P.O.:200]  Out: 1250 [Urine:1250]  TELE: SR / V paced     General: AAOx3 cooperative, no acute distress.  HEENT: Atraumatic. Pink and moist.  Anicteric sclerae.  Neck : Supple, no thyromegaly.   Lungs: CTA bilaterally. No wheezing/rhonchi/rales.  Heart: Regular rhythm, no murmur, no rubs, no gallops. No JVD. No carotid bruits.   Abdomen: Soft, non-distended, non-tender. + Bowel sounds. No bruits.  Extremities: L great toe with dressing.  Legs shiny, taught, + 1- LE edema. no clubbing, no cyanosis. No calf tenderness  Neurologic: Grossly intact.  Alert and oriented X 3.  No acute neurological distress.   Psych: Fair insight. Not anxious or agitated.        Care Plan discussed with:    Comments    Patient y    Family      RN y    Futures trader                    Consultant:          Data Review:     No results found for this basename: ITNL in the last 72 hours   No results found for this basename: CPK:3,CKMB:3,TROIQ:3, in the last 72 hours  Recent Labs   [facility] 05/24/11 0455 05/23/11 0420 05/22/11 0505    NA 133* 131* 132*    K 4.2 4.4 4.4    CL 99 98 99    CO2 27 27 27     BUN 24* 23* 21*    CREA 1.28* 1.38* 1.44*    GLU 126* 196* 234*    PHOS -- 5.0* 4.6    MG 1.5* 1.6 --    ALB -- -- 2.8*    WBC 5.5 -- 5.9    HGB 9.3* -- 9.5*    HCT 27.6* -- 28.8*    PLT 170 --  133*     No results found for this basename: INR:3,PTP:3,APTT:3, in the last 72 hours    Medications reviewed  Current Facility-Administered Medications   Medication Dose Route Frequency   ??? heparinized saline 2 units/mL infusion 1,000 Units  500 mL Irrigation ONCE   ??? heparinized saline 2 units/mL infusion 1,000 Units  500 mL Irrigation ONCE   ??? lidocaine (XYLOCAINE) 10 mg/mL (1 %) injection 30 mL  30 mL IntraDERMal ONCE   ??? iodixanol (VISIPAQUE) 320 mg/mL contrast injection 200 mL  200 mL InterCATHeter ONCE   ??? fentaNYL citrate (PF) injection 25-100 mcg  25-100 mcg IntraVENous Multiple   ??? midazolam (VERSED) injection 0.5-4 mg  0.5-4 mg IntraVENous Multiple   ??? heparin (porcine) 1,000 unit/mL injection 5,000 Units  5,000 Units IntraVENous ONCE   ??? ioversol (OPTIRAY) 350 mg iodine/mL contrast solution 10-50 mL  10-50 mL InterCATHeter Multiple   ??? heparin (porcine) 1,000 unit/mL injection 1,000 Units  1,000 Units IntraVENous ONCE   ??? clopidogrel (PLAVIX) tablet 75 mg  75 mg Oral DAILY   ??? sodium bicarbonate (8.4%) 150 mEq in dextrose 5% 1,000 mL infusion  0.5-1.5 mL/kg/hr IntraVENous CONTINUOUS   ??? amLODIPine (NORVASC) tablet 5 mg  5 mg Oral DAILY   ??? pneumococcal 23-valent (PNEUMOVAX 23) injection 0.5 mL  0.5 mL IntraMUSCular PRIOR TO DISCHARGE   ??? insulin lispro (HUMALOG) injection 9 Units  9 Units SubCUTAneous TIDAC    ??? metoprolol (LOPRESSOR) tablet 50 mg  50 mg Oral BID   ??? lisinopril (PRINIVIL, ZESTRIL) tablet 20 mg  20 mg Oral DAILY   ??? insulin lispro (HUMALOG) injection   SubCUTAneous AC&HS   ??? acetaminophen (TYLENOL) tablet 650 mg  650 mg Oral Q6H PRN   ??? acetylcysteine (MUCOMYST) 200 mg/mL (20 %) solution 600 mg  600 mg Oral Q12H   ??? 0.9% sodium chloride infusion  75 mL/hr IntraVENous CONTINUOUS   ??? cloNIDine (CATAPRES) tablet 0.1 mg  0.1 mg Oral Q6H PRN   ??? silver sulfADIAZINE (SILVADENE) 1 % topical cream   Topical BID   ??? insulin glargine (LANTUS) injection 20 Units  20 Units SubCUTAneous ACB   ??? sodium hypochlorite (1/4 STRENGTH DAKINS) irrigation (bottle)   Topical DAILY   ??? pantothenic ac-min oil-pet,hyd (AQUAPHOR) 41 % ointment   Topical TID   ??? aspirin chewable tablet 81 mg  81 mg Oral DAILY   ??? levothyroxine (SYNTHROID) tablet 150 mcg  150 mcg Oral ACB   ??? misoprostol (CYTOTEC) tablet 200 mcg  200 mcg Oral BID WITH MEALS   ??? simvastatin (ZOCOR) tablet 20 mg  20 mg Oral QHS   ??? terazosin (HYTRIN) capsule 10 mg  10 mg Oral QHS   ??? SALINE PERIPHERAL FLUSH Q8H Soln 5 mL  5 mL InterCATHeter Q8H   ??? saline peripheral flush Soln 5 mL  5 mL InterCATHeter PRN   ??? oxyCODONE-acetaminophen (PERCOCET) 5-325 mg per tablet 1 Tab  1 Tab Oral Q6H PRN   ??? morphine injection 2 mg  2 mg IntraVENous Q4H PRN   ??? naloxone (NARCAN) injection 0.4 mg  0.4 mg IntraVENous PRN   ??? diphenhydrAMINE (BENADRYL) capsule 25 mg  25 mg Oral Q4H PRN   ??? ondansetron (ZOFRAN) injection 4 mg  4 mg IntraVENous Q4H PRN   ??? magnesium hydroxide (MILK OF MAGNESIA) oral suspension 30 mL  30 mL Oral DAILY PRN   ??? docusate sodium (COLACE) capsule 100 mg  100 mg Oral BID   ??? bisacodyl (  DULCOLAX) suppository 10 mg  10 mg Rectal DAILY PRN   ??? glucose chewable tablet 16 g  4 Tab Oral PRN   ??? dextrose (D50W) injection Syrg 12.5-25 g  12.5-25 g IntraVENous PRN   ??? glucagon (GLUCAGEN) injection 1 mg  1 mg IntraMUSCular PRN    ??? pantoprazole (PROTONIX) tablet 40 mg  40 mg Oral ACB   ??? promethazine (PHENERGAN) 12.5 mg in NS 50 ml IVPB  12.5 mg IntraVENous Q8H PRN   ??? famotidine (PEPCID) tablet 20 mg  20 mg Oral ACB&D         Stephanie A. Vilma Prader, NP

## 2011-05-24 NOTE — Wound Image (Signed)
Wound Nurse Note    Patient seen in follow-up left great toe wound.  Patient currently resting quietly on a Versacare foam mattress; alert and oriented x 3 with some discomfort during wound care.  Patient s/p vascular procedure today per Dr.Mukherjee.  Spoke with nursing; currently treating wound with Acticoat Flex + Silvadene cream after rinsing with 1/4 Dakin's solution.    Assessment:  Left great toe - 2.2cm x 1.8cm area of brown/tan eschar on plantar surface to involve a portion of nail bed where nail is missing.  Wound slightly tender on cleansing; DP and PT pulses audible by doppler.  Foot warm; skin otherwise dry.    Plan:  Patient reports that he plans to follow-up with Dr.Mukherjee for further care related to vascular/wound issues.  Spoke with Dr.Mukherjee to clarify wound care orders; orders approved for daily application of silvadene cream + dry gauze.  Updated orders in chart; will follow as needed.  Reconsult if further assistance is needed.    Judithe Modest RN CWCN (ext [phone])

## 2011-05-24 NOTE — Progress Notes (Signed)
Bedside and Verbal shift change report given to Larry Palmiter, RN (oncoming nurse) by Haddy Mullinax, RN (offgoing nurse).  Report given with SBAR, Kardex, Intake/Output, MAR and Recent Results.

## 2011-05-24 NOTE — Other (Signed)
DTC Progress Note    Recommendations/comments: Chart reviewed on Rande Lawman for length of stay.  BG range from 133-441 mg/dl over last several days. Endo is following. Pt currently on Lantus 20 units, Humalog 9 with meals and correctional scale Humalog with normal sensitivity.  Will continue to follow as needed.    Patient is a 70 y.o. male with a known history of Type 2 diabetes on Lantus and Novolog  at home.    A1c:   Lab Results   Component Value Date/Time    Hemoglobin A1c 10.5 05/17/2011  2:22 AM       POC Glucose last 24hrs:   Lab Results   Component Value Date/Time    POC GLUCOSE 386 05/24/2011 11:17 AM    POC GLUCOSE 173 05/23/2011  9:14 PM    POC GLUCOSE 133 05/23/2011  4:31 PM    POC GLUCOSE 441 05/23/2011 11:32 AM    POC GLUCOSE 309 05/23/2011  8:24 AM    POC GLUCOSE 286 05/22/2011  8:56 PM    POC GLUCOSE 156 05/22/2011  4:28 PM    POC GLUCOSE 401 05/22/2011 11:40 AM       Lab Results   Component Value Date/Time    Creatinine 1.28 05/24/2011  4:55 AM       Thank you.    Barbra Sarks, RD, CDE

## 2011-05-25 LAB — METABOLIC PANEL, BASIC
Anion gap: 5 mmol/L (ref 5–15)
BUN/Creatinine ratio: 16 (ref 12–20)
BUN: 24 MG/DL — ABNORMAL HIGH (ref 6–20)
CO2: 29 MMOL/L (ref 21–32)
Calcium: 8.5 MG/DL (ref 8.5–10.1)
Chloride: 100 MMOL/L (ref 97–108)
Creatinine: 1.48 MG/DL — ABNORMAL HIGH (ref 0.45–1.15)
GFR est AA: 57 mL/min/{1.73_m2} — ABNORMAL LOW (ref >60)
GFR est non-AA: 47 mL/min/{1.73_m2} — ABNORMAL LOW (ref >60)
Glucose: 283 MG/DL — ABNORMAL HIGH (ref 65–100)
Potassium: 4.6 MMOL/L (ref 3.5–5.1)
Sodium: 134 MMOL/L — ABNORMAL LOW (ref 136–145)

## 2011-05-25 LAB — GLUCOSE, POC
Glucose (POC): 132 mg/dL — ABNORMAL HIGH (ref 75–110)
Glucose (POC): 123 mg/dL — ABNORMAL HIGH (ref 75–110)
Glucose (POC): 72 mg/dL — ABNORMAL LOW (ref 75–110)
Glucose (POC): 313 mg/dL — ABNORMAL HIGH (ref 75–110)
Glucose (POC): 266 mg/dL — ABNORMAL HIGH (ref 75–110)

## 2011-05-25 LAB — MAGNESIUM: Magnesium: 1.6 MG/DL (ref 1.6–2.4)

## 2011-05-25 MED ORDER — ALBUTEROL SULFATE HFA 90 MCG/ACTUATION AEROSOL INHALER
90 mcg/actuation | Freq: Four times a day (QID) | RESPIRATORY_TRACT | Status: DC | PRN
Start: 2011-05-25 — End: 2011-05-25
  Administered 2011-05-25: 18:00:00 via RESPIRATORY_TRACT

## 2011-05-25 MED ORDER — INSULIN GLARGINE 100 UNIT/ML INJECTION
100 unit/mL | Freq: Every day | SUBCUTANEOUS | Status: DC
Start: 2011-05-25 — End: 2011-05-25
  Administered 2011-05-25: 17:00:00 via SUBCUTANEOUS

## 2011-05-25 MED ORDER — INSULIN GLARGINE 100 UNIT/ML INJECTION
100 unit/mL | SUBCUTANEOUS | Status: DC
Start: 2011-05-25 — End: 2011-07-24

## 2011-05-25 MED ORDER — ALUMINUM-MAGNESIUM HYDROXIDE 200 MG-200 MG/5 ML ORAL SUSP
200-200 mg/5 mL | Freq: Once | ORAL | Status: DC
Start: 2011-05-25 — End: 2011-05-25

## 2011-05-25 MED ORDER — INSULIN GLARGINE 100 UNIT/ML INJECTION
100 unit/mL | Freq: Every day | SUBCUTANEOUS | Status: DC
Start: 2011-05-25 — End: 2011-05-25

## 2011-05-25 MED ORDER — CLOPIDOGREL 75 MG TAB
75 mg | ORAL_TABLET | Freq: Every day | ORAL | Status: AC
Start: 2011-05-25 — End: 2011-06-24

## 2011-05-25 MED ORDER — AMLODIPINE 5 MG TAB
5 mg | ORAL_TABLET | Freq: Every day | ORAL | Status: AC
Start: 2011-05-25 — End: 2011-06-24

## 2011-05-25 NOTE — Discharge Summary (Addendum)
Physician Discharge Summary     Patient ID:  Travis [name]  [id number]  70 y.o.  [birth year]    Admit date: 05/16/2011    Discharge date and time: 05/25/2011    Admission Diagnoses: NSTEMI (non-ST elevated myocardial infarction)    Discharge Diagnoses:    Principal Diagnosis   NSTEMI (non-ST elevated myocardial infarction)                                             Other Diagnoses   CAD (coronary artery disease) ()   Chronic kidney disease, stage III (moderate) (09/29/2009)   HTN (hypertension) (10/13/2009)   DKA, type 2, not at goal (05/16/2011)   Acute renal failure (ARF) (05/16/2011)   Unspecified hypothyroidism (05/16/2011)   PAD (peripheral artery disease) (05/18/2011)   Ischemic toe ulcer (05/18/2011)     Hospital Course:   NSTEMI (non-ST elevated myocardial infarction) / CAD (coronary artery disease) - Appreciate cardiology assistance. Cath 12/24 showed CAD that cannot be addressed by stents. Continue ASA, and Plavix has been started. Cath in 1999 showed distal disease in the apical LAD and right coronary artery with a normal ejection fraction. Non q MI in 7/06 and cath showed mod LAD abn RCA disease up to 50-60% , small occluded LCX with normal LVEF. Continue ASA, Plavix, BB, prn NTG     Ischemic toe ulcer - Left first toe. Appreciate vascular input and their concern for osteo, however 3 phase scan was negative. Slow healing ulcer likely due to DM/PAD. Tolerated vascular study 12/28 with angioplasty. Watched overnight for complications, heart issues and renal response.     DM with renal complications / DKA, type 2, not at goal - Appreciate endocrine help. BG bottomed out 12/25, up and down since. Continue DM diet, and scheduled Lantus and Lispro, and will add SSI per low dose protocol.      Acute renal failure (ARF) / Hyponatremia / Chronic kidney disease, stage III (moderate) - Appreciate nephrology assistance. Probably near baseline with Cr 1.4. Follow with gentle hydration. He had mucomyst pre-treatment prior to procedure yesterday.     Anemia - Stable. From chronic disease. No active bleeding.     PAD (peripheral artery disease) - Post procedure. PVR tracing 05/18/2011 suggest bilateral SFA occlusion and calf vessel disease and high grade stenosis of his proximal fem-pop Angio March 2012 ([facility]): r sfa-ak pop with prox stenosis, 1 vs r/o; left diffuse sfa , severe tpt disease     HTN (hypertension) - Continue metoprolol, catapress, terazosin, and cardiology just added Norvasc.     Unspecified hypothyroidism - Continue synthroid.     GERD - Continue combination PPI, H2B, mioprostol.     Hyperlipidemia - Continue Zocor     PCP: Phys Other, MD    Consults: cardiology, nephrology and vascular surgery    Significant Diagnostic Studies: See Hospital Course    Discharge Exam:  ???  BP  151/67    ???  Pulse  108    ???  Temp  99.1 ??F (37.3 ??C)    ???  Resp  20    ???  Ht  5\' 6"  (1.676 m)    ???  Wt  200 lb 8 oz    ???  BMI  32.36 kg/m2    ???  SpO2  99%      Physical Exam:   Gen: Well-developed,  well-nourished, in no acute distress   HEENT: Pink conjunctivae, PERRL, hearing intact to voice, moist mucous membranes   Neck: Supple, without masses, thyroid non-tender   Resp: No accessory muscle use, clear breath sounds without wheezes rales or rhonchi   Card: No murmurs, normal S1, S2 without thrills, bruits or peripheral edema   Abd: Soft, non-tender, non-distended, normoactive bowel sounds are present, no mass   Lymph: No cervical or inguinal adenopathy   Musc: No cyanosis or clubbing   Skin: L great toe ulcers, skin turgor is good   Neuro: Cranial nerves are grossly intact, mild motor weakness, follows commands   Psych: Moderate insight, oriented to person, place, alert    Disposition: home    Patient Instructions:    Current Discharge Medication List      START taking these medications    Details   amLODIPine (NORVASC) 5 mg tablet Take 1 Tab by mouth daily for 30 days.  Qty: 30 Tab, Refills: 0      clopidogrel (PLAVIX) 75 mg tablet Take 1 Tab by mouth daily for 30 days.  Qty: 30 Tab, Refills: 0         CONTINUE these medications which have NOT CHANGED    Details   insulin glargine (LANTUS) 100 unit/mL injection 18 Units by SubCUTAneous route daily.        potassium chloride (K-DUR, KLOR-CON) 10 mEq tablet Take 1 Tab by mouth daily.  Qty: 30 Tab, Refills: 6    Comments: PT MAY HAVE WHATEVER FORMULATION HE NORMALLY HAS  Associated Diagnoses: CAD (coronary artery disease); CHF (congestive heart failure); HTN (hypertension); Chronic kidney disease, stage III (moderate); Lightheadedness; Weakness; Anemia, unspecified      insulin aspart (NOVOLOG) 100 unit/mL injection by SubCUTAneous route Before breakfast and dinner.      Associated Diagnoses: CHF (congestive heart failure); CAD (coronary artery disease); Hyperlipidemia; HTN (hypertension); Chronic kidney disease, stage III (moderate); PVD (peripheral vascular disease); Type II or unspecified type diabetes mellitus with renal manifestations, not stated as uncontrolled      bumetanide (BUMEX) 2 mg tablet Take 2 mg by mouth daily.    Associated Diagnoses: CAD (coronary artery disease); Cardiac dysrhythmia, unspecified; CHF (congestive heart failure); Hyperlipidemia; HTN (hypertension); Chronic kidney disease, stage III (moderate)      misoprostol (CYTOTEC) 200 mcg tablet Take 1 Tab by mouth two (2) times a day.  Qty: 60 Tab, Refills: 3    Associated Diagnoses: Peptic ulcer, unspecified site, unspecified as acute or chronic, without mention of hemorrhage, perforation, or obstruction      omeprazole (PRILOSEC) 20 mg capsule Take 20 mg by mouth daily.      simvastatin (ZOCOR) 40 mg tablet Take 20 mg by mouth nightly.       metoprolol (LOPRESSOR) 50 mg tablet Take 1 Tab by mouth two (2) times a day.  Qty: 60 Tab, Refills: 6      lisinopril (PRINIVIL, ZESTRIL) 20 mg tablet Take 1 Tab by mouth nightly.  Qty: 30 Tab, Refills: 6      terazosin (HYTRIN) 5 mg capsule Take 10 mg by mouth nightly.    Associated Diagnoses: Edema      aspirin 81 mg tablet Take  by mouth daily.      omega-3 fatty acids-vitamin e (FISH OIL) 1,000 mg Cap Take  by mouth two (2) times a day.      levothyroxine (SYNTHROID) 150 mcg tablet Take  by mouth daily (before breakfast).      nitroglycerin (  NITROSTAT) 0.4 mg SL tablet by SubLINGual route every five (5) minutes as needed.      ranitidine hcl (ZANTAC) 150 mg capsule Take 150 mg by mouth two (2) times a day.      albuterol (PROVENTIL HFA, VENTOLIN HFA) 90 mcg/actuation inhaler Take 2 Puffs by inhalation every six (6) hours as needed for Wheezing.  Qty: 1 Inhaler, Refills: 1    Associated Diagnoses: CHF (congestive heart failure); CAD (coronary artery disease); Hyperlipidemia; HTN (hypertension); Chronic kidney disease, stage III (moderate); Preop cardiovascular exam           Activity: activity as tolerated  Diet: Cardiac Diet, Diabetic Diet and Low fat, Low cholesterol  Wound Care: None needed    Follow-up with your PCP and cardiology in a few weeks.  Follow-up tests/labs - none    Signed:  Salvadore Dom, MD  05/25/2011  9:26 AM

## 2011-05-25 NOTE — Progress Notes (Signed)
Bedside and Verbal shift change report given to Clara Bosco, RN (oncoming nurse) by Larry Corrion Stirewalt, RN (offgoing nurse).  Report given with SBAR, Kardex, Intake/Output, MAR and Recent Results.

## 2011-05-25 NOTE — Progress Notes (Signed)
[facility]  [address] [place], [place] [address]  [phone]      Medical Progress Note      NAME: Travis [name]   DOB:  April 15, 1941  MRM:  [id number]    Date/Time: 05/25/2011 9:19 AM       Assessment and Plan:     NSTEMI (non-ST elevated myocardial infarction) / CAD (coronary artery disease) - Appreciate cardiology assistance.  Cath 12/24 showed CAD that cannot be addressed by stents.  Continue ASA, and Plavix has been started. Cath in 1999 showed distal disease in the apical LAD and right coronary artery with a normal ejection fraction. Non q MI in 7/06 and cath showed mod LAD abn RCA disease up to 50-60% , small occluded LCX with normal LVEF.   Continue ASA, Plavix, BB, prn NTG    Ischemic toe ulcer - Left first toe.  Appreciate vascular input and their concern for osteo, however 3 phase scan was negative.  Slow healing ulcer likely due to DM/PAD.  Tolerated vascular study 12/28 with angioplasty.  Watched overnight for complications, heart issues and renal response.     DM with renal complications / DKA, type 2, not at goal - Appreciate endocrine help.  BG bottomed out 12/25, up and down since.  Continue DM diet, and scheduled Lantus and Lispro, and will add SSI per low dose protocol.    Acute renal failure (ARF) / Hyponatremia / Chronic kidney disease, stage III (moderate) - Appreciate nephrology assistance. Probably near baseline with Cr 1.4.  Follow with gentle hydration.  He had mucomyst pre-treatment prior to procedure yesterday.    Anemia - Stable.  From chronic disease.  No active bleeding.    PAD (peripheral artery disease) - Post procedure.  PVR tracing 05/18/2011 suggest bilateral SFA occlusion and calf vessel disease and high grade stenosis of his proximal fem-pop Angio March 2012 ([facility]): r sfa-ak pop with prox stenosis, 1 vs r/o; left diffuse sfa , severe tpt disease     HTN (hypertension) - Continue metoprolol, catapress, terazosin, and cardiology just added Norvasc.    Unspecified hypothyroidism - Continue synthroid.    GERD - Continue combination PPI, H2B, mioprostol.    Hyperlipidemia - Continue Zocor          Subjective:     Chief Complaint:  Tolerated vascular study yesterday, feels ready to get up and go home today    ROS: Vaguely describes mild nausea, mild chest pain  (bold if positive, if negative)      Tolerating some PT  Tolerating Diet        Objective:     Last 24hrs VS reviewed since prior progress note. Most recent are:    Visit Vitals   Item Reading   ??? BP 151/67   ??? Pulse 108   ??? Temp 99.1 ??F (37.3 ??C)   ??? Resp 20   ??? Ht 5\' 6"  (1.676 m)   ??? Wt 200 lb 8 oz   ??? BMI 32.36 kg/m2   ??? SpO2 99%     SpO2 Readings from Last 6 Encounters:   05/25/11 99%   11/13/09 98%   11/13/09 98%   11/13/09 98%   10/17/09 100%   10/15/09 98%    O2 Flow Rate (L/min): 2 l/min       Intake/Output Summary (Last 24 hours) at 05/25/11 0918  Last data filed at 05/25/11 0647   Gross per 24 hour   Intake 1433.75  ml   Output   1275 ml   Net 158.75 ml        Physical Exam:    Gen:  Well-developed, well-nourished, in no acute distress  HEENT:  Pink conjunctivae, PERRL, hearing intact to voice, moist mucous membranes  Neck:  Supple, without masses, thyroid non-tender  Resp:  No accessory muscle use, clear breath sounds without wheezes rales or rhonchi  Card:  No murmurs, normal S1, S2 without thrills, bruits or peripheral edema  Abd:  Soft, non-tender, non-distended, normoactive bowel sounds are present, no mass  Lymph:  No cervical or inguinal adenopathy  Musc:  No cyanosis or clubbing  Skin:  L great toe ulcers, skin turgor is good  Neuro:  Cranial nerves are grossly intact, mild motor weakness, follows commands   Psych:  Moderate insight, oriented to person, place, alert    Telemetry reviewed:   normal sinus rhythm  __________________________________________________________________   Medications Reviewed: (see below)  Medications:     Current Facility-Administered Medications   Medication Dose Route Frequency   ??? fentaNYL citrate (PF) injection 25-100 mcg  25-100 mcg IntraVENous Multiple   ??? midazolam (VERSED) injection 0.5-4 mg  0.5-4 mg IntraVENous Multiple   ??? ioversol (OPTIRAY) 350 mg iodine/mL contrast solution 10-50 mL  10-50 mL InterCATHeter Multiple   ??? clopidogrel (PLAVIX) tablet 75 mg  75 mg Oral DAILY   ??? sodium bicarbonate (8.4%) 150 mEq in dextrose 5% 1,000 mL infusion  0.5-1.5 mL/kg/hr IntraVENous CONTINUOUS   ??? pantothenic ac-min oil-pet,hyd (AQUAPHOR) 41 % ointment   Topical TID   ??? silver sulfADIAZINE (SILVADENE) 1 % topical cream   Topical DAILY   ??? amLODIPine (NORVASC) tablet 5 mg  5 mg Oral DAILY   ??? insulin lispro (HUMALOG) injection 9 Units  9 Units SubCUTAneous TIDAC   ??? metoprolol (LOPRESSOR) tablet 50 mg  50 mg Oral BID   ??? lisinopril (PRINIVIL, ZESTRIL) tablet 20 mg  20 mg Oral DAILY   ??? insulin lispro (HUMALOG) injection   SubCUTAneous AC&HS   ??? acetaminophen (TYLENOL) tablet 650 mg  650 mg Oral Q6H PRN   ??? cloNIDine (CATAPRES) tablet 0.1 mg  0.1 mg Oral Q6H PRN   ??? insulin glargine (LANTUS) injection 20 Units  20 Units SubCUTAneous ACB   ??? sodium hypochlorite (1/4 STRENGTH DAKINS) irrigation (bottle)   Topical DAILY   ??? aspirin chewable tablet 81 mg  81 mg Oral DAILY   ??? levothyroxine (SYNTHROID) tablet 150 mcg  150 mcg Oral ACB   ??? misoprostol (CYTOTEC) tablet 200 mcg  200 mcg Oral BID WITH MEALS   ??? simvastatin (ZOCOR) tablet 20 mg  20 mg Oral QHS   ??? terazosin (HYTRIN) capsule 10 mg  10 mg Oral QHS   ??? SALINE PERIPHERAL FLUSH Q8H Soln 5 mL  5 mL InterCATHeter Q8H   ??? saline peripheral flush Soln 5 mL  5 mL InterCATHeter PRN   ??? oxyCODONE-acetaminophen (PERCOCET) 5-325 mg per tablet 1 Tab  1 Tab Oral Q6H PRN   ??? morphine injection 2 mg  2 mg IntraVENous Q4H PRN   ??? naloxone (NARCAN) injection 0.4 mg  0.4 mg IntraVENous PRN    ??? diphenhydrAMINE (BENADRYL) capsule 25 mg  25 mg Oral Q4H PRN   ??? ondansetron (ZOFRAN) injection 4 mg  4 mg IntraVENous Q4H PRN   ??? magnesium hydroxide (MILK OF MAGNESIA) oral suspension 30 mL  30 mL Oral DAILY PRN   ??? docusate sodium (COLACE) capsule 100 mg  100 mg Oral BID   ??? bisacodyl (DULCOLAX) suppository 10 mg  10 mg Rectal DAILY PRN   ??? glucose chewable tablet 16 g  4 Tab Oral PRN   ??? dextrose (D50W) injection Syrg 12.5-25 g  12.5-25 g IntraVENous PRN   ??? glucagon (GLUCAGEN) injection 1 mg  1 mg IntraMUSCular PRN   ??? pantoprazole (PROTONIX) tablet 40 mg  40 mg Oral ACB   ??? promethazine (PHENERGAN) 12.5 mg in NS 50 ml IVPB  12.5 mg IntraVENous Q8H PRN   ??? famotidine (PEPCID) tablet 20 mg  20 mg Oral ACB&D        Lab Data Reviewed: (see below)  Lab Review:     Recent Labs   [facility] 05/24/11 0455    WBC 5.5    HGB 9.3*    HCT 27.6*    PLT 170     Recent Labs   Basename 05/24/11 0455 05/23/11 0420    NA 133* 131*    K 4.2 4.4    CL 99 98    CO2 27 27    GLU 126* 196*    BUN 24* 23*    CREA 1.28* 1.38*    CA 8.9 9.3    MG 1.5* 1.6    PHOS -- 5.0*    ALB -- --    TBIL -- --    SGOT -- --    INR -- --     Lab Results   Component Value Date/Time    POC GLUCOSE 132 05/24/2011  8:25 PM    POC GLUCOSE 277 05/24/2011  4:21 PM    POC GLUCOSE 386 05/24/2011 11:17 AM    POC GLUCOSE 173 05/23/2011  9:14 PM    POC GLUCOSE 133 05/23/2011  4:31 PM       Other pertinent lab: none    Total time spent with patient: 35 Minutes                  Care Plan discussed with: Patient, Family, Nursing Staff and >50% of time spent in counseling and coordination of care    Discussed:  Care Plan    Prophylaxis:  H2B/PPI    Disposition:  Home w/Family           ___________________________________________________    Attending Physician: Salvadore Dom, MD

## 2011-05-25 NOTE — Progress Notes (Signed)
physical Therapy EVALUATION  Patient: Travis [name] (70 y.o. male)  Date: 05/25/2011  Primary Diagnosis: NSTEMI (non-ST elevated myocardial infarction)        Precautions: Other (comment) (wedge shoe on the left to off load toes.)    ASSESSMENT :  Based on the objective data described below, the patient presents with shortness of breath at rest. PT received new order to evaluate patient for need for home oxygen and to perform 6 minute walk. Patient attempting to refuse PT initially citing that he doesn't feel well. Persuaded patient to at least try. At rest on 2 liters oxygen patient 91% and HR 104bpm and patient complains of difficulty getting his breath at rest. Placed on room air and initiated mobility. Patient unsteady, needing assistance to stand and ambulate. This is contrary to PT evaluation performed 8 days ago on 12/21when patient was independent. Patient required hand held assist for safe ambulation and was attempting to reach for furniture. He desaturated to 82-83% and HR 110 bpm and refused more than 2 minutes of 6 minute walk test. At rest in chair he was placed back on 2 liters oxygen and rebounded after 2 minutes to 90% but with significant dyspnea. Patient reports he has not had this difficulty with his breathing or walking until today. Informed RN. He will need assistance at home, home oxygen for during the day, and home oxygen if he's agreeable.Goals not formed today as patient has orders for discharge. If he remains at [facility], will continue to follow and will form goals next session.    Patient will benefit from skilled intervention to address the above impairments.  Patient???s rehabilitation potential is considered to be Guarded  Factors which may influence rehabilitation potential include:   []          None noted  []          Mental ability/status  [x]          Medical condition  []          Home/family situation and support systems  []          Safety awareness   []          Pain tolerance/management  []          Other:      PLAN :  Recommendations and Planned Interventions:  []            Bed Mobility Training             []     Neuromuscular Re-Education  [x]            Transfer Training                   []     Orthotic/Prosthetic Training  [x]            Gait Training                         []     Modalities  []            Therapeutic Exercises           []     Edema Management/Control  [x]            Therapeutic Activities            [x]     Patient and Family Training/Education  []            Other (comment):    Frequency/Duration: Patient will be followed by physical therapy if  he remains at [facility] Discharge Recommendations: Home Health  Further Equipment Recommendations for Discharge: rolling walker     SUBJECTIVE:   Patient stated ???I don't feel good and I don't want to do any walking.???    OBJECTIVE DATA SUMMARY:     Past Medical History   Diagnosis Date   ??? CAD (coronary artery disease)      see problem list   ??? HTN (hypertension)    ??? PVD (peripheral vascular disease)    ??? DM (diabetes mellitus)    ??? Hyperlipidemia    ??? Sleep apnea      on CPAP   ??? COPD (chronic obstructive pulmonary disease)      mild COPD, recurrent pneumonia, chronic bronchitis (followed by Dr. Leroy Libman)   ??? CKD (chronic kidney disease)      Cr 1.7 in 10/09   ??? CHF (congestive heart failure)    ??? Anemia    ??? Pulmonary hypertension      PASP 60 on echo 06/22/09   ??? Thromboembolus      leg    ??? Thyroid disease    ??? Pneumonia    ??? GERD (gastroesophageal reflux disease)    ??? Chronic pain      back pain   ??? Cataract    ??? GI bleed      multiple bleeding episodes and blood transfusions.  Dr. Monika Salk has followed..  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     Past Surgical History   Procedure Date   ??? Hx orthopaedic      knee replacement   ??? Pr vascular surgery procedure unlist      arterial bypass   ??? Hx pacemaker    ??? Pr cardiac surg procedure unlist      cardiac stent   ??? Hx cataract removal      removed rt eye      Prior Level of Function/Home Situation: independent  Home Situation  Home Environment: Private residence  # Steps to Enter: 5   Rails to Enter: Yes  Hand Rails : Bilateral  One/Two Story Residence: Two story  # of Interior Steps: 12   Height of Each Step (in): 4 inches  Interior Rails: Left  Lift Chair Available: No  Living Alone: Yes   Support Systems: Child(ren)  Patient Expects to be Discharged to:: Private residence  Current DME Used/Available at Home: Gilmer Mor, straight  Tub or Shower Type: Tub/Shower combination  Critical Behavior:  Neurologic State: Appropriate for age  Orientation Level: Oriented X4  Cognition: Follows commands  Safety/Judgement: Awareness of environment;Good awareness of safety precautions  Skin:  NT  Strength:    Strength: Generally decreased, functional                    Sensation:                  Sensation: Intact               Range Of Motion:  AROM: Generally decreased, functional                              Functional Mobility:  Bed Mobility:     Supine to Sit: Additional time;Independent  Sit to Supine: Additional time;Independent     Transfers:  Sit to Stand: CGA  Stand to Sit: CGA  Stand Pivot Transfers: Min assist, patient performs 75-90% (hand held assist)  Balance:   Sitting: Intact;Without support  Standing: Intact;With support  Standing - Static: Good  Standing - Dynamic : Fair (needs constant hand held support)  Ambulation/Gait Training:  Distance (ft): 80 Feet (ft)  Assistive Device: Gait belt (and hand held support)  Ambulation - Level of Assistance: Minimal assistance        Gait Abnormalities: Antalgic     Left Side Weight Bearing: Heel  Base of Support: Narrowed                            Pain:  Pain Scale 1: Numeric (0 - 10)  Pain Intensity 1: will not rate pain              Activity Tolerance:   Very poor. See above assessment    After treatment:   [x]          Patient left in no apparent distress sitting up in chair   []          Patient left in no apparent distress in bed  [x]          Call bell left within reach  [x]          Nursing notified  []          Caregiver present  []          Bed alarm activated    COMMUNICATION/EDUCATION:   The patient???s plan of care was discussed with: Registered Nurse.  [x]          Fall prevention education was provided and the patient/caregiver indicated understanding.  []          Patient/family have participated as able in goal setting and plan of care.  []          Patient/family agree to work toward stated goals and plan of care.  [x]          Patient understands intent and goals of therapy, but is neutral about his/her participation.  []          Patient is unable to participate in goal setting and plan of care.    Thank you for this referral.  JENNIFER A VEST, PT

## 2011-05-25 NOTE — Progress Notes (Signed)
70 yo man with diffuse atherosclerosis presenting with non ST MI 12/20 with subsequent cardiac cath demonstrating 30% prox LAD disease with 80% distal LAD stenosis and occlusion of both RCA and LCx.    ECHO 12/21 shows EF 45-50% with mild to mod MR  PTCA of left superficial femoral and popliteal artery(unrelated to cath of 05/16/11)  Tele:   Sinus rhythm with intrinsic conduction alternating with sinus rhythm with ventricular pacing; normal DDD function  K+4.2   Mg++1.5  BUN 28   Creatinine 1.28    No Known Allergies    Current Facility-Administered Medications   Medication Dose Route Frequency   ??? insulin glargine (LANTUS) injection 18 Units  18 Units SubCUTAneous ACB   ??? clopidogrel (PLAVIX) tablet 75 mg  75 mg Oral DAILY   ??? sodium bicarbonate (8.4%) 150 mEq in dextrose 5% 1,000 mL infusion  0.5-1.5 mL/kg/hr IntraVENous CONTINUOUS   ??? pantothenic ac-min oil-pet,hyd (AQUAPHOR) 41 % ointment   Topical TID   ??? silver sulfADIAZINE (SILVADENE) 1 % topical cream   Topical DAILY   ??? amLODIPine (NORVASC) tablet 5 mg  5 mg Oral DAILY   ??? insulin lispro (HUMALOG) injection 9 Units  9 Units SubCUTAneous TIDAC   ??? metoprolol (LOPRESSOR) tablet 50 mg  50 mg Oral BID   ??? lisinopril (PRINIVIL, ZESTRIL) tablet 20 mg  20 mg Oral DAILY   ??? insulin lispro (HUMALOG) injection   SubCUTAneous AC&HS   ??? acetaminophen (TYLENOL) tablet 650 mg  650 mg Oral Q6H PRN   ??? cloNIDine (CATAPRES) tablet 0.1 mg  0.1 mg Oral Q6H PRN   ??? aspirin chewable tablet 81 mg  81 mg Oral DAILY   ??? levothyroxine (SYNTHROID) tablet 150 mcg  150 mcg Oral ACB   ??? misoprostol (CYTOTEC) tablet 200 mcg  200 mcg Oral BID WITH MEALS   ??? simvastatin (ZOCOR) tablet 20 mg  20 mg Oral QHS   ??? terazosin (HYTRIN) capsule 10 mg  10 mg Oral QHS   ??? SALINE PERIPHERAL FLUSH Q8H Soln 5 mL  5 mL InterCATHeter Q8H   ??? saline peripheral flush Soln 5 mL  5 mL InterCATHeter PRN   ??? magnesium hydroxide (MILK OF MAGNESIA) oral suspension 30 mL  30 mL Oral DAILY PRN    ??? docusate sodium (COLACE) capsule 100 mg  100 mg Oral BID   ??? pantoprazole (PROTONIX) tablet 40 mg  40 mg Oral ACB   ??? promethazine (PHENERGAN) 12.5 mg in NS 50 ml IVPB  12.5 mg IntraVENous Q8H PRN   ??? famotidine (PEPCID) tablet 20 mg  20 mg Oral ACB&D     BP 151/67   Pulse 108   Temp 99 ??F (37.2 ??C)   Resp 20   Ht 5\' 6"  (1.676 m)   Wt 200 lb 8 oz (90.946 kg)   BMI 32.36 kg/m2   SpO2 99%  NECK: Reveals no evidence of carotid bruits. There is no thyroid enlargement or nodularity. There is minimal jugular venous distention.  CHEST: a bit tight but no frank wheezing.  CARDIOVASCULAR: Reveals PMI at the left midclavicular line with regular heart rate and soft systolic ejection murmur; no gallop.  Pacemaker site in the right infraclavicular space is unremarkable.  ABDOMINAL: Soft, nontender.  EXTREMITIES: Decreased pedal pulses. Mild edema. Right groin cath site stable; no hematoma.  Necrosis of distal aspect left great toe is noted.      Would switch Lisinopril to hs, and resume proventil which he uses at home.  Ok for discharge today on current meds with follow-up Dr. Kelby Fam  C. Everardo Beals, MD    F.A.C.C.

## 2011-05-25 NOTE — Progress Notes (Signed)
.  I have reviewed discharge instructions with the patient and daughter.  The patient and daughter verbalized understanding.

## 2011-05-25 NOTE — Progress Notes (Signed)
CARE MANAGEMENT ON CALL NOTE: Order received from MD for home oxygen 2 liters continuous, portable 02 cannister, rolling walker & make sure previously ordered home health has been arranged (Daily wound care). I spoke to pt via telephone, who informed me he lives in a 2 story home by himself & to date he has had no problems with mobility/functioning at home. Pt states he has never received home health before & has no preference of agency. Home health wound care has been previously arranged with [facility]. I spoke to [facility] at [facility] to inform her pt is to be discharged to home today. Misty stated home health will begin tomorrow 12/30. Ardelle Park, RN on floor will fax discharge order & discharge instructions to [facility] at [phone]. All other clinicals/demographics have been sent to [facility] via [facility]. I also made arrangements with Mikle Bosworth at [facility] (ph # [phone]) to deliver portable 02 cannister to pt's hospital room this afternoon & he will set up oxygen at pt's home today after discharge. Mikle Bosworth informed me that because this is Saturday & a holiday weekend,  company will not be able to verify insurance until next week. Pt will need to sign an Advanced Beneficiary form confirming he will be able to pay for the 02 at $380/mo out of pocket if the insurance denies the claim. I explained this to pt over the phone & pt agrees to signing the form. Clara will provide Mikle Bosworth with copy of 02 order & facesheet when he delivers the 02 cannister today. The rest of the clinicals have been sent to [facility] via [place]. Pt has also been provided with a rolling walker from [facility] DME closet. This has been provided by [facility] & paperwork will be sent to them on Monday-Facesheet & clinicals have been sent to [facility] via [employer]. I have informed pt of discharge plan & all agencies used. Pt agrees to the discharge plan & has no questions. [place] of the Patient's Freedom of  Choice Form will be placed on chart. Christene Lye, RN CCM

## 2011-05-25 NOTE — Progress Notes (Signed)
Notified Dr.Wallace about the BS 72 mg this am.orders received for holding an regular insulin and change in the dose of Lantus insulin daily

## 2011-05-29 NOTE — Telephone Encounter (Signed)
Travis [name] said her dad has started to feel bad again since being back on the norvasc-was put back on in the hosp (Dr Welton Flakes had previously taken him off it). Also having problems feeling orthostatic. BP today 115/51. I asked her to ck the mg's but as it turned out, they have not filled the script. He is still taking his metoprolol 50 mg twice daily, lisinopril 20 daily & bumex 2 mg. Did not fill plavix script either. Had been changed to Effient prior to June 2011, then Effient stopped due to GI bleeding, anemia and procrit shots; asa increased to 325 mg in Sept 2011. Will ask Dr Welton Flakes about the Norvasc and Plavix but told Travis [name] to have him hold the lisinopril, at least, in the AM until I could hear back from Dr Welton Flakes. Pls advise.

## 2011-05-31 NOTE — Telephone Encounter (Signed)
I spoke to patient.  ASA 81 and plavix 75 OK.  Stop Lisinopril and Norvasc.  Bumex to hold until his dizziness is better and not to take the days he is dizzy.   OK to continue metoprolol.     Can you please call daughter tomorrow and make sure she has this correct.   Thanks,  [employer]

## 2011-05-31 NOTE — Telephone Encounter (Signed)
I reached his dtr Travis [name] this AM and gave her the info.

## 2011-06-04 NOTE — Progress Notes (Signed)
Lm at 4 pt numbers listed-including em contact- for patient to remind of appt 1-10 with Dr Welton Flakes- he is post hospital dc on 12-29-treated for NSTEMI,hf-NN unable to reach thus far. Several attempts by phone. GIT letter sent.

## 2011-06-07 NOTE — Progress Notes (Signed)
Notified by coumadin nurse that call was received by [facility] , stating that patient is receiving HH, wound care and physical therapy, and that he needed to cancel yesterday's Dr Welton Flakes appt but will reschedule on his own.    Will f/u

## 2011-06-25 ENCOUNTER — Encounter

## 2011-06-25 MED ORDER — CLOPIDOGREL 75 MG TAB
75 mg | ORAL_TABLET | Freq: Every day | ORAL | Status: AC
Start: 2011-06-25 — End: ?

## 2011-06-25 NOTE — Telephone Encounter (Signed)
Pt notified the plavix was faxed to the pharmacy but not the misoprostol (cytotec) since this is not cardiac. I remind him of this every few months. I tried to reach his dtr but her number is temp disconnected and he said he would tell her.

## 2011-07-17 NOTE — Telephone Encounter (Signed)
Pt-fever since Monday, today 99.9, threw up yesterday, HR 108 resp 24 & labored, 146/56 lungs "full", moist fairly constant cough but has only gained 2 pounds in 10 days. Dtr is going to take him to take him to hosp later. Told HH nurse this is fine. I tried to reach, [name], dtr but no answer. I also let Dr Welton Flakes know.

## 2011-07-18 ENCOUNTER — Inpatient Hospital Stay
Admit: 2011-07-18 | Discharge: 2011-07-24 | Disposition: A | Discharge status: Home Health | Payer: MEDICARE | Attending: Internal Medicine | Admitting: Internal Medicine

## 2011-07-18 DIAGNOSIS — I214 Non-ST elevation (NSTEMI) myocardial infarction: Secondary | ICD-10-CM

## 2011-07-18 LAB — URINALYSIS W/ REFLEX CULTURE
Bacteria: NEGATIVE /HPF
Bilirubin: NEGATIVE
Glucose: 500 MG/DL — AB
Ketone: 15 MG/DL — AB
Leukocyte Esterase: NEGATIVE
Nitrites: NEGATIVE
Specific gravity: 1.015 (ref 1.003–1.030)
Urobilinogen: 0.2 EU/DL (ref 0.2–1.0)
pH (UA): 5 (ref 5.0–8.0)

## 2011-07-18 LAB — INFLUENZA A & B AG (RAPID TEST)
Influenza A Antigen: NEGATIVE
Influenza B Antigen: NEGATIVE

## 2011-07-18 NOTE — ED Notes (Signed)
ntoed

## 2011-07-18 NOTE — ED Provider Notes (Signed)
I have personally seen and evaluated patient. I find the patient's history and physical exam are consistent with the PA's NP documentation. I agree with the care provided, treatments rendered, disposition and follow up plan.

## 2011-07-18 NOTE — ED Notes (Signed)
ED EKG interpretation:  Rhythm: normal sinus rhythm; and regular . Rate (approx.): 97; Axis: normal; P wave: normal; QRS interval: normal ; ST/T wave: non-specific changes; in  Lead: cx; Other findings: abnormal ekg. This EKG was interpreted by Salomon Fick, PA,ED Provider.

## 2011-07-18 NOTE — ED Provider Notes (Signed)
HPI Comments: Patient presents with complaints of generalized weakness and fatigue x 1 week. With vomiting x 12 hours. Patient reports "I just don't feel well". Denies fever or chills. Patient reports non-productive cough.    Patient is a 71 y.o. male presenting with vomiting and fatigue. The history is provided by the patient.   Vomiting   This is a new problem. The current episode started 12 to 24 hours ago. The problem occurs 2 to 4 times per day. The problem has not changed since onset.The emesis has an appearance of stomach contents. There has been no fever. Pertinent negatives include no chills, no fever, no sweats, no abdominal pain, no diarrhea, no headaches, no arthralgias, no myalgias, no cough, no URI and no headaches. The patient is not pregnant.His past medical history is significant for DM. His pertinent negatives include no irritable bowel syndrome, no inflammatory bowel disease, no short gut syndrome, no bowel resection, no recent abdominal surgery, no malabsorption and no gastric bypass.   Fatigue  This is a new problem. The current episode started more than 2 days ago. The problem has been gradually worsening. There was no focality noted. Pertinent negatives include no focal weakness, no loss of sensation, no loss of balance, no slurred speech, no speech difficulty, no memory loss, no movement disorder, no agitation, no visual change, no auditory change, no mental status change, no unresponsiveness and patient does not experience disorientation. There has been no fever. Associated symptoms include vomiting. Pertinent negatives include no shortness of breath, no chest pain, no altered mental status, patient does not experience confusion, no headaches, no choking, no nausea, no bowel incontinence and no bladder incontinence. There were no medications administered prior to arrival. Associated medical issues do not include trauma, mood changes, bleeding disorder, seizures, dementia, CVA or clotting  disorder.        Past Medical History   Diagnosis Date   ??? CAD (coronary artery disease)      see problem list   ??? HTN (hypertension)    ??? PVD (peripheral vascular disease)    ??? DM (diabetes mellitus)    ??? Hyperlipidemia    ??? Sleep apnea      on CPAP   ??? COPD (chronic obstructive pulmonary disease)      mild COPD, recurrent pneumonia, chronic bronchitis (followed by Dr. Leroy Libman)   ??? CKD (chronic kidney disease)      Cr 1.7 in 10/09   ??? CHF (congestive heart failure)    ??? Anemia    ??? Pulmonary hypertension      PASP 60 on echo 06/22/09   ??? Thromboembolus      leg    ??? Thyroid disease    ??? Pneumonia    ??? GERD (gastroesophageal reflux disease)    ??? Chronic pain      back pain   ??? Cataract    ??? GI bleed      multiple bleeding episodes and blood transfusions.  Dr. Monika Salk has followed..  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        Past Surgical History   Procedure Date   ??? Hx orthopaedic      knee replacement   ??? Pr vascular surgery procedure unlist      arterial bypass   ??? Hx pacemaker    ??? Pr cardiac surg procedure unlist      cardiac stent   ??? Hx cataract removal      removed rt eye  Family History   Problem Relation Age of Onset   ??? Hypertension     ??? Cancer Mother      cervical and colon   ??? Cancer Father      lung        History     Social History   ??? Marital Status: SINGLE     Spouse Name: N/A     Number of Children: N/A   ??? Years of Education: N/A     Occupational History   ??? Not on file.     Social History Main Topics   ??? Smoking status: Former Smoker -- 20 years     Types: Cigarettes     Quit date: 11/09/1978   ??? Smokeless tobacco: Never Used   ??? Alcohol Use: No   ??? Drug Use: No   ??? Sexually Active: Not on file     Other Topics Concern   ??? Not on file     Social History Narrative   ??? No narrative on file                  ALLERGIES: Review of patient's allergies indicates no known allergies.      Review of Systems   Constitutional: Positive for fatigue. Negative for fever and chills.   HENT: Negative.    Eyes: Negative.     Respiratory: Negative.  Negative for cough, choking and shortness of breath.    Cardiovascular: Negative.  Negative for chest pain.   Gastrointestinal: Positive for vomiting. Negative for nausea, abdominal pain, diarrhea and bowel incontinence.   Genitourinary: Negative.  Negative for bladder incontinence.   Musculoskeletal: Negative.  Negative for myalgias and arthralgias.   Skin: Negative.    Neurological: Positive for weakness. Negative for focal weakness, speech difficulty, headaches and loss of balance.   Hematological: Negative.    Psychiatric/Behavioral: Negative.  Negative for memory loss, confusion, agitation and altered mental status.   All other systems reviewed and are negative.        Filed Vitals:    07/18/11 1752   BP: 149/67   Pulse: 97   Temp: 100 ??F (37.8 ??C)   Resp: 18   Height: 6' (1.829 m)   Weight: 83.915 kg (185 lb)   SpO2: 95%            Physical Exam   Nursing note and vitals reviewed.  Constitutional: He is oriented to person, place, and time. He appears well-developed and well-nourished.   HENT:   Head: Normocephalic and atraumatic.   Right Ear: External ear normal.   Left Ear: External ear normal.   Mouth/Throat: Oropharynx is clear and moist. No oropharyngeal exudate.   Eyes: Conjunctivae and EOM are normal. Pupils are equal, round, and reactive to light. Right eye exhibits no discharge. Left eye exhibits no discharge. No scleral icterus.   Neck: Normal range of motion. Neck supple. No tracheal deviation present. No thyromegaly present.   Cardiovascular: Normal rate, regular rhythm, normal heart sounds and intact distal pulses.    No murmur heard.  Pulmonary/Chest: Effort normal and breath sounds normal. No respiratory distress. He has no decreased breath sounds. He has no wheezes. He has no rales.   Abdominal: Soft. Normal appearance and bowel sounds are normal. He exhibits no distension. There is no tenderness. There is no rebound, no guarding and no CVA tenderness.    Musculoskeletal: Normal range of motion. He exhibits no edema and no tenderness.   Lymphadenopathy:  He has no cervical adenopathy.   Neurological: He is alert and oriented to person, place, and time. No cranial nerve deficit. Coordination normal.   Skin: Skin is warm. No rash noted. No erythema.   Psychiatric: He has a normal mood and affect. His behavior is normal. Judgment and thought content normal.        MDM    Procedures

## 2011-07-18 NOTE — ED Notes (Signed)
Discussed with Dr. Dagoberto Reef.  Will have Hospitalist admit.  Patient resting with no pain.  Examined left foot dressing removed, positive ulcerative tissue with area of black eschar medial great toe.  No purlent drainage.  Lopressor added

## 2011-07-18 NOTE — ED Notes (Signed)
TRANSFER - OUT REPORT:    Verbal report given to Ravy RN(name) on Travis [name]  being transferred to Bradly Bienenstock RN   (unit) for routine progression of care       Report consisted of patient???s Situation, Background, Assessment and   Recommendations(SBAR).     Information from the following report(s) SBAR, ED Summary, Intake/Output and MAR was reviewed with the receiving nurse.    Opportunity for questions and clarification was provided.

## 2011-07-18 NOTE — H&P (Signed)
[facility]  [address] [place], [place]  [address]  [phone]    Hospitalist Admission Note      NAME:  Travis [name]   DOB:   01/16/1954   MRN:  [id number]     PCP:  Sol Blazing, MD     Date/Time:  07/18/2011 8:03 PM          Subjective:     CHIEF COMPLAINT: Weak     HISTORY OF PRESENT ILLNESS:     Mr. Remer is a 71 y.o.  African American male who is admitted for NSTEMI.  Mr. Dente presented to the Emergency Department this PM complaining of weakness: moderate to severe, constant, present for the last few days, associated with nausea and vomiting today but denies any chest pain.    Source of Information: patient, relative(s) and past medical records      Past Medical History   Diagnosis Date   ??? CAD (coronary artery disease)      see problem list   ??? HTN (hypertension)    ??? PVD (peripheral vascular disease)    ??? DM (diabetes mellitus)    ??? Hyperlipidemia    ??? Sleep apnea      on CPAP   ??? COPD (chronic obstructive pulmonary disease)      mild COPD, recurrent pneumonia, chronic bronchitis (followed by Dr. Leroy Libman)   ??? CKD (chronic kidney disease)      Cr 1.7 in 10/09   ??? CHF (congestive heart failure)    ??? Anemia    ??? Pulmonary hypertension      PASP 60 on echo 06/22/09   ??? Thromboembolus      leg    ??? Thyroid disease    ??? Pneumonia    ??? GERD (gastroesophageal reflux disease)    ??? Chronic pain      back pain   ??? Cataract    ??? GI bleed      multiple bleeding episodes and blood transfusions.  Dr. Monika Salk has followed..  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   ??? CKD (chronic kidney disease) stage 3, GFR 30-59 ml/min 07/18/2011   ??? Foot ulcer 07/18/2011        Past Surgical History   Procedure Date   ??? Hx orthopaedic      knee replacement   ??? Pr vascular surgery procedure unlist      arterial bypass   ??? Hx pacemaker    ??? Pr cardiac surg procedure unlist      cardiac stent   ??? Hx cataract removal      removed rt eye       History   Substance Use Topics   ??? Smoking status: Former Smoker -- 20 years      Types: Cigarettes     Quit date: 11/09/1978   ??? Smokeless tobacco: Never Used   ??? Alcohol Use: No        Family History   Problem Relation Age of Onset   ??? Hypertension     ??? Cancer Mother      cervical and colon   ??? Cancer Father      lung        No Known Allergies     Prior to Admission medications    Medication Sig Start Date End Date Taking? Authorizing Provider   Ferrous Sulfate (SLOW FE) 47.5 mg iron TbER tablet Take 1 Tab by mouth two (2) times a day.  Yes Historical Provider   clopidogrel (PLAVIX) 75 mg tablet Take 1 Tab by mouth daily. 06/25/11  Yes Laddie Aquas, MD   insulin glargine (LANTUS) 100 unit/mL injection Use 18 units of your usual Glargine each day 05/25/11  Yes Salvadore Dom, MD   albuterol (PROVENTIL HFA, VENTOLIN HFA) 90 mcg/actuation inhaler Take 2 Puffs by inhalation every six (6) hours as needed for Wheezing. 03/11/11  Yes Laddie Aquas, MD   potassium chloride (K-DUR, KLOR-CON) 10 mEq tablet Take 1 Tab by mouth daily. 02/05/11  Yes Laddie Aquas, MD   insulin aspart (NOVOLOG) 100 unit/mL injection by SubCUTAneous route Before breakfast and dinner.     Yes Historical Provider   bumetanide (BUMEX) 2 mg tablet Take 2 mg by mouth daily.   Yes Historical Provider   misoprostol (CYTOTEC) 200 mcg tablet Take 1 Tab by mouth two (2) times a day. 01/26/10  Yes Laddie Aquas, MD   omeprazole (PRILOSEC) 20 mg capsule Take 20 mg by mouth daily.   Yes Historical Provider   simvastatin (ZOCOR) 40 mg tablet Take 20 mg by mouth nightly.   Yes Historical Provider   metoprolol (LOPRESSOR) 50 mg tablet Take 1 Tab by mouth two (2) times a day. 11/13/09  Yes Laddie Aquas, MD   lisinopril (PRINIVIL, ZESTRIL) 20 mg tablet Take 1 Tab by mouth nightly. 11/13/09  Yes Laddie Aquas, MD   terazosin (HYTRIN) 5 mg capsule Take 10 mg by mouth nightly.   Yes Historical Provider   aspirin 81 mg tablet Take  by mouth daily.   Yes Historical Provider   omega-3 fatty acids-vitamin e (FISH OIL) 1,000 mg Cap Take  by mouth two (2) times a  day.   Yes Historical Provider   levothyroxine (SYNTHROID) 150 mcg tablet Take  by mouth daily (before breakfast).   Yes Historical Provider   nitroglycerin (NITROSTAT) 0.4 mg SL tablet by SubLINGual route every five (5) minutes as needed.   Yes Historical Provider   ranitidine hcl (ZANTAC) 150 mg capsule Take 150 mg by mouth two (2) times a day.   Yes Historical Provider       Review of Systems:  (bold if positive, if negative)    Gen:  fatigueEyes:  ENT:  CVS:  dizzinessPulm:  dyspneaGI:  nausea, emesis  GU:    MS:  Skin:  Psych:  Endo:    Hem:  Renal:    Neuro:            Objective:      VITALS:    Vital signs reviewed; most recent are:    Visit Vitals   Item Reading   ??? BP 144/59   ??? Pulse 95   ??? Temp 100 ??F (37.8 ??C)   ??? Resp 20   ??? Ht 6' (1.829 m)   ??? Wt 185 lb   ??? BMI 25.09 kg/m2   ??? SpO2 96%     SpO2 Readings from Last 6 Encounters:   07/18/11 96%   05/25/11 97%   11/13/09 98%   11/13/09 98%   11/13/09 98%   10/17/09 100%        No intake or output data in the 24 hours ending 07/18/11 2003         Exam:     Physical Exam:    Gen:  Well-developed, well-nourished, elderly, chronically ill-appearing, in no acute distress  HEENT:  Pink conjunctivae, PERRL, hearing intact to voice, moist mucous membranes  Neck:  Supple, without masses,  thyroid non-tender  Resp:  No accessory muscle use, clear breath sounds without wheezes rales or rhonchi  Card:  No murmurs, normal S1, S2 without thrills, bruits or peripheral edema  Abd:  Soft, non-tender, non-distended, normoactive bowel sounds are present, no palpable organomegaly and no detectable hernias  Lymph:  No cervical or inguinal adenopathy  Musc:  No cyanosis or clubbing  Skin:  No rashes, chronic appearing ulcers over left 1st, 2nd and 3rd toes distally, skin turgor is good  Neuro:  Cranial nerves are grossly intact, no focal motor weakness, follows commands appropriately  Psych:  Good insight, oriented to person, place and time, alert       Labs:    Recent Labs    Basename 07/18/11 1840    WBC 7.4    HGB 8.3*    HCT 25.4*    PLT 183     Recent Labs   Basename 07/18/11 1840    NA --    K --    CL --    CO2 --    GLU --    BUN --    CREA --    CA --    MG --    PHOS --    ALB 2.8*    TBIL 0.7    SGOT 28     Lab Results   Component Value Date/Time    POC GLUCOSE 471 07/18/2011  6:59 PM    POC GLUCOSE 266 05/25/2011  4:31 PM     No results found for this basename: PH:1,PCO2:1,PO2:1,HCO3:1,FIO2:1 in the last 72 hours  No results found for this basename: INR:1 in the last 72 hours  Lab Results   Component Value Date/Time    Specimen Description: NARES 09/29/2009 11:45 PM    Specimen Description: NARES 08/08/2009 10:50 PM    Specimen Description: NARES 08/06/2009  2:00 PM     Lab Results   Component Value Date/Time    Culture result:  Value: MRSA NOT PRESENT     Screening of patient nares for MRSA is for surveillance purposes and, if positive, to facilitate isolation considerations in high risk settings. It is not intended for automatic decolonization interventions per se as regimens are not sufficiently effective to warrant routine use. 09/29/2009 11:45 PM    Culture result:  Value: MRSA NOT PRESENT     Screening of patient nares for MRSA is for surveillance purposes and, if positive, to facilitate isolation considerations in high risk settings. It is not intended for automatic decolonization interventions per se as regimens are not sufficiently effective to warrant routine use. 08/08/2009 10:50 PM    Culture result:  Value: MRSA NOT PRESENT     Screening of patient nares for MRSA is for surveillance purposes and, if positive, to facilitate isolation considerations in high risk settings. It is not intended for automatic decolonization interventions per se as regimens are not sufficiently effective to warrant routine use. 08/06/2009  2:00 PM     All other current labs reviewed in the computer.       Assessment:      Principal Problem:   *NSTEMI (non-ST elevated myocardial infarction)  (05/16/2011)  Active Problems:     CAD (coronary artery disease) ()   Cath 1999 distal disease in the apical LAD and right coronary artery with    a normal ejection fraction.   Non q MI in 7/06.cath - mod LAD abn RCA disease up to 50-60% , small    occluded LCX with  normal LVEF.     (?NSTEMI) during admission with pneumonia and ARF 05/2009 - proceeded to    have a PCI   (DES) to RCA.     NSTEMI (trop ~30) on 06/21/09 but cath showed stable findings.     NSTEMI with another cath in 2/11 - the stent was patent and no new    findings.     Multiple NSTEMI's in 5/11 - proceeded on 10/16/09 to PCI of 85% ulcerated    plaque mid RCA successfully stented with 2.75 Xience.  CTO Cx successful    PTCA and DES of proximal part with 2.25 Atom.            Anemia, unspecified (09/29/2009)     Hyperlipidemia (10/06/2009)     HTN (hypertension) (10/13/2009)     DKA, type 2, not at goal (05/16/2011)     Unspecified hypothyroidism (05/16/2011)     PAD (peripheral artery disease) (05/18/2011)   PVR tracing 05/18/2011 suggest bilateral SFA occlusion and calf vessel    disease    and high grade stenosis of his proximal fem-pop   Angio March 2012 ([facility]): r sfa-ak pop with prox stenosis, 1 vs r/o; left    diffuse sfa , severe tpt disease     CKD (chronic kidney disease) stage 3, GFR 30-59 ml/min (07/18/2011)     Foot ulcer (07/18/2011)          Plan:       Principal Problem:   *NSTEMI (non-ST elevated myocardial infarction) (05/16/2011)/CAD (coronary artery disease) ()   - anticoagulate with heparin   - follow serial cardiac enzymes   - Cardiology to see     Anemia, unspecified (09/29/2009)   - per daughter due for IV iron   - will consult Renal to see if the want to give it while he is here     Hyperlipidemia (10/06/2009)   - continue statin and fish oil     HTN (hypertension) (10/13/2009)   - BP okay, continue usual meds     DKA, type 2, not at goal (05/16/2011)   - uncontrolled, took insulin this AM, likely out of control due to acute MI   - agree  with drip started in ED   - hold Lantus for now, may be able to restart tomorrow   - consult DTC     Unspecified hypothyroidism (05/16/2011)   - continue LT4 at home dose     PAD (peripheral artery disease) (05/18/2011)/Foot ulcer (07/18/2011)   - has been working with Vascular Surgery as outpatient and apparently perfusion is better per daughter   - will have Wound Care see for toe ulcer     CKD (chronic kidney disease) stage 3, GFR 30-59 ml/min (07/18/2011)   - creatinine just a little above baseline   - hydrate and follow      Code Status   - patient is DO NOT RESUSCITATE      Telemetry reviewed:   normal sinus rhythm    Risk of deterioration: high      Total time spent with patient: 50 Minutes                  Care Plan discussed with: Patient, Family, Nursing Staff and Dr. Mort Sawyers    Discussed:  Code Status, Care Plan and D/C Planning       ___________________________________________________    Attending Physician: Marijo File, MD

## 2011-07-18 NOTE — ED Notes (Cosign Needed)
EJ performed by [facility] to right external jugular vein with 20 gauge aspirates and flushes well

## 2011-07-18 NOTE — Consults (Addendum)
Reason for Consult: NSTEMI      HPI: This is a 71 yo with PMH of CAD, Stents, HTN, PVD admitted with weakness, nausea and vomiting. His POC enzymes were positive with EKG changes. He denies chest pain, SOB, lightheadedness, dizziness, presyncope or syncope. Has no fever, chills or cough. Has no dysuria.         Past Medical History   Diagnosis Date   ??? CAD (coronary artery disease)      see problem list   ??? HTN (hypertension)    ??? PVD (peripheral vascular disease)    ??? DM (diabetes mellitus)    ??? Hyperlipidemia    ??? Sleep apnea      on CPAP   ??? COPD (chronic obstructive pulmonary disease)      mild COPD, recurrent pneumonia, chronic bronchitis (followed by Dr. Leroy Libman)   ??? CKD (chronic kidney disease)      Cr 1.7 in 10/09   ??? CHF (congestive heart failure)    ??? Anemia    ??? Pulmonary hypertension      PASP 60 on echo 06/22/09   ??? Thromboembolus      leg    ??? Thyroid disease    ??? Pneumonia    ??? GERD (gastroesophageal reflux disease)    ??? Chronic pain      back pain   ??? Cataract    ??? GI bleed      multiple bleeding episodes and blood transfusions.  Dr. Monika Salk has followed..  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   ??? CKD (chronic kidney disease) stage 3, GFR 30-59 ml/min 07/18/2011   ??? Foot ulcer 07/18/2011            Past Surgical History   Procedure Date   ??? Hx orthopaedic      knee replacement   ??? Pr vascular surgery procedure unlist      arterial bypass   ??? Hx pacemaker    ??? Pr cardiac surg procedure unlist      cardiac stent   ??? Hx cataract removal      removed rt eye             Family History   Problem Relation Age of Onset   ??? Hypertension     ??? Cancer Mother      cervical and colon   ??? Cancer Father      lung           History     Social History   ??? Marital Status: SINGLE     Spouse Name: N/A     Number of Children: N/A   ??? Years of Education: N/A     Occupational History   ??? Not on file.     Social History Main Topics   ??? Smoking status: Former Smoker -- 20 years     Types: Cigarettes     Quit date: 11/09/1978   ??? Smokeless  tobacco: Never Used   ??? Alcohol Use: No   ??? Drug Use: No   ??? Sexually Active: Not on file     Other Topics Concern   ??? Not on file     Social History Narrative   ??? No narrative on file         No Known Allergies         Current Facility-Administered Medications   Medication Dose Route Frequency Provider Last Rate Last Dose   ??? sodium chloride 0.9 % bolus infusion 250 mL  250 mL IntraVENous ONCE Maurice Small Trice, PA 1,000 mL/hr at 07/18/11 1924 250 mL at 07/18/11 1924   ??? insulin regular (NOVOLIN, HUMULIN) injection 10 Units  10 Units IntraVENous NOW Salomon Fick, PA   10 Units at 07/18/11 1907   ??? aspirin (ASPIRIN) tablet 325 mg  325 mg Oral NOW Maurice Small Trice, PA   325 mg at 07/18/11 1907   ??? nitroglycerin (NITROBID) 2 % ointment 1 Inch  1 Inch Topical BID Salomon Fick, [place]   1 Inch at 07/18/11 1924   ??? enoxaparin (LOVENOX) injection 80 mg  1 mg/kg SubCUTAneous NOW Maurice Small Trice, PA   80 mg at 07/18/11 1921   ??? metoprolol (LOPRESSOR) injection 5 mg  5 mg IntraVENous NOW Kavin Leech, DO   5 mg at 07/18/11 1951   ??? insulin regular (NOVOLIN, HUMULIN) 100 Units in 0.9% sodium chloride 100 mL infusion  1-50 Units/hr IntraVENous TITRATE Marijo File, MD 7.5 mL/hr at 07/18/11 2058 7.5 Units/hr at 07/18/11 2058   ??? insulin regular (NOVOLIN, HUMULIN) injection   IntraVENous TIDPC Marijo File, MD       ??? glucose chewable tablet 16 g  4 Tab Oral PRN Marijo File, MD       ??? dextrose (D50W) injection Syrg 12.5-25 g  12.5-25 g IntraVENous PRN Marijo File, MD       ??? glucagon (GLUCAGEN) injection 1 mg  1 mg IntraMUSCular PRN Marijo File, MD       ??? pantoprazole (PROTONIX) tablet 40 mg  40 mg Oral ACB Marijo File, MD       ??? aspirin tablet 81 mg  81 mg Oral DAILY Marijo File, MD       ??? .PHARMACY TO SUBSTITUTE PER PROTOCOL     Marijo File, MD       ??? levothyroxine (SYNTHROID) tablet 150 mcg  150 mcg Oral ACB Marijo File, MD       ??? terazosin (HYTRIN) capsule 10 mg  10 mg Oral QHS Marijo File, MD       ???  metoprolol (LOPRESSOR) tablet 50 mg  50 mg Oral BID Marijo File, MD       ??? lisinopril (PRINIVIL, ZESTRIL) tablet 20 mg  20 mg Oral QHS Marijo File, MD       ??? simvastatin (ZOCOR) tablet 20 mg  20 mg Oral QHS Marijo File, MD       ??? misoprostol (CYTOTEC) tablet 200 mcg  200 mcg Oral BID Marijo File, MD       ??? bumetanide (BUMEX) tablet 2 mg  2 mg Oral DAILY Marijo File, MD       ??? .PHARMACY TO SUBSTITUTE PER PROTOCOL     Marijo File, MD       ??? albuterol (PROVENTIL HFA, VENTOLIN HFA) inhaler 2 Puff  2 Puff Inhalation Q6H PRN Marijo File, MD       ??? clopidogrel (PLAVIX) tablet 75 mg  75 mg Oral DAILY Marijo File, MD       ??? 0.9% sodium chloride infusion  100 mL/hr IntraVENous CONTINUOUS Marijo File, MD       ??? nitroglycerin (NITROBID) 2 % ointment 1 Inch  1 Inch Topical Q6H Marijo File, MD       ??? heparin (porcine) injection 5,050 Units  60 Units/kg IntraVENous ONCE Marijo File, MD       ??? heparin 25,000 units in D5W 250 ml infusion  12-25 Units/kg/hr IntraVENous TITRATE Marijo File, MD       ???  oxyCODONE-acetaminophen (PERCOCET 7.5) 7.5-325 mg per tablet 1 Tab  1 Tab Oral Q4H PRN Marijo File, MD       ??? morphine injection 2 mg  2 mg IntraVENous Q5MIN PRN Marijo File, MD       ??? zolpidem (AMBIEN) tablet 5 mg  5 mg Oral QHS PRN Marijo File, MD       ??? prochlorperazine (COMPAZINE) injection 5 mg  5 mg IntraVENous Q8H PRN Marijo File, MD       ??? promethazine (PHENERGAN) injection 12.5 mg  12.5 mg IntraVENous Q6H PRN Marijo File, MD       ??? acetaminophen (TYLENOL) tablet 650 mg  650 mg Oral Q4H PRN Marijo File, MD       ??? docusate sodium (COLACE) capsule 100 mg  100 mg Oral BID Marijo File, MD       ??? bisacodyl (DULCOLAX) suppository 10 mg  10 mg Rectal DAILY PRN Marijo File, MD       ??? DISCONTD: insulin regular (NOVOLIN, HUMULIN) injection 10 Units  10 Units SubCUTAneous NOW Maurice Small Trice, PA       ??? DISCONTD: insulin regular (NOVOLIN, HUMULIN) 100 Units in 0.9%  sodium chloride 100 mL infusion  1-10 Units/hr IntraVENous TITRATE Salomon Fick, PA   5.7 Units/hr at 07/18/11 2004        ROS:  12 point review of systems was performed. All negative except for HPI     Physical Exam:  BP 141/54   Pulse 89   Temp 100 ??F (37.8 ??C)   Resp 28   Ht 6' (1.829 m)   Wt 185 lb (83.915 kg)   BMI 25.09 kg/m2   SpO2 94%    Gen:  Well-developed, well-nourished, in no acute distress  HEENT:  PALE conjunctivae, PERRL, hearing intact to voice, moist mucous membranes  Neck:  Supple, without masses, thyroid non-tender  Resp:  No accessory muscle use, clear breath sounds without wheezes rales or rhonchi  Card:  No murmurs, normal S1, S2 without thrills, bruits or peripheral edema  Abd:  Soft, non-tender, non-distended, normoactive bowel sounds are present, no palpable organomegaly and no detectable hernias  Lymph:  No cervical or inguinal adenopathy  Musc:  No cyanosis or clubbing  Skin:  No rashes. Right foot ulcers are dry and under cover with an bandage. The skin turgor is good.   Neuro:  Cranial nerves are grossly intact, no focal motor weakness, follows commands appropriately  Psych:  Good insight, oriented to person, place and time, alert     Labs:       Component Value Date/Time   WBC 7.4 07/18/2011  6:40 PM   Hemoglobin (POC) 8.8 07/18/2011  6:52 PM   HGB 8.3 07/18/2011  6:40 PM   Hematocrit (POC) 26 07/18/2011  6:52 PM   HCT 25.4 07/18/2011  6:40 PM   PLATELET 183 07/18/2011  6:40 PM   MCV 87.9 07/18/2011  6:40 PM       Component Value Date/Time   Hemoglobin A1c 10.5 05/17/2011  2:22 AM   Hemoglobin A1c 8.7 06/22/2009  4:55 AM   Hemoglobin A1c 9.8 06/01/2009  3:20 AM   LDL, calculated 58.4 05/17/2011  2:22 AM   Creatinine (POC) 1.9 07/18/2011  6:52 PM   Creatinine 1.48 05/25/2011  1:00 PM        Component Value Date/Time   Cholesterol, total 123 05/17/2011  2:22 AM   HDL Cholesterol 55 05/17/2011  2:22 AM  LDL, calculated 58.4 05/17/2011  2:22 AM   Triglyceride 48 05/17/2011  2:22 AM   CHOL/HDL Ratio  2.2 05/17/2011  2:22 AM       Component Value Date/Time   ALT 16 07/18/2011  6:40 PM   AST 28 07/18/2011  6:40 PM   Alk. phosphatase 88 07/18/2011  6:40 PM   Bilirubin, direct 0.2 07/18/2011  6:40 PM   Bilirubin, total 0.7 07/18/2011  6:40 PM     Lab Results   Component Value Date/Time    INR 1.1 05/20/2011  8:20 AM    INR 1.2 05/17/2011  2:22 AM    INR 1.2 10/17/2009  4:18 AM    Prothrombin time 11.7 05/20/2011  8:20 AM    Prothrombin time 13.0 05/17/2011  2:22 AM    Prothrombin time 11.9 10/17/2009  4:18 AM        Component Value Date/Time   GFR est AA 57 05/25/2011  1:00 PM   GFR est non-AA 47 05/25/2011  1:00 PM   Creatinine (POC) 1.9 07/18/2011  6:52 PM   Creatinine 1.48 05/25/2011  1:00 PM   BUN 24 05/25/2011  1:00 PM   BUN (POC) 34 07/18/2011  6:52 PM   Sodium (POC) 136 07/18/2011  6:52 PM   Sodium 134 05/25/2011  1:00 PM   Potassium 4.6 05/25/2011  1:00 PM   Potassium (POC) 4.2 07/18/2011  6:52 PM   Chloride (POC) 101 07/18/2011  6:52 PM   Chloride 100 05/25/2011  1:00 PM   CO2 29 05/25/2011  1:00 PM        Component Value Date/Time   TSH 1.82 10/03/2009 10:42 AM      Lab Results   Component Value Date/Time    Glucose 283 05/25/2011  1:00 PM    POC GLUCOSE 346 07/18/2011  7:59 PM         Diagnostic Tests:   EKG: normal sinus rhythm. First degree AVB. Non specific conduction delay. ST depression in the lateral leads. ST elevation in the inferior leads with Q waves. These are new compared to prior EKG.        Problem List:     Patient Active Problem List   Diagnoses Code   ??? CAD (coronary artery disease) 414.00   ??? Anemia, unspecified 285.9   ??? Back pain 724.5   ??? Hyperlipidemia 272.4   ??? HTN (hypertension) 401.9   ??? NSTEMI (non-ST elevated myocardial infarction) 410.70   ??? DKA, type 2, not at goal 250.12   ??? Unspecified hypothyroidism 244.9   ??? PAD (peripheral artery disease) 447.9   ??? Ischemic toe ulcer 707.15   ??? CKD (chronic kidney disease) stage 3, GFR 30-59 ml/min 585.3   ??? Foot ulcer 707.15        Plan:  1. NSTEMI  with known severe CAD with known RCA and LCX stent occlusions. Hemodynamically stable. No chest pain. ASA, lovenox, lopressor given in ER. Continue these. Track Trop trend. Likely the event appears to have happened >12 hrs ago given CPK elevation. Cath decision based on the hemodynamic stability and symptoms.   2. Check 2D Echo given new event.   3. Has pacemaker. Currently in SR.   4. BP control with his home meds  5. Statins  6. DM control.   7. Prognosis guarded.       Terez Freimark K. Dagoberto Reef, MD, [facility]

## 2011-07-18 NOTE — ED Notes (Signed)
Vomiting x 12 hours.  Generalized weakness x 48 hours.    Pt reports "I just don't feel well."

## 2011-07-18 NOTE — ED Notes (Signed)
Assumed care of pt. Pt. Resting in stretcher with call bell at bedside.

## 2011-07-18 NOTE — Progress Notes (Signed)
TRANSFER - IN REPORT:    Verbal report received from Marylene Land, RN (name) on Travis [name]  being received from ED (unit) for routine progression of care      Report consisted of patient???s Situation, Background, Assessment and   Recommendations(SBAR).     Information from the following report(s) SBAR, Kardex, Intake/Output, MAR and Recent Results was reviewed with the receiving nurse.    Opportunity for questions and clarification was provided.      Assessment completed upon patient???s arrival to unit and care assumed.

## 2011-07-19 LAB — PTT
aPTT: 104.1 s — CR (ref 24.0–31.5)
aPTT: 35.3 s — ABNORMAL HIGH (ref 24.0–31.5)
aPTT: 47.6 s — ABNORMAL HIGH (ref 24.0–31.5)
aPTT: 67.1 s — ABNORMAL HIGH (ref 24.0–31.5)

## 2011-07-19 LAB — POC CHEM8
Anion gap (POC): 21 mmol/L — ABNORMAL HIGH (ref 5–15)
BUN (POC): 34 MG/DL — ABNORMAL HIGH (ref 9–20)
CO2 (POC): 19 MMOL/L — ABNORMAL LOW (ref 21–32)
Calcium, ionized (POC): 1.05 MMOL/L — ABNORMAL LOW (ref 1.12–1.32)
Chloride (POC): 101 MMOL/L (ref 98–107)
Creatinine (POC): 1.9 MG/DL — ABNORMAL HIGH (ref 0.6–1.3)
GFRAA, POC: 43 mL/min/{1.73_m2} — ABNORMAL LOW (ref >60)
GFRNA, POC: 35 mL/min/{1.73_m2} — ABNORMAL LOW (ref >60)
Glucose (POC): 439 MG/DL — ABNORMAL HIGH (ref 75–110)
Hematocrit (POC): 26 % — ABNORMAL LOW (ref 36.6–50.3)
Hemoglobin (POC): 8.8 GM/DL — ABNORMAL LOW (ref 12.1–17.0)
Potassium (POC): 4.2 MMOL/L (ref 3.5–5.1)
Sodium (POC): 136 MMOL/L (ref 136–145)

## 2011-07-19 LAB — CBC WITH AUTOMATED DIFF
ABS. BASOPHILS: 0 10*3/uL (ref 0.0–0.1)
ABS. EOSINOPHILS: 0 10*3/uL (ref 0.0–0.4)
ABS. LYMPHOCYTES: 0.6 10*3/uL — ABNORMAL LOW (ref 0.8–3.5)
ABS. MONOCYTES: 0.5 10*3/uL (ref 0.0–1.0)
ABS. NEUTROPHILS: 6.3 10*3/uL (ref 1.8–8.0)
BASOPHILS: 0 % (ref 0–1)
EOSINOPHILS: 0 % (ref 0–7)
HCT: 25.4 % — ABNORMAL LOW (ref 36.6–50.3)
HGB: 8.3 g/dL — ABNORMAL LOW (ref 12.1–17.0)
LYMPHOCYTES: 8 % — ABNORMAL LOW (ref 12–49)
MCH: 28.7 PG (ref 26.0–34.0)
MCHC: 32.7 g/dL (ref 30.0–36.5)
MCV: 87.9 FL (ref 80.0–99.0)
MONOCYTES: 7 % (ref 5–13)
NEUTROPHILS: 85 % — ABNORMAL HIGH (ref 32–75)
PLATELET: 183 10*3/uL (ref 150–400)
RBC: 2.89 M/uL — ABNORMAL LOW (ref 4.10–5.70)
RDW: 16.3 % — ABNORMAL HIGH (ref 11.5–14.5)
WBC: 7.4 10*3/uL (ref 4.1–11.1)

## 2011-07-19 LAB — GLUCOSE, POC
Glucose (POC): 104 mg/dL (ref 75–110)
Glucose (POC): 109 mg/dL (ref 75–110)
Glucose (POC): 120 mg/dL — ABNORMAL HIGH (ref 75–110)
Glucose (POC): 157 mg/dL — ABNORMAL HIGH (ref 75–110)
Glucose (POC): 222 mg/dL — ABNORMAL HIGH (ref 75–110)
Glucose (POC): 311 mg/dL — ABNORMAL HIGH (ref 75–110)
Glucose (POC): 346 mg/dL — ABNORMAL HIGH (ref 75–110)
Glucose (POC): 42 mg/dL — CL (ref 75–110)
Glucose (POC): 471 mg/dL — ABNORMAL HIGH (ref 75–110)
Glucose (POC): 48 mg/dL — CL (ref 75–110)
Glucose (POC): 48 mg/dL — CL (ref 75–110)
Glucose (POC): 49 mg/dL — CL (ref 75–110)
Glucose (POC): 80 mg/dL (ref 75–110)
Glucose (POC): 83 mg/dL (ref 75–110)
Glucose (POC): 86 mg/dL (ref 75–110)
Glucose (POC): 89 mg/dL (ref 75–110)
Glucose (POC): 95 mg/dL (ref 75–110)
Glucose (POC): 98 mg/dL (ref 75–110)

## 2011-07-19 LAB — EKG, 12 LEAD, INITIAL
Atrial Rate: 97 {beats}/min
Atrial Rate: 82 {beats}/min
Calculated P Axis: 75 degrees
Calculated P Axis: 39 degrees
Calculated R Axis: 89 degrees
Calculated R Axis: 81 degrees
Calculated T Axis: 146 degrees
Calculated T Axis: -119 degrees
Diagnosis: NORMAL
P-R Interval: 184 ms
P-R Interval: 190 ms
Q-T Interval: 420 ms
Q-T Interval: 442 ms
QRS Duration: 122 ms
QRS Duration: 126 ms
QTC Calculation (Bezet): 533 ms
QTC Calculation (Bezet): 516 ms
Ventricular Rate: 97 {beats}/min
Ventricular Rate: 82 {beats}/min

## 2011-07-19 LAB — CBC W/O DIFF
HCT: 21.2 % — ABNORMAL LOW (ref 36.6–50.3)
HGB: 7.1 g/dL — ABNORMAL LOW (ref 12.1–17.0)
MCH: 29.3 PG (ref 26.0–34.0)
MCHC: 33.5 g/dL (ref 30.0–36.5)
MCV: 87.6 FL (ref 80.0–99.0)
PLATELET: 186 10*3/uL (ref 150–400)
RBC: 2.42 M/uL — ABNORMAL LOW (ref 4.10–5.70)
RDW: 16.8 % — ABNORMAL HIGH (ref 11.5–14.5)
WBC: 5.6 10*3/uL (ref 4.1–11.1)

## 2011-07-19 LAB — TROPONIN I
Troponin-I, Qt.: 4.77 ng/mL — ABNORMAL HIGH (ref <0.05)
Troponin-I, Qt.: 25.38 ng/mL — ABNORMAL HIGH (ref <0.05)
Troponin-I, Qt.: 26.85 ng/mL — ABNORMAL HIGH (ref <0.05)
Troponin-I, Qt.: 17.72 ng/mL — ABNORMAL HIGH (ref <0.05)

## 2011-07-19 LAB — METABOLIC PANEL, BASIC
Anion gap: 6 mmol/L (ref 5–15)
BUN/Creatinine ratio: 19 (ref 12–20)
BUN: 35 MG/DL — ABNORMAL HIGH (ref 6–20)
CO2: 31 MMOL/L (ref 21–32)
Calcium: 8.1 MG/DL — ABNORMAL LOW (ref 8.5–10.1)
Chloride: 103 MMOL/L (ref 97–108)
Creatinine: 1.8 MG/DL — ABNORMAL HIGH (ref 0.45–1.15)
GFR est AA: 45 mL/min/{1.73_m2} — ABNORMAL LOW (ref >60)
GFR est non-AA: 37 mL/min/{1.73_m2} — ABNORMAL LOW (ref >60)
Glucose: 61 MG/DL — ABNORMAL LOW (ref 65–100)
Potassium: 3.7 MMOL/L (ref 3.5–5.1)
Sodium: 140 MMOL/L (ref 136–145)

## 2011-07-19 LAB — HEPATIC FUNCTION PANEL
A-G Ratio: 0.5 — ABNORMAL LOW (ref 1.1–2.2)
ALT (SGPT): 16 U/L (ref 12–78)
AST (SGOT): 28 U/L (ref 15–37)
Albumin: 2.8 g/dL — ABNORMAL LOW (ref 3.5–5.0)
Alk. phosphatase: 88 U/L (ref 50–136)
Bilirubin, direct: 0.2 MG/DL (ref 0.0–0.2)
Bilirubin, total: 0.7 MG/DL (ref 0.2–1.0)
Globulin: 5.6 g/dL — ABNORMAL HIGH (ref 2.0–4.0)
Protein, total: 8.4 g/dL — ABNORMAL HIGH (ref 6.4–8.2)

## 2011-07-19 LAB — CK
CK: 464 U/L — ABNORMAL HIGH (ref 39–308)
CK: 436 U/L — ABNORMAL HIGH (ref 39–308)
CK: 448 U/L — ABNORMAL HIGH (ref 39–308)

## 2011-07-19 LAB — MAGNESIUM: Magnesium: 1.9 MG/DL (ref 1.6–2.4)

## 2011-07-19 LAB — CK W/ CKMB & INDEX
CK - MB: 20.6 NG/ML — ABNORMAL HIGH (ref 0.5–3.6)
CK-MB Index: 9.8 — ABNORMAL HIGH (ref 0–2.5)
CK: 211 U/L (ref 39–308)

## 2011-07-19 LAB — POC TROPONIN-I: Troponin-I (POC): 2.42 ng/mL — ABNORMAL HIGH (ref 0.00–0.08)

## 2011-07-19 LAB — NT-PRO BNP: NT pro-BNP: 6191 PG/ML — ABNORMAL HIGH (ref 0–125)

## 2011-07-19 LAB — PHOSPHORUS: Phosphorus: 3.9 MG/DL (ref 2.5–4.9)

## 2011-07-19 LAB — OCCULT BLOOD, STOOL: Occult blood, stool: POSITIVE — AB

## 2011-07-19 MED ORDER — NITROGLYCERIN 2 % TRANSDERMAL OINTMENT
2 % | Freq: Four times a day (QID) | TRANSDERMAL | Status: DC
Start: 2011-07-19 — End: 2011-07-21
  Administered 2011-07-19 – 2011-07-21 (×9): via TOPICAL

## 2011-07-19 MED ORDER — HEPARIN (PORCINE) 5,000 UNIT/ML IJ SOLN
5000 unit/mL | Freq: Once | INTRAMUSCULAR | Status: DC
Start: 2011-07-19 — End: 2011-07-18

## 2011-07-19 MED ORDER — HEPARIN (PORCINE) IN D5W 25,000 UNIT/250 ML IV
25000 unit/250 mL(100 unit/mL) | INTRAVENOUS | Status: DC
Start: 2011-07-19 — End: 2011-07-21
  Administered 2011-07-19 – 2011-07-21 (×9): via INTRAVENOUS

## 2011-07-19 MED ORDER — MISOPROSTOL 200 MCG TAB
200 mcg | Freq: Two times a day (BID) | ORAL | Status: DC
Start: 2011-07-19 — End: 2011-07-24
  Administered 2011-07-19 – 2011-07-24 (×12): via ORAL

## 2011-07-19 MED ORDER — EPOETIN ALFA 10,000 UNIT/ML IJ SOLN
10000 unit/mL | INTRAMUSCULAR | Status: DC
Start: 2011-07-19 — End: 2011-07-24
  Administered 2011-07-19: 19:00:00 via SUBCUTANEOUS

## 2011-07-19 MED ORDER — .PHARMACY TO SUBSTITUTE PER PROTOCOL
Status: DC
Start: 2011-07-19 — End: 2011-07-18

## 2011-07-19 MED ORDER — PANTOPRAZOLE 40 MG TAB, DELAYED RELEASE
40 mg | Freq: Every day | ORAL | Status: DC
Start: 2011-07-19 — End: 2011-07-19
  Administered 2011-07-19: 14:00:00 via ORAL

## 2011-07-19 MED ORDER — TERAZOSIN 5 MG CAP
5 mg | Freq: Every evening | ORAL | Status: DC
Start: 2011-07-19 — End: 2011-07-24
  Administered 2011-07-19 – 2011-07-24 (×6): via ORAL

## 2011-07-19 MED ORDER — POTASSIUM CHLORIDE SR 10 MEQ TAB
10 mEq | Freq: Every day | ORAL | Status: DC
Start: 2011-07-19 — End: 2011-07-20
  Administered 2011-07-19: 14:00:00 via ORAL

## 2011-07-19 MED ORDER — LISINOPRIL 20 MG TAB
20 mg | Freq: Every evening | ORAL | Status: DC
Start: 2011-07-19 — End: 2011-07-24
  Administered 2011-07-19 – 2011-07-24 (×6): via ORAL

## 2011-07-19 MED ORDER — ZOLPIDEM 5 MG TAB
5 mg | Freq: Every evening | ORAL | Status: DC | PRN
Start: 2011-07-19 — End: 2011-07-24

## 2011-07-19 MED ORDER — METOPROLOL TARTRATE 5 MG/5 ML IV SOLN
5 mg/ mL | INTRAVENOUS | Status: AC
Start: 2011-07-19 — End: 2011-07-18
  Administered 2011-07-19: 01:00:00 via INTRAVENOUS

## 2011-07-19 MED ORDER — BISACODYL 10 MG RECTAL SUPPOSITORY
10 mg | Freq: Every day | RECTAL | Status: DC | PRN
Start: 2011-07-19 — End: 2011-07-24

## 2011-07-19 MED ORDER — INSULIN REGULAR HUMAN 100 UNIT/ML INJECTION
100 unit/mL | INTRAMUSCULAR | Status: DC
Start: 2011-07-19 — End: 2011-07-18
  Administered 2011-07-19: 01:00:00 via INTRAVENOUS

## 2011-07-19 MED ORDER — INSULIN REGULAR HUMAN 100 UNIT/ML INJECTION
100 unit/mL | INTRAMUSCULAR | Status: DC
Start: 2011-07-19 — End: 2011-07-18
  Administered 2011-07-19: via SUBCUTANEOUS

## 2011-07-19 MED ORDER — FISH OIL-OMEGA-3 FATTY ACIDS 1,000 MG-340 MG CAP
340-1000 mg | Freq: Two times a day (BID) | ORAL | Status: DC
Start: 2011-07-19 — End: 2011-07-24
  Administered 2011-07-19 – 2011-07-24 (×10): via ORAL

## 2011-07-19 MED ORDER — ASPIRIN 81 MG TAB, DELAYED RELEASE
81 mg | Freq: Every day | ORAL | Status: DC
Start: 2011-07-19 — End: 2011-07-21
  Administered 2011-07-19 – 2011-07-21 (×3): via ORAL

## 2011-07-19 MED ORDER — SODIUM CHLORIDE 0.9 % INJECTION
40 mg | Freq: Two times a day (BID) | INTRAMUSCULAR | Status: DC
Start: 2011-07-19 — End: 2011-07-23
  Administered 2011-07-19 – 2011-07-23 (×9): via INTRAVENOUS

## 2011-07-19 MED ORDER — SODIUM CHLORIDE 0.9 % IV
INTRAVENOUS | Status: DC
Start: 2011-07-19 — End: 2011-07-22
  Administered 2011-07-19 – 2011-07-20 (×2): via INTRAVENOUS

## 2011-07-19 MED ORDER — DEXTROSE 50% IN WATER (D50W) IV SYRG
INTRAVENOUS | Status: DC | PRN
Start: 2011-07-19 — End: 2011-07-24
  Administered 2011-07-19 – 2011-07-23 (×6): via INTRAVENOUS

## 2011-07-19 MED ORDER — DEXTROSE 50% IN WATER (D50W) IV SYRG
INTRAVENOUS | Status: DC | PRN
Start: 2011-07-19 — End: 2011-07-19

## 2011-07-19 MED ORDER — SODIUM CHLORIDE 0.9 % IV
INTRAVENOUS | Status: DC
Start: 2011-07-19 — End: 2011-07-19

## 2011-07-19 MED ORDER — ALBUTEROL SULFATE HFA 90 MCG/ACTUATION AEROSOL INHALER
90 mcg/actuation | Freq: Four times a day (QID) | RESPIRATORY_TRACT | Status: DC | PRN
Start: 2011-07-19 — End: 2011-07-24

## 2011-07-19 MED ORDER — CLOPIDOGREL 75 MG TAB
75 mg | Freq: Every day | ORAL | Status: DC
Start: 2011-07-19 — End: 2011-07-24
  Administered 2011-07-19 – 2011-07-24 (×6): via ORAL

## 2011-07-19 MED ORDER — BUMETANIDE 0.25 MG/ML IJ SOLN
0.25 mg/mL | Freq: Once | INTRAMUSCULAR | Status: AC
Start: 2011-07-19 — End: 2011-07-19
  Administered 2011-07-19: 23:00:00 via INTRAVENOUS

## 2011-07-19 MED ORDER — LEVOTHYROXINE 150 MCG TAB
150 mcg | Freq: Every day | ORAL | Status: DC
Start: 2011-07-19 — End: 2011-07-24
  Administered 2011-07-19 – 2011-07-24 (×6): via ORAL

## 2011-07-19 MED ORDER — INSULIN REGULAR HUMAN 100 UNIT/ML INJECTION
100 unit/mL | INTRAMUSCULAR | Status: DC
Start: 2011-07-19 — End: 2011-07-19
  Administered 2011-07-19 (×4): via INTRAVENOUS

## 2011-07-19 MED ORDER — SODIUM CHLORIDE 0.9 % IV
INTRAVENOUS | Status: DC
Start: 2011-07-19 — End: 2011-07-19
  Administered 2011-07-19 (×2): via INTRAVENOUS

## 2011-07-19 MED ORDER — GLUCAGON 1 MG INJECTION
1 mg | INTRAMUSCULAR | Status: DC | PRN
Start: 2011-07-19 — End: 2011-07-24

## 2011-07-19 MED ORDER — ENOXAPARIN 60 MG/0.6 ML SUB-Q SYRINGE
60 mg/0.6 mL | SUBCUTANEOUS | Status: AC
Start: 2011-07-19 — End: 2011-07-18
  Administered 2011-07-19: via SUBCUTANEOUS

## 2011-07-19 MED ORDER — INSULIN REGULAR HUMAN 100 UNIT/ML INJECTION
100 unit/mL | Freq: Three times a day (TID) | INTRAMUSCULAR | Status: DC
Start: 2011-07-19 — End: 2011-07-19
  Administered 2011-07-19: 18:00:00 via INTRAVENOUS

## 2011-07-19 MED ORDER — GLUCOSE 4 GRAM CHEWABLE TAB
4 gram | ORAL | Status: DC | PRN
Start: 2011-07-19 — End: 2011-07-24

## 2011-07-19 MED ORDER — OXYCODONE-ACETAMINOPHEN 7.5 MG-325 MG TAB
ORAL | Status: DC | PRN
Start: 2011-07-19 — End: 2011-07-24

## 2011-07-19 MED ORDER — MORPHINE 2 MG/ML INJECTION
2 mg/mL | INTRAMUSCULAR | Status: DC | PRN
Start: 2011-07-19 — End: 2011-07-24

## 2011-07-19 MED ORDER — DOCUSATE SODIUM 100 MG CAP
100 mg | Freq: Two times a day (BID) | ORAL | Status: DC
Start: 2011-07-19 — End: 2011-07-24
  Administered 2011-07-19 – 2011-07-24 (×7): via ORAL

## 2011-07-19 MED ORDER — BUMETANIDE 1 MG TAB
1 mg | Freq: Every day | ORAL | Status: DC
Start: 2011-07-19 — End: 2011-07-22
  Administered 2011-07-19 – 2011-07-22 (×4): via ORAL

## 2011-07-19 MED ORDER — INSULIN REGULAR HUMAN 100 UNIT/ML INJECTION
100 unit/mL | INTRAMUSCULAR | Status: AC
Start: 2011-07-19 — End: 2011-07-18
  Administered 2011-07-19: via INTRAVENOUS

## 2011-07-19 MED ORDER — SODIUM CHLORIDE 0.9% BOLUS IV
0.9 % | Freq: Once | INTRAVENOUS | Status: AC
Start: 2011-07-19 — End: 2011-07-18
  Administered 2011-07-19: via INTRAVENOUS

## 2011-07-19 MED ORDER — PROMETHAZINE 25 MG/ML INJECTION
25 mg/mL | Freq: Four times a day (QID) | INTRAMUSCULAR | Status: DC | PRN
Start: 2011-07-19 — End: 2011-07-18

## 2011-07-19 MED ORDER — PROCHLORPERAZINE EDISYLATE 5 MG/ML INJECTION
5 mg/mL | Freq: Three times a day (TID) | INTRAMUSCULAR | Status: DC | PRN
Start: 2011-07-19 — End: 2011-07-18

## 2011-07-19 MED ORDER — PROCHLORPERAZINE EDISYLATE 5 MG/ML INJECTION
5 mg/mL | Freq: Four times a day (QID) | INTRAMUSCULAR | Status: DC | PRN
Start: 2011-07-19 — End: 2011-07-22

## 2011-07-19 MED ORDER — FERROUS SULFATE 325 MG (65 MG ELEMENTAL IRON) TAB
325 mg (65 mg iron) | Freq: Every day | ORAL | Status: DC
Start: 2011-07-19 — End: 2011-07-20

## 2011-07-19 MED ORDER — METOPROLOL TARTRATE 50 MG TAB
50 mg | Freq: Two times a day (BID) | ORAL | Status: DC
Start: 2011-07-19 — End: 2011-07-20
  Administered 2011-07-19 – 2011-07-20 (×4): via ORAL

## 2011-07-19 MED ORDER — INSULIN LISPRO 100 UNIT/ML INJECTION
100 unit/mL | Freq: Four times a day (QID) | SUBCUTANEOUS | Status: DC
Start: 2011-07-19 — End: 2011-07-21
  Administered 2011-07-19 – 2011-07-21 (×9): via SUBCUTANEOUS

## 2011-07-19 MED ORDER — NITROGLYCERIN 2 % TRANSDERMAL OINTMENT
2 % | Freq: Two times a day (BID) | TRANSDERMAL | Status: DC
Start: 2011-07-19 — End: 2011-07-18
  Administered 2011-07-19 (×2): via TOPICAL

## 2011-07-19 MED ORDER — SIMVASTATIN 20 MG TAB
20 mg | Freq: Every evening | ORAL | Status: DC
Start: 2011-07-19 — End: 2011-07-20
  Administered 2011-07-19 – 2011-07-20 (×2): via ORAL

## 2011-07-19 MED ORDER — ASPIRIN 325 MG TAB
325 mg | ORAL | Status: AC
Start: 2011-07-19 — End: 2011-07-18
  Administered 2011-07-19: via ORAL

## 2011-07-19 MED ORDER — ACETAMINOPHEN 325 MG TABLET
325 mg | ORAL | Status: DC | PRN
Start: 2011-07-19 — End: 2011-07-24

## 2011-07-19 MED FILL — LOVENOX 60 MG/0.6 ML SUBCUTANEOUS SYRINGE: 60 mg/0.6 mL | SUBCUTANEOUS | Qty: 1.2

## 2011-07-19 MED FILL — MISOPROSTOL 200 MCG TAB: 200 mcg | ORAL | Qty: 1

## 2011-07-19 MED FILL — BUMETANIDE 1 MG TAB: 1 mg | ORAL | Qty: 2

## 2011-07-19 MED FILL — NITRO-BID 2 % TRANSDERMAL OINTMENT: 2 % | TRANSDERMAL | Qty: 30

## 2011-07-19 MED FILL — PROCRIT 10,000 UNIT/ML INJECTION SOLUTION: 10000 unit/mL | INTRAMUSCULAR | Qty: 1

## 2011-07-19 MED FILL — HEPARIN (PORCINE) IN D5W 25,000 UNIT/250 ML IV: 25000 unit/250 mL(100 unit/mL) | INTRAVENOUS | Qty: 250

## 2011-07-19 MED FILL — METOPROLOL TARTRATE 50 MG TAB: 50 mg | ORAL | Qty: 1

## 2011-07-19 MED FILL — SODIUM CHLORIDE 0.9 % IV: INTRAVENOUS | Qty: 1000

## 2011-07-19 MED FILL — ASPIRIN 81 MG TAB, DELAYED RELEASE: 81 mg | ORAL | Qty: 1

## 2011-07-19 MED FILL — PANTOPRAZOLE 40 MG TAB, DELAYED RELEASE: 40 mg | ORAL | Qty: 1

## 2011-07-19 MED FILL — FISH OIL 340 MG-1,000 MG CAPSULE: 340-1000 mg | ORAL | Qty: 1

## 2011-07-19 MED FILL — TERAZOSIN 5 MG CAP: 5 mg | ORAL | Qty: 2

## 2011-07-19 MED FILL — DEXTROSE 50% IN WATER (D50W) IV SYRG: INTRAVENOUS | Qty: 50

## 2011-07-19 MED FILL — LISINOPRIL 20 MG TAB: 20 mg | ORAL | Qty: 1

## 2011-07-19 MED FILL — INSULIN REGULAR HUMAN 100 UNIT/ML INJECTION: 100 unit/mL | INTRAMUSCULAR | Qty: 1

## 2011-07-19 MED FILL — SODIUM CHLORIDE 0.9 % IV: INTRAVENOUS | Qty: 250

## 2011-07-19 MED FILL — METOPROLOL TARTRATE 5 MG/5 ML IV SOLN: 5 mg/ mL | INTRAVENOUS | Qty: 5

## 2011-07-19 MED FILL — PROTONIX 40 MG INTRAVENOUS SOLUTION: 40 mg | INTRAVENOUS | Qty: 40

## 2011-07-19 MED FILL — ASPIRIN 325 MG TAB: 325 mg | ORAL | Qty: 1

## 2011-07-19 MED FILL — INSULIN LISPRO 100 UNIT/ML INJECTION: 100 unit/mL | SUBCUTANEOUS | Qty: 1

## 2011-07-19 MED FILL — DOK 100 MG CAPSULE: 100 mg | ORAL | Qty: 1

## 2011-07-19 MED FILL — SIMVASTATIN 20 MG TAB: 20 mg | ORAL | Qty: 1

## 2011-07-19 MED FILL — K-TAB 10 MEQ TABLET,EXTENDED RELEASE: 10 mEq | ORAL | Qty: 1

## 2011-07-19 MED FILL — LEVOTHYROXINE 150 MCG TAB: 150 mcg | ORAL | Qty: 1

## 2011-07-19 MED FILL — PLAVIX 75 MG TABLET: 75 mg | ORAL | Qty: 1

## 2011-07-19 MED FILL — BUMETANIDE 0.25 MG/ML IJ SOLN: 0.25 mg/mL | INTRAMUSCULAR | Qty: 4

## 2011-07-19 NOTE — Progress Notes (Signed)
Spiritual Care Assessment/Progress Notes    KARAM DUNSON [id number]  [id number]    [birth year]  71 y.o.  male    Patient Telephone Number: [phone] (home)   Religious Affiliation: Non denominational   Language: English   Extended Emergency Contact Information  Primary Emergency Contact: W,[name]  Home Phone: [phone]  Relation: Child   Patient Active Problem List   Diagnoses Date Noted   ??? CKD (chronic kidney disease) stage 3, GFR 30-59 ml/min 07/18/2011   ??? Foot ulcer 07/18/2011   ??? PAD (peripheral artery disease) 05/18/2011   ??? Ischemic toe ulcer 05/18/2011   ??? NSTEMI (non-ST elevated myocardial infarction) 05/16/2011   ??? DKA, type 2, not at goal 05/16/2011   ??? Unspecified hypothyroidism 05/16/2011   ??? HTN (hypertension) 10/13/2009   ??? Hyperlipidemia 10/06/2009   ??? Anemia, unspecified 09/29/2009   ??? Back pain 09/29/2009   ??? CAD (coronary artery disease)         Date: 07/19/2011       Level of Religious/Spiritual Activity:  []          Involved in faith tradition/spiritual practice    [x]          Not involved in faith tradition/spiritual practice  []          Spiritually oriented    [x]          Claims no spiritual orientation    []          seeking spiritual identity  []          Feels alienated from religious practice/tradition  []          Feels angry about religious practice/tradition  []          Spirituality/religious tradition  not a resource for coping at this time.  []          Not able to assess due to medical condition    Services Provided Today:  []          crisis intervention    []          reading Scriptures  [x]          spiritual assessment    []          prayer  [x]          empathic listening/emotional support  []          rites and rituals (cite in comments)  []          life review     []          religious support  []          theological development   []          advocacy  []          ethical dialog     []          blessing  []          bereavement support    []          support to family  []           anticipatory grief support   []          help with AMD  []          spiritual guidance    []          meditation      Spiritual Care Needs  [x]          Emotional Support  []          Spiritual/Religious Care  []   Loss/Adjustment  []          Advocacy/Referral /Ethics  []          No needs expressed at this time  []          Other: (note in comments)  Spiritual Care Plan  []          Follow up visits with pt/family  []          Provide materials  []          Schedule sacraments  []          Contact [facility]  []          Follow up as needed  []          Other: (note in comments)     Comments: Visited with pt, and provided emotional support and info about spiritual care services. .  Visited by Lysbeth Penner, Chaplain PRN  Page [phone]

## 2011-07-19 NOTE — Progress Notes (Signed)
Tharon Aquas, MD. [facility]  [address]  [place], [place] [address]  Ph [phone];   Fax [phone]      Patient: Travis [name]  DOB: 1940-09-16      Today's Date: 07/19/2011        CARDIOLOGY PROGRESS NOTE  S: No chest pain - feels better today (felt "sick" yesterday.    O:Physical Exam:  BP 141/62   Pulse 79   Temp 99 ??F (37.2 ??C)   Resp 22   Ht 6' (1.829 m)   Wt 185 lb (83.915 kg)   BMI 25.09 kg/m2   SpO2 99%  Patient appears generally well, mood and affect are appropriate and pleasant.  HEENT:  Normocephalic, atraumatic.   Neck Exam: Supple   Lung Exam: Clear to auscultation, even breath sounds.   Cardiac Exam: Regular rate and rhythm with no murmur  Abdomen: Soft, non-tender, non-distended.  Extremities: mild left leg edema.      ROS - breathing stable, no abd complaints, foot pain better recently (able to walk more), no obvious blood loss from stool per patient      Current Facility-Administered Medications   Medication Dose Route Frequency   ??? epoetin alfa (EPOGEN;PROCRIT) injection 10,000 Units  10,000 Units SubCUTAneous Q7D   ??? insulin lispro (HUMALOG) injection   SubCUTAneous AC&HS   ??? bumetanide (BUMEX) injection 1 mg  1 mg IntraVENous ONCE   ??? pantoprazole (PROTONIX) injection 40 mg  40 mg IntraVENous Q12H   ??? 0.9% sodium chloride infusion  25 mL/hr IntraVENous CONTINUOUS   ??? DISCONTD: 0.9% sodium chloride infusion  50 mL/hr IntraVENous CONTINUOUS   ??? DISCONTD: dextrose (D50W) injection Syrg 12.5-25 g  12.5-25 g IntraVENous PRN   ??? sodium chloride 0.9 % bolus infusion 250 mL  250 mL IntraVENous ONCE   ??? insulin regular (NOVOLIN, HUMULIN) injection 10 Units  10 Units IntraVENous NOW   ??? aspirin (ASPIRIN) tablet 325 mg  325 mg Oral NOW   ??? enoxaparin (LOVENOX) injection 80 mg  1 mg/kg SubCUTAneous NOW   ??? metoprolol (LOPRESSOR) injection 5 mg  5 mg IntraVENous NOW   ??? glucose chewable tablet 16 g  4 Tab Oral PRN   ??? dextrose (D50W) injection Syrg 12.5-25 g  12.5-25 g  IntraVENous PRN   ??? glucagon (GLUCAGEN) injection 1 mg  1 mg IntraMUSCular PRN   ??? aspirin delayed-release tablet 81 mg  81 mg Oral DAILY   ??? levothyroxine (SYNTHROID) tablet 150 mcg  150 mcg Oral ACB   ??? terazosin (HYTRIN) capsule 10 mg  10 mg Oral QHS   ??? metoprolol (LOPRESSOR) tablet 50 mg  50 mg Oral BID   ??? lisinopril (PRINIVIL, ZESTRIL) tablet 20 mg  20 mg Oral QHS   ??? simvastatin (ZOCOR) tablet 20 mg  20 mg Oral QHS   ??? misoprostol (CYTOTEC) tablet 200 mcg  200 mcg Oral BID   ??? bumetanide (BUMEX) tablet 2 mg  2 mg Oral DAILY   ??? albuterol (PROVENTIL HFA, VENTOLIN HFA) inhaler 2 Puff  2 Puff Inhalation Q6H PRN   ??? clopidogrel (PLAVIX) tablet 75 mg  75 mg Oral DAILY   ??? nitroglycerin (NITROBID) 2 % ointment 1 Inch  1 Inch Topical Q6H   ??? heparin 25,000 units in D5W 250 ml infusion  12-25 Units/kg/hr IntraVENous TITRATE   ??? oxyCODONE-acetaminophen (PERCOCET 7.5) 7.5-325 mg per tablet 1 Tab  1 Tab Oral Q4H PRN   ??? morphine injection 2 mg  2 mg IntraVENous Q5MIN PRN   ??? zolpidem (AMBIEN) tablet 5 mg  5 mg Oral QHS PRN   ??? acetaminophen (TYLENOL) tablet 650 mg  650 mg Oral Q4H PRN   ??? docusate sodium (COLACE) capsule 100 mg  100 mg Oral BID   ??? bisacodyl (DULCOLAX) suppository 10 mg  10 mg Rectal DAILY PRN   ??? fish oil-omega-3 fatty acids 340-1,000 mg capsule 1 Cap  1 Cap Oral BID   ??? potassium chloride SR (KLOR-CON 10) tablet 10 mEq  10 mEq Oral DAILY   ??? prochlorperazine (COMPAZINE) injection 5 mg  5 mg IntraVENous Q6H PRN   ??? DISCONTD: insulin regular (NOVOLIN, HUMULIN) injection 10 Units  10 Units SubCUTAneous NOW   ??? DISCONTD: nitroglycerin (NITROBID) 2 % ointment 1 Inch  1 Inch Topical BID   ??? DISCONTD: insulin regular (NOVOLIN, HUMULIN) 100 Units in 0.9% sodium chloride 100 mL infusion  1-10 Units/hr IntraVENous TITRATE   ??? DISCONTD: insulin regular (NOVOLIN, HUMULIN) 100 Units in 0.9% sodium chloride 100 mL infusion  1-50 Units/hr IntraVENous TITRATE   ??? DISCONTD: insulin regular (NOVOLIN, HUMULIN)  injection   IntraVENous TIDPC   ??? DISCONTD: pantoprazole (PROTONIX) tablet 40 mg  40 mg Oral ACB   ??? DISCONTD: .PHARMACY TO SUBSTITUTE PER PROTOCOL       ??? DISCONTD: .PHARMACY TO SUBSTITUTE PER PROTOCOL       ??? DISCONTD: 0.9% sodium chloride infusion  100 mL/hr IntraVENous CONTINUOUS   ??? DISCONTD: heparin (porcine) injection 5,050 Units  60 Units/kg IntraVENous ONCE   ??? DISCONTD: prochlorperazine (COMPAZINE) injection 5 mg  5 mg IntraVENous Q8H PRN   ??? DISCONTD: promethazine (PHENERGAN) injection 12.5 mg  12.5 mg IntraVENous Q6H PRN         Recent Results (from the past 24 hour(s))   EKG, 12 LEAD, INITIAL    Collection Time    07/18/11  6:22 PM       Component Value Range    Ventricular Rate 97      Atrial Rate 97      P-R Interval 184      QRS Duration 122      Q-T Interval 420      QTC Calculation (Bezet) 533      Calculated P Axis 75      Calculated R Axis 89      Calculated T Axis 146      Diagnosis        Value: Normal sinus rhythm      Possible Left atrial enlargement      Nonspecific intraventricular conduction delay      Marked ST abnormality, possible lateral subendocardial injury      Inferior infarct , age undetermined      Abnormal ECG      When compared with ECG of 22-May-2011 05:26,      Sinus rhythm has replaced Electronic ventricular pacemaker      Confirmed by Dorene Grebe ([id number]) on 07/19/2011 3:08:15 PM   URINALYSIS W/ REFLEX CULTURE    Collection Time    07/18/11  6:38 PM       Component Value Range    Color YELLOW      Appearance CLEAR      Specific gravity 1.015  1.003 - 1.030      pH 5.0  5.0 - 8.0      Protein TRACE (*) NEGATIVE MG/DL    Glucose 981 (*) NEGATIVE MG/DL    Ketone 15 (*) NEGATIVE  MG/DL    Bilirubin NEGATIVE   NEGATIVE    Blood TRACE (*) NEGATIVE    Urobilinogen 0.2  0.2 - 1.0 EU/DL    Nitrites NEGATIVE   NEGATIVE    Leukocyte Esterase NEGATIVE   NEGATIVE    WBC 0-4  0 - 4 /HPF    RBC 0-3  0 - 5 /HPF    Epithelial cells 0-5  0 - 5 /LPF    Bacteria NEGATIVE   NEGATIVE /HPF     UA:UC IF INDICATED CULTURE NOT INDICATED BY UA RESULT      Hyaline Cast 0-2  0 - 2   INFLUENZA A & B AG (RAPID TEST)    Collection Time    07/18/11  6:38 PM       Component Value Range    Influenza A Antigen NEGATIVE   NEGATIVE    Influenza B Antigen NEGATIVE   NEGATIVE   CBC WITH AUTOMATED DIFF    Collection Time    07/18/11  6:40 PM       Component Value Range    WBC 7.4  4.1 - 11.1 K/uL    RBC 2.89 (*) 4.10 - 5.70 M/uL    HGB 8.3 (*) 12.1 - 17.0 g/dL    HCT 09.8 (*) 11.9 - 50.3 %    MCV 87.9  80.0 - 99.0 FL    MCH 28.7  26.0 - 34.0 PG    MCHC 32.7  30.0 - 36.5 g/dL    RDW 14.7 (*) 82.9 - 14.5 %    PLATELET 183  150 - 400 K/uL    NEUTROPHILS 85 (*) 32 - 75 %    LYMPHOCYTES 8 (*) 12 - 49 %    MONOCYTES 7  5 - 13 %    EOSINOPHILS 0  0 - 7 %    BASOPHILS 0  0 - 1 %    ABS. NEUTROPHILS 6.3  1.8 - 8.0 K/UL    ABS. LYMPHOCYTES 0.6 (*) 0.8 - 3.5 K/UL    ABS. MONOCYTES 0.5  0.0 - 1.0 K/UL    ABS. EOSINOPHILS 0.0  0.0 - 0.4 K/UL    ABS. BASOPHILS 0.0  0.0 - 0.1 K/UL    DF SMEAR SCANNED      RBC COMMENTS 1+ ANISOCYTOSIS     HEPATIC FUNCTION PANEL    Collection Time    07/18/11  6:40 PM       Component Value Range    Protein, total 8.4 (*) 6.4 - 8.2 g/dL    Albumin 2.8 (*) 3.5 - 5.0 g/dL    Globulin 5.6 (*) 2.0 - 4.0 g/dL    A-G Ratio 0.5 (*) 1.1 - 2.2      Bilirubin, total 0.7  0.2 - 1.0 MG/DL    Bilirubin, direct 0.2  0.0 - 0.2 MG/DL    Alk. phosphatase 88  50 - 136 U/L    AST 28  15 - 37 U/L    ALT 16  12 - 78 U/L   PRO-BNP    Collection Time    07/18/11  6:45 PM       Component Value Range    NT pro-BNP 6191 (*) 0 - 125 PG/ML   TROPONIN I    Collection Time    07/18/11  6:45 PM       Component Value Range    Troponin-I, Qt. 4.77 (*) <0.05 ng/mL   CK W/ CKMB & INDEX    Collection Time  07/18/11  6:45 PM       Component Value Range    CK - MB 20.6 (*) 0.5 - 3.6 NG/ML    CK-MB Index 9.8 (*) 0 - 2.5      CK 211  39 - 308 U/L   POC CHEM8    Collection Time    07/18/11  6:52 PM       Component Value Range    Calcium, ionized (POC)  1.05 (*) 1.12 - 1.32 MMOL/L    Sodium (POC) 136  136 - 145 MMOL/L    Potassium (POC) 4.2  3.5 - 5.1 MMOL/L    Chloride (POC) 101  98 - 107 MMOL/L    CO2 (POC) 19 (*) 21 - 32 MMOL/L    Anion gap (POC) 21 (*) 5 - 15 mmol/L    Glucose (POC) 439 (*) 75 - 110 MG/DL    BUN (POC) 34 (*) 9 - 20 MG/DL    Creatinine (POC) 1.9 (*) 0.6 - 1.3 MG/DL    GFR-AA (POC) 43 (*) >60 ml/min/1.16m2    GFR, non-AA (POC) 35 (*) >60 ml/min/1.48m2    Hemoglobin (POC) 8.8 (*) 12.1 - 17.0 GM/DL    Hematocrit (POC) 26 (*) 36.6 - 50.3 %    Comment Comment Not Indicated.     POC TROPONIN-I    Collection Time    07/18/11  6:57 PM       Component Value Range    Troponin-I (POC) 2.42 (*) 0.00 - 0.08 ng/mL   GLUCOSE, POC    Collection Time    07/18/11  6:59 PM       Component Value Range    POC GLUCOSE 471 (*) 75 - 110 mg/dL    Performed by Merrily Brittle     GLUCOSE, POC    Collection Time    07/18/11  7:59 PM       Component Value Range    POC GLUCOSE 346 (*) 75 - 110 mg/dL    Performed by Bradly Bienenstock     GLUCOSE, POC    Collection Time    07/18/11  8:57 PM       Component Value Range    POC GLUCOSE 311 (*) 75 - 110 mg/dL    Performed by Bradly Bienenstock     PTT    Collection Time    07/18/11  9:40 PM       Component Value Range    aPTT 35.3 (*) 24.0 - 31.5 sec    aPTT, therapeutic range      58.0 - 77.0 SECS   GLUCOSE, POC    Collection Time    07/18/11 10:16 PM       Component Value Range    POC GLUCOSE 222 (*) 75 - 110 mg/dL    Performed by HENDERSON AMY     GLUCOSE, POC    Collection Time    07/18/11 11:20 PM       Component Value Range    POC GLUCOSE 109  75 - 110 mg/dL    Performed by Kendal Hymen     GLUCOSE, POC    Collection Time    07/19/11 12:24 AM       Component Value Range    POC GLUCOSE 49 (*) 75 - 110 mg/dL    Performed by Kendal Hymen     GLUCOSE, POC    Collection Time    07/19/11 12:42 AM       Component Value Range  POC GLUCOSE 80  75 - 110 mg/dL    Performed by Kendal Hymen     CK    Collection Time    07/19/11 12:45 AM        Component Value Range    CK 464 (*) 39 - 308 U/L   TROPONIN I    Collection Time    07/19/11 12:45 AM       Component Value Range    Troponin-I, Qt. 25.38 (*) <0.05 ng/mL   GLUCOSE, POC    Collection Time    07/19/11  1:01 AM       Component Value Range    POC GLUCOSE 95  75 - 110 mg/dL    Performed by Willaim Rayas (Moutous) Ravy     GLUCOSE, POC    Collection Time    07/19/11  2:03 AM       Component Value Range    POC GLUCOSE 48 (*) 75 - 110 mg/dL    Performed by Kendal Hymen     GLUCOSE, POC    Collection Time    07/19/11  2:05 AM       Component Value Range    POC GLUCOSE 48 (*) 75 - 110 mg/dL    Performed by Kendal Hymen     GLUCOSE, POC    Collection Time    07/19/11  2:21 AM       Component Value Range    POC GLUCOSE 120 (*) 75 - 110 mg/dL    Performed by Lanh (Moutous) Ravy     CK    Collection Time    07/19/11  3:30 AM       Component Value Range    CK 436 (*) 39 - 308 U/L   TROPONIN I    Collection Time    07/19/11  3:30 AM       Component Value Range    Troponin-I, Qt. 26.85 (*) <0.05 ng/mL   CBC W/O DIFF    Collection Time    07/19/11  3:30 AM       Component Value Range    WBC 5.6  4.1 - 11.1 K/uL    RBC 2.42 (*) 4.10 - 5.70 M/uL    HGB 7.1 (*) 12.1 - 17.0 g/dL    HCT 16.1 (*) 09.6 - 50.3 %    MCV 87.6  80.0 - 99.0 FL    MCH 29.3  26.0 - 34.0 PG    MCHC 33.5  30.0 - 36.5 g/dL    RDW 04.5 (*) 40.9 - 14.5 %    PLATELET 186  150 - 400 K/uL   MAGNESIUM    Collection Time    07/19/11  3:30 AM       Component Value Range    Magnesium 1.9  1.6 - 2.4 MG/DL   PHOSPHORUS    Collection Time    07/19/11  3:30 AM       Component Value Range    Phosphorus 3.9  2.5 - 4.9 MG/DL   METABOLIC PANEL, BASIC    Collection Time    07/19/11  3:30 AM       Component Value Range    Sodium 140  136 - 145 MMOL/L    Potassium 3.7  3.5 - 5.1 MMOL/L    Chloride 103  97 - 108 MMOL/L    CO2 31  21 - 32 MMOL/L    Anion gap 6  5 - 15 mmol/L    Glucose 61 (*)  65 - 100 MG/DL    BUN 35 (*) 6 - 20 MG/DL    Creatinine 1.61 (*) 0.45 - 1.15 MG/DL     BUN/Creatinine ratio 19  12 - 20      GFR est AA 45 (*) >60 ml/min/1.1m2    GFR est non-AA 37 (*) >60 ml/min/1.67m2    Calcium 8.1 (*) 8.5 - 10.1 MG/DL   PTT    Collection Time    07/19/11  3:30 AM       Component Value Range    aPTT 104.1 (*) 24.0 - 31.5 sec    aPTT, therapeutic range      58.0 - 77.0 SECS   GLUCOSE, POC    Collection Time    07/19/11  3:30 AM       Component Value Range    POC GLUCOSE 157 (*) 75 - 110 mg/dL    Performed by Lanh (Moutous) Ravy     GLUCOSE, POC    Collection Time    07/19/11  4:33 AM       Component Value Range    POC GLUCOSE 42 (*) 75 - 110 mg/dL    Performed by Kendal Hymen     GLUCOSE, POC    Collection Time    07/19/11  4:52 AM       Component Value Range    POC GLUCOSE 104  75 - 110 mg/dL    Performed by Kendal Hymen     GLUCOSE, POC    Collection Time    07/19/11  5:49 AM       Component Value Range    POC GLUCOSE 83  75 - 110 mg/dL    Performed by Lanh (Moutous) Ravy     TYPE & CROSSMATCH    Collection Time    07/19/11  6:50 AM       Component Value Range    Crossmatch Expiration 07/22/2011      ABO/Rh(D) O POS      Antibody screen NEG      Unit number [id number]      Blood component type RC LR AS5      Unit division 00      Status of unit ISSUED      Crossmatch result COMPATIBLE      Unit number [id number]      Blood component type RC LR AS5      Unit division 00      Status of unit ISSUED      Crossmatch result COMPATIBLE     GLUCOSE, POC    Collection Time    07/19/11  6:59 AM       Component Value Range    POC GLUCOSE 98  75 - 110 mg/dL    Performed by Lanh (Moutous) Ravy     GLUCOSE, POC    Collection Time    07/19/11  9:02 AM       Component Value Range    POC GLUCOSE 89  75 - 110 mg/dL    Performed by Kermit Balo     EKG, 12 LEAD, INITIAL    Collection Time    07/19/11  9:27 AM       Component Value Range    Ventricular Rate 82      Atrial Rate 82      P-R Interval 190      QRS Duration 126      Q-T Interval 442      QTC Calculation (Bezet)  516      Calculated P Axis  39      Calculated R Axis 81      Calculated T Axis -119      Diagnosis        Value: Sinus rhythm with occasional premature ventricular complexes      Nonspecific intraventricular block      Possible Inferior infarct , age undetermined      STT changes lateral leads      Abnormal ECG      When compared with ECG of 18-Jul-2011 18:22,      STT changes lateral leads less evident      Confirmed by Dorene Grebe ([id number]) on 07/19/2011 3:34:40 PM   CK    Collection Time    07/19/11 10:30 AM       Component Value Range    CK 448 (*) 39 - 308 U/L   TROPONIN I    Collection Time    07/19/11 10:30 AM       Component Value Range    Troponin-I, Qt. 17.72 (*) <0.05 ng/mL   PTT    Collection Time    07/19/11 10:30 AM       Component Value Range    aPTT 67.1 (*) 24.0 - 31.5 sec    aPTT, therapeutic range      58.0 - 77.0 SECS   OCCULT BLOOD, STOOL    Collection Time    07/19/11 10:30 AM       Component Value Range    Occult blood, stool POSITIVE (*) NEGATIVE   GLUCOSE, POC    Collection Time    07/19/11 10:47 AM       Component Value Range    POC GLUCOSE 86  75 - 110 mg/dL    Performed by Kermit Balo         Lab Results   Component Value Date/Time    Cholesterol, total 123 05/17/2011  2:22 AM    HDL Cholesterol 55 05/17/2011  2:22 AM    LDL, calculated 58.4 05/17/2011  2:22 AM    VLDL, calculated 9.6 05/17/2011  2:22 AM    Triglyceride 48 05/17/2011  2:22 AM    CHOL/HDL Ratio 2.2 05/17/2011  6:21 AM     Cardiac Evaluation Includes:   1) CATH: 2003 and 2006 - chronic LCX occlusion  2) STRESS: 02/09/07. The type of test was a dobutamine stress perfusion imaging. Cardiac stress testing was negative for chest pain and myocardial ischemia. Myocardial perfusion imaging showed reversible defect(s) involving the inferior wall. Gated EF (%): 55.   3) ECHO: 01/19/07. Echocardiogram demonstrated normal LV wall motion and ejection fraction, LVH and left atrial enlargement.   4) CAROTID: 2009 bilateral 50-79% stenosis.   5) CAROTIDS: 07/08/08 -  50-79% stenosis bilat  6) ECHO: 04/03/09 - TDS, moderate LVH, LVEF 65%; moderate LAE, RAE, mild AS (mean gradient 10), mild MR  7) ECHO: 1/11 - mild LVH, LAE, EF 55%, mild Pulm HTN, mild MR  8) CATH:06/05/09- DES to RCA  9) CATH: 06/22/09 - LAD 50%, LCX 100%, mid RCA patent stent, distal RCA 40%  10) ECHO: 06/22/09 - mild LVH, EF 55-60%, mild LAE, mild MR/TR, PASP 60  11) CATH: 07/18/09 - patent stent and stable findings; EF 60%  12) CATH: 10/16/09 - 85% ulcerated plaque mid RCA successfully stented with 2.75 Xience. CTO Cx successful PTCA and DES of proximal part with 2.25 Atom.   13) Echo - 05/17/11 - LVEF 450-50%,  akinesis of basal inferoseptal, basal-mid inferior, and inferolateral walls.   14) CATH: 05/20/11: d LM: 30 %. pLAD 30%. dLAD 80%. mLCX 1005 prior to stent. OM1:80%. mRCA:100 % at the site of a prior stent.- Med tx  15) Echo 07/19/11 -LVEF 45%, There was moderate hypokinesis of the basal-midinferoseptal and basal-mid inferior wall(s). Mild LAE      Assessment and Plan:  1) NSTEMI / CAD   - Mr. Sawa ruled in for a NSTEMI with peak trop 27   - He had significant ST depression in the lateral leads on arrival.    - This is the second MI Mr. Weathington has had in the past couple of months (last one 05/17/11).  Cath then showed multivessel CAD including a tight proximal LCX/OM stenosis.  I suspect this may be the ischemic territory involved now.      - Tentatively have set up Mr. Gibbons for a repeat cardiac cath for Monday.  Will assess any new lesions which may be easily amenable to PCI.  If there are no new lesions, Dr. Elmon Kirschner will determine if it would be reasonable to PCI one of the chronic lesions.  If Mr. Mckeithan needs a stent, would prefer a BMS stent due to his problems with GI bleeding. Will also have to minimize dye use given his CKD.  If lesions not amenable to PCI, then continue with medical management.    - continue heparin IV for now.   Also on ASA, plavix, statin, NTP, and beta-blocker.       2) Anemia  - Hgb 7.1 today   - stool is hemoccult positive   - Mr. 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    - Would recommend trying to keep the Hgb in 9-10 range.    - he was transfused 1 unit PRBC today   - Will ask GI to see for any further input (Dr. Monika Salk has seen him before)    - Continue Procrit and iron replacement going forward   - For now will continue plavix (especially if he will get a stent) - may have to accept a small, slow GI bleed temporarily on Plavix for a few weeks.  If all we do is medical management going forward, then may stop the plavix in order to avoid anemia and MI's from supply-demand mismatch.     3) CKD - Cr near his baseline - will need to hydrate pre-cath    4) PVD - followed by Dr. Suszanne Finch - has had recent PTCA's, needs amputation of toe per patient     Tharon Aquas, MD, [facility]

## 2011-07-19 NOTE — Progress Notes (Signed)
07/19/2011 02:29P CM met with pt to discuss home disposition.Pt lives alone in a two story home in [place].His daughter lives in [place].Pt has  home O2 which is bled through his CPaP.[employer] services his O2 /CPa[employer] supplies.Pt states he is only on O2 @ night.Pt is open to [facility].Orders received to resume wound care,CHF management,telemonitoring,PT,and diabetic management on discharge.[facility] notified regarding pt.'s hospitalization.Nursing,if pt is discharged from the hospital over the weekend,pleas call [facility] @ [phone] to notify them regarding pt.'s discharge and please fax the AVS to [facility] 2 [phone].Thank you.Marliss Czar

## 2011-07-19 NOTE — Progress Notes (Addendum)
Assumed care of pt. Resting comfortably. No c/o pain.    1100 - First unit of RPCs begun. No adverse reaction. VSS. Dressing changed left foot. Great and second toes necrotic. Wound care consulted.    1400 - First unit completed. Call to Dr. Doren Custard. Pt's lung sounds are more course after first unit. Stool specimen sent earlier positive for blood. Reported to MD. GI consulted. Orders received and carried out.    1420 - Second unit begun.    1800 - Second unit blood completed. Ptt ddrawn and sent to lab.

## 2011-07-19 NOTE — Consults (Signed)
[facility] Progress Note  [facility]   [address] [place], [place] [address]   [phone]      Assessment & Plan:   1.  CKD 3:Cr runs between 1.7 to 2.1 mg%  - cr is at baseline  - labs ordred for am  2. Anemia  - Iron low out pt  -was supposed to get IV Iron  - will  Get enough iron with blood  - order epo  - transfuse 2 units  3. NSTEMI:Severe CAD  - DW him about contrast nephropathy  -IVF if contrast planned  - HE DOES NOT WANT HD in any circumstances  4.DM  5.HTN:OK this am  6. CAD  6.PAD       Subjective:   CC:f/u CKD,HTN  HPI: Patient seen AND examined.  71 yrs old AAM who sees Dr.Glenn in CKD clinic ,last time seen in 06/17/11:cr was 2.7 mg% out pt prior to his visit and repeat cr at the time of visit was 1.7 mg%.  He has Anemia and was diagnosed with Iron def.  He gets epo.  He came here with weakness,nausea and "feeling sick".  Cr is at baseline at 1.8 mg.  He has been diagnosed with NSTEMI.  His HTN is stable.  Patient denies any Chest pain/palpitations.  Patient denies any shortness of breath.  Cough present.  Patient denies any Fever,chills.  Patient denies any problem passing urine,Blood in the urine.  Patient denies any recent out pt antibiotics use.  Denies any contrast procedures.    ROS:10 system neg except in HPI  PMH/PAS:no changed from clinic visit  Current Facility-Administered Medications   Medication Dose Route Frequency   ??? sodium chloride 0.9 % bolus infusion 250 mL  250 mL IntraVENous ONCE   ??? insulin regular (NOVOLIN, HUMULIN) injection 10 Units  10 Units IntraVENous NOW   ??? aspirin (ASPIRIN) tablet 325 mg  325 mg Oral NOW   ??? enoxaparin (LOVENOX) injection 80 mg  1 mg/kg SubCUTAneous NOW   ??? metoprolol (LOPRESSOR) injection 5 mg  5 mg IntraVENous NOW   ??? insulin regular (NOVOLIN, HUMULIN) 100 Units in 0.9% sodium chloride 100 mL infusion  1-50 Units/hr IntraVENous TITRATE   ??? insulin regular (NOVOLIN, HUMULIN)  injection   IntraVENous TIDPC   ??? glucose chewable tablet 16 g  4 Tab Oral PRN   ??? dextrose (D50W) injection Syrg 12.5-25 g  12.5-25 g IntraVENous PRN   ??? glucagon (GLUCAGEN) injection 1 mg  1 mg IntraMUSCular PRN   ??? pantoprazole (PROTONIX) tablet 40 mg  40 mg Oral ACB   ??? aspirin delayed-release tablet 81 mg  81 mg Oral DAILY   ??? levothyroxine (SYNTHROID) tablet 150 mcg  150 mcg Oral ACB   ??? terazosin (HYTRIN) capsule 10 mg  10 mg Oral QHS   ??? metoprolol (LOPRESSOR) tablet 50 mg  50 mg Oral BID   ??? lisinopril (PRINIVIL, ZESTRIL) tablet 20 mg  20 mg Oral QHS   ??? simvastatin (ZOCOR) tablet 20 mg  20 mg Oral QHS   ??? misoprostol (CYTOTEC) tablet 200 mcg  200 mcg Oral BID   ??? bumetanide (BUMEX) tablet 2 mg  2 mg Oral DAILY   ??? albuterol (PROVENTIL HFA, VENTOLIN HFA) inhaler 2 Puff  2 Puff Inhalation Q6H PRN   ??? clopidogrel (PLAVIX) tablet 75 mg  75 mg Oral DAILY   ??? 0.9% sodium chloride infusion  100 mL/hr IntraVENous CONTINUOUS   ???  nitroglycerin (NITROBID) 2 % ointment 1 Inch  1 Inch Topical Q6H   ??? heparin 25,000 units in D5W 250 ml infusion  12-25 Units/kg/hr IntraVENous TITRATE   ??? oxyCODONE-acetaminophen (PERCOCET 7.5) 7.5-325 mg per tablet 1 Tab  1 Tab Oral Q4H PRN   ??? morphine injection 2 mg  2 mg IntraVENous Q5MIN PRN   ??? zolpidem (AMBIEN) tablet 5 mg  5 mg Oral QHS PRN   ??? acetaminophen (TYLENOL) tablet 650 mg  650 mg Oral Q4H PRN   ??? docusate sodium (COLACE) capsule 100 mg  100 mg Oral BID   ??? bisacodyl (DULCOLAX) suppository 10 mg  10 mg Rectal DAILY PRN   ??? fish oil-omega-3 fatty acids 340-1,000 mg capsule 1 Cap  1 Cap Oral BID   ??? potassium chloride SR (KLOR-CON 10) tablet 10 mEq  10 mEq Oral DAILY   ??? prochlorperazine (COMPAZINE) injection 5 mg  5 mg IntraVENous Q6H PRN          Objective:     Vitals:  Blood pressure 125/60, pulse 72, temperature 97.9 ??F (36.6 ??C), resp. rate 20, height 6' (1.829 m), weight 185 lb (83.915 kg), SpO2 99.00%.  Temp (24hrs), Avg:99 ??F (37.2 ??C), Min:97.9 ??F (36.6 ??C), Max:100  ??F (37.8 ??C)      Intake and Output:     02/20 1900 - 02/22 0659  In: 860 [I.V.:860]  Out: 225 [Urine:225]    Physical Exam:                Patient is intubated:  no    Physical Examination:   GENERAL ASSESSMENT: NAD  HEENT:Nontraumatic   CHEST: CTA  HEART: S1S2  ABDOMEN: Soft,NT,  ZO:XWRUE: n  EXTREMITY: EDEMA 1   NEURO:Grossly non focal          ECG/rhythm:  sr  Data Review      Recent Labs   Basename 07/18/11 1857    ITNL 2.42*      Recent Labs   Basename 07/19/11 0330 07/19/11 0045 07/18/11 1845    CPK 436* 464* 211    CKMB -- -- --    TROIQ 26.85* 25.38* 4.77*     Recent Labs   Basename 07/19/11 0330 07/18/11 1840    NA 140 --    K 3.7 --    CL 103 --    CO2 31 --    BUN 35* --    CREA 1.80* --    GLU 61* --    PHOS 3.9 --    MG 1.9 --    ALB -- 2.8*    WBC 5.6 7.4    HGB 7.1* 8.3*    HCT 21.2* 25.4*    PLT 186 183     No results found for this basename: INR:3,PTP:3,APTT:3, in the last 72 hours  Needs: urine analysis, urine sodium, protein and creatinine  Lab Results   Component Value Date/Time    Sodium,urine random 59 05/21/2011  9:00 PM    Creatinine,urine random 32.49 05/21/2011  9:00 PM         Discussed with:  Colleague, Nursing          [facility]  [address]  [place], [place] [address]  Phone - [phone]  Fax - [phone]

## 2011-07-19 NOTE — Progress Notes (Addendum)
0024 Pt's BG 49. Insulin gtt stopped. 20 ml D50 given.   0042 BG 80. 8 ml D50 given per glucostabilizer  0057 BG 96. Insulin gtt remains stopped  0206 BG 48. 20 ml D50 given per glucostabilizer   0220 BG 120  0254 Pt's troponin 25.38. Pt resting comfortably in bed with eyes closed. No c/o of CP or other difficulties. HR 69 BP 111/53 98% on RA. Dr. Dagoberto Reef paged and made aware. No new orders received at this time.   0330 BG 157  0430 BG 42. Pt given 23 ml D50.   0445 Dr. Verlin Fester paged and made aware pt's BG continuously drops. Orders to stop insulin gtt received.   0445 BG 104  0545 BG 83   Bedside and Verbal shift change report given to Barkley Bruns, Charity fundraiser (Cabin crew) by Abran Duke, RN (offgoing nurse).  Report given with SBAR, Kardex, Intake/Output, MAR and Recent Results.

## 2011-07-19 NOTE — Progress Notes (Signed)
Spiritual Care Assessment/Progress Notes    Travis [name] [id number]  [id number]    [birth year]  71 y.o.  male    Patient Telephone Number: [phone] (home)   Religious Affiliation: Non denominational   Language: English   Extended Emergency Contact Information  Primary Emergency Contact: W,[name]  Home Phone: [phone]  Relation: Child   Patient Active Problem List   Diagnoses Date Noted   ??? CKD (chronic kidney disease) stage 3, GFR 30-59 ml/min 07/18/2011   ??? Foot ulcer 07/18/2011   ??? PAD (peripheral artery disease) 05/18/2011   ??? Ischemic toe ulcer 05/18/2011   ??? NSTEMI (non-ST elevated myocardial infarction) 05/16/2011   ??? DKA, type 2, not at goal 05/16/2011   ??? Unspecified hypothyroidism 05/16/2011   ??? HTN (hypertension) 10/13/2009   ??? Hyperlipidemia 10/06/2009   ??? Anemia, unspecified 09/29/2009   ??? Back pain 09/29/2009   ??? CAD (coronary artery disease)         Date: 07/19/2011       Level of Religious/Spiritual Activity:  []          Involved in faith tradition/spiritual practice    [x]          Not involved in faith tradition/spiritual practice  [x]          Spiritually oriented    []          Claims no spiritual orientation    []          seeking spiritual identity  []          Feels alienated from religious practice/tradition  []          Feels angry about religious practice/tradition  [x]          Spirituality/religious tradition may be a Theatre stage manager for coping at this time.  []          Not able to assess due to medical condition    Services Provided Today:  []          crisis intervention    []          reading Scriptures  [x]          spiritual assessment    []          prayer  []          empathic listening/emotional support  []          rites and rituals (cite in comments)  []          life review     []          religious support  [x]          theological development   []          advocacy  [x]          ethical dialog     []          blessing  []          bereavement support    []          support to family  []           anticipatory grief support   []          help with AMD  []          spiritual guidance    []          meditation      Spiritual Care Needs  [x]          Emotional Support  [x]          Spiritual/Religious Care  [x]   Loss/Adjustment  []          Advocacy/Referral /Ethics  []          No needs expressed at this time  []          Other: (note in comments)  Spiritual Care Plan  []          Follow up visits with pt/family  []          Provide materials  []          Schedule sacraments  []          Contact Community Clergy  []          Follow up as needed  []          Other: (note in comments)     Comments: Visited with pt, and provided emotional support and info about spiritual care services. Pt has internal spiritual conflict and welcomes more conversation regarding it. Provided support and affirmation for his struggle.  Visited by Lysbeth Penner, Chaplain PRN  Spiritual Care Pager [phone])

## 2011-07-19 NOTE — Progress Notes (Signed)
[facility]  [address] [place], [place] [address]  [phone]      Medical Progress Note      NAME: Travis [name]   DOB:  [birth year]  MRM:  [id number]    Date/Time: 07/19/2011  8:50 AM         Subjective:     Chief Complaint:  "I am fine, can I eat?"  No complaints this AM. Irritated as he wants to eat. No CP/SOB currently. Did have low BG levels overnight prompting cessation of insulin gtt. States he has felt weak for the last week and that is why he presented to the ED.       ROS:  (bold if positive, if negative)      Weakness       Objective:       Vitals:          Last 24hrs VS reviewed since prior progress note. Most recent are:    Visit Vitals   Item Reading   ??? BP 125/60   ??? Pulse 72   ??? Temp 97.9 ??F (36.6 ??C)   ??? Resp 20   ??? Ht 6' (1.829 m)   ??? Wt 185 lb   ??? BMI 25.09 kg/m2   ??? SpO2 99%     SpO2 Readings from Last 6 Encounters:   07/19/11 99%   05/25/11 97%   11/13/09 98%   11/13/09 98%   11/13/09 98%   10/17/09 100%          Intake/Output Summary (Last 24 hours) at 07/19/11 0850  Last data filed at 07/19/11 0559   Gross per 24 hour   Intake 859.96 ml   Output    225 ml   Net 634.96 ml          Exam:     Physical Exam:    Gen:  Well-developed, well-nourished, in no acute distress  HEENT:  Pink conjunctivae, PERRL, hearing intact to voice, moist mucous membranes  Neck:  Supple, without masses, thyroid non-tender  Resp:  No accessory muscle use, clear breath sounds without wheezes rales or rhonchi  Card:  No murmurs, normal S1, S2 without thrills, bruits or peripheral edema  Abd:  Soft, non-tender, non-distended, normoactive bowel sounds are present  Musc:  No cyanosis or clubbing  Skin:  No rashes or ulcers, skin turgor is good  Neuro:  Cranial nerves 3-12 are grossly intact, grip strength is 5/5 bilaterally and dorsi / plantarflexion is 5/5 bilaterally, follows commands appropriately  Psych:  Good insight, oriented to person, place and time, alert    Medications  Reviewed: (see below)    Lab Data Reviewed: (see below)    ______________________________________________________________________    Medications:     Current Facility-Administered Medications   Medication Dose Route Frequency   ??? sodium chloride 0.9 % bolus infusion 250 mL  250 mL IntraVENous ONCE   ??? insulin regular (NOVOLIN, HUMULIN) injection 10 Units  10 Units IntraVENous NOW   ??? aspirin (ASPIRIN) tablet 325 mg  325 mg Oral NOW   ??? enoxaparin (LOVENOX) injection 80 mg  1 mg/kg SubCUTAneous NOW   ??? metoprolol (LOPRESSOR) injection 5 mg  5 mg IntraVENous NOW   ??? insulin regular (NOVOLIN, HUMULIN) 100 Units in 0.9% sodium chloride 100 mL infusion  1-50 Units/hr IntraVENous TITRATE   ??? insulin regular (NOVOLIN, HUMULIN) injection   IntraVENous TIDPC   ??? glucose chewable tablet 16 g  4 Tab  Oral PRN   ??? dextrose (D50W) injection Syrg 12.5-25 g  12.5-25 g IntraVENous PRN   ??? glucagon (GLUCAGEN) injection 1 mg  1 mg IntraMUSCular PRN   ??? pantoprazole (PROTONIX) tablet 40 mg  40 mg Oral ACB   ??? aspirin delayed-release tablet 81 mg  81 mg Oral DAILY   ??? levothyroxine (SYNTHROID) tablet 150 mcg  150 mcg Oral ACB   ??? terazosin (HYTRIN) capsule 10 mg  10 mg Oral QHS   ??? metoprolol (LOPRESSOR) tablet 50 mg  50 mg Oral BID   ??? lisinopril (PRINIVIL, ZESTRIL) tablet 20 mg  20 mg Oral QHS   ??? simvastatin (ZOCOR) tablet 20 mg  20 mg Oral QHS   ??? misoprostol (CYTOTEC) tablet 200 mcg  200 mcg Oral BID   ??? bumetanide (BUMEX) tablet 2 mg  2 mg Oral DAILY   ??? albuterol (PROVENTIL HFA, VENTOLIN HFA) inhaler 2 Puff  2 Puff Inhalation Q6H PRN   ??? clopidogrel (PLAVIX) tablet 75 mg  75 mg Oral DAILY   ??? 0.9% sodium chloride infusion  100 mL/hr IntraVENous CONTINUOUS   ??? nitroglycerin (NITROBID) 2 % ointment 1 Inch  1 Inch Topical Q6H   ??? heparin 25,000 units in D5W 250 ml infusion  12-25 Units/kg/hr IntraVENous TITRATE   ??? oxyCODONE-acetaminophen (PERCOCET 7.5) 7.5-325 mg per tablet 1 Tab  1 Tab Oral Q4H PRN   ??? morphine injection 2 mg  2  mg IntraVENous Q5MIN PRN   ??? zolpidem (AMBIEN) tablet 5 mg  5 mg Oral QHS PRN   ??? acetaminophen (TYLENOL) tablet 650 mg  650 mg Oral Q4H PRN   ??? docusate sodium (COLACE) capsule 100 mg  100 mg Oral BID   ??? bisacodyl (DULCOLAX) suppository 10 mg  10 mg Rectal DAILY PRN   ??? fish oil-omega-3 fatty acids 340-1,000 mg capsule 1 Cap  1 Cap Oral BID   ??? potassium chloride SR (KLOR-CON 10) tablet 10 mEq  10 mEq Oral DAILY   ??? prochlorperazine (COMPAZINE) injection 5 mg  5 mg IntraVENous Q6H PRN            Lab Review:     Recent Labs   Basename 07/19/11 0330 07/18/11 1840    WBC 5.6 7.4    HGB 7.1* 8.3*    HCT 21.2* 25.4*    PLT 186 183     Recent Labs   Basename 07/19/11 0330 07/18/11 1840    NA 140 --    K 3.7 --    CL 103 --    CO2 31 --    GLU 61* --    BUN 35* --    CREA 1.80* --    CA 8.1* --    MG 1.9 --    PHOS 3.9 --    ALB -- 2.8*    TBIL -- 0.7    SGOT -- 28    INR -- --     Lab Results   Component Value Date/Time    POC GLUCOSE 98 07/19/2011  6:59 AM    POC GLUCOSE 83 07/19/2011  5:49 AM    POC GLUCOSE 104 07/19/2011  4:52 AM    POC GLUCOSE 42 07/19/2011  4:33 AM    POC GLUCOSE 157 07/19/2011  3:30 AM            Assessment / Plan:   NSTEMI (non-ST elevated myocardial infarction) (05/16/2011)/CAD (coronary artery disease)   - started on heparin gtt by nocturnist   -  CE uptrending; most recent was 26.85   - NPO in the event of cath this AM   - continue nitro PRN, ASA, morphine PRN and O2   - optimize heart rate control   - continue statin   - optimize BG control   - appreciate cardiology's assistance; TTE pending     Anemia, unspecified (09/29/2009)   - per daughter due for IV iron   - will transfuse 1u PRBC's this AM; in setting of cardiac disease goal hemoglobin 8.0   - check stool blood   - continue PPI     Hyperlipidemia (10/06/2009)   - continue statin and fish oil     HTN (hypertension) (10/13/2009) - optimize BP control   - continue BP meds     DKA, type 2, not at goal (05/16/2011) - s/p insulin gtt. Stopped due to  hypoglycemia. No AG currently   - hold Lantus   - NPO   - will resume home Lantus when able to eat   - ISS   - BG checks AC TID and qHS when eating     Unspecified hypothyroidism (05/16/2011)   - continue LT4 at home dose     PAD (peripheral artery disease) (05/18/2011)/Foot ulcer (07/18/2011)   - has been working with Vascular Surgery as outpatient and apparently perfusion is better per daughter   - wound care for toe ulcer     CKD (chronic kidney disease) stage 3, GFR 30-59 ml/min (07/18/2011)   - creatinine just a little above baseline   - hydrate and follow     Code Status   - patient is DO NOT RESUSCITATE    Total time spent with patient: 28 Minutes                  Care Plan discussed with: Patient and Nursing Staff    Discussed:  Code Status and Care Plan    Prophylaxis:  Heparin gtt     Disposition:  Home w/Family           ___________________________________________________    Attending Physician: Gaspar Bidding, MD

## 2011-07-19 NOTE — Other (Signed)
DTC Consult Note    Recommendations/ Comments: If appropriate, please consider starting a correction scale high sensitivity and a basal insulin once results above 120mg /dl consistently.      Pt was seen by our team 07/17/10 and reinforced sick day rules especially not to skip insulin when not feeling well.  He is very grumpy this am and not happy about the discussion.  He reports he stopped his insulin again and reports he remembers the discussion in December.  He reports he does not keep a record of his BS and just goes by how he feels whether he is low or high then eats or takes some insulin.      Pt has fair self care at home and when he gets ill chooses not to follow recommendations for sick days. He was provided a copy of what to do at last visit.        Consult received for:  [x]              Assessment of home management     []              Meter/monitoring     []              Insulin instruction     []              New diagnosis     []              Outpatient education     []              Insulin pump patient     []              Insulin infusion     []              DKA/HHS    Chart reviewed and initial evaluation complete on Travis [name].    Patient is a 71 y.o. male with known  Type 2 Diabetes on insulin injections: Lantus : 30 units at bedtime, Aspart by scale ac B/D  at home.    BG monitoring at home 2-3 times per day.  Patient reports BG levels at home trend: all over the place but mad I wanted specifics.    Assessed and instructed patient on the following: .     ??  blood sugar goals  ?? hypoglycemia prevention and treatment- can not tell me how often this happens  ?? insulin adjustments -   ?? Sick day rules-remembers we told him not to skip insulin when ill; reinforced that insulin is needed when ill due to stress of illness  ?? SMBG skills    Encouraged the following:   ?? regular blood sugar monitoring: 3-4 times daily and record the results for his endocrinologist    Provided patient with the following: []               Survival skills education materials               []              Insulin education materials               []              CHO counting education materials               []              Outpatient DTC contact number               []   Glucometer               []              Insulin start kit- vial/syringe               []              Insulin start kit- pen    Patient was able to give return demonstration of []        glucometer []        saline injection with []       no/ []        minimal assistance needed.  []        Nurse to have patient self inject prior to discharge.    A1c:   Lab Results   Component Value Date/Time    Hemoglobin A1c 10.5 05/17/2011  2:22 AM       POC Glucose last 24hrs:   Lab Results   Component Value Date/Time    POC GLUCOSE 98 07/19/2011  6:59 AM    POC GLUCOSE 83 07/19/2011  5:49 AM    POC GLUCOSE 104 07/19/2011  4:52 AM    POC GLUCOSE 42 07/19/2011  4:33 AM    POC GLUCOSE 157 07/19/2011  3:30 AM    POC GLUCOSE 120 07/19/2011  2:21 AM    POC GLUCOSE 48 07/19/2011  2:05 AM    POC GLUCOSE 48 07/19/2011  2:03 AM       Lab Results   Component Value Date/Time    Creatinine 1.80 07/19/2011  3:30 AM       Current hospital DM medication: none    Will continue to follow as needed.    Thank you.     Evlyn Kanner, RD, CDE

## 2011-07-19 NOTE — Progress Notes (Addendum)
1921 Bedside and Verbal shift change report given to Juline Patch RN (oncoming nurse) by Cyndi Bender RN (offgoing nurse).  Report given with SBAR, Intake/Output, MAR and Recent Results  2118 BG = 296. Pt up in chair  2200 gave 9 units Humalog subq and snack  0009 pt reassessed. Refuses to go to bed. Insists on sitting in chair.  0120 For PTT 44.6 gave 2000 unit bolus Heparin and increased heparin drip to 12 units/kg/hr  0605 Pt has been up in chair all night. Refuses to go to bed. Discussed skin break down but pt still refused.  5284 Blood drawn from right arm and sent to lab for PTT.  0717 Bedside and Verbal shift change report given to Morrie Sheldon RN (oncoming nurse) by Juline Patch RN (offgoing nurse).  Report given with SBAR, Intake/Output, MAR and Recent Results.

## 2011-07-19 NOTE — Progress Notes (Signed)
Bedside and Verbal shift change report given to RAndrews, RN (oncoming nurse) by KMiller, RN (offgoing nurse).  Report given with SBAR, Kardex, MAR and Recent Results.

## 2011-07-19 NOTE — Progress Notes (Signed)
Echocardiogram is completed. Final report is to follow.

## 2011-07-19 NOTE — Other (Signed)
Cardiopulmonary Rehab:  Reviewed patient chart.  Attempted to give patient handouts for MI Signs and Symptoms.  Patient states, "You gave me all that stuff last time."  Will follow as indicated.

## 2011-07-19 NOTE — Wound Image (Signed)
Wound Nurse Note    Initial consult received to see patient regarding left foot ulcerations.  Patient currently sitting in bedside recliner; alert and oriented x 3.     Assessment:  Left great toe - dry eschar distally with soft callused area on plantar surface; tender.  Scant yellow drainage noted at separation of eschar on dorsal surface.  Left toe #2 - dry eschar distally; tender.  DP/PT pulses audible by doppler; foot warm.    Recommendations:  Noted Vascular Surgery consult in place; patient seen by Delsa Grana on previous admission.  Pending further recommendations, would continue current wound care to area using Acticoat Flex + dry gauze and kling.  Patient otherwise may benefit from painting area with betadine to help further dry out eschar.    Plan:  Paged MD to review the above.  Will follow; reconsult as needed.    Judithe Modest RN CWCN (ext [phone])

## 2011-07-19 NOTE — Consults (Signed)
Nile Dear. Nunzio Cory, MD  [phone] office  [phone] voicemail   Gastroenterology Consultation Note      Admit Date: 07/18/2011  Consult Date: 07/19/2011   I greatly appreciate your asking me to see Travis [name], thank you very much for the opportunity to participate in his care.    Narrative Assessment and Plan   ?? Anemia  ?? History of AVM  ?? Last colonoscopy 2011 by Dr. Monika Salk  ?? Chronic kidney disease  ?? 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  Treatment is transfusion and iron supplementation.  Minimize or avoid anticoagulants or antiplatelet agents if possible, but the risk: benefit analysis probably best directed by his cardiologist given his recent NSTEMI.  For now would not change his current prescription.  No indication for repeat endoscopic testing at this time.    He wanted me to address his complaints of being unable to completely empty his bladder.  I suggested he seek the care of a urologist for that symptom.    Dr. Nedra Hai will see this weekend if needed, Dr. Monika Salk will resume his care on Monday.    Subjective:     Chief Complaint: Anemia    History of Present Illness: Patient is a 71 y.o. male with a history of CAD and AVM admitted on 07/18/2011 with NSTEMI (non-ST elevated myocardial infarction).  We have been asked to evaluate patient for iron deficiency anemia.  Patient denies any abdominal pain, nausea/vomiting, hematemesis, melena, hematochezia, change in bowel habits, or weight loss.  Patient denies any recent NSAIDs.  No recent upper endoscopy or colonoscopy.      PCP:  Phys Other, MD    Past Medical History   Diagnosis Date   ??? CAD (coronary artery disease)      see problem list   ??? HTN (hypertension)    ??? PVD (peripheral vascular disease)    ??? DM (diabetes mellitus)    ??? Hyperlipidemia    ??? Sleep  apnea      on CPAP   ??? COPD (chronic obstructive pulmonary disease)      mild COPD, recurrent pneumonia, chronic bronchitis (followed by Dr. Leroy Libman)   ??? CKD (chronic kidney disease)      Cr 1.7 in 10/09   ??? CHF (congestive heart failure)    ??? Anemia    ??? Pulmonary hypertension      PASP 60 on echo 06/22/09   ??? Thromboembolus      leg    ??? Thyroid disease    ??? Pneumonia    ??? GERD (gastroesophageal reflux disease)    ??? Chronic pain      back pain   ??? Cataract    ??? GI bleed      multiple bleeding episodes and blood transfusions.  Dr. Monika Salk has followed..  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   ??? Foot ulcer 07/18/2011        Past Surgical History   Procedure Date   ??? Hx orthopaedic      knee replacement   ??? Pr vascular surgery procedure unlist      arterial bypass   ??? Hx pacemaker    ??? Pr cardiac surg procedure unlist      cardiac stent   ??? Hx cataract removal      removed rt  eye       History   Substance Use Topics   ??? Smoking status: Former Smoker -- 20 years     Types: Cigarettes     Quit date: 11/09/1978   ??? Smokeless tobacco: Never Used   ??? Alcohol Use: No        Family History   Problem Relation Age of Onset   ??? Hypertension     ??? Cancer Mother      cervical and colon   ??? Cancer Father      lung        No Known Allergies         Home Medications:  Prior to Admission Medications   Medication Last Dose Informant Patient Reported? Taking?   Ferrous Sulfate (SLOW FE) 47.5 mg iron TbER tablet   Yes Yes   Take 1 Tab by mouth two (2) times a day.   clopidogrel (PLAVIX) 75 mg tablet 07/18/2011 at Unknown  No Yes   Take 1 Tab by mouth daily.   insulin glargine (LANTUS) 100 unit/mL injection 07/18/2011 at Unknown  No Yes   Use 18 units of your usual Glargine each day   albuterol (PROVENTIL HFA, VENTOLIN HFA) 90 mcg/actuation inhaler 07/11/2011 at Unknown  No Yes   Take 2 Puffs by inhalation every six (6) hours as needed for Wheezing.   potassium chloride (K-DUR, KLOR-CON) 10 mEq tablet 07/18/2011 at Unknown  No Yes   Take 1 Tab by mouth  daily.   insulin aspart (NOVOLOG) 100 unit/mL injection 07/18/2011 at Unknown  Yes Yes   by SubCUTAneous route Before breakfast and dinner.     bumetanide (BUMEX) 2 mg tablet 07/18/2011 at Unknown  Yes Yes   Take 2 mg by mouth daily.   misoprostol (CYTOTEC) 200 mcg tablet 07/18/2011 at Unknown  No Yes   Take 1 Tab by mouth two (2) times a day.   omeprazole (PRILOSEC) 20 mg capsule 07/18/2011 at Unknown  Yes Yes   Take 20 mg by mouth daily.   simvastatin (ZOCOR) 40 mg tablet 07/18/2011 at Unknown  Yes Yes   Take 20 mg by mouth nightly.   metoprolol (LOPRESSOR) 50 mg tablet 07/18/2011 at Unknown  No Yes   Take 1 Tab by mouth two (2) times a day.   lisinopril (PRINIVIL, ZESTRIL) 20 mg tablet 07/18/2011 at Unknown  No Yes   Take 1 Tab by mouth nightly.   terazosin (HYTRIN) 5 mg capsule 07/18/2011 at Unknown  Yes Yes   Take 10 mg by mouth nightly.   aspirin 81 mg tablet 07/18/2011 at Unknown  Yes Yes   Take  by mouth daily.   omega-3 fatty acids-vitamin e (FISH OIL) 1,000 mg Cap 07/18/2011 at Unknown  Yes Yes   Take  by mouth two (2) times a day.   levothyroxine (SYNTHROID) 150 mcg tablet 07/18/2011 at Unknown  Yes Yes   Take  by mouth daily (before breakfast).   nitroglycerin (NITROSTAT) 0.4 mg SL tablet 06/17/2011 at Unknown  Yes Yes   by SubLINGual route every five (5) minutes as needed.   ranitidine hcl (ZANTAC) 150 mg capsule 07/18/2011 at Unknown  Yes Yes   Take 150 mg by mouth two (2) times a day.          Hospital Medications:  Current Facility-Administered Medications   Medication Dose Route Frequency   ??? epoetin alfa (EPOGEN;PROCRIT) injection 10,000 Units  10,000 Units SubCUTAneous Q7D   ??? insulin lispro (HUMALOG) injection  SubCUTAneous AC&HS   ??? bumetanide (BUMEX) injection 1 mg  1 mg IntraVENous ONCE   ??? pantoprazole (PROTONIX) injection 40 mg  40 mg IntraVENous Q12H   ??? 0.9% sodium chloride infusion  25 mL/hr IntraVENous CONTINUOUS   ??? sodium chloride 0.9 % bolus infusion 250 mL  250 mL IntraVENous ONCE   ??? insulin  regular (NOVOLIN, HUMULIN) injection 10 Units  10 Units IntraVENous NOW   ??? aspirin (ASPIRIN) tablet 325 mg  325 mg Oral NOW   ??? enoxaparin (LOVENOX) injection 80 mg  1 mg/kg SubCUTAneous NOW   ??? metoprolol (LOPRESSOR) injection 5 mg  5 mg IntraVENous NOW   ??? glucose chewable tablet 16 g  4 Tab Oral PRN   ??? dextrose (D50W) injection Syrg 12.5-25 g  12.5-25 g IntraVENous PRN   ??? glucagon (GLUCAGEN) injection 1 mg  1 mg IntraMUSCular PRN   ??? aspirin delayed-release tablet 81 mg  81 mg Oral DAILY   ??? levothyroxine (SYNTHROID) tablet 150 mcg  150 mcg Oral ACB   ??? terazosin (HYTRIN) capsule 10 mg  10 mg Oral QHS   ??? metoprolol (LOPRESSOR) tablet 50 mg  50 mg Oral BID   ??? lisinopril (PRINIVIL, ZESTRIL) tablet 20 mg  20 mg Oral QHS   ??? simvastatin (ZOCOR) tablet 20 mg  20 mg Oral QHS   ??? misoprostol (CYTOTEC) tablet 200 mcg  200 mcg Oral BID   ??? bumetanide (BUMEX) tablet 2 mg  2 mg Oral DAILY   ??? albuterol (PROVENTIL HFA, VENTOLIN HFA) inhaler 2 Puff  2 Puff Inhalation Q6H PRN   ??? clopidogrel (PLAVIX) tablet 75 mg  75 mg Oral DAILY   ??? nitroglycerin (NITROBID) 2 % ointment 1 Inch  1 Inch Topical Q6H   ??? heparin 25,000 units in D5W 250 ml infusion  12-25 Units/kg/hr IntraVENous TITRATE   ??? oxyCODONE-acetaminophen (PERCOCET 7.5) 7.5-325 mg per tablet 1 Tab  1 Tab Oral Q4H PRN   ??? morphine injection 2 mg  2 mg IntraVENous Q5MIN PRN   ??? zolpidem (AMBIEN) tablet 5 mg  5 mg Oral QHS PRN   ??? acetaminophen (TYLENOL) tablet 650 mg  650 mg Oral Q4H PRN   ??? docusate sodium (COLACE) capsule 100 mg  100 mg Oral BID   ??? bisacodyl (DULCOLAX) suppository 10 mg  10 mg Rectal DAILY PRN   ??? fish oil-omega-3 fatty acids 340-1,000 mg capsule 1 Cap  1 Cap Oral BID   ??? potassium chloride SR (KLOR-CON 10) tablet 10 mEq  10 mEq Oral DAILY   ??? prochlorperazine (COMPAZINE) injection 5 mg  5 mg IntraVENous Q6H PRN       Review of Systems: Admission ROS by Marijo File, MD from 07/18/2011 were reviewed with the patient and changes (other than per  HPI) include: none      Objective:     Physical Exam:  Visit Vitals   Item Reading   ??? BP 141/62   ??? Pulse 79   ??? Temp 98.9 ??F (37.2 ??C)   ??? Resp 22   ??? Ht 6' (1.829 m)   ??? Wt 83.915 kg (185 lb)   ??? BMI 25.09 kg/m2   ??? SpO2 99%     SpO2 Readings from Last 6 Encounters:   07/19/11 99%   05/25/11 97%   11/13/09 98%   11/13/09 98%   11/13/09 98%   10/17/09 100%    O2 Flow Rate (L/min): 2 l/min     Intake/Output Summary (Last  24 hours) at 07/19/11 1829  Last data filed at 07/19/11 1815   Gross per 24 hour   Intake 2111.61 ml   Output   1125 ml   Net 986.61 ml      General: no distress, comfortable  Skin:  No rash or palpable dermatologic mass lesions  HEENT: Pupils equal, sclera anicteric, oropharynx with no gross lesions  Cardiovascular: No abnormal audible heart sounds, well perfused, no edema  Respiratory:  No abnormal audible breath sounds, normal respiratory effort, no throacic deformity  GI:  Abdomen nondistended, nontender, no mass, no free fluid, no rebound or guarding.  Musculoskeletal:  No skeletal deformity nor acute arthritis noted.  Neurological:  Motor and sensory function intact in upper extremeties  Psychiatric:  Normal affect, memory intact, appears to have insight into current illness  Lymphatic:  No cervical, supraclavicular, or periumbilic lymphadenopathy    Laboratory:    Recent Results (from the past 24 hour(s))   URINALYSIS W/ REFLEX CULTURE    Collection Time    07/18/11  6:38 PM       Component Value Range    Color YELLOW      Appearance CLEAR      Specific gravity 1.015  1.003 - 1.030      pH 5.0  5.0 - 8.0      Protein TRACE (*) NEGATIVE MG/DL    Glucose 161 (*) NEGATIVE MG/DL    Ketone 15 (*) NEGATIVE MG/DL    Bilirubin NEGATIVE   NEGATIVE    Blood TRACE (*) NEGATIVE    Urobilinogen 0.2  0.2 - 1.0 EU/DL    Nitrites NEGATIVE   NEGATIVE    Leukocyte Esterase NEGATIVE   NEGATIVE    WBC 0-4  0 - 4 /HPF    RBC 0-3  0 - 5 /HPF    Epithelial cells 0-5  0 - 5 /LPF    Bacteria NEGATIVE   NEGATIVE /HPF     UA:UC IF INDICATED CULTURE NOT INDICATED BY UA RESULT      Hyaline Cast 0-2  0 - 2   INFLUENZA A & B AG (RAPID TEST)    Collection Time    07/18/11  6:38 PM       Component Value Range    Influenza A Antigen NEGATIVE   NEGATIVE    Influenza B Antigen NEGATIVE   NEGATIVE   CBC WITH AUTOMATED DIFF    Collection Time    07/18/11  6:40 PM       Component Value Range    WBC 7.4  4.1 - 11.1 K/uL    RBC 2.89 (*) 4.10 - 5.70 M/uL    HGB 8.3 (*) 12.1 - 17.0 g/dL    HCT 09.6 (*) 04.5 - 50.3 %    MCV 87.9  80.0 - 99.0 FL    MCH 28.7  26.0 - 34.0 PG    MCHC 32.7  30.0 - 36.5 g/dL    RDW 40.9 (*) 81.1 - 14.5 %    PLATELET 183  150 - 400 K/uL    NEUTROPHILS 85 (*) 32 - 75 %    LYMPHOCYTES 8 (*) 12 - 49 %    MONOCYTES 7  5 - 13 %    EOSINOPHILS 0  0 - 7 %    BASOPHILS 0  0 - 1 %    ABS. NEUTROPHILS 6.3  1.8 - 8.0 K/UL    ABS. LYMPHOCYTES 0.6 (*) 0.8 - 3.5 K/UL  ABS. MONOCYTES 0.5  0.0 - 1.0 K/UL    ABS. EOSINOPHILS 0.0  0.0 - 0.4 K/UL    ABS. BASOPHILS 0.0  0.0 - 0.1 K/UL    DF SMEAR SCANNED      RBC COMMENTS 1+ ANISOCYTOSIS     HEPATIC FUNCTION PANEL    Collection Time    07/18/11  6:40 PM       Component Value Range    Protein, total 8.4 (*) 6.4 - 8.2 g/dL    Albumin 2.8 (*) 3.5 - 5.0 g/dL    Globulin 5.6 (*) 2.0 - 4.0 g/dL    A-G Ratio 0.5 (*) 1.1 - 2.2      Bilirubin, total 0.7  0.2 - 1.0 MG/DL    Bilirubin, direct 0.2  0.0 - 0.2 MG/DL    Alk. phosphatase 88  50 - 136 U/L    AST 28  15 - 37 U/L    ALT 16  12 - 78 U/L   PRO-BNP    Collection Time    07/18/11  6:45 PM       Component Value Range    NT pro-BNP 6191 (*) 0 - 125 PG/ML   TROPONIN I    Collection Time    07/18/11  6:45 PM       Component Value Range    Troponin-I, Qt. 4.77 (*) <0.05 ng/mL   CK W/ CKMB & INDEX    Collection Time    07/18/11  6:45 PM       Component Value Range    CK - MB 20.6 (*) 0.5 - 3.6 NG/ML    CK-MB Index 9.8 (*) 0 - 2.5      CK 211  39 - 308 U/L   POC CHEM8    Collection Time    07/18/11  6:52 PM       Component Value Range    Calcium, ionized  (POC) 1.05 (*) 1.12 - 1.32 MMOL/L    Sodium (POC) 136  136 - 145 MMOL/L    Potassium (POC) 4.2  3.5 - 5.1 MMOL/L    Chloride (POC) 101  98 - 107 MMOL/L    CO2 (POC) 19 (*) 21 - 32 MMOL/L    Anion gap (POC) 21 (*) 5 - 15 mmol/L    Glucose (POC) 439 (*) 75 - 110 MG/DL    BUN (POC) 34 (*) 9 - 20 MG/DL    Creatinine (POC) 1.9 (*) 0.6 - 1.3 MG/DL    GFR-AA (POC) 43 (*) >60 ml/min/1.46m2    GFR, non-AA (POC) 35 (*) >60 ml/min/1.37m2    Hemoglobin (POC) 8.8 (*) 12.1 - 17.0 GM/DL    Hematocrit (POC) 26 (*) 36.6 - 50.3 %    Comment Comment Not Indicated.     POC TROPONIN-I    Collection Time    07/18/11  6:57 PM       Component Value Range    Troponin-I (POC) 2.42 (*) 0.00 - 0.08 ng/mL   GLUCOSE, POC    Collection Time    07/18/11  6:59 PM       Component Value Range    POC GLUCOSE 471 (*) 75 - 110 mg/dL    Performed by Merrily Brittle     GLUCOSE, POC    Collection Time    07/18/11  7:59 PM       Component Value Range    POC GLUCOSE 346 (*) 75 - 110 mg/dL    Performed by Nicholes Mango  Angela     GLUCOSE, POC    Collection Time    07/18/11  8:57 PM       Component Value Range    POC GLUCOSE 311 (*) 75 - 110 mg/dL    Performed by Bradly Bienenstock     PTT    Collection Time    07/18/11  9:40 PM       Component Value Range    aPTT 35.3 (*) 24.0 - 31.5 sec    aPTT, therapeutic range      58.0 - 77.0 SECS   GLUCOSE, POC    Collection Time    07/18/11 10:16 PM       Component Value Range    POC GLUCOSE 222 (*) 75 - 110 mg/dL    Performed by HENDERSON AMY     GLUCOSE, POC    Collection Time    07/18/11 11:20 PM       Component Value Range    POC GLUCOSE 109  75 - 110 mg/dL    Performed by Kendal Hymen     GLUCOSE, POC    Collection Time    07/19/11 12:24 AM       Component Value Range    POC GLUCOSE 49 (*) 75 - 110 mg/dL    Performed by Kendal Hymen     GLUCOSE, POC    Collection Time    07/19/11 12:42 AM       Component Value Range    POC GLUCOSE 80  75 - 110 mg/dL    Performed by Kendal Hymen     CK    Collection Time    07/19/11 12:45 AM        Component Value Range    CK 464 (*) 39 - 308 U/L   TROPONIN I    Collection Time    07/19/11 12:45 AM       Component Value Range    Troponin-I, Qt. 25.38 (*) <0.05 ng/mL   GLUCOSE, POC    Collection Time    07/19/11  1:01 AM       Component Value Range    POC GLUCOSE 95  75 - 110 mg/dL    Performed by Lanh (Moutous) Ravy     GLUCOSE, POC    Collection Time    07/19/11  2:03 AM       Component Value Range    POC GLUCOSE 48 (*) 75 - 110 mg/dL    Performed by Kendal Hymen     GLUCOSE, POC    Collection Time    07/19/11  2:05 AM       Component Value Range    POC GLUCOSE 48 (*) 75 - 110 mg/dL    Performed by Kendal Hymen     GLUCOSE, POC    Collection Time    07/19/11  2:21 AM       Component Value Range    POC GLUCOSE 120 (*) 75 - 110 mg/dL    Performed by Lanh (Moutous) Ravy     CK    Collection Time    07/19/11  3:30 AM       Component Value Range    CK 436 (*) 39 - 308 U/L   TROPONIN I    Collection Time    07/19/11  3:30 AM       Component Value Range    Troponin-I, Qt. 26.85 (*) <0.05 ng/mL   CBC W/O DIFF    Collection Time  07/19/11  3:30 AM       Component Value Range    WBC 5.6  4.1 - 11.1 K/uL    RBC 2.42 (*) 4.10 - 5.70 M/uL    HGB 7.1 (*) 12.1 - 17.0 g/dL    HCT 16.1 (*) 09.6 - 50.3 %    MCV 87.6  80.0 - 99.0 FL    MCH 29.3  26.0 - 34.0 PG    MCHC 33.5  30.0 - 36.5 g/dL    RDW 04.5 (*) 40.9 - 14.5 %    PLATELET 186  150 - 400 K/uL   MAGNESIUM    Collection Time    07/19/11  3:30 AM       Component Value Range    Magnesium 1.9  1.6 - 2.4 MG/DL   PHOSPHORUS    Collection Time    07/19/11  3:30 AM       Component Value Range    Phosphorus 3.9  2.5 - 4.9 MG/DL   METABOLIC PANEL, BASIC    Collection Time    07/19/11  3:30 AM       Component Value Range    Sodium 140  136 - 145 MMOL/L    Potassium 3.7  3.5 - 5.1 MMOL/L    Chloride 103  97 - 108 MMOL/L    CO2 31  21 - 32 MMOL/L    Anion gap 6  5 - 15 mmol/L    Glucose 61 (*) 65 - 100 MG/DL    BUN 35 (*) 6 - 20 MG/DL    Creatinine 8.11 (*) 0.45 - 1.15 MG/DL     BUN/Creatinine ratio 19  12 - 20      GFR est AA 45 (*) >60 ml/min/1.3m2    GFR est non-AA 37 (*) >60 ml/min/1.53m2    Calcium 8.1 (*) 8.5 - 10.1 MG/DL   PTT    Collection Time    07/19/11  3:30 AM       Component Value Range    aPTT 104.1 (*) 24.0 - 31.5 sec    aPTT, therapeutic range      58.0 - 77.0 SECS   GLUCOSE, POC    Collection Time    07/19/11  3:30 AM       Component Value Range    POC GLUCOSE 157 (*) 75 - 110 mg/dL    Performed by Lanh (Moutous) Ravy     GLUCOSE, POC    Collection Time    07/19/11  4:33 AM       Component Value Range    POC GLUCOSE 42 (*) 75 - 110 mg/dL    Performed by Kendal Hymen     GLUCOSE, POC    Collection Time    07/19/11  4:52 AM       Component Value Range    POC GLUCOSE 104  75 - 110 mg/dL    Performed by Kendal Hymen     GLUCOSE, POC    Collection Time    07/19/11  5:49 AM       Component Value Range    POC GLUCOSE 83  75 - 110 mg/dL    Performed by Lanh (Moutous) Ravy     TYPE & CROSSMATCH    Collection Time    07/19/11  6:50 AM       Component Value Range    Crossmatch Expiration 07/22/2011      ABO/Rh(D) O POS      Antibody screen NEG  Unit number [id number]      Blood component type RC LR AS5      Unit division 00      Status of unit ISSUED      Crossmatch result COMPATIBLE      Unit number [id number]      Blood component type RC LR AS5      Unit division 00      Status of unit ISSUED      Crossmatch result COMPATIBLE     GLUCOSE, POC    Collection Time    07/19/11  6:59 AM       Component Value Range    POC GLUCOSE 98  75 - 110 mg/dL    Performed by Lanh (Moutous) Ravy     GLUCOSE, POC    Collection Time    07/19/11  9:02 AM       Component Value Range    POC GLUCOSE 89  75 - 110 mg/dL    Performed by Kermit Balo     EKG, 12 LEAD, INITIAL    Collection Time    07/19/11  9:27 AM       Component Value Range    Ventricular Rate 82      Atrial Rate 82      P-R Interval 190      QRS Duration 126      Q-T Interval 442      QTC Calculation (Bezet) 516      Calculated P Axis  39      Calculated R Axis 81      Calculated T Axis -119      Diagnosis        Value: Sinus rhythm with occasional premature ventricular complexes      Nonspecific intraventricular block      Possible Inferior infarct , age undetermined      STT changes lateral leads      Abnormal ECG      When compared with ECG of 18-Jul-2011 18:22,      STT changes lateral leads less evident      Confirmed by Dorene Grebe ([id number]) on 07/19/2011 3:34:40 PM   CK    Collection Time    07/19/11 10:30 AM       Component Value Range    CK 448 (*) 39 - 308 U/L   TROPONIN I    Collection Time    07/19/11 10:30 AM       Component Value Range    Troponin-I, Qt. 17.72 (*) <0.05 ng/mL   PTT    Collection Time    07/19/11 10:30 AM       Component Value Range    aPTT 67.1 (*) 24.0 - 31.5 sec    aPTT, therapeutic range      58.0 - 77.0 SECS   OCCULT BLOOD, STOOL    Collection Time    07/19/11 10:30 AM       Component Value Range    Occult blood, stool POSITIVE (*) NEGATIVE   GLUCOSE, POC    Collection Time    07/19/11 10:47 AM       Component Value Range    POC GLUCOSE 86  75 - 110 mg/dL    Performed by Kermit Balo           Assessment/Plan:     Principal Problem:   *NSTEMI (non-ST elevated myocardial infarction) (05/16/2011)  Active Problems:     CAD (coronary artery disease) ()  Cath 1999 distal disease in the apical LAD and right coronary artery with    a normal ejection fraction.   Non q MI in 7/06.cath - mod LAD abn RCA disease up to 50-60% , small    occluded LCX with normal LVEF.     (?NSTEMI) during admission with pneumonia and ARF 05/2009 - proceeded to    have a PCI   (DES) to RCA.     NSTEMI (trop ~30) on 06/21/09 but cath showed stable findings.     NSTEMI with another cath in 2/11 - the stent was patent and no new    findings.     Multiple NSTEMI's in 5/11 - proceeded on 10/16/09 to PCI of 85% ulcerated    plaque mid RCA successfully stented with 2.75 Xience.  CTO Cx successful    PTCA and DES of proximal part with 2.25 Atom.             Anemia, unspecified (09/29/2009)     Hyperlipidemia (10/06/2009)     HTN (hypertension) (10/13/2009)     DKA, type 2, not at goal (05/16/2011)     Unspecified hypothyroidism (05/16/2011)     PAD (peripheral artery disease) (05/18/2011)   PVR tracing 05/18/2011 suggest bilateral SFA occlusion and calf vessel    disease    and high grade stenosis of his proximal fem-pop   Angio March 2012 ([facility]): r sfa-ak pop with prox stenosis, 1 vs r/o; left    diffuse sfa , severe tpt disease     CKD (chronic kidney disease) stage 3, GFR 30-59 ml/min (07/18/2011)     Foot ulcer (07/18/2011)       See above narrative for full detail.

## 2011-07-19 NOTE — Progress Notes (Signed)
Discussed with Dr. Elmon Kirschner. He will review the previous cath then decide further plan of care if we need to proceed with another cath this time.     Travis [name] K. Dagoberto Reef, MD, [facility]

## 2011-07-20 LAB — CBC WITH AUTOMATED DIFF
ABS. BASOPHILS: 0 10*3/uL (ref 0.0–0.1)
ABS. EOSINOPHILS: 0.1 10*3/uL (ref 0.0–0.4)
ABS. LYMPHOCYTES: 1.2 10*3/uL (ref 0.8–3.5)
ABS. MONOCYTES: 0.8 10*3/uL (ref 0.0–1.0)
ABS. NEUTROPHILS: 3.2 10*3/uL (ref 1.8–8.0)
BASOPHILS: 0 % (ref 0–1)
EOSINOPHILS: 2 % (ref 0–7)
HCT: 30.4 % — ABNORMAL LOW (ref 36.6–50.3)
HGB: 10.2 g/dL — ABNORMAL LOW (ref 12.1–17.0)
LYMPHOCYTES: 23 % (ref 12–49)
MCH: 28.7 PG (ref 26.0–34.0)
MCHC: 33.6 g/dL (ref 30.0–36.5)
MCV: 85.6 FL (ref 80.0–99.0)
MONOCYTES: 15 % — ABNORMAL HIGH (ref 5–13)
NEUTROPHILS: 60 % (ref 32–75)
PLATELET: 159 10*3/uL (ref 150–400)
RBC: 3.55 M/uL — ABNORMAL LOW (ref 4.10–5.70)
RDW: 16.4 % — ABNORMAL HIGH (ref 11.5–14.5)
WBC: 5.3 10*3/uL (ref 4.1–11.1)

## 2011-07-20 LAB — PTT
aPTT: 44.6 s — ABNORMAL HIGH (ref 24.0–31.5)
aPTT: 76 s — ABNORMAL HIGH (ref 24.0–31.5)
aPTT: 86.5 s — ABNORMAL HIGH (ref 24.0–31.5)

## 2011-07-20 LAB — TYPE + CROSSMATCH
ABO/Rh(D): O POS
Antibody screen: NEGATIVE
Status of unit: TRANSFUSED
Status of unit: TRANSFUSED
Unit division: 0
Unit division: 0

## 2011-07-20 LAB — RENAL FUNCTION PANEL
Albumin: 2.3 g/dL — ABNORMAL LOW (ref 3.5–5.0)
Anion gap: 8 mmol/L (ref 5–15)
BUN/Creatinine ratio: 21 — ABNORMAL HIGH (ref 12–20)
BUN: 29 MG/DL — ABNORMAL HIGH (ref 6–20)
CO2: 28 MMOL/L (ref 21–32)
Calcium: 7.8 MG/DL — ABNORMAL LOW (ref 8.5–10.1)
Chloride: 100 MMOL/L (ref 97–108)
Creatinine: 1.39 MG/DL — ABNORMAL HIGH (ref 0.45–1.15)
GFR est AA: >60 mL/min/{1.73_m2} (ref >60)
GFR est non-AA: 50 mL/min/{1.73_m2} — ABNORMAL LOW (ref >60)
Glucose: 78 MG/DL (ref 65–100)
Phosphorus: 2.6 MG/DL (ref 2.5–4.9)
Potassium: 3.3 MMOL/L — ABNORMAL LOW (ref 3.5–5.1)
Sodium: 136 MMOL/L (ref 136–145)

## 2011-07-20 LAB — GLUCOSE, POC
Glucose (POC): 296 mg/dL — ABNORMAL HIGH (ref 75–110)
Glucose (POC): 314 mg/dL — ABNORMAL HIGH (ref 75–110)
Glucose (POC): 129 mg/dL — ABNORMAL HIGH (ref 75–110)
Glucose (POC): 209 mg/dL — ABNORMAL HIGH (ref 75–110)

## 2011-07-20 LAB — MAGNESIUM: Magnesium: 1.5 MG/DL — ABNORMAL LOW (ref 1.6–2.4)

## 2011-07-20 MED ORDER — METOPROLOL TARTRATE 25 MG TAB
25 mg | Freq: Once | ORAL | Status: AC
Start: 2011-07-20 — End: 2011-07-20
  Administered 2011-07-20: 17:00:00 via ORAL

## 2011-07-20 MED ORDER — MAGNESIUM SULFATE 2 GRAM/50 ML IVPB
2 gram/50 mL (4 %) | Freq: Once | INTRAVENOUS | Status: AC
Start: 2011-07-20 — End: 2011-07-20
  Administered 2011-07-20: 16:00:00 via INTRAVENOUS

## 2011-07-20 MED ORDER — FERROUS SULFATE 325 MG (65 MG ELEMENTAL IRON) TAB
325 mg (65 mg iron) | Freq: Two times a day (BID) | ORAL | Status: DC
Start: 2011-07-20 — End: 2011-07-24
  Administered 2011-07-20 – 2011-07-24 (×9): via ORAL

## 2011-07-20 MED ORDER — METOPROLOL TARTRATE 25 MG TAB
25 mg | Freq: Two times a day (BID) | ORAL | Status: DC
Start: 2011-07-20 — End: 2011-07-23
  Administered 2011-07-20 – 2011-07-23 (×6): via ORAL

## 2011-07-20 MED ORDER — ATORVASTATIN 20 MG TAB
20 mg | Freq: Every evening | ORAL | Status: DC
Start: 2011-07-20 — End: 2011-07-24
  Administered 2011-07-21 – 2011-07-24 (×4): via ORAL

## 2011-07-20 MED ORDER — POTASSIUM CHLORIDE SR 10 MEQ TAB
10 mEq | ORAL | Status: AC
Start: 2011-07-20 — End: 2011-07-20
  Administered 2011-07-20: 13:00:00 via ORAL

## 2011-07-20 MED ORDER — HEPARIN (PORCINE) 5,000 UNIT/ML IJ SOLN
5000 unit/mL | Freq: Once | INTRAMUSCULAR | Status: AC
Start: 2011-07-20 — End: 2011-07-20
  Administered 2011-07-20: 06:00:00 via INTRAVENOUS

## 2011-07-20 MED FILL — FERROUS SULFATE 325 MG (65 MG ELEMENTAL IRON) TAB: 325 mg (65 mg iron) | ORAL | Qty: 1

## 2011-07-20 MED FILL — METOPROLOL TARTRATE 25 MG TAB: 25 mg | ORAL | Qty: 1

## 2011-07-20 MED FILL — DOK 100 MG CAPSULE: 100 mg | ORAL | Qty: 1

## 2011-07-20 MED FILL — METOPROLOL TARTRATE 50 MG TAB: 50 mg | ORAL | Qty: 1

## 2011-07-20 MED FILL — FISH OIL 340 MG-1,000 MG CAPSULE: 340-1000 mg | ORAL | Qty: 1

## 2011-07-20 MED FILL — INSULIN LISPRO 100 UNIT/ML INJECTION: 100 unit/mL | SUBCUTANEOUS | Qty: 1

## 2011-07-20 MED FILL — LISINOPRIL 20 MG TAB: 20 mg | ORAL | Qty: 1

## 2011-07-20 MED FILL — MISOPROSTOL 200 MCG TAB: 200 mcg | ORAL | Qty: 1

## 2011-07-20 MED FILL — HEPARIN (PORCINE) 5,000 UNIT/ML IJ SOLN: 5000 unit/mL | INTRAMUSCULAR | Qty: 1

## 2011-07-20 MED FILL — PROTONIX 40 MG INTRAVENOUS SOLUTION: 40 mg | INTRAVENOUS | Qty: 40

## 2011-07-20 MED FILL — K-TAB 10 MEQ TABLET,EXTENDED RELEASE: 10 mEq | ORAL | Qty: 2

## 2011-07-20 MED FILL — NITRO-BID 2 % TRANSDERMAL OINTMENT: 2 % | TRANSDERMAL | Qty: 30

## 2011-07-20 MED FILL — ASPIRIN 81 MG TAB, DELAYED RELEASE: 81 mg | ORAL | Qty: 1

## 2011-07-20 MED FILL — PLAVIX 75 MG TABLET: 75 mg | ORAL | Qty: 1

## 2011-07-20 MED FILL — TERAZOSIN 5 MG CAP: 5 mg | ORAL | Qty: 2

## 2011-07-20 MED FILL — SIMVASTATIN 20 MG TAB: 20 mg | ORAL | Qty: 1

## 2011-07-20 MED FILL — BUMETANIDE 1 MG TAB: 1 mg | ORAL | Qty: 2

## 2011-07-20 MED FILL — MAGNESIUM SULFATE 2 GRAM/50 ML IVPB: 2 gram/50 mL (4 %) | INTRAVENOUS | Qty: 50

## 2011-07-20 MED FILL — HEPARIN (PORCINE) IN D5W 25,000 UNIT/250 ML IV: 25000 unit/250 mL(100 unit/mL) | INTRAVENOUS | Qty: 250

## 2011-07-20 MED FILL — LEVOTHYROXINE 150 MCG TAB: 150 mcg | ORAL | Qty: 1

## 2011-07-20 NOTE — Progress Notes (Signed)
[facility] Progress Note  [facility]   [address] [place]ette Monarch [place], [place] [address]   [phone]      Assessment & Plan:   1. CKD 3:Cr runs between 1.7 to 2.1 mg%   - cr is below his baseline      2. Anemia    - will Get enough iron with blood   - on epo   - transfused 2 units   3. NSTEMI:Severe CAD   - DW him about contrast nephropathy   -IVF if contrast planned   - HE DOES NOT WANT HD in any circumstances   4.DM   5.HTN:OK this am   6. CAD   6.PAD          Subjective:   CC:f/up for ARF,CKD  HPI: Patient seen   1. ckd is stable  2. Anemia:got 2 unit of blood  3.IO:NGEX for cath monday  ROS:no cp/sob/n/v/abd pain  Current Facility-Administered Medications   Medication Dose Route Frequency   ??? heparin (porcine) injection 2,000 Units  2,000 Units IntraVENous ONCE   ??? magnesium sulfate 2 g/50 ml IVPB (premix or compounded)  2 g IntraVENous ONCE   ??? potassium chloride SR (KLOR-CON 10) tablet 20 mEq  20 mEq Oral NOW   ??? ferrous sulfate tablet 325 mg  1 Tab Oral BID WITH MEALS   ??? metoprolol (LOPRESSOR) tablet 75 mg  75 mg Oral BID   ??? metoprolol (LOPRESSOR) tablet 25 mg  25 mg Oral ONCE   ??? atorvastatin (LIPITOR) tablet 40 mg  40 mg Oral QHS   ??? epoetin alfa (EPOGEN;PROCRIT) injection 10,000 Units  10,000 Units SubCUTAneous Q7D   ??? insulin lispro (HUMALOG) injection   SubCUTAneous AC&HS   ??? bumetanide (BUMEX) injection 1 mg  1 mg IntraVENous ONCE   ??? pantoprazole (PROTONIX) injection 40 mg  40 mg IntraVENous Q12H   ??? 0.9% sodium chloride infusion  25 mL/hr IntraVENous CONTINUOUS   ??? glucose chewable tablet 16 g  4 Tab Oral PRN   ??? dextrose (D50W) injection Syrg 12.5-25 g  12.5-25 g IntraVENous PRN   ??? glucagon (GLUCAGEN) injection 1 mg  1 mg IntraMUSCular PRN   ??? aspirin delayed-release tablet 81 mg  81 mg Oral DAILY   ??? levothyroxine (SYNTHROID) tablet 150 mcg  150 mcg Oral ACB   ??? terazosin (HYTRIN) capsule 10 mg  10 mg Oral QHS   ??? lisinopril (PRINIVIL,  ZESTRIL) tablet 20 mg  20 mg Oral QHS   ??? misoprostol (CYTOTEC) tablet 200 mcg  200 mcg Oral BID   ??? bumetanide (BUMEX) tablet 2 mg  2 mg Oral DAILY   ??? albuterol (PROVENTIL HFA, VENTOLIN HFA) inhaler 2 Puff  2 Puff Inhalation Q6H PRN   ??? clopidogrel (PLAVIX) tablet 75 mg  75 mg Oral DAILY   ??? nitroglycerin (NITROBID) 2 % ointment 1 Inch  1 Inch Topical Q6H   ??? heparin 25,000 units in D5W 250 ml infusion  12-25 Units/kg/hr IntraVENous TITRATE   ??? oxyCODONE-acetaminophen (PERCOCET 7.5) 7.5-325 mg per tablet 1 Tab  1 Tab Oral Q4H PRN   ??? morphine injection 2 mg  2 mg IntraVENous Q5MIN PRN   ??? zolpidem (AMBIEN) tablet 5 mg  5 mg Oral QHS PRN   ??? acetaminophen (TYLENOL) tablet 650 mg  650 mg Oral Q4H PRN   ??? docusate sodium (COLACE) capsule 100 mg  100 mg Oral BID   ??? bisacodyl (DULCOLAX) suppository 10 mg  10 mg Rectal DAILY PRN   ??? fish oil-omega-3 fatty acids 340-1,000 mg capsule 1 Cap  1 Cap Oral BID   ??? prochlorperazine (COMPAZINE) injection 5 mg  5 mg IntraVENous Q6H PRN          Objective:     Vitals:  Blood pressure 133/60, pulse 76, temperature 98.5 ??F (36.9 ??C), resp. rate 16, height 6' (1.829 m), weight 186 lb 1.6 oz (84.414 kg), SpO2 94.00%.  Temp (24hrs), Avg:98.9 ??F (37.2 ??C), Min:98.2 ??F (36.8 ??C), Max:99.6 ??F (37.6 ??C)      Intake and Output:  02/23 0700 - 02/23 1859  In: 600 [P.O.:600]  Out: 700 [Urine:700]  02/21 1900 - 02/23 0659  In: 2111.6 [P.O.:320; I.V.:861.6]  Out: 2225 [Urine:2225]    Physical Exam:                Patient is intubated:  no    Physical Examination:   GENERAL ASSESSMENT: NAD  HEENT:Nontraumatic   CHEST: CTA  HEART: S1S2  ABDOMEN: Soft,NT,  UE:AVWUJ: n  EXTREMITY: EDEMA 1  NEURO:Grossly non focal          ECG/rhythm:    Data Review      Recent Labs   Basename 07/18/11 1857    ITNL 2.42*      Recent Labs   Basename 07/19/11 1030 07/19/11 0330 07/19/11 0045    CPK 448* 436* 464*    CKMB -- -- --    TROIQ 17.72* 26.85* 25.38*     Recent Labs   Basename 07/20/11 0420 07/19/11 0330  07/18/11 1840    NA 136 140 --    K 3.3* 3.7 --    CL 100 103 --    CO2 28 31 --    BUN 29* 35* --    CREA 1.39* 1.80* --    GLU 78 61* --    PHOS 2.6 3.9 --    MG 1.5* 1.9 --    ALB 2.3* -- 2.8*    WBC 5.3 5.6 7.4    HGB 10.2* 7.1* 8.3*    HCT 30.4* 21.2* 25.4*    PLT 159 186 183     No results found for this basename: INR:3,PTP:3,APTT:3, in the last 72 hours  Needs: urine analysis, urine sodium, protein and creatinine  Lab Results   Component Value Date/Time    Sodium,urine random 59 05/21/2011  9:00 PM    Creatinine,urine random 32.49 05/21/2011  9:00 PM         Discussed with:  Nursing, pt          [facility]  [address]  [place], [place] [address]  Phone - [phone]  Fax - [phone]

## 2011-07-20 NOTE — Consults (Signed)
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

## 2011-07-20 NOTE — Progress Notes (Addendum)
1645  Right foot dressing changed per order - acticoat and kurlex used to dress foot.  Pt tolerated well.  Will continue to monitor closely.      1913 Bedside and Verbal shift change report given to Bjorn Loser, Charity fundraiser (oncoming nurse) by Paulino Rily (offgoing nurse).  Report given with SBAR, Kardex, Intake/Output, MAR and Recent Results.

## 2011-07-20 NOTE — Progress Notes (Addendum)
Bedside and Verbal shift change report given to Juline Patch RN (oncoming nurse) by Aleene Davidson RN (offgoing nurse).  Report given with SBAR, Intake/Output, MAR and Recent Results.   1932 PTT 62.2  No change, theraputic.  2019 Discontinued # 20 right IJ because it wouldn't flush. Discontinued # 20 Left AC , it was bleeding.  2116 Blood Glucose was 299 mg/dl. Gave 9 units Humalog subq. Gave snack.  0150 Placed # 20 IV canula in pts' forearm. Blood drawn from left arm for Stat  PTT and sent to lab in red stat bag  0250 Called lab about stat PTT. Nicole in lab asked for 10 more mins.  5409 For PTT  51.8 increased Heparin drip by 1 unit/kg/hr now at 11/units/kg/hr  0742 Bedside and Verbal shift change report given to Cyndi Bender RN (oncoming nurse) by Juline Patch RN (offgoing nurse).  Report given with SBAR, MAR and Recent Results.

## 2011-07-20 NOTE — Progress Notes (Addendum)
[facility]  [address] [place] [place], [place] [address]  [phone]      Medical Progress Note      NAME: Travis [name]   DOB:  09-Jul-1940  MRM:  [id number]    Date/Time: 07/20/2011  7:40 AM         Subjective:     Chief Complaint:  Feels well.    Denies chest pain, sob.  No n/v.  Denies abd pain.  +BM yd.  No diarrhea.    ROS:  (bold if positive, if negative)              Objective:       Vitals:          Last 24hrs VS reviewed since prior progress note. Most recent are:    Visit Vitals   Item Reading   ??? BP 157/55   ??? Pulse 77   ??? Temp 98.6 ??F (37 ??C)   ??? Resp 22   ??? Ht 6' (1.829 m)   ??? Wt 186 lb 1.6 oz   ??? BMI 25.24 kg/m2   ??? SpO2 93%     SpO2 Readings from Last 6 Encounters:   07/20/11 93%   05/25/11 97%   11/13/09 98%   11/13/09 98%   11/13/09 98%   10/17/09 100%    O2 Flow Rate (L/min): 2 l/min     Intake/Output Summary (Last 24 hours) at 07/20/11 0740  Last data filed at 07/20/11 0413   Gross per 24 hour   Intake   1250 ml   Output   2000 ml   Net   -750 ml          Exam:     Physical Exam:    Gen:  Well-developed, well-nourished, in no acute distress  HEENT:  Pink conjunctivae, PERRL, hearing intact to voice, moist mucous membranes  Neck:  Supple, without masses, thyroid non-tender  Resp:  No accessory muscle use, rales on right, no wheezes or rhonchi  Card:  No murmurs, normal S1, S2 without thrills or peripheral edema  Abd:  Soft, non-tender, non-distended, normoactive bowel sounds are present  Musc:  No cyanosis or clubbing, ulcer left great toe and 2nd toe with necrotic tissue and odor without drainage  Skin:  No rashes, skin turgor is decreased  Neuro:  Cranial nerves 3-12 are grossly intact, grip strength is 5/5 bilaterally and dorsi / plantarflexion is 5/5 bilaterally, follows commands appropriately  Psych:  Good insight, oriented to person, place and time, alert       Telemetry reviewed:   NSVT    Medications Reviewed: (see below)    Lab Data Reviewed: (see below)     ______________________________________________________________________    Medications:     Current Facility-Administered Medications   Medication Dose Route Frequency   ??? heparin (porcine) injection 2,000 Units  2,000 Units IntraVENous ONCE   ??? magnesium sulfate 2 g/50 ml IVPB (premix or compounded)  2 g IntraVENous ONCE   ??? potassium chloride SR (KLOR-CON 10) tablet 20 mEq  20 mEq Oral NOW   ??? epoetin alfa (EPOGEN;PROCRIT) injection 10,000 Units  10,000 Units SubCUTAneous Q7D   ??? insulin lispro (HUMALOG) injection   SubCUTAneous AC&HS   ??? bumetanide (BUMEX) injection 1 mg  1 mg IntraVENous ONCE   ??? pantoprazole (PROTONIX) injection 40 mg  40 mg IntraVENous Q12H   ??? 0.9% sodium chloride infusion  25 mL/hr IntraVENous CONTINUOUS   ??? ferrous  sulfate tablet 325 mg  1 Tab Oral DAILY WITH BREAKFAST   ??? glucose chewable tablet 16 g  4 Tab Oral PRN   ??? dextrose (D50W) injection Syrg 12.5-25 g  12.5-25 g IntraVENous PRN   ??? glucagon (GLUCAGEN) injection 1 mg  1 mg IntraMUSCular PRN   ??? aspirin delayed-release tablet 81 mg  81 mg Oral DAILY   ??? levothyroxine (SYNTHROID) tablet 150 mcg  150 mcg Oral ACB   ??? terazosin (HYTRIN) capsule 10 mg  10 mg Oral QHS   ??? metoprolol (LOPRESSOR) tablet 50 mg  50 mg Oral BID   ??? lisinopril (PRINIVIL, ZESTRIL) tablet 20 mg  20 mg Oral QHS   ??? simvastatin (ZOCOR) tablet 20 mg  20 mg Oral QHS   ??? misoprostol (CYTOTEC) tablet 200 mcg  200 mcg Oral BID   ??? bumetanide (BUMEX) tablet 2 mg  2 mg Oral DAILY   ??? albuterol (PROVENTIL HFA, VENTOLIN HFA) inhaler 2 Puff  2 Puff Inhalation Q6H PRN   ??? clopidogrel (PLAVIX) tablet 75 mg  75 mg Oral DAILY   ??? nitroglycerin (NITROBID) 2 % ointment 1 Inch  1 Inch Topical Q6H   ??? heparin 25,000 units in D5W 250 ml infusion  12-25 Units/kg/hr IntraVENous TITRATE   ??? oxyCODONE-acetaminophen (PERCOCET 7.5) 7.5-325 mg per tablet 1 Tab  1 Tab Oral Q4H PRN   ??? morphine injection 2 mg  2 mg IntraVENous Q5MIN PRN   ??? zolpidem (AMBIEN) tablet 5 mg  5 mg Oral QHS PRN    ??? acetaminophen (TYLENOL) tablet 650 mg  650 mg Oral Q4H PRN   ??? docusate sodium (COLACE) capsule 100 mg  100 mg Oral BID   ??? bisacodyl (DULCOLAX) suppository 10 mg  10 mg Rectal DAILY PRN   ??? fish oil-omega-3 fatty acids 340-1,000 mg capsule 1 Cap  1 Cap Oral BID   ??? prochlorperazine (COMPAZINE) injection 5 mg  5 mg IntraVENous Q6H PRN            Lab Review:     Recent Labs   Basename 07/20/11 0420 07/19/11 0330 07/18/11 1840    WBC 5.3 5.6 7.4    HGB 10.2* 7.1* 8.3*    HCT 30.4* 21.2* 25.4*    PLT 159 186 183     Recent Labs   Basename 07/20/11 0420 07/19/11 0330 07/18/11 1840    NA 136 140 --    K 3.3* 3.7 --    CL 100 103 --    CO2 28 31 --    GLU 78 61* --    BUN 29* 35* --    CREA 1.39* 1.80* --    CA 7.8* 8.1* --    MG 1.5* 1.9 --    PHOS 2.6 3.9 --    ALB 2.3* -- 2.8*    TBIL -- -- 0.7    SGOT -- -- 28    INR -- -- --     Lab Results   Component Value Date/Time    POC GLUCOSE 296 07/19/2011  9:16 PM    POC GLUCOSE 314 07/19/2011  5:39 PM    POC GLUCOSE 86 07/19/2011 10:47 AM    POC GLUCOSE 89 07/19/2011  9:02 AM    POC GLUCOSE 98 07/19/2011  6:59 AM            Assessment / Plan:     NSTEMI (non-ST elevated myocardial infarction) / CAD (coronary artery disease) / HTN / NSVT / Chronic diastolic  and systolic CHF:  Appreciate cardiology evaluation.   -- continue on heparin gtt    -- continue ASA, Plavix, lisinopril, metoprolol   -- possible cath on Monday   -- replete electrolytes   -- continue bumex   -- daily weight   -- I/O    Anemia, unspecified (09/29/2009):  S/p transfusion with rise in hgb.   -- continue iron and procrit   -- monitor hgb     Hyperlipidemia (10/06/2009)    -- continue statin and fish oil     DM (05/16/2011):  Insulin gtt stopped for hypoglycemia.  Elevated glucose last night, but this AM is down to 78.   -- continue SSI    -- restart lantus if glucose persistently elevated    Unspecified hypothyroidism (05/16/2011)    -- continue LT4    PAD (peripheral artery disease) (05/18/2011) / Foot ulcer  (07/18/2011):  Appreciate wound care eval.   -- continue local wound care   -- await vascular surg eval    CKD (chronic kidney disease) stage 3, GFR 30-59 ml/min (07/18/2011):  Creat improved overnight.  Appreciate renal eval.   -- follow creat    Ulcer Toe:  Diabetic ulcer.  Possible infection.   -- check xray foot   -- ID eval    Child is surrogate decision maker.    Total time spent with patient: 55 Minutes                  Care Plan discussed with: Patient and Nursing Staff    Discussed:  Care Plan    Prophylaxis:  Hep IV and H2B/PPI    Disposition:  Home w/Family           ___________________________________________________    Attending Physician: Suzan Garibaldi, MD

## 2011-07-20 NOTE — Progress Notes (Signed)
Follow-up visit; Mr Travis [name] is very quiet tonight and shares that he has no concerns at this time. Assured of chaplain availability and encouraged to have staff contact Pastoral Care Services if chaplain visit is desired.    Chaplain Intern: Cyndia Diver. Cobb    To contact pastoral care call:  [phone]

## 2011-07-20 NOTE — Progress Notes (Addendum)
Problem: Pressure Ulcer - Risk of  Goal: *Prevention of pressure ulcer  Outcome: Progressing Towards Goal  Pt sat in chair all night. I discussed skin break down with him but he refused to get in bed.

## 2011-07-20 NOTE — Progress Notes (Addendum)
Tharon Aquas, MD. [facility]  [address], [address]  [place], [place] [address]  Ph [phone];   Fax [phone]      Patient: Travis [name]  DOB: 11-20-1940      Today's Date: 07/20/2011        CARDIOLOGY PROGRESS NOTE   S: No chest pain - feels better today (felt "sick" yesterday.   O:BP 150/62   Pulse 83   Temp 98.6 ??F (37 ??C)   Resp 21   Ht 6' (1.829 m)   Wt 186 lb 1.6 oz (84.414 kg)   BMI 25.24 kg/m2   SpO2 93%   Patient appears generally well, mood and affect are appropriate and pleasant.   HEENT: Normocephalic, atraumatic.   Neck Exam: Supple   Lung Exam: few coarse sounds at bases, but otherwise clear.   Cardiac Exam: Regular rate and rhythm with no murmur   Abdomen: Soft, non-tender, non-distended.   Extremities: mild left leg edema.       ROS - breathing stable, no abd complaints, foot pain better recently (able to walk more), no obvious blood loss from stool per patient      Current Facility-Administered Medications   Medication Dose Route Frequency   ??? heparin (porcine) injection 2,000 Units  2,000 Units IntraVENous ONCE   ??? magnesium sulfate 2 g/50 ml IVPB (premix or compounded)  2 g IntraVENous ONCE   ??? potassium chloride SR (KLOR-CON 10) tablet 20 mEq  20 mEq Oral NOW   ??? ferrous sulfate tablet 325 mg  1 Tab Oral BID WITH MEALS   ??? epoetin alfa (EPOGEN;PROCRIT) injection 10,000 Units  10,000 Units SubCUTAneous Q7D   ??? insulin lispro (HUMALOG) injection   SubCUTAneous AC&HS   ??? bumetanide (BUMEX) injection 1 mg  1 mg IntraVENous ONCE   ??? pantoprazole (PROTONIX) injection 40 mg  40 mg IntraVENous Q12H   ??? 0.9% sodium chloride infusion  25 mL/hr IntraVENous CONTINUOUS   ??? DISCONTD: 0.9% sodium chloride infusion  50 mL/hr IntraVENous CONTINUOUS   ??? DISCONTD: dextrose (D50W) injection Syrg 12.5-25 g  12.5-25 g IntraVENous PRN   ??? DISCONTD: ferrous sulfate tablet 325 mg  1 Tab Oral DAILY WITH BREAKFAST   ??? glucose chewable tablet 16 g  4 Tab Oral PRN   ??? dextrose (D50W) injection Syrg  12.5-25 g  12.5-25 g IntraVENous PRN   ??? glucagon (GLUCAGEN) injection 1 mg  1 mg IntraMUSCular PRN   ??? aspirin delayed-release tablet 81 mg  81 mg Oral DAILY   ??? levothyroxine (SYNTHROID) tablet 150 mcg  150 mcg Oral ACB   ??? terazosin (HYTRIN) capsule 10 mg  10 mg Oral QHS   ??? metoprolol (LOPRESSOR) tablet 50 mg  50 mg Oral BID   ??? lisinopril (PRINIVIL, ZESTRIL) tablet 20 mg  20 mg Oral QHS   ??? simvastatin (ZOCOR) tablet 20 mg  20 mg Oral QHS   ??? misoprostol (CYTOTEC) tablet 200 mcg  200 mcg Oral BID   ??? bumetanide (BUMEX) tablet 2 mg  2 mg Oral DAILY   ??? albuterol (PROVENTIL HFA, VENTOLIN HFA) inhaler 2 Puff  2 Puff Inhalation Q6H PRN   ??? clopidogrel (PLAVIX) tablet 75 mg  75 mg Oral DAILY   ??? nitroglycerin (NITROBID) 2 % ointment 1 Inch  1 Inch Topical Q6H   ??? heparin 25,000 units in D5W 250 ml infusion  12-25 Units/kg/hr IntraVENous TITRATE   ??? oxyCODONE-acetaminophen (PERCOCET 7.5) 7.5-325 mg per tablet 1 Tab  1  Tab Oral Q4H PRN   ??? morphine injection 2 mg  2 mg IntraVENous Q5MIN PRN   ??? zolpidem (AMBIEN) tablet 5 mg  5 mg Oral QHS PRN   ??? acetaminophen (TYLENOL) tablet 650 mg  650 mg Oral Q4H PRN   ??? docusate sodium (COLACE) capsule 100 mg  100 mg Oral BID   ??? bisacodyl (DULCOLAX) suppository 10 mg  10 mg Rectal DAILY PRN   ??? fish oil-omega-3 fatty acids 340-1,000 mg capsule 1 Cap  1 Cap Oral BID   ??? prochlorperazine (COMPAZINE) injection 5 mg  5 mg IntraVENous Q6H PRN   ??? DISCONTD: insulin regular (NOVOLIN, HUMULIN) 100 Units in 0.9% sodium chloride 100 mL infusion  1-50 Units/hr IntraVENous TITRATE   ??? DISCONTD: insulin regular (NOVOLIN, HUMULIN) injection   IntraVENous TIDPC   ??? DISCONTD: pantoprazole (PROTONIX) tablet 40 mg  40 mg Oral ACB   ??? DISCONTD: 0.9% sodium chloride infusion  100 mL/hr IntraVENous CONTINUOUS   ??? DISCONTD: potassium chloride SR (KLOR-CON 10) tablet 10 mEq  10 mEq Oral DAILY       Recent Results (from the past 24 hour(s))   CK    Collection Time    07/19/11 10:30 AM       Component  Value Range    CK 448 (*) 39 - 308 U/L   TROPONIN I    Collection Time    07/19/11 10:30 AM       Component Value Range    Troponin-I, Qt. 17.72 (*) <0.05 ng/mL   PTT    Collection Time    07/19/11 10:30 AM       Component Value Range    aPTT 67.1 (*) 24.0 - 31.5 sec    aPTT, therapeutic range      58.0 - 77.0 SECS   OCCULT BLOOD, STOOL    Collection Time    07/19/11 10:30 AM       Component Value Range    Occult blood, stool POSITIVE (*) NEGATIVE   GLUCOSE, POC    Collection Time    07/19/11 10:47 AM       Component Value Range    POC GLUCOSE 86  75 - 110 mg/dL    Performed by Kermit Balo     GLUCOSE, POC    Collection Time    07/19/11  5:39 PM       Component Value Range    POC GLUCOSE 314 (*) 75 - 110 mg/dL    Performed by Cyndi Bender     PTT    Collection Time    07/19/11  6:00 PM       Component Value Range    aPTT 47.6 (*) 24.0 - 31.5 sec    aPTT, therapeutic range      58.0 - 77.0 SECS   GLUCOSE, POC    Collection Time    07/19/11  9:16 PM       Component Value Range    POC GLUCOSE 296 (*) 75 - 110 mg/dL    Performed by Juline Patch     PTT    Collection Time    07/19/11 11:30 PM       Component Value Range    aPTT 44.6 (*) 24.0 - 31.5 sec    aPTT, therapeutic range      58.0 - 77.0 SECS   CBC WITH AUTOMATED DIFF    Collection Time    07/20/11  4:20 AM  Component Value Range    WBC 5.3  4.1 - 11.1 K/uL    RBC 3.55 (*) 4.10 - 5.70 M/uL    HGB 10.2 (*) 12.1 - 17.0 g/dL    HCT 16.1 (*) 09.6 - 50.3 %    MCV 85.6  80.0 - 99.0 FL    MCH 28.7  26.0 - 34.0 PG    MCHC 33.6  30.0 - 36.5 g/dL    RDW 04.5 (*) 40.9 - 14.5 %    PLATELET 159  150 - 400 K/uL    NEUTROPHILS 60  32 - 75 %    LYMPHOCYTES 23  12 - 49 %    MONOCYTES 15 (*) 5 - 13 %    EOSINOPHILS 2  0 - 7 %    BASOPHILS 0  0 - 1 %    ABS. NEUTROPHILS 3.2  1.8 - 8.0 K/UL    ABS. LYMPHOCYTES 1.2  0.8 - 3.5 K/UL    ABS. MONOCYTES 0.8  0.0 - 1.0 K/UL    ABS. EOSINOPHILS 0.1  0.0 - 0.4 K/UL    ABS. BASOPHILS 0.0  0.0 - 0.1 K/UL   MAGNESIUM    Collection Time     07/20/11  4:20 AM       Component Value Range    Magnesium 1.5 (*) 1.6 - 2.4 MG/DL   RENAL FUNCTION PANEL    Collection Time    07/20/11  4:20 AM       Component Value Range    Sodium 136  136 - 145 MMOL/L    Potassium 3.3 (*) 3.5 - 5.1 MMOL/L    Chloride 100  97 - 108 MMOL/L    CO2 28  21 - 32 MMOL/L    Anion gap 8  5 - 15 mmol/L    Glucose 78  65 - 100 MG/DL    BUN 29 (*) 6 - 20 MG/DL    Creatinine 8.11 (*) 0.45 - 1.15 MG/DL    BUN/Creatinine ratio 21 (*) 12 - 20      GFR est AA >60  >60 ml/min/1.30m2    GFR est non-AA 50 (*) >60 ml/min/1.33m2    Calcium 7.8 (*) 8.5 - 10.1 MG/DL    Phosphorus 2.6  2.5 - 4.9 MG/DL    Albumin 2.3 (*) 3.5 - 5.0 g/dL   PTT    Collection Time    07/20/11  6:12 AM       Component Value Range    aPTT 76.0 (*) 24.0 - 31.5 sec    aPTT, therapeutic range      58.0 - 77.0 SECS   GLUCOSE, POC    Collection Time    07/20/11  7:53 AM       Component Value Range    POC GLUCOSE 129 (*) 75 - 110 mg/dL    Performed by Aleene Davidson           Assessment and Plan:   1) NSTEMI / CAD   - Mr. Cockerell ruled in for a NSTEMI with peak trop 27   - He had significant ST depression in the lateral leads on arrival.   - This is the second MI Mr. Cauthorn has had in the past couple of months (last one 05/17/11). Cath then showed multivessel CAD including a tight proximal LCX/OM stenosis. I suspect this may be the ischemic territory involved now.   - Tentatively have set up Mr. Jessop for a repeat cardiac cath for Monday. Will  assess any new lesions which may be easily amenable to PCI. If there are no new lesions, Dr. Elmon Kirschner will determine if it would be reasonable to PCI one of the chronic lesions. If Mr. Inabinet needs a stent, would prefer a BMS stent due to his problems with GI bleeding. Will also have to minimize dye use given his CKD. If lesions not amenable to PCI, then continue with medical management.   - continue heparin IV for now. Also on ASA, plavix, statin, NTP, and beta-blocker.   - He has done well  overnight without symptoms  - I will uptitrate his beta-blocker and switch him to a potent statin      2) Anemia  - stool is hemoccult positive   - Appreciate GI Input  - Mr. Villanueva has had multiple GI bleeds (possibly from small bowel AVM's).   - Would recommend trying to keep the Hgb in 9-10 range.   - Continue Procrit and iron replacement going forward   - For now will continue plavix (especially if he will get a stent) - may have to accept a small, slow GI bleed temporarily on Plavix for a few weeks. If all we do is medical management going forward, then may stop the plavix in order to avoid anemia and MI's from supply-demand mismatch.       3) CKD - Cr near his baseline - will need to hydrate pre-cath     4) PVD - followed by Dr. 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

## 2011-07-21 LAB — GLUCOSE, POC
Glucose (POC): 299 mg/dL — ABNORMAL HIGH (ref 75–110)
Glucose (POC): 350 mg/dL — ABNORMAL HIGH (ref 75–110)
Glucose (POC): 298 mg/dL — ABNORMAL HIGH (ref 75–110)
Glucose (POC): 360 mg/dL — ABNORMAL HIGH (ref 75–110)
Glucose (POC): 388 mg/dL — ABNORMAL HIGH (ref 75–110)

## 2011-07-21 LAB — RENAL FUNCTION PANEL
Albumin: 2.5 g/dL — ABNORMAL LOW (ref 3.5–5.0)
Anion gap: 8 mmol/L (ref 5–15)
BUN/Creatinine ratio: 17 (ref 12–20)
BUN: 24 MG/DL — ABNORMAL HIGH (ref 6–20)
CO2: 29 MMOL/L (ref 21–32)
Calcium: 7.5 MG/DL — ABNORMAL LOW (ref 8.5–10.1)
Chloride: 100 MMOL/L (ref 97–108)
Creatinine: 1.45 MG/DL — ABNORMAL HIGH (ref 0.45–1.15)
GFR est AA: 58 mL/min/{1.73_m2} — ABNORMAL LOW (ref >60)
GFR est non-AA: 48 mL/min/{1.73_m2} — ABNORMAL LOW (ref >60)
Glucose: 176 MG/DL — ABNORMAL HIGH (ref 65–100)
Phosphorus: 2.8 MG/DL (ref 2.5–4.9)
Potassium: 4 MMOL/L (ref 3.5–5.1)
Sodium: 137 MMOL/L (ref 136–145)

## 2011-07-21 LAB — CBC W/O DIFF
HCT: 32.5 % — ABNORMAL LOW (ref 36.6–50.3)
HGB: 10.8 g/dL — ABNORMAL LOW (ref 12.1–17.0)
MCH: 28.7 PG (ref 26.0–34.0)
MCHC: 33.2 g/dL (ref 30.0–36.5)
MCV: 86.4 FL (ref 80.0–99.0)
PLATELET: 163 10*3/uL (ref 150–400)
RBC: 3.76 M/uL — ABNORMAL LOW (ref 4.10–5.70)
RDW: 16.4 % — ABNORMAL HIGH (ref 11.5–14.5)
WBC: 6 10*3/uL (ref 4.1–11.1)

## 2011-07-21 LAB — PTT
aPTT: 51.8 s — ABNORMAL HIGH (ref 24.0–31.5)
aPTT: 62.2 s — ABNORMAL HIGH (ref 24.0–31.5)

## 2011-07-21 LAB — MAGNESIUM: Magnesium: 1.8 MG/DL (ref 1.6–2.4)

## 2011-07-21 MED ORDER — SODIUM CHLORIDE 0.9 % IV
INTRAVENOUS | Status: DC
Start: 2011-07-21 — End: 2011-07-22
  Administered 2011-07-22: 14:00:00 via INTRAVENOUS

## 2011-07-21 MED ORDER — DEXTROSE 50% IN WATER (D50W) IV SYRG
INTRAVENOUS | Status: DC | PRN
Start: 2011-07-21 — End: 2011-07-24

## 2011-07-21 MED ORDER — SODIUM CHLORIDE 0.9 % IV
100 unit/mL | INTRAVENOUS | Status: DC
Start: 2011-07-21 — End: 2011-07-23
  Administered 2011-07-21 – 2011-07-23 (×27): via INTRAVENOUS

## 2011-07-21 MED ORDER — ASPIRIN 325 MG TAB
325 mg | Freq: Every day | ORAL | Status: DC
Start: 2011-07-21 — End: 2011-07-21

## 2011-07-21 MED ORDER — INSULIN GLARGINE 100 UNIT/ML INJECTION
100 unit/mL | Freq: Every day | SUBCUTANEOUS | Status: DC
Start: 2011-07-21 — End: 2011-07-21
  Administered 2011-07-21: 14:00:00 via SUBCUTANEOUS

## 2011-07-21 MED ORDER — ASPIRIN 325 MG TAB
325 mg | Freq: Every day | ORAL | Status: DC
Start: 2011-07-21 — End: 2011-07-22
  Administered 2011-07-22: 14:00:00 via ORAL

## 2011-07-21 MED ORDER — INSULIN REGULAR HUMAN 100 UNIT/ML INJECTION
100 unit/mL | Freq: Three times a day (TID) | INTRAMUSCULAR | Status: DC
Start: 2011-07-21 — End: 2011-07-21
  Administered 2011-07-21: 23:00:00 via INTRAVENOUS

## 2011-07-21 MED FILL — NITRO-BID 2 % TRANSDERMAL OINTMENT: 2 % | TRANSDERMAL | Qty: 30

## 2011-07-21 MED FILL — FERROUS SULFATE 325 MG (65 MG ELEMENTAL IRON) TAB: 325 mg (65 mg iron) | ORAL | Qty: 1

## 2011-07-21 MED FILL — LISINOPRIL 20 MG TAB: 20 mg | ORAL | Qty: 1

## 2011-07-21 MED FILL — MISOPROSTOL 200 MCG TAB: 200 mcg | ORAL | Qty: 1

## 2011-07-21 MED FILL — PLAVIX 75 MG TABLET: 75 mg | ORAL | Qty: 1

## 2011-07-21 MED FILL — LIPITOR 20 MG TABLET: 20 mg | ORAL | Qty: 2

## 2011-07-21 MED FILL — INSULIN GLARGINE 100 UNIT/ML INJECTION: 100 unit/mL | SUBCUTANEOUS | Qty: 0.1

## 2011-07-21 MED FILL — FISH OIL 340 MG-1,000 MG CAPSULE: 340-1000 mg | ORAL | Qty: 1

## 2011-07-21 MED FILL — INSULIN LISPRO 100 UNIT/ML INJECTION: 100 unit/mL | SUBCUTANEOUS | Qty: 1

## 2011-07-21 MED FILL — METOPROLOL TARTRATE 25 MG TAB: 25 mg | ORAL | Qty: 1

## 2011-07-21 MED FILL — PROTONIX 40 MG INTRAVENOUS SOLUTION: 40 mg | INTRAVENOUS | Qty: 40

## 2011-07-21 MED FILL — ASPIRIN 81 MG TAB, DELAYED RELEASE: 81 mg | ORAL | Qty: 1

## 2011-07-21 MED FILL — SODIUM CHLORIDE 0.9 % IV: INTRAVENOUS | Qty: 1000

## 2011-07-21 MED FILL — HEPARIN (PORCINE) IN D5W 25,000 UNIT/250 ML IV: 25000 unit/250 mL(100 unit/mL) | INTRAVENOUS | Qty: 250

## 2011-07-21 MED FILL — TERAZOSIN 5 MG CAP: 5 mg | ORAL | Qty: 2

## 2011-07-21 MED FILL — INSULIN REGULAR HUMAN 100 UNIT/ML INJECTION: 100 unit/mL | INTRAMUSCULAR | Qty: 1

## 2011-07-21 MED FILL — LEVOTHYROXINE 150 MCG TAB: 150 mcg | ORAL | Qty: 1

## 2011-07-21 MED FILL — BUMETANIDE 1 MG TAB: 1 mg | ORAL | Qty: 2

## 2011-07-21 NOTE — Progress Notes (Signed)
[place] Progress Note  [facility]   [address] [place], [place] [address]   [phone]      Assessment & Plan:   1. CKD 3:Cr runs between 1.7 to 2.1 mg%   - cr is below his baseline   - IVF prior to contrast in am  - hold bumex for now     2. Anemia    -  stable   - on epo   - s/p transfusion 2 units   3. NSTEMI:Severe CAD   -cath at 10 am  - HE DOES NOT WANT HD in any circumstances   4.DM   5.HTN:139/58   6. CAD   6.PAD          Subjective:   CC:f/up for ARF,CKD  HPI: Patient seen   1. ckd is stable  2. Anemia:stable  3.GE:XBMW for cath monday  ROS:no cp/sob/n/v/abd pain  Current Facility-Administered Medications   Medication Dose Route Frequency   ??? insulin glargine (LANTUS) injection 10 Units  10 Units SubCUTAneous DAILY   ??? 0.9% sodium chloride infusion  75 mL/hr IntraVENous CONTINUOUS   ??? aspirin (ASPIRIN) tablet 325 mg  325 mg Oral DAILY   ??? ferrous sulfate tablet 325 mg  1 Tab Oral BID WITH MEALS   ??? metoprolol (LOPRESSOR) tablet 75 mg  75 mg Oral BID   ??? atorvastatin (LIPITOR) tablet 40 mg  40 mg Oral QHS   ??? epoetin alfa (EPOGEN;PROCRIT) injection 10,000 Units  10,000 Units SubCUTAneous Q7D   ??? insulin lispro (HUMALOG) injection   SubCUTAneous AC&HS   ??? pantoprazole (PROTONIX) injection 40 mg  40 mg IntraVENous Q12H   ??? 0.9% sodium chloride infusion  25 mL/hr IntraVENous CONTINUOUS   ??? glucose chewable tablet 16 g  4 Tab Oral PRN   ??? dextrose (D50W) injection Syrg 12.5-25 g  12.5-25 g IntraVENous PRN   ??? glucagon (GLUCAGEN) injection 1 mg  1 mg IntraMUSCular PRN   ??? levothyroxine (SYNTHROID) tablet 150 mcg  150 mcg Oral ACB   ??? terazosin (HYTRIN) capsule 10 mg  10 mg Oral QHS   ??? lisinopril (PRINIVIL, ZESTRIL) tablet 20 mg  20 mg Oral QHS   ??? misoprostol (CYTOTEC) tablet 200 mcg  200 mcg Oral BID   ??? bumetanide (BUMEX) tablet 2 mg  2 mg Oral DAILY   ??? albuterol (PROVENTIL HFA, VENTOLIN HFA) inhaler 2 Puff  2 Puff Inhalation Q6H PRN    ??? clopidogrel (PLAVIX) tablet 75 mg  75 mg Oral DAILY   ??? oxyCODONE-acetaminophen (PERCOCET 7.5) 7.5-325 mg per tablet 1 Tab  1 Tab Oral Q4H PRN   ??? morphine injection 2 mg  2 mg IntraVENous Q5MIN PRN   ??? zolpidem (AMBIEN) tablet 5 mg  5 mg Oral QHS PRN   ??? acetaminophen (TYLENOL) tablet 650 mg  650 mg Oral Q4H PRN   ??? docusate sodium (COLACE) capsule 100 mg  100 mg Oral BID   ??? bisacodyl (DULCOLAX) suppository 10 mg  10 mg Rectal DAILY PRN   ??? fish oil-omega-3 fatty acids 340-1,000 mg capsule 1 Cap  1 Cap Oral BID   ??? prochlorperazine (COMPAZINE) injection 5 mg  5 mg IntraVENous Q6H PRN          Objective:     Vitals:  Blood pressure 139/58, pulse 84, temperature 98.5 ??F (36.9 ??C), resp. rate 16, height 5\' 6"  (1.676 m), weight 186 lb 3.2 oz (84.46 kg), SpO2 97.00%.  Temp (24hrs), Avg:98.5 ??F (36.9 ??C), Min:98.2 ??F (36.8 ??C), Max:98.9 ??F (37.2 ??C)      Intake and Output:  02/24 0700 - 02/24 1859  In: 544.6 [P.O.:488; I.V.:56.6]  Out: 1000 [Urine:1000]  02/22 1900 - 02/24 0659  In: 2827.6 [P.O.:1560; I.V.:1267.6]  Out: 3800 [Urine:3800]    Physical Exam:                Patient is intubated:  no    Physical Examination:   GENERAL ASSESSMENT: NAD  HEENT:Nontraumatic   CHEST: CTA  HEART: S1S2  ABDOMEN: Soft,NT,  WU:JWJXB: n  EXTREMITY: EDEMA 1  NEURO:Grossly non focal          ECG/rhythm:    Data Review      Recent Labs   Basename 07/18/11 1857    ITNL 2.42*      Recent Labs   Basename 07/19/11 1030 07/19/11 0330 07/19/11 0045    CPK 448* 436* 464*    CKMB -- -- --    TROIQ 17.72* 26.85* 25.38*     Recent Labs   Basename 07/21/11 0150 07/20/11 0420 07/19/11 0330 07/18/11 1840    NA 137 136 140 --    K 4.0 3.3* 3.7 --    CL 100 100 103 --    CO2 29 28 31  --    BUN 24* 29* 35* --    CREA 1.45* 1.39* 1.80* --    GLU 176* 78 61* --    PHOS 2.8 2.6 3.9 --    MG 1.8 1.5* 1.9 --    ALB 2.5* 2.3* -- 2.8*    WBC 6.0 5.3 5.6 --    HGB 10.8* 10.2* 7.1* --    HCT 32.5* 30.4* 21.2* --    PLT 163 159 186 --     No results found for  this basename: INR:3,PTP:3,APTT:3, in the last 72 hours  Needs: urine analysis, urine sodium, protein and creatinine  Lab Results   Component Value Date/Time    Sodium,urine random 59 05/21/2011  9:00 PM    Creatinine,urine random 32.49 05/21/2011  9:00 PM         Discussed with:  Nursing, pt          [facility]  [address]  [place], [place] [address]  Phone - [phone]  Fax - [phone]

## 2011-07-21 NOTE — Progress Notes (Signed)
[place] [facility]  [address] [place], [place] [address]  [phone]      Medical Progress Note      NAME: CLIF SERIO   DOB:  10-Apr-1941  MRM:  [id number]    Date/Time: 07/21/2011  7:17 AM         Subjective:     Chief Complaint:  Feels well.    Pain in foot with walking.  Denies at rest.  Denies chest pain or sob.  No n/v.  Denies dizzy or lightheaded.  Walked around unit yd with [employer] fund[employer].    ROS:  (bold if positive, if negative)              Objective:       Vitals:          Last 24hrs VS reviewed since prior progress note. Most recent are:    Visit Vitals   Item Reading   ??? BP 159/56   ??? Pulse 92   ??? Temp 98.2 ??F (36.8 ??C)   ??? Resp 22   ??? Ht 5\' 6"  (1.676 m)   ??? Wt 186 lb 3.2 oz   ??? BMI 30.05 kg/m2   ??? SpO2 88%     SpO2 Readings from Last 6 Encounters:   07/21/11 88%   05/25/11 97%   11/13/09 98%   11/13/09 98%   11/13/09 98%   10/17/09 100%    O2 Flow Rate (L/min): 2 l/min       Intake/Output Summary (Last 24 hours) at 07/21/11 0716  Last data filed at 07/21/11 2130   Gross per 24 hour   Intake 2513.88 ml   Output   2700 ml   Net -186.12 ml          Exam:     Physical Exam:    Gen:  Well-developed, well-nourished, in no acute distress  HEENT:  Pink conjunctivae, PERRL, hearing intact to voice, moist mucous membranes  Neck:  Supple, without masses, thyroid non-tender  Resp:  No accessory muscle use, rales on right, no wheezes or rhonchi  Card:  No murmurs, normal S1, S2 without thrills or peripheral edema  Abd:  Soft, non-tender, non-distended, normoactive bowel sounds are present  Musc:  No cyanosis or clubbing, ulcer left great toe and 2nd toe with necrotic tissue and odor without drainage  Skin:  No rashes, skin turgor is decreased  Neuro:  Cranial nerves 3-12 are grossly intact, grip strength is 5/5 bilaterally and dorsi / plantarflexion is 5/5 bilaterally, follows commands appropriately  Psych:  Good insight, oriented to person, place and time, alert       Telemetry reviewed:    Sinus rhythm intermittent pacing    Medications Reviewed: (see below)    Lab Data Reviewed: (see below)    ______________________________________________________________________    Medications:     Current Facility-Administered Medications   Medication Dose Route Frequency   ??? magnesium sulfate 2 g/50 ml IVPB (premix or compounded)  2 g IntraVENous ONCE   ??? potassium chloride SR (KLOR-CON 10) tablet 20 mEq  20 mEq Oral NOW   ??? ferrous sulfate tablet 325 mg  1 Tab Oral BID WITH MEALS   ??? metoprolol (LOPRESSOR) tablet 75 mg  75 mg Oral BID   ??? metoprolol (LOPRESSOR) tablet 25 mg  25 mg Oral ONCE   ??? atorvastatin (LIPITOR) tablet 40 mg  40 mg Oral QHS   ??? epoetin alfa (EPOGEN;PROCRIT) injection 10,000 Units  10,000  Units SubCUTAneous Q7D   ??? insulin lispro (HUMALOG) injection   SubCUTAneous AC&HS   ??? pantoprazole (PROTONIX) injection 40 mg  40 mg IntraVENous Q12H   ??? 0.9% sodium chloride infusion  25 mL/hr IntraVENous CONTINUOUS   ??? glucose chewable tablet 16 g  4 Tab Oral PRN   ??? dextrose (D50W) injection Syrg 12.5-25 g  12.5-25 g IntraVENous PRN   ??? glucagon (GLUCAGEN) injection 1 mg  1 mg IntraMUSCular PRN   ??? aspirin delayed-release tablet 81 mg  81 mg Oral DAILY   ??? levothyroxine (SYNTHROID) tablet 150 mcg  150 mcg Oral ACB   ??? terazosin (HYTRIN) capsule 10 mg  10 mg Oral QHS   ??? lisinopril (PRINIVIL, ZESTRIL) tablet 20 mg  20 mg Oral QHS   ??? misoprostol (CYTOTEC) tablet 200 mcg  200 mcg Oral BID   ??? bumetanide (BUMEX) tablet 2 mg  2 mg Oral DAILY   ??? albuterol (PROVENTIL HFA, VENTOLIN HFA) inhaler 2 Puff  2 Puff Inhalation Q6H PRN   ??? clopidogrel (PLAVIX) tablet 75 mg  75 mg Oral DAILY   ??? nitroglycerin (NITROBID) 2 % ointment 1 Inch  1 Inch Topical Q6H   ??? heparin 25,000 units in D5W 250 ml infusion  12-25 Units/kg/hr IntraVENous TITRATE   ??? oxyCODONE-acetaminophen (PERCOCET 7.5) 7.5-325 mg per tablet 1 Tab  1 Tab Oral Q4H PRN   ??? morphine injection 2 mg  2 mg IntraVENous Q5MIN PRN   ??? zolpidem (AMBIEN) tablet 5  mg  5 mg Oral QHS PRN   ??? acetaminophen (TYLENOL) tablet 650 mg  650 mg Oral Q4H PRN   ??? docusate sodium (COLACE) capsule 100 mg  100 mg Oral BID   ??? bisacodyl (DULCOLAX) suppository 10 mg  10 mg Rectal DAILY PRN   ??? fish oil-omega-3 fatty acids 340-1,000 mg capsule 1 Cap  1 Cap Oral BID   ??? prochlorperazine (COMPAZINE) injection 5 mg  5 mg IntraVENous Q6H PRN            Lab Review:     Recent Labs   Basename 07/21/11 0150 07/20/11 0420 07/19/11 0330    WBC 6.0 5.3 5.6    HGB 10.8* 10.2* 7.1*    HCT 32.5* 30.4* 21.2*    PLT 163 159 186     Recent Labs   Basename 07/21/11 0150 07/20/11 0420 07/19/11 0330 07/18/11 1840    NA 137 136 140 --    K 4.0 3.3* 3.7 --    CL 100 100 103 --    CO2 29 28 31  --    GLU 176* 78 61* --    BUN 24* 29* 35* --    CREA 1.45* 1.39* 1.80* --    CA 7.5* 7.8* 8.1* --    MG 1.8 1.5* 1.9 --    PHOS 2.8 2.6 3.9 --    ALB 2.5* 2.3* -- 2.8*    TBIL -- -- -- 0.7    SGOT -- -- -- 28    INR -- -- -- --     Lab Results   Component Value Date/Time    POC GLUCOSE 299 07/20/2011  8:47 PM    POC GLUCOSE 350 07/20/2011  4:56 PM    POC GLUCOSE 209 07/20/2011 12:17 PM    POC GLUCOSE 129 07/20/2011  7:53 AM    POC GLUCOSE 296 07/19/2011  9:16 PM            Assessment / Plan:  DM (05/16/2011):  Insulin gtt stopped for hypoglycemia.  Glucose now trending upward and remains elevated.   -- continue SSI    -- restart lantus, low dose for now    NSTEMI (non-ST elevated myocardial infarction) / CAD (coronary artery disease) / HTN / NSVT / Chronic diastolic and systolic CHF:  Appreciate cardiology evaluation.  Weight stable.   -- continue on heparin gtt    -- continue ASA, Plavix, lisinopril, metoprolol   -- possible cath on Monday   -- continue bumex   -- daily weight    Anemia, unspecified (09/29/2009):  S/p transfusion.  Stable hgb post transfusion.   -- continue iron and procrit   -- monitor hgb     Hyperlipidemia (10/06/2009)    -- continue statin and fish oil     Unspecified hypothyroidism (05/16/2011)    --  continue LT4    PAD (peripheral artery disease) (05/18/2011) / Foot ulcer (07/18/2011):  Appreciate wound care eval.  Xray without radiologic evid for osteo.   -- continue local wound care   -- ID eval    CKD (chronic kidney disease) stage 3, GFR 30-59 ml/min (07/18/2011):  Creat improved overnight.  Appreciate renal eval.   -- follow creat    Total time spent with patient: 25 Minutes                  Care Plan discussed with: Patient and Nursing Staff    Discussed:  Care Plan    Prophylaxis:  Hep IV and H2B/PPI    Disposition:  Home w/Family           ___________________________________________________    Attending Physician: Suzan Garibaldi, MD

## 2011-07-21 NOTE — Progress Notes (Signed)
Shelda Altes, MD, [facility]  [place] [facility] [place] [facility]  [address]  Office [phone]      IMPRESSION and RECOMMENDATIONS     1.  CAD:  Stable now.  For cath tomorrow.  LCx and RCA chronically occluded, and would not pursue PCI of these vessel, especially if DES is not an option.  If there is a new lesion (e.g., LAD), will probably PCI with DES.  The risks (including but not limited to death, myocardial infarction, cerebrovascular accident, dysrhythmia, renal failure, vascular complication, allergy, and/or need for emergency surgery), benefits, and alternatives have been explained.  Verbal informed consent has been obtained.  D/C NTP  I have discussed this plan with the patient.  He appears to understand this plan and wishes to proceed ahead.    Cath at 10am.                              Subjective:       Pt without complaints.  Feel back to baseline.        Objective:   Patient Vitals for the past 16 hrs:   BP Temp Pulse Resp SpO2 Height Weight   07/21/11 0700 - 98.2 ??F (36.8 ??C) - - - - -   07/21/11 0653 159/56 mmHg 98.2 ??F (36.8 ??C) 92  22  88 % 5\' 6"  (1.676 m) 186 lb 3.2 oz (84.46 kg)   07/21/11 0343 146/57 mmHg 98.6 ??F (37 ??C) 74  21  96 % - -   07/21/11 0300 - - 79  19  - - -   07/21/11 0200 - - 78  23  - - -   07/21/11 0150 - - 79  22  - - -   07/21/11 0100 - - 77  19  - - -   07/21/11 0000 147/59 mmHg - 75  14  95 % - -   07/20/11 2327 141/58 mmHg 98.7 ??F (37.1 ??C) 83  22  97 % - -   07/20/11 2300 - - 75  22  94 % - -   07/20/11 2225 - - 84  24  98 % - -   07/20/11 2200 147/56 mmHg - 75  23  97 % - -   07/20/11 2148 153/66 mmHg - 73  - - - -   07/20/11 2117 153/66 mmHg - 83  - - - -   07/20/11 2100 153/66 mmHg - 88  22  97 % - -   07/20/11 2012 - - 86  - 99 % - -   07/20/11 2000 - - 82  21  97 % - -   07/20/11 1939 - - - - 93 % - -   07/20/11 1900 146/64 mmHg 98.9 ??F (37.2 ??C) 72  23  95 % - -   07/20/11 1652 149/69 mmHg 98.2 ??F (36.8 ??C) 94  23  98 % - -        HEENT Exam:     WNL         Lung Exam:     The patient is not dyspneic.Breath sounds are heard equally in all lung fields. There are no wheezes, rales, rhonchi, or rubs heard on auscultation.          Heart Exam:     The rhythm is regular. The PMI is in the 5th intercostal space of the MCL. Apical impulse is normal. S1 is regular.  S2 is physiologic. There is no S3, S4 gallop, murmur, click, or rub.          Abdomen Exam:     Benign.         Extremities Exam:     No cyanosis, clubbing, edema.  Distal pulses intact.           Lab Results   Component Value Date/Time    Glucose 176 07/21/2011  1:50 AM    Sodium 137 07/21/2011  1:50 AM    Potassium 4.0 07/21/2011  1:50 AM    Chloride 100 07/21/2011  1:50 AM    CO2 29 07/21/2011  1:50 AM    BUN 24 07/21/2011  1:50 AM    Creatinine 1.45 07/21/2011  1:50 AM    Calcium 7.5 07/21/2011  1:50 AM     Recent Labs   Basename 07/21/11 0150 07/20/11 0420    WBC 6.0 5.3    HGB 10.8* 10.2*    HCT 32.5* 30.4*    PLT 163 159     Recent Labs   Basename 07/21/11 0150 07/20/11 0420 07/18/11 1840    SGOT -- -- 28    GPT -- -- 16    AP -- -- 88    TBIL -- -- 0.7    TP -- -- 8.4*    ALB 2.5* 2.3* --    GLOB -- -- 5.6*    GGT -- -- --     No results found for this basename: INR:2,PTP:2,APTT:2, in the last 72 hours   Recent Labs   Basename 07/19/11 1030 07/19/11 0330 07/18/11 1857    CPK 448* 436* --    CKMB -- -- --    ITNL -- -- 2.42*     Recent Labs   Basename 07/19/11 1030    TROIQ 17.72*     Lab Results   Component Value Date/Time    Cholesterol, total 123 05/17/2011  2:22 AM    HDL Cholesterol 55 05/17/2011  2:22 AM    LDL, calculated 58.4 05/17/2011  2:22 AM    Triglyceride 48 05/17/2011  2:22 AM    CHOL/HDL Ratio 2.2 05/17/2011  1:61 AM

## 2011-07-21 NOTE — Progress Notes (Addendum)
1908 Bedside and Verbal shift change report given to Juline Patch RN (oncoming nurse) by Cyndi Bender (offgoing nurse).  Report given with SBAR, Intake/Output, MAR and Recent Results.   2010 BG=110 changed  to 1.0 mg/dl  1610 BG =96 changed to 0.3 units hr. Gave snack.  2230 For BG 98 held insulin drip for 1 hour.  2334 Blood Glucose was 118  Held insulin drip per [employer].  0038 Blood Glucose = 152. Continue to hold per [employer].  0145 Blood glucose was 207 mg/dl. Increased insulin drip to 1.5 units /hr  0249 Blood glucose of 200 mg/dl. Increased insulin drip to 2.8 units /hr  0352 Blood glucose was 159 mg/dl de decrease creased insulin drip to 2 units/hr  0430 blood drawn from right arm and sent to lab  0456 blood glucose was 145 mg/dl decrease rate to 1.7 units/hr  0730 blood glucose 68 mg/dl gave 13 ml E45  4098 rechecked Glucose was 99 changed rate to 0.4 ml/hr  0850 Bedside shift change report given to Burna Sis RN (oncoming nurse) by Juline Patch RN (offgoing nurse).  Report given with SBAR and Recent Results.

## 2011-07-21 NOTE — Progress Notes (Addendum)
Assumed care of pt. VSS. No c/o pain. Dressing left foot intact.    1000 - Dr. Elmon Kirschner in to see pt. Heparin drip stopped. To be cathed in am.    1200 - Call to Dr. Selena Batten. Pt's BS 360. Given Lantus earlier. Order received to give 12 units. Will reassess with 1630 blood sugar.    1645 - Call to Dr. Selena Batten. Pt's blood sugar 388.    1715 - Dr. Selena Batten returned call. Order received to restart insulin drip. Will carry order out.    1750 - Insulin drip started per order. Pt not happy with start of drip but will allow it. Consents signed for cath.    1800 - Call to Dr. Genevie Ann, ID. Consult ordered yesterday but I did not notice any notes or orders. Called MD to verify that she was aware of consult. She was not. Will see pt in am.

## 2011-07-22 LAB — GLUCOSE, POC
Glucose (POC): 110 mg/dL (ref 75–110)
Glucose (POC): 118 mg/dL — ABNORMAL HIGH (ref 75–110)
Glucose (POC): 142 mg/dL — ABNORMAL HIGH (ref 75–110)
Glucose (POC): 145 mg/dL — ABNORMAL HIGH (ref 75–110)
Glucose (POC): 152 mg/dL — ABNORMAL HIGH (ref 75–110)
Glucose (POC): 159 mg/dL — ABNORMAL HIGH (ref 75–110)
Glucose (POC): 183 mg/dL — ABNORMAL HIGH (ref 75–110)
Glucose (POC): 200 mg/dL — ABNORMAL HIGH (ref 75–110)
Glucose (POC): 203 mg/dL — ABNORMAL HIGH (ref 75–110)
Glucose (POC): 207 mg/dL — ABNORMAL HIGH (ref 75–110)
Glucose (POC): 231 mg/dL — ABNORMAL HIGH (ref 75–110)
Glucose (POC): 250 mg/dL — ABNORMAL HIGH (ref 75–110)
Glucose (POC): 259 mg/dL — ABNORMAL HIGH (ref 75–110)
Glucose (POC): 68 mg/dL — ABNORMAL LOW (ref 75–110)
Glucose (POC): 81 mg/dL (ref 75–110)
Glucose (POC): 88 mg/dL (ref 75–110)
Glucose (POC): 89 mg/dL (ref 75–110)
Glucose (POC): 98 mg/dL (ref 75–110)
Glucose (POC): 99 mg/dL (ref 75–110)

## 2011-07-22 LAB — RENAL FUNCTION PANEL
Albumin: 2.3 g/dL — ABNORMAL LOW (ref 3.5–5.0)
Anion gap: 8 mmol/L (ref 5–15)
BUN/Creatinine ratio: 19 (ref 12–20)
BUN: 20 MG/DL (ref 6–20)
CO2: 28 MMOL/L (ref 21–32)
Calcium: 7.4 MG/DL — ABNORMAL LOW (ref 8.5–10.1)
Chloride: 103 MMOL/L (ref 97–108)
Creatinine: 1.03 MG/DL (ref 0.45–1.15)
GFR est AA: >60 mL/min/{1.73_m2} (ref >60)
GFR est non-AA: >60 mL/min/{1.73_m2} (ref >60)
Glucose: 136 MG/DL — ABNORMAL HIGH (ref 65–100)
Phosphorus: 2.8 MG/DL (ref 2.5–4.9)
Potassium: 3.5 MMOL/L (ref 3.5–5.1)
Sodium: 139 MMOL/L (ref 136–145)

## 2011-07-22 LAB — CBC W/O DIFF
HCT: 32.6 % — ABNORMAL LOW (ref 36.6–50.3)
HGB: 10.7 g/dL — ABNORMAL LOW (ref 12.1–17.0)
MCH: 28.3 PG (ref 26.0–34.0)
MCHC: 32.8 g/dL (ref 30.0–36.5)
MCV: 86.2 FL (ref 80.0–99.0)
PLATELET: 170 10*3/uL (ref 150–400)
RBC: 3.78 M/uL — ABNORMAL LOW (ref 4.10–5.70)
RDW: 16.4 % — ABNORMAL HIGH (ref 11.5–14.5)
WBC: 4.8 10*3/uL (ref 4.1–11.1)

## 2011-07-22 LAB — PTT: aPTT: 35.2 s — ABNORMAL HIGH (ref 24.0–31.5)

## 2011-07-22 MED ORDER — MIDAZOLAM 1 MG/ML IJ SOLN
1 mg/mL | INTRAMUSCULAR | Status: DC | PRN
Start: 2011-07-22 — End: 2011-07-22
  Administered 2011-07-22 (×2): via INTRAVENOUS

## 2011-07-22 MED ORDER — SODIUM CHLORIDE 0.9 % IV
INTRAVENOUS | Status: AC
Start: 2011-07-22 — End: 2011-07-22
  Administered 2011-07-22: 17:00:00 via INTRAVENOUS

## 2011-07-22 MED ORDER — IOVERSOL 350 MG/ML IV SOLN
350 mg iodine/mL | INTRAVENOUS | Status: DC | PRN
Start: 2011-07-22 — End: 2011-07-22
  Administered 2011-07-22: 16:00:00 via INTRA_ARTERIAL

## 2011-07-22 MED ORDER — MORPHINE 10 MG/ML INJ SOLUTION
10 mg/ml | INTRAMUSCULAR | Status: DC | PRN
Start: 2011-07-22 — End: 2011-07-24

## 2011-07-22 MED ORDER — ISOSORBIDE MONONITRATE SR 30 MG 24 HR TAB
30 mg | Freq: Every day | ORAL | Status: DC
Start: 2011-07-22 — End: 2011-07-24
  Administered 2011-07-22 – 2011-07-24 (×3): via ORAL

## 2011-07-22 MED ORDER — FENTANYL CITRATE (PF) 50 MCG/ML IJ SOLN
50 mcg/mL | INTRAMUSCULAR | Status: DC | PRN
Start: 2011-07-22 — End: 2011-07-22
  Administered 2011-07-22 (×2): via INTRAVENOUS

## 2011-07-22 MED ORDER — PROMETHAZINE 25 MG/ML INJECTION
25 mg/mL | INTRAMUSCULAR | Status: DC | PRN
Start: 2011-07-22 — End: 2011-07-22

## 2011-07-22 MED ORDER — LIDOCAINE HCL 1 % (10 MG/ML) IJ SOLN
10 mg/mL (1 %) | INTRAMUSCULAR | Status: DC | PRN
Start: 2011-07-22 — End: 2011-07-22
  Administered 2011-07-22: 15:00:00 via SUBCUTANEOUS

## 2011-07-22 MED ORDER — HEPARIN (PORCINE) IN NS (PF) 1,000 UNIT/500 ML IV
1000 unit/500 mL | Freq: Once | INTRAVENOUS | Status: AC
Start: 2011-07-22 — End: 2011-07-22
  Administered 2011-07-22: 15:00:00 via INTRA_ARTERIAL

## 2011-07-22 MED ORDER — ACETAMINOPHEN 325 MG TABLET
325 mg | ORAL | Status: DC | PRN
Start: 2011-07-22 — End: 2011-07-24

## 2011-07-22 MED ORDER — DIPHENHYDRAMINE HCL 50 MG/ML IJ SOLN
50 mg/mL | INTRAMUSCULAR | Status: DC | PRN
Start: 2011-07-22 — End: 2011-07-24

## 2011-07-22 MED ORDER — ONDANSETRON (PF) 4 MG/2 ML INJECTION
4 mg/2 mL | Freq: Once | INTRAMUSCULAR | Status: DC | PRN
Start: 2011-07-22 — End: 2011-07-24

## 2011-07-22 MED ORDER — FAMOTIDINE (PF) 20 MG/2 ML IV
20 mg/2 mL | Freq: Once | INTRAVENOUS | Status: DC | PRN
Start: 2011-07-22 — End: 2011-07-24

## 2011-07-22 MED ORDER — HEPARIN (PORCINE) IN NS (PF) 1,000 UNIT/500 ML IV
1000 unit/500 mL | Freq: Once | INTRAVENOUS | Status: AC
Start: 2011-07-22 — End: 2011-07-22
  Administered 2011-07-22: 15:00:00

## 2011-07-22 MED ORDER — HYDROCORTISONE SOD SUCCINATE (PF) 100 MG/2 ML SOLUTION FOR INJECTION
100 mg/2 mL | Freq: Once | INTRAMUSCULAR | Status: DC | PRN
Start: 2011-07-22 — End: 2011-07-24

## 2011-07-22 MED ORDER — PROCHLORPERAZINE EDISYLATE 5 MG/ML INJECTION
5 mg/mL | INTRAMUSCULAR | Status: DC | PRN
Start: 2011-07-22 — End: 2011-07-24

## 2011-07-22 MED FILL — LEVOTHYROXINE 150 MCG TAB: 150 mcg | ORAL | Qty: 1

## 2011-07-22 MED FILL — HEPARIN (PORCINE) IN NS (PF) 1,000 UNIT/500 ML IV: 1000 unit/500 mL | INTRAVENOUS | Qty: 500

## 2011-07-22 MED FILL — ISOSORBIDE MONONITRATE SR 30 MG 24 HR TAB: 30 mg | ORAL | Qty: 1

## 2011-07-22 MED FILL — PROTONIX 40 MG INTRAVENOUS SOLUTION: 40 mg | INTRAVENOUS | Qty: 40

## 2011-07-22 MED FILL — METOPROLOL TARTRATE 25 MG TAB: 25 mg | ORAL | Qty: 1

## 2011-07-22 MED FILL — TERAZOSIN 5 MG CAP: 5 mg | ORAL | Qty: 2

## 2011-07-22 MED FILL — GLUCAGEN DIAGNOSTIC KIT 1 MG/ML INJECTION: 1 mg/mL | INTRAMUSCULAR | Qty: 1

## 2011-07-22 MED FILL — GLUCOSE 4 GRAM CHEWABLE TAB: 4 gram | ORAL | Qty: 10

## 2011-07-22 MED FILL — MISOPROSTOL 200 MCG TAB: 200 mcg | ORAL | Qty: 1

## 2011-07-22 MED FILL — LISINOPRIL 20 MG TAB: 20 mg | ORAL | Qty: 1

## 2011-07-22 MED FILL — FERROUS SULFATE 325 MG (65 MG ELEMENTAL IRON) TAB: 325 mg (65 mg iron) | ORAL | Qty: 1

## 2011-07-22 MED FILL — LIDOCAINE HCL 1 % (10 MG/ML) IJ SOLN: 10 mg/mL (1 %) | INTRAMUSCULAR | Qty: 40

## 2011-07-22 MED FILL — FISH OIL 340 MG-1,000 MG CAPSULE: 340-1000 mg | ORAL | Qty: 1

## 2011-07-22 MED FILL — PLAVIX 75 MG TABLET: 75 mg | ORAL | Qty: 1

## 2011-07-22 MED FILL — SODIUM CHLORIDE 0.9 % IV: INTRAVENOUS | Qty: 1000

## 2011-07-22 MED FILL — ASPIRIN 325 MG TAB: 325 mg | ORAL | Qty: 1

## 2011-07-22 MED FILL — OXYCODONE-ACETAMINOPHEN 7.5 MG-325 MG TAB: ORAL | Qty: 1

## 2011-07-22 MED FILL — DEXTROSE 50% IN WATER (D50W) IV SYRG: INTRAVENOUS | Qty: 50

## 2011-07-22 MED FILL — MIDAZOLAM 1 MG/ML IJ SOLN: 1 mg/mL | INTRAMUSCULAR | Qty: 5

## 2011-07-22 MED FILL — OPTIRAY 350 MG IODINE/ML INTRAVENOUS SOLUTION: 350 mg iodine/mL | INTRAVENOUS | Qty: 150

## 2011-07-22 MED FILL — FENTANYL CITRATE (PF) 50 MCG/ML IJ SOLN: 50 mcg/mL | INTRAMUSCULAR | Qty: 4

## 2011-07-22 MED FILL — LIPITOR 20 MG TABLET: 20 mg | ORAL | Qty: 2

## 2011-07-22 NOTE — Progress Notes (Signed)
[place] [facility]  [address] [place], [place] [address]  [phone]          GI PROGRESS NOTE    NAME: Travis [name]   DOB:  [birth year]   MRN:  [id number]       Subjective:   Denies black or bloody stools.  Denies abdominal pain, constipation or diarrhea.  Denies chest pain or shortness of breath.      Objective:     VITALS:   Last 24hrs VS reviewed since prior progress note. Most recent are:  Visit Vitals   Item Reading   ??? BP 156/56   ??? Pulse 83   ??? Temp 99.1 ??F (37.3 ??C)   ??? Resp 22   ??? Ht 5\' 6"  (1.676 m)   ??? Wt 85.004 kg (187 lb 6.4 oz)   ??? BMI 30.25 kg/m2   ??? SpO2 95%       PHYSICAL EXAM:  General: Cooperative, no acute distress????  Neurologic:?? Alert and oriented X 3.  HEENT: PERRLA, EOMI.   Lungs:  LLL with coarse breath sounds.  No rales or wheezing.  Heart:  s1 s2, Regular  rhythm,?? No murmur   Abdomen: Soft, Non distended, Non tender. ??Bowel sounds normal. No guarding or rebound.  No hepatosplenomegaly.  Extremities: No edema  Psych:???? Poor eye contact.  Does not speak unless spoken to and offers very little information.  Not anxious nor agitated.  Sighs a lot and appears frustrated.    Lab Data Reviewed:   Results for JORIAN, WILLHOITE (MRN [id number]) as of 07/22/2011 08:25   07/19/2011 03:30 07/20/2011 04:20 07/21/2011 01:50 07/22/2011 04:30   HGB 7.1 (L) 10.2 (L) 10.8 (L) 10.7 (L)   HCT 21.2 (L) 30.4 (L) 32.5 (L) 32.6 (L)         ________________________________________________________________________       Assessment:   ?? Anemia and heme positive stool in a pt with a history of AVM's with stable Hgb.  ?? Non STEMI, CAD, HTN  ?? Diabetes mellitus  ?? CKD stage 3  ?? Dyslipidemia     Plan:   ?? Continue as is on Plavix, ASA and Heparin.  No plans for scopes unless he has frank bleeding and/or evidence of hemodynamic instability secondary to GIB.     Signed By: Lula Olszewski, NP     07/22/2011  8:26 AM

## 2011-07-22 NOTE — Other (Signed)
Cardiopulmonary Rehab:  Follow-up with patient.  Printed teaching materials on coronary artery disease, post cardiac cath care and Plavix provided for patient.  Patient resting post cath.  Will follow for teaching as appropriate.

## 2011-07-22 NOTE — Other (Cosign Needed Addendum)
Cardiac  Quality Team    07/22/2011  07/24/2011 07/24/2011    Reason for discussing patient in Cardiac MDR:    NSTEMI                                                Attendance:    07/22/2011   X NP/MD   X Cardiac rehab   X RN X Care management                                      07/24/2011  X NP/MD   X Cardiac rehab   X RN X Care management      Patient Information:  NAME:   Travis [name]  AGE:   71 y.o.      PCP: Phys Other, MD                                                    ADMISSION DATE:   07/18/2011    LOS:    ADMITTING PHYSICIAN:   Marijo File, MD    *CARDIOLOGIST: Dr. Welton Flakes              CODE STATUS:  Full        ADVANCED DIRECTIVES: Y   N      Admit Weight:            07/24/2011   Wt Readings from Last 3 Encounters:   07/22/11 187 lb 6.4 oz (85.004 kg)   05/25/11 200 lb 8 oz (90.946 kg)   03/11/11 195 lb (88.451 kg)   07/24/2011  188.3      ________________________________________________________________________  Travis [name] is a 71 y.o. year old male w/ hx: CAD, DM, CKD, HTN,  Chronic HF- D and S, left toe gangrene, PA HTN   Pw: NSTEMI peak trop 26     CARDIAC EVALUATION:   CATH: 2003 and 2006 - chronic LCX occlusion  STRESS: 02/09/07. The type of test was a dobutamine stress perfusion imaging. Cardiac stress testing was negative for chest pain and myocardial ischemia. Myocardial perfusion imaging showed reversible defect(s) involving the inferior wall. Gated EF (%): 55.   ECHO: 01/19/07. Echocardiogram demonstrated normal LV wall motion and ejection fraction, LVH and left atrial enlargement.   CAROTID: 2009 bilateral 50-79% stenosis.   CAROTIDS: 07/08/08 - 50-79% stenosis bilat  ECHO: 04/03/09 - TDS, moderate LVH, LVEF 65%; moderate LAE, RAE, mild AS (mean gradient 10), mild MR  ECHO: 1/11 - mild LVH, LAE, EF 55%, mild Pulm HTN, mild MR  CATH:06/05/09- DES to RCA  CATH: 06/22/09 - LAD 50%, LCX 100%, mid RCA patent stent, distal RCA 40%  ECHO: 06/22/09 - mild LVH, EF 55-60%, mild LAE, mild MR/TR, PASP 60   CATH: 07/18/09 - patent stent and stable findings; EF 60%  CATH: 10/16/09 - 85% ulcerated plaque mid RCA successfully stented with 2.75 Xience. CTO Cx successful PTCA and DES of proximal part with 2.25 Atom.   CATH: 05/20/11: d LM: 30 %. pLAD 30%. dLAD 80%. mLCX 100% prior to stent. OM1:80%. mRCA:100 % at the site of a prior stent.- Med tx  ECHO: 07/17/11: EF: 45%, mod LVH, moderate HK basal-mid inferoseptal and  basal-mid inferior wall(s). Grade 2 DD, LAE.   CATH: 2/25/2013Obstructive 3VD:   LM d30.    LAD m30, apical 70.    LCx m100 (chronic); OM1 ost80.   RCA p80 (diffuse), m100 (chronic).  EF 40% with basal-mid inferior AK.  No AVG, MR 1+           ___________________________________________________________________    07/22/2011     BP 156/56   Pulse 83   Temp 99.1 ??F (37.3 ??C)   Resp 22   Ht 5\' 6"  (1.676 m)   Wt 187 lb 6.4 oz (85.004 kg)   BMI 30.25 kg/m2   SpO2 95%  07/24/2011  BP 170/64   Pulse 89   Temp 98.8 ??F (37.1 ??C)   Resp 19   Ht 5\' 6"  (1.676 m)   Wt 189 lb (85.73 kg)   BMI 30.51 kg/m2   SpO2 98%   Readmit Score: 9  Prior Hospital Admissions  Patient Been Admitted to the Hospital Prior to this Encounter: Yes (greater than 30 days)  Name of the Hosptial(s): [facility]  Why Were You in the Hospital?: heart attack  Specialist Care: Yes (comment) (cardiology)  Readmit Risk Tool  Scheduled Admission: No  Living Alone: No  Caregivers/Community Support: No  History of Mental Illness: No  Polypharmacy (Greater Than 7 Home Meds): Yes  Currently Prescribed : Insulin  Requires Financial, Physical and/or Educational Assistance With Medications: No  Independent with ADLs: Yes  Needs Assistance with Wound Care AND/OR Mgnt of O2, Nebulizer: No  Clinically Complex: No  History of Falls Within Past 3 Months: No  Chronic Medical Condition: Yes  Patient Directs Own Care: Yes  Readmit RISK Tool Score: 9                     __________________________________________________________________    Patient / Family Goals For Today:     07/22/2011: cath, adjust meds,   07/24/2011: dc home     ___________________________________________________________________   Date:07/22/2011   Cardiac Rehab education: cath, CAD   ___________________________________________________________________    CORE MEASURE MEDICATIONS : (include dosage with meds as indicated below)    *ACE/ARB if EF <40%:   Lisinopril                                        If none, has MD documented why?      *Beta Blocker :lopressor                                       If none, has MD documented why?      ASA :   plavix   *ASA on arrival ( if AMI ) :    Yes                                         If AMI and none given,has MD documented why?         Statin :   lipitor , fish oil      If AMI and none ordered, has MD documented why?     I___________________________________________________________________  Lab Results   Component  Value Date/Time    Cholesterol, total 123 05/17/2011  2:22 AM    HDL Cholesterol 55 05/17/2011  2:22 AM    LDL, calculated 58.4 05/17/2011  2:22 AM    Triglyceride 48 05/17/2011  2:22 AM    CHOL/HDL Ratio 2.2 05/17/2011  0:86 AM       CMP:   Lab Results   Component Value Date/Time    NA 137 07/24/2011  3:39 AM    K 4.1 07/24/2011  3:39 AM    CL 101 07/24/2011  3:39 AM    CO2 27 07/24/2011  3:39 AM    AGAP 9 07/24/2011  3:39 AM    GLU 257* 07/24/2011  3:39 AM    BUN 19 07/24/2011  3:39 AM    CREA 1.10 07/24/2011  3:39 AM    GFRAA >60 07/24/2011  3:39 AM    GFRNA >60 07/24/2011  3:39 AM    CA 8.7 07/24/2011  3:39 AM    PHOS 4.8 07/24/2011  3:39 AM    ALB 2.5* 07/24/2011  3:39 AM       CBC:   No results found for this basename: wbc, hgb, hct, plt       _______________________________________________________________  GI PX:  PPI        DVT PX:     Y      Type:      Heparin                 Lines & Drains    a) Central Line:   No                 b) Foley Catheter:       No                     Placement Date:     Clinical reason for continuing foley :       ______________________________________________________________________  Discharge Planning:  a) Pt Admitted From: Home   b) Health care partner: lives alone, significant other  LPN,   C) Discharge to: Home with Home Health Advanced care- DM, wound, HF   D) Out patient cardiac rehab: N            E) Pt has a scale     ______________________________________________________________________  Vaccination Information:    Refused flu   Immunization History   Administered Date(s) Administered   ??? Pneumococcal Vaccine 05/23/2011         History of Smoking within the past year?:       N                        Smoking Cessation Education Provided and documented:       History     Social History   ??? Marital Status: SINGLE     Spouse Name: N/A     Number of Children: N/A   ??? Years of Education: N/A     Occupational History   ??? Not on file.     Social History Main Topics   ??? Smoking status: Former Smoker -- 20 years     Types: Cigarettes     Quit date: 11/09/1978   ??? Smokeless tobacco: Never Used   ??? Alcohol Use: No   ??? Drug Use: No   ??? Sexually Active: Not on file     Other Topics Concern   ??? Not on file  Social History Narrative   ??? No narrative on file       ______________________________________________________________________

## 2011-07-22 NOTE — Consults (Signed)
Infectious Disease Consult    Subjective:   Date of Consultation:  July 22, 2011  Date of Admission: 07/18/2011   Referring Physician: Dr Selena Batten    Patient Travis [name] a 71 y.o. male who Travis [name] being seen for foot ulcer   Travis Travis [name] a 71 y.o. African American male who was admitted for NSTEMI on 07/18/11. He has CKD, DM and PAD.  He refers chronic Left foot wound for at least a month , denies fever or chills. He Travis [name] followed by vascular surgery who Travis [name] planning arteriogram and eventually toe debridement/ amputation.  He Travis [name] on not antibiotics/ remains afebrile with norma WBC        Patient Active Problem List   Diagnoses Code   ??? CAD (coronary artery disease) 414.00   ??? Anemia, unspecified 285.9   ??? Back pain 724.5   ??? Hyperlipidemia 272.4   ??? HTN (hypertension) 401.9   ??? NSTEMI (non-ST elevated myocardial infarction) 410.70   ??? DKA, type 2, not at goal 250.12   ??? Unspecified hypothyroidism 244.9   ??? PAD (peripheral artery disease) 447.9   ??? Ischemic toe ulcer 707.15   ??? CKD (chronic kidney disease) stage 3, GFR 30-59 ml/min 585.3   ??? Foot ulcer 707.15     Past Medical History   Diagnosis Date   ??? CAD (coronary artery disease)      see problem list   ??? HTN (hypertension)    ??? PVD (peripheral vascular disease)    ??? DM (diabetes mellitus)    ??? Hyperlipidemia    ??? Sleep apnea      on CPAP   ??? COPD (chronic obstructive pulmonary disease)      mild COPD, recurrent pneumonia, chronic bronchitis (followed by Dr. Leroy Libman)   ??? CKD (chronic kidney disease)      Cr 1.7 in 10/09   ??? CHF (congestive heart failure)    ??? Anemia    ??? Pulmonary hypertension      PASP 60 on echo 06/22/09   ??? Thromboembolus      leg    ??? Thyroid disease    ??? Pneumonia    ??? GERD (gastroesophageal reflux disease)    ??? Chronic pain      back pain   ??? Cataract    ??? GI bleed      multiple bleeding episodes and blood transfusions.  Dr. Monika Salk has followed..  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   ??? Foot ulcer 07/18/2011      Family History   Problem Relation Age of Onset   ??? Hypertension      ??? Cancer Mother      cervical and colon   ??? Cancer Father      lung      History   Substance Use Topics   ??? Smoking status: Former Smoker -- 20 years     Types: Cigarettes     Quit date: 11/09/1978   ??? Smokeless tobacco: Never Used   ??? Alcohol Use: No     Past Surgical History   Procedure Date   ??? Hx orthopaedic      knee replacement   ??? Pr vascular surgery procedure unlist      arterial bypass   ??? Hx pacemaker    ??? Pr cardiac surg procedure unlist      cardiac stent   ??? Hx cataract removal      removed rt eye      Current Facility-Administered Medications  Medication Dose Route Frequency Last Dose   ??? 0.9% sodium chloride infusion  75 mL/hr IntraVENous CONTINUOUS     ??? aspirin (ASPIRIN) tablet 325 mg  325 mg Oral DAILY     ??? insulin regular (NOVOLIN, HUMULIN) 100 Units in 0.9% sodium chloride 100 mL infusion  1-50 Units/hr IntraVENous TITRATE 0.4 Units/hr at 07/22/11 0744   ??? dextrose (D50W) injection Syrg 12.5-25 g  12.5-25 g IntraVENous PRN     ??? ferrous sulfate tablet 325 mg  1 Tab Oral BID WITH MEALS 325 mg at 07/21/11 1703   ??? metoprolol (LOPRESSOR) tablet 75 mg  75 mg Oral BID 75 mg at 07/21/11 1704   ??? atorvastatin (LIPITOR) tablet 40 mg  40 mg Oral QHS 40 mg at 07/21/11 2214   ??? epoetin alfa (EPOGEN;PROCRIT) injection 10,000 Units  10,000 Units SubCUTAneous Q7D 10,000 Units at 07/19/11 1353   ??? pantoprazole (PROTONIX) injection 40 mg  40 mg IntraVENous Q12H 40 mg at 07/21/11 2207   ??? 0.9% sodium chloride infusion  25 mL/hr IntraVENous CONTINUOUS 25 mL/hr at 07/20/11 0125   ??? glucose chewable tablet 16 g  4 Tab Oral PRN     ??? dextrose (D50W) injection Syrg 12.5-25 g  12.5-25 g IntraVENous PRN 6.5 g at 07/22/11 0725   ??? glucagon (GLUCAGEN) injection 1 mg  1 mg IntraMUSCular PRN     ??? levothyroxine (SYNTHROID) tablet 150 mcg  150 mcg Oral ACB 150 mcg at 07/21/11 0845   ??? terazosin (HYTRIN) capsule 10 mg  10 mg Oral QHS 10 mg at 07/21/11 2213   ??? lisinopril (PRINIVIL, ZESTRIL) tablet 20 mg  20 mg Oral QHS  20 mg at 07/21/11 2213   ??? misoprostol (CYTOTEC) tablet 200 mcg  200 mcg Oral BID 200 mcg at 07/21/11 1704   ??? bumetanide (BUMEX) tablet 2 mg  2 mg Oral DAILY 2 mg at 07/21/11 0840   ??? albuterol (PROVENTIL HFA, VENTOLIN HFA) inhaler 2 Puff  2 Puff Inhalation Q6H PRN     ??? clopidogrel (PLAVIX) tablet 75 mg  75 mg Oral DAILY 75 mg at 07/21/11 0842   ??? oxyCODONE-acetaminophen (PERCOCET 7.5) 7.5-325 mg per tablet 1 Tab  1 Tab Oral Q4H PRN     ??? morphine injection 2 mg  2 mg IntraVENous Q5MIN PRN     ??? zolpidem (AMBIEN) tablet 5 mg  5 mg Oral QHS PRN     ??? acetaminophen (TYLENOL) tablet 650 mg  650 mg Oral Q4H PRN     ??? docusate sodium (COLACE) capsule 100 mg  100 mg Oral BID 100 mg at 07/20/11 1714   ??? bisacodyl (DULCOLAX) suppository 10 mg  10 mg Rectal DAILY PRN     ??? fish oil-omega-3 fatty acids 340-1,000 mg capsule 1 Cap  1 Cap Oral BID 1 Cap at 07/21/11 1800   ??? prochlorperazine (COMPAZINE) injection 5 mg  5 mg IntraVENous Q6H PRN       No Known Allergies     Review of Systems:  A comprehensive review of systems was negative except for that written in the History of Present Illness.    Objective:   Blood pressure 156/56, pulse 83, temperature 99.1 ??F (37.3 ??C), resp. rate 22, height 5\' 6"  (1.676 m), weight 85.004 kg (187 lb 6.4 oz), SpO2 95.00%.  Temp (24hrs), Avg:98.7 ??F (37.1 ??C), Min:98.3 ??F (36.8 ??C), Max:99.1 ??F (37.3 ??C)       Exam:    General:  Alert, cooperative,  Eyes:  Sclera anicteric. Pupils equally round and reactive to light.   Mouth/Throat: Mucous membranes normal, oral pharynx clear   Neck: Supple   Lungs:   Decrease breath sounds   CV:  Regular rate and rhythm,   Abdomen:   Soft, non-tender. bowel sounds normal. non-distended   Extremities: Left foot/ 1st and 2nd toe/ tips are necrotic with eschar, no warmth no drainage                           Data Review:    CBC:   Recent Labs   Basename 07/22/11 0430 07/21/11 0150 07/20/11 0420    WBC 4.8 6.0 5.3    RBC 3.78* 3.76* 3.55*    HGB 10.7* 10.8*  10.2*    HCT 32.6* 32.5* 30.4*    PLT 170 163 159    GRANS -- -- 60    LYMPH -- -- 23    EOS -- -- 2     CMP:   Recent Labs   Basename 07/22/11 0430 07/21/11 0150 07/20/11 0420    GLU 136* 176* 78    NA 139 137 136    K 3.5 4.0 3.3*    CL 103 100 100    CO2 28 29 28     BUN 20 24* 29*    CREA 1.03 1.45* 1.39*    CA 7.4* 7.5* 7.8*    AGAP 8 8 8     BUCR 19 17 21*    TBIL -- -- --    GPT -- -- --    AP -- -- --    TP -- -- --    ALB 2.3* 2.5* 2.3*    GLOB -- -- --    AGRAT -- -- --     Urinalysis:   Lab Results   Component Value Date/Time    Color YELLOW 07/18/2011  6:38 PM    Appearance CLEAR 07/18/2011  6:38 PM    pH 5.0 07/18/2011  6:38 PM    Protein TRACE 07/18/2011  6:38 PM    Glucose 500 07/18/2011  6:38 PM    Ketone 15 07/18/2011  6:38 PM    Bilirubin NEGATIVE  07/18/2011  6:38 PM    Blood TRACE 07/18/2011  6:38 PM    Urobilinogen 0.2 07/18/2011  6:38 PM    Nitrites NEGATIVE  07/18/2011  6:38 PM    Leukocyte Esterase NEGATIVE  07/18/2011  6:38 PM    WBC 0-4 07/18/2011  6:38 PM    RBC 0-3 07/18/2011  6:38 PM    Bacteria NEGATIVE  07/18/2011  6:38 PM       Microbiology:    Lab Results   Component Value Date/Time    Specimen Description: NARES 09/29/2009 11:45 PM    Specimen Description: NARES 08/08/2009 10:50 PM    Specimen Description: NARES 08/06/2009  2:00 PM      Lab Results   Component Value Date/Time    Culture result:  Value: MRSA NOT PRESENT     Screening of patient nares for MRSA Travis [name] for surveillance purposes and, if positive, to facilitate isolation considerations in high risk settings. It Travis [name] not intended for automatic decolonization interventions per se as regimens are not sufficiently effective to warrant routine use. 09/29/2009 11:45 PM    Culture result:  Value: MRSA NOT PRESENT     Screening of patient nares for MRSA Travis [name] for surveillance purposes and, if positive, to facilitate isolation considerations in  high risk settings. It Travis [name] not intended for automatic decolonization interventions per se as regimens are not  sufficiently effective to warrant routine use. 08/08/2009 10:50 PM    Culture result:  Value: MRSA NOT PRESENT     Screening of patient nares for MRSA Travis [name] for surveillance purposes and, if positive, to facilitate isolation considerations in high risk settings. It Travis [name] not intended for automatic decolonization interventions per se as regimens are not sufficiently effective to warrant routine use. 08/06/2009  2:00 PM       Studies:  X Ray foot:  Three views of the left great toe demonstrate soft tissue ulceration of the   first and second digit. There Travis [name] no convincing evidence of cortical erosion   to suggest osteomyelitis.      IMPRESSION: Soft tissue ulcerations without radiographic evidence of   osteomyelitis.           Impression/Plan     71 y/o AAM admitted with NSTEMI, CKD  Chronic Left  foot wound ulcers/ dry gangrene 1st and 2nd toe. There Travis [name] not evidence of cellulitis, he remains   Afebrile and without leukocytosis.  Will continue wound care per wound care recommendations.  No need for antibiotics at this point.  Will need eventually vascular surgery evaluation / debridement / amputation  I am signing off. Please call back if questions      Signed By: Travis Travis [name]    July 22, 2011

## 2011-07-22 NOTE — Progress Notes (Signed)
Pt returned from cath lab without diff. No acute distress noted. Cath site c/d/i, no bleeding/hematoma noted. Pt up in bed, HOB elevated, remains on insulin drip. Ambulated to toilet with stable/steady gait. Will monitor

## 2011-07-22 NOTE — Progress Notes (Addendum)
Cardiology Progress Note - [facility]        NAME:  Travis [name]   DOB:   30-Nov-1940   MRN:   [id number]     Assessment/Plan:   1) NSTEMI / CAD   - Travis [name] ruled in for a NSTEMI with peak trop 27.   - He had significant ST depression in the lateral leads on arrival.   - This is the second MI Travis [name] has had in the past couple of months (last one 05/17/11). Cath then showed multivessel CAD including a tight proximal LCX/OM stenosis.  - Cath today showed obstructive 3VD. No intervention. Continue with medical management.   - On ASA, plavix, statin,  and beta-blocker. Add daily nitrate    2) New systolic dysfunction- EF 60%-> 40/45%- no decompensated Heart failure          - cont BB and ACE-I  3) HTN- BP OK   4) Anemia   - stool is hemoccult positive.  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   - Would recommend trying to keep the Hgb in 9-10 range.   - Continue Procrit and iron replacement going forward.   - GI following.    5) CKD - Cr 1.03, near his baseline.    6) PVD - followed by Dr. 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 year old male w/ hx:  CAD , PAD, HTN, DM, OSA ( CPAP w/ O2),  Chronic anemia, gangrene right toes was admitted for NSTEMI. Travis [name] presented to the Emergency Department complaining of weakness: moderate to severe, constant, present for several days, associated with nausea and vomiting but denied any chest pain.     CARDIAC EVALUATION   CATH: 2003 and 2006 - chronic LCX occlusion   STRESS: 02/09/07. The type of test was a dobutamine stress perfusion imaging. Cardiac stress testing was negative for chest pain and myocardial ischemia. Myocardial perfusion imaging showed reversible defect(s) involving the inferior wall. Gated EF (%): 55.   ECHO: 01/19/07. Echocardiogram demonstrated normal LV  wall motion and ejection fraction, LVH and left atrial enlargement.   CAROTID: 2009 bilateral 50-79% stenosis.   CAROTIDS: 07/08/08 - 50-79% stenosis bilat   ECHO: 04/03/09 - TDS, moderate LVH, LVEF 65%; moderate LAE, RAE, mild AS (mean gradient 10), mild MR   ECHO: 1/11 - mild LVH, LAE, EF 55%, mild Pulm HTN, mild MR   CATH:06/05/09- DES to RCA   CATH: 06/22/09 - LAD 50%, LCX 100%, mid RCA patent stent, distal RCA 40%   ECHO: 06/22/09 - mild LVH, EF 55-60%, mild LAE, mild MR/TR, PASP 60   CATH: 07/18/09 - patent stent and stable findings; EF 60%   CATH: 10/16/09 - 85% ulcerated plaque mid RCA successfully stented with 2.75 Xience. CTO Cx successful PTCA and DES of proximal part with 2.25 Atom.   CATH: 05/20/11: d LM: 30 %. pLAD 30%. dLAD 80%. mLCX 100% prior to stent. OM1:80%. mRCA:100 % at the site of a prior stent.- Med tx   ECHO: 07/17/11: EF: 45%, mod LVH, moderate HK basal-mid inferoseptal and   basal-mid inferior wall(s). Grade 2 DD, LAE.   CATH: 2/25/2013Obstructive 3VD: LM d30. LAD m30, apical 70. LCx m100 (chronic); OM1 ost80. RCA p80 (diffuse), m100 (  chronic). EF 40% with basal-mid inferior AK.   No AVG, MR 1+      Cardiac ROS: no further pain, BP 120-150's Patient denies any exertional chest pain, dyspnea, palpitations, syncope, orthopnea, edema or paroxysmal nocturnal dyspnea.    Review of Systems: insulin gtt, pain managed, irritable that he is in the   hospital No nausea, indigestion, vomiting, pain, cough, sputum. No bleeding. NPO for procedure. Appetite good.         Objective:     Visit Vitals   Item Reading   ??? BP 156/56   ??? Pulse 83   ??? Temp 99.1 ??F (37.3 ??C)   ??? Resp 22   ??? Ht 5\' 6"  (1.676 m)   ??? Wt 85.004 kg (187 lb 6.4 oz)   ??? BMI 30.25 kg/m2   ??? SpO2 95%      O2 Flow Rate (L/min): 2 l/min O2 Device: Room air    Temp (24hrs), Avg:98.7 ??F (37.1 ??C), Min:98.3 ??F (36.8 ??C), Max:99.1 ??F (37.3 ??C)      Intake/Output Summary (Last 24 hours) at 07/22/11 0806  Last data filed at 07/22/11 0433   Gross per 24  hour   Intake 1260.56 ml   Output   2400 ml   Net -1139.44 ml       TELE: SR in 70s    General: AAOx3 cooperative, no acute distress.  HEENT: Atraumatic. Pink and moist.  Anicteric sclerae.  Neck: Supple, no thyromegaly.   Lungs: 2 lpm nc, CTA bilaterally. No wheezing/rhonchi/rales.  Heart: Regular rhythm, no murmur, no S3, no rubs, no gallops. No JVD. No carotid bruits.   Abdomen: Soft, non-distended, non-tender. + Bowel sounds. No bruits.  Extremities: +1 BLLE edema L>R, no clubbing, no cyanosis. No calf tenderness, right foot ulcers under bandage w/ wound care   Groin: soft, no hemtoma   Neurologic: Grossly intact.  Alert and oriented X 3.  No acute neurological distress.   Psych: Good insight. Not anxious nor agitated.    Care Plan discussed with:    Comments   Patient y    Family      RN y    Futures trader                    Consultant:          Data Review:   CMP: Lab Results   Component Value Date/Time    NA 139 07/22/2011  4:30 AM    K 3.5 07/22/2011  4:30 AM    CL 103 07/22/2011  4:30 AM    CO2 28 07/22/2011  4:30 AM    AGAP 8 07/22/2011  4:30 AM    GLU 136* 07/22/2011  4:30 AM    BUN 20 07/22/2011  4:30 AM    CREA 1.03 07/22/2011  4:30 AM    GFRAA >60 07/22/2011  4:30 AM    GFRNA >60 07/22/2011  4:30 AM    CA 7.4* 07/22/2011  4:30 AM    PHOS 2.8 07/22/2011  4:30 AM    ALB 2.3* 07/22/2011  4:30 AM     CBC:   Lab Results   Component Value Date/Time    WBC 4.8 07/22/2011  4:30 AM    HGB 10.7* 07/22/2011  4:30 AM    HCT 32.6* 07/22/2011  4:30 AM    PLT 170 07/22/2011  4:30 AM     All Cardiac Markers in the last 24 hours:   No results found for  this basename: CPK, CK, CPKMB, CKRMB, CKMMB, CKMB, RCK3, CKMBT, CKMBPC, CKSMB, CKNDX, CKND1, MYO, TROQR, TROPT, TROIQ, TROIP, TROI, TROPIT, TROPT, TRPOIT, ITNL, TNIPOC, BNP, BNPP, PBNP     Recent Glucose Results: Lab Results   Component Value Date/Time    GLPOC 68* 07/22/2011  7:11 AM    GLPOC 145* 07/22/2011  4:56 AM    GLPOC 159* 07/22/2011  3:51 AM    GLU 136* 07/22/2011  4:30 AM     ABG:    No results found for this basename: ph, phi, pco2, pco2i, po2, po2i, hco3, hco3i, fio2, fio2i         Medications reviewed  Current Facility-Administered Medications   Medication Dose Route Frequency   ??? 0.9% sodium chloride infusion  75 mL/hr IntraVENous CONTINUOUS   ??? aspirin (ASPIRIN) tablet 325 mg  325 mg Oral DAILY   ??? insulin regular (NOVOLIN, HUMULIN) 100 Units in 0.9% sodium chloride 100 mL infusion  1-50 Units/hr IntraVENous TITRATE   ??? dextrose (D50W) injection Syrg 12.5-25 g  12.5-25 g IntraVENous PRN   ??? ferrous sulfate tablet 325 mg  1 Tab Oral BID WITH MEALS   ??? metoprolol (LOPRESSOR) tablet 75 mg  75 mg Oral BID   ??? atorvastatin (LIPITOR) tablet 40 mg  40 mg Oral QHS   ??? epoetin alfa (EPOGEN;PROCRIT) injection 10,000 Units  10,000 Units SubCUTAneous Q7D   ??? pantoprazole (PROTONIX) injection 40 mg  40 mg IntraVENous Q12H   ??? 0.9% sodium chloride infusion  25 mL/hr IntraVENous CONTINUOUS   ??? glucose chewable tablet 16 g  4 Tab Oral PRN   ??? dextrose (D50W) injection Syrg 12.5-25 g  12.5-25 g IntraVENous PRN   ??? glucagon (GLUCAGEN) injection 1 mg  1 mg IntraMUSCular PRN   ??? levothyroxine (SYNTHROID) tablet 150 mcg  150 mcg Oral ACB   ??? terazosin (HYTRIN) capsule 10 mg  10 mg Oral QHS   ??? lisinopril (PRINIVIL, ZESTRIL) tablet 20 mg  20 mg Oral QHS   ??? misoprostol (CYTOTEC) tablet 200 mcg  200 mcg Oral BID   ??? bumetanide (BUMEX) tablet 2 mg  2 mg Oral DAILY   ??? albuterol (PROVENTIL HFA, VENTOLIN HFA) inhaler 2 Puff  2 Puff Inhalation Q6H PRN   ??? clopidogrel (PLAVIX) tablet 75 mg  75 mg Oral DAILY   ??? oxyCODONE-acetaminophen (PERCOCET 7.5) 7.5-325 mg per tablet 1 Tab  1 Tab Oral Q4H PRN   ??? morphine injection 2 mg  2 mg IntraVENous Q5MIN PRN   ??? zolpidem (AMBIEN) tablet 5 mg  5 mg Oral QHS PRN   ??? acetaminophen (TYLENOL) tablet 650 mg  650 mg Oral Q4H PRN   ??? docusate sodium (COLACE) capsule 100 mg  100 mg Oral BID   ??? bisacodyl (DULCOLAX) suppository 10 mg  10 mg Rectal DAILY PRN   ??? fish oil-omega-3 fatty  acids 340-1,000 mg capsule 1 Cap  1 Cap Oral BID   ??? prochlorperazine (COMPAZINE) injection 5 mg  5 mg IntraVENous Q6H PRN         Alphonzo Lemmings RN, NP Student  Sherin Quarry, ACNP

## 2011-07-22 NOTE — Progress Notes (Signed)
[facility]  [address] [place], [place] [address]  [phone]      Medical Progress Note      NAME: Travis [name]   DOB:  10/23/1940  MRM:  [id number]    Date/Time: 07/22/2011  7:18 AM         Subjective:     Chief Complaint:  Feels well.    Slept well.  Denies cp or sob.  Denies constipation.  Ready for cath.    ROS:  (bold if positive, if negative)              Objective:       Vitals:          Last 24hrs VS reviewed since prior progress note. Most recent are:    Visit Vitals   Item Reading   ??? BP 136/54   ??? Pulse 76   ??? Temp 98.3 ??F (36.8 ??C)   ??? Resp 20   ??? Ht 5\' 6"  (1.676 m)   ??? Wt 187 lb 6.4 oz   ??? BMI 30.25 kg/m2   ??? SpO2 94%     SpO2 Readings from Last 6 Encounters:   07/22/11 94%   05/25/11 97%   11/13/09 98%   11/13/09 98%   11/13/09 98%   10/17/09 100%    O2 Flow Rate (L/min): 2 l/min       Intake/Output Summary (Last 24 hours) at 07/22/11 2130  Last data filed at 07/22/11 0433   Gross per 24 hour   Intake 1500.56 ml   Output   2500 ml   Net -999.44 ml          Exam:     Physical Exam:    Gen:  Well-developed, well-nourished, in no acute distress  HEENT:  Pink conjunctivae, PERRL, hearing intact to voice, moist mucous membranes  Neck:  Supple, without masses, thyroid non-tender  Resp:  No accessory muscle use, rales on right, no wheezes or rhonchi  Card:  No murmurs, normal S1, S2 without thrills or peripheral edema  Abd:  Soft, non-tender, non-distended, normoactive bowel sounds are present  Musc:  No cyanosis or clubbing, ulcer left great toe and 2nd toe with necrotic tissue and odor without drainage  Skin:  No rashes, skin turgor is decreased  Neuro:  Cranial nerves 3-12 are grossly intact, grip strength is 5/5 bilaterally and dorsi / plantarflexion is 5/5 bilaterally, follows commands appropriately  Psych:  Good insight, oriented to person, place and time, alert       Telemetry reviewed:   Sinus rhythm intermittent pacing    Medications Reviewed: (see below)     Lab Data Reviewed: (see below)    ______________________________________________________________________    Medications:     Current Facility-Administered Medications   Medication Dose Route Frequency   ??? 0.9% sodium chloride infusion  75 mL/hr IntraVENous CONTINUOUS   ??? aspirin (ASPIRIN) tablet 325 mg  325 mg Oral DAILY   ??? insulin regular (NOVOLIN, HUMULIN) 100 Units in 0.9% sodium chloride 100 mL infusion  1-50 Units/hr IntraVENous TITRATE   ??? dextrose (D50W) injection Syrg 12.5-25 g  12.5-25 g IntraVENous PRN   ??? ferrous sulfate tablet 325 mg  1 Tab Oral BID WITH MEALS   ??? metoprolol (LOPRESSOR) tablet 75 mg  75 mg Oral BID   ??? atorvastatin (LIPITOR) tablet 40 mg  40 mg Oral QHS   ??? epoetin alfa (EPOGEN;PROCRIT) injection 10,000 Units  10,000 Units SubCUTAneous  Q7D   ??? pantoprazole (PROTONIX) injection 40 mg  40 mg IntraVENous Q12H   ??? 0.9% sodium chloride infusion  25 mL/hr IntraVENous CONTINUOUS   ??? glucose chewable tablet 16 g  4 Tab Oral PRN   ??? dextrose (D50W) injection Syrg 12.5-25 g  12.5-25 g IntraVENous PRN   ??? glucagon (GLUCAGEN) injection 1 mg  1 mg IntraMUSCular PRN   ??? levothyroxine (SYNTHROID) tablet 150 mcg  150 mcg Oral ACB   ??? terazosin (HYTRIN) capsule 10 mg  10 mg Oral QHS   ??? lisinopril (PRINIVIL, ZESTRIL) tablet 20 mg  20 mg Oral QHS   ??? misoprostol (CYTOTEC) tablet 200 mcg  200 mcg Oral BID   ??? bumetanide (BUMEX) tablet 2 mg  2 mg Oral DAILY   ??? albuterol (PROVENTIL HFA, VENTOLIN HFA) inhaler 2 Puff  2 Puff Inhalation Q6H PRN   ??? clopidogrel (PLAVIX) tablet 75 mg  75 mg Oral DAILY   ??? oxyCODONE-acetaminophen (PERCOCET 7.5) 7.5-325 mg per tablet 1 Tab  1 Tab Oral Q4H PRN   ??? morphine injection 2 mg  2 mg IntraVENous Q5MIN PRN   ??? zolpidem (AMBIEN) tablet 5 mg  5 mg Oral QHS PRN   ??? acetaminophen (TYLENOL) tablet 650 mg  650 mg Oral Q4H PRN   ??? docusate sodium (COLACE) capsule 100 mg  100 mg Oral BID   ??? bisacodyl (DULCOLAX) suppository 10 mg  10 mg Rectal DAILY PRN   ??? fish oil-omega-3  fatty acids 340-1,000 mg capsule 1 Cap  1 Cap Oral BID   ??? prochlorperazine (COMPAZINE) injection 5 mg  5 mg IntraVENous Q6H PRN            Lab Review:     Recent Labs   Basename 07/22/11 0430 07/21/11 0150 07/20/11 0420    WBC 4.8 6.0 5.3    HGB 10.7* 10.8* 10.2*    HCT 32.6* 32.5* 30.4*    PLT 170 163 159     Recent Labs   Basename 07/22/11 0430 07/21/11 0150 07/20/11 0420    NA 139 137 136    K 3.5 4.0 3.3*    CL 103 100 100    CO2 28 29 28     GLU 136* 176* 78    BUN 20 24* 29*    CREA 1.03 1.45* 1.39*    CA 7.4* 7.5* 7.8*    MG -- 1.8 1.5*    PHOS 2.8 2.8 2.6    ALB 2.3* 2.5* 2.3*    TBIL -- -- --    SGOT -- -- --    INR -- -- --     Lab Results   Component Value Date/Time    POC GLUCOSE 145 07/22/2011  4:56 AM    POC GLUCOSE 159 07/22/2011  3:51 AM    POC GLUCOSE 200 07/22/2011  2:48 AM    POC GLUCOSE 207 07/22/2011  1:45 AM    POC GLUCOSE 152 07/22/2011 12:39 AM            Assessment / Plan:     DM (05/16/2011):  Restarted insulin gtt in prep for cath as po intake will be inconsistent.   -- continue insulin gtt per protocol   -- resume lantus post cath once glucose stable    NSTEMI (non-ST elevated myocardial infarction) / CAD (coronary artery disease) / HTN / NSVT / Chronic diastolic and systolic CHF:  Appreciate cardiology evaluation.  Weight stable.  Denies cp or sob.   --  continue on heparin gtt    -- continue ASA, Plavix, lisinopril, metoprolol   -- cath planned today    Anemia, unspecified (09/29/2009):  S/p transfusion.  Stable hgb post transfusion.   -- continue iron and procrit   -- monitor hgb     Hyperlipidemia (10/06/2009)    -- continue statin and fish oil     Unspecified hypothyroidism (05/16/2011)    -- continue LT4    PAD (peripheral artery disease) (05/18/2011) / Foot ulcer (07/18/2011):  Appreciate wound care and vascular eval.  Seems more consistent with dry gangrene.  Afebrile and no leukocytosis to suggest infection.  Xray without radiologic evid for osteo.   -- continue local wound care   -- ID  eval    CKD (chronic kidney disease) stage 3, GFR 30-59 ml/min (07/18/2011):  Creat improved overnight.  Appreciate renal eval.   -- follow creat esp post cath    Total time spent with patient: 25 Minutes                  Care Plan discussed with: Patient and Nursing Staff    Discussed:  Care Plan    Prophylaxis:  Hep IV and H2B/PPI    Disposition:  Home w/Family           ___________________________________________________    Attending Physician: Suzan Garibaldi, MD

## 2011-07-22 NOTE — Other (Signed)
DTC:  Pt back to room after cath.  Drip off during cath due to low BG 80 - 89.  BG now 203 - drip restarted at 1.4 units/hr.  DTC will f/u with pt later this pm.

## 2011-07-22 NOTE — Progress Notes (Signed)
[facility] Progress Note  [facility]   [address] [place], [place] [address]   [phone]      Assessment & Plan:   1. CKD 3:Cr runs between 1.7 to 2.1 mg%   - cr is below his baseline   - IVF prior to contrast    - hold bumex for now     2. Anemia    -  stable   - on epo   - s/p transfusion 2 units   3. NSTEMI:Severe CAD   -cath at 10 am  - HE DOES NOT WANT HD in any circumstances   4.DM   5.HTN:139/58   6. CAD   6.PAD   7. Foot ulcer         Subjective:   CC:f/up for ARF,CKD  HPI: Patient seen   1. ckd is stable  2. Anemia:stable  3.BJ:YNWG for cath today  ROS:no cp/sob/n/v/abd pain  Current Facility-Administered Medications   Medication Dose Route Frequency   ??? 0.9% sodium chloride infusion  75 mL/hr IntraVENous CONTINUOUS   ??? aspirin (ASPIRIN) tablet 325 mg  325 mg Oral DAILY   ??? insulin regular (NOVOLIN, HUMULIN) 100 Units in 0.9% sodium chloride 100 mL infusion  1-50 Units/hr IntraVENous TITRATE   ??? dextrose (D50W) injection Syrg 12.5-25 g  12.5-25 g IntraVENous PRN   ??? ferrous sulfate tablet 325 mg  1 Tab Oral BID WITH MEALS   ??? metoprolol (LOPRESSOR) tablet 75 mg  75 mg Oral BID   ??? atorvastatin (LIPITOR) tablet 40 mg  40 mg Oral QHS   ??? epoetin alfa (EPOGEN;PROCRIT) injection 10,000 Units  10,000 Units SubCUTAneous Q7D   ??? pantoprazole (PROTONIX) injection 40 mg  40 mg IntraVENous Q12H   ??? 0.9% sodium chloride infusion  25 mL/hr IntraVENous CONTINUOUS   ??? glucose chewable tablet 16 g  4 Tab Oral PRN   ??? dextrose (D50W) injection Syrg 12.5-25 g  12.5-25 g IntraVENous PRN   ??? glucagon (GLUCAGEN) injection 1 mg  1 mg IntraMUSCular PRN   ??? levothyroxine (SYNTHROID) tablet 150 mcg  150 mcg Oral ACB   ??? terazosin (HYTRIN) capsule 10 mg  10 mg Oral QHS   ??? lisinopril (PRINIVIL, ZESTRIL) tablet 20 mg  20 mg Oral QHS   ??? misoprostol (CYTOTEC) tablet 200 mcg  200 mcg Oral BID   ??? albuterol (PROVENTIL HFA, VENTOLIN HFA) inhaler 2 Puff  2 Puff  Inhalation Q6H PRN   ??? clopidogrel (PLAVIX) tablet 75 mg  75 mg Oral DAILY   ??? oxyCODONE-acetaminophen (PERCOCET 7.5) 7.5-325 mg per tablet 1 Tab  1 Tab Oral Q4H PRN   ??? morphine injection 2 mg  2 mg IntraVENous Q5MIN PRN   ??? zolpidem (AMBIEN) tablet 5 mg  5 mg Oral QHS PRN   ??? acetaminophen (TYLENOL) tablet 650 mg  650 mg Oral Q4H PRN   ??? docusate sodium (COLACE) capsule 100 mg  100 mg Oral BID   ??? bisacodyl (DULCOLAX) suppository 10 mg  10 mg Rectal DAILY PRN   ??? fish oil-omega-3 fatty acids 340-1,000 mg capsule 1 Cap  1 Cap Oral BID   ??? prochlorperazine (COMPAZINE) injection 5 mg  5 mg IntraVENous Q6H PRN          Objective:     Vitals:  Blood pressure 156/56, pulse 83, temperature 99.1 ??F (37.3 ??C), resp. rate 22, height 5\' 6"  (1.676 m), weight 187 lb 6.4 oz (85.004 kg), SpO2 95.00%.  Temp (24hrs), Avg:98.7 ??F (37.1 ??C), Min:98.3 ??F (36.8 ??C), Max:99.1 ??F (37.3 ??C)      Intake and Output:  02/25 0700 - 02/25 1859  In: -   Out: 200 [Urine:200]  02/23 1900 - 02/25 0659  In: 2864.1 [P.O.:2418; I.V.:446.1]  Out: 3900 [Urine:3900]    Physical Exam:                Patient is intubated:  no    Physical Examination:   GENERAL ASSESSMENT: NAD  HEENT:Nontraumatic   CHEST: CTA  HEART: S1S2  ABDOMEN: Soft,NT,  WG:NFAOZ: n  EXTREMITY: EDEMA 1  NEURO:Grossly non focal          ECG/rhythm:    Data Review      No results found for this basename: ITNL in the last 72 hours   Recent Labs   Basename 07/19/11 1030    CPK 448*    CKMB --    TROIQ 17.72*     Recent Labs   Basename 07/22/11 0430 07/21/11 0150 07/20/11 0420    NA 139 137 136    K 3.5 4.0 3.3*    CL 103 100 100    CO2 28 29 28     BUN 20 24* 29*    CREA 1.03 1.45* 1.39*    GLU 136* 176* 78    PHOS 2.8 2.8 2.6    MG -- 1.8 1.5*    ALB 2.3* 2.5* 2.3*    WBC 4.8 6.0 5.3    HGB 10.7* 10.8* 10.2*    HCT 32.6* 32.5* 30.4*    PLT 170 163 159     No results found for this basename: INR:3,PTP:3,APTT:3, in the last 72 hours  Needs: urine analysis, urine sodium, protein and  creatinine  Lab Results   Component Value Date/Time    Sodium,urine random 59 05/21/2011  9:00 PM    Creatinine,urine random 32.49 05/21/2011  9:00 PM         Discussed with:  Nursing, pt    cards      [facility]  [address]  [place], [place] [address]  Phone - [phone]  Fax - [phone]

## 2011-07-22 NOTE — Progress Notes (Signed)
Agree with NP's note. Will follow.

## 2011-07-22 NOTE — Progress Notes (Signed)
Bedside and Verbal shift change report given to Ravy, RN (oncoming nurse) by Kristle,RN (offgoing nurse).  Report given with SBAR, Kardex, ED Summary, Procedure Summary, Intake/Output, MAR, Accordion and Recent Results.

## 2011-07-22 NOTE — Progress Notes (Signed)
TRANSFER - OUT REPORT:    Verbal report given to Barkley Bruns, RN on Rande Lawman being transferred to [facility] for routine progression of care       Report consisted of patient???s Situation, Background, Assessment and   Recommendations(SBAR).     Information from the following report(s) SBAR, Procedure Summary and MAR was reviewed with the receiving nurse.    Opportunity for questions and clarification was provided.      Jason DePhillips,  NREMT-P, CVIS

## 2011-07-22 NOTE — Procedures (Signed)
Cath:  Obstructive 3VD:     LM d30.     LAD m30, apical 70.     LCx m100 (chronic); OM1 ost80.     RCA p80 (diffuse), m100 (chronic).  Moderate LV systolic dysfunction     EF 40% with basal-mid inferior AK.  No AVG, MR 1+  RFA manual.

## 2011-07-22 NOTE — Progress Notes (Signed)
07/22/2011 09:03A Pt is scheduled for a cardiac catherization today. Will reevaluate pt post-catherization . Currently, pt is set up for home health services for wound care , CHF management , telemonitoring ,PT,and diabetic management. [employer] [facility] [facility] services pt for  o2/CPaP needs.Marliss Czar

## 2011-07-22 NOTE — Other (Signed)
DTC Consult Note    Recommendations/ Comments: Consult received for assessment of home management. Pt has been seen by Travis [name] in the past and was also seen by Travis [name] on 07-19-11. His family was present in the room. He was not receptive to education.    Consult received for:  [x]              Assessment of home management     []              Meter/monitoring     []              Insulin instruction     []              New diagnosis     []              Outpatient education     []              Insulin pump patient     []              Insulin infusion     []              DKA/HHS    Chart reviewed and initial evaluation complete on Travis [name].    Patient is a 71 y.o. male with known diabetes on Lantus and Novolog at home. States he rarely skips his insulin "unless something is going on". Reviewed importance of consistently taking diabetes medications as prescribed.    BG monitoring at home 2-3 times per day at home.  Pt states his number are all over the place and could not provide me with a range.     Assessed and instructed patient on the following: .     ?? Hypoglycemia prevention and treatment-reviewed how to properly treat a low blood sugar  ?? Nutrition - do not skip meals, avoid sweetened beverages, watch portions of starchy foods  ?? Medications- importance of taking insulin consistently as prescribed, even when "something is going on"    Encouraged the following:   ?? dietary modifications: 3 meals a day   ?? regular blood sugar monitoring: continue to test blood sugars at least bid at home  ?? Take insulin as prescribed    Provided patient with the following: []              Survival skills education materials               []              Insulin education materials               []              CHO counting education materials               []              Outpatient DTC contact number               []              Glucometer               []              Insulin start kit- vial/syringe               []              Insulin start  kit- pen    Patient was able to give return demonstration  of []        glucometer []        saline injection with []       no/ []        minimal assistance needed.  []        Nurse to have patient self inject prior to discharge.    A1c:   Lab Results   Component Value Date/Time    Hemoglobin A1c 10.5 05/17/2011  2:22 AM       POC Glucose last 24hrs:   Lab Results   Component Value Date/Time    POC GLUCOSE 259 07/22/2011  2:43 PM    POC GLUCOSE 231 07/22/2011  1:41 PM    POC GLUCOSE 203 07/22/2011 12:25 PM    POC GLUCOSE 89 07/22/2011  9:51 AM    POC GLUCOSE 81 07/22/2011  8:46 AM    POC GLUCOSE 99 07/22/2011  7:36 AM    POC GLUCOSE 68 07/22/2011  7:11 AM    POC GLUCOSE 145 07/22/2011  4:56 AM       Lab Results   Component Value Date/Time    Creatinine 1.03 07/22/2011  4:30 AM       Current hospital DM medication: pt currently on insulin infusion.     Will continue to follow as needed.    Thank you.  Barbra Sarks, RD, CDE

## 2011-07-22 NOTE — Progress Notes (Signed)
Cardiac Cath Lab Procedure Area Arrival Note:    Travis [name] arrived to Cardiac Cath Lab, Procedure Area. Patient identifiers verified with NAME and DATE OF BIRTH. Procedure verified with patient. Consent forms verified. Allergies verified. Patient informed of procedure and plan of care. Questions answered with review. Patient voiced understanding of procedure and plan of care.    Patient on cardiac monitor, non-invasive blood pressure, SPO2 monitor.  Patient is A&O. Patient reports shortness of breath and chest pain.    Patient medicated during procedure with orders obtained and verified by Dr. Elmon Kirschner.    Refer to patient PROCEDURE REPORT for vital signs and procedure log.    Jason DePhillips,  NREMT-P, CVIS

## 2011-07-23 LAB — GLUCOSE, POC
Glucose (POC): 110 mg/dL (ref 75–110)
Glucose (POC): 72 mg/dL — ABNORMAL LOW (ref 75–110)
Glucose (POC): 130 mg/dL — ABNORMAL HIGH (ref 75–110)
Glucose (POC): 260 mg/dL — ABNORMAL HIGH (ref 75–110)
Glucose (POC): 305 mg/dL — ABNORMAL HIGH (ref 75–110)
Glucose (POC): 204 mg/dL — ABNORMAL HIGH (ref 75–110)
Glucose (POC): 238 mg/dL — ABNORMAL HIGH (ref 75–110)
Glucose (POC): 142 mg/dL — ABNORMAL HIGH (ref 75–110)
Glucose (POC): 83 mg/dL (ref 75–110)
Glucose (POC): 127 mg/dL — ABNORMAL HIGH (ref 75–110)
Glucose (POC): 173 mg/dL — ABNORMAL HIGH (ref 75–110)
Glucose (POC): 198 mg/dL — ABNORMAL HIGH (ref 75–110)
Glucose (POC): 188 mg/dL — ABNORMAL HIGH (ref 75–110)
Glucose (POC): 182 mg/dL — ABNORMAL HIGH (ref 75–110)
Glucose (POC): 184 mg/dL — ABNORMAL HIGH (ref 75–110)
Glucose (POC): 146 mg/dL — ABNORMAL HIGH (ref 75–110)
Glucose (POC): 148 mg/dL — ABNORMAL HIGH (ref 75–110)
Glucose (POC): 183 mg/dL — ABNORMAL HIGH (ref 75–110)

## 2011-07-23 LAB — CBC W/O DIFF
HCT: 31.4 % — ABNORMAL LOW (ref 36.6–50.3)
HGB: 10.1 g/dL — ABNORMAL LOW (ref 12.1–17.0)
MCH: 28.3 PG (ref 26.0–34.0)
MCHC: 32.2 g/dL (ref 30.0–36.5)
MCV: 88 FL (ref 80.0–99.0)
PLATELET: 172 10*3/uL (ref 150–400)
RBC: 3.57 M/uL — ABNORMAL LOW (ref 4.10–5.70)
RDW: 16.6 % — ABNORMAL HIGH (ref 11.5–14.5)
WBC: 5.5 10*3/uL (ref 4.1–11.1)

## 2011-07-23 LAB — RENAL FUNCTION PANEL
Albumin: 2.3 g/dL — ABNORMAL LOW (ref 3.5–5.0)
Anion gap: 8 mmol/L (ref 5–15)
BUN/Creatinine ratio: 17 (ref 12–20)
BUN: 19 MG/DL (ref 6–20)
CO2: 27 MMOL/L (ref 21–32)
Calcium: 8.1 MG/DL — ABNORMAL LOW (ref 8.5–10.1)
Chloride: 102 MMOL/L (ref 97–108)
Creatinine: 1.1 MG/DL (ref 0.45–1.15)
GFR est AA: >60 mL/min/{1.73_m2} (ref >60)
GFR est non-AA: >60 mL/min/{1.73_m2} (ref >60)
Glucose: 130 MG/DL — ABNORMAL HIGH (ref 65–100)
Phosphorus: 3.9 MG/DL (ref 2.5–4.9)
Potassium: 3.8 MMOL/L (ref 3.5–5.1)
Sodium: 137 MMOL/L (ref 136–145)

## 2011-07-23 MED ORDER — INSULIN GLARGINE 100 UNIT/ML INJECTION
100 unit/mL | Freq: Every day | SUBCUTANEOUS | Status: DC
Start: 2011-07-23 — End: 2011-07-24
  Administered 2011-07-23 – 2011-07-24 (×2): via SUBCUTANEOUS

## 2011-07-23 MED ORDER — DEXTROSE 50% IN WATER (D50W) IV SYRG
INTRAVENOUS | Status: DC | PRN
Start: 2011-07-23 — End: 2011-07-24

## 2011-07-23 MED ORDER — ASPIRIN 81 MG CHEWABLE TAB
81 mg | Freq: Every day | ORAL | Status: DC
Start: 2011-07-23 — End: 2011-07-24
  Administered 2011-07-23 – 2011-07-24 (×2): via ORAL

## 2011-07-23 MED ORDER — ISOSORBIDE MONONITRATE SR 30 MG 24 HR TAB
30 mg | ORAL_TABLET | Freq: Every day | ORAL | Status: DC
Start: 2011-07-23 — End: 2012-01-13

## 2011-07-23 MED ORDER — METOPROLOL TARTRATE 50 MG TAB
50 mg | Freq: Two times a day (BID) | ORAL | Status: DC
Start: 2011-07-23 — End: 2011-07-24
  Administered 2011-07-23 – 2011-07-24 (×2): via ORAL

## 2011-07-23 MED ORDER — INSULIN LISPRO 100 UNIT/ML INJECTION
100 unit/mL | Freq: Four times a day (QID) | SUBCUTANEOUS | Status: DC
Start: 2011-07-23 — End: 2011-07-24
  Administered 2011-07-23 – 2011-07-24 (×4): via SUBCUTANEOUS

## 2011-07-23 MED ORDER — METOPROLOL TARTRATE 100 MG TAB
100 mg | ORAL_TABLET | Freq: Two times a day (BID) | ORAL | Status: DC
Start: 2011-07-23 — End: 2012-01-13

## 2011-07-23 MED ORDER — HEPARIN (PORCINE) 5,000 UNIT/ML IJ SOLN
5000 unit/mL | Freq: Three times a day (TID) | INTRAMUSCULAR | Status: DC
Start: 2011-07-23 — End: 2011-07-24
  Administered 2011-07-23 – 2011-07-24 (×3): via SUBCUTANEOUS

## 2011-07-23 MED ORDER — PANTOPRAZOLE 40 MG TAB, DELAYED RELEASE
40 mg | Freq: Every day | ORAL | Status: DC
Start: 2011-07-23 — End: 2011-07-24
  Administered 2011-07-23 – 2011-07-24 (×2): via ORAL

## 2011-07-23 MED ORDER — ATORVASTATIN 40 MG TAB
40 mg | ORAL_TABLET | Freq: Every evening | ORAL | Status: DC
Start: 2011-07-23 — End: 2012-01-13

## 2011-07-23 MED FILL — LEVOTHYROXINE 150 MCG TAB: 150 mcg | ORAL | Qty: 1

## 2011-07-23 MED FILL — INSULIN GLARGINE 100 UNIT/ML INJECTION: 100 unit/mL | SUBCUTANEOUS | Qty: 0.2

## 2011-07-23 MED FILL — PLAVIX 75 MG TABLET: 75 mg | ORAL | Qty: 1

## 2011-07-23 MED FILL — FERROUS SULFATE 325 MG (65 MG ELEMENTAL IRON) TAB: 325 mg (65 mg iron) | ORAL | Qty: 1

## 2011-07-23 MED FILL — HEPARIN (PORCINE) 5,000 UNIT/ML IJ SOLN: 5000 unit/mL | INTRAMUSCULAR | Qty: 1

## 2011-07-23 MED FILL — INSULIN LISPRO 100 UNIT/ML INJECTION: 100 unit/mL | SUBCUTANEOUS | Qty: 1

## 2011-07-23 MED FILL — PROTONIX 40 MG INTRAVENOUS SOLUTION: 40 mg | INTRAVENOUS | Qty: 40

## 2011-07-23 MED FILL — DOK 100 MG CAPSULE: 100 mg | ORAL | Qty: 1

## 2011-07-23 MED FILL — FISH OIL 340 MG-1,000 MG CAPSULE: 340-1000 mg | ORAL | Qty: 1

## 2011-07-23 MED FILL — LISINOPRIL 20 MG TAB: 20 mg | ORAL | Qty: 1

## 2011-07-23 MED FILL — MISOPROSTOL 200 MCG TAB: 200 mcg | ORAL | Qty: 1

## 2011-07-23 MED FILL — METOPROLOL TARTRATE 25 MG TAB: 25 mg | ORAL | Qty: 1

## 2011-07-23 MED FILL — ISOSORBIDE MONONITRATE SR 30 MG 24 HR TAB: 30 mg | ORAL | Qty: 1

## 2011-07-23 MED FILL — TERAZOSIN 5 MG CAP: 5 mg | ORAL | Qty: 2

## 2011-07-23 MED FILL — LIPITOR 20 MG TABLET: 20 mg | ORAL | Qty: 2

## 2011-07-23 MED FILL — ASPIRIN 81 MG CHEWABLE TAB: 81 mg | ORAL | Qty: 1

## 2011-07-23 MED FILL — INSULIN REGULAR HUMAN 100 UNIT/ML INJECTION: 100 unit/mL | INTRAMUSCULAR | Qty: 1

## 2011-07-23 MED FILL — METOPROLOL TARTRATE 50 MG TAB: 50 mg | ORAL | Qty: 2

## 2011-07-23 NOTE — Other (Signed)
Rounds complete reviewed plan of care

## 2011-07-23 NOTE — Progress Notes (Signed)
Agree with NP's note.

## 2011-07-23 NOTE — Progress Notes (Signed)
Bedside and Verbal shift change report given to Kathleen, RN (oncoming nurse) by Ravy, RN (offgoing nurse).  Report given with SBAR, Kardex, Intake/Output, MAR and Recent Results.

## 2011-07-23 NOTE — Progress Notes (Signed)
Pharmacist Medication Reconciliation Summary - 07/23/11    Patient admitted with NSTEMI - s/p cardiac cath on 07/22/11.    Patient Active Problem List   Diagnoses Code   ??? CAD (coronary artery disease) 414.00   ??? Anemia, unspecified 285.9   ??? Back pain 724.5   ??? Hyperlipidemia 272.4   ??? HTN (hypertension) 401.9   ??? NSTEMI (non-ST elevated myocardial infarction) 410.70   ??? DKA, type 2, not at goal 250.12   ??? Unspecified hypothyroidism 244.9   ??? PAD (peripheral artery disease) 447.9   ??? Ischemic toe ulcer 707.15   ??? CKD (chronic kidney disease) stage 3, GFR 30-59 ml/min 585.3   ??? Foot ulcer 707.15     Summary of Visit:  The following new medications/medication changes were discussed with patient at bedside.  ?? Metoprolol (Lopressor) - patient advised of dose increase.  ?? Isosorbide Mononitrate (Imdur ER) - for Chest pain. Advised patient that this medication is for chest pain, but is taken every day to help prevent chest pain. Side effects discussed (dizziness, headache); patient advised that he may try Tylenol for headache and to contact MD if dizziness or headache are severe.  ?? Epoetin Alfa (Procrit, Epogen) - per patient interview, he used to get Epogen injections as an outpatient at [facility] in the past and is familiar with this medication. He said he was getting injections, but has not had any injections recently because MD said his labs were OK and he did not need it any longer. Pharmacist advised patient that he may need to start injections again as outpatient for a little while until his labs improve again.   ?? Atorvastatin (Lipitor) - patient was taking Zocor as outpatient; statin changed to Lipitor by MD. Discussed with patient that he will now be taking Lipitor instead of Zocor. Advised him that he may need lab work periodically and to contact his doctor if he experiences dark urine, pale stools, yellowing of skin or eyes, epigastric pain, or muscle pain/spasms/cramping. Per patient, he does drink  grapefruit juice. Patient advised to not drink more than one glass per day while taking this medication.    Medication compliance: Patient states his daughter prepares weekly pill box for him. Patient provided with new pill box. Medication Tip Sheet given to patient.    Sign: JENNY R BESHERE

## 2011-07-23 NOTE — Progress Notes (Incomplete)
Patient is up sitting in recliner he is AOX3, he is agitated that he could not go home today but was kept overnight since he had just came off insulin drip at noon today. His right groin has a dressing that is clean dry and intact no bleeding no hematoma no oozing. He is right toe has a dressing on it which had just been changed earlier this evening by the day shift nurse as given in report. This is a diabetic necrotic right toe that will be amputated at another time. The patient denies having any chest pain, SOB or discomfort at this time. He still has his dinner tray and said he will eat around 2000.  Will continue to monitor patient for changes in his condition.    2200 Patient's BG 141 no coverage needed patient was given ice cream for a snack. Patient denies having any pain     0000 patient is resting in recliner he denies having any pain or discomfort at this time.    0400 patient is resting in recliner he denies having any pain or discomfort at this time.

## 2011-07-23 NOTE — Progress Notes (Signed)
[facility] Progress Note  [facility]   [address] [place], [place] [address]   [phone]      Assessment & Plan:   1. CKD 3:Cr runs between 1.7 to 2.1 mg%   - cr is below his baseline   - Got IVF prior to contrast    - Resume bumex in am/on dc:OK TO DC from my [place] ( home dose)  - cr stable post cath       2. Anemia    -  stable   -  on epo   - s/p transfusion 2 units       3. NSTEMI:Severe CAD   -cath 3VD:Medical management         4.DM   5.HTN:139/58   6. CAD   6.PAD   7. Foot ulcer         Subjective:   CC:f/up for ARF,CKD  HPI: Patient seen   1. ckd is stable  2. Anemia:stable  3.MI:had cath yesterday:3 vd  ROS:no cp/sob/n/v/abd pain  Current Facility-Administered Medications   Medication Dose Route Frequency   ??? insulin glargine (LANTUS) injection 20 Units  20 Units SubCUTAneous DAILY   ??? heparin (porcine) injection 5,000 Units  5,000 Units SubCUTAneous Q8H   ??? pantoprazole (PROTONIX) tablet 40 mg  40 mg Oral ACB   ??? heparinized saline 2 units/mL infusion 1,000 Units  500 mL Irrigation ONCE   ??? heparinized saline 2 units/mL infusion 1,000 Units  500 mL IntraarTERial ONCE   ??? acetaminophen (TYLENOL) tablet 650 mg  650 mg Oral Q4H PRN   ??? morphine injection 2 mg  2 mg IntraVENous Q4H PRN   ??? diphenhydrAMINE (BENADRYL) injection 50 mg  50 mg IntraVENous Q4H PRN   ??? famotidine (PF) (PEPCID) injection 20 mg  20 mg IntraVENous ONCE PRN   ??? hydrocortisone Sod Succ (PF) (SOLU-CORTEF) injection 100 mg  100 mg IntraVENous ONCE PRN   ??? ondansetron (ZOFRAN) injection 4 mg  4 mg IntraVENous ONCE PRN   ??? 0.9% sodium chloride infusion  75 mL/hr IntraVENous CONTINUOUS   ??? prochlorperazine (COMPAZINE) injection 5 mg  5 mg IntraVENous Q4H PRN   ??? isosorbide mononitrate ER (IMDUR) tablet 30 mg  30 mg Oral DAILY   ??? insulin regular (NOVOLIN, HUMULIN) 100 Units in 0.9% sodium chloride 100 mL infusion  1-50 Units/hr IntraVENous TITRATE   ??? dextrose  (D50W) injection Syrg 12.5-25 g  12.5-25 g IntraVENous PRN   ??? ferrous sulfate tablet 325 mg  1 Tab Oral BID WITH MEALS   ??? metoprolol (LOPRESSOR) tablet 75 mg  75 mg Oral BID   ??? atorvastatin (LIPITOR) tablet 40 mg  40 mg Oral QHS   ??? epoetin alfa (EPOGEN;PROCRIT) injection 10,000 Units  10,000 Units SubCUTAneous Q7D   ??? glucose chewable tablet 16 g  4 Tab Oral PRN   ??? dextrose (D50W) injection Syrg 12.5-25 g  12.5-25 g IntraVENous PRN   ??? glucagon (GLUCAGEN) injection 1 mg  1 mg IntraMUSCular PRN   ??? levothyroxine (SYNTHROID) tablet 150 mcg  150 mcg Oral ACB   ??? terazosin (HYTRIN) capsule 10 mg  10 mg Oral QHS   ??? lisinopril (PRINIVIL, ZESTRIL) tablet 20 mg  20 mg Oral QHS   ??? misoprostol (CYTOTEC) tablet 200 mcg  200 mcg Oral BID   ??? albuterol (PROVENTIL HFA, VENTOLIN HFA) inhaler 2 Puff  2 Puff Inhalation Q6H PRN   ??? clopidogrel (PLAVIX) tablet  75 mg  75 mg Oral DAILY   ??? oxyCODONE-acetaminophen (PERCOCET 7.5) 7.5-325 mg per tablet 1 Tab  1 Tab Oral Q4H PRN   ??? morphine injection 2 mg  2 mg IntraVENous Q5MIN PRN   ??? zolpidem (AMBIEN) tablet 5 mg  5 mg Oral QHS PRN   ??? acetaminophen (TYLENOL) tablet 650 mg  650 mg Oral Q4H PRN   ??? docusate sodium (COLACE) capsule 100 mg  100 mg Oral BID   ??? bisacodyl (DULCOLAX) suppository 10 mg  10 mg Rectal DAILY PRN   ??? fish oil-omega-3 fatty acids 340-1,000 mg capsule 1 Cap  1 Cap Oral BID          Objective:     Vitals:  Blood pressure 157/60, pulse 77, temperature 98.8 ??F (37.1 ??C), resp. rate 17, height 5\' 6"  (1.676 m), weight 189 lb (85.73 kg), SpO2 96.00%.  Temp (24hrs), Avg:98.5 ??F (36.9 ??C), Min:97.6 ??F (36.4 ??C), Max:98.8 ??F (37.1 ??C)      Intake and Output:  02/26 0700 - 02/26 1859  In: 350 [P.O.:350]  Out: 50 [Urine:50]  02/24 1900 - 02/26 0659  In: 3166.9 [P.O.:1960; I.V.:1206.9]  Out: 3300 [Urine:3300]    Physical Exam:                Patient is intubated:  no    Physical Examination:   GENERAL ASSESSMENT: NAD  HEENT:Nontraumatic   CHEST: CTA  HEART: S1S2   ABDOMEN: Soft,NT,  ZO:XWRUE: n  EXTREMITY: EDEMA 1  NEURO:Grossly non focal          ECG/rhythm:    Data Review      No results found for this basename: ITNL in the last 72 hours   No results found for this basename: CPK:3,CKMB:3,TROIQ:3, in the last 72 hours  Recent Labs   Basename 07/23/11 0425 07/22/11 0430 07/21/11 0150    NA 137 139 137    K 3.8 3.5 4.0    CL 102 103 100    CO2 27 28 29     BUN 19 20 24*    CREA 1.10 1.03 1.45*    GLU 130* 136* 176*    PHOS 3.9 2.8 2.8    MG -- -- 1.8    ALB 2.3* 2.3* 2.5*    WBC 5.5 4.8 6.0    HGB 10.1* 10.7* 10.8*    HCT 31.4* 32.6* 32.5*    PLT 172 170 163     No results found for this basename: INR:3,PTP:3,APTT:3, in the last 72 hours  Needs: urine analysis, urine sodium, protein and creatinine  Lab Results   Component Value Date/Time    Sodium,urine random 59 05/21/2011  9:00 PM    Creatinine,urine random 32.49 05/21/2011  9:00 PM         Discussed with:  Nursing, pt    cards      [facility]  [address]  [place], [place] [address]  Phone - [phone]  Fax - [phone]

## 2011-07-23 NOTE — Progress Notes (Signed)
Updated clinicals faxed via [facility] to [facility] in preparation of potential discharge tomorrow.Marliss Czar

## 2011-07-23 NOTE — Discharge Summary (Signed)
Physician Interim Summary   Patient ID:  Travis [name]  [id number]  71 y.o.  January 29, 1941    PCP: Sol Blazing, MD     Consults: cardiology, ID, GI, nephrology and vascular surgery    Covering dates: 07/18/2011 through 07/23/2011    Admission Diagnoses: NSTEMI (non-ST elevated myocardial infarction)    Hospital Course:   DM (05/16/2011):  Patient was placed on insulin gtt on two separate occasions this admission due to labile glucose with hyper and hypoglycemia.  Insulin gtt has been stopped this AM and Lantus started in place.  Will ensure adequate glucose control with Lantus prior to discharge.    NSTEMI (non-ST elevated myocardial infarction) / CAD (coronary artery disease) / HTN / Chronic diastolic and systolic CHF:  Patient was continued on heparin IV along with ASA, Plavix, lisinopril, and metoprolol.  Cath was done, but CAD was not amenable to PCI.  Medical management was recommended by cardiology.     Anemia, unspecified (09/29/2009):  Patient did require transfusion this admission.  Stable hgb post transfusion.  Iron and procrit were continued.  GI was consulted for heme positive stool.  No plans for endoscopic evaluation have been made unless visible blood loss occurs.    Hyperlipidemia (10/06/2009):  Patient was started on atorvastatin in place of simvastatin.     PAD (peripheral artery disease) (05/18/2011) / Foot ulcer (07/18/2011):  Patient was seen by wound care.  Patient has been afebrile and xrays were not consistent with osteo.  Patient was seen by ID and vascular surgery and outpatient follow up with vascular surgery has been recommended.  Cardiology has recommended waiting 30 days prior to surgical treatment if possible.     CKD (chronic kidney disease) stage 3, GFR 30-59 ml/min (07/18/2011):  Patient was seen by nephrology.  Creatinine has been stable.  Diuretics are to be restarted at discharge.        Additional hospital course and discharge summary will be done by discharging physician.

## 2011-07-23 NOTE — Progress Notes (Signed)
[place] [facility]  Delana Meyer, M.D.  [phone]           GI PROGRESS NOTE      NAME: Travis [name]   DOB:  [birth year]   MRN:  [id number]       Subjective:   No GI complaints, denies any melena or rectal bleeding. No abdm pain or nausea.       Objective:     VITALS:   Last 24hrs VS reviewed since prior progress note. Most recent are:  Visit Vitals   Item Reading   ??? BP 157/60   ??? Pulse 77   ??? Temp 98.8 ??F (37.1 ??C)   ??? Resp 17   ??? Ht 5\' 6"  (1.676 m)   ??? Wt 85.73 kg (189 lb)   ??? BMI 30.51 kg/m2   ??? SpO2 96%       Intake/Output Summary (Last 24 hours) at 07/23/11 1045  Last data filed at 07/23/11 5284   Gross per 24 hour   Intake 2792.5 ml   Output   2100 ml   Net  692.5 ml     PHYSICAL EXAM:  General: Alert, in no acute distress????  HEENT: Anicteric sclerae.  Lungs:  CTA Bilaterally.   Heart:  Regular  Rhythm and rate  Abdomen: Soft, Non distended, Non tender. ??(+)Bowel sounds.   Extremities: No c/c/e  Neurologic:?? Alert and oriented X 3.    Psych:???? Good insight.??Not anxious nor agitated.    Lab Data Reviewed:   Recent Labs   Basename 07/23/11 0425 07/22/11 0430    WBC 5.5 4.8    HGB 10.1* 10.7*    HCT 31.4* 32.6*    PLT 172 170     Recent Labs   Basename 07/23/11 0425 07/22/11 0430    NA 137 139    K 3.8 3.5    CL 102 103    CO2 27 28    BUN 19 20    CREA 1.10 1.03    GLU 130* 136*    PHOS 3.9 2.8    CA 8.1* 7.4*     Recent Labs   Basename 07/23/11 0425 07/22/11 0430    SGOT -- --    GPT -- --    AP -- --    TBIL -- --    TP -- --    ALB 2.3* 2.3*    GLOB -- --    GGT -- --    AML -- --    LPSE -- --       ________________________________________________________________________  Patient Active Problem List   Diagnoses Code   ??? CAD (coronary artery disease) 414.00   ??? Anemia, unspecified 285.9   ??? Back pain 724.5   ??? Hyperlipidemia 272.4   ??? HTN (hypertension) 401.9   ??? NSTEMI (non-ST elevated myocardial infarction) 410.70   ??? DKA, type 2, not at goal 250.12   ??? Unspecified hypothyroidism 244.9   ???  PAD (peripheral artery disease) 447.9   ??? Ischemic toe ulcer 707.15   ??? CKD (chronic kidney disease) stage 3, GFR 30-59 ml/min 585.3   ??? Foot ulcer 707.15        Assessment:      Anemia and heme positive stool in a pt with a history of AVM's with stable Hgb.   Non STEMI, CAD, HTN   Diabetes mellitus   CKD stage 3   Dyslipidemia      Plan:  No signs of active bleeding at present. Patient is on Plavix, ASA and Heparin. No plans for scopes unless he has frank bleeding and/or evidence of hemodynamic instability secondary to GIB. Will continue to follow along with you. Thank you.        Signed By: Heide Guile, NP     07/23/2011  10:45 AM

## 2011-07-23 NOTE — Progress Notes (Signed)
[place] [facility]  [address] [place], [place] [address]  [phone]      Medical Progress Note      NAME: Travis [name]   DOB:  November 28, 1940  MRM:  [id number]    Date/Time: 07/23/2011  8:01 AM         Subjective:     Chief Complaint:  Cough.    Has cough, nonprod.  No cp or sob.  No fever or chills.  Denies n/v.  No abd pain.  Has been ambulatory in room.    ROS:  (bold if positive, if negative)    Cough          Objective:       Vitals:          Last 24hrs VS reviewed since prior progress note. Most recent are:    Visit Vitals   Item Reading   ??? BP 157/60   ??? Pulse 77   ??? Temp 98.8 ??F (37.1 ??C)   ??? Resp 17   ??? Ht 5\' 6"  (1.676 m)   ??? Wt 189 lb   ??? BMI 30.51 kg/m2   ??? SpO2 96%     SpO2 Readings from Last 6 Encounters:   07/23/11 96%   05/25/11 97%   11/13/09 98%   11/13/09 98%   11/13/09 98%   10/17/09 100%    O2 Flow Rate (L/min): 2 l/min       Intake/Output Summary (Last 24 hours) at 07/23/11 0801  Last data filed at 07/23/11 1027   Gross per 24 hour   Intake 2442.5 ml   Output   2300 ml   Net  142.5 ml          Exam:     Physical Exam:    Gen:  Well-developed, well-nourished, in no acute distress  HEENT:  Pink conjunctivae, PERRL, hearing intact to voice, moist mucous membranes  Neck:  Supple, without masses, thyroid non-tender  Resp:  No accessory muscle use, scant bibasilar ralest, no wheezes or rhonchi  Card:  No murmurs, normal S1, S2 without thrills or peripheral edema  Abd:  Soft, non-tender, non-distended, normoactive bowel sounds are present  Musc:  No cyanosis or clubbing, ulcer left great toe and 2nd toe with necrotic tissue and odor without drainage  Skin:  No rashes, skin turgor is decreased  Neuro:  Cranial nerves 3-12 are grossly intact, grip strength is 5/5 bilaterally and dorsi / plantarflexion is 5/5 bilaterally, follows commands appropriately  Psych:  Good insight, oriented to person, place and time, alert       Telemetry reviewed:   Sinus rhythm intermittent pacing     Medications Reviewed: (see below)    Lab Data Reviewed: (see below)    ______________________________________________________________________    Medications:     Current Facility-Administered Medications   Medication Dose Route Frequency   ??? heparinized saline 2 units/mL infusion 1,000 Units  500 mL Irrigation ONCE   ??? heparinized saline 2 units/mL infusion 1,000 Units  500 mL IntraarTERial ONCE   ??? acetaminophen (TYLENOL) tablet 650 mg  650 mg Oral Q4H PRN   ??? morphine injection 2 mg  2 mg IntraVENous Q4H PRN   ??? diphenhydrAMINE (BENADRYL) injection 50 mg  50 mg IntraVENous Q4H PRN   ??? famotidine (PF) (PEPCID) injection 20 mg  20 mg IntraVENous ONCE PRN   ??? hydrocortisone Sod Succ (PF) (SOLU-CORTEF) injection 100 mg  100 mg IntraVENous ONCE PRN   ???  ondansetron (ZOFRAN) injection 4 mg  4 mg IntraVENous ONCE PRN   ??? 0.9% sodium chloride infusion  75 mL/hr IntraVENous CONTINUOUS   ??? prochlorperazine (COMPAZINE) injection 5 mg  5 mg IntraVENous Q4H PRN   ??? isosorbide mononitrate ER (IMDUR) tablet 30 mg  30 mg Oral DAILY   ??? insulin regular (NOVOLIN, HUMULIN) 100 Units in 0.9% sodium chloride 100 mL infusion  1-50 Units/hr IntraVENous TITRATE   ??? dextrose (D50W) injection Syrg 12.5-25 g  12.5-25 g IntraVENous PRN   ??? ferrous sulfate tablet 325 mg  1 Tab Oral BID WITH MEALS   ??? metoprolol (LOPRESSOR) tablet 75 mg  75 mg Oral BID   ??? atorvastatin (LIPITOR) tablet 40 mg  40 mg Oral QHS   ??? epoetin alfa (EPOGEN;PROCRIT) injection 10,000 Units  10,000 Units SubCUTAneous Q7D   ??? pantoprazole (PROTONIX) injection 40 mg  40 mg IntraVENous Q12H   ??? glucose chewable tablet 16 g  4 Tab Oral PRN   ??? dextrose (D50W) injection Syrg 12.5-25 g  12.5-25 g IntraVENous PRN   ??? glucagon (GLUCAGEN) injection 1 mg  1 mg IntraMUSCular PRN   ??? levothyroxine (SYNTHROID) tablet 150 mcg  150 mcg Oral ACB   ??? terazosin (HYTRIN) capsule 10 mg  10 mg Oral QHS   ??? lisinopril (PRINIVIL, ZESTRIL) tablet 20 mg  20 mg Oral QHS   ??? misoprostol  (CYTOTEC) tablet 200 mcg  200 mcg Oral BID   ??? albuterol (PROVENTIL HFA, VENTOLIN HFA) inhaler 2 Puff  2 Puff Inhalation Q6H PRN   ??? clopidogrel (PLAVIX) tablet 75 mg  75 mg Oral DAILY   ??? oxyCODONE-acetaminophen (PERCOCET 7.5) 7.5-325 mg per tablet 1 Tab  1 Tab Oral Q4H PRN   ??? morphine injection 2 mg  2 mg IntraVENous Q5MIN PRN   ??? zolpidem (AMBIEN) tablet 5 mg  5 mg Oral QHS PRN   ??? acetaminophen (TYLENOL) tablet 650 mg  650 mg Oral Q4H PRN   ??? docusate sodium (COLACE) capsule 100 mg  100 mg Oral BID   ??? bisacodyl (DULCOLAX) suppository 10 mg  10 mg Rectal DAILY PRN   ??? fish oil-omega-3 fatty acids 340-1,000 mg capsule 1 Cap  1 Cap Oral BID            Lab Review:     Recent Labs   Basename 07/23/11 0425 07/22/11 0430 07/21/11 0150    WBC 5.5 4.8 6.0    HGB 10.1* 10.7* 10.8*    HCT 31.4* 32.6* 32.5*    PLT 172 170 163     Recent Labs   Basename 07/23/11 0425 07/22/11 0430 07/21/11 0150    NA 137 139 137    K 3.8 3.5 4.0    CL 102 103 100    CO2 27 28 29     GLU 130* 136* 176*    BUN 19 20 24*    CREA 1.10 1.03 1.45*    CA 8.1* 7.4* 7.5*    MG -- -- 1.8    PHOS 3.9 2.8 2.8    ALB 2.3* 2.3* 2.5*    TBIL -- -- --    SGOT -- -- --    INR -- -- --     Lab Results   Component Value Date/Time    POC GLUCOSE 188 07/23/2011  7:48 AM    POC GLUCOSE 198 07/23/2011  6:43 AM    POC GLUCOSE 173 07/23/2011  5:38 AM    POC GLUCOSE 127 07/23/2011  4:26 AM    POC GLUCOSE 83 07/23/2011  3:05 AM            Assessment / Plan:     DM (05/16/2011):  Glucose well controlled on insulin gtt.  Last 18 hours insulin requirement was 65 unit.  Routinely, would start 1/2 of the last 18 hour requirement with long acting, but with recent hx of hypoglycemia, would be more conservative.   -- start lantus 20 units daily for now   -- SSI high sensitivity    NSTEMI (non-ST elevated myocardial infarction) / CAD (coronary artery disease) / HTN / NSVT / Chronic diastolic and systolic CHF:  Appreciate cardiology evaluation.  Weight stable.  Denies cp or sob.   Off heparin.   -- continue ASA, Plavix, lisinopril, metoprolol    Anemia, unspecified (09/29/2009):  S/p transfusion.  Stable hgb post transfusion.   -- continue iron and procrit   -- monitor hgb     Hyperlipidemia (10/06/2009)    -- continue statin and fish oil     Unspecified hypothyroidism (05/16/2011)    -- continue LT4    PAD (peripheral artery disease) (05/18/2011) / Foot ulcer (07/18/2011):  Appreciate wound care and vascular eval.  Seems more consistent with dry gangrene.  Afebrile and no leukocytosis to suggest infection.  Xray without radiologic evid for osteo.  Appreciate ID eval.   -- continue local wound care   -- to f/u with vascular surgery    CKD (chronic kidney disease) stage 3, GFR 30-59 ml/min (07/18/2011):  Creat stable overnight.  Appreciate renal eval.   -- follow creat esp post cath    Total time spent with patient: 25 Minutes                  Care Plan discussed with: Patient and Nursing Staff    Discussed:  Care Plan    Prophylaxis:  Hep SQ and H2B/PPI    Disposition:  Home w/Family           ___________________________________________________    Attending Physician: Suzan Garibaldi, MD

## 2011-07-23 NOTE — Progress Notes (Addendum)
Cardiology Progress Note - [facility]        NAME:  Travis [name]   DOB:   71   MRN:   [id number]     Assessment/Plan:   1) NSTEMI / CAD -recurrent ( last 05/17/11)-peak trop 27.   -cont medical management.   -  ASA, statin, plavix, and nitrate., increase BB   - okay to d/c from cardiac.standpoint  - f/u OP. Review possible cross of LCX given recurrent MI's.     2) New systolic dysfunction- EF 60%-> 40/45%- no decompensated Heart failure          - cont BB and ACE-I    3) HTN- BP120-160 - increase BB     4) Anemia   - stool is hemoccult positive.  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   - Would recommend trying to keep the Hgb in 9-10 range.   - Continue Procrit and iron replacement going forward.   - GI following.    5) CKD - Cr 1.1, near his baseline.    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  Agree with above.  Travis [name] cath shows chronic LCX and RCA lesions (along with distal LAD stenosis), which are not felt to be good targets for revascularization by Travis [name].  I will continue with medical CAD management.   I suspect his recurrent MI's are due to the chronic LCX lesion as this is not collateralized.  This was stented by Dr., Travis [name] in the past which did offer Travis [name] > 1 year period free of MI's.  I will have Travis [name] see Travis [name] to see whether repeat PCI of this lesion is appropriate (due to GI bleeding, may prefer to use a BMS if possible).     Travis Aquas, MD, [facility]            Subjective:   Travis [name] is a 71 y.o. year old male w/ hx:  CAD , PAD, HTN, DM, OSA ( CPAP w/ O2),  Chronic anemia, gangrene right toes was admitted for NSTEMI. [name] presented to the Emergency Department complaining of weakness: moderate to severe,  constant, present for several days, associated with nausea and vomiting but denied any chest pain.     CARDIAC EVALUATION   CATH: 2003 and 2006 - chronic LCX occlusion   STRESS: 02/09/07. The type of test was a dobutamine stress perfusion imaging. Cardiac stress testing was negative for chest pain and myocardial ischemia. Myocardial perfusion imaging showed reversible defect(s) involving the inferior wall. Gated EF (%): 55.   ECHO: 01/19/07. Echocardiogram demonstrated normal LV wall motion and ejection fraction, LVH and left atrial enlargement.   CAROTID: 2009 bilateral 50-79% stenosis.   CAROTIDS: 07/08/08 - 50-79% stenosis bilat   ECHO: 04/03/09 - TDS, moderate LVH, LVEF 65%; moderate LAE, RAE, mild AS (mean gradient 10), mild MR   ECHO: 1/11 - mild LVH, LAE, EF 55%, mild Pulm HTN, mild MR   CATH:06/05/09- DES to RCA   CATH: 06/22/09 - LAD 50%, LCX 100%, mid RCA patent stent, distal RCA 40%   ECHO: 06/22/09 - mild LVH, EF 55-60%, mild LAE, mild MR/TR, PASP 60   CATH: 07/18/09 - patent stent and stable  findings; EF 60%   CATH: 10/16/09 - 85% ulcerated plaque mid RCA successfully stented with 2.75 Xience. CTO Cx successful PTCA and DES of proximal part with 2.25 Atom.   CATH: 05/20/11: d LM: 30 %. pLAD 30%. dLAD 80%. mLCX 100% prior to stent. OM1:80%. mRCA:100 % at the site of a prior stent.- Med tx   ECHO: 07/17/11: EF: 45%, mod LVH, moderate HK basal-mid inferoseptal and   basal-mid inferior wall(s). Grade 2 DD, LAE.   CATH: 2/25/2013Obstructive 3VD: LM d30. LAD m30, apical 70. LCx m100 (chronic); OM1 ost80. RCA p80 (diffuse), m100 (chronic). EF 40% with basal-mid inferior AK.   No AVG, MR 1+      Cardiac ROS: no further pain, BP 120-160's Patient denies any exertional chest pain, dyspnea, palpitations, syncope, orthopnea, edema or paroxysmal nocturnal dyspnea.    Review of Systems: insulin gtt, non-productive cough. No nausea, indigestion, vomiting, pain, sputum. No bleeding. Appetite good. Walking and up in chair.         Objective:     Visit Vitals   Item Reading   ??? BP 157/60   ??? Pulse 77   ??? Temp 98.8 ??F (37.1 ??C)   ??? Resp 17   ??? Ht 5\' 6"  (1.676 m)   ??? Wt 85.73 kg (189 lb)   ??? BMI 30.51 kg/m2   ??? SpO2 96%      O2 Flow Rate (L/min): 2 l/min O2 Device: Room air    Temp (24hrs), Avg:98.5 ??F (36.9 ??C), Min:97.6 ??F (36.4 ??C), Max:98.8 ??F (37.1 ??C)      Intake/Output Summary (Last 24 hours) at 07/23/11 0840  Last data filed at 07/23/11 0648   Gross per 24 hour   Intake 2442.5 ml   Output   2300 ml   Net  142.5 ml       TELE: SR in 80s    General: AAOx3 cooperative, no acute distress.  HEENT: Atraumatic. Pink and moist.  Anicteric sclerae.  Neck: Supple, no thyromegaly.   Lungs: Room air. CTA bilaterally. No wheezing/rhonchi/rales.  Heart: Regular rhythm, no murmur, no S3, no rubs, no gallops. No JVD. No carotid bruits.   Abdomen: Soft, non-distended, non-tender. + Bowel sounds. No bruits.  Extremities: +1 BLLE edema L>R, no clubbing, no cyanosis. No calf tenderness, right foot ulcers under bandage w/ wound care   Groin: soft, no hemtoma.   Neurologic: Grossly intact.  Alert and oriented X 3.  No acute neurological distress.   Psych: Good insight. Not anxious nor agitated.    Care Plan discussed with:    Comments   Patient y    Family      RN y    Care Manager                    Consultant:          Data Review:   CMP: Lab Results   Component Value Date/Time    NA 137 07/23/2011  4:25 AM    K 3.8 07/23/2011  4:25 AM    CL 102 07/23/2011  4:25 AM    CO2 27 07/23/2011  4:25 AM    AGAP 8 07/23/2011  4:25 AM    GLU 130* 07/23/2011  4:25 AM    BUN 19 07/23/2011  4:25 AM    CREA 1.10 07/23/2011  4:25 AM    GFRAA >60 07/23/2011  4:25 AM    GFRNA >60 07/23/2011  4:25 AM    CA 8.1*  07/23/2011  4:25 AM    PHOS 3.9 07/23/2011  4:25 AM    ALB 2.3* 07/23/2011  4:25 AM     CBC:   Lab Results   Component Value Date/Time    WBC 5.5 07/23/2011  4:25 AM    HGB 10.1* 07/23/2011  4:25 AM    HCT 31.4* 07/23/2011  4:25 AM    PLT 172 07/23/2011  4:25 AM     All Cardiac  Markers in the last 24 hours:   No results found for this basename: CPK,  CK,  CPKMB,  CKRMB,  CKMMB,  CKMB,  RCK3,  CKMBT,  CKMBPC,  CKSMB,  CKNDX,  CKND1,  MYO,  TROQR,  TROPT,  TROIQ,  TROIP,  TROI,  TROPIT,  TROPT,  TRPOIT,  ITNL,  TNIPOC,  BNP,  BNPP,  PBNP     Recent Glucose Results: Lab Results   Component Value Date/Time    GLPOC 188* 07/23/2011  7:48 AM    GLPOC 198* 07/23/2011  6:43 AM    GLPOC 173* 07/23/2011  5:38 AM    GLU 130* 07/23/2011  4:25 AM     ABG:   No results found for this basename: ph,  phi,  pco2,  pco2i,  po2,  po2i,  hco3,  hco3i,  fio2,  fio2i         Medications reviewed  Current Facility-Administered Medications   Medication Dose Route Frequency   ??? insulin glargine (LANTUS) injection 20 Units  20 Units SubCUTAneous DAILY   ??? heparin (porcine) injection 5,000 Units  5,000 Units SubCUTAneous Q8H   ??? pantoprazole (PROTONIX) tablet 40 mg  40 mg Oral ACB   ??? heparinized saline 2 units/mL infusion 1,000 Units  500 mL Irrigation ONCE   ??? heparinized saline 2 units/mL infusion 1,000 Units  500 mL IntraarTERial ONCE   ??? acetaminophen (TYLENOL) tablet 650 mg  650 mg Oral Q4H PRN   ??? morphine injection 2 mg  2 mg IntraVENous Q4H PRN   ??? diphenhydrAMINE (BENADRYL) injection 50 mg  50 mg IntraVENous Q4H PRN   ??? famotidine (PF) (PEPCID) injection 20 mg  20 mg IntraVENous ONCE PRN   ??? hydrocortisone Sod Succ (PF) (SOLU-CORTEF) injection 100 mg  100 mg IntraVENous ONCE PRN   ??? ondansetron (ZOFRAN) injection 4 mg  4 mg IntraVENous ONCE PRN   ??? 0.9% sodium chloride infusion  75 mL/hr IntraVENous CONTINUOUS   ??? prochlorperazine (COMPAZINE) injection 5 mg  5 mg IntraVENous Q4H PRN   ??? isosorbide mononitrate ER (IMDUR) tablet 30 mg  30 mg Oral DAILY   ??? insulin regular (NOVOLIN, HUMULIN) 100 Units in 0.9% sodium chloride 100 mL infusion  1-50 Units/hr IntraVENous TITRATE   ??? dextrose (D50W) injection Syrg 12.5-25 g  12.5-25 g IntraVENous PRN   ??? ferrous sulfate tablet 325 mg  1 Tab Oral BID WITH MEALS   ???  metoprolol (LOPRESSOR) tablet 75 mg  75 mg Oral BID   ??? atorvastatin (LIPITOR) tablet 40 mg  40 mg Oral QHS   ??? epoetin alfa (EPOGEN;PROCRIT) injection 10,000 Units  10,000 Units SubCUTAneous Q7D   ??? glucose chewable tablet 16 g  4 Tab Oral PRN   ??? dextrose (D50W) injection Syrg 12.5-25 g  12.5-25 g IntraVENous PRN   ??? glucagon (GLUCAGEN) injection 1 mg  1 mg IntraMUSCular PRN   ??? levothyroxine (SYNTHROID) tablet 150 mcg  150 mcg Oral ACB   ??? terazosin (HYTRIN) capsule 10 mg  10 mg Oral  QHS   ??? lisinopril (PRINIVIL, ZESTRIL) tablet 20 mg  20 mg Oral QHS   ??? misoprostol (CYTOTEC) tablet 200 mcg  200 mcg Oral BID   ??? albuterol (PROVENTIL HFA, VENTOLIN HFA) inhaler 2 Puff  2 Puff Inhalation Q6H PRN   ??? clopidogrel (PLAVIX) tablet 75 mg  75 mg Oral DAILY   ??? oxyCODONE-acetaminophen (PERCOCET 7.5) 7.5-325 mg per tablet 1 Tab  1 Tab Oral Q4H PRN   ??? morphine injection 2 mg  2 mg IntraVENous Q5MIN PRN   ??? zolpidem (AMBIEN) tablet 5 mg  5 mg Oral QHS PRN   ??? acetaminophen (TYLENOL) tablet 650 mg  650 mg Oral Q4H PRN   ??? docusate sodium (COLACE) capsule 100 mg  100 mg Oral BID   ??? bisacodyl (DULCOLAX) suppository 10 mg  10 mg Rectal DAILY PRN   ??? fish oil-omega-3 fatty acids 340-1,000 mg capsule 1 Cap  1 Cap Oral BID         Alphonzo Lemmings RN, NP Student  Sherin Quarry, ACNP

## 2011-07-24 LAB — GLUCOSE, POC
Glucose (POC): 141 mg/dL — ABNORMAL HIGH (ref 75–110)
Glucose (POC): 355 mg/dL — ABNORMAL HIGH (ref 75–110)
Glucose (POC): 275 mg/dL — ABNORMAL HIGH (ref 75–110)

## 2011-07-24 LAB — RENAL FUNCTION PANEL
Albumin: 2.5 g/dL — ABNORMAL LOW (ref 3.5–5.0)
Anion gap: 9 mmol/L (ref 5–15)
BUN/Creatinine ratio: 17 (ref 12–20)
BUN: 19 MG/DL (ref 6–20)
CO2: 27 MMOL/L (ref 21–32)
Calcium: 8.7 MG/DL (ref 8.5–10.1)
Chloride: 101 MMOL/L (ref 97–108)
Creatinine: 1.1 MG/DL (ref 0.45–1.15)
GFR est AA: >60 mL/min/{1.73_m2} (ref >60)
GFR est non-AA: >60 mL/min/{1.73_m2} (ref >60)
Glucose: 257 MG/DL — ABNORMAL HIGH (ref 65–100)
Phosphorus: 4.8 MG/DL (ref 2.5–4.9)
Potassium: 4.1 MMOL/L (ref 3.5–5.1)
Sodium: 137 MMOL/L (ref 136–145)

## 2011-07-24 MED ORDER — FISH OIL-OMEGA-3 FATTY ACIDS 1,000 MG-340 MG CAP
340-1000 mg | ORAL_CAPSULE | Freq: Two times a day (BID) | ORAL | Status: AC
Start: 2011-07-24 — End: ?

## 2011-07-24 MED ORDER — INSULIN GLARGINE 100 UNIT/ML INJECTION
100 unit/mL | SUBCUTANEOUS | Status: DC
Start: 2011-07-24 — End: 2011-09-02

## 2011-07-24 MED ORDER — INSULIN GLARGINE 100 UNIT/ML INJECTION
100 unit/mL | SUBCUTANEOUS | Status: DC
Start: 2011-07-24 — End: 2011-07-24

## 2011-07-24 MED ORDER — BUMETANIDE 0.25 MG/ML IJ SOLN
0.25 mg/mL | Freq: Once | INTRAMUSCULAR | Status: AC
Start: 2011-07-24 — End: 2011-07-24
  Administered 2011-07-24: 17:00:00 via INTRAVENOUS

## 2011-07-24 MED ORDER — EPOETIN ALFA 10,000 UNIT/ML IJ SOLN
10000 unit/mL | INTRAMUSCULAR | Status: DC
Start: 2011-07-24 — End: 2011-07-24

## 2011-07-24 MED FILL — INSULIN LISPRO 100 UNIT/ML INJECTION: 100 unit/mL | SUBCUTANEOUS | Qty: 1

## 2011-07-24 MED FILL — INSULIN GLARGINE 100 UNIT/ML INJECTION: 100 unit/mL | SUBCUTANEOUS | Qty: 0.2

## 2011-07-24 MED FILL — ASPIRIN 81 MG CHEWABLE TAB: 81 mg | ORAL | Qty: 1

## 2011-07-24 MED FILL — PLAVIX 75 MG TABLET: 75 mg | ORAL | Qty: 1

## 2011-07-24 MED FILL — ISOSORBIDE MONONITRATE SR 30 MG 24 HR TAB: 30 mg | ORAL | Qty: 1

## 2011-07-24 MED FILL — PANTOPRAZOLE 40 MG TAB, DELAYED RELEASE: 40 mg | ORAL | Qty: 1

## 2011-07-24 MED FILL — FERROUS SULFATE 325 MG (65 MG ELEMENTAL IRON) TAB: 325 mg (65 mg iron) | ORAL | Qty: 1

## 2011-07-24 MED FILL — LIPITOR 20 MG TABLET: 20 mg | ORAL | Qty: 2

## 2011-07-24 MED FILL — LEVOTHYROXINE 150 MCG TAB: 150 mcg | ORAL | Qty: 1

## 2011-07-24 MED FILL — HEPARIN (PORCINE) 5,000 UNIT/ML IJ SOLN: 5000 unit/mL | INTRAMUSCULAR | Qty: 1

## 2011-07-24 MED FILL — LISINOPRIL 20 MG TAB: 20 mg | ORAL | Qty: 1

## 2011-07-24 MED FILL — FISH OIL 340 MG-1,000 MG CAPSULE: 340-1000 mg | ORAL | Qty: 1

## 2011-07-24 MED FILL — MISOPROSTOL 200 MCG TAB: 200 mcg | ORAL | Qty: 1

## 2011-07-24 MED FILL — METOPROLOL TARTRATE 50 MG TAB: 50 mg | ORAL | Qty: 2

## 2011-07-24 MED FILL — BUMETANIDE 0.25 MG/ML IJ SOLN: 0.25 mg/mL | INTRAMUSCULAR | Qty: 4

## 2011-07-24 NOTE — Progress Notes (Signed)
1630  Pt refusing bs check.      1730  Pt discharged home per md order.  All discharge instructions reviewed with patient and his daughter who is the primary care giver for patient.  Pt and daughter verbalized understanding.  All personal belongings sent home with patient.  2 copies of discharge instructions were sent home with patient as well.

## 2011-07-24 NOTE — Progress Notes (Signed)
[facility] Progress Note  [place] [facility]   [address] [place], [place] [address]   [phone]      Assessment & Plan:   1. CKD 3:Cr runs between 1.7 to 2.1 mg%   - cr is below his baseline   - Got IVF prior to contrast    - Resume bumex today:OK TO DC from my [place] on loops ( home dose)  - cr stable post cath       2. Anemia    -  stable   -  on epo   - s/p transfusion 2 units       3. NSTEMI:Severe CAD   -cath 3VD:Medical management         4.DM   5.HTN:170/64  - RESUME LOOPS   6. CAD   6.PAD   7. Foot ulcer         Subjective:   CC:f/up for ARF,CKD  HPI: Patient seen   1. ckd is stable  2. Anemia:stable  3. Plan for cardiac MRI out pt  ROS:no cp/sob/n/v/abd pain  Current Facility-Administered Medications   Medication Dose Route Frequency   ??? insulin glargine (LANTUS) injection 20 Units  20 Units SubCUTAneous DAILY   ??? heparin (porcine) injection 5,000 Units  5,000 Units SubCUTAneous Q8H   ??? pantoprazole (PROTONIX) tablet 40 mg  40 mg Oral ACB   ??? aspirin chewable tablet 81 mg  81 mg Oral DAILY   ??? metoprolol (LOPRESSOR) tablet 100 mg  100 mg Oral BID   ??? insulin lispro (HUMALOG) injection   SubCUTAneous AC&HS   ??? dextrose (D50W) injection Syrg 12.5-25 g  12.5-25 g IntraVENous PRN   ??? acetaminophen (TYLENOL) tablet 650 mg  650 mg Oral Q4H PRN   ??? morphine injection 2 mg  2 mg IntraVENous Q4H PRN   ??? diphenhydrAMINE (BENADRYL) injection 50 mg  50 mg IntraVENous Q4H PRN   ??? famotidine (PF) (PEPCID) injection 20 mg  20 mg IntraVENous ONCE PRN   ??? hydrocortisone Sod Succ (PF) (SOLU-CORTEF) injection 100 mg  100 mg IntraVENous ONCE PRN   ??? ondansetron (ZOFRAN) injection 4 mg  4 mg IntraVENous ONCE PRN   ??? prochlorperazine (COMPAZINE) injection 5 mg  5 mg IntraVENous Q4H PRN   ??? isosorbide mononitrate ER (IMDUR) tablet 30 mg  30 mg Oral DAILY   ??? dextrose (D50W) injection Syrg 12.5-25 g  12.5-25 g IntraVENous PRN   ??? ferrous sulfate tablet 325 mg  1 Tab  Oral BID WITH MEALS   ??? atorvastatin (LIPITOR) tablet 40 mg  40 mg Oral QHS   ??? epoetin alfa (EPOGEN;PROCRIT) injection 10,000 Units  10,000 Units SubCUTAneous Q7D   ??? glucose chewable tablet 16 g  4 Tab Oral PRN   ??? dextrose (D50W) injection Syrg 12.5-25 g  12.5-25 g IntraVENous PRN   ??? glucagon (GLUCAGEN) injection 1 mg  1 mg IntraMUSCular PRN   ??? levothyroxine (SYNTHROID) tablet 150 mcg  150 mcg Oral ACB   ??? terazosin (HYTRIN) capsule 10 mg  10 mg Oral QHS   ??? lisinopril (PRINIVIL, ZESTRIL) tablet 20 mg  20 mg Oral QHS   ??? misoprostol (CYTOTEC) tablet 200 mcg  200 mcg Oral BID   ??? albuterol (PROVENTIL HFA, VENTOLIN HFA) inhaler 2 Puff  2 Puff Inhalation Q6H PRN   ??? clopidogrel (PLAVIX) tablet 75 mg  75 mg Oral DAILY   ??? oxyCODONE-acetaminophen (PERCOCET 7.5) 7.5-325 mg per tablet 1 Tab  1 Tab Oral Q4H PRN   ??? morphine injection 2 mg  2 mg IntraVENous Q5MIN PRN   ??? zolpidem (AMBIEN) tablet 5 mg  5 mg Oral QHS PRN   ??? acetaminophen (TYLENOL) tablet 650 mg  650 mg Oral Q4H PRN   ??? docusate sodium (COLACE) capsule 100 mg  100 mg Oral BID   ??? bisacodyl (DULCOLAX) suppository 10 mg  10 mg Rectal DAILY PRN   ??? fish oil-omega-3 fatty acids 340-1,000 mg capsule 1 Cap  1 Cap Oral BID          Objective:     Vitals:  Blood pressure 170/64, pulse 89, temperature 98.8 ??F (37.1 ??C), resp. rate 19, height 5\' 6"  (1.676 m), weight 189 lb (85.73 kg), SpO2 98.00%.  Temp (24hrs), Avg:98.6 ??F (37 ??C), Min:98.3 ??F (36.8 ??C), Max:99 ??F (37.2 ??C)      Intake and Output:  02/27 0700 - 02/27 1859  In: -   Out: 400 [Urine:400]  02/25 1900 - 02/27 0659  In: 2092.5 [P.O.:1540; I.V.:552.5]  Out: 3000 [Urine:3000]    Physical Exam:                Patient is intubated:  no    Physical Examination:   GENERAL ASSESSMENT: NAD  HEENT:Nontraumatic   CHEST: CTA  HEART: S1S2  ABDOMEN: Soft,NT,  ZO:XWRUE: n  EXTREMITY: EDEMA 1  NEURO:Grossly non focal          ECG/rhythm:    Data Review      No results found for this basename: ITNL in the last 72 hours    No results found for this basename: CPK:3,CKMB:3,TROIQ:3, in the last 72 hours  Recent Labs   Basename 07/24/11 0339 07/23/11 0425 07/22/11 0430    NA 137 137 139    K 4.1 3.8 3.5    CL 101 102 103    CO2 27 27 28     BUN 19 19 20     CREA 1.10 1.10 1.03    GLU 257* 130* 136*    PHOS 4.8 3.9 2.8    MG -- -- --    ALB 2.5* 2.3* 2.3*    WBC -- 5.5 4.8    HGB -- 10.1* 10.7*    HCT -- 31.4* 32.6*    PLT -- 172 170     No results found for this basename: INR:3,PTP:3,APTT:3, in the last 72 hours  Needs: urine analysis, urine sodium, protein and creatinine  Lab Results   Component Value Date/Time    Sodium,urine random 59 05/21/2011  9:00 PM    Creatinine,urine random 32.49 05/21/2011  9:00 PM         Discussed with:  Nursing, pt    cards      [facility]  [address]  [place], [place] [address]  Phone - [phone]  Fax - [phone]

## 2011-07-24 NOTE — Progress Notes (Signed)
07/24/2011 10:19A Pt refused his 6 minute walk.Cardiology NP has been informed regarding refusal.Pt is on nocturnal oxygen at home which is bled through his [facility] .His home O2 is serviced by [facility].From a CM perspective,pt is ready for discharge.Marliss Czar

## 2011-07-24 NOTE — Discharge Summary (Signed)
Physician Discharge Summary     Patient ID:  Travis [name]  [id number]  71 y.o.  08-12-1940    Admit date: 07/18/2011    Discharge date and time: 07/24/2011    Admission Diagnoses: NSTEMI (non-ST elevated myocardial infarction)    Discharge Diagnoses/Hospital Course   DM (05/16/2011): Patient was placed on insulin gtt on two separate occasions this admission due to labile glucose with hyper and hypoglycemia. Insulin gtt has been stopped this AM and Lantus started in place. Will be discharged on 25 units of Lantus. Will need close PCP follow up as suspect pt will likely need further insulin modification.    NSTEMI (non-ST elevated myocardial infarction) / CAD (coronary artery disease) / HTN / Chronic diastolic and systolic CHF: Patient was continued on heparin IV along with ASA, Plavix, lisinopril, and metoprolol. Cath was done, but CAD was not amenable to PCI. Medical management was recommended by cardiology. Pt encouraged to have a viability study prior to attempts at revascularization, but he declined MRI. PET scan to be arranged as an outpatient.     Anemia, unspecified (09/29/2009): Patient did require transfusion this admission. Stable hgb post transfusion. Iron and procrit were continued. GI was consulted for heme positive stool. No plans for endoscopic evaluation have been made unless visible blood loss occurs.     Hyperlipidemia (10/06/2009): Patient was started on atorvastatin in place of simvastatin in order to optimize LDL    PAD (peripheral artery disease) (05/18/2011) / Foot ulcer (07/18/2011): Patient was seen by wound care. Patient has been afebrile and xrays were not consistent with osteo. Patient was seen by ID and vascular surgery and outpatient follow up with vascular surgery has been recommended. Cardiology has recommended waiting 30 days prior to surgical treatment if possible.     CKD (chronic kidney disease) stage 3, GFR 30-59 ml/min (07/18/2011): Patient was seen by nephrology. Creatinine has been  stable. Diuretics are to be restarted at discharge (bumex)    PCP: Phys Other, MD     Consults: GI, nephrology, cardiology     Discharge Exam:  Please refer to progress note from today.    Disposition: home     Patient Instructions:   Discharge Medication List as of 07/24/2011  4:00 PM      START taking these medications    Details   fish oil-omega-3 fatty acids 340-1,000 mg capsule Take 1 Cap by mouth two (2) times a day.Print, 1 Cap, Disp-60 Cap, R-0      atorvastatin (LIPITOR) 40 mg tablet Take 1 Tab by mouth nightly.Normal, 40 mg, Disp-30 Tab, R-3      isosorbide mononitrate ER (IMDUR) 30 mg tablet Take 1 Tab by mouth daily.Normal, 30 mg, Disp-30 Tab, R-3         CONTINUE these medications which have CHANGED    Details   insulin glargine (LANTUS) 100 unit/mL injection Please take 25 units of your glargine dailyNo Print, Disp-1 Vial, R-0      metoprolol (LOPRESSOR) 100 mg tablet Take 1 Tab by mouth two (2) times a day.Normal, 100 mg, Disp-60 Tab, R-3         CONTINUE these medications which have NOT CHANGED    Details   Ferrous Sulfate (SLOW FE) 47.5 mg iron TbER tablet Take 1 Tab by mouth two (2) times a day.Historical Med, 1 Tab      clopidogrel (PLAVIX) 75 mg tablet Take 1 Tab by mouth daily.Normal, 75 mg, Disp-30 Tab, R-6  albuterol (PROVENTIL HFA, VENTOLIN HFA) 90 mcg/actuation inhaler Take 2 Puffs by inhalation every six (6) hours as needed for Wheezing.Print, 2 Puff, Disp-1 Inhaler, R-1      potassium chloride (K-DUR, KLOR-CON) 10 mEq tablet Take 1 Tab by mouth daily.Normal, 10 mEq, Disp-30 Tab, R-6PT MAY HAVE WHATEVER FORMULATION HE NORMALLY HAS      insulin aspart (NOVOLOG) 100 unit/mL injection by SubCUTAneous route Before breakfast and dinner.  Historical Med      bumetanide (BUMEX) 2 mg tablet Take 2 mg by mouth daily.Historical Med, 2 mg      misoprostol (CYTOTEC) 200 mcg tablet Take 1 Tab by mouth two (2) times a day.Normal, 200 mcg, Disp-60 Tab, R-3      omeprazole (PRILOSEC) 20 mg capsule Take  20 mg by mouth daily.Historical Med, 20 mg      lisinopril (PRINIVIL, ZESTRIL) 20 mg tablet Take 1 Tab by mouth nightly.Print, 20 mg, Disp-30 Tab, R-6      terazosin (HYTRIN) 5 mg capsule 10 mg, Oral, EVERY BEDTIME, Until Discontinued, Historical Med      aspirin 81 mg tablet Oral, DAILY, Until Discontinued, Historical Med      omega-3 fatty acids-vitamin e (FISH OIL) 1,000 mg Cap Oral, 2 TIMES DAILY, Until Discontinued, Historical Med      levothyroxine (SYNTHROID) 150 mcg tablet Oral, DAILY BEFORE BREAKFAST, Until Discontinued, Historical Med      nitroglycerin (NITROSTAT) 0.4 mg SL tablet SubLINGual, EVERY 5 MIN AS NEEDED, Until Discontinued, Historical Med      ranitidine hcl (ZANTAC) 150 mg capsule 150 mg, Oral, 2 TIMES DAILY, Until Discontinued, Historical Med         STOP taking these medications       simvastatin (ZOCOR) 40 mg tablet Comments:   Reason for Stopping:               Activity: as tolerated   Diet: cardiac, diabetic   Wound Care: as per wound care     Follow-up with Dr. Welton Flakes in one week  Follow-up tests/labs: BMP    Approximate time spent in patient care on day of discharge: 42 minutes    Signed:  Gaspar Bidding, MD  07/24/2011  6:24 PM

## 2011-07-24 NOTE — Progress Notes (Signed)
Follow-up visit. Travis [name] is up in chair and shares that he is getting ready to be discharged to home. Has no concerns at this time.    Chaplain Intern: Cyndia Diver. Cobb    To contact pastoral care call:  [phone]

## 2011-07-24 NOTE — Progress Notes (Signed)
Cardiovascular MRI    Reviewed the indication, risk and benefits for cardiovascular MRI for myocardial Viability.   Even though he has a pacemaker [employer] DDD last interrogated in Nov 2012) he is not pacemaker dependent and hardly has any paced rhythm. He could undergo Cardiac MRI with safety precautions. I had at length discussion about the benefit of obtaining Viability information for revascularization and a small risk of dysrhythmia or lead heating. He can definitely undergo Cardiac MRI but he is claustrophobic. I offered conscious sedation for the claustrophobia but patient does not want to try that. In that case we cannot proceed with the cardiac MRI. I have informed this to Paulla Dolly, NP.     Latana Colin K. Dagoberto Reef, MD, [facility]

## 2011-07-24 NOTE — Progress Notes (Signed)
07/24/2011 09:53A CM faxed AVS and additional clinicals to [facility] .[facility] will be seeing pt for CHF management,telemonitoring,wound care,diabetes management,and PT tomorrow.Pt informed of plan of care.Marliss Czar

## 2011-07-24 NOTE — Progress Notes (Addendum)
Cardiology Progress Note - [facility]        NAME:  Travis [name]   DOB:   December 20, 1940   MRN:   [id number]     Assessment/Plan:   1) NSTEMI / CAD -recurrent ( last 05/17/11)-peak trop 27.             - need viability study prior to attempting to revasc- he has declined MRI ( claustrophobia). Will arrange thallium viability scan as OP and go from there              -  cont medical management: ASA, plavix, nitrates, BB .              - okay to d/c from cardiac.standpoint f/u 1 week Dr. Welton Flakes              - 6 min walk prior to DC   2) New systolic dysfunction- EF 60%-> 40/45%- wet today           - cont BB and ACE-I          - resume bumex today- will give IV dose   3) HTN- BP157-170- maybe driven by his volume           - cont BB, ACE-I     4) Anemia - Chronic disease ? AVMs , stable       -procrit, FeSo4   5) CKD - Cr 1.1, near his baseline.    6) PVD - followed by Dr. 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  Agree with above.  Tharon Aquas, MD, [facility]              Subjective:   Travis [name] is a 71 y.o. year old male w/ hx:  CAD , PAD, HTN, DM, OSA ( CPAP w/ O2),  Chronic anemia, gangrene right toes was admitted for NSTEMI. [name] presented to the Emergency Department complaining of weakness: moderate to severe, constant, present for several days, associated with nausea and vomiting but denied any chest pain.     CARDIAC EVALUATION   CATH: 2003 and 2006 - chronic LCX occlusion   STRESS: 02/09/07. The type of test was a dobutamine stress perfusion imaging. Cardiac stress testing was negative for chest pain and myocardial ischemia. Myocardial perfusion imaging showed reversible defect(s) involving the inferior wall. Gated EF (%): 55.   ECHO: 01/19/07. Echocardiogram demonstrated normal LV wall motion  and ejection fraction, LVH and left atrial enlargement.   CAROTID: 2009 bilateral 50-79% stenosis.   CAROTIDS: 07/08/08 - 50-79% stenosis bilat   ECHO: 04/03/09 - TDS, moderate LVH, LVEF 65%; moderate LAE, RAE, mild AS (mean gradient 10), mild MR   ECHO: 1/11 - mild LVH, LAE, EF 55%, mild Pulm HTN, mild MR   CATH:06/05/09- DES to RCA   CATH: 06/22/09 - LAD 50%, LCX 100%, mid RCA patent stent, distal RCA 40%   ECHO: 06/22/09 - mild LVH, EF 55-60%, mild LAE, mild MR/TR, PASP 60   CATH: 07/18/09 - patent stent and stable findings; EF 60%   CATH: 10/16/09 - 85% ulcerated plaque mid RCA successfully stented with 2.75 Xience. CTO Cx successful PTCA and DES of proximal part with 2.25 Atom.   CATH: 05/20/11: d LM: 30 %. pLAD 30%. dLAD 80%. mLCX 100%  prior to stent. OM1:80%. mRCA:100 % at the site of a prior stent.- Med tx   ECHO: 07/17/11: EF: 45%, mod LVH, moderate HK basal-mid inferoseptal and   basal-mid inferior wall(s). Grade 2 DD, LAE.   CATH: 2/25/2013Obstructive 3VD: LM d30. LAD m30, apical 70. LCx m100 (chronic); OM1 ost80. RCA p80 (diffuse), m100 (chronic). EF 40% with basal-mid inferior AK.   No AVG, MR 1+      Cardiac ROS: no further pain, BP 155-170's, unable to do MRI due to claustrophobia-pt declined, + orthopnea  Patient denies any exertional chest pain,   palpitations, syncope,   or paroxysmal nocturnal dyspnea.  Wt 188.3    Review of Systems:   non-productive cough. No nausea, indigestion, vomiting, pain, sputum. No bleeding. Appetite good. Walking and up in chair.        Objective:     Visit Vitals   Item Reading   ??? BP 170/64   ??? Pulse 89   ??? Temp 98.8 ??F (37.1 ??C)   ??? Resp 19   ??? Ht 5\' 6"  (1.676 m)   ??? Wt 189 lb (85.73 kg)   ??? BMI 30.51 kg/m2   ??? SpO2 98%      O2 Flow Rate (L/min): 2 l/min O2 Device: Room air    Temp (24hrs), Avg:98.6 ??F (37 ??C), Min:98.3 ??F (36.8 ??C), Max:99 ??F (37.2 ??C)      Intake/Output Summary (Last 24 hours) at 07/24/11 0924  Last data filed at 07/24/11 0858   Gross per 24 hour   Intake     600 ml   Output   1900 ml   Net  -1300 ml       TELE: SR in 80s/paced     General: AAOx3 cooperative, no acute distress.  HEENT: Atraumatic. Pink and moist.  Anicteric sclerae.  Neck: Supple, no thyromegaly.   Lungs: Room air. Crackles bibasilar   Heart: Regular rhythm, 2/6 systolic  murmur, no S3, no rubs, no gallops. + JVD. No carotid bruits.   Abdomen: Soft, mildly distended, non-tender. + Bowel sounds. No bruits.  Extremities: +1- 2+ BLLE edema L>R, no clubbing, no cyanosis. No calf tenderness, right foot ulcers under bandage w/ wound care   Groin: soft, no hemtoma.   Neurologic: Grossly intact.  Alert and oriented X 3.  No acute neurological distress.   Psych: Good insight. Not anxious nor agitated.    Care Plan discussed with:    Comments   Patient y    Family      RN y    Futures trader                    Consultant:  y Dr. Dagoberto Reef ( MRI)       Data Review:   CMP: Lab Results   Component Value Date/Time    NA 137 07/24/2011  3:39 AM    K 4.1 07/24/2011  3:39 AM    CL 101 07/24/2011  3:39 AM    CO2 27 07/24/2011  3:39 AM    AGAP 9 07/24/2011  3:39 AM    GLU 257* 07/24/2011  3:39 AM    BUN 19 07/24/2011  3:39 AM    CREA 1.10 07/24/2011  3:39 AM    GFRAA >60 07/24/2011  3:39 AM    GFRNA >60 07/24/2011  3:39 AM    CA 8.7 07/24/2011  3:39 AM    PHOS 4.8 07/24/2011  3:39 AM    ALB 2.5* 07/24/2011  3:39 AM       Component Value Date/Time   Cholesterol, total 123 05/17/2011  2:22 AM   HDL Cholesterol 55 05/17/2011  2:22 AM   LDL, calculated 58.4 05/17/2011  2:22 AM   Triglyceride 48 05/17/2011  2:22 AM   CHOL/HDL Ratio 2.2 05/17/2011  1:61 AM       Component Value Date/Time   ALT 16 07/18/2011  6:40 PM   AST 28 07/18/2011  6:40 PM   Alk. phosphatase 88 07/18/2011  6:40 PM   Bilirubin, direct 0.2 07/18/2011  6:40 PM   Bilirubin, total 0.7 07/18/2011  6:40 PM         Recent Glucose Results: Lab Results   Component Value Date/Time    GLPOC 355* 07/24/2011  7:57 AM    GLPOC 141* 07/23/2011 10:11 PM    GLPOC 183* 07/23/2011  3:58 PM    GLU 257*  07/24/2011  3:39 AM     ABG:   No results found for this basename: ph,  phi,  pco2,  pco2i,  po2,  po2i,  hco3,  hco3i,  fio2,  fio2i         Medications reviewed  Current Facility-Administered Medications   Medication Dose Route Frequency   ??? insulin glargine (LANTUS) injection 20 Units  20 Units SubCUTAneous DAILY   ??? heparin (porcine) injection 5,000 Units  5,000 Units SubCUTAneous Q8H   ??? pantoprazole (PROTONIX) tablet 40 mg  40 mg Oral ACB   ??? aspirin chewable tablet 81 mg  81 mg Oral DAILY   ??? metoprolol (LOPRESSOR) tablet 100 mg  100 mg Oral BID   ??? insulin lispro (HUMALOG) injection   SubCUTAneous AC&HS   ??? dextrose (D50W) injection Syrg 12.5-25 g  12.5-25 g IntraVENous PRN   ??? acetaminophen (TYLENOL) tablet 650 mg  650 mg Oral Q4H PRN   ??? morphine injection 2 mg  2 mg IntraVENous Q4H PRN   ??? diphenhydrAMINE (BENADRYL) injection 50 mg  50 mg IntraVENous Q4H PRN   ??? famotidine (PF) (PEPCID) injection 20 mg  20 mg IntraVENous ONCE PRN   ??? hydrocortisone Sod Succ (PF) (SOLU-CORTEF) injection 100 mg  100 mg IntraVENous ONCE PRN   ??? ondansetron (ZOFRAN) injection 4 mg  4 mg IntraVENous ONCE PRN   ??? prochlorperazine (COMPAZINE) injection 5 mg  5 mg IntraVENous Q4H PRN   ??? isosorbide mononitrate ER (IMDUR) tablet 30 mg  30 mg Oral DAILY   ??? dextrose (D50W) injection Syrg 12.5-25 g  12.5-25 g IntraVENous PRN   ??? DISCONTD: insulin regular (NOVOLIN, HUMULIN) 100 Units in 0.9% sodium chloride 100 mL infusion  1-50 Units/hr IntraVENous TITRATE   ??? ferrous sulfate tablet 325 mg  1 Tab Oral BID WITH MEALS   ??? atorvastatin (LIPITOR) tablet 40 mg  40 mg Oral QHS   ??? DISCONTD: metoprolol (LOPRESSOR) tablet 75 mg  75 mg Oral BID   ??? epoetin alfa (EPOGEN;PROCRIT) injection 10,000 Units  10,000 Units SubCUTAneous Q7D   ??? glucose chewable tablet 16 g  4 Tab Oral PRN   ??? dextrose (D50W) injection Syrg 12.5-25 g  12.5-25 g IntraVENous PRN   ??? glucagon (GLUCAGEN) injection 1 mg  1 mg IntraMUSCular PRN   ??? levothyroxine (SYNTHROID)  tablet 150 mcg  150 mcg Oral ACB   ??? terazosin (HYTRIN) capsule 10 mg  10 mg Oral QHS   ??? lisinopril (PRINIVIL, ZESTRIL) tablet 20 mg  20 mg Oral QHS   ??? misoprostol (CYTOTEC) tablet 200  mcg  200 mcg Oral BID   ??? albuterol (PROVENTIL HFA, VENTOLIN HFA) inhaler 2 Puff  2 Puff Inhalation Q6H PRN   ??? clopidogrel (PLAVIX) tablet 75 mg  75 mg Oral DAILY   ??? oxyCODONE-acetaminophen (PERCOCET 7.5) 7.5-325 mg per tablet 1 Tab  1 Tab Oral Q4H PRN   ??? morphine injection 2 mg  2 mg IntraVENous Q5MIN PRN   ??? zolpidem (AMBIEN) tablet 5 mg  5 mg Oral QHS PRN   ??? acetaminophen (TYLENOL) tablet 650 mg  650 mg Oral Q4H PRN   ??? docusate sodium (COLACE) capsule 100 mg  100 mg Oral BID   ??? bisacodyl (DULCOLAX) suppository 10 mg  10 mg Rectal DAILY PRN   ??? fish oil-omega-3 fatty acids 340-1,000 mg capsule 1 Cap  1 Cap Oral BID         Sherin Quarry, ACNP

## 2011-07-24 NOTE — Progress Notes (Signed)
[facility]  [address] [place], [place] [address]  [phone]      Medical Progress Note      NAME: Travis [name]   DOB:  02-03-1941  MRM:  [id number]    Date/Time: 07/24/2011  8:01 AM         Subjective:     Chief Complaint:  "Can I go home"  No complaints this AM. Wants to go home. BG elevated this AM. During the day on 2/26 good control on Lantus. No CP or SOB this AM. No bloody/black or tarry stools.     ROS:  (bold if positive, if negative)    Cough, non-productive            Objective:       Vitals:          Last 24hrs VS reviewed since prior progress note. Most recent are:    Visit Vitals   Item Reading   ??? BP 169/67   ??? Pulse 91   ??? Temp 99 ??F (37.2 ??C)   ??? Resp 18   ??? Ht 5\' 6"  (1.676 m)   ??? Wt 189 lb   ??? BMI 30.51 kg/m2   ??? SpO2 98%     SpO2 Readings from Last 6 Encounters:   07/24/11 98%   05/25/11 97%   11/13/09 98%   11/13/09 98%   11/13/09 98%   10/17/09 100%    O2 Flow Rate (L/min): 2 l/min       Intake/Output Summary (Last 24 hours) at 07/24/11 2130  Last data filed at 07/24/11 0300   Gross per 24 hour   Intake    950 ml   Output   1550 ml   Net   -600 ml          Exam:     Physical Exam:    Gen:  Well-developed, well-nourished, in no acute distress  HEENT:  Pink conjunctivae, PERRL, hearing intact to voice, moist mucous membranes  Neck:  Supple, without masses, thyroid non-tender  Resp:  No accessory muscle use, scant bibasilar ralest, no wheezes or rhonchi  Card:  No murmurs, normal S1, S2 without thrills or peripheral edema  Abd:  Soft, non-tender, non-distended, normoactive bowel sounds are present  Musc:  No cyanosis or clubbing, ulcer left great toe and 2nd toe with necrotic tissue and odor without drainage  Skin:  No rashes, skin turgor is decreased  Neuro:  Cranial nerves 3-12 are grossly intact, grip strength is 5/5 bilaterally and dorsi / plantarflexion is 5/5 bilaterally, follows commands appropriately  Psych:  Good insight, oriented to person, place  and time, alert       Telemetry reviewed:   Sinus rhythm; paced     Medications Reviewed: (see below)    Lab Data Reviewed: (see below)    ______________________________________________________________________    Medications:     Current Facility-Administered Medications   Medication Dose Route Frequency   ??? insulin glargine (LANTUS) injection 20 Units  20 Units SubCUTAneous DAILY   ??? heparin (porcine) injection 5,000 Units  5,000 Units SubCUTAneous Q8H   ??? pantoprazole (PROTONIX) tablet 40 mg  40 mg Oral ACB   ??? aspirin chewable tablet 81 mg  81 mg Oral DAILY   ??? metoprolol (LOPRESSOR) tablet 100 mg  100 mg Oral BID   ??? insulin lispro (HUMALOG) injection   SubCUTAneous AC&HS   ??? dextrose (D50W) injection Syrg 12.5-25 g  12.5-25 g  IntraVENous PRN   ??? acetaminophen (TYLENOL) tablet 650 mg  650 mg Oral Q4H PRN   ??? morphine injection 2 mg  2 mg IntraVENous Q4H PRN   ??? diphenhydrAMINE (BENADRYL) injection 50 mg  50 mg IntraVENous Q4H PRN   ??? famotidine (PF) (PEPCID) injection 20 mg  20 mg IntraVENous ONCE PRN   ??? hydrocortisone Sod Succ (PF) (SOLU-CORTEF) injection 100 mg  100 mg IntraVENous ONCE PRN   ??? ondansetron (ZOFRAN) injection 4 mg  4 mg IntraVENous ONCE PRN   ??? prochlorperazine (COMPAZINE) injection 5 mg  5 mg IntraVENous Q4H PRN   ??? isosorbide mononitrate ER (IMDUR) tablet 30 mg  30 mg Oral DAILY   ??? dextrose (D50W) injection Syrg 12.5-25 g  12.5-25 g IntraVENous PRN   ??? ferrous sulfate tablet 325 mg  1 Tab Oral BID WITH MEALS   ??? atorvastatin (LIPITOR) tablet 40 mg  40 mg Oral QHS   ??? epoetin alfa (EPOGEN;PROCRIT) injection 10,000 Units  10,000 Units SubCUTAneous Q7D   ??? glucose chewable tablet 16 g  4 Tab Oral PRN   ??? dextrose (D50W) injection Syrg 12.5-25 g  12.5-25 g IntraVENous PRN   ??? glucagon (GLUCAGEN) injection 1 mg  1 mg IntraMUSCular PRN   ??? levothyroxine (SYNTHROID) tablet 150 mcg  150 mcg Oral ACB   ??? terazosin (HYTRIN) capsule 10 mg  10 mg Oral QHS   ??? lisinopril (PRINIVIL, ZESTRIL) tablet 20 mg   20 mg Oral QHS   ??? misoprostol (CYTOTEC) tablet 200 mcg  200 mcg Oral BID   ??? albuterol (PROVENTIL HFA, VENTOLIN HFA) inhaler 2 Puff  2 Puff Inhalation Q6H PRN   ??? clopidogrel (PLAVIX) tablet 75 mg  75 mg Oral DAILY   ??? oxyCODONE-acetaminophen (PERCOCET 7.5) 7.5-325 mg per tablet 1 Tab  1 Tab Oral Q4H PRN   ??? morphine injection 2 mg  2 mg IntraVENous Q5MIN PRN   ??? zolpidem (AMBIEN) tablet 5 mg  5 mg Oral QHS PRN   ??? acetaminophen (TYLENOL) tablet 650 mg  650 mg Oral Q4H PRN   ??? docusate sodium (COLACE) capsule 100 mg  100 mg Oral BID   ??? bisacodyl (DULCOLAX) suppository 10 mg  10 mg Rectal DAILY PRN   ??? fish oil-omega-3 fatty acids 340-1,000 mg capsule 1 Cap  1 Cap Oral BID            Lab Review:     Recent Labs   Basename 07/23/11 0425 07/22/11 0430    WBC 5.5 4.8    HGB 10.1* 10.7*    HCT 31.4* 32.6*    PLT 172 170     Recent Labs   Basename 07/24/11 0339 07/23/11 0425 07/22/11 0430    NA 137 137 139    K 4.1 3.8 3.5    CL 101 102 103    CO2 27 27 28     GLU 257* 130* 136*    BUN 19 19 20     CREA 1.10 1.10 1.03    CA 8.7 8.1* 7.4*    MG -- -- --    PHOS 4.8 3.9 2.8    ALB 2.5* 2.3* 2.3*    TBIL -- -- --    SGOT -- -- --    INR -- -- --     Lab Results   Component Value Date/Time    POC GLUCOSE 355 07/24/2011  7:57 AM    POC GLUCOSE 141 07/23/2011 10:11 PM    POC GLUCOSE  183 07/23/2011  3:58 PM    POC GLUCOSE 148 07/23/2011  2:11 PM    POC GLUCOSE 146 07/23/2011 11:28 AM            Assessment / Plan:     DM (05/16/2011):  Glucose well controlled on insulin gtt.  Last 18 hours insulin requirement was 65 unit.  Started on 20u Lantus with good BG control since reinitiation with the exception of this AM    -- continue Lantus    -- ISS; provide coverage this AM    -- can discharge with close PCP follow-up if BG levels controlled this AM     NSTEMI (non-ST elevated myocardial infarction) / CAD (coronary artery disease) / HTN / NSVT / Chronic diastolic and systolic CHF:  Appreciate cardiology evaluation.  Weight stable.  No  longer with CP/SOB.    -- continue ASA, Plavix, lisinopril, metoprolol    Anemia, unspecified (09/29/2009):  S/p transfusion.  Stable hgb post transfusion.   -- continue iron and procrit   -- monitor hgb     Hyperlipidemia (10/06/2009)    -- continue statin and fish oil     Unspecified hypothyroidism (05/16/2011)    -- continue LT4    PAD (peripheral artery disease) (05/18/2011) / Foot ulcer (07/18/2011):  Appreciate wound care and vascular eval.  Seems more consistent with dry gangrene.  Afebrile and no leukocytosis to suggest infection.  Xray without radiologic evid for osteo.  Appreciate ID eval.   -- continue local wound care   -- to f/u with vascular surgery as outpatient     CKD (chronic kidney disease) stage 3, GFR 30-59 ml/min (07/18/2011):  Creatinine stable since 2/26   -- daily BMP while in house     Total time spent with patient: 25 Minutes                  Care Plan discussed with: Patient and Nursing Staff    Discussed:  Care Plan    Prophylaxis:  Hep SQ and H2B/PPI    Disposition:  Home w/Family           ___________________________________________________    Attending Physician: Gaspar Bidding, MD

## 2011-07-24 NOTE — Progress Notes (Signed)
Patient refused 6 minute walk.

## 2011-07-26 NOTE — Progress Notes (Signed)
Recently discharged from hospital, blood sugars labile, on insulin drip twice, wears cpap at night with oxygen bled in, cath done 2/25/113 and unable to vascularize LCX, stents x3, foot ulcer with tip of toe on left foot possible gangrene. Scheduled to have [facility] set him up for home telemonitoring.    Left message with patient to please call me back.  Phoned [facility] at [phone] to see if set up for telemonitoring. Spoke to [place], about home tele monitoring.  Transferred to Candise Bowens who is in charge of the CHF home tele monitoring.  States he previously refused tele monitoring.  In December stating he does not have CHF, she will check to see if he is accepting home tele monitoring this time.  Pincus Badder brief report on hospital stay and our concern with his CHF and diabetes.  Candise Bowens states they have been doing wound care for several months and toe does not seem to be healing.  She mentioned that he is in denial about the seriousness of his illnesses and they are continuing teaching.

## 2011-08-09 NOTE — Progress Notes (Signed)
Unable to reach patient, left message.    Phoned [facility] and Clayborne Dana confirmed patient is in their care.  Then spoke to [facility] who is going to look at patients tele monitoring and call me back.

## 2011-08-13 ENCOUNTER — Inpatient Hospital Stay: Payer: MEDICARE

## 2011-08-13 LAB — POC CHEM8
Anion gap (POC): 15 mmol/L (ref 5–15)
BUN (POC): 26 MG/DL — ABNORMAL HIGH (ref 9–20)
CO2 (POC): 26 MMOL/L (ref 21–32)
Calcium, ionized (POC): 1.08 MMOL/L — ABNORMAL LOW (ref 1.12–1.32)
Chloride (POC): 107 MMOL/L (ref 98–107)
Creatinine (POC): 1.5 MG/DL — ABNORMAL HIGH (ref 0.6–1.3)
GFRAA, POC: 56 mL/min/{1.73_m2} — ABNORMAL LOW (ref >60)
GFRNA, POC: 46 mL/min/{1.73_m2} — ABNORMAL LOW (ref >60)
Glucose (POC): 37 MG/DL — CL (ref 75–110)
Hematocrit (POC): 30 % — ABNORMAL LOW (ref 36.6–50.3)
Hemoglobin (POC): 10.2 GM/DL — ABNORMAL LOW (ref 12.1–17.0)
Potassium (POC): 3.6 MMOL/L (ref 3.5–5.1)
Sodium (POC): 144 MMOL/L (ref 136–145)

## 2011-08-13 LAB — GLUCOSE, POC
Glucose (POC): 100 mg/dL (ref 75–110)
Glucose (POC): 41 mg/dL — CL (ref 75–110)
Glucose (POC): 72 mg/dL — ABNORMAL LOW (ref 75–110)
Glucose (POC): 59 mg/dL — ABNORMAL LOW (ref 75–110)

## 2011-08-13 MED ORDER — FENTANYL CITRATE (PF) 50 MCG/ML IJ SOLN
50 mcg/mL | INTRAMUSCULAR | Status: AC
Start: 2011-08-13 — End: ?

## 2011-08-13 MED ORDER — OXYCODONE-ACETAMINOPHEN 5 MG-325 MG TAB
5-325 mg | Freq: Once | ORAL | Status: DC | PRN
Start: 2011-08-13 — End: 2011-08-13

## 2011-08-13 MED ORDER — SODIUM CHLORIDE 0.9 % IV
INTRAVENOUS | Status: DC
Start: 2011-08-13 — End: 2011-08-13

## 2011-08-13 MED ORDER — MIDAZOLAM 1 MG/ML IJ SOLN
1 mg/mL | INTRAMUSCULAR | Status: DC | PRN
Start: 2011-08-13 — End: 2011-08-13

## 2011-08-13 MED ORDER — FENTANYL CITRATE (PF) 50 MCG/ML IJ SOLN
50 mcg/mL | INTRAMUSCULAR | Status: DC | PRN
Start: 2011-08-13 — End: 2011-08-13
  Administered 2011-08-13: 18:00:00 via INTRAVENOUS

## 2011-08-13 MED ORDER — DROPERIDOL 2.5 MG/ML IJ SOLN
2.5 mg/mL | Freq: Once | INTRAMUSCULAR | Status: DC | PRN
Start: 2011-08-13 — End: 2011-08-13

## 2011-08-13 MED ORDER — MORPHINE 10 MG/ML INJ SOLUTION
10 mg/ml | INTRAMUSCULAR | Status: DC | PRN
Start: 2011-08-13 — End: 2011-08-13

## 2011-08-13 MED ORDER — SODIUM CHLORIDE 0.9 % IV PIGGY BACK
INTRAVENOUS | Status: DC
Start: 2011-08-13 — End: 2011-08-13

## 2011-08-13 MED ORDER — MEPERIDINE (PF) 25 MG/ML INJ SOLUTION
25 mg/ml | Freq: Once | INTRAMUSCULAR | Status: DC
Start: 2011-08-13 — End: 2011-08-13

## 2011-08-13 MED ORDER — DIPHENHYDRAMINE HCL 50 MG/ML IJ SOLN
50 mg/mL | INTRAMUSCULAR | Status: DC | PRN
Start: 2011-08-13 — End: 2011-08-13

## 2011-08-13 MED ORDER — MIDAZOLAM 1 MG/ML IJ SOLN
1 mg/mL | INTRAMUSCULAR | Status: DC | PRN
Start: 2011-08-13 — End: 2011-08-13
  Administered 2011-08-13: 18:00:00 via INTRAVENOUS

## 2011-08-13 MED ORDER — ONDANSETRON (PF) 4 MG/2 ML INJECTION
4 mg/2 mL | INTRAMUSCULAR | Status: DC | PRN
Start: 2011-08-13 — End: 2011-08-13

## 2011-08-13 MED ORDER — EPHEDRINE SULFATE 50 MG/ML IJ SOLN
50 mg/mL | INTRAMUSCULAR | Status: DC | PRN
Start: 2011-08-13 — End: 2011-08-13

## 2011-08-13 MED ORDER — DEXTROSE 50% IN WATER (D50W) IV SYRG
INTRAVENOUS | Status: DC
Start: 2011-08-13 — End: 2011-08-13

## 2011-08-13 MED ORDER — POVIDONE-IODINE 10 % OINTMENT
10 % | CUTANEOUS | Status: DC | PRN
Start: 2011-08-13 — End: 2011-08-13
  Administered 2011-08-13: 19:00:00 via TOPICAL

## 2011-08-13 MED ORDER — MIDAZOLAM 1 MG/ML IJ SOLN
1 mg/mL | INTRAMUSCULAR | Status: AC
Start: 2011-08-13 — End: ?

## 2011-08-13 MED ORDER — DEXTROSE 50% IN WATER (D50W) IV SYRG
Freq: Once | INTRAVENOUS | Status: AC
Start: 2011-08-13 — End: 2011-08-13
  Administered 2011-08-13: 20:00:00 via INTRAVENOUS

## 2011-08-13 MED ORDER — DEXTROSE 50% IN WATER (D50W) IV SYRG
INTRAVENOUS | Status: AC
Start: 2011-08-13 — End: 2011-08-13

## 2011-08-13 MED ORDER — LACTATED RINGERS IV
INTRAVENOUS | Status: DC
Start: 2011-08-13 — End: 2011-08-13
  Administered 2011-08-13: 16:00:00 via INTRAVENOUS

## 2011-08-13 MED ORDER — LACTATED RINGERS IV
INTRAVENOUS | Status: DC
Start: 2011-08-13 — End: 2011-08-13

## 2011-08-13 MED ORDER — LIDOCAINE (PF) 10 MG/ML (1 %) IJ SOLN
10 mg/mL (1 %) | INTRAMUSCULAR | Status: DC | PRN
Start: 2011-08-13 — End: 2011-08-13

## 2011-08-13 MED ORDER — OXYCODONE-ACETAMINOPHEN 7.5 MG-325 MG TAB
ORAL_TABLET | ORAL | Status: DC | PRN
Start: 2011-08-13 — End: 2011-09-02

## 2011-08-13 MED ORDER — DEXTROSE 50% IN WATER (D50W) IV SYRG
INTRAVENOUS | Status: AC
Start: 2011-08-13 — End: 2011-08-13
  Administered 2011-08-13: 16:00:00 via INTRAVENOUS

## 2011-08-13 MED ORDER — FENTANYL CITRATE (PF) 50 MCG/ML IJ SOLN
50 mcg/mL | INTRAMUSCULAR | Status: DC | PRN
Start: 2011-08-13 — End: 2011-08-13

## 2011-08-13 MED ORDER — CEFAZOLIN 1 GRAM SOLUTION FOR INJECTION
1 gram | INTRAMUSCULAR | Status: DC
Start: 2011-08-13 — End: 2011-08-13

## 2011-08-13 MED ORDER — DEXAMETHASONE SODIUM PHOSPHATE 4 MG/ML IJ SOLN
4 mg/mL | Freq: Once | INTRAMUSCULAR | Status: DC | PRN
Start: 2011-08-13 — End: 2011-08-13

## 2011-08-13 MED ORDER — HYDROMORPHONE (PF) 1 MG/ML IJ SOLN
1 mg/mL | INTRAMUSCULAR | Status: DC | PRN
Start: 2011-08-13 — End: 2011-08-13

## 2011-08-13 MED ORDER — CEFAZOLIN 1 GRAM SOLUTION FOR INJECTION
1 gram | INTRAMUSCULAR | Status: AC
Start: 2011-08-13 — End: 2011-08-13
  Administered 2011-08-13: 18:00:00 via INTRAVENOUS

## 2011-08-13 MED FILL — FENTANYL CITRATE (PF) 50 MCG/ML IJ SOLN: 50 mcg/mL | INTRAMUSCULAR | Qty: 2

## 2011-08-13 MED FILL — DEXTROSE 50% IN WATER (D50W) IV SYRG: INTRAVENOUS | Qty: 50

## 2011-08-13 MED FILL — SODIUM CHLORIDE 0.9 % IV: INTRAVENOUS | Qty: 1000

## 2011-08-13 MED FILL — MIDAZOLAM 1 MG/ML IJ SOLN: 1 mg/mL | INTRAMUSCULAR | Qty: 5

## 2011-08-13 MED FILL — MIDAZOLAM 1 MG/ML IJ SOLN: 1 mg/mL | INTRAMUSCULAR | Qty: 4

## 2011-08-13 MED FILL — SODIUM CHLORIDE 0.9 % IV PIGGY BACK: INTRAVENOUS | Qty: 50

## 2011-08-13 MED FILL — DEXTROSE 50% IN WATER (D50W) IV SYRG: INTRAVENOUS | Qty: 25

## 2011-08-13 MED FILL — CEFAZOLIN 1 GRAM SOLUTION FOR INJECTION: 1 gram | INTRAMUSCULAR | Qty: 1000

## 2011-08-13 MED FILL — LACTATED RINGERS IV: INTRAVENOUS | Qty: 1000

## 2011-08-13 NOTE — Other (Signed)
Dressing on left foot dry and intact. Foul odor eminating from the dressing. repear blood sugar done and resulted 100. Dr. Sherryll Burger made aware.

## 2011-08-13 NOTE — Other (Signed)
Dr. Carole Binning and Dr. Suszanne Finch aware of post op FSBS=59mg /dL .  D50 1/2 amp ordered.  Patient to be discharged home when awake , alert and tolerating po.

## 2011-08-13 NOTE — Progress Notes (Signed)
Ankle Block Note    Left Ankle nerve block:    Risks, benefits, alternatives explained at length and patient agrees to proceed.  Time out performed and site for block/surgery identified.  Standard monitors applied, 3 L NC O2, and sedation given as recorded by RN so as to achieve pt comfort and anxiolysis but maintain meaningful verbal contact.  Sterile prep followed by injection of 20 mL 2% lidocaine divided over 5 locations to block sural, saphenous, posterior tibial, superficial and deep peroneal nerves.  No pain, paresthesia, or heme noted. VSS throughout.  No complications noted.

## 2011-08-13 NOTE — Brief Op Note (Signed)
BRIEF OPERATIVE NOTE    Date of Procedure: 08/13/2011   Preoperative Diagnosis: GANGRENE LEFT GREAT TOE AND 2ND LEFT TOE  Postoperative Diagnosis: GANGRENE LEFT GREAT TOE AND 2ND LEFT TOE    Procedure: Procedure(s):  AMPUTATION LEFT GREAT TOE AND LEFT 2ND TOE (BLOCK ANES)  Surgeon(s) and Role:     * Lenn Sink, MD - Primary  Anesthesia: Other   Estimated Blood Loss:minimal  Specimens:   ID Type Source Tests Collected by Time Destination   1 : LEFT BIG TOE AND LEFT SECOND TOE Fresh Foot  Lenn Sink, MD 08/13/2011 1441 Pathology      Findings: healthy looking tissue  Complications: none  Implants: * No implants in log *

## 2011-08-13 NOTE — Progress Notes (Signed)
Post-Anesthesia Evaluation and Assessment    Patient: Travis [name] MRN: [id number]  SSN: [id number]    Date of Birth: 1940/09/01  Age: 71 y.o.  Sex: male       Cardiovascular Function/Vital Signs  Visit Vitals   Item Reading   ??? BP 157/64   ??? Pulse 84   ??? Temp 97.3 ??F (36.3 ??C)   ??? Resp 15   ??? Ht 5\' 6"  (1.676 m)   ??? Wt 83.462 kg (184 lb)   ??? BMI 29.70 kg/m2   ??? SpO2 100%       Patient is status post  Procedure(s) with comments:  AMPUTATION TOE - AMPUTATION LEFT GREAT TOE AND LEFT 2ND TOE (BLOCK ANES).    Nausea/Vomiting: none    Postoperative hydration reviewed and adequate  Pain:  Pain Scale 1: Numeric (0 - 10) (08/13/11 1517)  Pain Intensity 1: 0 (patient denies) (08/13/11 1517)   Pain managed    Neurological Status:   Neuro (WDL): Within Defined Limits (08/13/11 1547)   Neuro status at baseline    Mental Status and Level of Consciousness: Alert and oriented to person, place, and time    Pulmonary Status:   O2 Device: Room air (08/13/11 1547)   Adequate oxygenation and airway patent    Complications related to anesthesia: None  Post-anesthesia assessment completed. No concerns    Signed By: Marya Fossa, MD     August 13, 2011

## 2011-08-13 NOTE — Other (Signed)
Chem 8 shows low BS.  Repeat fingerstick done. Reported to dr. Sherryll Burger. Orders given.

## 2011-08-13 NOTE — Other (Signed)
Report off to Blair Hailey RN

## 2011-08-13 NOTE — Op Note (Signed)
Name:      Travis [name]                                          Surgeon:        Lenn Sink, MD  Account #: [id number]                 Surgery Date:   08/13/2011  DOB:       23-Mar-1941  Age:       71                           Location:                                 OPERATIVE REPORT      PREOPERATIVE DIAGNOSIS: Left first and second toe tip gangrene.    POSTOPERATIVE DIAGNOSIS: Left first and second toe tip gangrene.    OPERATIVE PROCEDURE: Amputation of distal left great and second toe.    SURGEON: Lenn Sink, MD    ANESTHESIA: Regional with sedation.    BLOOD LOSS: Minimal.    DRAINS: None.    COMPLICATIONS: None.    SPECIMEN: Left great and second toe tip.    INDICATIONS FOR PROCEDURE: The patient is a very pleasant, 70 year old  male, who has undergone previous revascularization of his left lower  extremity, which took 2 obliterations. He had developed toe tip gangrene on  both great and second toe. He presents for amputation of these areas.    DESCRIPTION OF PROCEDURE: The patient was placed supine on the operating  table and a regional block had been performed by the anesthesia department.  He received a small sedation, the left foot was prepped and draped amount  of sedation. The left foot was prepped and draped in the standard surgical  fashion. The necrotic portion was clamped with a penetrating towel clip and  a circumferential incision was made in fish mouth fashion around the toe  tip, taken down through the bone and the proximal mid phalanges were  excised using a bone cutter and rongeurs. The area was irrigated and closed  in layers of Vicryl and nylon in vertical mattress fashion.    Similarly, this was done to the second toe tip as well. A  portion of the  proximal phalanges were intact in both toes. Dry sterile dressings were  applied after Betadine ointment. The counts were correct at the end of the  case x2. The patient was awoke and transported to the PACU in stable  condition.           Lenn Sink, MD    cc:   Lenn Sink, MD        AM/wmx; D: 08/13/2011 03:18 P; T: 08/13/2011 03:44 P; Doc# [id number]; Job#  [id number]

## 2011-08-14 MED FILL — DIPRIVAN 10 MG/ML INTRAVENOUS EMULSION: 10 mg/mL | INTRAVENOUS | Qty: 20

## 2011-08-14 MED FILL — SENSORCAINE-MPF 0.5 % (5 MG/ML) INJECTION SOLUTION: 0.5 % (5 mg/mL) | INTRAMUSCULAR | Qty: 30

## 2011-08-14 MED FILL — LACTATED RINGERS IV: INTRAVENOUS | Qty: 1000

## 2011-08-21 ENCOUNTER — Inpatient Hospital Stay
Admit: 2011-08-21 | Discharge: 2011-09-03 | Disposition: A | Discharge status: Skilled Nursing Facility | Payer: MEDICARE | Attending: Internal Medicine | Admitting: Internal Medicine

## 2011-08-21 DIAGNOSIS — I214 Non-ST elevation (NSTEMI) myocardial infarction: Secondary | ICD-10-CM

## 2011-08-21 LAB — CBC WITH AUTOMATED DIFF
ABS. BASOPHILS: 0 10*3/uL (ref 0.0–0.1)
ABS. EOSINOPHILS: 0 10*3/uL (ref 0.0–0.4)
ABS. LYMPHOCYTES: 1 10*3/uL (ref 0.8–3.5)
ABS. MONOCYTES: 0.9 10*3/uL (ref 0.0–1.0)
ABS. NEUTROPHILS: 11.2 10*3/uL — ABNORMAL HIGH (ref 1.8–8.0)
BASOPHILS: 0 % (ref 0–1)
EOSINOPHILS: 0 % (ref 0–7)
HCT: 28.7 % — ABNORMAL LOW (ref 36.6–50.3)
HGB: 9.4 g/dL — ABNORMAL LOW (ref 12.1–17.0)
LYMPHOCYTES: 8 % — ABNORMAL LOW (ref 12–49)
MCH: 28.4 PG (ref 26.0–34.0)
MCHC: 32.8 g/dL (ref 30.0–36.5)
MCV: 86.7 FL (ref 80.0–99.0)
MONOCYTES: 7 % (ref 5–13)
NEUTROPHILS: 85 % — ABNORMAL HIGH (ref 32–75)
PLATELET: 179 10*3/uL (ref 150–400)
RBC: 3.31 M/uL — ABNORMAL LOW (ref 4.10–5.70)
RDW: 17.4 % — ABNORMAL HIGH (ref 11.5–14.5)
WBC: 13.2 10*3/uL — ABNORMAL HIGH (ref 4.1–11.1)

## 2011-08-21 LAB — METABOLIC PANEL, BASIC
Anion gap: 13 mmol/L (ref 5–15)
BUN/Creatinine ratio: 18 (ref 12–20)
BUN: 42 MG/DL — ABNORMAL HIGH (ref 6–20)
CO2: 22 MMOL/L (ref 21–32)
Calcium: 7.9 MG/DL — ABNORMAL LOW (ref 8.5–10.1)
Chloride: 96 MMOL/L — ABNORMAL LOW (ref 97–108)
Creatinine: 2.3 MG/DL — ABNORMAL HIGH (ref 0.45–1.15)
GFR est AA: 34 mL/min/{1.73_m2} — ABNORMAL LOW (ref >60)
GFR est non-AA: 28 mL/min/{1.73_m2} — ABNORMAL LOW (ref >60)
Glucose: 526 MG/DL — ABNORMAL HIGH (ref 65–100)
Potassium: 5.3 MMOL/L — ABNORMAL HIGH (ref 3.5–5.1)
Sodium: 131 MMOL/L — ABNORMAL LOW (ref 136–145)

## 2011-08-21 LAB — URINALYSIS W/ REFLEX CULTURE
Bacteria: NEGATIVE /HPF
Bilirubin: NEGATIVE
Glucose: >1000 MG/DL — AB
Ketone: 40 MG/DL — AB
Leukocyte Esterase: NEGATIVE
Nitrites: NEGATIVE
Protein: 30 MG/DL — AB
Specific gravity: 1.018 (ref 1.003–1.030)
Urobilinogen: 0.2 EU/DL (ref 0.2–1.0)
pH (UA): 5.5 (ref 5.0–8.0)

## 2011-08-21 LAB — POC CHEM8
Anion gap (POC): 17 mmol/L — ABNORMAL HIGH (ref 5–15)
BUN (POC): 46 MG/DL — ABNORMAL HIGH (ref 9–20)
CO2 (POC): 22 MMOL/L (ref 21–32)
Calcium, ionized (POC): 1.03 MMOL/L — ABNORMAL LOW (ref 1.12–1.32)
Chloride (POC): 100 MMOL/L (ref 98–107)
Creatinine (POC): 2.3 MG/DL — ABNORMAL HIGH (ref 0.6–1.3)
GFRAA, POC: 34 mL/min/{1.73_m2} — ABNORMAL LOW (ref >60)
GFRNA, POC: 28 mL/min/{1.73_m2} — ABNORMAL LOW (ref >60)
Glucose (POC): 523 MG/DL — ABNORMAL HIGH (ref 75–110)
Hematocrit (POC): 28 % — ABNORMAL LOW (ref 36.6–50.3)
Hemoglobin (POC): 9.5 GM/DL — ABNORMAL LOW (ref 12.1–17.0)
Potassium (POC): 6.4 MMOL/L — ABNORMAL HIGH (ref 3.5–5.1)
Sodium (POC): 131 MMOL/L — ABNORMAL LOW (ref 136–145)

## 2011-08-21 LAB — POC TROPONIN-I: Troponin-I (POC): 0.56 ng/mL — ABNORMAL HIGH (ref 0.00–0.08)

## 2011-08-21 LAB — GLUCOSE, POC: Glucose (POC): >600 mg/dL — CR (ref 75–110)

## 2011-08-21 MED ORDER — ALBUTEROL SULFATE 0.083 % (0.83 MG/ML) SOLN FOR INHALATION
2.5 mg /3 mL (0.083 %) | RESPIRATORY_TRACT | Status: DC | PRN
Start: 2011-08-21 — End: 2011-08-21

## 2011-08-21 MED ORDER — .PHARMACY TO SUBSTITUTE PER PROTOCOL
Status: DC
Start: 2011-08-21 — End: 2011-08-21

## 2011-08-21 MED ORDER — SODIUM CHLORIDE 0.9% BOLUS IV
0.9 % | Freq: Once | INTRAVENOUS | Status: AC
Start: 2011-08-21 — End: 2011-08-21
  Administered 2011-08-21: 21:00:00 via INTRAVENOUS

## 2011-08-21 MED ORDER — SODIUM CHLORIDE 0.9 % IV
INTRAVENOUS | Status: DC
Start: 2011-08-21 — End: 2011-08-24
  Administered 2011-08-21 – 2011-08-24 (×3): via INTRAVENOUS

## 2011-08-21 MED ORDER — ONDANSETRON (PF) 4 MG/2 ML INJECTION
4 mg/2 mL | INTRAMUSCULAR | Status: DC | PRN
Start: 2011-08-21 — End: 2011-09-03

## 2011-08-21 MED ORDER — FAMOTIDINE 20 MG TAB
20 mg | Freq: Two times a day (BID) | ORAL | Status: DC
Start: 2011-08-21 — End: 2011-08-22
  Administered 2011-08-21: 23:00:00 via ORAL

## 2011-08-21 MED ORDER — INSULIN GLARGINE 100 UNIT/ML INJECTION
100 unit/mL | Freq: Every evening | SUBCUTANEOUS | Status: DC
Start: 2011-08-21 — End: 2011-08-21

## 2011-08-21 MED ORDER — ASPIRIN 325 MG TAB
325 mg | ORAL | Status: AC
Start: 2011-08-21 — End: 2011-08-21
  Administered 2011-08-21: 21:00:00 via ORAL

## 2011-08-21 MED ORDER — INSULIN REGULAR HUMAN 100 UNIT/ML INJECTION
100 unit/mL | Freq: Once | INTRAMUSCULAR | Status: AC
Start: 2011-08-21 — End: 2011-08-21
  Administered 2011-08-21: 23:00:00 via SUBCUTANEOUS

## 2011-08-21 MED ORDER — SODIUM POLYSTYRENE SULFONATE 15 G/60 ML ORAL SUSP
15 gram/60 mL | ORAL | Status: AC
Start: 2011-08-21 — End: 2011-08-21
  Administered 2011-08-21: 21:00:00 via ORAL

## 2011-08-21 MED ORDER — FERROUS SULFATE 325 MG (65 MG ELEMENTAL IRON) TAB
325 mg (65 mg iron) | Freq: Two times a day (BID) | ORAL | Status: DC
Start: 2011-08-21 — End: 2011-09-03
  Administered 2011-08-22 – 2011-09-03 (×24): via ORAL

## 2011-08-21 MED ORDER — OXYCODONE-ACETAMINOPHEN 7.5 MG-325 MG TAB
ORAL | Status: DC | PRN
Start: 2011-08-21 — End: 2011-08-30
  Administered 2011-08-22 – 2011-08-30 (×9): via ORAL

## 2011-08-21 MED ORDER — INSULIN LISPRO 100 UNIT/ML INJECTION
100 unit/mL | Freq: Four times a day (QID) | SUBCUTANEOUS | Status: DC
Start: 2011-08-21 — End: 2011-08-21

## 2011-08-21 MED ORDER — ISOSORBIDE MONONITRATE SR 30 MG 24 HR TAB
30 mg | Freq: Every day | ORAL | Status: DC
Start: 2011-08-21 — End: 2011-09-02
  Administered 2011-08-22 – 2011-09-02 (×12): via ORAL

## 2011-08-21 MED ORDER — NITROGLYCERIN 0.4 MG SUBLINGUAL TAB
0.4 mg | SUBLINGUAL | Status: DC | PRN
Start: 2011-08-21 — End: 2011-09-03

## 2011-08-21 MED ORDER — SODIUM CHLORIDE 0.9 % IV
500 mg | INTRAVENOUS | Status: DC
Start: 2011-08-21 — End: 2011-08-25
  Administered 2011-08-22 – 2011-08-25 (×5): via INTRAVENOUS

## 2011-08-21 MED ORDER — DEXTROSE 50% IN WATER (D50W) IV SYRG
INTRAVENOUS | Status: DC | PRN
Start: 2011-08-21 — End: 2011-08-25
  Administered 2011-08-22 – 2011-08-24 (×4): via INTRAVENOUS

## 2011-08-21 MED ORDER — TERAZOSIN 5 MG CAP
5 mg | Freq: Every evening | ORAL | Status: DC
Start: 2011-08-21 — End: 2011-09-03
  Administered 2011-08-22 – 2011-09-03 (×13): via ORAL

## 2011-08-21 MED ORDER — ACETAMINOPHEN 325 MG TABLET
325 mg | ORAL | Status: DC | PRN
Start: 2011-08-21 — End: 2011-09-03
  Administered 2011-08-21 – 2011-09-03 (×5): via ORAL

## 2011-08-21 MED ORDER — INSULIN REGULAR HUMAN 100 UNIT/ML INJECTION
100 unit/mL | Freq: Three times a day (TID) | INTRAMUSCULAR | Status: DC
Start: 2011-08-21 — End: 2011-08-21

## 2011-08-21 MED ORDER — GLUCOSE 4 GRAM CHEWABLE TAB
4 gram | ORAL | Status: DC | PRN
Start: 2011-08-21 — End: 2011-09-03

## 2011-08-21 MED ORDER — GLUCAGON 1 MG INJECTION
1 mg | INTRAMUSCULAR | Status: DC | PRN
Start: 2011-08-21 — End: 2011-08-21

## 2011-08-21 MED ORDER — CLOPIDOGREL 75 MG TAB
75 mg | Freq: Every day | ORAL | Status: DC
Start: 2011-08-21 — End: 2011-09-03
  Administered 2011-08-22 – 2011-09-03 (×13): via ORAL

## 2011-08-21 MED ORDER — INSULIN REGULAR HUMAN 100 UNIT/ML INJECTION
100 unit/mL | INTRAMUSCULAR | Status: AC
Start: 2011-08-21 — End: 2011-08-21
  Administered 2011-08-21: 21:00:00 via SUBCUTANEOUS

## 2011-08-21 MED ORDER — MISOPROSTOL 200 MCG TAB
200 mcg | Freq: Two times a day (BID) | ORAL | Status: DC
Start: 2011-08-21 — End: 2011-09-03
  Administered 2011-08-21 – 2011-09-03 (×25): via ORAL

## 2011-08-21 MED ORDER — LEVOFLOXACIN IN D5W 750 MG/150 ML IV PIGGY BACK
750 mg/150 mL | INTRAVENOUS | Status: DC
Start: 2011-08-21 — End: 2011-08-21
  Administered 2011-08-21: 21:00:00 via INTRAVENOUS

## 2011-08-21 MED ORDER — LEVALBUTEROL 0.63 MG/3 ML SOLN FOR INHALATION
0.63 mg/3 mL | RESPIRATORY_TRACT | Status: DC | PRN
Start: 2011-08-21 — End: 2011-09-03
  Administered 2011-08-31: 03:00:00 via RESPIRATORY_TRACT

## 2011-08-21 MED ORDER — LEVOTHYROXINE 150 MCG TAB
150 mcg | Freq: Every day | ORAL | Status: DC
Start: 2011-08-21 — End: 2011-09-03
  Administered 2011-08-22 – 2011-09-03 (×13): via ORAL

## 2011-08-21 MED ORDER — GLUCOSE 4 GRAM CHEWABLE TAB
4 gram | ORAL | Status: DC | PRN
Start: 2011-08-21 — End: 2011-08-21

## 2011-08-21 MED ORDER — HEPARIN (PORCINE) 5,000 UNIT/ML IJ SOLN
5000 unit/mL | Freq: Three times a day (TID) | INTRAMUSCULAR | Status: DC
Start: 2011-08-21 — End: 2011-09-03
  Administered 2011-08-21 – 2011-09-03 (×36): via SUBCUTANEOUS

## 2011-08-21 MED ORDER — DEXTROSE 50% IN WATER (D50W) IV SYRG
INTRAVENOUS | Status: DC | PRN
Start: 2011-08-21 — End: 2011-08-21

## 2011-08-21 MED ORDER — GLUCAGON 1 MG INJECTION
1 mg | INTRAMUSCULAR | Status: DC | PRN
Start: 2011-08-21 — End: 2011-09-03

## 2011-08-21 MED ORDER — ATORVASTATIN 20 MG TAB
20 mg | Freq: Every evening | ORAL | Status: DC
Start: 2011-08-21 — End: 2011-09-03
  Administered 2011-08-22 – 2011-09-03 (×13): via ORAL

## 2011-08-21 MED ORDER — METOPROLOL TARTRATE 50 MG TAB
50 mg | Freq: Two times a day (BID) | ORAL | Status: DC
Start: 2011-08-21 — End: 2011-09-03
  Administered 2011-08-21 – 2011-09-03 (×25): via ORAL

## 2011-08-21 MED ORDER — LISINOPRIL 20 MG TAB
20 mg | Freq: Every evening | ORAL | Status: DC
Start: 2011-08-21 — End: 2011-08-21

## 2011-08-21 MED ORDER — SODIUM CHLORIDE 0.9 % IV PIGGY BACK
1 gram | INTRAVENOUS | Status: DC
Start: 2011-08-21 — End: 2011-08-24
  Administered 2011-08-21 – 2011-08-23 (×3): via INTRAVENOUS

## 2011-08-21 MED ORDER — FISH OIL-OMEGA-3 FATTY ACIDS 1,000 MG-340 MG CAP
340-1000 mg | Freq: Two times a day (BID) | ORAL | Status: DC
Start: 2011-08-21 — End: 2011-09-03
  Administered 2011-08-22 – 2011-09-03 (×24): via ORAL

## 2011-08-21 MED ORDER — SODIUM POLYSTYRENE SULFONATE 15 G/60 ML ORAL SUSP
15 gram/60 mL | ORAL | Status: AC
Start: 2011-08-21 — End: 2011-08-22
  Administered 2011-08-21: 22:00:00 via ORAL

## 2011-08-21 MED ORDER — ASPIRIN 325 MG TAB
325 mg | ORAL | Status: AC
Start: 2011-08-21 — End: 2011-08-22

## 2011-08-21 MED ORDER — PANTOPRAZOLE 40 MG TAB, DELAYED RELEASE
40 mg | Freq: Every day | ORAL | Status: DC
Start: 2011-08-21 — End: 2011-09-03
  Administered 2011-08-22 – 2011-09-03 (×13): via ORAL

## 2011-08-21 MED ORDER — INSULIN REGULAR HUMAN 100 UNIT/ML INJECTION
100 unit/mL | INTRAMUSCULAR | Status: DC
Start: 2011-08-21 — End: 2011-08-22
  Administered 2011-08-21 – 2011-08-22 (×7): via INTRAVENOUS

## 2011-08-21 MED ORDER — ASPIRIN 81 MG CHEWABLE TAB
81 mg | Freq: Every day | ORAL | Status: DC
Start: 2011-08-21 — End: 2011-09-03
  Administered 2011-08-22 – 2011-09-03 (×13): via ORAL

## 2011-08-21 MED FILL — ZITHROMAX 500 MG INTRAVENOUS SOLUTION: 500 mg | INTRAVENOUS | Qty: 5

## 2011-08-21 MED FILL — FERROUS SULFATE 325 MG (65 MG ELEMENTAL IRON) TAB: 325 mg (65 mg iron) | ORAL | Qty: 1

## 2011-08-21 MED FILL — HEPARIN (PORCINE) 5,000 UNIT/ML IJ SOLN: 5000 unit/mL | INTRAMUSCULAR | Qty: 1

## 2011-08-21 MED FILL — LEVOTHYROXINE 150 MCG TAB: 150 mcg | ORAL | Qty: 1

## 2011-08-21 MED FILL — ACETAMINOPHEN 325 MG TABLET: 325 mg | ORAL | Qty: 2

## 2011-08-21 MED FILL — INSULIN REGULAR HUMAN 100 UNIT/ML INJECTION: 100 unit/mL | INTRAMUSCULAR | Qty: 1

## 2011-08-21 MED FILL — CEFTRIAXONE 1 GRAM SOLUTION FOR INJECTION: 1 gram | INTRAMUSCULAR | Qty: 1

## 2011-08-21 MED FILL — FISH OIL 340 MG-1,000 MG CAPSULE: 340-1000 mg | ORAL | Qty: 1

## 2011-08-21 MED FILL — SPS (WITH SORBITOL) 15 GRAM-20 GRAM/60 ML ORAL SUSPENSION: 15-20 gram/60 mL | ORAL | Qty: 60

## 2011-08-21 MED FILL — MISOPROSTOL 200 MCG TAB: 200 mcg | ORAL | Qty: 1

## 2011-08-21 MED FILL — SODIUM CHLORIDE 0.9 % IV: INTRAVENOUS | Qty: 1000

## 2011-08-21 MED FILL — TERAZOSIN 5 MG CAP: 5 mg | ORAL | Qty: 2

## 2011-08-21 MED FILL — INSULIN REGULAR HUMAN 100 UNIT/ML INJECTION: 100 unit/mL | INTRAMUSCULAR | Qty: 15

## 2011-08-21 MED FILL — ISOSORBIDE MONONITRATE SR 30 MG 24 HR TAB: 30 mg | ORAL | Qty: 1

## 2011-08-21 MED FILL — INSULIN REGULAR HUMAN 100 UNIT/ML INJECTION: 100 unit/mL | INTRAMUSCULAR | Qty: 10

## 2011-08-21 MED FILL — ASPIRIN 325 MG TAB: 325 mg | ORAL | Qty: 1

## 2011-08-21 NOTE — ED Notes (Signed)
Pt daughter Kirtan Sada can be reached at [phone].

## 2011-08-21 NOTE — ED Provider Notes (Signed)
Patient is a 71 y.o. male presenting with chest pain. The history is provided by the patient.   Chest Pain   This is a chronic (recent admit for NSTEMI, CKD, ASPVD with infection and uncontrolled DM, recent wound care surgery to foot, per patient had episode of crampy abd pain with no associated sx.  Now pain free.  Patient is refusing any care.  Per daughter taking SL nitro. Tired) problem. The current episode started more than 1 week ago. The problem has been gradually worsening. The problem occurs daily. The pain is associated with normal activity. Pain location: epigastrium. The patient is experiencing no pain (painfree now). Quality: crampy an dburning. The pain does not radiate. Associated symptoms include lower extremity edema (wound under care), malaise/fatigue, nausea and weakness. Pertinent negatives include no abdominal pain, no back pain, no claudication, no cough, no diaphoresis, no dizziness, no exertional chest pressure, no fever, no headaches, no irregular heartbeat, no leg pain, no near-syncope, no numbness, no orthopnea, no palpitations, no PND, no shortness of breath and no vomiting. He has tried nitroglycerin for the symptoms. The treatment provided significant relief. Risk factors include cardiac disease, dyslipidemia, diabetes mellitus, obesity, hypertension, male gender and a sendentary lifestyle. His past medical history is significant for DM and HTN.His past medical history does not include aneurysm, cancer, DVT or PE. Procedural history includes cardiac catheterization, stress thallium, exercise treadmill test and cardiac stents.       Past Medical History   Diagnosis Date   ??? CAD (coronary artery disease)      see problem list   ??? HTN (hypertension)    ??? PVD (peripheral vascular disease)    ??? DM (diabetes mellitus)    ??? Hyperlipidemia    ??? Sleep apnea      on CPAP   ??? COPD (chronic obstructive pulmonary disease)      mild COPD, recurrent pneumonia, chronic bronchitis (followed by Dr. Leroy Libman)    ??? CKD (chronic kidney disease)      Cr 1.7 in 10/09   ??? CHF (congestive heart failure)    ??? Anemia    ??? Pulmonary hypertension      PASP 60 on echo 06/22/09   ??? Thromboembolus      leg    ??? Thyroid disease    ??? Pneumonia    ??? GERD (gastroesophageal reflux disease)    ??? Chronic pain      back pain   ??? Cataract    ??? GI bleed      multiple bleeding episodes and blood transfusions.  Dr. Monika Salk has followed..  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   ??? Foot ulcer 07/18/2011        Past Surgical History   Procedure Date   ??? Hx orthopaedic      knee replacement   ??? Pr vascular surgery procedure unlist      arterial bypass   ??? Hx pacemaker    ??? Pr cardiac surg procedure unlist      cardiac stent   ??? Hx cataract removal      removed rt eye         Family History   Problem Relation Age of Onset   ??? Hypertension     ??? Cancer Mother      cervical and colon   ??? Cancer Father      lung        History     Social History   ??? Marital Status: SINGLE  Spouse Name: N/A     Number of Children: N/A   ??? Years of Education: N/A     Occupational History   ??? Not on file.     Social History Main Topics   ??? Smoking status: Former Smoker -- 20 years     Types: Cigarettes     Quit date: 11/09/1978   ??? Smokeless tobacco: Never Used   ??? Alcohol Use: No   ??? Drug Use: No   ??? Sexually Active: Not on file     Other Topics Concern   ??? Not on file     Social History Narrative   ??? No narrative on file                  ALLERGIES: Review of patient's allergies indicates no known allergies.      Review of Systems   Constitutional: Positive for malaise/fatigue, appetite change and fatigue. Negative for fever, chills, diaphoresis and activity change.   HENT: Negative for congestion, rhinorrhea, sneezing and postnasal drip.    Respiratory: Negative for cough, choking, chest tightness and shortness of breath.    Cardiovascular: Positive for chest pain. Negative for palpitations, orthopnea, claudication, leg swelling, PND and near-syncope.   Gastrointestinal: Positive for  nausea. Negative for vomiting and abdominal pain.   Genitourinary: Negative for dysuria, urgency, frequency, flank pain and difficulty urinating.   Musculoskeletal: Positive for gait problem. Negative for myalgias, back pain and joint swelling.   Skin: Negative.    Neurological: Positive for weakness and light-headedness. Negative for dizziness, speech difficulty, numbness and headaches.   Hematological: Negative.        Filed Vitals:    08/21/11 1531   BP: 149/64   Temp: 99.8 ??F (37.7 ??C)   Height: 5\' 6"  (1.676 m)   Weight: 82.555 kg (182 lb)   SpO2: 95%            Physical Exam   Nursing note and vitals reviewed.  Constitutional: He is oriented to person, place, and time. He appears well-developed and well-nourished. No distress.   HENT:   Head: Normocephalic and atraumatic.   Right Ear: External ear normal.   Left Ear: External ear normal.   Mouth/Throat: Oropharynx is clear and moist. No oropharyngeal exudate.   Eyes: Conjunctivae and EOM are normal. Pupils are equal, round, and reactive to light. Right eye exhibits no discharge. Left eye exhibits no discharge. No scleral icterus.   Neck: Normal range of motion. Neck supple. No tracheal deviation present. No thyromegaly present.   Cardiovascular: Normal rate, regular rhythm and intact distal pulses.  Exam reveals no gallop and no friction rub.    Murmur heard.  Pulmonary/Chest: Effort normal and breath sounds normal. No respiratory distress. He has no wheezes. He has no rales.   Abdominal: Soft. Bowel sounds are normal. He exhibits no distension. There is no tenderness. There is no rebound and no guarding.   Musculoskeletal: Normal range of motion. He exhibits tenderness (dressing in place, changed today per patient by wound care, no concerns). He exhibits no edema.   Lymphadenopathy:     He has no cervical adenopathy.   Neurological: He is alert and oriented to person, place, and time. No cranial nerve deficit. Coordination normal.   Skin: Skin is warm. No rash  noted. He is not diaphoretic. No erythema.   Psychiatric: He has a normal mood and affect. His behavior is normal. Judgment and thought content normal.  MDM    Procedures

## 2011-08-21 NOTE — ED Notes (Signed)
Blood glucose read high will add BMP

## 2011-08-21 NOTE — Progress Notes (Signed)
Patient had an outpatient procedure 3//19/13 with removal of tip of toes on left foot.  Patient due to see Dr Welton Flakes on 09/12/11.  Phoned to check on patient and left contact information.

## 2011-08-21 NOTE — ED Notes (Signed)
Dr Maren Reamer and pt daughter at bedside.

## 2011-08-21 NOTE — ED Notes (Signed)
Brought patient to room 3 for immediate EKG. Patient refusing to change into the gown, refusing to get on the stretcher, refusing EKG.  Went to waiting room to tell daughter that he is refusing and she is not present.  She had stated in the triage room that he would be saying there was nothing wrong with him and that she knows he has the right to refuse treatment but she knows there is something wrong and she wants him to be treated.

## 2011-08-21 NOTE — ED Notes (Signed)
Pt has a abrasion to right forehead from stated fall.  approx 1 inch in circumference

## 2011-08-21 NOTE — Progress Notes (Signed)
Pt received from emergency room on an insulin drip. Assessed and oriented to room. 0000 resting left great toe dressed w-d since left open from md office visit pt states supposed to be dressed. 0200 watching tv pt pleasant calm and cooperative. 0400 reassessed insulin gtt on hold.

## 2011-08-21 NOTE — ED Notes (Signed)
Pt spoke with Dr. Maren Reamer and agreed to have EKG and blood tests done.  Pt asked if if agreed to allow me to place on monitor.  Pt gave his consent.  Pt asked if agreed for [name] to do EKG, pt again gave his consent.  Pt advised that if did not want something done when asked to decline if he wanted.  Pt agreed to let us know.

## 2011-08-21 NOTE — ED Notes (Signed)
Family now visualized in the waiting room by this triage nurse.  Asked them to come back to the room.  Explained that the patient is refusing any treatment.  Left them to talk to the patient at this time in room 3.  RN aware family is now at bedside

## 2011-08-21 NOTE — ED Notes (Signed)
patient took nitro today thought he had chest pain, stating now he's not sure why he took it.  Elevated blood sugar of 450 at home.

## 2011-08-21 NOTE — Consults (Signed)
Please see admission history and physical.

## 2011-08-21 NOTE — ED Notes (Addendum)
Dr Mylo Red made aware BS via glucometer read "high," glucose level ordered via lab.  MD requesting higher level of care bed assignment (stepdown).

## 2011-08-21 NOTE — ED Notes (Signed)
Pt stated that he agreed to be admimted

## 2011-08-21 NOTE — ED Notes (Signed)
Cardiology at bedside

## 2011-08-21 NOTE — H&P (Addendum)
[facility]  [address] Travis [name] [place], [place]  [address]  [phone]    Admission History and Physical      NAME:  Travis [name]   DOB:   1941/01/08   MRN:  [id number]     PCP:  Sol Blazing, MD     Date/Time:  08/21/2011         Subjective:     CHIEF COMPLAINT: chest pain, epigastric pain and cough     HISTORY OF PRESENT ILLNESS:     Travis [name] is a 71 y.o.  African American male who is admitted with chest pain/ pneumonia.  Travis [name] with multiple medical problems including CAD, DM, HTN, anemia, PAD presented to ER c/o chest pain, epigastric abdominal pain and cough. The chest pain and abdominal pain started today, while he was sitting. Right away, he took nitroglycerin and resolved. Denies SOB, nausea or vomiting. The pain is similar to the pain he had during last admission. Last admission, he has cardiac cath and his CAD is not amenable for PCI. Pt encouraged to have a viability study prior to attempts at revascularization, but he declined MRI. PET scan to be arranged as an outpatient.   Pt is also c/o cough, which is not productive. Denies fever.       Past Medical History   Diagnosis Date   ??? CAD (coronary artery disease)      see problem list   ??? HTN (hypertension)    ??? PVD (peripheral vascular disease)    ??? DM (diabetes mellitus)    ??? Hyperlipidemia    ??? Sleep apnea      on CPAP   ??? COPD (chronic obstructive pulmonary disease)      mild COPD, recurrent pneumonia, chronic bronchitis (followed by Dr. Leroy Libman)   ??? CKD (chronic kidney disease)      Cr 1.7 in 10/09   ??? CHF (congestive heart failure)    ??? Anemia    ??? Pulmonary hypertension      PASP 60 on echo 06/22/09   ??? Thromboembolus      leg    ??? Thyroid disease    ??? Pneumonia    ??? GERD (gastroesophageal reflux disease)    ??? Chronic pain      back pain   ??? Cataract    ??? GI bleed      multiple bleeding episodes and blood transfusions.  Dr. Monika Salk has followed..  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   ??? Foot ulcer 07/18/2011        Past Surgical  History   Procedure Date   ??? Hx orthopaedic      knee replacement   ??? Pr vascular surgery procedure unlist      arterial bypass   ??? Hx pacemaker    ??? Pr cardiac surg procedure unlist      cardiac stent   ??? Hx cataract removal      removed rt eye       History   Substance Use Topics   ??? Smoking status: Former Smoker -- 20 years     Types: Cigarettes     Quit date: 11/09/1978   ??? Smokeless tobacco: Never Used   ??? Alcohol Use: No        Family History   Problem Relation Age of Onset   ??? Hypertension     ??? Cancer Mother      cervical and colon   ??? Cancer Father  lung        No Known Allergies     Prior to Admission medications    Medication Sig Start Date End Date Taking? Authorizing Provider   albuterol (PROVENTIL HFA, VENTOLIN HFA) 90 mcg/actuation inhaler Take 2 Puffs by inhalation every six (6) hours as needed for Wheezing. 03/11/11  Yes Laddie Aquas, MD   oxyCODONE-acetaminophen (PERCOCET 7.5) 7.5-325 mg per tablet Take 1 Tab by mouth every four (4) hours as needed for Pain. 08/13/11   Lenn Sink, MD   fish oil-omega-3 fatty acids 340-1,000 mg capsule Take 1 Cap by mouth two (2) times a day. 07/24/11   Gaspar Bidding, MD   insulin glargine (LANTUS) 100 unit/mL injection Please take 25 units of your glargine daily 07/24/11   Gaspar Bidding, MD   metoprolol (LOPRESSOR) 100 mg tablet Take 1 Tab by mouth two (2) times a day. 07/23/11   Sherin Quarry, ACNP   atorvastatin (LIPITOR) 40 mg tablet Take 1 Tab by mouth nightly. 07/23/11   Sherin Quarry, ACNP   isosorbide mononitrate ER (IMDUR) 30 mg tablet Take 1 Tab by mouth daily. 07/23/11   Sherin Quarry, ACNP   Ferrous Sulfate (SLOW FE) 47.5 mg iron TbER tablet Take 1 Tab by mouth two (2) times a day.    Historical Provider   clopidogrel (PLAVIX) 75 mg tablet Take 1 Tab by mouth daily. 06/25/11   Laddie Aquas, MD   potassium chloride (K-DUR, KLOR-CON) 10 mEq tablet Take 1 Tab by mouth daily. 02/05/11   Laddie Aquas, MD   insulin aspart (NOVOLOG) 100 unit/mL  injection by SubCUTAneous route Before breakfast and dinner.      Historical Provider   bumetanide (BUMEX) 2 mg tablet Take 2 mg by mouth daily.    Historical Provider   misoprostol (CYTOTEC) 200 mcg tablet Take 1 Tab by mouth two (2) times a day. 01/26/10   Laddie Aquas, MD   omeprazole (PRILOSEC) 20 mg capsule Take 20 mg by mouth daily.    Historical Provider   lisinopril (PRINIVIL, ZESTRIL) 20 mg tablet Take 1 Tab by mouth nightly. 11/13/09   Laddie Aquas, MD   terazosin (HYTRIN) 5 mg capsule Take 10 mg by mouth nightly.    Historical Provider   aspirin 81 mg tablet Take  by mouth daily.    Historical Provider   omega-3 fatty acids-vitamin e (FISH OIL) 1,000 mg Cap Take  by mouth two (2) times a day.    Historical Provider   levothyroxine (SYNTHROID) 150 mcg tablet Take  by mouth daily (before breakfast).    Historical Provider   nitroglycerin (NITROSTAT) 0.4 mg SL tablet by SubLINGual route every five (5) minutes as needed.    Historical Provider   ranitidine hcl (ZANTAC) 150 mg capsule Take 150 mg by mouth two (2) times a day.    Historical Provider         Review of Systems:  (bold if positive, if negative)    Gen:  Eyes:  ENT:  CVS:   chest pain,Pulm:  Cough,GI:    GU:    MS:  Skin:  Psych:  Endo:    Hem:  Renal:    Neuro:            Objective:      VITALS:    Vital signs reviewed; most recent are:    Visit Vitals   Item Reading   ??? BP 107/76   ??? Pulse 83   ???  Temp 99.8 ??F (37.7 ??C)   ??? Resp 18   ??? Ht 5\' 6"  (1.676 m)   ??? Wt 182 lb   ??? BMI 29.38 kg/m2   ??? SpO2 100%     SpO2 Readings from Last 6 Encounters:   08/21/11 100%   08/13/11 99%   08/13/11 99%   07/24/11 98%   05/25/11 97%   11/13/09 98%    O2 Flow Rate (L/min): 2 l/min   No intake or output data in the 24 hours ending 08/21/11 1803         Exam:     Physical Exam:    Gen:  Well-developed, well-nourished, in no acute distress  HEENT:  Pink conjunctivae, PERRL, hearing intact to voice, moist mucous membranes  Neck:  Supple, without masses, thyroid non-tender   Resp:    rales on the LT  Card:  No murmurs, normal S1, S2 without thrills, bruits or peripheral edema  Abd:  Soft, non-tender, non-distended, normoactive bowel sounds are present, no palpable organomegaly and no detectable hernias  Lymph:  No cervical or inguinal adenopathy  Musc:  No cyanosis or clubbing  Skin:  No rashes or ulcers, skin turgor is good  Neuro:  Cranial nerves are grossly intact, no focal motor weakness, follows commands appropriately  Psych:  Good insight, oriented to person, place and time, alert       Labs:    Recent Labs   Basename 08/21/11 1630    WBC 13.2*    HGB 9.4*    HCT 28.7*    PLT 179     No results found for this basename: NA:1,K:1,CL:1,CO2:1,GLU:1,BUN:1,CREA:1,CA:1,MG:1,PHOS:1,ALB:1,TBIL:1,SGOT:1,ALT:1 in the last 72 hours  Lab Results   Component Value Date/Time    POC GLUCOSE 59 08/13/2011  3:29 PM    POC GLUCOSE 72 08/13/2011  1:17 PM     No results found for this basename: PH:1,PCO2:1,PO2:1,HCO3:1,FIO2:1 in the last 72 hours  No results found for this basename: INR:1 in the last 72 hours    Telemetry reviewed:   normal sinus rhythm       Assessment/Plan:    1.   Chest pain/ CAD/ NSTEMI (POA). Last admission for NSEMI. Continue ASA and plavix. NTG PRN. Check serial cardiac enzymes. Cardiology consult.     2.  Systolic CHF, compensated. His last EF was 40 %. Hold lasix as pt is volume depleted with worsening renal function. Hold ACEI due to worsening renal function and hyperkalemia.     3.  Acute on chronic renal failure. This is most likely secondary to volume depletion due to diuretics. Hold lasix for now and gentle hydration. Check urine lytes. Check BMP in AM    4.  Hyperkalemia. IVF and Kayexalate. Labs in AM.    6.  Hyponatremia. secondary to diuretics. Continue NS IVF    7.  Pneumonia. Check sputum for gram stain and culture. Start CTx and zithromax.    8.  DM, not controlled. BS remained high. Started on insulin drip.  Check A1C    9.  Anemia. Monitor.    10.   Hyperlipidemia. Continue statin    11.  Hypothyroidism. On synthroid    12.  PAD. Recently has toes amputated at [facility]. Stiches may need to be removed.      Principal Problem:   *Chest pain, unspecified (08/21/2011)  Active Problems:     PAD (peripheral artery disease) (05/18/2011)   PVR tracing 05/18/2011 suggest bilateral SFA occlusion and calf vessel  disease    and high grade stenosis of his proximal fem-pop   Angio March 2012 [facility]): r sfa-ak pop with prox stenosis, 1 vs r/o; left    diffuse sfa , severe tpt disease     Acute on chronic renal failure (08/21/2011)     Hyperkalemia (08/21/2011)     Hyponatremia (08/21/2011)     Pneumonia, organism unspecified (08/21/2011)     Leukocytosis, unspecified (08/21/2011)     DM (diabetes mellitus) (08/21/2011)     Anemia of other chronic disease (08/21/2011)     Other and unspecified hyperlipidemia (08/21/2011)         Risk of deterioration: high      Total time spent with patient: 8 Minutes                  Care Plan discussed with: Patient, Family, Nursing Staff and >50% of time spent in counseling and coordination of care    Discussed:  Care Plan    Prophylaxis:  Hep SQ    Probable Disposition:  Home w/Family           ___________________________________________________    Attending Physician: Cleotis Nipper, MD

## 2011-08-21 NOTE — ED Notes (Signed)
oxgyen applied 2L NC

## 2011-08-21 NOTE — ED Notes (Signed)
Pt brought back from triage via w/c.  Refusing to disrobe, or have ekg done.  Pt reports "youre just looking for a reason to keep me."  Dr Maren Reamer made aware.  Pt claims that his daughter brought him to the er "to get rid of me."

## 2011-08-21 NOTE — ED Notes (Signed)
Report given to carrie, rn using SBAR format.

## 2011-08-21 NOTE — ED Notes (Signed)
Dr. Maren Reamer at bedside to advise pt that he needed to be admitted.  Pt not wanting to stay.

## 2011-08-21 NOTE — Consults (Signed)
Cardiology Consultation Note    Loraine Leriche A. Margot Oriordan, MD, [facility]    [address], [place], [place] [address]  Phone [phone]; Fax [phone]    08/21/2011  3:32 PM  Phys Other, MD  DOB:  05/12/1941   MRN:  [id number]         Subjective:   No admission diagnoses for hospital encounter.       Assessment/Plan:         1. Chest discomfort in setting of severe CAD with troponin of 0.5   ?? Disease not amenable to catheter based therapy  ?? quick return not to be unexpected   ?? Will try to maximize antianginals  ?? Elevated troponin in the setting of increased cretine not alarming  ?? Doubtful there would be any chance to intervene on anything based on cath report  ?? May need attempt at opening chronic occlusion; possibly laser  ?? Consider Ranexa or coumadin    2. Dyslipidemia    3. HTN    4. PVD    5. Diabetes    6. Renal insufficiency             Travis [name] is a 71 y.o. male I am seeing for diabetes poorly controlled, hypertension, hyperlipidemia, coronary artery disease and OSA and peripheral artery disease. Symptoms include: chest pressure/discomfort  Cardiac risk factors: family history, dyslipidemia, diabetes mellitus, obesity, sedentary life style, male gender, hypertension. This gentleman who is a patient of Dr. Rayann Heman is returning to the hospital after having chest pain and increase use of NTG. He has severe CAD based on recent cardiac catheterization.    He was recnetly discharged and was scheduled to see Dr. Welton Flakes as outpatient. He was last admitted with non-ST elevated myocardial infarction . Cath was done, but CAD was not amenable to PCI. Medical management was recommended at that time.  Of note his son recently has been diagnosed with AML and is under treatment. The patient is under stress regarding this and may have less interest in taking care of himself.      No Known Allergies      Past Medical History   Diagnosis Date   ??? CAD (coronary artery disease)      see problem list    ??? HTN (hypertension)    ??? PVD (peripheral vascular disease)    ??? DM (diabetes mellitus)    ??? Hyperlipidemia    ??? Sleep apnea      on CPAP   ??? COPD (chronic obstructive pulmonary disease)      mild COPD, recurrent pneumonia, chronic bronchitis (followed by Dr. Leroy Libman)   ??? CKD (chronic kidney disease)      Cr 1.7 in 10/09   ??? CHF (congestive heart failure)    ??? Anemia    ??? Pulmonary hypertension      PASP 60 on echo 06/22/09   ??? Thromboembolus      leg    ??? Thyroid disease    ??? Pneumonia    ??? GERD (gastroesophageal reflux disease)    ??? Chronic pain      back pain   ??? Cataract    ??? GI bleed      multiple bleeding episodes and blood transfusions.  Dr. Monika Salk has followed..  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   ??? Foot ulcer 07/18/2011        Past Surgical History   Procedure Date   ??? Hx orthopaedic      knee replacement   ???  Pr vascular surgery procedure unlist      arterial bypass   ??? Hx pacemaker    ??? Pr cardiac surg procedure unlist      cardiac stent   ??? Hx cataract removal      removed rt eye        .Home Medications:  Prior to Admission Medications22   Medication Last Dose Informant Patient Reported? Taking?   albuterol (PROVENTIL HFA, VENTOLIN HFA) 90 mcg/actuation inhaler 08/21/2011 at Unknown  No Yes   Take 2 Puffs by inhalation every six (6) hours as needed for Wheezing.   oxyCODONE-acetaminophen (PERCOCET 7.5) 7.5-325 mg per tablet   No No   Take 1 Tab by mouth every four (4) hours as needed for Pain.   fish oil-omega-3 fatty acids 340-1,000 mg capsule   No No   Take 1 Cap by mouth two (2) times a day.   insulin glargine (LANTUS) 100 unit/mL injection   No No   Please take 25 units of your glargine daily   metoprolol (LOPRESSOR) 100 mg tablet   No No   Take 1 Tab by mouth two (2) times a day.   atorvastatin (LIPITOR) 40 mg tablet   No No   Take 1 Tab by mouth nightly.   isosorbide mononitrate ER (IMDUR) 30 mg tablet   No No   Take 1 Tab by mouth daily.   Ferrous Sulfate (SLOW FE) 47.5 mg iron TbER tablet   Yes No   Take 1  Tab by mouth two (2) times a day.   clopidogrel (PLAVIX) 75 mg tablet   No No   Take 1 Tab by mouth daily.   potassium chloride (K-DUR, KLOR-CON) 10 mEq tablet   No No   Take 1 Tab by mouth daily.   insulin aspart (NOVOLOG) 100 unit/mL injection   Yes No   by SubCUTAneous route Before breakfast and dinner.     bumetanide (BUMEX) 2 mg tablet   Yes No   Take 2 mg by mouth daily.   misoprostol (CYTOTEC) 200 mcg tablet   No No   Take 1 Tab by mouth two (2) times a day.   omeprazole (PRILOSEC) 20 mg capsule   Yes No   Take 20 mg by mouth daily.   lisinopril (PRINIVIL, ZESTRIL) 20 mg tablet   No No   Take 1 Tab by mouth nightly.   terazosin (HYTRIN) 5 mg capsule   Yes No   Take 10 mg by mouth nightly.   aspirin 81 mg tablet   Yes No   Take  by mouth daily.   omega-3 fatty acids-vitamin e (FISH OIL) 1,000 mg Cap   Yes No   Take  by mouth two (2) times a day.   levothyroxine (SYNTHROID) 150 mcg tablet   Yes No   Take  by mouth daily (before breakfast).   nitroglycerin (NITROSTAT) 0.4 mg SL tablet   Yes No   by SubLINGual route every five (5) minutes as needed.   ranitidine hcl (ZANTAC) 150 mg capsule   Yes No   Take 150 mg by mouth two (2) times a day.          Hospital Medications:  Current Facility-Administered Medications   Medication Dose Route Frequency   ??? aspirin (ASPIRIN) tablet 325 mg  325 mg Oral NOW   ??? sodium chloride 0.9 % bolus infusion 1,000 mL  1,000 mL IntraVENous ONCE   ??? insulin regular (NOVOLIN, HUMULIN) injection 10 Units  10 Units SubCUTAneous NOW   ??? sodium polystyrene (KAYEXALATE) 15 gram/60 mL oral suspension 15 g  15 g Oral NOW   ??? levofloxacin (LEVAQUIN) 750 mg in D5W IVPB  750 mg IntraVENous NOW            Objective:   Laboratory and Imaging have been reviewed and are notable for          Recent Labs   [facility] 08/21/11 1630    NA --    K --    CO2 --    BUN --    CREA --    GLU --    PHOS --    MG --    WBC 13.2*    HGB 9.4*    HCT 28.7*    PLT 179      Lab Results   Component Value Date/Time     INR 1.1 05/20/2011  8:20 AM    INR 1.2 05/17/2011  2:22 AM    INR 1.2 10/17/2009  4:18 AM    Prothrombin time 11.7 05/20/2011  8:20 AM    Prothrombin time 13.0 05/17/2011  2:22 AM    Prothrombin time 11.9 10/17/2009  4:18 AM       Lab Results   Component Value Date/Time    BNP 102 11/02/2007  4:10 AM        Cardiac work up to date:            PROCEDURES                                           DATE     1.  Echocardiogram                  (07/19/11)  SUMMARY:  Left ventricle: There was moderate hypokinesis of the basal-mid  inferoseptal and basal-mid inferior wall(s). Features were consistent with  a pseudonormal left ventricular filling pattern, with concomitant abnormal  relaxation and increased filling pressure (grade 2 diastolic dysfunction).    Left atrium: The atrium was mildly dilated.    Tricuspid valve: There was mild regurgitation.    Inferior vena cava, hepatic veins: The respirophasic change in diameter  was more than 50%.            2.  Cardiac catheterization                (07/22/11)  SUMMARY:    -- CARDIAC STRUCTURES:  -- Moderate left ventricular systolic dysfunction (EF 40%) with basal-mid  inferior akinesis.  -- No significant aortic valve gradient.  -- The mitral valve exhibited 1+ regurgitation.    -- CORONARY CIRCULATION:  -- Distal left main: There was a 30 % stenosis.  -- Mid LAD: There was a diffuse 30 % stenosis.  -- Distal LAD: 70% stenosis of apical LAD.  -- Mid circumflex: There was a 100 % stenosis at the site of a prior  stent. This lesion is a chronic total occlusion. -- 1st obtuse marginal:  There was a discrete 80 % stenosis at the ostium of the vessel segment.  -- Proximal RCA: There was a diffuse 80 % stenosis.  -- Mid RCA: There was a 100 % stenosis at the site of a prior stent. This  lesion is a chronic total occlusion.    3. EP  Hx pacemaker  4. Cholesterol profile                         Lab Results   Component Value Date/Time    Cholesterol, total 123 05/17/2011  2:22 AM     HDL Cholesterol 55 05/17/2011  2:22 AM    LDL, calculated 58.4 05/17/2011  2:22 AM    VLDL, calculated 9.6 05/17/2011  2:22 AM    Triglyceride 48 05/17/2011  2:22 AM    CHOL/HDL Ratio 2.2 05/17/2011  9:60 AM         5. Carotid duplex/Vascular studies:             Pr vascular surgery procedure unlist     arterial bypass          Social History:  History   Substance Use Topics   ??? Smoking status: Former Smoker -- 20 years     Types: Cigarettes     Quit date: 11/09/1978   ??? Smokeless tobacco: Never Used   ??? Alcohol Use: No       Family History:  Family History   Problem Relation Age of Onset   ??? Hypertension     ??? Cancer Mother      cervical and colon   ??? Cancer Father      lung       Review of Symptoms:  A comprehensive review of systems was negative except for that written in the HPI.    Physical Exam:      BP 107/76   Pulse 83   Temp 99.8 ??F (37.7 ??C)   Resp 18   Ht 5\' 6"  (1.676 m)   Wt 182 lb (82.555 kg)   BMI 29.38 kg/m2   SpO2 100%  General Appearance:  Well developed, well nourished,alert and oriented x 3, and individual in no acute distress.   Ears/Nose/Mouth/Throat:   Hearing grossly normal.     Neck:    Chest:   Lungs clear to auscultation bilaterally.anteriorly   Cardiovascular:  Regular rate and rhythm, S1, S2 normal, 2/6 systolic murmur.   Abdomen:   Soft, non-tender, bowel sounds are active.Protuberant and soft   Extremities: + edema bilaterally. Toe amputations   Skin: Warm and dry.               I have discussed the diagnosis with the patient and the intended plan as seen in the above orders.  Questions were answered concerning future plans.  I have discussed medication side effects and warnings with the patient as well.    Travis [name] is in agreement to the plan listed above and wishes to proceed.     he  was instructed not to smoke, eat heart healthy diet  and to exercise.     Thank you for this consult.      Rushie Chestnut, MD

## 2011-08-21 NOTE — ED Notes (Signed)
Sharyne Peach, social service, advised that patient refusing care, tx and that when triage nurse went to get daughter that she was not in the waiting room. Also advised that the patient stated that the daughter just wanted to get rid of him.  Stanton Kidney attempted phone number given for daughter and phone number is not working.

## 2011-08-21 NOTE — Progress Notes (Signed)
08/21/11   CARE MANAGEMENT NOTE:  Initially, pt arrived at the ER and declined treatment - he later agreed to be examined.  Per chart, pt was hospitalized at [facility]. [name] from 05/16/11 - 05/25/11 with NSTEMI.  Upon d/c he was set-up with [facility] agency.  He received a rolling walker for home use thru [facility] [phone]), and home oxygen thru [facility].  Pt will be admitted to the hospital with cc: chest pain and further medical management.  Full assessment not completed at this time.  CM will continue to follow pt's hospital course and will assist as needed.  D.Wegner

## 2011-08-21 NOTE — ED Notes (Signed)
Pt advised of NPO status. Verbalized understanding and compliance.

## 2011-08-21 NOTE — ED Notes (Signed)
Strong odor of acetone noted

## 2011-08-21 NOTE — ED Notes (Signed)
Transported to inpatient bed assignment via stretcher, accompanied by RN, cardiac monitor rsr in the 60's, no ectopy. 2 liters nc- o2 sat 98%.  Insulin drip continues to infuse at prescribed rate

## 2011-08-21 NOTE — ED Notes (Signed)
ECG:  Rate 86, sinus rhythm, LAE, RAD, NSIVCD, lateral t wave inversion, c/w ischemia, abn ecg, no sign change from prior

## 2011-08-22 LAB — CREATININE, UR, RANDOM
Creatinine, urine random: 136.42 mg/dL
Creatinine, urine random: 177.2 mg/dL

## 2011-08-22 LAB — EKG, 12 LEAD, INITIAL
Atrial Rate: 86 {beats}/min
Calculated P Axis: 47 degrees
Calculated R Axis: 92 degrees
Calculated T Axis: 156 degrees
Diagnosis: NORMAL
P-R Interval: 206 ms
Q-T Interval: 432 ms
QRS Duration: 122 ms
QTC Calculation (Bezet): 516 ms
Ventricular Rate: 86 {beats}/min

## 2011-08-22 LAB — GLUCOSE, POC
Glucose (POC): 555 mg/dL — ABNORMAL HIGH (ref 75–110)
Glucose (POC): 409 mg/dL — ABNORMAL HIGH (ref 75–110)
Glucose (POC): 303 mg/dL — ABNORMAL HIGH (ref 75–110)
Glucose (POC): 202 mg/dL — ABNORMAL HIGH (ref 75–110)
Glucose (POC): 127 mg/dL — ABNORMAL HIGH (ref 75–110)
Glucose (POC): 103 mg/dL (ref 75–110)
Glucose (POC): 74 mg/dL — ABNORMAL LOW (ref 75–110)
Glucose (POC): 59 mg/dL — ABNORMAL LOW (ref 75–110)
Glucose (POC): 94 mg/dL (ref 75–110)
Glucose (POC): 60 mg/dL — ABNORMAL LOW (ref 75–110)
Glucose (POC): 86 mg/dL (ref 75–110)
Glucose (POC): 108 mg/dL (ref 75–110)
Glucose (POC): 135 mg/dL — ABNORMAL HIGH (ref 75–110)
Glucose (POC): 146 mg/dL — ABNORMAL HIGH (ref 75–110)
Glucose (POC): 189 mg/dL — ABNORMAL HIGH (ref 75–110)
Glucose (POC): 294 mg/dL — ABNORMAL HIGH (ref 75–110)
Glucose (POC): 422 mg/dL — ABNORMAL HIGH (ref 75–110)

## 2011-08-22 LAB — CBC WITH AUTOMATED DIFF
ABS. BASOPHILS: 0 10*3/uL (ref 0.0–0.1)
ABS. EOSINOPHILS: 0 10*3/uL (ref 0.0–0.4)
ABS. LYMPHOCYTES: 1.3 10*3/uL (ref 0.8–3.5)
ABS. MONOCYTES: 1.1 10*3/uL — ABNORMAL HIGH (ref 0.0–1.0)
ABS. NEUTROPHILS: 8.9 10*3/uL — ABNORMAL HIGH (ref 1.8–8.0)
BASOPHILS: 0 % (ref 0–1)
EOSINOPHILS: 0 % (ref 0–7)
HCT: 23.3 % — ABNORMAL LOW (ref 36.6–50.3)
HGB: 7.8 g/dL — ABNORMAL LOW (ref 12.1–17.0)
LYMPHOCYTES: 11 % — ABNORMAL LOW (ref 12–49)
MCH: 28.9 PG (ref 26.0–34.0)
MCHC: 33.5 g/dL (ref 30.0–36.5)
MCV: 86.3 FL (ref 80.0–99.0)
MONOCYTES: 10 % (ref 5–13)
NEUTROPHILS: 79 % — ABNORMAL HIGH (ref 32–75)
PLATELET: 189 10*3/uL (ref 150–400)
RBC: 2.7 M/uL — ABNORMAL LOW (ref 4.10–5.70)
RDW: 17.2 % — ABNORMAL HIGH (ref 11.5–14.5)
WBC: 11.3 10*3/uL — ABNORMAL HIGH (ref 4.1–11.1)

## 2011-08-22 LAB — METABOLIC PANEL, BASIC
Anion gap: 6 mmol/L (ref 5–15)
BUN/Creatinine ratio: 16 (ref 12–20)
BUN: 42 MG/DL — ABNORMAL HIGH (ref 6–20)
CO2: 28 MMOL/L (ref 21–32)
Calcium: 7.6 MG/DL — ABNORMAL LOW (ref 8.5–10.1)
Chloride: 102 MMOL/L (ref 97–108)
Creatinine: 2.63 MG/DL — ABNORMAL HIGH (ref 0.45–1.15)
GFR est AA: 29 mL/min/{1.73_m2} — ABNORMAL LOW (ref >60)
GFR est non-AA: 24 mL/min/{1.73_m2} — ABNORMAL LOW (ref >60)
Glucose: 137 MG/DL — ABNORMAL HIGH (ref 65–100)
Potassium: 4.7 MMOL/L (ref 3.5–5.1)
Sodium: 136 MMOL/L (ref 136–145)

## 2011-08-22 LAB — CK W/ CKMB & INDEX
CK - MB: 8.9 NG/ML — ABNORMAL HIGH (ref 0.5–3.6)
CK - MB: 9.8 NG/ML — ABNORMAL HIGH (ref 0.5–3.6)
CK-MB Index: 3 — ABNORMAL HIGH (ref 0–2.5)
CK-MB Index: 3.1 — ABNORMAL HIGH (ref 0–2.5)
CK: 288 U/L (ref 39–308)
CK: 331 U/L — ABNORMAL HIGH (ref 39–308)

## 2011-08-22 LAB — SODIUM, UR, RANDOM
Sodium,urine random: 14 MMOL/L
Sodium,urine random: 23 MMOL/L

## 2011-08-22 LAB — HEMOGLOBIN A1C WITH EAG
Est. average glucose: 200 mg/dL
Hemoglobin A1c: 8.6 % — ABNORMAL HIGH (ref 4.2–6.3)

## 2011-08-22 LAB — TROPONIN I
Troponin-I, Qt.: 1.63 ng/mL — ABNORMAL HIGH (ref <0.05)
Troponin-I, Qt.: 2.21 ng/mL — ABNORMAL HIGH (ref <0.05)

## 2011-08-22 LAB — MAGNESIUM: Magnesium: 1.9 MG/DL (ref 1.6–2.4)

## 2011-08-22 MED ORDER — RANOLAZINE SR 500 MG 12 HR TAB
500 mg | Freq: Every day | ORAL | Status: DC
Start: 2011-08-22 — End: 2011-08-24
  Administered 2011-08-22 – 2011-08-24 (×3): via ORAL

## 2011-08-22 MED ORDER — INSULIN GLARGINE 100 UNIT/ML INJECTION
100 unit/mL | Freq: Every day | SUBCUTANEOUS | Status: DC
Start: 2011-08-22 — End: 2011-08-23
  Administered 2011-08-22: 17:00:00 via SUBCUTANEOUS

## 2011-08-22 MED ORDER — FAMOTIDINE 20 MG TAB
20 mg | Freq: Every day | ORAL | Status: DC
Start: 2011-08-22 — End: 2011-09-03
  Administered 2011-08-23 – 2011-09-03 (×12): via ORAL

## 2011-08-22 MED ORDER — DEXTROSE 50% IN WATER (D50W) IV SYRG
INTRAVENOUS | Status: DC | PRN
Start: 2011-08-22 — End: 2011-09-03

## 2011-08-22 MED ORDER — INSULIN LISPRO 100 UNIT/ML INJECTION
100 unit/mL | Freq: Four times a day (QID) | SUBCUTANEOUS | Status: DC
Start: 2011-08-22 — End: 2011-08-26
  Administered 2011-08-22 – 2011-08-26 (×11): via SUBCUTANEOUS

## 2011-08-22 MED ORDER — INSULIN LISPRO 100 UNIT/ML INJECTION
100 unit/mL | Freq: Once | SUBCUTANEOUS | Status: AC
Start: 2011-08-22 — End: 2011-08-22
  Administered 2011-08-22: 22:00:00 via SUBCUTANEOUS

## 2011-08-22 MED ORDER — OTHER(NON-FORMULARY)
Freq: Every day | Status: DC
Start: 2011-08-22 — End: 2011-08-22

## 2011-08-22 MED FILL — LEVOTHYROXINE 150 MCG TAB: 150 mcg | ORAL | Qty: 1

## 2011-08-22 MED FILL — FISH OIL 340 MG-1,000 MG CAPSULE: 340-1000 mg | ORAL | Qty: 1

## 2011-08-22 MED FILL — INSULIN LISPRO 100 UNIT/ML INJECTION: 100 unit/mL | SUBCUTANEOUS | Qty: 1

## 2011-08-22 MED FILL — MISOPROSTOL 200 MCG TAB: 200 mcg | ORAL | Qty: 1

## 2011-08-22 MED FILL — PLAVIX 75 MG TABLET: 75 mg | ORAL | Qty: 1

## 2011-08-22 MED FILL — RANEXA 500 MG TABLET,EXTENDED RELEASE: 500 mg | ORAL | Qty: 1

## 2011-08-22 MED FILL — OXYCODONE-ACETAMINOPHEN 7.5 MG-325 MG TAB: ORAL | Qty: 1

## 2011-08-22 MED FILL — HEPARIN (PORCINE) 5,000 UNIT/ML IJ SOLN: 5000 unit/mL | INTRAMUSCULAR | Qty: 1

## 2011-08-22 MED FILL — CEFTRIAXONE 1 GRAM SOLUTION FOR INJECTION: 1 gram | INTRAMUSCULAR | Qty: 1

## 2011-08-22 MED FILL — FERROUS SULFATE 325 MG (65 MG ELEMENTAL IRON) TAB: 325 mg (65 mg iron) | ORAL | Qty: 1

## 2011-08-22 MED FILL — DEXTROSE 50% IN WATER (D50W) IV SYRG: INTRAVENOUS | Qty: 50

## 2011-08-22 MED FILL — ZITHROMAX 500 MG INTRAVENOUS SOLUTION: 500 mg | INTRAVENOUS | Qty: 5

## 2011-08-22 MED FILL — ASPIRIN 81 MG CHEWABLE TAB: 81 mg | ORAL | Qty: 1

## 2011-08-22 MED FILL — PANTOPRAZOLE 40 MG TAB, DELAYED RELEASE: 40 mg | ORAL | Qty: 1

## 2011-08-22 MED FILL — INSULIN GLARGINE 100 UNIT/ML INJECTION: 100 unit/mL | SUBCUTANEOUS | Qty: 0.25

## 2011-08-22 MED FILL — ISOSORBIDE MONONITRATE SR 30 MG 24 HR TAB: 30 mg | ORAL | Qty: 1

## 2011-08-22 MED FILL — METOPROLOL TARTRATE 50 MG TAB: 50 mg | ORAL | Qty: 2

## 2011-08-22 MED FILL — LIPITOR 20 MG TABLET: 20 mg | ORAL | Qty: 2

## 2011-08-22 NOTE — Progress Notes (Addendum)
Cardiology Progress Note         NAME:  Travis [name]   DOB:   Mar 18, 1941   MRN:   [id number]     Assessment/Plan:   1. Chest discomfort in setting of severe CAD with troponin of 2.21.  EF 40%.  Disease not amenable to catheter based therapy based on prior evaluation.   Will add ranexa daily given renal fx.  Consider palliative care consult given disease extent and currrent thoughts of no furthetr testing. . Cont plavix, asa, BB, statin.   2. Dyslipidemia: cont statin   3. HTN   4. PVD   5. Diabetes   6. Renal insufficiency:  Dr Sherrine Maples following: On IVF, off loops and ACE , Check renal duplex and PVR      CARDIOLOGY ATTENDING  Patient seen and examined.  Agree with above.  Continue with medical management for CAD.  Mr. Crooker may be agreeable to cardiac MRI (with plans for PCI of chronic total occlusion) if he can be sedated for the MRI.  Will plan for the MRI later, once he is recovers from ARF.     Tharon Aquas, MD, [facility]              Subjective:   Travis [name] is a 71 y.o. AA male  With a PMH of  diabetes poorly controlled, hypertension, hyperlipidemia, coronary artery disease and OSA and peripheral artery disease. Symptoms include: chest pressure/discomfort Cardiac risk factors: family history, dyslipidemia, diabetes mellitus, obesity, sedentary life style, male gender, hypertension. He presented to the ER after having used NTG for chest pain. Marland Kitchen He has severe CAD based on recent cardiac catheterization. He was recently discharged and was scheduled to see Dr. Welton Flakes as outpatient. He was last admitted with non-ST elevated myocardial infarction . Cath was done, but CAD was not amenable to PCI. Medical management was recommended at that time.   Of note his son recently has been diagnosed with AML and is under treatment. The patient is under stress regarding this and may have less interest in taking care of himself.        Cardiac ROS: No further chest pains.  Wants to eat. Patient  denies any exertional chest pain, dyspnea, palpitations, syncope, orthopnea, edema or paroxysmal nocturnal dyspnea.  Cardiac cath 2.25.13:  Distal left main: There was a 30 % stenosis.  -- Mid LAD: There was a diffuse 30 % stenosis.  -- Distal LAD: 70% stenosis of apical LAD.  -- Mid circumflex: There was a 100 % stenosis at the site of a prior  stent. This lesion is a chronic total occlusion. -- 1st obtuse marginal:  There was a discrete 80 % stenosis at the ostium of the vessel segment.  -- Proximal RCA: There was a diffuse 80 % stenosis.  -- Mid RCA: There was a 100 % stenosis at the site of a prior stent. This  lesion is a chronic total occlusion.          Review of Systems: No nausea, indigestion, vomiting, pain, cough, sputum. No bleeding. NPO for testing however declines.           Objective:     BP 113/43   Pulse 72   Temp 98.2 ??F (36.8 ??C)   Resp 18   Ht 5\' 6"  (1.676 m)   Wt 82.645 kg (182 lb 3.2 oz)   BMI 29.41 kg/m2   SpO2 94% O2 Flow Rate (L/min): 2 l/min O2 Device: Nasal cannula  Temp (24hrs), Avg:98.5 ??F (36.9 ??C), Min:97.3 ??F (36.3 ??C), Max:99.8 ??F (37.7 ??C)      03/28 0700 - 03/28 1859  In: -   Out: 50 [Urine:50]    03/26 1900 - 03/28 0659  In: 1245.3 [I.V.:1245.3]  Out: 575 [Urine:575]  TELE:     General: AAOx3 cooperative, no acute distress.  HEENT: Atraumatic. Pink and moist.  Anicteric sclerae.  Neck : Supple, no thyromegaly.   Lungs: CTA bilaterally. No wheezing/rhonchi/rales.  Heart: Regular rhythm, no murmur, no rubs, no gallops. No JVD. No carotid bruits.   Abdomen: Soft, non-distended, non-tender. + Bowel sounds. No bruits.  Extremities: No edema, no clubbing, no cyanosis. No calf tenderness  Neurologic: Grossly intact.  Alert and oriented X 3.  No acute neurological distress.   Psych: Good insight. Not anxious nor agitated.        Care Plan discussed with:    Comments   Patient y    Family      RN y    Futures trader                    Consultant:          Data Review:     Recent Labs    Basename 08/21/11 1625    ITNL 0.56*      Recent Labs   Basename 08/22/11 0045 08/21/11 1830    CPK 331* 288    CKMB -- --    TROIQ 2.21* 1.63*     Recent Labs   Basename 08/22/11 0045 08/21/11 1830 08/21/11 1630    NA 136 131* --    K 4.7 5.3* --    CL 102 96* --    CO2 28 22 --    BUN 42* 42* --    CREA 2.63* 2.30* --    GLU 137* 526* --    PHOS -- -- --    MG -- 1.9 --    ALB -- -- --    WBC 11.3* -- 13.2*    HGB 7.8* -- 9.4*    HCT 23.3* -- 28.7*    PLT 189 -- 179     No results found for this basename: INR:3,PTP:3,APTT:3, in the last 72 hours    Medications reviewed  Current Facility-Administered Medications   Medication Dose Route Frequency   ??? insulin glargine (LANTUS) injection 25 Units  25 Units SubCUTAneous DAILY   ??? insulin lispro (HUMALOG) injection   SubCUTAneous AC&HS   ??? dextrose (D50W) injection Syrg 12.5-25 g  12.5-25 g IntraVENous PRN   ??? aspirin (ASPIRIN) tablet 325 mg  325 mg Oral NOW   ??? sodium chloride 0.9 % bolus infusion 1,000 mL  1,000 mL IntraVENous ONCE   ??? insulin regular (NOVOLIN, HUMULIN) injection 10 Units  10 Units SubCUTAneous NOW   ??? sodium polystyrene (KAYEXALATE) 15 gram/60 mL oral suspension 15 g  15 g Oral NOW   ??? aspirin chewable tablet 81 mg  81 mg Oral DAILY   ??? atorvastatin (LIPITOR) tablet 40 mg  40 mg Oral QHS   ??? clopidogrel (PLAVIX) tablet 75 mg  75 mg Oral DAILY   ??? ferrous sulfate tablet 325 mg  325 mg Oral ACB&D   ??? fish oil-omega-3 fatty acids 340-1,000 mg capsule 1 Cap  1 Cap Oral BID   ??? isosorbide mononitrate ER (IMDUR) tablet 30 mg  30 mg Oral DAILY   ??? levothyroxine (SYNTHROID) tablet 150 mcg  150 mcg Oral ACB   ???  metoprolol (LOPRESSOR) tablet 100 mg  100 mg Oral BID   ??? misoprostol (CYTOTEC) tablet 200 mcg  200 mcg Oral BID   ??? nitroglycerin (NITROSTAT) tablet 0.4 mg  0.4 mg SubLINGual PRN   ??? oxyCODONE-acetaminophen (PERCOCET 7.5) 7.5-325 mg per tablet 1 Tab  1 Tab Oral Q4H PRN   ??? terazosin (HYTRIN) capsule 10 mg  10 mg Oral QHS   ??? acetaminophen (TYLENOL)  tablet 650 mg  650 mg Oral Q4H PRN   ??? ondansetron (ZOFRAN) injection 4 mg  4 mg IntraVENous Q4H PRN   ??? 0.9% sodium chloride infusion  75 mL/hr IntraVENous CONTINUOUS   ??? heparin (porcine) injection 5,000 Units  5,000 Units SubCUTAneous Q8H   ??? pantoprazole (PROTONIX) tablet 40 mg  40 mg Oral ACB   ??? sodium polystyrene (KAYEXALATE) 15 gram/60 mL oral suspension 30 g  30 g Oral NOW   ??? azithromycin (ZITHROMAX) 500 mg in 0.9% sodium chloride 250 mL IVPB  500 mg IntraVENous Q24H   ??? cefTRIAXone (ROCEPHIN) 1 g in 0.9% sodium chloride (MBP/ADV) 50 mL MBP  1 g IntraVENous Q24H   ??? levalbuterol (XOPENEX) nebulizer soln 0.63 mg/3 ml  0.63 mg Nebulization Q4H PRN   ??? famotidine (PEPCID) tablet 20 mg  20 mg Oral BID   ??? glucose chewable tablet 16 g  4 Tab Oral PRN   ??? dextrose (D50W) injection Syrg 12.5-25 g  12.5-25 g IntraVENous PRN   ??? glucagon (GLUCAGEN) injection 1 mg  1 mg IntraMUSCular PRN   ??? insulin regular (NOVOLIN, HUMULIN) injection 15 Units  15 Units SubCUTAneous ONCE         Stephanie A. Vilma Prader, NP

## 2011-08-22 NOTE — Progress Notes (Signed)
Report given to shakisa, Charity fundraiser. SBAR, Kardex, Intake/Output, MAR or Recent Results were discussed. shakisa, RN assumed care of the pt.    Angelena Sole, RN

## 2011-08-22 NOTE — Other (Signed)
Cardiopulmonary Rehab:  Patient has been followed for CHF teaching at previous admission.  Patient offered CHF Teaching Packet and printed teaching handouts on heart healthy/low salt (1500 mg sodium) diet.  Patient stated that he has this at home and declined additional packet.   Patient given printed teaching handout on Ranexa.  Patient instructed briefly in review on daily weight monitoring, signs and symptoms of fluid retention to notify physician, heart healthy/low salt (1500 mg) sodium diet, and taking medications as prescribed.  Patient quit smoking in 1980.  Patient voiced understanding of instructions.  Patient has EF 40%.  See note of Selena Lesser, Nurse Practitioner of today, 08/22/2011.  Will follow.

## 2011-08-22 NOTE — Progress Notes (Signed)
[facility]  [address] Leonette Monarch [place], [place] [address]  [phone]      Medical Progress Note      NAME: Travis [name]   DOB:  06-04-1940  MRM:  [id number]    Date/Time: 08/22/2011  8:06 AM         Subjective:     Chief Complaint:  Chest pain: no further, intermittent, yesterday, moderate    ROS:  (bold if positive, if negative)                       Tolerating Diet          Objective:       Vitals:          Last 24hrs VS reviewed since prior progress note. Most recent are:    Visit Vitals   Item Reading   ??? BP 113/43   ??? Pulse 72   ??? Temp 98.2 ??F (36.8 ??C)   ??? Resp 18   ??? Ht 5\' 6"  (1.676 m)   ??? Wt 182 lb 3.2 oz   ??? BMI 29.41 kg/m2   ??? SpO2 94%     SpO2 Readings from Last 6 Encounters:   08/21/11 94%   08/13/11 99%   08/13/11 99%   07/24/11 98%   05/25/11 97%   11/13/09 98%    O2 Flow Rate (L/min): 2 l/min     Intake/Output Summary (Last 24 hours) at 08/22/11 6295  Last data filed at 08/22/11 0630   Gross per 24 hour   Intake 1245.31 ml   Output    575 ml   Net 670.31 ml          Exam:     Physical Exam:    Gen:  Well-developed, obese, in no acute distress  HEENT:  Pink conjunctivae, PERRL, hearing intact to voice, moist mucous membranes  Neck:  Supple, without masses, thyroid non-tender  Resp:  No accessory muscle use, clear breath sounds without wheezes rales or rhonchi  Card:  No murmurs, normal S1, S2 without thrills, bruits or peripheral edema  Abd:  Soft, non-tender, non-distended, normoactive bowel sounds are present, no palpable organomegaly and no detectable hernias  Lymph:  No cervical or inguinal adenopathy  Musc:  No cyanosis or clubbing  Skin:  No rashes or ulcers, skin turgor is good  Neuro:  Cranial nerves are grossly intact, no focal motor weakness, follows commands appropriately  Psych:  Good insight, oriented to person, place and time, alert       Telemetry reviewed:   normal sinus rhythm    Medications Reviewed: (see below)    Lab Data Reviewed: (see below)     ______________________________________________________________________    Medications:     Current Facility-Administered Medications   Medication Dose Route Frequency   ??? insulin glargine (LANTUS) injection 25 Units  25 Units SubCUTAneous DAILY   ??? insulin lispro (HUMALOG) injection   SubCUTAneous AC&HS   ??? dextrose (D50W) injection Syrg 12.5-25 g  12.5-25 g IntraVENous PRN   ??? aspirin (ASPIRIN) tablet 325 mg  325 mg Oral NOW   ??? sodium chloride 0.9 % bolus infusion 1,000 mL  1,000 mL IntraVENous ONCE   ??? insulin regular (NOVOLIN, HUMULIN) injection 10 Units  10 Units SubCUTAneous NOW   ??? sodium polystyrene (KAYEXALATE) 15 gram/60 mL oral suspension 15 g  15 g Oral NOW   ??? aspirin chewable tablet 81 mg  81 mg  Oral DAILY   ??? atorvastatin (LIPITOR) tablet 40 mg  40 mg Oral QHS   ??? clopidogrel (PLAVIX) tablet 75 mg  75 mg Oral DAILY   ??? ferrous sulfate tablet 325 mg  325 mg Oral ACB&D   ??? fish oil-omega-3 fatty acids 340-1,000 mg capsule 1 Cap  1 Cap Oral BID   ??? isosorbide mononitrate ER (IMDUR) tablet 30 mg  30 mg Oral DAILY   ??? levothyroxine (SYNTHROID) tablet 150 mcg  150 mcg Oral ACB   ??? metoprolol (LOPRESSOR) tablet 100 mg  100 mg Oral BID   ??? misoprostol (CYTOTEC) tablet 200 mcg  200 mcg Oral BID   ??? nitroglycerin (NITROSTAT) tablet 0.4 mg  0.4 mg SubLINGual PRN   ??? oxyCODONE-acetaminophen (PERCOCET 7.5) 7.5-325 mg per tablet 1 Tab  1 Tab Oral Q4H PRN   ??? terazosin (HYTRIN) capsule 10 mg  10 mg Oral QHS   ??? acetaminophen (TYLENOL) tablet 650 mg  650 mg Oral Q4H PRN   ??? ondansetron (ZOFRAN) injection 4 mg  4 mg IntraVENous Q4H PRN   ??? 0.9% sodium chloride infusion  75 mL/hr IntraVENous CONTINUOUS   ??? heparin (porcine) injection 5,000 Units  5,000 Units SubCUTAneous Q8H   ??? pantoprazole (PROTONIX) tablet 40 mg  40 mg Oral ACB   ??? sodium polystyrene (KAYEXALATE) 15 gram/60 mL oral suspension 30 g  30 g Oral NOW   ??? azithromycin (ZITHROMAX) 500 mg in 0.9% sodium chloride 250 mL IVPB  500 mg IntraVENous Q24H   ???  cefTRIAXone (ROCEPHIN) 1 g in 0.9% sodium chloride (MBP/ADV) 50 mL MBP  1 g IntraVENous Q24H   ??? levalbuterol (XOPENEX) nebulizer soln 0.63 mg/3 ml  0.63 mg Nebulization Q4H PRN   ??? famotidine (PEPCID) tablet 20 mg  20 mg Oral BID   ??? glucose chewable tablet 16 g  4 Tab Oral PRN   ??? dextrose (D50W) injection Syrg 12.5-25 g  12.5-25 g IntraVENous PRN   ??? glucagon (GLUCAGEN) injection 1 mg  1 mg IntraMUSCular PRN   ??? insulin regular (NOVOLIN, HUMULIN) injection 15 Units  15 Units SubCUTAneous ONCE            Lab Review:     Recent Labs   Basename 08/22/11 0045 08/21/11 1630    WBC 11.3* 13.2*    HGB 7.8* 9.4*    HCT 23.3* 28.7*    PLT 189 179     Recent Labs   Basename 08/22/11 0045 08/21/11 1830    NA 136 131*    K 4.7 5.3*    CL 102 96*    CO2 28 22    GLU 137* 526*    BUN 42* 42*    CREA 2.63* 2.30*    CA 7.6* 7.9*    MG -- 1.9    PHOS -- --    ALB -- --    TBIL -- --    SGOT -- --    INR -- --     Lab Results   Component Value Date/Time    POC GLUCOSE 135 08/22/2011  6:26 AM    POC GLUCOSE 108 08/22/2011  5:26 AM    POC GLUCOSE 86 08/22/2011  4:29 AM    POC GLUCOSE 60 08/22/2011  4:16 AM    POC GLUCOSE 94 08/22/2011  3:18 AM     No results found for this basename: PH:3,PCO2:3,PO2:3,HCO3:3,FIO2:3 in the last 72 hours  No results found for this basename: INR:3 in the last 72  hours  Lab Results   Component Value Date/Time    Specimen Description: BLOOD 08/21/2011  6:35 PM    Specimen Description: NARES 09/29/2009 11:45 PM    Specimen Description: NARES 08/08/2009 10:50 PM     Lab Results   Component Value Date/Time    Culture result: NO GROWTH AFTER 12 HOURS 08/21/2011  6:35 PM    Culture result:  Value: MRSA NOT PRESENT     Screening of patient nares for MRSA is for surveillance purposes and, if positive, to facilitate isolation considerations in high risk settings. It is not intended for automatic decolonization interventions per se as regimens are not sufficiently effective to warrant routine use. 09/29/2009 11:45 PM     Culture result:  Value: MRSA NOT PRESENT     Screening of patient nares for MRSA is for surveillance purposes and, if positive, to facilitate isolation considerations in high risk settings. It is not intended for automatic decolonization interventions per se as regimens are not sufficiently effective to warrant routine use. 08/08/2009 10:50 PM            Assessment:     Principal Problem:   *Chest pain, unspecified (08/21/2011)  Active Problems:     PAD (peripheral artery disease) (05/18/2011)   PVR tracing 05/18/2011 suggest bilateral SFA occlusion and calf vessel    disease    and high grade stenosis of his proximal fem-pop   Angio March 2012 ([facility]): r sfa-ak pop with prox stenosis, 1 vs r/o; left    diffuse sfa , severe tpt disease     Acute on chronic renal failure (08/21/2011)     Pneumonia, organism unspecified (08/21/2011)     Leukocytosis, unspecified (08/21/2011)     DM (diabetes mellitus) (08/21/2011)     Anemia of other chronic disease (08/21/2011)     Other and unspecified hyperlipidemia (08/21/2011)         Plan:     Principal Problem:   *Chest pain, unspecified (08/21/2011)   - positive enzymes, actually rising, will leave it to Cardiology whether this is actually NSTEMI   - continue medical management as above     PAD (peripheral artery disease) (05/18/2011)   - outpatient evaluation     Acute on chronic renal failure (08/21/2011)   - creatinine up further   - has been seen by Lucia Estelle in past   - will have Renal see again today     Pneumonia, organism unspecified (08/21/2011)   - continue antibiotics as above     Leukocytosis, unspecified (08/21/2011)   - WBC falling on antibiotics     DM (diabetes mellitus) (08/21/2011)   - BS much better on drip   - will transition to Lantus this AM     Anemia of other chronic disease (08/21/2011)   - Hgb stable     Other and unspecified hyperlipidemia (08/21/2011)   - continue statin      Total time spent with patient: 35 minutes                  Care Plan discussed with: Patient  and Nursing Staff    Discussed:  Care Plan and D/C Planning    Prophylaxis:  Hep SQ    Disposition:  Home w/Family           ___________________________________________________    Attending Physician: Marijo File, MD

## 2011-08-22 NOTE — Progress Notes (Signed)
Pt's blood glucose AC dinner 422. Received order for humalog 20 units subcutaneous x 1.

## 2011-08-22 NOTE — Progress Notes (Signed)
Spiritual Care Assessment/Progress Notes    Travis [name] [id number]  [id number]    10/10/1940  71 y.o.  male    Patient Telephone Number: [phone] (home)   Religious Affiliation: Non denominational   Language: English   Extended Emergency Contact Information  Primary Emergency Contact: W,[name]   [place]  Home Phone: [phone]  Relation: Child   Patient Active Problem List   Diagnoses Date Noted   ??? Chest pain, unspecified 08/21/2011   ??? Acute on chronic renal failure 08/21/2011   ??? Pneumonia, organism unspecified 08/21/2011   ??? Leukocytosis, unspecified 08/21/2011   ??? DM (diabetes mellitus) 08/21/2011   ??? Anemia of other chronic disease 08/21/2011   ??? Other and unspecified hyperlipidemia 08/21/2011   ??? CKD (chronic kidney disease) stage 3, GFR 30-59 ml/min 07/18/2011   ??? Foot ulcer 07/18/2011   ??? PAD (peripheral artery disease) 05/18/2011   ??? Ischemic toe ulcer 05/18/2011   ??? NSTEMI (non-ST elevated myocardial infarction) 05/16/2011   ??? DKA, type 2, not at goal 05/16/2011   ??? Unspecified hypothyroidism 05/16/2011   ??? HTN (hypertension) 10/13/2009   ??? Hyperlipidemia 10/06/2009   ??? Anemia, unspecified 09/29/2009   ??? Back pain 09/29/2009   ??? CAD (coronary artery disease)         Date: 08/22/2011       Level of Religious/Spiritual Activity:  []          Involved in faith tradition/spiritual practice    [x]          Not involved in faith tradition/spiritual practice  []          Spiritually oriented    []          Claims no spiritual orientation    []          seeking spiritual identity  []          Feels alienated from religious practice/tradition  []          Feels angry about religious practice/tradition  [x]          Spirituality/religious tradition is a Theatre stage manager for coping at this time.  []          Not able to assess due to medical condition    Services Provided Today:  []          crisis intervention    []          reading Scriptures  [x]          spiritual assessment    []          prayer  [x]           empathic listening/emotional support  []          rites and rituals (cite in comments)  [x]          life review     []          religious support  [x]          theological development   []          advocacy  [x]          ethical dialog     []          blessing  []          bereavement support    []          support to family  []          anticipatory grief support   []          help with AMD  [  x]         spiritual guidance    []          meditation      Spiritual Care Needs  [x]          Emotional Support  [x]          Spiritual/Religious Care  []          Loss/Adjustment  []          Advocacy/Referral /Ethics  []          No needs expressed at this time  []          Other: (note in comments)  Spiritual Care Plan  []          Follow up visits with pt/family  []          Provide materials  []          Schedule sacraments  []          Contact Community Clergy  [x]          Follow up as needed  []          Other: (note in comments)     Comments: Completed initial visit and Spiritual Assessment. Provided support and presence. Offered empathic listening. Patient has strong views political and otherwise. Advised of Chaplain availability. Will follow up as able and or as needed.       Joy D. Timmie Foerster

## 2011-08-22 NOTE — Consults (Signed)
Consult dictated    ARF on CKD    What is interesting, is that his Cr was well below what I thought was his baseline last month. It looks like he had a cath and got IVF. He was possibly off the ACEI then.    So I am wondering, ?does he have renovascular disease and his Cr is much higher on ACEI. Or does he have urinary retention (he has a lot of obstructive voiding symptoms)?, or is he just kept on the dry side with loop diuretics?    Plan:  On IVF, off loops and ACE  Check renal duplex  Check PVR  Serial labs  Urine lytes  Will follow

## 2011-08-22 NOTE — Progress Notes (Signed)
Pt voided 100 ml of clear amber urine. Post void residual was 71.  Pt taken off unit via stretcher for renal duplex by transport

## 2011-08-22 NOTE — Progress Notes (Signed)
Pt had 17 beat run of v-tach on tele monitor. Pt asymptomatic, currently sitting on side of the bed. Notified cardiologist Dr. Welton Flakes via telephone. Will continue to monitor pt.

## 2011-08-22 NOTE — Progress Notes (Signed)
Bedside and Verbal shift change report given to Shirley RN (oncoming nurse) by Shakisa RN (offgoing nurse).  Report given with SBAR, Kardex, Intake/Output, MAR and Recent Results.

## 2011-08-22 NOTE — Progress Notes (Signed)
Renal artery exam performed, final report to follow.

## 2011-08-22 NOTE — Other (Signed)
DTC Progress Note    Recommendations/ Comments: Noted pt admitted with BS 526mg /dl and started on insulin drip.  Pt's drip target was 100-180mg /dl and he reached target in 5 hours and had one low BS while on the drip.  Drip is now d/c and 25 units of Lantus have been administered and on lispro insulin correction scale.      Chart reviewed on Travis [name].    Patient is a 71 y.o. male with known  Type 2 Diabetes on insulin injections: Lantus : 25 units daily; aspart bid,  at home.    A1c:   Lab Results   Component Value Date/Time    Hemoglobin A1c 8.6 08/21/2011  4:30 PM       POC Glucose last 24hrs:   Lab Results   Component Value Date/Time    POC GLUCOSE 189 08/22/2011  8:38 AM    POC GLUCOSE 146 08/22/2011  7:29 AM    POC GLUCOSE 135 08/22/2011  6:26 AM    POC GLUCOSE 108 08/22/2011  5:26 AM    POC GLUCOSE 86 08/22/2011  4:29 AM    POC GLUCOSE 60 08/22/2011  4:16 AM    POC GLUCOSE 94 08/22/2011  3:18 AM    POC GLUCOSE 59 08/22/2011  3:02 AM         Lab Results   Component Value Date/Time    Creatinine 2.63 08/22/2011 12:45 AM       PO intake: No data found.        Current hospital DM medications: Lantus 25 units am; lispro insulin correction scale    Will continue to follow as needed.    Thank you.    Evlyn Kanner, RD, CDE

## 2011-08-22 NOTE — Consults (Signed)
Name:       Travis [name], Travis [name]          Admitted:          08/21/2011                                         DOB:               Nov 07, 1940  Account #:  [id number]               Age:               71  Consultant: Latricia Heft, MD          Location                                CONSULTATION REPORT    DATE OF CONSULTATION           08/22/2011      REQUESTING PHYSICIAN: Dr. Verlin Fester    CHIEF COMPLAINT: Elevated creatinine.    HISTORY OF PRESENT ILLNESS: The patient is a 71 year old [place] American  male, who is followed by me in the office. I have been seeing him for about  a year for chronic kidney disease, and his creatinine has ranged around 1.5  and 2.5 during that period of time. Interestingly, the patient was  hospitalized here last month and during his hospitalization, underwent  cardiac catheterization. It appears that his serum creatinine actually  improved well below what I thought was prior baseline at 1.1 mg/dL. He was  discharged home and resume his home medications. The patient is now  admitted with some foot pain related to a recent amputation. His serum  creatinine is up over 2 mg per deciliter we are asked to see him for  worsening renal function.    The patient has a history of coronary artery disease and peripheral  vascular disease. He is post-multiple interventions in the past. He has a  history of hypertension, for which he takes a multidrug regimen including  an ACE inhibitor. He has a history of CHF with modestly reduced left  ventricular systolic ejection fraction. Most recent echocardiogram that I  can find in the medical records indicates an apparently normal right  ventricle, however. The patient has a history of BPH and he takes  Terazosin.    REVIEW OF SYSTEMS  CONSTITUTIONAL: No fevers or chills.  GASTROINTESTINAL: No nausea, vomiting or diarrhea.  CARDIOVASCULAR: Some brief chest pain 2 days ago, which did not concern  him. No shortness of breath.  GENITOURINARY: Positive for double  voiding, positive for hesitancy.  Positive for poor urine stream.  All other systems reviewed and are negative, except as per history of  present illness.    PAST MEDICAL HISTORY: Chronic kidney disease stage III, hypertension,  coronary artery disease, hypothyroidism, BPH, peripheral vascular disease,  type 2 diabetes mellitus, obstructive sleep apnea, hyperlipidemia, anemia  of chronic kidney disease, Vitamin D deficiency.    FAMILY HISTORY: No family history of renal disease.    SOCIAL HISTORY: He is a former smoker.    PHYSICAL EXAMINATION  VITAL SIGNS: Temperature 98.2, pulse 72, blood pressure 113/43.  GENERAL: Pleasant African American male in no acute distress.  EYES: Sclerae are anicteric. Pupils are round and reactive.  ENMT: Mucous membranes are  moist. There is no external auditory canal  discharge.  NECK: Nontender with no masses. Thyroid is not enlarged.  CARDIOVASCULAR: Heart is regular S1, S2.  EXTREMITIES: There is no lower extremity edema.  RESPIRATORY: Lungs clear with good respiratory effort.  GASTROINTESTINAL: Abdomen is soft and nontender. Bowel sounds are active.  Hepatosplenomegaly is not appreciated.  SKIN: Warm with normal turgor. There is no rash.  MUSCULOSKELETAL: There is no cyanosis or clubbing. There is no synovitis of  the fingers or wrists bilaterally.    LABORATORY DATA: Sodium is 136; potassium 4.7, chloride 102, bicarbonate  28, BUN 42, creatinine 2.63.    ASSESSMENT: Acute superimposed on chronic kidney disease. What is most  interesting to me is that his serum creatinine last month was measured well  below his baseline. During that time, it appears he received intravenous  fluids in preparation for prophylaxis around the time of coronary  angiography.    While I am wondering is, is he just kept on the dry side with diuretics at  baseline and thus tends to run a higher serum creatinine.  Does he have  undiagnosed vascular disease and runs a higher serum creatinine when on an  ACE  inhibitor.   Does he have chronic urinary retention as he has a good  number of obstructive voiding symptom.  or is something else going o.    PLAN  1. Continue gentle IV fluids as you are doing.  2. Continue to hold loop diuretic and ACE inhibitor.  3. Check renal artery duplex while he is here.  4. Check postvoid residual and pursue that if elevated.  5. Check urine electrolytes.  6. Monitor serial labs.  7. Avoid nephrotoxins if possible.  8. We will follow with you to assist in his management during this  hospitalization.                Latricia Heft, MD    cc:                       Latricia Heft, MD      DGG/wmx; D: 08/22/2011 08:51 A; T: 08/22/2011 09:12 A; DOC# [id number]; Job#  [id number]

## 2011-08-23 LAB — CK W/ CKMB & INDEX
CK - MB: 15.3 NG/ML — ABNORMAL HIGH (ref 0.5–3.6)
CK-MB Index: 4.5 — ABNORMAL HIGH (ref 0–2.5)
CK: 338 U/L — ABNORMAL HIGH (ref 39–308)

## 2011-08-23 LAB — GLUCOSE, POC
Glucose (POC): 317 mg/dL — ABNORMAL HIGH (ref 75–110)
Glucose (POC): 205 mg/dL — ABNORMAL HIGH (ref 75–110)
Glucose (POC): 148 mg/dL — ABNORMAL HIGH (ref 75–110)
Glucose (POC): 182 mg/dL — ABNORMAL HIGH (ref 75–110)
Glucose (POC): 142 mg/dL — ABNORMAL HIGH (ref 75–110)

## 2011-08-23 LAB — METABOLIC PANEL, BASIC
Anion gap: 8 mmol/L (ref 5–15)
BUN/Creatinine ratio: 22 — ABNORMAL HIGH (ref 12–20)
BUN: 44 MG/DL — ABNORMAL HIGH (ref 6–20)
CO2: 28 MMOL/L (ref 21–32)
Calcium: 7.7 MG/DL — ABNORMAL LOW (ref 8.5–10.1)
Chloride: 102 MMOL/L (ref 97–108)
Creatinine: 2.01 MG/DL — ABNORMAL HIGH (ref 0.45–1.15)
GFR est AA: 40 mL/min/{1.73_m2} — ABNORMAL LOW (ref >60)
GFR est non-AA: 33 mL/min/{1.73_m2} — ABNORMAL LOW (ref >60)
Glucose: 118 MG/DL — ABNORMAL HIGH (ref 65–100)
Potassium: 4.1 MMOL/L (ref 3.5–5.1)
Sodium: 138 MMOL/L (ref 136–145)

## 2011-08-23 LAB — CBC W/O DIFF
HCT: 26.1 % — ABNORMAL LOW (ref 36.6–50.3)
HGB: 8.5 g/dL — ABNORMAL LOW (ref 12.1–17.0)
MCH: 27.9 PG (ref 26.0–34.0)
MCHC: 32.6 g/dL (ref 30.0–36.5)
MCV: 85.6 FL (ref 80.0–99.0)
PLATELET: 199 10*3/uL (ref 150–400)
RBC: 3.05 M/uL — ABNORMAL LOW (ref 4.10–5.70)
RDW: 17 % — ABNORMAL HIGH (ref 11.5–14.5)
WBC: 9.8 10*3/uL (ref 4.1–11.1)

## 2011-08-23 LAB — TROPONIN I: Troponin-I, Qt.: 6.44 ng/mL — ABNORMAL HIGH (ref <0.05)

## 2011-08-23 LAB — PHOSPHORUS: Phosphorus: 2.7 MG/DL (ref 2.5–4.9)

## 2011-08-23 LAB — MAGNESIUM: Magnesium: 2.2 MG/DL (ref 1.6–2.4)

## 2011-08-23 MED ORDER — INSULIN GLARGINE 100 UNIT/ML INJECTION
100 unit/mL | Freq: Every day | SUBCUTANEOUS | Status: DC
Start: 2011-08-23 — End: 2011-08-26
  Administered 2011-08-23 – 2011-08-25 (×3): via SUBCUTANEOUS

## 2011-08-23 MED ORDER — EPOETIN ALFA 20,000 UNIT/ML IJ SOLN
20000 unit/mL | INTRAMUSCULAR | Status: DC
Start: 2011-08-23 — End: 2011-09-03
  Administered 2011-08-23 – 2011-08-30 (×2): via SUBCUTANEOUS

## 2011-08-23 MED ORDER — GABAPENTIN 300 MG CAP
300 mg | Freq: Three times a day (TID) | ORAL | Status: DC
Start: 2011-08-23 — End: 2011-08-27
  Administered 2011-08-23 – 2011-08-27 (×12): via ORAL

## 2011-08-23 MED ORDER — OXYCODONE CR 10 MG 12 HR TAB
10 mg | Freq: Two times a day (BID) | ORAL | Status: DC
Start: 2011-08-23 — End: 2011-08-26
  Administered 2011-08-24 – 2011-08-26 (×6): via ORAL

## 2011-08-23 MED FILL — FERROUS SULFATE 325 MG (65 MG ELEMENTAL IRON) TAB: 325 mg (65 mg iron) | ORAL | Qty: 1

## 2011-08-23 MED FILL — GABAPENTIN 300 MG CAP: 300 mg | ORAL | Qty: 1

## 2011-08-23 MED FILL — HEPARIN (PORCINE) 5,000 UNIT/ML IJ SOLN: 5000 unit/mL | INTRAMUSCULAR | Qty: 1

## 2011-08-23 MED FILL — METOPROLOL TARTRATE 50 MG TAB: 50 mg | ORAL | Qty: 2

## 2011-08-23 MED FILL — TERAZOSIN 5 MG CAP: 5 mg | ORAL | Qty: 2

## 2011-08-23 MED FILL — INSULIN LISPRO 100 UNIT/ML INJECTION: 100 unit/mL | SUBCUTANEOUS | Qty: 1

## 2011-08-23 MED FILL — ASPIRIN 81 MG CHEWABLE TAB: 81 mg | ORAL | Qty: 1

## 2011-08-23 MED FILL — ISOSORBIDE MONONITRATE SR 30 MG 24 HR TAB: 30 mg | ORAL | Qty: 1

## 2011-08-23 MED FILL — FAMOTIDINE 20 MG TAB: 20 mg | ORAL | Qty: 1

## 2011-08-23 MED FILL — FISH OIL 340 MG-1,000 MG CAPSULE: 340-1000 mg | ORAL | Qty: 1

## 2011-08-23 MED FILL — MISOPROSTOL 200 MCG TAB: 200 mcg | ORAL | Qty: 1

## 2011-08-23 MED FILL — ZITHROMAX 500 MG INTRAVENOUS SOLUTION: 500 mg | INTRAVENOUS | Qty: 5

## 2011-08-23 MED FILL — PANTOPRAZOLE 40 MG TAB, DELAYED RELEASE: 40 mg | ORAL | Qty: 1

## 2011-08-23 MED FILL — INSULIN GLARGINE 100 UNIT/ML INJECTION: 100 unit/mL | SUBCUTANEOUS | Qty: 0.3

## 2011-08-23 MED FILL — INSULIN LISPRO 100 UNIT/ML INJECTION: 100 unit/mL | SUBCUTANEOUS | Qty: 2

## 2011-08-23 MED FILL — CEFTRIAXONE 1 GRAM SOLUTION FOR INJECTION: 1 gram | INTRAMUSCULAR | Qty: 1

## 2011-08-23 MED FILL — INSULIN GLARGINE 100 UNIT/ML INJECTION: 100 unit/mL | SUBCUTANEOUS | Qty: 0.25

## 2011-08-23 MED FILL — INSULIN LISPRO 100 UNIT/ML INJECTION: 100 unit/mL | SUBCUTANEOUS | Qty: 4

## 2011-08-23 MED FILL — PLAVIX 75 MG TABLET: 75 mg | ORAL | Qty: 1

## 2011-08-23 MED FILL — LEVOTHYROXINE 150 MCG TAB: 150 mcg | ORAL | Qty: 1

## 2011-08-23 MED FILL — LIPITOR 20 MG TABLET: 20 mg | ORAL | Qty: 2

## 2011-08-23 MED FILL — EPOETIN ALFA 20,000 UNIT/ML IJ SOLN: 20000 unit/mL | INTRAMUSCULAR | Qty: 1

## 2011-08-23 MED FILL — OXYCODONE-ACETAMINOPHEN 7.5 MG-325 MG TAB: ORAL | Qty: 1

## 2011-08-23 MED FILL — RANEXA 500 MG TABLET,EXTENDED RELEASE: 500 mg | ORAL | Qty: 1

## 2011-08-23 NOTE — Wound Image (Addendum)
Amputation of distal left great and second toe. By Delsa Grana, MD [facility]    Assessment:  WOUND POA CONDITIONS    Wound Toe Left;Plantar;amputation site   DRESSING STATUS Intact 08/23/2011  7:40 AM   DRESSING TYPE Gauze 08/22/2011  8:00 PM   Incision site well approximated? Yes 08/23/2011  1:03 PM   Non-Pressure Ulcer Full thickness (subcut/muscle) 08/23/2011  1:03 PM   Drainage Amount  Scant 08/23/2011  1:03 PM   Periwound Skin Condition Edematous 08/23/2011  1:03 PM   Cleansing and Cleansing Agents  Dermal wound cleanser 08/23/2011  1:03 PM   Dressing Type Applied Silicone 08/23/2011  1:03 PM   Procedure Tolerated Fair 08/23/2011  1:03 PM   Number of days:35       Recommend Allevyn dressing Multisite, request vascular Associate re eval increasing pain in foot    Will sign off please re consult if needed pt for gait and rocker bottom shoe    Discussed with Dr. Verlin Fester in agreement with plan

## 2011-08-23 NOTE — Procedures (Signed)
[facility]  *** FINAL REPORT ***    Name: LANE, ELAND  MRN: [id number]  DOB: 07-17-1940  Visit: [id number]  Date: 22 Aug 2011    TYPE OF TEST: Visceral Arterial Duplex    REASON FOR TEST  Eval for renal vascular HTN    Aortic PSV:  67.0 cm/s  Diameter AP: 1.8 cm   TV: 1.8 cm                   Right          Left  Renal Artery:- -------------  -------------  Proximal  PSV: 206.0          113.0  Mid       PSV:  Distal    PSV:  87.0           97.0  Aortic ratio :   3.1            1.7    Medullary PSV:  31.0           83.0            EDV:            EDR:            SDR:    Cortical  PSV:  48.0           35.0            EDV:            EDR:            SDR:  Stenosis:      Mild < 60 %    Normal  Kidney size:   11.9 cm        11.5 cm               x  6.0 cm      x  5.0 cm    Hilar:-        Right          Left  Acc. Time  AT:  74 secs           secs  Acc. Index AI:             RI:    Mesenteric:-                  Prox   Mid   Dist Ratio Stenosis          Aneurysm                  ----- ----- ----- ----- ----------------- ------------  SMA:  Celiac:         252.0               3.8 Severe > 70%  Hepatic:  Splenic:  IMA:  :    INTERPRETATION/FINDINGS  PROCEDURE: Evaluation of renal arteries with ultrasound (B-mode  imaging, pulsed Doppler, color Doppler). Includes the aorta, celiac  artery, superior mesenteric artery, renal arteries, segmental  arteries, and renal vein.    RENAL:  1. Mild < 60 % stenosis in the right renal artery.  2. No significant stenosis in the left renal artery.  3. The right kidney measures 11.9 cm.  4. The left kidney measures 11.5 cm.  MESENTERIC:  1. 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    NOTE: technically difficult exam, patinet has heavy respirtation.  Proximal renal arteries  are diffult to obtain secondary to lie of  vessels, patient's body habiuts, and heavy respiration.    ADDITIONAL COMMENTS    I have personally reviewed the data relevant to the  interpretation of  this  study.    TECHNOLOGIST:  Vivi Ferns T    PHYSICIAN:  Electronically signed by:  Dalene Carrow. Sheliah Hatch, MD  08/23/2011 11:39 AM

## 2011-08-23 NOTE — Progress Notes (Signed)
Bedside and Verbal shift change report given to Nettie Elm, RN and Bernette Redbird, RN (oncoming nurse) by Ralene Bathe, RN (offgoing nurse).  Report given with SBAR, Kardex, Intake/Output, MAR, Accordion and Recent Results.

## 2011-08-23 NOTE — Progress Notes (Signed)
01:10hr Patient  Sitting up on side of bed. States he felt like his blood sugar was low. Blood Glucose 205 per Accucheck.Clarene Essex, RN

## 2011-08-23 NOTE — Progress Notes (Signed)
Spoke with patient's daughter [name] she states " I am concerned about my dad's forgetfulness and falling at home. I don't think he can live alone anymore." Notified. Dr. Verlin Fester of this and received orders for case management consult for discharge disposition.

## 2011-08-23 NOTE — Progress Notes (Signed)
Temp 101.3.  Tylenol given.  Dr. Mylo Red notified.  Paired blood cultures ordered.  Phlebotomist called to draw.  Will monitor.

## 2011-08-23 NOTE — Progress Notes (Signed)
[facility]  [address] Leonette Monarch [place], [place] [address]  [phone]      Medical Progress Note      NAME: Travis [name]   DOB:  04-20-1941  MRM:  [id number]    Date/Time: 08/23/2011  8:13 AM          Subjective:     Chief Complaint:  Chest pain: no further, intermittent, yesterday, moderate    ROS:  (bold if positive, if negative)                       Tolerating Diet          Objective:       Vitals:          Last 24hrs VS reviewed since prior progress note. Most recent are:    Visit Vitals   Item Reading   ??? BP 156/65   ??? Pulse 98   ??? Temp 98 ??F (36.7 ??C)   ??? Resp 18   ??? Ht 5\' 6"  (1.676 m)   ??? Wt 182 lb 3.2 oz   ??? BMI 29.41 kg/m2   ??? SpO2 96%     SpO2 Readings from Last 6 Encounters:   08/23/11 96%   08/13/11 99%   08/13/11 99%   07/24/11 98%   05/25/11 97%   11/13/09 98%    O2 Flow Rate (L/min): 2 l/min       Intake/Output Summary (Last 24 hours) at 08/23/11 0813  Last data filed at 08/23/11 2355   Gross per 24 hour   Intake   2785 ml   Output    875 ml   Net   1910 ml          Exam:     Physical Exam:    Gen:  Well-developed, obese, in no acute distress  HEENT:  Pink conjunctivae, PERRL, hearing intact to voice, moist mucous membranes  Neck:  Supple, without masses, thyroid non-tender  Resp:  No accessory muscle use, clear breath sounds without wheezes rales or rhonchi  Card:  No murmurs, normal S1, S2 without thrills, bruits or peripheral edema  Abd:  Soft, non-tender, non-distended, normoactive bowel sounds are present, no palpable organomegaly and no detectable hernias  Lymph:  No cervical or inguinal adenopathy  Musc:  No cyanosis or clubbing  Skin:  No rashes or ulcers, skin turgor is good  Neuro:  Cranial nerves are grossly intact, no focal motor weakness, follows commands appropriately  Psych:  Good insight, oriented to person, place and time, alert       Telemetry reviewed:   normal sinus rhythm    Medications Reviewed: (see below)    Lab Data Reviewed: (see below)     ______________________________________________________________________    Medications:     Current Facility-Administered Medications   Medication Dose Route Frequency   ??? insulin glargine (LANTUS) injection 25 Units  25 Units SubCUTAneous DAILY   ??? insulin lispro (HUMALOG) injection   SubCUTAneous AC&HS   ??? dextrose (D50W) injection Syrg 12.5-25 g  12.5-25 g IntraVENous PRN   ??? ranolazine ER (RANEXA) tablet 500 mg  500 mg Oral DAILY   ??? famotidine (PEPCID) tablet 20 mg  20 mg Oral DAILY   ??? insulin lispro (HUMALOG) injection 20 Units  20 Units SubCUTAneous ONCE   ??? aspirin chewable tablet 81 mg  81 mg Oral DAILY   ??? atorvastatin (LIPITOR) tablet 40 mg  40 mg Oral  QHS   ??? clopidogrel (PLAVIX) tablet 75 mg  75 mg Oral DAILY   ??? ferrous sulfate tablet 325 mg  325 mg Oral ACB&D   ??? fish oil-omega-3 fatty acids 340-1,000 mg capsule 1 Cap  1 Cap Oral BID   ??? isosorbide mononitrate ER (IMDUR) tablet 30 mg  30 mg Oral DAILY   ??? levothyroxine (SYNTHROID) tablet 150 mcg  150 mcg Oral ACB   ??? metoprolol (LOPRESSOR) tablet 100 mg  100 mg Oral BID   ??? misoprostol (CYTOTEC) tablet 200 mcg  200 mcg Oral BID   ??? nitroglycerin (NITROSTAT) tablet 0.4 mg  0.4 mg SubLINGual PRN   ??? oxyCODONE-acetaminophen (PERCOCET 7.5) 7.5-325 mg per tablet 1 Tab  1 Tab Oral Q4H PRN   ??? terazosin (HYTRIN) capsule 10 mg  10 mg Oral QHS   ??? acetaminophen (TYLENOL) tablet 650 mg  650 mg Oral Q4H PRN   ??? ondansetron (ZOFRAN) injection 4 mg  4 mg IntraVENous Q4H PRN   ??? 0.9% sodium chloride infusion  75 mL/hr IntraVENous CONTINUOUS   ??? heparin (porcine) injection 5,000 Units  5,000 Units SubCUTAneous Q8H   ??? pantoprazole (PROTONIX) tablet 40 mg  40 mg Oral ACB   ??? azithromycin (ZITHROMAX) 500 mg in 0.9% sodium chloride 250 mL IVPB  500 mg IntraVENous Q24H   ??? cefTRIAXone (ROCEPHIN) 1 g in 0.9% sodium chloride (MBP/ADV) 50 mL MBP  1 g IntraVENous Q24H   ??? levalbuterol (XOPENEX) nebulizer soln 0.63 mg/3 ml  0.63 mg Nebulization Q4H PRN   ??? glucose chewable  tablet 16 g  4 Tab Oral PRN   ??? dextrose (D50W) injection Syrg 12.5-25 g  12.5-25 g IntraVENous PRN   ??? glucagon (GLUCAGEN) injection 1 mg  1 mg IntraMUSCular PRN            Lab Review:     Recent Labs   Basename 08/23/11 0603 08/22/11 0045 08/21/11 1630    WBC 9.8 11.3* 13.2*    HGB 8.5* 7.8* 9.4*    HCT 26.1* 23.3* 28.7*    PLT 199 189 179     Recent Labs   Basename 08/23/11 0603 08/22/11 0045 08/21/11 1830    NA 138 136 131*    K 4.1 4.7 5.3*    CL 102 102 96*    CO2 28 28 22     GLU 118* 137* 526*    BUN 44* 42* 42*    CREA 2.01* 2.63* 2.30*    CA 7.7* 7.6* 7.9*    MG 2.2 -- 1.9    PHOS 2.7 -- --    ALB -- -- --    TBIL -- -- --    SGOT -- -- --    INR -- -- --     Lab Results   Component Value Date/Time    POC GLUCOSE 148 08/23/2011  7:23 AM    POC GLUCOSE 205 08/23/2011  1:08 AM    POC GLUCOSE 317 08/22/2011  9:46 PM    POC GLUCOSE 422 08/22/2011  4:19 PM    POC GLUCOSE 294 08/22/2011 12:06 PM     No results found for this basename: PH:3,PCO2:3,PO2:3,HCO3:3,FIO2:3 in the last 72 hours  No results found for this basename: INR:3 in the last 72 hours  Lab Results   Component Value Date/Time    Specimen Description: BLOOD 08/21/2011  6:35 PM    Specimen Description: NARES 09/29/2009 11:45 PM    Specimen Description: NARES 08/08/2009 10:50 PM  Lab Results   Component Value Date/Time    Culture result: NO GROWTH 2 DAYS 08/21/2011  6:35 PM    Culture result:  Value: MRSA NOT PRESENT     Screening of patient nares for MRSA is for surveillance purposes and, if positive, to facilitate isolation considerations in high risk settings. It is not intended for automatic decolonization interventions per se as regimens are not sufficiently effective to warrant routine use. 09/29/2009 11:45 PM    Culture result:  Value: MRSA NOT PRESENT     Screening of patient nares for MRSA is for surveillance purposes and, if positive, to facilitate isolation considerations in high risk settings. It is not intended for automatic decolonization  interventions per se as regimens are not sufficiently effective to warrant routine use. 08/08/2009 10:50 PM            Assessment:     Principal Problem:   *Chest pain, unspecified (08/21/2011)  Active Problems:     PAD (peripheral artery disease) (05/18/2011)   PVR tracing 05/18/2011 suggest bilateral SFA occlusion and calf vessel    disease    and high grade stenosis of his proximal fem-pop   Angio March 2012 ([facility]): r sfa-ak pop with prox stenosis, 1 vs r/o; left    diffuse sfa , severe tpt disease     Acute on chronic renal failure (08/21/2011)     Pneumonia, organism unspecified (08/21/2011)     Leukocytosis, unspecified (08/21/2011)     DM (diabetes mellitus) (08/21/2011)     Anemia of other chronic disease (08/21/2011)     Other and unspecified hyperlipidemia (08/21/2011)         Plan:     Principal Problem:   *Chest pain, unspecified (08/21/2011)   - positive enzymes, actually rising, will leave it to Cardiology whether this is actually NSTEMI   - continue medical management as above, Ranexa started   - Cardiology considering cardiac MRI for viability and while I think this is reasonable, I do NOT think Mr. Tiso will follow up as an outpatient to have this done   - renal function has improved, would consider doing it here and now if revascularization is seriously being considered   - I discussed this with Mr. Ofallon and he says he will do whatever the cardiologist recommends     PAD (peripheral artery disease) (05/18/2011)   - outpatient evaluation     Acute on chronic renal failure (08/21/2011)   - creatinine up further   - has been seen by Lucia Estelle in past   - will have Renal see again today     Pneumonia, organism unspecified (08/21/2011)   - continue antibiotics as above     Leukocytosis, unspecified (08/21/2011)   - resolved on antibiotics     DM (diabetes mellitus) (08/21/2011)   - BS up once drip stopped   - will increase Lantus, hesitate to increase dose too much as he has had problems with recurrent  hypoglycemia     Anemia of other chronic disease (08/21/2011)   - Hgb stable     Other and unspecified hyperlipidemia (08/21/2011)   - continue statin      Total time spent with patient: 35 minutes                  Care Plan discussed with: Patient, Nursing Staff and Consultant/Specialist    Discussed:  Care Plan and D/C Planning    Prophylaxis:  Hep SQ    Disposition:  Home  w/Family           ___________________________________________________    Attending Physician: Marijo Filelay  Jhaniya Briski, MD

## 2011-08-23 NOTE — Other (Signed)
DTC Progress Note        Chart reviewed on Travis [name]. Bg's elevated post d/c gtt. Noted that Lantus increased to 30 units today. Pt with hx problems with hypoglycemia so changes are being made cautiously.    Patient is a 71 y.o. male with Type 2 on Lantus 25 units at bedtime and Novolog BID at home.    A1c:   Lab Results   Component Value Date/Time    Hemoglobin A1c 8.6 08/21/2011  4:30 PM       POC Glucose last 24hrs:   Lab Results   Component Value Date/Time    POC GLUCOSE 182 08/23/2011 11:19 AM    POC GLUCOSE 148 08/23/2011  7:23 AM    POC GLUCOSE 205 08/23/2011  1:08 AM    POC GLUCOSE 317 08/22/2011  9:46 PM    POC GLUCOSE 422 08/22/2011  4:19 PM    POC GLUCOSE 294 08/22/2011 12:06 PM    POC GLUCOSE 189 08/22/2011  8:38 AM    POC GLUCOSE 146 08/22/2011  7:29 AM         Lab Results   Component Value Date/Time    Creatinine 2.01 08/23/2011  6:03 AM       PO intake: Patient Vitals for the past 72 hrs:   % Diet Eaten   08/23/11 0829 50 %   08/22/11 1809 50 %   08/22/11 1429 75 %         Current hospital DM medications: Lantus started at 25 units when gtt d/c'd. Increased to 30 units starting today. Correction scale Humalog.    Will continue to follow as needed.    Thank you.  Elenor Quinones RN, CDE

## 2011-08-23 NOTE — Progress Notes (Addendum)
Palliative Medicine Initial Consult  [phone]) [phone])    Patient Name: Travis [name]  Date of Birth: 1940/07/15  Date of Initial Consult: 08-23-11  Reason for Consult: severe CAD with chronic ischemia and recurrent MIs, Cardiology not sure if intervention will benefit patient; need help with long term decision making and pain control   Requesting Physician: Dr. Verlin Fester  Primary Care Physician: Phys Other, MD        Summary:   71  year old male with past history of CAD, MI, PVD, DM, PAD, pneumonia, anemia. Admitted with a diagnosis of PAD, PVD and s/p surgical intervention for left foot, peripheral angioplastyt. Major hospital events include MI. Current issues include pt complains of severe left foot pain. Palliative Medicine consulted for help with pain management, goals of care.     Palliative Diagnoses:   1. Weakness  2. Pain foot, post op surgical pain, neuropathic pain  3. Acute renal failure  4. Chest pain     Plan:   1. Cardiac MRI  2. Please give OxyContin 10mg  Q 12hrs, may be given with the percocet  3. Continue the percocet Q 4hrs prn     Goals of Care:   1. Comfort  2. Safe discharge    Resuscitation Status: Full Code   Health Care Proxy: daughter   Family Meeting Date: will schedule     Thank you for including Palliative Medicine in this patient's care.  Shaune Pollack Sidi Dzikowski, NP           History obtained from:  Pt and chart    Chief Complaint: pain left foot    History of Present Illness:  Pt is s/p peripheral angioplasty of the left foot due to PAD and foot ulcer also with significant CAD and chest discomfort in setting of severe CAD with elevated troponin of 6.44.Marland Kitchen EF 40%. 2. Dyslipidemia, 3. HTN 4. PVD 5. Diabetes 6. Renal insufficiency:   He complains of pain in his foot ongoing 10/10 sharp and shooting, stabbing, has renal failure and is awaiting cardiac MRI to determine if there is a chance for a stent and improvement in his symptoms.            Review of Systems:  The following systems were  reviewed: constitutional, ears/nose/mouth/throat, respiratory, gastrointestinal, musculoskeletal, neurologic, psychiatric, endocrine. Positive findings noted below.  Modified ESAS Completed by: provider     Drowsiness: 0   Depression: 0 Pain: 10   Anxiety: 2 Nausea: 0   Anorexia: 0 Dyspnea: 0   Secretions: No Constipation: No   Unable to Respond: No Delirium: No       FUNCTIONAL ASSESSMENT    Palliative Performance Scale (PPS):  PPS: 70    Functional Assessment Staging (FAST): na                          Confusion Assessment Method Diagnostic Algorithm (CAM):    CAM Score: 0     Spiritual Assessment:  Patient Interventions: Affirmation of emotions/emotional suffering;Bridging;Catharsis/review of pertinent events in supportive environment;Coping skills reviewed/reinforced;Iconic (affirming the presence of God/Higher Power);Life review/legacy     Follow up Date: 08/22/11 ([facility])      Principal Problem:   *Chest pain, unspecified (08/21/2011)  Active Problems:     PAD (peripheral artery disease) (05/18/2011)   PVR tracing 05/18/2011 suggest bilateral SFA occlusion and calf vessel    disease    and high grade stenosis of his proximal fem-pop  Angio March 2012 [facility]): r sfa-ak pop with prox stenosis, 1 vs r/o; left    diffuse sfa , severe tpt disease     Acute on chronic renal failure (08/21/2011)     Pneumonia, organism unspecified (08/21/2011)     Leukocytosis, unspecified (08/21/2011)     DM (diabetes mellitus) (08/21/2011)     Anemia of other chronic disease (08/21/2011)     Other and unspecified hyperlipidemia (08/21/2011)    Past Medical History   Diagnosis Date   ??? CAD (coronary artery disease)      see problem list   ??? HTN (hypertension)    ??? PVD (peripheral vascular disease)    ??? DM (diabetes mellitus)    ??? Hyperlipidemia    ??? Sleep apnea      on CPAP   ??? COPD (chronic obstructive pulmonary disease)      mild COPD, recurrent pneumonia, chronic bronchitis (followed by Dr. Leroy Libman)   ??? CKD (chronic kidney disease)      Cr 1.7  in 10/09   ??? CHF (congestive heart failure)    ??? Anemia    ??? Pulmonary hypertension      PASP 60 on echo 06/22/09   ??? Thromboembolus      leg    ??? Thyroid disease    ??? Pneumonia    ??? GERD (gastroesophageal reflux disease)    ??? Chronic pain      back pain   ??? Cataract    ??? GI bleed      multiple bleeding episodes and blood transfusions.  Dr. Monika Salk has followed..  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   ??? Foot ulcer 07/18/2011      Past Surgical History   Procedure Date   ??? Hx orthopaedic      knee replacement   ??? Pr vascular surgery procedure unlist      arterial bypass   ??? Hx pacemaker    ??? Pr cardiac surg procedure unlist      cardiac stent   ??? Hx cataract removal      removed rt eye      Family History   Problem Relation Age of Onset   ??? Hypertension     ??? Cancer Mother      cervical and colon   ??? Cancer Father      lung      History   Substance Use Topics   ??? Smoking status: Former Smoker -- 20 years     Types: Cigarettes     Quit date: 11/09/1978   ??? Smokeless tobacco: Never Used   ??? Alcohol Use: No     Current Facility-Administered Medications   Medication Dose Route Frequency   ??? insulin glargine (LANTUS) injection 30 Units  30 Units SubCUTAneous DAILY   ??? oxyCODONE CR (OXYCONTIN) tablet 10 mg  10 mg Oral Q12H   ??? epoetin alfa (EPOGEN;PROCRIT) injection 20,000 Units  20,000 Units SubCUTAneous Q7D   ??? insulin lispro (HUMALOG) injection   SubCUTAneous AC&HS   ??? dextrose (D50W) injection Syrg 12.5-25 g  12.5-25 g IntraVENous PRN   ??? ranolazine ER (RANEXA) tablet 500 mg  500 mg Oral DAILY   ??? famotidine (PEPCID) tablet 20 mg  20 mg Oral DAILY   ??? insulin lispro (HUMALOG) injection 20 Units  20 Units SubCUTAneous ONCE   ??? DISCONTD: insulin glargine (LANTUS) injection 25 Units  25 Units SubCUTAneous DAILY   ??? aspirin chewable tablet 81 mg  81 mg Oral DAILY   ???  atorvastatin (LIPITOR) tablet 40 mg  40 mg Oral QHS   ??? clopidogrel (PLAVIX) tablet 75 mg  75 mg Oral DAILY   ??? ferrous sulfate tablet 325 mg  325 mg Oral ACB&D   ??? fish  oil-omega-3 fatty acids 340-1,000 mg capsule 1 Cap  1 Cap Oral BID   ??? isosorbide mononitrate ER (IMDUR) tablet 30 mg  30 mg Oral DAILY   ??? levothyroxine (SYNTHROID) tablet 150 mcg  150 mcg Oral ACB   ??? metoprolol (LOPRESSOR) tablet 100 mg  100 mg Oral BID   ??? misoprostol (CYTOTEC) tablet 200 mcg  200 mcg Oral BID   ??? nitroglycerin (NITROSTAT) tablet 0.4 mg  0.4 mg SubLINGual PRN   ??? oxyCODONE-acetaminophen (PERCOCET 7.5) 7.5-325 mg per tablet 1 Tab  1 Tab Oral Q4H PRN   ??? terazosin (HYTRIN) capsule 10 mg  10 mg Oral QHS   ??? acetaminophen (TYLENOL) tablet 650 mg  650 mg Oral Q4H PRN   ??? ondansetron (ZOFRAN) injection 4 mg  4 mg IntraVENous Q4H PRN   ??? 0.9% sodium chloride infusion  75 mL/hr IntraVENous CONTINUOUS   ??? heparin (porcine) injection 5,000 Units  5,000 Units SubCUTAneous Q8H   ??? pantoprazole (PROTONIX) tablet 40 mg  40 mg Oral ACB   ??? azithromycin (ZITHROMAX) 500 mg in 0.9% sodium chloride 250 mL IVPB  500 mg IntraVENous Q24H   ??? cefTRIAXone (ROCEPHIN) 1 g in 0.9% sodium chloride (MBP/ADV) 50 mL MBP  1 g IntraVENous Q24H   ??? levalbuterol (XOPENEX) nebulizer soln 0.63 mg/3 ml  0.63 mg Nebulization Q4H PRN   ??? glucose chewable tablet 16 g  4 Tab Oral PRN   ??? dextrose (D50W) injection Syrg 12.5-25 g  12.5-25 g IntraVENous PRN   ??? glucagon (GLUCAGEN) injection 1 mg  1 mg IntraMUSCular PRN     No Known Allergies           Physical Exam:    Blood pressure 142/68, pulse 95, temperature 98.4 ??F (36.9 ??C), resp. rate 20, height 5\' 6"  (1.676 m), weight 82.645 kg (182 lb 3.2 oz), SpO2 92.00%.  Pain:  Pain Scale 1: Numeric (0 - 10)  Pain Intensity 1: 0     Pain Location 1: Foot  Pain Orientation 1: Left  Pain Description 1: Aching  Pain Intervention(s) 1: Medication (see MAR)                Constitutional: pt is awake alert verbal and responsive, states there are too many women involved in his care but willing to allow our team to help with pain management, Rates pain in his foot 10/10 as his OxyContin dose was not  given this morning. Held due to percocet?  Eyes: pupils equal, anicteric  ENMT: no nasal discharge, moist mucous membranes  Cardiovascular: regular rhythm, distal pulses intact  Respiratory: breathing not labored, symmetric  Gastrointestinal: soft non-tender, +bowel sounds  Musculoskeletal: ++ left foot with dressing dry and intact,  no tenderness to palpation  Skin: warm, dry  Neurologic: follows commands, moving all extremities  Psychiatric: full affect, no hallucinations  Other:    Lab Data Reviewed:  Lab Results   Component Value Date/Time    WBC 9.8 08/23/2011  6:03 AM    HGB 8.5 08/23/2011  6:03 AM    PLATELET 199 08/23/2011  6:03 AM     Lab Results   Component Value Date/Time    Sodium 138 08/23/2011  6:03 AM    Potassium 4.1 08/23/2011  6:03 AM    Chloride 102 08/23/2011  6:03 AM    CO2 28 08/23/2011  6:03 AM    BUN 44 08/23/2011  6:03 AM    Creatinine 2.01 08/23/2011  6:03 AM    Calcium 7.7 08/23/2011  6:03 AM    Magnesium 2.2 08/23/2011  6:03 AM    Phosphorus 2.7 08/23/2011  6:03 AM      Lab Results   Component Value Date/Time    AST 28 07/18/2011  6:40 PM    ALT 16 07/18/2011  6:40 PM    Alk. phosphatase 88 07/18/2011  6:40 PM    Bilirubin, total 0.7 07/18/2011  6:40 PM    Protein, total 8.4 07/18/2011  6:40 PM    Albumin 2.5 07/24/2011  3:39 AM    Globulin 5.6 07/18/2011  6:40 PM     Lab Results   Component Value Date/Time    INR 1.1 05/20/2011  8:20 AM    Prothrombin time 11.7 05/20/2011  8:20 AM    aPTT 35.2 07/22/2011  4:30 AM      Lab Results   Component Value Date/Time    Iron 66 10/02/2009  1:25 PM    TIBC 188 10/02/2009  1:25 PM    Iron % saturation 35 10/02/2009  1:25 PM    Ferritin 498 10/02/2009  1:25 PM      Lab Results   Component Value Date/Time    pH 7.52 05/31/2009  1:03 AM    PCO2 36 05/31/2009  1:03 AM    PO2 60 05/31/2009  1:03 AM     Lab Results   Component Value Date/Time    POC GLUCOSE 182 08/23/2011 11:19 AM    POC GLUCOSE 148 08/23/2011  7:23 AM    POC GLUCOSE 205 08/23/2011  1:08 AM    POC GLUCOSE 317 08/22/2011  9:46  PM    POC GLUCOSE 422 08/22/2011  4:19 PM      Lab Results   Component Value Date/Time    CK 338 08/23/2011  6:03 AM              Total time: 50  > 50% counseling / coordination?: Y

## 2011-08-23 NOTE — Progress Notes (Addendum)
I know Travis [name] peripherally.  Discussed case with partner who has cared for him   Not really sure what is causing the foot pain  Been going on for about a week.  Foot is warm and well perfused.  I dont think it is ischemic rest pain.  Pain a little better today.  Agree with current management.  He is scheduled to get vascular studies.  Previous ABI about .7  Thank you for letting us know about his admission  Will add some neurontin to see if that helps a bit.

## 2011-08-23 NOTE — Consults (Signed)
Patient well known to me from past interventions. Currently under the care of DR. MUHKERJEE who did his last intervention (SFA/Pop PTA) in December 2012. Patient states he wishes him to continue his care.  Thank you for asking [name] to see him.

## 2011-08-23 NOTE — Progress Notes (Addendum)
Cardiology Progress Note         NAME:  Travis [name]   DOB:   1940-12-28   MRN:   [id number]     Assessment/Plan:   1. Chest discomfort in setting of severe CAD with elevated troponin of 6.44.   No chest pain overnight.  EF 40%.  Disease not amenable to catheter based therapy based on prior evaluation.  Cont medical mgt for CAD  Have added ranexa . Cont plavix, asa, BB, statin. Will plan for the MRI later, once he is recovers from ARF if he is agreeable.  High risk for readmission given his multiple comorbidities/ adv CAD.   2. Dyslipidemia: cont statin   3. HTN   4. PVD   5. Diabetes   6. Renal insufficiency:  Dr Sherrine Maples following: On IVF, off loops and ACE , Creat 2.01       CARDIOLOGY ATTENDING  Patient seen and examined.  Agree with above.  Travis [name] has ruled in for another MI likely due to supply demand mismatch.    Continue CAD for medical management.   Long-term will consider a cardiac MRI for viability followed by PCI of chronic total occlusion by Dr. Carollee Massed.  Will plan for a cardiac MRI either Monday or Tuesday if feasible and patient agreeable.   Tharon Aquas, MD, [facility]                Subjective:   Travis [name] is a 71 y.o. AA male  With a PMH of  diabetes poorly controlled, hypertension, hyperlipidemia, coronary artery disease and OSA and peripheral artery disease. Symptoms include: chest pressure/discomfort Cardiac risk factors: family history, dyslipidemia, diabetes mellitus, obesity, sedentary life style, male gender, hypertension. He presented to the ER after having used NTG for chest pain. Marland Kitchen He has severe CAD based on recent cardiac catheterization. He was recently discharged and was scheduled to see Dr. Welton Flakes as outpatient. He was last admitted with non-ST elevated myocardial infarction . Cath was done, but CAD was not amenable to PCI. Medical management was recommended at that time.   Of note his son recently has been diagnosed with AML and is under treatment. The  patient is under stress regarding this and may have less interest in taking care of himself.        Cardiac ROS: No pain over night.  Troponin up to 6.44.  Patient denies any exertional chest pain, dyspnea, palpitations, syncope, orthopnea, edema or paroxysmal nocturnal dyspnea.  Cardiac cath 2.25.13:  Distal left main: There was a 30 % stenosis.  -- Mid LAD: There was a diffuse 30 % stenosis.  -- Distal LAD: 70% stenosis of apical LAD.  -- Mid circumflex: There was a 100 % stenosis at the site of a prior  stent. This lesion is a chronic total occlusion. -- 1st obtuse marginal:  There was a discrete 80 % stenosis at the ostium of the vessel segment.  -- Proximal RCA: There was a diffuse 80 % stenosis.  -- Mid RCA: There was a 100 % stenosis at the site of a prior stent. This  lesion is a chronic total occlusion.          Review of Systems: + foot pain.  No nausea, indigestion, vomiting, , cough, sputum. No bleeding. Appetite good.     Testing:   RENAL artery duplex1. Mild < 60 % stenosis in the right renal artery. 2. No significant stenosis in the left renal artery. 3. The right  kidney measures 11.9 cm. 4. The left kidney measures 11.5 cm. MESENTERIC: 1. 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          Objective:     BP 156/65   Pulse 98   Temp 98 ??F (36.7 ??C)   Resp 18   Ht 5\' 6"  (1.676 m)   Wt 82.645 kg (182 lb 3.2 oz)   BMI 29.41 kg/m2   SpO2 96% O2 Flow Rate (L/min): 2 l/min O2 Device: Room air    Temp (24hrs), Avg:98.3 ??F (36.8 ??C), Min:98 ??F (36.7 ??C), Max:99 ??F (37.2 ??C)      03/29 0700 - 03/29 1859  In: 1075 [P.O.:240; I.V.:835]  Out: 225 [Urine:225]    03/27 1900 - 03/29 0659  In: 3209.1 [P.O.:600; I.V.:2609.1]  Out: 1275 [Urine:1275]  TELE: a paced, SR,  3 beat NSVT     General: AAOx3 cooperative, no acute distress.  HEENT: Atraumatic. Pink and moist.  Anicteric sclerae.  Neck : Supple, no thyromegaly.   Lungs: CTA bilaterally. No wheezing/rhonchi/rales.  Heart: Regular rhythm, no murmur, no rubs, no  gallops. No JVD. No carotid bruits.   Abdomen: Soft, non-distended, non-tender. + Bowel sounds. No bruits.  Extremities: Trace edema, Dressing L foot.  No edema, no clubbing, no cyanosis. No calf tenderness  Neurologic: Grossly intact.  Alert and oriented X 3.  No acute neurological distress.   Psych: Fair insight. Not anxious        Care Plan discussed with:    Comments   Patient y    Family      RN y    Futures trader                    Consultant:          Data Review:     Recent Labs   Basename 08/21/11 1625    ITNL 0.56*      Recent Labs   Basename 08/23/11 0603 08/22/11 0045 08/21/11 1830    CPK 338* 331* 288    CKMB -- -- --    TROIQ 6.44* 2.21* 1.63*     Recent Labs   Basename 08/23/11 0603 08/22/11 0045 08/21/11 1830 08/21/11 1630    NA 138 136 131* --    K 4.1 4.7 5.3* --    CL 102 102 96* --    CO2 28 28 22  --    BUN 44* 42* 42* --    CREA 2.01* 2.63* 2.30* --    GLU 118* 137* 526* --    PHOS 2.7 -- -- --    MG 2.2 -- 1.9 --    ALB -- -- -- --    WBC 9.8 11.3* -- 13.2*    HGB 8.5* 7.8* -- 9.4*    HCT 26.1* 23.3* -- 28.7*    PLT 199 189 -- 179     No results found for this basename: INR:3,PTP:3,APTT:3, in the last 72 hours    Medications reviewed  Current Facility-Administered Medications   Medication Dose Route Frequency   ??? insulin glargine (LANTUS) injection 30 Units  30 Units SubCUTAneous DAILY   ??? oxyCODONE CR (OXYCONTIN) tablet 10 mg  10 mg Oral Q12H   ??? insulin lispro (HUMALOG) injection   SubCUTAneous AC&HS   ??? dextrose (D50W) injection Syrg 12.5-25 g  12.5-25 g IntraVENous PRN   ??? ranolazine ER (RANEXA) tablet 500 mg  500 mg Oral DAILY   ??? famotidine (PEPCID) tablet 20  mg  20 mg Oral DAILY   ??? insulin lispro (HUMALOG) injection 20 Units  20 Units SubCUTAneous ONCE   ??? aspirin chewable tablet 81 mg  81 mg Oral DAILY   ??? atorvastatin (LIPITOR) tablet 40 mg  40 mg Oral QHS   ??? clopidogrel (PLAVIX) tablet 75 mg  75 mg Oral DAILY   ??? ferrous sulfate tablet 325 mg  325 mg Oral ACB&D   ??? fish oil-omega-3  fatty acids 340-1,000 mg capsule 1 Cap  1 Cap Oral BID   ??? isosorbide mononitrate ER (IMDUR) tablet 30 mg  30 mg Oral DAILY   ??? levothyroxine (SYNTHROID) tablet 150 mcg  150 mcg Oral ACB   ??? metoprolol (LOPRESSOR) tablet 100 mg  100 mg Oral BID   ??? misoprostol (CYTOTEC) tablet 200 mcg  200 mcg Oral BID   ??? nitroglycerin (NITROSTAT) tablet 0.4 mg  0.4 mg SubLINGual PRN   ??? oxyCODONE-acetaminophen (PERCOCET 7.5) 7.5-325 mg per tablet 1 Tab  1 Tab Oral Q4H PRN   ??? terazosin (HYTRIN) capsule 10 mg  10 mg Oral QHS   ??? acetaminophen (TYLENOL) tablet 650 mg  650 mg Oral Q4H PRN   ??? ondansetron (ZOFRAN) injection 4 mg  4 mg IntraVENous Q4H PRN   ??? 0.9% sodium chloride infusion  75 mL/hr IntraVENous CONTINUOUS   ??? heparin (porcine) injection 5,000 Units  5,000 Units SubCUTAneous Q8H   ??? pantoprazole (PROTONIX) tablet 40 mg  40 mg Oral ACB   ??? azithromycin (ZITHROMAX) 500 mg in 0.9% sodium chloride 250 mL IVPB  500 mg IntraVENous Q24H   ??? cefTRIAXone (ROCEPHIN) 1 g in 0.9% sodium chloride (MBP/ADV) 50 mL MBP  1 g IntraVENous Q24H   ??? levalbuterol (XOPENEX) nebulizer soln 0.63 mg/3 ml  0.63 mg Nebulization Q4H PRN   ??? glucose chewable tablet 16 g  4 Tab Oral PRN   ??? dextrose (D50W) injection Syrg 12.5-25 g  12.5-25 g IntraVENous PRN   ??? glucagon (GLUCAGEN) injection 1 mg  1 mg IntraMUSCular PRN         Stephanie A. Vilma Prader, NP

## 2011-08-23 NOTE — Progress Notes (Addendum)
Location: 3TELE1 - 32001  Attn.: Orpah Clinton  DOB: 10/18/1940 / Age: 71  MR#: [id number] / Admit#: [id number]  Pt. First Name: Strummer  Pt. Last Name: Mellody Memos [facility]  [address] [place], [place]  [address]                        Case Management - Progress Note  Initial Open Date: 08/21/2011  Case Manager:    Initial Open Date:  Social Worker: Sharyne Peach  Expected Date of Discharge:  Transferred From:  ECF Bed Held Until:  Bed Held By:  Power of Attorney:  POA/Guardian/Conservator Capacity:  Primary Caregiver:  Living Arrangements:  Source of Income:  Payee:  Psychosocial History:  Cultural/Religious/Language Issues:  Education Level:  ADLS/Current Living Arrangements Issues:  Past Providers: [facility] per [place] @ [phone]  Will patient perform self care at discharge?  Anticipated Discharge Disposition Goal:  Assessment/Plan:   08/23/2011 02:14P  Spoke to pt's daughter [name], consult noted for discharge planning. She  wanted to know if her father was a candidate for inpatient rehab. PT and OT  have not seen the patient, explained to her that he would need to have  those evals before rehab could evaluate him. She would like for him to go  to [facility]. If not a candidate for rehab, then interested in  a SNF. She would like [facility] or [facility] Penta have sent those referrals  in [place].  Thanks Ames Dura, MSW        08/23/2011 12:08P  Met with pt.  Intrduced self.  Confirmed primary contact is daugher, Sunday Shams, [phone].   No other issues/concerns noted.  Thanks.  Cyndy Freeze, LCSW    08/21/11 CARE MANAGEMENT NOTE: Initially, pt arrived at the ER and declined  treatment - he later agreed to be examined. Per chart, pt was hospitalized  at [facility]. [name] from 05/16/11 - 05/25/11 with NSTEMI. Upon d/c he was set-up  with [facility] agency. He received a rolling walker for home use thru  [facility] [phone]), and home oxygen thru  [facility]. Pt will  be admitted to the hospital with cc: chest pain and further medical  management. Full assessment not completed at this time. CM will continue to  follow pt's hospital course and will assist as needed.  D.Wegner      Resources at Discharge:  Service Providers at Discharge:  Dictating Provider:  Alferd Apa

## 2011-08-23 NOTE — Other (Cosign Needed Addendum)
[place] Cardiac Heart Failure/AMI Team    08/23/2011  Attendance:   __~_ NP/MD   ___~ Pharmacist   _~__ Dietician ~___ Care manager  ___ PT/OT   _~__Clinical lead  ___~ Primary RN  ___Palliative care/hospice     ___ _~___ cardiac rehab   ________ other _________     Patient Information:  NAME:   Travis [name]         AGE:   71 y.o.        ADMISSION DATE:   08/21/2011                                                       LOS:         ADMITTING PHYSICIAN:   Marijo File, MD           *CARDIOLOGIST:     Dr. Laddie Aquas / Dr. Doreene Adas                                                          PCP:  Phys Other, MD                                                      CODE STATUS:  Full                                                       ADVANCED DIRECTIVES:   N                                                       __________________________________________________________________  __________________________________________________________________   Reason for discussing patient in Cardiac Rounds:       NSTEMI with heart failure  by hx Travis [name] is a 71 y.o. male I am seeing for diabetes poorly controlled, hypertension, hyperlipidemia, coronary artery disease and OSA and peripheral artery disease. Symptoms include: chest pressure/discomfort Cardiac risk factors: family history, dyslipidemia, diabetes mellitus, obesity, sedentary life style, male gender, hypertension. This gentleman who is a patient of Dr. Rayann Heman is returning to the hospital after having chest pain and increase use of NTG. He has severe CAD based on recent cardiac catheterization.   He was recnetly discharged and was scheduled to see Dr. Welton Flakes as outpatient. He was last admitted with non-ST elevated myocardial infarction . Cath was done, but CAD was not amenable to PCI. Medical management was recommended at that time.   Of note his son recently has been diagnosed with AML and is under treatment. The patient is under stress regarding this  and may have less interest in taking care of himself.  Admit Weight:  182#   Daily weights:  3.27 182#    Prior Hospital Admissions  Patient Been Admitted to the Hospital Prior to this Encounter: Yes (greater than 30 days)  Name of the Hospital(s): [facility]  Why Were You in the Hospital?: dm,pneumonia  Specialist Care: Yes (comment)  Readmit Risk Tool  Scheduled Admission: No  Living Alone: Yes   Caregivers/Community Support: Yes  History of Mental Illness: No  Polypharmacy (Greater Than 7 Home Meds): Yes  Currently Prescribed : Insulin  Requires Financial, Physical and/or Educational Assistance With Medications: No  Independent with ADLs: Yes  Needs Assistance with Wound Care AND/OR Mgnt of O2, Nebulizer: Yes  Clinically Complex: No  History of Falls Within Past 3 Months: Yes  Chronic Medical Condition: Yes  Patient Directs Own Care: Yes  Readmit RISK Tool Score: 12         Action plan/Patient / Family Goals For Today: 08/23/2011   IVF due to renal function.  Consider cardiac MRI once renal function . Will need sedation.   08/26/2011  NonSTEMI : EF 40%. Cont medical mgt for CAD although PCI CTO remains a consideration. Continue ranexa, plavix, asa, BB, statin. Will plan for the MRI later, once he is recovered from ARF.   2. Dyslipidemia: cont statin   3. HTN   4. abetes : hypoglycemia this am , lantus decreased   5 Renal insufficiency: Worse today, unclear level of CKD. ARF likely volume related. Worse today. Restart low dose IVF. Holding diuretics. Renal is following. Monitor BMP   6. PVD/leg pain : hx of bypass in legs. No ischemia per Vascular. Dopplers today. If negative resume PT   7. Depressed   8. Deconditioned: PT if venous US negative   08/27/2011  Renal function worse, CT head done yesterday due to confusion, negative, narcs reduced with some improvement, Needs cardiac MRI but unable with current renal function :  Renal following.   08/28/2011  NonSTEMI : EF 40%.Remains pain free. Cont medical mgt  for CAD although PCI CTO remains a consideration. Continue ranexa, plavix, asa, BB, Imdur, statin. No plans for MRI until renal function improves significantly. Acute renal failure with continually rising Cr is a contraindication for Gd administration.   2. Dyslipidemia: cont statin   3. HTN   4. diabetes :per attending   5 Renal insufficiency: creat 2.72 this am. Unclear level of CKD. Holding diuretics. Renal is following. Monitor BMP. Follow up arranged at [facility]. Will follow peripherally.         _________________________________________________________________   __________________________________________________________________    EF:   Left ventricle: There was moderate hypokinesis of the basal-mid  inferoseptal and basal-mid inferior wall(s). Features were consistent with  a pseudonormal left ventricular filling pattern, with concomitant abnormal  relaxation and increased filling pressure (grade 2 diastolic dysfunction).    Echo 2.23.2013  ____________________________________________________    Cardiac Surgery or Procedure:     Cardiac cath 2.25.2013  -- CORONARY CIRCULATION:  -- Distal left main: There was a 30 % stenosis.  -- Mid LAD: There was a diffuse 30 % stenosis.  -- Distal LAD: 70% stenosis of apical LAD.  -- Mid circumflex: There was a 100 % stenosis at the site of a prior  stent. This lesion is a chronic total occlusion. -- 1st obtuse marginal:  There was a discrete 80 % stenosis at the ostium of the vessel segment.  -- Proximal RCA: There was a diffuse 80 % stenosis.  -- Mid RCA:  There was a 100 % stenosis at the site of a prior stent. This  lesion is a chronic total occlusion.      ___________________________________________________________________  ___________________________________________________________________    CORE MEASURE MEDICATIONS : (include dosage with meds as indicated below)  Current Facility-Administered Medications   Medication Dose Route Frequency   ??? insulin glargine  (LANTUS) injection 30 Units  30 Units SubCUTAneous DAILY   ??? oxyCODONE CR (OXYCONTIN) tablet 10 mg  10 mg Oral Q12H   ??? insulin lispro (HUMALOG) injection   SubCUTAneous AC&HS   ??? dextrose (D50W) injection Syrg 12.5-25 g  12.5-25 g IntraVENous PRN   ??? ranolazine ER (RANEXA) tablet 500 mg  500 mg Oral DAILY   ??? famotidine (PEPCID) tablet 20 mg  20 mg Oral DAILY   ??? insulin lispro (HUMALOG) injection 20 Units  20 Units SubCUTAneous ONCE   ??? aspirin chewable tablet 81 mg  81 mg Oral DAILY   ??? atorvastatin (LIPITOR) tablet 40 mg  40 mg Oral QHS   ??? clopidogrel (PLAVIX) tablet 75 mg  75 mg Oral DAILY   ??? ferrous sulfate tablet 325 mg  325 mg Oral ACB&D   ??? fish oil-omega-3 fatty acids 340-1,000 mg capsule 1 Cap  1 Cap Oral BID   ??? isosorbide mononitrate ER (IMDUR) tablet 30 mg  30 mg Oral DAILY   ??? levothyroxine (SYNTHROID) tablet 150 mcg  150 mcg Oral ACB   ??? metoprolol (LOPRESSOR) tablet 100 mg  100 mg Oral BID   ??? misoprostol (CYTOTEC) tablet 200 mcg  200 mcg Oral BID   ??? nitroglycerin (NITROSTAT) tablet 0.4 mg  0.4 mg SubLINGual PRN   ??? oxyCODONE-acetaminophen (PERCOCET 7.5) 7.5-325 mg per tablet 1 Tab  1 Tab Oral Q4H PRN   ??? terazosin (HYTRIN) capsule 10 mg  10 mg Oral QHS   ??? acetaminophen (TYLENOL) tablet 650 mg  650 mg Oral Q4H PRN   ??? ondansetron (ZOFRAN) injection 4 mg  4 mg IntraVENous Q4H PRN   ??? 0.9% sodium chloride infusion  75 mL/hr IntraVENous CONTINUOUS   ??? heparin (porcine) injection 5,000 Units  5,000 Units SubCUTAneous Q8H   ??? pantoprazole (PROTONIX) tablet 40 mg  40 mg Oral ACB   ??? azithromycin (ZITHROMAX) 500 mg in 0.9% sodium chloride 250 mL IVPB  500 mg IntraVENous Q24H   ??? cefTRIAXone (ROCEPHIN) 1 g in 0.9% sodium chloride (MBP/ADV) 50 mL MBP  1 g IntraVENous Q24H   ??? levalbuterol (XOPENEX) nebulizer soln 0.63 mg/3 ml  0.63 mg Nebulization Q4H PRN   ??? glucose chewable tablet 16 g  4 Tab Oral PRN   ??? dextrose (D50W) injection Syrg 12.5-25 g  12.5-25 g IntraVENous PRN   ??? glucagon (GLUCAGEN)  injection 1 mg  1 mg IntraMUSCular PRN       *ACE/ARB if EF <40%:   ACE- I on hold due to renal function                                       If none, has MD documented why?        *Beta Blocker :    lopressor 100 mg BID                            If none, has MD documented why?        ASA :  81 mg qd     *ASA on arrival ( if AMI ) :                                              If AMI and none given,has MD documented why?          Statin :  lipitor 40 mg QHS         If AMI and none ordered, has MD documented why?               Lipids:   ___________________________________________________________________  ___________________________________________________________________     Vaccination Information:      Immunization History   Administered Date(s) Administered   ??? Pneumococcal Vaccine 05/23/2011                                    ___________________________________________________________________  ___________________________________________________________________   DVT Px:   Y      Type:   Heparin 5000 u Q 8 hours       Discharge Planning:      a) Pt Admitted From:   home       b) Health care partner:  Children                                                     C) Discharge to:                     SNF                                (E) Does patient have a scales at home?   ________________________________________________________  ______________________________________________________________________     History of Smoking within the past year?:     n/a                   Smoking Cessation Education Provided and documented: n/a          ______________________________________________________________________

## 2011-08-23 NOTE — Progress Notes (Addendum)
[facility] Progress Note  [place] [facility]   [address] [place], [place] [address]   [phone]      Assessment & Plan:   ARF/CKD  ?? SCr improving  ?? Low FeNa  ?? Renal duplex with subcritical unilateral lesion  ?? Perhaps he has been on the dry side  ?? Continue to hold diuretics and watch volume    HTN  ?? Stable    CP/CAD  ?? Medical mgmt    Anemia  ?? EPO + PO Fe       Subjective:   CC: f/u ARF, CKD  HPI: ARF is improving.   BJY:NWGNFA SOB or CP  Current Facility-Administered Medications   Medication Dose Route Frequency   ??? insulin glargine (LANTUS) injection 30 Units  30 Units SubCUTAneous DAILY   ??? oxyCODONE CR (OXYCONTIN) tablet 10 mg  10 mg Oral Q12H   ??? insulin lispro (HUMALOG) injection   SubCUTAneous AC&HS   ??? dextrose (D50W) injection Syrg 12.5-25 g  12.5-25 g IntraVENous PRN   ??? ranolazine ER (RANEXA) tablet 500 mg  500 mg Oral DAILY   ??? famotidine (PEPCID) tablet 20 mg  20 mg Oral DAILY   ??? insulin lispro (HUMALOG) injection 20 Units  20 Units SubCUTAneous ONCE   ??? aspirin chewable tablet 81 mg  81 mg Oral DAILY   ??? atorvastatin (LIPITOR) tablet 40 mg  40 mg Oral QHS   ??? clopidogrel (PLAVIX) tablet 75 mg  75 mg Oral DAILY   ??? ferrous sulfate tablet 325 mg  325 mg Oral ACB&D   ??? fish oil-omega-3 fatty acids 340-1,000 mg capsule 1 Cap  1 Cap Oral BID   ??? isosorbide mononitrate ER (IMDUR) tablet 30 mg  30 mg Oral DAILY   ??? levothyroxine (SYNTHROID) tablet 150 mcg  150 mcg Oral ACB   ??? metoprolol (LOPRESSOR) tablet 100 mg  100 mg Oral BID   ??? misoprostol (CYTOTEC) tablet 200 mcg  200 mcg Oral BID   ??? nitroglycerin (NITROSTAT) tablet 0.4 mg  0.4 mg SubLINGual PRN   ??? oxyCODONE-acetaminophen (PERCOCET 7.5) 7.5-325 mg per tablet 1 Tab  1 Tab Oral Q4H PRN   ??? terazosin (HYTRIN) capsule 10 mg  10 mg Oral QHS   ??? acetaminophen (TYLENOL) tablet 650 mg  650 mg Oral Q4H PRN   ??? ondansetron (ZOFRAN) injection 4 mg  4 mg IntraVENous Q4H PRN   ??? 0.9% sodium  chloride infusion  75 mL/hr IntraVENous CONTINUOUS   ??? heparin (porcine) injection 5,000 Units  5,000 Units SubCUTAneous Q8H   ??? pantoprazole (PROTONIX) tablet 40 mg  40 mg Oral ACB   ??? azithromycin (ZITHROMAX) 500 mg in 0.9% sodium chloride 250 mL IVPB  500 mg IntraVENous Q24H   ??? cefTRIAXone (ROCEPHIN) 1 g in 0.9% sodium chloride (MBP/ADV) 50 mL MBP  1 g IntraVENous Q24H   ??? levalbuterol (XOPENEX) nebulizer soln 0.63 mg/3 ml  0.63 mg Nebulization Q4H PRN   ??? glucose chewable tablet 16 g  4 Tab Oral PRN   ??? dextrose (D50W) injection Syrg 12.5-25 g  12.5-25 g IntraVENous PRN   ??? glucagon (GLUCAGEN) injection 1 mg  1 mg IntraMUSCular PRN          Objective:     Vitals:  Blood pressure 106/51, pulse 80, temperature 97.4 ??F (36.3 ??C), resp. rate 18, height 5\' 6"  (1.676 m), weight 82.645 kg (182 lb 3.2 oz), SpO2 98.00%.  Temp (24hrs), Avg:98.1 ??F (  36.7 ??C), Min:97.4 ??F (36.3 ??C), Max:99 ??F (37.2 ??C)      Intake and Output:  03/29 0700 - 03/29 1859  In: 1208.8 [P.O.:240; I.V.:968.8]  Out: 525 [Urine:525]  03/27 1900 - 03/29 0659  In: 3209.1 [P.O.:600; I.V.:2609.1]  Out: 1275 [Urine:1275]    Physical Exam:               GENERAL ASSESSMENT: NAD  CHEST: CTA  HEART: S1S2  ABDOMEN: Soft,NT, +BS  EXTREMITY: no EDEMA          ECG/rhythm:    Data Review      Recent Labs   Basename 08/21/11 1625    ITNL 0.56*      Recent Labs   Basename 08/23/11 0603 08/22/11 0045 08/21/11 1830    CPK 338* 331* 288    CKMB -- -- --    TROIQ 6.44* 2.21* 1.63*     Recent Labs   Basename 08/23/11 0603 08/22/11 0045 08/21/11 1830 08/21/11 1630    NA 138 136 131* --    K 4.1 4.7 5.3* --    CL 102 102 96* --    CO2 28 28 22  --    BUN 44* 42* 42* --    CREA 2.01* 2.63* 2.30* --    GLU 118* 137* 526* --    PHOS 2.7 -- -- --    MG 2.2 -- 1.9 --    CA 7.7* 7.6* 7.9* --    ALB -- -- -- --    WBC 9.8 11.3* -- 13.2*    HGB 8.5* 7.8* -- 9.4*    HCT 26.1* 23.3* -- 28.7*    PLT 199 189 -- 179      No results found for this basename: INR:3,PTP:3,APTT:3, in the  last 72 hours  Needs: urine analysis, urine sodium, protein and creatinine  Lab Results   Component Value Date/Time    Sodium,urine random 23 08/22/2011  3:18 PM    Creatinine,urine random 177.20 08/22/2011  3:18 PM                 [facility]  [address]  [place], [place] [address]  Phone - [phone]  Fax - [phone]

## 2011-08-23 NOTE — Progress Notes (Signed)
Bedside and Verbal shift change report given to J. Clarke,RN (oncoming nurse) by S. Qaadir Kent,RN (offgoing nurse).  Report given with SBAR, Kardex, Intake/Output, MAR and Recent Results.

## 2011-08-23 NOTE — Progress Notes (Addendum)
Palliative Medicine Social Work Note    08/23/2011    Palliative NP Chyrl Civatte and SW made visit to pt; no family present.  Pt was up in chair, was initially somewhat irritable but mood softened as visit progressed.  Some forgetfulness noted in conversation.  Pt reports recent foot surgery (partial toe amputation), denied pain directly following surgery but reports sudden onset several days later, described pain as "shooting."  Pt also reported cardiac issues, expressed concern re: possible surgery.  Pt reports claustrophobia, stated he will need sedation during MRI.  Expressed concern re: risks of surgery, stated he knows several people who have not survived but stated, "if it's your time, it's your time."  Pt was very appreciative of physician sitting down with him and explaining the procedure; discussed benefits of having family present for future conversations due to observed forgetfulness.  Pt lives alone, stated dtr fills his pill box every two weeks.  Care Manager Lanora Manis spoke with dtr [name] by phone re: d/c plans and concerns about pt returning home alone, discussed possible rehab following hospitalization.  NP Chyrl Civatte discussed pt's care with cardiac NP Judeth Cornfield, as well.  SW will follow with supportive visits.  Thank you for including Palliative Medicine in the care of Mr. Lynk.    [address] [place], [place]  [phone]

## 2011-08-23 NOTE — Progress Notes (Signed)
Orders noted for PT to issue rocker bottom off loading shoe for left foot pain. Upon entering patient's room this evening the patient reported he walks at home with a cane with independence. He reports he  was here recently and a rolling walker was ordered for him to go home with. The patient does not want the PT to leave a RW in his room because he had to pay privately for the rolling walker that was last issued $103.00 because Medicare had already paid for RW years earlier. Talked with patient and showed patient wedge off loading shoe. Patient stated he believes he may already have one. Asked patient to contact family who can look at his home and determine if he has one already. Informed patient that PT will follow up tomorrow.

## 2011-08-24 LAB — METABOLIC PANEL, BASIC
Anion gap: 8 mmol/L (ref 5–15)
BUN/Creatinine ratio: 22 — ABNORMAL HIGH (ref 12–20)
BUN: 36 MG/DL — ABNORMAL HIGH (ref 6–20)
CO2: 28 MMOL/L (ref 21–32)
Calcium: 7.6 MG/DL — ABNORMAL LOW (ref 8.5–10.1)
Chloride: 103 MMOL/L (ref 97–108)
Creatinine: 1.62 MG/DL — ABNORMAL HIGH (ref 0.45–1.15)
GFR est AA: 51 mL/min/{1.73_m2} — ABNORMAL LOW (ref >60)
GFR est non-AA: 42 mL/min/{1.73_m2} — ABNORMAL LOW (ref >60)
Glucose: 59 MG/DL — ABNORMAL LOW (ref 65–100)
Potassium: 3.7 MMOL/L (ref 3.5–5.1)
Sodium: 139 MMOL/L (ref 136–145)

## 2011-08-24 LAB — GLUCOSE, POC
Glucose (POC): 206 mg/dL — ABNORMAL HIGH (ref 75–110)
Glucose (POC): 103 mg/dL (ref 75–110)
Glucose (POC): 59 mg/dL — ABNORMAL LOW (ref 75–110)
Glucose (POC): 97 mg/dL (ref 75–110)
Glucose (POC): 254 mg/dL — ABNORMAL HIGH (ref 75–110)

## 2011-08-24 MED ORDER — PIPERACILLIN-TAZOBACTAM 3.375 GRAM IV SOLR
3.375 gram | Freq: Three times a day (TID) | INTRAVENOUS | Status: DC
Start: 2011-08-24 — End: 2011-08-25
  Administered 2011-08-24 – 2011-08-25 (×4): via INTRAVENOUS

## 2011-08-24 MED ORDER — RANOLAZINE SR 500 MG 12 HR TAB
500 mg | Freq: Two times a day (BID) | ORAL | Status: DC
Start: 2011-08-24 — End: 2011-09-03
  Administered 2011-08-24 – 2011-09-03 (×20): via ORAL

## 2011-08-24 MED ORDER — SODIUM CHLORIDE 0.9 % IV PIGGY BACK
2.25 gram | Freq: Four times a day (QID) | INTRAVENOUS | Status: DC
Start: 2011-08-24 — End: 2011-08-24

## 2011-08-24 MED FILL — INSULIN LISPRO 100 UNIT/ML INJECTION: 100 unit/mL | SUBCUTANEOUS | Qty: 1

## 2011-08-24 MED FILL — LIPITOR 20 MG TABLET: 20 mg | ORAL | Qty: 2

## 2011-08-24 MED FILL — ISOSORBIDE MONONITRATE SR 30 MG 24 HR TAB: 30 mg | ORAL | Qty: 1

## 2011-08-24 MED FILL — METOPROLOL TARTRATE 50 MG TAB: 50 mg | ORAL | Qty: 2

## 2011-08-24 MED FILL — FAMOTIDINE 20 MG TAB: 20 mg | ORAL | Qty: 1

## 2011-08-24 MED FILL — ZITHROMAX 500 MG INTRAVENOUS SOLUTION: 500 mg | INTRAVENOUS | Qty: 5

## 2011-08-24 MED FILL — GABAPENTIN 300 MG CAP: 300 mg | ORAL | Qty: 1

## 2011-08-24 MED FILL — OXYCONTIN 10 MG TABLET,EXTENDED RELEASE: 10 mg | ORAL | Qty: 1

## 2011-08-24 MED FILL — SODIUM CHLORIDE 0.9 % IV PIGGY BACK: INTRAVENOUS | Qty: 100

## 2011-08-24 MED FILL — PLAVIX 75 MG TABLET: 75 mg | ORAL | Qty: 1

## 2011-08-24 MED FILL — PANTOPRAZOLE 40 MG TAB, DELAYED RELEASE: 40 mg | ORAL | Qty: 1

## 2011-08-24 MED FILL — FISH OIL 340 MG-1,000 MG CAPSULE: 340-1000 mg | ORAL | Qty: 1

## 2011-08-24 MED FILL — FERROUS SULFATE 325 MG (65 MG ELEMENTAL IRON) TAB: 325 mg (65 mg iron) | ORAL | Qty: 1

## 2011-08-24 MED FILL — LEVOTHYROXINE 150 MCG TAB: 150 mcg | ORAL | Qty: 1

## 2011-08-24 MED FILL — MISOPROSTOL 200 MCG TAB: 200 mcg | ORAL | Qty: 1

## 2011-08-24 MED FILL — HEPARIN (PORCINE) 5,000 UNIT/ML IJ SOLN: 5000 unit/mL | INTRAMUSCULAR | Qty: 1

## 2011-08-24 MED FILL — ASPIRIN 81 MG CHEWABLE TAB: 81 mg | ORAL | Qty: 1

## 2011-08-24 MED FILL — ACETAMINOPHEN 325 MG TABLET: 325 mg | ORAL | Qty: 2

## 2011-08-24 MED FILL — INSULIN GLARGINE 100 UNIT/ML INJECTION: 100 unit/mL | SUBCUTANEOUS | Qty: 0.3

## 2011-08-24 MED FILL — TERAZOSIN 5 MG CAP: 5 mg | ORAL | Qty: 2

## 2011-08-24 MED FILL — RANEXA 500 MG TABLET,EXTENDED RELEASE: 500 mg | ORAL | Qty: 1

## 2011-08-24 MED FILL — DEXTROSE 50% IN WATER (D50W) IV SYRG: INTRAVENOUS | Qty: 50

## 2011-08-24 NOTE — Progress Notes (Addendum)
[facility]  [address] [place], [place] [address]  [phone]      Medical Progress Note      NAME: Travis [name]   DOB:  1940/07/25  MRM:  [id number]    Date/Time: 08/24/2011  8:05 AM          Subjective:     Chief Complaint:  Chest pain: no further, intermittent, moderate    ROS:  (bold if positive, if negative)                       Tolerating Diet          Objective:       Vitals:          Last 24hrs VS reviewed since prior progress note. Most recent are:    Visit Vitals   Item Reading   ??? BP 140/65   ??? Pulse 84   ??? Temp 98.5 ??F (36.9 ??C)   ??? Resp 22   ??? Ht 5\' 6"  (1.676 m)   ??? Wt 182 lb 3.2 oz   ??? BMI 29.41 kg/m2   ??? SpO2 95%     SpO2 Readings from Last 6 Encounters:   08/24/11 95%   08/13/11 99%   08/13/11 99%   07/24/11 98%   05/25/11 97%   11/13/09 98%    O2 Flow Rate (L/min): 2 l/min       Intake/Output Summary (Last 24 hours) at 08/24/11 0805  Last data filed at 08/24/11 0748   Gross per 24 hour   Intake 1942.5 ml   Output    750 ml   Net 1192.5 ml          Exam:     Physical Exam:    Gen:  Well-developed, obese, in no acute distress  HEENT:  Pink conjunctivae, PERRL, hearing intact to voice, moist mucous membranes  Neck:  Supple, without masses, thyroid non-tender  Resp:  No accessory muscle use, clear breath sounds without wheezes rales or rhonchi  Card:  No murmurs, normal S1, S2 without thrills, bruits or peripheral edema  Abd:  Soft, non-tender, non-distended, normoactive bowel sounds are present, no palpable organomegaly and no detectable hernias  Lymph:  No cervical or inguinal adenopathy  Musc:  No cyanosis or clubbing  Skin:  No rashes or ulcers, skin turgor is good  Neuro:  Cranial nerves are grossly intact, no focal motor weakness, follows commands appropriately  Psych:  Good insight, oriented to person, place and time, alert       Telemetry reviewed:   normal sinus rhythm    Medications Reviewed: (see below)    Lab Data Reviewed: (see below)     ______________________________________________________________________    Medications:     Current Facility-Administered Medications   Medication Dose Route Frequency   ??? insulin glargine (LANTUS) injection 30 Units  30 Units SubCUTAneous DAILY   ??? oxyCODONE CR (OXYCONTIN) tablet 10 mg  10 mg Oral Q12H   ??? epoetin alfa (EPOGEN;PROCRIT) injection 20,000 Units  20,000 Units SubCUTAneous Q7D   ??? gabapentin (NEURONTIN) capsule 300 mg  300 mg Oral TID   ??? insulin lispro (HUMALOG) injection   SubCUTAneous AC&HS   ??? dextrose (D50W) injection Syrg 12.5-25 g  12.5-25 g IntraVENous PRN   ??? ranolazine ER (RANEXA) tablet 500 mg  500 mg Oral DAILY   ??? famotidine (PEPCID) tablet 20 mg  20 mg Oral DAILY   ???  aspirin chewable tablet 81 mg  81 mg Oral DAILY   ??? atorvastatin (LIPITOR) tablet 40 mg  40 mg Oral QHS   ??? clopidogrel (PLAVIX) tablet 75 mg  75 mg Oral DAILY   ??? ferrous sulfate tablet 325 mg  325 mg Oral ACB&D   ??? fish oil-omega-3 fatty acids 340-1,000 mg capsule 1 Cap  1 Cap Oral BID   ??? isosorbide mononitrate ER (IMDUR) tablet 30 mg  30 mg Oral DAILY   ??? levothyroxine (SYNTHROID) tablet 150 mcg  150 mcg Oral ACB   ??? metoprolol (LOPRESSOR) tablet 100 mg  100 mg Oral BID   ??? misoprostol (CYTOTEC) tablet 200 mcg  200 mcg Oral BID   ??? nitroglycerin (NITROSTAT) tablet 0.4 mg  0.4 mg SubLINGual PRN   ??? oxyCODONE-acetaminophen (PERCOCET 7.5) 7.5-325 mg per tablet 1 Tab  1 Tab Oral Q4H PRN   ??? terazosin (HYTRIN) capsule 10 mg  10 mg Oral QHS   ??? acetaminophen (TYLENOL) tablet 650 mg  650 mg Oral Q4H PRN   ??? ondansetron (ZOFRAN) injection 4 mg  4 mg IntraVENous Q4H PRN   ??? 0.9% sodium chloride infusion  75 mL/hr IntraVENous CONTINUOUS   ??? heparin (porcine) injection 5,000 Units  5,000 Units SubCUTAneous Q8H   ??? pantoprazole (PROTONIX) tablet 40 mg  40 mg Oral ACB   ??? azithromycin (ZITHROMAX) 500 mg in 0.9% sodium chloride 250 mL IVPB  500 mg IntraVENous Q24H   ??? cefTRIAXone (ROCEPHIN) 1 g in 0.9% sodium chloride (MBP/ADV) 50 mL  MBP  1 g IntraVENous Q24H   ??? levalbuterol (XOPENEX) nebulizer soln 0.63 mg/3 ml  0.63 mg Nebulization Q4H PRN   ??? glucose chewable tablet 16 g  4 Tab Oral PRN   ??? dextrose (D50W) injection Syrg 12.5-25 g  12.5-25 g IntraVENous PRN   ??? glucagon (GLUCAGEN) injection 1 mg  1 mg IntraMUSCular PRN            Lab Review:     Recent Labs   Basename 08/23/11 0603 08/22/11 0045 08/21/11 1630    WBC 9.8 11.3* 13.2*    HGB 8.5* 7.8* 9.4*    HCT 26.1* 23.3* 28.7*    PLT 199 189 179     Recent Labs   Basename 08/24/11 0530 08/23/11 0603 08/22/11 0045 08/21/11 1830    NA 139 138 136 --    K 3.7 4.1 4.7 --    CL 103 102 102 --    CO2 28 28 28  --    GLU 59* 118* 137* --    BUN 36* 44* 42* --    CREA 1.62* 2.01* 2.63* --    CA 7.6* 7.7* 7.6* --    MG -- 2.2 -- 1.9    PHOS -- 2.7 -- --    ALB -- -- -- --    TBIL -- -- -- --    SGOT -- -- -- --    INR -- -- -- --     Lab Results   Component Value Date/Time    POC GLUCOSE 103 08/24/2011  7:46 AM    POC GLUCOSE 206 08/23/2011  8:40 PM    POC GLUCOSE 142 08/23/2011  4:19 PM    POC GLUCOSE 182 08/23/2011 11:19 AM    POC GLUCOSE 148 08/23/2011  7:23 AM     No results found for this basename: PH:3,PCO2:3,PO2:3,HCO3:3,FIO2:3 in the last 72 hours  No results found for this basename: INR:3 in the last 72 hours  Lab Results   Component Value Date/Time    Specimen Description: BLOOD 08/21/2011  6:35 PM    Specimen Description: NARES 09/29/2009 11:45 PM    Specimen Description: NARES 08/08/2009 10:50 PM     Lab Results   Component Value Date/Time    Culture result: NO GROWTH 2 DAYS 08/21/2011  6:35 PM    Culture result:  Value: MRSA NOT PRESENT     Screening of patient nares for MRSA is for surveillance purposes and, if positive, to facilitate isolation considerations in high risk settings. It is not intended for automatic decolonization interventions per se as regimens are not sufficiently effective to warrant routine use. 09/29/2009 11:45 PM    Culture result:  Value: MRSA NOT PRESENT     Screening of  patient nares for MRSA is for surveillance purposes and, if positive, to facilitate isolation considerations in high risk settings. It is not intended for automatic decolonization interventions per se as regimens are not sufficiently effective to warrant routine use. 08/08/2009 10:50 PM            Assessment:     Principal Problem:   *Chest pain, unspecified (08/21/2011)  Active Problems:     PAD (peripheral artery disease) (05/18/2011)   PVR tracing 05/18/2011 suggest bilateral SFA occlusion and calf vessel    disease    and high grade stenosis of his proximal fem-pop   Angio March 2012 ([facility]): r sfa-ak pop with prox stenosis, 1 vs r/o; left    diffuse sfa , severe tpt disease     Acute on chronic renal failure (08/21/2011)     Pneumonia, organism unspecified (08/21/2011)     Leukocytosis, unspecified (08/21/2011)     DM (diabetes mellitus) (08/21/2011)     Anemia of other chronic disease (08/21/2011)     Other and unspecified hyperlipidemia (08/21/2011)     Fever, unspecified (08/24/2011)         Plan:     Principal Problem:   *Chest pain, unspecified (08/21/2011)   - NSTEMI POA per Cardiology   - medical management   - cardiac MRI next week for viability then possible revascularization     PAD (peripheral artery disease) (05/18/2011)   - outpatient evaluation     Acute on chronic renal failure (08/21/2011)   - creatinine coming down   - Renal following     Pneumonia, organism unspecified (08/21/2011)/fever   - febrile last night, blood cultures pending   - will get repeat CXR today   - change ceftriaxone to Zosyn     Leukocytosis, unspecified (08/21/2011)   - resolved on antibiotics     DM (diabetes mellitus) (08/21/2011)   - BS much better last 24 hours, a little low this AM   - encourage evening snack but leave insulin dosing alone for now     Anemia of other chronic disease (08/21/2011)   - Hgb stable     Other and unspecified hyperlipidemia (08/21/2011)   - continue statin      Total time spent with patient: 35 minutes                   Care Plan discussed with: Patient, Nursing Staff and Consultant/Specialist    Discussed:  Care Plan and D/C Planning    Prophylaxis:  Hep SQ    Disposition:  Home w/Family           ___________________________________________________    Attending Physician: Marijo File, MD

## 2011-08-24 NOTE — Progress Notes (Signed)
Cardiology Progress Note         NAME:  Travis [name]   DOB:   [birth year]   MRN:   [id number]     Assessment/Plan:   1. NonSTEMI  stable  EF 40%.  Cont medical mgt for CAD although PCI CTO remains a consideration. Continue ranexa . Cont plavix, asa, BB, statin. Will plan for the MRI later, once he is recovers from ARF if he is agreeable.  High risk for readmission given his multiple comorbidities/ adv CAD.   2. Dyslipidemia: cont statin   3. HTN   4. PVD   5. Diabetes   6. Renal insufficiency:  Improved. Would stop ivfs  7. Left leg pain ? etiology                    Subjective:   Travis [name] is a 71 y.o. AA male  With a PMH of  diabetes poorly controlled, hypertension, hyperlipidemia, coronary artery disease and OSA and peripheral artery disease. Symptoms include: chest pressure/discomfort Cardiac risk factors: family history, dyslipidemia, diabetes mellitus, obesity, sedentary life style, male gender, hypertension. He presented to the ER after having used NTG for chest pain. Marland Kitchen He has severe CAD based on recent cardiac catheterization. He was recently discharged and was scheduled to see Dr. Welton Flakes as outpatient. He was last admitted with non-ST elevated myocardial infarction . Cath was done, but CAD was not amenable to PCI. Medical management was recommended at that time.   Of note his son recently has been diagnosed with AML and is under treatment. The patient is under stress regarding this and may have less interest in taking care of himself.        Cardiac ROS: not ambulating yet. Persistent left foot pain  Patient denies any exertional chest pain, dyspnea, palpitations, syncope, orthopnea, edema or paroxysmal nocturnal dyspnea.  Cardiac cath 2.25.13:  Distal left main: There was a 30 % stenosis.  -- Mid LAD: There was a diffuse 30 % stenosis.  -- Distal LAD: 70% stenosis of apical LAD.  -- Mid circumflex: There was a 100 % stenosis at the site of a prior  stent. This lesion is a  chronic total occlusion. -- 1st obtuse marginal:  There was a discrete 80 % stenosis at the ostium of the vessel segment.  -- Proximal RCA: There was a diffuse 80 % stenosis.  -- Mid RCA: There was a 100 % stenosis at the site of a prior stent. This  lesion is a chronic total occlusion.          Review of Systems: + foot pain.  No nausea, indigestion, vomiting, , cough, sputum. No bleeding. Appetite good.     Testing:   RENAL artery duplex1. Mild < 60 % stenosis in the right renal artery. 2. No significant stenosis in the left renal artery. 3. The right kidney measures 11.9 cm. 4. The left kidney measures 11.5 cm. MESENTERIC: 1. 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          Objective:     BP 163/75   Pulse 96   Temp 98.7 ??F (37.1 ??C)   Resp 22   Ht 5\' 6"  (1.676 m)   Wt 182 lb 3.2 oz (82.645 kg)   BMI 29.41 kg/m2   SpO2 95% O2 Flow Rate (L/min): 2 l/min O2 Device: Nasal cannula    Temp (24hrs), Avg:99.6 ??F (37.6 ??C), Min:98.4 ??F (36.9 ??C), Max:101.3 ??F (38.5 ??C)  03/30 0700 - 03/30 1859  In: -   Out: 150 [Urine:150]    03/28 1900 - 03/30 0659  In: 4247.5 [P.O.:360; I.V.:3887.5]  Out: 1025 [Urine:1025]  TELE: a paced, SR,      General: AAOx3 cooperative, no acute distress.  HEENT: Atraumatic. Pink and moist.  Anicteric sclerae.  Neck : Supple, no thyromegaly.   Lungs: CTA bilaterally. No wheezing/rhonchi/rales.  Heart: Regular rhythm, no murmur, no rubs, no gallops. No JVD. No carotid bruits.   Abdomen: Soft, non-distended, non-tender. + Bowel sounds. No bruits.  Extremities: Trace edema, Dressing L foot.  No edema, no clubbing, no cyanosis. No calf tenderness  Neurologic: Grossly intact.  Alert and oriented X 3.  No acute neurological distress.   Psych: Fair insight. Not anxious        Care Plan discussed with:    Comments   Patient y    Family      RN y    Futures trader                    Consultant:          Data Review:     Recent Labs   Basename 08/21/11 1625    ITNL 0.56*      Recent Labs   Basename  08/23/11 0603 08/22/11 0045 08/21/11 1830    CPK 338* 331* 288    CKMB -- -- --    TROIQ 6.44* 2.21* 1.63*     Recent Labs   Basename 08/24/11 0530 08/23/11 0603 08/22/11 0045 08/21/11 1830 08/21/11 1630    NA 139 138 136 -- --    K 3.7 4.1 4.7 -- --    CL 103 102 102 -- --    CO2 28 28 28  -- --    BUN 36* 44* 42* -- --    CREA 1.62* 2.01* 2.63* -- --    GLU 59* 118* 137* -- --    PHOS -- 2.7 -- -- --    MG -- 2.2 -- 1.9 --    ALB -- -- -- -- --    WBC -- 9.8 11.3* -- 13.2*    HGB -- 8.5* 7.8* -- 9.4*    HCT -- 26.1* 23.3* -- 28.7*    PLT -- 199 189 -- 179     No results found for this basename: INR:3,PTP:3,APTT:3, in the last 72 hours    Medications reviewed  Current Facility-Administered Medications   Medication Dose Route Frequency   ??? piperacillin-tazobactam (ZOSYN) 3.375 g in 0.9% sodium chloride (MBP/ADV) 100 mL MBP  3.375 g IntraVENous Q8H   ??? DISCONTD: piperacillin-tazobactam (ZOSYN) 2.25 g in 0.9% sodium chloride (MBP/ADV) 50 mL MBP  2.25 g IntraVENous Q6H   ??? insulin glargine (LANTUS) injection 30 Units  30 Units SubCUTAneous DAILY   ??? oxyCODONE CR (OXYCONTIN) tablet 10 mg  10 mg Oral Q12H   ??? epoetin alfa (EPOGEN;PROCRIT) injection 20,000 Units  20,000 Units SubCUTAneous Q7D   ??? gabapentin (NEURONTIN) capsule 300 mg  300 mg Oral TID   ??? insulin lispro (HUMALOG) injection   SubCUTAneous AC&HS   ??? dextrose (D50W) injection Syrg 12.5-25 g  12.5-25 g IntraVENous PRN   ??? ranolazine ER (RANEXA) tablet 500 mg  500 mg Oral DAILY   ??? famotidine (PEPCID) tablet 20 mg  20 mg Oral DAILY   ??? aspirin chewable tablet 81 mg  81 mg Oral DAILY   ??? atorvastatin (LIPITOR) tablet 40 mg  40  mg Oral QHS   ??? clopidogrel (PLAVIX) tablet 75 mg  75 mg Oral DAILY   ??? ferrous sulfate tablet 325 mg  325 mg Oral ACB&D   ??? fish oil-omega-3 fatty acids 340-1,000 mg capsule 1 Cap  1 Cap Oral BID   ??? isosorbide mononitrate ER (IMDUR) tablet 30 mg  30 mg Oral DAILY   ??? levothyroxine (SYNTHROID) tablet 150 mcg  150 mcg Oral ACB   ???  metoprolol (LOPRESSOR) tablet 100 mg  100 mg Oral BID   ??? misoprostol (CYTOTEC) tablet 200 mcg  200 mcg Oral BID   ??? nitroglycerin (NITROSTAT) tablet 0.4 mg  0.4 mg SubLINGual PRN   ??? oxyCODONE-acetaminophen (PERCOCET 7.5) 7.5-325 mg per tablet 1 Tab  1 Tab Oral Q4H PRN   ??? terazosin (HYTRIN) capsule 10 mg  10 mg Oral QHS   ??? acetaminophen (TYLENOL) tablet 650 mg  650 mg Oral Q4H PRN   ??? ondansetron (ZOFRAN) injection 4 mg  4 mg IntraVENous Q4H PRN   ??? 0.9% sodium chloride infusion  75 mL/hr IntraVENous CONTINUOUS   ??? heparin (porcine) injection 5,000 Units  5,000 Units SubCUTAneous Q8H   ??? pantoprazole (PROTONIX) tablet 40 mg  40 mg Oral ACB   ??? azithromycin (ZITHROMAX) 500 mg in 0.9% sodium chloride 250 mL IVPB  500 mg IntraVENous Q24H   ??? levalbuterol (XOPENEX) nebulizer soln 0.63 mg/3 ml  0.63 mg Nebulization Q4H PRN   ??? glucose chewable tablet 16 g  4 Tab Oral PRN   ??? dextrose (D50W) injection Syrg 12.5-25 g  12.5-25 g IntraVENous PRN   ??? glucagon (GLUCAGEN) injection 1 mg  1 mg IntraMUSCular PRN   ??? DISCONTD: cefTRIAXone (ROCEPHIN) 1 g in 0.9% sodium chloride (MBP/ADV) 50 mL MBP  1 g IntraVENous Q24H         [name], MD

## 2011-08-24 NOTE — Progress Notes (Addendum)
Pt sitting up in bed eating no complaints..   Co foot pain     Will work w pt once daughter bring shoe in

## 2011-08-24 NOTE — Progress Notes (Signed)
Bedside and Verbal shift change report given to Hulda Marin., RN (oncoming nurse) by De Blanch., RN (offgoing nurse).  Report given with SBAR, Kardex, MAR and Recent Results.

## 2011-08-24 NOTE — Progress Notes (Signed)
Courtesy note    Creatinine is better    We will check back 1APR call if ? In interim

## 2011-08-24 NOTE — Other (Signed)
Cardiopulmonary Rehab:  Hotel manager on MI provided for patient.  Patient resting at this time.  Will follow for teaching as appropriate.

## 2011-08-24 NOTE — Progress Notes (Signed)
Bedside shift report given to Nurse freeman  RN by Peter Dunkley, RN.  Report given using SBAR.

## 2011-08-24 NOTE — Progress Notes (Signed)
Spoke with patient.  States he forgot to tell his daughter to check on an off loading shoe from home.  Patient also does not feel that a shoe would help with his pain "they need to bring in someone who knows what they are doing!  To get this pain better! They are looking at my heart.  They need to look at my foot!"   Currently reports pain is 5/10 in bed and 10/10 when he tries to walk.  At present refusing mobility until foot pain is better.  Recommend reconsult Physical Therapy when patient's pain is controlled and he is able to participate in mobility.

## 2011-08-24 NOTE — Progress Notes (Signed)
Problem: Mobility Impaired (Adult and Pediatric)  Goal: *Acute Goals and Plan of Care (Insert Text)  Physical Therapy Goals  Initiated 08/24/2011  1. Patient will move from supine to sit and sit to supine in bed with independence within 7 day(s).   2. Patient will transfer from bed to chair and chair to bed with supervision/set-up using the least restrictive device within 7 day(s).  3. Patient will perform sit to stand with modified independence within 7 day(s).  4. Patient will ambulate with min assist for 75 feet with the least restrictive device within 7 day(s).   5. Patient will ascend/descend stairs with handrail(s) with minimal assistance/contact guard assist within 7 day(s), if he has stairs at his discharge location.  PHYSICAL THERAPY EVALUATION  Patient: Travis [name] (71 y.o. male)  Date: 08/24/2011  Primary Diagnosis: Chest pain, unspecified  Chest pain, unspecified        Precautions: falls        ASSESSMENT :   Based on the objective data described below, the patient presents with weakness, pain, poor sitting and standing balance, and some confusion (can't remembering eating his lunch two hours ago).  Daughter brought pt's shoe from home, her and son present throughout treatment.  Patient with poor initial sitting and standing balance (leans posterior), improves with training.  Mobility limited to walking around bed twice with walker and assist of two (initially mod, improves to min of two).  Pain better controlled this pm.    Patient will benefit from skilled intervention to address the above impairments.  Patient???s rehabilitation potential is considered to be Good  Factors which may influence rehabilitation potential include:   [ ]          None noted  [X]          Mental ability/status  [X]          Medical condition  [ ]          Home/family situation and support systems  [X]          Safety awareness  [X]          Pain tolerance/management  [ ]          Other:        PLAN :   Recommendations and Planned  Interventions:  [X]            Bed Mobility Training             [ ]     Neuromuscular Re-Education  [X]            Transfer Training                   [ ]     Orthotic/Prosthetic Training  [X]            Gait Training                         [ ]     Modalities  [X]            Therapeutic Exercises           [ ]     Edema Management/Control  [X]            Therapeutic Activities            [ ]     Patient and Family Training/Education  [ ]            Other (comment):    Frequency/Duration: Patient will be followed by physical therapy 5 times  a week to address goals.  Discharge Recommendations: Skilled Nursing Facility  Further Equipment Recommendations for Discharge: continue to assess       SUBJECTIVE:   Patient stated ???shouldn't I have two shoes like that one?.??? [offloading shoe on left]      OBJECTIVE DATA SUMMARY:       Past Medical History   Diagnosis Date   ??? CAD (coronary artery disease)         see problem list   ??? HTN (hypertension)     ??? PVD (peripheral vascular disease)     ??? DM (diabetes mellitus)     ??? Hyperlipidemia     ??? Sleep apnea         on CPAP   ??? COPD (chronic obstructive pulmonary disease)         mild COPD, recurrent pneumonia, chronic bronchitis (followed by Dr. Leroy Libman)   ??? CKD (chronic kidney disease)         Cr 1.7 in 10/09   ??? CHF (congestive heart failure)     ??? Anemia     ??? Pulmonary hypertension         PASP 60 on echo 06/22/09   ??? Thromboembolus         leg    ??? Thyroid disease     ??? Pneumonia     ??? GERD (gastroesophageal reflux disease)     ??? Chronic pain         back pain   ??? Cataract     ??? GI bleed         multiple bleeding episodes and blood transfusions.  Dr. Monika Salk has followed..  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   ??? Foot ulcer 07/18/2011     Past Surgical History   Procedure Date   ??? Hx orthopaedic         knee replacement   ??? Pr vascular surgery procedure unlist         arterial bypass   ??? Hx pacemaker     ??? Pr cardiac surg procedure unlist         cardiac stent   ??? Hx cataract removal          removed rt eye     Prior Level of Function/Home Situation:   Home Situation  Home Environment: Private residence  # Steps to Enter: 4   One/Two Story Residence: One story  Living Alone: Yes   Support Systems: Child(ren);Home care staff  Patient Expects to be Discharged to:: Private residence  Current DME Used/Available at Home: Glucometer  Critical Behavior:  Neurologic State: Lethargic  Orientation Level: Oriented X4  Cognition: Appropriate for age attention/concentration       Strength:                         Tone & Sensation:                                  Range Of Motion:                          Coordination:       Functional Mobility:  Bed Mobility:     Supine to Sit: Assist X1;Minimal assistance  Sit to Supine: Assist X1;Minimum assistance;Additional time     Transfers:  Sit to Stand: Moderate assistance;Assist X2;Additional time;Verbal cues;Safety concerns  Stand to Sit: Moderate assistance;Assist X2;Additional time;Verbal cues;Safety concerns                       Balance:   Sitting: Impaired  Sitting - Static: Poor (constant support);Fair (occasional) (initially poor, improves to good)  Sitting - Dynamic: Poor (constant support)  Standing: With support;Impaired  Ambulation/Gait Training:  Distance (ft): 12 Feet (ft) (twice)  Assistive Device: Gait belt;Walker, rolling (off load shoe on left)  Ambulation - Level of Assistance: Moderate assistance (of two)        Gait Abnormalities: Antalgic;Step to gait           Stance: Left increased;Right increased;Weight shift (initially weight shifted right)     Step Length: Left shortened;Right shortened                               Pain:  Pain Scale 1: Numeric (0 - 10)  Pain Intensity 1: 0              Activity Tolerance:   Heart rate increased 15 beats with activity.  Please refer to the flowsheet for vital signs taken during this treatment.  After treatment:   [ ]          Patient left in no apparent distress sitting up in chair  [X]          Patient left in no  apparent distress in bed  [X]          Call bell left within reach  [X]          Nursing notified  [ ]          Caregiver present  [ ]          Bed alarm activated      COMMUNICATION/EDUCATION:   The patient???s plan of care was discussed with: Registered Nurse.  [X]          Fall prevention education was provided and the patient/caregiver indicated understanding.  [X]          Patient/family have participated as able in goal setting and plan of care.  [X]          Patient/family agree to work toward stated goals and plan of care.  [ ]          Patient understands intent and goals of therapy, but is neutral about his/her participation.  [ ]          Patient is unable to participate in goal setting and plan of care.    Thank you for this referral.  Brita Romp, PT   Time Calculation: 39 mins

## 2011-08-24 NOTE — Progress Notes (Signed)
Travis [name], Cardiac Rehab requested visit. Pt concerned for son and dtr. Pt down about feeling up and down.  PC will follow as able and needed.  Provided pt empathetic listening, prayer and information about Spiritual Care Services.  Visited by  Lysbeth Penner, Chaplain PRN  Spiritual Care Pager [phone])

## 2011-08-24 NOTE — Progress Notes (Signed)
[facility] Dosing Services: Antimicrobial Stewardship Progress Note    Consult for antibiotic dosing of zosyn by Dr. Verlin Fester  Pharmacist reviewed antibiotic appropriateness for 71 year old , male  for indication of pneumonia  Day of Therapy 1    Plan:    Non-Kinetic Antimicrobial Dosing:   Current Regimen:  Zosyn 2.25 gram iv every 6 hours  Recommendation: zosyn 3.375 gram iv every 8 hours each dose over 4 hours  Other Antimicrobial  (not dosed by pharmacist)   azithromycin   Cultures     3/27 BLOOD NG pending  3/29 Blood inprocess   Serum Creatinine     Lab Results   Component Value Date/Time    Creatinine (POC) 2.3 08/21/2011  4:26 PM    Creatinine 1.62 08/24/2011  5:30 AM       Creatinine Clearance Estimated Creatinine Clearance: 42.2 ml/min (based on Cr of 1.62).     Temp   98.5 ??F (36.9 ??C) ()    WBC   Lab Results   Component Value Date/Time    WBC 9.8 08/23/2011  6:03 AM       H/H   Lab Results   Component Value Date/Time    HGB 8.5 08/23/2011  6:03 AM        Platelets   Lab Results   Component Value Date/Time    PLATELET 199 08/23/2011  6:03 AM          Pharmacist: Signed Lucious Groves, PHARMD Contact information: [phone]

## 2011-08-24 NOTE — Progress Notes (Signed)
Spoke with nurse after earlier attempt.  Nurse gave patient some more pain meds around 1100.  Patient's pain now 3/10.  He called his daughter who should be in this afternoon with the shoe.  Patient states he could try mobility once shoe is here.  Will continue to check with patient this afternoon.

## 2011-08-25 LAB — CBC W/O DIFF
HCT: 22.5 % — ABNORMAL LOW (ref 36.6–50.3)
HGB: 7.3 g/dL — ABNORMAL LOW (ref 12.1–17.0)
MCH: 28 PG (ref 26.0–34.0)
MCHC: 32.4 g/dL (ref 30.0–36.5)
MCV: 86.2 FL (ref 80.0–99.0)
PLATELET: 189 10*3/uL (ref 150–400)
RBC: 2.61 M/uL — ABNORMAL LOW (ref 4.10–5.70)
RDW: 17.5 % — ABNORMAL HIGH (ref 11.5–14.5)
WBC: 10 10*3/uL (ref 4.1–11.1)

## 2011-08-25 LAB — METABOLIC PANEL, BASIC
Anion gap: 7 mmol/L (ref 5–15)
BUN/Creatinine ratio: 16 (ref 12–20)
BUN: 36 MG/DL — ABNORMAL HIGH (ref 6–20)
CO2: 27 MMOL/L (ref 21–32)
Calcium: 7.5 MG/DL — ABNORMAL LOW (ref 8.5–10.1)
Chloride: 103 MMOL/L (ref 97–108)
Creatinine: 2.28 MG/DL — ABNORMAL HIGH (ref 0.45–1.15)
GFR est AA: 34 mL/min/{1.73_m2} — ABNORMAL LOW (ref >60)
GFR est non-AA: 28 mL/min/{1.73_m2} — ABNORMAL LOW (ref >60)
Glucose: 123 MG/DL — ABNORMAL HIGH (ref 65–100)
Potassium: 4.3 MMOL/L (ref 3.5–5.1)
Sodium: 137 MMOL/L (ref 136–145)

## 2011-08-25 LAB — GLUCOSE, POC
Glucose (POC): 205 mg/dL — ABNORMAL HIGH (ref 75–110)
Glucose (POC): 118 mg/dL — ABNORMAL HIGH (ref 75–110)
Glucose (POC): 241 mg/dL — ABNORMAL HIGH (ref 75–110)
Glucose (POC): 189 mg/dL — ABNORMAL HIGH (ref 75–110)

## 2011-08-25 LAB — PHOSPHORUS: Phosphorus: 2.7 MG/DL (ref 2.5–4.9)

## 2011-08-25 LAB — MAGNESIUM: Magnesium: 2.2 MG/DL (ref 1.6–2.4)

## 2011-08-25 MED ORDER — LEVOFLOXACIN IN D5W 750 MG/150 ML IV PIGGY BACK
750 mg/150 mL | INTRAVENOUS | Status: DC
Start: 2011-08-25 — End: 2011-08-27
  Administered 2011-08-25: 15:00:00 via INTRAVENOUS

## 2011-08-25 MED FILL — RANEXA 500 MG TABLET,EXTENDED RELEASE: 500 mg | ORAL | Qty: 1

## 2011-08-25 MED FILL — INSULIN LISPRO 100 UNIT/ML INJECTION: 100 unit/mL | SUBCUTANEOUS | Qty: 1

## 2011-08-25 MED FILL — FERROUS SULFATE 325 MG (65 MG ELEMENTAL IRON) TAB: 325 mg (65 mg iron) | ORAL | Qty: 1

## 2011-08-25 MED FILL — OXYCONTIN 10 MG TABLET,EXTENDED RELEASE: 10 mg | ORAL | Qty: 1

## 2011-08-25 MED FILL — ACETAMINOPHEN 325 MG TABLET: 325 mg | ORAL | Qty: 2

## 2011-08-25 MED FILL — GABAPENTIN 300 MG CAP: 300 mg | ORAL | Qty: 1

## 2011-08-25 MED FILL — INSULIN GLARGINE 100 UNIT/ML INJECTION: 100 unit/mL | SUBCUTANEOUS | Qty: 0.3

## 2011-08-25 MED FILL — MISOPROSTOL 200 MCG TAB: 200 mcg | ORAL | Qty: 1

## 2011-08-25 MED FILL — LEVOTHYROXINE 150 MCG TAB: 150 mcg | ORAL | Qty: 1

## 2011-08-25 MED FILL — SODIUM CHLORIDE 0.9 % IV PIGGY BACK: INTRAVENOUS | Qty: 100

## 2011-08-25 MED FILL — HEPARIN (PORCINE) 5,000 UNIT/ML IJ SOLN: 5000 unit/mL | INTRAMUSCULAR | Qty: 1

## 2011-08-25 MED FILL — LEVAQUIN 750 MG/150 ML IN 5 % DEXTROSE INTRAVENOUS PIGGYBACK: 750 mg/150 mL | INTRAVENOUS | Qty: 150

## 2011-08-25 MED FILL — FAMOTIDINE 20 MG TAB: 20 mg | ORAL | Qty: 1

## 2011-08-25 MED FILL — PANTOPRAZOLE 40 MG TAB, DELAYED RELEASE: 40 mg | ORAL | Qty: 1

## 2011-08-25 MED FILL — FISH OIL 340 MG-1,000 MG CAPSULE: 340-1000 mg | ORAL | Qty: 1

## 2011-08-25 MED FILL — LIPITOR 20 MG TABLET: 20 mg | ORAL | Qty: 2

## 2011-08-25 MED FILL — ZITHROMAX 500 MG INTRAVENOUS SOLUTION: 500 mg | INTRAVENOUS | Qty: 5

## 2011-08-25 MED FILL — METOPROLOL TARTRATE 50 MG TAB: 50 mg | ORAL | Qty: 2

## 2011-08-25 MED FILL — PLAVIX 75 MG TABLET: 75 mg | ORAL | Qty: 1

## 2011-08-25 MED FILL — ISOSORBIDE MONONITRATE SR 30 MG 24 HR TAB: 30 mg | ORAL | Qty: 1

## 2011-08-25 MED FILL — TERAZOSIN 5 MG CAP: 5 mg | ORAL | Qty: 2

## 2011-08-25 MED FILL — ASPIRIN 81 MG CHEWABLE TAB: 81 mg | ORAL | Qty: 1

## 2011-08-25 NOTE — Progress Notes (Signed)
[facility]  [address] [place], [place] [address]  [phone]      Medical Progress Note      NAME: Travis [name]   DOB:  [birth year]  MRM:  [id number]    Date/Time: 08/25/2011         Subjective:     Chief Complaint:  Patient was seen and examined by me.  Chart reviewed.  Denied chest pain       Objective:       Vitals:       Last 24hrs VS reviewed since prior progress note. Most recent are:    Visit Vitals   Item Reading   ??? BP 103/58   ??? Pulse 75   ??? Temp 99.3 ??F (37.4 ??C)   ??? Resp 18   ??? Ht 5\' 6"  (1.676 m)   ??? Wt 182 lb 3.2 oz   ??? BMI 29.41 kg/m2   ??? SpO2 100%     SpO2 Readings from Last 6 Encounters:   08/25/11 100%   08/13/11 99%   08/13/11 99%   07/24/11 98%   05/25/11 97%   11/13/09 98%    O2 Flow Rate (L/min): 2 l/min     Intake/Output Summary (Last 24 hours) at 08/25/11 0841  Last data filed at 08/24/11 2027   Gross per 24 hour   Intake    840 ml   Output    150 ml   Net    690 ml        Exam:     Physical Exam:    Gen:  Well-developed, well-nourished, in no acute distress  HEENT:  Pink conjunctivae, PERRL, hearing intact to voice, moist mucous membranes  Neck:  Supple, without masses, thyroid non-tender  Resp:  No accessory muscle use, clear breath sounds without wheezes rales or rhonchi  Card:  No murmurs, normal S1, S2 without thrills,   Abd:  Soft, non-tender, non-distended, normoactive bowel sounds are present, no palpable organomegaly and no detectable hernias  Musc:  No cyanosis or clubbing  Skin:  No rashes or ulcers, skin turgor is good  Neuro:  Cranial nerves 3-12 are grossly intact,  follows commands appropriately  Psych:  Good insight, oriented to person, place and time, alert      Medications Reviewed: (see below)    Lab Data Reviewed: (see below)    ______________________________________________________________________    Medications:     Current Facility-Administered Medications   Medication Dose Route Frequency   ??? piperacillin-tazobactam (ZOSYN) 3.375 g  in 0.9% sodium chloride (MBP/ADV) 100 mL MBP  3.375 g IntraVENous Q8H   ??? ranolazine ER (RANEXA) tablet 500 mg  500 mg Oral BID   ??? insulin glargine (LANTUS) injection 30 Units  30 Units SubCUTAneous DAILY   ??? oxyCODONE CR (OXYCONTIN) tablet 10 mg  10 mg Oral Q12H   ??? epoetin alfa (EPOGEN;PROCRIT) injection 20,000 Units  20,000 Units SubCUTAneous Q7D   ??? gabapentin (NEURONTIN) capsule 300 mg  300 mg Oral TID   ??? insulin lispro (HUMALOG) injection   SubCUTAneous AC&HS   ??? dextrose (D50W) injection Syrg 12.5-25 g  12.5-25 g IntraVENous PRN   ??? famotidine (PEPCID) tablet 20 mg  20 mg Oral DAILY   ??? aspirin chewable tablet 81 mg  81 mg Oral DAILY   ??? atorvastatin (LIPITOR) tablet 40 mg  40 mg Oral QHS   ??? clopidogrel (PLAVIX) tablet 75 mg  75 mg Oral DAILY   ???  ferrous sulfate tablet 325 mg  325 mg Oral ACB&D   ??? fish oil-omega-3 fatty acids 340-1,000 mg capsule 1 Cap  1 Cap Oral BID   ??? isosorbide mononitrate ER (IMDUR) tablet 30 mg  30 mg Oral DAILY   ??? levothyroxine (SYNTHROID) tablet 150 mcg  150 mcg Oral ACB   ??? metoprolol (LOPRESSOR) tablet 100 mg  100 mg Oral BID   ??? misoprostol (CYTOTEC) tablet 200 mcg  200 mcg Oral BID   ??? nitroglycerin (NITROSTAT) tablet 0.4 mg  0.4 mg SubLINGual PRN   ??? oxyCODONE-acetaminophen (PERCOCET 7.5) 7.5-325 mg per tablet 1 Tab  1 Tab Oral Q4H PRN   ??? terazosin (HYTRIN) capsule 10 mg  10 mg Oral QHS   ??? acetaminophen (TYLENOL) tablet 650 mg  650 mg Oral Q4H PRN   ??? ondansetron (ZOFRAN) injection 4 mg  4 mg IntraVENous Q4H PRN   ??? heparin (porcine) injection 5,000 Units  5,000 Units SubCUTAneous Q8H   ??? pantoprazole (PROTONIX) tablet 40 mg  40 mg Oral ACB   ??? azithromycin (ZITHROMAX) 500 mg in 0.9% sodium chloride 250 mL IVPB  500 mg IntraVENous Q24H   ??? levalbuterol (XOPENEX) nebulizer soln 0.63 mg/3 ml  0.63 mg Nebulization Q4H PRN   ??? glucose chewable tablet 16 g  4 Tab Oral PRN   ??? dextrose (D50W) injection Syrg 12.5-25 g  12.5-25 g IntraVENous PRN   ??? glucagon (GLUCAGEN) injection  1 mg  1 mg IntraMUSCular PRN          Lab Review:     Recent Labs   Basename 08/25/11 0230 08/23/11 0603    WBC 10.0 9.8    HGB 7.3* 8.5*    HCT 22.5* 26.1*    PLT 189 199     Recent Labs   Basename 08/25/11 0230 08/24/11 0530 08/23/11 0603    NA 137 139 138    K 4.3 3.7 4.1    CL 103 103 102    CO2 27 28 28     GLU 123* 59* 118*    BUN 36* 36* 44*    CREA 2.28* 1.62* 2.01*    CA 7.5* 7.6* 7.7*    MG 2.2 -- 2.2    PHOS 2.7 -- 2.7    ALB -- -- --    TBIL -- -- --    SGOT -- -- --    INR -- -- --     Lab Results   Component Value Date/Time    POC GLUCOSE 118 08/25/2011  7:19 AM    POC GLUCOSE 205 08/24/2011  9:14 PM    POC GLUCOSE 254 08/24/2011  4:32 PM    POC GLUCOSE 97 08/24/2011 11:13 AM    POC GLUCOSE 103 08/24/2011  7:46 AM          Assessment / Plan:     Principal Problem:    71 yo hx of CAD, syst CHF EF 40%, DM, HTN, PVD, CKD, presented w/ NSTEMI, ARF    1) Chest pain/NSTEMI: Cards following.  Cont BB, nitro, plavix, ranexa.  Plan for cardiac MRI to eval viability prior to cath    2) ARF/CKD: unclear level of CKD.  ARF likely volume related.  Renal is following.  Monitor BMP    3) Pneumonia: patchy ASDz seen on admission.  This could be related to interstitial edema.  Will d/c zosyn/azithro.  Complete a course of levaquin.  Monitor blood Cx    4) DM: cont lantus, SSI  5) Anemia: likely chronic, related to CKD.  Also had a hx of GI bleed from AVMs.  Checking stool blood, iron studies.  Will give 1 u RBC (given MI)    6) PVD: hx of bypass in legs.  Vasc surg following    Total time spent with patient: 47 Minutes                  Care Plan discussed with: Patient    Discussed:  Care Plan    Prophylaxis:  Hep SQ    Disposition:  Home w/Family           ___________________________________________________    Attending Physician: Twanna Hy. Tabb Croghan, MD

## 2011-08-25 NOTE — Progress Notes (Signed)
Pt had 4 beat run of VTACH per monitor tech, verified via tele strip, also, capturing issues w/ pacemaker. No s/s of distress observed or reported, pt asymptomatic.

## 2011-08-25 NOTE — Progress Notes (Signed)
Received report from previous shift   Kathlyn Sacramento RN.care assumed.

## 2011-08-25 NOTE — Progress Notes (Signed)
Bedside shift report given to Rosina Lowenstein, RN by Gigi Gin, RN. Report given using SBAR & kardex.

## 2011-08-25 NOTE — Progress Notes (Signed)
Cardiology Progress Note         NAME:  Travis [name]   DOB:   11/28/1940   MRN:   [id number]     Assessment/Plan:   1. NonSTEMI  stable  EF 40%.  Cont medical mgt for CAD although PCI CTO remains a consideration. Continue ranexa . Cont plavix, asa, BB, statin. Will plan for the MRI later, once he is recovers from ARF if he is agreeable.  High risk for readmission given his multiple comorbidities/ adv CAD.   2. Dyslipidemia: cont statin   3. HTN   4. PVD   5. Diabetes   6. Renal insufficiency:  Improved.   7. Left leg pain ? Etiology  8. Depressed       Continue current Rx    Subjective:   Travis [name] is a 71 y.o. AA male  With a PMH of  diabetes poorly controlled, hypertension, hyperlipidemia, coronary artery disease and OSA and peripheral artery disease. Symptoms include: chest pressure/discomfort Cardiac risk factors: family history, dyslipidemia, diabetes mellitus, obesity, sedentary life style, male gender, hypertension. He presented to the ER after having used NTG for chest pain. Marland Kitchen He has severe CAD based on recent cardiac catheterization. He was recently discharged and was scheduled to see Dr. Welton Flakes as outpatient. He was last admitted with non-ST elevated myocardial infarction . Cath was done, but CAD was not amenable to PCI. Medical management was recommended at that time.   Of note his son recently has been diagnosed with AML and is under treatment. The patient is under stress regarding this and may have less interest in taking care of himself.        Cardiac ROS: not ambulating yet. Persistent left foot pain  Patient denies any exertional chest pain, dyspnea, palpitations, syncope, orthopnea, edema or paroxysmal nocturnal dyspnea.  Cardiac cath 2.25.13:  Distal left main: There was a 30 % stenosis.  -- Mid LAD: There was a diffuse 30 % stenosis.  -- Distal LAD: 70% stenosis of apical LAD.  -- Mid circumflex: There was a 100 % stenosis at the site of a prior  stent. This  lesion is a chronic total occlusion. -- 1st obtuse marginal:  There was a discrete 80 % stenosis at the ostium of the vessel segment.  -- Proximal RCA: There was a diffuse 80 % stenosis.  -- Mid RCA: There was a 100 % stenosis at the site of a prior stent. This  lesion is a chronic total occlusion.          Review of Systems: + foot pain.  No nausea, indigestion, vomiting, , cough, sputum. No bleeding. Appetite good.     Testing:   RENAL artery duplex1. Mild < 60 % stenosis in the right renal artery. 2. No significant stenosis in the left renal artery. 3. The right kidney measures 11.9 cm. 4. The left kidney measures 11.5 cm. MESENTERIC: 1. 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          Objective:     BP 141/66   Pulse 115   Temp 99.3 ??F (37.4 ??C)   Resp 18   Ht 5\' 6"  (1.676 m)   Wt 182 lb 3.2 oz (82.645 kg)   BMI 29.41 kg/m2   SpO2 100% O2 Flow Rate (L/min): 2 l/min O2 Device: Nasal cannula    Temp (24hrs), Avg:99.7 ??F (37.6 ??C), Min:99.3 ??F (37.4 ??C), Max:100.3 ??F (37.9 ??C)  03/29 1900 - 03/31 0659  In: 2046.3 [P.O.:830; I.V.:1216.3]  Out: 400 [Urine:400]  TELE: a paced, SR,      General: AAOx3 cooperative, no acute distress.  HEENT: Atraumatic. Pink and moist.  Anicteric sclerae.  Neck : Supple, no thyromegaly.   Lungs: CTA bilaterally. No wheezing/rhonchi/rales.  Heart: Regular rhythm, no murmur, no rubs, no gallops. No JVD. No carotid bruits.   Abdomen: Soft, non-distended, non-tender. + Bowel sounds. No bruits.  Extremities: Trace edema, Dressing L foot.  No edema, no clubbing, no cyanosis. No calf tenderness  Neurologic: Grossly intact.  Alert and oriented X 3.  No acute neurological distress.   Psych: Fair insight. Not anxious        Care Plan discussed with:    Comments   Patient y    Family      RN y    Futures trader                    Consultant:          Data Review:     No results found for this basename: ITNL in the last 72 hours   Recent Labs   [facility] 08/23/11 0603    CPK 338*    CKMB  --    TROIQ 6.44*     Recent Labs   Basename 08/25/11 0230 08/24/11 0530 08/23/11 0603    NA 137 139 138    K 4.3 3.7 4.1    CL 103 103 102    CO2 27 28 28     BUN 36* 36* 44*    CREA 2.28* 1.62* 2.01*    GLU 123* 59* 118*    PHOS 2.7 -- 2.7    MG 2.2 -- 2.2    ALB -- -- --    WBC 10.0 -- 9.8    HGB 7.3* -- 8.5*    HCT 22.5* -- 26.1*    PLT 189 -- 199     No results found for this basename: INR:3,PTP:3,APTT:3, in the last 72 hours    Medications reviewed  Current Facility-Administered Medications   Medication Dose Route Frequency   ??? levofloxacin (LEVAQUIN) 750 mg in D5W IVPB  750 mg IntraVENous Q48H   ??? ranolazine ER (RANEXA) tablet 500 mg  500 mg Oral BID   ??? DISCONTD: piperacillin-tazobactam (ZOSYN) 3.375 g in 0.9% sodium chloride (MBP/ADV) 100 mL MBP  3.375 g IntraVENous Q8H   ??? insulin glargine (LANTUS) injection 30 Units  30 Units SubCUTAneous DAILY   ??? oxyCODONE CR (OXYCONTIN) tablet 10 mg  10 mg Oral Q12H   ??? epoetin alfa (EPOGEN;PROCRIT) injection 20,000 Units  20,000 Units SubCUTAneous Q7D   ??? gabapentin (NEURONTIN) capsule 300 mg  300 mg Oral TID   ??? insulin lispro (HUMALOG) injection   SubCUTAneous AC&HS   ??? dextrose (D50W) injection Syrg 12.5-25 g  12.5-25 g IntraVENous PRN   ??? famotidine (PEPCID) tablet 20 mg  20 mg Oral DAILY   ??? aspirin chewable tablet 81 mg  81 mg Oral DAILY   ??? atorvastatin (LIPITOR) tablet 40 mg  40 mg Oral QHS   ??? clopidogrel (PLAVIX) tablet 75 mg  75 mg Oral DAILY   ??? ferrous sulfate tablet 325 mg  325 mg Oral ACB&D   ??? fish oil-omega-3 fatty acids 340-1,000 mg capsule 1 Cap  1 Cap Oral BID   ??? isosorbide mononitrate ER (IMDUR) tablet 30 mg  30 mg Oral DAILY   ??? levothyroxine (SYNTHROID)  tablet 150 mcg  150 mcg Oral ACB   ??? metoprolol (LOPRESSOR) tablet 100 mg  100 mg Oral BID   ??? misoprostol (CYTOTEC) tablet 200 mcg  200 mcg Oral BID   ??? nitroglycerin (NITROSTAT) tablet 0.4 mg  0.4 mg SubLINGual PRN   ??? oxyCODONE-acetaminophen (PERCOCET 7.5) 7.5-325 mg per tablet 1 Tab  1 Tab  Oral Q4H PRN   ??? terazosin (HYTRIN) capsule 10 mg  10 mg Oral QHS   ??? acetaminophen (TYLENOL) tablet 650 mg  650 mg Oral Q4H PRN   ??? ondansetron (ZOFRAN) injection 4 mg  4 mg IntraVENous Q4H PRN   ??? heparin (porcine) injection 5,000 Units  5,000 Units SubCUTAneous Q8H   ??? pantoprazole (PROTONIX) tablet 40 mg  40 mg Oral ACB   ??? levalbuterol (XOPENEX) nebulizer soln 0.63 mg/3 ml  0.63 mg Nebulization Q4H PRN   ??? glucose chewable tablet 16 g  4 Tab Oral PRN   ??? dextrose (D50W) injection Syrg 12.5-25 g  12.5-25 g IntraVENous PRN   ??? glucagon (GLUCAGEN) injection 1 mg  1 mg IntraMUSCular PRN   ??? DISCONTD: azithromycin (ZITHROMAX) 500 mg in 0.9% sodium chloride 250 mL IVPB  500 mg IntraVENous Q24H         [name], MD

## 2011-08-25 NOTE — Progress Notes (Signed)
[facility] Dosing Services: Antimicrobial Stewardship Progress Note    Consult for antibiotic dosing of Levaquin by Dr. Drucilla Schmidt  Pharmacist reviewed antibiotic appropriateness for 71 year old male  for indication of PNA  Day of Therapy 1   Plan:    Non-Kinetic Antimicrobial Dosing:   Recommendation: Levaquin 750 mg q48h  Other Antimicrobial  (not dosed by pharmacist)   none   Cultures     3/27 Blood no growth so far  3/29 Blood no growth so far   Serum Creatinine     Lab Results   Component Value Date/Time    Creatinine (POC) 2.3 08/21/2011  4:26 PM    Creatinine 2.28 08/25/2011  2:30 AM       Creatinine Clearance Estimated Creatinine Clearance: 30 ml/min (based on Cr of 2.28).     Temp   99.3 ??F (37.4 ??C) ()    WBC   Lab Results   Component Value Date/Time    WBC 10.0 08/25/2011  2:30 AM       H/H   Lab Results   Component Value Date/Time    HGB 7.3 08/25/2011  2:30 AM        Platelets   Lab Results   Component Value Date/Time    PLATELET 189 08/25/2011  2:30 AM          Pharmacist: Signed MARGARET R CAVANAUGH, RPh, BCPS [phone])

## 2011-08-25 NOTE — Progress Notes (Signed)
Bedside and Verbal shift change report given to   Chase Picket Rn (oncoming nurse) by Hassan Rowan Rn (offgoing nurse). Report given with SBAR, Kardex, Procedure Summary, MAR and Recent Results.

## 2011-08-25 NOTE — Progress Notes (Signed)
Assumed care of pt all questions answered  using SBAR

## 2011-08-25 NOTE — Progress Notes (Signed)
Problem: Self Care Deficits Care Plan (Adult)  Goal: *Acute Goals and Plan of Care (Insert Text)  Occupational Therapy Goals  Initiated 08/25/2011  1. Patient will perform bathing with supervision/set-up within 7 day(s).  2. Patient will perform lower body dressing with supervision/set-up within 7 day(s).  3. Patient will perform toilet transfers with minimal assistance/contact guard assist within 7 day(s).  4. Patient will perform all aspects of toileting with supervision/set-up within 7 day(s).  5. Patient will participate in upper extremity therapeutic exercise/activities with modified independence for 10 minutes within 7 day(s).   OCCUPATIONAL THERAPY EVALUATION  Patient: MAVERIC [name] (71 y.o. male)  Date: 08/25/2011  Primary Diagnosis: Chest pain, unspecified  Chest pain, unspecified        Precautions: Fall      ASSESSMENT :   Based on the objective data described below, the patient presents with decreased strength, endurance, mobility, balance and safety.  Pt with angry affect and stated he will not be using any equipment at home, so doesn't understand why he needs to in the hospital.  Pt is currently at an overall mod A x 2 for functional mobility due to excessive posterior wt shift and mod A level with basic ADLs.  Pt demonstrates decreased safety awareness and will need 24/7 assist at discharge.  Recommend SNF to maximize his safety and independence with all functional tasks.    Patient will benefit from skilled intervention to address the above impairments.  Patient???s rehabilitation potential is considered to be Guarded  Factors which may influence rehabilitation potential include:   [X]              None noted  [ ]              Mental ability/status  [ ]              Medical condition  [ ]              Home/family situation and support systems  [ ]              Safety awareness  [ ]              Pain tolerance/management  [ ]              Other:        PLAN :   Recommendations and Planned Interventions:  [X]                 Self Care Training                  [X]         Therapeutic Activities  [X]                Functional Mobility Training    [X]         Cognitive Retraining  [X]                Therapeutic Exercises           [X]         Endurance Activities  [X]                Balance Training                   [X]         Neuromuscular Re-Education  [ ]                Visual/Perceptual Training     [X]    Home Safety Training  [X]   Patient Education                 [X]         Family Training/Education  [ ]                Other (comment):    Frequency/Duration: Patient will be followed by occupational therapy 3 times a week to address goals.  Discharge Recommendations: Skilled Nursing Facility and To Be Determined  Further Equipment Recommendations for Discharge: TBD       SUBJECTIVE:   Patient stated ???I am not using this walker at home.???      OBJECTIVE DATA SUMMARY:       Past Medical History   Diagnosis Date   ??? CAD (coronary artery disease)         see problem list   ??? HTN (hypertension)     ??? PVD (peripheral vascular disease)     ??? DM (diabetes mellitus)     ??? Hyperlipidemia     ??? Sleep apnea         on CPAP   ??? COPD (chronic obstructive pulmonary disease)         mild COPD, recurrent pneumonia, chronic bronchitis (followed by Dr. Leroy Libman)   ??? CKD (chronic kidney disease)         Cr 1.7 in 10/09   ??? CHF (congestive heart failure)     ??? Anemia     ??? Pulmonary hypertension         PASP 60 on echo 06/22/09   ??? Thromboembolus         leg    ??? Thyroid disease     ??? Pneumonia     ??? GERD (gastroesophageal reflux disease)     ??? Chronic pain         back pain   ??? Cataract     ??? GI bleed         multiple bleeding episodes and blood transfusions.  Dr. Monika Salk has followed..  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   ??? Foot ulcer 07/18/2011     Past Surgical History   Procedure Date   ??? Hx orthopaedic         knee replacement   ??? Pr vascular surgery procedure unlist         arterial bypass   ??? Hx pacemaker     ??? Pr cardiac surg procedure unlist          cardiac stent   ??? Hx cataract removal         removed rt eye     Prior Level of Function/Home Situation: Per pt, independent, driving and caring for himself  Home Situation  Home Environment: Private residence  # Steps to Enter: 4   Rails to Enter: Yes  Hand Rails : Bilateral  One/Two Story Residence: Two story  Living Alone: Yes   Support Systems: Child(ren);Home care staff  Patient Expects to be Discharged to:: Private residence  Current DME Used/Available at Home: Dan Humphreys, rolling;Glucometer  Tub or Shower Type: Shower  [X]   Right hand dominant   [ ]   Left hand dominant  Cognitive/Behavioral Status:  Neurologic State: Alert  Orientation Level: Oriented X4  Cognition: Appropriate decision making;Appropriate for age attention/concentration  Perception: Appears intact  Perseveration: No perseveration noted  Safety/Judgement: Insight into deficits;Decreased awareness of need for safety;Decreased awareness of need for assistance  Vision/Perceptual:    Acuity: Impaired near vision;Impaired far vision    Corrective Lenses: Glasses  Coordination:  Coordination: Within functional limits  Fine Motor Skills-Upper: Left Intact;Right Intact    Gross Motor Skills-Upper: Left Intact;Right Intact  Balance:  Sitting: Intact  Standing: Impaired;With support  Standing - Static: Fair;Occassional  Standing - Dynamic : Fair  Strength:  Strength: Generally decreased, functional  Tone & Sensation:  Tone: Normal  Sensation: Intact  Range of Motion:  AROM: Generally decreased, functional  PROM: Generally decreased, functional     Functional Mobility and Transfers for ADLs:  Bed Mobility:     Supine to Sit: Additional time;Assist X1;Minimal assistance;Verbal cues  Transfers:  Sit to Stand: Additional time;Assist X2;Moderate assistance;Safety concerns;Verbal cues (from low surface - decreased strength)                 Toilet Transfer : Total assistance(dependent) (mod A x 2 from standard toilet as he has at home)                 ADL  Assessment:  Feeding: Independent    Oral Facial Hygiene/Grooming: Supervision/set up    Bathing: Moderate assistance (to effectively clean feet and peri area)    Upper Body Dressing: Supervision/set up    Lower Body Dressing: Moderate assistance (to manage socks, shoes and standing balance)    Toileting: Total assistance (for hygiene and clothing)     Cognitive Retraining  Safety/Judgement: Insight into deficits;Decreased awareness of need for safety;Decreased awareness of need for assistance    Pain:     Pain Intensity 1: 7  Activity Tolerance:   Fair  Please refer to the flowsheet for vital signs taken during this treatment.  After treatment:   [X]  Patient left in no apparent distress sitting up in chair in bathroom washing up at sink with RN present  [ ]  Patient left in no apparent distress in bed  [X]  Call bell left within reach  [X]  Nursing notified  [ ]  Caregiver present  [ ]  Bed alarm activated      COMMUNICATION/EDUCATION:   The patient???s plan of care was discussed with: Registered Nurse.  [X]  Home safety education was provided and the patient/caregiver indicated understanding.  [X]  Patient/family have participated as able in goal setting and plan of care.  [X]  Patient/family agree to work toward stated goals and plan of care.  [ ]  Patient understands intent and goals of therapy, but is neutral about his/her participation.  [ ]  Patient is unable to participate in goal setting and plan of care.  This patient???s plan of care is appropriate for delegation to OTA.    Thank you for this referral.  Lucretia Field, OT  Time Calculation: 20 mins

## 2011-08-26 LAB — TYPE + CROSSMATCH
ABO/Rh(D): O POS
Antibody screen: NEGATIVE
Status of unit: TRANSFUSED
Unit division: 0

## 2011-08-26 LAB — IRON PROFILE
Iron % saturation: 10 % — ABNORMAL LOW (ref 20–50)
Iron: 15 ug/dL — ABNORMAL LOW (ref 35–150)
TIBC: 147 ug/dL — ABNORMAL LOW (ref 250–450)

## 2011-08-26 LAB — GLUCOSE, POC
Glucose (POC): 101 mg/dL (ref 75–110)
Glucose (POC): 48 mg/dL — CL (ref 75–110)
Glucose (POC): 66 mg/dL — ABNORMAL LOW (ref 75–110)
Glucose (POC): 71 mg/dL — ABNORMAL LOW (ref 75–110)
Glucose (POC): 73 mg/dL — ABNORMAL LOW (ref 75–110)
Glucose (POC): 175 mg/dL — ABNORMAL HIGH (ref 75–110)
Glucose (POC): 190 mg/dL — ABNORMAL HIGH (ref 75–110)
Glucose (POC): 179 mg/dL — ABNORMAL HIGH (ref 75–110)

## 2011-08-26 LAB — METABOLIC PANEL, BASIC
Anion gap: 6 mmol/L (ref 5–15)
BUN/Creatinine ratio: 14 (ref 12–20)
BUN: 42 MG/DL — ABNORMAL HIGH (ref 6–20)
CO2: 28 MMOL/L (ref 21–32)
Calcium: 7.6 MG/DL — ABNORMAL LOW (ref 8.5–10.1)
Chloride: 100 MMOL/L (ref 97–108)
Creatinine: 2.96 MG/DL — ABNORMAL HIGH (ref 0.45–1.15)
GFR est AA: 26 mL/min/{1.73_m2} — ABNORMAL LOW (ref >60)
GFR est non-AA: 21 mL/min/{1.73_m2} — ABNORMAL LOW (ref >60)
Glucose: 51 MG/DL — ABNORMAL LOW (ref 65–100)
Potassium: 4.5 MMOL/L (ref 3.5–5.1)
Sodium: 134 MMOL/L — ABNORMAL LOW (ref 136–145)

## 2011-08-26 LAB — CBC W/O DIFF
HCT: 25.1 % — ABNORMAL LOW (ref 36.6–50.3)
HGB: 8.2 g/dL — ABNORMAL LOW (ref 12.1–17.0)
MCH: 28.2 PG (ref 26.0–34.0)
MCHC: 32.7 g/dL (ref 30.0–36.5)
MCV: 86.3 FL (ref 80.0–99.0)
PLATELET: 191 10*3/uL (ref 150–400)
RBC: 2.91 M/uL — ABNORMAL LOW (ref 4.10–5.70)
RDW: 17.3 % — ABNORMAL HIGH (ref 11.5–14.5)
WBC: 9.4 10*3/uL (ref 4.1–11.1)

## 2011-08-26 LAB — AMMONIA: Ammonia, plasma: 26 umol/L (ref <32)

## 2011-08-26 LAB — MAGNESIUM: Magnesium: 2.1 MG/DL (ref 1.6–2.4)

## 2011-08-26 LAB — PHOSPHORUS: Phosphorus: 2.7 MG/DL (ref 2.5–4.9)

## 2011-08-26 MED ORDER — INSULIN LISPRO 100 UNIT/ML INJECTION
100 unit/mL | Freq: Three times a day (TID) | SUBCUTANEOUS | Status: DC
Start: 2011-08-26 — End: 2011-08-28
  Administered 2011-08-27 – 2011-08-28 (×5): via SUBCUTANEOUS

## 2011-08-26 MED ORDER — INSULIN GLARGINE 100 UNIT/ML INJECTION
100 unit/mL | Freq: Every day | SUBCUTANEOUS | Status: DC
Start: 2011-08-26 — End: 2011-08-28
  Administered 2011-08-26 – 2011-08-27 (×2): via SUBCUTANEOUS

## 2011-08-26 MED ORDER — SODIUM CHLORIDE 0.9 % IV
INTRAVENOUS | Status: AC
Start: 2011-08-26 — End: 2011-08-27
  Administered 2011-08-26 – 2011-08-27 (×2): via INTRAVENOUS

## 2011-08-26 MED FILL — METOPROLOL TARTRATE 50 MG TAB: 50 mg | ORAL | Qty: 2

## 2011-08-26 MED FILL — GABAPENTIN 300 MG CAP: 300 mg | ORAL | Qty: 1

## 2011-08-26 MED FILL — GLUCOSE 4 GRAM CHEWABLE TAB: 4 gram | ORAL | Qty: 10

## 2011-08-26 MED FILL — FISH OIL 340 MG-1,000 MG CAPSULE: 340-1000 mg | ORAL | Qty: 1

## 2011-08-26 MED FILL — PANTOPRAZOLE 40 MG TAB, DELAYED RELEASE: 40 mg | ORAL | Qty: 1

## 2011-08-26 MED FILL — FERROUS SULFATE 325 MG (65 MG ELEMENTAL IRON) TAB: 325 mg (65 mg iron) | ORAL | Qty: 1

## 2011-08-26 MED FILL — INSULIN GLARGINE 100 UNIT/ML INJECTION: 100 unit/mL | SUBCUTANEOUS | Qty: 0.15

## 2011-08-26 MED FILL — ASPIRIN 81 MG CHEWABLE TAB: 81 mg | ORAL | Qty: 1

## 2011-08-26 MED FILL — LEVOTHYROXINE 150 MCG TAB: 150 mcg | ORAL | Qty: 1

## 2011-08-26 MED FILL — TERAZOSIN 5 MG CAP: 5 mg | ORAL | Qty: 2

## 2011-08-26 MED FILL — HEPARIN (PORCINE) 5,000 UNIT/ML IJ SOLN: 5000 unit/mL | INTRAMUSCULAR | Qty: 1

## 2011-08-26 MED FILL — MISOPROSTOL 200 MCG TAB: 200 mcg | ORAL | Qty: 1

## 2011-08-26 MED FILL — LIPITOR 20 MG TABLET: 20 mg | ORAL | Qty: 2

## 2011-08-26 MED FILL — FAMOTIDINE 20 MG TAB: 20 mg | ORAL | Qty: 1

## 2011-08-26 MED FILL — INSULIN GLARGINE 100 UNIT/ML INJECTION: 100 unit/mL | SUBCUTANEOUS | Qty: 0.3

## 2011-08-26 MED FILL — RANEXA 500 MG TABLET,EXTENDED RELEASE: 500 mg | ORAL | Qty: 1

## 2011-08-26 MED FILL — ISOSORBIDE MONONITRATE SR 30 MG 24 HR TAB: 30 mg | ORAL | Qty: 1

## 2011-08-26 MED FILL — OXYCONTIN 10 MG TABLET,EXTENDED RELEASE: 10 mg | ORAL | Qty: 1

## 2011-08-26 MED FILL — SODIUM CHLORIDE 0.9 % IV: INTRAVENOUS | Qty: 1000

## 2011-08-26 MED FILL — PLAVIX 75 MG TABLET: 75 mg | ORAL | Qty: 1

## 2011-08-26 MED FILL — INSULIN LISPRO 100 UNIT/ML INJECTION: 100 unit/mL | SUBCUTANEOUS | Qty: 1

## 2011-08-26 NOTE — Progress Notes (Signed)
[facility] Progress Note  [facility]   [address] [place], [place] [address]   [phone]      Assessment & Plan:   ARF/CKD  ?? Worse again today  ?? PVR <200 this am  ?? Restarting IVF  ?? Will watch    HTN  ?? Stable    CP/CAD  ?? Medical mgmt    Anemia  ?? EPO + PO Fe       Subjective:   CC: f/u ARF, CKD  HPI: ARF is worse again. The cause for rising Cr is not immediately apparent and it seems he has fairly labile renal function historically.   BJY:NWGNFA SOB or CP  Current Facility-Administered Medications   Medication Dose Route Frequency   ??? insulin lispro (HUMALOG) injection   SubCUTAneous TIDAC   ??? insulin glargine (LANTUS) injection 15 Units  15 Units SubCUTAneous DAILY   ??? 0.9% sodium chloride infusion  75 mL/hr IntraVENous CONTINUOUS   ??? levofloxacin (LEVAQUIN) 750 mg in D5W IVPB  750 mg IntraVENous Q48H   ??? ranolazine ER (RANEXA) tablet 500 mg  500 mg Oral BID   ??? oxyCODONE CR (OXYCONTIN) tablet 10 mg  10 mg Oral Q12H   ??? epoetin alfa (EPOGEN;PROCRIT) injection 20,000 Units  20,000 Units SubCUTAneous Q7D   ??? gabapentin (NEURONTIN) capsule 300 mg  300 mg Oral TID   ??? dextrose (D50W) injection Syrg 12.5-25 g  12.5-25 g IntraVENous PRN   ??? famotidine (PEPCID) tablet 20 mg  20 mg Oral DAILY   ??? aspirin chewable tablet 81 mg  81 mg Oral DAILY   ??? atorvastatin (LIPITOR) tablet 40 mg  40 mg Oral QHS   ??? clopidogrel (PLAVIX) tablet 75 mg  75 mg Oral DAILY   ??? ferrous sulfate tablet 325 mg  325 mg Oral ACB&D   ??? fish oil-omega-3 fatty acids 340-1,000 mg capsule 1 Cap  1 Cap Oral BID   ??? isosorbide mononitrate ER (IMDUR) tablet 30 mg  30 mg Oral DAILY   ??? levothyroxine (SYNTHROID) tablet 150 mcg  150 mcg Oral ACB   ??? metoprolol (LOPRESSOR) tablet 100 mg  100 mg Oral BID   ??? misoprostol (CYTOTEC) tablet 200 mcg  200 mcg Oral BID   ??? nitroglycerin (NITROSTAT) tablet 0.4 mg  0.4 mg SubLINGual PRN   ??? oxyCODONE-acetaminophen (PERCOCET 7.5) 7.5-325 mg  per tablet 1 Tab  1 Tab Oral Q4H PRN   ??? terazosin (HYTRIN) capsule 10 mg  10 mg Oral QHS   ??? acetaminophen (TYLENOL) tablet 650 mg  650 mg Oral Q4H PRN   ??? ondansetron (ZOFRAN) injection 4 mg  4 mg IntraVENous Q4H PRN   ??? heparin (porcine) injection 5,000 Units  5,000 Units SubCUTAneous Q8H   ??? pantoprazole (PROTONIX) tablet 40 mg  40 mg Oral ACB   ??? levalbuterol (XOPENEX) nebulizer soln 0.63 mg/3 ml  0.63 mg Nebulization Q4H PRN   ??? glucose chewable tablet 16 g  4 Tab Oral PRN   ??? glucagon (GLUCAGEN) injection 1 mg  1 mg IntraMUSCular PRN          Objective:     Vitals:  Blood pressure 107/89, pulse 80, temperature 99.3 ??F (37.4 ??C), resp. rate 24, height 5\' 6"  (1.676 m), weight 82.645 kg (182 lb 3.2 oz), SpO2 98.00%.  Temp (24hrs), Avg:98.9 ??F (37.2 ??C), Min:98 ??F (36.7 ??C), Max:99.7 ??F (37.6 ??C)      Intake and Output:  04/01 0700 -  04/01 1859  In: -   Out: 50 [Urine:50]  03/30 1900 - 04/01 0659  In: 1391.7 [P.O.:850; I.V.:250]  Out: 375 [Urine:375]    Physical Exam:               GENERAL ASSESSMENT: NAD  CHEST: CTA  HEART: S1S2  ABDOMEN: Soft,NT, +BS  EXTREMITY: trace to 1+ EDEMA            Data Review      No results found for this basename: ITNL in the last 72 hours   No results found for this basename: CPK:3,CKMB:3,TROIQ:3, in the last 72 hours  Recent Labs   Basename 08/26/11 0510 08/25/11 0230 08/24/11 0530    NA 134* 137 139    K 4.5 4.3 3.7    CL 100 103 103    CO2 28 27 28     BUN 42* 36* 36*    CREA 2.96* 2.28* 1.62*    GLU 51* 123* 59*    PHOS 2.7 2.7 --    MG 2.1 2.2 --    CA 7.6* 7.5* 7.6*    ALB -- -- --    WBC 9.4 10.0 --    HGB 8.2* 7.3* --    HCT 25.1* 22.5* --    PLT 191 189 --      No results found for this basename: INR:3,PTP:3,APTT:3, in the last 72 hours  Needs: urine analysis, urine sodium, protein and creatinine  Lab Results   Component Value Date/Time    Sodium,urine random 23 08/22/2011  3:18 PM    Creatinine,urine random 177.20 08/22/2011  3:18 PM                 [facility]  [address]  [place], [place] [address]  Phone - [phone]  Fax - [phone]

## 2011-08-26 NOTE — Progress Notes (Signed)
OT Note:  Spoke with RN and noted BLEs negative for DVTs.  Attempted to see pt this pm, but pt currently unavailable and at CT.  Will follow-up with OT tomorrow.  Thank you.  Leotis Shames

## 2011-08-26 NOTE — Progress Notes (Signed)
Chart reviewed. Patient is awaiting dopplers to rule out DVT to LE's.  Test not yet done. PT will hold (per NP note) until results of doppler are known, then resume PT if negative.

## 2011-08-26 NOTE — Progress Notes (Addendum)
[facility]  [address] [place], [place] [address]  [phone]      Medical Progress Note      NAME: Travis [name]   DOB:  Sep 28, 1940  MRM:  [id number]    Date/Time: 08/26/2011         Subjective:     Chief Complaint:  Patient was seen and examined by me.  Chart reviewed.  Had hypoglycemia overnight       Objective:       Vitals:       Last 24hrs VS reviewed since prior progress note. Most recent are:    Visit Vitals   Item Reading   ??? BP 143/65   ??? Pulse 99   ??? Temp 99.3 ??F (37.4 ??C)   ??? Resp 24   ??? Ht 5\' 6"  (1.676 m)   ??? Wt 182 lb 3.2 oz   ??? BMI 29.41 kg/m2   ??? SpO2 98%     SpO2 Readings from Last 6 Encounters:   08/26/11 98%   08/13/11 99%   08/13/11 99%   07/24/11 98%   05/25/11 97%   11/13/09 98%    O2 Flow Rate (L/min): 2 l/min       Intake/Output Summary (Last 24 hours) at 08/26/11 0820  Last data filed at 08/26/11 0641   Gross per 24 hour   Intake 1141.7 ml   Output    225 ml   Net  916.7 ml        Exam:     Physical Exam:    Gen:  Well-developed, well-nourished, in no acute distress  HEENT:  Pink conjunctivae, PERRL, hearing intact to voice, moist mucous membranes  Neck:  Supple, without masses, thyroid non-tender  Resp:  No accessory muscle use, clear breath sounds without wheezes rales or rhonchi  Card:  No murmurs, normal S1, S2 without thrills,   Abd:  Soft, non-tender, non-distended, normoactive bowel sounds are present, no palpable organomegaly and no detectable hernias  Musc:  No cyanosis or clubbing.  Legs pain to palpation  Skin:  No rashes or ulcers, skin turgor is good  Neuro:  Cranial nerves 3-12 are grossly intact,  follows commands appropriately  Psych:  Good insight, oriented to person, place and time, alert      Medications Reviewed: (see below)    Lab Data Reviewed: (see below)    ______________________________________________________________________    Medications:     Current Facility-Administered Medications   Medication Dose Route Frequency   ???  insulin lispro (HUMALOG) injection   SubCUTAneous TIDAC   ??? insulin glargine (LANTUS) injection 15 Units  15 Units SubCUTAneous DAILY   ??? levofloxacin (LEVAQUIN) 750 mg in D5W IVPB  750 mg IntraVENous Q48H   ??? ranolazine ER (RANEXA) tablet 500 mg  500 mg Oral BID   ??? oxyCODONE CR (OXYCONTIN) tablet 10 mg  10 mg Oral Q12H   ??? epoetin alfa (EPOGEN;PROCRIT) injection 20,000 Units  20,000 Units SubCUTAneous Q7D   ??? gabapentin (NEURONTIN) capsule 300 mg  300 mg Oral TID   ??? dextrose (D50W) injection Syrg 12.5-25 g  12.5-25 g IntraVENous PRN   ??? famotidine (PEPCID) tablet 20 mg  20 mg Oral DAILY   ??? aspirin chewable tablet 81 mg  81 mg Oral DAILY   ??? atorvastatin (LIPITOR) tablet 40 mg  40 mg Oral QHS   ??? clopidogrel (PLAVIX) tablet 75 mg  75 mg Oral DAILY   ??? ferrous sulfate  tablet 325 mg  325 mg Oral ACB&D   ??? fish oil-omega-3 fatty acids 340-1,000 mg capsule 1 Cap  1 Cap Oral BID   ??? isosorbide mononitrate ER (IMDUR) tablet 30 mg  30 mg Oral DAILY   ??? levothyroxine (SYNTHROID) tablet 150 mcg  150 mcg Oral ACB   ??? metoprolol (LOPRESSOR) tablet 100 mg  100 mg Oral BID   ??? misoprostol (CYTOTEC) tablet 200 mcg  200 mcg Oral BID   ??? nitroglycerin (NITROSTAT) tablet 0.4 mg  0.4 mg SubLINGual PRN   ??? oxyCODONE-acetaminophen (PERCOCET 7.5) 7.5-325 mg per tablet 1 Tab  1 Tab Oral Q4H PRN   ??? terazosin (HYTRIN) capsule 10 mg  10 mg Oral QHS   ??? acetaminophen (TYLENOL) tablet 650 mg  650 mg Oral Q4H PRN   ??? ondansetron (ZOFRAN) injection 4 mg  4 mg IntraVENous Q4H PRN   ??? heparin (porcine) injection 5,000 Units  5,000 Units SubCUTAneous Q8H   ??? pantoprazole (PROTONIX) tablet 40 mg  40 mg Oral ACB   ??? levalbuterol (XOPENEX) nebulizer soln 0.63 mg/3 ml  0.63 mg Nebulization Q4H PRN   ??? glucose chewable tablet 16 g  4 Tab Oral PRN   ??? glucagon (GLUCAGEN) injection 1 mg  1 mg IntraMUSCular PRN          Lab Review:     Recent Labs   Basename 08/26/11 0510 08/25/11 0230    WBC 9.4 10.0    HGB 8.2* 7.3*    HCT 25.1* 22.5*    PLT 191 189      Recent Labs   Basename 08/26/11 0510 08/25/11 0230 08/24/11 0530    NA 134* 137 139    K 4.5 4.3 3.7    CL 100 103 103    CO2 28 27 28     GLU 51* 123* 59*    BUN 42* 36* 36*    CREA 2.96* 2.28* 1.62*    CA 7.6* 7.5* 7.6*    MG 2.1 2.2 --    PHOS 2.7 2.7 --    ALB -- -- --    TBIL -- -- --    SGOT -- -- --    INR -- -- --     Lab Results   Component Value Date/Time    POC GLUCOSE 101 08/26/2011  7:23 AM    POC GLUCOSE 73 08/26/2011  7:05 AM    POC GLUCOSE 71 08/26/2011  6:49 AM    POC GLUCOSE 66 08/26/2011  6:31 AM    POC GLUCOSE 48 08/26/2011  6:29 AM          Assessment / Plan:     Principal Problem:    71 yo hx of CAD, syst CHF EF 40%, DM, HTN, PVD, CKD, presented w/ NSTEMI, ARF    1) Chest pain/NSTEMI: Cards following.  Cont BB, nitro, plavix, ranexa.  Holding cardiac MRI due to ARF.  Management per Cards    2) ARF/CKD: unclear level of CKD.  ARF likely volume related.  Worse today.  Restart low dose IVF.  Holding diuretics.  Renal is following.  Monitor BMP    3) Pneumonia: patchy ASDz seen on admission.  This could be related to interstitial edema.  Will complete a course of levaquin.  Monitor blood Cx    4) DM/hypoglycemia: hypoglycemia likely from ARF.  Will decrease Lantus and SSI.  Monitor    5) Anemia: likely chronic, related to CKD.  Also had a hx of GI  bleed from AVMs.  Now s/p 1u RBCs (given ischemia).  Checking stool blood, iron studies.  Monitor closely    6) PVD: hx of bypass in legs.  No ischemia per Vascular    7) Legs pain: likely combination of diabetic neuropathy and PVD.  Will get LE dopplers to r/o DVT    Total time spent with patient: 41 Minutes                  Care Plan discussed with: Patient    Discussed:  Care Plan    Prophylaxis:  Hep SQ    Disposition:  Home w/Family           ___________________________________________________    Attending Physician: Twanna Hy. Kellye Mizner, MD

## 2011-08-26 NOTE — Progress Notes (Signed)
BG rechecked @ 71; will recheck in 15 minutes.

## 2011-08-26 NOTE — Progress Notes (Signed)
Bedside and Verbal shift change report given to Monica Jamerson (oncoming nurse) by Season Astacio (offgoing nurse).  Report given with SBAR.

## 2011-08-26 NOTE — Progress Notes (Signed)
OT Note:  Chart reviewed. Patient is awaiting dopplers to rule out DVT to LE's. Test not yet done. OT will hold (per NP note) until results of doppler are known, then resume OT if negative.  Thank you.  Lauren Charlane Ferretti, OTR/L, CBIS

## 2011-08-26 NOTE — Progress Notes (Signed)
Pt had incontinent episode; bladder scanned for ; pt then voided 100 ml amber concentrated urine in urinal. Will continue to monitor output.

## 2011-08-26 NOTE — Progress Notes (Signed)
Patient very lethargic. VSS and blood glucose 175. Per family patient seems more confused. Will page Dr. Drucilla Schmidt and continue to monitor.

## 2011-08-26 NOTE — Progress Notes (Addendum)
Cardiology Progress Note         NAME:  Travis [name]   DOB:   1940-11-12   MRN:   [id number]     Assessment/Plan:   1. NonSTEMI : EF 40%.  Cont medical mgt for CAD although PCI CTO remains a consideration. Continue ranexa, plavix, asa, BB, statin. Will plan for the MRI later, once he is recovered from ARF.  2. Dyslipidemia: cont statin   3. HTN   4. abetes : hypoglycemia this am  , lantus decreased  5 Renal insufficiency:  Worse today, unclear level of CKD. ARF likely volume related. Worse today. Restart low dose IVF. Holding diuretics. Renal is following. Monitor BMP  6. PVD/leg pain : hx of bypass in legs. No ischemia per Vascular.  Dopplers today.  If negative resume PT  7. Depressed  8.  Deconditioned:  PT if venous US negative        Cardiology attending:  Pt seen and examined.    As noted above. Doubt there will be much to do coronary wise    [name], MD    Subjective:   Travis [name] is a 71 y.o. AA male  With a PMH of  diabetes poorly controlled, hypertension, hyperlipidemia, coronary artery disease and OSA and peripheral artery disease. Symptoms include: chest pressure/discomfort Cardiac risk factors: family history, dyslipidemia, diabetes mellitus, obesity, sedentary life style, male gender, hypertension. He presented to the ER after having used NTG for chest pain. Marland Kitchen He has severe CAD based on recent cardiac catheterization. He was recently discharged and was scheduled to see Dr. Welton Flakes as outpatient. He was last admitted with non-ST elevated myocardial infarction . Cath was done, but CAD was not amenable to PCI. Medical management was recommended at that time.   Of note his son recently has been diagnosed with AML and is under treatment. The patient is under stress regarding this and may have less interest in taking care of himself.        Cardiac ROS: Patient denies any exertional chest pain, dyspnea, palpitations, syncope, orthopnea, edema or paroxysmal nocturnal  dyspnea.  .          Review of Systems: + L foot pain.  Wants to get OOB. No nausea, indigestion, vomiting, cough, sputum. No bleeding. Appetite good.               Objective:     BP 107/89   Pulse 80   Temp 99.3 ??F (37.4 ??C)   Resp 24   Ht 5\' 6"  (1.676 m)   Wt 82.645 kg (182 lb 3.2 oz)   BMI 29.41 kg/m2   SpO2 98% O2 Flow Rate (L/min): 2 l/min O2 Device: Nasal cannula    Temp (24hrs), Avg:98.9 ??F (37.2 ??C), Min:98 ??F (36.7 ??C), Max:99.7 ??F (37.6 ??C)           03/30 1900 - 04/01 0659  In: 1391.7 [P.O.:850; I.V.:250]  Out: 375 [Urine:375]  TELE: SR      General: AAOx3 cooperative, no acute distress.  HEENT: Atraumatic. Pink and moist.  Anicteric sclerae.  Neck : Supple, no thyromegaly.   Lungs: CTA bilaterally. No wheezing/rhonchi/rales.  Heart: Regular rhythm, no murmur, no rubs, no gallops. No JVD. No carotid bruits.   Abdomen: Soft, non-distended, non-tender. + Bowel sounds. No bruits.  Extremities: Trace edema, Dressing L foot.  No edema, no clubbing, no cyanosis. No calf tenderness  Neurologic: Grossly intact.  Alert and oriented X 3.  No  acute neurological distress.   Psych: Fair insight. Not anxious, appears depressed        Care Plan discussed with:    Comments   Patient y    Family      RN y    Comptroller:  y Dr Drucilla Schmidt       Data Review:     No results found for this basename: ITNL in the last 72 hours   No results found for this basename: CPK:3,CKMB:3,TROIQ:3, in the last 72 hours  Recent Labs   Basename 08/26/11 0510 08/25/11 0230 08/24/11 0530    NA 134* 137 139    K 4.5 4.3 3.7    CL 100 103 103    CO2 28 27 28     BUN 42* 36* 36*    CREA 2.96* 2.28* 1.62*    GLU 51* 123* 59*    PHOS 2.7 2.7 --    MG 2.1 2.2 --    ALB -- -- --    WBC 9.4 10.0 --    HGB 8.2* 7.3* --    HCT 25.1* 22.5* --    PLT 191 189 --     No results found for this basename: INR:3,PTP:3,APTT:3, in the last 72 hours    Medications reviewed  Current Facility-Administered Medications   Medication Dose Route  Frequency   ??? insulin lispro (HUMALOG) injection   SubCUTAneous TIDAC   ??? insulin glargine (LANTUS) injection 15 Units  15 Units SubCUTAneous DAILY   ??? 0.9% sodium chloride infusion  75 mL/hr IntraVENous CONTINUOUS   ??? levofloxacin (LEVAQUIN) 750 mg in D5W IVPB  750 mg IntraVENous Q48H   ??? ranolazine ER (RANEXA) tablet 500 mg  500 mg Oral BID   ??? oxyCODONE CR (OXYCONTIN) tablet 10 mg  10 mg Oral Q12H   ??? epoetin alfa (EPOGEN;PROCRIT) injection 20,000 Units  20,000 Units SubCUTAneous Q7D   ??? gabapentin (NEURONTIN) capsule 300 mg  300 mg Oral TID   ??? dextrose (D50W) injection Syrg 12.5-25 g  12.5-25 g IntraVENous PRN   ??? famotidine (PEPCID) tablet 20 mg  20 mg Oral DAILY   ??? aspirin chewable tablet 81 mg  81 mg Oral DAILY   ??? atorvastatin (LIPITOR) tablet 40 mg  40 mg Oral QHS   ??? clopidogrel (PLAVIX) tablet 75 mg  75 mg Oral DAILY   ??? ferrous sulfate tablet 325 mg  325 mg Oral ACB&D   ??? fish oil-omega-3 fatty acids 340-1,000 mg capsule 1 Cap  1 Cap Oral BID   ??? isosorbide mononitrate ER (IMDUR) tablet 30 mg  30 mg Oral DAILY   ??? levothyroxine (SYNTHROID) tablet 150 mcg  150 mcg Oral ACB   ??? metoprolol (LOPRESSOR) tablet 100 mg  100 mg Oral BID   ??? misoprostol (CYTOTEC) tablet 200 mcg  200 mcg Oral BID   ??? nitroglycerin (NITROSTAT) tablet 0.4 mg  0.4 mg SubLINGual PRN   ??? oxyCODONE-acetaminophen (PERCOCET 7.5) 7.5-325 mg per tablet 1 Tab  1 Tab Oral Q4H PRN   ??? terazosin (HYTRIN) capsule 10 mg  10 mg Oral QHS   ??? acetaminophen (TYLENOL) tablet 650 mg  650 mg Oral Q4H PRN   ??? ondansetron (ZOFRAN) injection 4 mg  4 mg IntraVENous Q4H PRN   ??? heparin (porcine) injection 5,000 Units  5,000 Units SubCUTAneous Q8H   ??? pantoprazole (PROTONIX) tablet 40  mg  40 mg Oral ACB   ??? levalbuterol (XOPENEX) nebulizer soln 0.63 mg/3 ml  0.63 mg Nebulization Q4H PRN   ??? glucose chewable tablet 16 g  4 Tab Oral PRN   ??? glucagon (GLUCAGEN) injection 1 mg  1 mg IntraMUSCular PRN         Stephanie A. Vilma Prader, NP

## 2011-08-26 NOTE — Progress Notes (Signed)
Cardiopulmonary Rehab:  Follow-up visit with patient and family.  Briefly reviewed new medication Ranexa and patient's daughter given printed teaching handout on Ranexa.  Will continue to follow.

## 2011-08-26 NOTE — Progress Notes (Signed)
Bedside and Verbal shift change report given to Jodi RN (oncoming nurse) by Monica RN (offgoing nurse).  Report given with SBAR, Kardex and MAR.

## 2011-08-26 NOTE — Progress Notes (Signed)
Blood glucose 48; rechecked @ 66. Pt given apple juice; will recheck in 15 min.

## 2011-08-27 LAB — GLUCOSE, POC
Glucose (POC): 118 mg/dL — ABNORMAL HIGH (ref 75–110)
Glucose (POC): 170 mg/dL — ABNORMAL HIGH (ref 75–110)
Glucose (POC): 195 mg/dL — ABNORMAL HIGH (ref 75–110)
Glucose (POC): 272 mg/dL — ABNORMAL HIGH (ref 75–110)
Glucose (POC): 231 mg/dL — ABNORMAL HIGH (ref 75–110)

## 2011-08-27 LAB — METABOLIC PANEL, BASIC
Anion gap: 10 mmol/L (ref 5–15)
BUN/Creatinine ratio: 13 (ref 12–20)
BUN: 46 MG/DL — ABNORMAL HIGH (ref 6–20)
CO2: 24 MMOL/L (ref 21–32)
Calcium: 7.7 MG/DL — ABNORMAL LOW (ref 8.5–10.1)
Chloride: 99 MMOL/L (ref 97–108)
Creatinine: 3.44 MG/DL — ABNORMAL HIGH (ref 0.45–1.15)
GFR est AA: 21 mL/min/{1.73_m2} — ABNORMAL LOW (ref >60)
GFR est non-AA: 18 mL/min/{1.73_m2} — ABNORMAL LOW (ref >60)
Glucose: 92 MG/DL (ref 65–100)
Potassium: 4.6 MMOL/L (ref 3.5–5.1)
Sodium: 133 MMOL/L — ABNORMAL LOW (ref 136–145)

## 2011-08-27 LAB — CBC W/O DIFF
HCT: 23.4 % — ABNORMAL LOW (ref 36.6–50.3)
HGB: 7.8 g/dL — ABNORMAL LOW (ref 12.1–17.0)
MCH: 28.7 PG (ref 26.0–34.0)
MCHC: 33.3 g/dL (ref 30.0–36.5)
MCV: 86 FL (ref 80.0–99.0)
PLATELET: 190 10*3/uL (ref 150–400)
RBC: 2.72 M/uL — ABNORMAL LOW (ref 4.10–5.70)
RDW: 17.6 % — ABNORMAL HIGH (ref 11.5–14.5)
WBC: 8.3 10*3/uL (ref 4.1–11.1)

## 2011-08-27 LAB — CULTURE, BLOOD: Culture result:: NO GROWTH

## 2011-08-27 LAB — URINALYSIS W/ RFLX MICROSCOPIC
Bilirubin: NEGATIVE
Blood: NEGATIVE
Glucose: NEGATIVE MG/DL
Ketone: NEGATIVE MG/DL
Leukocyte Esterase: NEGATIVE
Nitrites: NEGATIVE
Protein: 30 MG/DL — AB
Specific gravity: 1.017 (ref 1.003–1.030)
Urobilinogen: 1 EU/DL (ref 0.2–1.0)
pH (UA): 5 (ref 5.0–8.0)

## 2011-08-27 LAB — TSH 3RD GENERATION: TSH: 2.32 u[IU]/mL (ref 0.36–3.74)

## 2011-08-27 LAB — MAGNESIUM: Magnesium: 2.1 MG/DL (ref 1.6–2.4)

## 2011-08-27 MED FILL — MISOPROSTOL 200 MCG TAB: 200 mcg | ORAL | Qty: 1

## 2011-08-27 MED FILL — FERROUS SULFATE 325 MG (65 MG ELEMENTAL IRON) TAB: 325 mg (65 mg iron) | ORAL | Qty: 1

## 2011-08-27 MED FILL — INSULIN GLARGINE 100 UNIT/ML INJECTION: 100 unit/mL | SUBCUTANEOUS | Qty: 0.15

## 2011-08-27 MED FILL — GABAPENTIN 300 MG CAP: 300 mg | ORAL | Qty: 1

## 2011-08-27 MED FILL — ISOSORBIDE MONONITRATE SR 30 MG 24 HR TAB: 30 mg | ORAL | Qty: 1

## 2011-08-27 MED FILL — HEPARIN (PORCINE) 5,000 UNIT/ML IJ SOLN: 5000 unit/mL | INTRAMUSCULAR | Qty: 1

## 2011-08-27 MED FILL — INSULIN LISPRO 100 UNIT/ML INJECTION: 100 unit/mL | SUBCUTANEOUS | Qty: 1

## 2011-08-27 MED FILL — PANTOPRAZOLE 40 MG TAB, DELAYED RELEASE: 40 mg | ORAL | Qty: 1

## 2011-08-27 MED FILL — FISH OIL 340 MG-1,000 MG CAPSULE: 340-1000 mg | ORAL | Qty: 1

## 2011-08-27 MED FILL — LIPITOR 20 MG TABLET: 20 mg | ORAL | Qty: 2

## 2011-08-27 MED FILL — METOPROLOL TARTRATE 50 MG TAB: 50 mg | ORAL | Qty: 2

## 2011-08-27 MED FILL — PLAVIX 75 MG TABLET: 75 mg | ORAL | Qty: 1

## 2011-08-27 MED FILL — LEVOTHYROXINE 150 MCG TAB: 150 mcg | ORAL | Qty: 1

## 2011-08-27 MED FILL — TERAZOSIN 5 MG CAP: 5 mg | ORAL | Qty: 2

## 2011-08-27 MED FILL — ASPIRIN 81 MG CHEWABLE TAB: 81 mg | ORAL | Qty: 1

## 2011-08-27 MED FILL — FAMOTIDINE 20 MG TAB: 20 mg | ORAL | Qty: 1

## 2011-08-27 MED FILL — RANEXA 500 MG TABLET,EXTENDED RELEASE: 500 mg | ORAL | Qty: 1

## 2011-08-27 NOTE — Progress Notes (Addendum)
Cardiology Progress Note         NAME:  Travis [name]   DOB:   [birth year]   MRN:   [id number]     Assessment/Plan:   1. NonSTEMI : EF 40%.  Cont medical mgt for CAD although PCI CTO remains a consideration. Continue ranexa, plavix, asa, BB, Imdur, statin. Will plan for cardiac MRI later, once he is recovered from ARF.  Acute renal failure with continually rising Cr is a contraindication for Gd administration.   2. Dyslipidemia: cont statin   3. HTN   4. diabetes :insulin being adjusted  5 Renal insufficiency: creat 3.44 this am. Unclear level of CKD. ARF likely volume related.  low dose IVF. Holding diuretics. Renal is following. Monitor BMP  6. PVD/leg pain : Dopplers negative.    7. Depressed  8.  Deconditioned:  PT   9.  Confusion:  Still confused this am.  Pain meds being adjusted. HEAD CT negative.   10.  abnml sensing of PPM:  Will have pacer interrogated today.         Cardiology attending:  Pt seen and examined.    Worsening azotemia, unclear cause. No ssx of HF. No plans for MRI until renal function improves significantly.    [name], MD        Subjective:   Travis [name] is a 71 y.o. AA male  With a PMH of  diabetes poorly controlled, hypertension, hyperlipidemia, coronary artery disease and OSA and peripheral artery disease. Symptoms include: chest pressure/discomfort Cardiac risk factors: family history, dyslipidemia, diabetes mellitus, obesity, sedentary life style, male gender, hypertension. He presented to the ER after having used NTG for chest pain. Marland Kitchen He has severe CAD based on recent cardiac catheterization. He was recently discharged and was scheduled to see Dr. Welton Flakes as outpatient. He was last admitted with non-ST elevated myocardial infarction . Cath was done, but CAD was not amenable to PCI. Medical management was recommended at that time.   Of note his son recently has been diagnosed with AML and is under treatment. The patient is under stress regarding  this and may have less interest in taking care of himself.        Cardiac ROS:  Patient denies any exertional chest pain, dyspnea, palpitations, syncope, orthopnea, edema or paroxysmal nocturnal dyspnea.  .          Review of Systems: Confused yesterday afternoon: HEAD CT negative.  No nausea, indigestion, vomiting, cough, sputum. No bleeding. Appetite fair.               Objective:     BP 135/62   Pulse 60   Temp 99.6 ??F (37.6 ??C)   Resp 20   Ht 5\' 6"  (1.676 m)   Wt 82.645 kg (182 lb 3.2 oz)   BMI 29.41 kg/m2   SpO2 98% O2 Flow Rate (L/min): 2 l/min O2 Device: Nasal cannula    Temp (24hrs), Avg:99.6 ??F (37.6 ??C), Min:98.7 ??F (37.1 ??C), Max:100.2 ??F (37.9 ??C)           03/31 1900 - 04/02 4098  In: 1856.3 [P.O.:270; I.V.:1586.3]  Out: 350 [Urine:350]  TELE: SR    V paced 11:30 inappropriate sensing noted    General: AAOx2 cooperative, no acute distress.  HEENT: Atraumatic. Pink and moist.  Anicteric sclerae.  Neck : Supple, no thyromegaly.   Lungs: CTA bilaterally. No wheezing/rhonchi/rales.  Heart: Regular rhythm, no murmur, no rubs, no gallops. No JVD. No carotid  bruits.   Abdomen: Soft, non-distended, non-tender. + Bowel sounds. No bruits.  Extremities: Trace edema, Dressing L foot.  No edema, no clubbing, no cyanosis. No calf tenderness  Neurologic: Grossly intact.  Alert and oriented X 2.  No acute neurological distress.   Psych: Limited  insight. Not anxious, appears depressed        Care Plan discussed with:    Comments   Patient y    Family      RN y    Comptroller:          Data Review:     No results found for this basename: ITNL in the last 72 hours   No results found for this basename: CPK:3,CKMB:3,TROIQ:3, in the last 72 hours  Recent Labs   Basename 08/27/11 0510 08/26/11 0510 08/25/11 0230    NA 133* 134* 137    K 4.6 4.5 4.3    CL 99 100 103    CO2 24 28 27     BUN 46* 42* 36*    CREA 3.44* 2.96* 2.28*    GLU 92 51* 123*    PHOS -- 2.7 2.7    MG 2.1 2.1 2.2    ALB -- --  --    WBC 8.3 9.4 10.0    HGB 7.8* 8.2* 7.3*    HCT 23.4* 25.1* 22.5*    PLT 190 191 189     No results found for this basename: INR:3,PTP:3,APTT:3, in the last 72 hours    Medications reviewed  Current Facility-Administered Medications   Medication Dose Route Frequency   ??? insulin lispro (HUMALOG) injection   SubCUTAneous TIDAC   ??? insulin glargine (LANTUS) injection 15 Units  15 Units SubCUTAneous DAILY   ??? 0.9% sodium chloride infusion  75 mL/hr IntraVENous CONTINUOUS   ??? ranolazine ER (RANEXA) tablet 500 mg  500 mg Oral BID   ??? epoetin alfa (EPOGEN;PROCRIT) injection 20,000 Units  20,000 Units SubCUTAneous Q7D   ??? gabapentin (NEURONTIN) capsule 300 mg  300 mg Oral TID   ??? dextrose (D50W) injection Syrg 12.5-25 g  12.5-25 g IntraVENous PRN   ??? famotidine (PEPCID) tablet 20 mg  20 mg Oral DAILY   ??? aspirin chewable tablet 81 mg  81 mg Oral DAILY   ??? atorvastatin (LIPITOR) tablet 40 mg  40 mg Oral QHS   ??? clopidogrel (PLAVIX) tablet 75 mg  75 mg Oral DAILY   ??? ferrous sulfate tablet 325 mg  325 mg Oral ACB&D   ??? fish oil-omega-3 fatty acids 340-1,000 mg capsule 1 Cap  1 Cap Oral BID   ??? isosorbide mononitrate ER (IMDUR) tablet 30 mg  30 mg Oral DAILY   ??? levothyroxine (SYNTHROID) tablet 150 mcg  150 mcg Oral ACB   ??? metoprolol (LOPRESSOR) tablet 100 mg  100 mg Oral BID   ??? misoprostol (CYTOTEC) tablet 200 mcg  200 mcg Oral BID   ??? nitroglycerin (NITROSTAT) tablet 0.4 mg  0.4 mg SubLINGual PRN   ??? oxyCODONE-acetaminophen (PERCOCET 7.5) 7.5-325 mg per tablet 1 Tab  1 Tab Oral Q4H PRN   ??? terazosin (HYTRIN) capsule 10 mg  10 mg Oral QHS   ??? acetaminophen (TYLENOL) tablet 650 mg  650 mg Oral Q4H PRN   ??? ondansetron (ZOFRAN) injection 4 mg  4 mg IntraVENous Q4H PRN   ??? heparin (porcine) injection 5,000 Units  5,000 Units SubCUTAneous Q8H   ??? pantoprazole (PROTONIX) tablet 40 mg  40 mg Oral ACB   ??? levalbuterol (XOPENEX) nebulizer soln 0.63 mg/3 ml  0.63 mg Nebulization Q4H PRN   ??? glucose chewable tablet 16 g  4 Tab  Oral PRN   ??? glucagon (GLUCAGEN) injection 1 mg  1 mg IntraMUSCular PRN         Stephanie A. Vilma Prader, NP

## 2011-08-27 NOTE — Other (Signed)
Interdisciplinary team rounds were held 08/27/2011 with the following team members:Care Management, Nursing, Nurse Practitioner, Nutrition and Pharmacy.Plan of care discussed. See clinical pathway and/or care plan for interventions and desired outcomes. Plan is for SNF when d/c. Ames Dura, MSW

## 2011-08-27 NOTE — Progress Notes (Signed)
Bedside and Verbal shift change report given to Kristle RN (oncoming nurse) by Monica RN (offgoing nurse).  Report given with SBAR, Kardex and MAR.

## 2011-08-27 NOTE — Progress Notes (Signed)
Bedside and Verbal shift change report given to Heather A.,RN (oncoming nurse) by Kristle,RN (offgoing nurse).  Report given with SBAR, Kardex, Intake/Output, MAR, Accordion and Recent Results.

## 2011-08-27 NOTE — Progress Notes (Signed)
[facility] Progress Note  [facility]   [address] [place], [place] [address]   [phone]      Assessment & Plan:   ARF/CKD  ?? Continues to worsen  ?? Unclear cause. Can't biopsy due to plavix  ?? Check UA, urine lytes, Upcr, Ueos  ?? Place foley. Monitor output  ?? May need RRT    AMS  ?? Probably due to gabapentin in the setting of declining GFR  ?? Stop gabapentin    HTN  ?? Stable    CP/CAD  ?? Medical mgmt for now    Anemia  ?? EPO + PO Fe       Subjective:   CC: f/u ARF, CKD  HPI: ARF is worsening. He is confused and more somnolent. He remains on EPO and iron for anemia. He is on alpha blockade for BPH and PVRs have not been markedly elevated.   ROS: No SOB/CP/N/V  Current Facility-Administered Medications   Medication Dose Route Frequency   ??? insulin lispro (HUMALOG) injection   SubCUTAneous TIDAC   ??? insulin glargine (LANTUS) injection 15 Units  15 Units SubCUTAneous DAILY   ??? 0.9% sodium chloride infusion  75 mL/hr IntraVENous CONTINUOUS   ??? ranolazine ER (RANEXA) tablet 500 mg  500 mg Oral BID   ??? epoetin alfa (EPOGEN;PROCRIT) injection 20,000 Units  20,000 Units SubCUTAneous Q7D   ??? dextrose (D50W) injection Syrg 12.5-25 g  12.5-25 g IntraVENous PRN   ??? famotidine (PEPCID) tablet 20 mg  20 mg Oral DAILY   ??? aspirin chewable tablet 81 mg  81 mg Oral DAILY   ??? atorvastatin (LIPITOR) tablet 40 mg  40 mg Oral QHS   ??? clopidogrel (PLAVIX) tablet 75 mg  75 mg Oral DAILY   ??? ferrous sulfate tablet 325 mg  325 mg Oral ACB&D   ??? fish oil-omega-3 fatty acids 340-1,000 mg capsule 1 Cap  1 Cap Oral BID   ??? isosorbide mononitrate ER (IMDUR) tablet 30 mg  30 mg Oral DAILY   ??? levothyroxine (SYNTHROID) tablet 150 mcg  150 mcg Oral ACB   ??? metoprolol (LOPRESSOR) tablet 100 mg  100 mg Oral BID   ??? misoprostol (CYTOTEC) tablet 200 mcg  200 mcg Oral BID   ??? nitroglycerin (NITROSTAT) tablet 0.4 mg  0.4 mg SubLINGual PRN   ??? oxyCODONE-acetaminophen (PERCOCET 7.5)  7.5-325 mg per tablet 1 Tab  1 Tab Oral Q4H PRN   ??? terazosin (HYTRIN) capsule 10 mg  10 mg Oral QHS   ??? acetaminophen (TYLENOL) tablet 650 mg  650 mg Oral Q4H PRN   ??? ondansetron (ZOFRAN) injection 4 mg  4 mg IntraVENous Q4H PRN   ??? heparin (porcine) injection 5,000 Units  5,000 Units SubCUTAneous Q8H   ??? pantoprazole (PROTONIX) tablet 40 mg  40 mg Oral ACB   ??? levalbuterol (XOPENEX) nebulizer soln 0.63 mg/3 ml  0.63 mg Nebulization Q4H PRN   ??? glucose chewable tablet 16 g  4 Tab Oral PRN   ??? glucagon (GLUCAGEN) injection 1 mg  1 mg IntraMUSCular PRN          Objective:     Vitals:  Blood pressure 125/50, pulse 60, temperature 99.6 ??F (37.6 ??C), resp. rate 20, height 5\' 6"  (1.676 m), weight 82.645 kg (182 lb 3.2 oz), SpO2 98.00%.  Temp (24hrs), Avg:99.7 ??F (37.6 ??C), Min:99.2 ??F (37.3 ??C), Max:100.2 ??F (37.9 ??C)      Intake and Output:  03/31 1900 - 04/02 0659  In: 1856.3 [P.O.:270; I.V.:1586.3]  Out: 350 [Urine:350]    Physical Exam:               GENERAL ASSESSMENT: drowsy, arousable, confused  CHEST: CTA  HEART: S1S2  ABDOMEN: Soft,NT, +BS  EXTREMITY: trace to 1+ EDEMA            Data Review      No results found for this basename: ITNL in the last 72 hours   No results found for this basename: CPK:3,CKMB:3,TROIQ:3, in the last 72 hours  Recent Labs   Basename 08/27/11 0510 08/26/11 0510 08/25/11 0230    NA 133* 134* 137    K 4.6 4.5 4.3    CL 99 100 103    CO2 24 28 27     BUN 46* 42* 36*    CREA 3.44* 2.96* 2.28*    GLU 92 51* 123*    PHOS -- 2.7 2.7    MG 2.1 2.1 2.2    CA 7.7* 7.6* 7.5*    ALB -- -- --    WBC 8.3 9.4 10.0    HGB 7.8* 8.2* 7.3*    HCT 23.4* 25.1* 22.5*    PLT 190 191 189      No results found for this basename: INR:3,PTP:3,APTT:3, in the last 72 hours  Needs: urine analysis, urine sodium, protein and creatinine  Lab Results   Component Value Date/Time    Sodium,urine random 23 08/22/2011  3:18 PM    Creatinine,urine random 177.20 08/22/2011  3:18 PM                 [facility]  [address]  [place], [place] [address]  Phone - [phone]  Fax - [phone]

## 2011-08-27 NOTE — Progress Notes (Signed)
Problem: Self Care Deficits Care Plan (Adult)  Goal: *Acute Goals and Plan of Care (Insert Text)  Occupational Therapy Goals  Initiated 08/25/2011  1. Patient will perform bathing with supervision/set-up within 7 day(s).  2. Patient will perform lower body dressing with supervision/set-up within 7 day(s).  3. Patient will perform toilet transfers with minimal assistance/contact guard assist within 7 day(s).  4. Patient will perform all aspects of toileting with supervision/set-up within 7 day(s).  5. Patient will participate in upper extremity therapeutic exercise/activities with modified independence for 10 minutes within 7 day(s).       OCCUPATIONAL THERAPY TREATMENT  Patient: Travis [name] (71 y.o. male)  Date: 08/27/2011  Diagnosis: Chest pain, unspecified  Chest pain, unspecified Chest pain, unspecified       Precautions: Fall      ASSESSMENT:   Pt with limited activity tolerance today with c/o dizziness seated EOB.  BP 108/42 in sitting and 101/49 in supine.  Pt moving slow and required 25 minutes to go from supine to sit, scoot to EOB and then up to [facility], and then sit to supine.  Pt is following commands, but with slow processing and motor planning.  Continue to recommend skilled OT to increase his safety and independence with all functional tasks.  Progression toward goals:  [ ]        Improving appropriately and progressing toward goals  [X]        Improving slowly and progressing toward goals  [ ]        Not making progress toward goals and plan of care will be adjusted       PLAN:   Patient continues to benefit from skilled intervention to address the above impairments.  Continue treatment per established plan of care.  Discharge Recommendations:  Skilled Nursing Facility and To Be Determined  Further Equipment Recommendations for Discharge:  TBD       SUBJECTIVE:   Patient stated ???I am feeling dizzy.???      OBJECTIVE DATA SUMMARY:   Cognitive/Behavioral Status:  Neurologic State: Alert  Orientation Level:  Oriented to person;Oriented to place;Oriented to time;Disoriented to situation  Cognition: Follows commands (but with slow processing and motor planning)  Perception: Appears intact  Perseveration: No perseveration noted  Safety/Judgement: Awareness of environment;Decreased awareness of need for assistance;Decreased awareness of need for safety;Decreased insight into deficits  Functional Mobility and Transfers for ADLs: Pt moving slow and required 25 minutes to go from supine to sit, scoot to EOB and then up to [facility], and then sit to supine.  Pt is following commands, but with slow processing and motor planning.   Bed Mobility:  Rolling: Supervision;Verbal cues (using bed rails)  Supine to Sit: Additional time;Assist X1;Verbal cues;Moderate assistance (for upperbody and LLE due to decreased strength)  Sit to Supine: Additional time;Assist X1;Minimum assistance;Verbal cues (for LEs)     Balance:  Sitting: Intact  ADL Intervention:  Cognitive Retraining  Safety/Judgement: Awareness of environment;Decreased awareness of need for assistance;Decreased awareness of need for safety;Decreased insight into deficits    Pain:  Pain Scale 1: Visual  Pain Intensity 1: 0     Activity Tolerance:   Fair  Please refer to the flowsheet for vital signs taken during this treatment.  After treatment:   [ ]  Patient left in no apparent distress sitting up in chair  [X]  Patient left in no apparent distress in bed  [X]  Call bell left within reach  [X]  Nursing notified  [ ]  Caregiver present  [X]   Bed alarm activated      COMMUNICATION/COLLABORATION:   The patient???s plan of care was discussed with: Registered Nurse    Lucretia Field, OT  Time Calculation: 27 mins

## 2011-08-27 NOTE — Progress Notes (Signed)
Problem: Unstable Angina/NSTEMI: Discharge Outcomes  Goal: *Hemodynamically stable  Outcome: Not Progressing Towards Goal  Blood work not within normal limits.   Goal: *Verbalizes name, dosage, time, side effects, and number of days to continue medications  Outcome: Not Progressing Towards Goal  Poor historian

## 2011-08-27 NOTE — Procedures (Signed)
[facility]  *** FINAL REPORT ***    Name: Travis [name], Travis [name]  MRN: [id number]  DOB: [birth year]  Visit: [id number]  Date: 26 Aug 2011    TYPE OF TEST: Peripheral Venous Testing    REASON FOR TEST  Pain in limb    Right Leg:-  Deep venous thrombosis:           No  Superficial venous thrombosis:    No  Deep venous insufficiency:        No  Superficial venous insufficiency: No    Left Leg:-  Deep venous thrombosis:           No  Superficial venous thrombosis:    No  Deep venous insufficiency:        No  Superficial venous insufficiency: No      INTERPRETATION/FINDINGS  Right leg :  1. Deep vein(s) visualized include the common femoral, mid femoral and   popliteal(fossa) veins.  2. No evidence of deep venous thrombosis detected in the veins  visualized.  3. No evidence of superficial thrombosis detected.  Left leg :  1. Deep vein(s) visualized include the common femoral, mid femoral and   popliteal(fossa) veins.  2. No evidence of deep venous thrombosis detected in the veins  visualized.  3. No evidence of superficial thrombosis detected.    ADDITIONAL COMMENTS  Right GSV has been harvested for a  graft.  Left enlarged inguinal node is noted.    I have personally reviewed the data relevant to the interpretation of  this  study.    TECHNOLOGIST:  Nile Riggs, RDCS, RVT    PHYSICIAN:  Electronically signed by:  Dalene Carrow. Sheliah Hatch, MD  08/27/2011 08:41 AM

## 2011-08-27 NOTE — Progress Notes (Signed)
Spoke to Travis [name]'s dtr [name] on the phone.  She stated that Travis [name] was scheduled to have sutures from toe amputation removed tomorrow 08/28/11.

## 2011-08-27 NOTE — Progress Notes (Signed)
[facility]  [address] [place], [place] [address]  [phone]      Medical Progress Note      NAME: Travis [name]   DOB:  Sep 16, 1940  MRM:  [id number]    Date/Time: 08/27/2011         Subjective:     Chief Complaint:  Patient was seen and examined by me.  Chart reviewed.  Still confused this AM       Objective:       Vitals:       Last 24hrs VS reviewed since prior progress note. Most recent are:    Visit Vitals   Item Reading   ??? BP 141/58   ??? Pulse 60   ??? Temp 99.6 ??F (37.6 ??C)   ??? Resp 20   ??? Ht 5\' 6"  (1.676 m)   ??? Wt 182 lb 3.2 oz   ??? BMI 29.41 kg/m2   ??? SpO2 96%     SpO2 Readings from Last 6 Encounters:   08/27/11 96%   08/13/11 99%   08/13/11 99%   07/24/11 98%   05/25/11 97%   11/13/09 98%    O2 Flow Rate (L/min): 2 l/min       Intake/Output Summary (Last 24 hours) at 08/27/11 0842  Last data filed at 08/27/11 2130   Gross per 24 hour   Intake 1586.25 ml   Output    250 ml   Net 1336.25 ml        Exam:     Physical Exam:    Gen:  Well-developed, well-nourished, in no acute distress  HEENT:  Pink conjunctivae, PERRL, hearing intact to voice, moist mucous membranes  Neck:  Supple, without masses, thyroid non-tender  Resp:  No accessory muscle use, clear breath sounds without wheezes rales or rhonchi  Card:  No murmurs, normal S1, S2 without thrills,   Abd:  Soft, non-tender, non-distended, normoactive bowel sounds are present, no palpable organomegaly and no detectable hernias  Musc:  No cyanosis or clubbing.  Legs pain to palpation  Skin:  No rashes or ulcers, skin turgor is good  Neuro:  Cranial nerves 3-12 are grossly intact,  follows commands appropriately  Psych:  poor insight, oriented to person, place       Medications Reviewed: (see below)    Lab Data Reviewed: (see below)    ______________________________________________________________________    Medications:     Current Facility-Administered Medications   Medication Dose Route Frequency   ??? insulin lispro  (HUMALOG) injection   SubCUTAneous TIDAC   ??? insulin glargine (LANTUS) injection 15 Units  15 Units SubCUTAneous DAILY   ??? 0.9% sodium chloride infusion  75 mL/hr IntraVENous CONTINUOUS   ??? levofloxacin (LEVAQUIN) 750 mg in D5W IVPB  750 mg IntraVENous Q48H   ??? ranolazine ER (RANEXA) tablet 500 mg  500 mg Oral BID   ??? epoetin alfa (EPOGEN;PROCRIT) injection 20,000 Units  20,000 Units SubCUTAneous Q7D   ??? gabapentin (NEURONTIN) capsule 300 mg  300 mg Oral TID   ??? dextrose (D50W) injection Syrg 12.5-25 g  12.5-25 g IntraVENous PRN   ??? famotidine (PEPCID) tablet 20 mg  20 mg Oral DAILY   ??? aspirin chewable tablet 81 mg  81 mg Oral DAILY   ??? atorvastatin (LIPITOR) tablet 40 mg  40 mg Oral QHS   ??? clopidogrel (PLAVIX) tablet 75 mg  75 mg Oral DAILY   ??? ferrous sulfate tablet 325 mg  325 mg Oral ACB&D   ??? fish oil-omega-3 fatty acids 340-1,000 mg capsule 1 Cap  1 Cap Oral BID   ??? isosorbide mononitrate ER (IMDUR) tablet 30 mg  30 mg Oral DAILY   ??? levothyroxine (SYNTHROID) tablet 150 mcg  150 mcg Oral ACB   ??? metoprolol (LOPRESSOR) tablet 100 mg  100 mg Oral BID   ??? misoprostol (CYTOTEC) tablet 200 mcg  200 mcg Oral BID   ??? nitroglycerin (NITROSTAT) tablet 0.4 mg  0.4 mg SubLINGual PRN   ??? oxyCODONE-acetaminophen (PERCOCET 7.5) 7.5-325 mg per tablet 1 Tab  1 Tab Oral Q4H PRN   ??? terazosin (HYTRIN) capsule 10 mg  10 mg Oral QHS   ??? acetaminophen (TYLENOL) tablet 650 mg  650 mg Oral Q4H PRN   ??? ondansetron (ZOFRAN) injection 4 mg  4 mg IntraVENous Q4H PRN   ??? heparin (porcine) injection 5,000 Units  5,000 Units SubCUTAneous Q8H   ??? pantoprazole (PROTONIX) tablet 40 mg  40 mg Oral ACB   ??? levalbuterol (XOPENEX) nebulizer soln 0.63 mg/3 ml  0.63 mg Nebulization Q4H PRN   ??? glucose chewable tablet 16 g  4 Tab Oral PRN   ??? glucagon (GLUCAGEN) injection 1 mg  1 mg IntraMUSCular PRN          Lab Review:     Recent Labs   Basename 08/27/11 0510 08/26/11 0510 08/25/11 0230    WBC 8.3 9.4 10.0    HGB 7.8* 8.2* 7.3*    HCT 23.4*  25.1* 22.5*    PLT 190 191 189     Recent Labs   Basename 08/27/11 0510 08/26/11 0510 08/25/11 0230    NA 133* 134* 137    K 4.6 4.5 4.3    CL 99 100 103    CO2 24 28 27     GLU 92 51* 123*    BUN 46* 42* 36*    CREA 3.44* 2.96* 2.28*    CA 7.7* 7.6* 7.5*    MG 2.1 2.1 2.2    PHOS -- 2.7 2.7    ALB -- -- --    TBIL -- -- --    SGOT -- -- --    INR -- -- --     Lab Results   Component Value Date/Time    POC GLUCOSE 170 08/27/2011 12:18 AM    POC GLUCOSE 118 08/26/2011  8:07 PM    POC GLUCOSE 179 08/26/2011  4:26 PM    POC GLUCOSE 175 08/26/2011 12:06 PM    POC GLUCOSE 101 08/26/2011  7:23 AM          Assessment / Plan:     Principal Problem:    71 yo hx of CAD, syst CHF EF 40%, DM, HTN, PVD, CKD, presented w/ NSTEMI, ARF    1) Chest pain/NSTEMI: Cards following.  Cont BB, nitro, plavix, ranexa.  Holding cardiac MRI and cath due to ARF.  Management per Cards    2) ARF/CKD: unclear level of CKD.  ARF thought to be volume related, but not improving with IVF.  Holding diuretics.  Renal is following.  Monitor BMP    3) Pneumonia: patchy ASDz seen on admission.  This could be related to interstitial edema.  Cx neg.  D/c levaquin    4) DM/hypoglycemia: hypoglycemia likely from ARF.  Cont low dose Lantus and SSI.  Monitor    5) Anemia: likely chronic, related to CKD.  Also had a hx of GI bleed from AVMs.  Now s/p 1u RBCs (given ischemia).  Checking stool blood, iron studies.  Monitor closely    6) PVD: hx of bypass in legs.  No ischemia per Vascular    7) Legs pain: likely combination of diabetic neuropathy and PVD.  LE dopplers neg for DVT    8) AMS: unclear etiology.  Seems to be slowly improving after discontinuing oxycontin.  Head CT and ammonia wnl.  Monitor for now    Total time spent with patient: 18 Minutes                  Care Plan discussed with: Patient    Discussed:  Care Plan    Prophylaxis:  Hep SQ    Disposition:  Home w/Family           ___________________________________________________    Attending Physician: Twanna Hy.  Whittley Carandang, MD

## 2011-08-27 NOTE — Progress Notes (Signed)
NUTRITION    BEST PRACTICE ALERT:   Braden Score: 18      Nutrition: Excellent  Patient Vitals for the past 100 hrs:   % Diet Eaten   08/25/11 1351 70 %   08/25/11 0841 50 %   08/24/11 1826 100 %   08/24/11 1357 50 %   08/24/11 0837 75 %     Weight Metrics 08/21/2011 08/13/2011 07/23/2011 05/25/2011 05/16/2011   Weight 182 lb 3.2 oz 184 lb 189 lb 200 lb 8 oz -   BMI 29.42 kg/m2 29.71 kg/m2 30.52 kg/m2 - 32.38 kg/m2         Pt screened for nutrition risk d/t LOS. He is confused, unable to give a reliable hx. Per RN, he ate well at breakfast this am, but about 25% of lunch today. Minimal records in flow sheet regarding po. Pt reports he is eating well, but affect if very flat w/ minimal detail given regarding appetite. Weight has dropped some over past few months per chart review. On past admissions he usually eats very well w/ 75-100% intake at meals. Will go ahead and add supplement BID for additional kcal/pro intake. Pt noted w/ hx of DM, DTC following for glucose control.     RD PLAN:   Add Glucerna BID,   Monitor po intakes, labs, and plan of care  Low-moderate nutrition risk identified at this time.         Curt Jews. Orlene Erm, RD, CNSC

## 2011-08-28 LAB — GLUCOSE, POC
Glucose (POC): 237 mg/dL — ABNORMAL HIGH (ref 75–110)
Glucose (POC): 279 mg/dL — ABNORMAL HIGH (ref 75–110)
Glucose (POC): 275 mg/dL — ABNORMAL HIGH (ref 75–110)
Glucose (POC): 334 mg/dL — ABNORMAL HIGH (ref 75–110)
Glucose (POC): 295 mg/dL — ABNORMAL HIGH (ref 75–110)

## 2011-08-28 LAB — CBC W/O DIFF
HCT: 24.5 % — ABNORMAL LOW (ref 36.6–50.3)
HGB: 8.2 g/dL — ABNORMAL LOW (ref 12.1–17.0)
MCH: 28.7 PG (ref 26.0–34.0)
MCHC: 33.5 g/dL (ref 30.0–36.5)
MCV: 85.7 FL (ref 80.0–99.0)
PLATELET: 205 10*3/uL (ref 150–400)
RBC: 2.86 M/uL — ABNORMAL LOW (ref 4.10–5.70)
RDW: 17.6 % — ABNORMAL HIGH (ref 11.5–14.5)
WBC: 8.5 10*3/uL (ref 4.1–11.1)

## 2011-08-28 LAB — METABOLIC PANEL, BASIC
Anion gap: 6 mmol/L (ref 5–15)
BUN/Creatinine ratio: 18 (ref 12–20)
BUN: 49 MG/DL — ABNORMAL HIGH (ref 6–20)
CO2: 24 MMOL/L (ref 21–32)
Calcium: 8.1 MG/DL — ABNORMAL LOW (ref 8.5–10.1)
Chloride: 99 MMOL/L (ref 97–108)
Creatinine: 2.72 MG/DL — ABNORMAL HIGH (ref 0.45–1.15)
GFR est AA: 28 mL/min/{1.73_m2} — ABNORMAL LOW (ref >60)
GFR est non-AA: 23 mL/min/{1.73_m2} — ABNORMAL LOW (ref >60)
Glucose: 224 MG/DL — ABNORMAL HIGH (ref 65–100)
Potassium: 4.8 MMOL/L (ref 3.5–5.1)
Sodium: 129 MMOL/L — ABNORMAL LOW (ref 136–145)

## 2011-08-28 LAB — CULTURE, BLOOD, PAIRED: Culture result:: NO GROWTH

## 2011-08-28 LAB — PROTEIN URINE, RANDOM: Protein, urine random: 86 MG/DL — ABNORMAL HIGH (ref 0.0–11.9)

## 2011-08-28 LAB — EOSINOPHILS, URINE: Eosinophils,urine: NEGATIVE

## 2011-08-28 LAB — CREATININE, UR, RANDOM: Creatinine, urine random: 180.01 mg/dL

## 2011-08-28 LAB — MAGNESIUM: Magnesium: 2.2 MG/DL (ref 1.6–2.4)

## 2011-08-28 LAB — PHOSPHORUS: Phosphorus: 2.6 MG/DL (ref 2.5–4.9)

## 2011-08-28 LAB — SODIUM, UR, RANDOM: Sodium,urine random: 18 MMOL/L

## 2011-08-28 MED ORDER — INSULIN LISPRO 100 UNIT/ML INJECTION
100 unit/mL | Freq: Three times a day (TID) | SUBCUTANEOUS | Status: DC
Start: 2011-08-28 — End: 2011-09-03
  Administered 2011-08-28 – 2011-09-02 (×6): via SUBCUTANEOUS

## 2011-08-28 MED ORDER — INSULIN GLARGINE 100 UNIT/ML INJECTION
100 unit/mL | Freq: Every day | SUBCUTANEOUS | Status: DC
Start: 2011-08-28 — End: 2011-08-29
  Administered 2011-08-28 – 2011-08-29 (×2): via SUBCUTANEOUS

## 2011-08-28 MED FILL — TERAZOSIN 5 MG CAP: 5 mg | ORAL | Qty: 2

## 2011-08-28 MED FILL — INSULIN LISPRO 100 UNIT/ML INJECTION: 100 unit/mL | SUBCUTANEOUS | Qty: 1

## 2011-08-28 MED FILL — RANEXA 500 MG TABLET,EXTENDED RELEASE: 500 mg | ORAL | Qty: 1

## 2011-08-28 MED FILL — MISOPROSTOL 200 MCG TAB: 200 mcg | ORAL | Qty: 1

## 2011-08-28 MED FILL — INSULIN GLARGINE 100 UNIT/ML INJECTION: 100 unit/mL | SUBCUTANEOUS | Qty: 0.2

## 2011-08-28 MED FILL — LIPITOR 20 MG TABLET: 20 mg | ORAL | Qty: 2

## 2011-08-28 MED FILL — OXYCODONE-ACETAMINOPHEN 7.5 MG-325 MG TAB: ORAL | Qty: 1

## 2011-08-28 MED FILL — FERROUS SULFATE 325 MG (65 MG ELEMENTAL IRON) TAB: 325 mg (65 mg iron) | ORAL | Qty: 1

## 2011-08-28 MED FILL — INSULIN LISPRO 100 UNIT/ML INJECTION: 100 unit/mL | SUBCUTANEOUS | Qty: 4

## 2011-08-28 MED FILL — PLAVIX 75 MG TABLET: 75 mg | ORAL | Qty: 1

## 2011-08-28 MED FILL — PANTOPRAZOLE 40 MG TAB, DELAYED RELEASE: 40 mg | ORAL | Qty: 1

## 2011-08-28 MED FILL — HEPARIN (PORCINE) 5,000 UNIT/ML IJ SOLN: 5000 unit/mL | INTRAMUSCULAR | Qty: 1

## 2011-08-28 MED FILL — ASPIRIN 81 MG CHEWABLE TAB: 81 mg | ORAL | Qty: 1

## 2011-08-28 MED FILL — INSULIN GLARGINE 100 UNIT/ML INJECTION: 100 unit/mL | SUBCUTANEOUS | Qty: 0.15

## 2011-08-28 MED FILL — FISH OIL 340 MG-1,000 MG CAPSULE: 340-1000 mg | ORAL | Qty: 1

## 2011-08-28 MED FILL — LEVOTHYROXINE 150 MCG TAB: 150 mcg | ORAL | Qty: 1

## 2011-08-28 MED FILL — ISOSORBIDE MONONITRATE SR 30 MG 24 HR TAB: 30 mg | ORAL | Qty: 1

## 2011-08-28 MED FILL — FAMOTIDINE 20 MG TAB: 20 mg | ORAL | Qty: 1

## 2011-08-28 MED FILL — METOPROLOL TARTRATE 50 MG TAB: 50 mg | ORAL | Qty: 2

## 2011-08-28 NOTE — Progress Notes (Signed)
[facility]  [address] [place], [place] [address]  [phone]      Medical Progress Note      NAME: Travis [name]   DOB:  12/13/1940  MRM:  [id number]    Date/Time: 08/28/2011         Subjective:     Chief Complaint:  Patient was seen and examined by me.  Chart reviewed.  Confusion much better.  No other complaints       Objective:       Vitals:       Last 24hrs VS reviewed since prior progress note. Most recent are:    Visit Vitals   Item Reading   ??? BP 148/61   ??? Pulse 80   ??? Temp 98.5 ??F (36.9 ??C)   ??? Resp 20   ??? Ht 5\' 6"  (1.676 m)   ??? Wt 182 lb 3.2 oz   ??? BMI 29.41 kg/m2   ??? SpO2 98%     SpO2 Readings from Last 6 Encounters:   08/28/11 98%   08/13/11 99%   08/13/11 99%   07/24/11 98%   05/25/11 97%   11/13/09 98%    O2 Flow Rate (L/min): 2 l/min       Intake/Output Summary (Last 24 hours) at 08/28/11 0853  Last data filed at 08/28/11 0536   Gross per 24 hour   Intake      0 ml   Output   1325 ml   Net  -1325 ml        Exam:     Physical Exam:    Gen:  Well-developed, well-nourished, in no acute distress  HEENT:  Pink conjunctivae, PERRL, hearing intact to voice, moist mucous membranes  Neck:  Supple, without masses, thyroid non-tender  Resp:  No accessory muscle use, clear breath sounds without wheezes rales or rhonchi  Card:  No murmurs, normal S1, S2 without thrills,   Abd:  Soft, non-tender, non-distended, normoactive bowel sounds are present, no palpable organomegaly and no detectable hernias  Musc:  No cyanosis or clubbing.  Legs pain to palpation  Skin:  No rashes or ulcers, skin turgor is good  Neuro:  Cranial nerves 3-12 are grossly intact,  follows commands appropriately  Psych:  mod insight, oriented to person, place       Medications Reviewed: (see below)    Lab Data Reviewed: (see below)    ______________________________________________________________________    Medications:     Current Facility-Administered Medications   Medication Dose Route Frequency   ???  insulin glargine (LANTUS) injection 20 Units  20 Units SubCUTAneous DAILY   ??? insulin lispro (HUMALOG) injection   SubCUTAneous TIDAC   ??? 0.9% sodium chloride infusion  75 mL/hr IntraVENous CONTINUOUS   ??? ranolazine ER (RANEXA) tablet 500 mg  500 mg Oral BID   ??? epoetin alfa (EPOGEN;PROCRIT) injection 20,000 Units  20,000 Units SubCUTAneous Q7D   ??? dextrose (D50W) injection Syrg 12.5-25 g  12.5-25 g IntraVENous PRN   ??? famotidine (PEPCID) tablet 20 mg  20 mg Oral DAILY   ??? aspirin chewable tablet 81 mg  81 mg Oral DAILY   ??? atorvastatin (LIPITOR) tablet 40 mg  40 mg Oral QHS   ??? clopidogrel (PLAVIX) tablet 75 mg  75 mg Oral DAILY   ??? ferrous sulfate tablet 325 mg  325 mg Oral ACB&D   ??? fish oil-omega-3 fatty acids 340-1,000 mg capsule 1 Cap  1 Cap Oral  BID   ??? isosorbide mononitrate ER (IMDUR) tablet 30 mg  30 mg Oral DAILY   ??? levothyroxine (SYNTHROID) tablet 150 mcg  150 mcg Oral ACB   ??? metoprolol (LOPRESSOR) tablet 100 mg  100 mg Oral BID   ??? misoprostol (CYTOTEC) tablet 200 mcg  200 mcg Oral BID   ??? nitroglycerin (NITROSTAT) tablet 0.4 mg  0.4 mg SubLINGual PRN   ??? oxyCODONE-acetaminophen (PERCOCET 7.5) 7.5-325 mg per tablet 1 Tab  1 Tab Oral Q4H PRN   ??? terazosin (HYTRIN) capsule 10 mg  10 mg Oral QHS   ??? acetaminophen (TYLENOL) tablet 650 mg  650 mg Oral Q4H PRN   ??? ondansetron (ZOFRAN) injection 4 mg  4 mg IntraVENous Q4H PRN   ??? heparin (porcine) injection 5,000 Units  5,000 Units SubCUTAneous Q8H   ??? pantoprazole (PROTONIX) tablet 40 mg  40 mg Oral ACB   ??? levalbuterol (XOPENEX) nebulizer soln 0.63 mg/3 ml  0.63 mg Nebulization Q4H PRN   ??? glucose chewable tablet 16 g  4 Tab Oral PRN   ??? glucagon (GLUCAGEN) injection 1 mg  1 mg IntraMUSCular PRN          Lab Review:     Recent Labs   Basename 08/28/11 0450 08/27/11 0510 08/26/11 0510    WBC 8.5 8.3 9.4    HGB 8.2* 7.8* 8.2*    HCT 24.5* 23.4* 25.1*    PLT 205 190 191     Recent Labs   Basename 08/28/11 0450 08/27/11 0510 08/26/11 0510    NA 129* 133* 134*     K 4.8 4.6 4.5    CL 99 99 100    CO2 24 24 28     GLU 224* 92 51*    BUN 49* 46* 42*    CREA 2.72* 3.44* 2.96*    CA 8.1* 7.7* 7.6*    MG 2.2 2.1 2.1    PHOS 2.6 -- 2.7    ALB -- -- --    TBIL -- -- --    SGOT -- -- --    INR -- -- --     Lab Results   Component Value Date/Time    POC GLUCOSE 275 08/28/2011  7:47 AM    POC GLUCOSE 279 08/27/2011  9:58 PM    POC GLUCOSE 237 08/27/2011  8:28 PM    POC GLUCOSE 231 08/27/2011  4:22 PM    POC GLUCOSE 272 08/27/2011 11:57 AM          Assessment / Plan:     Principal Problem:    71 yo hx of CAD, syst CHF EF 40%, DM, HTN, PVD, CKD, presented w/ NSTEMI, ARF    1) Chest pain/NSTEMI: Now chest pain free.  Cards following.  Cont BB, nitro, plavix, ranexa.  Holding cardiac MRI and cath due to ARF.  Management per Cards    2) ARF/CKD: Not improving.  unclear level of CKD.  ARF initially thought to be volume related, but not improving with IVF.  Holding diuretics.  Renal is following.  Might need a renal bx    3) Pneumonia: patchy ASDz seen on admission.  This could be related to interstitial edema.  Cx neg.  S/p 5 days of levaquin    4) DM 2: BS elevated today. Will increase Lantus.  Has had hx of hypoglycemia due to poor PO intake.  Cont SSI    5) Anemia: likely chronic, related to CKD.  Also had a hx of  GI bleed from AVMs.  Now s/p 1u RBCs (given ischemia).  Monitor closely    6) PVD: hx of bypass in legs.  No ischemia per Vascular    7) Legs pain: likely combination of diabetic neuropathy and PVD.  LE dopplers neg for DVT    8) AMS: Improving after holding oxycontin and neurontin.  Head CT and ammonia wnl.  Monitor for now    Total time spent with patient: 25 Minutes                  Care Plan discussed with: Patient    Discussed:  Care Plan    Prophylaxis:  Hep SQ    Disposition:  Home w/Family           ___________________________________________________    Attending Physician: Twanna Hy. Clancy Mullarkey, MD

## 2011-08-28 NOTE — Progress Notes (Signed)
Palliative Medicine Social Work Note    08/28/2011     SW made visit to pt; no family present.  Pt was awake in bed with lights turned off in room, was difficult to engage/not particularly receptive to conversation.  Pt expressed belief that he will be in the hospital for a while but expressed wish to return home soon, stated he would be ready to go home tomorrow if he could.  Discussed transitioning to SNF upon d/c; pt was noncommittal but did indicate understanding of the benefits of having PT if his goal is to return home.  SW will continue to follow for support while pt is inpt.  Thank you for including Palliative Medicine in the care of Travis [name].    [address] [place], [place]  [phone]

## 2011-08-28 NOTE — Progress Notes (Signed)
Bedside and Verbal shift change report given to Heather RN (oncoming nurse) by Shakisa RN (offgoing nurse).  Report given with SBAR, Kardex, Intake/Output, MAR and Recent Results.

## 2011-08-28 NOTE — Progress Notes (Addendum)
Cardiology Progress Note         NAME:  Travis [name]   DOB:   11/23/1940   MRN:   [id number]     Assessment/Plan:   1. NonSTEMI : EF 40%.Remains pain free.  Cont medical mgt for CAD although PCI CTO remains a consideration. Continue ranexa, plavix, asa, BB, Imdur, statin.  No plans for MRI until renal function improves significantly. Acute renal failure with continually rising Cr is a contraindication for Gd administration.   2. Dyslipidemia: cont statin   3. HTN   4. diabetes :per attending  5 Renal insufficiency: creat 2.72 this am. Unclear level of CKD. Holding diuretics. Renal is following. Monitor BMP.   6. PVD/leg pain : Dopplers negative.    7. Depressed  8.  Deconditioned:  PT   9.  Confusion: appears resolved  10.  abnml sensing of PPM:  None further, no issues w pacer interrogation      Will follow peripherally and if renal function improves will pursue cardiac MRI.         Cardiology attending:  Pt seen and examined.    Not much to add at this time. Will address viability issue as outpatient. Will see again in hospital prn    [name], MD      Subjective:   Travis [name] is a 71 y.o. AA male  With a PMH of  diabetes poorly controlled, hypertension, hyperlipidemia, coronary artery disease and OSA and peripheral artery disease. Symptoms include: chest pressure/discomfort Cardiac risk factors: family history, dyslipidemia, diabetes mellitus, obesity, sedentary life style, male gender, hypertension. He presented to the ER after having used NTG for chest pain. Marland Kitchen He has severe CAD based on recent cardiac catheterization. He was recently discharged and was scheduled to see Dr. Welton Flakes as outpatient. He was last admitted with non-ST elevated myocardial infarction . Cath was done, but CAD was not amenable to PCI. Medical management was recommended at that time.   Of note his son recently has been diagnosed with AML and is under treatment. The patient is under stress regarding  this and may have less interest in taking care of himself.        Cardiac ROS:  Patient denies any exertional chest pain, dyspnea, palpitations, syncope, orthopnea, edema or paroxysmal nocturnal dyspnea.  .          Review of Systems:  No nausea, indigestion, vomiting, cough, sputum. No bleeding. Appetite fair. OOB with PT.               Objective:     BP 148/61   Pulse 80   Temp 98.5 ??F (36.9 ??C)   Resp 20   Ht 5\' 6"  (1.676 m)   Wt 82.645 kg (182 lb 3.2 oz)   BMI 29.41 kg/m2   SpO2 98% O2 Flow Rate (L/min): 2 l/min O2 Device: Room air    Temp (24hrs), Avg:99.3 ??F (37.4 ??C), Min:98.5 ??F (36.9 ??C), Max:100.3 ??F (37.9 ??C)           04/01 1900 - 04/03 0659  In: 1586.3 [I.V.:1586.3]  Out: 1525 [Urine:1525]  TELE: SR    V paced     General: AAOx3 cooperative, no acute distress.  HEENT: Atraumatic. Pink and moist.  Anicteric sclerae.  Neck : Supple, no thyromegaly.   Lungs: CTA bilaterally. No wheezing/rhonchi/rales.  Heart: Regular rhythm, no murmur, no rubs, no gallops. No JVD. No carotid bruits.   Abdomen: Soft, non-distended, non-tender. + Bowel  sounds. No bruits.  Extremities: Trace edema. No clubbing, no cyanosis. No calf tenderness  Neurologic: Grossly intact.  Alert and oriented X 3.  No acute neurological distress.   Psych: Limited insight. Not anxious, appears depressed.         Care Plan discussed with:    Comments   Patient y    Family      RN y    Comptroller:          Data Review:     No results found for this basename: ITNL in the last 72 hours   No results found for this basename: CPK:3,CKMB:3,TROIQ:3, in the last 72 hours  Recent Labs   Basename 08/28/11 0450 08/27/11 0510 08/26/11 0510    NA 129* 133* 134*    K 4.8 4.6 4.5    CL 99 99 100    CO2 24 24 28     BUN 49* 46* 42*    CREA 2.72* 3.44* 2.96*    GLU 224* 92 51*    PHOS 2.6 -- 2.7    MG 2.2 2.1 2.1    ALB -- -- --    WBC 8.5 8.3 9.4    HGB 8.2* 7.8* 8.2*    HCT 24.5* 23.4* 25.1*    PLT 205 190 191     No results found  for this basename: INR:3,PTP:3,APTT:3, in the last 72 hours    Medications reviewed  Current Facility-Administered Medications   Medication Dose Route Frequency   ??? insulin glargine (LANTUS) injection 20 Units  20 Units SubCUTAneous DAILY   ??? insulin lispro (HUMALOG) injection   SubCUTAneous TIDAC   ??? 0.9% sodium chloride infusion  75 mL/hr IntraVENous CONTINUOUS   ??? ranolazine ER (RANEXA) tablet 500 mg  500 mg Oral BID   ??? epoetin alfa (EPOGEN;PROCRIT) injection 20,000 Units  20,000 Units SubCUTAneous Q7D   ??? dextrose (D50W) injection Syrg 12.5-25 g  12.5-25 g IntraVENous PRN   ??? famotidine (PEPCID) tablet 20 mg  20 mg Oral DAILY   ??? aspirin chewable tablet 81 mg  81 mg Oral DAILY   ??? atorvastatin (LIPITOR) tablet 40 mg  40 mg Oral QHS   ??? clopidogrel (PLAVIX) tablet 75 mg  75 mg Oral DAILY   ??? ferrous sulfate tablet 325 mg  325 mg Oral ACB&D   ??? fish oil-omega-3 fatty acids 340-1,000 mg capsule 1 Cap  1 Cap Oral BID   ??? isosorbide mononitrate ER (IMDUR) tablet 30 mg  30 mg Oral DAILY   ??? levothyroxine (SYNTHROID) tablet 150 mcg  150 mcg Oral ACB   ??? metoprolol (LOPRESSOR) tablet 100 mg  100 mg Oral BID   ??? misoprostol (CYTOTEC) tablet 200 mcg  200 mcg Oral BID   ??? nitroglycerin (NITROSTAT) tablet 0.4 mg  0.4 mg SubLINGual PRN   ??? oxyCODONE-acetaminophen (PERCOCET 7.5) 7.5-325 mg per tablet 1 Tab  1 Tab Oral Q4H PRN   ??? terazosin (HYTRIN) capsule 10 mg  10 mg Oral QHS   ??? acetaminophen (TYLENOL) tablet 650 mg  650 mg Oral Q4H PRN   ??? ondansetron (ZOFRAN) injection 4 mg  4 mg IntraVENous Q4H PRN   ??? heparin (porcine) injection 5,000 Units  5,000 Units SubCUTAneous Q8H   ??? pantoprazole (PROTONIX) tablet 40 mg  40 mg Oral ACB   ??? levalbuterol (XOPENEX) nebulizer soln 0.63 mg/3 ml  0.63 mg Nebulization Q4H PRN   ??? glucose chewable tablet 16 g  4 Tab Oral PRN   ??? glucagon (GLUCAGEN) injection 1 mg  1 mg IntraMUSCular PRN         Stephanie A. Vilma Prader, NP

## 2011-08-28 NOTE — Progress Notes (Signed)
Bedside and Verbal shift change report given to Shakisa RN (oncoming nurse) by Samantha RN (offgoing nurse).  Report given with SBAR, Kardex, MAR and Recent Results.

## 2011-08-28 NOTE — Progress Notes (Signed)
Problem: Mobility Impaired (Adult and Pediatric)  Goal: *Acute Goals and Plan of Care (Insert Text)  Physical Therapy Goals  Initiated 08/24/2011  1. Patient will move from supine to sit and sit to supine in bed with independence within 7 day(s).   2. Patient will transfer from bed to chair and chair to bed with supervision/set-up using the least restrictive device within 7 day(s).  3. Patient will perform sit to stand with modified independence within 7 day(s).  4. Patient will ambulate with min assist for 75 feet with the least restrictive device within 7 day(s).   5. Patient will ascend/descend stairs with handrail(s) with minimal assistance/contact guard assist within 7 day(s), if he has stairs at his discharge location.   PHYSICAL THERAPY TREATMENT  Patient: Travis [name] (71 y.o. male)  Date: 08/28/2011  Diagnosis: Chest pain, unspecified  Chest pain, unspecified Chest pain, unspecified       Precautions: Fall, pressure ulcer      ASSESSMENT:   Pt presents without complaint and transferred to sit edge of bed with moderate assist x1 and verbal cues. Pt reported pain 8/10 in his L foot on application of offloading boot, and during activity. Pt was agitated and nasty to therapist, but ambulated about 20 feet with rolling walker and minimal assist with antalgic gait with decreased pace and step length without loss of balance.  Progression toward goals:  [X]     Improving appropriately and progressing toward goals  [ ]     Improving slowly and progressing toward goals  [ ]     Not making progress toward goals and plan of care will be adjusted       PLAN:   Patient continues to benefit from skilled intervention to address the above impairments.  Continue treatment per established plan of care.  Discharge Recommendations:  Home Health and Skilled Nursing Facility  Further Equipment Recommendations for Discharge:  none       SUBJECTIVE:   Patient stated ???I can't do much but I would like to get to the bathroom.???       OBJECTIVE DATA SUMMARY:   Critical Behavior:  Neurologic State: Alert  Orientation Level: Oriented to person;Oriented to place;Oriented to time  Cognition: Follows commands;Appropriate for age attention/concentration;Appropriate decision making  Safety/Judgement: Awareness of environment;Decreased awareness of need for assistance;Decreased awareness of need for safety;Decreased insight into deficits  Functional Mobility Training:  Bed Mobility:     Supine to Sit: Additional time;Assist X1;Moderate assistance;Verbal cues              Transfers:  Sit to Stand: Additional time;Assist X2;Moderate assistance;Verbal cues  Stand to Sit: Assist X2;Additional time;Minimum assistance;Verbal cues                             Balance:  Sitting: Intact  Standing: Impaired  Standing - Static: Fair  Standing - Dynamic : Fair  Ambulation/Gait Training:  Distance (ft): 20 Feet (ft)  Assistive Device: Gait belt;Walker, rolling  Ambulation - Level of Assistance: Minimal assistance        Gait Abnormalities: Antalgic;Decreased step clearance;Step to gait           Stance: Left decreased     Step Length: Left shortened;Right shortened                             Pt irritable when walking - "I was fine until you  got here"  Stairs:                       Neuro Re-Education:    Therapeutic Exercises:   Exercises in sitting included 10 reps of heel/toe raises, marching. Pt encouraged to exercise hourly on own.    Pain:  Pain Scale 1: Numeric (0 - 10)  Pain Intensity 1: 8  Pain Location 1: Foot  Pain Orientation 1: Left  Pain Description 1:  (on application of offloading boot, during activity)     Activity Tolerance:   poor  Please refer to the flowsheet for vital signs taken during this treatment.  After treatment:   [X]     Patient left in no apparent distress sitting up in chair  [ ]     Patient left in no apparent distress in bed  [X]     Call bell left within reach  [X]     Nursing notified  [X]     Caregiver present  [X]     Chair alarm  activated      COMMUNICATION/COLLABORATION:   The patient???s plan of care was discussed with: Occupational Therapist and Registered Nurse    Kaleen Odea, PT   Time Calculation: 13 mins

## 2011-08-28 NOTE — Progress Notes (Signed)
Problem: Self Care Deficits Care Plan (Adult)  Goal: *Acute Goals and Plan of Care (Insert Text)  Occupational Therapy Goals  Initiated 08/25/2011  1. Patient will perform bathing with supervision/set-up within 7 day(s).  2. Patient will perform lower body dressing with supervision/set-up within 7 day(s).  3. Patient will perform toilet transfers with minimal assistance/contact guard assist within 7 day(s).  4. Patient will perform all aspects of toileting with supervision/set-up within 7 day(s).  5. Patient will participate in upper extremity therapeutic exercise/activities with modified independence for 10 minutes within 7 day(s).       OCCUPATIONAL THERAPY TREATMENT  Patient: Travis [name] (71 y.o. male)  Date: 08/28/2011  Diagnosis: Chest pain, unspecified  Chest pain, unspecified Chest pain, unspecified       Precautions: Fall      ASSESSMENT:   Pt with improved functional mobility and participation today.  He continues to be limited by decreased strength, endurance, mobility, balance, safety and cognition.  He is at an overall mod A level for LE ADLs, toileting and functional mobility.  Recommend SNF for discharge to increase his safety and independence with all functional tasks.  Progression toward goals:  [ ]        Improving appropriately and progressing toward goals  [X]        Improving slowly and progressing toward goals  [ ]        Not making progress toward goals and plan of care will be adjusted       PLAN:   Patient continues to benefit from skilled intervention to address the above impairments.  Continue treatment per established plan of care.  Discharge Recommendations:  Skilled Nursing Facility and To Be Determined  Further Equipment Recommendations for Discharge:  TBD       SUBJECTIVE:   Patient stated ???I feel so much better after moving my bowels.???      OBJECTIVE DATA SUMMARY:   Cognitive/Behavioral Status:  Neurologic State: Alert  Orientation Level: Oriented to person;Oriented to place;Oriented  to time  Cognition: Appropriate decision making;Appropriate for age attention/concentration  Perception: Appears intact  Perseveration: No perseveration noted  Safety/Judgement: Awareness of environment;Decreased awareness of need for assistance;Decreased awareness of need for safety;Decreased insight into deficits  Functional Mobility and Transfers for ADLs:  Bed Mobility:     Supine to Sit: Additional time;Assist X1;Moderate assistance;Verbal cues        Transfers:  Sit to Stand: Additional time;Assist X2;Moderate assistance;Verbal cues                 Toilet Transfer : Minimum assistance (using RW, grab bar and standard toilet)                             Bathroom Mobility: Moderate assistance (for balance and safety)                 Balance:  Sitting: Intact  Standing: Impaired  Standing - Static: Fair  Standing - Dynamic : Fair  ADL Intervention:  Lower Body Dressing Assistance  Shoes with Velcro: Total assistance (dependent) (due to L foot pain)  Shoes with Cloth Laces: Moderate assistance (to don over heel and to tie)  Position Performed: Seated edge of bed  Cues: Physical assistance;Verbal cues provided;Visual cues provided    Toileting  Toileting Assistance: Moderate assistance (for standing balance and bowel hygiene)  Bowel Hygiene: Moderate assistance (for cleanliness - pt unable to effectively reach)  Clothing Management: Minimum  assistance (for standing balance)  Cues: Verbal cues provided;Visual cues provided  Adaptive Equipment: Grab bars;Walker    Cognitive Retraining  Safety/Judgement: Awareness of environment;Decreased awareness of need for assistance;Decreased awareness of need for safety;Decreased insight into deficits    Pain:  Pain Scale 1: Numeric (0 - 10)  Pain Intensity 1: 8  Pain Location 1: Foot  Pain Orientation 1: Left  Pain Description 1:  (on application of offloading boot, during activity)  Pain Intervention(s) 1: Medication (see MAR)  Activity Tolerance:   Fair  Please refer to the  flowsheet for vital signs taken during this treatment.  After treatment:   [X]  Patient left in no apparent distress sitting up in chair  [ ]  Patient left in no apparent distress in bed  [X]  Call bell left within reach  [X]  Nursing notified  [X]  Caregiver present - pt's son and daughter  [X]  Bed alarm activated      COMMUNICATION/COLLABORATION:   The patient???s plan of care was discussed with: Physical Therapist and Registered Nurse    Lucretia Field, OT  Time Calculation: 25 mins

## 2011-08-28 NOTE — Progress Notes (Signed)
Spoke with pt's daughter Travis [name] and pt was supposed to have sutures removed from right foot post amputation of first and second toe today. Surgery took place at [facility] and pt's daughter stated the doctor's name was Dr. Mitzi Hansen. Unable to find doctor's name and number in phone book, and called [facility] and they did not have doctor listed. Spoke with Dr. Drucilla Schmidt and received order to consult surgeon that did surgery to have sutures removed.

## 2011-08-28 NOTE — Discharge Summary (Signed)
This is an interim summary from 03/31 to 04/03    Admission date: 03/27  Admission diagnosis: NSTEMI, ARF    71 yo hx of CAD, syst CHF EF 40%, DM, HTN, PVD, CKD, presented w/ NSTEMI, ARF    1) Chest pain/NSTEMI: Now chest pain free.  Cards following.  Cont BB, nitro, plavix, ranexa.  Holding cardiac MRI and cath due to ARF.  Management per Cards    2) ARF/CKD: Not improving.  unclear level of CKD.  ARF initially thought to be volume related, but not improving with IVF.  Holding diuretics.  Renal is following.  Might need a renal bx    3) Pneumonia: patchy ASDz seen on admission.  This could be related to interstitial edema.  Cx neg.  S/p 5 days of levaquin    4) DM 2: BS elevated today. Will increase Lantus.  Has had hx of hypoglycemia due to poor PO intake.  Cont SSI    5) Anemia: likely chronic, related to CKD.  Also had a hx of GI bleed from AVMs.  Now s/p 1u RBCs (given ischemia).  Monitor closely    6) PVD: hx of bypass in legs.  No ischemia per Vascular    7) Legs pain: likely combination of diabetic neuropathy and PVD.  LE dopplers neg for DVT    8) AMS: Improving after holding oxycontin and neurontin.  Head CT and ammonia wnl.  Monitor for now    Dispo: home once renal failure improves and cardiac evaluation has been done    Current meds:  Current Facility-Administered Medications   Medication Dose Route Frequency   ??? insulin glargine (LANTUS) injection 20 Units  20 Units SubCUTAneous DAILY   ??? insulin lispro (HUMALOG) injection   SubCUTAneous TIDAC   ??? 0.9% sodium chloride infusion  75 mL/hr IntraVENous CONTINUOUS   ??? ranolazine ER (RANEXA) tablet 500 mg  500 mg Oral BID   ??? epoetin alfa (EPOGEN;PROCRIT) injection 20,000 Units  20,000 Units SubCUTAneous Q7D   ??? dextrose (D50W) injection Syrg 12.5-25 g  12.5-25 g IntraVENous PRN   ??? famotidine (PEPCID) tablet 20 mg  20 mg Oral DAILY   ??? aspirin chewable tablet 81 mg  81 mg Oral DAILY   ??? atorvastatin (LIPITOR) tablet 40 mg  40 mg Oral QHS   ??? clopidogrel  (PLAVIX) tablet 75 mg  75 mg Oral DAILY   ??? ferrous sulfate tablet 325 mg  325 mg Oral ACB&D   ??? fish oil-omega-3 fatty acids 340-1,000 mg capsule 1 Cap  1 Cap Oral BID   ??? isosorbide mononitrate ER (IMDUR) tablet 30 mg  30 mg Oral DAILY   ??? levothyroxine (SYNTHROID) tablet 150 mcg  150 mcg Oral ACB   ??? metoprolol (LOPRESSOR) tablet 100 mg  100 mg Oral BID   ??? misoprostol (CYTOTEC) tablet 200 mcg  200 mcg Oral BID   ??? nitroglycerin (NITROSTAT) tablet 0.4 mg  0.4 mg SubLINGual PRN   ??? oxyCODONE-acetaminophen (PERCOCET 7.5) 7.5-325 mg per tablet 1 Tab  1 Tab Oral Q4H PRN   ??? terazosin (HYTRIN) capsule 10 mg  10 mg Oral QHS   ??? acetaminophen (TYLENOL) tablet 650 mg  650 mg Oral Q4H PRN   ??? ondansetron (ZOFRAN) injection 4 mg  4 mg IntraVENous Q4H PRN   ??? heparin (porcine) injection 5,000 Units  5,000 Units SubCUTAneous Q8H   ??? pantoprazole (PROTONIX) tablet 40 mg  40 mg Oral ACB   ??? levalbuterol (XOPENEX) nebulizer soln 0.63  mg/3 ml  0.63 mg Nebulization Q4H PRN   ??? glucose chewable tablet 16 g  4 Tab Oral PRN   ??? glucagon (GLUCAGEN) injection 1 mg  1 mg IntraMUSCular PRN

## 2011-08-28 NOTE — Progress Notes (Signed)
[facility] Progress Note  [facility]   [address] [place], [place] [address]   [phone]      Assessment & Plan:   ARF/CKD  ?? Better today  ?? ?due to foley placement  ?? FeNa was low again, I suspect cardiorenal syndrome with widely variable renal function  ?? Continue foley one more day and follow labs    AMS  ?? Improved  ?? Continue to hold gabapentin    HTN  ?? Stable    CP/CAD  ?? Medical mgmt for now    Anemia  ?? EPO + PO Fe       Subjective:   CC: f/u ARF, CKD  HPI: ARF is better today. Pt is more alert and less confused.   ROS: No SOB/CP/N/V  Current Facility-Administered Medications   Medication Dose Route Frequency   ??? insulin glargine (LANTUS) injection 20 Units  20 Units SubCUTAneous DAILY   ??? insulin lispro (HUMALOG) injection   SubCUTAneous TIDAC   ??? ranolazine ER (RANEXA) tablet 500 mg  500 mg Oral BID   ??? epoetin alfa (EPOGEN;PROCRIT) injection 20,000 Units  20,000 Units SubCUTAneous Q7D   ??? dextrose (D50W) injection Syrg 12.5-25 g  12.5-25 g IntraVENous PRN   ??? famotidine (PEPCID) tablet 20 mg  20 mg Oral DAILY   ??? aspirin chewable tablet 81 mg  81 mg Oral DAILY   ??? atorvastatin (LIPITOR) tablet 40 mg  40 mg Oral QHS   ??? clopidogrel (PLAVIX) tablet 75 mg  75 mg Oral DAILY   ??? ferrous sulfate tablet 325 mg  325 mg Oral ACB&D   ??? fish oil-omega-3 fatty acids 340-1,000 mg capsule 1 Cap  1 Cap Oral BID   ??? isosorbide mononitrate ER (IMDUR) tablet 30 mg  30 mg Oral DAILY   ??? levothyroxine (SYNTHROID) tablet 150 mcg  150 mcg Oral ACB   ??? metoprolol (LOPRESSOR) tablet 100 mg  100 mg Oral BID   ??? misoprostol (CYTOTEC) tablet 200 mcg  200 mcg Oral BID   ??? nitroglycerin (NITROSTAT) tablet 0.4 mg  0.4 mg SubLINGual PRN   ??? oxyCODONE-acetaminophen (PERCOCET 7.5) 7.5-325 mg per tablet 1 Tab  1 Tab Oral Q4H PRN   ??? terazosin (HYTRIN) capsule 10 mg  10 mg Oral QHS   ??? acetaminophen (TYLENOL) tablet 650 mg  650 mg Oral Q4H PRN   ??? ondansetron  (ZOFRAN) injection 4 mg  4 mg IntraVENous Q4H PRN   ??? heparin (porcine) injection 5,000 Units  5,000 Units SubCUTAneous Q8H   ??? pantoprazole (PROTONIX) tablet 40 mg  40 mg Oral ACB   ??? levalbuterol (XOPENEX) nebulizer soln 0.63 mg/3 ml  0.63 mg Nebulization Q4H PRN   ??? glucose chewable tablet 16 g  4 Tab Oral PRN   ??? glucagon (GLUCAGEN) injection 1 mg  1 mg IntraMUSCular PRN          Objective:     Vitals:  Blood pressure 148/61, pulse 80, temperature 98.5 ??F (36.9 ??C), resp. rate 20, height 5\' 6"  (1.676 m), weight 82.645 kg (182 lb 3.2 oz), SpO2 98.00%.  Temp (24hrs), Avg:99.3 ??F (37.4 ??C), Min:98.5 ??F (36.9 ??C), Max:100.3 ??F (37.9 ??C)      Intake and Output:     04/01 1900 - 04/03 0659  In: 1586.3 [I.V.:1586.3]  Out: 1525 [Urine:1525]    Physical Exam:               GENERAL ASSESSMENT: NAD,  alert  CHEST: CTA  HEART: S1S2  ABDOMEN: Soft,NT, +BS  EXTREMITY: trace EDEMA            Data Review      No results found for this basename: ITNL in the last 72 hours   No results found for this basename: CPK:3,CKMB:3,TROIQ:3, in the last 72 hours  Recent Labs   [facility] 08/28/11 0450 08/27/11 0510 08/26/11 0510    NA 129* 133* 134*    K 4.8 4.6 4.5    CL 99 99 100    CO2 24 24 28     BUN 49* 46* 42*    CREA 2.72* 3.44* 2.96*    GLU 224* 92 51*    PHOS 2.6 -- 2.7    MG 2.2 2.1 2.1    CA 8.1* 7.7* 7.6*    ALB -- -- --    WBC 8.5 8.3 9.4    HGB 8.2* 7.8* 8.2*    HCT 24.5* 23.4* 25.1*    PLT 205 190 191      No results found for this basename: INR:3,PTP:3,APTT:3, in the last 72 hours  Needs: urine analysis, urine sodium, protein and creatinine  Lab Results   Component Value Date/Time    Sodium,urine random 18 08/27/2011  6:45 PM    Creatinine,urine random 180.01 08/27/2011  6:45 PM                 [facility]  [address]  [place], [place] [address]  Phone - [phone]  Fax - [phone]

## 2011-08-29 LAB — CBC W/O DIFF
HCT: 26.1 % — ABNORMAL LOW (ref 36.6–50.3)
HGB: 8.5 g/dL — ABNORMAL LOW (ref 12.1–17.0)
MCH: 28 PG (ref 26.0–34.0)
MCHC: 32.6 g/dL (ref 30.0–36.5)
MCV: 85.9 FL (ref 80.0–99.0)
PLATELET: 235 10*3/uL (ref 150–400)
RBC: 3.04 M/uL — ABNORMAL LOW (ref 4.10–5.70)
RDW: 17.6 % — ABNORMAL HIGH (ref 11.5–14.5)
WBC: 8.5 10*3/uL (ref 4.1–11.1)

## 2011-08-29 LAB — METABOLIC PANEL, BASIC
Anion gap: 7 mmol/L (ref 5–15)
BUN/Creatinine ratio: 21 — ABNORMAL HIGH (ref 12–20)
BUN: 43 MG/DL — ABNORMAL HIGH (ref 6–20)
CO2: 26 MMOL/L (ref 21–32)
Calcium: 8.4 MG/DL — ABNORMAL LOW (ref 8.5–10.1)
Chloride: 102 MMOL/L (ref 97–108)
Creatinine: 2.02 MG/DL — ABNORMAL HIGH (ref 0.45–1.15)
GFR est AA: 40 mL/min/{1.73_m2} — ABNORMAL LOW (ref >60)
GFR est non-AA: 33 mL/min/{1.73_m2} — ABNORMAL LOW (ref >60)
Glucose: 160 MG/DL — ABNORMAL HIGH (ref 65–100)
Potassium: 5.1 MMOL/L (ref 3.5–5.1)
Sodium: 135 MMOL/L — ABNORMAL LOW (ref 136–145)

## 2011-08-29 LAB — MAGNESIUM: Magnesium: 2 MG/DL (ref 1.6–2.4)

## 2011-08-29 LAB — GLUCOSE, POC
Glucose (POC): 204 mg/dL — ABNORMAL HIGH (ref 75–110)
Glucose (POC): 192 mg/dL — ABNORMAL HIGH (ref 75–110)
Glucose (POC): 185 mg/dL — ABNORMAL HIGH (ref 75–110)
Glucose (POC): 223 mg/dL — ABNORMAL HIGH (ref 75–110)

## 2011-08-29 MED ORDER — INSULIN GLARGINE 100 UNIT/ML INJECTION
100 unit/mL | Freq: Every day | SUBCUTANEOUS | Status: DC
Start: 2011-08-29 — End: 2011-09-01
  Administered 2011-08-30 – 2011-09-01 (×3): via SUBCUTANEOUS

## 2011-08-29 MED FILL — FERROUS SULFATE 325 MG (65 MG ELEMENTAL IRON) TAB: 325 mg (65 mg iron) | ORAL | Qty: 1

## 2011-08-29 MED FILL — RANEXA 500 MG TABLET,EXTENDED RELEASE: 500 mg | ORAL | Qty: 1

## 2011-08-29 MED FILL — LIPITOR 20 MG TABLET: 20 mg | ORAL | Qty: 2

## 2011-08-29 MED FILL — INSULIN LISPRO 100 UNIT/ML INJECTION: 100 unit/mL | SUBCUTANEOUS | Qty: 1

## 2011-08-29 MED FILL — HEPARIN (PORCINE) 5,000 UNIT/ML IJ SOLN: 5000 unit/mL | INTRAMUSCULAR | Qty: 1

## 2011-08-29 MED FILL — OXYCODONE-ACETAMINOPHEN 7.5 MG-325 MG TAB: ORAL | Qty: 1

## 2011-08-29 MED FILL — FISH OIL 340 MG-1,000 MG CAPSULE: 340-1000 mg | ORAL | Qty: 1

## 2011-08-29 MED FILL — PANTOPRAZOLE 40 MG TAB, DELAYED RELEASE: 40 mg | ORAL | Qty: 1

## 2011-08-29 MED FILL — PLAVIX 75 MG TABLET: 75 mg | ORAL | Qty: 1

## 2011-08-29 MED FILL — TERAZOSIN 5 MG CAP: 5 mg | ORAL | Qty: 2

## 2011-08-29 MED FILL — ASPIRIN 81 MG CHEWABLE TAB: 81 mg | ORAL | Qty: 1

## 2011-08-29 MED FILL — MISOPROSTOL 200 MCG TAB: 200 mcg | ORAL | Qty: 1

## 2011-08-29 MED FILL — LEVOTHYROXINE 150 MCG TAB: 150 mcg | ORAL | Qty: 1

## 2011-08-29 MED FILL — INSULIN GLARGINE 100 UNIT/ML INJECTION: 100 unit/mL | SUBCUTANEOUS | Qty: 0.25

## 2011-08-29 MED FILL — INSULIN LISPRO 100 UNIT/ML INJECTION: 100 unit/mL | SUBCUTANEOUS | Qty: 2

## 2011-08-29 MED FILL — INSULIN GLARGINE 100 UNIT/ML INJECTION: 100 unit/mL | SUBCUTANEOUS | Qty: 0.2

## 2011-08-29 MED FILL — FAMOTIDINE 20 MG TAB: 20 mg | ORAL | Qty: 1

## 2011-08-29 MED FILL — METOPROLOL TARTRATE 50 MG TAB: 50 mg | ORAL | Qty: 2

## 2011-08-29 MED FILL — ISOSORBIDE MONONITRATE SR 30 MG 24 HR TAB: 30 mg | ORAL | Qty: 1

## 2011-08-29 NOTE — Other (Signed)
[facility] Progress Note    Recommendations/comments: Chart reviewed on R[facility] Lawman for length of stay.  BG has been elevated, but MD is increasing Lantus.  Will continue to follow as needed.    Patient is a 71 y.o. male with known Type 2 Diabetes on intensive insulin injection program at home.    A1c:   Lab Results   Component Value Date/Time    Hemoglobin A1c 8.6 08/21/2011  4:30 PM       POC Glucose last 24hrs:   Lab Results   Component Value Date/Time    POC GLUCOSE 192 08/29/2011  8:17 AM    POC GLUCOSE 204 08/28/2011  7:58 PM    POC GLUCOSE 295 08/28/2011  4:26 PM    POC GLUCOSE 334 08/28/2011 11:11 AM    POC GLUCOSE 275 08/28/2011  7:47 AM    POC GLUCOSE 279 08/27/2011  9:58 PM    POC GLUCOSE 237 08/27/2011  8:28 PM    POC GLUCOSE 231 08/27/2011  4:22 PM       Lab Results   Component Value Date/Time    Creatinine 2.02 08/29/2011  7:00 AM       Thank you.  Hitomi Slape K. Fulton Mole, RD, CDE

## 2011-08-29 NOTE — Progress Notes (Signed)
[facility] Progress Note  [facility]   [address] [place], [place] [address]   [phone]      Assessment & Plan:   ARF/CKD  ?? Better today  ?? Unclear to what extent, if any, the fluctuations in renal function are due to urinary retention  ?? I continue to suspect that this is primarily cardiorenal syndrome    AMS  ?? Improved  ?? Continue to hold gabapentin    HTN  ?? Stable    CP/CAD  ?? Medical mgmt for now    Anemia  ?? EPO + PO Fe       Subjective:   CC: f/u ARF, CKD  HPI: Cr trending down further.   ROS: No SOB/CP/N/V  Current Facility-Administered Medications   Medication Dose Route Frequency   ??? insulin glargine (LANTUS) injection 25 Units  25 Units SubCUTAneous DAILY   ??? insulin lispro (HUMALOG) injection   SubCUTAneous TIDAC   ??? ranolazine ER (RANEXA) tablet 500 mg  500 mg Oral BID   ??? epoetin alfa (EPOGEN;PROCRIT) injection 20,000 Units  20,000 Units SubCUTAneous Q7D   ??? dextrose (D50W) injection Syrg 12.5-25 g  12.5-25 g IntraVENous PRN   ??? famotidine (PEPCID) tablet 20 mg  20 mg Oral DAILY   ??? aspirin chewable tablet 81 mg  81 mg Oral DAILY   ??? atorvastatin (LIPITOR) tablet 40 mg  40 mg Oral QHS   ??? clopidogrel (PLAVIX) tablet 75 mg  75 mg Oral DAILY   ??? ferrous sulfate tablet 325 mg  325 mg Oral ACB&D   ??? fish oil-omega-3 fatty acids 340-1,000 mg capsule 1 Cap  1 Cap Oral BID   ??? isosorbide mononitrate ER (IMDUR) tablet 30 mg  30 mg Oral DAILY   ??? levothyroxine (SYNTHROID) tablet 150 mcg  150 mcg Oral ACB   ??? metoprolol (LOPRESSOR) tablet 100 mg  100 mg Oral BID   ??? misoprostol (CYTOTEC) tablet 200 mcg  200 mcg Oral BID   ??? nitroglycerin (NITROSTAT) tablet 0.4 mg  0.4 mg SubLINGual PRN   ??? oxyCODONE-acetaminophen (PERCOCET 7.5) 7.5-325 mg per tablet 1 Tab  1 Tab Oral Q4H PRN   ??? terazosin (HYTRIN) capsule 10 mg  10 mg Oral QHS   ??? acetaminophen (TYLENOL) tablet 650 mg  650 mg Oral Q4H PRN   ??? ondansetron (ZOFRAN) injection 4 mg  4 mg  IntraVENous Q4H PRN   ??? heparin (porcine) injection 5,000 Units  5,000 Units SubCUTAneous Q8H   ??? pantoprazole (PROTONIX) tablet 40 mg  40 mg Oral ACB   ??? levalbuterol (XOPENEX) nebulizer soln 0.63 mg/3 ml  0.63 mg Nebulization Q4H PRN   ??? glucose chewable tablet 16 g  4 Tab Oral PRN   ??? glucagon (GLUCAGEN) injection 1 mg  1 mg IntraMUSCular PRN          Objective:     Vitals:  Blood pressure 140/57, pulse 87, temperature 98.7 ??F (37.1 ??C), resp. rate 20, height 5\' 6"  (1.676 m), weight 88.587 kg (195 lb 4.8 oz), SpO2 96.00%.  Temp (24hrs), Avg:99.6 ??F (37.6 ??C), Min:98.5 ??F (36.9 ??C), Max:101.4 ??F (38.6 ??C)      Intake and Output:  04/04 0700 - 04/04 1859  In: 0   Out: 625 [Urine:625]  04/02 1900 - 04/04 0659  In: 600 [P.O.:600]  Out: 1800 [Urine:1800]    Physical Exam:  GENERAL ASSESSMENT: NAD, alert  CHEST: CTA  HEART: S1S2  ABDOMEN: Soft,NT, +BS  EXTREMITY: no EDEMA            Data Review      No results found for this basename: ITNL in the last 72 hours   No results found for this basename: CPK:3,CKMB:3,TROIQ:3, in the last 72 hours  Recent Labs   [facility] 08/29/11 0700 08/28/11 0450 08/27/11 0510    NA 135* 129* 133*    K 5.1 4.8 4.6    CL 102 99 99    CO2 26 24 24     BUN 43* 49* 46*    CREA 2.02* 2.72* 3.44*    GLU 160* 224* 92    PHOS -- 2.6 --    MG 2.0 2.2 2.1    CA 8.4* 8.1* 7.7*    ALB -- -- --    WBC 8.5 8.5 8.3    HGB 8.5* 8.2* 7.8*    HCT 26.1* 24.5* 23.4*    PLT 235 205 190      No results found for this basename: INR:3,PTP:3,APTT:3, in the last 72 hours  Needs: urine analysis, urine sodium, protein and creatinine  Lab Results   Component Value Date/Time    Sodium,urine random 18 08/27/2011  6:45 PM    Creatinine,urine random 180.01 08/27/2011  6:45 PM                 [facility]  [address]  [place], [place] [address]  Phone - [phone]  Fax - [phone]

## 2011-08-29 NOTE — Wound Image (Signed)
Consult received for wound assessment.  Chart reviewed, discussed w nurse.  Pt in bed, alert, oriented, somewhat reluctant but agreeable to my visit.  L great toe partial amputation.  About half of the incision line well approximated, healed, lateral side open approx 0.7x 0.2 cm, pink & yellow tissue present but clean, scant amt serous drainage on old allevyn dressing.  2nd toe incision line is healed, 3 sutures in place.  Easily palpable pedal pulse.  Toes cleansed w dermal cleanser, thoroughly dried including between toes.  5 sutures removed.  Applied acticote flex to open area and covered w dry dressing, single layer of gauze placed between gr & 2nd toe, wrapped w 2in kling.  Moisturizer applied to dry, flaky skin of foot & ankle.  Pt tolerated procedure well.  Will cont to follow as needed.   Brantley Stage RN wound/skin team

## 2011-08-29 NOTE — Progress Notes (Signed)
Following peripherally  Was scheduled to get PVRs in the office.  Not really a candidate for any intervention at present, but will get them while an inpatient to facillitate potential future plans.

## 2011-08-29 NOTE — Progress Notes (Signed)
Problem: Self Care Deficits Care Plan (Adult)  Goal: *Acute Goals and Plan of Care (Insert Text)  Occupational Therapy Goals  Initiated 08/25/2011  1. Patient will perform bathing with supervision/set-up within 7 day(s).  2. Patient will perform lower body dressing with supervision/set-up within 7 day(s).  3. Patient will perform toilet transfers with minimal assistance/contact guard assist within 7 day(s).  4. Patient will perform all aspects of toileting with supervision/set-up within 7 day(s).  5. Patient will participate in upper extremity therapeutic exercise/activities with modified independence for 10 minutes within 7 day(s).       OCCUPATIONAL THERAPY TREATMENT  Patient: Travis [name] (71 y.o. male)  Date: 08/29/2011  Diagnosis: Chest pain, unspecified  Chest pain, unspecified Chest pain, unspecified       Precautions: Fall      ASSESSMENT:   Patient continues with confusion, unaware of time of day, that he just ate breakfast, and verbalized having a harder time remembering things. Patient declined participation in UE exercises and perseverated on feeling tired. Will continue to follow and increase activity as able.   Progression toward goals:  [ ]        Improving appropriately and progressing toward goals  [X]        Improving slowly and progressing toward goals  [ ]        Not making progress toward goals and plan of care will be adjusted       PLAN:   Patient continues to benefit from skilled intervention to address the above impairments.  Continue treatment per established plan of care.  Discharge Recommendations:  Home Health, Skilled Nursing Facility and To Be Determined  Further Equipment Recommendations for Discharge:  none       SUBJECTIVE:   Patient stated ???I just wanna get in bed.???      OBJECTIVE DATA SUMMARY:   Cognitive/Behavioral Status:  Neurologic State: Alert  Orientation Level: Disoriented to place;Disoriented to situation;Disoriented to time;Oriented to person  Cognition: Decreased command  following;Decreased attention/concentration;Memory loss;Impaired decision making     Perseveration: Perseverates during conversation  Safety/Judgement: Decreased awareness of need for assistance;Decreased awareness of need for safety;Decreased insight into deficits  Functional Mobility and Transfers for ADLs:  Bed Mobility:           Scooting: CGA;Additional time;Verbal cues  Transfers:  Sit to Stand: Moderate assistance;Verbal cues;Safety concerns;Assist X1;Additional time                                       Attempted to perform ADL mobility or assist back to bed, patient declined need for assist, would not allow therapist to put gait belt on him.                                                               Balance:  Sitting: Intact  Sitting - Static: Good (unsupported)  Sitting - Dynamic: Fair (occasional)  Standing: Impaired;Without support  Standing - Static: Fair;Other (comment) (noted toes coming up and posterior lean, no righting reactio)  Standing - Dynamic : Poor  ADL Intervention:  Lower Body Dressing Assistance  Dressing Assistance: Maximum assistance  Socks: Total assistance (dependent);Compensatory technique training (unable to cross LE's and declined to trial equipment)  Leg Crossed Method Used: No  Position Performed: Seated in chair  Cues: Physical assistance;Verbal cues provided    Toileting  Toileting Assistance: Moderate assistance  Bladder Hygiene: Moderate assistance;Minimum assistance (attempted use of urninal in standing with mod assist for bal)  Clothing Management: Minimum assistance (for standing balance)  Cues: Verbal cues provided    Cognitive Retraining  Orientation Retraining: Place;Reorienting;Situation;Time  Safety/Judgement: Decreased awareness of need for assistance;Decreased awareness of need for safety;Decreased insight into deficits          Therapeutic Exercises:   Educated on benefit of exercises and need to incorporate into daily routine, educated  on benefit of UE exercises to increase activity tolerance and use of water pitcher for resistance, educated on benefit of sitting unsupported as tolerated throughout day to increase demand on trunk muscles. Attempted to facilitate bilateral UE AROM exercises however continued to decline even with education on benefit.   Pain:  Pain Scale 1: Numeric (0 - 10)  Pain Intensity 1: 0  Pain Location 1: Foot           Activity Tolerance:   Poor, agitated slightly, declines to participate due to feeling tired.   Please refer to the flowsheet for vital signs taken during this treatment.  After treatment:   [X]  Patient left in no apparent distress sitting up in chair  [ ]  Patient left in no apparent distress in bed  [X]  Call bell left within reach  [X]  Nursing notified  [ ]  Caregiver present  [X]  Bed alarm activated      COMMUNICATION/COLLABORATION:   The patient???s plan of care was discussed with: Registered Nurse    Chyrl Civatte, OTR/L  Time Calculation: 19 mins

## 2011-08-29 NOTE — Progress Notes (Signed)
Problem: Mobility Impaired (Adult and Pediatric)  Goal: *Acute Goals and Plan of Care (Insert Text)  Physical Therapy Goals  Initiated 08/24/2011  1. Patient will move from supine to sit and sit to supine in bed with independence within 7 day(s).   2. Patient will transfer from bed to chair and chair to bed with supervision/set-up using the least restrictive device within 7 day(s).  3. Patient will perform sit to stand with modified independence within 7 day(s).  4. Patient will ambulate with min assist for 75 feet with the least restrictive device within 7 day(s).   5. Patient will ascend/descend stairs with handrail(s) with minimal assistance/contact guard assist within 7 day(s), if he has stairs at his discharge location.   PHYSICAL THERAPY TREATMENT  Patient: Travis [name] (71 y.o. male)  Date: 08/29/2011  Diagnosis: Chest pain, unspecified  Chest pain, unspecified Chest pain, unspecified       Precautions: Fall, mild confusion      ASSESSMENT:   Difficult Pt received sitting up in chair, mildly confused, refuses to walk stating he has "already walked and he doesn't want to walk anymore today". Pt states he has no pain and that he wants to get back to bed. Pt stood with additional time, moderate assist and verbal cues. Pt has posterior lean in standing and is demanding, not wanting to be told what to do and reaching impulsively for things on his chair in spite of poor balance. Pt ambulated about 10 feet to his bed with moderate hand held assist and sat with supervision. Pt was able to transfer to supine and scoot independently and did a few B LE exercises in supine. Pt does better when he feels in control.  Progression toward goals:  [ ]     Improving appropriately and progressing toward goals  [X]     Improving slowly and progressing toward goals  [ ]     Not making progress toward goals and plan of care will be adjusted       PLAN:   Patient continues to benefit from skilled intervention to address the above  impairments.  Continue treatment per established plan of care.  Discharge Recommendations:  Skilled Nursing Facility  Further Equipment Recommendations for Discharge:  No change       SUBJECTIVE:   Patient stated ???That bear is fine if he stays out there but I don't want him inside.???      OBJECTIVE DATA SUMMARY:   Critical Behavior:  Neurologic State: Alert  Orientation Level: Disoriented to place;Disoriented to situation;Disoriented to time;Oriented to person  Cognition: Decreased command following;Decreased attention/concentration;Memory loss;Impaired decision making  Safety/Judgement: Decreased awareness of need for assistance;Decreased awareness of need for safety;Decreased insight into deficits  Functional Mobility Training:  Bed Mobility:           Scooting: CGA;Additional time;Verbal cues        Transfers:  Sit to Stand: Additional time;Assist X1;Moderate assistance;Verbal cues  Stand to Sit: Additional time;Supervision                             Balance:  Sitting: Intact  Sitting - Static: Good (unsupported)  Sitting - Dynamic: Fair (occasional)  Standing: Impaired  Standing - Static: Fair  Standing - Dynamic : Fair  Ambulation/Gait Training:  Distance (ft): 10 Feet (ft)  Assistive Device:  (hand held assist)  Ambulation - Level of Assistance: Moderate assistance        Gait Abnormalities:  Antalgic           Stance: Left decreased     Step Length: Left shortened;Right shortened                             Pt refused gait belt and walker - "I don't need those! I'm so tired of people telling me what to do!"  Stairs:                       Neuro Re-Education:  Worked on standing balance with moderate assist  Therapeutic Exercises:   Instructed in ankle pumps and SLR (Pt complied for about 5 reps each) and encouraged to exercise hourly  Pain:  Pain Scale 1: Numeric (0 - 10)  Pain Intensity 1: 8  Pain Location 1: Foot  Pain Orientation 1: Left        Activity Tolerance:   poor  Please refer to the flowsheet for  vital signs taken during this treatment.  After treatment:   [ ]     Patient left in no apparent distress sitting up in chair  [X]     Patient left in no apparent distress in bed  [X]     Call bell left within reach  [X]     Nursing notified  [ ]     Caregiver present  [X]     Bed alarm activated      COMMUNICATION/COLLABORATION:   The patient???s plan of care was discussed with: Registered Nurse    Kaleen Odea, PT   Time Calculation: 18 mins

## 2011-08-29 NOTE — Progress Notes (Addendum)
TRANSFER - OUT REPORT:    Verbal report given to Leah, RN(name) on Rande Lawman  being transferred to Med/Surg 5th floor(unit) for routine progression of care       Report consisted of patient???s Situation, Background, Assessment and   Recommendations(SBAR).     Information from the following report(s) SBAR, Kardex, Procedure Summary, Intake/Output and Recent Results was reviewed with the receiving nurse.    Opportunity for questions and clarification was provided.      1739: Spoke with [name] (patient's POA/daughter) informed her that patient will be moving to the 5th floor room 516. Will continue to monitor patient.

## 2011-08-29 NOTE — Progress Notes (Signed)
pvr's completed in vascular lab, final report to follow.

## 2011-08-29 NOTE — Progress Notes (Signed)
SBAR report received from night nurse.

## 2011-08-29 NOTE — Progress Notes (Signed)
[name]  [address] Travis [name] [name], [place] [address]  [phone]      Medical Progress Note      NAME:         Travis [name]   DOB:        [birth year]  MRM:        [id number]    Date/Time:  08/29/2011  8:35 AM       Subjective:     Chief Complaint:  Fells better. Denies any SOB, chest pain, fever or any new symptoms.     Objective:     Vitals:     Last 24hrs VS reviewed since prior progress note. Most recent are:    BP 140/57   Pulse 87   Temp 98.7 ??F (37.1 ??C)   Resp 20   Ht 5\' 6"  (1.676 m)   Wt 195 lb 4.8 oz   BMI 31.52 kg/m2   SpO2 96%     Intake/Output Summary (Last 24 hours) at 08/29/11 0835  Last data filed at 08/29/11 0701   Gross per 24 hour   Intake    600 ml   Output   1750 ml   Net  -1150 ml        Exam:     Physical Exam:    Gen:  Well-developed but weak and in no acute distress  Eyes:  Pale conjunctivae, PERRLA, no discharge.  ENT:  hearing intact to voice, no ottorrhea or rhinorrhea, dry mucous membranes  Neck:  Supple with no masses, thyroid non-tender and trachea central.  Pulm:  No accessory muscle use, decreased bur clear breath sounds without wheezes rales or rhonchi  Card:  No JVD or murmurs, has regular and normal S1, S2 without thrills, bruits or peripheral edema  Abd:  Soft, non-tender, non-distended, normoactive bowel sounds are present, no palpable organomegaly.   Musc:  No cyanosis or clubbing. No atrophy or deformities.  Skin:  old rashes both lowe extremities, bruising or ulcers.  Neuro: Awake and alert, CNs 2-12 intact, non focal exam. Follows commands appropriately.  Psych:  Has some insight. Oriented x 3, no hallucinations or delusions.    Medications:     Current Facility-Administered Medications   Medication Dose Route Frequency   ??? insulin glargine (LANTUS) injection 20 Units  20 Units SubCUTAneous DAILY   ??? insulin lispro (HUMALOG) injection   SubCUTAneous TIDAC   ??? ranolazine ER (RANEXA) tablet 500 mg  500 mg Oral BID   ??? epoetin alfa  (EPOGEN;PROCRIT) injection 20,000 Units  20,000 Units SubCUTAneous Q7D   ??? dextrose (D50W) injection Syrg 12.5-25 g  12.5-25 g IntraVENous PRN   ??? famotidine (PEPCID) tablet 20 mg  20 mg Oral DAILY   ??? aspirin chewable tablet 81 mg  81 mg Oral DAILY   ??? atorvastatin (LIPITOR) tablet 40 mg  40 mg Oral QHS   ??? clopidogrel (PLAVIX) tablet 75 mg  75 mg Oral DAILY   ??? ferrous sulfate tablet 325 mg  325 mg Oral ACB&D   ??? fish oil-omega-3 fatty acids 340-1,000 mg capsule 1 Cap  1 Cap Oral BID   ??? isosorbide mononitrate ER (IMDUR) tablet 30 mg  30 mg Oral DAILY   ??? levothyroxine (SYNTHROID) tablet 150 mcg  150 mcg Oral ACB   ??? metoprolol (LOPRESSOR) tablet 100 mg  100 mg Oral BID   ??? misoprostol (CYTOTEC) tablet 200 mcg  200 mcg Oral BID   ??? nitroglycerin (NITROSTAT) tablet  0.4 mg  0.4 mg SubLINGual PRN   ??? oxyCODONE-acetaminophen (PERCOCET 7.5) 7.5-325 mg per tablet 1 Tab  1 Tab Oral Q4H PRN   ??? terazosin (HYTRIN) capsule 10 mg  10 mg Oral QHS   ??? acetaminophen (TYLENOL) tablet 650 mg  650 mg Oral Q4H PRN   ??? ondansetron (ZOFRAN) injection 4 mg  4 mg IntraVENous Q4H PRN   ??? heparin (porcine) injection 5,000 Units  5,000 Units SubCUTAneous Q8H   ??? pantoprazole (PROTONIX) tablet 40 mg  40 mg Oral ACB   ??? levalbuterol (XOPENEX) nebulizer soln 0.63 mg/3 ml  0.63 mg Nebulization Q4H PRN   ??? glucose chewable tablet 16 g  4 Tab Oral PRN   ??? glucagon (GLUCAGEN) injection 1 mg  1 mg IntraMUSCular PRN        Lab data Review:     Recent Labs   [facility] 08/29/11 0700 08/28/11 0450 08/27/11 0510    WBC 8.5 8.5 8.3    HGB 8.5* 8.2* 7.8*    HCT 26.1* 24.5* 23.4*    PLT 235 205 190     Recent Labs   Basename 08/29/11 0700 08/28/11 0450 08/27/11 0510    NA 135* 129* 133*    K 5.1 4.8 4.6    CL 102 99 99    CO2 26 24 24     GLU 160* 224* 92    BUN 43* 49* 46*    CREA 2.02* 2.72* 3.44*    CA 8.4* 8.1* 7.7*    MG 2.0 2.2 2.1    PHOS -- 2.6 --    ALB -- -- --    TBIL -- -- --    SGOT -- -- --    INR -- -- --     Lab Results   Component Value  Date/Time    POC GLUCOSE 192 08/29/2011  8:17 AM    POC GLUCOSE 204 08/28/2011  7:58 PM    POC GLUCOSE 295 08/28/2011  4:26 PM    POC GLUCOSE 334 08/28/2011 11:11 AM    POC GLUCOSE 275 08/28/2011  7:47 AM       Telemetry reviewed:   normal sinus rhythm    Assessment / Plan:   Mr. Travis [name] is a 71 y.o.  African American with a hx of CAD, syst CHF EF 40%, DM, HTN, PVD, CKD, presented w/ NSTEMI, ARF     1) Chest pain/NSTEMI: Asymptomatic now. Cards following. Cont BB, nitro, plavix, ranexa. Holding cardiac MRI and cath due to ARF. Management per Cards     2) ARF/CKD: Improving now. Continue to follow renal panel. ARF initially thought to be volume related, but not improving with IVF. Holding diuretics. Renal is following.      3) Pneumonia: patchy ASDz seen on admission. This could be related to interstitial edema. Cx neg. S/p 5 days of levaquin     4) DM 2: BS elevated today. Will adjust Lantus. Has had hx of hypoglycemia due to poor PO intake. Cont SSI     5) Anemia: likely chronic, related to CKD. Also had a hx of GI bleed from AVMs. Now s/p 1u RBCs (given ischemia). Monitor closely     6) PVD: hx of bypass in legs. No ischemia per Vascular. Noted vascular note today.    7) Legs pain: likely combination of diabetic neuropathy and PVD. LE dopplers neg for DVT     8) AMS: Improving after holding oxycontin and neurontin. Head CT and ammonia wnl. Monitor for  now       Total time spent to care for the patient: 55 Minutes                  Care Plan discussed with: Patient and Nursing Staff    Discussed:  Care Plan    Prophylaxis:  Hep SQ    Disposition:  Home w/Family           ___________________________________________________    Attending Physician: Melynda Keller, MD

## 2011-08-29 NOTE — Progress Notes (Signed)
Bedside and Verbal shift change report given to Jens Som, RN  (oncoming nurse) by Jarvis Morgan., RN  (offgoing nurse).  Report given with SBAR, Kardex, Procedure Summary, Intake/Output, MAR and Recent Results.

## 2011-08-29 NOTE — Progress Notes (Signed)
Notified by PCT that patient is febrile with a temp. Of 101.3.  I assessed patient.  Skin is cool to the touch.  No patient complaints.  VS  Stable.  HR: 82.  Rechecked temp.  Oral Temp is 100.1.  Will continue to monitor.

## 2011-08-29 NOTE — Progress Notes (Signed)
TRANSFER - IN REPORT:    Verbal report received from Heather, RN(name) on Rande Lawman  being received from [facility](unit) for routine progression of care      Report consisted of patient???s Situation, Background, Assessment and   Recommendations(SBAR).     Information from the following report(s) SBAR, Kardex, Intake/Output, MAR and Recent Results was reviewed with the receiving nurse.    Opportunity for questions and clarification was provided.      Assessment completed upon patient???s arrival to unit and care assumed.

## 2011-08-29 NOTE — Progress Notes (Signed)
SBAR report given to Heather RN.

## 2011-08-29 NOTE — Progress Notes (Signed)
Patient off floor on stretcher to vascular lab for Dr. Vedia Pereyra ordered exam.

## 2011-08-29 NOTE — Progress Notes (Signed)
Pt. Awake lying in bed alert and oriented x 2 no change in assessment no signs of distress. Sinus rhythm on telemetry.

## 2011-08-30 LAB — GLUCOSE, POC
Glucose (POC): 174 mg/dL — ABNORMAL HIGH (ref 75–110)
Glucose (POC): 162 mg/dL — ABNORMAL HIGH (ref 75–110)
Glucose (POC): 201 mg/dL — ABNORMAL HIGH (ref 75–110)
Glucose (POC): 145 mg/dL — ABNORMAL HIGH (ref 75–110)

## 2011-08-30 LAB — CBC W/O DIFF
HCT: 25.9 % — ABNORMAL LOW (ref 36.6–50.3)
HGB: 8.6 g/dL — ABNORMAL LOW (ref 12.1–17.0)
MCH: 28.6 PG (ref 26.0–34.0)
MCHC: 33.2 g/dL (ref 30.0–36.5)
MCV: 86 FL (ref 80.0–99.0)
PLATELET: 244 10*3/uL (ref 150–400)
RBC: 3.01 M/uL — ABNORMAL LOW (ref 4.10–5.70)
RDW: 17.7 % — ABNORMAL HIGH (ref 11.5–14.5)
WBC: 8.6 10*3/uL (ref 4.1–11.1)

## 2011-08-30 LAB — METABOLIC PANEL, BASIC
Anion gap: 7 mmol/L (ref 5–15)
BUN/Creatinine ratio: 21 — ABNORMAL HIGH (ref 12–20)
BUN: 37 MG/DL — ABNORMAL HIGH (ref 6–20)
CO2: 27 MMOL/L (ref 21–32)
Calcium: 8.3 MG/DL — ABNORMAL LOW (ref 8.5–10.1)
Chloride: 102 MMOL/L (ref 97–108)
Creatinine: 1.77 MG/DL — ABNORMAL HIGH (ref 0.45–1.15)
GFR est AA: 46 mL/min/{1.73_m2} — ABNORMAL LOW (ref >60)
GFR est non-AA: 38 mL/min/{1.73_m2} — ABNORMAL LOW (ref >60)
Glucose: 147 MG/DL — ABNORMAL HIGH (ref 65–100)
Potassium: 5.1 MMOL/L (ref 3.5–5.1)
Sodium: 136 MMOL/L (ref 136–145)

## 2011-08-30 MED FILL — LIPITOR 20 MG TABLET: 20 mg | ORAL | Qty: 2

## 2011-08-30 MED FILL — FERROUS SULFATE 325 MG (65 MG ELEMENTAL IRON) TAB: 325 mg (65 mg iron) | ORAL | Qty: 1

## 2011-08-30 MED FILL — HEPARIN (PORCINE) 5,000 UNIT/ML IJ SOLN: 5000 unit/mL | INTRAMUSCULAR | Qty: 1

## 2011-08-30 MED FILL — INSULIN GLARGINE 100 UNIT/ML INJECTION: 100 unit/mL | SUBCUTANEOUS | Qty: 0.25

## 2011-08-30 MED FILL — MISOPROSTOL 200 MCG TAB: 200 mcg | ORAL | Qty: 1

## 2011-08-30 MED FILL — FAMOTIDINE 20 MG TAB: 20 mg | ORAL | Qty: 1

## 2011-08-30 MED FILL — ISOSORBIDE MONONITRATE SR 30 MG 24 HR TAB: 30 mg | ORAL | Qty: 1

## 2011-08-30 MED FILL — RANEXA 500 MG TABLET,EXTENDED RELEASE: 500 mg | ORAL | Qty: 1

## 2011-08-30 MED FILL — ASPIRIN 81 MG CHEWABLE TAB: 81 mg | ORAL | Qty: 1

## 2011-08-30 MED FILL — PANTOPRAZOLE 40 MG TAB, DELAYED RELEASE: 40 mg | ORAL | Qty: 1

## 2011-08-30 MED FILL — FISH OIL 340 MG-1,000 MG CAPSULE: 340-1000 mg | ORAL | Qty: 1

## 2011-08-30 MED FILL — PLAVIX 75 MG TABLET: 75 mg | ORAL | Qty: 1

## 2011-08-30 MED FILL — OXYCODONE-ACETAMINOPHEN 7.5 MG-325 MG TAB: ORAL | Qty: 1

## 2011-08-30 MED FILL — METOPROLOL TARTRATE 50 MG TAB: 50 mg | ORAL | Qty: 2

## 2011-08-30 MED FILL — TERAZOSIN 5 MG CAP: 5 mg | ORAL | Qty: 2

## 2011-08-30 MED FILL — EPOETIN ALFA 20,000 UNIT/ML IJ SOLN: 20000 unit/mL | INTRAMUSCULAR | Qty: 1

## 2011-08-30 MED FILL — LEVOTHYROXINE 150 MCG TAB: 150 mcg | ORAL | Qty: 1

## 2011-08-30 MED FILL — INSULIN LISPRO 100 UNIT/ML INJECTION: 100 unit/mL | SUBCUTANEOUS | Qty: 2

## 2011-08-30 NOTE — Progress Notes (Signed)
NUTRITION    BEST PRACTICE ALERT:   Braden Score: 18      Nutrition: Adequate  Patient Vitals for the past 100 hrs:   % Diet Eaten   08/29/11 1800 30 %   08/29/11 1319 50 %   08/29/11 0900 50 %   08/28/11 1734 75 %   08/28/11 1444 75 %   08/28/11 1048 75 %     Pt's appetite and po intakes have been improved over past few days. He is taking some of his glucerna supplements. Encouraged pt to take Glucerna when he doesn't eat as much at mealtimes, he was agreeable.     RD PLAN:   Low  nutrition risk identified at this time. Will continue supplements and f/u per standards of care.     Curt Jews. Orlene Erm, RD, CNSC

## 2011-08-30 NOTE — Progress Notes (Signed)
[place] [facility]  [address] [place], [place] [address]  [phone]      Medical Progress Note      NAME:         Travis [name]   DOB:        Oct 04, 1940  MRM:        [id number]    Date/Time:  08/30/2011  8:20 AM       Subjective:     Chief Complaint:     Fells better although he has generalized weakness. Denies any SOB, chest pain, fever or any other new symptoms.     Objective:     Vitals:     Last 24hrs VS reviewed since prior progress note. Most recent are:    BP 156/74   Pulse 100   Temp 99.5 ??F (37.5 ??C)   Resp 18   Ht 5\' 6"  (1.676 m)   Wt 195 lb 4.8 oz   BMI 31.52 kg/m2   SpO2 96%       Intake/Output Summary (Last 24 hours) at 08/30/11 0820  Last data filed at 08/30/11 0457   Gross per 24 hour   Intake   1200 ml   Output    750 ml   Net    450 ml        Exam:     Physical Exam:    Gen:  Well-developed but weak and in no acute distress  Eyes:  Pale conjunctivae, PERRLA, no discharge.  ENT:  hearing intact to voice, no ottorrhea or rhinorrhea, dry mucous membranes  Neck:  Supple with no masses, thyroid non-tender and trachea central.  Pulm:  No accessory muscle use, decreased bur clear breath sounds without wheezes rales or rhonchi  Card:  No JVD or murmurs, has regular and normal S1, S2 without thrills, bruits or peripheral edema  Abd:  Soft, non-tender, non-distended, normoactive bowel sounds are present, no palpable organomegaly.   Musc:  No cyanosis or clubbing. No atrophy or deformities.  Skin:  old rashes both lowe extremities, bruising or ulcers.  Neuro: Awake and alert, CNs 2-12 intact, non focal exam. Follows commands appropriately.  Psych:  Has some insight. Oriented x 3, no hallucinations or delusions.    Medications:     Current Facility-Administered Medications   Medication Dose Route Frequency   ??? insulin glargine (LANTUS) injection 25 Units  25 Units SubCUTAneous DAILY   ??? insulin lispro (HUMALOG) injection   SubCUTAneous TIDAC   ??? ranolazine ER (RANEXA) tablet 500  mg  500 mg Oral BID   ??? epoetin alfa (EPOGEN;PROCRIT) injection 20,000 Units  20,000 Units SubCUTAneous Q7D   ??? dextrose (D50W) injection Syrg 12.5-25 g  12.5-25 g IntraVENous PRN   ??? famotidine (PEPCID) tablet 20 mg  20 mg Oral DAILY   ??? aspirin chewable tablet 81 mg  81 mg Oral DAILY   ??? atorvastatin (LIPITOR) tablet 40 mg  40 mg Oral QHS   ??? clopidogrel (PLAVIX) tablet 75 mg  75 mg Oral DAILY   ??? ferrous sulfate tablet 325 mg  325 mg Oral ACB&D   ??? fish oil-omega-3 fatty acids 340-1,000 mg capsule 1 Cap  1 Cap Oral BID   ??? isosorbide mononitrate ER (IMDUR) tablet 30 mg  30 mg Oral DAILY   ??? levothyroxine (SYNTHROID) tablet 150 mcg  150 mcg Oral ACB   ??? metoprolol (LOPRESSOR) tablet 100 mg  100 mg Oral BID   ??? misoprostol (CYTOTEC) tablet  200 mcg  200 mcg Oral BID   ??? nitroglycerin (NITROSTAT) tablet 0.4 mg  0.4 mg SubLINGual PRN   ??? oxyCODONE-acetaminophen (PERCOCET 7.5) 7.5-325 mg per tablet 1 Tab  1 Tab Oral Q4H PRN   ??? terazosin (HYTRIN) capsule 10 mg  10 mg Oral QHS   ??? acetaminophen (TYLENOL) tablet 650 mg  650 mg Oral Q4H PRN   ??? ondansetron (ZOFRAN) injection 4 mg  4 mg IntraVENous Q4H PRN   ??? heparin (porcine) injection 5,000 Units  5,000 Units SubCUTAneous Q8H   ??? pantoprazole (PROTONIX) tablet 40 mg  40 mg Oral ACB   ??? levalbuterol (XOPENEX) nebulizer soln 0.63 mg/3 ml  0.63 mg Nebulization Q4H PRN   ??? glucose chewable tablet 16 g  4 Tab Oral PRN   ??? glucagon (GLUCAGEN) injection 1 mg  1 mg IntraMUSCular PRN        Lab data Review:     Recent Labs   Basename 08/30/11 0245 08/29/11 0700 08/28/11 0450    WBC 8.6 8.5 8.5    HGB 8.6* 8.5* 8.2*    HCT 25.9* 26.1* 24.5*    PLT 244 235 205     Recent Labs   Basename 08/30/11 0245 08/29/11 0700 08/28/11 0450    NA 136 135* 129*    K 5.1 5.1 4.8    CL 102 102 99    CO2 27 26 24     GLU 147* 160* 224*    BUN 37* 43* 49*    CREA 1.77* 2.02* 2.72*    CA 8.3* 8.4* 8.1*    MG -- 2.0 2.2    PHOS -- -- 2.6    ALB -- -- --    TBIL -- -- --    SGOT -- -- --    INR -- --  --     Lab Results   Component Value Date/Time    POC GLUCOSE 162 08/30/2011  7:12 AM    POC GLUCOSE 174 08/29/2011  9:33 PM    POC GLUCOSE 223 08/29/2011  4:26 PM    POC GLUCOSE 185 08/29/2011 11:57 AM    POC GLUCOSE 192 08/29/2011  8:17 AM       Telemetry reviewed:   normal sinus rhythm    Assessment / Plan:   Mr. Tutson is a 71 y.o.  African American with a hx of CAD, syst CHF EF 40%, DM, HTN, PVD, CKD, presented w/ NSTEMI, ARF     1) Chest pain/NSTEMI: Asymptomatic now. Cards following. Cont BB, nitro, plavix, ranexa. Holding cardiac MRI and cath due to ARF which is improving. Management per Cards     2) ARF/CKD: Better today. Continue to follow renal panel. ARF initially thought to be volume related, but not improving with IVF. Holding diuretics. Renal is following.      3) Pneumonia: patchy ASDz seen on admission. This could be related to interstitial edema. Cx neg. S/p 5 days of levaquin     4) DM 2: BS better. Continue adjusted Lantus. Has had hx of hypoglycemia due to poor PO intake. Cont SSI     5) Anemia: likely chronic, related to CKD. Also had a hx of GI bleed from AVMs. Now s/p 1u RBCs (given ischemia). Monitor closely     6) PVD: hx of bypass in legs. No ischemia per Vascular. Noted vascular note today.    7) Legs pain: likely combination of diabetic neuropathy and PVD. LE dopplers neg for DVT  8) AMS: Improving after holding oxycontin and neurontin. Head CT and ammonia wnl. Monitor for now       Total time spent to care for the patient: 30 Minutes                  Care Plan discussed with: Patient and Nursing Staff    Discussed:  Care Plan    Prophylaxis:  Hep SQ    Disposition:  Home w/Family           ___________________________________________________    Attending Physician: Melynda Keller, MD

## 2011-08-30 NOTE — Progress Notes (Signed)
Pt. Sleeping quietly no complaints.

## 2011-08-30 NOTE — Progress Notes (Signed)
[facility] Progress Note  [facility]   [address] [place], [place] [address]   [phone]      Assessment & Plan:   ARF/CKD  ?? Continues to improve  ?? Unclear to what extent, if any, the fluctuations in renal function are due to urinary retention. Cr has continued to improve after removing foley.  ?? I continue to suspect that this is primarily cardiorenal syndrome    AMS  ?? Improved  ?? Would avoid gabapentin if symptoms allow    HTN  ?? Stable    CP/CAD  ?? Medical mgmt for now    Anemia  ?? EPO + PO Fe       Subjective:   CC: f/u ARF, CKD  HPI: Cr trending down further. Foley removed yesterday.  ROS: No SOB/CP/N/V  Current Facility-Administered Medications   Medication Dose Route Frequency   ??? insulin glargine (LANTUS) injection 25 Units  25 Units SubCUTAneous DAILY   ??? insulin lispro (HUMALOG) injection   SubCUTAneous TIDAC   ??? ranolazine ER (RANEXA) tablet 500 mg  500 mg Oral BID   ??? epoetin alfa (EPOGEN;PROCRIT) injection 20,000 Units  20,000 Units SubCUTAneous Q7D   ??? dextrose (D50W) injection Syrg 12.5-25 g  12.5-25 g IntraVENous PRN   ??? famotidine (PEPCID) tablet 20 mg  20 mg Oral DAILY   ??? aspirin chewable tablet 81 mg  81 mg Oral DAILY   ??? atorvastatin (LIPITOR) tablet 40 mg  40 mg Oral QHS   ??? clopidogrel (PLAVIX) tablet 75 mg  75 mg Oral DAILY   ??? ferrous sulfate tablet 325 mg  325 mg Oral ACB&D   ??? fish oil-omega-3 fatty acids 340-1,000 mg capsule 1 Cap  1 Cap Oral BID   ??? isosorbide mononitrate ER (IMDUR) tablet 30 mg  30 mg Oral DAILY   ??? levothyroxine (SYNTHROID) tablet 150 mcg  150 mcg Oral ACB   ??? metoprolol (LOPRESSOR) tablet 100 mg  100 mg Oral BID   ??? misoprostol (CYTOTEC) tablet 200 mcg  200 mcg Oral BID   ??? nitroglycerin (NITROSTAT) tablet 0.4 mg  0.4 mg SubLINGual PRN   ??? oxyCODONE-acetaminophen (PERCOCET 7.5) 7.5-325 mg per tablet 1 Tab  1 Tab Oral Q4H PRN   ??? terazosin (HYTRIN) capsule 10 mg  10 mg Oral QHS   ???  acetaminophen (TYLENOL) tablet 650 mg  650 mg Oral Q4H PRN   ??? ondansetron (ZOFRAN) injection 4 mg  4 mg IntraVENous Q4H PRN   ??? heparin (porcine) injection 5,000 Units  5,000 Units SubCUTAneous Q8H   ??? pantoprazole (PROTONIX) tablet 40 mg  40 mg Oral ACB   ??? levalbuterol (XOPENEX) nebulizer soln 0.63 mg/3 ml  0.63 mg Nebulization Q4H PRN   ??? glucose chewable tablet 16 g  4 Tab Oral PRN   ??? glucagon (GLUCAGEN) injection 1 mg  1 mg IntraMUSCular PRN          Objective:     Vitals:  Blood pressure 156/74, pulse 100, temperature 99.5 ??F (37.5 ??C), resp. rate 18, height 5\' 6"  (1.676 m), weight 88.587 kg (195 lb 4.8 oz), SpO2 96.00%.  Temp (24hrs), Avg:98.9 ??F (37.2 ??C), Min:98.1 ??F (36.7 ??C), Max:100.1 ??F (37.8 ??C)      Intake and Output:     04/03 1900 - 04/05 0659  In: 1200 [P.O.:1200]  Out: 1550 [Urine:1550]    Physical Exam:  GENERAL ASSESSMENT: NAD, alert  CHEST: CTA  HEART: S1S2  ABDOMEN: Soft,NT, +BS  EXTREMITY: no EDEMA            Data Review      No results found for this basename: ITNL in the last 72 hours   No results found for this basename: CPK:3,CKMB:3,TROIQ:3, in the last 72 hours  Recent Labs   Basename 08/30/11 0245 08/29/11 0700 08/28/11 0450    NA 136 135* 129*    K 5.1 5.1 4.8    CL 102 102 99    CO2 27 26 24     BUN 37* 43* 49*    CREA 1.77* 2.02* 2.72*    GLU 147* 160* 224*    PHOS -- -- 2.6    MG -- 2.0 2.2    CA 8.3* 8.4* 8.1*    ALB -- -- --    WBC 8.6 8.5 8.5    HGB 8.6* 8.5* 8.2*    HCT 25.9* 26.1* 24.5*    PLT 244 235 205      No results found for this basename: INR:3,PTP:3,APTT:3, in the last 72 hours  Needs: urine analysis, urine sodium, protein and creatinine  Lab Results   Component Value Date/Time    Sodium,urine random 18 08/27/2011  6:45 PM    Creatinine,urine random 180.01 08/27/2011  6:45 PM                 [facility]  [address]  [place], [place] [address]  Phone - [phone]  Fax - [phone]

## 2011-08-30 NOTE — Progress Notes (Signed)
Problem: Mobility Impaired (Adult and Pediatric)  Goal: *Acute Goals and Plan of Care (Insert Text)  Physical Therapy Goals  Initiated 08/24/2011  1. Patient will move from supine to sit and sit to supine in bed with independence within 7 day(s).   2. Patient will transfer from bed to chair and chair to bed with supervision/set-up using the least restrictive device within 7 day(s).  3. Patient will perform sit to stand with modified independence within 7 day(s).  4. Patient will ambulate with min assist for 75 feet with the least restrictive device within 7 day(s).   5. Patient will ascend/descend stairs with handrail(s) with minimal assistance/contact guard assist within 7 day(s), if he has stairs at his discharge location.   PHYSICAL THERAPY TREATMENT  Patient: Travis [name] (71 y.o. male)  Date: 08/30/2011  Diagnosis: Chest pain, unspecified  Chest pain, unspecified Chest pain, unspecified       Precautions: Fall  Chart, physical therapy assessment, plan of care and goals were reviewed.      ASSESSMENT:   Pt to sit with min to mod assist.Pt to stand with mod assist of 1.Pt ambulated 89ft with RW min assist of 1.Pt ambulated with wedge show to unload front of foot.Pt min to CGA  back to bed.Continue goals.  Progression toward goals:  [ ]       Improving appropriately and progressing toward goals  [X]       Improving slowly and progressing toward goals  [ ]       Not making progress toward goals and plan of care will be adjusted       PLAN:   Patient continues to benefit from skilled intervention to address the above impairments.  Continue treatment per established plan of care.  Discharge Recommendations:  To Be Determined  Further Equipment Recommendations for Discharge:  rolling walker       SUBJECTIVE:         OBJECTIVE DATA SUMMARY:   Critical Behavior:  Neurologic State: Alert  Orientation Level: Disoriented to situation  Cognition: Decreased attention/concentration  Safety/Judgement: Decreased awareness of need  for assistance;Decreased awareness of need for safety;Decreased insight into deficits  Functional Mobility Training:  Bed Mobility:  Rolling: Supervision  Supine to Sit: Minimal assistance;Moderate assistance  Sit to Supine: Minimum assistance;CGA  Scooting: CGA                    Transfers:  Sit to Stand: Moderate assistance  Stand to Sit: Minimum assistance                             Balance:  Sitting: Intact  Standing: Impaired;With support  Standing - Static: Fair  Ambulation/Gait Training:  Distance (ft): 14 Feet (ft)  Assistive Device: Gait belt;Walker, rolling  Ambulation - Level of Assistance: Minimal assistance        Gait Abnormalities: Antalgic;Decreased step clearance;Path deviations                 Step Length: Left shortened;Right shortened                                     Pain:                    Activity Tolerance:   Pt tolerated treatment fair  Please refer to the flowsheet for vital signs taken  during this treatment.  After treatment:   [ ]  Patient left in no apparent distress sitting up in chair  [X]  Patient left in no apparent distress in bed  [ ]  Call bell left within reach  [ ]  Nursing notified  [ ]  Caregiver present  [ ]  Bed alarm activated      COMMUNICATION/COLLABORATION:   The patient???s plan of care was discussed with: Physical Therapist    Waneta Martins. Bolling, PTA   Time Calculation: 20 mins

## 2011-08-30 NOTE — Progress Notes (Signed)
PVRs reviewed  Actually not too bad.  Ok to remove sutures

## 2011-08-30 NOTE — Progress Notes (Signed)
Pt. Still sleeping quietly no complaints. No change in assessment.

## 2011-08-30 NOTE — Progress Notes (Signed)
Bedside and Verbal shift change report given to Nikki,RN (oncoming nurse) by Bill,RN (offgoing nurse).  Report given with SBAR, Kardex, MAR and Recent Results.

## 2011-08-30 NOTE — Progress Notes (Signed)
Bedside and Verbal shift change report given to Caroline,RN (oncoming nurse) by Nikki,RN (offgoing nurse).  Report given with SBAR, Kardex, MAR and Recent Results.

## 2011-08-30 NOTE — Procedures (Signed)
[facility]  *** FINAL REPORT ***    Name: Travis [name], Travis [name]  MRN: [id number]  DOB: [birth year]  Visit: [id number]  Date: 29 Aug 2011    TYPE OF TEST: Peripheral Arterial Testing    REASON FOR TEST  Extremity ulceration (left side)    Right Leg  Segmentals: Abnormal                     mmHg  Brachial         157  High thigh  Low thigh        109  Calf             142  Posterior tibial  91  Dorsalis pedis    78  Peroneal  Metatarsal  Toe pressure       0  Doppler:  PVR:  Ankle/Brachial: 0.58    Left Leg  Segmentals: Abnormal                     mmHg  Brachial  High thigh  Low thigh        250  Calf             150  Posterior tibial 114  Dorsalis pedis    83  Peroneal  Metatarsal  Toe pressure       0  Doppler:  PVR:  Ankle/Brachial: 0.73    INTERPRETATION/FINDINGS  PROCEDURE:  Evaluation of lower extremity arteries with multilevel  systolic blood pressure measurements and pulse volume recording (PVR)  plethysmography.  Includes calculation of the ankle/brachial pressure  indices (ABI's).    1. Moderate peripheral arterial disease in the right leg.  2. Moderate peripheral arterial disease in the left leg.  3. The right ankle/brachial index is 0.58 and the left ankle/brachial  index is 0.73.    NOTE: three cuff technique implemented.    ADDITIONAL COMMENTS    I have personally reviewed the data relevant to the interpretation of  this  study.    TECHNOLOGIST:  Vivi Ferns T    PHYSICIAN:  Electronically signed by:  Dalene Carrow. Sheliah Hatch, MD  08/30/2011 08:40 AM

## 2011-08-30 NOTE — Consults (Signed)
NEUROLOGY CONSULT NOTE    Name Travis [name] Age 71 y.o.   MRN [id number] DOB [birth year]     Consulting Physician: Melynda Keller, MD      Reason for Consult: Dementia       History of Present Illness:      His is a 71 y.o. African American male who was admitted for chest pain/ pneumonia. Mr. Deruiter with multiple medical problems including CAD, DM, HTN, anemia. He had memory loss as well as functional impairment. He was confused while he is at hospital. Head CT did not reveal any acute process. He denies weakness, paresthesia    No Known Allergies     Prior to Admission medications    Medication Sig Start Date End Date Taking? Authorizing Provider   albuterol (PROVENTIL HFA, VENTOLIN HFA) 90 mcg/actuation inhaler Take 2 Puffs by inhalation every six (6) hours as needed for Wheezing. 03/11/11  Yes Laddie Aquas, MD   oxyCODONE-acetaminophen (PERCOCET 7.5) 7.5-325 mg per tablet Take 1 Tab by mouth every four (4) hours as needed for Pain. 08/13/11   Lenn Sink, MD   fish oil-omega-3 fatty acids 340-1,000 mg capsule Take 1 Cap by mouth two (2) times a day. 07/24/11   Gaspar Bidding, MD   insulin glargine (LANTUS) 100 unit/mL injection Please take 25 units of your glargine daily 07/24/11   Gaspar Bidding, MD   metoprolol (LOPRESSOR) 100 mg tablet Take 1 Tab by mouth two (2) times a day. 07/23/11   Sherin Quarry, ACNP   atorvastatin (LIPITOR) 40 mg tablet Take 1 Tab by mouth nightly. 07/23/11   Sherin Quarry, ACNP   isosorbide mononitrate ER (IMDUR) 30 mg tablet Take 1 Tab by mouth daily. 07/23/11   Sherin Quarry, ACNP   Ferrous Sulfate (SLOW FE) 47.5 mg iron TbER tablet Take 1 Tab by mouth two (2) times a day.    Historical Provider   clopidogrel (PLAVIX) 75 mg tablet Take 1 Tab by mouth daily. 06/25/11   Laddie Aquas, MD   potassium chloride (K-DUR, KLOR-CON) 10 mEq tablet Take 1 Tab by mouth daily. 02/05/11   Laddie Aquas, MD   insulin aspart (NOVOLOG) 100 unit/mL injection by SubCUTAneous route Before  breakfast and dinner.      Historical Provider   bumetanide (BUMEX) 2 mg tablet Take 2 mg by mouth daily.    Historical Provider   misoprostol (CYTOTEC) 200 mcg tablet Take 1 Tab by mouth two (2) times a day. 01/26/10   Laddie Aquas, MD   omeprazole (PRILOSEC) 20 mg capsule Take 20 mg by mouth daily.    Historical Provider   lisinopril (PRINIVIL, ZESTRIL) 20 mg tablet Take 1 Tab by mouth nightly. 11/13/09   Laddie Aquas, MD   terazosin (HYTRIN) 5 mg capsule Take 10 mg by mouth nightly.    Historical Provider   aspirin 81 mg tablet Take  by mouth daily.    Historical Provider   levothyroxine (SYNTHROID) 150 mcg tablet Take  by mouth daily (before breakfast).    Historical Provider   nitroglycerin (NITROSTAT) 0.4 mg SL tablet by SubLINGual route every five (5) minutes as needed.    Historical Provider   ranitidine hcl (ZANTAC) 150 mg capsule Take 150 mg by mouth two (2) times a day.    Historical Provider       Past Medical History   Diagnosis Date   ??? CAD (coronary artery disease)      see problem list   ???  HTN (hypertension)    ??? PVD (peripheral vascular disease)    ??? DM (diabetes mellitus)    ??? Hyperlipidemia    ??? Sleep apnea      on CPAP   ??? COPD (chronic obstructive pulmonary disease)      mild COPD, recurrent pneumonia, chronic bronchitis (followed by Dr. Leroy Libman)   ??? CKD (chronic kidney disease)      Cr 1.7 in 10/09   ??? CHF (congestive heart failure)    ??? Anemia    ??? Pulmonary hypertension      PASP 60 on echo 06/22/09   ??? Thromboembolus      leg    ??? Thyroid disease    ??? Pneumonia    ??? GERD (gastroesophageal reflux disease)    ??? Chronic pain      back pain   ??? Cataract    ??? GI bleed      multiple bleeding episodes and blood transfusions.  Dr. Monika Salk has followed..  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   ??? Foot ulcer 07/18/2011        Past Surgical History   Procedure Date   ??? Hx orthopaedic      knee replacement   ??? Pr vascular surgery procedure unlist      arterial bypass   ??? Hx pacemaker 2.24.2011     [employer] Jude lead, model number  1488TC, serial number [id number]   ??? Pr cardiac surg procedure unlist      cardiac stent   ??? Hx cataract removal      removed rt eye        History   Substance Use Topics   ??? Smoking status: Former Smoker -- 20 years     Types: Cigarettes     Quit date: 11/09/1978   ??? Smokeless tobacco: Never Used   ??? Alcohol Use: No    retired    Family History   Problem Relation Age of Onset   ??? Hypertension     ??? Cancer Mother      cervical and colon   ??? Cancer Father      lung        Review of Systems:     A comprehensive review of systems was negative except for: Except as noted in the HPI    Exam:     BP 160/79   Pulse 79   Temp 98.9 ??F (37.2 ??C)   Resp 18   Ht 5\' 6"  (1.676 m)   Wt 88.587 kg (195 lb 4.8 oz)   BMI 31.52 kg/m2   SpO2 94%     General: Well developed, well nourished. Patient in no apparent distress   Head: Normocephalic, atraumatic, anicteric sclera   Lungs:  Clear to auscultation bilaterally, No wheezes or rubs   Cardiac: Regular rate and rhythm with no murmurs.   Abd: Bowel sounds were audible. No tenderness on palpation   Ext: No pedal edema   Skin: No overt signs of rash or insect bites     Neurological Exam:  Mental Status: alert   Speech: Fluent no aphasia or dysarthria.   Cranial Nerves:   Intact visual fields.  Facial sensation is normal. Facial movement is symmetric.  Palate is midline.  Normal sternocleidomastoid strength. Tongue is midline. Hearing is intact bilaterally.   Eyes: Fundi reveal sharp optic discs appreciable venous pulsation. PERRL, EOM's full, no nystagmus, no ptosis.  Motor:   Full and symmetric strength of upper and lower proximal and distal muscles. Normal bulk and tone.    Reflexes:   Deep tendon reflexes 2+/4 and symmetrical.   Sensory:    Symmetrically intact  with no perceived deficits modalities involving small or large fibers.   Gait:   Gait is balanced and fluid with normal arm swing.    Tremor:   No tremor noted.    Cerebellar:  Finger to nose and heel over shin to knee was demonstrated competently.Tandem gait was demonstrated with little difficulty   Neurovascular: No carotid bruits. No JVD       Imaging  CT Head: reviewed  MRI Brain:  not obtained    Lab Review  Lab Results   Component Value Date/Time    WBC 8.6 08/30/2011  2:45 AM    HCT 25.9 08/30/2011  2:45 AM    HGB 8.6 08/30/2011  2:45 AM    PLATELET 244 08/30/2011  2:45 AM     Lab Results   Component Value Date/Time    Sodium 136 08/30/2011  2:45 AM    Potassium 5.1 08/30/2011  2:45 AM    Chloride 102 08/30/2011  2:45 AM    CO2 27 08/30/2011  2:45 AM    Glucose 147 08/30/2011  2:45 AM    BUN 37 08/30/2011  2:45 AM    Creatinine 1.77 08/30/2011  2:45 AM    Calcium 8.3 08/30/2011  2:45 AM     No components found with this basename: tropquant     No components found with this basename: ana,  homo       Assessment:     ?? Principal Problem:  ??  *Chest pain, unspecified (08/21/2011)  ?? Active Problems:  ??   ??  PAD (peripheral artery disease) (05/18/2011)  ??  PVR tracing 05/18/2011 suggest bilateral SFA occlusion and calf vessel   ??  disease   ??  and high grade stenosis of his proximal fem-pop  ??  Angio March 2012 [facility]): r sfa-ak pop with prox stenosis, 1 vs r/o; left   ??  diffuse sfa , severe tpt disease  ??   ??  Acute on chronic renal failure (08/21/2011)  ??   ??  Pneumonia, organism unspecified (08/21/2011)  ??   ??  Leukocytosis, unspecified (08/21/2011)  ??   ??  DM (diabetes mellitus) (08/21/2011)  ??   ??  Anemia of other chronic disease (08/21/2011)  ??   ??  Other and unspecified hyperlipidemia (08/21/2011)  ??   ??  Fever, unspecified (08/24/2011)  ??       Plan:   Mild to Moderate Dementia: Patient developed short memory loss and functional impairment. He has mild-moderate dementia. On top of it, the encephalopathy is likely. I will add Aricept 5 mg daily. No further neurological workup.      Burnett Kanaris, MD

## 2011-08-30 NOTE — Progress Notes (Signed)
Pt. Awake sitting up in bed no change in assessment no signs of distress pt. Reported to be feeling fine.

## 2011-08-30 NOTE — Progress Notes (Signed)
Bedside and Verbal shift change report given to Tayo Maute(oncoming nurse) by Maxine Glenn (offgoing nurse).  Report given with SBAR, Kardex and MAR.

## 2011-08-31 LAB — METABOLIC PANEL, BASIC
Anion gap: 9 mmol/L (ref 5–15)
BUN/Creatinine ratio: 18 (ref 12–20)
BUN: 28 MG/DL — ABNORMAL HIGH (ref 6–20)
CO2: 27 MMOL/L (ref 21–32)
Calcium: 8 MG/DL — ABNORMAL LOW (ref 8.5–10.1)
Chloride: 102 MMOL/L (ref 97–108)
Creatinine: 1.55 MG/DL — ABNORMAL HIGH (ref 0.45–1.15)
GFR est AA: 54 mL/min/{1.73_m2} — ABNORMAL LOW (ref >60)
GFR est non-AA: 44 mL/min/{1.73_m2} — ABNORMAL LOW (ref >60)
Glucose: 171 MG/DL — ABNORMAL HIGH (ref 65–100)
Potassium: 4.7 MMOL/L (ref 3.5–5.1)
Sodium: 138 MMOL/L (ref 136–145)

## 2011-08-31 LAB — GLUCOSE, POC
Glucose (POC): 128 mg/dL — ABNORMAL HIGH (ref 75–110)
Glucose (POC): 133 mg/dL — ABNORMAL HIGH (ref 75–110)
Glucose (POC): 152 mg/dL — ABNORMAL HIGH (ref 75–110)
Glucose (POC): 188 mg/dL — ABNORMAL HIGH (ref 75–110)

## 2011-08-31 MED ORDER — MORPHINE 2 MG/ML INJECTION
2 mg/mL | INTRAMUSCULAR | Status: DC | PRN
Start: 2011-08-31 — End: 2011-09-03
  Administered 2011-08-31 – 2011-09-03 (×3): via INTRAVENOUS

## 2011-08-31 MED ORDER — FINASTERIDE 5 MG TAB
5 mg | Freq: Every day | ORAL | Status: DC
Start: 2011-08-31 — End: 2011-09-03
  Administered 2011-09-01 – 2011-09-03 (×3): via ORAL

## 2011-08-31 MED ORDER — DONEPEZIL 5 MG TAB
5 mg | Freq: Every evening | ORAL | Status: DC
Start: 2011-08-31 — End: 2011-09-03
  Administered 2011-08-31 – 2011-09-03 (×4): via ORAL

## 2011-08-31 MED FILL — LEVOTHYROXINE 150 MCG TAB: 150 mcg | ORAL | Qty: 1

## 2011-08-31 MED FILL — PLAVIX 75 MG TABLET: 75 mg | ORAL | Qty: 1

## 2011-08-31 MED FILL — TERAZOSIN 5 MG CAP: 5 mg | ORAL | Qty: 2

## 2011-08-31 MED FILL — ISOSORBIDE MONONITRATE SR 30 MG 24 HR TAB: 30 mg | ORAL | Qty: 1

## 2011-08-31 MED FILL — XOPENEX 0.63 MG/3 ML SOLUTION FOR NEBULIZATION: 0.63 mg/3 mL | RESPIRATORY_TRACT | Qty: 3

## 2011-08-31 MED FILL — INSULIN GLARGINE 100 UNIT/ML INJECTION: 100 unit/mL | SUBCUTANEOUS | Qty: 0.25

## 2011-08-31 MED FILL — MISOPROSTOL 200 MCG TAB: 200 mcg | ORAL | Qty: 1

## 2011-08-31 MED FILL — FERROUS SULFATE 325 MG (65 MG ELEMENTAL IRON) TAB: 325 mg (65 mg iron) | ORAL | Qty: 1

## 2011-08-31 MED FILL — METOPROLOL TARTRATE 50 MG TAB: 50 mg | ORAL | Qty: 2

## 2011-08-31 MED FILL — FISH OIL 340 MG-1,000 MG CAPSULE: 340-1000 mg | ORAL | Qty: 1

## 2011-08-31 MED FILL — ASPIRIN 81 MG CHEWABLE TAB: 81 mg | ORAL | Qty: 1

## 2011-08-31 MED FILL — RANEXA 500 MG TABLET,EXTENDED RELEASE: 500 mg | ORAL | Qty: 1

## 2011-08-31 MED FILL — HEPARIN (PORCINE) 5,000 UNIT/ML IJ SOLN: 5000 unit/mL | INTRAMUSCULAR | Qty: 1

## 2011-08-31 MED FILL — ARICEPT 5 MG TABLET: 5 mg | ORAL | Qty: 1

## 2011-08-31 MED FILL — FAMOTIDINE 20 MG TAB: 20 mg | ORAL | Qty: 1

## 2011-08-31 MED FILL — MORPHINE 2 MG/ML INJECTION: 2 mg/mL | INTRAMUSCULAR | Qty: 1

## 2011-08-31 MED FILL — PANTOPRAZOLE 40 MG TAB, DELAYED RELEASE: 40 mg | ORAL | Qty: 1

## 2011-08-31 MED FILL — LIPITOR 20 MG TABLET: 20 mg | ORAL | Qty: 2

## 2011-08-31 NOTE — Progress Notes (Signed)
Pt alert and oriented x3 non compliant. Requests call for help, pt verbalizes understanding. Pt states   ' I don't want to be bothered no more with vital signs." Sat noted to be 88% pt declines oxygen stating we just trying to find something wrong with him. HOB elevated sat noted to be at 90 at this time. Breathing still noted to be labored and wheezing noted on the right upper lungs bilaterally. Will cont to monitor. Charge nurse notified about the above.

## 2011-08-31 NOTE — Consults (Addendum)
Urology Consult    Subjective:     Date of Consultation:  August 31, 2011    Referring Physician: Lucia Estelle    Reason for Consultation:  AUR, CRF    History of Present Illness:   Patient is a 71 y.o. African American male who is being seen for AUR, CRF. He was admitted to the hospital for Chest pain, unspecified  Chest pain, unspecified.  Pneumonia also noted. CT 2011 no stones or hydro. 60% Rt RAS on duplex US  Foley removed and pt voiding less. Scr coming down with med mgt  Bladder scan 155 cc  I suspect he's a bit dry, using urinal, BRP    Past Medical History   Diagnosis Date   ??? CAD (coronary artery disease)      see problem list   ??? HTN (hypertension)    ??? PVD (peripheral vascular disease)    ??? DM (diabetes mellitus)    ??? Hyperlipidemia    ??? Sleep apnea      on CPAP   ??? COPD (chronic obstructive pulmonary disease)      mild COPD, recurrent pneumonia, chronic bronchitis (followed by Dr. Leroy Libman)   ??? CKD (chronic kidney disease)      Cr 1.7 in 10/09   ??? CHF (congestive heart failure)    ??? Anemia    ??? Pulmonary hypertension      PASP 60 on echo 06/22/09   ??? Thromboembolus      leg    ??? Thyroid disease    ??? Pneumonia    ??? GERD (gastroesophageal reflux disease)    ??? Chronic pain      back pain   ??? Cataract    ??? GI bleed      multiple bleeding episodes and blood transfusions.  Dr. Monika Salk has followed..  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   ??? Foot ulcer 07/18/2011      Past Surgical History   Procedure Date   ??? Hx orthopaedic      knee replacement   ??? Pr vascular surgery procedure unlist      arterial bypass   ??? Hx pacemaker 2.24.2011     [employer] lead, model number 1488TC, serial number [id number]   ??? Pr cardiac surg procedure unlist      cardiac stent   ??? Hx cataract removal      removed rt eye      Family History   Problem Relation Age of Onset   ??? Hypertension     ??? Cancer Mother      cervical and colon   ??? Cancer Father      lung      History   Substance Use Topics   ??? Smoking status: Former Smoker -- 20 years     Types:  Cigarettes     Quit date: 11/09/1978   ??? Smokeless tobacco: Never Used   ??? Alcohol Use: No     No Known Allergies   Prior to Admission medications    Medication Sig Start Date End Date Taking? Authorizing Provider   albuterol (PROVENTIL HFA, VENTOLIN HFA) 90 mcg/actuation inhaler Take 2 Puffs by inhalation every six (6) hours as needed for Wheezing. 03/11/11  Yes Laddie Aquas, MD   oxyCODONE-acetaminophen (PERCOCET 7.5) 7.5-325 mg per tablet Take 1 Tab by mouth every four (4) hours as needed for Pain. 08/13/11   Lenn Sink, MD   fish oil-omega-3 fatty acids 340-1,000 mg capsule Take 1 Cap by mouth two (2)  times a day. 07/24/11   Gaspar Bidding, MD   insulin glargine (LANTUS) 100 unit/mL injection Please take 25 units of your glargine daily 07/24/11   Gaspar Bidding, MD   metoprolol (LOPRESSOR) 100 mg tablet Take 1 Tab by mouth two (2) times a day. 07/23/11   Sherin Quarry, ACNP   atorvastatin (LIPITOR) 40 mg tablet Take 1 Tab by mouth nightly. 07/23/11   Sherin Quarry, ACNP   isosorbide mononitrate ER (IMDUR) 30 mg tablet Take 1 Tab by mouth daily. 07/23/11   Sherin Quarry, ACNP   Ferrous Sulfate (SLOW FE) 47.5 mg iron TbER tablet Take 1 Tab by mouth two (2) times a day.    Historical Provider   clopidogrel (PLAVIX) 75 mg tablet Take 1 Tab by mouth daily. 06/25/11   Laddie Aquas, MD   potassium chloride (K-DUR, KLOR-CON) 10 mEq tablet Take 1 Tab by mouth daily. 02/05/11   Laddie Aquas, MD   insulin aspart (NOVOLOG) 100 unit/mL injection by SubCUTAneous route Before breakfast and dinner.      Historical Provider   bumetanide (BUMEX) 2 mg tablet Take 2 mg by mouth daily.    Historical Provider   misoprostol (CYTOTEC) 200 mcg tablet Take 1 Tab by mouth two (2) times a day. 01/26/10   Laddie Aquas, MD   omeprazole (PRILOSEC) 20 mg capsule Take 20 mg by mouth daily.    Historical Provider   lisinopril (PRINIVIL, ZESTRIL) 20 mg tablet Take 1 Tab by mouth nightly. 11/13/09   Laddie Aquas, MD   terazosin (HYTRIN) 5 mg  capsule Take 10 mg by mouth nightly.    Historical Provider   aspirin 81 mg tablet Take  by mouth daily.    Historical Provider   levothyroxine (SYNTHROID) 150 mcg tablet Take  by mouth daily (before breakfast).    Historical Provider   nitroglycerin (NITROSTAT) 0.4 mg SL tablet by SubLINGual route every five (5) minutes as needed.    Historical Provider   ranitidine hcl (ZANTAC) 150 mg capsule Take 150 mg by mouth two (2) times a day.    Historical Provider         Review of Systems:  A comprehensive review of systems was negative.    Objective:     Patient Vitals for the past 8 hrs:   BP Temp Pulse Resp SpO2   08/31/11 0813 163/74 mmHg 98.8 ??F (37.1 ??C) 95  20  95 %   08/31/11 0455 - - - 20  -     Temp (24hrs), Avg:98.6 ??F (37 ??C), Min:97.9 ??F (36.6 ??C), Max:98.9 ??F (37.2 ??C)      Intake and Output:   04/04 1900 - 04/06 0659  In: 240 [P.O.:240]  Out: 300 [Urine:300]    Physical Exam:            General:    alert, cooperative, no distress, appears stated age                     Skin:  Normal.                HEENT:  Normal        Throat/Neck:  normal and no erythema or exudates noted.    Lymph nodes:  Cervical, supraclavicular, and axillary nodes normal.                 Lungs:  clear to auscultation bilaterally      Cardiovascular:  Regular rate  and rhythm, S1S2 present and nl pulses             Abdomen::  soft, non-tender. Bowel sounds normal. No masses,  no organomegaly             Genitalia:  penis exam: non focal circumcised, prostate exam: 2+ (50gms)          Extremities:  negative       Assessment:     Principal Problem:   *Chest pain, unspecified (08/21/2011)  Active Problems:     PAD (peripheral artery disease) (05/18/2011)   PVR tracing 05/18/2011 suggest bilateral SFA occlusion and calf vessel    disease    and high grade stenosis of his proximal fem-pop   Angio March 2012 ([facility]): r sfa-ak pop with prox stenosis, 1 vs r/o; left    diffuse sfa , severe tpt disease     Acute on chronic renal failure  (08/21/2011)     Pneumonia, organism unspecified (08/21/2011)     Leukocytosis, unspecified (08/21/2011)     DM (diabetes mellitus) (08/21/2011)     Anemia of other chronic disease (08/21/2011)     Other and unspecified hyperlipidemia (08/21/2011)     Fever, unspecified (08/24/2011)     Dementia (08/31/2011)        1. AKI improved with med mgt  2. BPH on exam and mild PVR - patient uncooperative with accurate i/os  3. Prior imaging no hydro stones or obstruction  4. No UTI or prostatitis    Plan:     1. Continue Terazosin for BPH/HTN dual mgt, consider Avodart addition  2. Consider renal US per Dr Lucia Estelle  3. Check PVR bladder scans after voids. If > 400 relpace foley with 18 Fr coude  Call for Q's    Signed By: Lowella Petties, MD                         August 31, 2011

## 2011-08-31 NOTE — Progress Notes (Signed)
Post void bladder scan 112 after voiding 100cc.

## 2011-08-31 NOTE — Progress Notes (Signed)
Patient had post void residual of 77 after voiding 75cc.

## 2011-08-31 NOTE — Progress Notes (Signed)
Bedside and Verbal shift change report given to Rayfield Citizen (Cabin crew) by Tacey Ruiz (offgoing nurse).  Report given with SBAR, Kardex and MAR.

## 2011-08-31 NOTE — Progress Notes (Signed)
Neurology Progress Note    Patient ID:  Travis [name]  [id number]  71 y.o.  1940-07-20    Subjective:      Patient feels better    Current Facility-Administered Medications   Medication Dose Route Frequency   ??? finasteride (PROSCAR) tablet 5 mg  5 mg Oral DAILY   ??? morphine injection 2 mg  2 mg IntraVENous Q4H PRN   ??? donepezil (ARICEPT) tablet 5 mg  5 mg Oral QHS   ??? insulin glargine (LANTUS) injection 25 Units  25 Units SubCUTAneous DAILY   ??? insulin lispro (HUMALOG) injection   SubCUTAneous TIDAC   ??? ranolazine ER (RANEXA) tablet 500 mg  500 mg Oral BID   ??? epoetin alfa (EPOGEN;PROCRIT) injection 20,000 Units  20,000 Units SubCUTAneous Q7D   ??? dextrose (D50W) injection Syrg 12.5-25 g  12.5-25 g IntraVENous PRN   ??? famotidine (PEPCID) tablet 20 mg  20 mg Oral DAILY   ??? aspirin chewable tablet 81 mg  81 mg Oral DAILY   ??? atorvastatin (LIPITOR) tablet 40 mg  40 mg Oral QHS   ??? clopidogrel (PLAVIX) tablet 75 mg  75 mg Oral DAILY   ??? ferrous sulfate tablet 325 mg  325 mg Oral ACB&D   ??? fish oil-omega-3 fatty acids 340-1,000 mg capsule 1 Cap  1 Cap Oral BID   ??? isosorbide mononitrate ER (IMDUR) tablet 30 mg  30 mg Oral DAILY   ??? levothyroxine (SYNTHROID) tablet 150 mcg  150 mcg Oral ACB   ??? metoprolol (LOPRESSOR) tablet 100 mg  100 mg Oral BID   ??? misoprostol (CYTOTEC) tablet 200 mcg  200 mcg Oral BID   ??? nitroglycerin (NITROSTAT) tablet 0.4 mg  0.4 mg SubLINGual PRN   ??? terazosin (HYTRIN) capsule 10 mg  10 mg Oral QHS   ??? acetaminophen (TYLENOL) tablet 650 mg  650 mg Oral Q4H PRN   ??? ondansetron (ZOFRAN) injection 4 mg  4 mg IntraVENous Q4H PRN   ??? heparin (porcine) injection 5,000 Units  5,000 Units SubCUTAneous Q8H   ??? pantoprazole (PROTONIX) tablet 40 mg  40 mg Oral ACB   ??? levalbuterol (XOPENEX) nebulizer soln 0.63 mg/3 ml  0.63 mg Nebulization Q4H PRN   ??? glucose chewable tablet 16 g  4 Tab Oral PRN   ??? glucagon (GLUCAGEN) injection 1 mg  1 mg IntraMUSCular PRN          Objective:     Patient Vitals for the past  8 hrs:   BP Temp Pulse Resp SpO2 Weight   08/31/11 1628 147/70 mmHg 98.5 ??F (36.9 ??C) 86  18  92 % -   08/31/11 1411 - - - - - 89.903 kg (198 lb 3.2 oz)   08/31/11 1327 144/65 mmHg - - - - -   08/31/11 1136 - 98.8 ??F (37.1 ??C) 78  18  94 % -       04/06 0700 - 04/06 1859  In: 480 [P.O.:480]  Out: 375 [Urine:375]  04/04 1900 - 04/06 0659  In: 240 [P.O.:240]  Out: 300 [Urine:300]    Lab Review   Recent Results (from the past 24 hour(s))   GLUCOSE, POC    Collection Time    08/30/11  9:34 PM       Component Value Range    POC GLUCOSE 128 (*) 75 - 110 mg/dL    Performed by Burnis Kingfisher (PCT)     GLUCOSE, POC    Collection Time  08/31/11  7:21 AM       Component Value Range    POC GLUCOSE 133 (*) 75 - 110 mg/dL    Performed by Victorino Dike (PCT)     GLUCOSE, POC    Collection Time    08/31/11 11:36 AM       Component Value Range    POC GLUCOSE 188 (*) 75 - 110 mg/dL    Performed by Victorino Dike (PCT)     METABOLIC PANEL, BASIC    Collection Time    08/31/11 11:56 AM       Component Value Range    Sodium 138  136 - 145 MMOL/L    Potassium 4.7  3.5 - 5.1 MMOL/L    Chloride 102  97 - 108 MMOL/L    CO2 27  21 - 32 MMOL/L    Anion gap 9  5 - 15 mmol/L    Glucose 171 (*) 65 - 100 MG/DL    BUN 28 (*) 6 - 20 MG/DL    Creatinine 1.61 (*) 0.45 - 1.15 MG/DL    BUN/Creatinine ratio 18  12 - 20      GFR est AA 54 (*) >60 ml/min/1.64m2    GFR est non-AA 44 (*) >60 ml/min/1.22m2    Calcium 8.0 (*) 8.5 - 10.1 MG/DL   GLUCOSE, POC    Collection Time    08/31/11  4:27 PM       Component Value Range    POC GLUCOSE 152 (*) 75 - 110 mg/dL    Performed by SWEAT DAVID             NEUROLOGICAL EXAM:    Appearance:  The patient is well developed, well nourished, provides a coherent history and is in no acute distress.   Mental Status:  Mood and affect appropriate.   Cranial Nerves:   Intact visual fields. Fundi are benign. PERLA, EOM's full, no nystagmus, no ptosis. Facial sensation is normal. Corneal reflexes are intact. Facial movement is  symmetric. Hearing is normal bilaterally. Palate is midline with normal sternocleidomastoid and trapezius muscles are normal. Tongue is midline.   Motor:  5/5 strength in upper and lower proximal and distal muscles. Normal bulk and tone. No fasciculations.   Reflexes:   Deep tendon reflexes 2+/4 and symmetrical.   Sensory:   Normal to touch, pinprick and vibration.   Gait:  Normal gait.   Tremor:   No tremor noted.   Cerebellar:  No cerebellar signs present.   Neurovascular:  Normal heart sounds and regular rhythm, peripheral pulses intact, and no carotid bruits.         Assessment:     Principal Problem:   *Chest pain, unspecified (08/21/2011)  Active Problems:     PAD (peripheral artery disease) (05/18/2011)   PVR tracing 05/18/2011 suggest bilateral SFA occlusion and calf vessel    disease    and high grade stenosis of his proximal fem-pop   Angio March 2012 ([facility]): r sfa-ak pop with prox stenosis, 1 vs r/o; left    diffuse sfa , severe tpt disease     Acute on chronic renal failure (08/21/2011)     Pneumonia, organism unspecified (08/21/2011)     Leukocytosis, unspecified (08/21/2011)     DM (diabetes mellitus) (08/21/2011)     Anemia of other chronic disease (08/21/2011)     Other and unspecified hyperlipidemia (08/21/2011)     Fever, unspecified (08/24/2011)     Dementia (08/31/2011)  Plan:   Mild to Moderate Dementia:      Discussed with son and daughter in details about dementia, continue Aricept 5 mg daily.          Signed:  Burnett Kanaris, MD  08/31/2011  5:01 PM

## 2011-08-31 NOTE — Progress Notes (Signed)
08/31/2011 02:52P Updated clinicals faxed to [facility] and [facility] as those are the two SNFs daughter indicated she would choose for her father.Marliss Czar

## 2011-08-31 NOTE — Progress Notes (Signed)
Bedside and Verbal shift change report given to Leah, RN (oncoming nurse) by Caroline, RN (offgoing nurse).  Report given with SBAR, Kardex, Intake/Output, MAR and Recent Results.

## 2011-08-31 NOTE — Progress Notes (Signed)
Pt found sitting on the chair, did not call for any assist. Pt also refuses the stitches to be removed from his L great toe. PT states " just leave me alone and lock my door."

## 2011-08-31 NOTE — Progress Notes (Signed)
Travis [name] Progress Note  [name]   [address] [place], [place] [address]   [phone]      Assessment & Plan:   ARF/CKD  ?? Continues to improve until yesterday,labs pending from today  ?? Unclear to what extent, if any, the fluctuations in renal function are due to urinary retention. Cr has continued to improve after removing foley.  ?? Underlying problem is cardiorenal syndrome    AMS  ?? F/by neurology    HTN  ?? labile    CP/CAD  ?? Medical mgmt for now    Anemia  ?? EPO + PO Fe       Subjective:   CC: f/u ARF, CKD  HPI: Cr was trending down yesterday.    AOZ:HYQMVHQ comfortably  Current Facility-Administered Medications   Medication Dose Route Frequency   ??? donepezil (ARICEPT) tablet 5 mg  5 mg Oral QHS   ??? insulin glargine (LANTUS) injection 25 Units  25 Units SubCUTAneous DAILY   ??? insulin lispro (HUMALOG) injection   SubCUTAneous TIDAC   ??? ranolazine ER (RANEXA) tablet 500 mg  500 mg Oral BID   ??? epoetin alfa (EPOGEN;PROCRIT) injection 20,000 Units  20,000 Units SubCUTAneous Q7D   ??? dextrose (D50W) injection Syrg 12.5-25 g  12.5-25 g IntraVENous PRN   ??? famotidine (PEPCID) tablet 20 mg  20 mg Oral DAILY   ??? aspirin chewable tablet 81 mg  81 mg Oral DAILY   ??? atorvastatin (LIPITOR) tablet 40 mg  40 mg Oral QHS   ??? clopidogrel (PLAVIX) tablet 75 mg  75 mg Oral DAILY   ??? ferrous sulfate tablet 325 mg  325 mg Oral ACB&D   ??? fish oil-omega-3 fatty acids 340-1,000 mg capsule 1 Cap  1 Cap Oral BID   ??? isosorbide mononitrate ER (IMDUR) tablet 30 mg  30 mg Oral DAILY   ??? levothyroxine (SYNTHROID) tablet 150 mcg  150 mcg Oral ACB   ??? metoprolol (LOPRESSOR) tablet 100 mg  100 mg Oral BID   ??? misoprostol (CYTOTEC) tablet 200 mcg  200 mcg Oral BID   ??? nitroglycerin (NITROSTAT) tablet 0.4 mg  0.4 mg SubLINGual PRN   ??? terazosin (HYTRIN) capsule 10 mg  10 mg Oral QHS   ??? acetaminophen (TYLENOL) tablet 650 mg  650 mg Oral Q4H PRN   ??? ondansetron (ZOFRAN)  injection 4 mg  4 mg IntraVENous Q4H PRN   ??? heparin (porcine) injection 5,000 Units  5,000 Units SubCUTAneous Q8H   ??? pantoprazole (PROTONIX) tablet 40 mg  40 mg Oral ACB   ??? levalbuterol (XOPENEX) nebulizer soln 0.63 mg/3 ml  0.63 mg Nebulization Q4H PRN   ??? glucose chewable tablet 16 g  4 Tab Oral PRN   ??? glucagon (GLUCAGEN) injection 1 mg  1 mg IntraMUSCular PRN          Objective:     Vitals:  Blood pressure 163/74, pulse 95, temperature 98.8 ??F (37.1 ??C), resp. rate 20, height 5\' 6"  (1.676 m), weight 88.587 kg (195 lb 4.8 oz), SpO2 95.00%.  Temp (24hrs), Avg:98.6 ??F (37 ??C), Min:97.9 ??F (36.6 ??C), Max:98.9 ??F (37.2 ??C)      Intake and Output:     04/04 1900 - 04/06 0659  In: 240 [P.O.:240]  Out: 300 [Urine:300]    Physical Exam:               GENERAL ASSESSMENT: NAD, alert  CHEST: CTA  HEART: S1S2  ABDOMEN: Soft,NT, +BS  EXTREMITY: no EDEMA            Data Review      No results found for this basename: ITNL in the last 72 hours   No results found for this basename: CPK:3,CKMB:3,TROIQ:3, in the last 72 hours  Recent Labs   Basename 08/30/11 0245 08/29/11 0700    NA 136 135*    K 5.1 5.1    CL 102 102    CO2 27 26    BUN 37* 43*    CREA 1.77* 2.02*    GLU 147* 160*    PHOS -- --    MG -- 2.0    CA 8.3* 8.4*    ALB -- --    WBC 8.6 8.5    HGB 8.6* 8.5*    HCT 25.9* 26.1*    PLT 244 235      No results found for this basename: INR:3,PTP:3,APTT:3, in the last 72 hours  Needs: urine analysis, urine sodium, protein and creatinine  Lab Results   Component Value Date/Time    Sodium,urine random 18 08/27/2011  6:45 PM    Creatinine,urine random 180.01 08/27/2011  6:45 PM                 [facility]  [address]  [place], [place] [address]  Phone - [phone]  Fax - [phone]

## 2011-08-31 NOTE — Progress Notes (Addendum)
[facility]  [address] [place], [place] [address]  [phone]      Medical Progress Note      NAME:         Travis [name]   DOB:        November 11, 1940  MRM:        [id number]    Date/Time:  08/31/2011  8:49 AM       Subjective:     Chief Complaint:     Fells better although he has generalized weakness. Seen by neuro and meds started. Denies any new symptoms. Much more aware.     Objective:     Vitals:     Last 24hrs VS reviewed since prior progress note. Most recent are:    BP 163/74   Pulse 95   Temp 98.8 ??F (37.1 ??C)   Resp 20   Ht 5\' 6"  (1.676 m)   Wt 195 lb 4.8 oz   BMI 31.52 kg/m2   SpO2 95%     No intake or output data in the 24 hours ending 08/31/11 0849     Exam:     Physical Exam:    Gen:  Well-developed but weak and in no acute distress  Eyes:  Pale conjunctivae, PERRLA, no discharge.  ENT:  hearing intact to voice, no ottorrhea or rhinorrhea, dry mucous membranes  Neck:  Supple with no masses, thyroid non-tender and trachea central.  Pulm:  No accessory muscle use, decreased bur clear breath sounds without wheezes rales or rhonchi  Card:  No JVD or murmurs, has regular and normal S1, S2 without thrills, bruits or peripheral edema  Abd:  Soft, non-tender, non-distended, normoactive bowel sounds are present, no palpable organomegaly.   Musc:  No cyanosis or clubbing. No atrophy or deformities.  Skin:  old rashes both lowe extremities, bruising or ulcers.  Neuro: Awake and alert, CNs 2-12 intact, non focal exam. Follows commands appropriately.  Psych:  Has little insight. Oriented x 2, no hallucinations or delusions.    Medications:     Current Facility-Administered Medications   Medication Dose Route Frequency   ??? donepezil (ARICEPT) tablet 5 mg  5 mg Oral QHS   ??? insulin glargine (LANTUS) injection 25 Units  25 Units SubCUTAneous DAILY   ??? insulin lispro (HUMALOG) injection   SubCUTAneous TIDAC   ??? ranolazine ER (RANEXA) tablet 500 mg  500 mg Oral BID   ??? epoetin alfa  (EPOGEN;PROCRIT) injection 20,000 Units  20,000 Units SubCUTAneous Q7D   ??? dextrose (D50W) injection Syrg 12.5-25 g  12.5-25 g IntraVENous PRN   ??? famotidine (PEPCID) tablet 20 mg  20 mg Oral DAILY   ??? aspirin chewable tablet 81 mg  81 mg Oral DAILY   ??? atorvastatin (LIPITOR) tablet 40 mg  40 mg Oral QHS   ??? clopidogrel (PLAVIX) tablet 75 mg  75 mg Oral DAILY   ??? ferrous sulfate tablet 325 mg  325 mg Oral ACB&D   ??? fish oil-omega-3 fatty acids 340-1,000 mg capsule 1 Cap  1 Cap Oral BID   ??? isosorbide mononitrate ER (IMDUR) tablet 30 mg  30 mg Oral DAILY   ??? levothyroxine (SYNTHROID) tablet 150 mcg  150 mcg Oral ACB   ??? metoprolol (LOPRESSOR) tablet 100 mg  100 mg Oral BID   ??? misoprostol (CYTOTEC) tablet 200 mcg  200 mcg Oral BID   ??? nitroglycerin (NITROSTAT) tablet 0.4 mg  0.4 mg SubLINGual PRN   ???  terazosin (HYTRIN) capsule 10 mg  10 mg Oral QHS   ??? acetaminophen (TYLENOL) tablet 650 mg  650 mg Oral Q4H PRN   ??? ondansetron (ZOFRAN) injection 4 mg  4 mg IntraVENous Q4H PRN   ??? heparin (porcine) injection 5,000 Units  5,000 Units SubCUTAneous Q8H   ??? pantoprazole (PROTONIX) tablet 40 mg  40 mg Oral ACB   ??? levalbuterol (XOPENEX) nebulizer soln 0.63 mg/3 ml  0.63 mg Nebulization Q4H PRN   ??? glucose chewable tablet 16 g  4 Tab Oral PRN   ??? glucagon (GLUCAGEN) injection 1 mg  1 mg IntraMUSCular PRN        Lab data Review:     Recent Labs   Basename 08/30/11 0245 08/29/11 0700    WBC 8.6 8.5    HGB 8.6* 8.5*    HCT 25.9* 26.1*    PLT 244 235     Recent Labs   Basename 08/30/11 0245 08/29/11 0700    NA 136 135*    K 5.1 5.1    CL 102 102    CO2 27 26    GLU 147* 160*    BUN 37* 43*    CREA 1.77* 2.02*    CA 8.3* 8.4*    MG -- 2.0    PHOS -- --    ALB -- --    TBIL -- --    SGOT -- --    INR -- --     Lab Results   Component Value Date/Time    POC GLUCOSE 133 08/31/2011  7:21 AM    POC GLUCOSE 128 08/30/2011  9:34 PM    POC GLUCOSE 145 08/30/2011  4:27 PM    POC GLUCOSE 201 08/30/2011 11:26 AM    POC GLUCOSE 162 08/30/2011  7:12 AM        Telemetry reviewed:   normal sinus rhythm    Assessment / Plan:   Travis [name] is a 71 y.o.  African American with a hx of CAD, syst CHF EF 40%, DM, HTN, PVD, CKD, presented w/ NSTEMI, ARF     1) Chest pain/NSTEMI: Asymptomatic now. Cards following. Cont BB, nitro, plavix, ranexa. Holding cardiac MRI and cath due to ARF which is improving. Management per Cards. Daughter confirms that he has previously declined to have testing done. Am unsure if he will consent to this.    2) ARF/CKD: Better today. Continue to follow renal panel. ARF initially thought to be volume related, but not improving with IVF. Holding diuretics. Discussed with renal. May need urology evaluation. Will consult urology prior to discharge.      3) Pneumonia: patchy ASDz seen on admission. This could be related to interstitial edema. Cx neg. S/p 5 days of levaquin     4) DM 2: BS better. Continue adjusted Lantus. Has had hx of hypoglycemia due to poor PO intake. Cont SSI     5) Anemia: likely chronic, related to CKD. Also had a hx of GI bleed from AVMs. Now s/p 1u RBCs (given ischemia). Monitor closely     6) PVD: hx of bypass in legs. No ischemia per Vascular. Noted vascular note today.    7) Legs pain: likely combination of diabetic neuropathy and PVD. LE dopplers neg for DVT     8) AMS: Improving after holding oxycontin and neurontin. Head CT and ammonia wnl. Neuro reviewed and determined he has moderate dementia. Aricept started. Monitor for now  Care Plan discussed with: Patient and Nursing Staff/Family/Subspecialists    Discussed:  Care Plan    Prophylaxis:  Hep SQ    Disposition:  Home w/Family           ___________________________________________________    Attending Physician: Melynda Keller, MD

## 2011-09-01 LAB — METABOLIC PANEL, BASIC
Anion gap: 8 mmol/L (ref 5–15)
BUN/Creatinine ratio: 15 (ref 12–20)
BUN: 22 MG/DL — ABNORMAL HIGH (ref 6–20)
CO2: 27 MMOL/L (ref 21–32)
Calcium: 7.9 MG/DL — ABNORMAL LOW (ref 8.5–10.1)
Chloride: 102 MMOL/L (ref 97–108)
Creatinine: 1.43 MG/DL — ABNORMAL HIGH (ref 0.45–1.15)
GFR est AA: 59 mL/min/{1.73_m2} — ABNORMAL LOW (ref >60)
GFR est non-AA: 49 mL/min/{1.73_m2} — ABNORMAL LOW (ref >60)
Glucose: 207 MG/DL — ABNORMAL HIGH (ref 65–100)
Potassium: 4.6 MMOL/L (ref 3.5–5.1)
Sodium: 137 MMOL/L (ref 136–145)

## 2011-09-01 LAB — GLUCOSE, POC
Glucose (POC): 239 mg/dL — ABNORMAL HIGH (ref 75–110)
Glucose (POC): 180 mg/dL — ABNORMAL HIGH (ref 75–110)
Glucose (POC): 215 mg/dL — ABNORMAL HIGH (ref 75–110)
Glucose (POC): 228 mg/dL — ABNORMAL HIGH (ref 75–110)

## 2011-09-01 MED ORDER — INSULIN GLARGINE 100 UNIT/ML INJECTION
100 unit/mL | Freq: Every day | SUBCUTANEOUS | Status: DC
Start: 2011-09-01 — End: 2011-09-03
  Administered 2011-09-02 – 2011-09-03 (×2): via SUBCUTANEOUS

## 2011-09-01 MED FILL — HEPARIN (PORCINE) 5,000 UNIT/ML IJ SOLN: 5000 unit/mL | INTRAMUSCULAR | Qty: 1

## 2011-09-01 MED FILL — PANTOPRAZOLE 40 MG TAB, DELAYED RELEASE: 40 mg | ORAL | Qty: 1

## 2011-09-01 MED FILL — RANEXA 500 MG TABLET,EXTENDED RELEASE: 500 mg | ORAL | Qty: 1

## 2011-09-01 MED FILL — FERROUS SULFATE 325 MG (65 MG ELEMENTAL IRON) TAB: 325 mg (65 mg iron) | ORAL | Qty: 1

## 2011-09-01 MED FILL — INSULIN LISPRO 100 UNIT/ML INJECTION: 100 unit/mL | SUBCUTANEOUS | Qty: 1

## 2011-09-01 MED FILL — FISH OIL 340 MG-1,000 MG CAPSULE: 340-1000 mg | ORAL | Qty: 1

## 2011-09-01 MED FILL — MISOPROSTOL 200 MCG TAB: 200 mcg | ORAL | Qty: 1

## 2011-09-01 MED FILL — INSULIN GLARGINE 100 UNIT/ML INJECTION: 100 unit/mL | SUBCUTANEOUS | Qty: 0.25

## 2011-09-01 MED FILL — INSULIN GLARGINE 100 UNIT/ML INJECTION: 100 unit/mL | SUBCUTANEOUS | Qty: 0.3

## 2011-09-01 MED FILL — INSULIN LISPRO 100 UNIT/ML INJECTION: 100 unit/mL | SUBCUTANEOUS | Qty: 2

## 2011-09-01 MED FILL — FAMOTIDINE 20 MG TAB: 20 mg | ORAL | Qty: 1

## 2011-09-01 MED FILL — METOPROLOL TARTRATE 50 MG TAB: 50 mg | ORAL | Qty: 2

## 2011-09-01 MED FILL — LEVOTHYROXINE 150 MCG TAB: 150 mcg | ORAL | Qty: 1

## 2011-09-01 MED FILL — ARICEPT 5 MG TABLET: 5 mg | ORAL | Qty: 1

## 2011-09-01 MED FILL — TERAZOSIN 5 MG CAP: 5 mg | ORAL | Qty: 2

## 2011-09-01 MED FILL — ISOSORBIDE MONONITRATE SR 30 MG 24 HR TAB: 30 mg | ORAL | Qty: 1

## 2011-09-01 MED FILL — PLAVIX 75 MG TABLET: 75 mg | ORAL | Qty: 1

## 2011-09-01 MED FILL — LIPITOR 20 MG TABLET: 20 mg | ORAL | Qty: 2

## 2011-09-01 MED FILL — ACETAMINOPHEN 325 MG TABLET: 325 mg | ORAL | Qty: 2

## 2011-09-01 MED FILL — ASPIRIN 81 MG CHEWABLE TAB: 81 mg | ORAL | Qty: 1

## 2011-09-01 MED FILL — FINASTERIDE 5 MG TAB: 5 mg | ORAL | Qty: 1

## 2011-09-01 NOTE — Progress Notes (Signed)
Post void residual 84 cc,voided 100cc

## 2011-09-01 NOTE — Progress Notes (Signed)
S:  Pt voiding ok, not complete  On terazosin 10 mg    O:  Void about 100-150 cc, PVR about same  Abd soft NT  Scr improving 1.43    A/P  AKI no major component of BOO since Scr decreasing with borderline voids  Continue alpha blocker - consider switch to tamsulosin  Added finasteride  Pt does not want foley unless worsens

## 2011-09-01 NOTE — Discharge Summary (Signed)
Physician Interim Discharge Summary     Patient ID:  Travis [name]  [id number]  71 y.o.  09-07-1940    PCP: Sol Blazing, MD    Admit date: 08/21/2011    Summary date and time: 09/01/2011    Summary covers time period:     Admission Diagnoses:   Chest pain, unspecified    Current Diagnoses:    Principal Diagnosis   Chest pain, unspecified   NSTEMI                                         PAD (peripheral artery disease) (05/18/2011)   Acute on chronic renal failure (08/21/2011)   Pneumonia, organism unspecified (08/21/2011)   Leukocytosis, unspecified (08/21/2011)   DM (diabetes mellitus) (08/21/2011)   Anemia of other chronic disease (08/21/2011)   Other and unspecified hyperlipidemia (08/21/2011)    Dementia (08/31/2011)     Medications:  Current Facility-Administered Medications   Medication Dose Route Frequency   ??? insulin glargine (LANTUS) injection 30 Units  30 Units SubCUTAneous DAILY   ??? finasteride (PROSCAR) tablet 5 mg  5 mg Oral DAILY   ??? morphine injection 2 mg  2 mg IntraVENous Q4H PRN   ??? donepezil (ARICEPT) tablet 5 mg  5 mg Oral QHS   ??? insulin lispro (HUMALOG) injection   SubCUTAneous TIDAC   ??? ranolazine ER (RANEXA) tablet 500 mg  500 mg Oral BID   ??? epoetin alfa (EPOGEN;PROCRIT) injection 20,000 Units  20,000 Units SubCUTAneous Q7D   ??? dextrose (D50W) injection Syrg 12.5-25 g  12.5-25 g IntraVENous PRN   ??? famotidine (PEPCID) tablet 20 mg  20 mg Oral DAILY   ??? aspirin chewable tablet 81 mg  81 mg Oral DAILY   ??? atorvastatin (LIPITOR) tablet 40 mg  40 mg Oral QHS   ??? clopidogrel (PLAVIX) tablet 75 mg  75 mg Oral DAILY   ??? ferrous sulfate tablet 325 mg  325 mg Oral ACB&D   ??? fish oil-omega-3 fatty acids 340-1,000 mg capsule 1 Cap  1 Cap Oral BID   ??? isosorbide mononitrate ER (IMDUR) tablet 30 mg  30 mg Oral DAILY   ??? levothyroxine (SYNTHROID) tablet 150 mcg  150 mcg Oral ACB   ??? metoprolol (LOPRESSOR) tablet 100 mg  100 mg Oral BID   ??? misoprostol (CYTOTEC) tablet 200 mcg  200 mcg Oral BID   ??? nitroglycerin  (NITROSTAT) tablet 0.4 mg  0.4 mg SubLINGual PRN   ??? terazosin (HYTRIN) capsule 10 mg  10 mg Oral QHS   ??? acetaminophen (TYLENOL) tablet 650 mg  650 mg Oral Q4H PRN   ??? ondansetron (ZOFRAN) injection 4 mg  4 mg IntraVENous Q4H PRN   ??? heparin (porcine) injection 5,000 Units  5,000 Units SubCUTAneous Q8H   ??? pantoprazole (PROTONIX) tablet 40 mg  40 mg Oral ACB   ??? levalbuterol (XOPENEX) nebulizer soln 0.63 mg/3 ml  0.63 mg Nebulization Q4H PRN   ??? glucose chewable tablet 16 g  4 Tab Oral PRN   ??? glucagon (GLUCAGEN) injection 1 mg  1 mg IntraMUSCular PRN     Current exam:    Gen: Well-developed but weak and in no acute distress   Eyes: Pale conjunctivae, PERRLA, no discharge.   ENT: hearing intact to voice, no ottorrhea or rhinorrhea, dry mucous membranes   Neck: Supple with no masses, thyroid non-tender and trachea central.  Pulm: No accessory muscle use, decreased bur clear breath sounds without wheezes rales or rhonchi   Card: No JVD or murmurs, has regular and normal S1, S2 without thrills, bruits or peripheral edema   Abd: Soft, non-tender, non-distended, normoactive bowel sounds are present, no palpable organomegaly.   Musc: No cyanosis or clubbing. No atrophy or deformities.   Skin: old rashes both lowe extremities, bruising or ulcers.   Neuro: Awake and alert, CNs 2-12 intact, non focal exam. Follows commands appropriately.   Psych: Has little insight. Oriented x 2, no hallucinations or delusions.    Hospital Course (by problem or diagnosis):     1) Chest pain/NSTEMI: Asymptomatic now. Was seen by cardiology and is on BB, nitro, plavix, ranexa. Holding cardiac MRI and cath due to ARF. Daughter confirms that he has previously declined to have testing done. Am unsure if he will consent to this. He can go to SNF and follow up with cardiology    2) ARF/CKD: Improved.  ARF initially thought to be volume related, but not improving with IVF. Held diuretics. Being seen by renal and urology. Appreciate their input. On  terazosin and finasteride.     3) Pneumonia: patchy ASDz seen on admission. This could be related to interstitial edema. Cx neg. S/p 5 days of levaquin     4) DM 2: BS variable. Eating better. Adjust Lantus and continue SSI.      5) Anemia: likely chronic, related to CKD. Also had a hx of GI bleed from AVMs. s/p 1u RBCs. H/H stable.     6) PVD: hx of bypass in legs. No ischemia per Vascular.      7) Legs pain: likely combination of diabetic neuropathy and PVD. LE dopplers neg for DVT     8) AMS: Improving after holding oxycontin and neurontin. Head CT and ammonia wnl. Neuro reviewed and determined he has moderate dementia. Discussed with daughter. Aricept started. Awaiting SNF placement. Likely to leave Monday.     Final summary will be prepared by discharging hospitalist at actual time of hospital discharge.    Signed:  Melynda Keller, MD  09/01/2011  3:16 PMpHYSICI

## 2011-09-01 NOTE — Progress Notes (Signed)
Bedside and Verbal shift change report given to Leah, RN (oncoming nurse) by Caroline, RN (offgoing nurse).  Report given with SBAR, Kardex, Intake/Output, MAR and Recent Results.

## 2011-09-01 NOTE — Progress Notes (Signed)
[place] [facility]  [address] [place], [place] [address]  [phone]      Medical Progress Note      NAME:         Travis [name]   DOB:        04/24/1941  MRM:        [id number]    Date/Time:  09/01/2011  12:58 PM       Subjective:     Chief Complaint:   Fells better although still weak. Seen by neuro, urology and meds started. Denies any new symptoms. Much more awake.     Objective:     Vitals:     Last 24hrs VS reviewed since prior progress note. Most recent are:    BP 159/70   Pulse 80   Temp 99.1 ??F (37.3 ??C)   Resp 20   Ht 5\' 6"  (1.676 m)   Wt 198 lb 3.2 oz   BMI 31.99 kg/m2   SpO2 93%       Intake/Output Summary (Last 24 hours) at 09/01/11 1252  Last data filed at 09/01/11 1028   Gross per 24 hour   Intake    480 ml   Output   1150 ml   Net   -670 ml        Exam:     Physical Exam:    Gen:  Well-developed but weak and in no acute distress  Eyes:  Pale conjunctivae, PERRLA, no discharge.  ENT:  hearing intact to voice, no ottorrhea or rhinorrhea, dry mucous membranes  Neck:  Supple with no masses, thyroid non-tender and trachea central.  Pulm:  No accessory muscle use, decreased bur clear breath sounds without wheezes rales or rhonchi  Card:  No JVD or murmurs, has regular and normal S1, S2 without thrills, bruits or peripheral edema  Abd:  Soft, non-tender, non-distended, normoactive bowel sounds are present, no palpable organomegaly.   Musc:  No cyanosis or clubbing. No atrophy or deformities.  Skin:  old rashes both lowe extremities, bruising or ulcers.  Neuro: Awake and alert, CNs 2-12 intact, non focal exam. Follows commands appropriately.  Psych:  Has little insight. Oriented x 2, no hallucinations or delusions.    Medications:     Current Facility-Administered Medications   Medication Dose Route Frequency   ??? finasteride (PROSCAR) tablet 5 mg  5 mg Oral DAILY   ??? morphine injection 2 mg  2 mg IntraVENous Q4H PRN   ??? donepezil (ARICEPT) tablet 5 mg  5 mg Oral QHS   ??? insulin  glargine (LANTUS) injection 25 Units  25 Units SubCUTAneous DAILY   ??? insulin lispro (HUMALOG) injection   SubCUTAneous TIDAC   ??? ranolazine ER (RANEXA) tablet 500 mg  500 mg Oral BID   ??? epoetin alfa (EPOGEN;PROCRIT) injection 20,000 Units  20,000 Units SubCUTAneous Q7D   ??? dextrose (D50W) injection Syrg 12.5-25 g  12.5-25 g IntraVENous PRN   ??? famotidine (PEPCID) tablet 20 mg  20 mg Oral DAILY   ??? aspirin chewable tablet 81 mg  81 mg Oral DAILY   ??? atorvastatin (LIPITOR) tablet 40 mg  40 mg Oral QHS   ??? clopidogrel (PLAVIX) tablet 75 mg  75 mg Oral DAILY   ??? ferrous sulfate tablet 325 mg  325 mg Oral ACB&D   ??? fish oil-omega-3 fatty acids 340-1,000 mg capsule 1 Cap  1 Cap Oral BID   ??? isosorbide mononitrate ER (IMDUR) tablet 30 mg  30 mg Oral DAILY   ??? levothyroxine (SYNTHROID) tablet 150 mcg  150 mcg Oral ACB   ??? metoprolol (LOPRESSOR) tablet 100 mg  100 mg Oral BID   ??? misoprostol (CYTOTEC) tablet 200 mcg  200 mcg Oral BID   ??? nitroglycerin (NITROSTAT) tablet 0.4 mg  0.4 mg SubLINGual PRN   ??? terazosin (HYTRIN) capsule 10 mg  10 mg Oral QHS   ??? acetaminophen (TYLENOL) tablet 650 mg  650 mg Oral Q4H PRN   ??? ondansetron (ZOFRAN) injection 4 mg  4 mg IntraVENous Q4H PRN   ??? heparin (porcine) injection 5,000 Units  5,000 Units SubCUTAneous Q8H   ??? pantoprazole (PROTONIX) tablet 40 mg  40 mg Oral ACB   ??? levalbuterol (XOPENEX) nebulizer soln 0.63 mg/3 ml  0.63 mg Nebulization Q4H PRN   ??? glucose chewable tablet 16 g  4 Tab Oral PRN   ??? glucagon (GLUCAGEN) injection 1 mg  1 mg IntraMUSCular PRN        Lab data Review:     Recent Labs   [facility] 08/30/11 0245    WBC 8.6    HGB 8.6*    HCT 25.9*    PLT 244     Recent Labs   [facility] 09/01/11 0917 08/31/11 1156 08/30/11 0245    NA 137 138 136    K 4.6 4.7 5.1    CL 102 102 102    CO2 27 27 27     GLU 207* 171* 147*    BUN 22* 28* 37*    CREA 1.43* 1.55* 1.77*    CA 7.9* 8.0* 8.3*    MG -- -- --    PHOS -- -- --    ALB -- -- --    TBIL -- -- --    SGOT -- -- --    INR --  -- --     Lab Results   Component Value Date/Time    POC GLUCOSE 215 09/01/2011 12:14 PM    POC GLUCOSE 180 09/01/2011  7:39 AM    POC GLUCOSE 239 08/31/2011  9:18 PM    POC GLUCOSE 152 08/31/2011  4:27 PM    POC GLUCOSE 188 08/31/2011 11:36 AM       Telemetry reviewed:   normal sinus rhythm    Assessment / Plan:   Mr. Scantlebury is a 71 y.o.  African American with a hx of CAD, syst CHF EF 40%, DM, HTN, PVD, CKD, presented w/ NSTEMI, ARF     1) Chest pain/NSTEMI: Asymptomatic now. Cards following. Cont BB, nitro, plavix, ranexa. Holding cardiac MRI and cath due to ARF which is improving. Management per Cards. Daughter confirms that he has previously declined to have testing done. Am unsure if he will consent to this.    2) ARF/CKD: Better today. Continue to follow renal panel. ARF initially thought to be volume related, but not improving with IVF. Holding diuretics. Discussed with renal. Seen by urology. Appreciate their input. On terazosin and finasteride.       3) Pneumonia: patchy ASDz seen on admission. This could be related to interstitial edema. Cx neg. S/p 5 days of levaquin     4) DM 2: BS high. Eating better. Adjust Lantus. Has had hx of hypoglycemia due to poor PO intake. Cont SSI     5) Anemia: likely chronic, related to CKD. Also had a hx of GI bleed from AVMs. Now s/p 1u RBCs (given ischemia). Monitor closely     6) PVD:  hx of bypass in legs. No ischemia per Vascular. Noted vascular note today.    7) Legs pain: likely combination of diabetic neuropathy and PVD. LE dopplers neg for DVT     8) AMS: Improving after holding oxycontin and neurontin. Head CT and ammonia wnl. Neuro reviewed and determined he has moderate dementia. Aricept started. Awaiting SNF placement. Likely to leave Monday.                    Care Plan discussed with: Patient and Nursing Staff/Family/Subspecialists    Discussed:  Care Plan    Prophylaxis:  Hep SQ    Disposition:  Home w/Family            ___________________________________________________    Attending Physician: Melynda Keller, MD

## 2011-09-01 NOTE — Progress Notes (Signed)
After voiding 100 cc, post void residual was 158

## 2011-09-02 LAB — URINALYSIS W/ REFLEX CULTURE
Bacteria: NEGATIVE /HPF
Bilirubin: NEGATIVE
Blood: NEGATIVE
Glucose: NEGATIVE MG/DL
Ketone: NEGATIVE MG/DL
Leukocyte Esterase: NEGATIVE
Nitrites: NEGATIVE
Protein: 30 MG/DL — AB
Specific gravity: 1.013 (ref 1.003–1.030)
Urobilinogen: 1 EU/DL (ref 0.2–1.0)
pH (UA): 5.5 (ref 5.0–8.0)

## 2011-09-02 LAB — GLUCOSE, POC
Glucose (POC): 237 mg/dL — ABNORMAL HIGH (ref 75–110)
Glucose (POC): 191 mg/dL — ABNORMAL HIGH (ref 75–110)
Glucose (POC): 232 mg/dL — ABNORMAL HIGH (ref 75–110)
Glucose (POC): 167 mg/dL — ABNORMAL HIGH (ref 75–110)

## 2011-09-02 MED ORDER — FAMOTIDINE 20 MG TAB
20 mg | ORAL_TABLET | Freq: Every day | ORAL | Status: AC
Start: 2011-09-02 — End: ?

## 2011-09-02 MED ORDER — DONEPEZIL 5 MG TAB
5 mg | ORAL_TABLET | Freq: Every evening | ORAL | Status: AC
Start: 2011-09-02 — End: ?

## 2011-09-02 MED ORDER — BUMETANIDE 1 MG TAB
1 mg | Freq: Every day | ORAL | Status: DC
Start: 2011-09-02 — End: 2011-09-03
  Administered 2011-09-02 – 2011-09-03 (×2): via ORAL

## 2011-09-02 MED ORDER — ISOSORBIDE MONONITRATE SR 30 MG 24 HR TAB
30 mg | Freq: Every day | ORAL | Status: DC
Start: 2011-09-02 — End: 2011-09-03
  Administered 2011-09-03: 13:00:00 via ORAL

## 2011-09-02 MED ORDER — BUMETANIDE 2 MG TAB
2 mg | ORAL_TABLET | Freq: Every day | ORAL | Status: DC
Start: 2011-09-02 — End: 2011-11-21

## 2011-09-02 MED ORDER — RANOLAZINE SR 500 MG 12 HR TAB
500 mg | ORAL_TABLET | Freq: Two times a day (BID) | ORAL | Status: AC
Start: 2011-09-02 — End: ?

## 2011-09-02 MED ORDER — AMLODIPINE 5 MG TAB
5 mg | Freq: Every day | ORAL | Status: DC
Start: 2011-09-02 — End: 2011-09-03
  Administered 2011-09-02 – 2011-09-03 (×2): via ORAL

## 2011-09-02 MED ORDER — FINASTERIDE 5 MG TAB
5 mg | ORAL_TABLET | Freq: Every day | ORAL | Status: AC
Start: 2011-09-02 — End: ?

## 2011-09-02 MED ORDER — OXYCODONE-ACETAMINOPHEN 5 MG-325 MG TAB
5-325 mg | ORAL_TABLET | ORAL | Status: DC | PRN
Start: 2011-09-02 — End: 2011-10-09

## 2011-09-02 MED ORDER — AMMONIUM LACTATE 12 % LOTION
12 % | Freq: Two times a day (BID) | CUTANEOUS | Status: DC
Start: 2011-09-02 — End: 2011-09-03
  Administered 2011-09-02 – 2011-09-03 (×3): via TOPICAL

## 2011-09-02 MED ORDER — AMMONIUM LACTATE 12 % LOTION
12 % | Freq: Two times a day (BID) | CUTANEOUS | Status: DC
Start: 2011-09-02 — End: 2011-09-02

## 2011-09-02 MED ORDER — AMLODIPINE 5 MG TAB
5 mg | ORAL_TABLET | Freq: Every day | ORAL | Status: DC
Start: 2011-09-02 — End: 2011-10-09

## 2011-09-02 MED ORDER — INSULIN GLARGINE 100 UNIT/ML INJECTION
100 unit/mL | Freq: Every day | SUBCUTANEOUS | Status: AC
Start: 2011-09-02 — End: ?

## 2011-09-02 MED FILL — AMMONIUM LACTATE 12 % LOTION: 12 % | CUTANEOUS | Qty: 225

## 2011-09-02 MED FILL — LIPITOR 20 MG TABLET: 20 mg | ORAL | Qty: 2

## 2011-09-02 MED FILL — RANEXA 500 MG TABLET,EXTENDED RELEASE: 500 mg | ORAL | Qty: 1

## 2011-09-02 MED FILL — FERROUS SULFATE 325 MG (65 MG ELEMENTAL IRON) TAB: 325 mg (65 mg iron) | ORAL | Qty: 1

## 2011-09-02 MED FILL — PLAVIX 75 MG TABLET: 75 mg | ORAL | Qty: 1

## 2011-09-02 MED FILL — FINASTERIDE 5 MG TAB: 5 mg | ORAL | Qty: 1

## 2011-09-02 MED FILL — MISOPROSTOL 200 MCG TAB: 200 mcg | ORAL | Qty: 1

## 2011-09-02 MED FILL — PANTOPRAZOLE 40 MG TAB, DELAYED RELEASE: 40 mg | ORAL | Qty: 1

## 2011-09-02 MED FILL — FISH OIL 340 MG-1,000 MG CAPSULE: 340-1000 mg | ORAL | Qty: 1

## 2011-09-02 MED FILL — INSULIN GLARGINE 100 UNIT/ML INJECTION: 100 unit/mL | SUBCUTANEOUS | Qty: 0.3

## 2011-09-02 MED FILL — ASPIRIN 81 MG CHEWABLE TAB: 81 mg | ORAL | Qty: 1

## 2011-09-02 MED FILL — AMLODIPINE 5 MG TAB: 5 mg | ORAL | Qty: 1

## 2011-09-02 MED FILL — TERAZOSIN 5 MG CAP: 5 mg | ORAL | Qty: 2

## 2011-09-02 MED FILL — INSULIN LISPRO 100 UNIT/ML INJECTION: 100 unit/mL | SUBCUTANEOUS | Qty: 2

## 2011-09-02 MED FILL — LEVOTHYROXINE 150 MCG TAB: 150 mcg | ORAL | Qty: 1

## 2011-09-02 MED FILL — METOPROLOL TARTRATE 50 MG TAB: 50 mg | ORAL | Qty: 2

## 2011-09-02 MED FILL — MORPHINE 2 MG/ML INJECTION: 2 mg/mL | INTRAMUSCULAR | Qty: 1

## 2011-09-02 MED FILL — ISOSORBIDE MONONITRATE SR 30 MG 24 HR TAB: 30 mg | ORAL | Qty: 1

## 2011-09-02 MED FILL — FAMOTIDINE 20 MG TAB: 20 mg | ORAL | Qty: 1

## 2011-09-02 MED FILL — ARICEPT 5 MG TABLET: 5 mg | ORAL | Qty: 1

## 2011-09-02 MED FILL — HEPARIN (PORCINE) 5,000 UNIT/ML IJ SOLN: 5000 unit/mL | INTRAMUSCULAR | Qty: 1

## 2011-09-02 MED FILL — BUMETANIDE 1 MG TAB: 1 mg | ORAL | Qty: 1

## 2011-09-02 NOTE — Progress Notes (Signed)
[place] [facility]  [address] [place], [place] [address]  [phone]      Medical Progress Note      NAME:         Travis [name]   DOB:        1940/08/04  MRM:        [id number]    Date/Time:  09/02/2011  12:58 PM       Subjective:     Chief Complaint:   "I am just mad"    Extensive family meeting with son, daughter and patient regarding current treatment plan, decision to pursue cardiac MRI and dispo situation. Patient states he wants to be transferred to a different floor because he's "mad"; unable to elaborate more and perseverating on this point. Denies SOB or CP.     Objective:     Vitals:     Last 24hrs VS reviewed since prior progress note. Most recent are:    BP 142/68   Pulse 82   Temp 98.6 ??F (37 ??C)   Resp 18   Ht 5\' 6"  (1.676 m)   Wt 198 lb 3.2 oz   BMI 31.99 kg/m2   SpO2 93%       Intake/Output Summary (Last 24 hours) at 09/02/11 1603  Last data filed at 09/02/11 1546   Gross per 24 hour   Intake    120 ml   Output    150 ml   Net    -30 ml        Exam:     Physical Exam:    Gen:  Well-developed but weak and in no acute distress  Eyes:  Pale conjunctivae, PERRLA, no discharge.  ENT:  hearing intact to voice, no ottorrhea or rhinorrhea, dry mucous membranes  Neck:  Supple with no masses, thyroid non-tender and trachea central.  Pulm:  No accessory muscle use, decreased bur clear breath sounds without wheezes rales or rhonchi  Card:  No JVD or murmurs, has regular and normal S1, S2 without thrills, bruits or peripheral edema  Abd:  Soft, non-tender, non-distended, normoactive bowel sounds are present, no palpable organomegaly.   Musc:  No cyanosis or clubbing. No atrophy or deformities.  Skin:  old rashes both lowe extremities, bruising or ulcers.  Neuro: Awake and alert, CNs 2-12 intact, non focal exam. Follows commands appropriately.  Psych:  Has little insight. Oriented x 2, no hallucinations or delusions.    Medications:     Current Facility-Administered Medications    Medication Dose Route Frequency   ??? amLODIPine (NORVASC) tablet 5 mg  5 mg Oral DAILY   ??? isosorbide mononitrate ER (IMDUR) tablet 60 mg  60 mg Oral DAILY   ??? bumetanide (BUMEX) tablet 1 mg  1 mg Oral DAILY   ??? ammonium lactate (LAC-HYDRIN) 12 % lotion   Topical BID   ??? insulin glargine (LANTUS) injection 30 Units  30 Units SubCUTAneous DAILY   ??? finasteride (PROSCAR) tablet 5 mg  5 mg Oral DAILY   ??? morphine injection 2 mg  2 mg IntraVENous Q4H PRN   ??? donepezil (ARICEPT) tablet 5 mg  5 mg Oral QHS   ??? insulin lispro (HUMALOG) injection   SubCUTAneous TIDAC   ??? ranolazine ER (RANEXA) tablet 500 mg  500 mg Oral BID   ??? epoetin alfa (EPOGEN;PROCRIT) injection 20,000 Units  20,000 Units SubCUTAneous Q7D   ??? dextrose (D50W) injection Syrg 12.5-25 g  12.5-25 g IntraVENous PRN   ???  famotidine (PEPCID) tablet 20 mg  20 mg Oral DAILY   ??? aspirin chewable tablet 81 mg  81 mg Oral DAILY   ??? atorvastatin (LIPITOR) tablet 40 mg  40 mg Oral QHS   ??? clopidogrel (PLAVIX) tablet 75 mg  75 mg Oral DAILY   ??? ferrous sulfate tablet 325 mg  325 mg Oral ACB&D   ??? fish oil-omega-3 fatty acids 340-1,000 mg capsule 1 Cap  1 Cap Oral BID   ??? levothyroxine (SYNTHROID) tablet 150 mcg  150 mcg Oral ACB   ??? metoprolol (LOPRESSOR) tablet 100 mg  100 mg Oral BID   ??? misoprostol (CYTOTEC) tablet 200 mcg  200 mcg Oral BID   ??? nitroglycerin (NITROSTAT) tablet 0.4 mg  0.4 mg SubLINGual PRN   ??? terazosin (HYTRIN) capsule 10 mg  10 mg Oral QHS   ??? acetaminophen (TYLENOL) tablet 650 mg  650 mg Oral Q4H PRN   ??? ondansetron (ZOFRAN) injection 4 mg  4 mg IntraVENous Q4H PRN   ??? heparin (porcine) injection 5,000 Units  5,000 Units SubCUTAneous Q8H   ??? pantoprazole (PROTONIX) tablet 40 mg  40 mg Oral ACB   ??? levalbuterol (XOPENEX) nebulizer soln 0.63 mg/3 ml  0.63 mg Nebulization Q4H PRN   ??? glucose chewable tablet 16 g  4 Tab Oral PRN   ??? glucagon (GLUCAGEN) injection 1 mg  1 mg IntraMUSCular PRN        Lab data Review:     No results found for this  basename: WBC:3,HGB:3,HCT:3,PLT:3 in the last 72 hours  Recent Labs   [facility] 09/01/11 0917 08/31/11 1156    NA 137 138    K 4.6 4.7    CL 102 102    CO2 27 27    GLU 207* 171*    BUN 22* 28*    CREA 1.43* 1.55*    CA 7.9* 8.0*    MG -- --    PHOS -- --    ALB -- --    TBIL -- --    SGOT -- --    INR -- --     Lab Results   Component Value Date/Time    POC GLUCOSE 191 09/02/2011 11:48 AM    POC GLUCOSE 232 09/02/2011  7:18 AM    POC GLUCOSE 237 09/01/2011  9:00 PM    POC GLUCOSE 228 09/01/2011  4:32 PM    POC GLUCOSE 215 09/01/2011 12:14 PM       Assessment / Plan:   Mr. Asher is a 71 y.o.  African American with a hx of CAD, syst CHF EF 40%, DM, HTN, PVD, CKD, presented w/ NSTEMI, ARF     1) Chest pain/NSTEMI: Asymptomatic now. Cards following. Cont BB, nitro, plavix, ranexa. Holding cardiac MRI and cath due to ARF which is improving.   -family decided with patient that they would like to have a cardiac MRI; this can be done as an outpatient per Dr. Welton Flakes when creatinine further improves   -appreciate cardiology rec's; pt currently chest pain free   -to continue plavix, ASA, ranolazine, nitrate, beta blockade    2) ARF/CKD: Continue to follow renal panel. ARF initially thought to be volume related, but not improving with IVF. Holding diuretics. Discussed with renal. Seen by urology. Appreciate their input. On terazosin and finasteride.   -at some point in the future will need to resume ACE I; pt with f/u in one week with cardiology; to address at that time   -resume  Bumex at reduced dose (1mg  daily)      3) Pneumonia: patchy ASDz seen on admission. This could be related to interstitial edema. Cx neg. S/p 5 days of levaquin     4) DM 2: BS high. Eating better. Adjusted Lantus. Has had hx of hypoglycemia due to poor PO intake. Cont SSI     5) Anemia: likely chronic, related to CKD. Also had a hx of GI bleed from AVMs. Now s/p 1u RBCs (given ischemia). Monitor closely especially in the setting of multiple blood thinners    6)  PVD: hx of bypass in legs. No ischemia per Vascular. Appreciate wound cares assistance.     7) Legs pain: likely combination of diabetic neuropathy and PVD. LE dopplers neg for DVT     8) AMS: Improving after holding oxycontin and neurontin. Head CT and ammonia wnl. Neuro reviewed and determined he has moderate dementia. Aricept started. Awaiting SNF placement.                    Care Plan discussed with: Patient and Nursing Staff/Family/Cardiology/Case management     Time Spent: 45 minutes     Discussed:  Care Plan    Prophylaxis:  Hep SQ    Disposition:  Home w/Family           ___________________________________________________    Attending Physician: Gaspar Bidding, MD

## 2011-09-02 NOTE — Progress Notes (Addendum)
Tharon Aquas, MD. [facility]  [address], [address]  [place], [place] [address]  Ph [phone];   Fax [phone]      Patient: Travis [name]  DOB: 06-Dec-1940      Today's Date: 09/02/2011          CARDIOLOGY PROGRESS NOTE  S: No chest pain.  Feeling stronger today.  Notes some LE swelling.   O: Physical Exam:  BP 176/78   Pulse 98   Temp 98.2 ??F (36.8 ??C)   Resp 18   Ht 5\' 6"  (1.676 m)   Wt 198 lb 3.2 oz (89.903 kg)   BMI 31.99 kg/m2   SpO2 97%  Patient NAD  HEENT:  Normocephalic, atraumatic.   Neck Exam: Supple   Lung Exam: Clear to auscultation, even breath sounds.   Cardiac Exam: Regular rate and rhythm with no murmur  Abdomen: Soft, non-tender, non-distended.  Extremities: 1+ lower extremity edema.    ROS - no belly pain or SOB       Current Facility-Administered Medications   Medication Dose Route Frequency   ??? amLODIPine (NORVASC) tablet 5 mg  5 mg Oral DAILY   ??? isosorbide mononitrate ER (IMDUR) tablet 60 mg  60 mg Oral DAILY   ??? insulin glargine (LANTUS) injection 30 Units  30 Units SubCUTAneous DAILY   ??? finasteride (PROSCAR) tablet 5 mg  5 mg Oral DAILY   ??? morphine injection 2 mg  2 mg IntraVENous Q4H PRN   ??? donepezil (ARICEPT) tablet 5 mg  5 mg Oral QHS   ??? DISCONTD: insulin glargine (LANTUS) injection 25 Units  25 Units SubCUTAneous DAILY   ??? insulin lispro (HUMALOG) injection   SubCUTAneous TIDAC   ??? ranolazine ER (RANEXA) tablet 500 mg  500 mg Oral BID   ??? epoetin alfa (EPOGEN;PROCRIT) injection 20,000 Units  20,000 Units SubCUTAneous Q7D   ??? dextrose (D50W) injection Syrg 12.5-25 g  12.5-25 g IntraVENous PRN   ??? famotidine (PEPCID) tablet 20 mg  20 mg Oral DAILY   ??? aspirin chewable tablet 81 mg  81 mg Oral DAILY   ??? atorvastatin (LIPITOR) tablet 40 mg  40 mg Oral QHS   ??? clopidogrel (PLAVIX) tablet 75 mg  75 mg Oral DAILY   ??? ferrous sulfate tablet 325 mg  325 mg Oral ACB&D   ??? fish oil-omega-3 fatty acids 340-1,000 mg capsule 1 Cap  1 Cap Oral BID   ??? levothyroxine (SYNTHROID)  tablet 150 mcg  150 mcg Oral ACB   ??? metoprolol (LOPRESSOR) tablet 100 mg  100 mg Oral BID   ??? misoprostol (CYTOTEC) tablet 200 mcg  200 mcg Oral BID   ??? nitroglycerin (NITROSTAT) tablet 0.4 mg  0.4 mg SubLINGual PRN   ??? terazosin (HYTRIN) capsule 10 mg  10 mg Oral QHS   ??? acetaminophen (TYLENOL) tablet 650 mg  650 mg Oral Q4H PRN   ??? ondansetron (ZOFRAN) injection 4 mg  4 mg IntraVENous Q4H PRN   ??? heparin (porcine) injection 5,000 Units  5,000 Units SubCUTAneous Q8H   ??? pantoprazole (PROTONIX) tablet 40 mg  40 mg Oral ACB   ??? levalbuterol (XOPENEX) nebulizer soln 0.63 mg/3 ml  0.63 mg Nebulization Q4H PRN   ??? glucose chewable tablet 16 g  4 Tab Oral PRN   ??? glucagon (GLUCAGEN) injection 1 mg  1 mg IntraMUSCular PRN   ??? DISCONTD: isosorbide mononitrate ER (IMDUR) tablet 30 mg  30 mg Oral DAILY  Recent Results (from the past 24 hour(s))   GLUCOSE, POC    Collection Time    09/01/11 12:14 PM       Component Value Range    POC GLUCOSE 215 (*) 75 - 110 mg/dL    Performed by Victorino Dike (PCT)     GLUCOSE, POC    Collection Time    09/01/11  4:32 PM       Component Value Range    POC GLUCOSE 228 (*) 75 - 110 mg/dL    Performed by Victorino Dike (PCT)     GLUCOSE, POC    Collection Time    09/01/11  9:00 PM       Component Value Range    POC GLUCOSE 237 (*) 75 - 110 mg/dL    Performed by Luciana Axe         Assessment and Plan:  1) CAD (recent NSTEMI)  - continue medical management for CAD.  An attempt at PCI of CTO (chronic total occlusion) can be considered, but he will need a cardiac MRI for viability beforehand.  Family at this time is not interested in a high risk PCI (for CTO), but may consider it later.  Mr. 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   Will plan to restart an ACE-I later if renal function stable (will recheck as outpatient).   -  continue beta-blocker  - Will restart his diuretic at a lower dose (Bumex at 1 mg daily)    3) HTN - BP high.  CCB and diuretic added today    4) Mr. Bonny is to see me on April 18th.     Will sign off, but please call with any questions.     Tharon Aquas, MD, [facility]

## 2011-09-02 NOTE — Wound Image (Signed)
Patient re assessed per nursing concerned about foot wound incision well approximated had build up of sweat/ moist between all remaining toes and on plantar surface of foot.   Foot cleaned extensively and dried peeling skin removed.  Recommend wash foot with attention to between toe pat dry apply acticoat over Amputation site and dry gauze between remaining toes then wrap foot in kling . Lac hydrin to dry skin on left lower leg.

## 2011-09-02 NOTE — Progress Notes (Signed)
Problem: Self Care Deficits Care Plan (Adult)  Goal: *Acute Goals and Plan of Care (Insert Text)  Occupational Therapy Goals  Initiated 08/25/2011  1. Patient will perform bathing with supervision/set-up within 7 day(s).  2. Patient will perform lower body dressing with supervision/set-up within 7 day(s).  3. Patient will perform toilet transfers with minimal assistance/contact guard assist within 7 day(s).  4. Patient will perform all aspects of toileting with supervision/set-up within 7 day(s).  5. Patient will participate in upper extremity therapeutic exercise/activities with modified independence for 10 minutes within 7 day(s).       OCCUPATIONAL THERAPY TREATMENT  Patient: Travis [name] (71 y.o. male)  Date: 09/02/2011  Diagnosis: Chest pain, unspecified  Chest pain, unspecified Chest pain, unspecified       Precautions: Fall  Chart, occupational therapy assessment, plan of care, and goals were reviewed.      ASSESSMENT:   Pt somewhat sarcastic towards performing self care tasks seated in char verbalizing "I can bathe myself" and insisting he does not need an assistive device when ambulating to the bathroom. He was able to bathe his anterior self seated in chair but did not stand up to wash peri area/buttocks. He needed min assist to stand and contact guard to brush his teeth at the sink. Recommend skilled nursing  Progression toward goals:  [ ]           Improving appropriately and progressing toward goals  [X]           Improving slowly and progressing toward goals  [ ]           Not making progress toward goals and plan of care will be adjusted       PLAN:   Patient continues to benefit from skilled intervention to address the above impairments.  Continue treatment per established plan of care.  Discharge Recommendations: Skilled nursing facility  Further Equipment Recommendations for Discharge: None       SUBJECTIVE:   Patient stated ???I can bathe myself.???      OBJECTIVE DATA SUMMARY:   Cognitive/Behavioral  Status:  Neurologic State: Appropriate for age  Orientation Level: Oriented X4  Cognition: Follows commands           Functional Mobility and Transfers for ADLs:              Bed Mobility:Not tested as pt already up in chair.                          Transfers:  Sit to Stand: Minimum assistance                                                                                                     Balance:  Sitting: Intact  Standing: Impaired  Standing - Static: Good  Standing - Dynamic : Fair  ADL Intervention:       Grooming  Washing Hands: Supervision/set-up              Upper Body Dressing Assistance  Hospital Gown: Supervision/ set-up  Toileting  Toileting Assistance: Supervision/set up           Pain:  Pain Scale 1: Numeric (0 - 10)  Pain Intensity 1: 0                Activity Tolerance:    Fair  Please refer to the flowsheet for vital signs taken during this treatment.  After treatment:   [X]   Patient left in no apparent distress sitting up in chair  [ ]   Patient left in no apparent distress in bed  [X]   Call bell left within reach  [ ]   Nursing notified  [ ]   Caregiver present  [ ]   Bed alarm activated      COMMUNICATION/COLLABORATION:   The patient???s plan of care was discussed with: Occupational Therapist    Lurena Joiner A. Hite, COTA/L  Time Calculation: 25 mins

## 2011-09-02 NOTE — Progress Notes (Signed)
Problem: Mobility Impaired (Adult and Pediatric)  Goal: *Acute Goals and Plan of Care (Insert Text)  Physical Therapy Goals  Initiated 08/24/2011  1. Patient will move from supine to sit and sit to supine in bed with independence within 7 day(s).   2. Patient will transfer from bed to chair and chair to bed with supervision/set-up using the least restrictive device within 7 day(s).  3. Patient will perform sit to stand with modified independence within 7 day(s).  4. Patient will ambulate with min assist for 75 feet with the least restrictive device within 7 day(s).   5. Patient will ascend/descend stairs with handrail(s) with minimal assistance/contact guard assist within 7 day(s), if he has stairs at his discharge location.   PHYSICAL THERAPY TREATMENT  Patient: Travis [name] (71 y.o. male)  Date: 09/02/2011  Diagnosis: Chest pain, unspecified  Chest pain, unspecified Chest pain, unspecified       Precautions: Fall      ASSESSMENT:   Pt received up in chair and agrees to walk. Pt donned offloading boot with minimal assist, stood using rocking technique with moderate assist x1 and extra time, and ambulated about 60 feet with rolling walker and minimal assist. Pt is eager to get out of the hospital and is more compliant with therapist today.  Progression toward goals:  [X]     Improving appropriately and progressing toward goals  [ ]     Improving slowly and progressing toward goals  [ ]     Not making progress toward goals and plan of care will be adjusted       PLAN:   Patient continues to benefit from skilled intervention to address the above impairments.  Continue treatment per established plan of care.  Discharge Recommendations:  Skilled Nursing Facility  Further Equipment Recommendations for Discharge:  No change       SUBJECTIVE:   Patient stated ???I can walk.???      OBJECTIVE DATA SUMMARY:   Critical Behavior:  Neurologic State: Alert;Confused (confusion at times. )  Orientation Level: Oriented to person;Oriented  to place;Disoriented to time;Disoriented to situation  Cognition: Follows commands;Impaired decision making  Safety/Judgement: Decreased awareness of need for assistance;Decreased awareness of need for safety;Decreased insight into deficits  Functional Mobility Training:  Bed Mobility:                    Transfers:  Sit to Stand: Moderate assistance;Assist X1;Additional time;Verbal cues  Stand to Sit: Supervision                             Balance:  Sitting: Intact  Standing: Impaired  Standing - Static: Good  Standing - Dynamic : Fair  Ambulation/Gait Training:  Distance (ft): 60 Feet (ft)  Assistive Device: Walker, rolling  Ambulation - Level of Assistance: Minimal assistance        Gait Abnormalities: Antalgic           Stance: Left decreased     Step Length: Left shortened;Right shortened                               Stairs:                       Neuro Re-Education:    Therapeutic Exercises:     Pain:  Pain Scale 1: Numeric (0 - 10)  Pain Intensity 1:  0              Activity Tolerance:   fair  Please refer to the flowsheet for vital signs taken during this treatment.  After treatment:   [X]     Patient left in no apparent distress sitting up in chair  [ ]     Patient left in no apparent distress in bed  [X]     Call bell left within reach  [X]     Nursing notified  [ ]     Caregiver present  [ ]     Bed alarm activated      COMMUNICATION/COLLABORATION:   The patient???s plan of care was discussed with: Registered Nurse    Kaleen Odea, PT   Time Calculation: 18 mins

## 2011-09-02 NOTE — Progress Notes (Signed)
Bedside and Verbal shift change report given to Tamara Pfeifer RN (oncoming nurse) by Barbara RN (offgoing nurse).  Report given with SBAR, Kardex and Recent Results.

## 2011-09-02 NOTE — Progress Notes (Signed)
No voiding complaints.    Will arrange outpatient follow up.

## 2011-09-02 NOTE — Progress Notes (Signed)
[facility] Progress Note  [facility]   [address] [place], [place] [address]   [phone]      Assessment & Plan:   ARF/CKD  ?? Continues to improve    ?? Underlying problem is cardiorenal syndrome  ?? BOO: No foley:Urology following, on Hytrin and Proscar    AMS  ?? F/by neurology    HTN  ?? Labile:elevated  ?? Add CCB today  ?? If no contrast planned would like to add Diuretics eventually for BP control    CP/CAD  ?? Medical mgmt for now    Anemia  ?? EPO + PO Fe       Subjective:   CC: f/u ARF, CKD  HPI: Cr   trending down yesterday.    ROS: NO CP/SOB/N/V/ABD PAIN  Current Facility-Administered Medications   Medication Dose Route Frequency   ??? insulin glargine (LANTUS) injection 30 Units  30 Units SubCUTAneous DAILY   ??? finasteride (PROSCAR) tablet 5 mg  5 mg Oral DAILY   ??? morphine injection 2 mg  2 mg IntraVENous Q4H PRN   ??? donepezil (ARICEPT) tablet 5 mg  5 mg Oral QHS   ??? insulin lispro (HUMALOG) injection   SubCUTAneous TIDAC   ??? ranolazine ER (RANEXA) tablet 500 mg  500 mg Oral BID   ??? epoetin alfa (EPOGEN;PROCRIT) injection 20,000 Units  20,000 Units SubCUTAneous Q7D   ??? dextrose (D50W) injection Syrg 12.5-25 g  12.5-25 g IntraVENous PRN   ??? famotidine (PEPCID) tablet 20 mg  20 mg Oral DAILY   ??? aspirin chewable tablet 81 mg  81 mg Oral DAILY   ??? atorvastatin (LIPITOR) tablet 40 mg  40 mg Oral QHS   ??? clopidogrel (PLAVIX) tablet 75 mg  75 mg Oral DAILY   ??? ferrous sulfate tablet 325 mg  325 mg Oral ACB&D   ??? fish oil-omega-3 fatty acids 340-1,000 mg capsule 1 Cap  1 Cap Oral BID   ??? isosorbide mononitrate ER (IMDUR) tablet 30 mg  30 mg Oral DAILY   ??? levothyroxine (SYNTHROID) tablet 150 mcg  150 mcg Oral ACB   ??? metoprolol (LOPRESSOR) tablet 100 mg  100 mg Oral BID   ??? misoprostol (CYTOTEC) tablet 200 mcg  200 mcg Oral BID   ??? nitroglycerin (NITROSTAT) tablet 0.4 mg  0.4 mg SubLINGual PRN   ??? terazosin (HYTRIN) capsule 10 mg  10 mg Oral QHS    ??? acetaminophen (TYLENOL) tablet 650 mg  650 mg Oral Q4H PRN   ??? ondansetron (ZOFRAN) injection 4 mg  4 mg IntraVENous Q4H PRN   ??? heparin (porcine) injection 5,000 Units  5,000 Units SubCUTAneous Q8H   ??? pantoprazole (PROTONIX) tablet 40 mg  40 mg Oral ACB   ??? levalbuterol (XOPENEX) nebulizer soln 0.63 mg/3 ml  0.63 mg Nebulization Q4H PRN   ??? glucose chewable tablet 16 g  4 Tab Oral PRN   ??? glucagon (GLUCAGEN) injection 1 mg  1 mg IntraMUSCular PRN          Objective:     Vitals:  Blood pressure 176/78, pulse 98, temperature 98.2 ??F (36.8 ??C), resp. rate 18, height 5\' 6"  (1.676 m), weight 89.903 kg (198 lb 3.2 oz), SpO2 97.00%.  Temp (24hrs), Avg:98.7 ??F (37.1 ??C), Min:98.1 ??F (36.7 ??C), Max:99.4 ??F (37.4 ??C)      Intake and Output:     04/06 1900 - 04/08 0659  In: 480 [P.O.:480]  Out: 1000 [Urine:1000]  Physical Exam:               GENERAL ASSESSMENT: NAD, alert  CHEST: CTA  HEART: S1S2  ABDOMEN: Soft,NT, +BS  EXTREMITY: no EDEMA            Data Review      No results found for this basename: ITNL in the last 72 hours   No results found for this basename: CPK:3,CKMB:3,TROIQ:3, in the last 72 hours  Recent Labs   [facility] 09/01/11 0917 08/31/11 1156    NA 137 138    K 4.6 4.7    CL 102 102    CO2 27 27    BUN 22* 28*    CREA 1.43* 1.55*    GLU 207* 171*    PHOS -- --    MG -- --    CA 7.9* 8.0*    ALB -- --    WBC -- --    HGB -- --    HCT -- --    PLT -- --      No results found for this basename: INR:3,PTP:3,APTT:3, in the last 72 hours  Needs: urine analysis, urine sodium, protein and creatinine  Lab Results   Component Value Date/Time    Sodium,urine random 18 08/27/2011  6:45 PM    Creatinine,urine random 180.01 08/27/2011  6:45 PM                 [facility]  [address]  [place], [place] [address]  Phone - [phone]  Fax - [phone]

## 2011-09-02 NOTE — Progress Notes (Signed)
09-02-2011 CARE MANAGEMENT NOTE:  I met with pt's daughter, [name] Choice [phone]), and son, Jacion Dismore [phone]), to discuss discuss discharge needs. Pt was living alone PTA but his daughter managed his medications, provided transportation and generally oversaw his needs. He has a rolling walker and cane. They understand that the previous CM sent referrals to [facility] and [facility]. Pt's daughter wants pt to go for rehab and intends to take him back to his home and stay with him. After a lengthy conversation where I discovered pt is a retired Cytogeneticist, I asked if they might be interested in my sending a referral to [employer] as this caters to veterans.  They were agreeable to this and I sent a referral thru [facility] and await their response. Sudy Adams, BSW, CM

## 2011-09-02 NOTE — Progress Notes (Signed)
Bedside and Verbal shift change report given to Sherryll Burger RN (oncoming nurse) by Blane Ohara (offgoing nurse).  Report given with SBAR, Kardex, MAR and Recent Results.

## 2011-09-02 NOTE — Other (Signed)
[facility] Progress Note    Recommendations/ Comments: Chart review for elevated BG's. Noted that Lantus increased to 30 units starting today.    Chart reviewed on Rande Lawman.    Patient is a 71 y.o. male with Type 2 DM  on Lantus 25 units and Aspart BID at home.    A1c:   Lab Results   Component Value Date/Time    Hemoglobin A1c 8.6 08/21/2011  4:30 PM       POC Glucose last 24hrs:   Lab Results   Component Value Date/Time    POC GLUCOSE 237 09/01/2011  9:00 PM    POC GLUCOSE 228 09/01/2011  4:32 PM    POC GLUCOSE 215 09/01/2011 12:14 PM    POC GLUCOSE 180 09/01/2011  7:39 AM    POC GLUCOSE 239 08/31/2011  9:18 PM    POC GLUCOSE 152 08/31/2011  4:27 PM    POC GLUCOSE 188 08/31/2011 11:36 AM    POC GLUCOSE 133 08/31/2011  7:21 AM         Lab Results   Component Value Date/Time    Creatinine 1.43 09/01/2011  9:17 AM       PO intake: Patient Vitals for the past 72 hrs:   % Diet Eaten   09/01/11 1300 90 %   09/01/11 1028 80 %   08/31/11 1301 80 %   08/31/11 1129 90 %         Current hospital DM medications: Lantus 30 units each and Humalog correction scale above 200.    Will continue to follow as needed.    Thank you.  Elenor Quinones RN, CDE

## 2011-09-02 NOTE — Discharge Summary (Signed)
Physician Discharge Summary     Patient ID:  Travis [name]  [id number]  71 y.o.  Feb 20, 1941    Admit date: 08/21/2011    Discharge date and time: 09/02/2011    Admission Diagnoses: Chest pain, unspecified  Chest pain, unspecified    Discharge Diagnoses/Hospital Course   1) Chest pain/NSTEMI: Asymptomatic now. Was seen by cardiology and is on BB, nitro, plavix, ranexa. Holding cardiac MRI and cath due to ARF. Daughter confirms that he has previously declined to have testing done. He can go to SNF and follow up with cardiology   2) ARF/CKD: Improved. ARF initially thought to be volume related, but not improving with IVF. Held diuretics. Being seen by renal and urology. Appreciate their input. On terazosin and finasteride. Will continue to hold ACE I and cardiology to follow-up in 1 week to determine when to resume.   3) Pneumonia: patchy ASDz seen on admission. This could be related to interstitial edema. Cx neg. S/p 5 days of levaquin   4) DM 2: BS variable. Eating better. Adjusted Lantus and continue SSI.   5) Anemia: likely chronic, related to CKD. Also had a hx of GI bleed from AVMs. s/p 1u RBCs. H/H stable.   6) PVD: hx of bypass in legs. No ischemia per Vascular.   7) Legs pain: likely combination of diabetic neuropathy and PVD. LE dopplers neg for DVT   8) AMS: Improving after holding oxycontin and neurontin. Head CT and ammonia wnl. Neuro reviewed and determined he has moderate dementia. Discussed with daughter. Aricept started.   PCP: Phys Other, MD     Consults: cardiology, nephrology and urology    Discharge Exam:  Please refer to progress note from today.    Disposition: SNF    Patient Instructions:   Current Discharge Medication List      START taking these medications    Details   amLODIPine (NORVASC) 5 mg tablet Take 1 Tab by mouth daily.  Qty: 30 Tab, Refills: 0      donepezil (ARICEPT) 5 mg tablet Take 1 Tab by mouth nightly.  Qty: 30 Tab, Refills: 0      famotidine (PEPCID) 20 mg tablet Take 1 Tab by  mouth daily.  Qty: 30 Tab, Refills: 0      finasteride (PROSCAR) 5 mg tablet Take 1 Tab by mouth daily.  Qty: 30 Tab, Refills: 0      ranolazine ER (RANEXA) 500 mg SR tablet Take 1 Tab by mouth two (2) times a day.  Qty: 60 Tab, Refills: 0         CONTINUE these medications which have CHANGED    Details   bumetanide (BUMEX) 2 mg tablet Take 0.5 Tabs by mouth daily.  Qty: 30 Tab, Refills: 0    Associated Diagnoses: CAD (coronary artery disease); Cardiac dysrhythmia, unspecified; CHF (congestive heart failure); Hyperlipidemia; HTN (hypertension); Chronic kidney disease, stage III (moderate)         CONTINUE these medications which have NOT CHANGED    Details   albuterol (PROVENTIL HFA, VENTOLIN HFA) 90 mcg/actuation inhaler Take 2 Puffs by inhalation every six (6) hours as needed for Wheezing.  Qty: 1 Inhaler, Refills: 1    Associated Diagnoses: CHF (congestive heart failure); CAD (coronary artery disease); Hyperlipidemia; HTN (hypertension); Chronic kidney disease, stage III (moderate); Preop cardiovascular exam      oxyCODONE-acetaminophen (PERCOCET 7.5) 7.5-325 mg per tablet Take 1 Tab by mouth every four (4) hours as needed for Pain.  Qty:  40 Tab, Refills: 0      fish oil-omega-3 fatty acids 340-1,000 mg capsule Take 1 Cap by mouth two (2) times a day.  Qty: 60 Cap, Refills: 0      insulin glargine (LANTUS) 100 unit/mL injection Please take 25 units of your glargine daily  Qty: 1 Vial, Refills: 0      metoprolol (LOPRESSOR) 100 mg tablet Take 1 Tab by mouth two (2) times a day.  Qty: 60 Tab, Refills: 3      atorvastatin (LIPITOR) 40 mg tablet Take 1 Tab by mouth nightly.  Qty: 30 Tab, Refills: 3      isosorbide mononitrate ER (IMDUR) 30 mg tablet Take 1 Tab by mouth daily.  Qty: 30 Tab, Refills: 3      Ferrous Sulfate (SLOW FE) 47.5 mg iron TbER tablet Take 1 Tab by mouth two (2) times a day.      clopidogrel (PLAVIX) 75 mg tablet Take 1 Tab by mouth daily.  Qty: 30 Tab, Refills: 6    Associated Diagnoses: NSTEMI  (non-ST elevated myocardial infarction); CAD (coronary artery disease)      potassium chloride (K-DUR, KLOR-CON) 10 mEq tablet Take 1 Tab by mouth daily.  Qty: 30 Tab, Refills: 6    Comments: PT MAY HAVE WHATEVER FORMULATION HE NORMALLY HAS  Associated Diagnoses: CAD (coronary artery disease); CHF (congestive heart failure); HTN (hypertension); Chronic kidney disease, stage III (moderate); Lightheadedness; Weakness; Anemia, unspecified      insulin aspart (NOVOLOG) 100 unit/mL injection by SubCUTAneous route Before breakfast and dinner.      Associated Diagnoses: CHF (congestive heart failure); CAD (coronary artery disease); Hyperlipidemia; HTN (hypertension); Chronic kidney disease, stage III (moderate); PVD (peripheral vascular disease); Type II or unspecified type diabetes mellitus with renal manifestations, not stated as uncontrolled      misoprostol (CYTOTEC) 200 mcg tablet Take 1 Tab by mouth two (2) times a day.  Qty: 60 Tab, Refills: 3    Associated Diagnoses: Peptic ulcer, unspecified site, unspecified as acute or chronic, without mention of hemorrhage, perforation, or obstruction      terazosin (HYTRIN) 5 mg capsule Take 10 mg by mouth nightly.    Associated Diagnoses: Edema      aspirin 81 mg tablet Take  by mouth daily.      levothyroxine (SYNTHROID) 150 mcg tablet Take  by mouth daily (before breakfast).      nitroglycerin (NITROSTAT) 0.4 mg SL tablet by SubLINGual route every five (5) minutes as needed.         STOP taking these medications       omeprazole (PRILOSEC) 20 mg capsule Comments:   Reason for Stopping:         lisinopril (PRINIVIL, ZESTRIL) 20 mg tablet Comments:   Reason for Stopping:         ranitidine hcl (ZANTAC) 150 mg capsule Comments:   Reason for Stopping:             Activity: activity as tolerated  Diet: Cardiac Diet  Wound Care: None needed    Follow-up with Phys Other, MD in 1 week with Dr. Welton Flakes  Follow-up tests/labs BMP    Approximate time spent in patient care on day of  discharge: 37 minutes     Signed:  Gaspar Bidding, MD  09/02/2011  11:52 AM

## 2011-09-03 LAB — CBC WITH AUTOMATED DIFF
ABS. BASOPHILS: 0 10*3/uL (ref 0.0–0.1)
ABS. EOSINOPHILS: 0.1 10*3/uL (ref 0.0–0.4)
ABS. LYMPHOCYTES: 1.1 10*3/uL (ref 0.8–3.5)
ABS. MONOCYTES: 0.6 10*3/uL (ref 0.0–1.0)
ABS. NEUTROPHILS: 5.3 10*3/uL (ref 1.8–8.0)
BASOPHILS: 0 % (ref 0–1)
EOSINOPHILS: 1 % (ref 0–7)
HCT: 26.2 % — ABNORMAL LOW (ref 36.6–50.3)
HGB: 8.5 g/dL — ABNORMAL LOW (ref 12.1–17.0)
LYMPHOCYTES: 16 % (ref 12–49)
MCH: 27.7 PG (ref 26.0–34.0)
MCHC: 32.4 g/dL (ref 30.0–36.5)
MCV: 85.3 FL (ref 80.0–99.0)
MONOCYTES: 8 % (ref 5–13)
NEUTROPHILS: 75 % (ref 32–75)
PLATELET: 265 10*3/uL (ref 150–400)
RBC: 3.07 M/uL — ABNORMAL LOW (ref 4.10–5.70)
RDW: 17.9 % — ABNORMAL HIGH (ref 11.5–14.5)
WBC: 7.1 10*3/uL (ref 4.1–11.1)

## 2011-09-03 LAB — METABOLIC PANEL, BASIC
Anion gap: 9 mmol/L (ref 5–15)
BUN/Creatinine ratio: 12 (ref 12–20)
BUN: 16 MG/DL (ref 6–20)
CO2: 28 MMOL/L (ref 21–32)
Calcium: 8.3 MG/DL — ABNORMAL LOW (ref 8.5–10.1)
Chloride: 103 MMOL/L (ref 97–108)
Creatinine: 1.37 MG/DL — ABNORMAL HIGH (ref 0.45–1.15)
GFR est AA: >60 mL/min/{1.73_m2} (ref >60)
GFR est non-AA: 51 mL/min/{1.73_m2} — ABNORMAL LOW (ref >60)
Glucose: 68 MG/DL (ref 65–100)
Potassium: 3.8 MMOL/L (ref 3.5–5.1)
Sodium: 140 MMOL/L (ref 136–145)

## 2011-09-03 LAB — GLUCOSE, POC
Glucose (POC): 99 mg/dL (ref 75–110)
Glucose (POC): 102 mg/dL (ref 75–110)
Glucose (POC): 139 mg/dL — ABNORMAL HIGH (ref 75–110)

## 2011-09-03 LAB — MAGNESIUM: Magnesium: 1.5 MG/DL — ABNORMAL LOW (ref 1.6–2.4)

## 2011-09-03 MED ORDER — AMMONIUM LACTATE 12 % LOTION
12 % | CUTANEOUS | Status: AC
Start: 2011-09-03 — End: ?

## 2011-09-03 MED ORDER — DOCUSATE SODIUM 100 MG CAP
100 mg | Freq: Every day | ORAL | Status: DC
Start: 2011-09-03 — End: 2011-09-03
  Administered 2011-09-03: 16:00:00 via ORAL

## 2011-09-03 MED ORDER — MAGNESIUM SULFATE IN D5W 1 GRAM/100 ML IV PIGGY BACK
1 gram/00 mL | Freq: Once | INTRAVENOUS | Status: AC
Start: 2011-09-03 — End: 2011-09-03
  Administered 2011-09-03: 14:00:00 via INTRAVENOUS

## 2011-09-03 MED ORDER — POTASSIUM CHLORIDE SR 10 MEQ TAB
10 mEq | ORAL | Status: AC
Start: 2011-09-03 — End: 2011-09-03
  Administered 2011-09-03: 13:00:00 via ORAL

## 2011-09-03 MED FILL — ISOSORBIDE MONONITRATE SR 30 MG 24 HR TAB: 30 mg | ORAL | Qty: 2

## 2011-09-03 MED FILL — INSULIN GLARGINE 100 UNIT/ML INJECTION: 100 unit/mL | SUBCUTANEOUS | Qty: 0.3

## 2011-09-03 MED FILL — RANEXA 500 MG TABLET,EXTENDED RELEASE: 500 mg | ORAL | Qty: 1

## 2011-09-03 MED FILL — FERROUS SULFATE 325 MG (65 MG ELEMENTAL IRON) TAB: 325 mg (65 mg iron) | ORAL | Qty: 1

## 2011-09-03 MED FILL — FINASTERIDE 5 MG TAB: 5 mg | ORAL | Qty: 1

## 2011-09-03 MED FILL — MAGNESIUM SULFATE IN D5W 1 GRAM/100 ML IV PIGGY BACK: 1 gram/00 mL | INTRAVENOUS | Qty: 100

## 2011-09-03 MED FILL — MISOPROSTOL 200 MCG TAB: 200 mcg | ORAL | Qty: 1

## 2011-09-03 MED FILL — FISH OIL 340 MG-1,000 MG CAPSULE: 340-1000 mg | ORAL | Qty: 1

## 2011-09-03 MED FILL — ARICEPT 5 MG TABLET: 5 mg | ORAL | Qty: 1

## 2011-09-03 MED FILL — AMLODIPINE 5 MG TAB: 5 mg | ORAL | Qty: 1

## 2011-09-03 MED FILL — BUMETANIDE 1 MG TAB: 1 mg | ORAL | Qty: 1

## 2011-09-03 MED FILL — FAMOTIDINE 20 MG TAB: 20 mg | ORAL | Qty: 1

## 2011-09-03 MED FILL — MORPHINE 2 MG/ML INJECTION: 2 mg/mL | INTRAMUSCULAR | Qty: 1

## 2011-09-03 MED FILL — METOPROLOL TARTRATE 50 MG TAB: 50 mg | ORAL | Qty: 2

## 2011-09-03 MED FILL — PLAVIX 75 MG TABLET: 75 mg | ORAL | Qty: 1

## 2011-09-03 MED FILL — HEPARIN (PORCINE) 5,000 UNIT/ML IJ SOLN: 5000 unit/mL | INTRAMUSCULAR | Qty: 1

## 2011-09-03 MED FILL — K-TAB 10 MEQ TABLET,EXTENDED RELEASE: 10 mEq | ORAL | Qty: 1

## 2011-09-03 MED FILL — TERAZOSIN 5 MG CAP: 5 mg | ORAL | Qty: 2

## 2011-09-03 MED FILL — ASPIRIN 81 MG CHEWABLE TAB: 81 mg | ORAL | Qty: 1

## 2011-09-03 MED FILL — PANTOPRAZOLE 40 MG TAB, DELAYED RELEASE: 40 mg | ORAL | Qty: 1

## 2011-09-03 MED FILL — LIPITOR 20 MG TABLET: 20 mg | ORAL | Qty: 2

## 2011-09-03 MED FILL — LEVOTHYROXINE 150 MCG TAB: 150 mcg | ORAL | Qty: 1

## 2011-09-03 MED FILL — DOK 100 MG CAPSULE: 100 mg | ORAL | Qty: 1

## 2011-09-03 MED FILL — ACETAMINOPHEN 325 MG TABLET: 325 mg | ORAL | Qty: 2

## 2011-09-03 NOTE — Progress Notes (Signed)
[facility] Progress Note  [facility]   [address] [place], [place] [address]   [phone]      Assessment & Plan:   ARF/CKD  ?? Continues to improve    ?? Underlying problem is cardiorenal syndrome  ?? BOO: s/p  Foley: removal:Urology following, on Hytrin and Proscar    AMS  ?? F/by neurology  ?? better    HTN  ?? Labile:elevated  ?? On CCB,bumex,imdur,BB,terazosin  ?? Add ACE if  cr stable out pt :may be after cardiac MRI    CP/CAD  ?? Medical mgmt for now  ?? Cardiac MRI later:dw him about BJY:NWGNF ideally wt for better GFR to minimize the risk    Anemia  ?? EPO + PO Fe  ?? Repeat Iron panel out pt  ?? If low:IV Iron       Subjective:   CC: f/u ARF, CKD  HPI: Cr   trending down    Going home  ROS: NO CP/SOB/N/V/ABD PAIN  Current Facility-Administered Medications   Medication Dose Route Frequency   ??? magnesium sulfate 1 g/100 ml IVPB (premix or compounded)  1 g IntraVENous ONCE   ??? potassium chloride SR (KLOR-CON 10) tablet 10 mEq  10 mEq Oral NOW   ??? docusate sodium (COLACE) capsule 100 mg  100 mg Oral DAILY   ??? amLODIPine (NORVASC) tablet 5 mg  5 mg Oral DAILY   ??? isosorbide mononitrate ER (IMDUR) tablet 60 mg  60 mg Oral DAILY   ??? bumetanide (BUMEX) tablet 1 mg  1 mg Oral DAILY   ??? ammonium lactate (LAC-HYDRIN) 12 % lotion   Topical BID   ??? insulin glargine (LANTUS) injection 30 Units  30 Units SubCUTAneous DAILY   ??? finasteride (PROSCAR) tablet 5 mg  5 mg Oral DAILY   ??? morphine injection 2 mg  2 mg IntraVENous Q4H PRN   ??? donepezil (ARICEPT) tablet 5 mg  5 mg Oral QHS   ??? insulin lispro (HUMALOG) injection   SubCUTAneous TIDAC   ??? ranolazine ER (RANEXA) tablet 500 mg  500 mg Oral BID   ??? epoetin alfa (EPOGEN;PROCRIT) injection 20,000 Units  20,000 Units SubCUTAneous Q7D   ??? dextrose (D50W) injection Syrg 12.5-25 g  12.5-25 g IntraVENous PRN   ??? famotidine (PEPCID) tablet 20 mg  20 mg Oral DAILY   ??? aspirin chewable tablet 81 mg  81 mg Oral DAILY   ???  atorvastatin (LIPITOR) tablet 40 mg  40 mg Oral QHS   ??? clopidogrel (PLAVIX) tablet 75 mg  75 mg Oral DAILY   ??? ferrous sulfate tablet 325 mg  325 mg Oral ACB&D   ??? fish oil-omega-3 fatty acids 340-1,000 mg capsule 1 Cap  1 Cap Oral BID   ??? levothyroxine (SYNTHROID) tablet 150 mcg  150 mcg Oral ACB   ??? metoprolol (LOPRESSOR) tablet 100 mg  100 mg Oral BID   ??? misoprostol (CYTOTEC) tablet 200 mcg  200 mcg Oral BID   ??? nitroglycerin (NITROSTAT) tablet 0.4 mg  0.4 mg SubLINGual PRN   ??? terazosin (HYTRIN) capsule 10 mg  10 mg Oral QHS   ??? acetaminophen (TYLENOL) tablet 650 mg  650 mg Oral Q4H PRN   ??? ondansetron (ZOFRAN) injection 4 mg  4 mg IntraVENous Q4H PRN   ??? heparin (porcine) injection 5,000 Units  5,000 Units SubCUTAneous Q8H   ??? pantoprazole (PROTONIX) tablet 40 mg  40 mg Oral ACB   ??? levalbuterol (  XOPENEX) nebulizer soln 0.63 mg/3 ml  0.63 mg Nebulization Q4H PRN   ??? glucose chewable tablet 16 g  4 Tab Oral PRN   ??? glucagon (GLUCAGEN) injection 1 mg  1 mg IntraMUSCular PRN          Objective:     Vitals:  Blood pressure 174/76, pulse 84, temperature 98.7 ??F (37.1 ??C), resp. rate 18, height 5\' 6"  (1.676 m), weight 89.903 kg (198 lb 3.2 oz), SpO2 92.00%.  Temp (24hrs), Avg:98.5 ??F (36.9 ??C), Min:98.1 ??F (36.7 ??C), Max:98.7 ??F (37.1 ??C)      Intake and Output:  04/09 0700 - 04/09 1859  In: -   Out: 125 [Urine:125]  04/07 1900 - 04/09 0659  In: 840 [P.O.:840]  Out: 900 [Urine:900]    Physical Exam:               GENERAL ASSESSMENT: NAD, alert  CHEST: CTA  HEART: S1S2  ABDOMEN: Soft,NT, +BS  EXTREMITY: 1 EDEMA            Data Review      No results found for this basename: ITNL in the last 72 hours   No results found for this basename: CPK:3,CKMB:3,TROIQ:3, in the last 72 hours  Recent Labs   Basename 09/03/11 0340 09/01/11 0917 08/31/11 1156    NA 140 137 138    K 3.8 4.6 4.7    CL 103 102 102    CO2 28 27 27     BUN 16 22* 28*    CREA 1.37* 1.43* 1.55*    GLU 68 207* 171*    PHOS -- -- --    MG 1.5* -- --    CA  8.3* 7.9* 8.0*    ALB -- -- --    WBC 7.1 -- --    HGB 8.5* -- --    HCT 26.2* -- --    PLT 265 -- --      No results found for this basename: INR:3,PTP:3,APTT:3, in the last 72 hours  Needs: urine analysis, urine sodium, protein and creatinine  Lab Results   Component Value Date/Time    Sodium,urine random 18 08/27/2011  6:45 PM    Creatinine,urine random 180.01 08/27/2011  6:45 PM                 [facility]  [address]  [place], [place] [address]  Phone - [phone]  Fax - [phone]

## 2011-09-03 NOTE — Progress Notes (Signed)
Bedside and Verbal shift change report given to Mary Kirchner RN (oncoming nurse) by Tamara RN (offgoing nurse).  Report given with SBAR.

## 2011-09-03 NOTE — Progress Notes (Signed)
TRANSFER - OUT REPORT:    Verbal report given to [facility](name) on Travis [name]  being transferred to SNF(unit) for routine progression of care       Report consisted of patient???s Situation, Background, Assessment and   Recommendations(SBAR).     Information from the following report(s) SBAR was reviewed with the receiving nurse.    Opportunity for questions and clarification was provided.

## 2011-09-03 NOTE — Progress Notes (Signed)
09-03-2011 CARE MANAGEMENT NOTE:  Pt has been accepted by [facility] and will be transported there today by his daughter. I am sending pt's prescriptions, discharge instructions, wound care order, wound care RN note dated 09-02-2011, medical progress note dated 09-03-2011, nephrology note dated 09-03-2011, and today's MAR to the NH via [facility] and these will also accompany the pt. The pt is aware of the arrangements and the RN has been instructed to call report. Sudy Adams, BSW, CM

## 2011-09-03 NOTE — Discharge Summary (Signed)
Physician Discharge Summary    Patient ID:   Travis [name]   [id number]   71 y.o.   04/22/1941     Admit date: 08/21/2011   Discharge date and time: 09/03/2011   Admission Diagnoses: Chest pain, unspecified   Chest pain, unspecified     Discharge Diagnoses/Hospital Course   1) Chest pain/NSTEMI: Asymptomatic now. Was seen by cardiology and is on BB, nitro, plavix, ranexa. Holding cardiac MRI and cath due to ARF. Daughter confirms that he has previously declined to have testing done. He can go to SNF and follow up with cardiology   2) ARF/CKD: Improved. ARF initially thought to be volume related, but not improving with IVF. Held diuretics. Being seen by renal and urology. Appreciate their input. On terazosin and finasteride. Will continue to hold ACE I and cardiology to follow-up in 1 week to determine when to resume.   3) Pneumonia: patchy ASDz seen on admission. This could be related to interstitial edema. Cx neg. S/p 5 days of levaquin   4) DM 2: BS variable. Eating better. Adjusted Lantus and continue SSI.   5) Anemia: likely chronic, related to CKD. Also had a hx of GI bleed from AVMs. s/p 1u RBCs. H/H stable.   6) PVD: hx of bypass in legs. No ischemia per Vascular.   7) Legs pain: likely combination of diabetic neuropathy and PVD. LE dopplers neg for DVT   8) AMS: Improving after holding oxycontin and neurontin. Head CT and ammonia wnl. Neuro reviewed and determined he has moderate dementia. Discussed with daughter. Aricept started.     PCP: Phys Other, MD     Consults: cardiology, nephrology and urology     Discharge Exam:   Please refer to progress note from today.     Disposition: SNF     Patient Instructions:  Discharge Medication List as of 09/03/2011  2:38 PM      START taking these medications    Details   ammonium lactate (LAC-HYDRIN) 12 % lotion Please apply to affected area two times daily (L lower leg)No Print, Disp-1 Bottle, R-0      amLODIPine (NORVASC) 5 mg tablet Take 1 Tab by mouth daily.Print, 5  mg, Disp-30 Tab, R-0      donepezil (ARICEPT) 5 mg tablet Take 1 Tab by mouth nightly.Normal, 5 mg, Disp-30 Tab, R-0      famotidine (PEPCID) 20 mg tablet Take 1 Tab by mouth daily.Normal, 20 mg, Disp-30 Tab, R-0      finasteride (PROSCAR) 5 mg tablet Take 1 Tab by mouth daily.Print, 5 mg, Disp-30 Tab, R-0      ranolazine ER (RANEXA) 500 mg SR tablet Take 1 Tab by mouth two (2) times a day.Print, 500 mg, Disp-60 Tab, R-0      oxyCODONE-acetaminophen (PERCOCET) 5-325 mg per tablet Take 1 Tab by mouth every four (4) hours as needed for Pain.Print, 1 Tab, Disp-30 Tab, R-0         CONTINUE these medications which have CHANGED    Details   bumetanide (BUMEX) 2 mg tablet Take 0.5 Tabs by mouth daily.Print, 1 mg, Disp-30 Tab, R-0      insulin glargine (LANTUS) 100 unit/mL injection 30 Units by SubCUTAneous route daily.No Print, 30 Units, Disp-1 Vial, R-0         CONTINUE these medications which have NOT CHANGED    Details   albuterol (PROVENTIL HFA, VENTOLIN HFA) 90 mcg/actuation inhaler Take 2 Puffs by inhalation every six (6) hours as  needed for Wheezing.Print, 2 Puff, Disp-1 Inhaler, R-1      fish oil-omega-3 fatty acids 340-1,000 mg capsule Take 1 Cap by mouth two (2) times a day.Print, 1 Cap, Disp-60 Cap, R-0      metoprolol (LOPRESSOR) 100 mg tablet Take 1 Tab by mouth two (2) times a day.Normal, 100 mg, Disp-60 Tab, R-3      atorvastatin (LIPITOR) 40 mg tablet Take 1 Tab by mouth nightly.Normal, 40 mg, Disp-30 Tab, R-3      isosorbide mononitrate ER (IMDUR) 30 mg tablet Take 1 Tab by mouth daily.Normal, 30 mg, Disp-30 Tab, R-3      Ferrous Sulfate (SLOW FE) 47.5 mg iron TbER tablet Take 1 Tab by mouth two (2) times a day.Historical Med, 1 Tab      clopidogrel (PLAVIX) 75 mg tablet Take 1 Tab by mouth daily.Normal, 75 mg, Disp-30 Tab, R-6      potassium chloride (K-DUR, KLOR-CON) 10 mEq tablet Take 1 Tab by mouth daily.Normal, 10 mEq, Disp-30 Tab, R-6PT MAY HAVE WHATEVER FORMULATION HE NORMALLY HAS      insulin  aspart (NOVOLOG) 100 unit/mL injection by SubCUTAneous route Before breakfast and dinner.  Historical Med      misoprostol (CYTOTEC) 200 mcg tablet Take 1 Tab by mouth two (2) times a day.Normal, 200 mcg, Disp-60 Tab, R-3      terazosin (HYTRIN) 5 mg capsule 10 mg, Oral, EVERY BEDTIME, Until Discontinued, Historical Med      aspirin 81 mg tablet Oral, DAILY, Until Discontinued, Historical Med      levothyroxine (SYNTHROID) 150 mcg tablet Oral, DAILY BEFORE BREAKFAST, Until Discontinued, Historical Med      nitroglycerin (NITROSTAT) 0.4 mg SL tablet SubLINGual, EVERY 5 MIN AS NEEDED, Until Discontinued, Historical Med         STOP taking these medications       oxyCODONE-acetaminophen (PERCOCET 7.5) 7.5-325 mg per tablet Comments:   Reason for Stopping:         omeprazole (PRILOSEC) 20 mg capsule Comments:   Reason for Stopping:         lisinopril (PRINIVIL, ZESTRIL) 20 mg tablet Comments: acute kidney failure; Dr. Welton Flakes to determine when to resume   Reason for Stopping:         ranitidine hcl (ZANTAC) 150 mg capsule Comments:   Reason for Stopping:                 Activity: activity as tolerated   Diet: Cardiac Diet   Wound Care: None needed   Follow-up with Phys Other, MD in 1 week with Dr. Welton Flakes   Follow-up tests/labs BMP   Approximate time spent in patient care on day of discharge: 37 minutes

## 2011-09-03 NOTE — Progress Notes (Signed)
Dressing to left foot changed, foot and inbetween toes cleansed and dry gauze between toes placed, acticoat over amputation site and whole thing wrapped in dry kling

## 2011-09-03 NOTE — Progress Notes (Signed)
Travis [name] [facility]  [address] [place], [place] [address]  [phone]      Medical Progress Note      NAME:         Travis [name]   DOB:        12-24-1940  MRM:        [id number]    Date/Time:  09/03/2011  12:58 PM       Subjective:     Chief Complaint:   [facility] am good[facility]"    Pt excited for discharge this AM. Cardiac MRI will be performed as an outpatient; this was discussed with family by Dr. Welton Flakes. No CP/SOB this AM. Evaluated by wound care and wound debrided; appreciate rec's     Objective:     Vitals:     Last 24hrs VS reviewed since prior progress note. Most recent are:    BP 133/62   Pulse 69   Temp 98.2 ??F (36.8 ??C)   Resp 16   Ht 5\' 6"  (1.676 m)   Wt 198 lb 3.2 oz   BMI 31.99 kg/m2   SpO2 95%       Intake/Output Summary (Last 24 hours) at 09/03/11 1136  Last data filed at 09/03/11 1131   Gross per 24 hour   Intake    840 ml   Output   1050 ml   Net   -210 ml        Exam:     Physical Exam:    Gen:  Well-developed but weak and in no acute distress  Eyes:  Pale conjunctivae, PERRLA, no discharge.  ENT:  hearing intact to voice, no ottorrhea or rhinorrhea, dry mucous membranes  Neck:  Supple with no masses, thyroid non-tender and trachea central.  Pulm:  No accessory muscle use, decreased bur clear breath sounds without wheezes rales or rhonchi  Card:  No JVD or murmurs, has regular and normal S1, S2 without thrills, bruits or peripheral edema  Abd:  Soft, non-tender, non-distended, normoactive bowel sounds are present, no palpable organomegaly.   Musc:  No cyanosis or clubbing. No atrophy or deformities.  Skin:  old rashes both lowe extremities, bruising or ulcers.  Neuro: Awake and alert, CNs 2-12 intact, non focal exam. Follows commands appropriately.  Psych:  Has little insight. Oriented x 2, no hallucinations or delusions.    Medications:     Current Facility-Administered Medications   Medication Dose Route Frequency   ??? magnesium sulfate 1 g/100 ml IVPB (premix or  compounded)  1 g IntraVENous ONCE   ??? potassium chloride SR (KLOR-CON 10) tablet 10 mEq  10 mEq Oral NOW   ??? docusate sodium (COLACE) capsule 100 mg  100 mg Oral DAILY   ??? amLODIPine (NORVASC) tablet 5 mg  5 mg Oral DAILY   ??? isosorbide mononitrate ER (IMDUR) tablet 60 mg  60 mg Oral DAILY   ??? bumetanide (BUMEX) tablet 1 mg  1 mg Oral DAILY   ??? ammonium lactate (LAC-HYDRIN) 12 % lotion   Topical BID   ??? insulin glargine (LANTUS) injection 30 Units  30 Units SubCUTAneous DAILY   ??? finasteride (PROSCAR) tablet 5 mg  5 mg Oral DAILY   ??? morphine injection 2 mg  2 mg IntraVENous Q4H PRN   ??? donepezil (ARICEPT) tablet 5 mg  5 mg Oral QHS   ??? insulin lispro (HUMALOG) injection   SubCUTAneous TIDAC   ??? ranolazine ER (  RANEXA) tablet 500 mg  500 mg Oral BID   ??? epoetin alfa (EPOGEN;PROCRIT) injection 20,000 Units  20,000 Units SubCUTAneous Q7D   ??? dextrose (D50W) injection Syrg 12.5-25 g  12.5-25 g IntraVENous PRN   ??? famotidine (PEPCID) tablet 20 mg  20 mg Oral DAILY   ??? aspirin chewable tablet 81 mg  81 mg Oral DAILY   ??? atorvastatin (LIPITOR) tablet 40 mg  40 mg Oral QHS   ??? clopidogrel (PLAVIX) tablet 75 mg  75 mg Oral DAILY   ??? ferrous sulfate tablet 325 mg  325 mg Oral ACB&D   ??? fish oil-omega-3 fatty acids 340-1,000 mg capsule 1 Cap  1 Cap Oral BID   ??? levothyroxine (SYNTHROID) tablet 150 mcg  150 mcg Oral ACB   ??? metoprolol (LOPRESSOR) tablet 100 mg  100 mg Oral BID   ??? misoprostol (CYTOTEC) tablet 200 mcg  200 mcg Oral BID   ??? nitroglycerin (NITROSTAT) tablet 0.4 mg  0.4 mg SubLINGual PRN   ??? terazosin (HYTRIN) capsule 10 mg  10 mg Oral QHS   ??? acetaminophen (TYLENOL) tablet 650 mg  650 mg Oral Q4H PRN   ??? ondansetron (ZOFRAN) injection 4 mg  4 mg IntraVENous Q4H PRN   ??? heparin (porcine) injection 5,000 Units  5,000 Units SubCUTAneous Q8H   ??? pantoprazole (PROTONIX) tablet 40 mg  40 mg Oral ACB   ??? levalbuterol (XOPENEX) nebulizer soln 0.63 mg/3 ml  0.63 mg Nebulization Q4H PRN   ??? glucose chewable tablet 16 g  4  Tab Oral PRN   ??? glucagon (GLUCAGEN) injection 1 mg  1 mg IntraMUSCular PRN        Lab data Review:     Recent Labs   Travis [name] 09/03/11 0340    WBC 7.1    HGB 8.5*    HCT 26.2*    PLT 265     Recent Labs   Basename 09/03/11 0340 09/01/11 0917 08/31/11 1156    NA 140 137 138    K 3.8 4.6 4.7    CL 103 102 102    CO2 28 27 27     GLU 68 207* 171*    BUN 16 22* 28*    CREA 1.37* 1.43* 1.55*    CA 8.3* 7.9* 8.0*    MG 1.5* -- --    PHOS -- -- --    ALB -- -- --    TBIL -- -- --    SGOT -- -- --    INR -- -- --     Lab Results   Component Value Date/Time    POC GLUCOSE 102 09/03/2011  7:49 AM    POC GLUCOSE 99 09/02/2011  8:26 PM    POC GLUCOSE 167 09/02/2011  4:36 PM    POC GLUCOSE 191 09/02/2011 11:48 AM    POC GLUCOSE 232 09/02/2011  7:18 AM       Assessment / Plan:   Mr. Travis [name] is a 71 y.o.  African American with a hx of CAD, syst CHF EF 40%, DM, HTN, PVD, CKD, presented w/ NSTEMI, ARF     1) Chest pain/NSTEMI: Asymptomatic now. Cards following. Cont BB, nitro, plavix, ranexa. Holding cardiac MRI; can be done as outpatient  -cardiac MRI as an outpatient   -appreciate cardiology rec's; pt currently chest pain free   -to continue plavix, ASA, ranolazine, nitrate, beta blockade    2) ARF/CKD: Continue to follow renal panel. ARF initially thought to be volume related, but  not improving with IVF. Holding diuretics. Discussed with renal. Seen by urology. Appreciate their input. On terazosin and finasteride. ?cardiorenal   -at some point in the future will need to resume ACE I; pt with f/u in one week with cardiology; to address at that time   -resume Bumex at reduced dose (1mg  daily)    -strict I's and O's; daily weights     3) Pneumonia: patchy ASDz seen on admission. This could be related to interstitial edema. Cx neg. S/p 5 days of levaquin     4) DM 2: BS high. Eating better. Adjusted Lantus. Has had hx of hypoglycemia due to poor PO intake. Cont SSI     5) Anemia: likely chronic, related to CKD. Also had a hx of GI bleed from  AVMs. Now s/p 1u RBCs (given ischemia). Monitor closely especially in the setting of multiple blood thinners    6) PVD: hx of bypass in legs. No ischemia per Vascular. Appreciate wound cares assistance.     7) Legs pain: likely combination of diabetic neuropathy and PVD. LE dopplers neg for DVT     8) AMS: Improving after holding oxycontin and neurontin. Head CT and ammonia wnl. Neuro reviewed and determined he has moderate dementia. Aricept started. To SNF                   Care Plan discussed with: Patient    Time Spent: 35 minutes     Discussed:  Care Plan    Prophylaxis:  Hep SQ    Disposition:  Home w/Family           ___________________________________________________    Attending Physician: Gaspar Bidding, MD

## 2011-10-01 ENCOUNTER — Inpatient Hospital Stay
Admit: 2011-10-01 | Discharge: 2011-10-09 | Disposition: A | Discharge status: Skilled Nursing Facility | Payer: MEDICARE | Attending: Internal Medicine | Admitting: Internal Medicine

## 2011-10-01 DIAGNOSIS — IMO0002 Reserved for concepts with insufficient information to code with codable children: Secondary | ICD-10-CM

## 2011-10-01 LAB — POC CHEM8
Anion gap (POC): 17 mmol/L — ABNORMAL HIGH (ref 5–15)
BUN (POC): 34 MG/DL — ABNORMAL HIGH (ref 9–20)
CO2 (POC): 23 MMOL/L (ref 21–32)
Calcium, ionized (POC): 1.03 MMOL/L — ABNORMAL LOW (ref 1.12–1.32)
Chloride (POC): 99 MMOL/L (ref 98–107)
Creatinine (POC): 2.3 MG/DL — ABNORMAL HIGH (ref 0.6–1.3)
GFRAA, POC: 34 mL/min/{1.73_m2} — ABNORMAL LOW (ref >60)
GFRNA, POC: 28 mL/min/{1.73_m2} — ABNORMAL LOW (ref >60)
Glucose (POC): 419 MG/DL — ABNORMAL HIGH (ref 75–110)
Hematocrit (POC): 24 % — ABNORMAL LOW (ref 36.6–50.3)
Hemoglobin (POC): 8.2 GM/DL — ABNORMAL LOW (ref 12.1–17.0)
Potassium (POC): 5.2 MMOL/L — ABNORMAL HIGH (ref 3.5–5.1)
Sodium (POC): 134 MMOL/L — ABNORMAL LOW (ref 136–145)

## 2011-10-01 LAB — CBC WITH AUTOMATED DIFF
ABS. BASOPHILS: 0 10*3/uL (ref 0.0–0.1)
ABS. EOSINOPHILS: 0.1 10*3/uL (ref 0.0–0.4)
ABS. LYMPHOCYTES: 1.1 10*3/uL (ref 0.8–3.5)
ABS. MONOCYTES: 0.6 10*3/uL (ref 0.0–1.0)
ABS. NEUTROPHILS: 4.6 10*3/uL (ref 1.8–8.0)
BASOPHILS: 0 % (ref 0–1)
EOSINOPHILS: 1 % (ref 0–7)
HCT: 23.5 % — ABNORMAL LOW (ref 36.6–50.3)
HGB: 7.8 g/dL — ABNORMAL LOW (ref 12.1–17.0)
LYMPHOCYTES: 18 % (ref 12–49)
MCH: 28.1 PG (ref 26.0–34.0)
MCHC: 33.2 g/dL (ref 30.0–36.5)
MCV: 84.5 FL (ref 80.0–99.0)
MONOCYTES: 9 % (ref 5–13)
NEUTROPHILS: 72 % (ref 32–75)
PLATELET: 184 10*3/uL (ref 150–400)
RBC: 2.78 M/uL — ABNORMAL LOW (ref 4.10–5.70)
RDW: 17.7 % — ABNORMAL HIGH (ref 11.5–14.5)
WBC: 6.4 10*3/uL (ref 4.1–11.1)

## 2011-10-01 MED ORDER — CEFEPIME 2 GRAM SOLUTION FOR INJECTION
2 gram | Freq: Two times a day (BID) | INTRAMUSCULAR | Status: DC
Start: 2011-10-01 — End: 2011-10-01
  Administered 2011-10-02: 01:00:00 via INTRAVENOUS

## 2011-10-01 MED ORDER — SODIUM CHLORIDE 0.9 % IV
INTRAVENOUS | Status: DC
Start: 2011-10-01 — End: 2011-10-01
  Administered 2011-10-02: 01:00:00 via INTRAVENOUS

## 2011-10-01 MED ORDER — SODIUM CHLORIDE 0.9 % IV PIGGY BACK
1000 mg | INTRAVENOUS | Status: DC
Start: 2011-10-01 — End: 2011-10-01

## 2011-10-01 MED FILL — SODIUM CHLORIDE 0.9 % IV: INTRAVENOUS | Qty: 1000

## 2011-10-01 NOTE — ED Notes (Signed)
Pt. Arrives from outpatient wound care clinic "to be admitted because the foot infection has gone down to the bone so we need IV antibiotics."

## 2011-10-01 NOTE — ED Notes (Signed)
Pt has pacemaker and cannot have MRI.  Will contact Ortho PA.

## 2011-10-01 NOTE — Progress Notes (Signed)
[facility] Dosing Services: Antimicrobial Stewardship Progress Note  Consult for antibiotic dosing of Vancomycin/Zosyn by Dr. Eulah Pont  Pharmacist reviewed antibiotic appropriateness for 71 year old male for indication of left foot infection/possible osteomyelitis  Day of Therapy 1    Plan:  Vancomycin therapy:  Loading dose: Vancomycin 2000 mg IV x1 dose now  Maintenance dose: Vancomycin dosing per levels  Dose calculated to approximate a therapeutic trough of 15-20 mcg/mL.    Last trough level / Plan for level: tomorrow evening or thur am  Pharmacy to follow daily and will make changes to dose and/or frequency based on clinical status.    Non-Kinetic Antimicrobial Dosing:   Current Regimen:  Zosyn 3.375 gm IV q6hr  Recommendation: Changed Zosyn to 3.375 gm IV q8hr    Other Antimicrobial  (not dosed by pharmacist)   Cefepime ordered in ED: not given   Cultures     5/7: Blood: pending   Serum Creatinine     Lab Results   Component Value Date/Time    Creatinine (POC) 2.3 10/01/2011  6:48 PM    Creatinine 1.37 09/03/2011  3:40 AM       Creatinine Clearance  26.2 ml/min (Scr = 2.3)     Temp   98.3 ??F (36.8 ??C) ()    WBC   Lab Results   Component Value Date/Time    WBC 6.4 10/01/2011  6:43 PM       H/H   Lab Results   Component Value Date/Time    HGB 7.8 10/01/2011  6:43 PM        Platelets   Lab Results   Component Value Date/Time    PLATELET 184 10/01/2011  6:43 PM          Pharmacist: Signed RITA A BOND Contact information: [phone]

## 2011-10-01 NOTE — ED Notes (Signed)
Patients blood sugar 488, will page MD.

## 2011-10-01 NOTE — H&P (Signed)
[facility]  [address] [place], [place]  [address]  [phone]    Hospitalist Admission Note      NAME:  Travis [name]   DOB:   10-27-1940   MRN:  [id number]     PCP:  Sol Blazing, MD     Date/Time:  10/01/2011 8:33 PM          Subjective:     CHIEF COMPLAINT: foot infection     HISTORY OF PRESENT ILLNESS:     Mr. Karge is a 71 y.o.  African American male who presented to the Emergency Department complaining of foot infection.  He was sent by his wound care specialist.  His Left foot, including the great toe, have been having infection resistant to antibiotics and lavage for weeks.  He is a severe vasculopath, and had a vascular procedure to improve circulation to that leg in February.  He had a heart attack the next month.  His xray in the ER indicates osteomyelitis.  He has uncontrolled DM and CKD, with ARF and anemia on labs tonight. We will admit him for management.    Past Medical History   Diagnosis Date   ??? CAD (coronary artery disease)      see problem list   ??? HTN (hypertension)    ??? PVD (peripheral vascular disease)    ??? Hyperlipidemia    ??? Sleep apnea      on CPAP   ??? COPD (chronic obstructive pulmonary disease)      mild COPD, recurrent pneumonia, chronic bronchitis (followed by Dr. Leroy Libman)   ??? CKD (chronic kidney disease)      Cr 1.7 in 10/09   ??? CHF (congestive heart failure)    ??? Anemia    ??? Pulmonary hypertension      PASP 60 on echo 06/22/09   ??? Pneumonia    ??? GERD (gastroesophageal reflux disease)    ??? Chronic pain      back pain   ??? Cataract    ??? GI bleed      multiple bleeding episodes and blood transfusions.  Dr. Monika Salk has followed..  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   ??? Foot ulcer 07/18/2011   ??? Osteomyelitis    ??? Hypothyroidism    ??? Hx of thromboembolism      leg   ??? DM type 2 causing renal disease    ??? DM foot ulcer         Past Surgical History   Procedure Date   ??? Hx orthopaedic      knee replacement   ??? Pr vascular surgery procedure unlist      arterial bypass    ??? Hx pacemaker 2.24.2011     St. Jude lead, model number 1488TC, serial number [id number]   ??? Pr cardiac surg procedure unlist      cardiac stent   ??? Hx cataract removal      removed rt eye       History   Substance Use Topics   ??? Smoking status: Former Smoker -- 20 years     Types: Cigarettes     Quit date: 11/09/1978   ??? Smokeless tobacco: Never Used   ??? Alcohol Use: No        Family History   Problem Relation Age of Onset   ??? Hypertension     ??? Cancer Mother      cervical and colon   ??? Cancer Father  lung        No Known Allergies     Prior to Admission medications    Medication Sig Start Date End Date Taking? Authorizing Provider   ammonium lactate (LAC-HYDRIN) 12 % lotion Please apply to affected area two times daily (L lower leg) 09/03/11   Gaspar Bidding, MD   bumetanide (BUMEX) 2 mg tablet Take 0.5 Tabs by mouth daily. 09/02/11   Gaspar Bidding, MD   amLODIPine (NORVASC) 5 mg tablet Take 1 Tab by mouth daily. 09/02/11   Gaspar Bidding, MD   donepezil (ARICEPT) 5 mg tablet Take 1 Tab by mouth nightly. 09/02/11   Gaspar Bidding, MD   famotidine (PEPCID) 20 mg tablet Take 1 Tab by mouth daily. 09/02/11   Gaspar Bidding, MD   finasteride (PROSCAR) 5 mg tablet Take 1 Tab by mouth daily. 09/02/11   Gaspar Bidding, MD   ranolazine ER (RANEXA) 500 mg SR tablet Take 1 Tab by mouth two (2) times a day. 09/02/11   Gaspar Bidding, MD   insulin glargine (LANTUS) 100 unit/mL injection 30 Units by SubCUTAneous route daily. 09/02/11   Gaspar Bidding, MD   oxyCODONE-acetaminophen (PERCOCET) 5-325 mg per tablet Take 1 Tab by mouth every four (4) hours as needed for Pain. 09/02/11   Gaspar Bidding, MD   fish oil-omega-3 fatty acids 340-1,000 mg capsule Take 1 Cap by mouth two (2) times a day. 07/24/11   Gaspar Bidding, MD   metoprolol (LOPRESSOR) 100 mg tablet Take 1 Tab by mouth two (2) times a day. 07/23/11   Sherin Quarry, ACNP   atorvastatin (LIPITOR) 40 mg tablet Take 1 Tab by mouth  nightly. 07/23/11   Sherin Quarry, ACNP   isosorbide mononitrate ER (IMDUR) 30 mg tablet Take 1 Tab by mouth daily. 07/23/11   Sherin Quarry, ACNP   Ferrous Sulfate (SLOW FE) 47.5 mg iron TbER tablet Take 1 Tab by mouth two (2) times a day.    Historical Provider   clopidogrel (PLAVIX) 75 mg tablet Take 1 Tab by mouth daily. 06/25/11   Laddie Aquas, MD   albuterol (PROVENTIL HFA, VENTOLIN HFA) 90 mcg/actuation inhaler Take 2 Puffs by inhalation every six (6) hours as needed for Wheezing. 03/11/11   Laddie Aquas, MD   potassium chloride (K-DUR, KLOR-CON) 10 mEq tablet Take 1 Tab by mouth daily. 02/05/11   Laddie Aquas, MD   insulin aspart (NOVOLOG) 100 unit/mL injection by SubCUTAneous route Before breakfast and dinner.      Historical Provider   misoprostol (CYTOTEC) 200 mcg tablet Take 1 Tab by mouth two (2) times a day. 01/26/10   Laddie Aquas, MD   terazosin (HYTRIN) 5 mg capsule Take 10 mg by mouth nightly.    Historical Provider   aspirin 81 mg tablet Take  by mouth daily.    Historical Provider   levothyroxine (SYNTHROID) 150 mcg tablet Take  by mouth daily (before breakfast).    Historical Provider   nitroglycerin (NITROSTAT) 0.4 mg SL tablet by SubLINGual route every five (5) minutes as needed.    Historical Provider       Review of Systems:  (bold if positive, if negative)    Gen:  Eyes:  ENT:  CVS:  Pulm:  GI:    GU:    MS:  Skin:  Psych:  Endo:    Hem:  Renal:    Neuro:  Objective:      VITALS:    Vital signs reviewed; most recent are:    Visit Vitals   Item Reading   ??? BP 147/59   ??? Pulse 76   ??? Temp 98.3 ??F (36.8 ??C)   ??? Resp 18   ??? Ht 5\' 6"  (1.676 m)   ??? Wt 177 lb   ??? BMI 28.57 kg/m2   ??? SpO2 95%     SpO2 Readings from Last 6 Encounters:   10/01/11 95%   09/03/11 95%   08/13/11 99%   08/13/11 99%   07/24/11 98%   05/25/11 97%        No intake or output data in the 24 hours ending 10/01/11 2033     Exam:     Physical Exam:    Gen:  Well-developed, well-nourished, in no acute distress  HEENT:  Pink  conjunctivae, PERRL, hearing intact to voice, moist mucous membranes  Neck:  Supple, without masses, thyroid non-tender  Resp:  No accessory muscle use, clear breath sounds without wheezes rales or rhonchi  Card:  No murmurs, normal S1, S2 without thrills, bruits or peripheral edema  Abd:  Soft, non-tender, non-distended, normoactive bowel sounds are present, no palpable organomegaly and no detectable hernias  Lymph:  No cervical or inguinal adenopathy  Musc:  No cyanosis or clubbing  Skin:  No rashes or ulcers, skin turgor is good  Neuro:  Cranial nerves are grossly intact, no focal motor weakness, follows commands appropriately  Psych:  Good insight, oriented to person, place and time, alert     Labs:    Recent Labs   [facility] 10/01/11 1843    WBC 6.4    HGB 7.8*    HCT 23.5*    PLT 184     No results found for this basename: NA:1,K:1,CL:1,CO2:1,GLU:1,BUN:1,CREA:1,CA:1,MG:1,PHOS:1,ALB:1,TBIL:1,SGOT:1,ALT:1 in the last 72 hours  Lab Results   Component Value Date/Time    POC GLUCOSE 139 09/03/2011 11:45 AM    POC GLUCOSE 102 09/03/2011  7:49 AM     No results found for this basename: PH:1,PCO2:1,PO2:1,HCO3:1,FIO2:1 in the last 72 hours  No results found for this basename: INR:1 in the last 72 hours       Assessment and Plan:      Osteomyelitis / Foot ulcer - Start empiric vanco and zosyn.  Culture and MRSA screen.  Consult vascular.      PAD (peripheral artery disease) / PVD (peripheral vascular disease) - Severe.  Consult vascular.  Notes indicate PVR tracing 05/18/2011 suggest bilateral SFA occlusion and calf vessel disease and high grade stenosis of his proximal fem-pop. Angio March 2012 [facility]): r sfa-ak pop with prox stenosis, 1 vs r/o; left    diffuse sfa , severe tpt disease    CAD (coronary artery disease) / CHF (congestive heart failure) - Multiple STEMIs and stents.  Consult cardiology to clear him for likley surgery.  For now admit to tele, and continue Plavix and ASA (although may have to hold those prior to  any surgery), Ranexa, BB, IMDUR    ARF (acute renal failure) / Hyponatremia / Hyperkalemia / CKD (chronic kidney disease) stage 3 - Consult renal. Gentle hydration with NS due to CHF.  Hold bumex now.     Dm with renal complications / DM foot ulcer / Uncontrolled.- Resume lantus, DM diet , SSI per protocol.    Anemia of other chronic disease - follow.  May need transfusion after hydration.  EPO per renal.  Continue  PO iron.    SSS (sick sinus syndrome) - Pacer palced RV: St. Jude lead, model number 2088, serial number [id number], RA: St. Jude lead, model number 1488TC, serial number [id number], Generator by [employer], model number [id number], serial number [id number].  Consult cardiology who can determine if any interrogation is needed.  Follow on tele.      HTN (hypertension) - Continue norvasc, metoprolol    Dementia - Stable and mild.  Continue aricept.    Chronic pain - back pain    GERD (gastroesophageal reflux disease) - Continue PPI and cytotec    Hypothyroidism - Continue synthroid    Hx of thromboembolism - leg. Monitor.  May need heparin, but await vascular opinion.    Pulmonary hypertension - PASP 60 on echo 06/22/09    COPD (chronic obstructive pulmonary disease) - mild COPD, Hx recurrent pneumonia, chronic bronchitis (followed by Dr. Leroy Libman)    Sleep apnea - on CPAP    Hyperlipidemia - Continue fish oil and lipitor    BPH- Continue Proscar and terazosin        Telemetry reviewed:   Paced    Risk of deterioration: high      Total time spent with patient: 4 Minutes                  Care Plan discussed with: Patient, Family, Nursing Staff and >50% of time spent in counseling and coordination of care    Discussed:  Care Plan       ___________________________________________________    Attending Physician: Salvadore Dom, MD

## 2011-10-01 NOTE — ED Provider Notes (Signed)
Patient is a 71 y.o. male presenting with skin problem. The history is provided by the patient.   Skin Problem   This is a new problem. The current episode started more than 2 days ago. The problem has not changed since onset.There has been no fever. The pain is moderate. The pain has been constant since onset. Associated symptoms include blisters and weeping.    71 yo M with hx of DM who presents with L foot pain infection from wound clinic. Pt denies fevers. They sent him here for iv abx and admission.     Past Medical History   Diagnosis Date   ??? CAD (coronary artery disease)      see problem list   ??? HTN (hypertension)    ??? PVD (peripheral vascular disease)    ??? DM (diabetes mellitus)    ??? Hyperlipidemia    ??? Sleep apnea      on CPAP   ??? COPD (chronic obstructive pulmonary disease)      mild COPD, recurrent pneumonia, chronic bronchitis (followed by Dr. Leroy Libman)   ??? CKD (chronic kidney disease)      Cr 1.7 in 10/09   ??? CHF (congestive heart failure)    ??? Anemia    ??? Pulmonary hypertension      PASP 60 on echo 06/22/09   ??? Thromboembolus      leg    ??? Thyroid disease    ??? Pneumonia    ??? GERD (gastroesophageal reflux disease)    ??? Chronic pain      back pain   ??? Cataract    ??? GI bleed      multiple bleeding episodes and blood transfusions.  Dr. Monika Salk has followed..  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   ??? Foot ulcer 07/18/2011   ??? Osteomyelitis         Past Surgical History   Procedure Date   ??? Hx orthopaedic      knee replacement   ??? Pr vascular surgery procedure unlist      arterial bypass   ??? Hx pacemaker 2.24.2011     St. Jude lead, model number 1488TC, serial number [id number]   ??? Pr cardiac surg procedure unlist      cardiac stent   ??? Hx cataract removal      removed rt eye         Family History   Problem Relation Age of Onset   ??? Hypertension     ??? Cancer Mother      cervical and colon   ??? Cancer Father      lung        History     Social History   ??? Marital Status: SINGLE     Spouse Name: N/A     Number of Children: N/A    ??? Years of Education: N/A     Occupational History   ??? Not on file.     Social History Main Topics   ??? Smoking status: Former Smoker -- 20 years     Types: Cigarettes     Quit date: 11/09/1978   ??? Smokeless tobacco: Never Used   ??? Alcohol Use: No   ??? Drug Use: No   ??? Sexually Active: Not on file     Other Topics Concern   ??? Not on file     Social History Narrative   ??? No narrative on file  ALLERGIES: Review of patient's allergies indicates no known allergies.      Review of Systems   Constitutional: Negative for fever and chills.   Respiratory: Negative for cough and shortness of breath.    Gastrointestinal: Negative for nausea, vomiting, abdominal pain and diarrhea.   Skin: Positive for wound.   All other systems reviewed and are negative.        Filed Vitals:    10/01/11 1809   BP: 147/59   Pulse: 76   Temp: 98.3 ??F (36.8 ??C)   Resp: 18   Height: 5\' 6"  (1.676 m)   Weight: 80.287 kg (177 lb)   SpO2: 95%            Physical Exam   Nursing note and vitals reviewed.  Constitutional: He is oriented to person, place, and time. He appears well-developed and well-nourished. No distress.   HENT:   Head: Normocephalic and atraumatic.   Right Ear: External ear normal.   Left Ear: External ear normal.   Eyes: Conjunctivae and EOM are normal. Pupils are equal, round, and reactive to light. Right eye exhibits no discharge. Left eye exhibits no discharge. No scleral icterus.   Neck: Normal range of motion. Neck supple. No tracheal deviation present.   Cardiovascular: Normal rate, normal heart sounds and intact distal pulses.    No murmur heard.  Pulmonary/Chest: Effort normal. No respiratory distress. He has no wheezes. He has rales.   Abdominal: He exhibits no distension.   Musculoskeletal: He exhibits edema and tenderness.        Purulent drainage from L foot large toe and medial aspect    Neurological: He is alert and oriented to person, place, and time. No cranial nerve deficit. Coordination normal.   Skin:  Skin is warm. No rash noted. He is not diaphoretic. No erythema.   Psychiatric: He has a normal mood and affect. His behavior is normal. Judgment and thought content normal.        MDM    Procedures

## 2011-10-01 NOTE — ED Notes (Signed)
Patient care transferred to Iva Lento, RN due to patient needing a monitored bed. SBAR given at bedside.

## 2011-10-02 LAB — HEMOGLOBIN A1C WITH EAG
Est. average glucose: 197 mg/dL
Hemoglobin A1c: 8.5 % — ABNORMAL HIGH (ref 4.2–6.3)

## 2011-10-02 LAB — METABOLIC PANEL, COMPREHENSIVE
A-G Ratio: 0.4 — ABNORMAL LOW (ref 1.1–2.2)
ALT (SGPT): 17 U/L (ref 12–78)
AST (SGOT): 16 U/L (ref 15–37)
Albumin: 2.2 g/dL — ABNORMAL LOW (ref 3.5–5.0)
Alk. phosphatase: 99 U/L (ref 50–136)
Anion gap: 6 mmol/L (ref 5–15)
BUN/Creatinine ratio: 15 (ref 12–20)
BUN: 35 MG/DL — ABNORMAL HIGH (ref 6–20)
Bilirubin, total: 0.5 MG/DL (ref 0.2–1.0)
CO2: 28 MMOL/L (ref 21–32)
Calcium: 7.6 MG/DL — ABNORMAL LOW (ref 8.5–10.1)
Chloride: 100 MMOL/L (ref 97–108)
Creatinine: 2.28 MG/DL — ABNORMAL HIGH (ref 0.45–1.15)
GFR est AA: 34 mL/min/{1.73_m2} — ABNORMAL LOW (ref >60)
GFR est non-AA: 28 mL/min/{1.73_m2} — ABNORMAL LOW (ref >60)
Globulin: 5.1 g/dL — ABNORMAL HIGH (ref 2.0–4.0)
Glucose: 277 MG/DL — ABNORMAL HIGH (ref 65–100)
Potassium: 4.5 MMOL/L (ref 3.5–5.1)
Protein, total: 7.3 g/dL (ref 6.4–8.2)
Sodium: 134 MMOL/L — ABNORMAL LOW (ref 136–145)

## 2011-10-02 LAB — CBC WITH AUTOMATED DIFF
ABS. BASOPHILS: 0 10*3/uL (ref 0.0–0.1)
ABS. EOSINOPHILS: 0 10*3/uL (ref 0.0–0.4)
ABS. LYMPHOCYTES: 0.9 10*3/uL (ref 0.8–3.5)
ABS. MONOCYTES: 0.7 10*3/uL (ref 0.0–1.0)
ABS. NEUTROPHILS: 3.6 10*3/uL (ref 1.8–8.0)
BASOPHILS: 0 % (ref 0–1)
EOSINOPHILS: 1 % (ref 0–7)
HCT: 21.5 % — ABNORMAL LOW (ref 36.6–50.3)
HGB: 7.2 g/dL — ABNORMAL LOW (ref 12.1–17.0)
LYMPHOCYTES: 17 % (ref 12–49)
MCH: 28.3 PG (ref 26.0–34.0)
MCHC: 33.5 g/dL (ref 30.0–36.5)
MCV: 84.6 FL (ref 80.0–99.0)
MONOCYTES: 13 % (ref 5–13)
NEUTROPHILS: 69 % (ref 32–75)
PLATELET: 167 10*3/uL (ref 150–400)
RBC: 2.54 M/uL — ABNORMAL LOW (ref 4.10–5.70)
RDW: 17.8 % — ABNORMAL HIGH (ref 11.5–14.5)
WBC: 5.2 10*3/uL (ref 4.1–11.1)

## 2011-10-02 LAB — GLUCOSE, POC
Glucose (POC): 488 mg/dL — ABNORMAL HIGH (ref 75–110)
Glucose (POC): 188 mg/dL — ABNORMAL HIGH (ref 75–110)
Glucose (POC): 197 mg/dL — ABNORMAL HIGH (ref 75–110)

## 2011-10-02 LAB — PHOSPHORUS: Phosphorus: 2.9 MG/DL (ref 2.5–4.9)

## 2011-10-02 LAB — PROTEIN URINE, RANDOM: Protein, urine random: 38 MG/DL — ABNORMAL HIGH (ref 0.0–11.9)

## 2011-10-02 LAB — FERRITIN: Ferritin: 411 NG/ML — ABNORMAL HIGH (ref 26–388)

## 2011-10-02 LAB — CREATININE, UR, RANDOM: Creatinine, urine random: 105.27 mg/dL

## 2011-10-02 LAB — MAGNESIUM: Magnesium: 1.7 MG/DL (ref 1.6–2.4)

## 2011-10-02 LAB — SODIUM, UR, RANDOM: Sodium,urine random: 29 MMOL/L

## 2011-10-02 MED ORDER — SODIUM CHLORIDE 0.9 % IV PIGGY BACK
3.375 gram | Freq: Four times a day (QID) | INTRAVENOUS | Status: DC
Start: 2011-10-02 — End: 2011-10-01

## 2011-10-02 MED ORDER — NALOXONE 0.4 MG/ML INJECTION
0.4 mg/mL | INTRAMUSCULAR | Status: DC | PRN
Start: 2011-10-02 — End: 2011-10-09

## 2011-10-02 MED ORDER — ACETAMINOPHEN 325 MG TABLET
325 mg | ORAL | Status: DC | PRN
Start: 2011-10-02 — End: 2011-10-09
  Administered 2011-10-03: 03:00:00 via ORAL

## 2011-10-02 MED ORDER — INSULIN ASPART 100 UNIT/ML INJECTION
100 unit/mL | Freq: Two times a day (BID) | SUBCUTANEOUS | Status: DC
Start: 2011-10-02 — End: 2011-10-01

## 2011-10-02 MED ORDER — PHARMACY INFORMATION NOTE
Status: DC | PRN
Start: 2011-10-02 — End: 2011-10-03

## 2011-10-02 MED ORDER — NITROGLYCERIN 0.4 MG SUBLINGUAL TAB
0.4 mg | SUBLINGUAL | Status: DC | PRN
Start: 2011-10-02 — End: 2011-10-09

## 2011-10-02 MED ORDER — SODIUM CHLORIDE 0.9 % IV
10 gram | Freq: Once | INTRAVENOUS | Status: AC
Start: 2011-10-02 — End: 2011-10-02
  Administered 2011-10-02: 02:00:00 via INTRAVENOUS

## 2011-10-02 MED ORDER — ASPIRIN 81 MG CHEWABLE TAB
81 mg | Freq: Every day | ORAL | Status: DC
Start: 2011-10-02 — End: 2011-10-09
  Administered 2011-10-02 – 2011-10-09 (×8): via ORAL

## 2011-10-02 MED ORDER — METOPROLOL TARTRATE 50 MG TAB
50 mg | Freq: Two times a day (BID) | ORAL | Status: DC
Start: 2011-10-02 — End: 2011-10-09
  Administered 2011-10-02 – 2011-10-09 (×16): via ORAL

## 2011-10-02 MED ORDER — MISOPROSTOL 200 MCG TAB
200 mcg | Freq: Two times a day (BID) | ORAL | Status: DC
Start: 2011-10-02 — End: 2011-10-09
  Administered 2011-10-02 – 2011-10-09 (×16): via ORAL

## 2011-10-02 MED ORDER — PIPERACILLIN-TAZOBACTAM 3.375 GRAM IV SOLR
3.375 gram | Freq: Three times a day (TID) | INTRAVENOUS | Status: DC
Start: 2011-10-02 — End: 2011-10-08
  Administered 2011-10-02 – 2011-10-08 (×19): via INTRAVENOUS

## 2011-10-02 MED ORDER — MORPHINE 2 MG/ML INJECTION
2 mg/mL | INTRAMUSCULAR | Status: DC | PRN
Start: 2011-10-02 — End: 2011-10-09
  Administered 2011-10-07 – 2011-10-08 (×2): via INTRAVENOUS

## 2011-10-02 MED ORDER — INSULIN GLARGINE 100 UNIT/ML INJECTION
100 unit/mL | Freq: Every day | SUBCUTANEOUS | Status: DC
Start: 2011-10-02 — End: 2011-10-04
  Administered 2011-10-02 – 2011-10-04 (×3): via SUBCUTANEOUS

## 2011-10-02 MED ORDER — AMLODIPINE 5 MG TAB
5 mg | Freq: Every day | ORAL | Status: DC
Start: 2011-10-02 — End: 2011-10-07
  Administered 2011-10-02 – 2011-10-06 (×5): via ORAL

## 2011-10-02 MED ORDER — INSULIN LISPRO 100 UNIT/ML INJECTION
100 unit/mL | Freq: Once | SUBCUTANEOUS | Status: AC
Start: 2011-10-02 — End: 2011-10-01
  Administered 2011-10-02: 03:00:00 via SUBCUTANEOUS

## 2011-10-02 MED ORDER — DIPHENHYDRAMINE HCL 50 MG/ML IJ SOLN
50 mg/mL | INTRAMUSCULAR | Status: DC | PRN
Start: 2011-10-02 — End: 2011-10-09

## 2011-10-02 MED ORDER — TERAZOSIN 5 MG CAP
5 mg | Freq: Every evening | ORAL | Status: DC
Start: 2011-10-02 — End: 2011-10-09
  Administered 2011-10-02 – 2011-10-09 (×8): via ORAL

## 2011-10-02 MED ORDER — OXYCODONE-ACETAMINOPHEN 5 MG-325 MG TAB
5-325 mg | ORAL | Status: DC | PRN
Start: 2011-10-02 — End: 2011-10-09
  Administered 2011-10-04 – 2011-10-08 (×3): via ORAL

## 2011-10-02 MED ORDER — ALBUTEROL SULFATE 0.083 % (0.83 MG/ML) SOLN FOR INHALATION
2.5 mg /3 mL (0.083 %) | RESPIRATORY_TRACT | Status: DC | PRN
Start: 2011-10-02 — End: 2011-10-09
  Administered 2011-10-02 – 2011-10-03 (×3): via RESPIRATORY_TRACT

## 2011-10-02 MED ORDER — FERROUS SULFATE 325 MG (65 MG ELEMENTAL IRON) TAB
325 mg (65 mg iron) | Freq: Two times a day (BID) | ORAL | Status: DC
Start: 2011-10-02 — End: 2011-10-09
  Administered 2011-10-02 – 2011-10-09 (×15): via ORAL

## 2011-10-02 MED ORDER — SODIUM CHLORIDE 0.9 % IV PIGGY BACK
2 gram | INTRAVENOUS | Status: DC
Start: 2011-10-02 — End: 2011-10-01

## 2011-10-02 MED ORDER — RANOLAZINE SR 500 MG 12 HR TAB
500 mg | Freq: Two times a day (BID) | ORAL | Status: DC
Start: 2011-10-02 — End: 2011-10-09
  Administered 2011-10-02 – 2011-10-09 (×16): via ORAL

## 2011-10-02 MED ORDER — GLUCAGON 1 MG INJECTION
1 mg | INTRAMUSCULAR | Status: DC | PRN
Start: 2011-10-02 — End: 2011-10-09

## 2011-10-02 MED ORDER — PROCHLORPERAZINE EDISYLATE 5 MG/ML INJECTION
5 mg/mL | Freq: Three times a day (TID) | INTRAMUSCULAR | Status: DC | PRN
Start: 2011-10-02 — End: 2011-10-09

## 2011-10-02 MED ORDER — ISOSORBIDE MONONITRATE SR 30 MG 24 HR TAB
30 mg | Freq: Every day | ORAL | Status: DC
Start: 2011-10-02 — End: 2011-10-09
  Administered 2011-10-02 – 2011-10-09 (×8): via ORAL

## 2011-10-02 MED ORDER — DONEPEZIL 5 MG TAB
5 mg | Freq: Every evening | ORAL | Status: DC
Start: 2011-10-02 — End: 2011-10-09
  Administered 2011-10-02 – 2011-10-09 (×8): via ORAL

## 2011-10-02 MED ORDER — SODIUM CHLORIDE 0.9 % IV
INTRAVENOUS | Status: DC
Start: 2011-10-02 — End: 2011-10-03
  Administered 2011-10-02 – 2011-10-03 (×2): via INTRAVENOUS

## 2011-10-02 MED ORDER — HEPARIN (PORCINE) 5,000 UNIT/ML IJ SOLN
5000 unit/mL | Freq: Three times a day (TID) | INTRAMUSCULAR | Status: DC
Start: 2011-10-02 — End: 2011-10-06
  Administered 2011-10-02 – 2011-10-06 (×7): via SUBCUTANEOUS

## 2011-10-02 MED ORDER — BUMETANIDE 1 MG TAB
1 mg | Freq: Every day | ORAL | Status: DC
Start: 2011-10-02 — End: 2011-10-05
  Administered 2011-10-02 – 2011-10-04 (×3): via ORAL

## 2011-10-02 MED ORDER — .PHARMACY TO SUBSTITUTE PER PROTOCOL
Status: DC
Start: 2011-10-02 — End: 2011-10-01

## 2011-10-02 MED ORDER — INSULIN LISPRO 100 UNIT/ML INJECTION
100 unit/mL | Freq: Four times a day (QID) | SUBCUTANEOUS | Status: DC
Start: 2011-10-02 — End: 2011-10-04
  Administered 2011-10-02 – 2011-10-04 (×7): via SUBCUTANEOUS

## 2011-10-02 MED ORDER — ONDANSETRON (PF) 4 MG/2 ML INJECTION
4 mg/2 mL | INTRAMUSCULAR | Status: DC | PRN
Start: 2011-10-02 — End: 2011-10-09

## 2011-10-02 MED ORDER — GLUCOSE 4 GRAM CHEWABLE TAB
4 gram | ORAL | Status: DC | PRN
Start: 2011-10-02 — End: 2011-10-09

## 2011-10-02 MED ORDER — DEXTROSE 50% IN WATER (D50W) IV SYRG
INTRAVENOUS | Status: DC | PRN
Start: 2011-10-02 — End: 2011-10-09

## 2011-10-02 MED ORDER — PHARMACY INFORMATION NOTE
Freq: Once | Status: AC
Start: 2011-10-02 — End: 2011-10-02
  Administered 2011-10-03: 01:00:00

## 2011-10-02 MED ORDER — LEVOTHYROXINE 150 MCG TAB
150 mcg | Freq: Every day | ORAL | Status: DC
Start: 2011-10-02 — End: 2011-10-09
  Administered 2011-10-02 – 2011-10-09 (×9): via ORAL

## 2011-10-02 MED ORDER — ATORVASTATIN 20 MG TAB
20 mg | Freq: Every evening | ORAL | Status: DC
Start: 2011-10-02 — End: 2011-10-09
  Administered 2011-10-02 – 2011-10-09 (×8): via ORAL

## 2011-10-02 MED ORDER — EPOETIN ALFA 10,000 UNIT/ML IJ SOLN
10000 unit/mL | INTRAMUSCULAR | Status: DC
Start: 2011-10-02 — End: 2011-10-09
  Administered 2011-10-02: 14:00:00 via SUBCUTANEOUS

## 2011-10-02 MED ORDER — FINASTERIDE 5 MG TAB
5 mg | Freq: Every day | ORAL | Status: DC
Start: 2011-10-02 — End: 2011-10-09
  Administered 2011-10-02 – 2011-10-09 (×8): via ORAL

## 2011-10-02 MED ORDER — FISH OIL-OMEGA-3 FATTY ACIDS 1,000 MG-340 MG CAP
340-1000 mg | Freq: Two times a day (BID) | ORAL | Status: DC
Start: 2011-10-02 — End: 2011-10-09
  Administered 2011-10-02 – 2011-10-09 (×16): via ORAL

## 2011-10-02 MED ORDER — FAMOTIDINE 20 MG TAB
20 mg | Freq: Every day | ORAL | Status: DC
Start: 2011-10-02 — End: 2011-10-09
  Administered 2011-10-02 – 2011-10-09 (×8): via ORAL

## 2011-10-02 MED ORDER — CLOPIDOGREL 75 MG TAB
75 mg | Freq: Every day | ORAL | Status: DC
Start: 2011-10-02 — End: 2011-10-07
  Administered 2011-10-02 – 2011-10-06 (×4): via ORAL

## 2011-10-02 MED FILL — FINASTERIDE 5 MG TAB: 5 mg | ORAL | Qty: 1

## 2011-10-02 MED FILL — FAMOTIDINE 20 MG TAB: 20 mg | ORAL | Qty: 1

## 2011-10-02 MED FILL — FERROUS SULFATE 325 MG (65 MG ELEMENTAL IRON) TAB: 325 mg (65 mg iron) | ORAL | Qty: 1

## 2011-10-02 MED FILL — METOPROLOL TARTRATE 50 MG TAB: 50 mg | ORAL | Qty: 2

## 2011-10-02 MED FILL — AMLODIPINE 5 MG TAB: 5 mg | ORAL | Qty: 1

## 2011-10-02 MED FILL — INSULIN LISPRO 100 UNIT/ML INJECTION: 100 unit/mL | SUBCUTANEOUS | Qty: 12

## 2011-10-02 MED FILL — INSULIN LISPRO 100 UNIT/ML INJECTION: 100 unit/mL | SUBCUTANEOUS | Qty: 1

## 2011-10-02 MED FILL — MISOPROSTOL 200 MCG TAB: 200 mcg | ORAL | Qty: 1

## 2011-10-02 MED FILL — ISOSORBIDE MONONITRATE SR 30 MG 24 HR TAB: 30 mg | ORAL | Qty: 1

## 2011-10-02 MED FILL — RANEXA 500 MG TABLET,EXTENDED RELEASE: 500 mg | ORAL | Qty: 1

## 2011-10-02 MED FILL — LIPITOR 10 MG TABLET: 10 mg | ORAL | Qty: 4

## 2011-10-02 MED FILL — HEPARIN (PORCINE) 5,000 UNIT/ML IJ SOLN: 5000 unit/mL | INTRAMUSCULAR | Qty: 1

## 2011-10-02 MED FILL — INSULIN GLARGINE 100 UNIT/ML INJECTION: 100 unit/mL | SUBCUTANEOUS | Qty: 0.3

## 2011-10-02 MED FILL — SODIUM CHLORIDE 0.9 % IV: INTRAVENOUS | Qty: 1000

## 2011-10-02 MED FILL — PHARMACY INFORMATION NOTE: Qty: 1

## 2011-10-02 MED FILL — FISH OIL 340 MG-1,000 MG CAPSULE: 340-1000 mg | ORAL | Qty: 1

## 2011-10-02 MED FILL — ALBUTEROL SULFATE 0.083 % (0.83 MG/ML) SOLN FOR INHALATION: 2.5 mg /3 mL (0.083 %) | RESPIRATORY_TRACT | Qty: 1

## 2011-10-02 MED FILL — CEFEPIME 2 GRAM SOLUTION FOR INJECTION: 2 gram | INTRAMUSCULAR | Qty: 2

## 2011-10-02 MED FILL — ARICEPT 5 MG TABLET: 5 mg | ORAL | Qty: 1

## 2011-10-02 MED FILL — LEVOTHYROXINE 150 MCG TAB: 150 mcg | ORAL | Qty: 1

## 2011-10-02 MED FILL — PROCRIT 10,000 UNIT/ML INJECTION SOLUTION: 10000 unit/mL | INTRAMUSCULAR | Qty: 1

## 2011-10-02 MED FILL — VANCOMYCIN 10 GRAM IV SOLR: 10 gram | INTRAVENOUS | Qty: 2

## 2011-10-02 MED FILL — TERAZOSIN 5 MG CAP: 5 mg | ORAL | Qty: 2

## 2011-10-02 MED FILL — BUMETANIDE 1 MG TAB: 1 mg | ORAL | Qty: 1

## 2011-10-02 MED FILL — PIPERACILLIN-TAZOBACTAM 3.375 GRAM IV SOLR: 3.375 gram | INTRAVENOUS | Qty: 3.38

## 2011-10-02 MED FILL — PLAVIX 75 MG TABLET: 75 mg | ORAL | Qty: 1

## 2011-10-02 MED FILL — INSULIN LISPRO 100 UNIT/ML INJECTION: 100 unit/mL | SUBCUTANEOUS | Qty: 2

## 2011-10-02 MED FILL — ASPIRIN 81 MG CHEWABLE TAB: 81 mg | ORAL | Qty: 1

## 2011-10-02 MED FILL — ZOSYN 3.375 GRAM INTRAVENOUS SOLUTION: 3.375 gram | INTRAVENOUS | Qty: 3.38

## 2011-10-02 NOTE — Progress Notes (Signed)
Spiritual Care Assessment/Progress Notes    Travis [name] [id number]  [id number]    [birth year]  71 y.o.  male    Patient Telephone Number: [phone] (home)   Religious Affiliation: Non denominational   Language: English   Extended Emergency Contact Information  Primary Emergency Contact: W,[name]   [place]  Home Phone: [phone]  Relation: Child   Patient Active Problem List   Diagnoses Date Noted   ??? Hyponatremia 10/01/2011   ??? Hyperkalemia 10/01/2011   ??? ARF (acute renal failure) 10/01/2011   ??? Osteomyelitis    ??? Chronic pain    ??? GERD (gastroesophageal reflux disease)    ??? Hypothyroidism    ??? Hx of thromboembolism    ??? Pulmonary hypertension    ??? CHF (congestive heart failure)    ??? COPD (chronic obstructive pulmonary disease)    ??? Sleep apnea    ??? PVD (peripheral vascular disease)    ??? Dementia 08/31/2011   ??? SSS (sick sinus syndrome) 08/27/2011   ??? Anemia of other chronic disease 08/21/2011   ??? CKD (chronic kidney disease) stage 3, GFR 30-59 ml/min 07/18/2011   ??? Foot ulcer 07/18/2011   ??? PAD (peripheral artery disease) 05/18/2011   ??? HTN (hypertension) 10/13/2009   ??? Hyperlipidemia 10/06/2009   ??? CAD (coronary artery disease)         Date: 10/02/2011       Level of Religious/Spiritual Activity:  []          Involved in faith tradition/spiritual practice    []          Not involved in faith tradition/spiritual practice  [x]          Spiritually oriented    []          Claims no spiritual orientation    []          seeking spiritual identity  []          Feels alienated from religious practice/tradition  []          Feels angry about religious practice/tradition  [x]          Spirituality/religious tradition is a Theatre stage manager for coping at this time.  []          Not able to assess due to medical condition    Services Provided Today:  []          crisis intervention    []          reading Scriptures  [x]          spiritual assessment    [x]          prayer  [x]          empathic listening/emotional support   []          rites and rituals (cite in comments)  []          life review     []          religious support  []          theological development   []          advocacy  []          ethical dialog     []          blessing  []          bereavement support    []          support to family  []          anticipatory grief support   []   help with AMD  []          spiritual guidance    []          meditation      Spiritual Care Needs  [x]          Emotional Support  [x]          Spiritual/Religious Care  []          Loss/Adjustment  []          Advocacy/Referral /Ethics  []          No needs expressed at this time  []          Other: (note in comments)  Spiritual Care Plan  [x]          Follow up visits with pt/family  []          Provide materials  []          Schedule sacraments  []          Contact Community Clergy  []          Follow up as needed  []          Other: (note in comments)     Comments: Pastoral support and prayer with patient who notes concern for his son Deniece Portela and his son's health also. He also notes his daughter's concern for both her and his son. Patient appreciative of prayer. Brief visit only at this time as staff needing to work with patient. Pastoral care continuing to follow for support as needed.   Delaney Meigs, Chaplain, M.Div, [facility]  [phone]

## 2011-10-02 NOTE — Progress Notes (Addendum)
Tylenol 650mg  po for temp 99.8. Pt tolerating transfusion of PRBCs w/o symptoms of a transfusion reaction.2310 Vancomycin level drawn and sent to lab

## 2011-10-02 NOTE — ED Notes (Signed)
Bedside shift change report given to Claire RN (oncoming nurse) by Melissa G. RN (offgoing nurse).  Report given with SBAR, Kardex, ED Summary and MAR.

## 2011-10-02 NOTE — Progress Notes (Signed)
Patient well known to me.  Has osteo of that great toe.  I think his circulation should be adequate to heal an amputation.  Will discuss with daughter and make plans.Marland Kitchen

## 2011-10-02 NOTE — Other (Signed)
DCT:  Pt well known to DTC from previous admissions.  DM done with pt and family Feb. 2013.  Pt also seen in March 2013.  Pt in process of moving from ED to [facility].  BG on adm. 488, currently 188.  A1C 8.5  Home meds ordered - Lantus 30 units, Humalog correction scale.  DTC to follow with ed when appropriate prior to discharge.

## 2011-10-02 NOTE — Progress Notes (Signed)
Bedside and Verbal shift change report given to Santina Evans, Charity fundraiser (oncoming nurse) by Jens Som, RN  (offgoing nurse).  Report given with SBAR, Kardex, Procedure Summary, Intake/Output, MAR and Recent Results.

## 2011-10-02 NOTE — Consults (Signed)
Lilli Few, MD    [address] [place], [place] [address]    Office [phone]  Pager [phone]    Date of  Admission: 10/01/2011  6:11 PM  PCP- Phys Other, MD    Travis [name] is a 71 y.o. male admitted for Osteomyelitis. Consult requested by Dr Eulah Pont    Assessment/Plan    L foot infection/osteomyelitis  CAD - medical management /Limited options for revascularization  Ischemic CM - clinically euvolemic  SSS - s/p PPM- [employer]'s  Severe PVD  Acute and chronic CKD  Anemia  HTN/HLD/DM/OSA    Plan:  Continue ASA/Plavix/Lipitor/Imdur/Ranexa/Lopressor/Bumex 1mg  Travis [name]  I and O  Daily wts  Mod to high risk for non vascular surgery.  No need for further cardiac testing.  Dr Welton Flakes to see pt am               I appreciate the opportunity to be involved in [employer]. See below note for details. Please do not hesitate to contact us with questions or concerns.    Lilli Few, MD    Cardiac Testing/ Procedures:    1) stress test 02/09/07. The type of test was a dobutamine stress perfusion imaging. Cardiac stress testing was negative for chest pain and myocardial ischemia. Myocardial perfusion imaging showed reversible defect(s) involving the inferior wall. Gated EF (%): 55.   2) echocardiogram 01/19/07. Echocardiogram demonstrated normal LV wall motion and ejection fraction, LVH and left atrial enlargement.   3) Carotid Dopplers in July 2009 showed bilateral 50-79% stenosis.   4) Carotids 07/08/08 - 50-79% stenosis bilat   5) Echo 04/03/09 - TDS, moderate LVH, LVEF 65%; moderate LAE, RAE, mild AS (mean gradient 10), mild MR   6) Echo 1/11 - mild LVH, LAE, EF 55%, mild Pulm HTN, mild MR   7) Cath 06/05/09- DES to RCA   8) cath 06/22/09 - LAD 50%, LCX 100%, mid RCA patent stent, distal RCA 40%   9) Echo 06/22/09 - mild LVH, EF 55-60%, mild LAE, mild MR/TR, PASP 60  Last cath 07/22/11-- Moderate left ventricular systolic dysfunction (EF 40%) with basal-mid  inferior  akinesis.  -- No significant aortic valve gradient.  -- The mitral valve exhibited 1+ regurgitation.    -- CORONARY CIRCULATION:  -- Distal left main: There was a 30 % stenosis.  -- Mid LAD: There was a diffuse 30 % stenosis.  -- Distal LAD: 70% stenosis of apical LAD.  -- Mid circumflex: There was a 100 % stenosis at the site of a prior  stent. This lesion is a chronic total occlusion. -- 1st obtuse marginal:  There was a discrete 80 % stenosis at the ostium of the vessel segment.  -- Proximal RCA: There was a diffuse 80 % stenosis.  -- Mid RCA: There was a 100 % stenosis at the site of a prior stent. This  lesion is a chronic total occlusion.    Last ECHO 07/19/11  Left ventricle: There was moderate hypokinesis of the basal-mid  inferoseptal and basal-mid inferior wall(s). Features were consistent with  a pseudonormal left ventricular filling pattern, with concomitant abnormal  relaxation and increased filling pressure (grade 2 diastolic dysfunction). EF 45%    Left atrium: The atrium was mildly dilated.    Tricuspid valve: There was mild regurgitation.    Inferior vena cava, hepatic veins: The respirophasic change in diameter  was more than 50%.          Care  Plan discussed with: Patient and Nursing Staff    Subjective:  71 yr old male (Cardiologist - Dr Welton Flakes) admitted for L foot infection. Hx of CAD - LM - distal 30%/CTO of Lcflex/CTO of RCA/LAD prox 30%; apical 70%; ischemic cardiomyopathy - EF 45%, severe PVD; CKD. Has been admitted for ACS/NSTEMI  Previously - most recent 4/13 - medical management for his CAD since revascularisation options are limited. No CP.     Past Medical History   Diagnosis Date   ??? CAD (coronary artery disease)      see problem list   ??? HTN (hypertension)    ??? PVD (peripheral vascular disease)    ??? Hyperlipidemia    ??? Sleep apnea      on CPAP   ??? COPD (chronic obstructive pulmonary disease)      mild COPD, recurrent pneumonia, chronic bronchitis (followed by Dr. Leroy Libman)   ??? CKD (chronic  kidney disease)      Cr 1.7 in 10/09   ??? CHF (congestive heart failure)    ??? Anemia    ???          ??? Pneumonia    ??? GERD (gastroesophageal reflux disease)    ??? Chronic pain      back pain   ??? Cataract    ??? GI bleed      multiple bleeding episodes and blood transfusions.  Dr. Monika Salk has followed..  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   ??? Foot ulcer 07/18/2011   ??? Osteomyelitis    ??? Hypothyroidism    ??? Hx of thromboembolism      leg   ??? DM type 2 causing renal disease    ??? DM foot ulcer       Past Surgical History   Procedure Date   ??? Hx orthopaedic      knee replacement   ??? Pr vascular surgery procedure unlist      arterial bypass   ??? Hx pacemaker 2.24.2011     [employer] Jude lead, model number 1488TC, serial number [id number]   ??? Pr cardiac surg procedure unlist      cardiac stent   ??? Hx cataract removal      removed rt eye     No Known Allergies  Family History   Problem Relation Age of Onset   ??? Hypertension     ??? Cancer Mother      cervical and colon   ??? Cancer Father      lung      History   Substance Use Topics   ??? Smoking status: Former Smoker -- 20 years     Types: Cigarettes     Quit date: 11/09/1978   ??? Smokeless tobacco: Never Used   ??? Alcohol Use: No          Medications before admission:  Previous Medications    ALBUTEROL (PROVENTIL HFA, VENTOLIN HFA) 90 MCG/ACTUATION INHALER    Take 2 Puffs by inhalation every six (6) hours as needed for Wheezing.    AMLODIPINE (NORVASC) 5 MG TABLET    Take 1 Tab by mouth daily.    AMMONIUM LACTATE (LAC-HYDRIN) 12 % LOTION    Please apply to affected area two times daily (L lower leg)    ASPIRIN 81 MG TABLET    Take  by mouth daily.    ATORVASTATIN (LIPITOR) 40 MG TABLET    Take 1 Tab by mouth nightly.    BUMETANIDE (BUMEX) 2 MG TABLET  Take 0.5 Tabs by mouth daily.    CLOPIDOGREL (PLAVIX) 75 MG TABLET    Take 1 Tab by mouth daily.    DONEPEZIL (ARICEPT) 5 MG TABLET    Take 1 Tab by mouth nightly.    FAMOTIDINE (PEPCID) 20 MG TABLET    Take 1 Tab by mouth daily.    FERROUS SULFATE (SLOW  FE) 47.5 MG IRON TBER TABLET    Take 1 Tab by mouth two (2) times a day.    FINASTERIDE (PROSCAR) 5 MG TABLET    Take 1 Tab by mouth daily.    FISH OIL-OMEGA-3 FATTY ACIDS 340-1,000 MG CAPSULE    Take 1 Cap by mouth two (2) times a day.    INSULIN ASPART (NOVOLOG) 100 UNIT/ML INJECTION    by SubCUTAneous route Before breakfast and dinner.      INSULIN GLARGINE (LANTUS) 100 UNIT/ML INJECTION    30 Units by SubCUTAneous route daily.    ISOSORBIDE MONONITRATE ER (IMDUR) 30 MG TABLET    Take 1 Tab by mouth daily.    LEVOTHYROXINE (SYNTHROID) 150 MCG TABLET    Take  by mouth daily (before breakfast).    METOPROLOL (LOPRESSOR) 100 MG TABLET    Take 1 Tab by mouth two (2) times a day.    MISOPROSTOL (CYTOTEC) 200 MCG TABLET    Take 1 Tab by mouth two (2) times a day.    NITROGLYCERIN (NITROSTAT) 0.4 MG SL TABLET    by SubLINGual route every five (5) minutes as needed.    OXYCODONE-ACETAMINOPHEN (PERCOCET) 5-325 MG PER TABLET    Take 1 Tab by mouth every four (4) hours as needed for Pain.    POTASSIUM CHLORIDE (K-DUR, KLOR-CON) 10 MEQ TABLET    Take 1 Tab by mouth daily.    RANOLAZINE ER (RANEXA) 500 MG SR TABLET    Take 1 Tab by mouth two (2) times a day.    TERAZOSIN (HYTRIN) 5 MG CAPSULE    Take 10 mg by mouth nightly.      Current Facility-Administered Medications   Medication Dose Route Frequency   ??? aspirin chewable tablet 81 mg  81 mg Oral DAILY   ??? levothyroxine (SYNTHROID) tablet 150 mcg  150 mcg Oral ACB   ??? nitroglycerin (NITROSTAT) tablet 0.4 mg  0.4 mg SubLINGual Q5MIN PRN   ??? terazosin (HYTRIN) capsule 10 mg  10 mg Oral QHS   ??? misoprostol (CYTOTEC) tablet 200 mcg  200 mcg Oral BID   ??? clopidogrel (PLAVIX) tablet 75 mg  75 mg Oral DAILY   ??? metoprolol (LOPRESSOR) tablet 100 mg  100 mg Oral BID   ??? atorvastatin (LIPITOR) tablet 40 mg  40 mg Oral QHS   ??? isosorbide mononitrate ER (IMDUR) tablet 30 mg  30 mg Oral DAILY   ??? fish oil-omega-3 fatty acids 340-1,000 mg capsule 1 Cap  1 Cap Oral BID   ??? bumetanide  (BUMEX) tablet 1 mg  1 mg Oral DAILY   ??? amLODIPine (NORVASC) tablet 5 mg  5 mg Oral DAILY   ??? donepezil (ARICEPT) tablet 5 mg  5 mg Oral QHS   ??? famotidine (PEPCID) tablet 20 mg  20 mg Oral DAILY   ??? finasteride (PROSCAR) tablet 5 mg  5 mg Oral DAILY   ??? insulin glargine (LANTUS) injection 30 Units  30 Units SubCUTAneous DAILY   ??? acetaminophen (TYLENOL) tablet 650 mg  650 mg Oral Q4H PRN   ??? oxyCODONE-acetaminophen (PERCOCET) 5-325 mg per tablet  1 Tab  1 Tab Oral Q4H PRN   ??? morphine injection 1 mg  1 mg IntraVENous Q3H PRN   ??? naloxone (NARCAN) injection 0.4 mg  0.4 mg IntraVENous PRN   ??? diphenhydrAMINE (BENADRYL) injection 12.5 mg  12.5 mg IntraVENous Q4H PRN   ??? ondansetron (ZOFRAN) injection 4 mg  4 mg IntraVENous Q4H PRN   ??? prochlorperazine (COMPAZINE) injection 5 mg  5 mg IntraVENous Q8H PRN   ??? 0.9% sodium chloride infusion  50 mL/hr IntraVENous CONTINUOUS   ??? heparin (porcine) injection 5,000 Units  5,000 Units SubCUTAneous Q8H   ??? insulin lispro (HUMALOG) injection   SubCUTAneous AC&HS   ??? glucose chewable tablet 16 g  4 Tab Oral PRN   ??? dextrose (D50W) injection Syrg 12.5-25 g  12.5-25 g IntraVENous PRN   ??? glucagon (GLUCAGEN) injection 1 mg  1 mg IntraMUSCular PRN   ??? vancomycin (VANCOCIN) 2 g in 0.9% sodium chloride 500 mL IVPB  2,000 mg IntraVENous ONCE   ??? Vancomycin dosing per levels  1 Each Other PRN   ??? piperacillin-tazobactam (ZOSYN) 3.375 g in 0.9% sodium chloride (MBP/ADV) 100 mL MBP  3.375 g IntraVENous Q8H   ??? ranolazine ER (RANEXA) tablet 500 mg  500 mg Oral BID   ??? ferrous sulfate tablet 325 mg  1 Tab Oral BID WITH MEALS   ??? insulin lispro (HUMALOG) injection 12 Units  12 Units SubCUTAneous ONCE   ??? DISCONTD: cefepime (MAXIPIME) 2 g in 0.9% sodium chloride (MBP/ADV) 100 mL MBP  2 g IntraVENous Q12H   ??? DISCONTD: vancomycin (VANCOCIN) 1,000 mg in 0.9% sodium chloride (MBP/ADV) 250 mL adv  1 g IntraVENous NOW   ??? DISCONTD: 0.9% sodium chloride infusion  150 mL/hr IntraVENous CONTINUOUS   ???  DISCONTD: insulin aspart (NOVOLOG) injection   SubCUTAneous ACB&D   ??? DISCONTD: .PHARMACY TO SUBSTITUTE PER PROTOCOL       ??? DISCONTD: .PHARMACY TO SUBSTITUTE PER PROTOCOL       ??? DISCONTD: piperacillin-tazobactam (ZOSYN) 3.375 g in 0.9% sodium chloride (MBP/ADV) 100 mL MBP  3.375 g IntraVENous Q6H   ??? DISCONTD: cefepime (MAXIPIME) 2 g in 0.9% sodium chloride (MBP/ADV) 100 mL MBP  2 g IntraVENous Q24H     Current Outpatient Prescriptions   Medication Sig Dispense   ??? ammonium lactate (LAC-HYDRIN) 12 % lotion Please apply to affected area two times daily (L lower leg)  1 Bottle   ??? bumetanide (BUMEX) 2 mg tablet Take 0.5 Tabs by mouth daily.  30 Tab   ??? amLODIPine (NORVASC) 5 mg tablet Take 1 Tab by mouth daily.  30 Tab   ??? donepezil (ARICEPT) 5 mg tablet Take 1 Tab by mouth nightly.  30 Tab   ??? famotidine (PEPCID) 20 mg tablet Take 1 Tab by mouth daily.  30 Tab   ??? finasteride (PROSCAR) 5 mg tablet Take 1 Tab by mouth daily.  30 Tab   ??? ranolazine ER (RANEXA) 500 mg SR tablet Take 1 Tab by mouth two (2) times a day.  60 Tab   ??? insulin glargine (LANTUS) 100 unit/mL injection 30 Units by SubCUTAneous route daily.  1 Vial   ??? oxyCODONE-acetaminophen (PERCOCET) 5-325 mg per tablet Take 1 Tab by mouth every four (4) hours as needed for Pain.  30 Tab   ??? fish oil-omega-3 fatty acids 340-1,000 mg capsule Take 1 Cap by mouth two (2) times a day.  60 Cap   ??? metoprolol (LOPRESSOR) 100 mg tablet Take  1 Tab by mouth two (2) times a day.  60 Tab   ??? atorvastatin (LIPITOR) 40 mg tablet Take 1 Tab by mouth nightly.  30 Tab   ??? isosorbide mononitrate ER (IMDUR) 30 mg tablet Take 1 Tab by mouth daily.  30 Tab   ??? Ferrous Sulfate (SLOW FE) 47.5 mg iron TbER tablet Take 1 Tab by mouth two (2) times a day.     ??? clopidogrel (PLAVIX) 75 mg tablet Take 1 Tab by mouth daily.  30 Tab   ??? albuterol (PROVENTIL HFA, VENTOLIN HFA) 90 mcg/actuation inhaler Take 2 Puffs by inhalation every six (6) hours as needed for Wheezing.  1 Inhaler   ???  potassium chloride (K-DUR, KLOR-CON) 10 mEq tablet Take 1 Tab by mouth daily.  30 Tab   ??? insulin aspart (NOVOLOG) 100 unit/mL injection by SubCUTAneous route Before breakfast and dinner.       ??? misoprostol (CYTOTEC) 200 mcg tablet Take 1 Tab by mouth two (2) times a day.  60 Tab   ??? terazosin (HYTRIN) 5 mg capsule Take 10 mg by mouth nightly.     ??? aspirin 81 mg tablet Take  by mouth daily.     ??? levothyroxine (SYNTHROID) 150 mcg tablet Take  by mouth daily (before breakfast).     ??? nitroglycerin (NITROSTAT) 0.4 mg SL tablet by SubLINGual route every five (5) minutes as needed.           Review of Systems:  (bold if positive, if negative)    Gen: fatigueEyes:  ENT:  CVS:  Pulm:  dyspnea wheezingGI:    GU:    MS:  Pain, arthritisHem:  Neuro:        Physical Exam:  Visit Vitals   Item Reading   ??? BP 136/56   ??? Pulse 70   ??? Temp 98.3 ??F (36.8 ??C)   ??? Resp 23   ??? Ht 5\' 6"  (1.676 m)   ??? Wt 177 lb (80.287 kg)   ??? BMI 28.57 kg/m2   ??? SpO2 95%         Telemetry:     Gen: Well-developed, well-nourished, in no acute distress  HEENT:  Pale conjunctivae, hearing intact to voice, moist mucous membranes  Neck: Supple,No JVD, No Carotid Bruit, Thyroid- non tender  Resp: No accessory muscle use, Bilat rhonchi+  Card: Regular Rate,Rythm,Normal S1, S2, 2/6 sys murmur+,No thrills.   Abd:  Soft, non-tender, non-distended, normoactive bowel sounds are present,   MSK:L foot - infected wound - toe/forefoot  Skin - dry LE  Psych:  Good insight, oriented to person, place , alert, Nml Affect  LE: No edema  Vascular:Distal Pulses 2+ and symmetric        EKG: SR/inflat STT changes - improved compared to prior EKG/Non specific IVCD      Cxray:    LABS:        Lab Results   Component Value Date/Time    WBC 5.2 10/02/2011  2:24 AM    Hemoglobin (POC) 8.2 10/01/2011  6:48 PM    HGB 7.2 10/02/2011  2:24 AM    Hematocrit (POC) 24 10/01/2011  6:48 PM    HCT 21.5 10/02/2011  2:24 AM    PLATELET 167 10/02/2011  2:24 AM    MCV 84.6 10/02/2011  2:24 AM     Lab  Results   Component Value Date/Time    Sodium 134 10/02/2011  2:24 AM    Potassium 4.5 10/02/2011  2:24  AM    Chloride 100 10/02/2011  2:24 AM    CO2 28 10/02/2011  2:24 AM    Anion gap 6 10/02/2011  2:24 AM    Glucose 277 10/02/2011  2:24 AM    BUN 35 10/02/2011  2:24 AM    Creatinine 2.28 10/02/2011  2:24 AM    BUN/Creatinine ratio 15 10/02/2011  2:24 AM    GFR est non-AA 28 10/02/2011  2:24 AM    Calcium 7.6 10/02/2011  2:24 AM    GFR est AA 34 10/02/2011  2:24 AM     Lab Results   Component Value Date/Time    CK 338 08/23/2011  6:03 AM    CK - MB 15.3 08/23/2011  6:03 AM    CK-MB Index 4.5 08/23/2011  6:03 AM    Troponin-I, Qt. 6.44 08/23/2011  6:03 AM    BNP 102 11/02/2007  4:10 AM     Lab Results   Component Value Date/Time    aPTT 35.2 07/22/2011  4:30 AM     Lab Results   Component Value Date/Time    INR 1.1 05/20/2011  8:20 AM    INR 1.2 05/17/2011  2:22 AM    INR 1.2 10/17/2009  4:18 AM    Prothrombin time 11.7 05/20/2011  8:20 AM    Prothrombin time 13.0 05/17/2011  2:22 AM    Prothrombin time 11.9 10/17/2009  4:18 AM     Lab Results   Component Value Date/Time    BNP 102 11/02/2007  4:10 AM    NT pro-BNP 6191 07/18/2011  6:45 PM    NT pro-BNP 2115 05/16/2011  4:30 PM    NT pro-BNP 2981 09/29/2009  4:00 PM    NT pro-BNP 2333 08/03/2009 11:45 AM    NT pro-BNP 1868 07/15/2009  1:29 PM         Travis Spackman Thamas Jaegers, MD

## 2011-10-02 NOTE — ED Notes (Signed)
Patient provided with coffee per his request.

## 2011-10-02 NOTE — ED Notes (Signed)
TRANSFER - OUT REPORT:    Verbal report given to Morrie Sheldon, RN on Travis [name]  being transferred to [facility] for routine progression of care       Report consisted of patient???s Situation, Background, Assessment and   Recommendations(SBAR).     Information from the following report(s) SBAR, ED Summary, [facility] and Recent Results was reviewed with the receiving nurse.    Opportunity for questions and clarification was provided.

## 2011-10-02 NOTE — Progress Notes (Signed)
10/02/11   CARE MANAGEMENT NOTE:  CM is following pt in the ER for readmission.  EMR reviewed.  Per hx pt was hospitalized at [facility] from 08/21/11 to 09/03/11 with dx of chest pain.  (While in the ER at that admission, pt initially declined treatment but reconsidered).  Today, pt presents to the ER with a diabetic foot infection.  He will be admitted for further medical management.  Reportedly, pt resides alone in a two story home with bedroom on the second floor.  PTA, pt has been ambulatory with a cane, and indepn with ADLs.  He relies on his dtr for transportation.  Pt is retired and has RX drug coverage - he takes medications as directed.  He is currently receiving home health svs from [facility].  DME in the home includes a cane, rolling walker, and oxygen from [facility].  PCP is Dr. Valentino Saxon at the [facility] of the [facility].  During this assessment pt was somewhat resistant to answer my questions stating that he had already addressed these same questions.  Also, he told the charge nurse that the doctor had told him that he might die thus impacting his overall mood and demeanor in the ER.  D.Wegner

## 2011-10-02 NOTE — Progress Notes (Signed)
Patient arrived to [facility] via stretcher from [facility], Telemetry box # [id number] placed on patient, SR in 90s,vss, on 2 LPM NC requesting nebulizer treatment, RT notified, Crackles in bases. Nurse assessed Left Great Toe, per patient's request, gauze was taped over wound for comfort. Will continue to monitor patient.

## 2011-10-02 NOTE — Consults (Signed)
Assessment & Plan:   1. AKI: Unclear etiology  ?? ?IVVD due to Bumex  ?? ?AIN due to some out pt ABx  ?? ?Post/peri infectious GN  ?? New problem and requires further eval  ?? Get UA,urine lytes  ?? Get ZOX:WRUE admission he required Foley due to BOO  ?? Agree to hold Bumex  ?? Give gentle IVF  ?? Daily lytes  ?? DW him about RRT: He in the past had decline HD but today he is not sure about that decision  2. CKD 3  3. Left foot AV:[id number] and ortho following  ?? Monitor for AKI post surgery:as it looks like he will need surgery  4. PAD/PVD  5. DM  6. HTN  7. CHF  8. CAD          Subjective:   CHIEF COMPLAIN: left foot infection  HPI:  71 yrs old Travis [name] who sees Dr.Glenn in CKD program whom we have seen last time in hospital in 4/13 is here with left foot infection.  Ct without contrast showed OM.  Vascular and Ortho are evaluating pt.  He has AKI,New,started in last few days,had cr of 1.4 mg% in 4/13,worsening since then,associated with mild hyperkalemia and hyponatremia AND In the back ground of Bumex use and severe CAD,CHF,PAD.    I rev old chart.  He has underlying CAD ,with recurrent AKIs and associated with Anemia requiring blood and Epo.  He has h/o BOO requiring foley last time.  Cr was better with foley.  He was on Hytrin,proscar.  He has severe CAD.  He has severe PAD.  He has been seen by Wound clinic in [facility] and was sent here.  He says that he was given some ABx but he does not knwo the name.  HTN is well controlled.  He sometimes has dizziness.  He denies problem passing urine.    Patient denies any Chest pain/palpitations.  Patient denies any shortness of breath.  Pt denies any nausea,vomiting,diarrhea.  Patient denies any skin rash.        Review of Systems  Pertinent items are noted in HPI.  All other 10 ROS negative.  Past Medical History   Diagnosis Date   ??? CAD (coronary artery disease)      see problem list   ??? HTN (hypertension)    ??? PVD (peripheral vascular disease)    ??? Hyperlipidemia    ???  Sleep apnea      on CPAP   ??? COPD (chronic obstructive pulmonary disease)      mild COPD, recurrent pneumonia, chronic bronchitis (followed by Dr. Leroy Libman)   ??? CKD (chronic kidney disease)      Cr 1.7 in 10/09   ??? CHF (congestive heart failure)    ??? Anemia    ??? Pulmonary hypertension      PASP 60 on echo 06/22/09   ??? Pneumonia    ??? GERD (gastroesophageal reflux disease)    ??? Chronic pain      back pain   ??? Cataract    ??? GI bleed      multiple bleeding episodes and blood transfusions.  Dr. Monika Salk has followed..  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   ??? Foot ulcer 07/18/2011   ??? Osteomyelitis    ??? Hypothyroidism    ??? Hx of thromboembolism      leg   ??? DM type 2 causing renal disease    ??? DM foot ulcer       Past Surgical  History   Procedure Date   ??? Hx orthopaedic      knee replacement   ??? Pr vascular surgery procedure unlist      arterial bypass   ??? Hx pacemaker 2.24.2011     [employer] Jude lead, model number 1488TC, serial number [id number]   ??? Pr cardiac surg procedure unlist      cardiac stent   ??? Hx cataract removal      removed rt eye       History     Social History   ??? Marital Status: SINGLE     Spouse Name: N/A     Number of Children: N/A   ??? Years of Education: N/A     Occupational History   ??? Not on file.     Social History Main Topics   ??? Smoking status: Former Smoker -- 20 years     Types: Cigarettes     Quit date: 11/09/1978   ??? Smokeless tobacco: Never Used   ??? Alcohol Use: No   ??? Drug Use: No   ??? Sexually Active: Not on file     Other Topics Concern   ??? Not on file     Social History Narrative   ??? No narrative on file      Family History   Problem Relation Age of Onset   ??? Hypertension     ??? Cancer Mother      cervical and colon   ??? Cancer Father      lung      Prior to Admission medications    Medication Sig Start Date End Date Taking? Authorizing Provider   ammonium lactate (LAC-HYDRIN) 12 % lotion Please apply to affected area two times daily (L lower leg) 09/03/11   Gaspar Bidding, MD   bumetanide (BUMEX) 2 mg tablet  Take 0.5 Tabs by mouth daily. 09/02/11   Gaspar Bidding, MD   amLODIPine (NORVASC) 5 mg tablet Take 1 Tab by mouth daily. 09/02/11   Gaspar Bidding, MD   donepezil (ARICEPT) 5 mg tablet Take 1 Tab by mouth nightly. 09/02/11   Gaspar Bidding, MD   famotidine (PEPCID) 20 mg tablet Take 1 Tab by mouth daily. 09/02/11   Gaspar Bidding, MD   finasteride (PROSCAR) 5 mg tablet Take 1 Tab by mouth daily. 09/02/11   Gaspar Bidding, MD   ranolazine ER (RANEXA) 500 mg SR tablet Take 1 Tab by mouth two (2) times a day. 09/02/11   Gaspar Bidding, MD   insulin glargine (LANTUS) 100 unit/mL injection 30 Units by SubCUTAneous route daily. 09/02/11   Gaspar Bidding, MD   oxyCODONE-acetaminophen (PERCOCET) 5-325 mg per tablet Take 1 Tab by mouth every four (4) hours as needed for Pain. 09/02/11   Gaspar Bidding, MD   fish oil-omega-3 fatty acids 340-1,000 mg capsule Take 1 Cap by mouth two (2) times a day. 07/24/11   Gaspar Bidding, MD   metoprolol (LOPRESSOR) 100 mg tablet Take 1 Tab by mouth two (2) times a day. 07/23/11   Sherin Quarry, ACNP   atorvastatin (LIPITOR) 40 mg tablet Take 1 Tab by mouth nightly. 07/23/11   Sherin Quarry, ACNP   isosorbide mononitrate ER (IMDUR) 30 mg tablet Take 1 Tab by mouth daily. 07/23/11   Sherin Quarry, ACNP   Ferrous Sulfate (SLOW FE) 47.5 mg iron TbER tablet Take 1 Tab by mouth two (2) times a day.  Historical Provider   clopidogrel (PLAVIX) 75 mg tablet Take 1 Tab by mouth daily. 06/25/11   Laddie Aquas, MD   albuterol (PROVENTIL HFA, VENTOLIN HFA) 90 mcg/actuation inhaler Take 2 Puffs by inhalation every six (6) hours as needed for Wheezing. 03/11/11   Laddie Aquas, MD   potassium chloride (K-DUR, KLOR-CON) 10 mEq tablet Take 1 Tab by mouth daily. 02/05/11   Laddie Aquas, MD   insulin aspart (NOVOLOG) 100 unit/mL injection by SubCUTAneous route Before breakfast and dinner.      Historical Provider   misoprostol (CYTOTEC) 200 mcg tablet Take 1 Tab by mouth two (2)  times a day. 01/26/10   Laddie Aquas, MD   terazosin (HYTRIN) 5 mg capsule Take 10 mg by mouth nightly.    Historical Provider   aspirin 81 mg tablet Take  by mouth daily.    Historical Provider   levothyroxine (SYNTHROID) 150 mcg tablet Take  by mouth daily (before breakfast).    Historical Provider   nitroglycerin (NITROSTAT) 0.4 mg SL tablet by SubLINGual route every five (5) minutes as needed.    Historical Provider     No Known Allergies    Objective:     Vitals:  Blood pressure 136/56, pulse 70, temperature 98.3 ??F (36.8 ??C), resp. rate 23, height 5\' 6"  (1.676 m), weight 80.287 kg (177 lb), SpO2 95.00%.  Temp (24hrs), Avg:98.3 ??F (36.8 ??C), Min:98.3 ??F (36.8 ??C), Max:98.3 ??F (36.8 ??C)      Intake and Output:     05/06 1900 - 05/08 0659  In: 240 [P.O.:240]  Out: 1300 [Urine:1300]    Physical Exam:                Patient is intubated:  no    Physical Examination:   GENERAL ASSESSMENT: anxious  SKIN: dry skin  HEAD: normocephalic  EYES: normal eyes  NECK: normal  CHEST: normal air exchange, no rales, no rhonchi, no wheezes, respiratory effort normal with no retractions  HEART: regular rate and rhythm, normal S1/S2, systolic murmur - 2/6  ABDOMEN: soft, non-distended, no masses, no hepatosplenomegaly  ZO:XWRUE: no  EXTREMITY: no joint swelling  NEURO: gross motor exam normal by observation      ECG/rhythm:: Rev:yes  Xray/CT/US/MRI REV:yes  Data Review   Recent Results (from the past 72 hour(s))   CBC WITH AUTOMATED DIFF    Collection Time    10/01/11  6:43 PM       Component Value Range    WBC 6.4  4.1 - 11.1 K/uL    RBC 2.78 (*) 4.10 - 5.70 M/uL    HGB 7.8 (*) 12.1 - 17.0 g/dL    HCT 45.4 (*) 09.8 - 50.3 %    MCV 84.5  80.0 - 99.0 FL    MCH 28.1  26.0 - 34.0 PG    MCHC 33.2  30.0 - 36.5 g/dL    RDW 11.9 (*) 14.7 - 14.5 %    PLATELET 184  150 - 400 K/uL    NEUTROPHILS 72  32 - 75 %    LYMPHOCYTES 18  12 - 49 %    MONOCYTES 9  5 - 13 %    EOSINOPHILS 1  0 - 7 %    BASOPHILS 0  0 - 1 %    ABS. NEUTROPHILS 4.6  1.8 - 8.0  K/UL    ABS. LYMPHOCYTES 1.1  0.8 - 3.5 K/UL    ABS. MONOCYTES 0.6  0.0 - 1.0 K/UL  ABS. EOSINOPHILS 0.1  0.0 - 0.4 K/UL    ABS. BASOPHILS 0.0  0.0 - 0.1 K/UL   POC CHEM8    Collection Time    10/01/11  6:48 PM       Component Value Range    Calcium, ionized (POC) 1.03 (*) 1.12 - 1.32 MMOL/L    Sodium (POC) 134 (*) 136 - 145 MMOL/L    Potassium (POC) 5.2 (*) 3.5 - 5.1 MMOL/L    Chloride (POC) 99  98 - 107 MMOL/L    CO2 (POC) 23  21 - 32 MMOL/L    Anion gap (POC) 17 (*) 5 - 15 mmol/L    Glucose (POC) 419 (*) 75 - 110 MG/DL    BUN (POC) 34 (*) 9 - 20 MG/DL    Creatinine (POC) 2.3 (*) 0.6 - 1.3 MG/DL    GFR-AA (POC) 34 (*) >60 ml/min/1.82m2    GFR, non-AA (POC) 28 (*) >60 ml/min/1.68m2    Hemoglobin (POC) 8.2 (*) 12.1 - 17.0 GM/DL    Hematocrit (POC) 24 (*) 36.6 - 50.3 %    Comment Comment Not Indicated.     GLUCOSE, POC    Collection Time    10/01/11 10:27 PM       Component Value Range    POC GLUCOSE 488 (*) 75 - 110 mg/dL    Performed by Iva Lento     CBC WITH AUTOMATED DIFF    Collection Time    10/02/11  2:24 AM       Component Value Range    WBC 5.2  4.1 - 11.1 K/uL    RBC 2.54 (*) 4.10 - 5.70 M/uL    HGB 7.2 (*) 12.1 - 17.0 g/dL    HCT 78.2 (*) 95.6 - 50.3 %    MCV 84.6  80.0 - 99.0 FL    MCH 28.3  26.0 - 34.0 PG    MCHC 33.5  30.0 - 36.5 g/dL    RDW 21.3 (*) 08.6 - 14.5 %    PLATELET 167  150 - 400 K/uL    NEUTROPHILS 69  32 - 75 %    LYMPHOCYTES 17  12 - 49 %    MONOCYTES 13  5 - 13 %    EOSINOPHILS 1  0 - 7 %    BASOPHILS 0  0 - 1 %    ABS. NEUTROPHILS 3.6  1.8 - 8.0 K/UL    ABS. LYMPHOCYTES 0.9  0.8 - 3.5 K/UL    ABS. MONOCYTES 0.7  0.0 - 1.0 K/UL    ABS. EOSINOPHILS 0.0  0.0 - 0.4 K/UL    ABS. BASOPHILS 0.0  0.0 - 0.1 K/UL   MAGNESIUM    Collection Time    10/02/11  2:24 AM       Component Value Range    Magnesium 1.7  1.6 - 2.4 MG/DL   METABOLIC PANEL, COMPREHENSIVE    Collection Time    10/02/11  2:24 AM       Component Value Range    Sodium 134 (*) 136 - 145 MMOL/L    Potassium 4.5  3.5 - 5.1 MMOL/L     Chloride 100  97 - 108 MMOL/L    CO2 28  21 - 32 MMOL/L    Anion gap 6  5 - 15 mmol/L    Glucose 277 (*) 65 - 100 MG/DL    BUN 35 (*) 6 - 20 MG/DL    Creatinine 5.78 (*) 0.45 -  1.15 MG/DL    BUN/Creatinine ratio 15  12 - 20      GFR est AA 34 (*) >60 ml/min/1.26m2    GFR est non-AA 28 (*) >60 ml/min/1.84m2    Calcium 7.6 (*) 8.5 - 10.1 MG/DL    Bilirubin, total 0.5  0.2 - 1.0 MG/DL    ALT 17  12 - 78 U/L    AST 16  15 - 37 U/L    Alk. phosphatase 99  50 - 136 U/L    Protein, total 7.3  6.4 - 8.2 g/dL    Albumin 2.2 (*) 3.5 - 5.0 g/dL    Globulin 5.1 (*) 2.0 - 4.0 g/dL    A-G Ratio 0.4 (*) 1.1 - 2.2     PHOSPHORUS    Collection Time    10/02/11  2:24 AM       Component Value Range    Phosphorus 2.9  2.5 - 4.9 MG/DL   GLUCOSE, POC    Collection Time    10/02/11  8:32 AM       Component Value Range    POC GLUCOSE 188 (*) 75 - 110 mg/dL    Performed by Bradly Bienenstock         Discussed with:    Patient and Attending/Consulting  Thank you so much to allow [name] to participate in this patient's care.We will follow.    [facility]  [address]  [place], [place] [address]  Phone - [phone]  Fax - [phone]

## 2011-10-02 NOTE — Progress Notes (Signed)
Medical Progress Note      NAME: Travis [name]   DOB:  Mar 15, 1941  MRM:  [id number]    Date/Time: 10/02/2011        Principal Problem:   *Osteomyelitis ()  Active Problems:     CAD (coronary artery disease) ()   Cath 1999 distal disease in the apical LAD and right coronary artery with    a normal ejection fraction.   Non q MI in 7/06.cath - mod LAD abn RCA disease up to 50-60% , small    occluded LCX with normal LVEF.     (?NSTEMI) during admission with pneumonia and ARF 05/2009 - proceeded to    have a PCI   (DES) to RCA.     NSTEMI (trop ~30) on 06/21/09 but cath showed stable findings.     NSTEMI with another cath in 2/11 - the stent was patent and no new    findings.     Multiple NSTEMI's in 5/11 - proceeded on 10/16/09 to PCI of 85% ulcerated    plaque mid RCA successfully stented with 2.75 Xience.  CTO Cx successful    PTCA and DES of proximal part with 2.25 Atom.            Hyperlipidemia (10/06/2009)     HTN (hypertension) (10/13/2009)     PAD (peripheral artery disease) (05/18/2011)   PVR tracing 05/18/2011 suggest bilateral SFA occlusion and calf vessel    disease    and high grade stenosis of his proximal fem-pop   Angio March 2012 ([facility]): r sfa-ak pop with prox stenosis, 1 vs r/o; left    diffuse sfa , severe tpt disease     CKD (chronic kidney disease) stage 3, GFR 30-59 ml/min (07/18/2011)     Foot ulcer (07/18/2011)     Anemia of other chronic disease (08/21/2011)     SSS (sick sinus syndrome) (08/27/2011)   07/20/09: RV: [employer] Jude lead, model number 2088, serial number [id number],    RA: [employer] Jude lead, model number 1488TC, serial number [id number],      Generator by [employer], model number 5826, serial number [id number],             Dementia (08/31/2011)     Chronic pain ()   back pain     GERD (gastroesophageal reflux disease) ()     Hypothyroidism ()     Hx of thromboembolism ()   leg     Pulmonary hypertension ()   PASP 60 on echo 06/22/09     CHF (congestive heart failure) ()     COPD (chronic obstructive  pulmonary disease) ()   mild COPD, recurrent pneumonia, chronic bronchitis (followed by Dr. Leroy Libman)     Sleep apnea ()   on CPAP     PVD (peripheral vascular disease) ()     Hyponatremia (10/01/2011)     Hyperkalemia (10/01/2011)     ARF (acute renal failure) (10/01/2011)        Assessment/Plan:   1.  Osteomyelitis / Foot ulcer - Start empiric vanco and zosyn. Culture and MRSA screen. Vascular evaluation appreciated. Possible amputation.     2.  PAD (peripheral artery disease) / PVD (peripheral vascular disease) - Severe. Vascular evaluation appreciated.     3.  CAD (coronary artery disease) / CHF (congestive heart failure) - Multiple STEMIs and stents. Consult cardiology to clear him for likley surgery. ON Plavix and ASA (although may have to hold those prior to any surgery), Ranexa, BB,  IMDUR     4.  Acute on chronic renal failure. Worse due to volume depletion/ bumex. Renal evaluation appreciated. Gentle hydration with NS due to CHF. Hold bumex now.     5.  Dm with renal complications / DM foot ulcer / Uncontrolled.- Resume lantus, DM diet , SSI per protocol.     6.  Anemia of other chronic disease - follow. Transfuse 2 untis of PRBC. EPO per renal. Continue PO iron.     7.  SSS (sick sinus syndrome) - Pacer placed.  Follow on tele.     8.  HTN (hypertension) - Continue norvasc, metoprolol     9.  Dementia - Stable and mild. Continue aricept.     10.  GERD (gastroesophageal reflux disease) - Continue PPI      11.  Hypothyroidism - Continue synthroid     12.  Hx of thromboembolism - leg. Monitor. May need heparin, but await vascular opinion.     13.  COPD (chronic obstructive pulmonary disease) - mild COPD, Hx recurrent pneumonia, chronic bronchitis (followed by Dr. Leroy Libman). Albuterol PRN    14.  Sleep apnea - on CPAP     15.  Hyperlipidemia - Continue fish oil and lipitor     16.  BPH- Continue Proscar and terazosin             Subjective:     Chief Complaint:  Foot pain    ROS:  (bold if positive, if negative)       Tolerating PT  Tolerating Diet          Objective:       Vitals:          Last 24hrs VS reviewed since prior progress note. Most recent are:    Visit Vitals   Item Reading   ??? BP 146/63   ??? Pulse 84   ??? Temp 98.3 ??F (36.8 ??C)   ??? Resp 19   ??? Ht 5\' 6"  (1.676 m)   ??? Wt 177 lb   ??? BMI 28.57 kg/m2   ??? SpO2 94%     SpO2 Readings from Last 6 Encounters:   10/02/11 94%   09/03/11 95%   08/13/11 99%   08/13/11 99%   07/24/11 98%   05/25/11 97%    O2 Flow Rate (L/min): 2 l/min     Intake/Output Summary (Last 24 hours) at 10/02/11 1052  Last data filed at 10/02/11 0616   Gross per 24 hour   Intake    240 ml   Output   1300 ml   Net  -1060 ml          Exam:     Physical Exam:    Gen:  Well-developed, well-nourished, in no acute distress  HEENT:  Pink conjunctivae, PERRL, hearing intact to voice, moist mucous membranes  Neck:  Supple, without masses, thyroid non-tender  Resp:  No accessory muscle use, clear breath sounds without wheezes rales or rhonchi  Card:  No murmurs, normal S1, S2 without thrills, bruits or peripheral edema  Abd:  Soft, non-tender, non-distended, normoactive bowel sounds are present, no palpable organomegaly and no detectable hernias  Lymph:  No cervical or inguinal adenopathy  Musc:  No cyanosis or clubbing  Skin:  No rashes or ulcers, skin turgor is good  Neuro:  Cranial nerves are grossly intact, no focal motor weakness, follows commands appropriately  Psych:  Good insight, oriented to person, place and time, alert  Medications Reviewed: (see below)    Lab Data Reviewed: (see below)    ______________________________________________________________________    Medications:     Current Facility-Administered Medications   Medication Dose Route Frequency   ??? epoetin alfa (EPOGEN;PROCRIT) injection 10,000 Units  10,000 Units SubCUTAneous Q7D   ??? albuterol (PROVENTIL VENTOLIN) nebulizer solution 2.5 mg  2.5 mg Nebulization Q2H PRN   ??? aspirin chewable tablet 81 mg  81 mg Oral DAILY   ??? levothyroxine  (SYNTHROID) tablet 150 mcg  150 mcg Oral ACB   ??? nitroglycerin (NITROSTAT) tablet 0.4 mg  0.4 mg SubLINGual Q5MIN PRN   ??? terazosin (HYTRIN) capsule 10 mg  10 mg Oral QHS   ??? misoprostol (CYTOTEC) tablet 200 mcg  200 mcg Oral BID   ??? clopidogrel (PLAVIX) tablet 75 mg  75 mg Oral DAILY   ??? metoprolol (LOPRESSOR) tablet 100 mg  100 mg Oral BID   ??? atorvastatin (LIPITOR) tablet 40 mg  40 mg Oral QHS   ??? isosorbide mononitrate ER (IMDUR) tablet 30 mg  30 mg Oral DAILY   ??? fish oil-omega-3 fatty acids 340-1,000 mg capsule 1 Cap  1 Cap Oral BID   ??? bumetanide (BUMEX) tablet 1 mg  1 mg Oral DAILY   ??? amLODIPine (NORVASC) tablet 5 mg  5 mg Oral DAILY   ??? donepezil (ARICEPT) tablet 5 mg  5 mg Oral QHS   ??? famotidine (PEPCID) tablet 20 mg  20 mg Oral DAILY   ??? finasteride (PROSCAR) tablet 5 mg  5 mg Oral DAILY   ??? insulin glargine (LANTUS) injection 30 Units  30 Units SubCUTAneous DAILY   ??? acetaminophen (TYLENOL) tablet 650 mg  650 mg Oral Q4H PRN   ??? oxyCODONE-acetaminophen (PERCOCET) 5-325 mg per tablet 1 Tab  1 Tab Oral Q4H PRN   ??? morphine injection 1 mg  1 mg IntraVENous Q3H PRN   ??? naloxone (NARCAN) injection 0.4 mg  0.4 mg IntraVENous PRN   ??? diphenhydrAMINE (BENADRYL) injection 12.5 mg  12.5 mg IntraVENous Q4H PRN   ??? ondansetron (ZOFRAN) injection 4 mg  4 mg IntraVENous Q4H PRN   ??? prochlorperazine (COMPAZINE) injection 5 mg  5 mg IntraVENous Q8H PRN   ??? 0.9% sodium chloride infusion  50 mL/hr IntraVENous CONTINUOUS   ??? heparin (porcine) injection 5,000 Units  5,000 Units SubCUTAneous Q8H   ??? insulin lispro (HUMALOG) injection   SubCUTAneous AC&HS   ??? glucose chewable tablet 16 g  4 Tab Oral PRN   ??? dextrose (D50W) injection Syrg 12.5-25 g  12.5-25 g IntraVENous PRN   ??? glucagon (GLUCAGEN) injection 1 mg  1 mg IntraMUSCular PRN   ??? vancomycin (VANCOCIN) 2 g in 0.9% sodium chloride 500 mL IVPB  2,000 mg IntraVENous ONCE   ??? Vancomycin dosing per levels  1 Each Other PRN   ??? piperacillin-tazobactam (ZOSYN) 3.375 g in  0.9% sodium chloride (MBP/ADV) 100 mL MBP  3.375 g IntraVENous Q8H   ??? ranolazine ER (RANEXA) tablet 500 mg  500 mg Oral BID   ??? ferrous sulfate tablet 325 mg  1 Tab Oral BID WITH MEALS   ??? insulin lispro (HUMALOG) injection 12 Units  12 Units SubCUTAneous ONCE     Current Outpatient Prescriptions   Medication Sig   ??? ammonium lactate (LAC-HYDRIN) 12 % lotion Please apply to affected area two times daily (L lower leg)   ??? bumetanide (BUMEX) 2 mg tablet Take 0.5 Tabs by mouth daily.   ??? amLODIPine (NORVASC)  5 mg tablet Take 1 Tab by mouth daily.   ??? donepezil (ARICEPT) 5 mg tablet Take 1 Tab by mouth nightly.   ??? famotidine (PEPCID) 20 mg tablet Take 1 Tab by mouth daily.   ??? finasteride (PROSCAR) 5 mg tablet Take 1 Tab by mouth daily.   ??? ranolazine ER (RANEXA) 500 mg SR tablet Take 1 Tab by mouth two (2) times a day.   ??? insulin glargine (LANTUS) 100 unit/mL injection 30 Units by SubCUTAneous route daily.   ??? oxyCODONE-acetaminophen (PERCOCET) 5-325 mg per tablet Take 1 Tab by mouth every four (4) hours as needed for Pain.   ??? fish oil-omega-3 fatty acids 340-1,000 mg capsule Take 1 Cap by mouth two (2) times a day.   ??? metoprolol (LOPRESSOR) 100 mg tablet Take 1 Tab by mouth two (2) times a day.   ??? atorvastatin (LIPITOR) 40 mg tablet Take 1 Tab by mouth nightly.   ??? isosorbide mononitrate ER (IMDUR) 30 mg tablet Take 1 Tab by mouth daily.   ??? Ferrous Sulfate (SLOW FE) 47.5 mg iron TbER tablet Take 1 Tab by mouth two (2) times a day.   ??? clopidogrel (PLAVIX) 75 mg tablet Take 1 Tab by mouth daily.   ??? albuterol (PROVENTIL HFA, VENTOLIN HFA) 90 mcg/actuation inhaler Take 2 Puffs by inhalation every six (6) hours as needed for Wheezing.   ??? potassium chloride (K-DUR, KLOR-CON) 10 mEq tablet Take 1 Tab by mouth daily.   ??? insulin aspart (NOVOLOG) 100 unit/mL injection by SubCUTAneous route Before breakfast and dinner.     ??? misoprostol (CYTOTEC) 200 mcg tablet Take 1 Tab by mouth two (2) times a day.   ??? terazosin  (HYTRIN) 5 mg capsule Take 10 mg by mouth nightly.   ??? aspirin 81 mg tablet Take  by mouth daily.   ??? levothyroxine (SYNTHROID) 150 mcg tablet Take  by mouth daily (before breakfast).   ??? nitroglycerin (NITROSTAT) 0.4 mg SL tablet by SubLINGual route every five (5) minutes as needed.            Lab Review:     Recent Labs   Basename 10/02/11 0224 10/01/11 1843    WBC 5.2 6.4    HGB 7.2* 7.8*    HCT 21.5* 23.5*    PLT 167 184     Recent Labs   Basename 10/02/11 0224    NA 134*    K 4.5    CL 100    CO2 28    GLU 277*    BUN 35*    CREA 2.28*    CA 7.6*    MG 1.7    PHOS 2.9    ALB 2.2*    TBIL 0.5    SGOT 16    INR --     Lab Results   Component Value Date/Time    POC GLUCOSE 188 10/02/2011  8:32 AM    POC GLUCOSE 488 10/01/2011 10:27 PM    POC GLUCOSE 139 09/03/2011 11:45 AM    POC GLUCOSE 102 09/03/2011  7:49 AM    POC GLUCOSE 99 09/02/2011  8:26 PM     No results found for this basename: PH:3,PCO2:3,PO2:3,HCO3:3,FIO2:3 in the last 72 hours  No results found for this basename: INR:3 in the last 72 hours    Lab Results   Component Value Date/Time    Specimen Description: FOOT 10/01/2011 10:58 PM    Specimen Description: BLOOD 10/01/2011  8:15 PM    Specimen  Description: BLOOD 08/23/2011 10:23 PM     Lab Results   Component Value Date/Time    Culture result: PENDING 10/01/2011 10:58 PM    Culture result: NO GROWTH AFTER 13 HOURS 10/01/2011  8:15 PM    Culture result: NO GROWTH 5 DAYS 08/23/2011 10:23 PM                     Total time spent with patient: 89                  Care Plan discussed with: Patient, Nursing Staff and >50% of time spent in counseling and coordination of care    Discussed:  Care Plan    Prophylaxis:  Hep SQ    Disposition:  SNF/LTC           ___________________________________________________    Attending Physician: Cleotis Nipper, MD

## 2011-10-02 NOTE — Progress Notes (Signed)
Spoke with Dr. Sudie Bailey about holding Heparin SQ secondary to planned surgery in AM. Verbal order given to hold anticoagulant. Also informed MD patient has a temp of 99 at the start of PRBC transfusion. Verbal order given to administer tylenol to premedicate. Reviewed updated plan of care with Jeralyn Bennett RN.

## 2011-10-02 NOTE — Other (Signed)
Cardiopulmonary Rehab:  Past medical history of CHF noted in chart.  Patient has received packets on several previous admissions.  Denies having any questions regarding CHF and did not want additional packet.  Will follow as indicated.

## 2011-10-02 NOTE — ED Notes (Signed)
Pt slept on and off through the night.  Denied any pain.

## 2011-10-02 NOTE — Progress Notes (Addendum)
Pt received from ED.  VSS.  No c/o pain at this time.  Left foot wounds OTA.  Wounds to left great toe on top of toe and on right side.    Pt oriented to unit, room, and routine.  Call bell within reach.  Will continue to monitor closely.      1400  Blood transfusion started.      1655  Transfusion complete      1830  TRANSFER - OUT REPORT:    Verbal report given to Heather, RN (name) on Travis [name]  being transferred to [facility] (unit) for routine progression of care       Report consisted of patient???s Situation, Background, Assessment and   Recommendations(SBAR).     Information from the following report(s) SBAR, Kardex, Intake/Output, MAR and Recent Results was reviewed with the receiving nurse.    Opportunity for questions and clarification was provided.

## 2011-10-03 LAB — METABOLIC PANEL, BASIC
Anion gap: 10 mmol/L (ref 5–15)
BUN/Creatinine ratio: 15 (ref 12–20)
BUN: 26 MG/DL — ABNORMAL HIGH (ref 6–20)
CO2: 26 MMOL/L (ref 21–32)
Calcium: 8.1 MG/DL — ABNORMAL LOW (ref 8.5–10.1)
Chloride: 101 MMOL/L (ref 97–108)
Creatinine: 1.74 MG/DL — ABNORMAL HIGH (ref 0.45–1.15)
GFR est AA: 47 mL/min/{1.73_m2} — ABNORMAL LOW (ref >60)
GFR est non-AA: 39 mL/min/{1.73_m2} — ABNORMAL LOW (ref >60)
Glucose: 76 MG/DL (ref 65–100)
Potassium: 3.8 MMOL/L (ref 3.5–5.1)
Sodium: 137 MMOL/L (ref 136–145)

## 2011-10-03 LAB — IRON PROFILE
Iron % saturation: 47 % (ref 20–50)
Iron: 76 ug/dL (ref 35–150)
TIBC: 163 ug/dL — ABNORMAL LOW (ref 250–450)

## 2011-10-03 LAB — CBC WITH AUTOMATED DIFF
ABS. BASOPHILS: 0 10*3/uL (ref 0.0–0.1)
ABS. EOSINOPHILS: 0.1 10*3/uL (ref 0.0–0.4)
ABS. LYMPHOCYTES: 1 10*3/uL (ref 0.8–3.5)
ABS. MONOCYTES: 0.6 10*3/uL (ref 0.0–1.0)
ABS. NEUTROPHILS: 6 10*3/uL (ref 1.8–8.0)
BASOPHILS: 0 % (ref 0–1)
EOSINOPHILS: 1 % (ref 0–7)
HCT: 29.3 % — ABNORMAL LOW (ref 36.6–50.3)
HGB: 10.1 g/dL — ABNORMAL LOW (ref 12.1–17.0)
LYMPHOCYTES: 13 % (ref 12–49)
MCH: 28.4 PG (ref 26.0–34.0)
MCHC: 34.5 g/dL (ref 30.0–36.5)
MCV: 82.3 FL (ref 80.0–99.0)
MONOCYTES: 8 % (ref 5–13)
NEUTROPHILS: 78 % — ABNORMAL HIGH (ref 32–75)
PLATELET: 141 10*3/uL — ABNORMAL LOW (ref 150–400)
RBC: 3.56 M/uL — ABNORMAL LOW (ref 4.10–5.70)
RDW: 17.5 % — ABNORMAL HIGH (ref 11.5–14.5)
WBC: 7.8 10*3/uL (ref 4.1–11.1)

## 2011-10-03 LAB — GLUCOSE, POC
Glucose (POC): 307 mg/dL — ABNORMAL HIGH (ref 75–110)
Glucose (POC): 79 mg/dL (ref 75–110)
Glucose (POC): 83 mg/dL (ref 75–110)
Glucose (POC): 141 mg/dL — ABNORMAL HIGH (ref 75–110)
Glucose (POC): 112 mg/dL — ABNORMAL HIGH (ref 75–110)
Glucose (POC): 115 mg/dL — ABNORMAL HIGH (ref 75–110)

## 2011-10-03 LAB — TYPE + CROSSMATCH
ABO/Rh(D): O POS
Antibody screen: NEGATIVE
Status of unit: TRANSFUSED
Status of unit: TRANSFUSED
Unit division: 0
Unit division: 0

## 2011-10-03 LAB — EKG, 12 LEAD, INITIAL
Atrial Rate: 67 {beats}/min
Calculated P Axis: 47 degrees
Calculated R Axis: 84 degrees
Calculated T Axis: -26 degrees
Diagnosis: NORMAL
P-R Interval: 194 ms
Q-T Interval: 470 ms
QRS Duration: 124 ms
QTC Calculation (Bezet): 496 ms
Ventricular Rate: 67 {beats}/min

## 2011-10-03 LAB — VANCOMYCIN, RANDOM: Vancomycin, random: 15.3 UG/ML

## 2011-10-03 LAB — CULTURE, MRSA

## 2011-10-03 MED ORDER — PNEUMOCOCCAL 23-VALPS VACCINE 25 MCG/0.5 ML INJECTION
25 mcg/0.5 mL | INTRAMUSCULAR | Status: DC
Start: 2011-10-03 — End: 2011-10-09

## 2011-10-03 MED ORDER — BACITRACIN 50,000 UNIT IM
50000 unit | INTRAMUSCULAR | Status: DC | PRN
Start: 2011-10-03 — End: 2011-10-03
  Administered 2011-10-03: 19:00:00

## 2011-10-03 MED ORDER — BACITRACIN 50,000 UNIT IM
50000 unit | INTRAMUSCULAR | Status: AC
Start: 2011-10-03 — End: ?

## 2011-10-03 MED ORDER — LACTATED RINGERS IV
INTRAVENOUS | Status: DC
Start: 2011-10-03 — End: 2011-10-03
  Administered 2011-10-03: 20:00:00 via INTRAVENOUS

## 2011-10-03 MED ORDER — FENTANYL CITRATE (PF) 50 MCG/ML IJ SOLN
50 mcg/mL | INTRAMUSCULAR | Status: AC
Start: 2011-10-03 — End: ?

## 2011-10-03 MED ORDER — FUROSEMIDE 10 MG/ML IJ SOLN
10 mg/mL | Freq: Once | INTRAMUSCULAR | Status: AC
Start: 2011-10-03 — End: 2011-10-03
  Administered 2011-10-03: 13:00:00 via INTRAVENOUS

## 2011-10-03 MED ORDER — METHYLPREDNISOLONE (PF) 125 MG/2 ML IJ SOLR
125 mg/2 mL | Freq: Four times a day (QID) | INTRAMUSCULAR | Status: DC
Start: 2011-10-03 — End: 2011-10-04
  Administered 2011-10-03 – 2011-10-04 (×4): via INTRAVENOUS

## 2011-10-03 MED ORDER — IPRATROPIUM BROMIDE 0.02 % SOLN FOR INHALATION
0.02 % | RESPIRATORY_TRACT | Status: DC
Start: 2011-10-03 — End: 2011-10-06
  Administered 2011-10-03 – 2011-10-06 (×11): via RESPIRATORY_TRACT

## 2011-10-03 MED ORDER — ONDANSETRON (PF) 4 MG/2 ML INJECTION
4 mg/2 mL | INTRAMUSCULAR | Status: DC | PRN
Start: 2011-10-03 — End: 2011-10-03

## 2011-10-03 MED ORDER — HYDROMORPHONE (PF) 1 MG/ML IJ SOLN
1 mg/mL | INTRAMUSCULAR | Status: DC | PRN
Start: 2011-10-03 — End: 2011-10-03

## 2011-10-03 MED ORDER — MIDAZOLAM 1 MG/ML IJ SOLN
1 mg/mL | INTRAMUSCULAR | Status: AC
Start: 2011-10-03 — End: ?

## 2011-10-03 MED ORDER — FLUTICASONE-SALMETEROL 250 MCG-50 MCG/DOSE DISK DEVICE FOR INHALATION
250-50 mcg/dose | Freq: Two times a day (BID) | RESPIRATORY_TRACT | Status: DC
Start: 2011-10-03 — End: 2011-10-09
  Administered 2011-10-04 – 2011-10-09 (×12): via RESPIRATORY_TRACT

## 2011-10-03 MED ORDER — OXYCODONE-ACETAMINOPHEN 5 MG-325 MG TAB
5-325 mg | ORAL | Status: DC | PRN
Start: 2011-10-03 — End: 2011-10-03

## 2011-10-03 MED ORDER — LACTATED RINGERS IV
INTRAVENOUS | Status: DC
Start: 2011-10-03 — End: 2011-10-03
  Administered 2011-10-03: 19:00:00 via INTRAVENOUS

## 2011-10-03 MED ORDER — MORPHINE 2 MG/ML INJECTION
2 mg/mL | INTRAMUSCULAR | Status: DC | PRN
Start: 2011-10-03 — End: 2011-10-09
  Administered 2011-10-04 – 2011-10-06 (×6): via INTRAVENOUS

## 2011-10-03 MED ORDER — SODIUM CHLORIDE 0.9 % IV
10 gram | INTRAVENOUS | Status: DC
Start: 2011-10-03 — End: 2011-10-08
  Administered 2011-10-03 – 2011-10-07 (×5): via INTRAVENOUS

## 2011-10-03 MED ORDER — LIDOCAINE HCL 1 % (10 MG/ML) IJ SOLN
10 mg/mL (1 %) | INTRAMUSCULAR | Status: AC
Start: 2011-10-03 — End: ?

## 2011-10-03 MED ORDER — LIDOCAINE (PF) 10 MG/ML (1 %) IJ SOLN
10 mg/mL (1 %) | INTRAMUSCULAR | Status: DC | PRN
Start: 2011-10-03 — End: 2011-10-03

## 2011-10-03 MED ORDER — ALBUTEROL SULFATE 0.083 % (0.83 MG/ML) SOLN FOR INHALATION
2.5 mg /3 mL (0.083 %) | RESPIRATORY_TRACT | Status: DC
Start: 2011-10-03 — End: 2011-10-03

## 2011-10-03 MED FILL — AMLODIPINE 5 MG TAB: 5 mg | ORAL | Qty: 1

## 2011-10-03 MED FILL — VANCOMYCIN 10 GRAM IV SOLR: 10 gram | INTRAVENOUS | Qty: 1.25

## 2011-10-03 MED FILL — FENTANYL CITRATE (PF) 50 MCG/ML IJ SOLN: 50 mcg/mL | INTRAMUSCULAR | Qty: 5

## 2011-10-03 MED FILL — IPRATROPIUM BROMIDE 0.02 % SOLN FOR INHALATION: 0.02 % | RESPIRATORY_TRACT | Qty: 2.5

## 2011-10-03 MED FILL — FISH OIL 340 MG-1,000 MG CAPSULE: 340-1000 mg | ORAL | Qty: 1

## 2011-10-03 MED FILL — FERROUS SULFATE 325 MG (65 MG ELEMENTAL IRON) TAB: 325 mg (65 mg iron) | ORAL | Qty: 1

## 2011-10-03 MED FILL — LIPITOR 20 MG TABLET: 20 mg | ORAL | Qty: 2

## 2011-10-03 MED FILL — LACTATED RINGERS IV: INTRAVENOUS | Qty: 1000

## 2011-10-03 MED FILL — INSULIN GLARGINE 100 UNIT/ML INJECTION: 100 unit/mL | SUBCUTANEOUS | Qty: 0.3

## 2011-10-03 MED FILL — ASPIRIN 81 MG CHEWABLE TAB: 81 mg | ORAL | Qty: 1

## 2011-10-03 MED FILL — RANEXA 500 MG TABLET,EXTENDED RELEASE: 500 mg | ORAL | Qty: 1

## 2011-10-03 MED FILL — FAMOTIDINE 20 MG TAB: 20 mg | ORAL | Qty: 1

## 2011-10-03 MED FILL — METOPROLOL TARTRATE 25 MG TAB: 25 mg | ORAL | Qty: 4

## 2011-10-03 MED FILL — ARICEPT 5 MG TABLET: 5 mg | ORAL | Qty: 1

## 2011-10-03 MED FILL — BUMETANIDE 1 MG TAB: 1 mg | ORAL | Qty: 1

## 2011-10-03 MED FILL — TERAZOSIN 5 MG CAP: 5 mg | ORAL | Qty: 2

## 2011-10-03 MED FILL — ISOSORBIDE MONONITRATE SR 30 MG 24 HR TAB: 30 mg | ORAL | Qty: 1

## 2011-10-03 MED FILL — SODIUM CHLORIDE 0.9 % IV PIGGY BACK: INTRAVENOUS | Qty: 100

## 2011-10-03 MED FILL — MISOPROSTOL 200 MCG TAB: 200 mcg | ORAL | Qty: 1

## 2011-10-03 MED FILL — MIDAZOLAM 1 MG/ML IJ SOLN: 1 mg/mL | INTRAMUSCULAR | Qty: 5

## 2011-10-03 MED FILL — ACETAMINOPHEN 325 MG TABLET: 325 mg | ORAL | Qty: 2

## 2011-10-03 MED FILL — HEPARIN (PORCINE) 5,000 UNIT/ML IJ SOLN: 5000 unit/mL | INTRAMUSCULAR | Qty: 1

## 2011-10-03 MED FILL — BACITRACIN 50,000 UNIT IM: 50000 unit | INTRAMUSCULAR | Qty: 50000

## 2011-10-03 MED FILL — FUROSEMIDE 10 MG/ML IJ SOLN: 10 mg/mL | INTRAMUSCULAR | Qty: 4

## 2011-10-03 MED FILL — PLAVIX 75 MG TABLET: 75 mg | ORAL | Qty: 1

## 2011-10-03 MED FILL — LEVOTHYROXINE 150 MCG TAB: 150 mcg | ORAL | Qty: 1

## 2011-10-03 MED FILL — ADVAIR DISKUS 250 MCG-50 MCG/DOSE POWDER FOR INHALATION: 250-50 mcg/dose | RESPIRATORY_TRACT | Qty: 14

## 2011-10-03 MED FILL — FINASTERIDE 5 MG TAB: 5 mg | ORAL | Qty: 1

## 2011-10-03 MED FILL — LIDOCAINE HCL 1 % (10 MG/ML) IJ SOLN: 10 mg/mL (1 %) | INTRAMUSCULAR | Qty: 20

## 2011-10-03 MED FILL — INSULIN LISPRO 100 UNIT/ML INJECTION: 100 unit/mL | SUBCUTANEOUS | Qty: 1

## 2011-10-03 MED FILL — SOLU-MEDROL (PF) 125 MG/2 ML SOLUTION FOR INJECTION: 125 mg/2 mL | INTRAMUSCULAR | Qty: 2

## 2011-10-03 MED FILL — ALBUTEROL SULFATE 0.083 % (0.83 MG/ML) SOLN FOR INHALATION: 2.5 mg /3 mL (0.083 %) | RESPIRATORY_TRACT | Qty: 1

## 2011-10-03 NOTE — Progress Notes (Signed)
Pt having block performed by anesthesia team, VSS, on monitor.

## 2011-10-03 NOTE — Progress Notes (Signed)
NUTRITION    BEST PRACTICE ALERT:   Braden Score: 19      Nutrition: Adequate    Diet: NPO for procedure  Body mass index is 28.57 kg/(m^2).  Skin: Lt foot infection OM - non healing vascular wound for surgery today.   Weight Metrics 10/01/2011 08/31/2011 08/21/2011 08/13/2011 07/23/2011   Weight 177 lb 198 lb 3.2 oz - 184 lb 189 lb   BMI 28.58 kg/m2 - 32.01 kg/m2 29.71 kg/m2 30.52 kg/m2       RD referral received via nsg admission screen for wounds. Pt w/ vascular OM on Lt foot. He reports his appetite has been diminished. He eats about 2 meals/day, he is unable to give me examples of what he eats. Does not snack much. He thinks he has lost weight but is unable to give this hx as well. Per chart review, wt is down ~12# from February. This would indicate at 6% wt loss in 3 months and is moderate-severe.     RD PLAN:   RD will monitor diet advancement post surgery and provide nutritional interventions as appropriate to maximize intake for post op healing.     Curt Jews. Orlene Erm, RD, CNSC

## 2011-10-03 NOTE — Progress Notes (Signed)
Bedside and Verbal shift change report given to Catherine RN (oncoming nurse) by Lakeishia Truluck RN   (offgoing nurse). Report given with SBAR, Kardex, Procedure Summary, MAR and Recent Results.

## 2011-10-03 NOTE — Op Note (Signed)
Name:      Travis [name], Travis [name]                                          Surgeon:        Lenn Sink, MD  Account #: [id number]                 Surgery Date:   10/03/2011  DOB:       11/08/1940  Age:       71                           Location:                                 OPERATIVE REPORT      PREOPERATIVE DIAGNOSIS: Left great toe osteomyelitis.    POSTOPERATIVE DIAGNOSIS: Left great toe osteomyelitis.    PROCEDURE PERFORMED: Ray amputation of left great toe and first  metatarsal.    ESTIMATED BLOOD LOSS:    SPECIMENS REMOVED:    INDICATIONS FOR PROCEDURE: The patient is a 71 year old male with difficult  to control diabetes as well as significant coronary artery disease, who has  undergone 2 arteriograms with revascularization this year to improve the  blood flow in his left leg for rest pain as well as toe gangrene. This  improved his ABIs considerably, and he underwent toe tip amputation about 2  months ago. The patient did well for a while; however, he did develop  proximal osteomyelitis of the left great metatarsal head, and the incision  opened up at the tip, and he developed a new ulcer medially. The plan is  for amputation of the first metatarsal and the great toe.    DESCRIPTION OF PROCEDURE: The patient was placed supine on the operating  table and an ankle block was provided by the anesthesia department. The  left foot was prepped and draped in standard surgical fashion.    A circumferential medially directed incision was made around the previous  toe tip amputation site as well as around the medial portion of the first  metatarsal that had an open wound in it. This was taken down to the bone.  The bone was resected, which involved the proximal phalanx and the first  metatarsal. This was excised and passed off the table as specimen. A bone  rongeur was used to cut back the first metatarsal where it was deep into  the wound. There was some soft tissue coverage obtained by using the  plantar soft  tissue. This was sewn superiorly to create some coverage over  the bone using interrupted deep 3-0 Vicryl.    Following this, the wound was thoroughly irrigated. Hemostasis was achieved  with Bovie electrocautery. There was excellent perfusion to the tissue;  however, it was fairly indurated, consistent with chronic scarring. The  skin was cleaned up and closed with 3-0 and 2-0 nylon in vertical mattress  fashion, and the skin around the area was cleaned up and debrided. A dry  sterile dressing was applied.    The counts were correct at the end of the case x2. The patient was awakened  and transported to recovery room in stable condition. He will most likely  require IV antibiotics postoperatively.  Lenn Sink, MD    cc:   Lenn Sink, MD        AM/wmx; D: 10/03/2011 03:32 P; T: 10/03/2011 08:56 P; Doc# [id number]; Job#  [id number]

## 2011-10-03 NOTE — Progress Notes (Addendum)
Cardiology Progress Note         NAME:  Travis [name]   DOB:   1941/03/24   MRN:   [id number]     Assessment/Plan:   CAD: medical management /Limited options for revascularization cont asa, BB, statin, plavix , imdur.  Mod to high risk for non vascular surgery.  Hx ICM: euvolemic at present  HTN: on norvasc, hytrin  Hyperlipidemia: on statin, omega 3   Hx SSS: s/p PPM   Osteomyelitis L foot: vascular eval for potential OR.  NPO nor now.     CARDIOLOGY ATTENDING  Patient seen and examined.  Agree with above.    Tharon Aquas, MD, [facility]            Subjective:   71 yr old male admitted for L foot infection. Hx of CAD - LM - distal 30%/CTO of Lcflex/CTO of RCA/LAD prox 30%; apical 70%; ischemic cardiomyopathy - EF 45%, severe PVD; CKD. Has been admitted for ACS/NSTEMI Previously - most recent 4/13 - medical management for his CAD since revascularisation options are limited. No CP.         Cardiac ROS: Patient denies any exertional chest pain, dyspnea, palpitations, syncope, orthopnea, edema or paroxysmal nocturnal dyspnea.      Review of Systems: Pain L foot, No nausea, indigestion, vomiting, pain, cough, sputum. No bleeding. NPO for vascular procedure        Objective:     BP 179/74   Pulse 100   Temp 99.3 ??F (37.4 ??C)   Resp 21   Ht 5\' 6"  (1.676 m)   Wt 80.287 kg (177 lb)   BMI 28.57 kg/m2   SpO2 99% O2 Flow Rate (L/min): 2 l/min O2 Device: Nasal cannula    Temp (24hrs), Avg:98.8 ??F (37.1 ??C), Min:97.9 ??F (36.6 ??C), Max:99.8 ??F (37.7 ??C)           05/07 1900 - 05/09 0659  In: 783.3 [P.O.:480]  Out: 2625 [Urine:2625]    TELE: SR 1st degree AVB PR.25    General: AAOx3 cooperative, no acute distress.  HEENT: Atraumatic. Pink and moist.  Anicteric sclerae.  Neck : Supple, no thyromegaly.   Lungs: CTA bilaterally. No wheezing/rhonchi/rales.  Heart: Regular rhythm, no murmur, no rubs, no gallops. No JVD. No carotid bruits.   Abdomen: Soft, non-distended, non-tender. + Bowel sounds. No bruits.   Extremities: LE edema, dry dressing L foot, skin peeling on both LE's , no clubbing, no cyanosis. No calf tenderness  Neurologic: Grossly intact.  Alert and oriented X 3.  No acute neurological distress.   Psych: Fair insight. Not anxious or agitated.        Care Plan discussed with:    Comments   Patient y    Family      RN y    Futures trader                    Consultant:          Data Review:     No results found for this basename: ITNL in the last 72 hours   No results found for this basename: CPK:3,CKMB:3,TROIQ:3, in the last 72 hours  Recent Labs   Basename 10/03/11 0740 10/02/11 0224 10/01/11 1843    NA 137 134* --    K 3.8 4.5 --    CL 101 100 --    CO2 26 28 --    BUN 26* 35* --    CREA 1.74* 2.28* --  GLU 76 277* --    PHOS -- 2.9 --    MG -- 1.7 --    ALB -- 2.2* --    WBC 7.8 5.2 6.4    HGB 10.1* 7.2* 7.8*    HCT 29.3* 21.5* 23.5*    PLT 141* 167 184     No results found for this basename: INR:3,PTP:3,APTT:3, in the last 72 hours    Medications reviewed  Current Facility-Administered Medications   Medication Dose Route Frequency   ??? furosemide (LASIX) injection 40 mg  40 mg IntraVENous ONCE   ??? methylPREDNISolone (PF) (SOLU-MEDROL) injection 60 mg  60 mg IntraVENous Q6H   ??? albuterol/ipratropium (DUONEB) neb solution  1 Dose Nebulization Q4HWA RT   ??? epoetin alfa (EPOGEN;PROCRIT) injection 10,000 Units  10,000 Units SubCUTAneous Q7D   ??? albuterol (PROVENTIL VENTOLIN) nebulizer solution 2.5 mg  2.5 mg Nebulization Q2H PRN   ??? Vancomycin level 5/8 21:30  1 Each Other ONCE   ??? aspirin chewable tablet 81 mg  81 mg Oral DAILY   ??? levothyroxine (SYNTHROID) tablet 150 mcg  150 mcg Oral ACB   ??? nitroglycerin (NITROSTAT) tablet 0.4 mg  0.4 mg SubLINGual Q5MIN PRN   ??? terazosin (HYTRIN) capsule 10 mg  10 mg Oral QHS   ??? misoprostol (CYTOTEC) tablet 200 mcg  200 mcg Oral BID   ??? clopidogrel (PLAVIX) tablet 75 mg  75 mg Oral DAILY   ??? metoprolol (LOPRESSOR) tablet 100 mg  100 mg Oral BID   ??? atorvastatin (LIPITOR)  tablet 40 mg  40 mg Oral QHS   ??? isosorbide mononitrate ER (IMDUR) tablet 30 mg  30 mg Oral DAILY   ??? fish oil-omega-3 fatty acids 340-1,000 mg capsule 1 Cap  1 Cap Oral BID   ??? bumetanide (BUMEX) tablet 1 mg  1 mg Oral DAILY   ??? amLODIPine (NORVASC) tablet 5 mg  5 mg Oral DAILY   ??? donepezil (ARICEPT) tablet 5 mg  5 mg Oral QHS   ??? famotidine (PEPCID) tablet 20 mg  20 mg Oral DAILY   ??? finasteride (PROSCAR) tablet 5 mg  5 mg Oral DAILY   ??? insulin glargine (LANTUS) injection 30 Units  30 Units SubCUTAneous DAILY   ??? acetaminophen (TYLENOL) tablet 650 mg  650 mg Oral Q4H PRN   ??? oxyCODONE-acetaminophen (PERCOCET) 5-325 mg per tablet 1 Tab  1 Tab Oral Q4H PRN   ??? morphine injection 1 mg  1 mg IntraVENous Q3H PRN   ??? naloxone (NARCAN) injection 0.4 mg  0.4 mg IntraVENous PRN   ??? diphenhydrAMINE (BENADRYL) injection 12.5 mg  12.5 mg IntraVENous Q4H PRN   ??? ondansetron (ZOFRAN) injection 4 mg  4 mg IntraVENous Q4H PRN   ??? prochlorperazine (COMPAZINE) injection 5 mg  5 mg IntraVENous Q8H PRN   ??? heparin (porcine) injection 5,000 Units  5,000 Units SubCUTAneous Q8H   ??? insulin lispro (HUMALOG) injection   SubCUTAneous AC&HS   ??? glucose chewable tablet 16 g  4 Tab Oral PRN   ??? dextrose (D50W) injection Syrg 12.5-25 g  12.5-25 g IntraVENous PRN   ??? glucagon (GLUCAGEN) injection 1 mg  1 mg IntraMUSCular PRN   ??? Vancomycin dosing per levels  1 Each Other PRN   ??? piperacillin-tazobactam (ZOSYN) 3.375 g in 0.9% sodium chloride (MBP/ADV) 100 mL MBP  3.375 g IntraVENous Q8H   ??? ranolazine ER (RANEXA) tablet 500 mg  500 mg Oral BID   ??? ferrous sulfate tablet 325 mg  1 Tab Oral BID WITH MEALS         Stephanie A. Vilma Prader, NP

## 2011-10-03 NOTE — Progress Notes (Signed)
Medical Progress Note      NAME: Travis [name]   DOB:  [birth year]  MRM:  [id number]    Date/Time: 10/03/2011        Principal Problem:   *Osteomyelitis ()  Active Problems:     CAD (coronary artery disease) ()   Cath 1999 distal disease in the apical LAD and right coronary artery with    a normal ejection fraction.   Non q MI in 7/06.cath - mod LAD abn RCA disease up to 50-60% , small    occluded LCX with normal LVEF.     (?NSTEMI) during admission with pneumonia and ARF 05/2009 - proceeded to    have a PCI   (DES) to RCA.     NSTEMI (trop ~30) on 06/21/09 but cath showed stable findings.     NSTEMI with another cath in 2/11 - the stent was patent and no new    findings.     Multiple NSTEMI's in 5/11 - proceeded on 10/16/09 to PCI of 85% ulcerated    plaque mid RCA successfully stented with 2.75 Xience.  CTO Cx successful    PTCA and DES of proximal part with 2.25 Atom.            Hyperlipidemia (10/06/2009)     HTN (hypertension) (10/13/2009)     PAD (peripheral artery disease) (05/18/2011)   PVR tracing 05/18/2011 suggest bilateral SFA occlusion and calf vessel    disease    and high grade stenosis of his proximal fem-pop   Angio March 2012 ([facility]): r sfa-ak pop with prox stenosis, 1 vs r/o; left    diffuse sfa , severe tpt disease     CKD (chronic kidney disease) stage 3, GFR 30-59 ml/min (07/18/2011)     Foot ulcer (07/18/2011)     Anemia of other chronic disease (08/21/2011)     SSS (sick sinus syndrome) (08/27/2011)   07/20/09: RV: [employer] lead, model number 2088, serial number [id number],    RA: [employer] lead, model number 1488TC, serial number [id number],      Generator by [employer], model number 5826, serial number [id number],             Dementia (08/31/2011)     Chronic pain ()   back pain     GERD (gastroesophageal reflux disease) ()     Hypothyroidism ()     Hx of thromboembolism ()   leg     Pulmonary hypertension ()   PASP 60 on echo 06/22/09     CHF (congestive heart failure) ()     COPD (chronic obstructive  pulmonary disease) ()   mild COPD, recurrent pneumonia, chronic bronchitis (followed by Dr. Leroy Libman)     Sleep apnea ()   on CPAP     PVD (peripheral vascular disease) ()     Hyponatremia (10/01/2011)     Hyperkalemia (10/01/2011)     ARF (acute renal failure) (10/01/2011)        Assessment/Plan:   1.  Osteomyelitis / Foot ulcer - Start empiric vanco and zosyn. Culture and MRSA screen. Vascular evaluation appreciated. Possible amputation.     2.  PAD (peripheral artery disease) / PVD (peripheral vascular disease) - Severe. Vascular evaluation appreciated.     3.  CAD (coronary artery disease) / CHF (congestive heart failure), chronic systolic, EF 40 % - Multiple STEMIs and stents. Consult cardiology to clear him for likley surgery. ON Plavix and ASA (although may have to hold those prior  to any surgery), Ranexa, BB, IMDUR. Resume bumex.     4.  Acute on chronic renal failure. Worse due to volume depletion/ bumex. Renal evaluation appreciated.      5.  Dm with renal complications / DM foot ulcer / Uncontrolled.- Resume lantus, DM diet , SSI per protocol.     6.  Anemia of other chronic disease - Transfuse 2 untis of PRBC. EPO per renal. Continue PO iron. Monitor H/H    7.  SSS (sick sinus syndrome) - Pacer placed.  Follow on tele.     8.  HTN (hypertension) - Continue norvasc, metoprolol     9.  Dementia - Stable and mild. Continue aricept.     10.  GERD (gastroesophageal reflux disease) - Continue PPI      11.  Hypothyroidism - Continue synthroid     12.  COPD (chronic obstructive pulmonary disease) with exacerbation- Hx recurrent pneumonia, chronic bronchitis (followed by Dr. Leroy Libman). Albuterol. Will start solumedrol and consult pulmonary    13.  Sleep apnea - on CPAP     14.  Hyperlipidemia - Continue fish oil and lipitor     15.  BPH- Continue Proscar and terazosin             Subjective:     Chief Complaint:  Foot pain, SOB    ROS:  (bold if positive, if negative)    SOB/DOE  Tolerating PT  Tolerating Diet           Objective:       Vitals:          Last 24hrs VS reviewed since prior progress note. Most recent are:    Visit Vitals   Item Reading   ??? BP 166/77   ??? Pulse 90   ??? Temp 98.9 ??F (37.2 ??C)   ??? Resp 20   ??? Ht 5\' 6"  (1.676 m)   ??? Wt 177 lb   ??? BMI 28.57 kg/m2   ??? SpO2 85%     SpO2 Readings from Last 6 Encounters:   10/03/11 85%   10/03/11 85%   09/03/11 95%   08/13/11 99%   08/13/11 99%   07/24/11 98%    O2 Flow Rate (L/min): 2 l/min       Intake/Output Summary (Last 24 hours) at 10/03/11 0730  Last data filed at 10/03/11 0441   Gross per 24 hour   Intake  543.3 ml   Output   1325 ml   Net -781.7 ml          Exam:     Physical Exam:    Gen:  Well-developed, well-nourished, in no acute distress  HEENT:  Pink conjunctivae, PERRL, hearing intact to voice, moist mucous membranes  Neck:  Supple, without masses, thyroid non-tender  Resp:    wheezes and decreased air entry BL   Card:  No murmurs, normal S1, S2 without thrills, bruits or peripheral edema  Abd:  Soft, non-tender, non-distended, normoactive bowel sounds are present, no palpable organomegaly and no detectable hernias  Lymph:  No cervical or inguinal adenopathy  Musc:  No cyanosis or clubbing  Skin:  To gangrene and non-healing wound  Neuro:  Cranial nerves are grossly intact, no focal motor weakness, follows commands appropriately  Psych:  Good insight, oriented to person, place and time, alert           Medications Reviewed: (see below)    Lab Data Reviewed: (see below)    ______________________________________________________________________  Medications:     Current Facility-Administered Medications   Medication Dose Route Frequency   ??? epoetin alfa (EPOGEN;PROCRIT) injection 10,000 Units  10,000 Units SubCUTAneous Q7D   ??? albuterol (PROVENTIL VENTOLIN) nebulizer solution 2.5 mg  2.5 mg Nebulization Q2H PRN   ??? Vancomycin level 5/8 21:30  1 Each Other ONCE   ??? aspirin chewable tablet 81 mg  81 mg Oral DAILY   ??? levothyroxine (SYNTHROID) tablet 150 mcg  150  mcg Oral ACB   ??? nitroglycerin (NITROSTAT) tablet 0.4 mg  0.4 mg SubLINGual Q5MIN PRN   ??? terazosin (HYTRIN) capsule 10 mg  10 mg Oral QHS   ??? misoprostol (CYTOTEC) tablet 200 mcg  200 mcg Oral BID   ??? clopidogrel (PLAVIX) tablet 75 mg  75 mg Oral DAILY   ??? metoprolol (LOPRESSOR) tablet 100 mg  100 mg Oral BID   ??? atorvastatin (LIPITOR) tablet 40 mg  40 mg Oral QHS   ??? isosorbide mononitrate ER (IMDUR) tablet 30 mg  30 mg Oral DAILY   ??? fish oil-omega-3 fatty acids 340-1,000 mg capsule 1 Cap  1 Cap Oral BID   ??? bumetanide (BUMEX) tablet 1 mg  1 mg Oral DAILY   ??? amLODIPine (NORVASC) tablet 5 mg  5 mg Oral DAILY   ??? donepezil (ARICEPT) tablet 5 mg  5 mg Oral QHS   ??? famotidine (PEPCID) tablet 20 mg  20 mg Oral DAILY   ??? finasteride (PROSCAR) tablet 5 mg  5 mg Oral DAILY   ??? insulin glargine (LANTUS) injection 30 Units  30 Units SubCUTAneous DAILY   ??? acetaminophen (TYLENOL) tablet 650 mg  650 mg Oral Q4H PRN   ??? oxyCODONE-acetaminophen (PERCOCET) 5-325 mg per tablet 1 Tab  1 Tab Oral Q4H PRN   ??? morphine injection 1 mg  1 mg IntraVENous Q3H PRN   ??? naloxone (NARCAN) injection 0.4 mg  0.4 mg IntraVENous PRN   ??? diphenhydrAMINE (BENADRYL) injection 12.5 mg  12.5 mg IntraVENous Q4H PRN   ??? ondansetron (ZOFRAN) injection 4 mg  4 mg IntraVENous Q4H PRN   ??? prochlorperazine (COMPAZINE) injection 5 mg  5 mg IntraVENous Q8H PRN   ??? 0.9% sodium chloride infusion  50 mL/hr IntraVENous CONTINUOUS   ??? heparin (porcine) injection 5,000 Units  5,000 Units SubCUTAneous Q8H   ??? insulin lispro (HUMALOG) injection   SubCUTAneous AC&HS   ??? glucose chewable tablet 16 g  4 Tab Oral PRN   ??? dextrose (D50W) injection Syrg 12.5-25 g  12.5-25 g IntraVENous PRN   ??? glucagon (GLUCAGEN) injection 1 mg  1 mg IntraMUSCular PRN   ??? Vancomycin dosing per levels  1 Each Other PRN   ??? piperacillin-tazobactam (ZOSYN) 3.375 g in 0.9% sodium chloride (MBP/ADV) 100 mL MBP  3.375 g IntraVENous Q8H   ??? ranolazine ER (RANEXA) tablet 500 mg  500 mg Oral BID    ??? ferrous sulfate tablet 325 mg  1 Tab Oral BID WITH MEALS            Lab Review:     Recent Labs   [facility] 10/02/11 0224 10/01/11 1843    WBC 5.2 6.4    HGB 7.2* 7.8*    HCT 21.5* 23.5*    PLT 167 184     Recent Labs   Basename 10/02/11 0224    NA 134*    K 4.5    CL 100    CO2 28    GLU 277*    BUN  35*    CREA 2.28*    CA 7.6*    MG 1.7    PHOS 2.9    ALB 2.2*    TBIL 0.5    SGOT 16    INR --     Lab Results   Component Value Date/Time    POC GLUCOSE 307 10/02/2011  9:05 PM    POC GLUCOSE 197 10/02/2011  5:00 PM    POC GLUCOSE 188 10/02/2011  8:32 AM    POC GLUCOSE 488 10/01/2011 10:27 PM    POC GLUCOSE 139 09/03/2011 11:45 AM     No results found for this basename: PH:3,PCO2:3,PO2:3,HCO3:3,FIO2:3 in the last 72 hours  No results found for this basename: INR:3 in the last 72 hours    Lab Results   Component Value Date/Time    Specimen Description: NASAL 10/01/2011 10:58 PM    Specimen Description: FOOT 10/01/2011 10:58 PM    Specimen Description: BLOOD 10/01/2011  8:15 PM    Specimen Description: BLOOD 08/23/2011 10:23 PM     Lab Results   Component Value Date/Time    Culture result: MRSA NOT PRESENT AT 16 HOURS 10/01/2011 10:58 PM    Culture result: PENDING 10/01/2011 10:58 PM    Culture result: NO GROWTH AFTER 13 HOURS 10/01/2011  8:15 PM    Culture result: NO GROWTH 5 DAYS 08/23/2011 10:23 PM                     Total time spent with patient: 5                  Care Plan discussed with: Patient, Nursing Staff and >50% of time spent in counseling and coordination of care    Discussed:  Care Plan    Prophylaxis:  Hep SQ    Disposition:  SNF/LTC           ___________________________________________________    Attending Physician: Cleotis Nipper, MD

## 2011-10-03 NOTE — Progress Notes (Signed)
[facility] Dosing Services: Antimicrobial Stewardship Daily Doc    Consult for antibiotic dosing of Vancomycin/Zosyn by Dr. Eulah Pont  Pharmacist reviewed antibiotic appropriateness for 71 year old male for indication of left foot infection/osteo right great toe - possible amputation per vascular  Day of Therapy 3    Ht Readings from Last 1 Encounters:   10/01/11 167.6 cm (66")        Wt Readings from Last 1 Encounters:   10/01/11 80.287 kg (177 lb)      Vancomycin therapy:  Current maintenance dose: starting 1250 (mg) every 24 hours (frequency).   Dose calculated to approximate a therapeutic trough of 15-20 mcg/mL.    Last trough level 15.2 mcg/ml obtained 24 hrs post load dose of 2 gm  Plan for level / Adjustment in Therapy: reassess another trough after 2 doses of this q24h regimen    Non-Kinetic Antimicrobial Dosing Regimen:   Current Regimen:  Zosyn to 3.375 gm IV q8hr each dose over 4 hours  Recommendation: Cont same  Other Antimicrobial   (not dosed by pharmacist)    Cultures 5/7 MRSA swab negative  5/7 Foot no definite org pending  5/7 Blood NG pending   Serum Creatinine Lab Results   Component Value Date/Time    Creatinine (POC) 2.3 10/01/2011  6:48 PM    Creatinine 1.74 10/03/2011  7:40 AM         Creatinine Clearance Estimated Creatinine Clearance: 38.2 ml/min (based on Cr of 1.74).     Temp Temp: 99.3 ??F (37.4 ??C) ()       WBC Lab Results   Component Value Date/Time    WBC 7.8 10/03/2011  7:40 AM        H/H Lab Results   Component Value Date/Time    HGB 10.1 10/03/2011  7:40 AM        Platelets    Lab Results   Component Value Date/Time    PLATELET 141 10/03/2011  7:40 AM          Pharmacist MARGARET R CAVANAUGH, RPh, BCPS [phone])

## 2011-10-03 NOTE — Progress Notes (Signed)
O2 sat on room air 87%. Pt in no distress, denies SOB but states "Id like to have one of those breathing treatments". RT notified, pt rec'd neb tx, reported "I feel better"

## 2011-10-03 NOTE — Progress Notes (Signed)
1 Percocet PO given for c/o  L foot pain. See pain scale

## 2011-10-03 NOTE — Progress Notes (Signed)
TRANSFER - IN REPORT:    Verbal report received from [facility]) on Travis [name]  being received from OR(unit) for routine post - op      Report consisted of patient???s Situation, Background, Assessment and   Recommendations(SBAR).     Information from the following report(s) SBAR, Kardex, OR Summary, Procedure Summary and Intake/Output was reviewed with the receiving nurse.    Opportunity for questions and clarification was provided.      Assessment completed upon patient???s arrival to unit and care assumed.

## 2011-10-03 NOTE — Brief Op Note (Signed)
BRIEF OPERATIVE NOTE    Date of Procedure: 10/03/2011   Preoperative Diagnosis: left great toe osteomyelitis  Postoperative Diagnosis: left great toe osteomyelitis    Procedure: Procedure(s):  RAY AMPUTATION LEFT GREAT TOE   Surgeon(s) and Role:     Lenn Sink, MD - Primary  Anesthesia: Regional   Estimated Blood Loss: 150 cc  Specimens:   ID Type Source Tests Collected by Time Destination   1 : left first metatarsal Preservative Toe  Lenn Sink, MD 10/03/2011 1435 Pathology      Findings: well perfused tissue but chronically indurated; sutured loosely with packing in between sutures  Complications: none  Implants: * No implants in log *

## 2011-10-03 NOTE — Consults (Signed)
Name: Vilinda Boehringer: [facility]   DOB: 1940-07-31 Admit Date: 10/01/2011   Phone: [phone]  Room: 331/01   PCP: Sol Blazing, MD  MRN: [id number]   Date: 10/03/2011  Code: Full Code        HPI:    10:03 AM       History was obtained from patient and chart.      I was asked by Otho Darner, MD to see Rande Lawman in consultation for a chief complaint of COPD exacerbation.      Mr. Kunert is a 71 year old African American male who presented to the [name] 2 days ago with a left foot infection which was diagnosed as osteomyelitis.  He has a history of uncontrolled DM, renal failure, CAD, MI March of 2013, and severe PAD.  He was also found to be anemic, which improved after transfusion.  Dr. Alvina Filbert has evaluated the patient and amputation of the left great toe is planned for today.      He also has a history of COPD and is followed by Dr. Kermit Balo from our group.  His current regimen is Ventolin only twice a day.  He was previously on Advair, but that was discontinued at his visit last November because he was doing well without exacerbations and had not been using it.  He also has a history of OSA which is followed by the [place].  He is on CPAP but is not sure what pressure he is on and does know if anyone can bring his machine in.  He is not on home O2.    Today, patient complains of shortness of breath, worse with exertion, and wheezing which improved with nebulizer treatments.  Admits to occasional cough which he states is chronic and nonproductive.  Denies CP.  Denies fever or chills.  He does have left foot pain secondary to infection.        Images: No recent chest images.  Images of the foot consistent with osteomyelitis.            Past Medical History   Diagnosis Date   ??? CAD (coronary artery disease)      see problem list   ??? HTN (hypertension)    ??? PVD (peripheral vascular disease)    ??? Hyperlipidemia    ??? Sleep apnea      on CPAP   ??? COPD (chronic obstructive  pulmonary disease)      mild COPD, recurrent pneumonia, chronic bronchitis (followed by Dr. Leroy Libman)   ??? CKD (chronic kidney disease)      Cr 1.7 in 10/09   ??? CHF (congestive heart failure)    ??? Anemia    ??? Pulmonary hypertension      PASP 60 on echo 06/22/09   ??? Pneumonia    ??? GERD (gastroesophageal reflux disease)    ??? Chronic pain      back pain   ??? Cataract    ??? GI bleed      multiple bleeding episodes and blood transfusions.  Dr. Monika Salk has followed..  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   ??? Foot ulcer 07/18/2011   ??? Osteomyelitis    ??? Hypothyroidism    ??? Hx of thromboembolism      leg   ??? DM type 2 causing renal disease    ??? DM foot ulcer        Past Surgical History   Procedure Date   ???  Hx orthopaedic      knee replacement   ??? Pr vascular surgery procedure unlist      arterial bypass   ??? Hx pacemaker 2.24.2011     [employer] Jude lead, model number 1488TC, serial number [id number]   ??? Pr cardiac surg procedure unlist      cardiac stent   ??? Hx cataract removal      removed rt eye       Family History   Problem Relation Age of Onset   ??? Hypertension     ??? Cancer Mother      cervical and colon   ??? Cancer Father      lung       History   Substance Use Topics   ??? Smoking status: Former Smoker -- 20 years     Types: Cigarettes     Quit date: 11/09/1978   ??? Smokeless tobacco: Never Used   ??? Alcohol Use: No       No Known Allergies    Current Facility-Administered Medications   Medication Dose Route Frequency   ??? furosemide (LASIX) injection 40 mg  40 mg IntraVENous ONCE   ??? methylPREDNISolone (PF) (SOLU-MEDROL) injection 60 mg  60 mg IntraVENous Q6H   ??? albuterol/ipratropium (DUONEB) neb solution  1 Dose Nebulization Q4HWA RT   ??? pneumococcal 23-valent (PNEUMOVAX 23) injection 0.5 mL  0.5 mL IntraMUSCular PRIOR TO DISCHARGE   ??? vancomycin (VANCOCIN) 1.25 g in 0.9% sodium chloride 250 mL IVPB  1,250 mg IntraVENous Q24H   ??? epoetin alfa (EPOGEN;PROCRIT) injection 10,000 Units  10,000 Units SubCUTAneous Q7D   ??? albuterol (PROVENTIL VENTOLIN)  nebulizer solution 2.5 mg  2.5 mg Nebulization Q2H PRN   ??? Vancomycin level 5/8 21:30  1 Each Other ONCE   ??? aspirin chewable tablet 81 mg  81 mg Oral DAILY   ??? levothyroxine (SYNTHROID) tablet 150 mcg  150 mcg Oral ACB   ??? nitroglycerin (NITROSTAT) tablet 0.4 mg  0.4 mg SubLINGual Q5MIN PRN   ??? terazosin (HYTRIN) capsule 10 mg  10 mg Oral QHS   ??? misoprostol (CYTOTEC) tablet 200 mcg  200 mcg Oral BID   ??? clopidogrel (PLAVIX) tablet 75 mg  75 mg Oral DAILY   ??? metoprolol (LOPRESSOR) tablet 100 mg  100 mg Oral BID   ??? atorvastatin (LIPITOR) tablet 40 mg  40 mg Oral QHS   ??? isosorbide mononitrate ER (IMDUR) tablet 30 mg  30 mg Oral DAILY   ??? fish oil-omega-3 fatty acids 340-1,000 mg capsule 1 Cap  1 Cap Oral BID   ??? bumetanide (BUMEX) tablet 1 mg  1 mg Oral DAILY   ??? amLODIPine (NORVASC) tablet 5 mg  5 mg Oral DAILY   ??? donepezil (ARICEPT) tablet 5 mg  5 mg Oral QHS   ??? famotidine (PEPCID) tablet 20 mg  20 mg Oral DAILY   ??? finasteride (PROSCAR) tablet 5 mg  5 mg Oral DAILY   ??? insulin glargine (LANTUS) injection 30 Units  30 Units SubCUTAneous DAILY   ??? acetaminophen (TYLENOL) tablet 650 mg  650 mg Oral Q4H PRN   ??? oxyCODONE-acetaminophen (PERCOCET) 5-325 mg per tablet 1 Tab  1 Tab Oral Q4H PRN   ??? morphine injection 1 mg  1 mg IntraVENous Q3H PRN   ??? naloxone (NARCAN) injection 0.4 mg  0.4 mg IntraVENous PRN   ??? diphenhydrAMINE (BENADRYL) injection 12.5 mg  12.5 mg IntraVENous Q4H PRN   ??? ondansetron (ZOFRAN) injection 4 mg  4 mg IntraVENous Q4H PRN   ??? prochlorperazine (COMPAZINE) injection 5 mg  5 mg IntraVENous Q8H PRN   ??? heparin (porcine) injection 5,000 Units  5,000 Units SubCUTAneous Q8H   ??? insulin lispro (HUMALOG) injection   SubCUTAneous AC&HS   ??? glucose chewable tablet 16 g  4 Tab Oral PRN   ??? dextrose (D50W) injection Syrg 12.5-25 g  12.5-25 g IntraVENous PRN   ??? glucagon (GLUCAGEN) injection 1 mg  1 mg IntraMUSCular PRN   ??? piperacillin-tazobactam (ZOSYN) 3.375 g in 0.9% sodium chloride (MBP/ADV) 100  mL MBP  3.375 g IntraVENous Q8H   ??? ranolazine ER (RANEXA) tablet 500 mg  500 mg Oral BID   ??? ferrous sulfate tablet 325 mg  1 Tab Oral BID WITH MEALS         REVIEW OF SYSTEMS   Negative except as stated in the HPI.      Physical Exam:   Visit Vitals   Item Reading   ??? BP 179/74   ??? Pulse 100   ??? Temp 99.3 ??F (37.4 ??C)   ??? Resp 21   ??? Ht 5\' 6"  (1.676 m)   ??? Wt 80.287 kg (177 lb)   ??? BMI 28.57 kg/m2   ??? SpO2 99%   on 2L  General:  Alert, cooperative, no distress, appears stated age.   Head:  Normocephalic, without obvious abnormality, atraumatic.   Eyes:  Conjunctivae/corneas clear.    Nose: Nares normal. Septum midline. Mucosa normal.    Throat: Lips, mucosa, and tongue normal. Class 4 airway.   Neck: Supple, symmetrical, trachea midline.  Subcutaneous mass in the superior clavicular region on the left.  Palpation consistent with possible lipoma.    Lungs:   Decreased breath sounds with bilateral expiratory wheezes and faint crackles in the bases.   Chest wall:  No tenderness or deformity.   Heart:  Regular rate and rhythm, S1, S2 normal, no murmur, click, rub or gallop.   Abdomen:   Soft, non-tender. Bowel sounds normal. No masses,  No organomegaly.   Extremities: LLE with left great toe infection and wound partially wrapped.  Left elbow with dressing in place over abrasion s/p fall per patient.  There is BLE edema, 1+.   Pulses: 2+ and symmetric all extremities.   Skin: Dry and peeling, especially the LEs.  No obvious rash.    Lymph nodes: Swelling in the left cervical/ left supraclavicular region.  Palpation seems consistent with lipoma.   Neurologic: Grossly nonfocal       Lab Results   Component Value Date/Time    Sodium 137 10/03/2011  7:40 AM    Potassium 3.8 10/03/2011  7:40 AM    Chloride 101 10/03/2011  7:40 AM    CO2 26 10/03/2011  7:40 AM    BUN 26 10/03/2011  7:40 AM    Creatinine 1.74 10/03/2011  7:40 AM    Glucose 76 10/03/2011  7:40 AM    Calcium 8.1 10/03/2011  7:40 AM    Magnesium 1.7 10/02/2011  2:24 AM     Phosphorus 2.9 10/02/2011  2:24 AM    Lactic acid 4.5 07/15/2009  8:48 PM       Lab Results   Component Value Date/Time    WBC 7.8 10/03/2011  7:40 AM    HGB 10.1 10/03/2011  7:40 AM    PLATELET 141 10/03/2011  7:40 AM    MCV 82.3 10/03/2011  7:40 AM       Lab  Results   Component Value Date/Time    INR 1.1 05/20/2011  8:20 AM    Bilirubin, total 0.5 10/02/2011  2:24 AM    ALT 17 10/02/2011  2:24 AM    AST 16 10/02/2011  2:24 AM    Alk. phosphatase 99 10/02/2011  2:24 AM    Protein, total 7.3 10/02/2011  2:24 AM    Albumin 2.2 10/02/2011  2:24 AM    Globulin 5.1 10/02/2011  2:24 AM       Lab Results   Component Value Date/Time    Iron 15 08/25/2011 10:45 AM    TIBC 147 08/25/2011 10:45 AM    Iron % saturation 10 08/25/2011 10:45 AM    Ferritin 411 10/02/2011 10:47 AM       Lab Results   Component Value Date/Time    Sed rate (ESR) 128 06/21/2009  1:10 PM    TSH 2.32 08/26/2011  5:00 PM        No results found for this basename: ph, phi, pco2, pco2i, po2, po2i, hco3, hco3i, fio2, fio2i       Lab Results   Component Value Date/Time    CK 338 08/23/2011  6:03 AM    CK - MB 15.3 08/23/2011  6:03 AM    CK-MB Index 4.5 08/23/2011  6:03 AM    Troponin-I, Qt. 6.44 08/23/2011  6:03 AM    BNP 102 11/02/2007  4:10 AM        Lab Results   Component Value Date/Time    Culture result:  Value: MRSA NOT PRESENT     Screening of patient nares for MRSA is for surveillance purposes and, if positive, to facilitate isolation considerations in high risk settings. It is not intended for automatic decolonization interventions per se as regimens are not sufficiently effective to warrant routine use. 10/01/2011 10:58 PM    Culture result: PENDING 10/01/2011 10:58 PM    Culture result: NO GROWTH 2 DAYS 10/01/2011  8:15 PM    Culture result: NO GROWTH 5 DAYS 08/23/2011 10:23 PM       Lab Results   Component Value Date/Time    Hep B surface Ag Negative 06/06/2009 11:45 AM       Lab Results   Component Value Date/Time    Vancomycin,trough 6.9 11/06/2007  4:15 PM    CK 338 08/23/2011  6:03 AM     CK 331 08/22/2011 12:45 AM       Lab Results   Component Value Date/Time    Color YELLOW 09/02/2011  5:00 PM    Appearance CLEAR 09/02/2011  5:00 PM    pH 5.5 09/02/2011  5:00 PM    Protein 30 09/02/2011  5:00 PM    Glucose NEGATIVE  09/02/2011  5:00 PM    Ketone NEGATIVE  09/02/2011  5:00 PM    Bilirubin NEGATIVE  09/02/2011  5:00 PM    Blood NEGATIVE  09/02/2011  5:00 PM    Urobilinogen 1.0 09/02/2011  5:00 PM    Nitrites NEGATIVE  09/02/2011  5:00 PM    Leukocyte Esterase NEGATIVE  09/02/2011  5:00 PM    WBC 0-4 09/02/2011  5:00 PM    RBC 0-3 09/02/2011  5:00 PM    Bacteria NEGATIVE  09/02/2011  5:00 PM       IMPRESSION  ?? COPD Exacerbation  ?? Osteomyelitis of left great toe  ?? PAD  ?? DM, uncontrolled  ?? Acute/Chronic Renal Failure  ?? Anemia: improved s/p transfusion  ??  CAD/CM/CHF/Hx of multiple STEMIs; Cardiac cath 06/2011 (EF 40%);  S/P stent and pacemaker  ?? OSA    PLAN  ?? Continue scheduled Duonebs and Solumedrol.  Will restart Advair 250/50  ?? Check CXR  ?? Antibiotics per primary team and vascular surgery  ?? Fluid balance per Renal  ?? Will place on CPAP auto while inpatient as patient is not sure he can get someone to bring in his home CPAP  ?? GI Prophylaxis: Pepcid  ?? DVT Prophylaxis: subcutaneous heparin    Thank you for allowing [name] to participate in the care of this patient .  We will be happy to follow along in his care with you.      Amedeo Kinsman, PA

## 2011-10-03 NOTE — Progress Notes (Signed)
Post-Anesthesia Evaluation & Assessment    Visit Vitals   Item Reading   ??? BP 130/49   ??? Pulse 76   ??? Temp 97.6 ??F (36.4 ??C)   ??? Resp 18   ??? Ht 5\' 6"  (1.676 m)   ??? Wt 80.287 kg (177 lb)   ??? BMI 28.57 kg/m2   ??? SpO2 96%       Nausea/Vomiting: no nausea    Post-operative hydration adequate.    Pain score (VAS): 0    Mental status & Level of consciousness: alert and oriented x 3    Neurological status: moves all extremities, sensation grossly intact    Pulmonary status: airway patent, no supplemental oxygen required    Complications related to anesthesia: none    Patient has met all discharge requirements.    Additional comments:        Hayden Rasmussen, MD

## 2011-10-03 NOTE — Other (Addendum)
1615  Pt on monitor, O2 2 liter nasal cannula for transport to room 331.      1645  TRANSFER - OUT REPORT:    Verbal report given to Cristal Ford RN on Travis [name]  being transferred to 331 for routine post - op       Report consisted of patient???s Situation, Background, Assessment and   Recommendations(SBAR).     Information from the following report(s) SBAR, OR Summary, Intake/Output and MAR was reviewed with the receiving nurse.    Opportunity for questions and clarification was provided.

## 2011-10-03 NOTE — Progress Notes (Signed)
RNA F/U Note    CC: AKI on CKD3    Travis [name] is without complaint. He denies any sob or pain in legs. He denies any f/c/ns, HA, cp, n/v/d/abdominal pain.     ROS: Otherwise negative    Meds: Reviewed    PE:  157/83 105 20 94% 99.5   I/O: 543/1325  Gen: NAD  HEENT: anicteric, OP clear  Neck: supple  Chest: CTAB  Heart: rrr  Abdomen: obese, soft, NTND active bs  Extrem: no edema  Neuro: axox4  Skin: warm and dry    Labs:  Reviewed   BUN/Cr down to 26/1.74    UA reviewed    Imaging: Reviewed    Impression:  1. Non-oliguric AKI/CKD3:  Improved. Will continue to follow closely.    2. Left foot OM  3. PAD/PVD  4. DM  5. HTN    Ellis Savage, M.D.  RNA

## 2011-10-04 LAB — CBC WITH AUTOMATED DIFF
ABS. BASOPHILS: 0 10*3/uL (ref 0.0–0.1)
ABS. EOSINOPHILS: 0 10*3/uL (ref 0.0–0.4)
ABS. LYMPHOCYTES: 0.7 10*3/uL — ABNORMAL LOW (ref 0.8–3.5)
ABS. MONOCYTES: 0.3 10*3/uL (ref 0.0–1.0)
ABS. NEUTROPHILS: 6.1 10*3/uL (ref 1.8–8.0)
BASOPHILS: 0 % (ref 0–1)
EOSINOPHILS: 0 % (ref 0–7)
HCT: 28.1 % — ABNORMAL LOW (ref 36.6–50.3)
HGB: 9.4 g/dL — ABNORMAL LOW (ref 12.1–17.0)
LYMPHOCYTES: 10 % — ABNORMAL LOW (ref 12–49)
MCH: 28.1 PG (ref 26.0–34.0)
MCHC: 33.5 g/dL (ref 30.0–36.5)
MCV: 83.9 FL (ref 80.0–99.0)
MONOCYTES: 4 % — ABNORMAL LOW (ref 5–13)
NEUTROPHILS: 86 % — ABNORMAL HIGH (ref 32–75)
PLATELET: 134 10*3/uL — ABNORMAL LOW (ref 150–400)
RBC: 3.35 M/uL — ABNORMAL LOW (ref 4.10–5.70)
RDW: 17.9 % — ABNORMAL HIGH (ref 11.5–14.5)
WBC: 7.1 10*3/uL (ref 4.1–11.1)

## 2011-10-04 LAB — CULTURE, WOUND W GRAM STAIN
Culture result:: NO GROWTH
GRAM STAIN: NONE SEEN

## 2011-10-04 LAB — METABOLIC PANEL, BASIC
Anion gap: 8 mmol/L (ref 5–15)
BUN/Creatinine ratio: 15 (ref 12–20)
BUN: 27 MG/DL — ABNORMAL HIGH (ref 6–20)
CO2: 28 MMOL/L (ref 21–32)
Calcium: 7.8 MG/DL — ABNORMAL LOW (ref 8.5–10.1)
Chloride: 102 MMOL/L (ref 97–108)
Creatinine: 1.76 MG/DL — ABNORMAL HIGH (ref 0.45–1.15)
GFR est AA: 46 mL/min/{1.73_m2} — ABNORMAL LOW (ref >60)
GFR est non-AA: 38 mL/min/{1.73_m2} — ABNORMAL LOW (ref >60)
Glucose: 255 MG/DL — ABNORMAL HIGH (ref 65–100)
Potassium: 4.4 MMOL/L (ref 3.5–5.1)
Sodium: 138 MMOL/L (ref 136–145)

## 2011-10-04 LAB — GLUCOSE, POC
Glucose (POC): 195 mg/dL — ABNORMAL HIGH (ref 75–110)
Glucose (POC): 347 mg/dL — ABNORMAL HIGH (ref 75–110)
Glucose (POC): 456 mg/dL — ABNORMAL HIGH (ref 75–110)
Glucose (POC): 257 mg/dL — ABNORMAL HIGH (ref 75–110)

## 2011-10-04 MED ORDER — PHARMACY INFORMATION NOTE
Freq: Once | Status: AC
Start: 2011-10-04 — End: 2011-10-05
  Administered 2011-10-05: 14:00:00

## 2011-10-04 MED ORDER — INSULIN GLARGINE 100 UNIT/ML INJECTION
100 unit/mL | Freq: Every day | SUBCUTANEOUS | Status: DC
Start: 2011-10-04 — End: 2011-10-09
  Administered 2011-10-05 – 2011-10-09 (×5): via SUBCUTANEOUS

## 2011-10-04 MED ORDER — INSULIN LISPRO 100 UNIT/ML INJECTION
100 unit/mL | Freq: Four times a day (QID) | SUBCUTANEOUS | Status: DC
Start: 2011-10-04 — End: 2011-10-09
  Administered 2011-10-04 – 2011-10-09 (×17): via SUBCUTANEOUS

## 2011-10-04 MED ORDER — METHYLPREDNISOLONE (PF) 125 MG/2 ML IJ SOLR
125 mg/2 mL | Freq: Two times a day (BID) | INTRAMUSCULAR | Status: DC
Start: 2011-10-04 — End: 2011-10-05
  Administered 2011-10-05: 01:00:00 via INTRAVENOUS

## 2011-10-04 MED FILL — INSULIN LISPRO 100 UNIT/ML INJECTION: 100 unit/mL | SUBCUTANEOUS | Qty: 12

## 2011-10-04 MED FILL — MORPHINE 2 MG/ML INJECTION: 2 mg/mL | INTRAMUSCULAR | Qty: 1

## 2011-10-04 MED FILL — RANEXA 500 MG TABLET,EXTENDED RELEASE: 500 mg | ORAL | Qty: 1

## 2011-10-04 MED FILL — ARICEPT 5 MG TABLET: 5 mg | ORAL | Qty: 1

## 2011-10-04 MED FILL — IPRATROPIUM BROMIDE 0.02 % SOLN FOR INHALATION: 0.02 % | RESPIRATORY_TRACT | Qty: 2.5

## 2011-10-04 MED FILL — FAMOTIDINE 20 MG TAB: 20 mg | ORAL | Qty: 1

## 2011-10-04 MED FILL — AMLODIPINE 5 MG TAB: 5 mg | ORAL | Qty: 1

## 2011-10-04 MED FILL — MISOPROSTOL 200 MCG TAB: 200 mcg | ORAL | Qty: 1

## 2011-10-04 MED FILL — ISOSORBIDE MONONITRATE SR 30 MG 24 HR TAB: 30 mg | ORAL | Qty: 1

## 2011-10-04 MED FILL — TERAZOSIN 5 MG CAP: 5 mg | ORAL | Qty: 2

## 2011-10-04 MED FILL — FISH OIL 340 MG-1,000 MG CAPSULE: 340-1000 mg | ORAL | Qty: 1

## 2011-10-04 MED FILL — BUMETANIDE 1 MG TAB: 1 mg | ORAL | Qty: 1

## 2011-10-04 MED FILL — METOPROLOL TARTRATE 25 MG TAB: 25 mg | ORAL | Qty: 4

## 2011-10-04 MED FILL — LEVOTHYROXINE 150 MCG TAB: 150 mcg | ORAL | Qty: 1

## 2011-10-04 MED FILL — FINASTERIDE 5 MG TAB: 5 mg | ORAL | Qty: 1

## 2011-10-04 MED FILL — INSULIN LISPRO 100 UNIT/ML INJECTION: 100 unit/mL | SUBCUTANEOUS | Qty: 1

## 2011-10-04 MED FILL — FERROUS SULFATE 325 MG (65 MG ELEMENTAL IRON) TAB: 325 mg (65 mg iron) | ORAL | Qty: 1

## 2011-10-04 MED FILL — ASPIRIN 81 MG CHEWABLE TAB: 81 mg | ORAL | Qty: 1

## 2011-10-04 MED FILL — SOLU-MEDROL (PF) 125 MG/2 ML SOLUTION FOR INJECTION: 125 mg/2 mL | INTRAMUSCULAR | Qty: 2

## 2011-10-04 MED FILL — INSULIN LISPRO 100 UNIT/ML INJECTION: 100 unit/mL | SUBCUTANEOUS | Qty: 8

## 2011-10-04 MED FILL — LIPITOR 20 MG TABLET: 20 mg | ORAL | Qty: 2

## 2011-10-04 MED FILL — INSULIN GLARGINE 100 UNIT/ML INJECTION: 100 unit/mL | SUBCUTANEOUS | Qty: 0.35

## 2011-10-04 MED FILL — PLAVIX 75 MG TABLET: 75 mg | ORAL | Qty: 1

## 2011-10-04 MED FILL — SODIUM CHLORIDE 0.9 % IV PIGGY BACK: INTRAVENOUS | Qty: 100

## 2011-10-04 MED FILL — INSULIN LISPRO 100 UNIT/ML INJECTION: 100 unit/mL | SUBCUTANEOUS | Qty: 7

## 2011-10-04 MED FILL — OXYCODONE-ACETAMINOPHEN 5 MG-325 MG TAB: 5-325 mg | ORAL | Qty: 1

## 2011-10-04 MED FILL — VANCOMYCIN 10 GRAM IV SOLR: 10 gram | INTRAVENOUS | Qty: 1.25

## 2011-10-04 MED FILL — PHARMACY INFORMATION NOTE: Qty: 1

## 2011-10-04 NOTE — Progress Notes (Signed)
NUTRITION    BEST PRACTICE ALERT:   Braden Score: 20      Nutrition: Excellent    Diet: Diabetic    Diet advanced now s/p surgery and pt is tolerating well with good intake.     RD PLAN:   Will continue to monitor po intake and diet tolerance and provide further nutrition intervention as needed for healing.     Curt Jews. Orlene Erm, RD, CNSC

## 2011-10-04 NOTE — Progress Notes (Signed)
Report to Jens Som RN, Verbal at bedside. SBAR, Kardex, MAR, recent results and procedures reviewed

## 2011-10-04 NOTE — Progress Notes (Addendum)
Morphine 2mg  IV given for c/o L foot pain unrelieved by Percocet. See pain scale 0130 Pt reports relief of pain

## 2011-10-04 NOTE — Progress Notes (Signed)
Medical Progress Note      NAME: Travis [name]   DOB:  23-Dec-1940  MRM:  [id number]    Date/Time: 10/04/2011        Principal Problem:   *Osteomyelitis ()  Active Problems:     CAD (coronary artery disease) ()   Cath 1999 distal disease in the apical LAD and right coronary artery with    a normal ejection fraction.   Non q MI in 7/06.cath - mod LAD abn RCA disease up to 50-60% , small    occluded LCX with normal LVEF.     (?NSTEMI) during admission with pneumonia and ARF 05/2009 - proceeded to    have a PCI   (DES) to RCA.     NSTEMI (trop ~30) on 06/21/09 but cath showed stable findings.     NSTEMI with another cath in 2/11 - the stent was patent and no new    findings.     Multiple NSTEMI's in 5/11 - proceeded on 10/16/09 to PCI of 85% ulcerated    plaque mid RCA successfully stented with 2.75 Xience.  CTO Cx successful    PTCA and DES of proximal part with 2.25 Atom.            Hyperlipidemia (10/06/2009)     HTN (hypertension) (10/13/2009)     PAD (peripheral artery disease) (05/18/2011)   PVR tracing 05/18/2011 suggest bilateral SFA occlusion and calf vessel    disease    and high grade stenosis of his proximal fem-pop   Angio March 2012 ([facility]): r sfa-ak pop with prox stenosis, 1 vs r/o; left    diffuse sfa , severe tpt disease     CKD (chronic kidney disease) stage 3, GFR 30-59 ml/min (07/18/2011)     Foot ulcer (07/18/2011)     Anemia of other chronic disease (08/21/2011)     SSS (sick sinus syndrome) (08/27/2011)   07/20/09: RV: [employer] lead, model number 2088, serial number [id number],    RA: [employer] lead, model number 1488TC, serial number [id number],      Generator by [employer], model number 5826, serial number [id number],             Dementia (08/31/2011)     Chronic pain ()   back pain     GERD (gastroesophageal reflux disease) ()     Hypothyroidism ()     Hx of thromboembolism ()   leg     Pulmonary hypertension ()   PASP 60 on echo 06/22/09     CHF (congestive heart failure) ()     COPD (chronic obstructive  pulmonary disease) ()   mild COPD, recurrent pneumonia, chronic bronchitis (followed by Dr. Leroy Libman)     Sleep apnea ()   on CPAP     PVD (peripheral vascular disease) ()     Hyponatremia (10/01/2011)     Hyperkalemia (10/01/2011)     ARF (acute renal failure) (10/01/2011)        Assessment/Plan:   1.  Osteomyelitis / Foot ulcer - s/p L toe amputation on 5/9   -optimize pain control  -continue zosyn/vanc   -blood cultures negative  -wound cultures with no definite organism   -MRSA culture negative   -appreciate vascular's assistance     2.  PAD (peripheral artery disease) / PVD (peripheral vascular disease) - Severe. Vascular evaluation appreciated.     3.  CAD (coronary artery disease) / CHF (congestive heart failure), chronic systolic, EF 40 % - Multiple  STEMIs and stents.   -continue ASA/plavix   -continue Ranexa, beta blockade and imdur   -continue Bumex     4.  Acute on chronic renal failure. Worse due to volume depletion/ bumex. Renal evaluation appreciated.    -Bumex resumed; monitor creatinine closely     5.  Dm with renal complications / DM foot ulcer / Uncontrolled.- Resume lantus, DM diet , SSI per protocol.     6.  Anemia of other chronic disease - Transfuse 2 untis of PRBC. EPO per renal. Continue PO iron. Monitor H/H    7.  SSS (sick sinus syndrome) - Pacer placed.  Follow on tele.     8.  HTN (hypertension) - Continue norvasc, metoprolol     9.  Dementia - Stable and mild. Continue aricept.     10.  GERD (gastroesophageal reflux disease) - Continue PPI      11.  Hypothyroidism - Continue synthroid     12.  COPD (chronic obstructive pulmonary disease) with exacerbation- Hx recurrent pneumonia, chronic bronchitis (followed by Dr. Leroy Libman)  -CXR with ?patchy bibasilar PNA though considering improvement this AM with Bumex, lack of WBC count or fever, lower suspicion  -continue diuresis   -continue duonebs; taper steroids considering no active wheezing   -resume Advair   -continue home CPAP   -pulmonary consulted by  previous physician    13.  Sleep apnea - on CPAP     14.  Hyperlipidemia - Continue fish oil and lipitor     15.  BPH- Continue Proscar and terazosin       Subjective:     Chief Complaint:  "My breathing is fine"     Other than some pain at the surgical site, no complaints this AM. States that he does have a chronic cough that has not changed. No fevers/chills. SOB improved from yesterday. No CP     ROS:  (bold if positive, if negative)    SOB/DOE;improved  Tolerating PT  Tolerating Diet  Foot pain           Objective:       Vitals:          Last 24hrs VS reviewed since prior progress note. Most recent are:    Visit Vitals   Item Reading   ??? BP 151/76   ??? Pulse 77   ??? Temp 98.1 ??F (36.7 ??C)   ??? Resp 18   ??? Ht 5\' 6"  (1.676 m)   ??? Wt 177 lb 11.1 oz   ??? BMI 28.68 kg/m2   ??? SpO2 94%     SpO2 Readings from Last 6 Encounters:   10/04/11 94%   10/04/11 94%   09/03/11 95%   08/13/11 99%   08/13/11 99%   07/24/11 98%    O2 Flow Rate (L/min): 2 l/min       Intake/Output Summary (Last 24 hours) at 10/04/11 0832  Last data filed at 10/04/11 4098   Gross per 24 hour   Intake   1000 ml   Output   1400 ml   Net   -400 ml          Exam:     Physical Exam:    Gen:  Well-developed, well-nourished, in no acute distress  HEENT:  Pink conjunctivae, PERRL, hearing intact to voice, moist mucous membranes  Neck:  Supple, without masses, thyroid non-tender  Resp:  Good air movement. Coarse breath sounds, but no wheezing appreciated   Card:  No  murmurs, normal S1, S2 without thrills, bruits or peripheral edema  Abd:  Soft, non-tender, non-distended, normoactive bowel sounds are present, no palpable organomegaly and no detectable hernias  Lymph:  No cervical or inguinal adenopathy  Musc:  No cyanosis or clubbing  Skin:  To gangrene and non-healing wound  Neuro:  Cranial nerves are grossly intact, no focal motor weakness, follows commands appropriately  Psych:  Good insight, oriented to person, place and time, alert  L elbow skin tear (POA):  superficial. Nontender. Non-erythematous.   L foot C/D/I    Medications Reviewed: (see below)    Lab Data Reviewed: (see below)    ______________________________________________________________________    Medications:     Current Facility-Administered Medications   Medication Dose Route Frequency   ??? furosemide (LASIX) injection 40 mg  40 mg IntraVENous ONCE   ??? methylPREDNISolone (PF) (SOLU-MEDROL) injection 60 mg  60 mg IntraVENous Q6H   ??? albuterol/ipratropium (DUONEB) neb solution  1 Dose Nebulization Q4HWA RT   ??? pneumococcal 23-valent (PNEUMOVAX 23) injection 0.5 mL  0.5 mL IntraMUSCular PRIOR TO DISCHARGE   ??? vancomycin (VANCOCIN) 1.25 g in 0.9% sodium chloride 250 mL IVPB  1,250 mg IntraVENous Q24H   ??? fluticasone-salmeterol (ADVAIR) 229mcg-50mcg/puff  1 Puff Inhalation BID RT   ??? morphine injection 2 mg  2 mg IntraVENous Q2H PRN   ??? epoetin alfa (EPOGEN;PROCRIT) injection 10,000 Units  10,000 Units SubCUTAneous Q7D   ??? albuterol (PROVENTIL VENTOLIN) nebulizer solution 2.5 mg  2.5 mg Nebulization Q2H PRN   ??? aspirin chewable tablet 81 mg  81 mg Oral DAILY   ??? levothyroxine (SYNTHROID) tablet 150 mcg  150 mcg Oral ACB   ??? nitroglycerin (NITROSTAT) tablet 0.4 mg  0.4 mg SubLINGual Q5MIN PRN   ??? terazosin (HYTRIN) capsule 10 mg  10 mg Oral QHS   ??? misoprostol (CYTOTEC) tablet 200 mcg  200 mcg Oral BID   ??? clopidogrel (PLAVIX) tablet 75 mg  75 mg Oral DAILY   ??? metoprolol (LOPRESSOR) tablet 100 mg  100 mg Oral BID   ??? atorvastatin (LIPITOR) tablet 40 mg  40 mg Oral QHS   ??? isosorbide mononitrate ER (IMDUR) tablet 30 mg  30 mg Oral DAILY   ??? fish oil-omega-3 fatty acids 340-1,000 mg capsule 1 Cap  1 Cap Oral BID   ??? bumetanide (BUMEX) tablet 1 mg  1 mg Oral DAILY   ??? amLODIPine (NORVASC) tablet 5 mg  5 mg Oral DAILY   ??? donepezil (ARICEPT) tablet 5 mg  5 mg Oral QHS   ??? famotidine (PEPCID) tablet 20 mg  20 mg Oral DAILY   ??? finasteride (PROSCAR) tablet 5 mg  5 mg Oral DAILY   ??? insulin glargine (LANTUS) injection  30 Units  30 Units SubCUTAneous DAILY   ??? acetaminophen (TYLENOL) tablet 650 mg  650 mg Oral Q4H PRN   ??? oxyCODONE-acetaminophen (PERCOCET) 5-325 mg per tablet 1 Tab  1 Tab Oral Q4H PRN   ??? morphine injection 1 mg  1 mg IntraVENous Q3H PRN   ??? naloxone (NARCAN) injection 0.4 mg  0.4 mg IntraVENous PRN   ??? diphenhydrAMINE (BENADRYL) injection 12.5 mg  12.5 mg IntraVENous Q4H PRN   ??? ondansetron (ZOFRAN) injection 4 mg  4 mg IntraVENous Q4H PRN   ??? prochlorperazine (COMPAZINE) injection 5 mg  5 mg IntraVENous Q8H PRN   ??? heparin (porcine) injection 5,000 Units  5,000 Units SubCUTAneous Q8H   ??? insulin lispro (HUMALOG) injection   SubCUTAneous AC&HS   ???  glucose chewable tablet 16 g  4 Tab Oral PRN   ??? dextrose (D50W) injection Syrg 12.5-25 g  12.5-25 g IntraVENous PRN   ??? glucagon (GLUCAGEN) injection 1 mg  1 mg IntraMUSCular PRN   ??? piperacillin-tazobactam (ZOSYN) 3.375 g in 0.9% sodium chloride (MBP/ADV) 100 mL MBP  3.375 g IntraVENous Q8H   ??? ranolazine ER (RANEXA) tablet 500 mg  500 mg Oral BID   ??? ferrous sulfate tablet 325 mg  1 Tab Oral BID WITH MEALS            Lab Review:     Recent Labs   [facility] 10/04/11 0415 10/03/11 0740 10/02/11 0224    WBC 7.1 7.8 5.2    HGB 9.4* 10.1* 7.2*    HCT 28.1* 29.3* 21.5*    PLT 134* 141* 167     Recent Labs   Basename 10/04/11 0415 10/03/11 0740 10/02/11 0224    NA 138 137 134*    K 4.4 3.8 4.5    CL 102 101 100    CO2 28 26 28     GLU 255* 76 277*    BUN 27* 26* 35*    CREA 1.76* 1.74* 2.28*    CA 7.8* 8.1* 7.6*    MG -- -- 1.7    PHOS -- -- 2.9    ALB -- -- 2.2*    TBIL -- -- 0.5    SGOT -- -- 16    INR -- -- --     Lab Results   Component Value Date/Time    POC GLUCOSE 347 10/04/2011  7:46 AM    POC GLUCOSE 195 10/03/2011  8:47 PM    POC GLUCOSE 115 10/03/2011  4:27 PM    POC GLUCOSE 112 10/03/2011  1:27 PM    POC GLUCOSE 141 10/03/2011 11:30 AM     No results found for this basename: PH:3,PCO2:3,PO2:3,HCO3:3,FIO2:3 in the last 72 hours  No results found for this basename: INR:3 in  the last 72 hours    Lab Results   Component Value Date/Time    Specimen Description: NASAL 10/01/2011 10:58 PM    Specimen Description: FOOT 10/01/2011 10:58 PM    Specimen Description: BLOOD 10/01/2011  8:15 PM    Specimen Description: BLOOD 08/23/2011 10:23 PM     Lab Results   Component Value Date/Time    Culture result:  Value: MRSA NOT PRESENT     Screening of patient nares for MRSA is for surveillance purposes and, if positive, to facilitate isolation considerations in high risk settings. It is not intended for automatic decolonization interventions per se as regimens are not sufficiently effective to warrant routine use. 10/01/2011 10:58 PM    Culture result: NO GROWTH 1 DAY 10/01/2011 10:58 PM    Culture result: NO GROWTH 3 DAYS 10/01/2011  8:15 PM    Culture result: NO GROWTH 5 DAYS 08/23/2011 10:23 PM   Total time spent with patient: 35 minutes                   Care Plan discussed with: Patient, Nursing Staff and >50% of time spent in counseling and coordination of care    Discussed:  Care Plan    Prophylaxis:  Hep SQ    Disposition:  SNF/LTC           ___________________________________________________    Attending Physician: Gaspar Bidding, MD

## 2011-10-04 NOTE — Progress Notes (Signed)
[facility] Progress Note  [facility]   [address] [place], [place] [address]   [phone]      Assessment & Plan:   1. Non-oliguric AKI/CKD3: Improved.   - Cr approaching baseline  - Will continue to follow closely.   2. Left foot OM : bl.cultures neg, on ABx  3. PAD/PVD   4. DM   5. HTN  6. Volume: on Buemx  7. Anemia  - on epo  - check Iron panel         Subjective:   CC:f/up arf,ckd,anemia  HPI: Patient seen   Feels stronger  Anemia stable  ARF is better  ROS: no cp/sob/n/v/abd pain  Current Facility-Administered Medications   Medication Dose Route Frequency   ??? methylPREDNISolone (PF) (SOLU-MEDROL) injection 60 mg  60 mg IntraVENous Q12H   ??? albuterol/ipratropium (DUONEB) neb solution  1 Dose Nebulization Q4HWA RT   ??? pneumococcal 23-valent (PNEUMOVAX 23) injection 0.5 mL  0.5 mL IntraMUSCular PRIOR TO DISCHARGE   ??? vancomycin (VANCOCIN) 1.25 g in 0.9% sodium chloride 250 mL IVPB  1,250 mg IntraVENous Q24H   ??? fluticasone-salmeterol (ADVAIR) 275mcg-50mcg/puff  1 Puff Inhalation BID RT   ??? morphine injection 2 mg  2 mg IntraVENous Q2H PRN   ??? epoetin alfa (EPOGEN;PROCRIT) injection 10,000 Units  10,000 Units SubCUTAneous Q7D   ??? albuterol (PROVENTIL VENTOLIN) nebulizer solution 2.5 mg  2.5 mg Nebulization Q2H PRN   ??? aspirin chewable tablet 81 mg  81 mg Oral DAILY   ??? levothyroxine (SYNTHROID) tablet 150 mcg  150 mcg Oral ACB   ??? nitroglycerin (NITROSTAT) tablet 0.4 mg  0.4 mg SubLINGual Q5MIN PRN   ??? terazosin (HYTRIN) capsule 10 mg  10 mg Oral QHS   ??? misoprostol (CYTOTEC) tablet 200 mcg  200 mcg Oral BID   ??? clopidogrel (PLAVIX) tablet 75 mg  75 mg Oral DAILY   ??? metoprolol (LOPRESSOR) tablet 100 mg  100 mg Oral BID   ??? atorvastatin (LIPITOR) tablet 40 mg  40 mg Oral QHS   ??? isosorbide mononitrate ER (IMDUR) tablet 30 mg  30 mg Oral DAILY   ??? fish oil-omega-3 fatty acids 340-1,000 mg capsule 1 Cap  1 Cap Oral BID   ??? bumetanide (BUMEX)  tablet 1 mg  1 mg Oral DAILY   ??? amLODIPine (NORVASC) tablet 5 mg  5 mg Oral DAILY   ??? donepezil (ARICEPT) tablet 5 mg  5 mg Oral QHS   ??? famotidine (PEPCID) tablet 20 mg  20 mg Oral DAILY   ??? finasteride (PROSCAR) tablet 5 mg  5 mg Oral DAILY   ??? insulin glargine (LANTUS) injection 30 Units  30 Units SubCUTAneous DAILY   ??? acetaminophen (TYLENOL) tablet 650 mg  650 mg Oral Q4H PRN   ??? oxyCODONE-acetaminophen (PERCOCET) 5-325 mg per tablet 1 Tab  1 Tab Oral Q4H PRN   ??? morphine injection 1 mg  1 mg IntraVENous Q3H PRN   ??? naloxone (NARCAN) injection 0.4 mg  0.4 mg IntraVENous PRN   ??? diphenhydrAMINE (BENADRYL) injection 12.5 mg  12.5 mg IntraVENous Q4H PRN   ??? ondansetron (ZOFRAN) injection 4 mg  4 mg IntraVENous Q4H PRN   ??? prochlorperazine (COMPAZINE) injection 5 mg  5 mg IntraVENous Q8H PRN   ??? heparin (porcine) injection 5,000 Units  5,000 Units SubCUTAneous Q8H   ??? insulin lispro (HUMALOG) injection   SubCUTAneous AC&HS   ??? glucose chewable tablet 16 g  4 Tab Oral PRN   ??? dextrose (D50W) injection Syrg 12.5-25 g  12.5-25 g IntraVENous PRN   ??? glucagon (GLUCAGEN) injection 1 mg  1 mg IntraMUSCular PRN   ??? piperacillin-tazobactam (ZOSYN) 3.375 g in 0.9% sodium chloride (MBP/ADV) 100 mL MBP  3.375 g IntraVENous Q8H   ??? ranolazine ER (RANEXA) tablet 500 mg  500 mg Oral BID   ??? ferrous sulfate tablet 325 mg  1 Tab Oral BID WITH MEALS          Objective:     Vitals:  Blood pressure 158/71, pulse 91, temperature 98.2 ??F (36.8 ??C), resp. rate 18, height 5\' 6"  (1.676 m), weight 80.6 kg (177 lb 11.1 oz), SpO2 97.00%.  Temp (24hrs), Avg:98.2 ??F (36.8 ??C), Min:97.4 ??F (36.3 ??C), Max:98.8 ??F (37.1 ??C)      Intake and Output:     05/08 1900 - 05/10 0659  In: 1303.3 [P.O.:600; I.V.:400]  Out: 2250 [Urine:2100]    Physical Exam:                Patient is intubated:  no    Physical Examination:   GENERAL ASSESSMENT: NAD  HEENT:Nontraumatic   CHEST: CTA  HEART: S1S2  ABDOMEN: Soft,NT,     EXTREMITY: EDEMA 1              ECG/rhythm:    Data Review      No results found for this basename: ITNL in the last 72 hours   No results found for this basename: CPK:3,CKMB:3,TROIQ:3, in the last 72 hours  Recent Labs   Basename 10/04/11 0415 10/03/11 0740 10/02/11 0224    NA 138 137 134*    K 4.4 3.8 4.5    CL 102 101 100    CO2 28 26 28     BUN 27* 26* 35*    CREA 1.76* 1.74* 2.28*    GLU 255* 76 277*    PHOS -- -- 2.9    MG -- -- 1.7    CA 7.8* 8.1* 7.6*    ALB -- -- 2.2*    WBC 7.1 7.8 5.2    HGB 9.4* 10.1* 7.2*    HCT 28.1* 29.3* 21.5*    PLT 134* 141* 167      No results found for this basename: INR:3,PTP:3,APTT:3, in the last 72 hours  Needs: urine analysis, urine sodium, protein and creatinine  Lab Results   Component Value Date/Time    Sodium,urine random 29 10/02/2011  9:30 AM    Creatinine,urine random 105.27 10/02/2011  9:30 AM         Discussed with:  Colleague, Nursing, pt          [facility]  [address]  [place], [place] [address]  Phone - [phone]  Fax - [phone]

## 2011-10-04 NOTE — Progress Notes (Signed)
Amputation site looks good  Id keep him on IV antibiotics over weekend  Will remove packing on Monday  Otherwise keep clean dry and covered

## 2011-10-04 NOTE — Progress Notes (Signed)
Tharon Aquas, MD. [facility]  [address]  [place], [place] [address]  Ph [phone];   Fax [phone]      Patient: Travis [name]  DOB: 1941/02/25      Today's Date: 10/04/2011          CARDIOLOGY PROGRESS NOTE  S: No CP or SOb - doing OK post-op  O: Physical Exam:  BP 158/71   Pulse 91   Temp 98.2 ??F (36.8 ??C)   Resp 18   Ht 5\' 6"  (1.676 m)   Wt 177 lb 11.1 oz (80.6 kg)   BMI 28.68 kg/m2   SpO2 97%  Patient appears generally well, mood and affect are appropriate and pleasant.  HEENT:  Normocephalic, atraumatic.   Neck Exam: Supple   Lung Exam: Clear to auscultation, even breath sounds.   Cardiac Exam: Regular rate and rhythm with no murmur   Abdomen: Soft, non-tender, non-distended.  Extremities: right leg bandaged    ROS - no abd pain      Assessment and Plan:  1) CAD  - Mr. Wessler has been stable from a cardiac standpoint  - Continue chronic CAD meds  - Will see him back as outpatient     2) CHF - volume status OK - on daily bumex    3) I will sign off, but please call with any questions      Tharon Aquas, MD, [facility]

## 2011-10-04 NOTE — Progress Notes (Signed)
Pulmonary/Critical Care    Per Beth Hensley's note  He reports improvement in his dyspnea.  Will taper steroids, continue Duoneb and Advair.    Bobby Rumpf MD  [facility]

## 2011-10-04 NOTE — Nurse Consult (Addendum)
Patient admitted with osteomyelitis. EF 45% on echo 2/22, EF 40% by cardiac cath 07/22/11.  On BB. No ACE see Dr. Donnella Bi notation.

## 2011-10-04 NOTE — Progress Notes (Signed)
Bedside and Verbal shift change report given to Jai, RN  (oncoming nurse) by Heather Clay, RN (offgoing nurse).  Report given with SBAR, Kardex, Intake/Output, MAR and Recent Results.

## 2011-10-04 NOTE — Progress Notes (Signed)
[facility] PROGRESS NOTE  Pulmonary, Critical Care, and Sleep Medicine    Name: Travis [name] MRN: [id number]   DOB: 04/07/1941 Hospital: [facility]   Date: 10/04/2011  Admission Date: 10/01/2011     Chart and notes reviewed. Data reviewed. I have evaluated and examined the patient.     Overnight events reviewed:  Afebrile and hemodynamically stable  Doing well s/p amputation of left great toe  Slept well with CPAP    ROS: Patient is feeling better this morning.  States his SOB and wheezing are improved.  Denies fever or chills.  Cough is better.  Denies CP.  Pain control is adequate.     Vital Signs:  BP 151/76   Pulse 77   Temp 98.1 ??F (36.7 ??C)   Resp 18   Ht 5\' 6"  (1.676 m)   Wt 80.6 kg (177 lb 11.1 oz)   BMI 28.68 kg/m2   SpO2 94%    O2 Device: CPAP mask   O2 Flow Rate (L/min): 2 l/min   Temp (24hrs), Avg:98.3 ??F (36.8 ??C), Min:97.4 ??F (36.3 ??C), Max:99.5 ??F (37.5 ??C)       Intake/Output:   Last shift:         Last 3 shifts: 05/08 1900 - 05/10 0659  In: 1303.3 [P.O.:600; I.V.:400]  Out: 2250 [Urine:2100]    Intake/Output Summary (Last 24 hours) at 10/04/11 0936  Last data filed at 10/04/11 3244   Gross per 24 hour   Intake   1000 ml   Output   1400 ml   Net   -400 ml          Physical Exam:   General:  Alert, cooperative, no distress, appears stated age.    Head:  Normocephalic, without obvious abnormality, atraumatic.    Eyes:  Conjunctivae/corneas clear.    Nose:  Nares normal. Septum midline. Mucosa normal.    Throat:  Lips, mucosa, and tongue normal. Class 4 airway.    Neck:  Supple, symmetrical, trachea midline. Subcutaneous mass/swelling in the superior clavicular region on the left consistent with likely lipoma.    Lungs:  Improved air movement today.  Scant expiratory wheezes and faint crackles in the bases.    Chest wall:  No tenderness or deformity.    Heart:  Regular rate and rhythm, S1, S2 normal, no murmur, click, rub or gallop.    Abdomen:  Soft, non-tender.  Bowel sounds normal. No masses, No organomegaly.    Extremities:  LLE with wound wrapped. Left elbow with dressing in place over abrasion s/p fall per patient. There is BLE edema, trace - 1+.    Pulses:  2+ and symmetric all extremities.    Skin:  Dry and peeling, especially the LEs. No obvious rash.    Lymph nodes:  Swelling in the left cervical/ left supraclavicular region. Palpation seems consistent with lipoma.    Neurologic:  Grossly nonfocal          DATA:  MAR reviewed and pertinent medications noted or modified as needed    Labs:  Recent Labs   [facility] 10/04/11 0415 10/03/11 0740 10/02/11 0224    WBC 7.1 7.8 5.2    HGB 9.4* 10.1* 7.2*    HCT 28.1* 29.3* 21.5*    PLT 134* 141* 167     Recent Labs   Basename 10/04/11 0415 10/03/11 0740 10/02/11 0224    NA 138 137 134*    K 4.4 3.8 4.5  CL 102 101 100    CO2 28 26 28     GLU 255* 76 277*    BUN 27* 26* 35*    CREA 1.76* 1.74* 2.28*    CA 7.8* 8.1* 7.6*    MG -- -- 1.7    PHOS -- -- 2.9    ALB -- -- 2.2*    TBIL -- -- 0.5    SGOT -- -- 16    INR -- -- --       Imaging:  I have personally reviewed the patient???s radiographs and reports.    CXR (10/03/11): Bilateral pleural effusions and interstitial pulmonary edema.      IMPRESSION:   COPD Exacerbation   Pulmonary Edema  Osteomyelitis of left great toe s/p amputation  PAD   DM, uncontrolled   Acute/Chronic Renal Failure   Anemia   CAD/CM/CHF/Hx of multiple STEMIs; Cardiac cath 06/2011 (EF 40%); S/P stent and pacemaker   OSA      PLAN:   Continue scheduled Duonebs and wean Solumedrol.   Continue Advair 250/50   Antibiotics per primary team and vascular surgery   Continue Diuresis; fluid balance per Renal   Continue CPAP auto nightly; patient will resume home CPAP upon discharge  GI Prophylaxis: Pepcid   DVT Prophylaxis: subcutaneous heparin         Amedeo Kinsman, PA

## 2011-10-05 LAB — CBC WITH AUTOMATED DIFF
ABS. BASOPHILS: 0 10*3/uL (ref 0.0–0.1)
ABS. EOSINOPHILS: 0 10*3/uL (ref 0.0–0.4)
ABS. LYMPHOCYTES: 0.6 10*3/uL — ABNORMAL LOW (ref 0.8–3.5)
ABS. MONOCYTES: 0.7 10*3/uL (ref 0.0–1.0)
ABS. NEUTROPHILS: 9.4 10*3/uL — ABNORMAL HIGH (ref 1.8–8.0)
BASOPHILS: 0 % (ref 0–1)
EOSINOPHILS: 0 % (ref 0–7)
HCT: 24.9 % — ABNORMAL LOW (ref 36.6–50.3)
HGB: 8.4 g/dL — ABNORMAL LOW (ref 12.1–17.0)
LYMPHOCYTES: 6 % — ABNORMAL LOW (ref 12–49)
MCH: 28.5 PG (ref 26.0–34.0)
MCHC: 33.7 g/dL (ref 30.0–36.5)
MCV: 84.4 FL (ref 80.0–99.0)
MONOCYTES: 6 % (ref 5–13)
NEUTROPHILS: 88 % — ABNORMAL HIGH (ref 32–75)
PLATELET: 142 10*3/uL — ABNORMAL LOW (ref 150–400)
RBC: 2.95 M/uL — ABNORMAL LOW (ref 4.10–5.70)
RDW: 17.8 % — ABNORMAL HIGH (ref 11.5–14.5)
WBC: 10.7 10*3/uL (ref 4.1–11.1)

## 2011-10-05 LAB — VANCOMYCIN, TROUGH
Reported dose date: 20130510
Reported dose time:: 1030
Reported dose:: 1250 UNITS
Vancomycin,trough: 16.1 ug/mL — ABNORMAL HIGH (ref 5.0–10.0)

## 2011-10-05 LAB — METABOLIC PANEL, BASIC
Anion gap: 4 mmol/L — ABNORMAL LOW (ref 5–15)
BUN/Creatinine ratio: 19 (ref 12–20)
BUN: 36 MG/DL — ABNORMAL HIGH (ref 6–20)
CO2: 29 MMOL/L (ref 21–32)
Calcium: 7.4 MG/DL — ABNORMAL LOW (ref 8.5–10.1)
Chloride: 102 MMOL/L (ref 97–108)
Creatinine: 1.85 MG/DL — ABNORMAL HIGH (ref 0.45–1.15)
GFR est AA: 44 mL/min/{1.73_m2} — ABNORMAL LOW (ref >60)
GFR est non-AA: 36 mL/min/{1.73_m2} — ABNORMAL LOW (ref >60)
Glucose: 300 MG/DL — ABNORMAL HIGH (ref 65–100)
Potassium: 4.8 MMOL/L (ref 3.5–5.1)
Sodium: 135 MMOL/L — ABNORMAL LOW (ref 136–145)

## 2011-10-05 LAB — GLUCOSE, POC
Glucose (POC): 275 mg/dL — ABNORMAL HIGH (ref 75–110)
Glucose (POC): 389 mg/dL — ABNORMAL HIGH (ref 75–110)
Glucose (POC): 422 mg/dL — ABNORMAL HIGH (ref 75–110)
Glucose (POC): 396 mg/dL — ABNORMAL HIGH (ref 75–110)

## 2011-10-05 LAB — IRON PROFILE
Iron % saturation: 68 % — ABNORMAL HIGH (ref 20–50)
Iron: 101 ug/dL (ref 35–150)
TIBC: 149 ug/dL — ABNORMAL LOW (ref 250–450)

## 2011-10-05 LAB — MAGNESIUM: Magnesium: 1.7 MG/DL (ref 1.6–2.4)

## 2011-10-05 LAB — FERRITIN: Ferritin: 323 NG/ML (ref 26–388)

## 2011-10-05 MED ORDER — BUMETANIDE 0.25 MG/ML IJ SOLN
0.25 mg/mL | Freq: Every day | INTRAMUSCULAR | Status: DC
Start: 2011-10-05 — End: 2011-10-08
  Administered 2011-10-06 – 2011-10-07 (×2): via INTRAVENOUS

## 2011-10-05 MED ORDER — PREDNISONE 20 MG TAB
20 mg | Freq: Every day | ORAL | Status: DC
Start: 2011-10-05 — End: 2011-10-06
  Administered 2011-10-05 – 2011-10-06 (×2): via ORAL

## 2011-10-05 MED FILL — FISH OIL 340 MG-1,000 MG CAPSULE: 340-1000 mg | ORAL | Qty: 1

## 2011-10-05 MED FILL — SOLU-MEDROL (PF) 125 MG/2 ML SOLUTION FOR INJECTION: 125 mg/2 mL | INTRAMUSCULAR | Qty: 2

## 2011-10-05 MED FILL — PLAVIX 75 MG TABLET: 75 mg | ORAL | Qty: 1

## 2011-10-05 MED FILL — FINASTERIDE 5 MG TAB: 5 mg | ORAL | Qty: 1

## 2011-10-05 MED FILL — SODIUM CHLORIDE 0.9 % IV PIGGY BACK: INTRAVENOUS | Qty: 100

## 2011-10-05 MED FILL — RANEXA 500 MG TABLET,EXTENDED RELEASE: 500 mg | ORAL | Qty: 1

## 2011-10-05 MED FILL — ARICEPT 5 MG TABLET: 5 mg | ORAL | Qty: 1

## 2011-10-05 MED FILL — INSULIN LISPRO 100 UNIT/ML INJECTION: 100 unit/mL | SUBCUTANEOUS | Qty: 1

## 2011-10-05 MED FILL — METOPROLOL TARTRATE 25 MG TAB: 25 mg | ORAL | Qty: 4

## 2011-10-05 MED FILL — PREDNISONE 20 MG TAB: 20 mg | ORAL | Qty: 2

## 2011-10-05 MED FILL — MORPHINE 2 MG/ML INJECTION: 2 mg/mL | INTRAMUSCULAR | Qty: 1

## 2011-10-05 MED FILL — INSULIN LISPRO 100 UNIT/ML INJECTION: 100 unit/mL | SUBCUTANEOUS | Qty: 12

## 2011-10-05 MED FILL — MISOPROSTOL 200 MCG TAB: 200 mcg | ORAL | Qty: 1

## 2011-10-05 MED FILL — AMLODIPINE 5 MG TAB: 5 mg | ORAL | Qty: 1

## 2011-10-05 MED FILL — FERROUS SULFATE 325 MG (65 MG ELEMENTAL IRON) TAB: 325 mg (65 mg iron) | ORAL | Qty: 1

## 2011-10-05 MED FILL — HEPARIN (PORCINE) 5,000 UNIT/ML IJ SOLN: 5000 unit/mL | INTRAMUSCULAR | Qty: 1

## 2011-10-05 MED FILL — IPRATROPIUM BROMIDE 0.02 % SOLN FOR INHALATION: 0.02 % | RESPIRATORY_TRACT | Qty: 2.5

## 2011-10-05 MED FILL — TERAZOSIN 5 MG CAP: 5 mg | ORAL | Qty: 2

## 2011-10-05 MED FILL — FAMOTIDINE 20 MG TAB: 20 mg | ORAL | Qty: 1

## 2011-10-05 MED FILL — NAROPIN (PF) 5 MG/ML (0.5 %) INJECTION SOLUTION: 5 mg/mL (0. %) | INTRAMUSCULAR | Qty: 30

## 2011-10-05 MED FILL — VANCOMYCIN 10 GRAM IV SOLR: 10 gram | INTRAVENOUS | Qty: 1.25

## 2011-10-05 MED FILL — ONDANSETRON (PF) 4 MG/2 ML INJECTION: 4 mg/2 mL | INTRAMUSCULAR | Qty: 2

## 2011-10-05 MED FILL — ASPIRIN 81 MG CHEWABLE TAB: 81 mg | ORAL | Qty: 1

## 2011-10-05 MED FILL — INSULIN GLARGINE 100 UNIT/ML INJECTION: 100 unit/mL | SUBCUTANEOUS | Qty: 0.35

## 2011-10-05 MED FILL — LEVOTHYROXINE 150 MCG TAB: 150 mcg | ORAL | Qty: 1

## 2011-10-05 MED FILL — DIPRIVAN 10 MG/ML INTRAVENOUS EMULSION: 10 mg/mL | INTRAVENOUS | Qty: 20

## 2011-10-05 MED FILL — ISOSORBIDE MONONITRATE SR 30 MG 24 HR TAB: 30 mg | ORAL | Qty: 1

## 2011-10-05 MED FILL — GLYCOPYRROLATE 0.2 MG/ML IJ SOLN: 0.2 mg/mL | INTRAMUSCULAR | Qty: 1

## 2011-10-05 MED FILL — LIPITOR 20 MG TABLET: 20 mg | ORAL | Qty: 2

## 2011-10-05 NOTE — Progress Notes (Signed)
Bedside and Verbal shift change report given to [facility] RN & Debra RN (oncoming nurse) by Laverna Peace RN (offgoing nurse).  Report given with SBAR, Kardex, Intake/Output, MAR and Recent Results.

## 2011-10-05 NOTE — Progress Notes (Signed)
Medical Progress Note      NAME: Travis [name]   DOB:  21-Dec-1940  MRM:  [id number]    Date/Time: 10/05/2011        Principal Problem:   *Osteomyelitis ()  Active Problems:     CAD (coronary artery disease) ()   Cath 1999 distal disease in the apical LAD and right coronary artery with    a normal ejection fraction.   Non q MI in 7/06.cath - mod LAD abn RCA disease up to 50-60% , small    occluded LCX with normal LVEF.     (?NSTEMI) during admission with pneumonia and ARF 05/2009 - proceeded to    have a PCI   (DES) to RCA.     NSTEMI (trop ~30) on 06/21/09 but cath showed stable findings.     NSTEMI with another cath in 2/11 - the stent was patent and no new    findings.     Multiple NSTEMI's in 5/11 - proceeded on 10/16/09 to PCI of 85% ulcerated    plaque mid RCA successfully stented with 2.75 Xience.  CTO Cx successful    PTCA and DES of proximal part with 2.25 Atom.            Hyperlipidemia (10/06/2009)     HTN (hypertension) (10/13/2009)     PAD (peripheral artery disease) (05/18/2011)   PVR tracing 05/18/2011 suggest bilateral SFA occlusion and calf vessel    disease    and high grade stenosis of his proximal fem-pop   Angio March 2012 ([facility]): r sfa-ak pop with prox stenosis, 1 vs r/o; left    diffuse sfa , severe tpt disease     CKD (chronic kidney disease) stage 3, GFR 30-59 ml/min (07/18/2011)     Foot ulcer (07/18/2011)     Anemia of other chronic disease (08/21/2011)     SSS (sick sinus syndrome) (08/27/2011)   07/20/09: RV: [employer] lead, model number 2088, serial number [id number],    RA: [employer] lead, model number 1488TC, serial number [id number],      Generator by [employer], model number 5826, serial number [id number],             Dementia (08/31/2011)     Chronic pain ()   back pain     GERD (gastroesophageal reflux disease) ()     Hypothyroidism ()     Hx of thromboembolism ()   leg     Pulmonary hypertension ()   PASP 60 on echo 06/22/09     CHF (congestive heart failure) ()     COPD (chronic obstructive  pulmonary disease) ()   mild COPD, recurrent pneumonia, chronic bronchitis (followed by Dr. Leroy Libman)     Sleep apnea ()   on CPAP     PVD (peripheral vascular disease) ()     Hyponatremia (10/01/2011)     Hyperkalemia (10/01/2011)     ARF (acute renal failure) (10/01/2011)        Assessment/Plan:   1.  Osteomyelitis / Foot ulcer - s/p L toe amputation on 5/9   -optimize pain control  -continue zosyn/vanc   -blood cultures negative  -wound cultures with no definite organism   -MRSA culture negative   -appreciate vascular's assistance     2.  PAD (peripheral artery disease) / PVD (peripheral vascular disease) - Severe. Vascular evaluation appreciated.     3.  CAD (coronary artery disease) / CHF (congestive heart failure), chronic systolic, EF 40 % - Multiple  STEMIs and stents.   -continue ASA/plavix   -continue Ranexa, beta blockade and imdur   -hold Bumex this AM due to worsening renal function     4.  Acute on chronic renal failure. Renal evaluation appreciated.    -hold Bumex this AM   -strict I's and O's     5.  DM with renal complications / DM foot ulcer / Uncontrolled   -suspect poor control 2/2 steroids; continue Lantus; increase dose   -ISS   -diabetic diet     6.  Anemia of other chronic disease - Transfused 2 units of PRBC. EPO per renal. Continue PO iron'  -check stool blood   -start PPI for GI prophylaxis     7.  SSS (sick sinus syndrome) - Pacer placed.  Follow on tele.     8.  HTN (hypertension) - Continue norvasc, metoprolol     9.  Dementia - Stable and mild. Continue aricept.     10.  GERD (gastroesophageal reflux disease) - Continue PPI      11.  Hypothyroidism - Continue synthroid     12.  COPD (chronic obstructive pulmonary disease) with exacerbation- Hx recurrent pneumonia, chronic bronchitis (followed by Dr. Leroy Libman)  -CXR with ?patchy bibasilar PNA though pt has been on zosyn/vanco since admission; suspect this is likely also 2/2 volume overload. Diuresis limited by rising creatinine   -continue duonebs;  taper steroids considering no active wheezing   -resumed Advair   -continue home CPAP   -appreciate pulmonary's assistance     13.  Sleep apnea - on CPAP     14.  Hyperlipidemia - Continue fish oil and lipitor     15.  BPH- Continue Proscar and terazosin       Subjective:     Chief Complaint:  "I feel okay, can I get out of bed?"    No complaints this AM. Pain controlled. Eager to get out of bed. SOB improved. No chest pain. Tolerating diet.     ROS:  (bold if positive, if negative)    SOB/DOE;improved  Tolerating PT  Tolerating Diet  Foot pain           Objective:       Vitals:          Last 24hrs VS reviewed since prior progress note. Most recent are:    Visit Vitals   Item Reading   ??? BP 166/74   ??? Pulse 79   ??? Temp 98.1 ??F (36.7 ??C)   ??? Resp 18   ??? Ht 5\' 6"  (1.676 m)   ??? Wt 177 lb 11.1 oz   ??? BMI 28.68 kg/m2   ??? SpO2 86%     SpO2 Readings from Last 6 Encounters:   10/05/11 86%   10/05/11 86%   09/03/11 95%   08/13/11 99%   08/13/11 99%   07/24/11 98%    O2 Flow Rate (L/min): 2 l/min       Intake/Output Summary (Last 24 hours) at 10/05/11 0933  Last data filed at 10/05/11 0751   Gross per 24 hour   Intake    560 ml   Output    825 ml   Net   -265 ml          Exam:     Physical Exam:    Gen:  Well-developed, well-nourished, in no acute distress  HEENT:  Pink conjunctivae, PERRL, hearing intact to voice, moist mucous membranes  Neck:  Supple, without  masses, thyroid non-tender  Resp:  Good air movement. No wheezing appreciated  Card:  No murmurs, normal S1, S2 without thrills, bruits or peripheral edema  Abd:  Soft, non-tender, non-distended, normoactive bowel sounds are present, no palpable organomegaly and no detectable hernias  Lymph:  No cervical or inguinal adenopathy  Musc:  No cyanosis or clubbing  Skin:  To gangrene and non-healing wound  Neuro:  Cranial nerves are grossly intact, no focal motor weakness, follows commands appropriately  Psych:  Good insight, oriented to person, place and time, alert  L  elbow skin tear (POA): superficial. Nontender. Non-erythematous.   L foot C/D/I    Medications Reviewed: (see below)    Lab Data Reviewed: (see below)    ______________________________________________________________________    Medications:     Current Facility-Administered Medications   Medication Dose Route Frequency   ??? bumetanide (BUMEX) injection 1 mg  1 mg IntraVENous DAILY   ??? predniSONE (DELTASONE) tablet 40 mg  40 mg Oral DAILY WITH BREAKFAST   ??? Vancomycin trough at 0930 10/05/11  1 Each Other ONCE   ??? insulin glargine (LANTUS) injection 35 Units  35 Units SubCUTAneous DAILY   ??? insulin lispro (HUMALOG) injection   SubCUTAneous AC&HS   ??? albuterol/ipratropium (DUONEB) neb solution  1 Dose Nebulization Q4HWA RT   ??? pneumococcal 23-valent (PNEUMOVAX 23) injection 0.5 mL  0.5 mL IntraMUSCular PRIOR TO DISCHARGE   ??? vancomycin (VANCOCIN) 1.25 g in 0.9% sodium chloride 250 mL IVPB  1,250 mg IntraVENous Q24H   ??? fluticasone-salmeterol (ADVAIR) 260mcg-50mcg/puff  1 Puff Inhalation BID RT   ??? morphine injection 2 mg  2 mg IntraVENous Q2H PRN   ??? epoetin alfa (EPOGEN;PROCRIT) injection 10,000 Units  10,000 Units SubCUTAneous Q7D   ??? albuterol (PROVENTIL VENTOLIN) nebulizer solution 2.5 mg  2.5 mg Nebulization Q2H PRN   ??? aspirin chewable tablet 81 mg  81 mg Oral DAILY   ??? levothyroxine (SYNTHROID) tablet 150 mcg  150 mcg Oral ACB   ??? nitroglycerin (NITROSTAT) tablet 0.4 mg  0.4 mg SubLINGual Q5MIN PRN   ??? terazosin (HYTRIN) capsule 10 mg  10 mg Oral QHS   ??? misoprostol (CYTOTEC) tablet 200 mcg  200 mcg Oral BID   ??? clopidogrel (PLAVIX) tablet 75 mg  75 mg Oral DAILY   ??? metoprolol (LOPRESSOR) tablet 100 mg  100 mg Oral BID   ??? atorvastatin (LIPITOR) tablet 40 mg  40 mg Oral QHS   ??? isosorbide mononitrate ER (IMDUR) tablet 30 mg  30 mg Oral DAILY   ??? fish oil-omega-3 fatty acids 340-1,000 mg capsule 1 Cap  1 Cap Oral BID   ??? amLODIPine (NORVASC) tablet 5 mg  5 mg Oral DAILY   ??? donepezil (ARICEPT) tablet 5 mg  5 mg  Oral QHS   ??? famotidine (PEPCID) tablet 20 mg  20 mg Oral DAILY   ??? finasteride (PROSCAR) tablet 5 mg  5 mg Oral DAILY   ??? acetaminophen (TYLENOL) tablet 650 mg  650 mg Oral Q4H PRN   ??? oxyCODONE-acetaminophen (PERCOCET) 5-325 mg per tablet 1 Tab  1 Tab Oral Q4H PRN   ??? morphine injection 1 mg  1 mg IntraVENous Q3H PRN   ??? naloxone (NARCAN) injection 0.4 mg  0.4 mg IntraVENous PRN   ??? diphenhydrAMINE (BENADRYL) injection 12.5 mg  12.5 mg IntraVENous Q4H PRN   ??? ondansetron (ZOFRAN) injection 4 mg  4 mg IntraVENous Q4H PRN   ??? prochlorperazine (COMPAZINE) injection 5 mg  5  mg IntraVENous Q8H PRN   ??? heparin (porcine) injection 5,000 Units  5,000 Units SubCUTAneous Q8H   ??? glucose chewable tablet 16 g  4 Tab Oral PRN   ??? dextrose (D50W) injection Syrg 12.5-25 g  12.5-25 g IntraVENous PRN   ??? glucagon (GLUCAGEN) injection 1 mg  1 mg IntraMUSCular PRN   ??? piperacillin-tazobactam (ZOSYN) 3.375 g in 0.9% sodium chloride (MBP/ADV) 100 mL MBP  3.375 g IntraVENous Q8H   ??? ranolazine ER (RANEXA) tablet 500 mg  500 mg Oral BID   ??? ferrous sulfate tablet 325 mg  1 Tab Oral BID WITH MEALS            Lab Review:     Recent Labs   [facility] 10/05/11 0403 10/04/11 0415 10/03/11 0740    WBC 10.7 7.1 7.8    HGB 8.4* 9.4* 10.1*    HCT 24.9* 28.1* 29.3*    PLT 142* 134* 141*     Recent Labs   Basename 10/05/11 0403 10/04/11 0415 10/03/11 0740    NA 135* 138 137    K 4.8 4.4 3.8    CL 102 102 101    CO2 29 28 26     GLU 300* 255* 76    BUN 36* 27* 26*    CREA 1.85* 1.76* 1.74*    CA 7.4* 7.8* 8.1*    MG 1.7 -- --    PHOS -- -- --    ALB -- -- --    TBIL -- -- --    SGOT -- -- --    INR -- -- --     Lab Results   Component Value Date/Time    POC GLUCOSE 389 10/05/2011  7:46 AM    POC GLUCOSE 275 10/04/2011  8:32 PM    POC GLUCOSE 257 10/04/2011  4:34 PM    POC GLUCOSE 456 10/04/2011 11:23 AM    POC GLUCOSE 347 10/04/2011  7:46 AM     No results found for this basename: PH:3,PCO2:3,PO2:3,HCO3:3,FIO2:3 in the last 72 hours  No results found for  this basename: INR:3 in the last 72 hours    Lab Results   Component Value Date/Time    Specimen Description: NASAL 10/01/2011 10:58 PM    Specimen Description: FOOT 10/01/2011 10:58 PM    Specimen Description: BLOOD 10/01/2011  8:15 PM    Specimen Description: BLOOD 08/23/2011 10:23 PM     Lab Results   Component Value Date/Time    Culture result:  Value: MRSA NOT PRESENT     Screening of patient nares for MRSA is for surveillance purposes and, if positive, to facilitate isolation considerations in high risk settings. It is not intended for automatic decolonization interventions per se as regimens are not sufficiently effective to warrant routine use. 10/01/2011 10:58 PM    Culture result: NO GROWTH 2 DAYS 10/01/2011 10:58 PM    Culture result: NO GROWTH 4 DAYS 10/01/2011  8:15 PM    Culture result: NO GROWTH 5 DAYS 08/23/2011 10:23 PM     Total time spent with patient: 35 minutes                   Care Plan discussed with: Patient, Nursing Staff and >50% of time spent in counseling and coordination of care    Discussed:  Care Plan    Prophylaxis:  Hep SQ    Disposition:  SNF/LTC           ___________________________________________________    Attending  Physician: Larinda Buttery, MD

## 2011-10-05 NOTE — Progress Notes (Signed)
Notified Dr. Sudie Bailey of pt's blood glucose of 422. Received order for humalog 12 units subcutaneous as coverage.

## 2011-10-05 NOTE — Progress Notes (Signed)
Bedside and Verbal shift change report given to Shakisa-RN (oncoming nurse) by Gigi Gin RN (offgoing nurse). Report given with SBAR, Kardex, MAR and Recent Results.

## 2011-10-06 LAB — METABOLIC PANEL, BASIC
Anion gap: 6 mmol/L (ref 5–15)
BUN/Creatinine ratio: 20 (ref 12–20)
BUN: 29 MG/DL — ABNORMAL HIGH (ref 6–20)
CO2: 29 MMOL/L (ref 21–32)
Calcium: 7.4 MG/DL — ABNORMAL LOW (ref 8.5–10.1)
Chloride: 105 MMOL/L (ref 97–108)
Creatinine: 1.46 MG/DL — ABNORMAL HIGH (ref 0.45–1.15)
GFR est AA: 58 mL/min/{1.73_m2} — ABNORMAL LOW (ref >60)
GFR est non-AA: 47 mL/min/{1.73_m2} — ABNORMAL LOW (ref >60)
Glucose: 181 MG/DL — ABNORMAL HIGH (ref 65–100)
Potassium: 4 MMOL/L (ref 3.5–5.1)
Sodium: 140 MMOL/L (ref 136–145)

## 2011-10-06 LAB — CULTURE, BLOOD, PAIRED: Culture result:: NO GROWTH

## 2011-10-06 LAB — CBC WITH AUTOMATED DIFF
ABS. BASOPHILS: 0 10*3/uL (ref 0.0–0.1)
ABS. EOSINOPHILS: 0.1 10*3/uL (ref 0.0–0.4)
ABS. LYMPHOCYTES: 1.3 10*3/uL (ref 0.8–3.5)
ABS. MONOCYTES: 1.2 10*3/uL — ABNORMAL HIGH (ref 0.0–1.0)
ABS. NEUTROPHILS: 12.3 10*3/uL — ABNORMAL HIGH (ref 1.8–8.0)
BASOPHILS: 0 % (ref 0–1)
EOSINOPHILS: 1 % (ref 0–7)
HCT: 27.8 % — ABNORMAL LOW (ref 36.6–50.3)
HGB: 9.3 g/dL — ABNORMAL LOW (ref 12.1–17.0)
LYMPHOCYTES: 9 % — ABNORMAL LOW (ref 12–49)
MCH: 28.1 PG (ref 26.0–34.0)
MCHC: 33.5 g/dL (ref 30.0–36.5)
MCV: 84 FL (ref 80.0–99.0)
MONOCYTES: 8 % (ref 5–13)
NEUTROPHILS: 82 % — ABNORMAL HIGH (ref 32–75)
PLATELET: 107 10*3/uL — ABNORMAL LOW (ref 150–400)
RBC: 3.31 M/uL — ABNORMAL LOW (ref 4.10–5.70)
RDW: 17.6 % — ABNORMAL HIGH (ref 11.5–14.5)
WBC: 14.9 10*3/uL — ABNORMAL HIGH (ref 4.1–11.1)

## 2011-10-06 LAB — GLUCOSE, POC
Glucose (POC): 359 mg/dL — ABNORMAL HIGH (ref 75–110)
Glucose (POC): 197 mg/dL — ABNORMAL HIGH (ref 75–110)
Glucose (POC): 256 mg/dL — ABNORMAL HIGH (ref 75–110)
Glucose (POC): 249 mg/dL — ABNORMAL HIGH (ref 75–110)

## 2011-10-06 LAB — MAGNESIUM: Magnesium: 1.6 MG/DL (ref 1.6–2.4)

## 2011-10-06 MED ORDER — IPRATROPIUM BROMIDE 0.02 % SOLN FOR INHALATION
0.02 % | Freq: Four times a day (QID) | RESPIRATORY_TRACT | Status: DC | PRN
Start: 2011-10-06 — End: 2011-10-09

## 2011-10-06 MED ORDER — PREDNISONE 20 MG TAB
20 mg | Freq: Every day | ORAL | Status: DC
Start: 2011-10-06 — End: 2011-10-09
  Administered 2011-10-07 – 2011-10-09 (×3): via ORAL

## 2011-10-06 MED FILL — FISH OIL 340 MG-1,000 MG CAPSULE: 340-1000 mg | ORAL | Qty: 1

## 2011-10-06 MED FILL — BUMETANIDE 0.25 MG/ML IJ SOLN: 0.25 mg/mL | INTRAMUSCULAR | Qty: 4

## 2011-10-06 MED FILL — METOPROLOL TARTRATE 25 MG TAB: 25 mg | ORAL | Qty: 4

## 2011-10-06 MED FILL — MORPHINE 2 MG/ML INJECTION: 2 mg/mL | INTRAMUSCULAR | Qty: 1

## 2011-10-06 MED FILL — ASPIRIN 81 MG CHEWABLE TAB: 81 mg | ORAL | Qty: 1

## 2011-10-06 MED FILL — LEVOTHYROXINE 150 MCG TAB: 150 mcg | ORAL | Qty: 1

## 2011-10-06 MED FILL — ARICEPT 5 MG TABLET: 5 mg | ORAL | Qty: 1

## 2011-10-06 MED FILL — RANEXA 500 MG TABLET,EXTENDED RELEASE: 500 mg | ORAL | Qty: 1

## 2011-10-06 MED FILL — IPRATROPIUM BROMIDE 0.02 % SOLN FOR INHALATION: 0.02 % | RESPIRATORY_TRACT | Qty: 2.5

## 2011-10-06 MED FILL — AMLODIPINE 5 MG TAB: 5 mg | ORAL | Qty: 1

## 2011-10-06 MED FILL — INSULIN LISPRO 100 UNIT/ML INJECTION: 100 unit/mL | SUBCUTANEOUS | Qty: 1

## 2011-10-06 MED FILL — MISOPROSTOL 200 MCG TAB: 200 mcg | ORAL | Qty: 1

## 2011-10-06 MED FILL — HEPARIN (PORCINE) 5,000 UNIT/ML IJ SOLN: 5000 unit/mL | INTRAMUSCULAR | Qty: 1

## 2011-10-06 MED FILL — FERROUS SULFATE 325 MG (65 MG ELEMENTAL IRON) TAB: 325 mg (65 mg iron) | ORAL | Qty: 1

## 2011-10-06 MED FILL — PREDNISONE 20 MG TAB: 20 mg | ORAL | Qty: 2

## 2011-10-06 MED FILL — INSULIN GLARGINE 100 UNIT/ML INJECTION: 100 unit/mL | SUBCUTANEOUS | Qty: 0.35

## 2011-10-06 MED FILL — SODIUM CHLORIDE 0.9 % IV PIGGY BACK: INTRAVENOUS | Qty: 100

## 2011-10-06 MED FILL — TERAZOSIN 5 MG CAP: 5 mg | ORAL | Qty: 2

## 2011-10-06 MED FILL — ISOSORBIDE MONONITRATE SR 30 MG 24 HR TAB: 30 mg | ORAL | Qty: 1

## 2011-10-06 MED FILL — LIPITOR 20 MG TABLET: 20 mg | ORAL | Qty: 2

## 2011-10-06 MED FILL — FAMOTIDINE 20 MG TAB: 20 mg | ORAL | Qty: 1

## 2011-10-06 MED FILL — FINASTERIDE 5 MG TAB: 5 mg | ORAL | Qty: 1

## 2011-10-06 MED FILL — VANCOMYCIN 10 GRAM IV SOLR: 10 gram | INTRAVENOUS | Qty: 1.25

## 2011-10-06 MED FILL — PLAVIX 75 MG TABLET: 75 mg | ORAL | Qty: 1

## 2011-10-06 NOTE — Progress Notes (Signed)
Bedside and Verbal shift change report given to Minna Antis, Charity fundraiser (oncoming nurse) by Stanton Kidney, RN (offgoing nurse).  Report given with SBAR, Kardex, MAR and Recent Results.

## 2011-10-06 NOTE — Progress Notes (Signed)
Spoke with Dr. Sudie Bailey about down- trending Hgb's. Also discussed severe bleeding from surgical site, that 6 am Heparin subc was held and blood was sent to lab "on hold" for CBC and Chemistry.

## 2011-10-06 NOTE — Progress Notes (Signed)
Medical Progress Note      NAME: Travis [name]   DOB:  25-Nov-1940  MRM:  [id number]    Date/Time: 10/06/2011        Principal Problem:   *Osteomyelitis ()  Active Problems:     CAD (coronary artery disease) ()   Cath 1999 distal disease in the apical LAD and right coronary artery with    a normal ejection fraction.   Non q MI in 7/06.cath - mod LAD abn RCA disease up to 50-60% , small    occluded LCX with normal LVEF.     (?NSTEMI) during admission with pneumonia and ARF 05/2009 - proceeded to    have a PCI   (DES) to RCA.     NSTEMI (trop ~30) on 06/21/09 but cath showed stable findings.     NSTEMI with another cath in 2/11 - the stent was patent and no new    findings.     Multiple NSTEMI's in 5/11 - proceeded on 10/16/09 to PCI of 85% ulcerated    plaque mid RCA successfully stented with 2.75 Xience.  CTO Cx successful    PTCA and DES of proximal part with 2.25 Atom.            Hyperlipidemia (10/06/2009)     HTN (hypertension) (10/13/2009)     PAD (peripheral artery disease) (05/18/2011)   PVR tracing 05/18/2011 suggest bilateral SFA occlusion and calf vessel    disease    and high grade stenosis of his proximal fem-pop   Angio March 2012 ([facility]): r sfa-ak pop with prox stenosis, 1 vs r/o; left    diffuse sfa , severe tpt disease     CKD (chronic kidney disease) stage 3, GFR 30-59 ml/min (07/18/2011)     Foot ulcer (07/18/2011)     Anemia of other chronic disease (08/21/2011)     SSS (sick sinus syndrome) (08/27/2011)   07/20/09: RV: [employer] lead, model number 2088, serial number [id number],    RA: [employer] lead, model number 1488TC, serial number [id number],      Generator by [employer], model number 5826, serial number [id number],             Dementia (08/31/2011)     Chronic pain ()   back pain     GERD (gastroesophageal reflux disease) ()     Hypothyroidism ()     Hx of thromboembolism ()   leg     Pulmonary hypertension ()   PASP 60 on echo 06/22/09     CHF (congestive heart failure) ()     COPD (chronic obstructive  pulmonary disease) ()   mild COPD, recurrent pneumonia, chronic bronchitis (followed by Dr. Leroy Libman)     Sleep apnea ()   on CPAP     PVD (peripheral vascular disease) ()     Hyponatremia (10/01/2011)     Hyperkalemia (10/01/2011)     ARF (acute renal failure) (10/01/2011)        Assessment/Plan:   1.  Osteomyelitis / Foot ulcer - s/p L toe amputation on 5/9   -optimize pain control  -continue zosyn/vanc   -blood cultures negative  -wound cultures with no definite organism   -MRSA culture negative   -appreciate vascular's assistance     2.  PAD (peripheral artery disease) / PVD (peripheral vascular disease) - Severe. Vascular evaluation appreciated.     3.  CAD (coronary artery disease) / CHF (congestive heart failure), chronic systolic, EF 40 % - Multiple  STEMIs and stents.   -continue ASA/plavix   -continue Ranexa, beta blockade and imdur   -resume Bumex as creatinine improved this AM     4.  Acute on chronic renal failure. Renal evaluation appreciated.    -resume Bumex   -strict I's and O's     5.  DM with renal complications / DM foot ulcer / Uncontrolled   -suspect poor control 2/2 steroids; continue Lantus; increased dose; monitor closely as steroids are tapers   -ISS   -diabetic diet     6.  Anemia of other chronic disease - Transfused 2 units of PRBC. EPO per renal. Continue PO iron  -check stool blood   -start PPI for GI prophylaxis   -suspect pt has lost some blood from surgical site as reported by nursing; requiring frequent dressing changes due to bleeding    7.  SSS (sick sinus syndrome) - Pacer placed.  Follow on tele.     8.  HTN (hypertension) - Continue norvasc, metoprolol     9.  Dementia - Stable and mild. Continue aricept.     10.  GERD (gastroesophageal reflux disease) - Continue PPI      11.  Hypothyroidism - Continue synthroid     12.  COPD (chronic obstructive pulmonary disease) with exacerbation- Hx recurrent pneumonia, chronic bronchitis (followed by Dr. Leroy Libman)  -CXR with ?patchy bibasilar PNA though  pt has been on zosyn/vanco since admission; suspect this is likely also 2/2 volume overload. Continuing diuresis  -continue duonebs; taper steroids considering no active wheezing   -resumed Advair   -continue home CPAP   -appreciate pulmonary's assistance     13.  Sleep apnea - on CPAP     14.  Hyperlipidemia - Continue fish oil and lipitor     15.  BPH- Continue Proscar and terazosin       Subjective:     Chief Complaint:  "I feel good"    No complaints this AM. Pain controlled. SOB improving. Per RN, report, pt requiring frequent dressing changes 2/2 surgical site bleeding    ROS:  (bold if positive, if negative)    SOB/DOE;improved  Tolerating PT  Tolerating Diet  Foot pain           Objective:       Vitals:          Last 24hrs VS reviewed since prior progress note. Most recent are:    Visit Vitals   Item Reading   ??? BP 174/79   ??? Pulse 88   ??? Temp 98.2 ??F (36.8 ??C)   ??? Resp 22   ??? Ht 5\' 6"  (1.676 m)   ??? Wt 177 lb 11.1 oz   ??? BMI 28.68 kg/m2   ??? SpO2 90%     SpO2 Readings from Last 6 Encounters:   10/06/11 90%   10/06/11 90%   09/03/11 95%   08/13/11 99%   08/13/11 99%   07/24/11 98%    O2 Flow Rate (L/min): 2 l/min       Intake/Output Summary (Last 24 hours) at 10/06/11 0852  Last data filed at 10/06/11 5956   Gross per 24 hour   Intake    720 ml   Output   1350 ml   Net   -630 ml          Exam:     Physical Exam:    Gen:  Well-developed, well-nourished, in no acute distress  HEENT:  Pink conjunctivae, PERRL,  hearing intact to voice, moist mucous membranes  Neck:  Supple, without masses, thyroid non-tender  Resp:  Good air movement. No wheezing appreciated  Card:  No murmurs, normal S1, S2 without thrills, bruits or peripheral edema  Abd:  Soft, non-tender, non-distended, normoactive bowel sounds are present, no palpable organomegaly and no detectable hernias  Lymph:  No cervical or inguinal adenopathy  Musc:  No cyanosis or clubbing  Skin:  To gangrene and non-healing wound  Neuro:  Cranial nerves are grossly  intact, no focal motor weakness, follows commands appropriately  Psych:  Good insight, oriented to person, place and time, alert  L elbow skin tear (POA): superficial. Nontender. Non-erythematous.   L foot C/D/I    Medications Reviewed: (see below)    Lab Data Reviewed: (see below)    ______________________________________________________________________    Medications:     Current Facility-Administered Medications   Medication Dose Route Frequency   ??? bumetanide (BUMEX) injection 1 mg  1 mg IntraVENous DAILY   ??? predniSONE (DELTASONE) tablet 40 mg  40 mg Oral DAILY WITH BREAKFAST   ??? Vancomycin trough at 0930 10/05/11  1 Each Other ONCE   ??? insulin glargine (LANTUS) injection 35 Units  35 Units SubCUTAneous DAILY   ??? insulin lispro (HUMALOG) injection   SubCUTAneous AC&HS   ??? albuterol/ipratropium (DUONEB) neb solution  1 Dose Nebulization Q4HWA RT   ??? pneumococcal 23-valent (PNEUMOVAX 23) injection 0.5 mL  0.5 mL IntraMUSCular PRIOR TO DISCHARGE   ??? vancomycin (VANCOCIN) 1.25 g in 0.9% sodium chloride 250 mL IVPB  1,250 mg IntraVENous Q24H   ??? fluticasone-salmeterol (ADVAIR) 261mcg-50mcg/puff  1 Puff Inhalation BID RT   ??? morphine injection 2 mg  2 mg IntraVENous Q2H PRN   ??? epoetin alfa (EPOGEN;PROCRIT) injection 10,000 Units  10,000 Units SubCUTAneous Q7D   ??? albuterol (PROVENTIL VENTOLIN) nebulizer solution 2.5 mg  2.5 mg Nebulization Q2H PRN   ??? aspirin chewable tablet 81 mg  81 mg Oral DAILY   ??? levothyroxine (SYNTHROID) tablet 150 mcg  150 mcg Oral ACB   ??? nitroglycerin (NITROSTAT) tablet 0.4 mg  0.4 mg SubLINGual Q5MIN PRN   ??? terazosin (HYTRIN) capsule 10 mg  10 mg Oral QHS   ??? misoprostol (CYTOTEC) tablet 200 mcg  200 mcg Oral BID   ??? clopidogrel (PLAVIX) tablet 75 mg  75 mg Oral DAILY   ??? metoprolol (LOPRESSOR) tablet 100 mg  100 mg Oral BID   ??? atorvastatin (LIPITOR) tablet 40 mg  40 mg Oral QHS   ??? isosorbide mononitrate ER (IMDUR) tablet 30 mg  30 mg Oral DAILY   ??? fish oil-omega-3 fatty acids  340-1,000 mg capsule 1 Cap  1 Cap Oral BID   ??? amLODIPine (NORVASC) tablet 5 mg  5 mg Oral DAILY   ??? donepezil (ARICEPT) tablet 5 mg  5 mg Oral QHS   ??? famotidine (PEPCID) tablet 20 mg  20 mg Oral DAILY   ??? finasteride (PROSCAR) tablet 5 mg  5 mg Oral DAILY   ??? acetaminophen (TYLENOL) tablet 650 mg  650 mg Oral Q4H PRN   ??? oxyCODONE-acetaminophen (PERCOCET) 5-325 mg per tablet 1 Tab  1 Tab Oral Q4H PRN   ??? morphine injection 1 mg  1 mg IntraVENous Q3H PRN   ??? naloxone (NARCAN) injection 0.4 mg  0.4 mg IntraVENous PRN   ??? diphenhydrAMINE (BENADRYL) injection 12.5 mg  12.5 mg IntraVENous Q4H PRN   ??? ondansetron (ZOFRAN) injection 4 mg  4 mg IntraVENous  Q4H PRN   ??? prochlorperazine (COMPAZINE) injection 5 mg  5 mg IntraVENous Q8H PRN   ??? glucose chewable tablet 16 g  4 Tab Oral PRN   ??? dextrose (D50W) injection Syrg 12.5-25 g  12.5-25 g IntraVENous PRN   ??? glucagon (GLUCAGEN) injection 1 mg  1 mg IntraMUSCular PRN   ??? piperacillin-tazobactam (ZOSYN) 3.375 g in 0.9% sodium chloride (MBP/ADV) 100 mL MBP  3.375 g IntraVENous Q8H   ??? ranolazine ER (RANEXA) tablet 500 mg  500 mg Oral BID   ??? ferrous sulfate tablet 325 mg  1 Tab Oral BID WITH MEALS            Lab Review:     Recent Labs   [facility] 10/05/11 0403 10/04/11 0415    WBC 10.7 7.1    HGB 8.4* 9.4*    HCT 24.9* 28.1*    PLT 142* 134*     Recent Labs   Basename 10/06/11 0645 10/05/11 0403 10/04/11 0415    NA 140 135* 138    K 4.0 4.8 4.4    CL 105 102 102    CO2 29 29 28     GLU 181* 300* 255*    BUN 29* 36* 27*    CREA 1.46* 1.85* 1.76*    CA 7.4* 7.4* 7.8*    MG 1.6 1.7 --    PHOS -- -- --    ALB -- -- --    TBIL -- -- --    SGOT -- -- --    INR -- -- --     Lab Results   Component Value Date/Time    POC GLUCOSE 197 10/06/2011  8:01 AM    POC GLUCOSE 359 10/05/2011  8:57 PM    POC GLUCOSE 396 10/05/2011  4:31 PM    POC GLUCOSE 422 10/05/2011 12:28 PM    POC GLUCOSE 389 10/05/2011  7:46 AM     No results found for this basename: PH:3,PCO2:3,PO2:3,HCO3:3,FIO2:3 in the last  72 hours  No results found for this basename: INR:3 in the last 72 hours    Lab Results   Component Value Date/Time    Specimen Description: NASAL 10/01/2011 10:58 PM    Specimen Description: FOOT 10/01/2011 10:58 PM    Specimen Description: BLOOD 10/01/2011  8:15 PM    Specimen Description: BLOOD 08/23/2011 10:23 PM     Lab Results   Component Value Date/Time    Culture result:  Value: MRSA NOT PRESENT     Screening of patient nares for MRSA is for surveillance purposes and, if positive, to facilitate isolation considerations in high risk settings. It is not intended for automatic decolonization interventions per se as regimens are not sufficiently effective to warrant routine use. 10/01/2011 10:58 PM    Culture result: NO GROWTH 2 DAYS 10/01/2011 10:58 PM    Culture result: NO GROWTH 5 DAYS 10/01/2011  8:15 PM    Culture result: NO GROWTH 5 DAYS 08/23/2011 10:23 PM     Total time spent with patient: 35 minutes                   Care Plan discussed with: Patient, Nursing Staff and >50% of time spent in counseling and coordination of care    Discussed:  Care Plan    Prophylaxis:  Hep SQ    Disposition:  SNF/LTC           ___________________________________________________    Attending Physician: Gaspar Bidding, MD

## 2011-10-06 NOTE — Progress Notes (Signed)
Bedside and Verbal shift change report given to Tiara,RN (oncoming nurse) by Lorna,RN (offgoing nurse).  Report given with SBAR, Kardex, Intake/Output, MAR, Accordion and Recent Results.

## 2011-10-06 NOTE — Progress Notes (Signed)
Called Dr.Omuria to advise HGB has been dropping. Inquired regarding the need of a CBC this AM. No orders received.

## 2011-10-07 LAB — NUCLEATED RBC
ABSOLUTE NRBC: 0.13 10*3/uL — ABNORMAL HIGH (ref 0.00–0.01)
NRBC: 1.1 PER 100 WBC — ABNORMAL HIGH

## 2011-10-07 LAB — GLUCOSE, POC
Glucose (POC): 115 mg/dL — ABNORMAL HIGH (ref 75–110)
Glucose (POC): 172 mg/dL — ABNORMAL HIGH (ref 75–110)
Glucose (POC): 205 mg/dL — ABNORMAL HIGH (ref 75–110)
Glucose (POC): 281 mg/dL — ABNORMAL HIGH (ref 75–110)
Glucose (POC): 317 mg/dL — ABNORMAL HIGH (ref 75–110)

## 2011-10-07 LAB — METABOLIC PANEL, BASIC
Anion gap: 6 mmol/L (ref 5–15)
BUN/Creatinine ratio: 18 (ref 12–20)
BUN: 22 MG/DL — ABNORMAL HIGH (ref 6–20)
CO2: 29 MMOL/L (ref 21–32)
Calcium: 7.9 MG/DL — ABNORMAL LOW (ref 8.5–10.1)
Chloride: 104 MMOL/L (ref 97–108)
Creatinine: 1.24 MG/DL — ABNORMAL HIGH (ref 0.45–1.15)
GFR est AA: >60 mL/min/{1.73_m2} (ref >60)
GFR est non-AA: 57 mL/min/{1.73_m2} — ABNORMAL LOW (ref >60)
Glucose: 181 MG/DL — ABNORMAL HIGH (ref 65–100)
Potassium: 3.8 MMOL/L (ref 3.5–5.1)
Sodium: 139 MMOL/L (ref 136–145)

## 2011-10-07 LAB — EKG, 12 LEAD, INITIAL
Atrial Rate: 91 {beats}/min
Calculated P Axis: 33 degrees
Calculated R Axis: 71 degrees
Calculated T Axis: -47 degrees
Diagnosis: NORMAL
P-R Interval: 204 ms
Q-T Interval: 386 ms
QRS Duration: 122 ms
QTC Calculation (Bezet): 474 ms
Ventricular Rate: 91 {beats}/min

## 2011-10-07 LAB — CBC WITH AUTOMATED DIFF
ABS. BASOPHILS: 0 10*3/uL (ref 0.0–0.1)
ABS. EOSINOPHILS: 0 10*3/uL (ref 0.0–0.4)
ABS. LYMPHOCYTES: 1.2 10*3/uL (ref 0.8–3.5)
ABS. MONOCYTES: 1 10*3/uL (ref 0.0–1.0)
ABS. NEUTROPHILS: 9.3 10*3/uL — ABNORMAL HIGH (ref 1.8–8.0)
BASOPHILS: 0 % (ref 0–1)
EOSINOPHILS: 0 % (ref 0–7)
HCT: 23.8 % — ABNORMAL LOW (ref 36.6–50.3)
HGB: 8 g/dL — ABNORMAL LOW (ref 12.1–17.0)
LYMPHOCYTES: 10 % — ABNORMAL LOW (ref 12–49)
MCH: 28.3 PG (ref 26.0–34.0)
MCHC: 33.6 g/dL (ref 30.0–36.5)
MCV: 84.1 FL (ref 80.0–99.0)
MONOCYTES: 9 % (ref 5–13)
NEUTROPHILS: 81 % — ABNORMAL HIGH (ref 32–75)
PLATELET: 148 10*3/uL — ABNORMAL LOW (ref 150–400)
RBC: 2.83 M/uL — ABNORMAL LOW (ref 4.10–5.70)
RDW: 17.6 % — ABNORMAL HIGH (ref 11.5–14.5)
WBC: 11.5 10*3/uL — ABNORMAL HIGH (ref 4.1–11.1)

## 2011-10-07 LAB — MAGNESIUM: Magnesium: 1.6 MG/DL (ref 1.6–2.4)

## 2011-10-07 MED ORDER — AMLODIPINE 5 MG TAB
5 mg | Freq: Every day | ORAL | Status: DC
Start: 2011-10-07 — End: 2011-10-09
  Administered 2011-10-07 – 2011-10-09 (×3): via ORAL

## 2011-10-07 MED ORDER — CLOPIDOGREL 75 MG TAB
75 mg | Freq: Every day | ORAL | Status: DC
Start: 2011-10-07 — End: 2011-10-09
  Administered 2011-10-08 – 2011-10-09 (×2): via ORAL

## 2011-10-07 MED FILL — RANEXA 500 MG TABLET,EXTENDED RELEASE: 500 mg | ORAL | Qty: 1

## 2011-10-07 MED FILL — METOPROLOL TARTRATE 25 MG TAB: 25 mg | ORAL | Qty: 4

## 2011-10-07 MED FILL — TERAZOSIN 5 MG CAP: 5 mg | ORAL | Qty: 2

## 2011-10-07 MED FILL — ISOSORBIDE MONONITRATE SR 30 MG 24 HR TAB: 30 mg | ORAL | Qty: 1

## 2011-10-07 MED FILL — BUMETANIDE 0.25 MG/ML IJ SOLN: 0.25 mg/mL | INTRAMUSCULAR | Qty: 4

## 2011-10-07 MED FILL — LEVOTHYROXINE 150 MCG TAB: 150 mcg | ORAL | Qty: 1

## 2011-10-07 MED FILL — INSULIN LISPRO 100 UNIT/ML INJECTION: 100 unit/mL | SUBCUTANEOUS | Qty: 1

## 2011-10-07 MED FILL — LIPITOR 20 MG TABLET: 20 mg | ORAL | Qty: 2

## 2011-10-07 MED FILL — FISH OIL 340 MG-1,000 MG CAPSULE: 340-1000 mg | ORAL | Qty: 1

## 2011-10-07 MED FILL — SODIUM CHLORIDE 0.9 % IV PIGGY BACK: INTRAVENOUS | Qty: 100

## 2011-10-07 MED FILL — MISOPROSTOL 200 MCG TAB: 200 mcg | ORAL | Qty: 1

## 2011-10-07 MED FILL — PREDNISONE 20 MG TAB: 20 mg | ORAL | Qty: 1

## 2011-10-07 MED FILL — OXYCODONE-ACETAMINOPHEN 5 MG-325 MG TAB: 5-325 mg | ORAL | Qty: 1

## 2011-10-07 MED FILL — FERROUS SULFATE 325 MG (65 MG ELEMENTAL IRON) TAB: 325 mg (65 mg iron) | ORAL | Qty: 1

## 2011-10-07 MED FILL — FAMOTIDINE 20 MG TAB: 20 mg | ORAL | Qty: 1

## 2011-10-07 MED FILL — FINASTERIDE 5 MG TAB: 5 mg | ORAL | Qty: 1

## 2011-10-07 MED FILL — INSULIN GLARGINE 100 UNIT/ML INJECTION: 100 unit/mL | SUBCUTANEOUS | Qty: 0.35

## 2011-10-07 MED FILL — ARICEPT 5 MG TABLET: 5 mg | ORAL | Qty: 1

## 2011-10-07 MED FILL — ASPIRIN 81 MG CHEWABLE TAB: 81 mg | ORAL | Qty: 1

## 2011-10-07 MED FILL — VANCOMYCIN 10 GRAM IV SOLR: 10 gram | INTRAVENOUS | Qty: 1.25

## 2011-10-07 MED FILL — AMLODIPINE 5 MG TAB: 5 mg | ORAL | Qty: 2

## 2011-10-07 NOTE — Progress Notes (Signed)
Pt seen and examined. Slowly improving.     I agree with the assessment/plan as outlined in my Physician Assistant's note Lindie Spruce).    Advair, nebs, Prednisone taper  Diuresis  CPaP at night

## 2011-10-07 NOTE — Discharge Summary (Signed)
Physician Interim Summary   Patient ID:  Travis [name]  [id number]  71 y.o.  29-Mar-1941    PCP: Sol Blazing, MD     Consults: cardiology, pulmonary/intensive care and vascular surgery    Covering dates: 10/01/2011 through 10/07/2011    Admission Diagnoses: Osteomyelitis     Hospital Course:   [name] is a 71 y.o. African American male who presented to the ED with L foot infection and x-ray findings for osteomyelitis. Hospital course as follows:     Osteomyelitis- s/p L toe amputation on 5/9. Has been on zosyn/vanc. Blood and wound cultures negative. MRSA negative. Likely can de-escalate ABx based on vascular eval today     PAD (peripheral artery disease) / PVD (peripheral vascular disease) - as above     CAD (coronary artery disease) / CHF (congestive heart failure), chronic systolic, EF 40 % - Multiple STEMIs and stents. Pt has been continued on his home regimen of ASA/plavix, beta blockade, Ranexa and bumex. Monitoring hemoglobin in the setting of post-operative "oozing"    Acute on chronic renal failure. Renal currently on board. Creatinine improved to baseline. Bumex resumed.     DM with renal complications / DM foot ulcer / Uncontrolled - due to recent steroid use for COPD exacerbation, Lantus was uptitrated. May need downtitration with steroid taper     Anemia of other chronic disease - Transfused 2 units of PRBC. EPO per renal. Also continuing PO iron. Suspect acute blood loss is from surgical site, but previously with hemoccult (+) therefore repeat stool blood and continue PPI     HTN (hypertension) - metoprolol continued. Amlodipine increased     COPD (chronic obstructive pulmonary disease) with exacerbation- Hx recurrent pneumonia, chronic bronchitis (followed by Dr. Leroy Libman) .CXR with ?patchy bibasilar PNA though pt has been on zosyn/vanco since admission; suspect this is likely also 2/2 volume overload. Continuing diuresis. Was briefly on steroids and scheduled nebs; both are now being tapered as pt  symptomatically improved and no wheezing on exam    Additional hospital course and discharge summary will be done by discharging physician.

## 2011-10-07 NOTE — Nurse Consult (Signed)
Visited with patient today.  Patient voices no questions or concerns at present.  Patient states that he is receiving Home Health services through [facility] and daughter assists with medication prep at home. Will continue to monitor.

## 2011-10-07 NOTE — Other (Signed)
DTC Progress Note    Recommendations/ Comments: Chart review for elevated BG's. Ranging from 250-450 since 10/04/2011. s/p toe amputation on 10/03/11. On steroids. Was on solu medrol. Now on descending doses of Prednisone - was 40mg ; now 20mg . He has required  31 units of correction Humalog on 5/1/0/13, 48 units on 10/06/11 and 26 units on 10/06/11.  If appropriate, please consider increase in Lantus dose. He may also benefit from Humalog AC for meals and/or change to resistant correction scale.     Chart reviewed on Rande Lawman.    Patient is a 71 y.o. male with Type 2 DM on Lantus 30 units and Humalog at breakfast and supper at home.    A1c:   Lab Results   Component Value Date/Time    Hemoglobin A1c 8.5 10/02/2011  2:24 AM       POC Glucose last 24hrs:   Lab Results   Component Value Date/Time    POC GLUCOSE 205 10/07/2011  7:43 AM    POC GLUCOSE 281 10/06/2011  8:08 PM    POC GLUCOSE 249 10/06/2011  4:54 PM    POC GLUCOSE 256 10/06/2011 12:09 PM    POC GLUCOSE 197 10/06/2011  8:01 AM    POC GLUCOSE 359 10/05/2011  8:57 PM    POC GLUCOSE 396 10/05/2011  4:31 PM    POC GLUCOSE 422 10/05/2011 12:28 PM         Lab Results   Component Value Date/Time    Creatinine 1.24 10/07/2011  4:52 AM       PO intake: Patient Vitals for the past 72 hrs:   % Diet Eaten   10/06/11 1737 100 %   10/06/11 1112 100 %   10/06/11 0829 100 %   10/05/11 1641 50 %   10/05/11 1228 75 %   10/05/11 0950 50 %         Current hospital DM medications: Lantus 35 units and Humalog normal correction scale.    Will continue to follow as needed.  Elenor Quinones RN, CDE    Thank you.

## 2011-10-07 NOTE — Progress Notes (Signed)
Bedside and Verbal shift change report given to Onnie Boer, RN (oncoming nurse) by Bernette Redbird, RN (offgoing nurse).  Report given with SBAR, Intake/Output, MAR, Accordion and Recent Results.

## 2011-10-07 NOTE — Progress Notes (Signed)
Medical Progress Note      NAME: Travis [name]   DOB:  1940-11-13  MRM:  [id number]    Date/Time: 10/07/2011        Principal Problem:   *Osteomyelitis ()  Active Problems:     CAD (coronary artery disease) ()   Cath 1999 distal disease in the apical LAD and right coronary artery with    a normal ejection fraction.   Non q MI in 7/06.cath - mod LAD abn RCA disease up to 50-60% , small    occluded LCX with normal LVEF.     (?NSTEMI) during admission with pneumonia and ARF 05/2009 - proceeded to    have a PCI   (DES) to RCA.     NSTEMI (trop ~30) on 06/21/09 but cath showed stable findings.     NSTEMI with another cath in 2/11 - the stent was patent and no new    findings.     Multiple NSTEMI's in 5/11 - proceeded on 10/16/09 to PCI of 85% ulcerated    plaque mid RCA successfully stented with 2.75 Xience.  CTO Cx successful    PTCA and DES of proximal part with 2.25 Atom.            Hyperlipidemia (10/06/2009)     HTN (hypertension) (10/13/2009)     PAD (peripheral artery disease) (05/18/2011)   PVR tracing 05/18/2011 suggest bilateral SFA occlusion and calf vessel    disease    and high grade stenosis of his proximal fem-pop   Angio March 2012 ([facility]): r sfa-ak pop with prox stenosis, 1 vs r/o; left    diffuse sfa , severe tpt disease     CKD (chronic kidney disease) stage 3, GFR 30-59 ml/min (07/18/2011)     Foot ulcer (07/18/2011)     Anemia of other chronic disease (08/21/2011)     SSS (sick sinus syndrome) (08/27/2011)   07/20/09: RV: [employer] lead, model number 2088, serial number [id number],    RA: [employer] lead, model number 1488TC, serial number [id number],      Generator by [employer], model number 5826, serial number [id number],             Dementia (08/31/2011)     Chronic pain ()   back pain     GERD (gastroesophageal reflux disease) ()     Hypothyroidism ()     Hx of thromboembolism ()   leg     Pulmonary hypertension ()   PASP 60 on echo 06/22/09     CHF (congestive heart failure) ()     COPD (chronic obstructive  pulmonary disease) ()   mild COPD, recurrent pneumonia, chronic bronchitis (followed by Dr. Leroy Libman)     Sleep apnea ()   on CPAP     PVD (peripheral vascular disease) ()     Hyponatremia (10/01/2011)     Hyperkalemia (10/01/2011)     ARF (acute renal failure) (10/01/2011)        Assessment/Plan:   1.  Osteomyelitis / Foot ulcer - s/p L toe amputation on 5/9   -optimize pain control  -continue zosyn/vanc; suspect can de-escalate this AM pending vascular eval   -blood cultures negative  -wound cultures with no definite organism   -MRSA culture negative     2.  PAD (peripheral artery disease) / PVD (peripheral vascular disease) - Severe. Vascular evaluation appreciated.     3.  CAD (coronary artery disease) / CHF (congestive heart failure), chronic systolic, EF 40 % -  Multiple STEMIs and stents.   -continue ASA/plavix   -continue Ranexa, beta blockade and imdur   -resume Bumex     4.  Acute on chronic renal failure. Renal evaluation appreciated.    -resume Bumex   -strict I's and O's     5.  DM with renal complications / DM foot ulcer / Uncontrolled   -suspect poor control 2/2 steroids; continue Lantus; increased dose; monitor closely as steroids are tapers   -ISS   -diabetic diet     6.  Anemia of other chronic disease - Transfused 2 units of PRBC. EPO per renal. Continue PO iron. Suspect 2/2 surgical site bleed   -continue ASA, hold plavix this AM   -daily CBC   -stool blood pending; continue PPI     7.  SSS (sick sinus syndrome) - Pacer placed.  Follow on tele.     8.  HTN (hypertension) - Continue metoprolol   -increase amlodipine     9.  Dementia - Stable and mild. Continue aricept.     10.  GERD (gastroesophageal reflux disease) - Continue PPI      11.  Hypothyroidism - Continue synthroid     12.  COPD (chronic obstructive pulmonary disease) with exacerbation- Hx recurrent pneumonia, chronic bronchitis (followed by Dr. Leroy Libman)  -CXR with ?patchy bibasilar PNA though pt has been on zosyn/vanco since admission; suspect this  is likely also 2/2 volume overload. Continuing diuresis  -continue duonebs; change to PRN   -downtitrate steroids   -resumed Advair   -continue home CPAP   -appreciate pulmonary's assistance     13.  Sleep apnea - on CPAP     14.  Hyperlipidemia - Continue fish oil and lipitor     15.  BPH- Continue Proscar and terazosin       Subjective:     Chief Complaint:  "Can I get up"    No complaints this AM. Pain controlled. Eager to ambulate. No dizziness/lightheadedness. SOB improved     ROS:  (bold if positive, if negative)    SOB/DOE;improved  Tolerating PT  Tolerating Diet  Foot pain           Objective:       Vitals:          Last 24hrs VS reviewed since prior progress note. Most recent are:    Visit Vitals   Item Reading   ??? BP 182/86   ??? Pulse 82   ??? Temp 98 ??F (36.7 ??C)   ??? Resp 20   ??? Ht 5\' 6"  (1.676 m)   ??? Wt 177 lb 11.1 oz   ??? BMI 28.68 kg/m2   ??? SpO2 94%     SpO2 Readings from Last 6 Encounters:   10/07/11 94%   10/07/11 94%   09/03/11 95%   08/13/11 99%   08/13/11 99%   07/24/11 98%    O2 Flow Rate (L/min): 2 l/min       Intake/Output Summary (Last 24 hours) at 10/07/11 0828  Last data filed at 10/07/11 1610   Gross per 24 hour   Intake    800 ml   Output   1775 ml   Net   -975 ml          Exam:     Physical Exam:    Gen:  Well-developed, well-nourished, in no acute distress  HEENT:  Pink conjunctivae, PERRL, hearing intact to voice, moist mucous membranes  Neck:  Supple, without masses,  thyroid non-tender  Resp:  Good air movement. No wheezing appreciated  Card:  No murmurs, normal S1, S2 without thrills, bruits or peripheral edema  Abd:  Soft, non-tender, non-distended, normoactive bowel sounds are present, no palpable organomegaly and no detectable hernias  Lymph:  No cervical or inguinal adenopathy  Musc:  No cyanosis or clubbing  Skin:  To gangrene and non-healing wound  Neuro:  Cranial nerves are grossly intact, no focal motor weakness, follows commands appropriately  Psych:  Good insight, oriented to  person, place and time, alert  L elbow skin tear (POA): superficial. Nontender. Non-erythematous.   L foot C/D/I    Medications Reviewed: (see below)    Lab Data Reviewed: (see below)    ______________________________________________________________________    Medications:     Current Facility-Administered Medications   Medication Dose Route Frequency   ??? clopidogrel (PLAVIX) tablet 75 mg  75 mg Oral DAILY   ??? amLODIPine (NORVASC) tablet 10 mg  10 mg Oral DAILY   ??? predniSONE (DELTASONE) tablet 20 mg  20 mg Oral DAILY WITH BREAKFAST   ??? albuterol/ipratropium (DUONEB) neb solution  1 Dose Nebulization Q6H PRN   ??? bumetanide (BUMEX) injection 1 mg  1 mg IntraVENous DAILY   ??? insulin glargine (LANTUS) injection 35 Units  35 Units SubCUTAneous DAILY   ??? insulin lispro (HUMALOG) injection   SubCUTAneous AC&HS   ??? pneumococcal 23-valent (PNEUMOVAX 23) injection 0.5 mL  0.5 mL IntraMUSCular PRIOR TO DISCHARGE   ??? vancomycin (VANCOCIN) 1.25 g in 0.9% sodium chloride 250 mL IVPB  1,250 mg IntraVENous Q24H   ??? fluticasone-salmeterol (ADVAIR) 263mcg-50mcg/puff  1 Puff Inhalation BID RT   ??? morphine injection 2 mg  2 mg IntraVENous Q2H PRN   ??? epoetin alfa (EPOGEN;PROCRIT) injection 10,000 Units  10,000 Units SubCUTAneous Q7D   ??? albuterol (PROVENTIL VENTOLIN) nebulizer solution 2.5 mg  2.5 mg Nebulization Q2H PRN   ??? aspirin chewable tablet 81 mg  81 mg Oral DAILY   ??? levothyroxine (SYNTHROID) tablet 150 mcg  150 mcg Oral ACB   ??? nitroglycerin (NITROSTAT) tablet 0.4 mg  0.4 mg SubLINGual Q5MIN PRN   ??? terazosin (HYTRIN) capsule 10 mg  10 mg Oral QHS   ??? misoprostol (CYTOTEC) tablet 200 mcg  200 mcg Oral BID   ??? metoprolol (LOPRESSOR) tablet 100 mg  100 mg Oral BID   ??? atorvastatin (LIPITOR) tablet 40 mg  40 mg Oral QHS   ??? isosorbide mononitrate ER (IMDUR) tablet 30 mg  30 mg Oral DAILY   ??? fish oil-omega-3 fatty acids 340-1,000 mg capsule 1 Cap  1 Cap Oral BID   ??? donepezil (ARICEPT) tablet 5 mg  5 mg Oral QHS   ??? famotidine  (PEPCID) tablet 20 mg  20 mg Oral DAILY   ??? finasteride (PROSCAR) tablet 5 mg  5 mg Oral DAILY   ??? acetaminophen (TYLENOL) tablet 650 mg  650 mg Oral Q4H PRN   ??? oxyCODONE-acetaminophen (PERCOCET) 5-325 mg per tablet 1 Tab  1 Tab Oral Q4H PRN   ??? morphine injection 1 mg  1 mg IntraVENous Q3H PRN   ??? naloxone (NARCAN) injection 0.4 mg  0.4 mg IntraVENous PRN   ??? diphenhydrAMINE (BENADRYL) injection 12.5 mg  12.5 mg IntraVENous Q4H PRN   ??? ondansetron (ZOFRAN) injection 4 mg  4 mg IntraVENous Q4H PRN   ??? prochlorperazine (COMPAZINE) injection 5 mg  5 mg IntraVENous Q8H PRN   ??? glucose chewable tablet 16 g  4  Tab Oral PRN   ??? dextrose (D50W) injection Syrg 12.5-25 g  12.5-25 g IntraVENous PRN   ??? glucagon (GLUCAGEN) injection 1 mg  1 mg IntraMUSCular PRN   ??? piperacillin-tazobactam (ZOSYN) 3.375 g in 0.9% sodium chloride (MBP/ADV) 100 mL MBP  3.375 g IntraVENous Q8H   ??? ranolazine ER (RANEXA) tablet 500 mg  500 mg Oral BID   ??? ferrous sulfate tablet 325 mg  1 Tab Oral BID WITH MEALS            Lab Review:     Recent Labs   [facility] 10/07/11 0452 10/06/11 1155 10/05/11 0403    WBC 11.5* 14.9* 10.7    HGB 8.0* 9.3* 8.4*    HCT 23.8* 27.8* 24.9*    PLT 148* 107* 142*     Recent Labs   Basename 10/07/11 0452 10/06/11 0645 10/05/11 0403    NA 139 140 135*    K 3.8 4.0 4.8    CL 104 105 102    CO2 29 29 29     GLU 181* 181* 300*    BUN 22* 29* 36*    CREA 1.24* 1.46* 1.85*    CA 7.9* 7.4* 7.4*    MG 1.6 1.6 1.7    PHOS -- -- --    ALB -- -- --    TBIL -- -- --    SGOT -- -- --    INR -- -- --     Lab Results   Component Value Date/Time    POC GLUCOSE 205 10/07/2011  7:43 AM    POC GLUCOSE 281 10/06/2011  8:08 PM    POC GLUCOSE 249 10/06/2011  4:54 PM    POC GLUCOSE 256 10/06/2011 12:09 PM    POC GLUCOSE 197 10/06/2011  8:01 AM     No results found for this basename: PH:3,PCO2:3,PO2:3,HCO3:3,FIO2:3 in the last 72 hours  No results found for this basename: INR:3 in the last 72 hours    Lab Results   Component Value Date/Time     Specimen Description: NASAL 10/01/2011 10:58 PM    Specimen Description: FOOT 10/01/2011 10:58 PM    Specimen Description: BLOOD 10/01/2011  8:15 PM    Specimen Description: BLOOD 08/23/2011 10:23 PM     Lab Results   Component Value Date/Time    Culture result:  Value: MRSA NOT PRESENT     Screening of patient nares for MRSA is for surveillance purposes and, if positive, to facilitate isolation considerations in high risk settings. It is not intended for automatic decolonization interventions per se as regimens are not sufficiently effective to warrant routine use. 10/01/2011 10:58 PM    Culture result: NO GROWTH 2 DAYS 10/01/2011 10:58 PM    Culture result: NO GROWTH 5 DAYS 10/01/2011  8:15 PM    Culture result: NO GROWTH 5 DAYS 08/23/2011 10:23 PM     Total time spent with patient: 29 minutes                   Care Plan discussed with: Patient, Nursing Staff and >50% of time spent in counseling and coordination of care    Discussed:  Care Plan    Prophylaxis:  Hep SQ    Disposition:  SNF/LTC           ___________________________________________________    Attending Physician: Gaspar Bidding, MD

## 2011-10-07 NOTE — Progress Notes (Signed)
Foot looks fine  Packing removed  Keep clean dry and covered  Follow up with Dr Marcy Panning as an outpatient.

## 2011-10-07 NOTE — Progress Notes (Signed)
[facility] Progress Note  [facility]   [address] [place], [place] [address]   [phone]      Assessment & Plan:   1. Non-oliguric AKI/CKD3: Improved.   - Cr at baseline  - Will continue to follow closely.   2. Left foot OM : bl.cultures neg, on ABx  3. PAD/PVD   4. DM   5. HTN  - Amlodipine increased  6. Volume: on Buemx  7. Anemia  - on epo  -  Iron panel ok         Subjective:   CC:f/up arf,ckd,anemia  HPI: Patient seen   Feels stronger  Anemia stable  ARF is better  ROS: no cp/sob/n/v/abd pain  Current Facility-Administered Medications   Medication Dose Route Frequency   ??? clopidogrel (PLAVIX) tablet 75 mg  75 mg Oral DAILY   ??? amLODIPine (NORVASC) tablet 10 mg  10 mg Oral DAILY   ??? predniSONE (DELTASONE) tablet 20 mg  20 mg Oral DAILY WITH BREAKFAST   ??? albuterol/ipratropium (DUONEB) neb solution  1 Dose Nebulization Q6H PRN   ??? bumetanide (BUMEX) injection 1 mg  1 mg IntraVENous DAILY   ??? insulin glargine (LANTUS) injection 35 Units  35 Units SubCUTAneous DAILY   ??? insulin lispro (HUMALOG) injection   SubCUTAneous AC&HS   ??? pneumococcal 23-valent (PNEUMOVAX 23) injection 0.5 mL  0.5 mL IntraMUSCular PRIOR TO DISCHARGE   ??? vancomycin (VANCOCIN) 1.25 g in 0.9% sodium chloride 250 mL IVPB  1,250 mg IntraVENous Q24H   ??? fluticasone-salmeterol (ADVAIR) 220mcg-50mcg/puff  1 Puff Inhalation BID RT   ??? morphine injection 2 mg  2 mg IntraVENous Q2H PRN   ??? epoetin alfa (EPOGEN;PROCRIT) injection 10,000 Units  10,000 Units SubCUTAneous Q7D   ??? albuterol (PROVENTIL VENTOLIN) nebulizer solution 2.5 mg  2.5 mg Nebulization Q2H PRN   ??? aspirin chewable tablet 81 mg  81 mg Oral DAILY   ??? levothyroxine (SYNTHROID) tablet 150 mcg  150 mcg Oral ACB   ??? nitroglycerin (NITROSTAT) tablet 0.4 mg  0.4 mg SubLINGual Q5MIN PRN   ??? terazosin (HYTRIN) capsule 10 mg  10 mg Oral QHS   ??? misoprostol (CYTOTEC) tablet 200 mcg  200 mcg Oral BID   ??? metoprolol  (LOPRESSOR) tablet 100 mg  100 mg Oral BID   ??? atorvastatin (LIPITOR) tablet 40 mg  40 mg Oral QHS   ??? isosorbide mononitrate ER (IMDUR) tablet 30 mg  30 mg Oral DAILY   ??? fish oil-omega-3 fatty acids 340-1,000 mg capsule 1 Cap  1 Cap Oral BID   ??? donepezil (ARICEPT) tablet 5 mg  5 mg Oral QHS   ??? famotidine (PEPCID) tablet 20 mg  20 mg Oral DAILY   ??? finasteride (PROSCAR) tablet 5 mg  5 mg Oral DAILY   ??? acetaminophen (TYLENOL) tablet 650 mg  650 mg Oral Q4H PRN   ??? oxyCODONE-acetaminophen (PERCOCET) 5-325 mg per tablet 1 Tab  1 Tab Oral Q4H PRN   ??? morphine injection 1 mg  1 mg IntraVENous Q3H PRN   ??? naloxone (NARCAN) injection 0.4 mg  0.4 mg IntraVENous PRN   ??? diphenhydrAMINE (BENADRYL) injection 12.5 mg  12.5 mg IntraVENous Q4H PRN   ??? ondansetron (ZOFRAN) injection 4 mg  4 mg IntraVENous Q4H PRN   ??? prochlorperazine (COMPAZINE) injection 5 mg  5 mg IntraVENous Q8H PRN   ??? glucose chewable tablet 16 g  4 Tab Oral PRN   ???  dextrose (D50W) injection Syrg 12.5-25 g  12.5-25 g IntraVENous PRN   ??? glucagon (GLUCAGEN) injection 1 mg  1 mg IntraMUSCular PRN   ??? piperacillin-tazobactam (ZOSYN) 3.375 g in 0.9% sodium chloride (MBP/ADV) 100 mL MBP  3.375 g IntraVENous Q8H   ??? ranolazine ER (RANEXA) tablet 500 mg  500 mg Oral BID   ??? ferrous sulfate tablet 325 mg  1 Tab Oral BID WITH MEALS          Objective:     Vitals:  Blood pressure 179/82, pulse 89, temperature 98.3 ??F (36.8 ??C), resp. rate 20, height 5\' 6"  (1.676 m), weight 80.6 kg (177 lb 11.1 oz), SpO2 98.00%.  Temp (24hrs), Avg:98.3 ??F (36.8 ??C), Min:98 ??F (36.7 ??C), Max:98.9 ??F (37.2 ??C)      Intake and Output:  05/13 0700 - 05/13 1859  In: 100 [P.O.:100]  Out: 200 [Urine:200]  05/11 1900 - 05/13 0659  In: 800 [P.O.:800]  Out: 2425 [Urine:2425]    Physical Exam:                Patient is intubated:  no    Physical Examination:   GENERAL ASSESSMENT: NAD  HEENT:Nontraumatic   CHEST: CTA  HEART: S1S2  ABDOMEN: Soft,NT,     EXTREMITY: EDEMA 1             ECG/rhythm:     Data Review      No results found for this basename: ITNL in the last 72 hours   No results found for this basename: CPK:3,CKMB:3,TROIQ:3, in the last 72 hours  Recent Labs   Basename 10/07/11 0452 10/06/11 1155 10/06/11 0645 10/05/11 0403    NA 139 -- 140 135*    K 3.8 -- 4.0 4.8    CL 104 -- 105 102    CO2 29 -- 29 29    BUN 22* -- 29* 36*    CREA 1.24* -- 1.46* 1.85*    GLU 181* -- 181* 300*    PHOS -- -- -- --    MG 1.6 -- 1.6 1.7    CA 7.9* -- 7.4* 7.4*    ALB -- -- -- --    WBC 11.5* 14.9* -- 10.7    HGB 8.0* 9.3* -- 8.4*    HCT 23.8* 27.8* -- 24.9*    PLT 148* 107* -- 142*      No results found for this basename: INR:3,PTP:3,APTT:3, in the last 72 hours  Needs: urine analysis, urine sodium, protein and creatinine  Lab Results   Component Value Date/Time    Sodium,urine random 29 10/02/2011  9:30 AM    Creatinine,urine random 105.27 10/02/2011  9:30 AM         Discussed with:  Colleague, Nursing, pt          [facility]  [address]  [place], [place] [address]  Phone - [phone]  Fax - [phone]

## 2011-10-07 NOTE — Progress Notes (Signed)
[facility] PROGRESS NOTE  Pulmonary, Critical Care, and Sleep Medicine    Name: Travis [name] MRN: [id number]   DOB: [birth year] Hospital: [facility]   Date: 10/07/2011  Admission Date: 10/01/2011     Chart and notes reviewed. Data reviewed. I have evaluated and examined the patient.     Overnight events reviewed:  Afebrile and hemodynamically stable  Sats 98 - 99% on 2L  S/p amputation of left great toe  I/O: - 875 ml  Slept well with CPAP    ROS: Patient is fine this morning.  States his SOB and wheezing are improved.  Denies fever or chills.  Cough is better.  Denies CP.  Pain control is adequate.     Vital Signs:  BP 179/82   Pulse 89   Temp 98.3 ??F (36.8 ??C)   Resp 20   Ht 5\' 6"  (1.676 m)   Wt 80.6 kg (177 lb 11.1 oz)   BMI 28.68 kg/m2   SpO2 98%    O2 Device: Nasal cannula   O2 Flow Rate (L/min): 2 l/min   Temp (24hrs), Avg:98.3 ??F (36.8 ??C), Min:98 ??F (36.7 ??C), Max:98.9 ??F (37.2 ??C)       Intake/Output:   Last shift:      05/13 0700 - 05/13 1859  In: 100 [P.O.:100]  Out: 200 [Urine:200]  Last 3 shifts: 05/11 1900 - 05/13 0659  In: 800 [P.O.:800]  Out: 2425 [Urine:2425]    Intake/Output Summary (Last 24 hours) at 10/07/11 1137  Last data filed at 10/07/11 0917   Gross per 24 hour   Intake    350 ml   Output   1475 ml   Net  -1125 ml          Physical Exam:   General:  Alert, cooperative, no distress, appears stated age.    Head:  Normocephalic, without obvious abnormality, atraumatic.    Eyes:  Conjunctivae/corneas clear.    Nose:  Nares normal. Septum midline. Mucosa normal.    Throat:  Lips, mucosa, and tongue normal. Class 4 airway.    Neck:  Supple, symmetrical, trachea midline.   Lungs:  Faint crackles in the bases.  No wheezes.  Air moving well.   Chest wall:  No tenderness or deformity.    Heart:  Regular rate and rhythm, S1, S2 normal, no murmur, click, rub or gallop.    Abdomen:  Soft, non-tender. Bowel sounds normal. No masses, No organomegaly.    Extremities:  LLE  with wound wrapped. Left elbow with dressing in place over abrasion s/p fall per patient.  No edema.   Pulses:  2+ and symmetric all extremities.    Skin:  Dry, especially the LEs. No obvious rash.    Lymph nodes:  No cervical or clavicular lymphadenopathy appreciated.   Neurologic:  Grossly nonfocal          DATA:  MAR reviewed and pertinent medications noted or modified as needed    Labs:  Recent Labs   [facility] 10/07/11 0452 10/06/11 1155 10/05/11 0403    WBC 11.5* 14.9* 10.7    HGB 8.0* 9.3* 8.4*    HCT 23.8* 27.8* 24.9*    PLT 148* 107* 142*     Recent Labs   Basename 10/07/11 0452 10/06/11 0645 10/05/11 0403    NA 139 140 135*    K 3.8 4.0 4.8    CL 104 105 102    CO2 29 29 29  GLU 181* 181* 300*    BUN 22* 29* 36*    CREA 1.24* 1.46* 1.85*    CA 7.9* 7.4* 7.4*    MG 1.6 1.6 1.7    PHOS -- -- --    ALB -- -- --    TBIL -- -- --    SGOT -- -- --    INR -- -- --       Imaging:  No new images this morning.      IMPRESSION:   COPD Exacerbation   Pulmonary Edema  Osteomyelitis of left great toe s/p amputation  PAD   DM, uncontrolled   Acute/Chronic Renal Failure   Anemia   CAD/CM/CHF/Hx of multiple STEMIs; Cardiac cath 06/2011 (EF 40%); S/P stent and pacemaker   OSA      PLAN:   Nebs prn and prednisone taper  Continue Advair 250/50   Antibiotics per primary team and vascular surgery   Continue Diuresis; fluid balance per Renal   Continue CPAP auto nightly; patient will resume home CPAP upon discharge  GI Prophylaxis: Pepcid   DVT Prophylaxis: subcutaneous heparin         Amedeo Kinsman, PA

## 2011-10-07 NOTE — Progress Notes (Signed)
Bedside and Verbal shift change report given to Dorie Rank, RN (oncoming nurse) by Ralene Bathe, RN (offgoing nurse).  Report given with SBAR, Kardex, Intake/Output, MAR, Accordion and Recent Results.

## 2011-10-08 LAB — GLUCOSE, POC
Glucose (POC): 197 mg/dL — ABNORMAL HIGH (ref 75–110)
Glucose (POC): 175 mg/dL — ABNORMAL HIGH (ref 75–110)
Glucose (POC): 219 mg/dL — ABNORMAL HIGH (ref 75–110)
Glucose (POC): 190 mg/dL — ABNORMAL HIGH (ref 75–110)

## 2011-10-08 MED ORDER — LEVOFLOXACIN 750 MG TAB
750 mg | ORAL | Status: DC
Start: 2011-10-08 — End: 2011-10-09
  Administered 2011-10-08 – 2011-10-09 (×2): via ORAL

## 2011-10-08 MED ORDER — BUMETANIDE 1 MG TAB
1 mg | Freq: Every day | ORAL | Status: DC
Start: 2011-10-08 — End: 2011-10-09
  Administered 2011-10-08 – 2011-10-09 (×2): via ORAL

## 2011-10-08 MED FILL — FISH OIL 340 MG-1,000 MG CAPSULE: 340-1000 mg | ORAL | Qty: 1

## 2011-10-08 MED FILL — OXYCODONE-ACETAMINOPHEN 5 MG-325 MG TAB: 5-325 mg | ORAL | Qty: 1

## 2011-10-08 MED FILL — FAMOTIDINE 20 MG TAB: 20 mg | ORAL | Qty: 1

## 2011-10-08 MED FILL — INSULIN LISPRO 100 UNIT/ML INJECTION: 100 unit/mL | SUBCUTANEOUS | Qty: 1

## 2011-10-08 MED FILL — LIPITOR 20 MG TABLET: 20 mg | ORAL | Qty: 2

## 2011-10-08 MED FILL — BUMETANIDE 1 MG TAB: 1 mg | ORAL | Qty: 1

## 2011-10-08 MED FILL — AMLODIPINE 5 MG TAB: 5 mg | ORAL | Qty: 2

## 2011-10-08 MED FILL — PLAVIX 75 MG TABLET: 75 mg | ORAL | Qty: 1

## 2011-10-08 MED FILL — MISOPROSTOL 200 MCG TAB: 200 mcg | ORAL | Qty: 1

## 2011-10-08 MED FILL — ARICEPT 5 MG TABLET: 5 mg | ORAL | Qty: 1

## 2011-10-08 MED FILL — MORPHINE 2 MG/ML INJECTION: 2 mg/mL | INTRAMUSCULAR | Qty: 1

## 2011-10-08 MED FILL — SODIUM CHLORIDE 0.9 % IV PIGGY BACK: INTRAVENOUS | Qty: 100

## 2011-10-08 MED FILL — FERROUS SULFATE 325 MG (65 MG ELEMENTAL IRON) TAB: 325 mg (65 mg iron) | ORAL | Qty: 1

## 2011-10-08 MED FILL — FINASTERIDE 5 MG TAB: 5 mg | ORAL | Qty: 1

## 2011-10-08 MED FILL — PROCRIT 10,000 UNIT/ML INJECTION SOLUTION: 10000 unit/mL | INTRAMUSCULAR | Qty: 1

## 2011-10-08 MED FILL — LEVAQUIN 750 MG TABLET: 750 mg | ORAL | Qty: 1

## 2011-10-08 MED FILL — TERAZOSIN 5 MG CAP: 5 mg | ORAL | Qty: 2

## 2011-10-08 MED FILL — PREDNISONE 20 MG TAB: 20 mg | ORAL | Qty: 1

## 2011-10-08 MED FILL — RANEXA 500 MG TABLET,EXTENDED RELEASE: 500 mg | ORAL | Qty: 1

## 2011-10-08 MED FILL — METOPROLOL TARTRATE 25 MG TAB: 25 mg | ORAL | Qty: 4

## 2011-10-08 MED FILL — LEVOTHYROXINE 150 MCG TAB: 150 mcg | ORAL | Qty: 1

## 2011-10-08 MED FILL — ASPIRIN 81 MG CHEWABLE TAB: 81 mg | ORAL | Qty: 1

## 2011-10-08 MED FILL — ISOSORBIDE MONONITRATE SR 30 MG 24 HR TAB: 30 mg | ORAL | Qty: 1

## 2011-10-08 MED FILL — INSULIN GLARGINE 100 UNIT/ML INJECTION: 100 unit/mL | SUBCUTANEOUS | Qty: 0.35

## 2011-10-08 NOTE — Progress Notes (Signed)
Wound Consult:     Patient status post a left toe removal.  Sutures present and packing already removed.  Incision 7cm in length and healing well.  Plantar surface of foot most distal is 2.2x3.2 and lower one is 2.5x7.  Unknown origin but denuded areas are very painful.  Put vaseline guaze on those so no sticking/pain.  Redressed toe incision with dry dressing and gauze.    Recommendations:  Continue wound care daily and add vaseline gauze over other areas.    Seeing PT this pm.    Morley Kos, PT, DPT, [facility]

## 2011-10-08 NOTE — Progress Notes (Signed)
[facility] [place] PROGRESS NOTE  Pulmonary, Critical Care, and Sleep Medicine    Name: Travis [name] MRN: [id number]   DOB: Sep 14, 1940 Hospital: [facility]   Date: 10/08/2011  Admission Date: 10/01/2011     Chart and notes reviewed. Data reviewed. I have evaluated and examined the patient.     Overnight events reviewed:  Afebrile and hemodynamically stable  Sats 92% on RA  I/O: - 1900 ml (- 5.9L since admission)  Creat: 1.24 (down from 1.46)    ROS: Patient is okay this morning.  Denis SOB or wheezing. Denies fever or chills.  Cough is better.  Denies CP.  Pain control is adequate.     Vital Signs:  BP 163/71   Pulse 80   Temp 97.8 ??F (36.6 ??C)   Resp 20   Ht 5\' 6"  (1.676 m)   Wt 80.6 kg (177 lb 11.1 oz)   BMI 28.68 kg/m2   SpO2 92%    O2 Device: Room air   O2 Flow Rate (L/min): 2 l/min   Temp (24hrs), Avg:98.2 ??F (36.8 ??C), Min:97.8 ??F (36.6 ??C), Max:98.8 ??F (37.1 ??C)       Intake/Output:   Last shift:         Last 3 shifts: 05/12 1900 - 05/14 0659  In: 100 [P.O.:100]  Out: 2525 [Urine:2525]    Intake/Output Summary (Last 24 hours) at 10/08/11 0954  Last data filed at 10/08/11 0345   Gross per 24 hour   Intake      0 ml   Output   1800 ml   Net  -1800 ml          Physical Exam:   General:  Alert, cooperative, no distress, appears stated age.    Head:  Normocephalic, without obvious abnormality, atraumatic.    Eyes:  Conjunctivae/corneas clear.    Nose:  Nares normal. Septum midline. Mucosa normal.    Throat:  Lips, mucosa, and tongue normal. Class 4 airway.    Neck:  Supple, symmetrical, trachea midline.   Lungs:  Clear to auscultation.  No wheezes.  Air moving well.   Chest wall:  No tenderness or deformity.    Heart:  Regular rate and rhythm, S1, S2 normal, no murmur, click, rub or gallop.    Abdomen:  Soft, non-tender. Bowel sounds normal. No masses, No organomegaly.    Extremities:  Amputation healing well. Left elbow with dressing in place over abrasion s/p fall per patient.  No  edema.   Pulses:  2+ and symmetric all extremities.    Skin:  Dry, especially the LEs. No obvious rash.    Lymph nodes:  No cervical or clavicular lymphadenopathy appreciated.   Neurologic:  Grossly nonfocal          DATA:  MAR reviewed and pertinent medications noted or modified as needed    Labs:  Recent Labs   [facility] 10/07/11 0452 10/06/11 1155    WBC 11.5* 14.9*    HGB 8.0* 9.3*    HCT 23.8* 27.8*    PLT 148* 107*     Recent Labs   Basename 10/07/11 0452 10/06/11 0645    NA 139 140    K 3.8 4.0    CL 104 105    CO2 29 29    GLU 181* 181*    BUN 22* 29*    CREA 1.24* 1.46*    CA 7.9* 7.4*    MG 1.6 1.6    PHOS -- --  ALB -- --    TBIL -- --    SGOT -- --    INR -- --       Imaging:  No new images this morning.      IMPRESSION:   COPD Exacerbation: improved   Pulmonary Edema  Osteomyelitis of left great toe s/p amputation  PAD   DM, uncontrolled   Acute/Chronic Renal Failure   Anemia   CAD/CM/CHF/Hx of multiple STEMIs; Cardiac cath 06/2011 (EF 40%); S/P stent and pacemaker   OSA      PLAN:   Nebs prn and prednisone taper  Continue Advair 250/50   Antibiotics per primary team and vascular surgery   Continue Diuresis; fluid balance per Renal   Continue CPAP auto nightly; patient will resume home CPAP upon discharge  GI Prophylaxis: Pepcid   DVT Prophylaxis: subcutaneous heparin    Patient is stable from a pulmonary standpoint.  We will sign off and arrange for outpatient follow up with Dr. Leroy Libman from our group.  Please call with questions.  Thank you.         Amedeo Kinsman, PA

## 2011-10-08 NOTE — Progress Notes (Signed)
[place] [facility]  [address] [place], [place] [address]  [phone]      NAME:  Travis [name]   DOB:  May 03, 1941  MRM:  [id number]    Date/Time: 10/08/2011  10:29 AM       Subjective:     Chief Complaint:  "foot's a little sore"    No acute events. Sleeping with CPAP. Notes good urine output yesterday. Denies dyspnea, fever, CP. Still has a lot of pain with dressing changes.       Objective:     Exam:     Last 24hrs VS reviewed since prior progress note. Most recent are:    BP 157/72   Pulse 86   Temp 97.7 ??F (36.5 ??C)   Resp 20   Ht 5\' 6"  (1.676 m)   Wt 177 lb 11.1 oz   BMI 28.68 kg/m2   SpO2 94%  Gen:  Well-developed, well-nourished, in no acute distress  HEENT:  Pink conjunctivae, PERRL, hearing intact to voice, moist mucous membranes  Resp:  No accessory muscle use, few rhonchi  Card:  No murmurs, normal S1, S2 without thrills, bruits or peripheral edema  Abd:  Soft, non-tender, non-distended, normoactive bowel sounds are present, no palpable organomegaly  Musc:  Left great toe amputation site clean with early granulation tissue, no odor.  Skin:  Harvest site from left plantar surface clean but unable to be fully visualized due to pain.  Neuro:  Cranial nerves 3-12 are grossly intact, grip strength is 5/5 bilaterally and dorsi / plantarflexion is 5/5 bilaterally, follows commands appropriately  Psych:  Good insight, oriented to person, place and time, alert    Medications: Reviewed, see below.    Labs: Labs reviewed for last 24 hours.   Wound culture - no growth final       Assessment:   Mr. Massi is a 71 y.o. African American male with a history of PAD and prior osteomyelitis who is admitted with osteomyelitis of the left great toe, s/p amputation.    Patient Active Problem List   Diagnoses Date Noted   ??? Hyponatremia 10/01/2011   ??? Hyperkalemia 10/01/2011   ??? ARF (acute renal failure) 10/01/2011   ??? Osteomyelitis    ??? Chronic pain    ??? GERD  (gastroesophageal reflux disease)    ??? Hypothyroidism    ??? Hx of thromboembolism    ??? Pulmonary hypertension    ??? CHF (congestive heart failure)    ??? COPD (chronic obstructive pulmonary disease)    ??? Sleep apnea    ??? PVD (peripheral vascular disease)    ??? Dementia 08/31/2011   ??? SSS (sick sinus syndrome) 08/27/2011   ??? Anemia of other chronic disease 08/21/2011   ??? CKD (chronic kidney disease) stage 3, GFR 30-59 ml/min 07/18/2011   ??? Foot ulcer 07/18/2011   ??? PAD (peripheral artery disease) 05/18/2011   ??? HTN (hypertension) 10/13/2009   ??? Hyperlipidemia 10/06/2009   ??? CAD (coronary artery disease)      Plan:     1. Osteomyelitis - S/p more definitive treatment of amputation. Has also received many days of IV antibiotic therapy with vanc/Zosyn.   -d/c IV antibiotics   -convert to po FQ and can discharge with this   -follow-up, wound care, ambulation per Dr. Sheliah Hatch   -will try to coordinate for discharge today    2. CHF / CAD   -ASA, Plavix   -Ranexa   -  BB, Imdur   -change Bumex to home PO dose    3. ARF - Stable.    4. DM   -higher Lantus dose    5. HTN   -meds adjusted for discharge, still a little high but only increased on 5/13    6. COPD   -duonebs, Advair   -home O2 as needed chronically    Total time spent with patient: 35 minutes                  Care Plan discussed with: Patient, Nursing Staff and Consultant/Specialist    Discussed:  Care Plan    Prophylaxis:  ambulatory    Disposition:  Home w/Family           ___________________________________________________    Attending Physician: Sonnie Alamo, MD     Current Facility-Administered Medications   Medication Dose Route Frequency   ??? bumetanide (BUMEX) tablet 1 mg  1 mg Oral DAILY   ??? levofloxacin (LEVAQUIN) tablet 750 mg  750 mg Oral Q24H   ??? clopidogrel (PLAVIX) tablet 75 mg  75 mg Oral DAILY   ??? amLODIPine (NORVASC) tablet 10 mg  10 mg Oral DAILY   ??? predniSONE (DELTASONE) tablet 20 mg  20 mg Oral DAILY WITH BREAKFAST   ??? albuterol/ipratropium  (DUONEB) neb solution  1 Dose Nebulization Q6H PRN   ??? insulin glargine (LANTUS) injection 35 Units  35 Units SubCUTAneous DAILY   ??? insulin lispro (HUMALOG) injection   SubCUTAneous AC&HS   ??? pneumococcal 23-valent (PNEUMOVAX 23) injection 0.5 mL  0.5 mL IntraMUSCular PRIOR TO DISCHARGE   ??? fluticasone-salmeterol (ADVAIR) 230mcg-50mcg/puff  1 Puff Inhalation BID RT   ??? morphine injection 2 mg  2 mg IntraVENous Q2H PRN   ??? epoetin alfa (EPOGEN;PROCRIT) injection 10,000 Units  10,000 Units SubCUTAneous Q7D   ??? albuterol (PROVENTIL VENTOLIN) nebulizer solution 2.5 mg  2.5 mg Nebulization Q2H PRN   ??? aspirin chewable tablet 81 mg  81 mg Oral DAILY   ??? levothyroxine (SYNTHROID) tablet 150 mcg  150 mcg Oral ACB   ??? nitroglycerin (NITROSTAT) tablet 0.4 mg  0.4 mg SubLINGual Q5MIN PRN   ??? terazosin (HYTRIN) capsule 10 mg  10 mg Oral QHS   ??? misoprostol (CYTOTEC) tablet 200 mcg  200 mcg Oral BID   ??? metoprolol (LOPRESSOR) tablet 100 mg  100 mg Oral BID   ??? atorvastatin (LIPITOR) tablet 40 mg  40 mg Oral QHS   ??? isosorbide mononitrate ER (IMDUR) tablet 30 mg  30 mg Oral DAILY   ??? fish oil-omega-3 fatty acids 340-1,000 mg capsule 1 Cap  1 Cap Oral BID   ??? donepezil (ARICEPT) tablet 5 mg  5 mg Oral QHS   ??? famotidine (PEPCID) tablet 20 mg  20 mg Oral DAILY   ??? finasteride (PROSCAR) tablet 5 mg  5 mg Oral DAILY   ??? acetaminophen (TYLENOL) tablet 650 mg  650 mg Oral Q4H PRN   ??? oxyCODONE-acetaminophen (PERCOCET) 5-325 mg per tablet 1 Tab  1 Tab Oral Q4H PRN   ??? morphine injection 1 mg  1 mg IntraVENous Q3H PRN   ??? naloxone (NARCAN) injection 0.4 mg  0.4 mg IntraVENous PRN   ??? diphenhydrAMINE (BENADRYL) injection 12.5 mg  12.5 mg IntraVENous Q4H PRN   ??? ondansetron (ZOFRAN) injection 4 mg  4 mg IntraVENous Q4H PRN   ??? prochlorperazine (COMPAZINE) injection 5 mg  5 mg IntraVENous Q8H PRN   ??? glucose chewable tablet 16 g  4 Tab Oral PRN   ??? dextrose (D50W) injection Syrg 12.5-25 g  12.5-25 g IntraVENous PRN   ??? glucagon (GLUCAGEN)  injection 1 mg  1 mg IntraMUSCular PRN   ??? ranolazine ER (RANEXA) tablet 500 mg  500 mg Oral BID   ??? ferrous sulfate tablet 325 mg  1 Tab Oral BID WITH MEALS

## 2011-10-08 NOTE — Progress Notes (Signed)
[facility] Progress Note  [facility]   [address] [place], [place] [address]   [phone]      Assessment & Plan:   1. Non-oliguric AKI/CKD3: Improved.   - Cr at baseline  - Will continue to follow closely out pt  2. Left foot OM : bl.cultures neg, on ABx  - may go home today with abx and wound care,f/up with wound clinic and Dr.Mukherjee  3. PAD/PVD   4. DM   5. HTN  - Amlodipine increased yesterda  6. Volume: on Buemx  7. Anemia  - on epo  -  Iron panel ok         Subjective:   CC:f/up arf,ckd,anemia  HPI: Patient seen      Anemia stable  ARF is better  CKD stable  ROS: no cp/sob/n/v/abd pain  Current Facility-Administered Medications   Medication Dose Route Frequency   ??? bumetanide (BUMEX) tablet 1 mg  1 mg Oral DAILY   ??? levofloxacin (LEVAQUIN) tablet 750 mg  750 mg Oral Q24H   ??? clopidogrel (PLAVIX) tablet 75 mg  75 mg Oral DAILY   ??? amLODIPine (NORVASC) tablet 10 mg  10 mg Oral DAILY   ??? predniSONE (DELTASONE) tablet 20 mg  20 mg Oral DAILY WITH BREAKFAST   ??? albuterol/ipratropium (DUONEB) neb solution  1 Dose Nebulization Q6H PRN   ??? insulin glargine (LANTUS) injection 35 Units  35 Units SubCUTAneous DAILY   ??? insulin lispro (HUMALOG) injection   SubCUTAneous AC&HS   ??? pneumococcal 23-valent (PNEUMOVAX 23) injection 0.5 mL  0.5 mL IntraMUSCular PRIOR TO DISCHARGE   ??? fluticasone-salmeterol (ADVAIR) 29mcg-50mcg/puff  1 Puff Inhalation BID RT   ??? morphine injection 2 mg  2 mg IntraVENous Q2H PRN   ??? epoetin alfa (EPOGEN;PROCRIT) injection 10,000 Units  10,000 Units SubCUTAneous Q7D   ??? albuterol (PROVENTIL VENTOLIN) nebulizer solution 2.5 mg  2.5 mg Nebulization Q2H PRN   ??? aspirin chewable tablet 81 mg  81 mg Oral DAILY   ??? levothyroxine (SYNTHROID) tablet 150 mcg  150 mcg Oral ACB   ??? nitroglycerin (NITROSTAT) tablet 0.4 mg  0.4 mg SubLINGual Q5MIN PRN   ??? terazosin (HYTRIN) capsule 10 mg  10 mg Oral QHS   ??? misoprostol (CYTOTEC) tablet  200 mcg  200 mcg Oral BID   ??? metoprolol (LOPRESSOR) tablet 100 mg  100 mg Oral BID   ??? atorvastatin (LIPITOR) tablet 40 mg  40 mg Oral QHS   ??? isosorbide mononitrate ER (IMDUR) tablet 30 mg  30 mg Oral DAILY   ??? fish oil-omega-3 fatty acids 340-1,000 mg capsule 1 Cap  1 Cap Oral BID   ??? donepezil (ARICEPT) tablet 5 mg  5 mg Oral QHS   ??? famotidine (PEPCID) tablet 20 mg  20 mg Oral DAILY   ??? finasteride (PROSCAR) tablet 5 mg  5 mg Oral DAILY   ??? acetaminophen (TYLENOL) tablet 650 mg  650 mg Oral Q4H PRN   ??? oxyCODONE-acetaminophen (PERCOCET) 5-325 mg per tablet 1 Tab  1 Tab Oral Q4H PRN   ??? morphine injection 1 mg  1 mg IntraVENous Q3H PRN   ??? naloxone (NARCAN) injection 0.4 mg  0.4 mg IntraVENous PRN   ??? diphenhydrAMINE (BENADRYL) injection 12.5 mg  12.5 mg IntraVENous Q4H PRN   ??? ondansetron (ZOFRAN) injection 4 mg  4 mg IntraVENous Q4H PRN   ??? prochlorperazine (COMPAZINE) injection 5 mg  5 mg IntraVENous Q8H PRN   ???  glucose chewable tablet 16 g  4 Tab Oral PRN   ??? dextrose (D50W) injection Syrg 12.5-25 g  12.5-25 g IntraVENous PRN   ??? glucagon (GLUCAGEN) injection 1 mg  1 mg IntraMUSCular PRN   ??? ranolazine ER (RANEXA) tablet 500 mg  500 mg Oral BID   ??? ferrous sulfate tablet 325 mg  1 Tab Oral BID WITH MEALS          Objective:     Vitals:  Blood pressure 163/71, pulse 80, temperature 97.8 ??F (36.6 ??C), resp. rate 20, height 5\' 6"  (1.676 m), weight 80.6 kg (177 lb 11.1 oz), SpO2 92.00%.  Temp (24hrs), Avg:98.2 ??F (36.8 ??C), Min:97.8 ??F (36.6 ??C), Max:98.8 ??F (37.1 ??C)      Intake and Output:     05/12 1900 - 05/14 0659  In: 100 [P.O.:100]  Out: 2525 [Urine:2525]    Physical Exam:                Patient is intubated:  no    Physical Examination:   GENERAL ASSESSMENT: NAD  HEENT:Nontraumatic   CHEST: CTA  HEART: S1S2  ABDOMEN: Soft,NT,     EXTREMITY: EDEMA 1             ECG/rhythm:    Data Review      No results found for this basename: ITNL in the last 72 hours   No results found for this basename:  CPK:3,CKMB:3,TROIQ:3, in the last 72 hours  Recent Labs   Basename 10/07/11 0452 10/06/11 1155 10/06/11 0645    NA 139 -- 140    K 3.8 -- 4.0    CL 104 -- 105    CO2 29 -- 29    BUN 22* -- 29*    CREA 1.24* -- 1.46*    GLU 181* -- 181*    PHOS -- -- --    MG 1.6 -- 1.6    CA 7.9* -- 7.4*    ALB -- -- --    WBC 11.5* 14.9* --    HGB 8.0* 9.3* --    HCT 23.8* 27.8* --    PLT 148* 107* --      No results found for this basename: INR:3,PTP:3,APTT:3, in the last 72 hours  Needs: urine analysis, urine sodium, protein and creatinine  Lab Results   Component Value Date/Time    Sodium,urine random 29 10/02/2011  9:30 AM    Creatinine,urine random 105.27 10/02/2011  9:30 AM         Discussed with:  Colleague, Nursing, pt          [facility]  [address]  [place], [place] [address]  Phone - [phone]  Fax - [phone]

## 2011-10-08 NOTE — Progress Notes (Signed)
Pt seen and examined. Improving.     I agree with the assessment/plan as outlined in my Physician Assistant's note Lindie Spruce).     Advair, nebs, Prednisone taper   Diuresis per Renal recs  Creatinine improving  CPaP at night  Outpatient f/u will be arranged with Dr. Leroy Libman    We will sign off at this point.

## 2011-10-08 NOTE — Progress Notes (Signed)
TRANSFER - IN REPORT:    Verbal report received from Lorna, RN(name) on HAZIEL MOLNER  being received from [facility](unit) for routine progression of care      Report consisted of patient???s Situation, Background, Assessment and   Recommendations(SBAR).     Information from the following report(s) SBAR, Kardex, Intake/Output, MAR and Recent Results was reviewed with the receiving nurse.    Opportunity for questions and clarification was provided.      Assessment completed upon patient???s arrival to unit and care assumed.

## 2011-10-08 NOTE — Progress Notes (Signed)
Problem: Mobility Impaired (Adult and Pediatric)  Goal: *Acute Goals and Plan of Care (Insert Text)  Physical Therapy Goals  Initiated 10/08/2011  1. Patient will move from supine to sit and sit to supine , scoot up and down and roll side to side in bed with independence within 7 day(s).   2. Patient will transfer from bed to chair and chair to bed with independence using the least restrictive device within 7 day(s).  3. Patient will perform sit to stand with independence within 7 day(s).  4. Patient will ambulate with modified independence for 200 feet with the least restrictive device within 7 day(s).   PHYSICAL THERAPY EVALUATION  Patient: Travis [name] (71 y.o. male)  Date: 10/08/2011  Primary Diagnosis: Osteomyelitis  UNKNOWN   Procedure(s) (LRB):  AMPUTATION TOE (Left) 5 Days Post-Op   Precautions: Heel WB          ASSESSMENT :   Based on the objective data described below, the patient presents with generalized weakness and adebility. Ambulate with rolling walker CGA no loss of balance, heel weight bearing instructed to do non weight bearing patient unable to do NWB too hard for him sugested to do partial heel WB just for balance. Patient was able to ambulate safer and less pain. OOB to chair as tolerated, educate and  Instructed patient to continue active range of motion exercise on both legs while up on chair or on bed. Notified nurse and agreed to monitor patient and assist back to bed when asked.        Patient will benefit from skilled intervention to address the above impairments.  Patient???s rehabilitation potential is considered to be Excellent  Factors which may influence rehabilitation potential include:   [X]          None noted  [ ]          Mental ability/status  [ ]          Medical condition  [ ]          Home/family situation and support systems  [ ]          Safety awareness  [ ]          Pain tolerance/management  [ ]          Other:        PLAN :   Recommendations and Planned Interventions:  [X]             Bed Mobility Training             [ ]     Neuromuscular Re-Education  [X]            Transfer Training                   [ ]     Orthotic/Prosthetic Training  [X]            Gait Training                         [ ]     Modalities  [X]            Therapeutic Exercises           [ ]     Edema Management/Control  [X]            Therapeutic Activities            [ ]     Patient and Family Training/Education  [ ]   Other (comment):    Frequency/Duration: Patient will be followed by physical therapy 6 times a week to address goals.  Discharge Recommendations: Rehab, Skilled Nursing Facility and To Be Determined  Further Equipment Recommendations for Discharge: to be determine pending progress from therapy.       SUBJECTIVE:   Patient verbalized understanding that he may need to continue to use assistive device and may need some assistance when discharge from the hospital for safety pending progress from therapy.            OBJECTIVE DATA SUMMARY:       Past Medical History   Diagnosis Date   ??? CAD (coronary artery disease)         see problem list   ??? HTN (hypertension)     ??? PVD (peripheral vascular disease)     ??? Hyperlipidemia     ??? Sleep apnea         on CPAP   ??? COPD (chronic obstructive pulmonary disease)         mild COPD, recurrent pneumonia, chronic bronchitis (followed by Dr. Leroy Libman)   ??? CKD (chronic kidney disease)         Cr 1.7 in 10/09   ??? CHF (congestive heart failure)     ??? Anemia     ??? Pulmonary hypertension         PASP 60 on echo 06/22/09   ??? Pneumonia     ??? GERD (gastroesophageal reflux disease)     ??? Chronic pain         back pain   ??? Cataract     ??? GI bleed         multiple bleeding episodes and blood transfusions.  Dr. Monika Salk has followed..  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   ??? Foot ulcer 07/18/2011   ??? Osteomyelitis     ??? Hypothyroidism     ??? Hx of thromboembolism         leg   ??? DM type 2 causing renal disease     ??? DM foot ulcer       Past Surgical History   Procedure Date   ??? Hx orthopaedic          knee replacement   ??? Pr vascular surgery procedure unlist         arterial bypass   ??? Hx pacemaker 2.24.2011       [employer] Jude lead, model number 1488TC, serial number [id number]   ??? Pr cardiac surg procedure unlist         cardiac stent   ??? Hx cataract removal         removed rt eye     Prior Level of Function/Home Situation: Independent community ambulator without assistive device.   Home Situation  Home Environment: Private residence  # Steps to Enter: 5   One/Two Story Residence: Two story  # of Interior Steps: 10   Interior Rails: Nurse, adult Available: No  Living Alone: Yes   Support Systems: Child(ren);Family member(s)  Patient Expects to be Discharged to:: Private residence  Current DME Used/Available at Home: Dan Humphreys, Actor, straight;Glucometer  Critical Behavior:  Neurologic State: Alert  Orientation Level: Oriented X4          Strength:    Strength: Within functional limits                    Tone & Sensation:   Tone: Normal  Sensation: Intact               Range Of Motion:  AROM: Within functional limits           PROM: Within functional limits           Coordination:  Coordination: Within functional limits    Functional Mobility:  Bed Mobility:  Rolling: Independent  Supine to Sit: Independent  Sit to Supine: Independent  Scooting: Independent  Transfers:  Sit to Stand: CGA  Stand to Sit: CGA  Stand Pivot Transfers: Min assist, patient performs 75-90%     Bed to Chair: CGA              Balance:   Sitting: Intact  Standing: Intact;With support  Ambulation/Gait Training:  Distance (ft): 30 Feet (ft)  Assistive Device: Walker, rolling;Gait belt  Ambulation - Level of Assistance: CGA     Gait Description (WDL): Exceptions to WDL  Gait Abnormalities: Antalgic     Left Side Weight Bearing: Heel;Partial (%)  Base of Support: Widened     Speed/Cadence: Pace decreased (<100 feet/min)                                  Therapeutic Exercises:    Instructed patient to continue active range of  motion exercise on both legs while up on chair or on bed.      Pain:  Pain Scale 1: Numeric (0 - 10)  Pain Intensity 1: 0  Pain Location 1: Foot  Pain Orientation 1: Left  Pain Description 1: Aching     Activity Tolerance:   Good.  Please refer to the flowsheet for vital signs taken during this treatment.  After treatment:   [X]          Patient left in no apparent distress sitting up in chair  [ ]          Patient left in no apparent distress in bed  [X]          Call bell left within reach  [X]          Nursing notified  [ ]          Caregiver present  [ ]          Bed alarm activated      COMMUNICATION/EDUCATION:   The patient???s plan of care was discussed with: Registered Nurse and patient.  [X]          Fall prevention education was provided and the patient/caregiver indicated understanding.  [X]          Patient/family have participated as able in goal setting and plan of care.  [X]          Patient/family agree to work toward stated goals and plan of care.  [ ]          Patient understands intent and goals of therapy, but is neutral about his/her participation.  [ ]          Patient is unable to participate in goal setting and plan of care.    Thank you for this referral.  ALBERTO E DELA CRUZ, PT,WCC.   Time Calculation: 26 mins

## 2011-10-08 NOTE — Progress Notes (Signed)
TRANSFER - OUT REPORT:    Verbal report given to Leah,RN(name) on Travis [name]  being transferred to 5th floor Room 531(unit) for routine progression of care       Report consisted of patient???s Situation, Background, Assessment and   Recommendations(SBAR).     Information from the following report(s) SBAR, Kardex, Procedure Summary, Intake/Output, MAR and Recent Results was reviewed with the receiving nurse.    Opportunity for questions and clarification was provided.

## 2011-10-08 NOTE — Progress Notes (Signed)
Follow up pastoral support with patient who notes he is trusting in God. Assurance of prayer for him, his son and daughter. Pastoral care remains available for support as needed.  Delaney Meigs, Chaplain, M.Div, [facility]  [phone]

## 2011-10-08 NOTE — Progress Notes (Signed)
Bedside and Verbal shift change report given to Lorna Howell, RN (oncoming nurse) by Tiara Hill, RN (offgoing nurse).  Report given with SBAR, Intake/Output, MAR, Accordion and Recent Results.

## 2011-10-09 LAB — CBC WITH AUTOMATED DIFF
ABS. BASOPHILS: 0 10*3/uL (ref 0.0–0.1)
ABS. EOSINOPHILS: 0.1 10*3/uL (ref 0.0–0.4)
ABS. LYMPHOCYTES: 1.1 10*3/uL (ref 0.8–3.5)
ABS. MONOCYTES: 0.8 10*3/uL (ref 0.0–1.0)
ABS. NEUTROPHILS: 5.8 10*3/uL (ref 1.8–8.0)
BASOPHILS: 0 % (ref 0–1)
EOSINOPHILS: 1 % (ref 0–7)
HCT: 27.9 % — ABNORMAL LOW (ref 36.6–50.3)
HGB: 9.2 g/dL — ABNORMAL LOW (ref 12.1–17.0)
LYMPHOCYTES: 14 % (ref 12–49)
MCH: 28.3 PG (ref 26.0–34.0)
MCHC: 33 g/dL (ref 30.0–36.5)
MCV: 85.8 FL (ref 80.0–99.0)
MONOCYTES: 11 % (ref 5–13)
NEUTROPHILS: 74 % (ref 32–75)
PLATELET: 171 10*3/uL (ref 150–400)
RBC: 3.25 M/uL — ABNORMAL LOW (ref 4.10–5.70)
RDW: 18.9 % — ABNORMAL HIGH (ref 11.5–14.5)
WBC: 7.8 10*3/uL (ref 4.1–11.1)

## 2011-10-09 LAB — METABOLIC PANEL, BASIC
Anion gap: 8 mmol/L (ref 5–15)
BUN/Creatinine ratio: 17 (ref 12–20)
BUN: 19 MG/DL (ref 6–20)
CO2: 30 MMOL/L (ref 21–32)
Calcium: 8 MG/DL — ABNORMAL LOW (ref 8.5–10.1)
Chloride: 102 MMOL/L (ref 97–108)
Creatinine: 1.11 MG/DL (ref 0.45–1.15)
GFR est AA: >60 mL/min/{1.73_m2} (ref >60)
GFR est non-AA: >60 mL/min/{1.73_m2} (ref >60)
Glucose: 162 MG/DL — ABNORMAL HIGH (ref 65–100)
Potassium: 3.8 MMOL/L (ref 3.5–5.1)
Sodium: 140 MMOL/L (ref 136–145)

## 2011-10-09 LAB — GLUCOSE, POC
Glucose (POC): 339 mg/dL — ABNORMAL HIGH (ref 75–110)
Glucose (POC): 118 mg/dL — ABNORMAL HIGH (ref 75–110)
Glucose (POC): 135 mg/dL — ABNORMAL HIGH (ref 75–110)

## 2011-10-09 MED ORDER — HYDRALAZINE 25 MG TAB
25 mg | ORAL_TABLET | Freq: Three times a day (TID) | ORAL | Status: DC
Start: 2011-10-09 — End: 2011-12-25

## 2011-10-09 MED ORDER — SACCHAROMYCES BOULARDII 250 MG CAP
250 mg | ORAL_CAPSULE | Freq: Two times a day (BID) | ORAL | Status: AC
Start: 2011-10-09 — End: 2011-10-19

## 2011-10-09 MED ORDER — OXYCODONE-ACETAMINOPHEN 5 MG-325 MG TAB
5-325 mg | ORAL_TABLET | ORAL | Status: DC | PRN
Start: 2011-10-09 — End: 2011-11-21

## 2011-10-09 MED ORDER — LEVOFLOXACIN 750 MG TAB
750 mg | ORAL_TABLET | ORAL | Status: AC
Start: 2011-10-09 — End: 2011-10-19

## 2011-10-09 MED ORDER — AMLODIPINE 10 MG TAB
10 mg | ORAL_TABLET | Freq: Every day | ORAL | Status: AC
Start: 2011-10-09 — End: ?

## 2011-10-09 MED ORDER — HYDRALAZINE 25 MG TAB
25 mg | Freq: Three times a day (TID) | ORAL | Status: DC
Start: 2011-10-09 — End: 2011-10-09

## 2011-10-09 MED ORDER — FLUTICASONE-SALMETEROL 250 MCG-50 MCG/DOSE DISK DEVICE FOR INHALATION
250-50 mcg/dose | Freq: Two times a day (BID) | RESPIRATORY_TRACT | Status: DC
Start: 2011-10-09 — End: 2011-11-21

## 2011-10-09 MED FILL — MISOPROSTOL 200 MCG TAB: 200 mcg | ORAL | Qty: 1

## 2011-10-09 MED FILL — INSULIN GLARGINE 100 UNIT/ML INJECTION: 100 unit/mL | SUBCUTANEOUS | Qty: 0.35

## 2011-10-09 MED FILL — PREDNISONE 20 MG TAB: 20 mg | ORAL | Qty: 1

## 2011-10-09 MED FILL — ASPIRIN 81 MG CHEWABLE TAB: 81 mg | ORAL | Qty: 1

## 2011-10-09 MED FILL — TERAZOSIN 5 MG CAP: 5 mg | ORAL | Qty: 2

## 2011-10-09 MED FILL — INSULIN LISPRO 100 UNIT/ML INJECTION: 100 unit/mL | SUBCUTANEOUS | Qty: 1

## 2011-10-09 MED FILL — RANEXA 500 MG TABLET,EXTENDED RELEASE: 500 mg | ORAL | Qty: 1

## 2011-10-09 MED FILL — LEVAQUIN 750 MG TABLET: 750 mg | ORAL | Qty: 1

## 2011-10-09 MED FILL — AMLODIPINE 5 MG TAB: 5 mg | ORAL | Qty: 2

## 2011-10-09 MED FILL — LIPITOR 20 MG TABLET: 20 mg | ORAL | Qty: 2

## 2011-10-09 MED FILL — FERROUS SULFATE 325 MG (65 MG ELEMENTAL IRON) TAB: 325 mg (65 mg iron) | ORAL | Qty: 1

## 2011-10-09 MED FILL — ARICEPT 5 MG TABLET: 5 mg | ORAL | Qty: 1

## 2011-10-09 MED FILL — METOPROLOL TARTRATE 50 MG TAB: 50 mg | ORAL | Qty: 2

## 2011-10-09 MED FILL — FISH OIL 340 MG-1,000 MG CAPSULE: 340-1000 mg | ORAL | Qty: 1

## 2011-10-09 MED FILL — FINASTERIDE 5 MG TAB: 5 mg | ORAL | Qty: 1

## 2011-10-09 MED FILL — LEVOTHYROXINE 150 MCG TAB: 150 mcg | ORAL | Qty: 1

## 2011-10-09 MED FILL — ISOSORBIDE MONONITRATE SR 30 MG 24 HR TAB: 30 mg | ORAL | Qty: 1

## 2011-10-09 MED FILL — PLAVIX 75 MG TABLET: 75 mg | ORAL | Qty: 1

## 2011-10-09 MED FILL — BUMETANIDE 1 MG TAB: 1 mg | ORAL | Qty: 1

## 2011-10-09 MED FILL — FAMOTIDINE 20 MG TAB: 20 mg | ORAL | Qty: 1

## 2011-10-09 NOTE — Progress Notes (Signed)
NUTRITION     BEST PRACTICE ALERT:   Braden Score: 20      Nutrition: Excellent    Pt eating 100% of meals on diabetic diet.  RD PLAN   No nutrition risk identified at this time.  Patient likely to meet nutrition needs without additional interventions.  Will see again for nutrition rescreen as indicated.      PAMELA CAMPBELL, RD

## 2011-10-09 NOTE — Progress Notes (Signed)
Discharge instructions given to patient with scripts, reviewed signs and symptoms of infection. When to follow up with md.  Dischg with daughter.

## 2011-10-09 NOTE — Discharge Summary (Signed)
Patient ID:  Travis [name]  [id number]  04/21/1941    PCP:  Phys Other, MD     Admit date: 10/01/2011  Discharge date: 10/21/11    Admission Diagnoses:   Osteomyelitis  UNKNOWN     Principal Diagnosis   Osteomyelitis                                             Other Diagnoses  Problem List as of 2011/10/21  Date Reviewed: 2011-10-21      Codes Class Noted - Resolved    Diabetes mellitus type 2 with complications 250.90  2011/10/21 - Present        OSA on CPAP 327.23  2011-10-21 - Present        Chronic pain 338.29  Unknown - Present    Overview       Signed 10/01/2011  8:00 PM by Salvadore Dom, MD     back pain          GERD (gastroesophageal reflux disease) 530.81  Unknown - Present        Hypothyroidism 244.9  Unknown - Present        Hx of thromboembolism V12.51  Unknown - Present    Overview       Signed 10/01/2011  8:00 PM by Salvadore Dom, MD     leg          Pulmonary hypertension 416.8  Unknown - Present    Overview       Signed 10/01/2011  8:00 PM by Salvadore Dom, MD     PASP 60 on echo 06/22/09          CHF (congestive heart failure) 428.0  Unknown - Present        COPD (chronic obstructive pulmonary disease) 496  Unknown - Present    Overview       Signed 10/01/2011  8:01 PM by Salvadore Dom, MD     mild COPD, recurrent pneumonia, chronic bronchitis (followed by Dr. Leroy Libman)          Sleep apnea 780.57  Unknown - Present    Overview       Signed 10/01/2011  8:01 PM by Salvadore Dom, MD     on CPAP          PVD (peripheral vascular disease) 443.9  Unknown - Present        Dementia 294.20  08/31/2011 - Present        SSS (sick sinus syndrome) 427.81  08/27/2011 - Present    Overview       Addendum 08/27/2011 12:59 PM by Sherin Quarry, ACNP     07/20/09: RV: St. Jude lead, model number 2088, serial number [id number],   RA: [employer] Jude lead, model number 1488TC, serial number [id number],     Generator by [employer], model number 5826, serial number [id number],                Anemia of other chronic disease 285.29  08/21/2011 -  Present        CKD (chronic kidney disease) stage 3, GFR 30-59 ml/min (Chronic) 585.3  07/18/2011 - Present        PAD (peripheral artery disease) 443.9  05/18/2011 - Present    Overview       Addendum 05/19/2011 10:55 AM by Davy Pique, MD  PVR tracing 05/18/2011 suggest bilateral SFA occlusion and calf vessel disease   and high grade stenosis of his proximal fem-pop  Angio March 2012 (RVC): r sfa-ak pop with prox stenosis, 1 vs r/o; left diffuse sfa , severe tpt disease          HTN (hypertension) (Chronic) 401.9  10/13/2009 - Present        Hyperlipidemia (Chronic) 272.4  10/06/2009 - Present        CAD (coronary artery disease) (Chronic) 414.00  Unknown - Present    Overview       Addendum 05/19/2011 10:48 AM by Doreene Adas, MD     Cath 1999 distal disease in the apical LAD and right coronary artery with a normal ejection fraction.  Non q MI in 7/06.cath - mod LAD abn RCA disease up to 50-60% , small occluded LCX with normal LVEF.    (?NSTEMI) during admission with pneumonia and ARF 05/2009 - proceeded to have a PCI   (DES) to RCA.    NSTEMI (trop ~30) on 06/21/09 but cath showed stable findings.    NSTEMI with another cath in 2/11 - the stent was patent and no new findings.    Multiple NSTEMI's in 5/11 - proceeded on 10/16/09 to PCI of 85% ulcerated plaque mid RCA successfully stented with 2.75 Xience.  CTO Cx successful PTCA and DES of proximal part with 2.25 Atom.               RESOLVED: COPD exacerbation 491.21  10/09/2011 - 10/09/2011        *RESOLVED: Osteomyelitis 730.20  Unknown - 10/09/2011        RESOLVED: Hyponatremia 276.1  10/01/2011 - 10/09/2011        RESOLVED: Hyperkalemia 276.7  10/01/2011 - 10/09/2011        RESOLVED: ARF (acute renal failure) 584.9  10/01/2011 - 10/09/2011        RESOLVED: Hyperkalemia 276.7  08/21/2011 - 08/22/2011        RESOLVED: Hyponatremia 276.1  08/21/2011 - 08/22/2011        RESOLVED: Foot ulcer (Chronic) 707.15  07/18/2011 - 10/09/2011        RESOLVED: Chest pain, unspecified  786.50  05/16/2011 - 05/18/2011        RESOLVED: Acute renal failure (ARF) 584.9  05/16/2011 - 07/18/2011        RESOLVED: S/P coronary artery stent placement V45.82  10/17/2009 - 05/18/2011    Overview       Signed 10/17/2009 10:42 AM by Colin Benton, NP     10/16/09 85% ulcerated plaque mid RCA successfully stented with 2.75 Xience.   CTO Cx successful PTCA and DES of proximal part with 2.25 Atom.               RESOLVED: Type II or unspecified type diabetes mellitus with renal manifestations, not stated as uncontrolled (Chronic) 250.40  10/05/2009 - 05/16/2011        RESOLVED: Acute myocardial infarction, unspecified site, initial episode of care 410.91  09/30/2009 - 10/26/2009        RESOLVED: DKA (diabetic ketoacidosis) 250.10  09/29/2009 - 10/05/2009        RESOLVED: Acute kidney failure, unspecified 584.9  09/29/2009 - 10/06/2009        RESOLVED: Hyperkalemia 276.7  09/29/2009 - 10/05/2009        RESOLVED: Hyposmolality and/or hyponatremia 276.1  09/29/2009 - 10/05/2009        RESOLVED: Edema 782.3  04/02/2009 - 04/23/2009  RESOLVED: SOB (shortness of breath) 786.05  04/02/2009 - 04/23/2009            Consults: cardiology, pulmonary/intensive care, nephrology and vascular surgery    Significant Diagnostic Studies: See Hospital Course    Hospital Course:   Osteomyelitis / PAD: Mr. Travis [name] is a 71 y.o. African American male with a history of PAD who was admitted with left great toe osteomyelitis as seen on x-rays. He was started on broad antibiotics and seen by vascular surgery. He was taken to the OR on 5/9 by Dr. Marcy Panning with amputation of the ray and first metatarsal as well as a partial skin graft taken from the plantar surface of his foot. He was continued on antibiotics, which were down-graded to po levofloxacin with probiotic for 10 days on discharge until wound healing can be re-assessed in Dr. Crosby Oyster clinic. He was discharged to SNF to assist with wound care and ambulation in a protective heel-touch boot.     COPD exacerbation: Short course of steroids and addition of Advair as below.     Acute on chronic renal disease: Creatinine 1.2 on discharge.     HTN: Addition of hydralazine below.    Diabetes: Had some hyperglycemia, which was correcting after cessation of steroids.     Discharge Exam:  See today's progress note.    Disposition: SNF    Patient Medications:  Current Discharge Medication List      START taking these medications    Details   fluticasone-salmeterol (ADVAIR) 250-50 mcg/dose diskus inhaler Take 1 Puff by inhalation two (2) times a day.  Qty: 1 Inhaler, Refills: 0      hydrALAZINE (APRESOLINE) 25 mg tablet Take 1.5 Tabs by mouth three (3) times daily.  Qty: 90 Tab, Refills: 0      levofloxacin (LEVAQUIN) 750 mg tablet Take 1 Tab by mouth every twenty-four (24) hours for 10 days.  Qty: 10 Tab, Refills: 0         CONTINUE these medications which have CHANGED    Details   amLODIPine (NORVASC) 10 mg tablet Take 1 Tab by mouth daily.  Qty: 30 Tab, Refills: 0      oxyCODONE-acetaminophen (PERCOCET) 5-325 mg per tablet Take 1 Tab by mouth every four (4) hours as needed for Pain.  Qty: 30 Tab, Refills: 0         CONTINUE these medications which have NOT CHANGED    Details   ammonium lactate (LAC-HYDRIN) 12 % lotion Please apply to affected area two times daily (L lower leg)  Qty: 1 Bottle, Refills: 0      bumetanide (BUMEX) 2 mg tablet Take 0.5 Tabs by mouth daily.  Qty: 30 Tab, Refills: 0    Associated Diagnoses: CAD (coronary artery disease); Cardiac dysrhythmia, unspecified; CHF (congestive heart failure); Hyperlipidemia; HTN (hypertension); Chronic kidney disease, stage III (moderate)      donepezil (ARICEPT) 5 mg tablet Take 1 Tab by mouth nightly.  Qty: 30 Tab, Refills: 0      famotidine (PEPCID) 20 mg tablet Take 1 Tab by mouth daily.  Qty: 30 Tab, Refills: 0      finasteride (PROSCAR) 5 mg tablet Take 1 Tab by mouth daily.  Qty: 30 Tab, Refills: 0      ranolazine ER (RANEXA) 500 mg SR tablet Take 1 Tab  by mouth two (2) times a day.  Qty: 60 Tab, Refills: 0      insulin glargine (LANTUS) 100 unit/mL injection 30 Units by SubCUTAneous  route daily.  Qty: 1 Vial, Refills: 0      fish oil-omega-3 fatty acids 340-1,000 mg capsule Take 1 Cap by mouth two (2) times a day.  Qty: 60 Cap, Refills: 0      metoprolol (LOPRESSOR) 100 mg tablet Take 1 Tab by mouth two (2) times a day.  Qty: 60 Tab, Refills: 3      atorvastatin (LIPITOR) 40 mg tablet Take 1 Tab by mouth nightly.  Qty: 30 Tab, Refills: 3      isosorbide mononitrate ER (IMDUR) 30 mg tablet Take 1 Tab by mouth daily.  Qty: 30 Tab, Refills: 3      Ferrous Sulfate (SLOW FE) 47.5 mg iron TbER tablet Take 1 Tab by mouth two (2) times a day.      clopidogrel (PLAVIX) 75 mg tablet Take 1 Tab by mouth daily.  Qty: 30 Tab, Refills: 6    Associated Diagnoses: NSTEMI (non-ST elevated myocardial infarction); CAD (coronary artery disease)      albuterol (PROVENTIL HFA, VENTOLIN HFA) 90 mcg/actuation inhaler Take 2 Puffs by inhalation every six (6) hours as needed for Wheezing.  Qty: 1 Inhaler, Refills: 1    Associated Diagnoses: CHF (congestive heart failure); CAD (coronary artery disease); Hyperlipidemia; HTN (hypertension); Chronic kidney disease, stage III (moderate); Preop cardiovascular exam      potassium chloride (K-DUR, KLOR-CON) 10 mEq tablet Take 1 Tab by mouth daily.  Qty: 30 Tab, Refills: 6    Comments: PT MAY HAVE WHATEVER FORMULATION HE NORMALLY HAS  Associated Diagnoses: CAD (coronary artery disease); CHF (congestive heart failure); HTN (hypertension); Chronic kidney disease, stage III (moderate); Lightheadedness; Weakness; Anemia, unspecified      insulin aspart (NOVOLOG) 100 unit/mL injection by SubCUTAneous route Before breakfast and dinner.      Associated Diagnoses: CHF (congestive heart failure); CAD (coronary artery disease); Hyperlipidemia; HTN (hypertension); Chronic kidney disease, stage III (moderate); PVD (peripheral vascular disease); Type II or  unspecified type diabetes mellitus with renal manifestations, not stated as uncontrolled      misoprostol (CYTOTEC) 200 mcg tablet Take 1 Tab by mouth two (2) times a day.  Qty: 60 Tab, Refills: 3    Associated Diagnoses: Peptic ulcer, unspecified site, unspecified as acute or chronic, without mention of hemorrhage, perforation, or obstruction      terazosin (HYTRIN) 5 mg capsule Take 10 mg by mouth nightly.    Associated Diagnoses: Edema      aspirin 81 mg tablet Take  by mouth daily.      levothyroxine (SYNTHROID) 150 mcg tablet Take  by mouth daily (before breakfast).      nitroglycerin (NITROSTAT) 0.4 mg SL tablet by SubLINGual route every five (5) minutes as needed.           Patient Instructions:   Activity: activity as tolerated and heel-touch weight bearing left foot with boot  Diet: Diabetic Diet  Wound Care: daily dry dressing changes and vaseline gauze to plantar surface of foot  Follow-up: vascular surgery in 10 days, PCP in 2 weeks  Follow-up tests/labs: none    Signed:  Sonnie Alamo, MD  10/09/2011  12:15 PM

## 2011-10-09 NOTE — Progress Notes (Signed)
[place] [facility]  [address] [place], [place] [address]  [phone]      NAME:  Travis [name]   DOB:  1941/05/14  MRM:  [id number]    Date/Time: 10/09/2011  2:03 PM       Subjective:     Chief Complaint:  "fine"    No acute events. Foot feels better but still tender when dressing is changed. No fevers. Blood sugars tend to creep up during the day while he is getting prednisone.       Objective:     Exam:     Last 24hrs VS reviewed since prior progress note. Most recent are:    BP 143/65   Pulse 92   Temp 98.5 ??F (36.9 ??C)   Resp 18   Ht 5\' 6"  (1.676 m)   Wt 177 lb 11.1 oz   BMI 28.68 kg/m2   SpO2 97%  Gen:  Well-developed, well-nourished, in no acute distress, sitting up in chair  HEENT:  Pink conjunctivae, PERRL, hearing intact to voice, moist mucous membranes  Resp:  No accessory muscle use, CTAB  Card:  No murmurs, normal S1, S2 without thrills, bruits or peripheral edema  Abd:  Soft, non-tender, non-distended, normoactive bowel sounds are present, no palpable organomegaly  Musc:  Left great toe amputation site clean with dressing in place  Neuro:  Cranial nerves 3-12 are grossly intact, grip strength is 5/5 bilaterally and dorsi / plantarflexion is 5/5 bilaterally, follows commands appropriately  Psych:  Good insight, oriented to person, place and time, alert    Medications: Reviewed, see below.    Labs: Labs reviewed for last 24 hours.   Cr 1.2       Assessment:   Travis [name] is a 71 y.o. African American male with a history of PAD and prior osteomyelitis who is admitted with osteomyelitis of the left great toe, s/p amputation.    Patient Active Problem List   Diagnoses Date Noted   ??? Diabetes mellitus type 2 with complications 10/09/2011   ??? OSA on CPAP 10/09/2011   ??? Chronic pain    ??? GERD (gastroesophageal reflux disease)    ??? Hypothyroidism    ??? Hx of thromboembolism    ??? Pulmonary hypertension    ??? CHF (congestive heart failure)    ??? COPD (chronic  obstructive pulmonary disease)    ??? Sleep apnea    ??? PVD (peripheral vascular disease)    ??? Dementia 08/31/2011   ??? SSS (sick sinus syndrome) 08/27/2011   ??? Anemia of other chronic disease 08/21/2011   ??? CKD (chronic kidney disease) stage 3, GFR 30-59 ml/min 07/18/2011   ??? PAD (peripheral artery disease) 05/18/2011   ??? HTN (hypertension) 10/13/2009   ??? Hyperlipidemia 10/06/2009   ??? CAD (coronary artery disease)      Plan:     1. Osteomyelitis - S/p more definitive treatment of amputation. Has also received many days of IV antibiotic therapy with vanc/Zosyn.   -po abx x 10 more days for wound healing   -f/u Mukherjee/[facility] in 10 days   -heel-touch weight bearing with off-loading boot provided by PT   -dry dressing changes to amputation and vaseline gauze to skin harvest   -d/c to SNF today    2. CHF / CAD   -ASA, Plavix   -Ranexa   -BB, Imdur   -po Bumex    3. DM   -home meds  4. COPD   -stop prednisone    5. HTN   -meds adjusted for discharge, still a little high but only increased on 5/13   -expect some improvement with stopping prednisone    Total time spent with patient: 35 minutes                  Care Plan discussed with: Patient, Nursing Staff and Consultant/Specialist    Discussed:  Care Plan    Prophylaxis:  ambulatory    Disposition:  Home w/Family           ___________________________________________________    Attending Physician: Sonnie Alamo, MD     Current Facility-Administered Medications   Medication Dose Route Frequency   ??? hydrALAZINE (APRESOLINE) tablet 37.5 mg  37.5 mg Oral TID   ??? bumetanide (BUMEX) tablet 1 mg  1 mg Oral DAILY   ??? levofloxacin (LEVAQUIN) tablet 750 mg  750 mg Oral Q24H   ??? clopidogrel (PLAVIX) tablet 75 mg  75 mg Oral DAILY   ??? amLODIPine (NORVASC) tablet 10 mg  10 mg Oral DAILY   ??? albuterol/ipratropium (DUONEB) neb solution  1 Dose Nebulization Q6H PRN   ??? insulin glargine (LANTUS) injection 35 Units  35 Units SubCUTAneous DAILY   ??? insulin lispro (HUMALOG) injection    SubCUTAneous AC&HS   ??? pneumococcal 23-valent (PNEUMOVAX 23) injection 0.5 mL  0.5 mL IntraMUSCular PRIOR TO DISCHARGE   ??? fluticasone-salmeterol (ADVAIR) 288mcg-50mcg/puff  1 Puff Inhalation BID RT   ??? morphine injection 2 mg  2 mg IntraVENous Q2H PRN   ??? epoetin alfa (EPOGEN;PROCRIT) injection 10,000 Units  10,000 Units SubCUTAneous Q7D   ??? albuterol (PROVENTIL VENTOLIN) nebulizer solution 2.5 mg  2.5 mg Nebulization Q2H PRN   ??? aspirin chewable tablet 81 mg  81 mg Oral DAILY   ??? levothyroxine (SYNTHROID) tablet 150 mcg  150 mcg Oral ACB   ??? nitroglycerin (NITROSTAT) tablet 0.4 mg  0.4 mg SubLINGual Q5MIN PRN   ??? terazosin (HYTRIN) capsule 10 mg  10 mg Oral QHS   ??? misoprostol (CYTOTEC) tablet 200 mcg  200 mcg Oral BID   ??? metoprolol (LOPRESSOR) tablet 100 mg  100 mg Oral BID   ??? atorvastatin (LIPITOR) tablet 40 mg  40 mg Oral QHS   ??? isosorbide mononitrate ER (IMDUR) tablet 30 mg  30 mg Oral DAILY   ??? fish oil-omega-3 fatty acids 340-1,000 mg capsule 1 Cap  1 Cap Oral BID   ??? donepezil (ARICEPT) tablet 5 mg  5 mg Oral QHS   ??? famotidine (PEPCID) tablet 20 mg  20 mg Oral DAILY   ??? finasteride (PROSCAR) tablet 5 mg  5 mg Oral DAILY   ??? acetaminophen (TYLENOL) tablet 650 mg  650 mg Oral Q4H PRN   ??? oxyCODONE-acetaminophen (PERCOCET) 5-325 mg per tablet 1 Tab  1 Tab Oral Q4H PRN   ??? morphine injection 1 mg  1 mg IntraVENous Q3H PRN   ??? naloxone (NARCAN) injection 0.4 mg  0.4 mg IntraVENous PRN   ??? diphenhydrAMINE (BENADRYL) injection 12.5 mg  12.5 mg IntraVENous Q4H PRN   ??? ondansetron (ZOFRAN) injection 4 mg  4 mg IntraVENous Q4H PRN   ??? prochlorperazine (COMPAZINE) injection 5 mg  5 mg IntraVENous Q8H PRN   ??? glucose chewable tablet 16 g  4 Tab Oral PRN   ??? dextrose (D50W) injection Syrg 12.5-25 g  12.5-25 g IntraVENous PRN   ??? glucagon (GLUCAGEN) injection 1 mg  1 mg IntraMUSCular  PRN   ??? ranolazine ER (RANEXA) tablet 500 mg  500 mg Oral BID   ??? ferrous sulfate tablet 325 mg  1 Tab Oral BID WITH MEALS

## 2011-10-09 NOTE — Progress Notes (Signed)
Answered some of the patient's questions concerning our Mass and services shown on the TV as well as other questions about the chapel. Offered empathic listening and support. Talked with patient about his family and the support available to him.    Clare Gandy, Chaplain

## 2011-10-09 NOTE — Progress Notes (Signed)
Report called to [facility] given to nurse Breck Coons in [employer].

## 2011-10-09 NOTE — Progress Notes (Signed)
[facility] Progress Note  [address] [facility]   [address] [place], [place] [address]   [phone]      Assessment & Plan:   1. Non-oliguric AKI/CKD3: Improved.   - Cr below baseline  - Will continue to follow closely out pt  2. Left foot OM : bl.cultures neg, on ABx  - may go home today with abx and wound care,f/up with wound clinic and Dr.Mukherjee  3. PAD/PVD   4. DM   5. HTN  - Amlodipine increased yesterday  - BP remains up  - would add hydralazine and see him in clinic for titration  6. Volume: on Buemx  7. Anemia  - on epo  -  Iron panel ok         Subjective:   CC:f/up arf,ckd,anemia  HPI: Patient seen      Anemia stable   HTN is worse  CKD stable  ROS: no cp/sob/n/v/abd pain  Current Facility-Administered Medications   Medication Dose Route Frequency   ??? bumetanide (BUMEX) tablet 1 mg  1 mg Oral DAILY   ??? levofloxacin (LEVAQUIN) tablet 750 mg  750 mg Oral Q24H   ??? clopidogrel (PLAVIX) tablet 75 mg  75 mg Oral DAILY   ??? amLODIPine (NORVASC) tablet 10 mg  10 mg Oral DAILY   ??? albuterol/ipratropium (DUONEB) neb solution  1 Dose Nebulization Q6H PRN   ??? insulin glargine (LANTUS) injection 35 Units  35 Units SubCUTAneous DAILY   ??? insulin lispro (HUMALOG) injection   SubCUTAneous AC&HS   ??? pneumococcal 23-valent (PNEUMOVAX 23) injection 0.5 mL  0.5 mL IntraMUSCular PRIOR TO DISCHARGE   ??? fluticasone-salmeterol (ADVAIR) 260mcg-50mcg/puff  1 Puff Inhalation BID RT   ??? morphine injection 2 mg  2 mg IntraVENous Q2H PRN   ??? epoetin alfa (EPOGEN;PROCRIT) injection 10,000 Units  10,000 Units SubCUTAneous Q7D   ??? albuterol (PROVENTIL VENTOLIN) nebulizer solution 2.5 mg  2.5 mg Nebulization Q2H PRN   ??? aspirin chewable tablet 81 mg  81 mg Oral DAILY   ??? levothyroxine (SYNTHROID) tablet 150 mcg  150 mcg Oral ACB   ??? nitroglycerin (NITROSTAT) tablet 0.4 mg  0.4 mg SubLINGual Q5MIN PRN   ??? terazosin (HYTRIN) capsule 10 mg  10 mg Oral QHS   ??? misoprostol  (CYTOTEC) tablet 200 mcg  200 mcg Oral BID   ??? metoprolol (LOPRESSOR) tablet 100 mg  100 mg Oral BID   ??? atorvastatin (LIPITOR) tablet 40 mg  40 mg Oral QHS   ??? isosorbide mononitrate ER (IMDUR) tablet 30 mg  30 mg Oral DAILY   ??? fish oil-omega-3 fatty acids 340-1,000 mg capsule 1 Cap  1 Cap Oral BID   ??? donepezil (ARICEPT) tablet 5 mg  5 mg Oral QHS   ??? famotidine (PEPCID) tablet 20 mg  20 mg Oral DAILY   ??? finasteride (PROSCAR) tablet 5 mg  5 mg Oral DAILY   ??? acetaminophen (TYLENOL) tablet 650 mg  650 mg Oral Q4H PRN   ??? oxyCODONE-acetaminophen (PERCOCET) 5-325 mg per tablet 1 Tab  1 Tab Oral Q4H PRN   ??? morphine injection 1 mg  1 mg IntraVENous Q3H PRN   ??? naloxone (NARCAN) injection 0.4 mg  0.4 mg IntraVENous PRN   ??? diphenhydrAMINE (BENADRYL) injection 12.5 mg  12.5 mg IntraVENous Q4H PRN   ??? ondansetron (ZOFRAN) injection 4 mg  4 mg IntraVENous Q4H PRN   ??? prochlorperazine (COMPAZINE) injection 5 mg  5 mg IntraVENous  Q8H PRN   ??? glucose chewable tablet 16 g  4 Tab Oral PRN   ??? dextrose (D50W) injection Syrg 12.5-25 g  12.5-25 g IntraVENous PRN   ??? glucagon (GLUCAGEN) injection 1 mg  1 mg IntraMUSCular PRN   ??? ranolazine ER (RANEXA) tablet 500 mg  500 mg Oral BID   ??? ferrous sulfate tablet 325 mg  1 Tab Oral BID WITH MEALS          Objective:     Vitals:  Blood pressure 162/72, pulse 78, temperature 98.2 ??F (36.8 ??C), resp. rate 18, height 5\' 6"  (1.676 m), weight 80.6 kg (177 lb 11.1 oz), SpO2 96.00%.  Temp (24hrs), Avg:98.1 ??F (36.7 ??C), Min:97.6 ??F (36.4 ??C), Max:98.6 ??F (37 ??C)      Intake and Output:  05/15 0700 - 05/15 1859  In: 120 [P.O.:120]  Out: 100 [Urine:100]  05/13 1900 - 05/15 0659  In: -   Out: 2230 [Urine:2230]    Physical Exam:                Patient is intubated:  no    Physical Examination:   GENERAL ASSESSMENT: NAD  HEENT:Nontraumatic   CHEST: CTA  HEART: S1S2  ABDOMEN: Soft,NT,     EXTREMITY: EDEMA 1             ECG/rhythm:    Data Review      No results found for this basename: ITNL in the  last 72 hours   No results found for this basename: CPK:3,CKMB:3,TROIQ:3, in the last 72 hours  Recent Labs   Basename 10/09/11 0640 10/09/11 0639 10/07/11 0452 10/06/11 1155    NA -- 140 139 --    K -- 3.8 3.8 --    CL -- 102 104 --    CO2 -- 30 29 --    BUN -- 19 22* --    CREA -- 1.11 1.24* --    GLU -- 162* 181* --    PHOS -- -- -- --    MG -- -- 1.6 --    CA -- 8.0* 7.9* --    ALB -- -- -- --    WBC 7.8 -- 11.5* 14.9*    HGB 9.2* -- 8.0* 9.3*    HCT 27.9* -- 23.8* 27.8*    PLT 171 -- 148* 107*      No results found for this basename: INR:3,PTP:3,APTT:3, in the last 72 hours  Needs: urine analysis, urine sodium, protein and creatinine  Lab Results   Component Value Date/Time    Sodium,urine random 29 10/02/2011  9:30 AM    Creatinine,urine random 105.27 10/02/2011  9:30 AM         Discussed with:  Colleague, Nursing, pt          [facility]  [address]  [place], [place] [address]  Phone - [phone]  Fax - [phone]

## 2011-10-09 NOTE — Progress Notes (Signed)
I have reviewed discharge instructions/summary with patient. Medication side effects were reviewed. Opportunity for questions and clarification given. Patient verbalized understanding.

## 2011-10-09 NOTE — Progress Notes (Signed)
Bedside, Verbal and Recorded shift change report given to Brenda(oncoming nurse) by Grover Canavan  (offgoing nurse).  Report given with SBAR, OR Summary, Intake/Output, Accordion and Recent Results.

## 2011-10-09 NOTE — Progress Notes (Signed)
Problem: Mobility Impaired (Adult and Pediatric)  Goal: *Acute Goals and Plan of Care (Insert Text)  Physical Therapy Goals  Initiated 10/08/2011  1. Patient will move from supine to sit and sit to supine , scoot up and down and roll side to side in bed with independence within 7 day(s).   2. Patient will transfer from bed to chair and chair to bed with independence using the least restrictive device within 7 day(s).  3. Patient will perform sit to stand with independence within 7 day(s).  4. Patient will ambulate with modified independence for 200 feet with the least restrictive device within 7 day(s).   PHYSICAL THERAPY TREATMENT  Patient: Travis [name] (71 y.o. male)  Date: 10/09/2011  Diagnosis: Osteomyelitis  UNKNOWN  Osteomyelitis  Procedure(s) (LRB):  AMPUTATION TOE (Left) 6 Days Post-Op  Precautions:  Falls; PWB on heel       ASSESSMENT:   Mr. Effinger presents with improving activity tolerance and functional mobility. Pt supplied with post-op shoe for left foot and demonstrates appropriate PWB on heel with CGA and rolling walker. Pt reports activity tolerance limited by right lower extremity fatigue. Pt demonstrates independence with seated home exercise program and agrees to perform exercises throughout the day.  Progression toward goals:  [ ]     Improving appropriately and progressing toward goals  [X]     Improving slowly and progressing toward goals  [ ]     Not making progress toward goals and plan of care will be adjusted       PLAN:   Patient continues to benefit from skilled intervention to address the above impairments.  Continue treatment per established plan of care.  Discharge Recommendations:  24-hour assistance; pt reports intent to receive continued rehab at [facility]  Further Equipment Recommendations for Discharge:  none       SUBJECTIVE:   Patient stated ???I walked with the walker before this, but I could go pretty far.???      OBJECTIVE DATA SUMMARY:   Critical Behavior:  Neurologic  State: Alert  Orientation Level: Oriented X4        Functional Mobility Training:                      Transfers:  Sit to Stand: CGA  Stand to Sit: CGA        Bed to Chair: CGA                    Balance:  Sitting: Without support  Sitting - Static: Good (unsupported)  Sitting - Dynamic: Good (unsupported)  Standing: With support  Standing - Static: Good  Standing - Dynamic : Good  Ambulation/Gait Training:  Distance (ft): 50 Feet (ft)  Assistive Device: Walker, rolling;Gait belt;Orthotic device (post-op shoe)  Ambulation - Level of Assistance: CGA        Gait Abnormalities: Decreased step clearance     Left Side Weight Bearing: Heel;Partial (%)  Base of Support: Widened     Speed/Cadence: Pace decreased (<100 feet/min)                                                           Pain:  Pain Scale 1: Numeric (0 - 10)  Pain Intensity 1: 0  Activity Tolerance:   Fair; limited by fatigue.  Please refer to the flowsheet for vital signs taken during this treatment.  After treatment:   [X]     Patient left in no apparent distress sitting up in chair  [ ]     Patient left in no apparent distress in bed  [X]     Call bell left within reach  [X]     Nursing notified  [ ]     Caregiver present  [ ]     Bed alarm activated      COMMUNICATION/COLLABORATION:   The patient???s plan of care was discussed with: Registered Nurse    Juliann Pares, PT, DPT   Time Calculation: 12 mins

## 2011-10-09 NOTE — Progress Notes (Addendum)
Location: 5MS2 - [address]  Attn.: Fonnie Birkenhead  DOB: December 10, 1940 / Age: 71  MR#: [id number] / Admit#: [id number]  Pt. First Name: Travis  Pt. Last Name: [name] [facility]  [address] [place], [place]  [address]                        Case Management - Progress Note  Initial Open Date: 10/02/2011  Case Manager: Alroy Bailiff    Initial Open Date:  Social Worker: Sharyne Peach  Expected Date of Discharge:  Transferred From:  ECF Bed Held Until:  Bed Held By:  Power of Attorney:  POA/Guardian/Conservator Capacity:  Primary Caregiver:  Living Arrangements:  Source of Income:  Payee:  Psychosocial History:  Cultural/Religious/Language Issues:  Education Level:  ADLS/Current Living Arrangements Issues:  Past Providers:  Will patient perform self care at discharge?  Anticipated Discharge Disposition Goal:  Assessment/Plan:   10/09/2011 10:46A: Spoke with Dr. Zachery Dauer and she plans to  discharge patient after lunch.  [facility] can accept patient today.  Daughter will tranport patient to SNF and will be here around 1:00pm.  Patient is aware and is in agreement to this plan.  CM will fax discharge  instructions when available. Nursing has been instructed to call report to  [facility] [phone]. [place],RN        10/08/2011 04:20P  Spoke to pt and pt's [facility]  patient and can admit him tomorrow if he remains stable. Thanks Ames Dura, MSW        10/08/2011 03:43P  [facility] does not have any male beds today, will follow up in the am.  Spoke to [place] at [facility], DON is reviewing the information and  checking insurance. Will cont. to follow. Thanks Ames Dura, MSW          10/08/2011 12:12P  Met with pt's daughter [name], she has requested that her father be d/c to  a SNF. She wants him to have some rehab before he goes home. Have sent  referrals in [facility] to [facility] and [facility]. Will cont. to   follow. Thanks Ames Dura, MSW        10/02/2011 01:11P Pt was last discharged from [facility] on 09/03/11.At that  time,pt was discharged to [facility] SNF at [facility].      10/02/11 CARE MANAGEMENT NOTE: CM is following pt in the ER for readmission.  EMR reviewed. Per hx pt was hospitalized at [facility] from 08/21/11 to  09/03/11 with dx of chest pain. (While in the ER at that admission, pt  initially declined treatment but reconsidered). Today, pt presents to the  ER with a diabetic foot infection. He will be admitted for further medical  management. Reportedly, pt resides alone in a two story home with bedroom  on the second floor. PTA, pt has been ambulatory with a cane, and indepn  with ADLs. He relies on his dtr for transportation. Pt is retired and has  RX drug coverage - he takes medications as directed. He is currently  receiving home health svs from [facility]. DME in the home  includes a cane, rolling walker, and oxygen from [facility]. PCP  is Dr. Valentino Saxon at the [facility] of the [facility]. During this assessment  pt was somewhat resistant to answer my questions stating that he had  already  addressed these same questions. Also, he told the charge nurse that  the doctor had told him that he might die thus impacting his overall mood  and demeanor in the ER.  D.Wegner      Resources at Discharge:  Service Providers at Discharge:  Dictating Provider:  Boone Master

## 2011-10-09 NOTE — Other (Signed)
Cardiopulmonary Rehab: Past medical history of CHF noted in chart. Patient has received packets on several previous admissions. Denies having any questions regarding CHF and did not want additional packet. Will follow as indicated.

## 2011-10-14 NOTE — Progress Notes (Signed)
Travis [name] verified that patient is there, states he is doing well and may be discharged later this week.  Patient is having no cardiac issues just more issue related to toes .  Contact information given.

## 2011-11-21 MED ORDER — POTASSIUM CHLORIDE SR 10 MEQ TAB, PARTICLES/CRYSTALS
10 mEq | ORAL_TABLET | Freq: Two times a day (BID) | ORAL | Status: AC
Start: 2011-11-21 — End: ?

## 2011-11-21 MED ORDER — BUMETANIDE 2 MG TAB
2 mg | ORAL_TABLET | Freq: Two times a day (BID) | ORAL | Status: DC
Start: 2011-11-21 — End: 2011-11-21

## 2011-11-21 MED ORDER — BUMETANIDE 2 MG TAB
2 mg | ORAL_TABLET | Freq: Every day | ORAL | Status: DC
Start: 2011-11-21 — End: 2011-11-21

## 2011-11-21 MED ORDER — BUMETANIDE 2 MG TAB
2 mg | ORAL_TABLET | Freq: Two times a day (BID) | ORAL | Status: DC
Start: 2011-11-21 — End: 2011-12-25

## 2011-11-21 NOTE — Progress Notes (Signed)
Extended / Orthostatic Vitals:

## 2011-11-21 NOTE — Progress Notes (Signed)
Tharon Aquas, MD. [facility]  [address]  [place], [place] [address]  Ph [phone];   Fax [phone]      Patient: Travis [name]  DOB: 12-Dec-1940      Today's Date: 11/21/2011          History of Present Illness:  [name] is here for follow-up.  He is back home, living by himself.   He's had increased LE edema and SOB lately.  No chest pain.  Is not weighing himself.  No fevers.  + cough.  Sleeps on 2 pillows.          Past Medical History   Diagnosis Date   ??? CAD (coronary artery disease)      see problem list   ??? HTN (hypertension)    ??? PVD (peripheral vascular disease)    ??? Hyperlipidemia    ??? Sleep apnea      on CPAP   ??? COPD (chronic obstructive pulmonary disease)      mild COPD, recurrent pneumonia, chronic bronchitis (followed by Dr. Leroy Libman)   ??? CKD (chronic kidney disease)      Cr 1.7 in 10/09   ??? CHF (congestive heart failure)    ??? Anemia    ??? Pulmonary hypertension      PASP 60 on echo 06/22/09   ??? Pneumonia    ??? GERD (gastroesophageal reflux disease)    ??? Chronic pain      back pain   ??? Cataract    ??? GI bleed      multiple bleeding episodes and blood transfusions.  Dr. Monika Salk has followed..  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   ??? Foot ulcer 07/18/2011   ??? Osteomyelitis    ??? Hypothyroidism    ??? Hx of thromboembolism      leg   ??? DM type 2 causing renal disease    ??? DM foot ulcer          Past Surgical History   Procedure Date   ??? Hx orthopaedic      knee replacement   ??? Pr vascular surgery procedure unlist      arterial bypass   ??? Hx pacemaker 2.24.2011     St. Jude lead, model number 1488TC, serial number [id number]   ??? Pr cardiac surg procedure unlist      cardiac stent   ??? Hx cataract removal      removed rt eye   ??? Hx orthopaedic 07/2011     partial amputation L foot   ??? Hx orthopaedic 08/2011      partial amputation L foot     Cardiac History Includes:   1) Cath 1999 distal disease in the apical LAD and right coronary artery with a normal ejection fraction.  2) Non q MI in 7/06.cath  - mod LAD abn RCA disease up to 50-60% , small occluded LCX with normal LVEF.    (?NSTEMI) during admission with pneumonia and ARF 05/2009 - proceeded to have a PCI   (DES) to RCA.    3) NSTEMI (trop ~30) on 06/21/09 but cath showed stable findings.    4) NSTEMI with another cath in 2/11 - the stent was patent and no new findings.    5) Multiple NSTEMI's in 5/11 - proceeded on 10/16/09 to PCI of 85% ulcerated plaque mid RCA successfully stented with 2.75 Xience.  CTO Cx successful PTCA and DES of proximal part with 2.25 Atom.   CATH: 10/16/09 - 85%  ulcerated plaque mid RCA successfully stented with 2.75 Xience. CTO Cx successful PTCA and DES of proximal part with 2.25 Atom.   6) CATH: 05/20/11: d LM: 30 %. pLAD 30%. dLAD 80%. mLCX 100% prior to stent. OM1:80%. mRCA:100 % at the site of a prior stent.- Med tx  7) CATH: 2/25/2013Obstructive 3VD: LM d30. LAD m30, apical 70. LCx m100 (chronic); OM1 ost80. RCA p80 (diffuse), m100 (chronic). EF 40% with basal-mid inferior AK. No AVG, MR 1+      Cardiac Evaluation Includes:   .CATH: 2003 and 2006 - chronic LCX occlusion  STRESS: 02/09/07. The type of test was a dobutamine stress perfusion imaging. Cardiac stress testing was negative for chest pain and myocardial ischemia. Myocardial perfusion imaging showed reversible defect(s) involving the inferior wall. Gated EF (%): 55.   ECHO: 01/19/07. Echocardiogram demonstrated normal LV wall motion and ejection fraction, LVH and left atrial enlargement.   CAROTID: 2009 bilateral 50-79% stenosis.   CAROTIDS: 07/08/08 - 50-79% stenosis bilat  ECHO: 04/03/09 - TDS, moderate LVH, LVEF 65%; moderate LAE, RAE, mild AS (mean gradient 10), mild MR  ECHO: 1/11 - mild LVH, LAE, EF 55%, mild Pulm HTN, mild MR  CATH:06/05/09- DES to RCA  CATH: 06/22/09 - LAD 50%, LCX 100%, mid RCA patent stent, distal RCA 40%  ECHO: 06/22/09 - mild LVH, EF 55-60%, mild LAE, mild MR/TR, PASP 60  CATH: 07/18/09 - patent stent and stable findings; EF 60%  PACER 07/20/09: St  Jude dual chamber  CATH: 10/16/09 - 85% ulcerated plaque mid RCA successfully stented with 2.75 Xience. CTO Cx successful PTCA and DES of proximal part with 2.25 Atom.   CATH: 05/20/11: d LM: 30 %. pLAD 30%. dLAD 80%. mLCX 100% prior to stent. OM1:80%. mRCA:100 % at the site of a prior stent.- Med tx  ECHO: 07/17/11: EF: 45%, mod LVH, moderate HK basal-mid inferoseptal and  basal-mid inferior wall(s). Grade 2 DD, LAE.   CATH: 2/25/2013Obstructive 3VD: LM d30. LAD m30, apical 70. LCx m100 (chronic); OM1 ost80. RCA p80 (diffuse), m100 (chronic). EF 40% with basal-mid inferior AK.  No AVG, MR 1+        Current Outpatient Prescriptions   Medication Sig Dispense Refill   ??? bumetanide (BUMEX) 2 mg tablet Take 1 Tab by mouth daily.  30 Tab  0   ??? amLODIPine (NORVASC) 10 mg tablet Take 1 Tab by mouth daily.  30 Tab  0   ??? hydrALAZINE (APRESOLINE) 25 mg tablet Take 1.5 Tabs by mouth three (3) times daily.  90 Tab  0   ??? ammonium lactate (LAC-HYDRIN) 12 % lotion Please apply to affected area two times daily (L lower leg)  1 Bottle  0   ??? donepezil (ARICEPT) 5 mg tablet Take 1 Tab by mouth nightly.  30 Tab  0   ??? famotidine (PEPCID) 20 mg tablet Take 1 Tab by mouth daily.  30 Tab  0   ??? finasteride (PROSCAR) 5 mg tablet Take 1 Tab by mouth daily.  30 Tab  0   ??? ranolazine ER (RANEXA) 500 mg SR tablet Take 1 Tab by mouth two (2) times a day.  60 Tab  0   ??? insulin glargine (LANTUS) 100 unit/mL injection 30 Units by SubCUTAneous route daily.  1 Vial  0   ??? fish oil-omega-3 fatty acids 340-1,000 mg capsule Take 1 Cap by mouth two (2) times a day.  60 Cap  0   ??? metoprolol (LOPRESSOR) 100 mg tablet  Take 1 Tab by mouth two (2) times a day.  60 Tab  3   ??? atorvastatin (LIPITOR) 40 mg tablet Take 1 Tab by mouth nightly.  30 Tab  3   ??? isosorbide mononitrate ER (IMDUR) 30 mg tablet Take 1 Tab by mouth daily.  30 Tab  3   ??? Ferrous Sulfate (SLOW FE) 47.5 mg iron TbER tablet Take 1 Tab by mouth two (2) times a day.       ??? clopidogrel  (PLAVIX) 75 mg tablet Take 1 Tab by mouth daily.  30 Tab  6   ??? albuterol (PROVENTIL HFA, VENTOLIN HFA) 90 mcg/actuation inhaler Take 2 Puffs by inhalation every six (6) hours as needed for Wheezing.  1 Inhaler  1   ??? potassium chloride (K-DUR, KLOR-CON) 10 mEq tablet Take 1 Tab by mouth daily.  30 Tab  6   ??? insulin aspart (NOVOLOG) 100 unit/mL injection by SubCUTAneous route Before breakfast and dinner.         ??? misoprostol (CYTOTEC) 200 mcg tablet Take 1 Tab by mouth two (2) times a day.  60 Tab  3   ??? terazosin (HYTRIN) 5 mg capsule Take 10 mg by mouth nightly.       ??? aspirin 81 mg tablet Take  by mouth daily.       ??? levothyroxine (SYNTHROID) 150 mcg tablet Take  by mouth daily (before breakfast).       ??? nitroglycerin (NITROSTAT) 0.4 mg SL tablet by SubLINGual route every five (5) minutes as needed.             No Known Allergies      History   Substance Use Topics   ??? Smoking status: Former Smoker -- 20 years     Types: Cigarettes     Quit date: 11/09/1978   ??? Smokeless tobacco: Never Used   ??? Alcohol Use: No         Lab Results   Component Value Date/Time    Sodium 140 10/09/2011  6:39 AM    Potassium 3.8 10/09/2011  6:39 AM    Chloride 102 10/09/2011  6:39 AM    CO2 30 10/09/2011  6:39 AM    Anion gap 8 10/09/2011  6:39 AM    Glucose 162 10/09/2011  6:39 AM    BUN 19 10/09/2011  6:39 AM    Creatinine 1.11 10/09/2011  6:39 AM    BUN/Creatinine ratio 17 10/09/2011  6:39 AM    GFR est non-AA >60 10/09/2011  6:39 AM    Calcium 8.0 10/09/2011  6:39 AM    GFR est-AA >60 10/09/2011  6:39 AM       Review of Systems:  Constitutional: Negative for fever, chills  HEENT: Negative for vision changes.   Respiratory: Negative for hemp[tysis   Cardiovascular: Negative for syncope, and PND.   Gastrointestinal: Negative for abdominal pain, diarrhea, or melena  Genitourinary: Negative for dysuria  Musculoskeletal: Negative for myalgias.   Skin: Negative for rash  Heme: No problems bleeding.  Neurological: Negative for speech change and  focal weakness.       Physical Exam:  BP 128/66   Pulse 89   Ht 5\' 6"  (1.676 m)   Wt 185 lb 9.6 oz (84.188 kg)   BMI 29.96 kg/m2  Patient in NAD  HEENT:  Normocephalic, atraumatic.   Neck Exam: Supple   Lung Exam: Clear to auscultation, even breath sounds.   Cardiac Exam: Regular rate and  rhythm with 2/6 systolic murmur  Abdomen: Soft, non-tender, non-distended.  Extremities: 2+ lower extremity edema.        Assessment and Plan:  1) Decompensated CHF (with preserved LV systolic function)  - Mr. Trawick has had more LE swelling and SOB lately  - Will increase Bumex to 2 mg BID  - I've asked Mr. Graumann to weigh himself daily.  Phone FU in a couple of days.  Recheck BMP in a few days.   - Consider short course Zaroxolyn if he fails to diurese with Bumex    2) CAD   - Mr. Yim denies any anginal complaints.   - Continue ASA and plavix, watching for signs of recurrent GI bleeding  - Continue BB, statin, Imdur, Renexa    3) CKD - get recent labs to review     4) Anemia   - On Epo for anemia of chronic disease  - Also has a history of GI bleeding - follow closely   - Will get recent labs from Dr. Sherrine Maples to review.      5) PAD - followed by Dr. Suszanne Finch    6) HTN - BP OK    7) Phone FU in a few days.  RTC in one month (FU to be arranged).         Tharon Aquas, MD, [facility]

## 2011-11-25 NOTE — Telephone Encounter (Signed)
Spoke w/[name], the dtr, pt is doing well with med changes from Dr Welton Flakes. They were just returning from getting his procrit shot and she has replaced the battery in his scales. She will have him weigh daily and call for weight gain of 3-5 lbs in a week.

## 2011-11-25 NOTE — Telephone Encounter (Signed)
Message copied by Einar Gip on Mon Nov 25, 2011 10:52 AM  ------       Message from: Laddie Aquas       Created: Sun Nov 24, 2011  9:20 PM       Regarding: phone FU          Can you please call Mr. Agcaoili and his daughter and see how he is doing -- is he diuresing?  Any problems?        Thanks,       SK

## 2011-12-04 NOTE — Progress Notes (Signed)
Patient with history of CHF, DM with foot ulcer, edema in lower extremities.  Patient verified.  Patient states he is living at his home and his daughter comes in daily.  States his daughter fills his pill box so he doesn't want to discuss his medications as she takes care of them.   Reviewed red flags for CHF (see below) and patient understands when to seek medical attention from Cardiology.  Patient???s next follow up visit is scheduled for July 31.    Red Flags:  Weight gain, shortness of breath, shortness of breath when lying flat, chest pain, swelling in ankles or feet, abdominal pain/bloating, feeling of impending doom    States swelling in legs is gone and they look like normal. His daughter helps him weigh every day and he hasn't gained any but isn't sure of what he weighs.  Is not open to talking at this time.  Patient given my contact information.

## 2011-12-25 NOTE — Progress Notes (Signed)
Tharon Aquas, MD. [facility]  [address], [address]  [place], [place] [address]  Ph [phone];   Fax [phone]      Patient: Travis [name]  DOB: 1941-01-20      Today's Date: 12/25/2011          History of Present Illness:  Travis [name] is here for follow-up.  He has been doing OK.  His edema is much better on bumex so his daughter cut back dose last week.  Weight down almost 20 lbs and breathing much better.  No CP.        Past Medical History   Diagnosis Date   ??? CAD (coronary artery disease)      see problem list   ??? HTN (hypertension)    ??? PVD (peripheral vascular disease)    ??? Hyperlipidemia    ??? Sleep apnea      on CPAP   ??? COPD (chronic obstructive pulmonary disease)      mild COPD, recurrent pneumonia, chronic bronchitis (followed by Dr. Leroy Libman)   ??? CKD (chronic kidney disease)      Cr 1.7 in 10/09   ??? CHF (congestive heart failure)    ??? Anemia    ??? Pulmonary hypertension      PASP 60 on echo 06/22/09   ??? Pneumonia    ??? GERD (gastroesophageal reflux disease)    ??? Chronic pain      back pain   ??? Cataract    ??? GI bleed      multiple bleeding episodes and blood transfusions.  Dr. Monika Salk has followed..  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   ??? Foot ulcer 07/18/2011   ??? Osteomyelitis    ??? Hypothyroidism    ??? Hx of thromboembolism      leg   ??? DM type 2 causing renal disease    ??? DM foot ulcer          Past Surgical History   Procedure Date   ??? Hx orthopaedic      knee replacement   ??? Pr vascular surgery procedure unlist      arterial bypass   ??? Hx pacemaker 2.24.2011     St. Jude lead, model number 1488TC, serial number [id number]   ??? Pr cardiac surg procedure unlist      cardiac stent   ??? Hx cataract removal      removed rt eye   ??? Hx orthopaedic 07/2011     partial amputation L foot   ??? Hx orthopaedic 08/2011      partial amputation L foot         Cardiac History Includes:   1) Cath 1999 distal disease in the apical LAD and right coronary artery with a normal ejection fraction.   2) Non q MI in 7/06.cath -  mod LAD abn RCA disease up to 50-60% , small occluded LCX with normal LVEF.   (?NSTEMI) during admission with pneumonia and ARF 05/2009 - proceeded to have a PCI (DES) to RCA.   3) NSTEMI (trop ~30) on 06/21/09 but cath showed stable findings.   4) NSTEMI with another cath in 2/11 - the stent was patent and no new findings.   5) Multiple NSTEMI's in 5/11 - proceeded on 10/16/09 to PCI of 85% ulcerated plaque mid RCA successfully stented with 2.75 Xience. CTO Cx successful PTCA and DES of proximal part with 2.25 Atom. CATH: 10/16/09 - 85% ulcerated plaque mid RCA successfully stented with 2.75 Xience.  CTO Cx successful PTCA and DES of proximal part with 2.25 Atom.   6) CATH: 05/20/11: d LM: 30 %. pLAD 30%. dLAD 80%. mLCX 100% prior to stent. OM1:80%. mRCA:100 % at the site of a prior stent.- Med tx   7) CATH: 2/25/2013Obstructive 3VD: LM d30. LAD m30, apical 70. LCx m100 (chronic); OM1 ost80. RCA p80 (diffuse), m100 (chronic). EF 40% with basal-mid inferior AK. No AVG, MR 1+       Cardiac Evaluation Includes:   .CATH: 2003 and 2006 - chronic LCX occlusion   STRESS: 02/09/07. The type of test was a dobutamine stress perfusion imaging. Cardiac stress testing was negative for chest pain and myocardial ischemia. Myocardial perfusion imaging showed reversible defect(s) involving the inferior wall. Gated EF (%): 55.   ECHO: 01/19/07. Echocardiogram demonstrated normal LV wall motion and ejection fraction, LVH and left atrial enlargement.   CAROTID: 2009 bilateral 50-79% stenosis.   CAROTIDS: 07/08/08 - 50-79% stenosis bilat   ECHO: 04/03/09 - TDS, moderate LVH, LVEF 65%; moderate LAE, RAE, mild AS (mean gradient 10), mild MR   ECHO: 1/11 - mild LVH, LAE, EF 55%, mild Pulm HTN, mild MR   CATH:06/05/09- DES to RCA   CATH: 06/22/09 - LAD 50%, LCX 100%, mid RCA patent stent, distal RCA 40%   ECHO: 06/22/09 - mild LVH, EF 55-60%, mild LAE, mild MR/TR, PASP 60   CATH: 07/18/09 - patent stent and stable findings; EF 60%   PACER 07/20/09: St  Jude dual chamber   CATH: 10/16/09 - 85% ulcerated plaque mid RCA successfully stented with 2.75 Xience. CTO Cx successful PTCA and DES of proximal part with 2.25 Atom.   CATH: 05/20/11: d LM: 30 %. pLAD 30%. dLAD 80%. mLCX 100% prior to stent. OM1:80%. mRCA:100 % at the site of a prior stent.- Med tx   ECHO: 07/17/11: EF: 45%, mod LVH, moderate HK basal-mid inferoseptal and   basal-mid inferior wall(s). Grade 2 DD, LAE.   CATH: 2/25/2013Obstructive 3VD: LM d30. LAD m30, apical 70. LCx m100 (chronic); OM1 ost80. RCA p80 (diffuse), m100 (chronic). EF 40% with basal-mid inferior AK.   No AVG, MR 1+         Current Outpatient Prescriptions   Medication Sig Dispense Refill   ??? fluticasone-salmeterol (ADVAIR DISKUS) 100-50 mcg/dose diskus inhaler Take 1 Puff by inhalation every twelve (12) hours.       ??? ferrous sulfate (IRON) 325 mg (65 mg iron) tablet Take  by mouth two (2) times a day.       ??? bumetanide (BUMEX) 2 mg tablet Take 1.5 mg by mouth daily.       ??? epoetin alfa (PROCRIT) 20,000 unit/mL injection by SubCUTAneous route every seven (7) days.       ??? potassium chloride (K-DUR, KLOR-CON) 10 mEq tablet Take 1 Tab by mouth two (2) times a day.  60 Tab  6   ??? amLODIPine (NORVASC) 10 mg tablet Take 1 Tab by mouth daily.  30 Tab  0   ??? ammonium lactate (LAC-HYDRIN) 12 % lotion Please apply to affected area two times daily (L lower leg)  1 Bottle  0   ??? donepezil (ARICEPT) 5 mg tablet Take 1 Tab by mouth nightly.  30 Tab  0   ??? famotidine (PEPCID) 20 mg tablet Take 1 Tab by mouth daily.  30 Tab  0   ??? finasteride (PROSCAR) 5 mg tablet Take 1 Tab by mouth daily.  30 Tab  0   ???  ranolazine ER (RANEXA) 500 mg SR tablet Take 1 Tab by mouth two (2) times a day.  60 Tab  0   ??? insulin glargine (LANTUS) 100 unit/mL injection 30 Units by SubCUTAneous route daily.  1 Vial  0   ??? fish oil-omega-3 fatty acids 340-1,000 mg capsule Take 1 Cap by mouth two (2) times a day.  60 Cap  0   ??? metoprolol (LOPRESSOR) 100 mg tablet Take 1 Tab  by mouth two (2) times a day.  60 Tab  3   ??? atorvastatin (LIPITOR) 40 mg tablet Take 1 Tab by mouth nightly.  30 Tab  3   ??? isosorbide mononitrate ER (IMDUR) 30 mg tablet Take 1 Tab by mouth daily.  30 Tab  3   ??? clopidogrel (PLAVIX) 75 mg tablet Take 1 Tab by mouth daily.  30 Tab  6   ??? albuterol (PROVENTIL HFA, VENTOLIN HFA) 90 mcg/actuation inhaler Take 2 Puffs by inhalation every six (6) hours as needed for Wheezing.  1 Inhaler  1   ??? insulin aspart (NOVOLOG) 100 unit/mL injection by SubCUTAneous route Before breakfast and dinner.         ??? misoprostol (CYTOTEC) 200 mcg tablet Take 1 Tab by mouth two (2) times a day.  60 Tab  3   ??? terazosin (HYTRIN) 5 mg capsule Take 10 mg by mouth nightly.       ??? aspirin 81 mg tablet Take  by mouth daily.       ??? levothyroxine (SYNTHROID) 150 mcg tablet Take  by mouth daily (before breakfast).       ??? nitroglycerin (NITROSTAT) 0.4 mg SL tablet by SubLINGual route every five (5) minutes as needed.             No Known Allergies      History   Substance Use Topics   ??? Smoking status: Former Smoker -- 20 years     Types: Cigarettes     Quit date: 11/09/1978   ??? Smokeless tobacco: Never Used   ??? Alcohol Use: No         Review of Systems:   Constitutional: Negative for fever, chills   HEENT: Negative for vision changes.   Respiratory: Negative for hemp[tysis   Cardiovascular: Negative for syncope, and PND.   Gastrointestinal: Negative for abdominal pain, diarrhea, or melena   Genitourinary: Negative for dysuria   Musculoskeletal: Negative for myalgias.   Skin: Negative for rash   Heme: No problems bleeding.   Neurological: Negative for speech change and focal weakness.           Physical Exam:   BP 110/46   Pulse 73   Wt 170 lb 9.6 oz (77.384 kg)   SpO2 98%   Patient in NAD   HEENT: Normocephalic, atraumatic.   Neck Exam: Supple   Lung Exam: Clear to auscultation, even breath sounds.   Cardiac Exam: Regular rate and rhythm with 2/6 systolic murmur   Abdomen: Soft, non-tender,  non-distended.   Extremities: mild lower extremity edema with chronic venous stasis changes           Assessment and Plan:   1) CHF (with preserved LV systolic function)   - Mr. Wegener has done well with his extra Bumex and swelling improved significantly   - Continue bumex 1 mg in AM and 0.5 in PM -- -daughter knows to watch weight closely and adjust bumex as needed      2) CAD   -  Mr. Dever denies any anginal complaints.   - Continue ASA and plavix, watching for signs of recurrent GI bleeding   - Continue BB, statin, Imdur, Renexa     3) CKD - he is followed closely by Dr. Sherrine Maples - gets labs done weekly at the infusion center - get recent labs to review -     4) Anemia   - On Epo for anemia of chronic disease   - Also has a history of GI bleeding - follow closely   - Will get recent labs from Dr. Sherrine Maples to review.     5) PAD - followed by Dr. Suszanne Finch     6) HTN - BP OK     7) RTC in 6 weeks.     Tharon Aquas, MD, [facility]

## 2012-01-13 MED ORDER — ISOSORBIDE MONONITRATE SR 30 MG 24 HR TAB
30 mg | ORAL_TABLET | Freq: Every day | ORAL | Status: AC
Start: 2012-01-13 — End: ?

## 2012-01-13 MED ORDER — ATORVASTATIN 40 MG TAB
40 mg | ORAL_TABLET | Freq: Every evening | ORAL | Status: AC
Start: 2012-01-13 — End: ?

## 2012-01-13 MED ORDER — METOPROLOL TARTRATE 100 MG TAB
100 mg | ORAL_TABLET | Freq: Two times a day (BID) | ORAL | Status: AC
Start: 2012-01-13 — End: ?

## 2012-01-13 NOTE — Telephone Encounter (Signed)
Verbal order for refill per Dr. Khan

## 2012-01-26 DEATH — deceased

## 2014-07-06 NOTE — Progress Notes (Signed)
Rev while inpt, dw pt. See Progress note for detail.

## 2015-06-28 HISTORY — PX: LUMBAR LAMINECTOMY: SHX95

## 2016-03-04 DIAGNOSIS — N320 Bladder-neck obstruction: Secondary | ICD-10-CM | POA: Insufficient documentation

## 2016-05-27 HISTORY — PX: COLONOSCOPY: SHX174

## 2016-05-27 HISTORY — PX: POLYPECTOMY: SHX149

## 2017-03-24 DIAGNOSIS — J301 Allergic rhinitis due to pollen: Secondary | ICD-10-CM | POA: Insufficient documentation

## 2017-03-24 DIAGNOSIS — K219 Gastro-esophageal reflux disease without esophagitis: Secondary | ICD-10-CM | POA: Insufficient documentation

## 2017-12-25 HISTORY — PX: TOTAL KNEE REVISION: SHX996

## 2019-01-19 DIAGNOSIS — I700 Atherosclerosis of aorta: Secondary | ICD-10-CM | POA: Insufficient documentation

## 2019-05-13 DIAGNOSIS — D692 Other nonthrombocytopenic purpura: Secondary | ICD-10-CM | POA: Insufficient documentation

## 2019-07-10 ENCOUNTER — Other Ambulatory Visit: Payer: Self-pay

## 2019-07-10 ENCOUNTER — Encounter (HOSPITAL_COMMUNITY): Payer: Self-pay

## 2019-07-10 ENCOUNTER — Emergency Department (HOSPITAL_COMMUNITY)
Admission: EM | Admit: 2019-07-10 | Discharge: 2019-07-10 | Disposition: A | Discharge status: Home / Self Care | Payer: Medicare Other | Attending: Emergency Medicine | Admitting: Emergency Medicine

## 2019-07-10 DIAGNOSIS — I10 Essential (primary) hypertension: Secondary | ICD-10-CM | POA: Insufficient documentation

## 2019-07-10 DIAGNOSIS — T839XXA Unspecified complication of genitourinary prosthetic device, implant and graft, initial encounter: Secondary | ICD-10-CM

## 2019-07-10 DIAGNOSIS — T83091A Other mechanical complication of indwelling urethral catheter, initial encounter: Secondary | ICD-10-CM | POA: Diagnosis not present

## 2019-07-10 DIAGNOSIS — R339 Retention of urine, unspecified: Secondary | ICD-10-CM | POA: Diagnosis present

## 2019-07-10 DIAGNOSIS — Z8546 Personal history of malignant neoplasm of prostate: Secondary | ICD-10-CM | POA: Diagnosis not present

## 2019-07-10 NOTE — Discharge Instructions (Signed)
Follow up with your urologist as planned.  Return as needed for worsening symptoms.

## 2019-07-10 NOTE — ED Triage Notes (Signed)
He tells me he "got a COVID shot" the day before yesterday; and that yesterday it was necessary for his urologist to insert a (24 french) foley catheter d/t urinary retention. He theorizes that "the shot may've had something to do with it". He is in no distress.

## 2019-07-10 NOTE — ED Notes (Signed)
Bladder Scan: 0ML ?

## 2019-07-10 NOTE — ED Provider Notes (Signed)
[facility] DEPT Provider Note   CSN: [id number] Arrival date & time: 07/10/19  1152     History Chief Complaint  Patient presents with  . Urinary Retention    Kenneth Montoya is a 79 y.o. male.  HPI   Patient presents to the ED for evaluation of a Foley catheter issue.  Patient states he started developing urinary retention couple days ago.  He was seen at his urologist office yesterday and and they inserted a Foley catheter because of urinary retention.  Patient did had some blood in his urine at the time.  He had been taking some NSAIDs recently because he had his Covid shot.  Patient states when he woke up this morning he noted that there was not a lot of urine in the Foley bag.  Patient was concerned so he called his urologist and they instructed him to come to the ED to be evaluated.  Patient denies having any significant pain.  He is not having any fevers or chills.  He has not noticed any abdominal distention.  Past medical history: Essential hypertension, gastroesophageal reflux disease, history of prostate cancer   Surgical history: Transurethral resection of prostate     History reviewed. No pertinent family history.  Social History   Tobacco Use  . Smoking status: Never Smoker  . Smokeless tobacco: Never Used  Substance Use Topics  . Alcohol use: Not on file  . Drug use: Not on file    Home Medications Prior to Admission medications   Not on File    Allergies    Patient has no known allergies.  Review of Systems   Review of Systems  Constitutional: Negative for fever.  Respiratory: Negative for shortness of breath.   Gastrointestinal: Negative for abdominal pain.  All other systems reviewed and are negative.   Physical Exam Updated Vital Signs BP (!) 154/82 (BP Location: Left Arm)   Pulse 79   Temp 98 F (36.7 C) (Oral)   Resp 18   SpO2 93%   Physical Exam Vitals and nursing note reviewed.  Constitutional:        General: He is not in acute distress.    Appearance: He is well-developed.  HENT:     Head: Normocephalic and atraumatic.     Right Ear: External ear normal.     Left Ear: External ear normal.  Eyes:     General: No scleral icterus.       Right eye: No discharge.        Left eye: No discharge.     Conjunctiva/sclera: Conjunctivae normal.  Neck:     Trachea: No tracheal deviation.  Cardiovascular:     Rate and Rhythm: Normal rate and regular rhythm.  Pulmonary:     Effort: Pulmonary effort is normal. No respiratory distress.     Breath sounds: Normal breath sounds. No stridor. No wheezing or rales.  Abdominal:     General: Bowel sounds are normal. There is no distension.     Palpations: Abdomen is soft.     Tenderness: There is no abdominal tenderness. There is no guarding or rebound.  Genitourinary:    Comments: Foley catheter in place, small amount of serosanguineous urine noted in the Foley catheter bag Musculoskeletal:        General: No tenderness.     Cervical back: Neck supple.  Skin:    General: Skin is warm and dry.     Findings: No rash.  Neurological:  Mental Status: He is alert.     Cranial Nerves: No cranial nerve deficit (no facial droop, extraocular movements intact, no slurred speech).     Sensory: No sensory deficit.     Motor: No abnormal muscle tone or seizure activity.     Coordination: Coordination normal.     ED Results / Procedures / Treatments   Labs (all labs ordered are listed, but only abnormal results are displayed) Labs Reviewed - No data to display  EKG None  Radiology No results found.  Procedures Procedures (including critical care time)  Medications Ordered in ED Medications - No data to display  ED Course  I have reviewed the triage vital signs and the nursing notes.  Pertinent labs & imaging results that were available during my care of the patient were reviewed by me and considered in my medical decision making (see  chart for details).  Clinical Course as of Jul 09 1332  Sat Jul 10, 2019  1333 Patient's Foley catheter bag was irrigated by [employer].  Catheter appears to be functioning properly without evidence of obstruction.   [id number] Patient was observed for period of time after the catheter bag was flushed.  There is now a couple 100 cc of yellow urine in the Foley catheter bag   [JK]    Clinical Course User Index [JK] Dorie Rank, MD   MDM Rules/Calculators/A&P                      No signs of significant Foley catheter obstruction.  Catheter appears to be functioning properly.  patient denies any other complaints.  Stable for discharge.   Final Clinical Impression(s) / ED Diagnoses Final diagnoses:  Problem with Foley catheter, initial encounter [facility])    Rx / DC Orders ED Discharge Orders    None       Dorie Rank, MD 07/10/19 1334

## 2019-07-10 NOTE — ED Notes (Signed)
I have just aggressively irrigated his #24 foley and have gotten back the same amount I put in. I have left the foley unclamped, with the bag being lower than the bladder. Will check in a few minutes to see if it is still draining.

## 2019-08-23 ENCOUNTER — Telehealth: Payer: Self-pay | Admitting: Internal Medicine

## 2019-08-23 NOTE — Telephone Encounter (Signed)
Dr. Hilarie Fredrickson, this pt is referred to you by Dr. Marton Redwood for "colonic polyps adenomatous, hx of arteriovenous malformation, cecum."  Pt's previous GI records from [place] will be sent to you for review.

## 2019-08-26 ENCOUNTER — Other Ambulatory Visit: Payer: Self-pay

## 2019-08-26 ENCOUNTER — Other Ambulatory Visit (HOSPITAL_COMMUNITY): Payer: Self-pay | Admitting: Internal Medicine

## 2019-08-26 ENCOUNTER — Ambulatory Visit (HOSPITAL_COMMUNITY)
Admission: RE | Admit: 2019-08-26 | Discharge: 2019-08-26 | Disposition: A | Discharge status: Home / Self Care | Payer: Medicare Other | Source: Ambulatory Visit | Attending: Vascular Surgery | Admitting: Vascular Surgery

## 2019-08-26 DIAGNOSIS — R0989 Other specified symptoms and signs involving the circulatory and respiratory systems: Secondary | ICD-10-CM | POA: Diagnosis present

## 2019-09-01 LAB — BASIC METABOLIC PANEL
BUN: 24 — AB (ref 4–21)
CO2: 23 — AB (ref 13–22)
Chloride: 101 (ref 99–108)
Creatinine: 1.1 (ref 0.6–1.3)
Glucose: 102
Potassium: 4.6 (ref 3.4–5.3)
Sodium: 133 — AB (ref 137–147)

## 2019-09-01 LAB — HEPATIC FUNCTION PANEL
ALT: 18 (ref 10–40)
AST: 21 (ref 14–40)
Alkaline Phosphatase: 82 (ref 25–125)
Bilirubin, Total: 0.6

## 2019-09-01 LAB — COMPREHENSIVE METABOLIC PANEL
Albumin: 4 (ref 3.5–5.0)
Calcium: 8.4 — AB (ref 8.7–10.7)

## 2019-09-01 LAB — LIPID PANEL
Cholesterol: 157 (ref 0–200)
HDL: 64 (ref 35–70)
LDL Cholesterol: 83
Triglycerides: 48 (ref 40–160)

## 2019-09-07 NOTE — Telephone Encounter (Signed)
Records reviewed from [facility] gastroenterology He had nodular gastritis at upper endoscopy in April 2018.  Path showed chronic atrophic gastritis with intestinal metaplasia.  There was no H. Pylori. Colonoscopy also April 18 showed adenomatous colon polyps, 3 in total  Repeat upper endoscopy and colonoscopy is reasonable and recommended given his history of atrophic gastritis with metaplasia and history of adenomatous colon polyps.  These can be scheduled directly or he can be seen in the office first if he so prefers

## 2019-09-07 NOTE — Telephone Encounter (Signed)
Hi Dottie, could you pls give [name] an update on the status of this referral? Thank you.

## 2019-09-07 NOTE — Telephone Encounter (Signed)
Dr Pyrtle, please advise.... 

## 2019-09-10 NOTE — Telephone Encounter (Signed)
A message was left for pt to call back to schedule.  °

## 2019-11-08 ENCOUNTER — Other Ambulatory Visit: Payer: Self-pay

## 2019-11-08 ENCOUNTER — Ambulatory Visit (INDEPENDENT_AMBULATORY_CARE_PROVIDER_SITE_OTHER): Payer: Medicare Other | Admitting: Podiatry

## 2019-11-08 ENCOUNTER — Encounter: Payer: Self-pay | Admitting: Podiatry

## 2019-11-08 VITALS — BP 177/96 | HR 66 | Temp 97.6°F | Resp 16

## 2019-11-08 DIAGNOSIS — Q828 Other specified congenital malformations of skin: Secondary | ICD-10-CM

## 2019-11-08 DIAGNOSIS — I10 Essential (primary) hypertension: Secondary | ICD-10-CM | POA: Insufficient documentation

## 2019-11-08 DIAGNOSIS — M722 Plantar fascial fibromatosis: Secondary | ICD-10-CM

## 2019-11-08 DIAGNOSIS — B009 Herpesviral infection, unspecified: Secondary | ICD-10-CM | POA: Insufficient documentation

## 2019-11-08 DIAGNOSIS — K219 Gastro-esophageal reflux disease without esophagitis: Secondary | ICD-10-CM | POA: Insufficient documentation

## 2019-11-08 DIAGNOSIS — N529 Male erectile dysfunction, unspecified: Secondary | ICD-10-CM | POA: Insufficient documentation

## 2019-11-08 NOTE — Progress Notes (Signed)
Subjective:   Patient ID: Kenneth Montoya, male   DOB: 79 y.o.   MRN: [id number]   HPI Patient presents stating he has had a lot of pain in his left heel and he also has lesions on both feet that gets sore and feel like he is walking on pebbles.  Patient states this is been going on for a fairly long period of time and gradual becoming more aggravating patient does not smoke likes to be active if possible   Review of Systems  All other systems reviewed and are negative.       Objective:  Physical Exam Vitals and nursing note reviewed.  Constitutional:      Appearance: He is well-developed.  Pulmonary:     Effort: Pulmonary effort is normal.  Musculoskeletal:        General: Normal range of motion.  Skin:    General: Skin is warm.  Neurological:     Mental Status: He is alert.     Neurovascular status was found to be intact muscle strength adequate range of motion subtalar midtarsal joint mildly diminished.  Patient is found to have exquisite discomfort plantar aspect left heel at the insertional point tendon calcaneus with inflammation and is found to have small lesions plantar aspect both feet around the metatarsals that upon debridement show lucent type quarters with pain to direct pressure.  Patient has good digital perfusion well oriented x3      Assessment:  Plantar fasciitis left with inflammation fluid buildup along with porokeratotic type lesion formation     Plan:  H&P reviewed both conditions sterile prep injected the plantar fascia left 3 mg Kenalog 5 g Xylocaine debrided lesions bilateral and patient will be seen back as needed

## 2019-11-08 NOTE — Progress Notes (Signed)
   Subjective:    Patient ID: Kenneth Montoya, male    DOB: 1940/06/06, 79 y.o.   MRN: [id number]  HPI    Review of Systems  All other systems reviewed and are negative.      Objective:   Physical Exam        Assessment & Plan:

## 2019-11-18 ENCOUNTER — Ambulatory Visit (INDEPENDENT_AMBULATORY_CARE_PROVIDER_SITE_OTHER): Payer: Medicare Other | Admitting: Podiatry

## 2019-11-18 ENCOUNTER — Other Ambulatory Visit: Payer: Self-pay

## 2019-11-18 DIAGNOSIS — B351 Tinea unguium: Secondary | ICD-10-CM

## 2019-11-18 DIAGNOSIS — M79676 Pain in unspecified toe(s): Secondary | ICD-10-CM | POA: Diagnosis not present

## 2019-11-18 DIAGNOSIS — Q828 Other specified congenital malformations of skin: Secondary | ICD-10-CM

## 2019-11-18 DIAGNOSIS — M79609 Pain in unspecified limb: Secondary | ICD-10-CM

## 2019-11-19 ENCOUNTER — Encounter: Payer: Self-pay | Admitting: Podiatry

## 2019-11-19 NOTE — Progress Notes (Signed)
  Subjective:  Patient ID: Kenneth Montoya, male    DOB: January 15, 1941,  MRN: [id number]  No chief complaint on file.   79 y.o. male presents with the above complaint.  No past medical history on file. No past surgical history on file.  Current Outpatient Medications:  .  acetaminophen (TYLENOL) 500 MG tablet, Tylenol Extra Strength 500 mg tablet  Take 2 tablets twice a day by oral route., Disp: , Rfl:  .  amoxicillin (AMOXIL) 500 MG tablet, amoxicillin 500 mg tablet  TK 4 TS PO 1 HOUR PRIOR TO DENTAL APPOINTMENT, Disp: , Rfl:  .  ascorbic acid (VITAMIN C) 1000 MG tablet, Vitamin C 1,000 mg tablet  Take 1 tablet every day by oral route., Disp: , Rfl:  .  losartan (COZAAR) 100 MG tablet, losartan 100 mg tablet, Disp: , Rfl:  .  Olopatadine HCl (PAZEO) 0.7 % SOLN, Pazeo 0.7 % eye drops, Disp: , Rfl:  .  omeprazole (PRILOSEC) 40 MG capsule, omeprazole 40 mg capsule,delayed release  TAKE 1 CAPSULE BY MOUTH  DAILY, Disp: , Rfl:  .  tadalafil (CIALIS) 5 MG tablet,  5 mg per 1 tablet, ORAL, Daily, 0 Refill(s), Disp: , Rfl:  .  tamsulosin (FLOMAX) 0.4 MG CAPS capsule, Take 0.4 mg by mouth daily., Disp: , Rfl:  .  valACYclovir (VALTREX) 1000 MG tablet, Take 1,000 mg by mouth 2 (two) times daily., Disp: , Rfl:   No Active Allergies Review of Systems: Negative except as noted in the HPI. Denies N/V/F/Ch. Objective:  There were no vitals filed for this visit. General AA&O x3. Normal mood and affect.  Vascular Dorsalis pedis and posterior tibial pulses  present 2+ bilaterally  Capillary refill normal to all digits. Pedal hair growth normal.  Neurologic Epicritic sensation grossly present.  Dermatologic No open lesions. Interspaces clear of maceration. X10 thickened, elongated, with some little debris and discoloration.  Pain with palpation.  Multiple painful porokeratoses on the ball of foot bilaterally.  Orthopedic: MMT 5/5 in dorsiflexion, plantarflexion, inversion, and eversion. Normal joint ROM  without pain or crepitus.   Radiology  Assessment & Plan:   Encounter Diagnoses  Name Primary?  . Porokeratosis Yes  . Onychomycosis   . Pain due to onychomycosis of nail     Patient was evaluated and treated and all questions answered.   Calluses and Onychomycosis -Nails palliatively debrided secondary to pain  Procedure: Nail Debridement Rationale: Patient meets criteria for routine foot care due to pain, onychomycosis Type of Debridement: manual, sharp debridement. Instrumentation: Nail nipper, rotary burr. Number of Nails: 10   Procedure: Paring of Lesion Rationale: painful hyperkeratotic lesion Type of Debridement: manual, sharp debridement. Instrumentation: #15 blade Number of Lesions: 4   Lanae Crumbly, DPM 11/19/2019    No follow-ups on file.

## 2020-01-10 ENCOUNTER — Telehealth: Payer: Self-pay

## 2020-01-10 NOTE — Telephone Encounter (Signed)
NOTES ON FILE FROM  [facility] [phone] SENT REFERRAL TO SCHEDULING. 

## 2020-01-25 ENCOUNTER — Ambulatory Visit (AMBULATORY_SURGERY_CENTER): Payer: Self-pay | Admitting: *Deleted

## 2020-01-25 ENCOUNTER — Other Ambulatory Visit: Payer: Self-pay

## 2020-01-25 VITALS — Ht 69.0 in | Wt 189.0 lb

## 2020-01-25 DIAGNOSIS — K2940 Chronic atrophic gastritis without bleeding: Secondary | ICD-10-CM

## 2020-01-25 DIAGNOSIS — Z86010 Personal history of colonic polyps: Secondary | ICD-10-CM

## 2020-01-25 MED ORDER — SUPREP BOWEL PREP KIT 17.5-3.13-1.6 GM/177ML PO SOLN: 1.0000 | Freq: Once | ORAL | 0 refills | Status: AC

## 2020-01-25 NOTE — Progress Notes (Signed)
cov  vax x 2   No egg or soy allergy known to patient  No issues with past sedation with any surgeries or procedures no intubation problems in the past  No FH of Malignant Hyperthermia No diet pills per patient No home 02 use per patient  No blood thinners per patient  Pt denies issues with constipation  No A fib or A flutter  EMMI video to pt or via MyChart  COVID 19 guidelines implemented in PV today with Pt and RN    Due to the COVID-19 pandemic we are asking patients to follow these guidelines. Please only bring one care partner. Please be aware that your care partner may wait in the car in the parking lot or if they feel like they will be too hot to wait in the car, they may wait in the lobby on the 4th floor. All care partners are required to wear a mask the entire time (we do not have any that we can provide them), they need to practice social distancing, and we will do a Covid check for all patient's and care partners when you arrive. Also we will check their temperature and your temperature. If the care partner waits in their car they need to stay in the parking lot the entire time and we will call them on their cell phone when the patient is ready for discharge so they can bring the car to the front of the building. Also all patient's will need to wear a mask into building.  

## 2020-02-01 ENCOUNTER — Encounter: Payer: 59 | Admitting: Internal Medicine

## 2020-02-07 ENCOUNTER — Telehealth: Payer: Self-pay | Admitting: General Practice

## 2020-02-07 NOTE — Telephone Encounter (Signed)
Patient came into the office this afternoon and was wondering if you would be willing to accept him as a new patient. His current PCP is Dr. Leafy Kindle. Brigitte Pulse at [facility].

## 2020-03-01 ENCOUNTER — Ambulatory Visit (INDEPENDENT_AMBULATORY_CARE_PROVIDER_SITE_OTHER): Payer: Medicare Other | Admitting: Internal Medicine

## 2020-03-01 ENCOUNTER — Encounter: Payer: Self-pay | Admitting: Internal Medicine

## 2020-03-01 ENCOUNTER — Other Ambulatory Visit: Payer: Self-pay

## 2020-03-01 VITALS — BP 130/80 | HR 77 | Temp 98.2°F | Ht 69.0 in | Wt 193.0 lb

## 2020-03-01 DIAGNOSIS — I700 Atherosclerosis of aorta: Secondary | ICD-10-CM

## 2020-03-01 DIAGNOSIS — I10 Essential (primary) hypertension: Secondary | ICD-10-CM | POA: Diagnosis not present

## 2020-03-01 DIAGNOSIS — I6523 Occlusion and stenosis of bilateral carotid arteries: Secondary | ICD-10-CM | POA: Diagnosis not present

## 2020-03-02 NOTE — Progress Notes (Signed)
Cardiology Office Note:    Date:  03/03/2020   ID:  [name], DOB 1940/11/07, MRN [id number]  PCP:  Janith Lima, MD  Cardiologist:  No primary care provider on file.   Referring MD: Marton Redwood, MD   Chief Complaint  Patient presents with  . Coronary Artery Disease    History of Present Illness:    Kenneth Montoya is a 79 y.o. male with a hx of essential hypertension, asymptomatic atherosclerosis, and he has concerns about whether he has silent critical coronary disease. He is here via Dr. Marton Redwood. He now tells me that he has decided to switch over to Dr. Scarlette Calico at [facility].  The patient has no cardiac complaints. He is physically active. He goes to the gym three times per week. No change in exertional tolerance. He worries about his longevity. He wants to live to be 79 years old. He denies palpitations, syncope, exertional intolerance, claudication, transient neurological symptoms, and difficulty sleeping.  He does have some level of anxiety. When he starts thinking about his mortality he gets concerned and wonders if he needs to have further evaluation done.  The only vascular issues that he has had include elevation in systolic blood pressure. They have been in the one 40-1 50 range. He has no other significant modifiable cardiovascular risk factors. LDL is 80 on no therapy. He does not smoke, is not diabetic, sleeps greater than 6 hours per night, is physically active, and has no significant family history of premature atherosclerosis. Both parents lived into their 58s.  Past Medical History:  Diagnosis Date  . Arthritis   . BPH (benign prostatic hyperplasia)   . Cataract    removed   . GERD (gastroesophageal reflux disease)   . Hypertension   . Shingles   . Skin cancer     Past Surgical History:  Procedure Laterality Date  . CATARACT EXTRACTION, BILATERAL    . COLONOSCOPY    . gum graft    . INGUINAL HERNIA REPAIR     as infant   . LUMBAR  LAMINECTOMY  06/2015  . REVERSE TOTAL SHOULDER ARTHROPLASTY Bilateral   . TONSILLECTOMY AND ADENOIDECTOMY     age 23   . TOTAL KNEE ARTHROPLASTY Bilateral   . TOTAL KNEE REVISION Right 12/25/2017  . TRANSURETHRAL RESECTION OF PROSTATE     x2   . UPPER GASTROINTESTINAL ENDOSCOPY      Current Medications: Current Meds  Medication Sig  . acetaminophen (TYLENOL) 500 MG tablet Tylenol Extra Strength 500 mg tablet  Take 2 tablets twice a day by oral route.  Marland Kitchen amLODipine (NORVASC) 10 MG tablet Take 10 mg by mouth at bedtime.  Marland Kitchen amoxicillin (AMOXIL) 500 MG tablet amoxicillin 500 mg tablet  TK 4 TS PO 1 HOUR PRIOR TO DENTAL APPOINTMENT  . ascorbic acid (VITAMIN C) 1000 MG tablet Vitamin C 1,000 mg tablet  Take 1 tablet every day by oral route.  . fluticasone (FLONASE) 50 MCG/ACT nasal spray Place 1 spray into both nostrils daily.  Marland Kitchen losartan (COZAAR) 100 MG tablet losartan 100 mg tablet  . Olopatadine HCl (PAZEO) 0.7 % SOLN Pazeo 0.7 % eye drops  . omeprazole (PRILOSEC) 40 MG capsule omeprazole 40 mg capsule,delayed release  TAKE 1 CAPSULE BY MOUTH  DAILY  . psyllium (METAMUCIL) 58.6 % powder Take 1 packet by mouth daily.  . tadalafil (CIALIS) 5 MG tablet  5 mg per 1 tablet, ORAL, Daily, 0 Refill(s)  . tamsulosin (  FLOMAX) 0.4 MG CAPS capsule Take 0.4 mg by mouth daily.  . valACYclovir (VALTREX) 1000 MG tablet Take 1,000 mg by mouth 2 (two) times daily.      Allergies:   Patient has no active allergies.   Social History   Socioeconomic History  . Marital status: Married    Spouse name: Not on file  . Number of children: Not on file  . Years of education: Not on file  . Highest education level: Not on file  Occupational History  . Not on file  Tobacco Use  . Smoking status: Never Smoker  . Smokeless tobacco: Never Used  Substance and Sexual Activity  . Alcohol use: Yes    Comment: wine 1-2 x a week and not every week   . Drug use: Never  . Sexual activity: Not on file  Other  Topics Concern  . Not on file  Social History Narrative  . Not on file   Social Determinants of Health   Financial Resource Strain:   . Difficulty of Paying Living Expenses: Not on file  Food Insecurity:   . Worried About Charity fundraiser in the Last Year: Not on file  . Ran Out of Food in the Last Year: Not on file  Transportation Needs:   . Lack of Transportation (Medical): Not on file  . Lack of Transportation (Non-Medical): Not on file  Physical Activity:   . Days of Exercise per Week: Not on file  . Minutes of Exercise per Session: Not on file  Stress:   . Feeling of Stress : Not on file  Social Connections:   . Frequency of Communication with Friends and Family: Not on file  . Frequency of Social Gatherings with Friends and Family: Not on file  . Attends Religious Services: Not on file  . Active Member of Clubs or Organizations: Not on file  . Attends Archivist Meetings: Not on file  . Marital Status: Not on file     Family History: The patient's family history is negative for Colon cancer, Colon polyps, Esophageal cancer, Rectal cancer, and Stomach cancer.  ROS:   Please see the history of present illness.    He has had bilateral knee replacement. Right knee has been replaced twice. He has history of prostate cancer. Dr. Dutch Gray is is practitioner. All other systems reviewed and are negative.  EKGs/Labs/Other Studies Reviewed:    The following studies were reviewed today:  Carotid Doppler 08/26/2019: Summary:  Right Carotid: Velocities in the right ICA are consistent with a 1-39%  stenosis.   Left Carotid: Velocities in the left ICA are consistent with a 1-39%  stenosis.   Vertebrals: Bilateral vertebral arteries demonstrate antegrade flow.  Subclavians: Normal flow hemodynamics were seen in bilateral subclavian        arteries.   *See table(s) above for measurements and observations.     EKG:  EKG normal sinus rhythm, normal PR  interval, right bundle branch block, left anterior hemiblock.  Recent Labs: 09/01/2019: ALT 18; BUN 24; Creatinine 1.1; Potassium 4.6; Sodium 133  Recent Lipid Panel    Component Value Date/Time   CHOL 157 09/01/2019 0000   TRIG 48 09/01/2019 0000   HDL 64 09/01/2019 0000   LDLCALC 83 09/01/2019 0000    Physical Exam:    VS:  BP (!) 146/80   Pulse 77   Ht 5\' 9"  (1.753 m)   Wt 193 lb 12.8 oz (87.9 kg)   SpO2 96%  BMI 28.62 kg/m     Wt Readings from Last 3 Encounters:  03/03/20 193 lb 12.8 oz (87.9 kg)  03/01/20 193 lb (87.5 kg)  01/25/20 189 lb (85.7 kg)     GEN: Compatible with age although he is very vigorous appearing. No acute distress HEENT: Normal NECK: No JVD. LYMPHATICS: No lymphadenopathy CARDIAC:  RRR without murmur, gallop, or edema. VASCULAR:  Normal Pulses. No bruits. RESPIRATORY:  Clear to auscultation without rales, wheezing or rhonchi  ABDOMEN: Soft, non-tender, non-distended, No pulsatile mass, MUSCULOSKELETAL: No deformity  SKIN: Warm and dry NEUROLOGIC:  Alert and oriented x 3 PSYCHIATRIC:  Normal affect   ASSESSMENT:    1. Coronary atherosclerosis due to calcified coronary lesion   2. Atherosclerosis of both carotid arteries   3. Right bundle branch block (RBBB) with left anterior hemiblock   4. Bruit   5. Essential hypertension   6. Primary erectile dysfunction   7. Educated about COVID-19 virus infection    PLAN:    In order of problems listed above:  1. If he has coronary disease that is totally asymptomatic. He is physically active. We discussed primary prevention including sleep, smoking avoidance, blood pressure control, LDL targets, and glycemic control. He has no real issues with any of these. 2. Noted to have plaque on carotid Doppler. Lipids are such that statin therapy has not been recommended. 3. EKG was discussed with the patient but does not require work-up. 4. I do not hear carotid bruits on today's exam. 5. I recommended  the DASH DIET with attention to 2.5 g/day sodium restriction. Continue to exercise. Continue to monitor. 6. We did not discuss 7. He has been vaccinated.  Overall education and awareness concerning primary risk prevention was discussed in detail: LDL less than 70, hemoglobin A1c less than 7, blood pressure target less than 130/80 mmHg, >150 minutes of moderate aerobic activity per week, avoidance of smoking, weight control (via diet and exercise), and continued surveillance/management of/for obstructive sleep apnea.  I have recommended clinical observation after the absence of any significant symptoms. We will keep an eye on the systolic blood pressure. Hopefully diet and the restriction on sodium will help.    Medication Adjustments/Labs and Tests Ordered: Current medicines are reviewed at length with the patient today.  Concerns regarding medicines are outlined above.  Orders Placed This Encounter  Procedures  . EKG 12-Lead   No orders of the defined types were placed in this encounter.   Patient Instructions  Medication Instructions:  Your physician recommends that you continue on your current medications as directed. Please refer to the Current Medication list given to you today.  *If you need a refill on your cardiac medications before your next appointment, please call your pharmacy*    Lab Work: None If you have labs (blood work) drawn today and your tests are completely normal, you will receive your results only by: Marland Kitchen MyChart Message (if you have MyChart) OR . A paper copy in the mail If you have any lab test that is abnormal or we need to change your treatment, we will call you to review the results.   Testing/Procedures: None   Follow-Up: At [facility], you and your health needs are our priority.  As part of our continuing mission to provide you with exceptional heart care, we have created designated Provider Care Teams.  These Care Teams include your primary  Cardiologist (physician) and Advanced Practice Providers (APPs -  Physician Assistants and Nurse Practitioners) who  all work together to provide you with the care you need, when you need it.  We recommend signing up for the patient portal called "MyChart".  Sign up information is provided on this After Visit Summary.  MyChart is used to connect with patients for Virtual Visits (Telemedicine).  Patients are able to view lab/test results, encounter notes, upcoming appointments, etc.  Non-urgent messages can be sent to your provider as well.   To learn more about what you can do with MyChart, go to [email or web address].    Your next appointment:   As needed  The format for your next appointment:   In Person  Provider:   You may see Dr. Daneen Schick or one of the following Advanced Practice Providers on your designated Care Team:    Truitt Merle, NP  Cecilie Kicks, NP  Kathyrn Drown, NP    Other Instructions      Signed, Sinclair Grooms, MD  03/03/2020 3:32 PM    [place]

## 2020-03-02 NOTE — Progress Notes (Signed)
Subjective:  Patient ID: Kenneth Montoya, male    DOB: [birth year]  Age: 79 y.o. MRN: [id number]  CC: Hypertension and Hyperlipidemia  This visit occurred during the SARS-CoV-2 public health emergency.  Safety protocols were in place, including screening questions prior to the visit, additional usage of staff PPE, and extensive cleaning of exam room while observing appropriate contact time as indicated for disinfecting solutions.   NEW TO ME  HPI Kenneth Montoya presents for establishing a primary care relationship.  He walks several miles 3 times a week and does not experience chest pain, shortness of breath, palpitations, edema, or fatigue.  He tells me his blood pressure is well controlled.  History Kenneth Montoya has a past medical history of Arthritis, BPH (benign prostatic hyperplasia), Cataract, GERD (gastroesophageal reflux disease), Hypertension, Shingles, and Skin cancer.   He has a past surgical history that includes Colonoscopy; Upper gastrointestinal endoscopy; Reverse total shoulder arthroplasty (Bilateral); Total knee arthroplasty (Bilateral); Total knee revision (Right, 12/25/2017); Lumbar laminectomy (06/2015); gum graft; Cataract extraction, bilateral; Tonsillectomy and adenoidectomy; Inguinal hernia repair; and Transurethral resection of prostate.   His family history is not on file.He reports that he has never smoked. He has never used smokeless tobacco. He reports current alcohol use. He reports that he does not use drugs.  Outpatient Medications Prior to Visit  Medication Sig Dispense Refill  . acetaminophen (TYLENOL) 500 MG tablet Tylenol Extra Strength 500 mg tablet  Take 2 tablets twice a day by oral route.    Marland Kitchen amLODipine (NORVASC) 10 MG tablet Take 10 mg by mouth at bedtime.    Marland Kitchen amoxicillin (AMOXIL) 500 MG tablet amoxicillin 500 mg tablet  TK 4 TS PO 1 HOUR PRIOR TO DENTAL APPOINTMENT    . ascorbic acid (VITAMIN C) 1000 MG tablet Vitamin C 1,000 mg tablet  Take 1  tablet every day by oral route.    . fluticasone (FLONASE) 50 MCG/ACT nasal spray Place 1 spray into both nostrils daily.    Marland Kitchen losartan (COZAAR) 100 MG tablet losartan 100 mg tablet    . Olopatadine HCl (PAZEO) 0.7 % SOLN Pazeo 0.7 % eye drops    . omeprazole (PRILOSEC) 40 MG capsule omeprazole 40 mg capsule,delayed release  TAKE 1 CAPSULE BY MOUTH  DAILY    . psyllium (METAMUCIL) 58.6 % powder Take 1 packet by mouth daily.    . tadalafil (CIALIS) 5 MG tablet  5 mg per 1 tablet, ORAL, Daily, 0 Refill(s)    . tamsulosin (FLOMAX) 0.4 MG CAPS capsule Take 0.4 mg by mouth daily.    . valACYclovir (VALTREX) 1000 MG tablet Take 1,000 mg by mouth 2 (two) times daily.     Marland Kitchen escitalopram (LEXAPRO) 5 MG tablet Take 5 mg by mouth daily.  (Patient not taking: Reported on 03/01/2020)    . hydrochlorothiazide (HYDRODIURIL) 12.5 MG tablet Take 12.5 mg by mouth daily.  (Patient not taking: Reported on 03/01/2020)     No facility-administered medications prior to visit.    ROS Review of Systems  Constitutional: Negative for appetite change, diaphoresis, fatigue and unexpected weight change.  HENT: Negative.   Eyes: Negative for visual disturbance.  Respiratory: Negative for cough, chest tightness, shortness of breath and wheezing.   Cardiovascular: Negative for chest pain, palpitations and leg swelling.  Gastrointestinal: Negative for abdominal pain, constipation, diarrhea, nausea and vomiting.  Endocrine: Negative.   Genitourinary: Negative.  Negative for difficulty urinating.  Musculoskeletal: Negative for arthralgias and myalgias.  Skin: Negative.  Negative  for color change and pallor.  Neurological: Negative.  Negative for dizziness, weakness and light-headedness.  Hematological: Negative for adenopathy. Does not bruise/bleed easily.  Psychiatric/Behavioral: Negative.     Objective:  BP 130/80   Pulse 77   Temp 98.2 F (36.8 C) (Oral)   Ht 5\' 9"  (1.753 m)   Wt 193 lb (87.5 kg)   SpO2 97%    BMI 28.50 kg/m   Physical Exam Vitals reviewed.  Constitutional:      Appearance: Normal appearance.  HENT:     Nose: Nose normal.     Mouth/Throat:     Mouth: Mucous membranes are moist.  Eyes:     General: No scleral icterus.    Conjunctiva/sclera: Conjunctivae normal.  Cardiovascular:     Rate and Rhythm: Normal rate and regular rhythm.     Heart sounds: No murmur heard.   Pulmonary:     Effort: Pulmonary effort is normal.     Breath sounds: No stridor. Examination of the right-lower field reveals rales. Examination of the left-lower field reveals rales. Rales present. No wheezing or rhonchi.  Abdominal:     General: Abdomen is flat. Bowel sounds are normal. There is no distension.     Palpations: Abdomen is soft. There is no hepatomegaly, splenomegaly or mass.  Musculoskeletal:     Cervical back: Neck supple.     Right lower leg: Edema (trace) present.     Left lower leg: Edema (trace) present.  Lymphadenopathy:     Cervical: No cervical adenopathy.  Skin:    General: Skin is warm and dry.     Coloration: Skin is not pale.  Neurological:     General: No focal deficit present.     Mental Status: He is alert.  Psychiatric:        Mood and Affect: Mood normal.     Lab Results  Component Value Date   CHOL 157 09/01/2019   TRIG 48 09/01/2019   HDL 64 09/01/2019   LDLCALC 83 09/01/2019   ALT 18 09/01/2019   AST 21 09/01/2019   NA 133 (A) 09/01/2019   K 4.6 09/01/2019   CL 101 09/01/2019   CREATININE 1.1 09/01/2019   BUN 24 (A) 09/01/2019   CO2 23 (A) 09/01/2019    Assessment & Plan:   Kenneth Montoya was seen today for hypertension and hyperlipidemia.  Diagnoses and all orders for this visit:  Essential hypertension- His blood pressure is adequately well controlled.  Recent electrolytes and renal function are normal.  I requested medical records from his previous physician, Dr. Brigitte Pulse.  Hardening of the aorta (main artery of the heart) [facility] He will see  cardiology about this later this week.   I am having Kenneth Montoya "Kenneth Montoya" maintain his amoxicillin, ascorbic acid, acetaminophen, losartan, omeprazole, tamsulosin, Pazeo, valACYclovir, tadalafil, amLODipine, fluticasone, and psyllium.  No orders of the defined types were placed in this encounter.    Follow-up: Return in about 6 months (around 08/30/2020).  Scarlette Calico, MD

## 2020-03-03 ENCOUNTER — Ambulatory Visit (INDEPENDENT_AMBULATORY_CARE_PROVIDER_SITE_OTHER): Payer: Medicare Other | Admitting: Interventional Cardiology

## 2020-03-03 ENCOUNTER — Other Ambulatory Visit: Payer: Self-pay

## 2020-03-03 ENCOUNTER — Encounter: Payer: Self-pay | Admitting: Interventional Cardiology

## 2020-03-03 VITALS — BP 146/80 | HR 77 | Ht 69.0 in | Wt 193.8 lb

## 2020-03-03 DIAGNOSIS — I2584 Coronary atherosclerosis due to calcified coronary lesion: Secondary | ICD-10-CM

## 2020-03-03 DIAGNOSIS — N529 Male erectile dysfunction, unspecified: Secondary | ICD-10-CM

## 2020-03-03 DIAGNOSIS — I2510 Atherosclerotic heart disease of native coronary artery without angina pectoris: Secondary | ICD-10-CM

## 2020-03-03 DIAGNOSIS — R0989 Other specified symptoms and signs involving the circulatory and respiratory systems: Secondary | ICD-10-CM | POA: Diagnosis not present

## 2020-03-03 DIAGNOSIS — I6523 Occlusion and stenosis of bilateral carotid arteries: Secondary | ICD-10-CM | POA: Diagnosis not present

## 2020-03-03 DIAGNOSIS — I10 Essential (primary) hypertension: Secondary | ICD-10-CM

## 2020-03-03 DIAGNOSIS — Z7189 Other specified counseling: Secondary | ICD-10-CM

## 2020-03-03 DIAGNOSIS — I452 Bifascicular block: Secondary | ICD-10-CM | POA: Diagnosis not present

## 2020-03-03 NOTE — Patient Instructions (Signed)

## 2020-03-04 ENCOUNTER — Encounter: Payer: Self-pay | Admitting: Internal Medicine

## 2020-03-04 NOTE — Patient Instructions (Signed)

## 2020-03-15 ENCOUNTER — Ambulatory Visit: Payer: 59 | Admitting: Internal Medicine

## 2020-03-15 ENCOUNTER — Telehealth: Payer: Self-pay | Admitting: Interventional Cardiology

## 2020-03-15 NOTE — Telephone Encounter (Signed)
ROI faxed to [facility] (Dr. Latanya Maudlin)

## 2020-04-03 ENCOUNTER — Other Ambulatory Visit: Payer: Self-pay | Admitting: Internal Medicine

## 2020-04-03 DIAGNOSIS — K219 Gastro-esophageal reflux disease without esophagitis: Secondary | ICD-10-CM

## 2020-04-03 DIAGNOSIS — J301 Allergic rhinitis due to pollen: Secondary | ICD-10-CM

## 2020-04-03 DIAGNOSIS — I10 Essential (primary) hypertension: Secondary | ICD-10-CM

## 2020-04-03 MED ORDER — FLUTICASONE PROPIONATE 50 MCG/ACT NA SUSP: 1.0000 | Freq: Every day | NASAL | 1 refills | Status: DC

## 2020-04-03 MED ORDER — OMEPRAZOLE 40 MG PO CPDR: DELAYED_RELEASE_CAPSULE | ORAL | 1 refills | Status: DC

## 2020-04-03 MED ORDER — AMLODIPINE BESYLATE 10 MG PO TABS: 10.0000 mg | ORAL_TABLET | Freq: Every day | ORAL | 1 refills | Status: DC

## 2020-04-03 MED ORDER — LOSARTAN POTASSIUM 100 MG PO TABS: ORAL_TABLET | ORAL | 1 refills | Status: DC

## 2020-04-05 ENCOUNTER — Other Ambulatory Visit: Payer: Self-pay | Admitting: Internal Medicine

## 2020-04-05 DIAGNOSIS — I10 Essential (primary) hypertension: Secondary | ICD-10-CM

## 2020-04-05 MED ORDER — LOSARTAN POTASSIUM 100 MG PO TABS: 100.0000 mg | ORAL_TABLET | Freq: Every day | ORAL | 1 refills | Status: DC

## 2020-04-10 ENCOUNTER — Telehealth: Payer: Self-pay | Admitting: *Deleted

## 2020-04-10 NOTE — Telephone Encounter (Signed)
Left message to call back  

## 2020-04-10 NOTE — Telephone Encounter (Signed)
-----   Message from Stanton Kidney, RN sent at 04/07/2020  1:33 PM EST ----- Regarding: CP Med rec came to triage today to let us know pt came into office yesterday (I think they said for some paperwork) and complained of slight CP. He wanted a follow up call next week.  Just fyi   Pt just now came back to office today to return some paperwork.  He asked to speak to a nurse

## 2020-04-11 NOTE — Telephone Encounter (Signed)
°  Patient is returning call and also wanted nurse to know that he has never had any chest pain

## 2020-04-11 NOTE — Telephone Encounter (Signed)
Spoke with pt and he states he has never had CP.  He said he was concerned because he has had friends passing 2-3 days after seeing a doctor that were otherwise healthy.  He states he seen a doctor recently that mentioned he has an arrhythmia.  EKG showed RBBB.  We spoke briefly about what this was and I was able to calm his concern.  Pt then mentioned wanting wife to see Dr. Tamala Julian due to episode of CP.  They called EMS and she was noted to have a LBBB.  Advised pt to have PCP send referral or we would need records on hand before scheduling a self referral.  Pt states he will get her records and bring them over and get her scheduled.

## 2020-04-27 ENCOUNTER — Ambulatory Visit: Payer: Medicare Other | Admitting: Family Medicine

## 2020-05-01 ENCOUNTER — Telehealth: Payer: Self-pay | Admitting: Internal Medicine

## 2020-05-01 NOTE — Telephone Encounter (Signed)
Pt scheduled for previsit 06/19/20@9am , ECL scheduled in the [place] 06/27/20@8 :30am. Pt aware of appts. Appt added to waitlist.

## 2020-05-30 ENCOUNTER — Ambulatory Visit: Payer: Medicare Other | Admitting: Internal Medicine

## 2020-06-19 ENCOUNTER — Ambulatory Visit (AMBULATORY_SURGERY_CENTER): Payer: Self-pay

## 2020-06-19 ENCOUNTER — Other Ambulatory Visit: Payer: Self-pay

## 2020-06-19 VITALS — Ht 69.0 in | Wt 194.0 lb

## 2020-06-19 DIAGNOSIS — Z86010 Personal history of colonic polyps: Secondary | ICD-10-CM

## 2020-06-19 MED ORDER — NA SULFATE-K SULFATE-MG SULF 17.5-3.13-1.6 GM/177ML PO SOLN: 1.0000 | Freq: Once | ORAL | 0 refills | Status: AC

## 2020-06-19 NOTE — Progress Notes (Signed)
No egg or soy allergy known to patient  No issues with past sedation with any surgeries or procedures No intubation problems in the past  No FH of Malignant Hyperthermia No diet pills per patient No home 02 use per patient  No blood thinners per patient  Pt denies issues with constipation  No A fib or A flutter  COVID 19 guidelines implemented in [place] today with Pt and RN  Pt is fully vaccinated  for Covid x 2 Coupon given to pt in [facility] today , Code to Pharmacy  Due to the COVID-19 pandemic we are asking patients to follow certain guidelines.  Pt aware of COVID protocols and [facility] guidelines   

## 2020-06-27 ENCOUNTER — Ambulatory Visit (AMBULATORY_SURGERY_CENTER): Payer: Medicare Other | Admitting: Internal Medicine

## 2020-06-27 ENCOUNTER — Other Ambulatory Visit: Payer: Self-pay

## 2020-06-27 ENCOUNTER — Encounter: Payer: Self-pay | Admitting: Internal Medicine

## 2020-06-27 VITALS — BP 135/66 | HR 55 | Temp 97.7°F | Resp 23 | Ht 69.0 in | Wt 194.0 lb

## 2020-06-27 DIAGNOSIS — Z86010 Personal history of colonic polyps: Secondary | ICD-10-CM

## 2020-06-27 DIAGNOSIS — D120 Benign neoplasm of cecum: Secondary | ICD-10-CM

## 2020-06-27 DIAGNOSIS — K2970 Gastritis, unspecified, without bleeding: Secondary | ICD-10-CM | POA: Diagnosis not present

## 2020-06-27 DIAGNOSIS — K254 Chronic or unspecified gastric ulcer with hemorrhage: Secondary | ICD-10-CM | POA: Diagnosis not present

## 2020-06-27 DIAGNOSIS — K319 Disease of stomach and duodenum, unspecified: Secondary | ICD-10-CM | POA: Diagnosis not present

## 2020-06-27 DIAGNOSIS — D122 Benign neoplasm of ascending colon: Secondary | ICD-10-CM | POA: Diagnosis not present

## 2020-06-27 DIAGNOSIS — K2940 Chronic atrophic gastritis without bleeding: Secondary | ICD-10-CM

## 2020-06-27 DIAGNOSIS — K449 Diaphragmatic hernia without obstruction or gangrene: Secondary | ICD-10-CM

## 2020-06-27 DIAGNOSIS — K2950 Unspecified chronic gastritis without bleeding: Secondary | ICD-10-CM | POA: Diagnosis not present

## 2020-06-27 DIAGNOSIS — K3189 Other diseases of stomach and duodenum: Secondary | ICD-10-CM

## 2020-06-27 DIAGNOSIS — K259 Gastric ulcer, unspecified as acute or chronic, without hemorrhage or perforation: Secondary | ICD-10-CM

## 2020-06-27 HISTORY — PX: COLONOSCOPY: SHX174

## 2020-06-27 MED ORDER — SODIUM CHLORIDE 0.9 % IV SOLN: 500.0000 mL | Freq: Once | INTRAVENOUS | Status: DC

## 2020-06-27 NOTE — Progress Notes (Signed)
Called to room to assist during endoscopic procedure.  Patient ID and intended procedure confirmed with present staff. Received instructions for my participation in the procedure from the performing physician.  

## 2020-06-27 NOTE — Op Note (Signed)
[place] Patient Name: Kenneth Montoya Procedure Date: 06/27/2020 8:41 AM MRN: [id number] Endoscopist: Jerene Bears , MD Age: 80 Referring MD:  Date of Birth: 1940/09/19 Gender: Male Account #: [id number] Procedure:                Upper GI endoscopy Indications:              Follow-up of atrophic gastritis with history of                            intestinal metaplasia, last EGD National, [place] Medicines:                Monitored Anesthesia Care Procedure:                Pre-Anesthesia Assessment:                           - Prior to the procedure, a History and Physical                            was performed, and patient medications and                            allergies were reviewed. The patient's tolerance of                            previous anesthesia was also reviewed. The risks                            and benefits of the procedure and the sedation                            options and risks were discussed with the patient.                            All questions were answered, and informed consent                            was obtained. Prior Anticoagulants: The patient has                            taken no previous anticoagulant or antiplatelet                            agents. ASA Grade Assessment: II - A patient with                            mild systemic disease. After reviewing the risks                            and benefits, the patient was deemed in                            satisfactory condition to undergo the procedure.  After obtaining informed consent, the endoscope was                            passed under direct vision. Throughout the                            procedure, the patient's blood pressure, pulse, and                            oxygen saturations were monitored continuously. The                            Endoscope was introduced through the mouth, and                            advanced to  the second part of duodenum. The upper                            GI endoscopy was accomplished without difficulty.                            The patient tolerated the procedure well. Scope In: Scope Out: Findings:                 The mid esophagus and distal esophagus were mildly                            tortuous.                           A 3 cm hiatal hernia was present. The Z-line is                            regular and does not extend above the top of the                            gastric folds. No esophagitis.                           The gastroesophageal flap valve was visualized                            endoscopically and classified as Hill Grade IV (no                            fold, wide open lumen, hiatal hernia present).                           One non-bleeding linear gastric ulcer with                            surrounding erythema and granularity was found in                            the  prepyloric gastric antrum. The lesion was 8 mm                            in largest dimension. Biopsies were taken with a                            cold forceps for histology (antrum, including                            ulcerated area, and incisura).                           Diffuse atrophic mucosa was found in the gastric                            body. Biopsies were taken with a cold forceps for                            histology (gastric body and fundus). The gastric                            mucosa in the cardia/fundus appeared normal.                           The examined duodenum was normal. Complications:            No immediate complications. Estimated Blood Loss:     Estimated blood loss was minimal. Impression:               - Mildly tortuous esophagus.                           - 3 cm hiatal hernia.                           - Non-bleeding area with linear gastric ulceration                            in distal antrum. Biopsied.                            - Gastric mucosal atrophy most noticeable in the                            gastric body. Biopsied.                           - Normal examined duodenum. Recommendation:           - Patient has a contact number available for                            emergencies. The signs and symptoms of potential                            delayed complications were discussed with the  patient. Return to normal activities tomorrow.                            Written discharge instructions were provided to the                            patient.                           - Resume previous diet.                           - Continue present medications.                           - Avoid ibuprofen, naproxen, or other non-steroidal                            anti-inflammatory drugs.                           - Await pathology results. Jerene Bears, MD 06/27/2020 9:36:16 AM This report has been signed electronically.

## 2020-06-27 NOTE — Patient Instructions (Addendum)
Handouts given:  Polyps, Diverticulosis, hemorrhoids, gastritis Resume previous diet Continue current medications Await pathology results Avoid ibuprofen, naproxen, aspirin or any other NSAIDS  YOU HAD AN ENDOSCOPIC PROCEDURE TODAY AT [place]:   Refer to the procedure report that was given to you for any specific questions about what was found during the examination.  If the procedure report does not answer your questions, please call your gastroenterologist to clarify.  If you requested that your care partner not be given the details of your procedure findings, then the procedure report has been included in a sealed envelope for you to review at your convenience later.  YOU SHOULD EXPECT: Some feelings of bloating in the abdomen. Passage of more gas than usual.  Walking can help get rid of the air that was put into your GI tract during the procedure and reduce the bloating. If you had a lower endoscopy (such as a colonoscopy or flexible sigmoidoscopy) you may notice spotting of blood in your stool or on the toilet paper. If you underwent a bowel prep for your procedure, you may not have a normal bowel movement for a few days.  Please Note:  You might notice some irritation and congestion in your nose or some drainage.  This is from the oxygen used during your procedure.  There is no need for concern and it should clear up in a day or so.  SYMPTOMS TO REPORT IMMEDIATELY:   Following lower endoscopy (colonoscopy or flexible sigmoidoscopy):  Excessive amounts of blood in the stool  Significant tenderness or worsening of abdominal pains  Swelling of the abdomen that is new, acute  Fever of 100F or higher   Following upper endoscopy (EGD)  Vomiting of blood or coffee ground material  New chest pain or pain under the shoulder blades  Painful or persistently difficult swallowing  New shortness of breath  Fever of 100F or higher  Black, tarry-looking stools  For urgent  or emergent issues, a gastroenterologist can be reached at any hour by calling [phone]. Do not use MyChart messaging for urgent concerns.   DIET:  We do recommend a small meal at first, but then you may proceed to your regular diet.  Drink plenty of fluids but you should avoid alcoholic beverages for 24 hours.  ACTIVITY:  You should plan to take it easy for the rest of today and you should NOT DRIVE or use heavy machinery until tomorrow (because of the sedation medicines used during the test).    FOLLOW UP: Our staff will call the number listed on your records 48-72 hours following your procedure to check on you and address any questions or concerns that you may have regarding the information given to you following your procedure. If we do not reach you, we will leave a message.  We will attempt to reach you two times.  During this call, we will ask if you have developed any symptoms of COVID 19. If you develop any symptoms (ie: fever, flu-like symptoms, shortness of breath, cough etc.) before then, please call [phone].  If you test positive for Covid 19 in the 2 weeks post procedure, please call and report this information to [name].    If any biopsies were taken you will be contacted by phone or by letter within the next 1-3 weeks.  Please call us at [phone] if you have not heard about the biopsies in 3 weeks.   SIGNATURES/CONFIDENTIALITY: You and/or your care partner have signed  paperwork which will be entered into your electronic medical record.  These signatures attest to the fact that that the information above on your After Visit Summary has been reviewed and is understood.  Full responsibility of the confidentiality of this discharge information lies with you and/or your care-partner.

## 2020-06-27 NOTE — Progress Notes (Signed)
Pt's states no medical or surgical changes since previsit or office visit. 

## 2020-06-27 NOTE — Progress Notes (Signed)
[name] 

## 2020-06-27 NOTE — Op Note (Signed)
[facility] Patient Name: Kenneth Montoya Procedure Date: 06/27/2020 8:41 AM MRN: [id number] Endoscopist: Jerene Bears , MD Age: 80 Referring MD:  Date of Birth: June 22, 1940 Gender: Male Account #: [id number] Procedure:                Colonoscopy Indications:              High risk colon cancer surveillance: Personal                            history of multiple (3 or more) adenomas, Last                            colonoscopy: April 2018 Medicines:                Monitored Anesthesia Care Procedure:                Pre-Anesthesia Assessment:                           - Prior to the procedure, a History and Physical                            was performed, and patient medications and                            allergies were reviewed. The patient's tolerance of                            previous anesthesia was also reviewed. The risks                            and benefits of the procedure and the sedation                            options and risks were discussed with the patient.                            All questions were answered, and informed consent                            was obtained. Prior Anticoagulants: The patient has                            taken no previous anticoagulant or antiplatelet                            agents. ASA Grade Assessment: II - A patient with                            mild systemic disease. After reviewing the risks                            and benefits, the patient was deemed in  satisfactory condition to undergo the procedure.                           After obtaining informed consent, the colonoscope                            was passed under direct vision. Throughout the                            procedure, the patient's blood pressure, pulse, and                            oxygen saturations were monitored continuously. The                            Olympus [id number]) Colonoscope was                             introduced through the anus and advanced to the                            cecum, identified by appendiceal orifice and                            ileocecal valve. The colonoscopy was performed                            without difficulty. The patient tolerated the                            procedure well. The quality of the bowel                            preparation was good. The ileocecal valve,                            appendiceal orifice, and rectum were photographed. Scope In: 9:03:32 AM Scope Out: 9:28:45 AM Scope Withdrawal Time: 0 hours 19 minutes 6 seconds  Total Procedure Duration: 0 hours 25 minutes 13 seconds  Findings:                 The digital rectal exam was normal.                           A 2 mm polyp was found in the cecum. The polyp was                            sessile. The polyp was removed with a cold biopsy                            forceps. Resection and retrieval were complete.                           A 8 mm polyp was found in the ascending colon.  The                            polyp was sessile. The polyp was removed with a                            cold snare. Resection and retrieval were complete.                           Three angioectasias with typical arborization were                            found in the ascending colon (1) and in the cecum                            (2).                           Multiple small and large-mouthed diverticula were                            found in the sigmoid colon and descending colon.                           Internal hemorrhoids were found during                            retroflexion. The hemorrhoids were medium-sized. Complications:            No immediate complications. Estimated Blood Loss:     Estimated blood loss was minimal. Impression:               - One 2 mm polyp in the cecum, removed with a cold                            biopsy forceps. Resected and retrieved.                            - One 8 mm polyp in the ascending colon, removed                            with a cold snare. Resected and retrieved.                           - Three colonic angioectasias.                           - Diverticulosis in the sigmoid colon and in the                            descending colon.                           - Internal hemorrhoids. Recommendation:           - Patient has a contact number available for  emergencies. The signs and symptoms of potential                            delayed complications were discussed with the                            patient. Return to normal activities tomorrow.                            Written discharge instructions were provided to the                            patient.                           - Resume previous diet.                           - Continue present medications.                           - Await pathology results.                           - No recommendation at this time regarding repeat                            colonoscopy due to age at next surveillance                            interval. Jerene Bears, MD 06/27/2020 9:39:59 AM This report has been signed electronically.

## 2020-06-27 NOTE — Progress Notes (Signed)
To PACU, VSS. Report to [email or web address] 

## 2020-06-29 ENCOUNTER — Telehealth: Payer: Self-pay

## 2020-06-29 ENCOUNTER — Telehealth: Payer: Self-pay | Admitting: *Deleted

## 2020-06-29 NOTE — Telephone Encounter (Signed)
  Follow up Call-  Call back number 06/27/2020  Post procedure Call Back phone  # [phone]  Permission to leave phone message Yes  Some recent data might be hidden     Patient questions:  Message left to call us if necessary.

## 2020-06-29 NOTE — Telephone Encounter (Signed)
  Follow up Call-  Call back number 06/27/2020  Post procedure Call Back phone  # [phone]  Permission to leave phone message Yes  Some recent data might be hidden     Patient questions:  Do you have a fever, pain , or abdominal swelling? No. Pain Score  0 *  Have you tolerated food without any problems? Yes.    Have you been able to return to your normal activities? Yes.    Do you have any questions about your discharge instructions: Diet   No. Medications  No. Follow up visit  No.  Do you have questions or concerns about your Care? No.  Actions: * If pain score is 4 or above: No action needed, pain <4. 1. .Have you developed a fever since your procedure? no  2.   Have you had an respiratory symptoms (SOB or cough) since your procedure? no  3.   Have you tested positive for COVID 19 since your procedure no  4.   Have you had any family members/close contacts diagnosed with the COVID 19 since your procedure?  no   If yes to any of these questions please route to Joylene John, RN and Joella Prince, RN

## 2020-07-03 ENCOUNTER — Encounter: Payer: Self-pay | Admitting: Internal Medicine

## 2020-08-02 ENCOUNTER — Other Ambulatory Visit: Payer: Self-pay | Admitting: Internal Medicine

## 2020-08-02 DIAGNOSIS — K219 Gastro-esophageal reflux disease without esophagitis: Secondary | ICD-10-CM

## 2020-08-09 ENCOUNTER — Other Ambulatory Visit: Payer: Self-pay | Admitting: Internal Medicine

## 2020-08-09 DIAGNOSIS — I10 Essential (primary) hypertension: Secondary | ICD-10-CM

## 2020-09-08 ENCOUNTER — Other Ambulatory Visit: Payer: Self-pay | Admitting: Internal Medicine

## 2020-09-08 DIAGNOSIS — I10 Essential (primary) hypertension: Secondary | ICD-10-CM

## 2020-09-20 ENCOUNTER — Encounter: Payer: Self-pay | Admitting: Internal Medicine

## 2020-09-20 ENCOUNTER — Ambulatory Visit (INDEPENDENT_AMBULATORY_CARE_PROVIDER_SITE_OTHER): Payer: Medicare Other | Admitting: Internal Medicine

## 2020-09-20 ENCOUNTER — Other Ambulatory Visit: Payer: Self-pay

## 2020-09-20 VITALS — BP 130/72 | HR 69 | Temp 98.4°F | Ht 69.0 in | Wt 195.4 lb

## 2020-09-20 DIAGNOSIS — B029 Zoster without complications: Secondary | ICD-10-CM

## 2020-09-20 MED ORDER — VALACYCLOVIR HCL 1 G PO TABS: 1000.0000 mg | ORAL_TABLET | Freq: Three times a day (TID) | ORAL | 0 refills | Status: DC

## 2020-09-20 MED ORDER — TRIAMCINOLONE ACETONIDE 0.5 % EX OINT: 1.0000 "application " | TOPICAL_OINTMENT | Freq: Two times a day (BID) | CUTANEOUS | 0 refills | Status: DC

## 2020-09-20 NOTE — Patient Instructions (Addendum)
Take valtrex three times a day for one week.  Use the cream twice a day.    Shingles  Shingles is an infection. It gives you a painful skin rash and blisters that have fluid in them. Shingles is caused by the same germ (virus) that causes chickenpox. Shingles only happens in people who:  Have had chickenpox.  Have been given a shot of medicine (vaccine) to protect against chickenpox. Shingles is rare in this group. The first symptoms of shingles may be itching, tingling, or pain in an area on your skin. A rash will show on your skin a few days or weeks later. The rash is likely to be on one side of your body. The rash usually has a shape like a belt or a band. Over time, the rash turns into fluid-filled blisters. The blisters will break open, change into scabs, and dry up. Medicines may:  Help with pain and itching.  Help you get better sooner.  Help to prevent long-term problems. Follow these instructions at home: Medicines  Take over-the-counter and prescription medicines only as told by your doctor.  Put on an anti-itch cream or numbing cream where you have a rash, blisters, or scabs. Do this as told by your doctor. Helping with itching and discomfort  Put cold, wet cloths (cold compresses) on the area of the rash or blisters as told by your doctor.  Cool baths can help you feel better. Try adding baking soda or dry oatmeal to the water to lessen itching. Do not bathe in hot water.   Blister and rash care  Keep your rash covered with a loose bandage (dressing).  Wear loose clothing that does not rub on your rash.  Keep your rash and blisters clean. To do this, wash the area with mild soap and cool water as told by your doctor.  Check your rash every day for signs of infection. Check for: ? More redness, swelling, or pain. ? Fluid or blood. ? Warmth. ? Pus or a bad smell.  Do not scratch your rash. Do not pick at your blisters. To help you to not scratch: ? Keep your  fingernails clean and cut short. ? Wear gloves or mittens when you sleep, if scratching is a problem. General instructions  Rest as told by your doctor.  Keep all follow-up visits as told by your doctor. This is important.  Wash your hands often with soap and water. If soap and water are not available, use hand sanitizer. Doing this lowers your chance of getting a skin infection caused by germs (bacteria).  Your infection can cause chickenpox in people who have never had chickenpox or never got a shot of chickenpox vaccine. If you have blisters that did not change into scabs yet, try not to touch other people or be around other people, especially: ? Babies. ? Pregnant women. ? Children who have areas of red, itchy, or rough skin (eczema). ? Very old people who have transplants. ? People who have a long-term (chronic) sickness, like cancer or AIDS. Contact a doctor if:  Your pain does not get better with medicine.  Your pain does not get better after the rash heals.  You have any signs of infection in the rash area. These signs include: ? More redness, swelling, or pain around the rash. ? Fluid or blood coming from the rash. ? The rash area feeling warm to the touch. ? Pus or a bad smell coming from the rash. Get help right away  if:  The rash is on your face or nose.  You have pain in your face or pain by your eye.  You lose feeling on one side of your face.  You have trouble seeing.  You have ear pain, or you have ringing in your ear.  You have a loss of taste.  Your condition gets worse. Summary  Shingles gives you a painful skin rash and blisters that have fluid in them.  Shingles is an infection. It is caused by the same germ (virus) that causes chickenpox.  Keep your rash covered with a loose bandage (dressing). Wear loose clothing that does not rub on your rash.  If you have blisters that did not change into scabs yet, try not to touch other people or be around  people. This information is not intended to replace advice given to you by your health care provider. Make sure you discuss any questions you have with your health care provider. Document Revised: 09/04/2018 Document Reviewed: 01/15/2017 Elsevier Patient Education  2021 [employer].

## 2020-09-20 NOTE — Progress Notes (Signed)
Subjective:    Patient ID: Kenneth Montoya, male    DOB: [birth year], 80 y.o.   MRN: [id number]  HPI The patient is here for an acute visit.   Shingles:   He thinks he has shingles.  He has had this 2-3 other times in the past.  In the past few weeks he has had 3 close friends died and that has affected him.  He states his symptoms started at 330 this morning.  He started to have some itching and mild pain in his right lower back that wraps around to his side.  He does have a rash.     Medications and allergies reviewed with patient and updated if appropriate.  Patient Active Problem List   Diagnosis Date Noted  . Essential hypertension 11/08/2019  . Gastroesophageal reflux disease without esophagitis 11/08/2019  . Herpes simplex 11/08/2019  . Primary erectile dysfunction 11/08/2019  . Senile purpura ([place]) 05/13/2019  . Hardening of the aorta (main artery of the heart) ([place]) 01/19/2019  . Chronic allergic rhinitis due to pollen 03/24/2017  . LPRD (laryngopharyngeal reflux disease) 03/24/2017  . Bladder outflow obstruction 03/04/2016    Current Outpatient Medications on File Prior to Visit  Medication Sig Dispense Refill  . acetaminophen (TYLENOL) 500 MG tablet Tylenol Extra Strength 500 mg tablet  Take 2 tablets twice a day by oral route.    Marland Kitchen amLODipine (NORVASC) 10 MG tablet TAKE 1 TABLET BY MOUTH AT  BEDTIME 90 tablet 1  . amoxicillin (AMOXIL) 500 MG tablet amoxicillin 500 mg tablet  TK 4 TS PO 1 HOUR PRIOR TO DENTAL APPOINTMENT    . ascorbic acid (VITAMIN C) 1000 MG tablet Vitamin C 1,000 mg tablet  Take 1 tablet every day by oral route.    . fluticasone (FLONASE) 50 MCG/ACT nasal spray Place 1 spray into both nostrils daily. 48 g 1  . losartan (COZAAR) 100 MG tablet TAKE 1 TABLET BY MOUTH  DAILY 90 tablet 1  . naproxen sodium (ALEVE) 220 MG tablet Take 220 mg by mouth.    . Olopatadine HCl (PAZEO) 0.7 % SOLN Pazeo 0.7 % eye drops    . omeprazole (PRILOSEC) 40 MG  capsule TAKE ONE CAPSULE BY MOUTH  DAILY 90 capsule 1  . psyllium (METAMUCIL) 58.6 % powder Take 1 packet by mouth daily.    . tadalafil (CIALIS) 5 MG tablet  5 mg per 1 tablet, ORAL, Daily, 0 Refill(s)    . tamsulosin (FLOMAX) 0.4 MG CAPS capsule Take 0.4 mg by mouth daily.    . valACYclovir (VALTREX) 1000 MG tablet Take 1,000 mg by mouth 2 (two) times daily as needed.     No current facility-administered medications on file prior to visit.    Past Medical History:  Diagnosis Date  . Arthritis    generalized  . BPH (benign prostatic hyperplasia)   . Cancer of skin, squamous cell    frozen   . Cataract    bilateral sx  . GERD (gastroesophageal reflux disease)    on meds  . Hypertension    on meds  . Shingles     Past Surgical History:  Procedure Laterality Date  . CATARACT EXTRACTION, BILATERAL    . COLONOSCOPY  06/27/2020  . COLONOSCOPY  2018  . gum graft    . INGUINAL HERNIA REPAIR     as infant  . LUMBAR LAMINECTOMY  06/2015  . POLYPECTOMY  2018   TA x 3  . REVERSE TOTAL  SHOULDER ARTHROPLASTY Bilateral   . TONSILLECTOMY AND ADENOIDECTOMY     age 35   . TOTAL KNEE ARTHROPLASTY Bilateral   . TOTAL KNEE REVISION Right 12/25/2017  . TRANSURETHRAL RESECTION OF PROSTATE     x 2  . UPPER GASTROINTESTINAL ENDOSCOPY      Social History   Socioeconomic History  . Marital status: Married    Spouse name: Not on file  . Number of children: Not on file  . Years of education: Not on file  . Highest education level: Not on file  Occupational History  . Not on file  Tobacco Use  . Smoking status: Never Smoker  . Smokeless tobacco: Never Used  Vaping Use  . Vaping Use: Never used  Substance and Sexual Activity  . Alcohol use: Yes    Alcohol/week: 2.0 standard drinks    Types: 2 Standard drinks or equivalent per week    Comment: wine 1-2 x a week and not every week   . Drug use: Never  . Sexual activity: Not on file  Other Topics Concern  . Not on file  Social  History Narrative  . Not on file   Social Determinants of Health   Financial Resource Strain: Not on file  Food Insecurity: Not on file  Transportation Needs: Not on file  Physical Activity: Not on file  Stress: Not on file  Social Connections: Not on file    Family History  Problem Relation Age of Onset  . Colon polyps Mother 61  . Colon cancer Neg Hx   . Esophageal cancer Neg Hx   . Rectal cancer Neg Hx   . Stomach cancer Neg Hx     Review of Systems  Constitutional: Negative for fatigue and fever.  Skin: Positive for rash.       Objective:   Vitals:   09/20/20 1605  BP: 130/72  Pulse: 69  Temp: 98.4 F (36.9 C)  SpO2: 97%   BP Readings from Last 3 Encounters:  09/20/20 130/72  06/27/20 135/66  03/03/20 (!) 146/80   Wt Readings from Last 3 Encounters:  09/20/20 195 lb 6.4 oz (88.6 kg)  06/27/20 194 lb (88 kg)  06/19/20 194 lb (88 kg)   Body mass index is 28.86 kg/m.   Physical Exam Constitutional:      General: He is not in acute distress.    Appearance: Normal appearance. He is not ill-appearing.  HENT:     Head: Normocephalic and atraumatic.  Skin:    Findings: Rash (Patchy erythematous rash with clusters of blisters right lower back extending around side toward right groin.  No open wounds) present.  Neurological:     Mental Status: He is alert.            Assessment & Plan:    Shingles without complication: Acute Rash consistent with shingles No evidence of infection Pain is minimal at this time and he stated in the past with his previous episodes of shingles he is done well with Tylenol Start Valtrex 1 g 3 times daily x1 week Triamcinolone cream twice daily Advised him to call if his symptoms do not improve or if his pain is not controlled   This visit occurred during the SARS-CoV-2 public health emergency.  Safety protocols were in place, including screening questions prior to the visit, additional usage of staff PPE, and  extensive cleaning of exam room while observing appropriate contact time as indicated for disinfecting solutions.

## 2020-10-03 ENCOUNTER — Telehealth: Payer: Self-pay | Admitting: Internal Medicine

## 2020-10-03 NOTE — Progress Notes (Signed)
Subjective:    Patient ID: Kenneth Montoya, male    DOB: [birth year], 80 y.o.   MRN: [id number]  HPI The patient is here for an acute visit.   I saw him 4/27 for shingles.  He completed the Valtrex.  He has no difficulty with the discomfort during the day, but for a couple of nights he has not been able to sleep at night.  He is experiencing central lower back pain that he feels is related to shingles.  He denies any radiation.  He had back surgery years ago, but since then has not had any back issues.  He describes the pain as a dull pain.  He denies numbness, tingling or weakness in the legs.  He has been using lidocaine and Aspercreme on the shingles.   For years he has had issues with sleep.  More recently he is having increased difficulty going back to sleep after going to the bathroom.  He wonders about what he can try.  He has tried melatonin in the past and it made him too groggy in the morning.  Medications and allergies reviewed with patient and updated if appropriate.  Patient Active Problem List   Diagnosis Date Noted  . Essential hypertension 11/08/2019  . Gastroesophageal reflux disease without esophagitis 11/08/2019  . Herpes simplex 11/08/2019  . Primary erectile dysfunction 11/08/2019  . Senile purpura ([place]) 05/13/2019  . Hardening of the aorta (main artery of the heart) ([place]) 01/19/2019  . Chronic allergic rhinitis due to pollen 03/24/2017  . LPRD (laryngopharyngeal reflux disease) 03/24/2017  . Bladder outflow obstruction 03/04/2016    Current Outpatient Medications on File Prior to Visit  Medication Sig Dispense Refill  . acetaminophen (TYLENOL) 500 MG tablet Tylenol Extra Strength 500 mg tablet  Take 2 tablets twice a day by oral route.    Marland Kitchen amLODipine (NORVASC) 10 MG tablet TAKE 1 TABLET BY MOUTH AT  BEDTIME 90 tablet 1  . amoxicillin (AMOXIL) 500 MG tablet amoxicillin 500 mg tablet  TK 4 TS PO 1 HOUR PRIOR TO DENTAL APPOINTMENT    . ascorbic acid  (VITAMIN C) 1000 MG tablet Vitamin C 1,000 mg tablet  Take 1 tablet every day by oral route.    . fluticasone (FLONASE) 50 MCG/ACT nasal spray Place 1 spray into both nostrils daily. 48 g 1  . losartan (COZAAR) 100 MG tablet TAKE 1 TABLET BY MOUTH  DAILY 90 tablet 1  . naproxen sodium (ALEVE) 220 MG tablet Take 220 mg by mouth.    . Olopatadine HCl (PAZEO) 0.7 % SOLN Pazeo 0.7 % eye drops    . omeprazole (PRILOSEC) 40 MG capsule TAKE ONE CAPSULE BY MOUTH  DAILY 90 capsule 1  . PREVIDENT 5000 BOOSTER PLUS 1.1 % PSTE See admin instructions.    . psyllium (METAMUCIL) 58.6 % powder Take 1 packet by mouth daily.    . tadalafil (CIALIS) 5 MG tablet  5 mg per 1 tablet, ORAL, Daily, 0 Refill(s)    . tamsulosin (FLOMAX) 0.4 MG CAPS capsule Take 0.4 mg by mouth daily.    Marland Kitchen triamcinolone cream (KENALOG) 0.1 % Apply topically 2 (two) times daily.    Marland Kitchen triamcinolone ointment (KENALOG) 0.5 % Apply 1 application topically 2 (two) times daily. 30 g 0  . valACYclovir (VALTREX) 1000 MG tablet Take 1,000 mg by mouth 2 (two) times daily as needed.     No current facility-administered medications on file prior to visit.    Past Medical  History:  Diagnosis Date  . Arthritis    generalized  . BPH (benign prostatic hyperplasia)   . Cancer of skin, squamous cell    frozen   . Cataract    bilateral sx  . GERD (gastroesophageal reflux disease)    on meds  . Hypertension    on meds  . Shingles     Past Surgical History:  Procedure Laterality Date  . CATARACT EXTRACTION, BILATERAL    . COLONOSCOPY  06/27/2020  . COLONOSCOPY  2018  . gum graft    . INGUINAL HERNIA REPAIR     as infant  . LUMBAR LAMINECTOMY  06/2015  . POLYPECTOMY  2018   TA x 3  . REVERSE TOTAL SHOULDER ARTHROPLASTY Bilateral   . TONSILLECTOMY AND ADENOIDECTOMY     age 58   . TOTAL KNEE ARTHROPLASTY Bilateral   . TOTAL KNEE REVISION Right 12/25/2017  . TRANSURETHRAL RESECTION OF PROSTATE     x 2  . UPPER GASTROINTESTINAL  ENDOSCOPY      Social History   Socioeconomic History  . Marital status: Married    Spouse name: Not on file  . Number of children: Not on file  . Years of education: Not on file  . Highest education level: Not on file  Occupational History  . Not on file  Tobacco Use  . Smoking status: Never Smoker  . Smokeless tobacco: Never Used  Vaping Use  . Vaping Use: Never used  Substance and Sexual Activity  . Alcohol use: Yes    Alcohol/week: 2.0 standard drinks    Types: 2 Standard drinks or equivalent per week    Comment: wine 1-2 x a week and not every week   . Drug use: Never  . Sexual activity: Not on file  Other Topics Concern  . Not on file  Social History Narrative  . Not on file   Social Determinants of Health   Financial Resource Strain: Not on file  Food Insecurity: Not on file  Transportation Needs: Not on file  Physical Activity: Not on file  Stress: Not on file  Social Connections: Not on file    Family History  Problem Relation Age of Onset  . Colon polyps Mother 47  . Colon cancer Neg Hx   . Esophageal cancer Neg Hx   . Rectal cancer Neg Hx   . Stomach cancer Neg Hx     Review of Systems     Objective:   Vitals:   10/04/20 1048  BP: 120/76  Pulse: 78  Temp: 98 F (36.7 C)  SpO2: 95%   BP Readings from Last 3 Encounters:  10/04/20 120/76  09/20/20 130/72  06/27/20 135/66   Wt Readings from Last 3 Encounters:  10/04/20 191 lb 12.8 oz (87 kg)  09/20/20 195 lb 6.4 oz (88.6 kg)  06/27/20 194 lb (88 kg)   Body mass index is 28.32 kg/m.   Physical Exam Constitutional:      General: He is not in acute distress.    Appearance: Normal appearance. He is not ill-appearing.  HENT:     Head: Normocephalic and atraumatic.  Musculoskeletal:        General: No tenderness (No tenderness with palpation along lumbar spine or across lower back).  Skin:    General: Skin is warm and dry.     Findings: Rash (She was wrapped slightly improving from  mid back around right side toward right groin.  No blisters.  Area starting to  scab over) present.  Neurological:     Mental Status: He is alert.     Sensory: No sensory deficit.     Motor: No weakness.     Gait: Gait normal.  Psychiatric:        Mood and Affect: Mood normal.            Assessment & Plan:    Lower back pain, neuralgia-postherpetic: New problem Central lower back pain without radiation that is dull in nature likely related to shingles Start gabapentin 100-300 mg at bedtime.  He has taken this in the past and tolerated it well Instructed him to start at 100 mg and increase to 200-300 mg only if needed Discussed that this could cause drowsiness in the morning or in the middle of the night that could increase his risk for falls  Sleep difficulties: He has had sleep difficulties for years, but has gotten worse recently because he has more difficulty falling asleep.  Some of this he blames on his anxiety and some on his prostate He has tried melatonin in the past, but it made him too groggy in the morning Discussed other things he can try over-the-counter Discussed trazodone and he would like to try that-trial 25-50 mg at bedtime.  Advised him to take this only when he is not taking the gabapentin  He will schedule follow-up with Dr. Ronnald Ramp    This visit occurred during the SARS-CoV-2 public health emergency.  Safety protocols were in place, including screening questions prior to the visit, additional usage of staff PPE, and extensive cleaning of exam room while observing appropriate contact time as indicated for disinfecting solutions.

## 2020-10-03 NOTE — Telephone Encounter (Signed)
No note needed 

## 2020-10-04 ENCOUNTER — Encounter: Payer: Self-pay | Admitting: Internal Medicine

## 2020-10-04 ENCOUNTER — Other Ambulatory Visit: Payer: Self-pay

## 2020-10-04 ENCOUNTER — Ambulatory Visit (INDEPENDENT_AMBULATORY_CARE_PROVIDER_SITE_OTHER): Payer: Medicare Other | Admitting: Internal Medicine

## 2020-10-04 VITALS — BP 120/76 | HR 78 | Temp 98.0°F | Ht 69.0 in | Wt 191.8 lb

## 2020-10-04 DIAGNOSIS — G479 Sleep disorder, unspecified: Secondary | ICD-10-CM | POA: Diagnosis not present

## 2020-10-04 DIAGNOSIS — B0229 Other postherpetic nervous system involvement: Secondary | ICD-10-CM | POA: Diagnosis not present

## 2020-10-04 MED ORDER — GABAPENTIN 100 MG PO CAPS: 100.0000 mg | ORAL_CAPSULE | Freq: Every day | ORAL | 0 refills | Status: DC

## 2020-10-04 MED ORDER — TRAZODONE HCL 50 MG PO TABS: 25.0000 mg | ORAL_TABLET | Freq: Every evening | ORAL | 3 refills | Status: DC | PRN

## 2020-10-04 NOTE — Patient Instructions (Addendum)
Your back pain is from the shingles.     Take the gabapentin at night  - take 100-300 mg at night as needed.       You can try Trazodone for your sleep.  Try taking 25-50 mg at night and see if that works.  I sent this to your pharmacy.

## 2020-10-12 ENCOUNTER — Telehealth: Payer: Self-pay | Admitting: Internal Medicine

## 2020-10-12 MED ORDER — TRIAMCINOLONE ACETONIDE 0.5 % EX OINT: 1.0000 "application " | TOPICAL_OINTMENT | Freq: Two times a day (BID) | CUTANEOUS | 1 refills | Status: DC

## 2020-10-12 NOTE — Telephone Encounter (Signed)
Refill sent to pharmacy.   

## 2020-10-12 NOTE — Addendum Note (Signed)
Addended by: Binnie Rail on: 10/12/2020 03:41 PM   Modules accepted: Orders

## 2020-10-12 NOTE — Telephone Encounter (Signed)
Pt has been informed.

## 2020-10-12 NOTE — Telephone Encounter (Signed)
    Patient calling to report shingles are still flared, needing more cream Requesting refill triamcinolone cream (KENALOG) 0.1 %  Pharmacy [facility], [address] AT [address]

## 2020-10-13 MED ORDER — TRIAMCINOLONE ACETONIDE 0.1 % EX CREA: TOPICAL_CREAM | Freq: Two times a day (BID) | CUTANEOUS | 1 refills | Status: DC

## 2020-10-13 NOTE — Addendum Note (Signed)
Addended by: Binnie Rail on: 10/13/2020 12:10 PM   Modules accepted: Orders

## 2020-10-13 NOTE — Telephone Encounter (Signed)
Patient calling, the ointment did not work as well and it was too thick. He is requesting the cream to be sent in

## 2020-10-16 ENCOUNTER — Other Ambulatory Visit: Payer: Self-pay | Admitting: Internal Medicine

## 2020-10-17 ENCOUNTER — Ambulatory Visit: Payer: Medicare Other | Admitting: Internal Medicine

## 2020-10-30 ENCOUNTER — Ambulatory Visit: Payer: Medicare Other | Admitting: Allergy

## 2020-11-13 ENCOUNTER — Other Ambulatory Visit: Payer: Self-pay

## 2020-11-13 ENCOUNTER — Ambulatory Visit: Payer: Medicare Other | Admitting: Internal Medicine

## 2020-11-13 ENCOUNTER — Ambulatory Visit (INDEPENDENT_AMBULATORY_CARE_PROVIDER_SITE_OTHER): Payer: Medicare Other | Admitting: Internal Medicine

## 2020-11-13 ENCOUNTER — Encounter: Payer: Self-pay | Admitting: Internal Medicine

## 2020-11-13 VITALS — BP 134/78 | HR 67 | Temp 97.9°F | Ht 69.0 in | Wt 191.0 lb

## 2020-11-13 DIAGNOSIS — I10 Essential (primary) hypertension: Secondary | ICD-10-CM | POA: Diagnosis not present

## 2020-11-13 DIAGNOSIS — K219 Gastro-esophageal reflux disease without esophagitis: Secondary | ICD-10-CM | POA: Diagnosis not present

## 2020-11-13 DIAGNOSIS — R3912 Poor urinary stream: Secondary | ICD-10-CM

## 2020-11-13 DIAGNOSIS — N320 Bladder-neck obstruction: Secondary | ICD-10-CM

## 2020-11-13 DIAGNOSIS — N401 Enlarged prostate with lower urinary tract symptoms: Secondary | ICD-10-CM | POA: Diagnosis not present

## 2020-11-13 DIAGNOSIS — N400 Enlarged prostate without lower urinary tract symptoms: Secondary | ICD-10-CM | POA: Insufficient documentation

## 2020-11-13 DIAGNOSIS — Z23 Encounter for immunization: Secondary | ICD-10-CM

## 2020-11-13 DIAGNOSIS — B009 Herpesviral infection, unspecified: Secondary | ICD-10-CM

## 2020-11-13 DIAGNOSIS — E785 Hyperlipidemia, unspecified: Secondary | ICD-10-CM

## 2020-11-13 MED ORDER — VALACYCLOVIR HCL 1 G PO TABS: 1000.0000 mg | ORAL_TABLET | Freq: Two times a day (BID) | ORAL | 4 refills | Status: DC

## 2020-11-13 NOTE — Patient Instructions (Signed)

## 2020-11-13 NOTE — Progress Notes (Addendum)
Subjective:  Patient ID: Kenneth Montoya, male    DOB: 02-27-1941  Age: 80 y.o. MRN: [id number]  CC: Rash, Hyperlipidemia, and Hypertension  This visit occurred during the SARS-CoV-2 public health emergency.  Safety protocols were in place, including screening questions prior to the visit, additional usage of staff PPE, and extensive cleaning of exam room while observing appropriate contact time as indicated for disinfecting solutions.    HPI Kenneth Montoya presents for f/up -   He is recovering from a recent episode of shingles.  The pain and rash have resolved.  He complains of fatigue and weight gain but denies chest pain, shortness of breath, dyspnea on exertion, dizziness, or lightheadedness.  He has intermittent fever blisters and requests a prescription for valacyclovir.  Outpatient Medications Prior to Visit  Medication Sig Dispense Refill   acetaminophen (TYLENOL) 500 MG tablet Tylenol Extra Strength 500 mg tablet  Take 2 tablets twice a day by oral route.     amLODipine (NORVASC) 10 MG tablet TAKE 1 TABLET BY MOUTH AT  BEDTIME 90 tablet 1   amoxicillin (AMOXIL) 500 MG tablet amoxicillin 500 mg tablet  TK 4 TS PO 1 HOUR PRIOR TO DENTAL APPOINTMENT     ascorbic acid (VITAMIN C) 1000 MG tablet Vitamin C 1,000 mg tablet  Take 1 tablet every day by oral route.     fluticasone (FLONASE) 50 MCG/ACT nasal spray Place 1 spray into both nostrils daily. 48 g 1   losartan (COZAAR) 100 MG tablet TAKE 1 TABLET BY MOUTH  DAILY 90 tablet 1   Olopatadine HCl (PAZEO) 0.7 % SOLN Pazeo 0.7 % eye drops     omeprazole (PRILOSEC) 40 MG capsule TAKE ONE CAPSULE BY MOUTH  DAILY 90 capsule 1   PREVIDENT 5000 BOOSTER PLUS 1.1 % PSTE See admin instructions.     psyllium (METAMUCIL) 58.6 % powder Take 1 packet by mouth daily.     tadalafil (CIALIS) 5 MG tablet  5 mg per 1 tablet, ORAL, Daily, 0 Refill(s)     tamsulosin (FLOMAX) 0.4 MG CAPS capsule Take 0.4 mg by mouth daily.     traZODone (DESYREL) 50  MG tablet Take 0.5-1 tablets (25-50 mg total) by mouth at bedtime as needed for sleep. 30 tablet 3   gabapentin (NEURONTIN) 100 MG capsule Take 1-3 capsules (100-300 mg total) by mouth at bedtime. 90 capsule 0   naproxen sodium (ALEVE) 220 MG tablet Take 220 mg by mouth.     triamcinolone cream (KENALOG) 0.1 % Apply topically 2 (two) times daily. 30 g 1   valACYclovir (VALTREX) 1000 MG tablet Take 1,000 mg by mouth 2 (two) times daily as needed.     No facility-administered medications prior to visit.    ROS Review of Systems  Constitutional:  Positive for fatigue and unexpected weight change. Negative for diaphoresis.  HENT: Negative.    Eyes: Negative.   Respiratory:  Negative for cough and shortness of breath.   Cardiovascular:  Negative for chest pain, palpitations and leg swelling.  Gastrointestinal:  Negative for abdominal pain, constipation, diarrhea and nausea.  Endocrine: Negative.   Genitourinary: Negative.  Negative for difficulty urinating.  Musculoskeletal: Negative.  Negative for joint swelling and myalgias.  Skin:  Positive for color change and rash.  Neurological: Negative.  Negative for dizziness.  Hematological:  Negative for adenopathy. Does not bruise/bleed easily.  Psychiatric/Behavioral: Negative.     Objective:  BP 134/78 (BP Location: Left Arm, Patient Position: Sitting, Cuff Size:  Large)   Pulse 67   Temp 97.9 F (36.6 C) (Oral)   Ht 5\' 9"  (1.753 m)   Wt 191 lb (86.6 kg)   SpO2 98%   BMI 28.21 kg/m   BP Readings from Last 3 Encounters:  11/13/20 134/78  10/04/20 120/76  09/20/20 130/72    Wt Readings from Last 3 Encounters:  11/13/20 191 lb (86.6 kg)  10/04/20 191 lb 12.8 oz (87 kg)  09/20/20 195 lb 6.4 oz (88.6 kg)    Physical Exam Vitals reviewed.  HENT:     Nose: Nose normal.     Mouth/Throat:     Mouth: Mucous membranes are moist.  Eyes:     Conjunctiva/sclera: Conjunctivae normal.  Cardiovascular:     Rate and Rhythm: Normal rate  and regular rhythm.     Heart sounds: No murmur heard. Pulmonary:     Effort: Pulmonary effort is normal.     Breath sounds: No stridor. No wheezing, rhonchi or rales.  Abdominal:     General: Abdomen is flat.     Palpations: There is no mass.     Tenderness: There is no abdominal tenderness. There is no guarding.  Musculoskeletal:        General: Normal range of motion.     Cervical back: Neck supple.     Right lower leg: No edema.  Lymphadenopathy:     Cervical: No cervical adenopathy.  Skin:    General: Skin is warm and dry.     Findings: Rash present.     Comments: Across his right lower back onto the torso there are scattered areas of purpleish macules consistent with PIPA.  There are no vesicles, pustules, excoriations, or exudates.  Neurological:     General: No focal deficit present.     Mental Status: He is alert.  Psychiatric:        Mood and Affect: Mood normal.        Behavior: Behavior normal.    Lab Results  Component Value Date   CHOL 157 09/01/2019   TRIG 48 09/01/2019   HDL 64 09/01/2019   LDLCALC 83 09/01/2019   ALT 18 09/01/2019   AST 21 09/01/2019   NA 133 (A) 09/01/2019   K 4.6 09/01/2019   CL 101 09/01/2019   CREATININE 1.1 09/01/2019   BUN 24 (A) 09/01/2019   CO2 23 (A) 09/01/2019    VAS US CAROTID  Result Date: 08/26/2019 Carotid Arterial Duplex Study Indications:       Right bruit. Comparison Study:  No prior exam Performing Technologist: Alvia Grove RVT  Examination Guidelines: A complete evaluation includes B-mode imaging, spectral Doppler, color Doppler, and power Doppler as needed of all accessible portions of each vessel. Bilateral testing is considered an integral part of a complete examination. Limited examinations for reoccurring indications may be performed as noted.  Right Carotid Findings: +----------+--------+--------+--------+------------------+--------+           PSV cm/sEDV cm/sStenosisPlaque DescriptionComments  +----------+--------+--------+--------+------------------+--------+ CCA Prox  81      12                                         +----------+--------+--------+--------+------------------+--------+ CCA Mid   94      14                                         +----------+--------+--------+--------+------------------+--------+  CCA Distal63      12                                         +----------+--------+--------+--------+------------------+--------+ ICA Prox  83      19      1-39%   calcific                   +----------+--------+--------+--------+------------------+--------+ ICA Mid   96      22                                         +----------+--------+--------+--------+------------------+--------+ ICA Distal80      20                                         +----------+--------+--------+--------+------------------+--------+ ECA       101     11              heterogenous               +----------+--------+--------+--------+------------------+--------+ +----------+--------+-------+--------+-------------------+           PSV cm/sEDV cmsDescribeArm Pressure (mmHG) +----------+--------+-------+--------+-------------------+ Subclavian114     2                                  +----------+--------+-------+--------+-------------------+ +---------+--------+--+--------+-+---------+ VertebralPSV cm/s37EDV cm/s9Antegrade +---------+--------+--+--------+-+---------+  Left Carotid Findings: +----------+--------+--------+--------+-------------------------+--------+           PSV cm/sEDV cm/sStenosisPlaque Description       Comments +----------+--------+--------+--------+-------------------------+--------+ CCA Prox  108     20                                                +----------+--------+--------+--------+-------------------------+--------+ CCA Mid   96      17                                                 +----------+--------+--------+--------+-------------------------+--------+ CCA Distal74      16              heterogenous and calcific         +----------+--------+--------+--------+-------------------------+--------+ ICA Prox  80      20      1-39%   heterogenous                      +----------+--------+--------+--------+-------------------------+--------+ ICA Mid   68      21                                                +----------+--------+--------+--------+-------------------------+--------+ ICA Distal87      24                                                +----------+--------+--------+--------+-------------------------+--------+  ECA       93      13              heterogenous                      +----------+--------+--------+--------+-------------------------+--------+ +----------+--------+--------+----------------+-------------------+           PSV cm/sEDV cm/sDescribe        Arm Pressure (mmHG) +----------+--------+--------+----------------+-------------------+ Subclavian170     4       Multiphasic, WNL                    +----------+--------+--------+----------------+-------------------+ +---------+--------+--+--------+-+---------+ VertebralPSV cm/s46EDV cm/s8Antegrade +---------+--------+--+--------+-+---------+   Summary: Right Carotid: Velocities in the right ICA are consistent with a 1-39% stenosis. Left Carotid: Velocities in the left ICA are consistent with a 1-39% stenosis. Vertebrals:  Bilateral vertebral arteries demonstrate antegrade flow. Subclavians: Normal flow hemodynamics were seen in bilateral subclavian              arteries. *See table(s) above for measurements and observations.  Electronically signed by Ruta Hinds MD on 08/26/2019 at 5:17:38 PM.    Final     Assessment & Plan:   Kerry was seen today for rash, hyperlipidemia and hypertension.  Diagnoses and all orders for this visit:  Need for vaccination -      Pneumococcal conjugate vaccine 20-valent  Essential hypertension- His blood pressure is adequately well controlled.  I will monitor his electrolytes and renal function. -     CBC with Differential/Platelet; Future -     Basic metabolic panel; Future -     Hepatic function panel; Future  Gastroesophageal reflux disease without esophagitis- His symptoms are well controlled with the PPI. -     CBC with Differential/Platelet; Future  Bladder outflow obstruction -     PSA; Future -     Urinalysis, Routine w reflex microscopic; Future  Benign prostatic hyperplasia with weak urinary stream- I will monitor his PSA. -     PSA; Future  Herpes simplex -     valACYclovir (VALTREX) 1000 MG tablet; Take 1 tablet (1,000 mg total) by mouth 2 (two) times daily.  Dyslipidemia, goal LDL below 130- LDL goal achieved. Doing well on the statin  -     Lipid panel; Future -     TSH; Future -     Hepatic function panel; Future  I have discontinued Gwyndolyn Saxon B. Ericksen "BILL"'s naproxen sodium, gabapentin, and triamcinolone cream. I have also changed his valACYclovir. Additionally, I am having him maintain his amoxicillin, ascorbic acid, acetaminophen, tamsulosin, Pazeo, tadalafil, psyllium, fluticasone, omeprazole, losartan, amLODipine, PreviDent 5000 Booster Plus, and traZODone.  Meds ordered this encounter  Medications   valACYclovir (VALTREX) 1000 MG tablet    Sig: Take 1 tablet (1,000 mg total) by mouth 2 (two) times daily.    Dispense:  14 tablet    Refill:  4     Follow-up: Return in about 6 months (around 05/15/2021).  Scarlette Calico, MD

## 2020-11-21 ENCOUNTER — Ambulatory Visit (INDEPENDENT_AMBULATORY_CARE_PROVIDER_SITE_OTHER): Payer: Medicare Other | Admitting: Internal Medicine

## 2020-11-21 ENCOUNTER — Encounter: Payer: Self-pay | Admitting: Internal Medicine

## 2020-11-21 ENCOUNTER — Other Ambulatory Visit: Payer: Self-pay

## 2020-11-21 VITALS — BP 118/70 | HR 66 | Temp 97.8°F | Resp 16 | Ht 69.0 in | Wt 192.0 lb

## 2020-11-21 DIAGNOSIS — F411 Generalized anxiety disorder: Secondary | ICD-10-CM

## 2020-11-21 DIAGNOSIS — B009 Herpesviral infection, unspecified: Secondary | ICD-10-CM

## 2020-11-21 DIAGNOSIS — F5104 Psychophysiologic insomnia: Secondary | ICD-10-CM

## 2020-11-21 DIAGNOSIS — L508 Other urticaria: Secondary | ICD-10-CM

## 2020-11-21 MED ORDER — METHYLPREDNISOLONE ACETATE 80 MG/ML IJ SUSP
80.0000 mg | Freq: Once | INTRAMUSCULAR | Status: AC
Start: 2020-11-21 — End: 2020-11-21
  Administered 2020-11-21: 80 mg via INTRAMUSCULAR

## 2020-11-21 MED ORDER — HYDROXYZINE HCL 10 MG PO TABS: 10.0000 mg | ORAL_TABLET | ORAL | 1 refills | Status: DC | PRN

## 2020-11-21 MED ORDER — TRAZODONE HCL 100 MG PO TABS: 100.0000 mg | ORAL_TABLET | Freq: Every day | ORAL | 1 refills | Status: DC

## 2020-11-21 MED ORDER — VALACYCLOVIR HCL 1 G PO TABS: 1000.0000 mg | ORAL_TABLET | Freq: Two times a day (BID) | ORAL | 4 refills | Status: DC

## 2020-11-21 NOTE — Patient Instructions (Signed)
Angioedema Angioedema is the sudden swelling of tissue in the body. Angioedema can affect any part of the body, but it most often affects the deeper parts of the skin. It can cause puffiness or swelling in the legs, hands, genitals, face, mouth, lips, and even intestines. Depending on the cause, angioedema may happen just once. However, some people may have repeated bouts of angioedema during their lives. Symptoms may be mild and may occur in conjunction with other allergic symptoms such as hives. Severe angioedema can be life-threatening if it affects the air passages and obstructs breathing. What are the causes? This condition may be caused by: Foods, such as milk, eggs, shellfish, wheat, or nuts. Certain medicines, such as ACE inhibitors, antibiotics, NSAIDs, birth control pills, or dyes used in X-rays. Hereditary angioedema (HAE) is genetic. Episodes can be triggered by: Illness, infection, or emotional or physical stress. Changes in hormone levels. Exercise. Minor surgical or dental procedures. In some cases, the cause of this condition is not known. What increases the risk? You are more likely to develop HAE if you have family members with this condition.  What are the signs or symptoms? Symptoms of this condition depend on where the swelling happens.  Symptoms of this condition include: Swollen skin. Red, itchy patches of skin (hives). Pain, pressure, or tenderness in the affected area. Swollen eyelids, face, lips, or tongue. Wheezing. Difficulty drinking, swallowing, or closing the mouth completely. Hoarseness or sore throat. Difficulty breathing. If your internal organs are affected, symptoms may also include: Nausea. Pain in the abdomen. Vomiting or diarrhea. Difficulty swallowing. Difficulty passing urine. How is this diagnosed? This condition may be diagnosed based on: An exam of the affected area. Your medical history. Whether anyone in your family has had this  condition before. A review of any medicines you have been taking. Tests, including: Allergy skin tests to see if the condition was caused by an allergic reaction. Blood tests to see if the condition was caused by certain inherited or genetic diseases. How is this treated? Treatment for this condition depends on the cause and severity of your symptoms. It may involve any of the following: Avoiding triggers, if they are known (this may include foods or environmental allergens). Stopping medicines permanently if they cause the condition. These include ACE inhibitors. Taking medicines to treat symptoms or prevent future episodes. These may include: Antihistamines. Epinephrine injections. Steroids. Blood products to treat specific types of non-allergic angioedema. Breathing tubes or ventilators in severe cases in which breathing is affected. Severe cases of angioedema are treated at the hospital. Mild to moderate angioedema usually gets better in 24-48 hours. Follow these instructions at home:  Take over-the-counter and prescription medicines only as told by your health care provider. If you were given medicines for emergency allergy treatment, always carry them with you. This includes epinephrine injector kits. Wear a medical bracelet as told by your health care provider. If something triggers your condition, avoid the trigger. Triggers can be foods, environmental allergens, stress, or exercise. Avoid all medicines that caused your angioedema. This is for your entire life. If your condition is inherited and you are thinking about having children, talk to your health care provider. It is important to discuss the risks of passing on the condition to your children. Where to find more information [employer]: www.aaaai.org Contact a health care provider if: You continue to have repeated episodes of angioedema. Episodes of angioedema start to happen more often  than they used   to, even after you take steps to prevent them. You have episodes of angioedema that are more severe than they have been before, even after you take steps to prevent them. You are thinking about having children. Get help right away if: You have severe swelling of your mouth, tongue, or lips. Your swelling is worsening. You have trouble breathing, swallowing, or talking. You have chest pain, dizziness or light-headedness, or you pass out. These symptoms may represent a serious problem that is an emergency. Do not wait to see if the symptoms will go away. Get medical help right away. Call your local emergency services (911 in the [place] Do not drive yourself to the hospital. Summary Angioedema is the sudden swelling of tissues, and it often affects the deeper parts of the skin. It is important to be aware of all triggers or causes for your angioedema and to avoid them. Make sure you have a plan in place in case you develop angioedema. Treatment for this condition depends on the cause and severity of your symptoms. Severe angioedema can be life-threatening if it obstructs the air passages. It needs to be treated right away in the hospital. This information is not intended to replace advice given to you by your health care provider. Make sure you discuss any questions you have with your health care provider. Document Revised: 04/20/2019 Document Reviewed: 04/20/2019 [employer] Patient Education  2022 [employer]  

## 2020-11-21 NOTE — Progress Notes (Signed)
Subjective:  Patient ID: Kenneth Montoya, male    DOB: 08-May-1941  Age: 80 y.o. MRN: [id number]  CC: Rash  This visit occurred during the SARS-CoV-2 public health emergency.  Safety protocols were in place, including screening questions prior to the visit, additional usage of staff PPE, and extensive cleaning of exam room while observing appropriate contact time as indicated for disinfecting solutions.    HPI Kenneth Montoya presents for f/up -   He was recently seen for painful rash over his right lower back.  He was successfully treated with valacyclovir.  That rash has resolved.  Today he woke up with a rash in the same area.  It is intensely pruritic but not painful.  He took photos of the and the photos demonstrate urticarial wheals.  He has gotten some symptom relief by applying calamine lotion.  Outpatient Medications Prior to Visit  Medication Sig Dispense Refill   acetaminophen (TYLENOL) 500 MG tablet Tylenol Extra Strength 500 mg tablet  Take 2 tablets twice a day by oral route.     amLODipine (NORVASC) 10 MG tablet TAKE 1 TABLET BY MOUTH AT  BEDTIME 90 tablet 1   amoxicillin (AMOXIL) 500 MG tablet amoxicillin 500 mg tablet  TK 4 TS PO 1 HOUR PRIOR TO DENTAL APPOINTMENT     ascorbic acid (VITAMIN C) 1000 MG tablet Vitamin C 1,000 mg tablet  Take 1 tablet every day by oral route.     fluticasone (FLONASE) 50 MCG/ACT nasal spray Place 1 spray into both nostrils daily. 48 g 1   losartan (COZAAR) 100 MG tablet TAKE 1 TABLET BY MOUTH  DAILY 90 tablet 1   Olopatadine HCl (PAZEO) 0.7 % SOLN Pazeo 0.7 % eye drops     omeprazole (PRILOSEC) 40 MG capsule TAKE ONE CAPSULE BY MOUTH  DAILY 90 capsule 1   PREVIDENT 5000 BOOSTER PLUS 1.1 % PSTE See admin instructions.     psyllium (METAMUCIL) 58.6 % powder Take 1 packet by mouth daily.     tadalafil (CIALIS) 5 MG tablet  5 mg per 1 tablet, ORAL, Daily, 0 Refill(s)     tamsulosin (FLOMAX) 0.4 MG CAPS capsule Take 0.4 mg by mouth daily.      traZODone (DESYREL) 50 MG tablet Take 0.5-1 tablets (25-50 mg total) by mouth at bedtime as needed for sleep. 30 tablet 3   valACYclovir (VALTREX) 1000 MG tablet Take 1 tablet (1,000 mg total) by mouth 2 (two) times daily. 14 tablet 4   No facility-administered medications prior to visit.    ROS Review of Systems  Constitutional:  Negative for chills, fatigue and fever.  HENT: Negative.    Eyes: Negative.   Respiratory:  Negative for cough, chest tightness, shortness of breath and wheezing.   Cardiovascular:  Negative for chest pain, palpitations and leg swelling.  Gastrointestinal:  Negative for abdominal pain.  Endocrine: Negative.   Genitourinary: Negative.  Negative for difficulty urinating.  Musculoskeletal: Negative.  Negative for back pain.  Skin:  Negative for color change and rash.  Neurological: Negative.  Negative for dizziness.  Hematological:  Negative for adenopathy. Does not bruise/bleed easily.  Psychiatric/Behavioral:  Positive for sleep disturbance. Negative for decreased concentration and suicidal ideas. The patient is nervous/anxious.    Objective:  BP 118/70 (BP Location: Left Arm, Patient Position: Sitting, Cuff Size: Large)   Pulse 66   Temp 97.8 F (36.6 C) (Oral)   Resp 16   Ht 5\' 9"  (1.753 m)   Wt  192 lb (87.1 kg)   SpO2 97%   BMI 28.35 kg/m   BP Readings from Last 3 Encounters:  11/21/20 118/70  11/13/20 134/78  10/04/20 120/76    Wt Readings from Last 3 Encounters:  11/21/20 192 lb (87.1 kg)  11/13/20 191 lb (86.6 kg)  10/04/20 191 lb 12.8 oz (87 kg)    Physical Exam Vitals reviewed.  Constitutional:      Appearance: He is not ill-appearing.  HENT:     Mouth/Throat:     Mouth: Mucous membranes are moist.  Eyes:     Conjunctiva/sclera: Conjunctivae normal.  Cardiovascular:     Rate and Rhythm: Normal rate and regular rhythm.  Pulmonary:     Effort: Pulmonary effort is normal.     Breath sounds: Normal breath sounds.  Abdominal:      General: Abdomen is flat.     Palpations: There is no mass.     Tenderness: There is no abdominal tenderness.  Musculoskeletal:        General: Normal range of motion.     Cervical back: Neck supple.  Skin:    Findings: Rash present. Rash is urticarial. Rash is not crusting, macular, nodular, papular, purpuric, pustular, scaling or vesicular.     Comments: Over his right lower back there are urticarial wheals covered with calamine lotion.  There are no vesicles, pustules, excoriations, or target lesions. See photos.  Neurological:     General: No focal deficit present.     Mental Status: He is alert.    Lab Results  Component Value Date   CHOL 157 09/01/2019   TRIG 48 09/01/2019   HDL 64 09/01/2019   LDLCALC 83 09/01/2019   ALT 18 09/01/2019   AST 21 09/01/2019   NA 133 (A) 09/01/2019   K 4.6 09/01/2019   CL 101 09/01/2019   CREATININE 1.1 09/01/2019   BUN 24 (A) 09/01/2019   CO2 23 (A) 09/01/2019    VAS US CAROTID  Result Date: 08/26/2019 Carotid Arterial Duplex Study Indications:       Right bruit. Comparison Study:  No prior exam Performing Technologist: Alvia Grove RVT  Examination Guidelines: A complete evaluation includes B-mode imaging, spectral Doppler, color Doppler, and power Doppler as needed of all accessible portions of each vessel. Bilateral testing is considered an integral part of a complete examination. Limited examinations for reoccurring indications may be performed as noted.  Right Carotid Findings: +----------+--------+--------+--------+------------------+--------+           PSV cm/sEDV cm/sStenosisPlaque DescriptionComments +----------+--------+--------+--------+------------------+--------+ CCA Prox  81      12                                         +----------+--------+--------+--------+------------------+--------+ CCA Mid   94      14                                          +----------+--------+--------+--------+------------------+--------+ CCA Distal63      12                                         +----------+--------+--------+--------+------------------+--------+ ICA Prox  83      19  1-39%   calcific                   +----------+--------+--------+--------+------------------+--------+ ICA Mid   96      22                                         +----------+--------+--------+--------+------------------+--------+ ICA Distal80      20                                         +----------+--------+--------+--------+------------------+--------+ ECA       101     11              heterogenous               +----------+--------+--------+--------+------------------+--------+ +----------+--------+-------+--------+-------------------+           PSV cm/sEDV cmsDescribeArm Pressure (mmHG) +----------+--------+-------+--------+-------------------+ Subclavian114     2                                  +----------+--------+-------+--------+-------------------+ +---------+--------+--+--------+-+---------+ VertebralPSV cm/s37EDV cm/s9Antegrade +---------+--------+--+--------+-+---------+  Left Carotid Findings: +----------+--------+--------+--------+-------------------------+--------+           PSV cm/sEDV cm/sStenosisPlaque Description       Comments +----------+--------+--------+--------+-------------------------+--------+ CCA Prox  108     20                                                +----------+--------+--------+--------+-------------------------+--------+ CCA Mid   96      17                                                +----------+--------+--------+--------+-------------------------+--------+ CCA Distal74      16              heterogenous and calcific         +----------+--------+--------+--------+-------------------------+--------+ ICA Prox  80      20      1-39%   heterogenous                       +----------+--------+--------+--------+-------------------------+--------+ ICA Mid   68      21                                                +----------+--------+--------+--------+-------------------------+--------+ ICA Distal87      24                                                +----------+--------+--------+--------+-------------------------+--------+ ECA       93      13              heterogenous                      +----------+--------+--------+--------+-------------------------+--------+ +----------+--------+--------+----------------+-------------------+  PSV cm/sEDV cm/sDescribe        Arm Pressure (mmHG) +----------+--------+--------+----------------+-------------------+ Subclavian170     4       Multiphasic, WNL                    +----------+--------+--------+----------------+-------------------+ +---------+--------+--+--------+-+---------+ VertebralPSV cm/s46EDV cm/s8Antegrade +---------+--------+--+--------+-+---------+   Summary: Right Carotid: Velocities in the right ICA are consistent with a 1-39% stenosis. Left Carotid: Velocities in the left ICA are consistent with a 1-39% stenosis. Vertebrals:  Bilateral vertebral arteries demonstrate antegrade flow. Subclavians: Normal flow hemodynamics were seen in bilateral subclavian              arteries. *See table(s) above for measurements and observations.  Electronically signed by Ruta Hinds MD on 08/26/2019 at 5:17:38 PM.    Final     Assessment & Plan:   Kenneth Montoya was seen today for rash.  Diagnoses and all orders for this visit:  Urticaria, acute- Will treat with systemic steroids and antihistamines. -     hydrOXYzine (ATARAX/VISTARIL) 10 MG tablet; Take 1 tablet (10 mg total) by mouth every 4 (four) hours as needed. -     methylPREDNISolone acetate (DEPO-MEDROL) injection 80 mg  Herpes simplex -     valACYclovir (VALTREX) 1000 MG tablet; Take 1 tablet (1,000 mg total) by mouth 2  (two) times daily.  Psychophysiological insomnia -     traZODone (DESYREL) 100 MG tablet; Take 1 tablet (100 mg total) by mouth at bedtime.  GAD (generalized anxiety disorder) -     traZODone (DESYREL) 100 MG tablet; Take 1 tablet (100 mg total) by mouth at bedtime.  I have discontinued Kenneth Saxon B. Saia "BILL"'s traZODone. I am also having him start on hydrOXYzine and traZODone. Additionally, I am having him maintain his amoxicillin, ascorbic acid, acetaminophen, tamsulosin, Pazeo, tadalafil, psyllium, fluticasone, omeprazole, losartan, amLODipine, PreviDent 5000 Booster Plus, and valACYclovir. We administered methylPREDNISolone acetate.  Meds ordered this encounter  Medications   valACYclovir (VALTREX) 1000 MG tablet    Sig: Take 1 tablet (1,000 mg total) by mouth 2 (two) times daily.    Dispense:  14 tablet    Refill:  4   hydrOXYzine (ATARAX/VISTARIL) 10 MG tablet    Sig: Take 1 tablet (10 mg total) by mouth every 4 (four) hours as needed.    Dispense:  65 tablet    Refill:  1   traZODone (DESYREL) 100 MG tablet    Sig: Take 1 tablet (100 mg total) by mouth at bedtime.    Dispense:  90 tablet    Refill:  1   methylPREDNISolone acetate (DEPO-MEDROL) injection 80 mg     Follow-up: Return if symptoms worsen or fail to improve.  Scarlette Calico, MD

## 2020-11-30 ENCOUNTER — Other Ambulatory Visit: Payer: Self-pay | Admitting: Internal Medicine

## 2020-11-30 ENCOUNTER — Telehealth: Payer: Self-pay | Admitting: Internal Medicine

## 2020-11-30 DIAGNOSIS — F5104 Psychophysiologic insomnia: Secondary | ICD-10-CM

## 2020-11-30 DIAGNOSIS — F411 Generalized anxiety disorder: Secondary | ICD-10-CM

## 2020-11-30 MED ORDER — TRAZODONE HCL 50 MG PO TABS: 25.0000 mg | ORAL_TABLET | Freq: Every evening | ORAL | 1 refills | Status: DC | PRN

## 2020-11-30 NOTE — Telephone Encounter (Signed)
   Patient calling to request lower dose of Trazodone. He states he feels better taking half the pill. He states the 100mg  causes him to feel too tired the next day. He is requesting 25 or 50mg    Please advise

## 2020-12-09 ENCOUNTER — Emergency Department (HOSPITAL_COMMUNITY): Payer: Medicare Other

## 2020-12-09 ENCOUNTER — Other Ambulatory Visit: Payer: Self-pay

## 2020-12-09 ENCOUNTER — Observation Stay (HOSPITAL_COMMUNITY)
Admission: EM | Admit: 2020-12-09 | Discharge: 2020-12-10 | Disposition: A | Discharge status: Home / Self Care | Payer: Medicare Other | Attending: Internal Medicine | Admitting: Internal Medicine

## 2020-12-09 ENCOUNTER — Observation Stay (HOSPITAL_COMMUNITY): Payer: Medicare Other

## 2020-12-09 DIAGNOSIS — I63531 Cerebral infarction due to unspecified occlusion or stenosis of right posterior cerebral artery: Principal | ICD-10-CM | POA: Insufficient documentation

## 2020-12-09 DIAGNOSIS — U071 COVID-19: Secondary | ICD-10-CM

## 2020-12-09 DIAGNOSIS — Z96612 Presence of left artificial shoulder joint: Secondary | ICD-10-CM | POA: Diagnosis not present

## 2020-12-09 DIAGNOSIS — Z96653 Presence of artificial knee joint, bilateral: Secondary | ICD-10-CM | POA: Insufficient documentation

## 2020-12-09 DIAGNOSIS — Z85828 Personal history of other malignant neoplasm of skin: Secondary | ICD-10-CM | POA: Diagnosis not present

## 2020-12-09 DIAGNOSIS — Z96611 Presence of right artificial shoulder joint: Secondary | ICD-10-CM | POA: Diagnosis not present

## 2020-12-09 DIAGNOSIS — Z79899 Other long term (current) drug therapy: Secondary | ICD-10-CM | POA: Diagnosis not present

## 2020-12-09 DIAGNOSIS — H5340 Unspecified visual field defects: Secondary | ICD-10-CM | POA: Diagnosis not present

## 2020-12-09 DIAGNOSIS — I639 Cerebral infarction, unspecified: Secondary | ICD-10-CM

## 2020-12-09 DIAGNOSIS — E871 Hypo-osmolality and hyponatremia: Secondary | ICD-10-CM | POA: Diagnosis not present

## 2020-12-09 DIAGNOSIS — H539 Unspecified visual disturbance: Secondary | ICD-10-CM | POA: Diagnosis present

## 2020-12-09 DIAGNOSIS — I10 Essential (primary) hypertension: Secondary | ICD-10-CM | POA: Insufficient documentation

## 2020-12-09 DIAGNOSIS — F5104 Psychophysiologic insomnia: Secondary | ICD-10-CM

## 2020-12-09 DIAGNOSIS — I4510 Unspecified right bundle-branch block: Secondary | ICD-10-CM | POA: Diagnosis not present

## 2020-12-09 LAB — COMPREHENSIVE METABOLIC PANEL
ALT: 18 U/L (ref 0–44)
AST: 23 U/L (ref 15–41)
Albumin: 4.5 g/dL (ref 3.5–5.0)
Alkaline Phosphatase: 79 U/L (ref 38–126)
Anion gap: 8 (ref 5–15)
BUN: 18 mg/dL (ref 8–23)
CO2: 25 mmol/L (ref 22–32)
Calcium: 9.2 mg/dL (ref 8.9–10.3)
Chloride: 97 mmol/L — ABNORMAL LOW (ref 98–111)
Creatinine, Ser: 1 mg/dL (ref 0.61–1.24)
GFR, Estimated: >60 mL/min (ref >60)
Glucose, Bld: 75 mg/dL (ref 70–99)
Potassium: 4.5 mmol/L (ref 3.5–5.1)
Sodium: 130 mmol/L — ABNORMAL LOW (ref 135–145)
Total Bilirubin: 0.8 mg/dL (ref 0.3–1.2)
Total Protein: 7.3 g/dL (ref 6.5–8.1)

## 2020-12-09 LAB — D-DIMER, QUANTITATIVE: D-Dimer, Quant: 0.51 ug/mL-FEU — ABNORMAL HIGH (ref 0.00–0.50)

## 2020-12-09 LAB — I-STAT CHEM 8, ED
BUN: 20 mg/dL (ref 8–23)
Calcium, Ion: 1.11 mmol/L — ABNORMAL LOW (ref 1.15–1.40)
Chloride: 97 mmol/L — ABNORMAL LOW (ref 98–111)
Creatinine, Ser: 1 mg/dL (ref 0.61–1.24)
Glucose, Bld: 73 mg/dL (ref 70–99)
HCT: 42 % (ref 39.0–52.0)
Hemoglobin: 14.3 g/dL (ref 13.0–17.0)
Potassium: 4.5 mmol/L (ref 3.5–5.1)
Sodium: 130 mmol/L — ABNORMAL LOW (ref 135–145)
TCO2: 25 mmol/L (ref 22–32)

## 2020-12-09 LAB — DIFFERENTIAL
Abs Immature Granulocytes: 0.05 10*3/uL (ref 0.00–0.07)
Basophils Absolute: 0.1 10*3/uL (ref 0.0–0.1)
Basophils Relative: 1 %
Eosinophils Absolute: 0.2 10*3/uL (ref 0.0–0.5)
Eosinophils Relative: 3 %
Immature Granulocytes: 1 %
Lymphocytes Relative: 18 %
Lymphs Abs: 1.5 10*3/uL (ref 0.7–4.0)
Monocytes Absolute: 0.7 10*3/uL (ref 0.1–1.0)
Monocytes Relative: 8 %
Neutro Abs: 5.7 10*3/uL (ref 1.7–7.7)
Neutrophils Relative %: 69 %

## 2020-12-09 LAB — CBC
HCT: 39.4 % (ref 39.0–52.0)
Hemoglobin: 13.6 g/dL (ref 13.0–17.0)
MCH: 32 pg (ref 26.0–34.0)
MCHC: 34.5 g/dL (ref 30.0–36.0)
MCV: 92.7 fL (ref 80.0–100.0)
Platelets: 213 10*3/uL (ref 150–400)
RBC: 4.25 MIL/uL (ref 4.22–5.81)
RDW: 12.7 % (ref 11.5–15.5)
WBC: 8.1 10*3/uL (ref 4.0–10.5)
nRBC: 0 % (ref 0.0–0.2)

## 2020-12-09 LAB — ABO/RH: ABO/RH(D): O POS

## 2020-12-09 LAB — APTT: aPTT: 31 seconds (ref 24–36)

## 2020-12-09 LAB — FIBRINOGEN: Fibrinogen: 382 mg/dL (ref 210–475)

## 2020-12-09 LAB — LACTATE DEHYDROGENASE: LDH: 197 U/L — ABNORMAL HIGH (ref 98–192)

## 2020-12-09 LAB — TYPE AND SCREEN
ABO/RH(D): O POS
Antibody Screen: NEGATIVE

## 2020-12-09 LAB — RESP PANEL BY RT-PCR (FLU A&B, COVID) ARPGX2
Influenza A by PCR: NEGATIVE
Influenza B by PCR: NEGATIVE
SARS Coronavirus 2 by RT PCR: POSITIVE — AB

## 2020-12-09 LAB — PROTIME-INR
INR: 1 (ref 0.8–1.2)
Prothrombin Time: 13.1 seconds (ref 11.4–15.2)

## 2020-12-09 LAB — TROPONIN I (HIGH SENSITIVITY): Troponin I (High Sensitivity): 15 ng/L (ref <18)

## 2020-12-09 LAB — CBG MONITORING, ED: Glucose-Capillary: 91 mg/dL (ref 70–99)

## 2020-12-09 LAB — FERRITIN: Ferritin: 38 ng/mL (ref 24–336)

## 2020-12-09 LAB — HEPATITIS B SURFACE ANTIGEN: Hepatitis B Surface Ag: NONREACTIVE

## 2020-12-09 LAB — C-REACTIVE PROTEIN: CRP: 0.5 mg/dL (ref <1.0)

## 2020-12-09 LAB — BRAIN NATRIURETIC PEPTIDE: B Natriuretic Peptide: 45.9 pg/mL (ref 0.0–100.0)

## 2020-12-09 MED ORDER — ACETAMINOPHEN 500 MG PO TABS
1000.0000 mg | ORAL_TABLET | Freq: Three times a day (TID) | ORAL | Status: DC | PRN
  Administered 2020-12-09 – 2020-12-10 (×2): 1000 mg via ORAL
  Filled 2020-12-09 (×3): qty 2

## 2020-12-09 MED ORDER — OLOPATADINE HCL 0.1 % OP SOLN
1.0000 [drp] | Freq: Every day | OPHTHALMIC | Status: DC
  Administered 2020-12-09: 1 [drp] via OPHTHALMIC
  Filled 2020-12-09: qty 5

## 2020-12-09 MED ORDER — PANTOPRAZOLE SODIUM 40 MG PO TBEC
40.0000 mg | DELAYED_RELEASE_TABLET | Freq: Every day | ORAL | Status: DC
  Filled 2020-12-09: qty 1

## 2020-12-09 MED ORDER — STROKE: EARLY STAGES OF RECOVERY BOOK
Freq: Once | Status: AC
  Filled 2020-12-09: qty 1

## 2020-12-09 MED ORDER — ASPIRIN EC 81 MG PO TBEC
81.0000 mg | DELAYED_RELEASE_TABLET | Freq: Every day | ORAL | Status: DC
  Administered 2020-12-10: 81 mg via ORAL
  Filled 2020-12-09: qty 1

## 2020-12-09 MED ORDER — NAPROXEN 250 MG PO TABS
250.0000 mg | ORAL_TABLET | Freq: Two times a day (BID) | ORAL | Status: DC | PRN
  Filled 2020-12-09: qty 1

## 2020-12-09 MED ORDER — FLUTICASONE PROPIONATE 50 MCG/ACT NA SUSP
1.0000 | Freq: Every day | NASAL | Status: DC
  Filled 2020-12-09: qty 16

## 2020-12-09 MED ORDER — TAMSULOSIN HCL 0.4 MG PO CAPS
0.4000 mg | ORAL_CAPSULE | Freq: Every day | ORAL | Status: DC
  Administered 2020-12-09: 0.4 mg via ORAL
  Filled 2020-12-09: qty 1

## 2020-12-09 MED ORDER — SODIUM CHLORIDE 0.9% FLUSH: 3.0000 mL | Freq: Once | INTRAVENOUS | Status: DC

## 2020-12-09 MED ORDER — NAPROXEN SODIUM 220 MG PO TABS: 440.0000 mg | ORAL_TABLET | Freq: Two times a day (BID) | ORAL | Status: DC | PRN

## 2020-12-09 MED ORDER — SENNOSIDES-DOCUSATE SODIUM 8.6-50 MG PO TABS: 1.0000 | ORAL_TABLET | Freq: Every evening | ORAL | Status: DC | PRN

## 2020-12-09 MED ORDER — AMLODIPINE BESYLATE 5 MG PO TABS: 10.0000 mg | ORAL_TABLET | Freq: Every day | ORAL | Status: DC

## 2020-12-09 MED ORDER — ENOXAPARIN SODIUM 40 MG/0.4ML IJ SOSY
40.0000 mg | PREFILLED_SYRINGE | INTRAMUSCULAR | Status: DC
  Administered 2020-12-09: 40 mg via SUBCUTANEOUS
  Filled 2020-12-09: qty 0.4

## 2020-12-09 MED ORDER — LOSARTAN POTASSIUM 50 MG PO TABS: 100.0000 mg | ORAL_TABLET | Freq: Every day | ORAL | Status: DC

## 2020-12-09 MED ORDER — DIAZEPAM 5 MG/ML IJ SOLN
5.0000 mg | Freq: Once | INTRAMUSCULAR | Status: AC
  Administered 2020-12-09: 5 mg via INTRAVENOUS
  Filled 2020-12-09: qty 2

## 2020-12-09 MED ORDER — LORAZEPAM 2 MG/ML IJ SOLN
1.0000 mg | Freq: Once | INTRAMUSCULAR | Status: AC
  Administered 2020-12-09: 1 mg via INTRAVENOUS
  Filled 2020-12-09: qty 1

## 2020-12-09 MED ORDER — HYDRALAZINE HCL 25 MG PO TABS
25.0000 mg | ORAL_TABLET | Freq: Four times a day (QID) | ORAL | Status: DC | PRN
  Administered 2020-12-09: 25 mg via ORAL
  Filled 2020-12-09: qty 1

## 2020-12-09 MED ORDER — LORAZEPAM 0.5 MG PO TABS: 0.5000 mg | ORAL_TABLET | ORAL | Status: DC | PRN

## 2020-12-09 MED ORDER — PSYLLIUM 95 % PO PACK
1.0000 | PACK | Freq: Every day | ORAL | Status: DC
  Administered 2020-12-09: 1 via ORAL
  Filled 2020-12-09 (×2): qty 1

## 2020-12-09 NOTE — ED Triage Notes (Signed)
Patient here from home. Complaint of dark clouded vision in left eye that began 330 this morning. Pt initially spoke with doctor, wanted to been seen for stroke.

## 2020-12-09 NOTE — ED Provider Notes (Signed)
[facility]PART[facility] Provider Note   CSN: [id number] Arrival date & time: 12/09/20  1153     History Chief Complaint  Patient presents with   Code Stroke    Kenneth Montoya is a 80 y.o. male.  He is here for possible strokelike symptoms.  Said he went to bed around 11 PM without any symptoms.  He woke up to urinate around 3 AM and noticed he had cloudy vision on the left side.  No eye pain.  No numbness or weakness or balance problems.  No double vision.  Symptoms did not improve and so presented here for evaluation.  The history is provided by the patient.  Cerebrovascular Accident This is a new problem. The current episode started 12 to 24 hours ago. The problem occurs constantly. The problem has not changed since onset.Pertinent negatives include no chest pain, no abdominal pain, no headaches and no shortness of breath. Nothing aggravates the symptoms. Nothing relieves the symptoms. He has tried nothing for the symptoms. The treatment provided no relief.      Past Medical History:  Diagnosis Date   Arthritis    generalized   BPH (benign prostatic hyperplasia)    Cancer of skin, squamous cell    frozen    Cataract    bilateral sx   GERD (gastroesophageal reflux disease)    on meds   Hypertension    on meds   Shingles     Patient Active Problem List   Diagnosis Date Noted   Psychophysiological insomnia 11/21/2020   GAD (generalized anxiety disorder) 11/21/2020   BPH (benign prostatic hyperplasia)    Essential hypertension 11/08/2019   Gastroesophageal reflux disease without esophagitis 11/08/2019   Herpes simplex 11/08/2019   Primary erectile dysfunction 11/08/2019   Senile purpura ([place]) 05/13/2019   Hardening of the aorta (main artery of the heart) ([place]) 01/19/2019   Chronic allergic rhinitis due to pollen 03/24/2017   LPRD (laryngopharyngeal reflux disease) 03/24/2017   Bladder outflow obstruction 03/04/2016    Past Surgical  History:  Procedure Laterality Date   CATARACT EXTRACTION, BILATERAL     COLONOSCOPY  06/27/2020   COLONOSCOPY  2018   gum graft     INGUINAL HERNIA REPAIR     as infant   LUMBAR LAMINECTOMY  06/2015   POLYPECTOMY  2018   TA x 3   REVERSE TOTAL SHOULDER ARTHROPLASTY Bilateral    TONSILLECTOMY AND ADENOIDECTOMY     age 75    TOTAL KNEE ARTHROPLASTY Bilateral    TOTAL KNEE REVISION Right 12/25/2017   TRANSURETHRAL RESECTION OF PROSTATE     x 2   UPPER GASTROINTESTINAL ENDOSCOPY         Family History  Problem Relation Age of Onset   Colon polyps Mother 64   Colon cancer Neg Hx    Esophageal cancer Neg Hx    Rectal cancer Neg Hx    Stomach cancer Neg Hx     Social History   Tobacco Use   Smoking status: Never   Smokeless tobacco: Never  Vaping Use   Vaping Use: Never used  Substance Use Topics   Alcohol use: Yes    Alcohol/week: 2.0 standard drinks    Types: 2 Standard drinks or equivalent per week    Comment: wine 1-2 x a week and not every week    Drug use: Never    Home Medications Prior to Admission medications   Medication Sig Start Date End Date Taking?  Authorizing Provider  acetaminophen (TYLENOL) 500 MG tablet Tylenol Extra Strength 500 mg tablet  Take 2 tablets twice a day by oral route.    [provider]  amLODipine (NORVASC) 10 MG tablet TAKE 1 TABLET BY MOUTH AT  BEDTIME 09/09/20   Janith Lima, MD  amoxicillin (AMOXIL) 500 MG tablet amoxicillin 500 mg tablet  TK 4 TS PO 1 HOUR PRIOR TO DENTAL APPOINTMENT    [provider]  ascorbic acid (VITAMIN C) 1000 MG tablet Vitamin C 1,000 mg tablet  Take 1 tablet every day by oral route.    [provider]  fluticasone (FLONASE) 50 MCG/ACT nasal spray Place 1 spray into both nostrils daily. 04/03/20   Janith Lima, MD  hydrOXYzine (ATARAX/VISTARIL) 10 MG tablet Take 1 tablet (10 mg total) by mouth every 4 (four) hours as needed. 11/21/20   Janith Lima, MD  losartan (COZAAR)  100 MG tablet TAKE 1 TABLET BY MOUTH  DAILY 08/10/20   Janith Lima, MD  Olopatadine HCl (PAZEO) 0.7 % SOLN Pazeo 0.7 % eye drops 06/21/15   [provider]  omeprazole (PRILOSEC) 40 MG capsule TAKE ONE CAPSULE BY MOUTH  DAILY 08/03/20   Janith Lima, MD  PREVIDENT 5000 BOOSTER PLUS 1.1 % PSTE See admin instructions. 09/28/20   [provider]  psyllium (METAMUCIL) 58.6 % powder Take 1 packet by mouth daily.    [provider]  tadalafil (CIALIS) 5 MG tablet  5 mg per 1 tablet, ORAL, Daily, 0 Refill(s) 06/06/15   [provider]  tamsulosin (FLOMAX) 0.4 MG CAPS capsule Take 0.4 mg by mouth daily. 09/17/19   [provider]  traZODone (DESYREL) 50 MG tablet Take 0.5 tablets (25 mg total) by mouth at bedtime as needed for sleep. 11/30/20   Janith Lima, MD  valACYclovir (VALTREX) 1000 MG tablet Take 1 tablet (1,000 mg total) by mouth 2 (two) times daily. 11/21/20   Janith Lima, MD    Allergies    Patient has no known allergies.  Review of Systems   Review of Systems  Constitutional:  Negative for fever.  HENT:  Negative for sore throat.   Eyes:  Positive for visual disturbance.  Respiratory:  Negative for shortness of breath.   Cardiovascular:  Negative for chest pain.  Gastrointestinal:  Negative for abdominal pain.  Genitourinary:  Negative for dysuria.  Musculoskeletal:  Negative for neck pain.  Skin:  Negative for rash.  Neurological:  Negative for headaches.   Physical Exam Updated Vital Signs BP 138/82   Pulse 71   Temp 97.7 F (36.5 C) (Oral)   Resp 14   SpO2 97%   Physical Exam Vitals and nursing note reviewed.  Constitutional:      Appearance: He is well-developed.  HENT:     Head: Normocephalic and atraumatic.  Eyes:     General: Visual field deficit present.     Conjunctiva/sclera: Conjunctivae normal.  Cardiovascular:     Rate and Rhythm: Normal rate and regular rhythm.     Heart sounds: No murmur  heard. Pulmonary:     Effort: Pulmonary effort is normal. No respiratory distress.     Breath sounds: Normal breath sounds.  Abdominal:     Palpations: Abdomen is soft.     Tenderness: There is no abdominal tenderness.  Musculoskeletal:     Cervical back: Neck supple.  Skin:    General: Skin is warm and dry.  Neurological:  Mental Status: He is alert.     Sensory: Sensation is intact.     Motor: Motor function is intact.     Coordination: Coordination is intact.     Comments: Patient has decreased vision in his left lateral vision in both eyes.    ED Results / Procedures / Treatments   Labs (all labs ordered are listed, but only abnormal results are displayed) Labs Reviewed  COMPREHENSIVE METABOLIC PANEL - Abnormal; Notable for the following components:      Result Value   Sodium 130 (*)    Chloride 97 (*)    All other components within normal limits  I-STAT CHEM 8, ED - Abnormal; Notable for the following components:   Sodium 130 (*)    Chloride 97 (*)    Calcium, Ion 1.11 (*)    All other components within normal limits  RESP PANEL BY RT-PCR (FLU A&B, COVID) ARPGX2  PROTIME-INR  APTT  CBC  DIFFERENTIAL  CBG MONITORING, ED    EKG EKG Interpretation  Date/Time:  Saturday December 09 2020 13:11:30 EDT Ventricular Rate:  66 PR Interval:  166 QRS Duration: 135 QT Interval:  412 QTC Calculation: 432 R Axis:   -65 Text Interpretation: Sinus rhythm Consider left atrial enlargement RBBB and LAFB ST elevation, consider anterolateral injury No old tracing to compare Confirmed by Aletta Edouard [phone]) on 12/09/2020 1:14:00 PM  Radiology No results found.  Procedures Procedures   Medications Ordered in ED Medications  sodium chloride flush (NS) 0.9 % injection 3 mL (has no administration in time range)  diazepam (VALIUM) injection 5 mg (5 mg Intravenous Given 12/09/20 1602)    ED Course  I have reviewed the triage vital signs and the nursing notes.  Pertinent  labs & imaging results that were available during my care of the patient were reviewed by me and considered in my medical decision making (see chart for details).  Clinical Course as of 12/09/20 1742  Sat Dec 09, 2020  1252 Reviewed with Dr. Curly Shores from neuro hospitalist service.  She is recommending an MRI brain. [MB]    Clinical Course User Index [MB] Hayden Rasmussen, MD   MDM Rules/Calculators/A&P                         This patient complains of left-sided visual field cut; this involves an extensive number of treatment Options and is a complaint that carries with it a high risk of complications and Morbidity. The differential includes stroke, retinal detachment, tumor, mass  I ordered, reviewed and interpreted labs, which included CBC with normal white count normal hemoglobin, chemistries with mildly low sodium and chloride, normal LFTs, normal coags I ordered medication IV Valium for patient sedation for MRI I ordered imaging studies which included MRI brain and I independently    visualized and interpreted imaging which showed acute occipital infarct Additional history obtained from patient's wife Previous records obtained and reviewed in epic, no recent presentations I consulted Dr. Curly Shores neuro hospitalist and discussed lab and imaging findings  Critical Interventions: None  After the interventions stated above, I reevaluated the patient and found patient still to be symptomatic.  His care is signed out to oncoming provider Dr. Tyrone Nine to follow-up on results of MRI.  He will likely need to be admitted to the hospital for further neuro work-up.   Final Clinical Impression(s) / ED Diagnoses Final diagnoses:  Cerebral infarction, unspecified mechanism ([place])  Visual field cut  Rx / DC Orders ED Discharge Orders     None        Hayden Rasmussen, MD 12/09/20 1745

## 2020-12-09 NOTE — ED Provider Notes (Signed)
I received the patient in signout from Dr. Melina Copa, briefly the patient is a 80 year old male with a chief complaint of a leftward visual field deficit in both eyes.  Concerned about an acute stroke discussed with neurology recommended an MRI.  I was called by the radiologist and had a discussion with him about the results.  Patient has an occipital stroke which accounts for his symptoms.  Discussed with neurology who recommended medical admission.   Deno Etienne, DO 12/09/20 [id number]

## 2020-12-09 NOTE — Consult Note (Signed)
Referring Physician: Dr. Wyline Copas    Chief Complaint: Acute onset of left sided visual loss, now resolved  HPI: Kenneth Montoya is an 80 y.o. male with a PMHx of osteoarthritis, bilateral knee replacements, bilateral shoulder surgeries, BPH, bilateral cataracts, HTN and shingles who presented to the hospital yesterday with a c/c of dark clouding of the left side of his visual field in his left eye, beginning at 0330. LKN was 11 PM on Friday. He did not experience any double vision, aphasia, dysarthria, facial droop, numbness, tingling, limb weakness or loss of balance. MRI obtained in the ED revealed a subacute right occipital lobe ischemic infarction.   LSN: 11 PM Friday tPA Given: No: Out of the time window  Past Medical History:  Diagnosis Date   Arthritis    generalized   BPH (benign prostatic hyperplasia)    Cancer of skin, squamous cell    frozen    Cataract    bilateral sx   GERD (gastroesophageal reflux disease)    on meds   Hypertension    on meds   Shingles     Past Surgical History:  Procedure Laterality Date   CATARACT EXTRACTION, BILATERAL     COLONOSCOPY  06/27/2020   COLONOSCOPY  2018   gum graft     INGUINAL HERNIA REPAIR     as infant   LUMBAR LAMINECTOMY  06/2015   POLYPECTOMY  2018   TA x 3   REVERSE TOTAL SHOULDER ARTHROPLASTY Bilateral    TONSILLECTOMY AND ADENOIDECTOMY     age 30    TOTAL KNEE ARTHROPLASTY Bilateral    TOTAL KNEE REVISION Right 12/25/2017   TRANSURETHRAL RESECTION OF PROSTATE     x 2   UPPER GASTROINTESTINAL ENDOSCOPY      Family History  Problem Relation Age of Onset   Colon polyps Mother 45   Colon cancer Neg Hx    Esophageal cancer Neg Hx    Rectal cancer Neg Hx    Stomach cancer Neg Hx    Social History:  reports that he has never smoked. He has never used smokeless tobacco. He reports current alcohol use of about 2.0 standard drinks of alcohol per week. He reports that he does not use drugs.  Allergies: No Known  Allergies  Medications: Prior to Admission:  Medications Prior to Admission  Medication Sig Dispense Refill Last Dose   acetaminophen (TYLENOL) 500 MG tablet Take 1,000 mg by mouth in the morning and at bedtime.   12/09/2020   amLODipine (NORVASC) 10 MG tablet TAKE 1 TABLET BY MOUTH AT  BEDTIME 90 tablet 1 12/08/2020   amoxicillin (AMOXIL) 500 MG tablet amoxicillin 500 mg tablet  TK 4 TS PO 1 HOUR PRIOR TO DENTAL APPOINTMENT   unknown   ascorbic acid (VITAMIN C) 1000 MG tablet Take 1,000 mg by mouth at bedtime.   12/08/2020   fluticasone (FLONASE) 50 MCG/ACT nasal spray Place 1 spray into both nostrils daily. 48 g 1 12/08/2020   losartan (COZAAR) 100 MG tablet TAKE 1 TABLET BY MOUTH  DAILY 90 tablet 1 12/08/2020   naproxen sodium (ALEVE) 220 MG tablet Take 440 mg by mouth in the morning and at bedtime.   12/09/2020   olopatadine (PATANOL) 0.1 % ophthalmic solution Place 1 drop into both eyes at bedtime.   12/08/2020   omeprazole (PRILOSEC) 40 MG capsule TAKE ONE CAPSULE BY MOUTH  DAILY 90 capsule 1 12/09/2020   psyllium (METAMUCIL) 58.6 % powder Take 1 packet by mouth  at bedtime.   12/08/2020   tamsulosin (FLOMAX) 0.4 MG CAPS capsule Take 0.4 mg by mouth at bedtime.   12/08/2020   hydrOXYzine (ATARAX/VISTARIL) 10 MG tablet Take 1 tablet (10 mg total) by mouth every 4 (four) hours as needed. (Patient not taking: No sig reported) 65 tablet 1 Completed Course   traZODone (DESYREL) 50 MG tablet Take 0.5 tablets (25 mg total) by mouth at bedtime as needed for sleep. (Patient not taking: No sig reported) 45 tablet 1 Completed Course   valACYclovir (VALTREX) 1000 MG tablet Take 1 tablet (1,000 mg total) by mouth 2 (two) times daily. (Patient not taking: No sig reported) 14 tablet 4 Completed Course   Scheduled:  aspirin EC  81 mg Oral Daily   enoxaparin (LOVENOX) injection  40 mg Subcutaneous Q24H   fluticasone  1 spray Each Nare Daily   olopatadine  1 drop Both Eyes QHS   pantoprazole  40 mg Oral Daily    psyllium  1 packet Oral Daily   sodium chloride flush  3 mL Intravenous Once   tamsulosin  0.4 mg Oral QHS    ROS: As per HPI.   Physical Examination: Blood pressure (!) 176/97, pulse 79, temperature 97.7 F (36.5 C), temperature source Oral, resp. rate 13, height 5\' 9"  (1.753 m), weight 97.1 kg, SpO2 99 %.  HEENT: [place]/AT Lungs: Respirations unlabored Ext: No edema  Neurologic Examination: Mental Status: Awake and alert. Fully oriented. Speech fluent without evidence of aphasia.  Able to follow all commands without difficulty. Cranial Nerves: II:  Visual fields are tested in each eye individually in all 4 quadrants with no visual cut detected. No extinction to DSS. PERRL.  III,IV, VI: EOMI V,VII: Smile symmetric, facial temp sensation normal bilaterally VIII: HOH IX,X: No hypophonia XI: Mild asymmetry to shoulder shrug given prior surgeries XII: midline tongue extension  Motor: Right : Upper extremity   5/5    Left:     Upper extremity   5/5  Lower extremity   5/5     Lower extremity   5/5 Sensory: Temp and light touch intact throughout, bilaterally Deep Tendon Reflexes:  Normoactive x 4 Cerebellar: No ataxia with FNF bilaterally Gait: Unremarkable in the context of joint stiffness.    Results for orders placed or performed during the hospital encounter of 12/09/20 (from the past 48 hour(s))  Protime-INR     Status: None   Collection Time: 12/09/20 12:07 PM  Result Value Ref Range   Prothrombin Time 13.1 11.4 - 15.2 seconds   INR 1.0 0.8 - 1.2    Comment: (NOTE) INR goal varies based on device and disease states. Performed at [facility], [place] [address], [place], [place] [address]   APTT     Status: None   Collection Time: 12/09/20 12:07 PM  Result Value Ref Range   aPTT 31 24 - 36 seconds    Comment: Performed at [facility] [address] [place] [address]  CBC     Status: None   Collection Time: 12/09/20 12:07 PM  Result Value Ref Range    WBC 8.1 4.0 - 10.5 K/uL   RBC 4.25 4.22 - 5.81 MIL/uL   Hemoglobin 13.6 13.0 - 17.0 g/dL   HCT 39.4 39.0 - 52.0 %   MCV 92.7 80.0 - 100.0 fL   MCH 32.0 26.0 - 34.0 pg   MCHC 34.5 30.0 - 36.0 g/dL   RDW 12.7 11.5 - 15.5 %  Platelets 213 150 - 400 K/uL   nRBC 0.0 0.0 - 0.2 %    Comment: Performed at [facility], [place] [address], [place], [place] [address]  Differential     Status: None   Collection Time: 12/09/20 12:07 PM  Result Value Ref Range   Neutrophils Relative % 69 %   Neutro Abs 5.7 1.7 - 7.7 K/uL   Lymphocytes Relative 18 %   Lymphs Abs 1.5 0.7 - 4.0 K/uL   Monocytes Relative 8 %   Monocytes Absolute 0.7 0.1 - 1.0 K/uL   Eosinophils Relative 3 %   Eosinophils Absolute 0.2 0.0 - 0.5 K/uL   Basophils Relative 1 %   Basophils Absolute 0.1 0.0 - 0.1 K/uL   Immature Granulocytes 1 %   Abs Immature Granulocytes 0.05 0.00 - 0.07 K/uL    Comment: Performed at [facility] [address], [place], [place] [address]  Comprehensive metabolic panel     Status: Abnormal   Collection Time: 12/09/20 12:07 PM  Result Value Ref Range   Sodium 130 (L) 135 - 145 mmol/L   Potassium 4.5 3.5 - 5.1 mmol/L   Chloride 97 (L) 98 - 111 mmol/L   CO2 25 22 - 32 mmol/L   Glucose, Bld 75 70 - 99 mg/dL    Comment: Glucose reference range applies only to samples taken after fasting for at least 8 hours.   BUN 18 8 - 23 mg/dL   Creatinine, Ser 1.00 0.61 - 1.24 mg/dL   Calcium 9.2 8.9 - 10.3 mg/dL   Total Protein 7.3 6.5 - 8.1 g/dL   Albumin 4.5 3.5 - 5.0 g/dL   AST 23 15 - 41 U/L   ALT 18 0 - 44 U/L   Alkaline Phosphatase 79 38 - 126 U/L   Total Bilirubin 0.8 0.3 - 1.2 mg/dL   GFR, Estimated >60 >60 mL/min    Comment: (NOTE) Calculated using the CKD-EPI Creatinine Equation (2021)    Anion gap 8 5 - 15    Comment: Performed at [facility] [address], [place], [place] [address]  I-stat chem 8, ED     Status: Abnormal   Collection Time: 12/09/20 12:14 PM  Result  Value Ref Range   Sodium 130 (L) 135 - 145 mmol/L   Potassium 4.5 3.5 - 5.1 mmol/L   Chloride 97 (L) 98 - 111 mmol/L   BUN 20 8 - 23 mg/dL   Creatinine, Ser 1.00 0.61 - 1.24 mg/dL   Glucose, Bld 73 70 - 99 mg/dL    Comment: Glucose reference range applies only to samples taken after fasting for at least 8 hours.   Calcium, Ion 1.11 (L) 1.15 - 1.40 mmol/L   TCO2 25 22 - 32 mmol/L   Hemoglobin 14.3 13.0 - 17.0 g/dL   HCT 42.0 39.0 - 52.0 %  CBG monitoring, ED     Status: None   Collection Time: 12/09/20 12:32 PM  Result Value Ref Range   Glucose-Capillary 91 70 - 99 mg/dL    Comment: Glucose reference range applies only to samples taken after fasting for at least 8 hours.  Resp Panel by RT-PCR (Flu A&B, Covid) Nasopharyngeal Swab     Status: Abnormal   Collection Time: 12/09/20  5:40 PM   Specimen: Nasopharyngeal Swab; Nasopharyngeal(NP) swabs in vial transport medium  Result Value Ref Range   SARS Coronavirus 2 by RT PCR POSITIVE (A) NEGATIVE    Comment: RESULT CALLED  TO, READ BACK BY AND VERIFIED WITH: RN K.OWENS ON [id number] AT 1918 BY E.PARRISH (NOTE) SARS-CoV-2 target nucleic acids are DETECTED.  The SARS-CoV-2 RNA is generally detectable in upper respiratory specimens during the acute phase of infection. Positive results are indicative of the presence of the identified virus, but do not rule out bacterial infection or co-infection with other pathogens not detected by the test. Clinical correlation with patient history and other diagnostic information is necessary to determine patient infection status. The expected result is Negative.  Fact Sheet for Patients: EntrepreneurPulse.com.au  Fact Sheet for Healthcare Providers: IncredibleEmployment.be  This test is not yet approved or cleared by the [place] FDA and  has been authorized for detection and/or diagnosis of SARS-CoV-2 by FDA under an Emergency Use Authorization (EUA).  This  EUA will remain in effect (meaning this t est can be used) for the duration of  the COVID-19 declaration under Section 564(b)(1) of the Act, 21 U.S.C. section 360bbb-3(b)(1), unless the authorization is terminated or revoked sooner.     Influenza A by PCR NEGATIVE NEGATIVE   Influenza B by PCR NEGATIVE NEGATIVE    Comment: (NOTE) The Xpert Xpress SARS-CoV-2/FLU/RSV plus assay is intended as an aid in the diagnosis of influenza from Nasopharyngeal swab specimens and should not be used as a sole basis for treatment. Nasal washings and aspirates are unacceptable for Xpert Xpress SARS-CoV-2/FLU/RSV testing.  Fact Sheet for Patients: EntrepreneurPulse.com.au  Fact Sheet for Healthcare Providers: IncredibleEmployment.be  This test is not yet approved or cleared by the [place] FDA and has been authorized for detection and/or diagnosis of SARS-CoV-2 by FDA under an Emergency Use Authorization (EUA). This EUA will remain in effect (meaning this test can be used) for the duration of the COVID-19 declaration under Section 564(b)(1) of the Act, 21 U.S.C. section 360bbb-3(b)(1), unless the authorization is terminated or revoked.  Performed at [facility], [place] [address], [place], [place] [address]    MR BRAIN WO CONTRAST  Result Date: 12/09/2020 CLINICAL DATA:  Left visual field cut. EXAM: MRI HEAD WITHOUT CONTRAST TECHNIQUE: Multiplanar, multiecho pulse sequences of the brain and surrounding structures were obtained without intravenous contrast. COMPARISON:  None. FINDINGS: Brain: Acute nonhemorrhagic infarct is present in the medial right occipital lobe, consistent visual field cut. T2 FLAIR signal changes are associated with the infarct. Remote cortical infarct present posterior right middle frontal gyrus. Extensive periventricular and scattered subcortical T2 hyperintensities are present bilaterally. Basal ganglia are intact. Mild white  matter changes extend into brainstem. Remote lacunar infarcts are present cerebellum, left greater than right. No acute hemorrhage or mass lesion is present. The ventricles are of normal size. No significant extraaxial fluid collection is present. Vascular: Flow is present in the major intracranial arteries. Skull and upper cervical spine: The craniocervical junction is normal. Upper cervical spine is within normal limits. Marrow signal is unremarkable. Sinuses/Orbits: Diffuse mucosal thickening scattered polypoid lesions are present maxillary sinuses bilaterally. There is scattered opacification of ethmoid air cells bilaterally. Thickening present in the frontal sinuses. The mastoid air cells are clear. Bilateral lens replacements are noted. Globes and orbits are otherwise unremarkable. IMPRESSION: 1. Acute nonhemorrhagic infarct involving the medial right occipital lobe. This corresponds with the visual field cut. 2. Remote cortical infarct of the posterior right middle frontal gyrus. 3. Extensive periventricular and scattered subcortical T2 hyperintensities bilaterally are moderately advanced for age. This likely reflects the sequela of chronic microvascular ischemia. 4. Moderate diffuse sinus disease. These results were called  by telephone at the time of interpretation on 12/09/2020 at 5:03 pm to provider DAN FLOYD, who verbally acknowledged these results. Electronically Signed   By: San Morelle M.D.   On: 12/09/2020 17:04    Assessment: 80 y.o. Covid+ (asymptomatic) male who presents with transient left sided visual obscuration. MRI brain revealed a subacute right occipital lobe ischemic infarction.  1. Exam reveals no focal deficit 2. MRI brain: Acute nonhemorrhagic infarct involving the medial right occipital lobe. This corresponds with the visual field cut. Remote cortical infarct of the posterior right middle frontal gyrus. Extensive periventricular and scattered subcortical T2 hyperintensities  bilaterally are moderately advanced for age. This likely reflects the sequela of chronic microvascular ischemia.  3. MRA head: Negative intracranial MRA for large vessel occlusion. Severe distal left P2 and right P3 stenoses. 3. Otherwise wide patency of the major intracranial arterial circulation.  4. MRA neck: Short-segment moderate stenosis of up to 40-50% about the proximal right ICA. No significant stenosis involving the left carotid artery system. Both vertebral arteries widely patent within the neck. Right vertebral artery dominant. 5. EKG: Sinus rhythm, Consider left atrial enlargement, RBBB and LAFB, ST elevation, consider anterolateral injury 6. Stroke Risk Factors - HTN  Recommendations: 1. HgbA1c, fasting lipid panel 2. TTE 3. PT consult, OT consult, Speech consult 4. Cardiac telemetry 5. Frequent neuro checks 6. Prophylactic therapy- ASA 7. Risk factor modification 8. BP management 9. Consider a statin, although risks may outweigh benefits given his advanced age.  10. BP management  @Electronically  signed: Dr. Kerney Elbe  12/09/2020, 7:33 PM

## 2020-12-09 NOTE — ED Notes (Signed)
Patient transported to MRI 

## 2020-12-09 NOTE — Progress Notes (Addendum)
COVID test positive. Ordered COVID lab. Patient updated.  D/W patient regarding his fatigue symptoms yesterday might be related to COVID infection. Patient denied any breathing symptoms such as cough, sore throat, SOB. I propose monoclonal ABX, but patient declined the offer.  COVID lab ordered.

## 2020-12-09 NOTE — H&P (Signed)
History and Physical    Kenneth Montoya IPJ:[id number] DOB: 08-10-1940 DOA: 12/09/2020  PCP: Janith Lima, MD (Confirm with patient/family/NH records and if not entered, this has to be entered at [facility] point of entry) Patient coming from: Home  I have personally briefly reviewed patient's old medical records in [place]  Chief Complaint: Clouds blocking my vision  HPI: Kenneth Montoya is a 80 y.o. male with medical history significant of HTN, chronic hyponatremia, BPH, cataract, chronic RBBB, GERD, presented with vision changes.  Patient woke this morning, felt " dark clouds developed block left-sided vision" denies any headache, no hearing changes no numbness or weakness of any of the limbs.  Yesterday, patient felt very tired, he used to able to walk > 45 minutes, but yesterday he was only able to walk about 15 minutes and feels very tired and retired.  No shortness of breath or chest pains.  He has no history of A. fib, stroke or MI.  ED Course: Blood pressure significant elevated, symptoms wise, patient reports improvement of the vision blockage, MRI showed acute right occipital lobe infarct.  Review of Systems: As per HPI otherwise 14 point review of systems negative.    Past Medical History:  Diagnosis Date   Arthritis    generalized   BPH (benign prostatic hyperplasia)    Cancer of skin, squamous cell    frozen    Cataract    bilateral sx   GERD (gastroesophageal reflux disease)    on meds   Hypertension    on meds   Shingles     Past Surgical History:  Procedure Laterality Date   CATARACT EXTRACTION, BILATERAL     COLONOSCOPY  06/27/2020   COLONOSCOPY  2018   gum graft     INGUINAL HERNIA REPAIR     as infant   LUMBAR LAMINECTOMY  06/2015   POLYPECTOMY  2018   TA x 3   REVERSE TOTAL SHOULDER ARTHROPLASTY Bilateral    TONSILLECTOMY AND ADENOIDECTOMY     age 66    TOTAL KNEE ARTHROPLASTY Bilateral    TOTAL KNEE REVISION Right 12/25/2017    TRANSURETHRAL RESECTION OF PROSTATE     x 2   UPPER GASTROINTESTINAL ENDOSCOPY       reports that he has never smoked. He has never used smokeless tobacco. He reports current alcohol use of about 2.0 standard drinks of alcohol per week. He reports that he does not use drugs.  No Known Allergies  Family History  Problem Relation Age of Onset   Colon polyps Mother 31   Colon cancer Neg Hx    Esophageal cancer Neg Hx    Rectal cancer Neg Hx    Stomach cancer Neg Hx      Prior to Admission medications   Medication Sig Start Date End Date Taking? Authorizing Provider  acetaminophen (TYLENOL) 500 MG tablet Take 1,000 mg by mouth in the morning and at bedtime.   Yes [provider]  amLODipine (NORVASC) 10 MG tablet TAKE 1 TABLET BY MOUTH AT  BEDTIME 09/09/20  Yes Janith Lima, MD  amoxicillin (AMOXIL) 500 MG tablet amoxicillin 500 mg tablet  TK 4 TS PO 1 HOUR PRIOR TO DENTAL APPOINTMENT   Yes [provider]  ascorbic acid (VITAMIN C) 1000 MG tablet Take 1,000 mg by mouth at bedtime.   Yes [provider]  fluticasone (FLONASE) 50 MCG/ACT nasal spray Place 1 spray into both nostrils daily. 04/03/20  Yes Ronnald Ramp,  Arvid Right, MD  losartan (COZAAR) 100 MG tablet TAKE 1 TABLET BY MOUTH  DAILY 08/10/20  Yes Janith Lima, MD  naproxen sodium (ALEVE) 220 MG tablet Take 440 mg by mouth in the morning and at bedtime.   Yes [provider]  olopatadine (PATANOL) 0.1 % ophthalmic solution Place 1 drop into both eyes at bedtime.   Yes [provider]  omeprazole (PRILOSEC) 40 MG capsule TAKE ONE CAPSULE BY MOUTH  DAILY 08/03/20  Yes Janith Lima, MD  psyllium (METAMUCIL) 58.6 % powder Take 1 packet by mouth at bedtime.   Yes [provider]  tamsulosin (FLOMAX) 0.4 MG CAPS capsule Take 0.4 mg by mouth at bedtime. 09/17/19  Yes [provider]  hydrOXYzine (ATARAX/VISTARIL) 10 MG tablet Take 1 tablet (10 mg total) by mouth every 4 (four)  hours as needed. Patient not taking: No sig reported 11/21/20   Janith Lima, MD  traZODone (DESYREL) 50 MG tablet Take 0.5 tablets (25 mg total) by mouth at bedtime as needed for sleep. Patient not taking: No sig reported 11/30/20   Janith Lima, MD  valACYclovir (VALTREX) 1000 MG tablet Take 1 tablet (1,000 mg total) by mouth 2 (two) times daily. Patient not taking: No sig reported 11/21/20   Janith Lima, MD    Physical Exam: Vitals:   12/09/20 1545 12/09/20 1600 12/09/20 1654 12/09/20 1715  BP: (!) 150/82 (!) 152/86 (!) 161/92 (!) 153/124  Pulse: 69 68 71 69  Resp: 15 15 20 14   Temp:      TempSrc:      SpO2: 99% 98% 98% 98%  Weight:      Height:        Constitutional: NAD, calm, comfortable Vitals:   12/09/20 1545 12/09/20 1600 12/09/20 1654 12/09/20 1715  BP: (!) 150/82 (!) 152/86 (!) 161/92 (!) 153/124  Pulse: 69 68 71 69  Resp: 15 15 20 14   Temp:      TempSrc:      SpO2: 99% 98% 98% 98%  Weight:      Height:       Eyes: PERRL, lids and conjunctivae normal ENMT: Mucous membranes are moist. Posterior pharynx clear of any exudate or lesions.Normal dentition.  Neck: normal, supple, no masses, no thyromegaly Respiratory: clear to auscultation bilaterally, no wheezing, no crackles. Normal respiratory effort. No accessory muscle use.  Cardiovascular: Regular rate and rhythm, no murmurs / rubs / gallops. No extremity edema. 2+ pedal pulses. No carotid bruits.  Abdomen: no tenderness, no masses palpated. No hepatosplenomegaly. Bowel sounds positive.  Musculoskeletal: no clubbing / cyanosis. No joint deformity upper and lower extremities. Good ROM, no contractures. Normal muscle tone.  Skin: no rashes, lesions, ulcers. No induration Neurologic: Decreased vision on the left visual field. Sensation intact, DTR normal. Strength 5/5 in all 4.  Psychiatric: Normal judgment and insight. Alert and oriented x 3. Normal mood.     Labs on Admission: I have personally reviewed  following labs and imaging studies  CBC: Recent Labs  Lab 12/09/20 1207 12/09/20 1214  WBC 8.1  --   NEUTROABS 5.7  --   HGB 13.6 14.3  HCT 39.4 42.0  MCV 92.7  --   PLT 213  --    Basic Metabolic Panel: Recent Labs  Lab 12/09/20 1207 12/09/20 1214  NA 130* 130*  K 4.5 4.5  CL 97* 97*  CO2 25  --   GLUCOSE 75 73  BUN 18 20  CREATININE  1.00 1.00  CALCIUM 9.2  --    GFR: Estimated Creatinine Clearance: 66.6 mL/min (by C-G formula based on SCr of 1 mg/dL). Liver Function Tests: Recent Labs  Lab 12/09/20 1207  AST 23  ALT 18  ALKPHOS 79  BILITOT 0.8  PROT 7.3  ALBUMIN 4.5   No results for input(s): LIPASE, AMYLASE in the last 168 hours. No results for input(s): AMMONIA in the last 168 hours. Coagulation Profile: Recent Labs  Lab 12/09/20 1207  INR 1.0   Cardiac Enzymes: No results for input(s): CKTOTAL, CKMB, CKMBINDEX, TROPONINI in the last 168 hours. BNP (last 3 results) No results for input(s): PROBNP in the last 8760 hours. HbA1C: No results for input(s): HGBA1C in the last 72 hours. CBG: Recent Labs  Lab 12/09/20 1232  GLUCAP 91   Lipid Profile: No results for input(s): CHOL, HDL, LDLCALC, TRIG, CHOLHDL, LDLDIRECT in the last 72 hours. Thyroid Function Tests: No results for input(s): TSH, T4TOTAL, FREET4, T3FREE, THYROIDAB in the last 72 hours. Anemia Panel: No results for input(s): VITAMINB12, FOLATE, FERRITIN, TIBC, IRON, RETICCTPCT in the last 72 hours. Urine analysis: No results found for: COLORURINE, APPEARANCEUR, LABSPEC, PHURINE, GLUCOSEU, HGBUR, BILIRUBINUR, KETONESUR, PROTEINUR, UROBILINOGEN, NITRITE, LEUKOCYTESUR  Radiological Exams on Admission: MR BRAIN WO CONTRAST  Result Date: 12/09/2020 CLINICAL DATA:  Left visual field cut. EXAM: MRI HEAD WITHOUT CONTRAST TECHNIQUE: Multiplanar, multiecho pulse sequences of the brain and surrounding structures were obtained without intravenous contrast. COMPARISON:  None. FINDINGS: Brain: Acute  nonhemorrhagic infarct is present in the medial right occipital lobe, consistent visual field cut. T2 FLAIR signal changes are associated with the infarct. Remote cortical infarct present posterior right middle frontal gyrus. Extensive periventricular and scattered subcortical T2 hyperintensities are present bilaterally. Basal ganglia are intact. Mild white matter changes extend into brainstem. Remote lacunar infarcts are present cerebellum, left greater than right. No acute hemorrhage or mass lesion is present. The ventricles are of normal size. No significant extraaxial fluid collection is present. Vascular: Flow is present in the major intracranial arteries. Skull and upper cervical spine: The craniocervical junction is normal. Upper cervical spine is within normal limits. Marrow signal is unremarkable. Sinuses/Orbits: Diffuse mucosal thickening scattered polypoid lesions are present maxillary sinuses bilaterally. There is scattered opacification of ethmoid air cells bilaterally. Thickening present in the frontal sinuses. The mastoid air cells are clear. Bilateral lens replacements are noted. Globes and orbits are otherwise unremarkable. IMPRESSION: 1. Acute nonhemorrhagic infarct involving the medial right occipital lobe. This corresponds with the visual field cut. 2. Remote cortical infarct of the posterior right middle frontal gyrus. 3. Extensive periventricular and scattered subcortical T2 hyperintensities bilaterally are moderately advanced for age. This likely reflects the sequela of chronic microvascular ischemia. 4. Moderate diffuse sinus disease. These results were called by telephone at the time of interpretation on 12/09/2020 at 5:03 pm to provider DAN FLOYD, who verbally acknowledged these results. Electronically Signed   By: San Morelle M.D.   On: 12/09/2020 17:04    EKG: Independently reviewed.  Sinus, chronic RBBB  Assessment/Plan Active Problems:   Stroke [facility])   CVA (cerebral  vascular accident) ([place])  (please populate well all problems here in Problem List. (For example, if patient is on BP meds at home and you resume or decide to hold them, it is a problem that needs to be her. Same for CAD, COPD, HLD and so on)  Right occipital acute CVA -With left lateral visual field loss, improved. -MRA head and neck -Telemetry monitoring  x24 hours to rule out A. fib.  If negative expect patient discharged with loop recorder. -Start ASA, check lipid panel and A1c.  HTN, uncontrolled -Hold home BP medication amlodipine and losartan, start as needed hydralazine for blood pressure 200/110.  Chronic hyponatremia -Asymptomatic, outpatient PCP follow-up.  BPH -Continue Flomax  DVT prophylaxis: Lovenox Code Status: Full code Family Communication: Wife at bedside Disposition Plan: Expect less than 2 midnight hospital stay likely can go home tomorrow after stroke work-up. Consults called: Neurology Admission status: Telemetry observation   Lequita Halt MD [facility] Pager [phone]  12/09/2020, 5:57 PM

## 2020-12-10 ENCOUNTER — Observation Stay (HOSPITAL_COMMUNITY): Payer: Medicare Other

## 2020-12-10 ENCOUNTER — Other Ambulatory Visit: Payer: Self-pay | Admitting: Physician Assistant

## 2020-12-10 DIAGNOSIS — I639 Cerebral infarction, unspecified: Secondary | ICD-10-CM

## 2020-12-10 DIAGNOSIS — I6300 Cerebral infarction due to thrombosis of unspecified precerebral artery: Secondary | ICD-10-CM

## 2020-12-10 DIAGNOSIS — I63531 Cerebral infarction due to unspecified occlusion or stenosis of right posterior cerebral artery: Secondary | ICD-10-CM | POA: Diagnosis not present

## 2020-12-10 DIAGNOSIS — F5104 Psychophysiologic insomnia: Secondary | ICD-10-CM | POA: Diagnosis not present

## 2020-12-10 LAB — COMPREHENSIVE METABOLIC PANEL
ALT: 14 U/L (ref 0–44)
AST: 20 U/L (ref 15–41)
Albumin: 3.7 g/dL (ref 3.5–5.0)
Alkaline Phosphatase: 61 U/L (ref 38–126)
Anion gap: 7 (ref 5–15)
BUN: 18 mg/dL (ref 8–23)
CO2: 26 mmol/L (ref 22–32)
Calcium: 8.9 mg/dL (ref 8.9–10.3)
Chloride: 97 mmol/L — ABNORMAL LOW (ref 98–111)
Creatinine, Ser: 0.97 mg/dL (ref 0.61–1.24)
GFR, Estimated: >60 mL/min (ref >60)
Glucose, Bld: 129 mg/dL — ABNORMAL HIGH (ref 70–99)
Potassium: 4.1 mmol/L (ref 3.5–5.1)
Sodium: 130 mmol/L — ABNORMAL LOW (ref 135–145)
Total Bilirubin: 0.9 mg/dL (ref 0.3–1.2)
Total Protein: 6.2 g/dL — ABNORMAL LOW (ref 6.5–8.1)

## 2020-12-10 LAB — CBC WITH DIFFERENTIAL/PLATELET
Abs Immature Granulocytes: 0.04 10*3/uL (ref 0.00–0.07)
Basophils Absolute: 0.1 10*3/uL (ref 0.0–0.1)
Basophils Relative: 1 %
Eosinophils Absolute: 0.3 10*3/uL (ref 0.0–0.5)
Eosinophils Relative: 3 %
HCT: 37.4 % — ABNORMAL LOW (ref 39.0–52.0)
Hemoglobin: 12.9 g/dL — ABNORMAL LOW (ref 13.0–17.0)
Immature Granulocytes: 1 %
Lymphocytes Relative: 15 %
Lymphs Abs: 1.3 10*3/uL (ref 0.7–4.0)
MCH: 31.9 pg (ref 26.0–34.0)
MCHC: 34.5 g/dL (ref 30.0–36.0)
MCV: 92.3 fL (ref 80.0–100.0)
Monocytes Absolute: 0.7 10*3/uL (ref 0.1–1.0)
Monocytes Relative: 8 %
Neutro Abs: 6.4 10*3/uL (ref 1.7–7.7)
Neutrophils Relative %: 72 %
Platelets: 209 10*3/uL (ref 150–400)
RBC: 4.05 MIL/uL — ABNORMAL LOW (ref 4.22–5.81)
RDW: 12.8 % (ref 11.5–15.5)
WBC: 8.8 10*3/uL (ref 4.0–10.5)
nRBC: 0 % (ref 0.0–0.2)

## 2020-12-10 LAB — LIPID PANEL
Cholesterol: 167 mg/dL (ref 0–200)
HDL: 71 mg/dL (ref >40)
LDL Cholesterol: 86 mg/dL (ref 0–99)
Total CHOL/HDL Ratio: 2.4 RATIO
Triglycerides: 50 mg/dL (ref <150)
VLDL: 10 mg/dL (ref 0–40)

## 2020-12-10 LAB — MAGNESIUM: Magnesium: 2.1 mg/dL (ref 1.7–2.4)

## 2020-12-10 LAB — FERRITIN: Ferritin: 30 ng/mL (ref 24–336)

## 2020-12-10 LAB — D-DIMER, QUANTITATIVE: D-Dimer, Quant: 1.32 ug/mL-FEU — ABNORMAL HIGH (ref 0.00–0.50)

## 2020-12-10 LAB — PROCALCITONIN: Procalcitonin: <0.1 ng/mL

## 2020-12-10 LAB — PHOSPHORUS: Phosphorus: 4.5 mg/dL (ref 2.5–4.6)

## 2020-12-10 LAB — HEMOGLOBIN A1C
Hgb A1c MFr Bld: 5.8 % — ABNORMAL HIGH (ref 4.8–5.6)
Mean Plasma Glucose: 119.76 mg/dL

## 2020-12-10 LAB — C-REACTIVE PROTEIN: CRP: 0.6 mg/dL (ref <1.0)

## 2020-12-10 MED ORDER — ZINC 220 (50 ZN) MG PO CAPS: 1.0000 | ORAL_CAPSULE | Freq: Every day | ORAL | 0 refills | Status: AC

## 2020-12-10 MED ORDER — CLOPIDOGREL BISULFATE 75 MG PO TABS
300.0000 mg | ORAL_TABLET | Freq: Once | ORAL | Status: AC
  Administered 2020-12-10: 300 mg via ORAL
  Filled 2020-12-10: qty 4

## 2020-12-10 MED ORDER — CLOPIDOGREL BISULFATE 75 MG PO TABS: 75.0000 mg | ORAL_TABLET | Freq: Every day | ORAL | 0 refills | Status: DC

## 2020-12-10 MED ORDER — ASPIRIN 81 MG PO TBEC: 81.0000 mg | DELAYED_RELEASE_TABLET | Freq: Every day | ORAL | 11 refills | Status: AC

## 2020-12-10 MED ORDER — ATORVASTATIN CALCIUM 40 MG PO TABS: 40.0000 mg | ORAL_TABLET | Freq: Every day | ORAL | 2 refills | Status: DC

## 2020-12-10 MED ORDER — ATORVASTATIN CALCIUM 40 MG PO TABS: 40.0000 mg | ORAL_TABLET | Freq: Every day | ORAL | Status: DC

## 2020-12-10 NOTE — Progress Notes (Signed)
Message sent to our scheduler to arrange 30 day event monitor and 2 month follow up with Dr. Tamala Julian to review the heart monitor.

## 2020-12-10 NOTE — Evaluation (Signed)
Occupational Therapy Evaluation Patient Details Name: Kenneth Montoya MRN: [id number] DOB: May 25, 1941 Today's Date: 12/10/2020    History of Present Illness 80 y.o. male presentin to ED 7/16 with transient left sided visual obscuration and fatigue. Imaging (+) R subacute occipital lobe ischemic infarction. Exam (-) for focal deficit. PMHx significant for HTN, chronic hyponatremia, BPH, cataracts, chronic RBBB and GERD.   Clinical Impression   PTA patient was living with his spouse in a private residence and was grossly I with ADLs/IADLs without AD. Patient currently functioning at baseline demonstrating observed ADLs including UB/LB dressing and short-distance functional mobility in hospital room with I. MMT grossly 5/5 in all 4 extremities and no focal deficits noted with visual exam. Patient does not require continued acute occupational therapy services with OT to sign off at this time.     Follow Up Recommendations  No OT follow up    Equipment Recommendations  None recommended by OT    Recommendations for Other Services       Precautions / Restrictions Precautions Precautions: Fall Precaution Comments: HOH      Mobility Bed Mobility               General bed mobility comments: Patient seated in chair upon entry.    Transfers Overall transfer level: Independent                    Balance Overall balance assessment: No apparent balance deficits (not formally assessed)                                         ADL either performed or assessed with clinical judgement   ADL Overall ADL's : Independent                                             Vision Baseline Vision/History: Wears glasses Wears Glasses: At all times Patient Visual Report: Other (comment) (L-sided visual deficits have resolved) Vision Assessment?: No apparent visual deficits     Perception     Praxis      Pertinent Vitals/Pain Pain Assessment:  No/denies pain     Hand Dominance Left   Extremity/Trunk Assessment Upper Extremity Assessment Upper Extremity Assessment: Overall WFL for tasks assessed       Cervical / Trunk Assessment Cervical / Trunk Assessment: Kyphotic   Communication Communication Communication: No difficulties   Cognition Arousal/Alertness: Awake/alert Behavior During Therapy: WFL for tasks assessed/performed Overall Cognitive Status: Within Functional Limits for tasks assessed                                     General Comments  VSS on RA.    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 OT Goal Formulation: With patient  OT Frequency:     Barriers to D/C:            Co-evaluation              AM-PAC OT "6 Clicks" Daily Activity     Outcome Measure Help from another person eating meals?: None Help from another person taking care of personal grooming?: None Help from another person toileting, which includes using toliet, bedpan, or urinal?: None Help from another person bathing (including washing, rinsing, drying)?: None Help from another person to put on and taking off regular upper body clothing?: None Help from another person  to put on and taking off regular lower body clothing?: None 6 Click Score: 24   End of Session Equipment Utilized During Treatment: Gait belt Nurse Communication: Mobility status;Other (comment) (Ok to be independent in room.)  Activity Tolerance: Patient tolerated treatment well Patient left: in chair;with call bell/phone within reach  OT Visit Diagnosis: Low vision, both eyes (H54.2)                Time: 5038-8828 OT Time Calculation (min): 20 min Charges:  OT General Charges $OT Visit: 1 Visit OT Evaluation $OT Eval Low Complexity: 1 Low  Kenneth Montoya Montoya. OTR/L Supplemental OT, Department of rehab services [phone]  Kenneth Montoya. 12/10/2020, 9:24 AM

## 2020-12-10 NOTE — Progress Notes (Signed)
PT Cancellation Note  Patient Details Name: Kenneth Montoya MRN: [id number] DOB: 1940-09-09   Cancelled Treatment:    Reason Eval/Treat Not Completed: PT screened, no needs identified, will sign off (pt refusing physical therapy evaluation, denies residual deficits; independent with mobility with OT upon evaluation).  Wyona Almas, PT, DPT Acute Rehabilitation Services Pager [phone] Office [phone]    Deno Etienne 12/10/2020, 12:21 PM

## 2020-12-10 NOTE — Progress Notes (Signed)
STROKE TEAM PROGRESS NOTE   INTERVAL HISTORY Patient is extremely belligerent and argumentative and anxious and wanting to go home.  He does not trust doctors or hospitals as he feels they do unnecessary work-up.  I was very patient with him and try to explain to him that he had had embolic stroke and he had already had most of the work-up and no source was identified and that he needed to undergo at least 30-day heart monitor as an outpatient to look for paroxysmal A. fib.  He is COVID positivity is likely incidental and not sure that is the cause of his stroke  Vitals:   12/10/20 0000 12/10/20 0329 12/10/20 0752 12/10/20 1148  BP: 133/78 (!) 144/81 (!) 158/78 (!) 166/91  Pulse: 83 68 84 92  Resp: 18 16 17 20   Temp: 98.6 F (37 C) 97.7 F (36.5 C) 97.6 F (36.4 C) 97.7 F (36.5 C)  TempSrc: Oral Oral Oral Oral  SpO2: 100%     Weight:      Height:       CBC:  Recent Labs  Lab 12/09/20 1207 12/09/20 1214 12/10/20 0515  WBC 8.1  --  8.8  NEUTROABS 5.7  --  6.4  HGB 13.6 14.3 12.9*  HCT 39.4 42.0 37.4*  MCV 92.7  --  92.3  PLT 213  --  712   Basic Metabolic Panel:  Recent Labs  Lab 12/09/20 1207 12/09/20 1214 12/10/20 0515  NA 130* 130* 130*  K 4.5 4.5 4.1  CL 97* 97* 97*  CO2 25  --  26  GLUCOSE 75 73 129*  BUN 18 20 18   CREATININE 1.00 1.00 0.97  CALCIUM 9.2  --  8.9  MG  --   --  2.1  PHOS  --   --  4.5   Lipid Panel:  Recent Labs  Lab 12/10/20 0515  CHOL 167  TRIG 50  HDL 71  CHOLHDL 2.4  VLDL 10  LDLCALC 86   HgbA1c:  Recent Labs  Lab 12/10/20 0515  HGBA1C 5.8*   Urine Drug Screen: No results for input(s): LABOPIA, COCAINSCRNUR, LABBENZ, AMPHETMU, THCU, LABBARB in the last 168 hours.  Alcohol Level No results for input(s): ETH in the last 168 hours.  IMAGING past 24 hours MR ANGIO HEAD WO CONTRAST Result Date: 12/09/2020 IMPRESSION: MRA HEAD:  1. Negative intracranial MRA for large vessel occlusion.  2. Severe distal left P2 and right P3  stenoses.  3. Otherwise wide patency of the major intracranial arterial circulation. No proximal high-grade or correctable stenosis.   MRA NECK:  1. Short-segment moderate stenosis of up to 40-50% about the proximal right ICA.  2. No significant stenosis involving the left carotid artery system.  3. Both vertebral arteries widely patent within the neck. Right vertebral artery dominant.    MR BRAIN WO CONTRAST Result Date: 12/09/2020 IMPRESSION:  1. Acute nonhemorrhagic infarct involving the medial right occipital lobe. This corresponds with the visual field cut.  2. Remote cortical infarct of the posterior right middle frontal gyrus.  3. Extensive periventricular and scattered subcortical T2 hyperintensities bilaterally are moderately advanced for age. This likely reflects the sequela of chronic microvascular ischemia.  4. Moderate diffuse sinus disease.    DG CHEST PORT 1 VIEW Result Date: 12/10/2020 IMPRESSION: Chronic lung disease with hyperinflation. No acute cardiopulmonary abnormality.   PHYSICAL EXAM Obese elderly Caucasian male not in distress.  Appears quite restless and wants to leave. . Afebrile. Head is nontraumatic. Neck  is supple without bruit.    Cardiac exam no murmur or gallop. Lungs are clear to auscultation. Distal pulses are well felt.  Neurological Exam ;  Awake  Alert oriented x 3. Normal speech and language.eye movements full without nystagmus.fundi were not visualized. Vision acuity appears normal.  Partial left homonymous hemianopsia to bedside confrontational testing hearing is normal. Palatal movements are normal. Face symmetric. Tongue midline. Normal strength, tone, reflexes and coordination. Normal sensation. Gait deferred.  ASSESSMENT/PLAN Kenneth Montoya is a 80 y.o. male with history of osteoarthritis, bilateral knee replacements, bilateral shoulder surgeries, BPH, bilateral cataracts, HTN and shingles who presented with transient left sided visual  obscuration.  Stroke:  acute infarct medial right occipital lobe likely embolic.  Source unidentified.  Possibly COVID hypercoagulability but paroxysmal A. fib needs to be looked for MRI brain: acute infarct medial right occipital lobe MRA head: Severe distal left P2 and right P3 stenoses MRA neck: proximal right ICA 40-50% stenosis  2D Echo pending 30 day cardiac monitor to evaluate for atrial fibrillation, The monitor will be mailed to the patient's home.  Follow-up with Dr. Tamala Julian in 2 months. LDL 86 HgbA1c 5.8 VTE prophylaxis - Lovenox    Diet   Diet Heart Room service appropriate? Yes; Fluid consistency: Thin   No antithrombotic prior to admission, now on aspirin 81 mg daily. Recommend DAPT with Aspirin 81mg  and Plavix 75mg  for 3 weeks then aspirin alone Therapy recommendations:  no PT/OT needs Disposition:  likely home today  Hypertension Home meds:  amlodipine 10mg  daily, cozaar 100mg  daily,  Stable Permissive hypertension (OK if < 220/120) but gradually normalize in 5-7 days Long-term BP goal normotensive  Hyperlipidemia Home meds:  none,  LDL 86, goal < 70 Add atorvastatin 40  Continue statin at discharge  Other Stroke Risk Factors Advanced Age >/= 50  Obesity, Body mass index is 31.61 kg/m., BMI >/= 30 associated with increased stroke risk, recommend weight loss, diet and exercise as appropriate   Other Active Problems Covid positive Squamous cell skin cancer GERD Arthritis  Hospital day # 0  Lissy Olivencia-Simmons, ACNP-BC Stroke NP I have personally obtained history,examined this patient, reviewed notes, independently viewed imaging studies, participated in medical decision making and plan of care.ROS completed by me personally and pertinent positives fully documented  I have made any additions or clarifications directly to the above note. Agree with note above.  Patient presented with left-sided vision loss due to right medial occipital infarct of embolic  etiology.  Unclear if this is related to COVID hypercoagulability and more likely it is related to paroxysmal A. fib given his age.  Patient is extremely restless and not willing for me to give him rational explanation and professional opinion about further work-up.  He does not trust doctors and hospitals and would like to go home.  He has had adequate stroke work-up to be discharged but I would recommend patient do not drive till his peripheral vision loss improves and close follow-up with his primary care physician and cardiologist.  I would also strongly encourage consideration for at least 30-day heart monitor to look for paroxysmal A. fib if not loop recorder as an outpatient.  Recommend aspirin and Plavix for 3 weeks followed by aspirin alone and aggressive risk factor modification.  Follow-up with me in the stroke clinic in 8 weeks. Long discussion with patient and with Dr. Wyline Copas and answered questions.  Greater than 50% time during this 35-minute visit was spent on counseling and coordination  of care and discussion about embolic stroke and vision loss and paroxysmal A. fib and answering questions Kenneth Contras, MD Medical Director [place] Pager: [phone] 12/10/2020 4:12 PM  To contact Stroke Continuity provider, please refer to [email or web address] After hours, contact General Neurology

## 2020-12-10 NOTE — Progress Notes (Signed)
SLP Cancellation Note  Patient Details Name: Kenneth Montoya MRN: [id number] DOB: [birth year]   Cancelled treatment:       Reason Eval/Treat Not Completed: SLP screened, no needs identified, will sign off. Per chart review and speaking with PT, patient appears at his baseline and no formal SLP evaluation warranted at this time. Thank you for this referral!   Sonia Baller, MA, [facility] Acute Rehab

## 2020-12-10 NOTE — Discharge Summary (Signed)
Physician Discharge Summary  Kenneth Montoya NTI:[id number] DOB: January 24, 1941 DOA: 12/09/2020  PCP: Janith Lima, MD  Admit date: 12/09/2020 Discharge date: 12/10/2020  Admitted From: Home Disposition:  Home  Recommendations for Outpatient Follow-up:  Follow up with PCP in 1-2 weeks Follow up with Neurology as scheduled  Follow up with outpatient event monitor  Discharge Condition:Stable CODE STATUS:Full Diet recommendation: Heart healthy   Brief/Interim Summary: 80 y.o. male with medical history significant of HTN, chronic hyponatremia, BPH, cataract, chronic RBBB, GERD, presented with vision changes.   Patient woke this morning, felt " dark clouds developed block left-sided vision" denies any headache, no hearing changes no numbness or weakness of any of the limbs.  Yesterday, patient felt very tired, he used to able to walk > 45 minutes, but yesterday he was only able to walk about 15 minutes and feels very tired and retired.  No shortness of breath or chest pains.  He has no history of A. fib, stroke or MI.  Discharge Diagnoses:  Active Problems:   Stroke [facility])   Cerebral infarction ([place])  Right occipital acute CVA -With left lateral visual field loss, improved. -MRI noted acute nonhemorrhagic infarct involving medial R occipital lobe with remote cortical infarct of R post middle frontal gyrus -Seen by Neurology who has since cleared pt for d/c with recommendation for outpatient cardiac monitor -Cardiology to arrange outpatient monitor   HTN, uncontrolled -cont bp meds as tolerated   Chronic hyponatremia -Asymptomatic, outpatient PCP follow-up.   BPH -Continue Flomax  COVID pos -asymptomatic, inflammatory markers are unremarkable -Pt declined monoclonal ab   Discharge Instructions  Discharge Instructions     Ambulatory referral to Neurology   Complete by: As directed    An appointment is requested in approximately: 4 weeks      Allergies as of 12/10/2020    No Known Allergies      Medication List     STOP taking these medications    amoxicillin 500 MG tablet Commonly known as: AMOXIL   hydrOXYzine 10 MG tablet Commonly known as: ATARAX/VISTARIL   naproxen sodium 220 MG tablet Commonly known as: ALEVE   traZODone 50 MG tablet Commonly known as: DESYREL   valACYclovir 1000 MG tablet Commonly known as: VALTREX       TAKE these medications    acetaminophen 500 MG tablet Commonly known as: TYLENOL Take 1,000 mg by mouth in the morning and at bedtime.   amLODipine 10 MG tablet Commonly known as: NORVASC TAKE 1 TABLET BY MOUTH AT  BEDTIME   ascorbic acid 1000 MG tablet Commonly known as: VITAMIN C Take 1,000 mg by mouth at bedtime.   aspirin 81 MG EC tablet Take 1 tablet (81 mg total) by mouth daily. Swallow whole.   atorvastatin 40 MG tablet Commonly known as: LIPITOR Take 1 tablet (40 mg total) by mouth daily.   clopidogrel 75 MG tablet Commonly known as: Plavix Take 1 tablet (75 mg total) by mouth daily for 21 days.   fluticasone 50 MCG/ACT nasal spray Commonly known as: FLONASE Place 1 spray into both nostrils daily.   losartan 100 MG tablet Commonly known as: COZAAR TAKE 1 TABLET BY MOUTH  DAILY   olopatadine 0.1 % ophthalmic solution Commonly known as: PATANOL Place 1 drop into both eyes at bedtime.   omeprazole 40 MG capsule Commonly known as: PRILOSEC TAKE ONE CAPSULE BY MOUTH  DAILY   psyllium 58.6 % powder Commonly known as: METAMUCIL Take 1 packet by mouth  at bedtime.   tamsulosin 0.4 MG Caps capsule Commonly known as: FLOMAX Take 0.4 mg by mouth at bedtime.   Zinc 220 (50 Zn) MG Caps Take 1 capsule by mouth daily for 10 days.        Follow-up Information     Belva Crome, MD Follow up.   Specialty: Cardiology Why: at the request of Neurology, office will mail you a 30day monitor and try to set up a 2 month follow up with Dr. Micah Flesher information: [id number] [phone]         Janith Lima, MD Follow up in 2 week(s).   Specialty: Internal Medicine Contact information: [place] [address] [phone]                No Known Allergies  Consultations: Neurology  Procedures/Studies: MR ANGIO HEAD WO CONTRAST  Result Date: 12/09/2020 CLINICAL DATA:  Follow-up examination for acute stroke. EXAM: MRA HEAD WITHOUT CONTRAST MRA NECK WITHOUT CONTRAST TECHNIQUE: Angiographic images of the Circle of Willis were acquired using MRA technique without intravenous contrast. Angiographic images of the neck were acquired using MRA technique without intravenous contrast. Carotid stenosis measurements (when applicable) are obtained utilizing NASCET criteria, using the distal internal carotid diameter as the denominator. COMPARISON:  Prior brain MRI from earlier the same day. FINDINGS: MRA HEAD FINDINGS Anterior circulation: Visualized distal cervical segments of the internal carotid arteries are widely patent with antegrade flow. Petrous, cavernous, and supraclinoid segments widely patent without stenosis. A1 segments patent bilaterally. Right A1 branches early. Normal anterior communicating artery complex. Anterior cerebral arteries widely patent to their distal aspects without stenosis. No M1 stenosis or occlusion. Normal MCA bifurcations. Distal MCA branches well perfused and symmetric. Posterior circulation: Dominant right vertebral artery widely patent to the vertebrobasilar junction. Left vertebral artery diffusely hypoplastic but patent as well. Right PICA origin patent and normal. Partially visualized left PICA patent. Basilar mildly tortuous but is widely patent to its distal aspect without stenosis. Superior cerebellar arteries patent bilaterally. Right PCA supplied via the basilar. Left PCA supplied via the basilar as well as a robust left posterior communicating artery. Both PCAs  widely patent proximally. Severe distal left P2 and right P3 stenoses noted (series 252, image 19). PCAs otherwise patent to their distal aspects. Anatomic variants: Strongly dominant right vertebral artery with hypoplastic left vertebral artery. Robust left posterior communicating artery. No aneurysm. MRA NECK FINDINGS Aortic arch: Partially visualized aortic arch normal in caliber with normal branch pattern. Atheromatous change noted within the visualized distal arch. No hemodynamically significant stenosis seen about the origin of the great vessels. Right carotid system: Right CCA patent from its origin to the bifurcation without stenosis. Atheromatous plaque about the proximal right ICA with associated short-segment stenosis of up to 40-50% by NASCET criteria (series 4, image 33). Right ICA otherwise patent distally to the skull base. Left carotid system: Left CCA patent from its origin to the bifurcation without stenosis. Mild eccentric plaque about the proximal left ICA with no more than mild 25% stenosis by NASCET criteria. Left ICA patent distally to the skull base. Vertebral arteries: Both vertebral arteries arise from the subclavian arteries. No visible proximal subclavian artery stenosis. Right vertebral artery dominant with a hypoplastic left vertebral artery neither vertebral artery origin well visualized. Visualized vertebral arteries widely patent without stenosis, evidence for dissection, or occlusion. Other: None IMPRESSION: MRA HEAD: 1. Negative intracranial MRA for large vessel  occlusion. 2. Severe distal left P2 and right P3 stenoses. 3. Otherwise wide patency of the major intracranial arterial circulation. No proximal high-grade or correctable stenosis. MRA NECK: 1. Short-segment moderate stenosis of up to 40-50% about the proximal right ICA. 2. No significant stenosis involving the left carotid artery system. 3. Both vertebral arteries widely patent within the neck. Right vertebral artery  dominant. Electronically Signed   By: Jeannine Boga M.D.   On: 12/09/2020 22:29   MR ANGIO NECK WO CONTRAST  Result Date: 12/09/2020 CLINICAL DATA:  Follow-up examination for acute stroke. EXAM: MRA HEAD WITHOUT CONTRAST MRA NECK WITHOUT CONTRAST TECHNIQUE: Angiographic images of the Circle of Willis were acquired using MRA technique without intravenous contrast. Angiographic images of the neck were acquired using MRA technique without intravenous contrast. Carotid stenosis measurements (when applicable) are obtained utilizing NASCET criteria, using the distal internal carotid diameter as the denominator. COMPARISON:  Prior brain MRI from earlier the same day. FINDINGS: MRA HEAD FINDINGS Anterior circulation: Visualized distal cervical segments of the internal carotid arteries are widely patent with antegrade flow. Petrous, cavernous, and supraclinoid segments widely patent without stenosis. A1 segments patent bilaterally. Right A1 branches early. Normal anterior communicating artery complex. Anterior cerebral arteries widely patent to their distal aspects without stenosis. No M1 stenosis or occlusion. Normal MCA bifurcations. Distal MCA branches well perfused and symmetric. Posterior circulation: Dominant right vertebral artery widely patent to the vertebrobasilar junction. Left vertebral artery diffusely hypoplastic but patent as well. Right PICA origin patent and normal. Partially visualized left PICA patent. Basilar mildly tortuous but is widely patent to its distal aspect without stenosis. Superior cerebellar arteries patent bilaterally. Right PCA supplied via the basilar. Left PCA supplied via the basilar as well as a robust left posterior communicating artery. Both PCAs widely patent proximally. Severe distal left P2 and right P3 stenoses noted (series 252, image 19). PCAs otherwise patent to their distal aspects. Anatomic variants: Strongly dominant right vertebral artery with hypoplastic left  vertebral artery. Robust left posterior communicating artery. No aneurysm. MRA NECK FINDINGS Aortic arch: Partially visualized aortic arch normal in caliber with normal branch pattern. Atheromatous change noted within the visualized distal arch. No hemodynamically significant stenosis seen about the origin of the great vessels. Right carotid system: Right CCA patent from its origin to the bifurcation without stenosis. Atheromatous plaque about the proximal right ICA with associated short-segment stenosis of up to 40-50% by NASCET criteria (series 4, image 33). Right ICA otherwise patent distally to the skull base. Left carotid system: Left CCA patent from its origin to the bifurcation without stenosis. Mild eccentric plaque about the proximal left ICA with no more than mild 25% stenosis by NASCET criteria. Left ICA patent distally to the skull base. Vertebral arteries: Both vertebral arteries arise from the subclavian arteries. No visible proximal subclavian artery stenosis. Right vertebral artery dominant with a hypoplastic left vertebral artery neither vertebral artery origin well visualized. Visualized vertebral arteries widely patent without stenosis, evidence for dissection, or occlusion. Other: None IMPRESSION: MRA HEAD: 1. Negative intracranial MRA for large vessel occlusion. 2. Severe distal left P2 and right P3 stenoses. 3. Otherwise wide patency of the major intracranial arterial circulation. No proximal high-grade or correctable stenosis. MRA NECK: 1. Short-segment moderate stenosis of up to 40-50% about the proximal right ICA. 2. No significant stenosis involving the left carotid artery system. 3. Both vertebral arteries widely patent within the neck. Right vertebral artery dominant. Electronically Signed   By: Pincus Badder.D.  On: 12/09/2020 22:29   MR BRAIN WO CONTRAST  Result Date: 12/09/2020 CLINICAL DATA:  Left visual field cut. EXAM: MRI HEAD WITHOUT CONTRAST TECHNIQUE: Multiplanar,  multiecho pulse sequences of the brain and surrounding structures were obtained without intravenous contrast. COMPARISON:  None. FINDINGS: Brain: Acute nonhemorrhagic infarct is present in the medial right occipital lobe, consistent visual field cut. T2 FLAIR signal changes are associated with the infarct. Remote cortical infarct present posterior right middle frontal gyrus. Extensive periventricular and scattered subcortical T2 hyperintensities are present bilaterally. Basal ganglia are intact. Mild white matter changes extend into brainstem. Remote lacunar infarcts are present cerebellum, left greater than right. No acute hemorrhage or mass lesion is present. The ventricles are of normal size. No significant extraaxial fluid collection is present. Vascular: Flow is present in the major intracranial arteries. Skull and upper cervical spine: The craniocervical junction is normal. Upper cervical spine is within normal limits. Marrow signal is unremarkable. Sinuses/Orbits: Diffuse mucosal thickening scattered polypoid lesions are present maxillary sinuses bilaterally. There is scattered opacification of ethmoid air cells bilaterally. Thickening present in the frontal sinuses. The mastoid air cells are clear. Bilateral lens replacements are noted. Globes and orbits are otherwise unremarkable. IMPRESSION: 1. Acute nonhemorrhagic infarct involving the medial right occipital lobe. This corresponds with the visual field cut. 2. Remote cortical infarct of the posterior right middle frontal gyrus. 3. Extensive periventricular and scattered subcortical T2 hyperintensities bilaterally are moderately advanced for age. This likely reflects the sequela of chronic microvascular ischemia. 4. Moderate diffuse sinus disease. These results were called by telephone at the time of interpretation on 12/09/2020 at 5:03 pm to provider DAN FLOYD, who verbally acknowledged these results. Electronically Signed   By: San Morelle M.D.    On: 12/09/2020 17:04   DG CHEST PORT 1 VIEW  Result Date: 12/10/2020 CLINICAL DATA:  80 year old male with right occipital lobe infarct. EXAM: PORTABLE CHEST 1 VIEW COMPARISON:  CT Abdomen and Pelvis [facility] 07/27/2019. FINDINGS: Portable AP semi upright view at 0836 hours. Pulmonary hyperinflation. Coarse lung markings at the lung bases corresponding to areas of paraseptal emphysema and subpleural scarring on the prior CT. Bilateral nipple shadows. Mediastinal contours remain within normal limits. Visualized tracheal air column is within normal limits. No pneumothorax or pulmonary edema. No definite pleural effusion. Bilateral shoulder arthroplasty. No acute osseous abnormality identified. IMPRESSION: Chronic lung disease with hyperinflation. No acute cardiopulmonary abnormality. Electronically Signed   By: Genevie Ann M.D.   On: 12/10/2020 08:57    Subjective: Very eager to go home  Discharge Exam: Vitals:   12/10/20 0752 12/10/20 1148  BP: (!) 158/78 (!) 166/91  Pulse: 84 92  Resp: 17 20  Temp: 97.6 F (36.4 C) 97.7 F (36.5 C)  SpO2:     Vitals:   12/10/20 0000 12/10/20 0329 12/10/20 0752 12/10/20 1148  BP: 133/78 (!) 144/81 (!) 158/78 (!) 166/91  Pulse: 83 68 84 92  Resp: 18 16 17 20   Temp: 98.6 F (37 C) 97.7 F (36.5 C) 97.6 F (36.4 C) 97.7 F (36.5 C)  TempSrc: Oral Oral Oral Oral  SpO2: 100%     Weight:      Height:        General: Pt is alert, awake, not in acute distress Cardiovascular: RRR, S1/S2 + Respiratory: CTA bilaterally, no wheezing, no rhonchi Abdominal: Soft, NT, ND, bowel sounds + Extremities: no edema, no cyanosis   The results of significant diagnostics from this hospitalization (including imaging, microbiology, ancillary  and laboratory) are listed below for reference.     Microbiology: Recent Results (from the past 240 hour(s))  Resp Panel by RT-PCR (Flu A&B, Covid) Nasopharyngeal Swab     Status: Abnormal   Collection Time:  12/09/20  5:40 PM   Specimen: Nasopharyngeal Swab; Nasopharyngeal(NP) swabs in vial transport medium  Result Value Ref Range Status   SARS Coronavirus 2 by RT PCR POSITIVE (A) NEGATIVE Final    Comment: RESULT CALLED TO, READ BACK BY AND VERIFIED WITH: RN K.OWENS ON [id number] AT 1918 BY E.PARRISH (NOTE) SARS-CoV-2 target nucleic acids are DETECTED.  The SARS-CoV-2 RNA is generally detectable in upper respiratory specimens during the acute phase of infection. Positive results are indicative of the presence of the identified virus, but do not rule out bacterial infection or co-infection with other pathogens not detected by the test. Clinical correlation with patient history and other diagnostic information is necessary to determine patient infection status. The expected result is Negative.  Fact Sheet for Patients: EntrepreneurPulse.com.au  Fact Sheet for Healthcare Providers: IncredibleEmployment.be  This test is not yet approved or cleared by the [place] FDA and  has been authorized for detection and/or diagnosis of SARS-CoV-2 by FDA under an Emergency Use Authorization (EUA).  This EUA will remain in effect (meaning this t est can be used) for the duration of  the COVID-19 declaration under Section 564(b)(1) of the Act, 21 U.S.C. section 360bbb-3(b)(1), unless the authorization is terminated or revoked sooner.     Influenza A by PCR NEGATIVE NEGATIVE Final   Influenza B by PCR NEGATIVE NEGATIVE Final    Comment: (NOTE) The Xpert Xpress SARS-CoV-2/FLU/RSV plus assay is intended as an aid in the diagnosis of influenza from Nasopharyngeal swab specimens and should not be used as a sole basis for treatment. Nasal washings and aspirates are unacceptable for Xpert Xpress SARS-CoV-2/FLU/RSV testing.  Fact Sheet for Patients: EntrepreneurPulse.com.au  Fact Sheet for Healthcare  Providers: IncredibleEmployment.be  This test is not yet approved or cleared by the [place] FDA and has been authorized for detection and/or diagnosis of SARS-CoV-2 by FDA under an Emergency Use Authorization (EUA). This EUA will remain in effect (meaning this test can be used) for the duration of the COVID-19 declaration under Section 564(b)(1) of the Act, 21 U.S.C. section 360bbb-3(b)(1), unless the authorization is terminated or revoked.  Performed at [facility], [place] [address], [place], [place] [address]      Labs: BNP (last 3 results) Recent Labs    12/09/20 2226  BNP 27.7   Basic Metabolic Panel: Recent Labs  Lab 12/09/20 1207 12/09/20 1214 12/10/20 0515  NA 130* 130* 130*  K 4.5 4.5 4.1  CL 97* 97* 97*  CO2 25  --  26  GLUCOSE 75 73 129*  BUN 18 20 18   CREATININE 1.00 1.00 0.97  CALCIUM 9.2  --  8.9  MG  --   --  2.1  PHOS  --   --  4.5   Liver Function Tests: Recent Labs  Lab 12/09/20 1207 12/10/20 0515  AST 23 20  ALT 18 14  ALKPHOS 79 61  BILITOT 0.8 0.9  PROT 7.3 6.2*  ALBUMIN 4.5 3.7   No results for input(s): LIPASE, AMYLASE in the last 168 hours. No results for input(s): AMMONIA in the last 168 hours. CBC: Recent Labs  Lab 12/09/20 1207 12/09/20 1214 12/10/20 0515  WBC 8.1  --  8.8  NEUTROABS 5.7  --  6.4  HGB  13.6 14.3 12.9*  HCT 39.4 42.0 37.4*  MCV 92.7  --  92.3  PLT 213  --  209   Cardiac Enzymes: No results for input(s): CKTOTAL, CKMB, CKMBINDEX, TROPONINI in the last 168 hours. BNP: Invalid input(s): POCBNP CBG: Recent Labs  Lab 12/09/20 1232  GLUCAP 91   D-Dimer Recent Labs    12/09/20 2226 12/10/20 0515  DDIMER 0.51* 1.32*   Hgb A1c Recent Labs    12/10/20 0515  HGBA1C 5.8*   Lipid Profile Recent Labs    12/10/20 0515  CHOL 167  HDL 71  LDLCALC 86  TRIG 50  CHOLHDL 2.4   Thyroid function studies No results for input(s): TSH, T4TOTAL, T3FREE, THYROIDAB in the last  72 hours.  Invalid input(s): FREET3 Anemia work up Recent Labs    12/09/20 2226 12/10/20 0515  FERRITIN 38 30   Urinalysis No results found for: COLORURINE, APPEARANCEUR, LABSPEC, [place], GLUCOSEU, [place], [place], [place], PROTEINUR, UROBILINOGEN, NITRITE, LEUKOCYTESUR Sepsis Labs Invalid input(s): PROCALCITONIN,  WBC,  LACTICIDVEN Microbiology Recent Results (from the past 240 hour(s))  Resp Panel by RT-PCR (Flu A&B, Covid) Nasopharyngeal Swab     Status: Abnormal   Collection Time: 12/09/20  5:40 PM   Specimen: Nasopharyngeal Swab; Nasopharyngeal(NP) swabs in vial transport medium  Result Value Ref Range Status   SARS Coronavirus 2 by RT PCR POSITIVE (A) NEGATIVE Final    Comment: RESULT CALLED TO, READ BACK BY AND VERIFIED WITH: RN K.OWENS ON [id number] AT 1918 BY E.PARRISH (NOTE) SARS-CoV-2 target nucleic acids are DETECTED.  The SARS-CoV-2 RNA is generally detectable in upper respiratory specimens during the acute phase of infection. Positive results are indicative of the presence of the identified virus, but do not rule out bacterial infection or co-infection with other pathogens not detected by the test. Clinical correlation with patient history and other diagnostic information is necessary to determine patient infection status. The expected result is Negative.  Fact Sheet for Patients: EntrepreneurPulse.com.au  Fact Sheet for Healthcare Providers: IncredibleEmployment.be  This test is not yet approved or cleared by the [place] FDA and  has been authorized for detection and/or diagnosis of SARS-CoV-2 by FDA under an Emergency Use Authorization (EUA).  This EUA will remain in effect (meaning this t est can be used) for the duration of  the COVID-19 declaration under Section 564(b)(1) of the Act, 21 U.S.C. section 360bbb-3(b)(1), unless the authorization is terminated or revoked sooner.     Influenza A by PCR NEGATIVE  NEGATIVE Final   Influenza B by PCR NEGATIVE NEGATIVE Final    Comment: (NOTE) The Xpert Xpress SARS-CoV-2/FLU/RSV plus assay is intended as an aid in the diagnosis of influenza from Nasopharyngeal swab specimens and should not be used as a sole basis for treatment. Nasal washings and aspirates are unacceptable for Xpert Xpress SARS-CoV-2/FLU/RSV testing.  Fact Sheet for Patients: EntrepreneurPulse.com.au  Fact Sheet for Healthcare Providers: IncredibleEmployment.be  This test is not yet approved or cleared by the [place] FDA and has been authorized for detection and/or diagnosis of SARS-CoV-2 by FDA under an Emergency Use Authorization (EUA). This EUA will remain in effect (meaning this test can be used) for the duration of the COVID-19 declaration under Section 564(b)(1) of the Act, 21 U.S.C. section 360bbb-3(b)(1), unless the authorization is terminated or revoked.  Performed at [facility], [place] [address], [place], [place] [address]    Time spent: 30 min  SIGNED:   Marylu Lund, MD  [facility] 12/10/2020, 5:21 PM  If 7PM-7AM, please contact night-coverage

## 2020-12-11 ENCOUNTER — Encounter: Payer: Self-pay | Admitting: *Deleted

## 2020-12-11 ENCOUNTER — Telehealth: Payer: Self-pay | Admitting: Interventional Cardiology

## 2020-12-11 NOTE — Telephone Encounter (Signed)
Spoke with pt and he was very apologetic for the way he spoke with the operator.  States he is just very scared by everything that happened and states he had a terrible experience at the hospital.  Advised pt of the importance of wearing the monitor and coming in for an appt.  Pt agreeable to both.  Scheduled him to come into the office in September and reviewed monitor instructions briefly.  Pt aware if any issues when he receives the monitor, to call the number on the top of the box.  Pt very appreciative for call.

## 2020-12-11 NOTE — Telephone Encounter (Signed)
May have been disconnected when patient took another call from [facility].  LMVM with direct line telephone number [phone].

## 2020-12-11 NOTE — Telephone Encounter (Signed)
Called pt to schedule 2 month follow up with Dr. Tamala Julian or an APP per staff message following the pt's heart monitor that was sent out today. Pt had no idea about the heart monitor and stated that he did not want to wear it. He stated that this whole process has been nothing but "treatment, treatment, treatment" and he has not been cared for. He stated that, "old people want to be cared for and not just treated." Stated he would call back and then hung up. Just FYI that pt does not want heart monitor.

## 2020-12-11 NOTE — Progress Notes (Signed)
Patient ID: Kenneth Montoya, male   DOB: Sep 10, 1940, 80 y.o.   MRN: [id number] Patient enrolled for [employer] to ship a 30 day cardiac event monitor to address on file. Letter with instructions mailed to patient.

## 2020-12-12 ENCOUNTER — Ambulatory Visit: Payer: Medicare Other | Admitting: Allergy & Immunology

## 2020-12-14 ENCOUNTER — Telehealth: Payer: Self-pay | Admitting: Interventional Cardiology

## 2020-12-14 NOTE — Telephone Encounter (Signed)
Pt is calling to voice his concerns about his hospital stay and wants to find out his results for the testing he had done

## 2020-12-14 NOTE — Telephone Encounter (Signed)
Returned call to Pt.  Answered questions regarding aftercare following his stroke.  Advised he would be wearing a 30 day monitor to evaluate for afib.  Pt states if he cannot figure out the heart monitor he will call back to schedule a time to have monitor placed.  Pt thanked nurse for call back.

## 2020-12-15 ENCOUNTER — Encounter: Payer: Self-pay | Admitting: Internal Medicine

## 2020-12-15 ENCOUNTER — Ambulatory Visit (INDEPENDENT_AMBULATORY_CARE_PROVIDER_SITE_OTHER): Payer: Medicare Other | Admitting: Internal Medicine

## 2020-12-15 ENCOUNTER — Other Ambulatory Visit (INDEPENDENT_AMBULATORY_CARE_PROVIDER_SITE_OTHER): Payer: Medicare Other

## 2020-12-15 ENCOUNTER — Other Ambulatory Visit: Payer: Self-pay

## 2020-12-15 VITALS — BP 140/70 | HR 70 | Temp 98.1°F | Ht 69.0 in | Wt 191.0 lb

## 2020-12-15 DIAGNOSIS — I6300 Cerebral infarction due to thrombosis of unspecified precerebral artery: Secondary | ICD-10-CM

## 2020-12-15 DIAGNOSIS — N401 Enlarged prostate with lower urinary tract symptoms: Secondary | ICD-10-CM

## 2020-12-15 DIAGNOSIS — E785 Hyperlipidemia, unspecified: Secondary | ICD-10-CM

## 2020-12-15 DIAGNOSIS — R3912 Poor urinary stream: Secondary | ICD-10-CM | POA: Diagnosis not present

## 2020-12-15 DIAGNOSIS — I10 Essential (primary) hypertension: Secondary | ICD-10-CM | POA: Diagnosis not present

## 2020-12-15 DIAGNOSIS — N320 Bladder-neck obstruction: Secondary | ICD-10-CM

## 2020-12-15 DIAGNOSIS — K219 Gastro-esophageal reflux disease without esophagitis: Secondary | ICD-10-CM | POA: Diagnosis not present

## 2020-12-15 LAB — CBC WITH DIFFERENTIAL/PLATELET
Basophils Absolute: 0.1 10*3/uL (ref 0.0–0.1)
Basophils Relative: 1 % (ref 0.0–3.0)
Eosinophils Absolute: 0.3 10*3/uL (ref 0.0–0.7)
Eosinophils Relative: 3.8 % (ref 0.0–5.0)
HCT: 37.8 % — ABNORMAL LOW (ref 39.0–52.0)
Hemoglobin: 12.9 g/dL — ABNORMAL LOW (ref 13.0–17.0)
Lymphocytes Relative: 20.7 % (ref 12.0–46.0)
Lymphs Abs: 1.7 10*3/uL (ref 0.7–4.0)
MCHC: 34.1 g/dL (ref 30.0–36.0)
MCV: 93.6 fl (ref 78.0–100.0)
Monocytes Absolute: 0.7 10*3/uL (ref 0.1–1.0)
Monocytes Relative: 8.7 % (ref 3.0–12.0)
Neutro Abs: 5.4 10*3/uL (ref 1.4–7.7)
Neutrophils Relative %: 65.8 % (ref 43.0–77.0)
Platelets: 225 10*3/uL (ref 150.0–400.0)
RBC: 4.04 Mil/uL — ABNORMAL LOW (ref 4.22–5.81)
RDW: 13.9 % (ref 11.5–15.5)
WBC: 8.2 10*3/uL (ref 4.0–10.5)

## 2020-12-15 LAB — URINALYSIS, ROUTINE W REFLEX MICROSCOPIC
Bilirubin Urine: NEGATIVE
Hgb urine dipstick: NEGATIVE
Ketones, ur: NEGATIVE
Leukocytes,Ua: NEGATIVE
Nitrite: NEGATIVE
RBC / HPF: NONE SEEN (ref 0–?)
Specific Gravity, Urine: 1.01 (ref 1.000–1.030)
Total Protein, Urine: NEGATIVE
Urine Glucose: NEGATIVE
Urobilinogen, UA: 0.2 (ref 0.0–1.0)
WBC, UA: NONE SEEN (ref 0–?)
pH: 7 (ref 5.0–8.0)

## 2020-12-15 LAB — HEPATIC FUNCTION PANEL
ALT: 14 U/L (ref 0–53)
AST: 17 U/L (ref 0–37)
Albumin: 4.4 g/dL (ref 3.5–5.2)
Alkaline Phosphatase: 83 U/L (ref 39–117)
Bilirubin, Direct: 0.2 mg/dL (ref 0.0–0.3)
Total Bilirubin: 0.8 mg/dL (ref 0.2–1.2)
Total Protein: 6.6 g/dL (ref 6.0–8.3)

## 2020-12-15 LAB — LIPID PANEL
Cholesterol: 132 mg/dL (ref 0–200)
HDL: 73.8 mg/dL (ref >39.00)
LDL Cholesterol: 50 mg/dL (ref 0–99)
NonHDL: 58.38
Total CHOL/HDL Ratio: 2
Triglycerides: 40 mg/dL (ref 0.0–149.0)
VLDL: 8 mg/dL (ref 0.0–40.0)

## 2020-12-15 LAB — BASIC METABOLIC PANEL
BUN: 21 mg/dL (ref 6–23)
CO2: 26 mEq/L (ref 19–32)
Calcium: 9 mg/dL (ref 8.4–10.5)
Chloride: 96 mEq/L (ref 96–112)
Creatinine, Ser: 1.07 mg/dL (ref 0.40–1.50)
GFR: 65.18 mL/min (ref >60.00)
Glucose, Bld: 93 mg/dL (ref 70–99)
Potassium: 4.4 mEq/L (ref 3.5–5.1)
Sodium: 131 mEq/L — ABNORMAL LOW (ref 135–145)

## 2020-12-15 LAB — TSH: TSH: 4.89 u[IU]/mL (ref 0.35–5.50)

## 2020-12-15 LAB — PSA: PSA: 2.5 ng/mL (ref 0.10–4.00)

## 2020-12-15 NOTE — Progress Notes (Signed)
Subjective:  Patient ID: Kenneth Montoya, male    DOB: February 18, 1941  Age: 80 y.o. MRN: [id number]  CC: Follow-up  This visit occurred during the SARS-CoV-2 public health emergency.  Safety protocols were in place, including screening questions prior to the visit, additional usage of staff PPE, and extensive cleaning of exam room while observing appropriate contact time as indicated for disinfecting solutions.    HPI LAFRANCE Montoya presents for f/up -  1 week ago he developed a left visual field cut.  He was seen by a ophthalmologist and there was concern for CVA so he was referred to the ED.  His MRI was + for CVA.  He is on aggressive risk factor modifications and will follow up with a neurologist soon.  He tells me his vision is slowly returning to normal.  He denies headache, paresthesias, or ataxia.   PCP: Janith Lima, MD   Admit date: 12/09/2020 Discharge date: 12/10/2020   Admitted From: Home Disposition:  Home   Recommendations for Outpatient Follow-up:  Follow up with PCP in 1-2 weeks Follow up with Neurology as scheduled Follow up with outpatient event monitor   Discharge Condition:Stable CODE STATUS:Full Diet recommendation: Heart healthy    Brief/Interim Summary: 80 y.o. male with medical history significant of HTN, chronic hyponatremia, BPH, cataract, chronic RBBB, GERD, presented with vision changes.   Patient woke this morning, felt " dark clouds developed block left-sided vision" denies any headache, no hearing changes no numbness or weakness of any of the limbs.  Yesterday, patient felt very tired, he used to able to walk > 45 minutes, but yesterday he was only able to walk about 15 minutes and feels very tired and retired.  No shortness of breath or chest pains.  He has no history of A. fib, stroke or MI.   Discharge Diagnoses:  Active Problems:   Stroke [facility])   Cerebral infarction ([place])   Right occipital acute CVA -With left lateral visual field loss,  improved. -MRI noted acute nonhemorrhagic infarct involving medial R occipital lobe with remote cortical infarct of R post middle frontal gyrus -Seen by Neurology who has since cleared pt for d/c with recommendation for outpatient cardiac monitor -Cardiology to arrange outpatient monitor   HTN, uncontrolled -cont bp meds as tolerated   Chronic hyponatremia -Asymptomatic, outpatient PCP follow-up.   BPH -Continue Flomax  Outpatient Medications Prior to Visit  Medication Sig Dispense Refill   acetaminophen (TYLENOL) 500 MG tablet Take 1,000 mg by mouth in the morning and at bedtime.     amLODipine (NORVASC) 10 MG tablet TAKE 1 TABLET BY MOUTH AT  BEDTIME 90 tablet 1   ascorbic acid (VITAMIN C) 1000 MG tablet Take 1,000 mg by mouth at bedtime.     aspirin EC 81 MG EC tablet Take 1 tablet (81 mg total) by mouth daily. Swallow whole. 30 tablet 11   atorvastatin (LIPITOR) 40 MG tablet Take 1 tablet (40 mg total) by mouth daily. 30 tablet 2   clopidogrel (PLAVIX) 75 MG tablet Take 1 tablet (75 mg total) by mouth daily for 21 days. 21 tablet 0   fluticasone (FLONASE) 50 MCG/ACT nasal spray Place 1 spray into both nostrils daily. 48 g 1   losartan (COZAAR) 100 MG tablet TAKE 1 TABLET BY MOUTH  DAILY 90 tablet 1   olopatadine (PATANOL) 0.1 % ophthalmic solution Place 1 drop into both eyes at bedtime.     omeprazole (PRILOSEC) 40 MG capsule TAKE ONE CAPSULE  BY MOUTH  DAILY 90 capsule 1   psyllium (METAMUCIL) 58.6 % powder Take 1 packet by mouth at bedtime.     tamsulosin (FLOMAX) 0.4 MG CAPS capsule Take 0.4 mg by mouth at bedtime.     Zinc 220 (50 Zn) MG CAPS Take 1 capsule by mouth daily for 10 days. 10 capsule 0   No facility-administered medications prior to visit.    ROS Review of Systems  Constitutional:  Negative for diaphoresis and fatigue.  HENT: Negative.    Eyes:  Positive for visual disturbance. Negative for photophobia.  Respiratory:  Negative for cough, shortness of breath  and wheezing.   Cardiovascular:  Negative for chest pain, palpitations and leg swelling.  Gastrointestinal:  Negative for anal bleeding, diarrhea and nausea.  Endocrine: Negative.   Genitourinary: Negative.   Musculoskeletal: Negative.   Skin: Negative.   Neurological:  Negative for dizziness, weakness, light-headedness, numbness and headaches.  Hematological:  Negative for adenopathy. Does not bruise/bleed easily.  Psychiatric/Behavioral: Negative.     Objective:  BP 140/70 (BP Location: Right Arm, Patient Position: Sitting, Cuff Size: Large)   Pulse 70   Temp 98.1 F (36.7 C) (Oral)   Ht '5\' 9"'$  (1.753 m)   Wt 191 lb (86.6 kg)   SpO2 99%   BMI 28.21 kg/m   BP Readings from Last 3 Encounters:  12/15/20 140/70  12/10/20 (!) 166/91  11/21/20 118/70    Wt Readings from Last 3 Encounters:  12/15/20 191 lb (86.6 kg)  12/09/20 214 lb 0.8 oz (97.1 kg)  11/21/20 192 lb (87.1 kg)    Physical Exam Vitals reviewed.  Constitutional:      Appearance: Normal appearance.  HENT:     Mouth/Throat:     Mouth: Mucous membranes are moist.  Eyes:     General: No scleral icterus.    Conjunctiva/sclera: Conjunctivae normal.  Cardiovascular:     Rate and Rhythm: Normal rate and regular rhythm.     Heart sounds: No murmur heard. Pulmonary:     Effort: Pulmonary effort is normal.     Breath sounds: No wheezing, rhonchi or rales.  Abdominal:     General: Abdomen is flat.     Palpations: There is no mass.     Tenderness: There is no abdominal tenderness. There is no guarding.  Musculoskeletal:        General: Normal range of motion.     Cervical back: Neck supple.     Right lower leg: No edema.     Left lower leg: No edema.  Lymphadenopathy:     Cervical: No cervical adenopathy.  Skin:    General: Skin is warm and dry.  Neurological:     General: No focal deficit present.     Mental Status: He is alert and oriented to person, place, and time. Mental status is at baseline.      Motor: No weakness.     Coordination: Coordination normal.     Gait: Gait normal.     Deep Tendon Reflexes: Reflexes normal.  Psychiatric:        Mood and Affect: Mood normal.    Lab Results  Component Value Date   WBC 8.2 12/15/2020   HGB 12.9 (L) 12/15/2020   HCT 37.8 (L) 12/15/2020   PLT 225.0 12/15/2020   GLUCOSE 93 12/15/2020   CHOL 132 12/15/2020   TRIG 40.0 12/15/2020   HDL 73.80 12/15/2020   LDLCALC 50 12/15/2020   ALT 14 12/15/2020   AST  17 12/15/2020   NA 131 (L) 12/15/2020   K 4.4 12/15/2020   CL 96 12/15/2020   CREATININE 1.07 12/15/2020   BUN 21 12/15/2020   CO2 26 12/15/2020   TSH 4.89 12/15/2020   PSA 2.50 12/15/2020   INR 1.0 12/09/2020   HGBA1C 5.8 (H) 12/10/2020    MR ANGIO HEAD WO CONTRAST  Result Date: 12/09/2020 CLINICAL DATA:  Follow-up examination for acute stroke. EXAM: MRA HEAD WITHOUT CONTRAST MRA NECK WITHOUT CONTRAST TECHNIQUE: Angiographic images of the Circle of Willis were acquired using MRA technique without intravenous contrast. Angiographic images of the neck were acquired using MRA technique without intravenous contrast. Carotid stenosis measurements (when applicable) are obtained utilizing NASCET criteria, using the distal internal carotid diameter as the denominator. COMPARISON:  Prior brain MRI from earlier the same day. FINDINGS: MRA HEAD FINDINGS Anterior circulation: Visualized distal cervical segments of the internal carotid arteries are widely patent with antegrade flow. Petrous, cavernous, and supraclinoid segments widely patent without stenosis. A1 segments patent bilaterally. Right A1 branches early. Normal anterior communicating artery complex. Anterior cerebral arteries widely patent to their distal aspects without stenosis. No M1 stenosis or occlusion. Normal MCA bifurcations. Distal MCA branches well perfused and symmetric. Posterior circulation: Dominant right vertebral artery widely patent to the vertebrobasilar junction. Left  vertebral artery diffusely hypoplastic but patent as well. Right PICA origin patent and normal. Partially visualized left PICA patent. Basilar mildly tortuous but is widely patent to its distal aspect without stenosis. Superior cerebellar arteries patent bilaterally. Right PCA supplied via the basilar. Left PCA supplied via the basilar as well as a robust left posterior communicating artery. Both PCAs widely patent proximally. Severe distal left P2 and right P3 stenoses noted (series 252, image 19). PCAs otherwise patent to their distal aspects. Anatomic variants: Strongly dominant right vertebral artery with hypoplastic left vertebral artery. Robust left posterior communicating artery. No aneurysm. MRA NECK FINDINGS Aortic arch: Partially visualized aortic arch normal in caliber with normal branch pattern. Atheromatous change noted within the visualized distal arch. No hemodynamically significant stenosis seen about the origin of the great vessels. Right carotid system: Right CCA patent from its origin to the bifurcation without stenosis. Atheromatous plaque about the proximal right ICA with associated short-segment stenosis of up to 40-50% by NASCET criteria (series 4, image 33). Right ICA otherwise patent distally to the skull base. Left carotid system: Left CCA patent from its origin to the bifurcation without stenosis. Mild eccentric plaque about the proximal left ICA with no more than mild 25% stenosis by NASCET criteria. Left ICA patent distally to the skull base. Vertebral arteries: Both vertebral arteries arise from the subclavian arteries. No visible proximal subclavian artery stenosis. Right vertebral artery dominant with a hypoplastic left vertebral artery neither vertebral artery origin well visualized. Visualized vertebral arteries widely patent without stenosis, evidence for dissection, or occlusion. Other: None IMPRESSION: MRA HEAD: 1. Negative intracranial MRA for large vessel occlusion. 2. Severe  distal left P2 and right P3 stenoses. 3. Otherwise wide patency of the major intracranial arterial circulation. No proximal high-grade or correctable stenosis. MRA NECK: 1. Short-segment moderate stenosis of up to 40-50% about the proximal right ICA. 2. No significant stenosis involving the left carotid artery system. 3. Both vertebral arteries widely patent within the neck. Right vertebral artery dominant. Electronically Signed   By: Jeannine Boga M.D.   On: 12/09/2020 22:29   MR ANGIO NECK WO CONTRAST  Result Date: 12/09/2020 CLINICAL DATA:  Follow-up examination for acute  stroke. EXAM: MRA HEAD WITHOUT CONTRAST MRA NECK WITHOUT CONTRAST TECHNIQUE: Angiographic images of the Circle of Willis were acquired using MRA technique without intravenous contrast. Angiographic images of the neck were acquired using MRA technique without intravenous contrast. Carotid stenosis measurements (when applicable) are obtained utilizing NASCET criteria, using the distal internal carotid diameter as the denominator. COMPARISON:  Prior brain MRI from earlier the same day. FINDINGS: MRA HEAD FINDINGS Anterior circulation: Visualized distal cervical segments of the internal carotid arteries are widely patent with antegrade flow. Petrous, cavernous, and supraclinoid segments widely patent without stenosis. A1 segments patent bilaterally. Right A1 branches early. Normal anterior communicating artery complex. Anterior cerebral arteries widely patent to their distal aspects without stenosis. No M1 stenosis or occlusion. Normal MCA bifurcations. Distal MCA branches well perfused and symmetric. Posterior circulation: Dominant right vertebral artery widely patent to the vertebrobasilar junction. Left vertebral artery diffusely hypoplastic but patent as well. Right PICA origin patent and normal. Partially visualized left PICA patent. Basilar mildly tortuous but is widely patent to its distal aspect without stenosis. Superior  cerebellar arteries patent bilaterally. Right PCA supplied via the basilar. Left PCA supplied via the basilar as well as a robust left posterior communicating artery. Both PCAs widely patent proximally. Severe distal left P2 and right P3 stenoses noted (series 252, image 19). PCAs otherwise patent to their distal aspects. Anatomic variants: Strongly dominant right vertebral artery with hypoplastic left vertebral artery. Robust left posterior communicating artery. No aneurysm. MRA NECK FINDINGS Aortic arch: Partially visualized aortic arch normal in caliber with normal branch pattern. Atheromatous change noted within the visualized distal arch. No hemodynamically significant stenosis seen about the origin of the great vessels. Right carotid system: Right CCA patent from its origin to the bifurcation without stenosis. Atheromatous plaque about the proximal right ICA with associated short-segment stenosis of up to 40-50% by NASCET criteria (series 4, image 33). Right ICA otherwise patent distally to the skull base. Left carotid system: Left CCA patent from its origin to the bifurcation without stenosis. Mild eccentric plaque about the proximal left ICA with no more than mild 25% stenosis by NASCET criteria. Left ICA patent distally to the skull base. Vertebral arteries: Both vertebral arteries arise from the subclavian arteries. No visible proximal subclavian artery stenosis. Right vertebral artery dominant with a hypoplastic left vertebral artery neither vertebral artery origin well visualized. Visualized vertebral arteries widely patent without stenosis, evidence for dissection, or occlusion. Other: None IMPRESSION: MRA HEAD: 1. Negative intracranial MRA for large vessel occlusion. 2. Severe distal left P2 and right P3 stenoses. 3. Otherwise wide patency of the major intracranial arterial circulation. No proximal high-grade or correctable stenosis. MRA NECK: 1. Short-segment moderate stenosis of up to 40-50% about  the proximal right ICA. 2. No significant stenosis involving the left carotid artery system. 3. Both vertebral arteries widely patent within the neck. Right vertebral artery dominant. Electronically Signed   By: Jeannine Boga M.D.   On: 12/09/2020 22:29   MR BRAIN WO CONTRAST  Result Date: 12/09/2020 CLINICAL DATA:  Left visual field cut. EXAM: MRI HEAD WITHOUT CONTRAST TECHNIQUE: Multiplanar, multiecho pulse sequences of the brain and surrounding structures were obtained without intravenous contrast. COMPARISON:  None. FINDINGS: Brain: Acute nonhemorrhagic infarct is present in the medial right occipital lobe, consistent visual field cut. T2 FLAIR signal changes are associated with the infarct. Remote cortical infarct present posterior right middle frontal gyrus. Extensive periventricular and scattered subcortical T2 hyperintensities are present bilaterally. Basal ganglia are intact. Mild  white matter changes extend into brainstem. Remote lacunar infarcts are present cerebellum, left greater than right. No acute hemorrhage or mass lesion is present. The ventricles are of normal size. No significant extraaxial fluid collection is present. Vascular: Flow is present in the major intracranial arteries. Skull and upper cervical spine: The craniocervical junction is normal. Upper cervical spine is within normal limits. Marrow signal is unremarkable. Sinuses/Orbits: Diffuse mucosal thickening scattered polypoid lesions are present maxillary sinuses bilaterally. There is scattered opacification of ethmoid air cells bilaterally. Thickening present in the frontal sinuses. The mastoid air cells are clear. Bilateral lens replacements are noted. Globes and orbits are otherwise unremarkable. IMPRESSION: 1. Acute nonhemorrhagic infarct involving the medial right occipital lobe. This corresponds with the visual field cut. 2. Remote cortical infarct of the posterior right middle frontal gyrus. 3. Extensive  periventricular and scattered subcortical T2 hyperintensities bilaterally are moderately advanced for age. This likely reflects the sequela of chronic microvascular ischemia. 4. Moderate diffuse sinus disease. These results were called by telephone at the time of interpretation on 12/09/2020 at 5:03 pm to provider DAN FLOYD, who verbally acknowledged these results. Electronically Signed   By: San Morelle M.D.   On: 12/09/2020 17:04   DG CHEST PORT 1 VIEW  Result Date: 12/10/2020 CLINICAL DATA:  80 year old male with right occipital lobe infarct. EXAM: PORTABLE CHEST 1 VIEW COMPARISON:  CT Abdomen and Pelvis [facility] 07/27/2019. FINDINGS: Portable AP semi upright view at 0836 hours. Pulmonary hyperinflation. Coarse lung markings at the lung bases corresponding to areas of paraseptal emphysema and subpleural scarring on the prior CT. Bilateral nipple shadows. Mediastinal contours remain within normal limits. Visualized tracheal air column is within normal limits. No pneumothorax or pulmonary edema. No definite pleural effusion. Bilateral shoulder arthroplasty. No acute osseous abnormality identified. IMPRESSION: Chronic lung disease with hyperinflation. No acute cardiopulmonary abnormality. Electronically Signed   By: Genevie Ann M.D.   On: 12/10/2020 08:57    Assessment & Plan:   Jaylen was seen today for follow-up.  Diagnoses and all orders for this visit:  Cerebral infarction due to thrombosis of precerebral artery ([place])- Improvement noted.  Risk factor modifications have been addressed.  He is scheduled for an event monitor to screen for A. fib.  He has a follow-up with neurology.  Essential hypertension- His blood pressure is adequately well controlled.  Hyperlipidemia with target LDL less than 100- LDL goal achieved. Doing well on the statin   I am having Gwyndolyn Saxon B. Friedly "BILL" maintain his ascorbic acid, acetaminophen, tamsulosin, psyllium, fluticasone, omeprazole,  losartan, amLODipine, olopatadine, aspirin, Zinc, clopidogrel, and atorvastatin.  No orders of the defined types were placed in this encounter.    Follow-up: No follow-ups on file.  Scarlette Calico, MD

## 2020-12-17 ENCOUNTER — Encounter: Payer: Self-pay | Admitting: Internal Medicine

## 2020-12-17 DIAGNOSIS — E785 Hyperlipidemia, unspecified: Secondary | ICD-10-CM | POA: Insufficient documentation

## 2020-12-20 ENCOUNTER — Encounter: Payer: Self-pay | Admitting: *Deleted

## 2020-12-20 NOTE — Progress Notes (Signed)
Patient ID: Kenneth Montoya, male   DOB: 29-Mar-1941, 80 y.o.   MRN: [id number] Patient dropped off note pertaining to 30 day [employer] Cardiac Event Monitor that was shipped to his home.  12/15/2020  " I am not going to participate in the AFib Search Study.  Enclosed is a copy of the receipt from [place] where I dropped off the monitor to return."  Thank you, Ethelle Lyon III [employer] Tracking # [id number]

## 2020-12-27 ENCOUNTER — Telehealth: Payer: Self-pay | Admitting: Internal Medicine

## 2020-12-27 NOTE — Telephone Encounter (Signed)
Patient called in regarding prev stroke  Says he was referred to Garvin Fila, MD @ [facility] [place] [facility]  Patient would like for Dr. Ronnald Ramp to give him referral to be seen by Dr. Ellouise Newer, MD at [facility] instead   Patient would like for referral to be done as soon as possible so he can schedule an appt w/ provider  Req callback [phone]

## 2020-12-28 ENCOUNTER — Telehealth: Payer: Self-pay | Admitting: Internal Medicine

## 2020-12-28 ENCOUNTER — Other Ambulatory Visit: Payer: Self-pay | Admitting: Internal Medicine

## 2020-12-28 DIAGNOSIS — I6300 Cerebral infarction due to thrombosis of unspecified precerebral artery: Secondary | ICD-10-CM

## 2020-12-28 NOTE — Telephone Encounter (Signed)
Called pt, LVM stating the referral has been entered.

## 2020-12-28 NOTE — Telephone Encounter (Signed)
Patient requesting refill for clopidogrel (PLAVIX) 75 MG tablet  Pharmacy [facility] [place] AT [place]  Last appt 12/15/20

## 2020-12-28 NOTE — Telephone Encounter (Signed)
Patient has a neurology referral started but the he states the current doctor on the referral does not specialize in stroke, he would rather see Dr.Jaffe.   Also states that when he talked to neurology, they stated if Dr.Jones feels this is urgent they will be able to see him sooner because he personally does not want to go to [facility] and see that doctor.  Please advise

## 2020-12-29 ENCOUNTER — Other Ambulatory Visit: Payer: Self-pay | Admitting: Internal Medicine

## 2020-12-29 NOTE — Telephone Encounter (Signed)
Patient calling back about refill for Clopidogrel   Patient is asking if he needs to continue taking the medication or if he needs to stop. Prev rx was only written for 21 days until 12/31/20  Please callback [phone]

## 2020-12-29 NOTE — Telephone Encounter (Signed)
Follow up message  Calling for status Patient has 3 doses of Plavix remaining

## 2021-01-01 ENCOUNTER — Inpatient Hospital Stay: Payer: Medicare Other | Admitting: Diagnostic Neuroimaging

## 2021-01-01 NOTE — Telephone Encounter (Signed)
Pt has been informed and expressed understanding.  

## 2021-01-05 ENCOUNTER — Other Ambulatory Visit: Payer: Self-pay | Admitting: Internal Medicine

## 2021-01-05 ENCOUNTER — Other Ambulatory Visit (INDEPENDENT_AMBULATORY_CARE_PROVIDER_SITE_OTHER): Payer: Medicare Other

## 2021-01-05 ENCOUNTER — Other Ambulatory Visit: Payer: Self-pay

## 2021-01-05 DIAGNOSIS — E871 Hypo-osmolality and hyponatremia: Secondary | ICD-10-CM | POA: Insufficient documentation

## 2021-01-05 DIAGNOSIS — I10 Essential (primary) hypertension: Secondary | ICD-10-CM

## 2021-01-05 LAB — BASIC METABOLIC PANEL
BUN: 17 mg/dL (ref 6–23)
CO2: 28 mEq/L (ref 19–32)
Calcium: 8.9 mg/dL (ref 8.4–10.5)
Chloride: 93 mEq/L — ABNORMAL LOW (ref 96–112)
Creatinine, Ser: 0.92 mg/dL (ref 0.40–1.50)
GFR: 78.1 mL/min (ref >60.00)
Glucose, Bld: 91 mg/dL (ref 70–99)
Potassium: 4.5 mEq/L (ref 3.5–5.1)
Sodium: 128 mEq/L — ABNORMAL LOW (ref 135–145)

## 2021-01-05 LAB — CORTISOL: Cortisol, Plasma: 9 ug/dL

## 2021-01-06 LAB — SODIUM, URINE, RANDOM: Sodium, Ur: 41 mmol/L (ref 28–272)

## 2021-01-08 ENCOUNTER — Other Ambulatory Visit: Payer: Self-pay | Admitting: Internal Medicine

## 2021-01-10 ENCOUNTER — Telehealth: Payer: Self-pay | Admitting: Internal Medicine

## 2021-01-10 ENCOUNTER — Other Ambulatory Visit: Payer: Self-pay | Admitting: Internal Medicine

## 2021-01-10 DIAGNOSIS — E222 Syndrome of inappropriate secretion of antidiuretic hormone: Secondary | ICD-10-CM | POA: Insufficient documentation

## 2021-01-10 MED ORDER — DEMECLOCYCLINE HCL 150 MG PO TABS: 150.0000 mg | ORAL_TABLET | Freq: Two times a day (BID) | ORAL | 0 refills | Status: DC

## 2021-01-10 NOTE — Telephone Encounter (Signed)
   Please call patient to discuss sodium. He states he does have headache and dizziness

## 2021-01-13 ENCOUNTER — Other Ambulatory Visit: Payer: Self-pay | Admitting: Internal Medicine

## 2021-01-13 DIAGNOSIS — K219 Gastro-esophageal reflux disease without esophagitis: Secondary | ICD-10-CM

## 2021-01-20 ENCOUNTER — Other Ambulatory Visit: Payer: Self-pay | Admitting: Internal Medicine

## 2021-01-20 DIAGNOSIS — I10 Essential (primary) hypertension: Secondary | ICD-10-CM

## 2021-01-22 ENCOUNTER — Telehealth: Payer: Self-pay | Admitting: Interventional Cardiology

## 2021-01-22 NOTE — Telephone Encounter (Signed)
Spoke with pt and inquired about how high his BP has been running.  He kept saying he doesn't know because he doesn't think his cuff is correct.  He has not been writing down the readings because he doesn't feel they are accurate.  Pt states he just wants to come in to discuss his BP and have his cuff calibrated.  Scheduled pt to come in this Friday.  Advised to monitor BP and record the readings and bring his cuff in to his appt.  Pt agreeable to plan.

## 2021-01-22 NOTE — Telephone Encounter (Signed)
  Pt c/o BP issue: STAT if pt c/o blurred vision, one-sided weakness or slurred speech  1. What are your last 5 BP readings? Consistently high  2. Are you having any other symptoms (ex. Dizziness, headache, blurred vision, passed out)?   3. What is your BP issue?   Pt would like to speak with Dr. Thompson Caul nurse regarding his BP. also, he wanted to schedule an appt to have his BP cuff calibrated to make sure it is accurate

## 2021-01-24 ENCOUNTER — Emergency Department (HOSPITAL_COMMUNITY)
Admission: EM | Admit: 2021-01-24 | Discharge: 2021-01-25 | Disposition: A | Discharge status: Home / Self Care | Payer: Medicare Other | Attending: Emergency Medicine | Admitting: Emergency Medicine

## 2021-01-24 ENCOUNTER — Encounter (HOSPITAL_COMMUNITY): Payer: Self-pay

## 2021-01-24 ENCOUNTER — Other Ambulatory Visit: Payer: Self-pay

## 2021-01-24 DIAGNOSIS — H538 Other visual disturbances: Secondary | ICD-10-CM | POA: Diagnosis present

## 2021-01-24 DIAGNOSIS — Z5321 Procedure and treatment not carried out due to patient leaving prior to being seen by health care provider: Secondary | ICD-10-CM | POA: Diagnosis not present

## 2021-01-24 LAB — CBC WITH DIFFERENTIAL/PLATELET
Abs Immature Granulocytes: 0.04 10*3/uL (ref 0.00–0.07)
Basophils Absolute: 0.1 10*3/uL (ref 0.0–0.1)
Basophils Relative: 1 %
Eosinophils Absolute: 0.4 10*3/uL (ref 0.0–0.5)
Eosinophils Relative: 6 %
HCT: 37.3 % — ABNORMAL LOW (ref 39.0–52.0)
Hemoglobin: 12.7 g/dL — ABNORMAL LOW (ref 13.0–17.0)
Immature Granulocytes: 1 %
Lymphocytes Relative: 24 %
Lymphs Abs: 1.6 10*3/uL (ref 0.7–4.0)
MCH: 31.6 pg (ref 26.0–34.0)
MCHC: 34 g/dL (ref 30.0–36.0)
MCV: 92.8 fL (ref 80.0–100.0)
Monocytes Absolute: 0.6 10*3/uL (ref 0.1–1.0)
Monocytes Relative: 8 %
Neutro Abs: 4.2 10*3/uL (ref 1.7–7.7)
Neutrophils Relative %: 60 %
Platelets: 237 10*3/uL (ref 150–400)
RBC: 4.02 MIL/uL — ABNORMAL LOW (ref 4.22–5.81)
RDW: 12.1 % (ref 11.5–15.5)
WBC: 6.9 10*3/uL (ref 4.0–10.5)
nRBC: 0 % (ref 0.0–0.2)

## 2021-01-24 LAB — COMPREHENSIVE METABOLIC PANEL
ALT: 16 U/L (ref 0–44)
AST: 22 U/L (ref 15–41)
Albumin: 3.9 g/dL (ref 3.5–5.0)
Alkaline Phosphatase: 88 U/L (ref 38–126)
Anion gap: 10 (ref 5–15)
BUN: 18 mg/dL (ref 8–23)
CO2: 25 mmol/L (ref 22–32)
Calcium: 9.1 mg/dL (ref 8.9–10.3)
Chloride: 97 mmol/L — ABNORMAL LOW (ref 98–111)
Creatinine, Ser: 1.28 mg/dL — ABNORMAL HIGH (ref 0.61–1.24)
GFR, Estimated: 56 mL/min — ABNORMAL LOW (ref >60)
Glucose, Bld: 111 mg/dL — ABNORMAL HIGH (ref 70–99)
Potassium: 4.4 mmol/L (ref 3.5–5.1)
Sodium: 132 mmol/L — ABNORMAL LOW (ref 135–145)
Total Bilirubin: 0.7 mg/dL (ref 0.3–1.2)
Total Protein: 6.7 g/dL (ref 6.5–8.1)
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
## 2021-01-24 NOTE — ED Triage Notes (Signed)
To triage per wheelchair c/o change in vision this afternoon - pt states his left eye "was stuck in a certain position for a while and then aftera  few seconds went away".   Denies any one sided weakness, slurring of speech, facial asymmetry. Hx of stroke in July and cataract.

## 2021-01-25 NOTE — ED Notes (Signed)
[facility] staff stated this patient left d/t being hungry.

## 2021-01-25 NOTE — Progress Notes (Signed)
NEUROLOGY CONSULTATION NOTE  [name] MRN: [id number] DOB: January 14, 1941  Referring provider: Scarlette Calico, MD Primary care provider: Scarlette Calico, MD  Reason for consult:  stroke  Assessment/Plan:   Right occipital lobe infarct, likely embolic of unknown source - I don't appreciate visual field defect on gross exam. Hypertension  Secondary stroke prevention: ASA '81mg'$  daily Restart atorvastatin at lower dose - '10mg'$  daily.  LDL goal less than 70.  Repeat lipid panel in 3 months. Hgb A1c goal less than 7 Normotensive blood pressure Agreeable to 30 day cardiac event monitor.  However, he would decline implantable loop recorder at this time. Mediterranean diet Follow up 6 months.    Subjective:  Kenneth Montoya is an 80 year old right-handed male with HTN, OA, SIADH and bilateral knee replacement who presents for stroke.  History supplemented by hospital records.  MRI of brain and MRA of head and neck personally reviewed.  He was admitted to [facility] on 12/09/2020 for stroke. He presented with transient left sided vision loss.  MRI of brain showed acute right medial occipital lobe infarct as well as extensive chronic small vessel ischemic changes including remote cortical infarct of the posterior right middle frontal gyrus.  MRA of head and neck showed severe distal left P2 and right P3 stenoses as well as 40-50% proximal right ICA stenosis.  LDL was 86 and Hgb A1c was 5.8.  He was found to be positive for COVID.  Echocardiogram was never performed as patient desired to be discharged.  He was discharged on dual antiplatelet therapy for 21 days followed by ASA alone as well as started on atorvastatin '40mg'$  daily.  Repeat LDL about a week later was 50.  However, he subsequently discontinued the atorvastatin because he felt dizzy and weak.  Outpatient 30 day cardiac event monitor was ordered but he has declined.  Still has a blind spot.  He has followed up with the eye  doctor.     PAST MEDICAL HISTORY: Past Medical History:  Diagnosis Date   Arthritis    generalized   BPH (benign prostatic hyperplasia)    Cancer of skin, squamous cell    frozen    Cataract    bilateral sx   GERD (gastroesophageal reflux disease)    on meds   Hypertension    on meds   Shingles     PAST SURGICAL HISTORY: Past Surgical History:  Procedure Laterality Date   CATARACT EXTRACTION, BILATERAL     COLONOSCOPY  06/27/2020   COLONOSCOPY  2018   gum graft     INGUINAL HERNIA REPAIR     as infant   LUMBAR LAMINECTOMY  06/2015   POLYPECTOMY  2018   TA x 3   REVERSE TOTAL SHOULDER ARTHROPLASTY Bilateral    TONSILLECTOMY AND ADENOIDECTOMY     age 23    TOTAL KNEE ARTHROPLASTY Bilateral    TOTAL KNEE REVISION Right 12/25/2017   TRANSURETHRAL RESECTION OF PROSTATE     x 2   UPPER GASTROINTESTINAL ENDOSCOPY      MEDICATIONS: Current Outpatient Medications on File Prior to Visit  Medication Sig Dispense Refill   acetaminophen (TYLENOL) 500 MG tablet Take 1,000 mg by mouth in the morning and at bedtime.     amLODipine (NORVASC) 10 MG tablet TAKE 1 TABLET BY MOUTH AT  BEDTIME 90 tablet 1   ascorbic acid (VITAMIN C) 1000 MG tablet Take 1,000 mg by mouth at bedtime.     demeclocycline (DECLOMYCIN)  150 MG tablet Take 1 tablet (150 mg total) by mouth 2 (two) times daily. 180 tablet 0   fluticasone (FLONASE) 50 MCG/ACT nasal spray Place 1 spray into both nostrils daily. 48 g 1   losartan (COZAAR) 100 MG tablet TAKE 1 TABLET BY MOUTH  DAILY 90 tablet 1   olopatadine (PATANOL) 0.1 % ophthalmic solution Place 1 drop into both eyes at bedtime.     omeprazole (PRILOSEC) 40 MG capsule TAKE 1 CAPSULE BY MOUTH  DAILY 90 capsule 1   psyllium (METAMUCIL) 58.6 % powder Take 1 packet by mouth at bedtime.     tamsulosin (FLOMAX) 0.4 MG CAPS capsule Take 0.4 mg by mouth at bedtime.     No current facility-administered medications on file prior to visit.    ALLERGIES: No Known  Allergies  FAMILY HISTORY: Family History  Problem Relation Age of Onset   Colon polyps Mother 77   Colon cancer Neg Hx    Esophageal cancer Neg Hx    Rectal cancer Neg Hx    Stomach cancer Neg Hx     Objective:  Blood pressure 129/67, pulse 72, height '5\' 9"'$  (1.753 m), weight 193 lb (87.5 kg), SpO2 98 %. General: No acute distress.  Patient appears well-groomed.   Head:  Normocephalic/atraumatic Eyes:  fundi examined but not visualized Neck: supple, no paraspinal tenderness, full range of motion Back: No paraspinal tenderness Heart: regular rate and rhythm Lungs: Clear to auscultation bilaterally. Vascular: No carotid bruits. Neurological Exam: Mental status: alert and oriented to person, place, and time, recent and remote memory intact, fund of knowledge intact, attention and concentration intact, speech fluent and not dysarthric, language intact. Cranial nerves: CN I: not tested CN II: pupils equal, round and reactive to light, visual fields intact CN III, IV, VI:  full range of motion, no nystagmus, no ptosis CN V: facial sensation intact. CN VII: upper and lower face symmetric CN VIII: hearing intact CN IX, X: gag intact, uvula midline CN XI: sternocleidomastoid and trapezius muscles intact CN XII: tongue midline Bulk & Tone: normal, no fasciculations. Motor:  muscle strength 5/5 throughout Sensation:  Pinprick, temperature and vibratory sensation intact. Deep Tendon Reflexes:  2+ throughout,  toes downgoing.   Finger to nose testing:  Without dysmetria.   Heel to shin:  Without dysmetria.   Gait:  Normal station and stride.  Romberg negative.    Thank you for allowing me to take part in the care of this patient.  Metta Clines, DO  CC: Scarlette Calico, MD

## 2021-01-26 ENCOUNTER — Ambulatory Visit (INDEPENDENT_AMBULATORY_CARE_PROVIDER_SITE_OTHER): Payer: Medicare Other | Admitting: Neurology

## 2021-01-26 ENCOUNTER — Other Ambulatory Visit: Payer: Self-pay | Admitting: Neurology

## 2021-01-26 ENCOUNTER — Ambulatory Visit (INDEPENDENT_AMBULATORY_CARE_PROVIDER_SITE_OTHER): Payer: Medicare Other | Admitting: Pharmacist

## 2021-01-26 ENCOUNTER — Encounter: Payer: Self-pay | Admitting: Neurology

## 2021-01-26 ENCOUNTER — Other Ambulatory Visit: Payer: Self-pay

## 2021-01-26 VITALS — BP 124/60

## 2021-01-26 VITALS — BP 129/67 | HR 72 | Ht 69.0 in | Wt 193.0 lb

## 2021-01-26 DIAGNOSIS — I10 Essential (primary) hypertension: Secondary | ICD-10-CM | POA: Diagnosis not present

## 2021-01-26 DIAGNOSIS — I63431 Cerebral infarction due to embolism of right posterior cerebral artery: Secondary | ICD-10-CM

## 2021-01-26 DIAGNOSIS — E785 Hyperlipidemia, unspecified: Secondary | ICD-10-CM | POA: Diagnosis not present

## 2021-01-26 DIAGNOSIS — I6300 Cerebral infarction due to thrombosis of unspecified precerebral artery: Secondary | ICD-10-CM | POA: Diagnosis not present

## 2021-01-26 MED ORDER — ATORVASTATIN CALCIUM 10 MG PO TABS: 10.0000 mg | ORAL_TABLET | Freq: Every day | ORAL | 0 refills | Status: DC

## 2021-01-26 NOTE — Patient Instructions (Signed)
Start atorvastatin '10mg'$  daily.  Repeat lipid panel in 3 months Continue aspirin '81mg'$  daily 30 day cardiac monitor Mediterranean diet (see below) Follow up 6 months  Mediterranean Diet A Mediterranean diet refers to food and lifestyle choices that are based on the traditions of countries located on the [employer]. This way of eating has been shown to help prevent certain conditions and improve outcomes for people who have chronic diseases, like kidney disease and heart disease. What are tips for following this plan? Lifestyle Cook and eat meals together with your family, when possible. Drink enough fluid to keep your urine clear or pale yellow. Be physically active every day. This includes: Aerobic exercise like running or swimming. Leisure activities like gardening, walking, or housework. Get 7-8 hours of sleep each night. If recommended by your health care provider, drink red wine in moderation. This means 1 glass a day for nonpregnant women and 2 glasses a day for men. A glass of wine equals 5 oz (150 mL). Reading food labels  Check the serving size of packaged foods. For foods such as rice and pasta, the serving size refers to the amount of cooked product, not dry. Check the total fat in packaged foods. Avoid foods that have saturated fat or trans fats. Check the ingredients list for added sugars, such as corn syrup. Shopping At the grocery store, buy most of your food from the areas near the walls of the store. This includes: Fresh fruits and vegetables (produce). Grains, beans, nuts, and seeds. Some of these may be available in unpackaged forms or large amounts (in bulk). Fresh seafood. Poultry and eggs. Low-fat dairy products. Buy whole ingredients instead of prepackaged foods. Buy fresh fruits and vegetables in-season from local farmers markets. Buy frozen fruits and vegetables in resealable bags. If you do not have access to quality fresh seafood, buy precooked frozen  shrimp or canned fish, such as tuna, salmon, or sardines. Buy small amounts of raw or cooked vegetables, salads, or olives from the deli or salad bar at your store. Stock your pantry so you always have certain foods on hand, such as olive oil, canned tuna, canned tomatoes, rice, pasta, and beans. Cooking Cook foods with extra-virgin olive oil instead of using butter or other vegetable oils. Have meat as a side dish, and have vegetables or grains as your main dish. This means having meat in small portions or adding small amounts of meat to foods like pasta or stew. Use beans or vegetables instead of meat in common dishes like chili or lasagna. Experiment with different cooking methods. Try roasting or broiling vegetables instead of steaming or sauteing them. Add frozen vegetables to soups, stews, pasta, or rice. Add nuts or seeds for added healthy fat at each meal. You can add these to yogurt, salads, or vegetable dishes. Marinate fish or vegetables using olive oil, lemon juice, garlic, and fresh herbs. Meal planning  Plan to eat 1 vegetarian meal one day each week. Try to work up to 2 vegetarian meals, if possible. Eat seafood 2 or more times a week. Have healthy snacks readily available, such as: Vegetable sticks with hummus. Greek yogurt. Fruit and nut trail mix. Eat balanced meals throughout the week. This includes: Fruit: 2-3 servings a day Vegetables: 4-5 servings a day Low-fat dairy: 2 servings a day Fish, poultry, or lean meat: 1 serving a day Beans and legumes: 2 or more servings a week Nuts and seeds: 1-2 servings a day Whole grains: 6-8 servings a day  Extra-virgin olive oil: 3-4 servings a day Limit red meat and sweets to only a few servings a month What are my food choices? Mediterranean diet Recommended Grains: Whole-grain pasta. Brown rice. Bulgar wheat. Polenta. Couscous. Whole-wheat bread. [place]. Vegetables: Artichokes. Beets. Broccoli. Cabbage. Carrots.  Eggplant. Green beans. Chard. Kale. Spinach. Onions. Leeks. Peas. Squash. Tomatoes. Peppers. Radishes. Fruits: Apples. Apricots. Avocado. Berries. Bananas. Cherries. Dates. Figs. Grapes. Lemons. Melon. Oranges. Peaches. Plums. Pomegranate. Meats and other protein foods: Beans. Almonds. Sunflower seeds. Pine nuts. Peanuts. Humptulips. Salmon. Scallops. Shrimp. Colony. Tilapia. Clams. Oysters. Eggs. Dairy: Low-fat milk. Cheese. Greek yogurt. Beverages: Water. Red wine. Herbal tea. Fats and oils: Extra virgin olive oil. Avocado oil. Grape seed oil. Sweets and desserts: [place] yogurt with honey. Baked apples. Poached pears. Trail mix. Seasoning and other foods: Basil. Cilantro. Coriander. Cumin. Mint. Parsley. Sage. Rosemary. Tarragon. Garlic. Oregano. Thyme. Pepper. Balsalmic vinegar. Tahini. Hummus. Tomato sauce. Olives. Mushrooms. Limit these Grains: Prepackaged pasta or rice dishes. Prepackaged cereal with added sugar. Vegetables: Deep fried potatoes (french fries). Fruits: Fruit canned in syrup. Meats and other protein foods: Beef. Pork. Lamb. Poultry with skin. Hot dogs. Berniece Salines. Dairy: Ice cream. Sour cream. Whole milk. Beverages: Juice. Sugar-sweetened soft drinks. Beer. Liquor and spirits. Fats and oils: Butter. Canola oil. Vegetable oil. Beef fat (tallow). Lard. Sweets and desserts: Cookies. Cakes. Pies. Candy. Seasoning and other foods: Mayonnaise. Premade sauces and marinades. The items listed may not be a complete list. Talk with your dietitian about what dietary choices are right for you. Summary The Mediterranean diet includes both food and lifestyle choices. Eat a variety of fresh fruits and vegetables, beans, nuts, seeds, and whole grains. Limit the amount of red meat and sweets that you eat. Talk with your health care provider about whether it is safe for you to drink red wine in moderation. This means 1 glass a day for nonpregnant women and 2 glasses a day for men. A glass of wine equals 5  oz (150 mL). This information is not intended to replace advice given to you by your health care provider. Make sure you discuss any questions you have with your health care provider. Document Revised: 01/11/2016 Document Reviewed: 01/04/2016 Elsevier Patient Education  [place].

## 2021-01-26 NOTE — Patient Instructions (Addendum)
Call me in 2 weeks with your blood pressure reading Don't check your blood when you are stressed Make sure you rest for 5 min before you check your blood pressure  Hypertension "High blood pressure"  Hypertension is often called "The Silent Killer." It rarely causes symptoms until it is extremely  high or has done damage to other organs in the body. For this reason, you should have your  blood pressure checked regularly by your physician. We will check your blood pressure  every time you see a provider at one of our offices.   Your blood pressure reading consists of two numbers. Ideally, blood pressure should be  below 120/80. The first ("top") number is called the systolic pressure. It measures the  pressure in your arteries as your heart beats. The second ("bottom") number is called the diastolic pressure. It measures the pressure in your arteries as the heart relaxes between beats.  The benefits of getting your blood pressure under control are enormous. A 10-point  reduction in systolic blood pressure can reduce your risk of stroke by 27% and heart failure by 28%  Your blood pressure goal is <130/80  To check your pressure at home you will need to:  1. Sit up in a chair, with feet flat on the floor and back supported. Do not cross your ankles or legs. 2. Rest your left arm so that the cuff is about heart level. If the cuff goes on your upper arm,  then just relax the arm on the table, arm of the chair or your lap. If you have a wrist cuff, we  suggest relaxing your wrist against your chest (think of it as Pledging the Flag with the  wrong arm).  3. Place the cuff snugly around your arm, about 1 inch above the crook of your elbow. The  cords should be inside the groove of your elbow.  4. Sit quietly, with the cuff in place, for about 5 minutes. After that 5 minutes press the power  button to start a reading. 5. Do not talk or move while the reading is taking place.  6. Record  your readings on a sheet of paper. Although most cuffs have a memory, it is often  easier to see a pattern developing when the numbers are all in front of you.  7. You can repeat the reading after 1-3 minutes if it is recommended  Make sure your bladder is empty and you have not had caffeine or tobacco within the last 30 min  Always bring your blood pressure log with you to your appointments. If you have not brought your monitor in to be double checked for accuracy, please bring it to your next appointment.

## 2021-01-26 NOTE — Progress Notes (Signed)
Patient ID: Kenneth Montoya                 DOB: 12/23/1940                      MRN: [id number]     HPI: Kenneth Montoya is a 80 y.o. male referred by Dr. Tamala Julian to HTN clinic. PMH is significant for hypertension, asymptomatic atherosclerosis of the carotid arteries, SIADH and RBBB. Patient called into the clinic earlier this week concerned that his blood pressure was high. He did not know the readings and was concerned his monitor was incorrect. He was scheduled in the HTN clinic.  Patient presents today to clinic today. He is a very pleasant gentleman of strong faith and transparency. He reports that recently 4 of this good friends passed away. One of them was his best friend and it really shook him. He has other stressors in his life as well he reports. Still working for [employer] because he does not have much savings left. He has no plans to change his diet. Had a stroke recently. Not sure his BO cuff is accurate. Checking his blood pressure several times per day.  Home BP cuff 126/63 vs clinic manual 120/60 Patient was placing arrow over the center of his arm, not the art mark. Not resting prior to checking. He does exercise daily.  No diuretics due to SIADH, hyponatremia  Current HTN meds: losartan '100mg'$  daily, amlodipine '10mg'$  daily Previously tried:  BP goal: <130/80  Family History: The patient's family history is negative for Colon cancer, Colon polyps, Esophageal cancer, Rectal cancer, and Stomach cancer.  Social History:   Diet: nachos, stamey's, ham sandwich  Exercise: walks and goes to the gym daily  Home BP readings: 142/58, 123/52, 144/64, 147/63, 161/64, 133/55, 151/70, 162/71, 147/67, 154/65, 144/63,156/62, 168/70, 142/62, 146/66, 157/62, 149/66  Wt Readings from Last 3 Encounters:  01/24/21 190 lb 14.7 oz (86.6 kg)  12/15/20 191 lb (86.6 kg)  12/09/20 214 lb 0.8 oz (97.1 kg)   BP Readings from Last 3 Encounters:  01/24/21 134/77  12/15/20 140/70  12/10/20 (!)  166/91   Pulse Readings from Last 3 Encounters:  01/24/21 (!) 57  12/15/20 70  12/10/20 92    Renal function: Estimated Creatinine Clearance: 49.4 mL/min (A) (by C-G formula based on SCr of 1.28 mg/dL (H)).  Past Medical History:  Diagnosis Date   Arthritis    generalized   BPH (benign prostatic hyperplasia)    Cancer of skin, squamous cell    frozen    Cataract    bilateral sx   GERD (gastroesophageal reflux disease)    on meds   Hypertension    on meds   Shingles     Current Outpatient Medications on File Prior to Visit  Medication Sig Dispense Refill   acetaminophen (TYLENOL) 500 MG tablet Take 1,000 mg by mouth in the morning and at bedtime.     amLODipine (NORVASC) 10 MG tablet TAKE 1 TABLET BY MOUTH AT  BEDTIME 90 tablet 1   ascorbic acid (VITAMIN C) 1000 MG tablet Take 1,000 mg by mouth at bedtime.     demeclocycline (DECLOMYCIN) 150 MG tablet Take 1 tablet (150 mg total) by mouth 2 (two) times daily. 180 tablet 0   fluticasone (FLONASE) 50 MCG/ACT nasal spray Place 1 spray into both nostrils daily. 48 g 1   losartan (COZAAR) 100 MG tablet TAKE 1 TABLET BY MOUTH  DAILY 90 tablet  1   olopatadine (PATANOL) 0.1 % ophthalmic solution Place 1 drop into both eyes at bedtime.     omeprazole (PRILOSEC) 40 MG capsule TAKE 1 CAPSULE BY MOUTH  DAILY 90 capsule 1   psyllium (METAMUCIL) 58.6 % powder Take 1 packet by mouth at bedtime.     tamsulosin (FLOMAX) 0.4 MG CAPS capsule Take 0.4 mg by mouth at bedtime.     No current facility-administered medications on file prior to visit.    No Known Allergies   Assessment/Plan:  1. Hypertension - Patient's blood pressure is at goal of <130/80 in clinic today. His home readings are higher. I think this is in part due to improper technique and due to checking while he is stressed at home. I have asked him to checking his blood pressure only once a day. Proper cuff placement reviewed. Rest 5 min prior to checking. If BP is high. Wait  1 min and then check again. Do not check BP when stressed/upset. Home cuff is accurate. I have asked patient to call me in 2 weeks with his blood pressures. If his BP is still high at home, we could consider changing ARB's to a strong/longer lasting ARB. No diuretics due to hyponatremia. We also talked about some ways to deal with stress. Will await BP readings from patient.  Thank you,  Ramond Dial, Pharm.D, BCPS, CPP [place]  [id number], [place], [place] [address]  Phone: [phone]; Fax: [phone]

## 2021-01-30 ENCOUNTER — Other Ambulatory Visit: Payer: Self-pay | Admitting: Internal Medicine

## 2021-02-04 ENCOUNTER — Emergency Department (HOSPITAL_COMMUNITY): Payer: Medicare Other

## 2021-02-04 ENCOUNTER — Encounter (HOSPITAL_COMMUNITY): Payer: Self-pay | Admitting: Emergency Medicine

## 2021-02-04 ENCOUNTER — Other Ambulatory Visit: Payer: Self-pay

## 2021-02-04 ENCOUNTER — Observation Stay (HOSPITAL_COMMUNITY)
Admission: EM | Admit: 2021-02-04 | Discharge: 2021-02-05 | Disposition: A | Discharge status: Home / Self Care | Payer: Medicare Other | Attending: Internal Medicine | Admitting: Internal Medicine

## 2021-02-04 DIAGNOSIS — R42 Dizziness and giddiness: Secondary | ICD-10-CM | POA: Diagnosis not present

## 2021-02-04 DIAGNOSIS — Z20822 Contact with and (suspected) exposure to covid-19: Secondary | ICD-10-CM | POA: Diagnosis not present

## 2021-02-04 DIAGNOSIS — E871 Hypo-osmolality and hyponatremia: Secondary | ICD-10-CM

## 2021-02-04 DIAGNOSIS — E785 Hyperlipidemia, unspecified: Secondary | ICD-10-CM | POA: Diagnosis not present

## 2021-02-04 DIAGNOSIS — I63423 Cerebral infarction due to embolism of bilateral anterior cerebral arteries: Secondary | ICD-10-CM | POA: Diagnosis not present

## 2021-02-04 DIAGNOSIS — Z7982 Long term (current) use of aspirin: Secondary | ICD-10-CM | POA: Insufficient documentation

## 2021-02-04 DIAGNOSIS — I639 Cerebral infarction, unspecified: Secondary | ICD-10-CM | POA: Diagnosis not present

## 2021-02-04 DIAGNOSIS — Z79899 Other long term (current) drug therapy: Secondary | ICD-10-CM | POA: Diagnosis not present

## 2021-02-04 DIAGNOSIS — H539 Unspecified visual disturbance: Secondary | ICD-10-CM

## 2021-02-04 HISTORY — DX: Cerebral infarction, unspecified: I63.9

## 2021-02-04 LAB — DIFFERENTIAL
Abs Immature Granulocytes: 0.06 10*3/uL (ref 0.00–0.07)
Basophils Absolute: 0.1 10*3/uL (ref 0.0–0.1)
Basophils Relative: 2 %
Eosinophils Absolute: 0.3 10*3/uL (ref 0.0–0.5)
Eosinophils Relative: 5 %
Immature Granulocytes: 1 %
Lymphocytes Relative: 23 %
Lymphs Abs: 1.5 10*3/uL (ref 0.7–4.0)
Monocytes Absolute: 0.5 10*3/uL (ref 0.1–1.0)
Monocytes Relative: 8 %
Neutro Abs: 3.8 10*3/uL (ref 1.7–7.7)
Neutrophils Relative %: 61 %

## 2021-02-04 LAB — COMPREHENSIVE METABOLIC PANEL
ALT: 16 U/L (ref 0–44)
AST: 23 U/L (ref 15–41)
Albumin: 4.1 g/dL (ref 3.5–5.0)
Alkaline Phosphatase: 78 U/L (ref 38–126)
Anion gap: 5 (ref 5–15)
BUN: 17 mg/dL (ref 8–23)
CO2: 26 mmol/L (ref 22–32)
Calcium: 9 mg/dL (ref 8.9–10.3)
Chloride: 99 mmol/L (ref 98–111)
Creatinine, Ser: 1.04 mg/dL (ref 0.61–1.24)
GFR, Estimated: >60 mL/min (ref >60)
Glucose, Bld: 96 mg/dL (ref 70–99)
Potassium: 4.4 mmol/L (ref 3.5–5.1)
Sodium: 130 mmol/L — ABNORMAL LOW (ref 135–145)
Total Bilirubin: 1 mg/dL (ref 0.3–1.2)
Total Protein: 6.8 g/dL (ref 6.5–8.1)

## 2021-02-04 LAB — CBC
HCT: 39.1 % (ref 39.0–52.0)
Hemoglobin: 13.4 g/dL (ref 13.0–17.0)
MCH: 31.7 pg (ref 26.0–34.0)
MCHC: 34.3 g/dL (ref 30.0–36.0)
MCV: 92.4 fL (ref 80.0–100.0)
Platelets: 191 10*3/uL (ref 150–400)
RBC: 4.23 MIL/uL (ref 4.22–5.81)
RDW: 12.1 % (ref 11.5–15.5)
WBC: 6.2 10*3/uL (ref 4.0–10.5)
nRBC: 0 % (ref 0.0–0.2)

## 2021-02-04 LAB — RESP PANEL BY RT-PCR (FLU A&B, COVID) ARPGX2
Influenza A by PCR: NEGATIVE
Influenza B by PCR: NEGATIVE
SARS Coronavirus 2 by RT PCR: NEGATIVE

## 2021-02-04 LAB — PROTIME-INR
INR: 1 (ref 0.8–1.2)
Prothrombin Time: 13.5 seconds (ref 11.4–15.2)

## 2021-02-04 LAB — APTT: aPTT: 32 seconds (ref 24–36)

## 2021-02-04 MED ORDER — OLOPATADINE HCL 0.1 % OP SOLN
1.0000 [drp] | Freq: Every day | OPHTHALMIC | Status: DC
  Filled 2021-02-04: qty 5

## 2021-02-04 MED ORDER — ONDANSETRON HCL 4 MG PO TABS: 4.0000 mg | ORAL_TABLET | Freq: Four times a day (QID) | ORAL | Status: DC | PRN

## 2021-02-04 MED ORDER — ASPIRIN EC 81 MG PO TBEC
81.0000 mg | DELAYED_RELEASE_TABLET | Freq: Every day | ORAL | Status: DC
  Filled 2021-02-04 (×2): qty 1

## 2021-02-04 MED ORDER — SODIUM CHLORIDE 0.9 % IV SOLN: INTRAVENOUS | Status: DC

## 2021-02-04 MED ORDER — BISACODYL 10 MG RE SUPP
10.0000 mg | Freq: Once | RECTAL | Status: DC
Start: 2021-02-04 — End: 2021-02-06

## 2021-02-04 MED ORDER — ATORVASTATIN CALCIUM 10 MG PO TABS
10.0000 mg | ORAL_TABLET | Freq: Every day | ORAL | Status: DC
  Administered 2021-02-04: 10 mg via ORAL
  Filled 2021-02-04 (×2): qty 1

## 2021-02-04 MED ORDER — ONDANSETRON HCL 4 MG/2ML IJ SOLN: 4.0000 mg | Freq: Four times a day (QID) | INTRAMUSCULAR | Status: DC | PRN

## 2021-02-04 MED ORDER — PANTOPRAZOLE SODIUM 40 MG PO TBEC
40.0000 mg | DELAYED_RELEASE_TABLET | Freq: Every day | ORAL | Status: DC
  Administered 2021-02-04: 40 mg via ORAL
  Filled 2021-02-04 (×2): qty 1

## 2021-02-04 MED ORDER — CLOPIDOGREL BISULFATE 75 MG PO TABS
75.0000 mg | ORAL_TABLET | Freq: Every day | ORAL | Status: DC
  Administered 2021-02-04 – 2021-02-05 (×2): 75 mg via ORAL
  Filled 2021-02-04 (×2): qty 1

## 2021-02-04 MED ORDER — ENOXAPARIN SODIUM 40 MG/0.4ML IJ SOSY
40.0000 mg | PREFILLED_SYRINGE | INTRAMUSCULAR | Status: DC
  Administered 2021-02-04: 40 mg via SUBCUTANEOUS
  Filled 2021-02-04: qty 0.4

## 2021-02-04 MED ORDER — ACETAMINOPHEN 325 MG PO TABS
650.0000 mg | ORAL_TABLET | Freq: Four times a day (QID) | ORAL | Status: DC | PRN
  Administered 2021-02-04: 650 mg via ORAL
  Filled 2021-02-04: qty 2

## 2021-02-04 MED ORDER — SODIUM CHLORIDE 0.9% FLUSH
3.0000 mL | Freq: Once | INTRAVENOUS | Status: AC
  Administered 2021-02-04: 3 mL via INTRAVENOUS

## 2021-02-04 MED ORDER — LORAZEPAM 2 MG/ML IJ SOLN
0.5000 mg | Freq: Once | INTRAMUSCULAR | Status: AC | PRN
  Administered 2021-02-04: 0.5 mg via INTRAVENOUS
  Filled 2021-02-04: qty 1

## 2021-02-04 NOTE — ED Provider Notes (Signed)
Pt signed out by Dr. Tyrone Nine pending MRI results.  MRI:  IMPRESSION: 1. Small probable acute or subacute infarct in the right frontal cortex and punctate acute infarct in the left frontal cortex 2. Remote bilateral occipital lobe infarcts with evidence of prior hemorrhage on the right. 3. Small remote bilateral cerebellar lacunar infarcts. 4. Moderate to advanced chronic microvascular ischemic disease. 5. Moderate paranasal sinus mucosal thickening.  Pt did receive the Zio monitor in the mail yesterday, but did not put it on as he did not know how.    Pt d/w Dr. Rory Percy (neurology) who recommends admission for monitoring.  He will see in consult.  He believes strokes are likely embolic like Dr. Tomi Likens and pt may need to be on anticoagulation.  He will decide after seeing patient.  Pt d/w Dr. Darrick Meigs ([facility]) for admission.       Isla Pence, MD 02/04/21 2010

## 2021-02-04 NOTE — ED Triage Notes (Signed)
Pt went to bed around 11:30pm last night.  Woke up with dizziness and change in vision.  No arm drift.  VAN negative.

## 2021-02-04 NOTE — ED Notes (Signed)
Pt still in MRI at this time

## 2021-02-04 NOTE — Consult Note (Addendum)
Neurology Consultation  Reason for Consult: Visual disturbance Referring Physician: Dr. Isla Pence  CC: Visual disturbance  History is obtained from: Patient, chart  HPI: Kenneth Montoya is a 80 y.o. male past medical history of hypertension, SIADH, recent right occipital stroke with some residual left-sided visual deficit-which happened in the setting of COVID positivity with unclear etiology of the stroke, presenting to the emergency room for evaluation of dizziness-which she says was more of imbalance and not lightheadedness this morning.  Last known well was sometime around 11:30 PM last night when he went to bed.  Upon waking up this morning he felt rather unsteady and also felt that he might have had some visual disturbance similar to what he had with a stroke in July. He followed up with Dr. Marland Mcalpine completed 3 weeks of dual antiplatelets and was converted to 1 antiplatelet-aspirin only.  He had refused longer-term cardiac monitoring and also had left the hospital before an echocardiogram could have been completed. Today, on imaging due to his presentation with strokelike symptoms, MRI of the brain shows punctate areas of restricted diffusion in bilateral frontal lobes raising concern for an embolic etiology. He did agree to getting a 30-day heart monitor from Dr. Tomi Likens which was delivered to his house a couple of days ago but he has not started to monitor his heart rhythm yet. He continues to drive but reports that he does have a hard time seeing cars coming from the left side.  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  Chart review reveals that the patient was not very willing to stay in for a full work-up during his last admission in July and would not really cooperate with the care team to understand the rationale for the work-up.  He also seemed somewhat irritable today, but seemed  willing to complete the work-up this time.  He did have a considerably hard time deciphering my "dialect" during my communication with him.  I did explain to him very clearly with his RN in the room that we need to monitor his heart while he is in more carefully to rule out atrial fibrillation and if no evidence of atrial fibrillation is found, he would need longer-term monitoring-in the form of 30-day monitor but most likely with a loop recorder-that he has initially refused.  I explained to him that his optimal stroke risk prevention will depend on correct diagnosis-in case of atrial fibrillation, antiplatelets would not be of much use in stroke prevention and he would need anticoagulants but without a definitive diagnosis of atrial fibrillation, using antiplatelets is not indicated due to the significant risk of bleed.  LKW: 11:30 PM last night tpa given?: no, recent stroke, outside the window Premorbid modified Rankin scale (mRS):2   ROS: Full ROS was performed and is negative except as noted in the HPI.  Past Medical History:  Diagnosis Date   Arthritis    generalized   BPH (benign prostatic hyperplasia)    Cancer of skin, squamous cell    frozen    Cataract    bilateral sx   GERD (gastroesophageal reflux disease)    on meds   Hypertension    on meds   Shingles    Stroke [facility])     Family History  Problem Relation Age of Onset   Colon polyps Mother 58   Stroke Maternal Grandfather    Colon cancer  Neg Hx    Esophageal cancer Neg Hx    Rectal cancer Neg Hx    Stomach cancer Neg Hx     Social History:   reports that he has never smoked. He has never used smokeless tobacco. He reports current alcohol use of about 2.0 standard drinks per week. He reports that he does not use drugs.  Medications  Current Facility-Administered Medications:    0.9 %  sodium chloride infusion, , Intravenous, Continuous, Darrick Meigs, Marge Duncans, MD, Last Rate: 10 mL/hr at 02/04/21 2333, New Bag at 02/04/21  2333   acetaminophen (TYLENOL) tablet 650 mg, 650 mg, Oral, Q6H PRN, Oswald Hillock, MD, 650 mg at 02/04/21 2333   aspirin EC tablet 81 mg, 81 mg, Oral, Daily, Darrick Meigs, Gagan S, MD   atorvastatin (LIPITOR) tablet 10 mg, 10 mg, Oral, Daily, Darrick Meigs, Gagan S, MD, 10 mg at 02/04/21 2218   bisacodyl (DULCOLAX) suppository 10 mg, 10 mg, Rectal, Once, [place], Marge Duncans, MD   clopidogrel (PLAVIX) tablet 75 mg, 75 mg, Oral, Daily, Darrick Meigs, Gagan S, MD, 75 mg at 02/04/21 2333   enoxaparin (LOVENOX) injection 40 mg, 40 mg, Subcutaneous, Q24H, Darrick Meigs, Gagan S, MD, 40 mg at 02/04/21 2223   olopatadine (PATANOL) 0.1 % ophthalmic solution 1 drop, 1 drop, Both Eyes, QHS, Lama, Gagan S, MD   ondansetron (ZOFRAN) tablet 4 mg, 4 mg, Oral, Q6H PRN **OR** ondansetron (ZOFRAN) injection 4 mg, 4 mg, Intravenous, Q6H PRN, Darrick Meigs, Marge Duncans, MD   pantoprazole (PROTONIX) EC tablet 40 mg, 40 mg, Oral, Daily, Darrick Meigs, Marge Duncans, MD, 40 mg at 02/04/21 2219  Current Outpatient Medications:    acetaminophen (TYLENOL) 500 MG tablet, Take 1,000 mg by mouth in the morning and at bedtime., Disp: , Rfl:    amLODipine (NORVASC) 10 MG tablet, TAKE 1 TABLET BY MOUTH AT  BEDTIME, Disp: 90 tablet, Rfl: 1   ascorbic acid (VITAMIN C) 1000 MG tablet, Take 1,000 mg by mouth at bedtime., Disp: , Rfl:    aspirin EC 81 MG tablet, Take 81 mg by mouth daily. Swallow whole., Disp: , Rfl:    atorvastatin (LIPITOR) 10 MG tablet, Take 1 tablet (10 mg total) by mouth daily., Disp: 30 tablet, Rfl: 0   fluticasone (FLONASE) 50 MCG/ACT nasal spray, Place 1 spray into both nostrils daily., Disp: 48 g, Rfl: 1   losartan (COZAAR) 100 MG tablet, TAKE 1 TABLET BY MOUTH  DAILY, Disp: 90 tablet, Rfl: 1   olopatadine (PATANOL) 0.1 % ophthalmic solution, Place 1 drop into both eyes at bedtime., Disp: , Rfl:    omeprazole (PRILOSEC) 40 MG capsule, TAKE 1 CAPSULE BY MOUTH  DAILY, Disp: 90 capsule, Rfl: 1   psyllium (METAMUCIL) 58.6 % powder, Take 1 packet by mouth at bedtime., Disp: ,  Rfl:    tamsulosin (FLOMAX) 0.4 MG CAPS capsule, Take 0.4 mg by mouth at bedtime., Disp: , Rfl:   Exam: Current vital signs: BP 134/77   Pulse 91   Temp (!) 97.2 F (36.2 C) (Oral)   Resp 15   SpO2 96%  Vital signs in last 24 hours: Temp:  [97.2 F (36.2 C)-97.9 F (36.6 C)] 97.2 F (36.2 C) (09/11 2321) Pulse Rate:  [57-91] 91 (09/11 2321) Resp:  [13-22] 15 (09/11 2321) BP: (134-165)/(76-98) 134/77 (09/11 2321) SpO2:  [96 %-100 %] 96 % (09/11 2321)  General: Awake alert oriented x3 HEENT: Normocephalic/atraumatic CVS: Regular rate rhythm Abdomen nondistended nontender Respiratory: Breathing well saturating normally on room air Neurological  exam Awake alert oriented x3 No dysarthria.  No aphasia. Cranial nerves: Pupils equal round react light, extraocular movements appeared unhindered, he does seem to have a left lower quadrantanopsia which is homonymous, face appears symmetric. Motor examination with no drift. Sensation intact to light touch without extinction Coordination: With no dysmetria NIH stroke scale-1 for visual  Labs I have reviewed labs in epic and the results pertinent to this consultation are:  CBC    Component Value Date/Time   WBC 6.2 02/04/2021 1240   RBC 4.23 02/04/2021 1240   HGB 13.4 02/04/2021 1240   HCT 39.1 02/04/2021 1240   PLT 191 02/04/2021 1240   MCV 92.4 02/04/2021 1240   MCH 31.7 02/04/2021 1240   MCHC 34.3 02/04/2021 1240   RDW 12.1 02/04/2021 1240   LYMPHSABS 1.5 02/04/2021 1240   MONOABS 0.5 02/04/2021 1240   EOSABS 0.3 02/04/2021 1240   BASOSABS 0.1 02/04/2021 1240    CMP     Component Value Date/Time   NA 130 (L) 02/04/2021 1240   NA 133 (A) 09/01/2019 0000   K 4.4 02/04/2021 1240   CL 99 02/04/2021 1240   CO2 26 02/04/2021 1240   GLUCOSE 96 02/04/2021 1240   BUN 17 02/04/2021 1240   BUN 24 (A) 09/01/2019 0000   CREATININE 1.04 02/04/2021 1240   CALCIUM 9.0 02/04/2021 1240   PROT 6.8 02/04/2021 1240   ALBUMIN 4.1  02/04/2021 1240   AST 23 02/04/2021 1240   ALT 16 02/04/2021 1240   ALKPHOS 78 02/04/2021 1240   BILITOT 1.0 02/04/2021 1240   GFRNONAA >60 02/04/2021 1240    Lipid Panel     Component Value Date/Time   CHOL 132 12/15/2020 0944   TRIG 40.0 12/15/2020 0944   HDL 73.80 12/15/2020 0944   CHOLHDL 2 12/15/2020 0944   VLDL 8.0 12/15/2020 0944   LDLCALC 50 12/15/2020 0944     Imaging I have reviewed the images obtained:  CT-head today-no acute changes.  Encephalomalacia in the right occipital lobe from prior known ischemic infarct.  Chronic small area of encephalomalacia in the posterior right frontal lobe.  Deep white matter angiopathy.  Chronic sinusitis.  MRI examination of the brain from today-small probable acute or subacute infarction in the right frontal cortex and punctate acute infarct in the left frontal cortex.  Remote bilateral occipital lobe infarcts bilaterally with evidence of blood elation products in the right occipital lobe.  Small remote bilateral cerebellar lacunar infarctions.  Moderate advanced chronic microvascular ischemic disease.  Moderate paranasal sinus mucosal thickening.  MR angio head and neck from July 2022: Negative intracranial MRI for large vessel occlusion.  Severe distal left P2 and right P3 stenosis.  MRA neck with short segment moderate stenosis of up to 40 to 50% about the proximal right ICA.  No significant stenosis involving the left carotid artery system.  Assessment: 80 year old with above past medical history that includes a recent right occipital stroke with some residual' \\visual'$  deficits, presenting with complaints of dizziness and worsening of some visual symptoms.  MRI reveals bifrontal punctate acute infarctions. Given his prior stroke and the stimulators residual diffusion, strokes patient for underlying atrial fibrillation. He has been somewhat resistant to completing his work-up during last admission-did not get an echocardiogram. He did  follow-up with the outpatient neurologist but was reluctant to undergo loop recorder placement.  Did agree to 30-day heart monitor, which has been delivered to him but has not been placed on him for monitoring yet. I  had a detailed discussion with him regarding the importance of cardiac monitoring and trying to figure out the correct diagnosis because that would change the management in terms of stroke prevention. I had this discussion in detail, while his nurse was in the room, but the patient seemed to have a hard time deciphering my dialect for some reason and I am not sure if he completely understood the rationale. There is no family around to have a better discussion.  I will definitely recommend having detailed discussion with family tomorrow morning.  Impression: Bifrontal acute/subacute ischemic infarctions-likely cardioembolic  Recommendations: Admit to hospitalist 2D echocardiogram He did have vascular imaging in July-I do not feel strongly about repeating vascular imaging but will defer to the stroke team, who can decide to repeat imaging if desired. A1c Lipid panel Frequent neurochecks Telemetry PT OT Speech therapy Dual antiplatelet and statin. N.p.o. until cleared by stroke swallow screen or formal swallow evaluation Permissive hypertension-treat only if systolic blood pressures greater than 220 on a as needed basis for the next 24 to 48 hours.  Eventual final goal blood pressure at discharge should be 140/90 or below. Recommendations on long-term cardiac monitoring based on stroke team rounding and 2D echocardiogram.  I would recommend placing a loop recorder but patient has been resistant in the past to this idea.  Stroke team to readdress this with the patient.  Plan was discussed with Dr. Darrick Meigs, admitting hospitalist  Stroke team will follow with you  -- Amie Portland, MD Neurologist [facility] Pager: [phone]

## 2021-02-04 NOTE — ED Notes (Signed)
Swallow screen passed 

## 2021-02-04 NOTE — H&P (Addendum)
TRH H&P    Patient Demographics:    Zahmari Milkey, is a 80 y.o. male  MRN: [id number]  DOB - [birth year]  Admit Date - 02/04/2021  Referring MD/NP/PA: Isla Pence  Outpatient Primary MD for the patient is Janith Lima, MD  Patient coming from: Home  Chief complaint-visual disturbance   HPI:    Hristopher Hopman  is a 80 y.o. male, with medical history of hypertension, osteoarthritis, SIADH, bilateral knee replacement, recent stroke in July 2022, at that time MRI brain showed acute right middle occipital infarct as well as extensive chronic small vessel ischemic changes.  Echocardiogram was not performed at time as patient desired to be discharged.  He was discharged on dual antiplatelet therapy with Plavix and aspirin for 21 days and later aspirin alone as well as started on atorvastatin 40 mg daily.  Patient discontinued atorvastatin because he felt dizzy and weak.  Dose of atorvastatin was reduced to 10 mg daily due to side effects. This morning patient said that he had visual symptoms, which started this morning.  Patient is a poor historian, does not member exact time on the symptoms started.  He denies slurred speech, no one-sided numbness or weakness of extremities.  At this time visual symptoms have resolved. He denies chest pain or shortness of breath.  MRI brain showed small probable acute or subacute infarct in the right frontal cortex and punctate acute infarct in the left frontal cortex.  Remote bilateral occipital lobe infarcts with evidence of prior hemorrhage on the right.  Patient did receive 30-day monitoring ZIO monitor in the mail yesterday but did not put it on yet.  He denies chest pain or shortness of breath Denies nausea vomiting or diarrhea Denies abdominal pain or dysuria    Review of systems:    In addition to the HPI above,   All other systems reviewed and are negative.     Past History of the following :    Past Medical History:  Diagnosis Date   Arthritis    generalized   BPH (benign prostatic hyperplasia)    Cancer of skin, squamous cell    frozen    Cataract    bilateral sx   GERD (gastroesophageal reflux disease)    on meds   Hypertension    on meds   Shingles    Stroke [facility])       Past Surgical History:  Procedure Laterality Date   CATARACT EXTRACTION, BILATERAL     COLONOSCOPY  06/27/2020   COLONOSCOPY  2018   gum graft     INGUINAL HERNIA REPAIR     as infant   LUMBAR LAMINECTOMY  06/2015   POLYPECTOMY  2018   TA x 3   REVERSE TOTAL SHOULDER ARTHROPLASTY Bilateral    TONSILLECTOMY AND ADENOIDECTOMY     age 24    TOTAL KNEE ARTHROPLASTY Bilateral    TOTAL KNEE REVISION Right 12/25/2017   TRANSURETHRAL RESECTION OF PROSTATE     x 2   UPPER GASTROINTESTINAL ENDOSCOPY  Social History:      Social History   Tobacco Use   Smoking status: Never   Smokeless tobacco: Never  Substance Use Topics   Alcohol use: Yes    Alcohol/week: 2.0 standard drinks    Types: 2 Standard drinks or equivalent per week    Comment: wine 1-2 x a week and not every week        Family History :     Family History  Problem Relation Age of Onset   Colon polyps Mother 49   Stroke Maternal Grandfather    Colon cancer Neg Hx    Esophageal cancer Neg Hx    Rectal cancer Neg Hx    Stomach cancer Neg Hx       Home Medications:   Prior to Admission medications   Medication Sig Start Date End Date Taking? Authorizing Provider  acetaminophen (TYLENOL) 500 MG tablet Take 1,000 mg by mouth in the morning and at bedtime.    [provider]  amLODipine (NORVASC) 10 MG tablet TAKE 1 TABLET BY MOUTH AT  BEDTIME 09/09/20   Janith Lima, MD  ascorbic acid (VITAMIN C) 1000 MG tablet Take 1,000 mg by mouth at bedtime.    [provider]  aspirin EC 81 MG tablet Take 81 mg by mouth daily. Swallow whole.    [provider]  atorvastatin (LIPITOR) 10 MG tablet Take 1 tablet (10 mg total) by mouth daily. 01/26/21   Tomi Likens, Adam R, DO  fluticasone (FLONASE) 50 MCG/ACT nasal spray Place 1 spray into both nostrils daily. 04/03/20   Janith Lima, MD  losartan (COZAAR) 100 MG tablet TAKE 1 TABLET BY MOUTH  DAILY 01/21/21   Janith Lima, MD  olopatadine (PATANOL) 0.1 % ophthalmic solution Place 1 drop into both eyes at bedtime.    [provider]  omeprazole (PRILOSEC) 40 MG capsule TAKE 1 CAPSULE BY MOUTH  DAILY 01/14/21   Janith Lima, MD  psyllium (METAMUCIL) 58.6 % powder Take 1 packet by mouth at bedtime.    [provider]  tamsulosin (FLOMAX) 0.4 MG CAPS capsule Take 0.4 mg by mouth at bedtime. 09/17/19   [provider]     Allergies:    No Known Allergies   Physical Exam:   Vitals  Blood pressure (!) 158/98, pulse 80, temperature 97.9 F (36.6 C), temperature source Oral, resp. rate 13, SpO2 99 %.  1.  General: Appears in no acute distress  2. Psychiatric: Alert, oriented x3, intact insight and judgment  3. Neurologic: Cranial nerves II through XII grossly intact, no focal deficit noted at this time, motor strength 5/5 in all extremities, sensations intact bilaterally  4. HEENMT:  Atraumatic normocephalic, extraocular's are intact  5. Respiratory : Clear to auscultation bilaterally, no wheezing or crackles auscultated  6. Cardiovascular : S1-S2, regular, no murmur auscultated  7. Gastrointestinal:  Abdominal soft, nontender, no organomegaly  8. Skin:  No rashes noted  9.Musculoskeletal:  Trace edema in the lower extremities    Data Review:    CBC Recent Labs  Lab 02/04/21 1240  WBC 6.2  HGB 13.4  HCT 39.1  PLT 191  MCV 92.4  MCH 31.7  MCHC 34.3  RDW 12.1  LYMPHSABS 1.5  MONOABS 0.5  EOSABS 0.3  BASOSABS 0.1   ------------------------------------------------------------------------------------------------------------------  Results  for orders placed or performed during the hospital encounter of 02/04/21 (from the past 48 hour(s))  Protime-INR     Status: None  Collection Time: 02/04/21 12:40 PM  Result Value Ref Range   Prothrombin Time 13.5 11.4 - 15.2 seconds   INR 1.0 0.8 - 1.2    Comment: (NOTE) INR goal varies based on device and disease states. Performed at [facility], [place] [address], [place], [place] [address]   APTT     Status: None   Collection Time: 02/04/21 12:40 PM  Result Value Ref Range   aPTT 32 24 - 36 seconds    Comment: Performed at [place] [address] [place] [address]  CBC     Status: None   Collection Time: 02/04/21 12:40 PM  Result Value Ref Range   WBC 6.2 4.0 - 10.5 K/uL   RBC 4.23 4.22 - 5.81 MIL/uL   Hemoglobin 13.4 13.0 - 17.0 g/dL   HCT 39.1 39.0 - 52.0 %   MCV 92.4 80.0 - 100.0 fL   MCH 31.7 26.0 - 34.0 pg   MCHC 34.3 30.0 - 36.0 g/dL   RDW 12.1 11.5 - 15.5 %   Platelets 191 150 - 400 K/uL   nRBC 0.0 0.0 - 0.2 %    Comment: Performed at [facility], [place] [address] [place], [place] [address]  Differential     Status: None   Collection Time: 02/04/21 12:40 PM  Result Value Ref Range   Neutrophils Relative % 61 %   Neutro Abs 3.8 1.7 - 7.7 K/uL   Lymphocytes Relative 23 %   Lymphs Abs 1.5 0.7 - 4.0 K/uL   Monocytes Relative 8 %   Monocytes Absolute 0.5 0.1 - 1.0 K/uL   Eosinophils Relative 5 %   Eosinophils Absolute 0.3 0.0 - 0.5 K/uL   Basophils Relative 2 %   Basophils Absolute 0.1 0.0 - 0.1 K/uL   Immature Granulocytes 1 %   Abs Immature Granulocytes 0.06 0.00 - 0.07 K/uL    Comment: Performed at [place] [address], [place], [place] [address]  Comprehensive metabolic panel     Status: Abnormal   Collection Time: 02/04/21 12:40 PM  Result Value Ref Range   Sodium 130 (L) 135 - 145 mmol/L   Potassium 4.4 3.5 - 5.1 mmol/L   Chloride 99 98 - 111 mmol/L   CO2 26 22 - 32 mmol/L   Glucose, Bld 96 70 - 99 mg/dL     Comment: Glucose reference range applies only to samples taken after fasting for at least 8 hours.   BUN 17 8 - 23 mg/dL   Creatinine, Ser 1.04 0.61 - 1.24 mg/dL   Calcium 9.0 8.9 - 10.3 mg/dL   Total Protein 6.8 6.5 - 8.1 g/dL   Albumin 4.1 3.5 - 5.0 g/dL   AST 23 15 - 41 U/L   ALT 16 0 - 44 U/L   Alkaline Phosphatase 78 38 - 126 U/L   Total Bilirubin 1.0 0.3 - 1.2 mg/dL   GFR, Estimated >60 >60 mL/min    Comment: (NOTE) Calculated using the CKD-EPI Creatinine Equation (2021)    Anion gap 5 5 - 15    Comment: Performed at [place] [address] [place], [place] [address]    Chemistries  Recent Labs  Lab 02/04/21 1240  NA 130*  K 4.4  CL 99  CO2 26  GLUCOSE 96  BUN 17  CREATININE 1.04  CALCIUM 9.0  AST 23  ALT 16  ALKPHOS 78  BILITOT 1.0   ------------------------------------------------------------------------------------------------------------------  ------------------------------------------------------------------------------------------------------------------  GFR: Estimated Creatinine Clearance: 61 mL/min (by C-G formula based on SCr of 1.04 mg/dL). Liver Function Tests: Recent Labs  Lab 02/04/21 1240  AST 23  ALT 16  ALKPHOS 78  BILITOT 1.0  PROT 6.8  ALBUMIN 4.1   No results for input(s): LIPASE, AMYLASE in the last 168 hours. No results for input(s): AMMONIA in the last 168 hours. Coagulation Profile: Recent Labs  Lab 02/04/21 1240  INR 1.0   --------------------------------------------------------------------------------------------------------------- Urine analysis:    Component Value Date/Time   COLORURINE YELLOW 12/15/2020 [place] 12/15/2020 0944   LABSPEC 1.010 12/15/2020 0944   PHURINE 7.0 12/15/2020 0944   GLUCOSEU NEGATIVE 12/15/2020 0944   HGBUR NEGATIVE 12/15/2020 0944   BILIRUBINUR NEGATIVE 12/15/2020 0944   KETONESUR NEGATIVE 12/15/2020 0944   UROBILINOGEN 0.2 12/15/2020 0944   NITRITE  NEGATIVE 12/15/2020 0944   LEUKOCYTESUR NEGATIVE 12/15/2020 0944      Imaging Results:    CT HEAD WO CONTRAST  Result Date: 02/04/2021 CLINICAL DATA:  Dizziness and change in vision. EXAM: CT HEAD WITHOUT CONTRAST TECHNIQUE: Contiguous axial images were obtained from the base of the skull through the vertex without intravenous contrast. COMPARISON:  Brain MRI July 16-2022 FINDINGS: Brain: No evidence of acute infarction, hemorrhage, hydrocephalus, extra-axial collection or mass lesion/mass effect. Area of encephalomalacia in the right occipital lobe from prior known ischemic infarct. Similar small area of encephalomalacia in the posterior right frontal lobe. Deep white matter microangiopathy. Vascular: No hyperdense vessel or unexpected calcification. Skull: Normal. Negative for fracture or focal lesion. Sinuses/Orbits: Chronic sinusitis of the ethmoid, maxillary and frontal sinuses. Other: None. IMPRESSION: 1. No acute intracranial abnormality. 2. Encephalomalacia in the right occipital lobe from prior known ischemic infarct. 3. Chronic small area of encephalomalacia in the posterior right frontal lobe. 4. Deep white matter microangiopathy. 5. Chronic sinusitis. Electronically Signed   By: Fidela Salisbury M.D.   On: 02/04/2021 15:07   MR BRAIN WO CONTRAST  Result Date: 02/04/2021 CLINICAL DATA:  Neuro deficit, acute, stroke suspected EXAM: MRI HEAD WITHOUT CONTRAST TECHNIQUE: Multiplanar, multiecho pulse sequences of the brain and surrounding structures were obtained without intravenous contrast. COMPARISON:  Same day head CT. FINDINGS: Mildly motion limited study. Brain: Small acute or subacute infarct in the right frontal cortex (series 2, image 36) and punctate acute infarct in the left frontal cortex (series 2, image 38) no evidence of acute hemorrhage, hydrocephalus, mass lesion, midline shift, or extra-axial fluid collection. Encephalomalacia in the medial right occipital lobe, compatible  with sequela of previously seen infarct. Some curvilinear T2 hypointensity and susceptibility artifact in this region likely is the sequela of prior hemorrhage. No correlate hyperdensity on same day head CT to suggest acute hemorrhage. Additional remote infarct in the left medial occipital lobe. Additional scattered moderate to advanced T2 hyperintensities within the white matter, nonspecific but compatible with chronic microvascular ischemic disease. Small remote lacunar infarcts in the cerebellum bilaterally. Vascular: Major arterial flow voids are maintained at the skull base. Skull and upper cervical spine: Normal marrow signal. Sinuses/Orbits: Moderate paranasal sinus mucosal thickening. Other: No sizable mastoid effusions. IMPRESSION: 1. Small probable acute or subacute infarct in the right frontal cortex and punctate acute infarct in the left frontal cortex 2. Remote bilateral occipital lobe infarcts with evidence of prior hemorrhage on the right. 3. Small remote bilateral cerebellar lacunar infarcts. 4. Moderate to advanced chronic microvascular ischemic disease. 5. Moderate paranasal sinus mucosal thickening. Electronically Signed   By: Jamesetta So.D.  On: 02/04/2021 18:43    My personal review of EKG: Rhythm NSR, right bundle branch block   Assessment & Plan:    Active Problems:   Stroke (cerebrum) (HCC)   Stroke-MRI brain shows acute/subacute infarct right frontal cortex, punctate acute infarct in the left frontal cortex.  He was already on aspirin and Plavix for 3 weeks and was switched to aspirin alone.  We will admit patient, monitor on telemetry.  Discussed with neurology, Dr. Rory Percy recommends restarting Plavix along with aspirin.  Also will obtain TTE, if TTE is unremarkable we will likely need TEE to rule out cardioembolic source of stroke. Hyperlipidemia-continue atorvastatin 10 mg daily Hypertension-we will hold amlodipine, Cozaar for permissive hypertension.  Will await  neurology recommendations. BPH-hold tamsulosin as it can cause hypotension   DVT Prophylaxis-   Lovenox   AM Labs Ordered, also please review Full Orders  Family Communication: Admission, patients condition and plan of care including tests being ordered have been discussed with the patient and his wife at bedside who indicate understanding and agree with the plan and Code Status.  Code Status: Full code  Admission status: Inpatient :The appropriate admission status for this patient is INPATIENT. Inpatient status is judged to be reasonable and necessary in order to provide the required intensity of service to ensure the patient's safety. The patient's presenting symptoms, physical exam findings, and initial radiographic and laboratory data in the context of their chronic comorbidities is felt to place them at high risk for further clinical deterioration. Furthermore, it is not anticipated that the patient will be medically stable for discharge from the hospital within 2 midnights of admission. The following factors support the admission status of inpatient.     The patient's presenting symptoms include visual changes The worrisome physical exam findings include stroke. The initial radiographic and laboratory data are worrisome because of stroke. The chronic co-morbidities include hyperlipidemia.       * I certify that at the point of admission it is my clinical judgment that the patient will require inpatient hospital care spanning beyond 2 midnights from the point of admission due to high intensity of service, high risk for further deterioration and high frequency of surveillance required.*  Time spent in minutes : 60 minutes   Kaveh Kissinger S Breshae Belcher M.D

## 2021-02-04 NOTE — ED Provider Notes (Signed)
[facility] EMERGENCY DEPARTMENT Provider Note   CSN: [id number] Arrival date & time: 02/04/21  1153     History No chief complaint on file.   Kenneth Montoya is a 80 y.o. male.  80 yo M with a chief complaints of left-sided visual symptoms.  Patient had symptoms like this similarly when he was diagnosed with a stroke.  Symptoms have since resolved.  He had some dizziness this morning which is also resolved.  He is concerned that he may have had another stroke.  He denied one-sided numbness or weakness any difficulty speech or swallowing.  Denies cough congestion or fever denies chest pain shortness of breath abdominal pain.  Has been eating and drink normally.  No new medication changes.  The history is provided by the patient.  Illness Severity:  Moderate Onset quality:  Gradual Duration:  2 days Timing:  Constant Progression:  Worsening Chronicity:  New Associated symptoms: no abdominal pain, no chest pain, no congestion, no diarrhea, no fever, no headaches, no myalgias, no rash, no shortness of breath and no vomiting       Past Medical History:  Diagnosis Date   Arthritis    generalized   BPH (benign prostatic hyperplasia)    Cancer of skin, squamous cell    frozen    Cataract    bilateral sx   GERD (gastroesophageal reflux disease)    on meds   Hypertension    on meds   Shingles    Stroke [facility])     Patient Active Problem List   Diagnosis Date Noted   SIADH (syndrome of inappropriate ADH production) ([place]) 01/10/2021   Hyponatremia 01/05/2021   Hyperlipidemia with target LDL less than 100 12/17/2020   Stroke ([place]) 12/09/2020   Cerebral infarction ([place]) 12/09/2020   Visual field cut    Psychophysiological insomnia 11/21/2020   GAD (generalized anxiety disorder) 11/21/2020   BPH (benign prostatic hyperplasia)    Essential hypertension 11/08/2019   Gastroesophageal reflux disease without esophagitis 11/08/2019   Herpes simplex 11/08/2019    Primary erectile dysfunction 11/08/2019   Senile purpura ([place]) 05/13/2019   Hardening of the aorta (main artery of the heart) ([place]) 01/19/2019   Chronic allergic rhinitis due to pollen 03/24/2017   LPRD (laryngopharyngeal reflux disease) 03/24/2017   Bladder outflow obstruction 03/04/2016    Past Surgical History:  Procedure Laterality Date   CATARACT EXTRACTION, BILATERAL     COLONOSCOPY  06/27/2020   COLONOSCOPY  2018   gum graft     INGUINAL HERNIA REPAIR     as infant   LUMBAR LAMINECTOMY  06/2015   POLYPECTOMY  2018   TA x 3   REVERSE TOTAL SHOULDER ARTHROPLASTY Bilateral    TONSILLECTOMY AND ADENOIDECTOMY     age 27    TOTAL KNEE ARTHROPLASTY Bilateral    TOTAL KNEE REVISION Right 12/25/2017   TRANSURETHRAL RESECTION OF PROSTATE     x 2   UPPER GASTROINTESTINAL ENDOSCOPY         Family History  Problem Relation Age of Onset   Colon polyps Mother 73   Stroke Maternal Grandfather    Colon cancer Neg Hx    Esophageal cancer Neg Hx    Rectal cancer Neg Hx    Stomach cancer Neg Hx     Social History   Tobacco Use   Smoking status: Never   Smokeless tobacco: Never  Vaping Use   Vaping Use: Never used  Substance Use Topics  Alcohol use: Yes    Alcohol/week: 2.0 standard drinks    Types: 2 Standard drinks or equivalent per week    Comment: wine 1-2 x a week and not every week    Drug use: Never    Home Medications Prior to Admission medications   Medication Sig Start Date End Date Taking? Authorizing Provider  acetaminophen (TYLENOL) 500 MG tablet Take 1,000 mg by mouth in the morning and at bedtime.    [provider]  amLODipine (NORVASC) 10 MG tablet TAKE 1 TABLET BY MOUTH AT  BEDTIME 09/09/20   Janith Lima, MD  ascorbic acid (VITAMIN C) 1000 MG tablet Take 1,000 mg by mouth at bedtime.    [provider]  aspirin EC 81 MG tablet Take 81 mg by mouth daily. Swallow whole.    [provider]  atorvastatin (LIPITOR) 10 MG  tablet Take 1 tablet (10 mg total) by mouth daily. 01/26/21   Tomi Likens, Adam R, DO  fluticasone (FLONASE) 50 MCG/ACT nasal spray Place 1 spray into both nostrils daily. 04/03/20   Janith Lima, MD  losartan (COZAAR) 100 MG tablet TAKE 1 TABLET BY MOUTH  DAILY 01/21/21   Janith Lima, MD  olopatadine (PATANOL) 0.1 % ophthalmic solution Place 1 drop into both eyes at bedtime.    [provider]  omeprazole (PRILOSEC) 40 MG capsule TAKE 1 CAPSULE BY MOUTH  DAILY 01/14/21   Janith Lima, MD  psyllium (METAMUCIL) 58.6 % powder Take 1 packet by mouth at bedtime.    [provider]  tamsulosin (FLOMAX) 0.4 MG CAPS capsule Take 0.4 mg by mouth at bedtime. 09/17/19   [provider]    Allergies    Patient has no known allergies.  Review of Systems   Review of Systems  Constitutional:  Negative for chills and fever.  HENT:  Negative for congestion and facial swelling.   Eyes:  Positive for visual disturbance. Negative for discharge.  Respiratory:  Negative for shortness of breath.   Cardiovascular:  Negative for chest pain and palpitations.  Gastrointestinal:  Negative for abdominal pain, diarrhea and vomiting.  Musculoskeletal:  Negative for arthralgias and myalgias.  Skin:  Negative for color change and rash.  Neurological:  Negative for tremors, syncope and headaches.  Psychiatric/Behavioral:  Negative for confusion and dysphoric mood.    Physical Exam Updated Vital Signs BP (!) 165/90   Pulse 71   Temp 97.9 F (36.6 C) (Oral)   Resp (!) 22   SpO2 100%   Physical Exam Vitals and nursing note reviewed.  Constitutional:      Appearance: He is well-developed.  HENT:     Head: Normocephalic and atraumatic.  Eyes:     Pupils: Pupils are equal, round, and reactive to light.  Neck:     Vascular: No JVD.  Cardiovascular:     Rate and Rhythm: Normal rate and regular rhythm.     Heart sounds: No murmur heard.   No friction rub. No gallop.  Pulmonary:      Effort: No respiratory distress.     Breath sounds: No wheezing.  Abdominal:     General: There is no distension.     Tenderness: There is no abdominal tenderness. There is no guarding or rebound.  Musculoskeletal:        General: Normal range of motion.     Cervical back: Normal range of motion and neck supple.  Skin:    Coloration: Skin is not pale.  Findings: No rash.  Neurological:     Mental Status: He is alert and oriented to person, place, and time.     Cranial Nerves: Cranial nerves are intact.     Sensory: Sensation is intact.     Motor: Motor function is intact.     Coordination: Coordination is intact.     Comments: Benign neuro exam  Psychiatric:        Behavior: Behavior normal.    ED Results / Procedures / Treatments   Labs (all labs ordered are listed, but only abnormal results are displayed) Labs Reviewed  COMPREHENSIVE METABOLIC PANEL - Abnormal; Notable for the following components:      Result Value   Sodium 130 (*)    All other components within normal limits  PROTIME-INR  APTT  CBC  DIFFERENTIAL    EKG EKG Interpretation  Date/Time:  'Sunday February 04 2021 11:58:19 EDT Ventricular Rate:  69 PR Interval:  166 QRS Duration: 124 QT Interval:  398 QTC Calculation: 426 R Axis:   -63 Text Interpretation: Normal sinus rhythm Right bundle branch block Left anterior fascicular block  Bifascicular block  Minimal voltage criteria for LVH, may be normal variant ( R in aVL ) Possible Lateral infarct , age undetermined Abnormal ECG No significant change since last tracing Confirmed by ,  ([id number]) on 02/04/2021 1:55:00 PM  Radiology CT HEAD WO CONTRAST  Result Date: 02/04/2021 CLINICAL DATA:  Dizziness and change in vision. EXAM: CT HEAD WITHOUT CONTRAST TECHNIQUE: Contiguous axial images were obtained from the base of the skull through the vertex without intravenous contrast. COMPARISON:  Brain MRI July 16-2022 FINDINGS: Brain: No evidence of acute  infarction, hemorrhage, hydrocephalus, extra-axial collection or mass lesion/mass effect. Area of encephalomalacia in the right occipital lobe from prior known ischemic infarct. Similar small area of encephalomalacia in the posterior right frontal lobe. Deep white matter microangiopathy. Vascular: No hyperdense vessel or unexpected calcification. Skull: Normal. Negative for fracture or focal lesion. Sinuses/Orbits: Chronic sinusitis of the ethmoid, maxillary and frontal sinuses. Other: None. IMPRESSION: 1. No acute intracranial abnormality. 2. Encephalomalacia in the right occipital lobe from prior known ischemic infarct. 3. Chronic small area of encephalomalacia in the posterior right frontal lobe. 4. Deep white matter microangiopathy. 5. Chronic sinusitis. Electronically Signed   By: Dobrinka  Dimitrova M.D.   On: 02/04/2021 15:07    Procedures Procedures   Medications Ordered in ED Medications  sodium chloride flush (NS) 0.9 % injection 3 mL (has no administration in time range)  LORazepam (ATIVAN) injection 0.5 mg (has no administration in time range)    ED Course  I have reviewed the triage vital signs and the nursing notes.  Pertinent labs & imaging results that were available during my care of the patient were reviewed by me and considered in my medical decision making (see chart for details).    MDM Rules/Calculators/A&P                           80'$  yo M with a cc of L eye flashes like a kaleidoscope. Had symptoms like this previously and was diagnosed with a stroke.   CT head without obvious acute finding, will obtain MRI.   Signed out to Dr. Gilford Raid, please see their note for further details of care in the ED.  The patients results and plan were reviewed and discussed.   Any x-rays performed were independently reviewed by myself.   Differential diagnosis were  considered with the presenting HPI.  Medications  sodium chloride flush (NS) 0.9 % injection 3 mL (has no  administration in time range)  LORazepam (ATIVAN) injection 0.5 mg (has no administration in time range)    Vitals:   02/04/21 1200 02/04/21 1353 02/04/21 1500  BP: 139/76 (!) 151/80 (!) 165/90  Pulse: 72 (!) 59 71  Resp: 16 16 (!) 22  Temp: 97.9 F (36.6 C)    TempSrc: Oral    SpO2: 98% 99% 100%    Final diagnoses:  Visual disturbance     Final Clinical Impression(s) / ED Diagnoses Final diagnoses:  Visual disturbance    Rx / DC Orders ED Discharge Orders     None        Deno Etienne, DO 02/04/21 1547

## 2021-02-05 ENCOUNTER — Inpatient Hospital Stay (HOSPITAL_BASED_OUTPATIENT_CLINIC_OR_DEPARTMENT_OTHER): Payer: Medicare Other

## 2021-02-05 DIAGNOSIS — I6389 Other cerebral infarction: Secondary | ICD-10-CM

## 2021-02-05 DIAGNOSIS — E871 Hypo-osmolality and hyponatremia: Secondary | ICD-10-CM

## 2021-02-05 DIAGNOSIS — I63423 Cerebral infarction due to embolism of bilateral anterior cerebral arteries: Secondary | ICD-10-CM | POA: Diagnosis not present

## 2021-02-05 DIAGNOSIS — H539 Unspecified visual disturbance: Secondary | ICD-10-CM

## 2021-02-05 DIAGNOSIS — I639 Cerebral infarction, unspecified: Secondary | ICD-10-CM | POA: Diagnosis not present

## 2021-02-05 LAB — CBC
HCT: 36 % — ABNORMAL LOW (ref 39.0–52.0)
Hemoglobin: 12.2 g/dL — ABNORMAL LOW (ref 13.0–17.0)
MCH: 31.4 pg (ref 26.0–34.0)
MCHC: 33.9 g/dL (ref 30.0–36.0)
MCV: 92.8 fL (ref 80.0–100.0)
Platelets: 169 10*3/uL (ref 150–400)
RBC: 3.88 MIL/uL — ABNORMAL LOW (ref 4.22–5.81)
RDW: 12.2 % (ref 11.5–15.5)
WBC: 6.8 10*3/uL (ref 4.0–10.5)
nRBC: 0 % (ref 0.0–0.2)

## 2021-02-05 LAB — COMPREHENSIVE METABOLIC PANEL
ALT: 14 U/L (ref 0–44)
AST: 20 U/L (ref 15–41)
Albumin: 3.5 g/dL (ref 3.5–5.0)
Alkaline Phosphatase: 70 U/L (ref 38–126)
Anion gap: 9 (ref 5–15)
BUN: 16 mg/dL (ref 8–23)
CO2: 24 mmol/L (ref 22–32)
Calcium: 8.9 mg/dL (ref 8.9–10.3)
Chloride: 98 mmol/L (ref 98–111)
Creatinine, Ser: 1.04 mg/dL (ref 0.61–1.24)
GFR, Estimated: >60 mL/min (ref >60)
Glucose, Bld: 128 mg/dL — ABNORMAL HIGH (ref 70–99)
Potassium: 4 mmol/L (ref 3.5–5.1)
Sodium: 131 mmol/L — ABNORMAL LOW (ref 135–145)
Total Bilirubin: 1.1 mg/dL (ref 0.3–1.2)
Total Protein: 5.9 g/dL — ABNORMAL LOW (ref 6.5–8.1)

## 2021-02-05 LAB — LIPID PANEL
Cholesterol: 120 mg/dL (ref 0–200)
HDL: 64 mg/dL (ref >40)
LDL Cholesterol: 48 mg/dL (ref 0–99)
Total CHOL/HDL Ratio: 1.9 RATIO
Triglycerides: 38 mg/dL (ref <150)
VLDL: 8 mg/dL (ref 0–40)

## 2021-02-05 LAB — ECHOCARDIOGRAM COMPLETE
AR max vel: 1.97 cm2
AV Area VTI: 2.13 cm2
AV Area mean vel: 1.93 cm2
AV Mean grad: 3 mmHg
AV Peak grad: 5.9 mmHg
Ao pk vel: 1.21 m/s
Area-P 1/2: 1.82 cm2
MV VTI: 1.67 cm2
S' Lateral: 4.3 cm

## 2021-02-05 MED ORDER — PERFLUTREN LIPID MICROSPHERE
1.0000 mL | INTRAVENOUS | Status: AC | PRN
  Administered 2021-02-05: 3 mL via INTRAVENOUS
  Filled 2021-02-05: qty 10

## 2021-02-05 NOTE — Discharge Summary (Signed)
Physician Discharge Summary  Kenneth Montoya [id number] DOB: 02/08/1941 DOA: 02/04/2021  PCP: Janith Lima, MD  Admit date: 02/04/2021 Discharge date: 02/05/2021  Admitted From: Home Disposition:  Home  Recommendations for Outpatient Follow-up:  Follow up with PCP in 1-2 weeks Follow up with Neurology in 2 months  Discharge Condition:Stable CODE STATUS:Full Diet recommendation: Heart healthy   Brief/Interim Summary: 80 y.o. male, with medical history of hypertension, osteoarthritis, SIADH, bilateral knee replacement, recent stroke in July 2022, at that time MRI brain showed acute right middle occipital infarct as well as extensive chronic small vessel ischemic changes.  Echocardiogram was not performed at time as patient desired to be discharged.  He was discharged on dual antiplatelet therapy with Plavix and aspirin for 21 days and later aspirin alone as well as started on atorvastatin 40 mg daily.  Patient discontinued atorvastatin because he felt dizzy and weak.  Dose of atorvastatin was reduced to 10 mg daily due to side effects. This morning patient said that he had visual symptoms, which started this morning.  Patient is a poor historian, does not member exact time on the symptoms started.  He denies slurred speech, no one-sided numbness or weakness of extremities.  At this time visual symptoms have resolved.  Discharge Diagnoses:  Active Problems:   Stroke [facility])   Stroke (cerebrum) (HCC)   Stroke MRI brain notable for acute/subacute infarct right frontal cortex, punctate acute infarct in the left frontal cortex.   He was already on aspirin and Plavix for 3 weeks and was switched to aspirin alone recently. Pt seen by Neurology recommendation to complete home cardiac monitoring and to f/u with Cardiology Neurology recommends ASA and Plavix x 3 weeks then plavix alone Per Neurology, OK for d/c today Hyperlipidemia Continued on atorvastatin 10 mg daily Hypertension Initially  held amlodipine, Cozaar for permissive hypertension. Cont home meds on d/c BPH Cont home meds on d/c   Discharge Instructions  Discharge Instructions     Ambulatory referral to Neurology   Complete by: As directed    An appointment is requested in approximately: 8 weeks, for stroke hospital f/u      Allergies as of 02/05/2021   No Known Allergies      Medication List     TAKE these medications    acetaminophen 500 MG tablet Commonly known as: TYLENOL Take 1,000 mg by mouth in the morning and at bedtime.   amLODipine 10 MG tablet Commonly known as: NORVASC TAKE 1 TABLET BY MOUTH AT  BEDTIME What changed: when to take this   ascorbic acid 1000 MG tablet Commonly known as: VITAMIN C Take 1,000 mg by mouth at bedtime.   aspirin EC 81 MG tablet Take 81 mg by mouth daily. Swallow whole.   atorvastatin 10 MG tablet Commonly known as: Lipitor Take 1 tablet (10 mg total) by mouth daily.   clopidogrel 75 MG tablet Commonly known as: PLAVIX Take 75 mg by mouth daily.   losartan 100 MG tablet Commonly known as: COZAAR TAKE 1 TABLET BY MOUTH  DAILY   olopatadine 0.1 % ophthalmic solution Commonly known as: PATANOL Place 1 drop into both eyes at bedtime.   omeprazole 40 MG capsule Commonly known as: PRILOSEC TAKE 1 CAPSULE BY MOUTH  DAILY What changed:  how much to take how to take this when to take this additional instructions   psyllium 58.6 % powder Commonly known as: METAMUCIL Take 1 packet by mouth at bedtime.   tamsulosin 0.4 MG  Caps capsule Commonly known as: FLOMAX Take 0.4 mg by mouth at bedtime.        Follow-up Information     Janith Lima, MD Follow up in 2 week(s).   Specialty: Internal Medicine Why: Hospital follow up Contact information: [place] [address] [phone]         Garvin Fila, MD Follow up.   Specialties: Neurology, Radiology Why: as scheduled, Hospital follow up Contact  information: [address] [place] [address] [phone]                No Known Allergies  Consultations: Neurology  Procedures/Studies: CT HEAD WO CONTRAST  Result Date: 02/04/2021 CLINICAL DATA:  Dizziness and change in vision. EXAM: CT HEAD WITHOUT CONTRAST TECHNIQUE: Contiguous axial images were obtained from the base of the skull through the vertex without intravenous contrast. COMPARISON:  Brain MRI July 16-2022 FINDINGS: Brain: No evidence of acute infarction, hemorrhage, hydrocephalus, extra-axial collection or mass lesion/mass effect. Area of encephalomalacia in the right occipital lobe from prior known ischemic infarct. Similar small area of encephalomalacia in the posterior right frontal lobe. Deep white matter microangiopathy. Vascular: No hyperdense vessel or unexpected calcification. Skull: Normal. Negative for fracture or focal lesion. Sinuses/Orbits: Chronic sinusitis of the ethmoid, maxillary and frontal sinuses. Other: None. IMPRESSION: 1. No acute intracranial abnormality. 2. Encephalomalacia in the right occipital lobe from prior known ischemic infarct. 3. Chronic small area of encephalomalacia in the posterior right frontal lobe. 4. Deep white matter microangiopathy. 5. Chronic sinusitis. Electronically Signed   By: Fidela Salisbury M.D.   On: 02/04/2021 15:07   MR BRAIN WO CONTRAST  Result Date: 02/04/2021 CLINICAL DATA:  Neuro deficit, acute, stroke suspected EXAM: MRI HEAD WITHOUT CONTRAST TECHNIQUE: Multiplanar, multiecho pulse sequences of the brain and surrounding structures were obtained without intravenous contrast. COMPARISON:  Same day head CT. FINDINGS: Mildly motion limited study. Brain: Small acute or subacute infarct in the right frontal cortex (series 2, image 36) and punctate acute infarct in the left frontal cortex (series 2, image 38) no evidence of acute hemorrhage, hydrocephalus, mass lesion, midline shift, or extra-axial fluid  collection. Encephalomalacia in the medial right occipital lobe, compatible with sequela of previously seen infarct. Some curvilinear T2 hypointensity and susceptibility artifact in this region likely is the sequela of prior hemorrhage. No correlate hyperdensity on same day head CT to suggest acute hemorrhage. Additional remote infarct in the left medial occipital lobe. Additional scattered moderate to advanced T2 hyperintensities within the white matter, nonspecific but compatible with chronic microvascular ischemic disease. Small remote lacunar infarcts in the cerebellum bilaterally. Vascular: Major arterial flow voids are maintained at the skull base. Skull and upper cervical spine: Normal marrow signal. Sinuses/Orbits: Moderate paranasal sinus mucosal thickening. Other: No sizable mastoid effusions. IMPRESSION: 1. Small probable acute or subacute infarct in the right frontal cortex and punctate acute infarct in the left frontal cortex 2. Remote bilateral occipital lobe infarcts with evidence of prior hemorrhage on the right. 3. Small remote bilateral cerebellar lacunar infarcts. 4. Moderate to advanced chronic microvascular ischemic disease. 5. Moderate paranasal sinus mucosal thickening. Electronically Signed   By: Margaretha Sheffield M.D.   On: 02/04/2021 18:43   ECHOCARDIOGRAM COMPLETE  Result Date: 02/05/2021    ECHOCARDIOGRAM REPORT   Patient Name:   Kenneth Montoya Date of Exam: 02/05/2021 Medical Rec #:  [id number]        Height:       69.0  in Accession #:    [id number]       Weight:       193.0 lb Date of Birth:  August 21, 1940         BSA:          2.035 m Patient Age:    56 years         BP:           140/67 mmHg Patient Gender: M                HR:           62 bpm. Exam Location:  Inpatient Procedure: 2D Echo, Cardiac Doppler, Color Doppler and Intracardiac            Opacification Agent Indications:    Stroke  History:        Patient has no prior history of Echocardiogram examinations.                  Risk Factors:Hypertension. H/o COVID-19. H/o stroke.  Sonographer:    Clayton Lefort RDCS (AE) Referring Phys: Earnie Larsson LAMA  Sonographer Comments: Technically difficult study due to poor echo windows. IMPRESSIONS  1. No apical thrombus by Definity contrast. Left ventricular ejection fraction, by estimation, is 60 to 65%. The left ventricle has normal function. The left ventricle has no regional wall motion abnormalities. There is moderate left ventricular hypertrophy. Left ventricular diastolic parameters are consistent with Grade I diastolic dysfunction (impaired relaxation).  2. Right ventricular systolic function is mildly reduced. The right ventricular size is normal.  3. The mitral valve was not well visualized. No evidence of mitral valve regurgitation.  4. The aortic valve is tricuspid. Aortic valve regurgitation is not visualized. Comparison(s): No prior Echocardiogram. FINDINGS  Left Ventricle: No apical thrombus by Definity contrast. Left ventricular ejection fraction, by estimation, is 60 to 65%. The left ventricle has normal function. The left ventricle has no regional wall motion abnormalities. Definity contrast agent was given IV to delineate the left ventricular endocardial borders. The left ventricular internal cavity size was normal in size. There is moderate left ventricular hypertrophy. Abnormal (paradoxical) septal motion, consistent with left bundle branch block. Left ventricular diastolic parameters are consistent with Grade I diastolic dysfunction (impaired relaxation). Indeterminate filling pressures. Right Ventricle: The right ventricular size is normal. No increase in right ventricular wall thickness. Right ventricular systolic function is mildly reduced. Left Atrium: Left atrial size was normal in size. Right Atrium: Right atrial size was normal in size. Pericardium: There is no evidence of pericardial effusion. Mitral Valve: The mitral valve was not well visualized. No evidence of mitral  valve regurgitation. MV peak gradient, 5.5 mmHg. The mean mitral valve gradient is 1.0 mmHg. Tricuspid Valve: The tricuspid valve is not well visualized. Tricuspid valve regurgitation is not demonstrated. Aortic Valve: The aortic valve is tricuspid. Aortic valve regurgitation is not visualized. Aortic valve mean gradient measures 3.0 mmHg. Aortic valve peak gradient measures 5.9 mmHg. Aortic valve area, by VTI measures 2.13 cm. Pulmonic Valve: The pulmonic valve was normal in structure. Pulmonic valve regurgitation is not visualized. Aorta: The aortic root and ascending aorta are structurally normal, with no evidence of dilitation. IAS/Shunts: No atrial level shunt detected by color flow Doppler.  LEFT VENTRICLE PLAX 2D LVIDd:         5.00 cm  Diastology LVIDs:         4.30 cm  LV e' medial:    3.99 cm/s LV PW:  1.30 cm  LV E/e' medial:  13.8 LV IVS:        1.50 cm  LV e' lateral:   7.49 cm/s LVOT diam:     1.90 cm  LV E/e' lateral: 7.3 LV SV:         54 LV SV Index:   26 LVOT Area:     2.84 cm  RIGHT VENTRICLE RV Basal diam:  2.50 cm RV S prime:     8.93 cm/s TAPSE (M-mode): 2.8 cm LEFT ATRIUM           Index       RIGHT ATRIUM           Index LA diam:      3.10 cm 1.52 cm/m  RA Area:     14.30 cm LA Vol (A4C): 34.2 ml 16.81 ml/m RA Volume:   35.00 ml  17.20 ml/m  AORTIC VALVE AV Area (Vmax):    1.97 cm AV Area (Vmean):   1.93 cm AV Area (VTI):     2.13 cm AV Vmax:           121.00 cm/s AV Vmean:          81.600 cm/s AV VTI:            0.251 m AV Peak Grad:      5.9 mmHg AV Mean Grad:      3.0 mmHg LVOT Vmax:         84.20 cm/s LVOT Vmean:        55.500 cm/s LVOT VTI:          0.189 m LVOT/AV VTI ratio: 0.75  AORTA Ao Root diam: 3.70 cm MITRAL VALVE MV Area (PHT): 1.82 cm     SHUNTS MV Area VTI:   1.67 cm     Systemic VTI:  0.19 m MV Peak grad:  5.5 mmHg     Systemic Diam: 1.90 cm MV Mean grad:  1.0 mmHg MV Vmax:       1.17 m/s MV Vmean:      51.3 cm/s MV Decel Time: 416 msec MV E velocity: 55.00  cm/s MV A velocity: 113.00 cm/s MV E/A ratio:  0.49 Lyman Bishop MD Electronically signed by Lyman Bishop MD Signature Date/Time: 02/05/2021/2:09:28 PM    Final     Subjective: Eager to go home  Discharge Exam: Vitals:   02/05/21 1800 02/05/21 1827  BP:  (!) 144/88  Pulse: 74 76  Resp:  18  Temp:  98.4 F (36.9 C)  SpO2: 98% 100%   Vitals:   02/05/21 1200 02/05/21 1530 02/05/21 1800 02/05/21 1827  BP: (!) 176/85 (!) 150/70  (!) 144/88  Pulse: 73 75 74 76  Resp: '18 16  18  '$ Temp:    98.4 F (36.9 C)  TempSrc:    Oral  SpO2: 98% 99% 98% 100%    General: Pt is alert, awake, not in acute distress Cardiovascular: RRR, S1/S2 + Respiratory: CTA bilaterally, no wheezing, no rhonchi Abdominal: Soft, NT, ND, bowel sounds + Extremities: no edema, no cyanosis   The results of significant diagnostics from this hospitalization (including imaging, microbiology, ancillary and laboratory) are listed below for reference.     Microbiology: Recent Results (from the past 240 hour(s))  Resp Panel by RT-PCR (Flu A&B, Covid) Nasopharyngeal Swab     Status: None   Collection Time: 02/04/21  7:38 PM   Specimen: Nasopharyngeal Swab; Nasopharyngeal(NP) swabs in vial transport medium  Result Value  Ref Range Status   SARS Coronavirus 2 by RT PCR NEGATIVE NEGATIVE Final    Comment: (NOTE) SARS-CoV-2 target nucleic acids are NOT DETECTED.  The SARS-CoV-2 RNA is generally detectable in upper respiratory specimens during the acute phase of infection. The lowest concentration of SARS-CoV-2 viral copies this assay can detect is 138 copies/mL. A negative result does not preclude SARS-Cov-2 infection and should not be used as the sole basis for treatment or other patient management decisions. A negative result may occur with  improper specimen collection/handling, submission of specimen other than nasopharyngeal swab, presence of viral mutation(s) within the areas targeted by this assay, and  inadequate number of viral copies(<138 copies/mL). A negative result must be combined with clinical observations, patient history, and epidemiological information. The expected result is Negative.  Fact Sheet for Patients:  EntrepreneurPulse.com.au  Fact Sheet for Healthcare Providers:  IncredibleEmployment.be  This test is no t yet approved or cleared by the [place] FDA and  has been authorized for detection and/or diagnosis of SARS-CoV-2 by FDA under an Emergency Use Authorization (EUA). This EUA will remain  in effect (meaning this test can be used) for the duration of the COVID-19 declaration under Section 564(b)(1) of the Act, 21 U.S.C.section 360bbb-3(b)(1), unless the authorization is terminated  or revoked sooner.       Influenza A by PCR NEGATIVE NEGATIVE Final   Influenza B by PCR NEGATIVE NEGATIVE Final    Comment: (NOTE) The Xpert Xpress SARS-CoV-2/FLU/RSV plus assay is intended as an aid in the diagnosis of influenza from Nasopharyngeal swab specimens and should not be used as a sole basis for treatment. Nasal washings and aspirates are unacceptable for Xpert Xpress SARS-CoV-2/FLU/RSV testing.  Fact Sheet for Patients: EntrepreneurPulse.com.au  Fact Sheet for Healthcare Providers: IncredibleEmployment.be  This test is not yet approved or cleared by the [place] FDA and has been authorized for detection and/or diagnosis of SARS-CoV-2 by FDA under an Emergency Use Authorization (EUA). This EUA will remain in effect (meaning this test can be used) for the duration of the COVID-19 declaration under Section 564(b)(1) of the Act, 21 U.S.C. section 360bbb-3(b)(1), unless the authorization is terminated or revoked.  Performed at [facility], [place] [address] [place], [place] [address]      Labs: BNP (last 3 results) Recent Labs    12/09/20 2226  BNP [id number]   Basic  Metabolic Panel: Recent Labs  Lab 02/04/21 1240 02/05/21 0426  NA 130* 131*  K 4.4 4.0  CL 99 98  CO2 26 24  GLUCOSE 96 128*  BUN 17 16  CREATININE 1.04 1.04  CALCIUM 9.0 8.9   Liver Function Tests: Recent Labs  Lab 02/04/21 1240 02/05/21 0426  AST 23 20  ALT 16 14  ALKPHOS 78 70  BILITOT 1.0 1.1  PROT 6.8 5.9*  ALBUMIN 4.1 3.5   No results for input(s): LIPASE, AMYLASE in the last 168 hours. No results for input(s): AMMONIA in the last 168 hours. CBC: Recent Labs  Lab 02/04/21 1240 02/05/21 0426  WBC 6.2 6.8  NEUTROABS 3.8  --   HGB 13.4 12.2*  HCT 39.1 36.0*  MCV 92.4 92.8  PLT 191 169   Cardiac Enzymes: No results for input(s): CKTOTAL, CKMB, CKMBINDEX, TROPONINI in the last 168 hours. BNP: Invalid input(s): POCBNP CBG: No results for input(s): GLUCAP in the last 168 hours. D-Dimer No results for input(s): DDIMER in the last 72 hours. Hgb A1c No results for input(s): HGBA1C in the  last 72 hours. Lipid Profile Recent Labs    02/05/21 0426  CHOL 120  HDL 64  LDLCALC 48  TRIG 38  CHOLHDL 1.9   Thyroid function studies No results for input(s): TSH, T4TOTAL, T3FREE, THYROIDAB in the last 72 hours.  Invalid input(s): FREET3 Anemia work up No results for input(s): VITAMINB12, FOLATE, FERRITIN, TIBC, IRON, RETICCTPCT in the last 72 hours. Urinalysis    Component Value Date/Time   COLORURINE YELLOW 12/15/2020 [place] 12/15/2020 0944   LABSPEC 1.010 12/15/2020 0944   PHURINE 7.0 12/15/2020 0944   GLUCOSEU NEGATIVE 12/15/2020 0944   HGBUR NEGATIVE 12/15/2020 0944   BILIRUBINUR NEGATIVE 12/15/2020 0944   KETONESUR NEGATIVE 12/15/2020 0944   UROBILINOGEN 0.2 12/15/2020 0944   NITRITE NEGATIVE 12/15/2020 0944   LEUKOCYTESUR NEGATIVE 12/15/2020 0944   Sepsis Labs Invalid input(s): PROCALCITONIN,  WBC,  LACTICIDVEN Microbiology Recent Results (from the past 240 hour(s))  Resp Panel by RT-PCR (Flu A&B, Covid) Nasopharyngeal Swab      Status: None   Collection Time: 02/04/21  7:38 PM   Specimen: Nasopharyngeal Swab; Nasopharyngeal(NP) swabs in vial transport medium  Result Value Ref Range Status   SARS Coronavirus 2 by RT PCR NEGATIVE NEGATIVE Final    Comment: (NOTE) SARS-CoV-2 target nucleic acids are NOT DETECTED.  The SARS-CoV-2 RNA is generally detectable in upper respiratory specimens during the acute phase of infection. The lowest concentration of SARS-CoV-2 viral copies this assay can detect is 138 copies/mL. A negative result does not preclude SARS-Cov-2 infection and should not be used as the sole basis for treatment or other patient management decisions. A negative result may occur with  improper specimen collection/handling, submission of specimen other than nasopharyngeal swab, presence of viral mutation(s) within the areas targeted by this assay, and inadequate number of viral copies(<138 copies/mL). A negative result must be combined with clinical observations, patient history, and epidemiological information. The expected result is Negative.  Fact Sheet for Patients:  EntrepreneurPulse.com.au  Fact Sheet for Healthcare Providers:  IncredibleEmployment.be  This test is no t yet approved or cleared by the [place] FDA and  has been authorized for detection and/or diagnosis of SARS-CoV-2 by FDA under an Emergency Use Authorization (EUA). This EUA will remain  in effect (meaning this test can be used) for the duration of the COVID-19 declaration under Section 564(b)(1) of the Act, 21 U.S.C.section 360bbb-3(b)(1), unless the authorization is terminated  or revoked sooner.       Influenza A by PCR NEGATIVE NEGATIVE Final   Influenza B by PCR NEGATIVE NEGATIVE Final    Comment: (NOTE) The Xpert Xpress SARS-CoV-2/FLU/RSV plus assay is intended as an aid in the diagnosis of influenza from Nasopharyngeal swab specimens and should not be used as a sole  basis for treatment. Nasal washings and aspirates are unacceptable for Xpert Xpress SARS-CoV-2/FLU/RSV testing.  Fact Sheet for Patients: EntrepreneurPulse.com.au  Fact Sheet for Healthcare Providers: IncredibleEmployment.be  This test is not yet approved or cleared by the [place] FDA and has been authorized for detection and/or diagnosis of SARS-CoV-2 by FDA under an Emergency Use Authorization (EUA). This EUA will remain in effect (meaning this test can be used) for the duration of the COVID-19 declaration under Section 564(b)(1) of the Act, 21 U.S.C. section 360bbb-3(b)(1), unless the authorization is terminated or revoked.  Performed at [facility], [place] [address] [place], [place] [address]    Time spent: 30 min  SIGNED:   Marylu Lund, MD  [facility] 02/05/2021, 6:36 PM  If 7PM-7AM, please contact night-coverage

## 2021-02-05 NOTE — Progress Notes (Addendum)
STROKE TEAM PROGRESS NOTE   INTERVAL HISTORY  Pleasant 80 year old right handed white male with history of hypertension, SIADH and recent right occipital stroke with residual left sided visual deficit, presented for evaluation of imbalance.  MRI of the brain shows punctate areas of restricted diffusion in bilateral frontal lobes raising concern for an embolic etiology. His wife is at the bedside.  He is pleasant and cooperative, eager for discharge.   Vitals:   02/05/21 0330 02/05/21 0700 02/05/21 0730 02/05/21 1108  BP: (!) 143/94 119/74 140/83 140/75  Pulse: 83 70 67 67  Resp: '16 16 18 18  '$ Temp: 97.7 F (36.5 C)     TempSrc:      SpO2: 98% 97% 98% 99%   CBC:  Recent Labs  Lab 02/04/21 1240 02/05/21 0426  WBC 6.2 6.8  NEUTROABS 3.8  --   HGB 13.4 12.2*  HCT 39.1 36.0*  MCV 92.4 92.8  PLT 191 [id number]   Basic Metabolic Panel:  Recent Labs  Lab 02/04/21 1240 02/05/21 0426  NA 130* 131*  K 4.4 4.0  CL 99 98  CO2 26 24  GLUCOSE 96 128*  BUN 17 16  CREATININE 1.04 1.04  CALCIUM 9.0 8.9    Lipid Panel: No results for input(s): CHOL, TRIG, HDL, CHOLHDL, VLDL, LDLCALC in the last 168 hours.  HgbA1c: No results for input(s): HGBA1C in the last 168 hours. Urine Drug Screen: No results for input(s): LABOPIA, COCAINSCRNUR, LABBENZ, AMPHETMU, THCU, LABBARB in the last 168 hours.  Alcohol Level No results for input(s): ETH in the last 168 hours.  IMAGING past 24 hours CT HEAD WO CONTRAST  Result Date: 02/04/2021 CLINICAL DATA:  Dizziness and change in vision. EXAM: CT HEAD WITHOUT CONTRAST TECHNIQUE: Contiguous axial images were obtained from the base of the skull through the vertex without intravenous contrast. COMPARISON:  Brain MRI July 16-2022 FINDINGS: Brain: No evidence of acute infarction, hemorrhage, hydrocephalus, extra-axial collection or mass lesion/mass effect. Area of encephalomalacia in the right occipital lobe from prior known ischemic infarct. Similar small area of  encephalomalacia in the posterior right frontal lobe. Deep white matter microangiopathy. Vascular: No hyperdense vessel or unexpected calcification. Skull: Normal. Negative for fracture or focal lesion. Sinuses/Orbits: Chronic sinusitis of the ethmoid, maxillary and frontal sinuses. Other: None. IMPRESSION: 1. No acute intracranial abnormality. 2. Encephalomalacia in the right occipital lobe from prior known ischemic infarct. 3. Chronic small area of encephalomalacia in the posterior right frontal lobe. 4. Deep white matter microangiopathy. 5. Chronic sinusitis. Electronically Signed   By: Fidela Salisbury M.D.   On: 02/04/2021 15:07   MR BRAIN WO CONTRAST  Result Date: 02/04/2021 CLINICAL DATA:  Neuro deficit, acute, stroke suspected EXAM: MRI HEAD WITHOUT CONTRAST TECHNIQUE: Multiplanar, multiecho pulse sequences of the brain and surrounding structures were obtained without intravenous contrast. COMPARISON:  Same day head CT. FINDINGS: Mildly motion limited study. Brain: Small acute or subacute infarct in the right frontal cortex (series 2, image 36) and punctate acute infarct in the left frontal cortex (series 2, image 38) no evidence of acute hemorrhage, hydrocephalus, mass lesion, midline shift, or extra-axial fluid collection. Encephalomalacia in the medial right occipital lobe, compatible with sequela of previously seen infarct. Some curvilinear T2 hypointensity and susceptibility artifact in this region likely is the sequela of prior hemorrhage. No correlate hyperdensity on same day head CT to suggest acute hemorrhage. Additional remote infarct in the left medial occipital lobe. Additional scattered moderate to advanced T2 hyperintensities within the  white matter, nonspecific but compatible with chronic microvascular ischemic disease. Small remote lacunar infarcts in the cerebellum bilaterally. Vascular: Major arterial flow voids are maintained at the skull base. Skull and upper cervical spine: Normal  marrow signal. Sinuses/Orbits: Moderate paranasal sinus mucosal thickening. Other: No sizable mastoid effusions. IMPRESSION: 1. Small probable acute or subacute infarct in the right frontal cortex and punctate acute infarct in the left frontal cortex 2. Remote bilateral occipital lobe infarcts with evidence of prior hemorrhage on the right. 3. Small remote bilateral cerebellar lacunar infarcts. 4. Moderate to advanced chronic microvascular ischemic disease. 5. Moderate paranasal sinus mucosal thickening. Electronically Signed   By: Margaretha Sheffield M.D.   On: 02/04/2021 18:43   ECHOCARDIOGRAM COMPLETE  Result Date: 02/05/2021    ECHOCARDIOGRAM REPORT   Patient Name:   Kenneth Montoya Date of Exam: 02/05/2021 Medical Rec #:  [id number]        Height:       69.0 in Accession #:    [id number]       Weight:       193.0 lb Date of Birth:  01-22-1941         BSA:          2.035 m Patient Age:    87 years         BP:           140/67 mmHg Patient Gender: M                HR:           62 bpm. Exam Location:  Inpatient Procedure: 2D Echo, Cardiac Doppler, Color Doppler and Intracardiac            Opacification Agent Indications:    Stroke  History:        Patient has no prior history of Echocardiogram examinations.                 Risk Factors:Hypertension. H/o COVID-19. H/o stroke.  Sonographer:    Clayton Lefort RDCS (AE) Referring Phys: Earnie Larsson LAMA  Sonographer Comments: Technically difficult study due to poor echo windows. IMPRESSIONS  1. No apical thrombus by Definity contrast. Left ventricular ejection fraction, by estimation, is 60 to 65%. The left ventricle has normal function. The left ventricle has no regional wall motion abnormalities. There is moderate left ventricular hypertrophy. Left ventricular diastolic parameters are consistent with Grade I diastolic dysfunction (impaired relaxation).  2. Right ventricular systolic function is mildly reduced. The right ventricular size is normal.  3. The mitral valve  was not well visualized. No evidence of mitral valve regurgitation.  4. The aortic valve is tricuspid. Aortic valve regurgitation is not visualized. Comparison(s): No prior Echocardiogram. FINDINGS  Left Ventricle: No apical thrombus by Definity contrast. Left ventricular ejection fraction, by estimation, is 60 to 65%. The left ventricle has normal function. The left ventricle has no regional wall motion abnormalities. Definity contrast agent was given IV to delineate the left ventricular endocardial borders. The left ventricular internal cavity size was normal in size. There is moderate left ventricular hypertrophy. Abnormal (paradoxical) septal motion, consistent with left bundle branch block. Left ventricular diastolic parameters are consistent with Grade I diastolic dysfunction (impaired relaxation). Indeterminate filling pressures. Right Ventricle: The right ventricular size is normal. No increase in right ventricular wall thickness. Right ventricular systolic function is mildly reduced. Left Atrium: Left atrial size was normal in size. Right Atrium: Right atrial size was normal in size. Pericardium:  There is no evidence of pericardial effusion. Mitral Valve: The mitral valve was not well visualized. No evidence of mitral valve regurgitation. MV peak gradient, 5.5 mmHg. The mean mitral valve gradient is 1.0 mmHg. Tricuspid Valve: The tricuspid valve is not well visualized. Tricuspid valve regurgitation is not demonstrated. Aortic Valve: The aortic valve is tricuspid. Aortic valve regurgitation is not visualized. Aortic valve mean gradient measures 3.0 mmHg. Aortic valve peak gradient measures 5.9 mmHg. Aortic valve area, by VTI measures 2.13 cm. Pulmonic Valve: The pulmonic valve was normal in structure. Pulmonic valve regurgitation is not visualized. Aorta: The aortic root and ascending aorta are structurally normal, with no evidence of dilitation. IAS/Shunts: No atrial level shunt detected by color flow  Doppler.  LEFT VENTRICLE PLAX 2D LVIDd:         5.00 cm  Diastology LVIDs:         4.30 cm  LV e' medial:    3.99 cm/s LV PW:         1.30 cm  LV E/e' medial:  13.8 LV IVS:        1.50 cm  LV e' lateral:   7.49 cm/s LVOT diam:     1.90 cm  LV E/e' lateral: 7.3 LV SV:         54 LV SV Index:   26 LVOT Area:     2.84 cm  RIGHT VENTRICLE RV Basal diam:  2.50 cm RV S prime:     8.93 cm/s TAPSE (M-mode): 2.8 cm LEFT ATRIUM           Index       RIGHT ATRIUM           Index LA diam:      3.10 cm 1.52 cm/m  RA Area:     14.30 cm LA Vol (A4C): 34.2 ml 16.81 ml/m RA Volume:   35.00 ml  17.20 ml/m  AORTIC VALVE AV Area (Vmax):    1.97 cm AV Area (Vmean):   1.93 cm AV Area (VTI):     2.13 cm AV Vmax:           121.00 cm/s AV Vmean:          81.600 cm/s AV VTI:            0.251 m AV Peak Grad:      5.9 mmHg AV Mean Grad:      3.0 mmHg LVOT Vmax:         84.20 cm/s LVOT Vmean:        55.500 cm/s LVOT VTI:          0.189 m LVOT/AV VTI ratio: 0.75  AORTA Ao Root diam: 3.70 cm MITRAL VALVE MV Area (PHT): 1.82 cm     SHUNTS MV Area VTI:   1.67 cm     Systemic VTI:  0.19 m MV Peak grad:  5.5 mmHg     Systemic Diam: 1.90 cm MV Mean grad:  1.0 mmHg MV Vmax:       1.17 m/s MV Vmean:      51.3 cm/s MV Decel Time: 416 msec MV E velocity: 55.00 cm/s MV A velocity: 113.00 cm/s MV E/A ratio:  0.49 Lyman Bishop MD Electronically signed by Lyman Bishop MD Signature Date/Time: 02/05/2021/2:09:28 PM    Final     PHYSICAL EXAM   Neurological exam Awake alert oriented x3 No dysarthria.  No aphasia. 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 Motor examination with no drift. Sensation intact to light touch without extinction Coordination: With no dysmetria NIH stroke scale-1 for visual  ASSESSMENT/PLAN Kenneth Montoya is a 80 y.o. male with history of   hypertension, SIADH, recent  right occipital stroke with some residual left-sided visual deficit-which happened in the setting of COVID positivity with unclear etiology of the stroke, presenting to the emergency room for evaluation of dizziness-which she says was more of imbalance and not lightheadedness this morning.  Last known well was sometime around 11:30 PM last night when he went to bed.  Upon waking up this morning he felt rather unsteady and also felt that he might have had some visual disturbance similar to what he had with a stroke in July. He followed up with Dr. Marland Mcalpine completed 3 weeks of dual antiplatelets and was converted to 1 antiplatelet-aspirin only.  He had refused longer-term cardiac monitoring and also had left the hospital before an echocardiogram could have been completed. Today, on imaging due to his presentation with strokelike symptoms, MRI of the brain shows punctate areas of restricted diffusion in bilateral frontal lobes raising concern for an embolic etiology. He did agree to getting a 30-day heart monitor from Dr. Tomi Likens which was delivered to his house a couple of days ago but he has not started to monitor his heart rhythm yet. He continues to drive but reports that he does have a hard time seeing cars coming from the left side.  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   Chart review reveals that the patient was not very willing to stay in for a full work-up during his last admission in July and did not cooperate with the care team to understand the rationale for the work-up.   Plan for 30 day loop recorder given multiplicity of strokes.   Stroke:  bilateral frontal lobe infarct , probably cardioembolic  Code Stroke  CT head No acute abnormality.  Small vessel disease. Atrophy.  ASPECTS 10.     MRI BRAIN: 1. Small probable acute or subacute infarct in the right frontal cortex and punctate acute infarct  in the left frontal cortex 2. Remote bilateral occipital lobe infarcts with evidence of prior hemorrhage on the right. 3. Small remote bilateral cerebellar lacunar infarcts. 4. Moderate to advanced chronic microvascular ischemic disease.  MRA  HEAD:  1. Negative intracranial MRA for large vessel occlusion. 2. Severe distal left P2 and right P3 stenoses. 3. Otherwise wide patency of the major intracranial arterial circulation. No proximal high-grade or correctable stenosis.  MRA NECK: 1. Short-segment moderate stenosis of up to 40-50% about the proximal right ICA. 2. No significant stenosis involving the left carotid artery system. 3. Both vertebral arteries widely patent within the neck. Right vertebral artery dominant.  2D Echo: LVEF 60-65%, normal size LA, no IA shunt   LDL 50 HgbA1c 5.8 VTE prophylaxis - scd    Diet   Diet Heart Room service appropriate? Yes; Fluid consistency: Thin   aspirin 81 mg daily prior to admission, now on aspirin 81 mg daily and clopidogrel 75 mg daily.   Therapy recommendations:  pending Disposition:  pending  Hypertension Home meds:  amlodipine, losartan Stable Permissive hypertension (OK if < 220/120) but gradually normalize in 5-7 days Long-term BP goal normotensive  Hyperlipidemia Home meds:  lipitor '10mg'$ ,  resumed in hospital LDL 50, goal < 70 Continue statin at discharge  HgbA1c 5.8, goal < 7.0 CBGs No results for input(s): GLUCAP in the last 72 hours.  SSI  Other Stroke Risk Factors  Advanced Age >/= 78       Obesity, There is no height or weight on file to calculate BMI., BMI >/= 30 associated with increased stroke risk, recommend weight loss, diet and exercise as appropriate   Hx stroke/TIA  Other Active Problems BPH  Hospital day # 1   I have personally obtained history,examined this patient, reviewed notes, independently viewed imaging studies, participated in medical decision making and plan of care.ROS completed by  me personally and pertinent positives fully documented  I have made any additions or clarifications directly to the above note. Agree with note above.  Patient has presented with recurrent stroke within 2 months and during the previous admission he had refused to optimal stroke work-up but now is more agreeable.  Check echocardiogram results and recommend prolonged cardiac monitoring for paroxysmal A. fib.  Patient already has 30-day heart monitor which he has not yet started using.  Discussed with electro physiology team and they will follow that and if negative will do an outpatient loop recorder.  Patient wants to go home and is agreeable with the plan.  Patient counseled to be compliant with antiplatelet therapy and recommend aspirin and Plavix for 3 weeks followed by Plavix alone and aggressive risk factor modification.  Follow-up as an outpatient stroke clinic in 2 months.  Greater than 50% time during this 35-minute visit was spent in counseling and coordination of care about his embolic stroke and discussion about evaluation treatment and answering questions.  Discussed with patient and Dr. Wyline Copas and the EP team  Antony Contras, [place] Pager: [phone] 02/05/2021 5:57 PM   To contact Stroke Continuity provider, please refer to [email or web address] After hours, contact General Neurology

## 2021-02-05 NOTE — Care Management Obs Status (Signed)
[facility] NOTIFICATION   Patient Details  Name: Kenneth Montoya MRN: [id number] Date of Birth: April 25, 1941   Medicare Observation Status Notification Given:  Yes    Darrien Laakso, LCSW 02/05/2021, 6:51 PM

## 2021-02-05 NOTE — Discharge Instructions (Signed)
Per the Neurologist, take both aspirin and plavix both daily for 3 weeks, afterwards continue with plavix alone

## 2021-02-05 NOTE — Progress Notes (Signed)
  Echocardiogram 2D Echocardiogram has been performed.  Kenneth Montoya 02/05/2021, 12:09 PM

## 2021-02-05 NOTE — Care Management CC44 (Signed)
Condition Code 44 Documentation Completed  Patient Details  Name: Kenneth Montoya MRN: [id number] Date of Birth: [birth year]   Condition Code 44 given:  Yes Patient signature on Condition Code 44 notice:  Yes Documentation of 2 MD's agreement:  Yes Code 44 added to claim:  Yes    Roslyn Else, LCSW 02/05/2021, 6:51 PM

## 2021-02-06 ENCOUNTER — Telehealth: Payer: Self-pay | Admitting: Neurology

## 2021-02-06 ENCOUNTER — Other Ambulatory Visit: Payer: Self-pay | Admitting: Internal Medicine

## 2021-02-06 ENCOUNTER — Other Ambulatory Visit: Payer: Self-pay

## 2021-02-06 DIAGNOSIS — I63423 Cerebral infarction due to embolism of bilateral anterior cerebral arteries: Secondary | ICD-10-CM

## 2021-02-06 DIAGNOSIS — H5340 Unspecified visual field defects: Secondary | ICD-10-CM

## 2021-02-06 NOTE — Patient Outreach (Signed)
[name]) Care Management  02/06/2021  Kenneth Montoya 1940-10-05 [id number]   Referral:  Upstream MD  Referral for hospital admission and referral for Chronic Care Management for post hospital follow up.  Patient did not admit.  Plan:  Referral for chronic care management team for ongoing needs.  For questions,  Natividad Brood, RN BSN [place] [facility] Liaison  [phone] business mobile phone Toll free office [phone]  Fax number: [phone] [name]

## 2021-02-06 NOTE — Telephone Encounter (Signed)
Patient called in stating he did not receive a prescription for Plavix upon discharge from hospital. Stated he received instructions to continue aspirin and Plavix for 3 weeks and then Plavix alone but his pharmacy [facility] on [address] did not receive that prescription was seen by Dr. Leonie Man in hospital. Please  Advise

## 2021-02-06 NOTE — Telephone Encounter (Signed)
Pt ask for refill for Plavix, informed him because have not been seen at this office we would not be able to fill that medication. Informed him until refill can be filled PCP would be able to fill that medication after a being in the hospital

## 2021-02-07 ENCOUNTER — Ambulatory Visit (INDEPENDENT_AMBULATORY_CARE_PROVIDER_SITE_OTHER): Payer: Medicare Other | Admitting: *Deleted

## 2021-02-07 ENCOUNTER — Telehealth: Payer: Self-pay | Admitting: Neurology

## 2021-02-07 ENCOUNTER — Other Ambulatory Visit: Payer: Self-pay | Admitting: Internal Medicine

## 2021-02-07 ENCOUNTER — Telehealth: Payer: Self-pay | Admitting: Interventional Cardiology

## 2021-02-07 ENCOUNTER — Telehealth: Payer: Self-pay | Admitting: *Deleted

## 2021-02-07 DIAGNOSIS — I63423 Cerebral infarction due to embolism of bilateral anterior cerebral arteries: Secondary | ICD-10-CM

## 2021-02-07 DIAGNOSIS — I10 Essential (primary) hypertension: Secondary | ICD-10-CM

## 2021-02-07 NOTE — Chronic Care Management (AMB) (Signed)
  Care Management   Follow Up Note   02/07/2021 Name: Kenneth Montoya MRN: [id number] DOB: 1941/05/25   Referred by: Janith Lima, MD Reason for referral : Care Coordination (Plavix)   Successful telephone outreach to patient for Plavix prescription.  Patient answered and confirmed he was able to  get new prescription of Plavix from PCP office.  Denies any medication needs at this time.  Does state he would like to know the results of his echocardiogram.  RNCM encouraged patient  to contact Neurology or Cardiology or Primary Care through My Chart or voice message for formal results of procedure.  Follow Up Plan: The care management team will reach out to the patient again over the next 30 business days.   Primary Embedded RN Will Contact patient for initial assessment.  Hubert Azure RN, MSN RN Care Management Coordinator  [phone] [email or web address]

## 2021-02-07 NOTE — Telephone Encounter (Signed)
Sheppard Evens called in from chronic care management, stated Rush Landmark was just seen 9/2 with jaffe, and had to be seen at ER 9/11 for another stroke. The patient did not want to be admitted. The doctor at hospital did not send an RX home with him, but told him he needed to take plavix along with Asprin. She wanted to see if jaffe could send a prescription for the plavix. She said just call the pt and let him know whether jaffe will or not. The doctor was Isla Pence and Dr. Tyrone Nine. [phone] chronic care

## 2021-02-07 NOTE — Telephone Encounter (Signed)
Kenneth Montoya called back in to let jaffe know bills PCP sent in plavix for him. No need for a call back.

## 2021-02-07 NOTE — Telephone Encounter (Signed)
Pt called in and stated that he did see she Echo results on his mychart but does not understand them.  He would like a call back at [phone] to go over those results

## 2021-02-07 NOTE — Chronic Care Management (AMB) (Signed)
  Chronic Care Management   Note  02/07/2021 Name: Kenneth Montoya MRN: [id number] DOB: [birth year]  Kenneth Montoya is a 80 y.o. year old male who is a primary care patient of Janith Lima, MD. I reached out to [name] by phone today in response to a referral sent by Kenneth Montoya PCP, Dr. Ronnald Ramp.      Kenneth Montoya was given information about Chronic Care Management services today including:  CCM service includes personalized support from designated clinical staff supervised by his physician, including individualized plan of care and coordination with other care providers 24/7 contact phone numbers for assistance for urgent and routine care needs. Service will only be billed when office clinical staff spend 20 minutes or more in a month to coordinate care. Only one practitioner may furnish and bill the service in a calendar month. The patient may stop CCM services at any time (effective at the end of the month) by phone call to the office staff. The patient will be responsible for cost sharing (co-pay) of up to 20% of the service fee (after annual deductible is met).  Patient agreed to services and verbal consent obtained.   Follow up plan: Telephone appointment with care management team member scheduled for:02/20/21  [place] Management  Direct Dial: [phone]

## 2021-02-07 NOTE — Telephone Encounter (Signed)
Spoke with pt and reviewed information from his echo.  Pt started to mention his concerns about "moderate to advanced chronic microvascular ischemic disease and moderate paranasal sinus mucosal thickening."  Advised pt this was noted on his Brain MRI and to reach out to Neuro or PCP about those concerns.  Pt appreciative for call.

## 2021-02-08 ENCOUNTER — Telehealth: Payer: Self-pay | Admitting: Neurology

## 2021-02-08 NOTE — Telephone Encounter (Signed)
I called patient to schedule appt with [facility] patient would like to speak to someone about the CT scan. He states that he does not have the heart monitor on because there was not a room at the hospital and 40 ppl waiting in the waiting room, they sent the patient home. He states that the Cardiologist states that it is not his heart. Please call

## 2021-02-08 NOTE — Telephone Encounter (Signed)
Pt called in and is concerned about his frequent strokes. He said he has a lot of anxiety about this and he doesn't think he needs to wait till march for a follow up. He would like to discuss this with jaffe and hope for earlier appt

## 2021-02-08 NOTE — Telephone Encounter (Signed)
As a neurologist who treats stroke and wants to do everything possible to prevent another stroke, I recommend proceeding with the cardiac event monitor.  That is the only recommendation at this time as far as stroke workup.   Per pt he will not be able to get the heart monitor until October.   Pt states he is taking Plavix and Asprin for stork prevention.  Pt just want to make sure from a Neurologic  stand point is there something wrong with the Brain.    Advised pt if there was any serious or life threatening we would  Dr.Jaffe would call the pt personally to go over the next steps.

## 2021-02-09 NOTE — Telephone Encounter (Signed)
Per Dr.Jaffe, In this situation there is nothing else that is "urgently" to be done.  His heart structurally looks okay on echocardiogram.  However, we need to look for an arrhythmia which may be a cause for stroke (in which case he would need to be placed on a different blood thinner).  In the meantime, continue aspirin and Plavix with plan for the heart monitor

## 2021-02-13 ENCOUNTER — Other Ambulatory Visit: Payer: Self-pay | Admitting: Neurology

## 2021-02-13 ENCOUNTER — Other Ambulatory Visit: Payer: Self-pay | Admitting: Internal Medicine

## 2021-02-17 ENCOUNTER — Other Ambulatory Visit: Payer: Self-pay | Admitting: Internal Medicine

## 2021-02-17 DIAGNOSIS — I10 Essential (primary) hypertension: Secondary | ICD-10-CM

## 2021-02-19 ENCOUNTER — Other Ambulatory Visit: Payer: Self-pay

## 2021-02-19 ENCOUNTER — Ambulatory Visit (INDEPENDENT_AMBULATORY_CARE_PROVIDER_SITE_OTHER): Payer: Medicare Other | Admitting: Internal Medicine

## 2021-02-19 ENCOUNTER — Encounter: Payer: Self-pay | Admitting: Internal Medicine

## 2021-02-19 VITALS — BP 126/76 | HR 74 | Temp 98.1°F | Ht 69.0 in | Wt 194.0 lb

## 2021-02-19 DIAGNOSIS — I6300 Cerebral infarction due to thrombosis of unspecified precerebral artery: Secondary | ICD-10-CM

## 2021-02-19 DIAGNOSIS — E222 Syndrome of inappropriate secretion of antidiuretic hormone: Secondary | ICD-10-CM

## 2021-02-19 DIAGNOSIS — I10 Essential (primary) hypertension: Secondary | ICD-10-CM

## 2021-02-19 NOTE — Progress Notes (Signed)
Subjective:  Patient ID: Kenneth Montoya, male    DOB: 1941/05/07  Age: 80 y.o. MRN: [id number]  CC: Follow-up  This visit occurred during the SARS-CoV-2 public health emergency.  Safety protocols were in place, including screening questions prior to the visit, additional usage of staff PPE, and extensive cleaning of exam room while observing appropriate contact time as indicated for disinfecting solutions.    HPI Kenneth Montoya presents for f/up -  He is doing well. Has no sx's related to the recent CVAs.  Outpatient Medications Prior to Visit  Medication Sig Dispense Refill   acetaminophen (TYLENOL) 500 MG tablet Take 1,000 mg by mouth in the morning and at bedtime.     amLODipine (NORVASC) 10 MG tablet TAKE 1 TABLET BY MOUTH AT  BEDTIME 90 tablet 1   ascorbic acid (VITAMIN C) 1000 MG tablet Take 1,000 mg by mouth at bedtime.     aspirin EC 81 MG tablet Take 81 mg by mouth daily. Swallow whole.     atorvastatin (LIPITOR) 10 MG tablet TAKE 1 TABLET(10 MG) BY MOUTH DAILY 90 tablet 1   clopidogrel (PLAVIX) 75 MG tablet TAKE 1 TABLET(75 MG) BY MOUTH DAILY FOR 21 DAYS 90 tablet 0   fluticasone (FLONASE) 50 MCG/ACT nasal spray Place into both nostrils daily.     losartan (COZAAR) 100 MG tablet TAKE 1 TABLET BY MOUTH  DAILY (Patient taking differently: Take 100 mg by mouth daily.) 90 tablet 1   naproxen sodium (ALEVE) 220 MG tablet Take 220 mg by mouth.     olopatadine (PATANOL) 0.1 % ophthalmic solution Place 1 drop into both eyes at bedtime.     omeprazole (PRILOSEC) 40 MG capsule TAKE 1 CAPSULE BY MOUTH  DAILY (Patient taking differently: Take 40 mg by mouth daily.) 90 capsule 1   psyllium (METAMUCIL) 58.6 % powder Take 1 packet by mouth at bedtime.     tamsulosin (FLOMAX) 0.4 MG CAPS capsule Take 0.4 mg by mouth at bedtime.     No facility-administered medications prior to visit.    ROS Review of Systems  Constitutional:  Negative for diaphoresis and fatigue.  HENT: Negative.     Eyes: Negative.   Respiratory:  Negative for cough and shortness of breath.   Cardiovascular:  Negative for chest pain, palpitations and leg swelling.  Gastrointestinal:  Negative for abdominal pain, diarrhea and nausea.  Endocrine: Negative.   Genitourinary: Negative.  Negative for difficulty urinating.  Musculoskeletal: Negative.   Skin: Negative.  Negative for color change.  Neurological: Negative.  Negative for dizziness, weakness and light-headedness.  Hematological:  Negative for adenopathy. Does not bruise/bleed easily.  Psychiatric/Behavioral: Negative.     Objective:  BP 126/76 (BP Location: Left Arm, Patient Position: Sitting, Cuff Size: Large)   Pulse 74   Temp 98.1 F (36.7 C) (Oral)   Ht 5\' 9"  (1.753 m)   Wt 194 lb (88 kg)   SpO2 99%   BMI 28.65 kg/m   BP Readings from Last 3 Encounters:  02/19/21 126/76  02/05/21 (!) 144/88  01/26/21 129/67    Wt Readings from Last 3 Encounters:  02/19/21 194 lb (88 kg)  01/26/21 193 lb (87.5 kg)  01/24/21 190 lb 14.7 oz (86.6 kg)    Physical Exam Vitals reviewed.  HENT:     Nose: Nose normal.     Mouth/Throat:     Mouth: Mucous membranes are moist.  Eyes:     Conjunctiva/sclera: Conjunctivae normal.  Cardiovascular:  Rate and Rhythm: Normal rate and regular rhythm.  Pulmonary:     Breath sounds: No wheezing, rhonchi or rales.  Abdominal:     General: Abdomen is flat.     Palpations: There is no mass.     Tenderness: There is no abdominal tenderness. There is no guarding.     Hernia: No hernia is present.  Musculoskeletal:        General: Normal range of motion.     Cervical back: Neck supple.     Right lower leg: No edema.     Left lower leg: No edema.  Lymphadenopathy:     Cervical: No cervical adenopathy.  Skin:    General: Skin is warm and dry.  Neurological:     General: No focal deficit present.     Mental Status: He is alert.    Lab Results  Component Value Date   WBC 6.8 02/05/2021   HGB  12.2 (L) 02/05/2021   HCT 36.0 (L) 02/05/2021   PLT 169 02/05/2021   GLUCOSE 128 (H) 02/05/2021   CHOL 120 02/05/2021   TRIG 38 02/05/2021   HDL 64 02/05/2021   LDLCALC 48 02/05/2021   ALT 14 02/05/2021   AST 20 02/05/2021   NA 131 (L) 02/05/2021   K 4.0 02/05/2021   CL 98 02/05/2021   CREATININE 1.04 02/05/2021   BUN 16 02/05/2021   CO2 24 02/05/2021   TSH 4.89 12/15/2020   PSA 2.50 12/15/2020   INR 1.0 02/04/2021   HGBA1C 5.8 (H) 12/10/2020    CT HEAD WO CONTRAST  Result Date: 02/04/2021 CLINICAL DATA:  Dizziness and change in vision. EXAM: CT HEAD WITHOUT CONTRAST TECHNIQUE: Contiguous axial images were obtained from the base of the skull through the vertex without intravenous contrast. COMPARISON:  Brain MRI July 16-2022 FINDINGS: Brain: No evidence of acute infarction, hemorrhage, hydrocephalus, extra-axial collection or mass lesion/mass effect. Area of encephalomalacia in the right occipital lobe from prior known ischemic infarct. Similar small area of encephalomalacia in the posterior right frontal lobe. Deep white matter microangiopathy. Vascular: No hyperdense vessel or unexpected calcification. Skull: Normal. Negative for fracture or focal lesion. Sinuses/Orbits: Chronic sinusitis of the ethmoid, maxillary and frontal sinuses. Other: None. IMPRESSION: 1. No acute intracranial abnormality. 2. Encephalomalacia in the right occipital lobe from prior known ischemic infarct. 3. Chronic small area of encephalomalacia in the posterior right frontal lobe. 4. Deep white matter microangiopathy. 5. Chronic sinusitis. Electronically Signed   By: Fidela Salisbury M.D.   On: 02/04/2021 15:07   MR BRAIN WO CONTRAST  Result Date: 02/04/2021 CLINICAL DATA:  Neuro deficit, acute, stroke suspected EXAM: MRI HEAD WITHOUT CONTRAST TECHNIQUE: Multiplanar, multiecho pulse sequences of the brain and surrounding structures were obtained without intravenous contrast. COMPARISON:  Same day head CT.  FINDINGS: Mildly motion limited study. Brain: Small acute or subacute infarct in the right frontal cortex (series 2, image 36) and punctate acute infarct in the left frontal cortex (series 2, image 38) no evidence of acute hemorrhage, hydrocephalus, mass lesion, midline shift, or extra-axial fluid collection. Encephalomalacia in the medial right occipital lobe, compatible with sequela of previously seen infarct. Some curvilinear T2 hypointensity and susceptibility artifact in this region likely is the sequela of prior hemorrhage. No correlate hyperdensity on same day head CT to suggest acute hemorrhage. Additional remote infarct in the left medial occipital lobe. Additional scattered moderate to advanced T2 hyperintensities within the white matter, nonspecific but compatible with chronic microvascular ischemic disease. Small remote  lacunar infarcts in the cerebellum bilaterally. Vascular: Major arterial flow voids are maintained at the skull base. Skull and upper cervical spine: Normal marrow signal. Sinuses/Orbits: Moderate paranasal sinus mucosal thickening. Other: No sizable mastoid effusions. IMPRESSION: 1. Small probable acute or subacute infarct in the right frontal cortex and punctate acute infarct in the left frontal cortex 2. Remote bilateral occipital lobe infarcts with evidence of prior hemorrhage on the right. 3. Small remote bilateral cerebellar lacunar infarcts. 4. Moderate to advanced chronic microvascular ischemic disease. 5. Moderate paranasal sinus mucosal thickening. Electronically Signed   By: Margaretha Sheffield M.D.   On: 02/04/2021 18:43   ECHOCARDIOGRAM COMPLETE  Result Date: 02/05/2021    ECHOCARDIOGRAM REPORT   Patient Name:   ESWIN WORRELL Date of Exam: 02/05/2021 Medical Rec #:  [id number]        Height:       69.0 in Accession #:    [id number]       Weight:       193.0 lb Date of Birth:  07-26-1940         BSA:          2.035 m Patient Age:    72 years         BP:           140/67 mmHg  Patient Gender: M                HR:           62 bpm. Exam Location:  Inpatient Procedure: 2D Echo, Cardiac Doppler, Color Doppler and Intracardiac            Opacification Agent Indications:    Stroke  History:        Patient has no prior history of Echocardiogram examinations.                 Risk Factors:Hypertension. H/o COVID-19. H/o stroke.  Sonographer:    Clayton Lefort RDCS (AE) Referring Phys: Earnie Larsson LAMA  Sonographer Comments: Technically difficult study due to poor echo windows. IMPRESSIONS  1. No apical thrombus by Definity contrast. Left ventricular ejection fraction, by estimation, is 60 to 65%. The left ventricle has normal function. The left ventricle has no regional wall motion abnormalities. There is moderate left ventricular hypertrophy. Left ventricular diastolic parameters are consistent with Grade I diastolic dysfunction (impaired relaxation).  2. Right ventricular systolic function is mildly reduced. The right ventricular size is normal.  3. The mitral valve was not well visualized. No evidence of mitral valve regurgitation.  4. The aortic valve is tricuspid. Aortic valve regurgitation is not visualized. Comparison(s): No prior Echocardiogram. FINDINGS  Left Ventricle: No apical thrombus by Definity contrast. Left ventricular ejection fraction, by estimation, is 60 to 65%. The left ventricle has normal function. The left ventricle has no regional wall motion abnormalities. Definity contrast agent was given IV to delineate the left ventricular endocardial borders. The left ventricular internal cavity size was normal in size. There is moderate left ventricular hypertrophy. Abnormal (paradoxical) septal motion, consistent with left bundle branch block. Left ventricular diastolic parameters are consistent with Grade I diastolic dysfunction (impaired relaxation). Indeterminate filling pressures. Right Ventricle: The right ventricular size is normal. No increase in right ventricular wall  thickness. Right ventricular systolic function is mildly reduced. Left Atrium: Left atrial size was normal in size. Right Atrium: Right atrial size was normal in size. Pericardium: There is no evidence of pericardial effusion. Mitral Valve: The mitral valve  was not well visualized. No evidence of mitral valve regurgitation. MV peak gradient, 5.5 mmHg. The mean mitral valve gradient is 1.0 mmHg. Tricuspid Valve: The tricuspid valve is not well visualized. Tricuspid valve regurgitation is not demonstrated. Aortic Valve: The aortic valve is tricuspid. Aortic valve regurgitation is not visualized. Aortic valve mean gradient measures 3.0 mmHg. Aortic valve peak gradient measures 5.9 mmHg. Aortic valve area, by VTI measures 2.13 cm. Pulmonic Valve: The pulmonic valve was normal in structure. Pulmonic valve regurgitation is not visualized. Aorta: The aortic root and ascending aorta are structurally normal, with no evidence of dilitation. IAS/Shunts: No atrial level shunt detected by color flow Doppler.  LEFT VENTRICLE PLAX 2D LVIDd:         5.00 cm  Diastology LVIDs:         4.30 cm  LV e' medial:    3.99 cm/s LV PW:         1.30 cm  LV E/e' medial:  13.8 LV IVS:        1.50 cm  LV e' lateral:   7.49 cm/s LVOT diam:     1.90 cm  LV E/e' lateral: 7.3 LV SV:         54 LV SV Index:   26 LVOT Area:     2.84 cm  RIGHT VENTRICLE RV Basal diam:  2.50 cm RV S prime:     8.93 cm/s TAPSE (M-mode): 2.8 cm LEFT ATRIUM           Index       RIGHT ATRIUM           Index LA diam:      3.10 cm 1.52 cm/m  RA Area:     14.30 cm LA Vol (A4C): 34.2 ml 16.81 ml/m RA Volume:   35.00 ml  17.20 ml/m  AORTIC VALVE AV Area (Vmax):    1.97 cm AV Area (Vmean):   1.93 cm AV Area (VTI):     2.13 cm AV Vmax:           121.00 cm/s AV Vmean:          81.600 cm/s AV VTI:            0.251 m AV Peak Grad:      5.9 mmHg AV Mean Grad:      3.0 mmHg LVOT Vmax:         84.20 cm/s LVOT Vmean:        55.500 cm/s LVOT VTI:          0.189 m LVOT/AV VTI  ratio: 0.75  AORTA Ao Root diam: 3.70 cm MITRAL VALVE MV Area (PHT): 1.82 cm     SHUNTS MV Area VTI:   1.67 cm     Systemic VTI:  0.19 m MV Peak grad:  5.5 mmHg     Systemic Diam: 1.90 cm MV Mean grad:  1.0 mmHg MV Vmax:       1.17 m/s MV Vmean:      51.3 cm/s MV Decel Time: 416 msec MV E velocity: 55.00 cm/s MV A velocity: 113.00 cm/s MV E/A ratio:  0.49 Lyman Bishop MD Electronically signed by Lyman Bishop MD Signature Date/Time: 02/05/2021/2:09:28 PM    Final     Assessment & Plan:   Ajmal was seen today for follow-up.  Diagnoses and all orders for this visit:  Essential hypertension- His BP is well controlled. Will cont the current antihypertensives.  SIADH (syndrome of inappropriate ADH production) (  [place])- Improvement noted.  I am having [employer]wyn[employer]" maintain his ascorbic acid, acetaminophen, tamsulosin, psyllium, olopatadine, omeprazole, losartan, aspirin EC, clopidogrel, atorvastatin, amLODipine, naproxen sodium, and fluticasone.  No orders of the defined types were placed in this encounter.    Follow-up: No follow-ups on file.  Scarlette Calico, MD

## 2021-02-20 ENCOUNTER — Ambulatory Visit: Payer: Medicare Other | Admitting: *Deleted

## 2021-02-20 ENCOUNTER — Telehealth: Payer: Self-pay | Admitting: Pharmacist

## 2021-02-20 DIAGNOSIS — I63423 Cerebral infarction due to embolism of bilateral anterior cerebral arteries: Secondary | ICD-10-CM

## 2021-02-20 DIAGNOSIS — E785 Hyperlipidemia, unspecified: Secondary | ICD-10-CM

## 2021-02-20 DIAGNOSIS — I10 Essential (primary) hypertension: Secondary | ICD-10-CM

## 2021-02-20 NOTE — Telephone Encounter (Signed)
Patient called stating that his blood pressure have been very good. They have been all <130/50-60's. His highest reading was 915 systolic. He has been avoiding stressful situations is possible. Advised he continue amlodipine 10mg  daily and losartan 100mg  daily.  Follow up as needed.

## 2021-02-20 NOTE — Chronic Care Management (AMB) (Signed)
Chronic Care Management   CCM RN Visit Note  02/20/2021 Name: Kenneth Montoya MRN: [id number] DOB: 1940-08-11  Subjective: Kenneth Montoya is a 80 y.o. year old male who is a primary care patient of Janith Lima, MD. The care management team was consulted for assistance with disease management and care coordination needs.    Engaged with patient by telephone for initial visit in response to provider referral for case management and/or care coordination services.   Consent to Services:  The patient was given information about Chronic Care Management services, agreed to services, and gave verbal consent 02/07/21 prior to initiation of services.  Please see initial visit note for detailed documentation.  Patient agreed to services and verbal consent obtained.   Assessment: Review of patient past medical history, allergies, medications, health status, including review of consultants reports, laboratory and other test data, was performed as part of comprehensive evaluation and provision of chronic care management services.   SDOH (Social Determinants of Health) assessments and interventions performed:  SDOH Interventions    Flowsheet Row Most Recent Value  SDOH Interventions   Food Insecurity Interventions Intervention Not Indicated  Housing Interventions Intervention Not Indicated  Transportation Interventions Intervention Not Indicated  [patient drives self]      CCM Care Plan No Known Allergies  Outpatient Encounter Medications as of 02/20/2021  Medication Sig   acetaminophen (TYLENOL) 500 MG tablet Take 1,000 mg by mouth in the morning and at bedtime.   amLODipine (NORVASC) 10 MG tablet TAKE 1 TABLET BY MOUTH AT  BEDTIME   ascorbic acid (VITAMIN C) 1000 MG tablet Take 1,000 mg by mouth at bedtime.   aspirin EC 81 MG tablet Take 81 mg by mouth daily. Swallow whole.   atorvastatin (LIPITOR) 10 MG tablet TAKE 1 TABLET(10 MG) BY MOUTH DAILY   clopidogrel (PLAVIX) 75 MG tablet TAKE 1  TABLET(75 MG) BY MOUTH DAILY FOR 21 DAYS   fluticasone (FLONASE) 50 MCG/ACT nasal spray Place into both nostrils daily.   losartan (COZAAR) 100 MG tablet TAKE 1 TABLET BY MOUTH  DAILY (Patient taking differently: Take 100 mg by mouth daily.)   naproxen sodium (ALEVE) 220 MG tablet Take 220 mg by mouth.   olopatadine (PATANOL) 0.1 % ophthalmic solution Place 1 drop into both eyes at bedtime.   omeprazole (PRILOSEC) 40 MG capsule TAKE 1 CAPSULE BY MOUTH  DAILY (Patient taking differently: Take 40 mg by mouth daily.)   psyllium (METAMUCIL) 58.6 % powder Take 1 packet by mouth at bedtime.   tamsulosin (FLOMAX) 0.4 MG CAPS capsule Take 0.4 mg by mouth at bedtime.   No facility-administered encounter medications on file as of 02/20/2021.   Patient Active Problem List   Diagnosis Date Noted   Stroke (cerebrum) ([place]) 02/04/2021   SIADH (syndrome of inappropriate ADH production) ([place]) 01/10/2021   Hyperlipidemia with target LDL less than 100 12/17/2020   Stroke ([place]) 12/09/2020   Cerebral infarction ([place]) 12/09/2020   Psychophysiological insomnia 11/21/2020   GAD (generalized anxiety disorder) 11/21/2020   BPH (benign prostatic hyperplasia)    Essential hypertension 11/08/2019   Gastroesophageal reflux disease without esophagitis 11/08/2019   Herpes simplex 11/08/2019   Primary erectile dysfunction 11/08/2019   Senile purpura ([place]) 05/13/2019   Hardening of the aorta (main artery of the heart) ([place]) 01/19/2019   Chronic allergic rhinitis due to pollen 03/24/2017   LPRD (laryngopharyngeal reflux disease) 03/24/2017   Bladder outflow obstruction 03/04/2016   Conditions to be addressed/monitored:  HTN, HLD, and  CVA  Care Plan : RN Care Manager Plan of Care  Updates made by Knox Royalty, RN since 02/20/2021 12:00 AM     Problem: Chronic Disease Management Needs   Priority: Medium     Long-Range Goal: Development of plan od care for long term chronic disease management   Start Date:  02/20/2021  Expected End Date: 02/20/2022  Priority: Medium  Note:   Current Barriers:  Chronic Disease Management support and education needs related to HTN, HLD, and CVA Two recent hospitalizations for ischemic CVA: July 16-17, 2022 and September 11-12, 2022  RNCM Clinical Goal(s):  Patient will demonstrate ongoing health management independence with HTN, HLD, and CVA  through collaboration with RN Care manager, provider, and care team.   Interventions: 1:1 collaboration with primary care provider regarding development and update of comprehensive plan of care as evidenced by provider attestation and co-signature Inter-disciplinary care team collaboration (see longitudinal plan of care) Evaluation of current treatment plan related to  self management and patient's adherence to plan as established by provider Discussed with patient CCM role- patient had numerous very good questions about purpose of CCM- he re-provides verbal consent today after our conversation and understands he can stop receiving CCM care management services at any point in the future should he wish to  Hyperlipidemia:  (Status: New goal.) Lab Results  Component Value Date   CHOL 120 02/05/2021   HDL 64 02/05/2021   LDLCALC 48 02/05/2021   TRIG 38 02/05/2021   CHOLHDL 1.9 02/05/2021  Medication review performed; medication list updated in electronic medical record.  Provider established cholesterol goals reviewed; Counseled on importance of regular laboratory monitoring as prescribed; Provided HLD educational materials; Reviewed role and benefits of statin for ASCVD risk reduction; Reviewed importance of limiting foods high in cholesterol; Assessed social determinant of health barriers;  Reviewed recent hospitalizations with patient; confirmed patient able to verbalize good understanding of hospitalizations, post- hospital discharge instructions, medications, and general plan of care Reviewed test results performed  during hospitalization with patient- reviewed from general standpoint, encouraged patient to discuss specific details of testing/ any questions he has with care providers- encouraged him to discuss with cardiology provider during office visit tomorrow, which he verbalized plans to do Confirmed patient has very good understanding of overall goals for HLD levels and follows heart healthy, low sodium, low cholesterol diet  Hypertension: (Status: New goal.) Last practice recorded BP readings:  BP Readings from Last 3 Encounters:  02/19/21 126/76  02/05/21 (!) 144/88  01/26/21 129/67  Most recent eGFR/CrCl: No results found for: EGFR  No components found for: CRCL  Evaluation of current treatment plan related to hypertension self management and patient's adherence to plan as established by provider;   Reviewed prescribed diet heart healthy, low sodium, low cholesterol Discussed plans with patient for ongoing care management follow up and provided patient with direct contact information for care management team; Reviewed scheduled/upcoming provider appointments including:  Advised patient to discuss need for possible loop recorder with provider; Provided education on prescribed diet heart healthy, low salt, low cholesterol ;  Discussed complications of poorly controlled blood pressure such as heart disease, stroke, circulatory complications, vision complications, kidney impairment, sexual dysfunction;  Confirmed patient plans to attend cardiology provider office visit tomorrow- encouraged him to discuss with provider his feelings about suggested placement of loop recorder: patient verbalizes that at this point he does not wish to have this procedure, but he will make his mind up after discussing with cardiology  provider tomorrow Confirmed patient periodically monitors and records his blood pressures at home: reviewed with patient today and he reports general ranges between 116- 135/ 50-60; reports most  recently obtained blood pressure of "116/63"  Reviewed with patient signs/ symptoms CVA: he is familiar with many signs/ symptoms, will provide additional education through [facility] action plan for development of signs/ symptoms CVA: patient verbalizes good understanding of need to call EMS right away for development of same  Patient Goals/Self-Care Activities: Patient will self administer medications as prescribed as evidenced by self report report  Patient will attend all scheduled provider appointments as evidenced by clinician review of documented attendance to scheduled appointments and patient report Patient will call pharmacy for medication refills as evidenced by patient report and review of pharmacy fill history as appropriate Patient will attend church or other social activities as evidenced by patient report Patient will call provider office for new concerns or questions as evidenced by review of documented incoming telephone call notes and patient report Patient will continue to follow heart healthy, low salt, low cholesterol diet Patient will continue to stay active and social by periodically playing golf and attending church and other social groups Patient will discuss suggested procedure for loop recorder placement with his cardiology provider Patient will continue to occasionally monitor and write down on paper his blood pressure at home      Plan: Telephone follow up appointment with care management team member scheduled for:  Tuesday, May 01, 2021 at 1:00 pm The patient has been provided with contact information for the care management team and has been advised to call with any health related questions or concerns

## 2021-02-20 NOTE — Patient Instructions (Signed)
Visit Information   Kenneth Montoya, it was nice talking with you today.    I look forward to talking to you again for an update on Tuesday, May 01, 2021 at 1:00 pm- please be listening out for my call that day.  I will call as close to 1:00 pm as possible.   If you need to cancel or re-schedule our telephone visit, please call [phone] and one of our care guides will be happy to assist you.   I look forward to hearing about your progress.   Please don't hesitate to contact me if I can be of assistance to you before our next scheduled telephone appointment.   Oneta Rack, RN, BSN, [facility] RN Care Coordination- [place] [phone]: direct office [phone]: mobile   PATIENT GOALS:   Goals Addressed             This Visit's Progress    Patient Self-Care Activities   On track    Timeframe:  Long-Range Goal Priority:  Medium Start Date:         02/20/21                    Expected End Date:   02/20/22                    Patient will self administer medications as prescribed as evidenced by self report report  Patient will attend all scheduled provider appointments as evidenced by clinician review of documented attendance to scheduled appointments and patient report Patient will call pharmacy for medication refills as evidenced by patient report and review of pharmacy fill history as appropriate Patient will attend church or other social activities as evidenced by patient report Patient will call provider office for new concerns or questions as evidenced by review of documented incoming telephone call notes and patient report Patient will continue to follow heart healthy, low salt, low cholesterol diet Patient will continue to stay active and social by periodically playing golf and attending church and other social groups Patient will discuss suggested procedure for loop recorder placement with his cardiology provider Patient will continue to  occasionally monitor and write down on paper his blood pressure at home       Warning Signs of a Stroke A stroke is a medical emergency and should be treated right away--every second counts. A stroke is caused by a decrease or block in blood flow to the brain. When certain areas of the brain do not get enough oxygen, brain cells begin to die. A stroke can lead to brain damage and can sometimes be life-threatening. However, if a person gets medical treatment right away, he or she has a better chance of surviving and recovering from a stroke. It is very important to be able to recognize the symptoms of a stroke. Types of strokes There are two main types of strokes: Ischemic stroke. This is the most common type. This stroke happens when a blood vessel that supplies blood to the brain is blocked. Hemorrhagic stroke. This results from bleeding in the brain when a blood vessel leaks or bursts (ruptures). A transient ischemic attack (TIA) causes stroke-like symptoms that go away quickly. Unlike a stroke, a TIA does not cause permanent damage to the brain. However, the symptoms of a TIA are the same as a stroke. TIAs also require medical treatment right away. Having a TIA is a sign that you are at higher risk for a  stroke. Warning signs of a stroke The symptoms of stroke may vary and will reflect the part of the brain that is involved. Symptoms usually happen suddenly. "BE FAST" is an easy way to remember the main warning signs of a stroke: B - Balance. Signs are dizziness, sudden trouble walking, or loss of balance. E - Eyes. Signs are trouble seeing or a sudden change in vision. F - Face. Signs are sudden weakness or numbness of the face, or the face or eyelid drooping on one side. A - Arms. Signs are weakness or numbness in an arm. This happens suddenly and usually on one side of the body. S - Speech. Signs are sudden trouble speaking, slurred speech, or trouble understanding what people say. T -  Time. Time to call emergency services. Write down what time symptoms started. Other signs of a stroke Some less common signs of a stroke include: A sudden, severe headache with no known cause. Nausea or vomiting. Seizure. A stroke may be happening even if only one "BE FAST" symptom is present. These symptoms may represent a serious problem that is an emergency. Do not wait to see if the symptoms will go away. Get medical help right away. Call your local emergency services (911 in the [place] Do not drive yourself to the hospital. Summary A stroke is a medical emergency and should be treated right away--every second counts. "BE FAST" is an easy way to remember the main warning signs of a stroke. Call your local emergency services right away if you or someone else has any stroke symptoms, even if the symptoms go away. Make note of what time the first symptoms appeared. Emergency responders or emergency room staff will need to know this information. Do not wait to see if symptoms will go away. Call 911 even if only one of the "BE FAST" symptoms appears. This information is not intended to replace advice given to you by your health care provider. Make sure you discuss any questions you have with your health care provider. Document Revised: 12/13/2019 Document Reviewed: 12/13/2019 Elsevier Patient Education  [place].  Cholesterol Content in Foods Cholesterol is a waxy, fat-like substance that helps to carry fat in the blood. The body needs cholesterol in small amounts, but too much cholesterol can cause damage to the arteries and heart. Most people should eat less than 200 milligrams (mg) of cholesterol a day. Foods with cholesterol Cholesterol is found in animal-based foods, such as meat, seafood, and dairy. Generally, low-fat dairy and lean meats have less cholesterol than full-fat dairy and fatty meats. The milligrams of cholesterol per serving (mg per serving) of common  cholesterol-containing foods are listed below. Meat and other proteins Egg -- one large whole egg has 186 mg. Veal shank -- 4 oz has 141 mg. Lean ground [place] (93% lean) -- 4 oz has 118 mg. Fat-trimmed lamb loin -- 4 oz has 106 mg. Lean ground beef (90% lean) -- 4 oz has 100 mg. Lobster -- 3.5 oz has 90 mg. Pork loin chops -- 4 oz has 86 mg. Canned salmon -- 3.5 oz has 83 mg. Fat-trimmed beef top loin -- 4 oz has 78 mg. Frankfurter -- 1 frank (3.5 oz) has 77 mg. Crab -- 3.5 oz has 71 mg. Roasted chicken without skin, white meat -- 4 oz has 66 mg. Light bologna -- 2 oz has 45 mg. Deli-cut [place] -- 2 oz has 31 mg. Canned tuna -- 3.5 oz has 31 mg. Berniece Salines -- 1  oz has 29 mg. Oysters and mussels (raw) -- 3.5 oz has 25 mg. Mackerel -- 1 oz has 22 mg. Trout -- 1 oz has 20 mg. Pork sausage -- 1 link (1 oz) has 17 mg. Salmon -- 1 oz has 16 mg. Tilapia -- 1 oz has 14 mg. Dairy Soft-serve ice cream --  cup (4 oz) has 103 mg. Whole-milk yogurt -- 1 cup (8 oz) has 29 mg. Cheddar cheese -- 1 oz has 28 mg. American cheese -- 1 oz has 28 mg. Whole milk -- 1 cup (8 oz) has 23 mg. 2% milk -- 1 cup (8 oz) has 18 mg. Cream cheese -- 1 tablespoon (Tbsp) has 15 mg. Cottage cheese --  cup (4 oz) has 14 mg. Low-fat (1%) milk -- 1 cup (8 oz) has 10 mg. Sour cream -- 1 Tbsp has 8.5 mg. Low-fat yogurt -- 1 cup (8 oz) has 8 mg. Nonfat Greek yogurt -- 1 cup (8 oz) has 7 mg. Half-and-half cream -- 1 Tbsp has 5 mg. Fats and oils Cod liver oil -- 1 tablespoon (Tbsp) has 82 mg. Butter -- 1 Tbsp has 15 mg. Lard -- 1 Tbsp has 14 mg. Bacon grease -- 1 Tbsp has 14 mg. Mayonnaise -- 1 Tbsp has 5-10 mg. Margarine -- 1 Tbsp has 3-10 mg. Exact amounts of cholesterol in these foods may vary depending on specific ingredients and brands. Foods without cholesterol Most plant-based foods do not have cholesterol unless you combine them with a food that has cholesterol. Foods without cholesterol include: Grains  and cereals. Vegetables. Fruits. Vegetable oils, such as olive, canola, and sunflower oil. Legumes, such as peas, beans, and lentils. Nuts and seeds. Egg whites. Summary The body needs cholesterol in small amounts, but too much cholesterol can cause damage to the arteries and heart. Most people should eat less than 200 milligrams (mg) of cholesterol a day. This information is not intended to replace advice given to you by your health care provider. Make sure you discuss any questions you have with your health care provider. Document Revised: 08/24/2019 Document Reviewed: 10/04/2019 Elsevier Patient Education  2022 [employer].  Consent to CCM Services: Kenneth Montoya was given information about Chronic Care Management services including:  CCM service includes personalized support from designated clinical staff supervised by his physician, including individualized plan of care and coordination with other care providers 24/7 contact phone numbers for assistance for urgent and routine care needs. Service will only be billed when office clinical staff spend 20 minutes or more in a month to coordinate care. Only one practitioner may furnish and bill the service in a calendar month. The patient may stop CCM services at any time (effective at the end of the month) by phone call to the office staff. The patient will be responsible for cost sharing (co-pay) of up to 20% of the service fee (after annual deductible is met).  Patient agreed to services and verbal consent obtained.   Patient verbalizes understanding of instructions provided today and agrees to view in MyChart Telephone follow up appointment with care management team member scheduled for:  Tuesday, May 01, 2021 at 1:00 pm The patient has been provided with contact information for the care management team and has been advised to call with any health related questions or concerns  Oneta Rack, RN, BSN, [place] [phone]: direct office [phone]: mobile    CLINICAL CARE PLAN: Patient Care Plan:  RN Care Manager Plan of Care     Problem Identified: Chronic Disease Management Needs   Priority: Medium     Long-Range Goal: Development of plan od care for long term chronic disease management   Start Date: 02/20/2021  Expected End Date: 02/20/2022  Priority: Medium  Note:   Current Barriers:  Chronic Disease Management support and education needs related to HTN, HLD, and CVA Two recent hospitalizations for ischemic CVA: July 16-17, 2022 and September 11-12, 2022  RNCM Clinical Goal(s):  Patient will demonstrate ongoing health management independence with HTN, HLD, and CVA  through collaboration with RN Care manager, provider, and care team.   Interventions: 1:1 collaboration with primary care provider regarding development and update of comprehensive plan of care as evidenced by provider attestation and co-signature Inter-disciplinary care team collaboration (see longitudinal plan of care) Evaluation of current treatment plan related to  self management and patient's adherence to plan as established by provider Discussed with patient CCM role- patient had numerous very good questions about purpose of CCM- he re-provides verbal consent today after our conversation and understands he can stop receiving CCM care management services at any point in the future should he wish to  Hyperlipidemia:  (Status: New goal.) Lab Results  Component Value Date   CHOL 120 02/05/2021   HDL 64 02/05/2021   LDLCALC 48 02/05/2021   TRIG 38 02/05/2021   CHOLHDL 1.9 02/05/2021  Medication review performed; medication list updated in electronic medical record.  Provider established cholesterol goals reviewed; Counseled on importance of regular laboratory monitoring as prescribed; Provided HLD educational materials; Reviewed role and benefits of statin for  ASCVD risk reduction; Reviewed importance of limiting foods high in cholesterol; Assessed social determinant of health barriers;  Reviewed recent hospitalizations with patient; confirmed patient able to verbalize good understanding of hospitalizations, post- hospital discharge instructions, medications, and general plan of care Reviewed test results performed during hospitalization with patient- reviewed from general standpoint, encouraged patient to discuss specific details of testing/ any questions he has with care providers- encouraged him to discuss with cardiology provider during office visit tomorrow, which he verbalized plans to do Confirmed patient has very good understanding of overall goals for HLD levels and follows heart healthy, low sodium, low cholesterol diet  Hypertension: (Status: New goal.) Last practice recorded BP readings:  BP Readings from Last 3 Encounters:  02/19/21 126/76  02/05/21 (!) 144/88  01/26/21 129/67  Most recent eGFR/CrCl: No results found for: EGFR  No components found for: CRCL  Evaluation of current treatment plan related to hypertension self management and patient's adherence to plan as established by provider;   Reviewed prescribed diet heart healthy, low sodium, low cholesterol Discussed plans with patient for ongoing care management follow up and provided patient with direct contact information for care management team; Reviewed scheduled/upcoming provider appointments including:  Advised patient to discuss need for possible loop recorder with provider; Provided education on prescribed diet heart healthy, low salt, low cholesterol ;  Discussed complications of poorly controlled blood pressure such as heart disease, stroke, circulatory complications, vision complications, kidney impairment, sexual dysfunction;  Confirmed patient plans to attend cardiology provider office visit tomorrow- encouraged him to discuss with provider his feelings about suggested  placement of loop recorder: patient verbalizes that at this point he does not wish to have this procedure, but he will make his mind up after discussing with cardiology provider tomorrow Confirmed patient periodically monitors and records his blood pressures at home: reviewed with patient  today and he reports general ranges between 116- 135/ 50-60; reports most recently obtained blood pressure of "116/63"  Reviewed with patient signs/ symptoms CVA: he is familiar with many signs/ symptoms, will provide additional education through [place] action plan for development of signs/ symptoms CVA: patient verbalizes good understanding of need to call EMS right away for development of same  Patient Goals/Self-Care Activities: Patient will self administer medications as prescribed as evidenced by self report report  Patient will attend all scheduled provider appointments as evidenced by clinician review of documented attendance to scheduled appointments and patient report Patient will call pharmacy for medication refills as evidenced by patient report and review of pharmacy fill history as appropriate Patient will attend church or other social activities as evidenced by patient report Patient will call provider office for new concerns or questions as evidenced by review of documented incoming telephone call notes and patient report Patient will continue to follow heart healthy, low salt, low cholesterol diet Patient will continue to stay active and social by periodically playing golf and attending church and other social groups Patient will discuss suggested procedure for loop recorder placement with his cardiology provider

## 2021-02-21 ENCOUNTER — Other Ambulatory Visit: Payer: Self-pay

## 2021-02-21 ENCOUNTER — Ambulatory Visit (INDEPENDENT_AMBULATORY_CARE_PROVIDER_SITE_OTHER): Payer: Medicare Other | Admitting: Physician Assistant

## 2021-02-21 ENCOUNTER — Encounter: Payer: Self-pay | Admitting: Physician Assistant

## 2021-02-21 VITALS — BP 158/72 | HR 80 | Ht 69.0 in | Wt 195.2 lb

## 2021-02-21 DIAGNOSIS — Z8673 Personal history of transient ischemic attack (TIA), and cerebral infarction without residual deficits: Secondary | ICD-10-CM | POA: Diagnosis not present

## 2021-02-21 DIAGNOSIS — E785 Hyperlipidemia, unspecified: Secondary | ICD-10-CM | POA: Diagnosis not present

## 2021-02-21 DIAGNOSIS — I2510 Atherosclerotic heart disease of native coronary artery without angina pectoris: Secondary | ICD-10-CM

## 2021-02-21 DIAGNOSIS — I10 Essential (primary) hypertension: Secondary | ICD-10-CM | POA: Diagnosis not present

## 2021-02-21 DIAGNOSIS — I2584 Coronary atherosclerosis due to calcified coronary lesion: Secondary | ICD-10-CM | POA: Diagnosis not present

## 2021-02-21 DIAGNOSIS — I6300 Cerebral infarction due to thrombosis of unspecified precerebral artery: Secondary | ICD-10-CM

## 2021-02-21 NOTE — Patient Instructions (Signed)
Medication Instructions:   Your physician recommends that you continue on your current medications as directed. Please refer to the Current Medication list given to you today.  *If you need a refill on your cardiac medications before your next appointment, please call your pharmacy*   Lab Work:  -None  If you have labs (blood work) drawn today and your tests are completely normal, you will receive your results only by: [place] (if you have MyChart) OR A paper copy in the mail If you have any lab test that is abnormal or we need to change your treatment, we will call you to review the results.   Testing/Procedures:  -None   Follow-Up:   Keep your next appointment with Dr. Quentin Ore.   At [facility], you and your health needs are our priority.  As part of our continuing mission to provide you with exceptional heart care, we have created designated Provider Care Teams.  These Care Teams include your primary Cardiologist (physician) and Advanced Practice Providers (APPs -  Physician Assistants and Nurse Practitioners) who all work together to provide you with the care you need, when you need it.  We recommend signing up for the patient portal called "MyChart".  Sign up information is provided on this After Visit Summary.  MyChart is used to connect with patients for Virtual Visits (Telemedicine).  Patients are able to view lab/test results, encounter notes, upcoming appointments, etc.  Non-urgent messages can be sent to your provider as well.   To learn more about what you can do with MyChart, go to [email or web address].

## 2021-02-21 NOTE — Progress Notes (Signed)
Cardiology Office Note:    Date:  02/21/2021   ID:  JOHNHENRY TIPPIN, DOB Dec 21, 1940, MRN [id number]  PCP:  Janith Lima, MD   [facility] Providers Cardiologist:  Sinclair Grooms, MD     Referring MD: Janith Lima, MD   Chief Complaint:  Hospitalization Follow-up (S/p CVA x 2 )    Patient Profile:   Kenneth Montoya is a 80 y.o. male with:  Coronary artery calcification Hypertension Hyperlipidemia Carotid artery disease [name] 4/21: Bilateral ICA 1-39 MRA 7/22:  R 40-50 RBBB Erectile dysfunction GERD Chronic hyponatremia; SIADH  Prior CV studies: ECHO COMPLETE WITH IMAGING ENHANCING AGENT 02/05/2021 No apical thrombus, EF 60-65, no RWMA, moderate LVH, GR 1 DD, mildly reduced RVSF  HEAD AND NECK MRA 12/09/20 IMPRESSION: MRA HEAD: 1. Negative intracranial MRA for large vessel occlusion. 2. Severe distal left P2 and right P3 stenoses. 3. Otherwise wide patency of the major intracranial arterial circulation. No proximal high-grade or correctable stenosis. MRA NECK: 1. Short-segment moderate stenosis of up to 40-50% about the proximal right ICA. 2. No significant stenosis involving the left carotid artery system. 3. Both vertebral arteries widely patent within the neck. Right vertebral artery dominant.  VAS US CAROTID DUPLEX BILATERAL 08/26/2019 Bilateral ICA 1-39   History of Present Illness: [name] established with Dr. Tamala Julian in 10/21.  He was admitted with right occipital lobe stroke in 11/2020.  Outpatient event monitor was recommended.  Of note, his COVID-19 test was positive.  There was a question whether or not this was cardioembolic versus due to hypercoagulability in the setting of COVID-19.  He was placed on dual antiplatelet therapy for 3 weeks and then aspirin alone.  He was admitted again 9/11-9/12 with balance issues.  MRI demonstrated bilateral frontal lobe infarcts.  There was significant concern for cardioembolic strokes.  He was placed on dual  antiplatelet therapy with plans to continue Plavix alone after 3 weeks.  Echocardiogram demonstrated no evidence of cardiac thrombus.  He had not started the monitor that was originally ordered in July.  A monitor was mailed to him after discharge.  He returns for follow-up.  He is here alone today.  He is frustrated with his experience at [facility].  He has decided he is not going to wear the cardiac monitor.  He has an appt with Dr. Quentin Ore to discuss +/- ILR implantation.  He checks his BP at home and his readings are optimal.  His BP typically increases in the clinic.  He has not had chest pain, shortness of breath, syncope.  He sleeps in a recliner due to prior should surgery.  He has remained on ASA and Clopidogrel.     Past Medical History:  Diagnosis Date   Arthritis    generalized   BPH (benign prostatic hyperplasia)    Cancer of skin, squamous cell    frozen    Cataract    bilateral sx   GERD (gastroesophageal reflux disease)    on meds   Hypertension    on meds   Shingles    Stroke [facility])    Current Medications: Current Meds  Medication Sig   acetaminophen (TYLENOL) 500 MG tablet Take 1,000 mg by mouth in the morning and at bedtime.   amLODipine (NORVASC) 10 MG tablet TAKE 1 TABLET BY MOUTH AT  BEDTIME   ascorbic acid (VITAMIN C) 1000 MG tablet Take 1,000 mg by mouth at bedtime.   aspirin EC 81 MG tablet  Take 81 mg by mouth daily. Swallow whole.   atorvastatin (LIPITOR) 10 MG tablet TAKE 1 TABLET(10 MG) BY MOUTH DAILY   clopidogrel (PLAVIX) 75 MG tablet TAKE 1 TABLET(75 MG) BY MOUTH DAILY FOR 21 DAYS   fluticasone (FLONASE) 50 MCG/ACT nasal spray Place into both nostrils daily.   losartan (COZAAR) 100 MG tablet TAKE 1 TABLET BY MOUTH  DAILY   naproxen sodium (ALEVE) 220 MG tablet Take 220 mg by mouth daily as needed.   olopatadine (PATANOL) 0.1 % ophthalmic solution Place 1 drop into both eyes at bedtime.   omeprazole (PRILOSEC) 40 MG capsule TAKE 1 CAPSULE BY  MOUTH  DAILY   psyllium (METAMUCIL) 58.6 % powder Take 1 packet by mouth at bedtime.   tamsulosin (FLOMAX) 0.4 MG CAPS capsule Take 0.4 mg by mouth at bedtime.    Allergies:   Patient has no known allergies.   Social History   Tobacco Use   Smoking status: Never   Smokeless tobacco: Never  Vaping Use   Vaping Use: Never used  Substance Use Topics   Alcohol use: Yes    Alcohol/week: 2.0 standard drinks    Types: 2 Standard drinks or equivalent per week    Comment: wine 1-2 x a week and not every week    Drug use: Never    Family Hx: The patient's family history includes Colon polyps (age of onset: 7) in his mother; Stroke in his maternal grandfather. There is no history of Colon cancer, Esophageal cancer, Rectal cancer, or Stomach cancer.  Review of Systems  Hematologic/Lymphatic: Bruises/bleeds easily.  Gastrointestinal:  Negative for hematochezia.  Genitourinary:  Negative for hematuria.    EKGs/Labs/Other Test Reviewed:    EKG:  EKG is not ordered today.  The ekg ordered today demonstrates n/a  Recent Labs: 12/09/2020: B Natriuretic Peptide 45.9 12/10/2020: Magnesium 2.1 12/15/2020: TSH 4.89 02/05/2021: ALT 14; BUN 16; Creatinine, Ser 1.04; Hemoglobin 12.2; Platelets 169; Potassium 4.0; Sodium 131   Recent Lipid Panel Lab Results  Component Value Date/Time   CHOL 120 02/05/2021 04:26 AM   TRIG 38 02/05/2021 04:26 AM   HDL 64 02/05/2021 04:26 AM   LDLCALC 48 02/05/2021 04:26 AM     Risk Assessment/Calculations:          Physical Exam:    VS:  BP (!) 158/72   Pulse 80   Ht 5\' 9"  (1.753 m)   Wt 195 lb 3.2 oz (88.5 kg)   SpO2 98%   BMI 28.83 kg/m     Wt Readings from Last 3 Encounters:  02/21/21 195 lb 3.2 oz (88.5 kg)  02/19/21 194 lb (88 kg)  01/26/21 193 lb (87.5 kg)    Constitutional:      Appearance: Healthy appearance. Not in distress.  Pulmonary:     Breath sounds: No wheezing. No rales.  Cardiovascular:     Normal rate. Regular rhythm. Normal  S1. Normal S2.      Murmurs: There is no murmur.  Edema:    Peripheral edema absent.  Abdominal:     Palpations: Abdomen is soft.  Musculoskeletal:     Cervical back: Neck supple. Skin:    General: Skin is warm and dry.  Neurological:     General: No focal deficit present.     Mental Status: Alert and oriented to person, place and time.       ASSESSMENT & PLAN:   1. History of stroke He has been admitted with 2 strokes in the last  3 months with multiple territories involved.  I attempted to review all of the studies done in the hospital and the rationale for all of them.  I also attempted to explain why it is important to try to identify atrial fibrillation.  I attempted to explain the results of the CRYSTAL AF Trial.  I attempted to explain why anticoagulation is more effective at preventing cardioembolic strokes than antiplatelet therapy.  His CHA2DS2-VASc Score = 6 [CHF History: 0, HTN History: 1, Diabetes History: 0, Stroke History: 2, Vascular Disease History: 1, Age Score: 2, Gender Score: 0].  Therefore, the patient's annual risk of stroke is 9.7 %.  I attempted to explain why we do not automatically place a patient on anticoagulation without identifying atrial fibrillation first.  I also attempted to explain antiarrhythmic drug therapy is also not advisable unless we have identified atrial fibrillation and have a clinical reason to control rhythm.  He emphasized that he wanted to be in control of his care and I tried to present as much information as he would allow me so that informed decision making could guide his care.  He has had 2 monitors mailed to him and he notes he has mailed both of them back.  He has an appt with Dr. Quentin Ore with EP next month.  I have asked our EP team to see if he can be set up for a sooner appt for evaluation.  At this point, it is not clear to me if he will be agreeable to having an ILR implanted.     2. Essential hypertension BP is elevated today.  His BPs  are optimal at home.  Continue amlodipine 10 mg daily, losartan 100 mg daily.  3. Coronary atherosclerosis due to calcified coronary lesion He is not having angina.  Continue aspirin 81 mg daily, atorvastatin 10 mg daily.   4. Hyperlipidemia with target LDL less than 100 Continue atorvastatin 10 mg daily.  LDL in 9/22 was 48.        Dispo:  Return for Keep follow up with Dr. Quentin Ore. .   Medication Adjustments/Labs and Tests Ordered: Current medicines are reviewed at length with the patient today.  Concerns regarding medicines are outlined above.  Tests Ordered: No orders of the defined types were placed in this encounter.  Medication Changes: No orders of the defined types were placed in this encounter.  Signed, Richardson Dopp, PA-C  02/21/2021 4:59 PM    [facility] [place], [place], [place]  [address] Phone: [phone]; Fax: [phone]

## 2021-02-22 ENCOUNTER — Ambulatory Visit: Payer: Medicare Other | Admitting: *Deleted

## 2021-02-22 DIAGNOSIS — E785 Hyperlipidemia, unspecified: Secondary | ICD-10-CM

## 2021-02-22 DIAGNOSIS — I10 Essential (primary) hypertension: Secondary | ICD-10-CM

## 2021-02-22 DIAGNOSIS — I63423 Cerebral infarction due to embolism of bilateral anterior cerebral arteries: Secondary | ICD-10-CM

## 2021-02-22 NOTE — Chronic Care Management (AMB) (Signed)
Chronic Care Management   CCM RN Visit Note  02/22/2021 Name: Kenneth Montoya MRN: [id number] DOB: 1940-09-08  Subjective: Kenneth Montoya is a 80 y.o. year old male who is a primary care patient of Janith Lima, MD. The care management team was consulted for assistance with disease management and care coordination needs.    Engaged with patient by telephone for  acute/ unexpected call from patient  in response to provider referral for case management and/or care coordination services.   Consent to Services:  The patient was given information about Chronic Care Management services, agreed to services, and gave verbal consent prior to initiation of services.  Please see initial visit note for detailed documentation.  Patient agreed to services and verbal consent obtained.   Assessment: Review of patient past medical history, allergies, medications, health status, including review of consultants reports, laboratory and other test data, was performed as part of comprehensive evaluation and provision of chronic care management services.  CCM Care Plan No Known Allergies  Outpatient Encounter Medications as of 02/22/2021  Medication Sig   acetaminophen (TYLENOL) 500 MG tablet Take 1,000 mg by mouth in the morning and at bedtime.   amLODipine (NORVASC) 10 MG tablet TAKE 1 TABLET BY MOUTH AT  BEDTIME   ascorbic acid (VITAMIN C) 1000 MG tablet Take 1,000 mg by mouth at bedtime.   aspirin EC 81 MG tablet Take 81 mg by mouth daily. Swallow whole.   atorvastatin (LIPITOR) 10 MG tablet TAKE 1 TABLET(10 MG) BY MOUTH DAILY   clopidogrel (PLAVIX) 75 MG tablet TAKE 1 TABLET(75 MG) BY MOUTH DAILY FOR 21 DAYS   fluticasone (FLONASE) 50 MCG/ACT nasal spray Place into both nostrils daily.   losartan (COZAAR) 100 MG tablet TAKE 1 TABLET BY MOUTH  DAILY   naproxen sodium (ALEVE) 220 MG tablet Take 220 mg by mouth daily as needed.   olopatadine (PATANOL) 0.1 % ophthalmic solution Place 1 drop into both  eyes at bedtime.   omeprazole (PRILOSEC) 40 MG capsule TAKE 1 CAPSULE BY MOUTH  DAILY   psyllium (METAMUCIL) 58.6 % powder Take 1 packet by mouth at bedtime.   tamsulosin (FLOMAX) 0.4 MG CAPS capsule Take 0.4 mg by mouth at bedtime.   No facility-administered encounter medications on file as of 02/22/2021.   Patient Active Problem List   Diagnosis Date Noted   Stroke (cerebrum) ([place]) 02/04/2021   SIADH (syndrome of inappropriate ADH production) ([place]) 01/10/2021   Hyperlipidemia with target LDL less than 100 12/17/2020   Stroke ([place]) 12/09/2020   Cerebral infarction ([place]) 12/09/2020   Psychophysiological insomnia 11/21/2020   GAD (generalized anxiety disorder) 11/21/2020   BPH (benign prostatic hyperplasia)    Essential hypertension 11/08/2019   Gastroesophageal reflux disease without esophagitis 11/08/2019   Herpes simplex 11/08/2019   Primary erectile dysfunction 11/08/2019   Senile purpura ([place]) 05/13/2019   Hardening of the aorta (main artery of the heart) ([place]) 01/19/2019   Chronic allergic rhinitis due to pollen 03/24/2017   LPRD (laryngopharyngeal reflux disease) 03/24/2017   Bladder outflow obstruction 03/04/2016   Conditions to be addressed/monitored:  HTN, HLD, and CVA  Care Plan : RN Care Manager Plan of Care  Updates made by Knox Royalty, RN since 02/22/2021 12:00 AM     Problem: Chronic Disease Management Needs   Priority: Medium     Long-Range Goal: Development of plan od care for long term chronic disease management   Start Date: 02/20/2021  Expected End Date: 02/20/2022  Priority: Medium  Note:   Current Barriers:  Chronic Disease Management support and education needs related to HTN, HLD, and CVA Two recent hospitalizations for ischemic CVA: July 16-17, 2022 and September 11-12, 2022  RNCM Clinical Goal(s):  Patient will demonstrate ongoing health management independence with HTN, HLD, and CVA  through collaboration with RN Care manager, provider, and  care team.   Interventions: 1:1 collaboration with primary care provider regarding development and update of comprehensive plan of care as evidenced by provider attestation and co-signature Inter-disciplinary care team collaboration (see longitudinal plan of care) Evaluation of current treatment plan related to  self management and patient's adherence to plan as established by provider Discussed with patient CCM role- patient had numerous very good questions about purpose of CCM- he re-provides verbal consent today after our conversation and understands he can stop receiving CCM care management services at any point in the future should he wish to  02/22/21: Acute/ Unexpected incoming call from patient; today, Kenneth Montoya reports that he attended cardiology office visit and states "it didn't go well because I was too talkative; PA [name] did a great job and I am at his office now writing him a note of apology;" patient verbalizes today that after his initial hesitations to have loop recorder recorder, after office visit with cardiology yesterday, he has changed his mind and will proceed with procedure; he discusses his fears around general mortality post- recent CVA x 2, emotional/ spiritual support and encouragement provided.  Kenneth Montoya also has questions about his diagnosis of SIADH, atherosclerosis, and HLD; we discussed each of these briefly and I will provide additional printed education as available.  Patient also tells me that he was unable to re-set his [place]-- discussed with patient that I would place another printed AVS in mail to him today.  Patient reports feeling "better" and "re-assured/ encouraged" after acute phone call today.  Discussed with patient value of attending upcoming consult with cardiology (Dr. Quentin Ore) to have his questions/ fears addressed by provider- he verbalizes plans to attend appointment, and per cardiology office visit notes yesterday, reports that PA Richardson Dopp may  arrange for this consultation appointment to be moved to sooner date; patient states he will keep me posted.  We agreed to talk again (sooner than previously scheduled) by phone after the cardiology consultation appointment for loop recorder, currently scheduled for 03/21/21; our next phone call appointment was scheduled today for Friday, March 23, 2021 at 1:00 pm  From 02/20/21: Hyperlipidemia:  (Status: New goal.) Lab Results  Component Value Date   CHOL 120 02/05/2021   HDL 64 02/05/2021   LDLCALC 48 02/05/2021   TRIG 38 02/05/2021   CHOLHDL 1.9 02/05/2021  Medication review performed; medication list updated in electronic medical record.  Provider established cholesterol goals reviewed; Counseled on importance of regular laboratory monitoring as prescribed; Provided HLD educational materials; Reviewed role and benefits of statin for ASCVD risk reduction; Reviewed importance of limiting foods high in cholesterol; Assessed social determinant of health barriers;  Reviewed recent hospitalizations with patient; confirmed patient able to verbalize good understanding of hospitalizations, post- hospital discharge instructions, medications, and general plan of care Reviewed test results performed during hospitalization with patient- reviewed from general standpoint, encouraged patient to discuss specific details of testing/ any questions he has with care providers- encouraged him to discuss with cardiology provider during office visit tomorrow, which he verbalized plans to do Confirmed patient has very good understanding of overall goals for HLD levels and follows heart  healthy, low sodium, low cholesterol diet  Hypertension: (Status: New goal.) Last practice recorded BP readings:  BP Readings from Last 3 Encounters:  02/19/21 126/76  02/05/21 (!) 144/88  01/26/21 129/67  Most recent eGFR/CrCl: No results found for: EGFR  No components found for: CRCL  Evaluation of current treatment plan  related to hypertension self management and patient's adherence to plan as established by provider;   Reviewed prescribed diet heart healthy, low sodium, low cholesterol Discussed plans with patient for ongoing care management follow up and provided patient with direct contact information for care management team; Reviewed scheduled/upcoming provider appointments including:  Advised patient to discuss need for possible loop recorder with provider; Provided education on prescribed diet heart healthy, low salt, low cholesterol ;  Discussed complications of poorly controlled blood pressure such as heart disease, stroke, circulatory complications, vision complications, kidney impairment, sexual dysfunction;  Confirmed patient plans to attend cardiology provider office visit tomorrow- encouraged him to discuss with provider his feelings about suggested placement of loop recorder: patient verbalizes that at this point he does not wish to have this procedure, but he will make his mind up after discussing with cardiology provider tomorrow Confirmed patient periodically monitors and records his blood pressures at home: reviewed with patient today and he reports general ranges between 116- 135/ 50-60; reports most recently obtained blood pressure of "116/63"  Reviewed with patient signs/ symptoms CVA: he is familiar with many signs/ symptoms, will provide additional education through [place] action plan for development of signs/ symptoms CVA: patient verbalizes good understanding of need to call EMS right away for development of same  Patient Goals/Self-Care Activities: Patient will self administer medications as prescribed as evidenced by self report report  Patient will attend all scheduled provider appointments as evidenced by clinician review of documented attendance to scheduled appointments and patient report Patient will call pharmacy for medication refills as evidenced by patient report and  review of pharmacy fill history as appropriate Patient will attend church or other social activities as evidenced by patient report Patient will call provider office for new concerns or questions as evidenced by review of documented incoming telephone call notes and patient report Patient will continue to follow heart healthy, low salt, low cholesterol diet Patient will continue to stay active and social by periodically playing golf and attending church and other social groups Patient will discuss suggested procedure for loop recorder placement with his cardiology provider Patient will continue to occasionally monitor and write down on paper his blood pressure at home      Plan: Telephone follow up appointment with care management team member scheduled for:  Friday, March 23, 2021 at 1:00 pm The patient has been provided with contact information for the care management team and has been advised to call with any health related questions or concerns   Oneta Rack, RN, BSN, [place] [phone]: direct office [phone]: mobile

## 2021-02-22 NOTE — Patient Instructions (Signed)
Visit Information  Kenneth Montoya, it was nice talking with you today.    I look forward to talking to you again for an update on Friday, March 23, 2021 at 1:00 pm- please be listening out for my call that day.  I will call as close to 1:00 pm as possible.   If you need to cancel or re-schedule our telephone visit, please call [phone] and one of our care guides will be happy to assist you.   I look forward to hearing about your progress.   Please don't hesitate to contact me if I can be of assistance to you before our next scheduled telephone appointment.   Oneta Rack, RN, BSN, [facility] RN Care Coordination- [place] [phone]: direct office [phone]: mobile   PATIENT GOALS:  Goals Addressed             This Visit's Progress    Patient Self-Care Activities   On track    Timeframe:  Long-Range Goal Priority:  Medium Start Date:         02/20/21                    Expected End Date:   02/20/22                    Patient will self administer medications as prescribed as evidenced by self report report  Patient will attend all scheduled provider appointments as evidenced by clinician review of documented attendance to scheduled appointments and patient report Patient will call pharmacy for medication refills as evidenced by patient report and review of pharmacy fill history as appropriate Patient will attend church or other social activities as evidenced by patient report Patient will call provider office for new concerns or questions as evidenced by review of documented incoming telephone call notes and patient report Patient will continue to follow heart healthy, low salt, low cholesterol diet Patient will continue to stay active and social by periodically playing golf and attending church and other social groups Patient will discuss suggested procedure for loop recorder placement with his cardiology provider team Patient will continue to  occasionally monitor and write down on paper his blood pressure at home       Implantable Loop Recorder Placement An implantable loop recorder is a small electronic device that is placed under the skin of your chest. The device records the electrical activity of your heart over a long period of time. Your health care provider can download these recordings to monitor your heart. You may need an implantable loop recorder if you have periods of abnormal heart activity (arrhythmias) or unexplained fainting (syncope). The recorder can be left in place for 1 year or longer. Tell a health care provider about: Any allergies you have. All medicines you are taking, including vitamins, herbs, eye drops, creams, and over-the-counter medicines. Any problems you or family members have had with anesthetic medicines. Any blood disorders you have. Any surgeries you have had. Any medical conditions you have. Whether you are pregnant or may be pregnant. What are the risks? Generally, this is a safe procedure. However, problems may occur, including: Infection. Bleeding. Allergic reactions to anesthetic medicines. Damage to nerves or blood vessels. Failure of the device to work. This could require another surgery to replace it. What happens before the procedure?  You may have a physical exam, blood tests, and imaging tests of your heart, such as a chest X-ray. Follow instructions  from your health care provider about eating or drinking restrictions. Ask your health care provider about: Changing or stopping your regular medicines. This is especially important if you are taking diabetes medicines or blood thinners. Taking medicines such as aspirin and ibuprofen. These medicines can thin your blood. Do not take these medicines unless your health care provider tells you to take them. Taking over-the-counter medicines, vitamins, herbs, and supplements. Ask your health care provider how your surgical site will be  marked or identified. Ask your health care provider what steps will be taken to help prevent infection. These may include: Removing hair at the surgery site. Washing skin with a germ-killing soap. Plan to have someone take you home from the hospital or clinic. Plan to have a responsible adult care for you for at least 24 hours after you leave the hospital or clinic. This is important. Do not use any products that contain nicotine or tobacco, such as cigarettes and e-cigarettes. If you need help quitting, ask your health care provider. What happens during the procedure? An IV will be inserted into one of your veins. You may be given one or more of the following: A medicine to help you relax (sedative). A medicine to numb the area (local anesthetic). A small incision will be made on the left side of your upper chest. A pocket will be created under your skin. The device will be placed in the pocket. The incision will be closed with stitches (sutures) or adhesive strips. A bandage (dressing) will be placed over the incision. The procedure may vary among health care providers and hospitals. What happens after the procedure? Your blood pressure, heart rate, breathing rate, and blood oxygen level will be monitored until you leave the hospital or clinic. You may be able to go home on the day of your surgery. Before you go home: Your health care provider will program your recorder. You will learn how to trigger your device with a handheld activator. You will learn how to send recordings to your health care provider. You will get an ID card for your device, and you will be told when to use it. Do not drive for 24 hours if you were given a sedative during your procedure. Summary An implantable loop recorder is a small electronic device that is placed under the skin of your chest to monitor your heart over a long period of time. The recorder can be left in place for 1 year or longer. Plan to have  someone take you home from the hospital or clinic. This information is not intended to replace advice given to you by your health care provider. Make sure you discuss any questions you have with your health care provider. Document Revised: 02/01/2020 Document Reviewed: 06/28/2017 Elsevier Patient Education  2022 [place].  Syndrome of Inappropriate Antidiuretic Hormone Syndrome of inappropriate antidiuretic hormone (SIADH) is a condition in which extra antidiuretic hormone (ADH) is produced in the body. ADH controls water levels in the body. When too much of the hormone is produced, it can cause problems such as low salt (sodium) levels in your blood and reduced urine production. When this happens, the body retains too much water, which can cause various symptoms. Symptoms can range from mild to severe. SIADH is most common in people who are hospitalized for another reason. What are the causes? This condition may be caused by: Tumors. Injury to the brain. Infection or inflammation in the brain, such as meningitis or encephalitis. Lung infections or diseases.  Cancer. Major surgery. Extreme exercise. Stroke. Guillain-Barr syndrome (GBS). Medicines, including chemotherapy drugs, NSAIDs, and pain medicines. Problems with breathing (respiratory failure). AIDS. What increases the risk? The following factors may make you more likely to develop this condition: Increasing age. Recent major surgery. Hospitalization. What are the signs or symptoms? Symptoms of this condition include: Irritability. Muscle cramps. Nausea or vomiting. Mental changes, such as memory problems, aggressiveness, confusion, or hallucinations. Seizure. Coma. How is this diagnosed? This condition may be diagnosed based on: Your medical history. A physical exam. Blood tests. Urine tests. Chest X-ray or brain scan. How is this treated? Treatment for this condition focuses on relieving symptoms. Treatment may  include: Restricting fluid intake. Receiving sterile salt water (saline) slowly through an IV. Medicines to help remove water from the body (diuretics). Medicines to help control sodium levels. Initial treatment for SIADH is done in a hospital, so your health care team can monitor your condition closely. Other treatments may focus on treating the underlying cause of the condition. Follow these instructions at home: Follow your health care provider's instructions on restricting your intake of fluids. Take over-the-counter and prescription medicines only as told by your health care provider. Keep all follow-up visits as told by your health care provider. This is important. Contact a health care provider if you: Have muscle cramps. Are nauseous or you vomit. Get help right away if you have: A seizure. Loss of consciousness. Mental changes, such as memory problems, aggressiveness, confusion, or hallucinations. Summary Syndrome of inappropriate antidiuretic hormone (SIADH) is a condition in which extra antidiuretic hormone (ADH) is produced in the body. SIADH is most common in people who are hospitalized for another reason. Initial treatment for SIADH is done in the hospital setting in order to monitor your condition closely. Follow your health care provider's instructions on restricting your intake of fluids. This information is not intended to replace advice given to you by your health care provider. Make sure you discuss any questions you have with your health care provider. Document Revised: 06/05/2017 Document Reviewed: 06/05/2017 Elsevier Patient Education  2022 [employer].  The patient verbalized understanding of instructions, educational materials, and care plan provided today and agreed to receive a mailed copy of patient instructions, educational materials, and care plan Telephone follow up appointment with care management team member scheduled for:  Friday, March 23, 2021 at  1:00 pm The patient has been provided with contact information for the care management team and has been advised to call with any health related questions or concerns   Oneta Rack, RN, BSN, [place] [phone]: direct office [phone]: mobile

## 2021-02-23 DIAGNOSIS — E785 Hyperlipidemia, unspecified: Secondary | ICD-10-CM | POA: Diagnosis not present

## 2021-02-23 DIAGNOSIS — I10 Essential (primary) hypertension: Secondary | ICD-10-CM | POA: Diagnosis not present

## 2021-02-23 DIAGNOSIS — I63423 Cerebral infarction due to embolism of bilateral anterior cerebral arteries: Secondary | ICD-10-CM

## 2021-02-27 ENCOUNTER — Ambulatory Visit (INDEPENDENT_AMBULATORY_CARE_PROVIDER_SITE_OTHER): Payer: Medicare Other

## 2021-02-27 ENCOUNTER — Other Ambulatory Visit: Payer: Self-pay

## 2021-02-27 VITALS — BP 138/70 | HR 86 | Temp 98.4°F | Ht 69.0 in | Wt 193.8 lb

## 2021-02-27 DIAGNOSIS — Z0000 Encounter for general adult medical examination without abnormal findings: Secondary | ICD-10-CM

## 2021-02-27 NOTE — Patient Instructions (Addendum)
[name] , Thank you for taking time to come for your Medicare Wellness Visit. I appreciate your ongoing commitment to your health goals. Please review the following plan we discussed and let me know if I can assist you in the future.   Screening recommendations/referrals: Colonoscopy: 06/27/2020; due every 5 years Recommended yearly ophthalmology/optometry visit for glaucoma screening and checkup Recommended yearly dental visit for hygiene and checkup  Vaccinations: Influenza vaccine: declined Pneumococcal vaccine: 03/19/2013, 11/13/2020 Tdap vaccine: never done Shingles vaccine: 10/29/2020, 12/29/2020 Covid-19: 06/17/2019, 07/08/2019, 02/29/2020, 10/29/2020, 02/20/2021  Advanced directives: Please bring a copy of your health care power of attorney and living will to the office at your convenience.  Conditions/risks identified: Yes; Client understands the importance of follow-up with providers by attending scheduled visits and discussed goals to eat healthier, increase physical activity, exercise the brain, socialize more, get enough sleep and make time for laughter.  Next appointment: Please schedule your next Medicare Wellness Visit with your Nurse Health Advisor in 1 year by calling [phone].  Preventive Care 81 Years and Older, Male Preventive care refers to lifestyle choices and visits with your health care provider that can promote health and wellness. What does preventive care include? A yearly physical exam. This is also called an annual well check. Dental exams once or twice a year. Routine eye exams. Ask your health care provider how often you should have your eyes checked. Personal lifestyle choices, including: Daily care of your teeth and gums. Regular physical activity. Eating a healthy diet. Avoiding tobacco and drug use. Limiting alcohol use. Practicing safe sex. Taking low doses of aspirin every day. Taking vitamin and mineral supplements as recommended by your health  care provider. What happens during an annual well check? The services and screenings done by your health care provider during your annual well check will depend on your age, overall health, lifestyle risk factors, and family history of disease. Counseling  Your health care provider may ask you questions about your: Alcohol use. Tobacco use. Drug use. Emotional well-being. Home and relationship well-being. Sexual activity. Eating habits. History of falls. Memory and ability to understand (cognition). Work and work Statistician. Screening  You may have the following tests or measurements: Height, weight, and BMI. Blood pressure. Lipid and cholesterol levels. These may be checked every 5 years, or more frequently if you are over 52 years old. Skin check. Lung cancer screening. You may have this screening every year starting at age 57 if you have a 30-pack-year history of smoking and currently smoke or have quit within the past 15 years. Fecal occult blood test (FOBT) of the stool. You may have this test every year starting at age 6. Flexible sigmoidoscopy or colonoscopy. You may have a sigmoidoscopy every 5 years or a colonoscopy every 10 years starting at age 2. Prostate cancer screening. Recommendations will vary depending on your family history and other risks. Hepatitis C blood test. Hepatitis B blood test. Sexually transmitted disease (STD) testing. Diabetes screening. This is done by checking your blood sugar (glucose) after you have not eaten for a while (fasting). You may have this done every 1-3 years. Abdominal aortic aneurysm (AAA) screening. You may need this if you are a current or former smoker. Osteoporosis. You may be screened starting at age 82 if you are at high risk. Talk with your health care provider about your test results, treatment options, and if necessary, the need for more tests. Vaccines  Your health care provider may recommend certain vaccines, such  as: Influenza vaccine. This is recommended every year. Tetanus, diphtheria, and acellular pertussis (Tdap, Td) vaccine. You may need a Td booster every 10 years. Zoster vaccine. You may need this after age 17. Pneumococcal 13-valent conjugate (PCV13) vaccine. One dose is recommended after age 86. Pneumococcal polysaccharide (PPSV23) vaccine. One dose is recommended after age 79. Talk to your health care provider about which screenings and vaccines you need and how often you need them. This information is not intended to replace advice given to you by your health care provider. Make sure you discuss any questions you have with your health care provider. Document Released: 06/09/2015 Document Revised: 01/31/2016 Document Reviewed: 03/14/2015 Elsevier Interactive Patient Education  2017 [place] Prevention in the Home Falls can cause injuries. They can happen to people of all ages. There are many things you can do to make your home safe and to help prevent falls. What can I do on the outside of my home? Regularly fix the edges of walkways and driveways and fix any cracks. Remove anything that might make you trip as you walk through a door, such as a raised step or threshold. Trim any bushes or trees on the path to your home. Use bright outdoor lighting. Clear any walking paths of anything that might make someone trip, such as rocks or tools. Regularly check to see if handrails are loose or broken. Make sure that both sides of any steps have handrails. Any raised decks and porches should have guardrails on the edges. Have any leaves, snow, or ice cleared regularly. Use sand or salt on walking paths during winter. Clean up any spills in your garage right away. This includes oil or grease spills. What can I do in the bathroom? Use night lights. Install grab bars by the toilet and in the tub and shower. Do not use towel bars as grab bars. Use non-skid mats or decals in the tub or  shower. If you need to sit down in the shower, use a plastic, non-slip stool. Keep the floor dry. Clean up any water that spills on the floor as soon as it happens. Remove soap buildup in the tub or shower regularly. Attach bath mats securely with double-sided non-slip rug tape. Do not have throw rugs and other things on the floor that can make you trip. What can I do in the bedroom? Use night lights. Make sure that you have a light by your bed that is easy to reach. Do not use any sheets or blankets that are too big for your bed. They should not hang down onto the floor. Have a firm chair that has side arms. You can use this for support while you get dressed. Do not have throw rugs and other things on the floor that can make you trip. What can I do in the kitchen? Clean up any spills right away. Avoid walking on wet floors. Keep items that you use a lot in easy-to-reach places. If you need to reach something above you, use a strong step stool that has a grab bar. Keep electrical cords out of the way. Do not use floor polish or wax that makes floors slippery. If you must use wax, use non-skid floor wax. Do not have throw rugs and other things on the floor that can make you trip. What can I do with my stairs? Do not leave any items on the stairs. Make sure that there are handrails on both sides of the stairs and use them.  Fix handrails that are broken or loose. Make sure that handrails are as long as the stairways. Check any carpeting to make sure that it is firmly attached to the stairs. Fix any carpet that is loose or worn. Avoid having throw rugs at the top or bottom of the stairs. If you do have throw rugs, attach them to the floor with carpet tape. Make sure that you have a light switch at the top of the stairs and the bottom of the stairs. If you do not have them, ask someone to add them for you. What else can I do to help prevent falls? Wear shoes that: Do not have high heels. Have  rubber bottoms. Are comfortable and fit you well. Are closed at the toe. Do not wear sandals. If you use a stepladder: Make sure that it is fully opened. Do not climb a closed stepladder. Make sure that both sides of the stepladder are locked into place. Ask someone to hold it for you, if possible. Clearly mark and make sure that you can see: Any grab bars or handrails. First and last steps. Where the edge of each step is. Use tools that help you move around (mobility aids) if they are needed. These include: Canes. Walkers. Scooters. Crutches. Turn on the lights when you go into a dark area. Replace any light bulbs as soon as they burn out. Set up your furniture so you have a clear path. Avoid moving your furniture around. If any of your floors are uneven, fix them. If there are any pets around you, be aware of where they are. Review your medicines with your doctor. Some medicines can make you feel dizzy. This can increase your chance of falling. Ask your doctor what other things that you can do to help prevent falls. This information is not intended to replace advice given to you by your health care provider. Make sure you discuss any questions you have with your health care provider. Document Released: 03/09/2009 Document Revised: 10/19/2015 Document Reviewed: 06/17/2014 Elsevier Interactive Patient Education  2017 [employer].

## 2021-02-27 NOTE — Progress Notes (Signed)
Subjective:   Kenneth Montoya is a 80 y.o. male who presents for Medicare Annual/Subsequent preventive examination.  Review of Systems     Cardiac Risk Factors include: advanced age (>65men, >55 women);dyslipidemia;male gender;hypertension;family history of premature cardiovascular disease     Objective:    Today's Vitals   02/27/21 1014  BP: 138/70  Pulse: 86  Temp: 98.4 F (36.9 C)  SpO2: 97%  Weight: 193 lb 12.8 oz (87.9 kg)  Height: 5\' 9"  (1.753 m)  PainSc: 0-No pain   Body mass index is 28.62 kg/m.  Advanced Directives 02/27/2021 12/09/2020 07/10/2019  Does Patient Have a Medical Advance Directive? Yes No No  Type of Advance Directive Living will;Healthcare Power of Attorney - -  Does patient want to make changes to medical advance directive? No - Patient declined - -  Copy of [place] in Chart? No - copy requested - -  Would patient like information on creating a medical advance directive? - No - Patient declined No - Patient declined    Current Medications (verified) Outpatient Encounter Medications as of 02/27/2021  Medication Sig   acetaminophen (TYLENOL) 500 MG tablet Take 1,000 mg by mouth in the morning and at bedtime.   amLODipine (NORVASC) 10 MG tablet TAKE 1 TABLET BY MOUTH AT  BEDTIME   ascorbic acid (VITAMIN C) 1000 MG tablet Take 1,000 mg by mouth at bedtime.   aspirin EC 81 MG tablet Take 81 mg by mouth daily. Swallow whole.   atorvastatin (LIPITOR) 10 MG tablet TAKE 1 TABLET(10 MG) BY MOUTH DAILY   clopidogrel (PLAVIX) 75 MG tablet TAKE 1 TABLET(75 MG) BY MOUTH DAILY FOR 21 DAYS   fluticasone (FLONASE) 50 MCG/ACT nasal spray Place into both nostrils daily.   losartan (COZAAR) 100 MG tablet TAKE 1 TABLET BY MOUTH  DAILY   naproxen sodium (ALEVE) 220 MG tablet Take 220 mg by mouth daily as needed.   olopatadine (PATANOL) 0.1 % ophthalmic solution Place 1 drop into both eyes at bedtime.   omeprazole (PRILOSEC) 40 MG capsule TAKE 1  CAPSULE BY MOUTH  DAILY   psyllium (METAMUCIL) 58.6 % powder Take 1 packet by mouth at bedtime.   tamsulosin (FLOMAX) 0.4 MG CAPS capsule Take 0.4 mg by mouth at bedtime.   No facility-administered encounter medications on file as of 02/27/2021.    Allergies (verified) Patient has no known allergies.   History: Past Medical History:  Diagnosis Date   Arthritis    generalized   BPH (benign prostatic hyperplasia)    Cancer of skin, squamous cell    frozen    Cataract    bilateral sx   GERD (gastroesophageal reflux disease)    on meds   Hypertension    on meds   Shingles    Stroke [facility])    Past Surgical History:  Procedure Laterality Date   CATARACT EXTRACTION, BILATERAL     COLONOSCOPY  06/27/2020   COLONOSCOPY  2018   gum graft     INGUINAL HERNIA REPAIR     as infant   LUMBAR LAMINECTOMY  06/2015   POLYPECTOMY  2018   TA x 3   REVERSE TOTAL SHOULDER ARTHROPLASTY Bilateral    TONSILLECTOMY AND ADENOIDECTOMY     age 41    TOTAL KNEE ARTHROPLASTY Bilateral    TOTAL KNEE REVISION Right 12/25/2017   TRANSURETHRAL RESECTION OF PROSTATE     x 2   UPPER GASTROINTESTINAL ENDOSCOPY     Family History  Problem Relation Age of Onset   Colon polyps Mother 29   Stroke Maternal Grandfather    Colon cancer Neg Hx    Esophageal cancer Neg Hx    Rectal cancer Neg Hx    Stomach cancer Neg Hx    Social History   Socioeconomic History   Marital status: Married    Spouse name: Not on file   Number of children: Not on file   Years of education: Not on file   Highest education level: Not on file  Occupational History   Not on file  Tobacco Use   Smoking status: Never   Smokeless tobacco: Never  Vaping Use   Vaping Use: Never used  Substance and Sexual Activity   Alcohol use: Yes    Alcohol/week: 2.0 standard drinks    Types: 2 Standard drinks or equivalent per week    Comment: wine 1-2 x a week and not every week    Drug use: Never   Sexual activity: Not on file   Other Topics Concern   Not on file  Social History Narrative   Left handed   Social Determinants of Health   Financial Resource Strain: Low Risk    Difficulty of Paying Living Expenses: Not hard at all  Food Insecurity: No Food Insecurity   Worried About Charity fundraiser in the Last Year: Never true   Ran Out of Food in the Last Year: Never true  Transportation Needs: No Transportation Needs   Lack of Transportation (Medical): No   Lack of Transportation (Non-Medical): No  Physical Activity: Sufficiently Active   Days of Exercise per Week: 5 days   Minutes of Exercise per Session: 30 min  Stress: No Stress Concern Present   Feeling of Stress : Not at all  Social Connections: Socially Integrated   Frequency of Communication with Friends and Family: More than three times a week   Frequency of Social Gatherings with Friends and Family: More than three times a week   Attends Religious Services: More than 4 times per year   Active Member of [employer] or Organizations: Yes   Attends Archivist Meetings: 1 to 4 times per year   Marital Status: Married    Tobacco Counseling Counseling given: Not Answered   Clinical Intake:  Pre-visit preparation completed: Yes  Pain : No/denies pain Pain Score: 0-No pain     BMI - recorded: 28.62 Nutritional Status: BMI 25 -29 Overweight Nutritional Risks: None Diabetes: No  How often do you need to have someone help you when you read instructions, pamphlets, or other written materials from your doctor or pharmacy?: 1 - Never What is the last grade level you completed in school?: Bachelor's Degree; Retired [employer].  Diabetic? no  Interpreter Needed?: No  Information entered by :: Lisette Abu, LPN   Activities of Daily Living In your present state of health, do you have any difficulty performing the following activities: 02/27/2021 09/20/2020  Hearing? N N  Comment wears hearing aids -  Vision? N N   Difficulty concentrating or making decisions? N N  Walking or climbing stairs? N N  Dressing or bathing? N N  Doing errands, shopping? N N  Preparing Food and eating ? N -  Using the Toilet? N -  In the past six months, have you accidently leaked urine? N -  Do you have problems with loss of bowel control? N -  Managing your Medications? N -  Managing your Finances?  N -  Housekeeping or managing your Housekeeping? N -  Some recent data might be hidden    Patient Care Team: Janith Lima, MD as PCP - General (Internal Medicine) Belva Crome, MD as PCP - Cardiology (Cardiology) Knox Royalty, RN as Case Manager  Indicate any recent Medical Services you may have received from other than [facility] providers in the past year (date may be approximate).     Assessment:   This is a routine wellness examination for Dymond.  Hearing/Vision screen Hearing Screening - Comments:: Patient wears hearing aids. Vision Screening - Comments:: Patient wears corrective glasses/contacts.    Dietary issues and exercise activities discussed: Current Exercise Habits: Home exercise routine;Structured exercise class, Type of exercise: walking, Time (Minutes): 30, Frequency (Times/Week): 5, Weekly Exercise (Minutes/Week): 150, Intensity: Moderate, Exercise limited by: None identified   Goals Addressed   None   Depression Screen PHQ 2/9 Scores 02/27/2021 09/20/2020  PHQ - 2 Score 0 0    Fall Risk Fall Risk  02/27/2021 02/20/2021  Falls in the past year? 0 0  Number falls in past yr: 0 0  Injury with Fall? 0 0  Comment - N/A- no falls reported  Risk for fall due to : No Fall Risks Medication side effect  Follow up Falls evaluation completed Falls prevention discussed    FALL RISK PREVENTION PERTAINING TO THE HOME:  Any stairs in or around the home? No  If so, are there any without handrails? No  Home free of loose throw rugs in walkways, pet beds, electrical cords, etc? Yes  Adequate lighting  in your home to reduce risk of falls? Yes   ASSISTIVE DEVICES UTILIZED TO PREVENT FALLS:  Life alert? No  Use of a cane, walker or w/c? No  Grab bars in the bathroom? Yes  Shower chair or bench in shower? Yes  Elevated toilet seat or a handicapped toilet? Yes   TIMED UP AND GO:  Was the test performed? Yes .  Length of time to ambulate 10 feet: 10 sec.   Gait slow and steady without use of assistive device  Cognitive Function: Normal cognitive status assessed by direct observation by this Nurse Health Advisor. No abnormalities found.          Immunizations Immunization History  Administered Date(s) Administered   Influenza,inj,quad, With Preservative 03/29/2017, 03/31/2018, 03/08/2019   PFIZER(Purple Top)SARS-COV-2 Vaccination 06/17/2019, 06/28/2019, 02/29/2020, 10/29/2020, 02/20/2021   PNEUMOCOCCAL CONJUGATE-20 11/13/2020   Td (Adult) 05/27/2012   Zoster Recombinat (Shingrix) 10/29/2020, 12/29/2020   Zoster, Live 02/25/2015    TDAP status: Due, Education has been provided regarding the importance of this vaccine. Advised may receive this vaccine at local pharmacy or Health Dept. Aware to provide a copy of the vaccination record if obtained from local pharmacy or Health Dept. Verbalized acceptance and understanding.  Flu Vaccine status: Due, Education has been provided regarding the importance of this vaccine. Advised may receive this vaccine at local pharmacy or Health Dept. Aware to provide a copy of the vaccination record if obtained from local pharmacy or Health Dept. Verbalized acceptance and understanding.  Pneumococcal vaccine status: Up to date  Covid-19 vaccine status: Completed vaccines  Qualifies for Shingles Vaccine? Yes   Zostavax completed Yes   Shingrix Completed?: Yes  Screening Tests Health Maintenance  Topic Date Due   TETANUS/TDAP  Never done   INFLUENZA VACCINE  08/24/2021 (Originally 12/25/2020)   COLONOSCOPY (Pts 45-21yrs Insurance coverage will  need to be confirmed)  06/27/2025  COVID-19 Vaccine  Completed   Zoster Vaccines- Shingrix  Completed   HPV VACCINES  Aged Out    Health Maintenance  Health Maintenance Due  Topic Date Due   TETANUS/TDAP  Never done    Colorectal cancer screening: Type of screening: Colonoscopy. Completed 06/27/2020. Repeat every 5 years  Lung Cancer Screening: (Low Dose CT Chest recommended if Age 36-80 years, 30 pack-year currently smoking OR have quit w/in 15years.) does not qualify.   Lung Cancer Screening Referral: no  Additional Screening:  Hepatitis C Screening: does not qualify; Completed no  Vision Screening: Recommended annual ophthalmology exams for early detection of glaucoma and other disorders of the eye. Is the patient up to date with their annual eye exam?  Yes  Who is the provider or what is the name of the office in which the patient attends annual eye exams? [facility] If pt is not established with a provider, would they like to be referred to a provider to establish care? No .   Dental Screening: Recommended annual dental exams for proper oral hygiene  Community Resource Referral / Chronic Care Management: CRR required this visit?  No   CCM required this visit?  No      Plan:     I have personally reviewed and noted the following in the patient's chart:   Medical and social history Use of alcohol, tobacco or illicit drugs  Current medications and supplements including opioid prescriptions. Patient is not currently taking opioid prescriptions. Functional ability and status Nutritional status Physical activity Advanced directives List of other physicians Hospitalizations, surgeries, and ER visits in previous 12 months Vitals Screenings to include cognitive, depression, and falls Referrals and appointments  In addition, I have reviewed and discussed with patient certain preventive protocols, quality metrics, and best practice recommendations. A written  personalized care plan for preventive services as well as general preventive health recommendations were provided to patient.     Sheral Flow, LPN   [id number]   Nurse Notes:  Hearing Screening - Comments:: Patient wears hearing aids. Vision Screening - Comments:: Patient wears corrective glasses/contacts.

## 2021-03-21 ENCOUNTER — Institutional Professional Consult (permissible substitution): Payer: 59 | Admitting: Cardiology

## 2021-03-23 ENCOUNTER — Ambulatory Visit (INDEPENDENT_AMBULATORY_CARE_PROVIDER_SITE_OTHER): Payer: Medicare Other | Admitting: *Deleted

## 2021-03-23 DIAGNOSIS — I10 Essential (primary) hypertension: Secondary | ICD-10-CM

## 2021-03-23 DIAGNOSIS — E785 Hyperlipidemia, unspecified: Secondary | ICD-10-CM

## 2021-03-23 NOTE — Patient Instructions (Signed)
Visit Information  Rush Landmark, it was nice talking with you today.  You and Vaughan Basta will remain in my thoughts and prayers in the coming days as you navigate Linda's new health care needs.   I look forward to talking to you again for a telephone update on Tuesday, May 01, 2021 at 1:00 pm- please be listening out for my call that day.  I will call as close to 1:00 pm as possible.   If you need to cancel or re-schedule our telephone visit, please call [phone] and one of our care guides will be happy to assist you.   I look forward to hearing about your progress.   Please don't hesitate to contact me if I can be of assistance to you before our next scheduled telephone appointment.   Oneta Rack, RN, BSN, [facility] RN Care Coordination- [place] [phone]: direct office [phone]: mobile   PATIENT GOALS:  Goals Addressed             This Visit's Progress    Patient Self-Care Activities   On track    Timeframe:  Long-Range Goal Priority:  Medium Start Date:         02/20/21                    Expected End Date:   02/20/22                    Patient will self administer medications as prescribed  Patient will attend all scheduled provider appointments  Patient will call pharmacy for medication refills  Patient will attend church or other social activities  Patient will call provider office for new concerns or questions  Patient will continue to follow heart healthy, low salt, low cholesterol diet Patient will continue to stay active and social by periodically playing golf and attending church and other social groups Patient will continue to occasionally monitor and write down on paper his blood pressure at home       The patient verbalized understanding of instructions, educational materials, and care plan provided today and agreed to receive a mailed copy of patient instructions, educational materials, and care plan Telephone follow up  appointment with care management team member scheduled for:  Tuesday, May 01, 2021 at 1:00 pm The patient has been provided with contact information for the care management team and has been advised to call with any health related questions or concerns  Oneta Rack, RN, BSN, [place] [phone]: direct office [phone]: mobile

## 2021-03-23 NOTE — Chronic Care Management (AMB) (Signed)
Chronic Care Management   CCM RN Visit Note  03/23/2021 Name: TALEN POSER MRN: [id number] DOB: May 27, 1941  Subjective: Kenneth Montoya is a 80 y.o. year old male who is a primary care patient of Janith Lima, MD. The care management team was consulted for assistance with disease management and care coordination needs.    Engaged with patient by telephone for follow up visit in response to provider referral for case management and/or care coordination services.   Consent to Services:  The patient was given information about Chronic Care Management services, agreed to services, and gave verbal consent prior to initiation of services.  Please see initial visit note for detailed documentation.  Patient agreed to services and verbal consent obtained.   Assessment: Review of patient past medical history, allergies, medications, health status, including review of consultants reports, laboratory and other test data, was performed as part of comprehensive evaluation and provision of chronic care management services.   CCM Care Plan No Known Allergies  Outpatient Encounter Medications as of 03/23/2021  Medication Sig   acetaminophen (TYLENOL) 500 MG tablet Take 1,000 mg by mouth in the morning and at bedtime.   amLODipine (NORVASC) 10 MG tablet TAKE 1 TABLET BY MOUTH AT  BEDTIME   ascorbic acid (VITAMIN C) 1000 MG tablet Take 1,000 mg by mouth at bedtime.   aspirin EC 81 MG tablet Take 81 mg by mouth daily. Swallow whole.   atorvastatin (LIPITOR) 10 MG tablet TAKE 1 TABLET(10 MG) BY MOUTH DAILY   clopidogrel (PLAVIX) 75 MG tablet TAKE 1 TABLET(75 MG) BY MOUTH DAILY FOR 21 DAYS   fluticasone (FLONASE) 50 MCG/ACT nasal spray Place into both nostrils daily.   losartan (COZAAR) 100 MG tablet TAKE 1 TABLET BY MOUTH  DAILY   naproxen sodium (ALEVE) 220 MG tablet Take 220 mg by mouth daily as needed.   olopatadine (PATANOL) 0.1 % ophthalmic solution Place 1 drop into both eyes at bedtime.    omeprazole (PRILOSEC) 40 MG capsule TAKE 1 CAPSULE BY MOUTH  DAILY   psyllium (METAMUCIL) 58.6 % powder Take 1 packet by mouth at bedtime.   tamsulosin (FLOMAX) 0.4 MG CAPS capsule Take 0.4 mg by mouth at bedtime.   No facility-administered encounter medications on file as of 03/23/2021.   Patient Active Problem List   Diagnosis Date Noted   Stroke (cerebrum) ([place]) 02/04/2021   SIADH (syndrome of inappropriate ADH production) ([place]) 01/10/2021   Hyperlipidemia with target LDL less than 100 12/17/2020   Stroke ([place]) 12/09/2020   Cerebral infarction ([place]) 12/09/2020   Psychophysiological insomnia 11/21/2020   GAD (generalized anxiety disorder) 11/21/2020   BPH (benign prostatic hyperplasia)    Essential hypertension 11/08/2019   Gastroesophageal reflux disease without esophagitis 11/08/2019   Herpes simplex 11/08/2019   Primary erectile dysfunction 11/08/2019   Senile purpura ([place]) 05/13/2019   Hardening of the aorta (main artery of the heart) ([place]) 01/19/2019   Chronic allergic rhinitis due to pollen 03/24/2017   LPRD (laryngopharyngeal reflux disease) 03/24/2017   Bladder outflow obstruction 03/04/2016   Conditions to be addressed/monitored:  HTN, HLD, and recent CVA  Care Plan : RN Care Manager Plan of Care  Updates made by Knox Royalty, RN since 03/23/2021 12:00 AM     Problem: Chronic Disease Management Needs   Priority: Medium     Long-Range Goal: Development of plan od care for long term chronic disease management   Start Date: 02/20/2021  Expected End Date: 02/20/2022  Priority: Medium  Note:   Current Barriers:  Chronic Disease Management support and education needs related to HTN, HLD, and CVA Two recent hospitalizations for ischemic CVA: July 16-17, 2022 and September 11-12, 2022 03/23/21: patient reports wife has been recently diagnosed with Stage 4 ovarian cancer, hospitalized for one week, and he is now providing care for spouse; verbalizes plans to take spouse  to [facility] [place] for treatment: patient remains anxious and under considerable stress, especially with this new development  RNCM Clinical Goal(s):  Patient will demonstrate ongoing health management independence with HTN, HLD, and CVA  through collaboration with RN Care manager, provider, and care team.   Interventions: 1:1 collaboration with primary care provider regarding development and update of comprehensive plan of care as evidenced by provider attestation and co-signature Inter-disciplinary care team collaboration (see longitudinal plan of care) Evaluation of current treatment plan related to  self management and patient's adherence to plan as established by provider Discussed with patient CCM role- patient had numerous very good questions about purpose of CCM- he re-provides verbal consent today after our conversation and understands he can stop receiving CCM care management services at any point in the future should he wish to  Hyperlipidemia:  (Status: Goal on track: YES.) Lab Results  Component Value Date   CHOL 120 02/05/2021   HDL 64 02/05/2021   [place] 48 02/05/2021   TRIG 38 02/05/2021   CHOLHDL 1.9 02/05/2021  Reviewed importance of limiting foods high in cholesterol; Confirmed patient continues taking medications independently and continues to verbalize very good understanding of overall healthcare goals  Encouraged ongoing self-care in light of his wife's recent diagnosis Encouraged continuation of heart healthy, low sodium, low cholesterol diet  Hypertension: (Status: Goal on track: YES.) Last practice recorded BP readings:  BP Readings from Last 3 Encounters:  02/19/21 126/76  02/05/21 (!) 144/88  01/26/21 129/67  Most recent eGFR/CrCl: No results found for: EGFR  No components found for: CRCL  Evaluation of current treatment plan related to hypertension self management and patient's adherence to plan as established by provider;   Reviewed prescribed diet  heart healthy, low sodium, low cholesterol Discussed plans with patient for ongoing care management follow up and provided patient with direct contact information for care management team; Reports taking medications as prescribed, denies concerns/ issues around medications: continues to independently self-administer/ manage Discussed with patient plans to obtain loop recorder: he reports he has decided to not pursue this; medical visits cause him too much anxiety; he remains frustrated with health care in general Confirmed not monitoring blood pressures at home currently- too consumed with caring for spouse: emotional support provided throughout call; he confirms his family (adult children) are providing support and are involved- he declines need for additional assistance, just asks for prayer, which was provided; spiritual support provided Confirmed no current personal clinical concerns Confirmed no upcoming provider appointments: states he is trying to avoid doctors as health care makes him very anxious  Patient Goals/Self-Care Activities: Patient will self administer medications as prescribed as evidenced by self report report  Patient will attend all scheduled provider appointments as evidenced by clinician review of documented attendance to scheduled appointments and patient report Patient will call pharmacy for medication refills as evidenced by patient report and review of pharmacy fill history as appropriate Patient will attend church or other social activities as evidenced by patient report Patient will call provider office for new concerns or questions as evidenced by review of documented incoming telephone call notes  and patient report Patient will continue to follow heart healthy, low salt, low cholesterol diet Patient will continue to stay active and social by periodically playing golf and attending church and other social groups Patient will continue to occasionally monitor and write  down on paper his blood pressure at home           Plan: Telephone follow up appointment with care management team member scheduled for:  Tuesday, May 01, 2021 at 1:00 pm The patient has been provided with contact information for the care management team and has been advised to call with any health related questions or concerns  Oneta Rack, RN, BSN, [place] [phone]: direct office [phone]: mobile

## 2021-03-26 DIAGNOSIS — I10 Essential (primary) hypertension: Secondary | ICD-10-CM

## 2021-03-26 DIAGNOSIS — E785 Hyperlipidemia, unspecified: Secondary | ICD-10-CM | POA: Diagnosis not present

## 2021-05-01 ENCOUNTER — Telehealth: Payer: Medicare Other

## 2021-05-01 ENCOUNTER — Telehealth: Payer: Self-pay | Admitting: *Deleted

## 2021-05-01 ENCOUNTER — Encounter: Payer: Self-pay | Admitting: *Deleted

## 2021-05-01 NOTE — Telephone Encounter (Signed)
  Chronic Care Management   Follow Up Note   05/01/2021 Name: TOMIO KIRK MRN: [id number] DOB: 06/11/1940  Referred by: Janith Lima, MD Reason for referral : Chronic Care Management (CCM RN CM Follow Up Telephone Outreach, HTN/ HLD/ CVA- Unsuccessful Attempt)  An unsuccessful telephone outreach was attempted today. The patient was referred to the case management team for assistance with care management and care coordination.   Follow Up Plan:  A HIPPA compliant phone message was left for the patient providing contact information and requesting a return call Will place request with scheduling care guide to contact patient to re-schedule today's missed CCM RN follow up telephone appointment if I do not hear back from patient by end of day  Oneta Rack, RN, BSN, [place] [phone]: direct office

## 2021-05-02 ENCOUNTER — Telehealth: Payer: Self-pay | Admitting: *Deleted

## 2021-05-02 NOTE — Chronic Care Management (AMB) (Signed)
  Care Management   Note  05/02/2021 Name: ALLON COSTLOW MRN: [id number] DOB: [birth year]  ORLONDO HOLYCROSS is a 80 y.o. year old male who is a primary care patient of Janith Lima, MD and is actively engaged with the care management team. I reached out to [name] by phone today to assist with re-scheduling a follow up visit with the RN Case Manager  Follow up plan: Telephone appointment with care management team member scheduled for:05/14/21  [facility]  Direct Dial: [phone]

## 2021-05-14 ENCOUNTER — Ambulatory Visit (INDEPENDENT_AMBULATORY_CARE_PROVIDER_SITE_OTHER): Payer: Medicare Other | Admitting: *Deleted

## 2021-05-14 DIAGNOSIS — E785 Hyperlipidemia, unspecified: Secondary | ICD-10-CM

## 2021-05-14 DIAGNOSIS — I10 Essential (primary) hypertension: Secondary | ICD-10-CM

## 2021-05-14 DIAGNOSIS — I63423 Cerebral infarction due to embolism of bilateral anterior cerebral arteries: Secondary | ICD-10-CM

## 2021-05-14 NOTE — Patient Instructions (Signed)
Visit Information  Thank you for taking time to talk with me today. Please don't hesitate to contact me if I can be of assistance to you before our next scheduled telephone appointment.  Below are the goals we discussed today:  Patient Self-Care Activities: Patient Kenneth Montoya will:  Take medications as prescribed  Attend all scheduled provider appointments  Call pharmacy for medication refills  Attend church or other social activities  Call provider office for new concerns or questions  Continue to follow heart healthy, low salt, low cholesterol diet Continue to stay active and social by periodically walking for exercise, playing golf and attending church and other social groups Continue to occasionally monitor and write down on paper his blood pressure at home  Our next scheduled telephone follow up visit/ appointment with care management team member is scheduled on:  Monday, July 16, 2021 at 9:45 am  If you need to cancel or re-schedule our visit, please call [phone] and our care guide team will be happy to assist you.   I look forward to hearing about your progress.   Oneta Rack, RN, BSN, [place] [phone]: direct office  If you are experiencing a Mental Health or [place] or need someone to talk to, please  call the Suicide and Crisis Lifeline: 988 call the [place] National Suicide Prevention Lifeline: (747)169-8738 or TTY: (847) 650-4915 TTY 706 444 6660) to talk to a trained counselor call 1-800-273-TALK (toll free, 24 hour hotline) go to [facility] [address] [place] [phone]) call 911   The patient verbalized understanding of instructions, educational materials, and care plan provided today and agreed to receive a mailed copy of patient instructions, educational materials, and care plan   Exercise Information for Aging Adults Staying  physically active is important as you age. Physical activity and exercise can help in maintaining quality of life, health, physical function, and reducing falls. The four types of exercises that are best for older adults are endurance, strength, balance, and flexibility. Contact your health care provider before you start any exercise routine. Ask your health care provider what activities are safe for you. What are the risks? Risks associated with exercising include: Overdoing it. This may lead to sore muscles or fatigue. Falls. Injuries. Dehydration. How to do these exercises Endurance exercises Endurance (aerobic) exercises raise your breathing rate and heart rate. Increasing your endurance helps you do everyday tasks and stay healthy. By improving the health of your body system that includes your heart, lungs, and blood vessels (circulatory system), you may also delay or prevent diseases such as heart disease, diabetes, and weak bones (osteoporosis). Types of endurance exercises include: Sports. Indoor activities, such as using gym equipment, doing water aerobics, or dancing. Outdoor activities, such as biking or jogging. Tasks around the house, such as gardening, yard work, and heavy household chores like cleaning. Walking, such as hiking or walking around your neighborhood. When doing endurance exercises, make sure you: Are aware of your surroundings. Use safety equipment as directed. Dress in layers when exercising outdoors. Drink plenty of water to stay well hydrated. Build up endurance slowly. Start with 10 minutes at a time, and gradually build up to doing 30 minutes at a time. Unless your health care provider gave you different instructions, aim to exercise for a total of 150 minutes a week. Spread out that time so you are working on endurance 3 or more days a week. Strength exercises  Lifting, pulling, or pushing weights helps to strengthen muscles. Having stronger muscles makes it  easier to do everyday activities, such as getting up from a chair, climbing stairs, carrying groceries, and playing with grandchildren. Strength exercises include arm and leg exercises that may be done: With weights. Without weights (using your own body weight). With a resistance band. When doing strength exercises: Move smoothly and steadily. Do not suddenly thrust or jerk the weights, the resistance band, or your body. Start with no weights or with light weights, and gradually add more weight over time. Eventually, aim to use weights that are hard or very hard for you to lift. This means that you are able to do 8 repetitions with the weight, and the last few repetitions are very challenging. Lift or push weights into position for 3 seconds, hold the position for 1 second, and then take 3 seconds to return to your starting position. Breathe out (exhale) during difficult movements, like lifting or pushing weights. Breathe in (inhale) to relax your muscles before the next repetition. Consider alternating arms or legs, especially when you first start strength exercises. Expect some slight muscle soreness after each session. Do strength exercises on 2 or more days a week, for 30 minutes at a time. Avoid exercising the same muscle groups two days in a row. For example, if you work on your leg muscles one day, work on your arm muscles the next day. When you can do two sets of 10-15 repetitions with a certain weight, increase the amount of weight. Balance exercises Balance exercises can help to prevent falls. Balance exercises include: Standing on one foot. Heel-to-toe walk. Balance walk. Tai chi. Make sure you have something sturdy to hold onto while doing balance exercises, such as a sturdy chair. As your balance improves, challenge yourself by holding on to the chair with one hand instead of two, and then with no hands. Trying exercises with your eyes closed also challenges your balance, but be sure  to have a sturdy surface (like a countertop) close by in case you need it. Do balance exercises as often as you want, or as often as directed by your health care provider. Flexibility exercises Flexibility exercises improve how far you can bend, straighten, move, or rotate parts of your body (range of motion). These exercises also help you do everyday activities such as getting dressed or reaching for objects. Flexibility exercises include stretching different parts of the body, and they may be done in a standing or seated position or on the floor. When stretching, make sure you: Keep a slight bend in your arms and legs. Avoid completely straightening ("locking") your joints. Do not stretch so far that you feel pain. You should feel a mild stretching feeling. You may try stretching farther as you become more flexible over time. Relax and breathe between stretches. Hold on to something sturdy for balance as needed. Hold each stretch for 10-30 seconds. Repeat each stretch 3-5 times. General safety tips Exercise in well-lit areas. Do not hold your breath during exercises or stretches. Warm up before exercising, and cool down after exercising. This can help prevent injury. Drink plenty of water during exercise or any activity that makes you sweat. If you are not sure if an exercise is safe for you, or you are not sure how to do an exercise, talk with your health care provider. This is especially important if you have had surgery on muscles, bones, or joints (orthopedic surgery). Where to find more information  You can find more information about exercise for older adults from: Your local health department, fitness center, or community center. These facilities may have programs for aging adults. [employer] on Aging: http://kim-miller.com/ National Council on Aging: www.ncoa.org Summary Staying physically active is important as you age. Doing endurance, strength, balance, and flexibility exercises  can help in maintaining quality of life, health, physical function, and reducing falls. Make sure to contact your health care provider before you start any exercise routine. Ask your health care provider what activities are safe for you. This information is not intended to replace advice given to you by your health care provider. Make sure you discuss any questions you have with your health care provider. Document Revised: 09/25/2020 Document Reviewed: 09/25/2020 Elsevier Patient Education  [place].

## 2021-05-14 NOTE — Chronic Care Management (AMB) (Signed)
Chronic Care Management   CCM RN Visit Note  05/14/2021 Name: JERRIE GULLO MRN: [id number] DOB: November 04, 1940  Subjective: BILAAL LEIB is a 80 y.o. year old male who is a primary care patient of Janith Lima, MD. The care management team was consulted for assistance with disease management and care coordination needs.    Engaged with patient by telephone for follow up visit in response to provider referral for case management and/or care coordination services.   Consent to Services:  The patient was given information about Chronic Care Management services, agreed to services, and gave verbal consent prior to initiation of services.  Please see initial visit note for detailed documentation.  Patient agreed to services and verbal consent obtained.   Assessment: Review of patient past medical history, allergies, medications, health status, including review of consultants reports, laboratory and other test data, was performed as part of comprehensive evaluation and provision of chronic care management services.   SDOH (Social Determinants of Health) assessments and interventions performed:  SDOH Interventions    Flowsheet Row Most Recent Value  SDOH Interventions   Food Insecurity Interventions Intervention Not Indicated  Housing Interventions Intervention Not Indicated  Transportation Interventions Intervention Not Indicated  [Patient continues driving self]       CCM Care Plan No Known Allergies  Outpatient Encounter Medications as of 05/14/2021  Medication Sig   acetaminophen (TYLENOL) 500 MG tablet Take 1,000 mg by mouth in the morning and at bedtime.   amLODipine (NORVASC) 10 MG tablet TAKE 1 TABLET BY MOUTH AT  BEDTIME   ascorbic acid (VITAMIN C) 1000 MG tablet Take 1,000 mg by mouth at bedtime.   aspirin EC 81 MG tablet Take 81 mg by mouth daily. Swallow whole.   atorvastatin (LIPITOR) 10 MG tablet TAKE 1 TABLET(10 MG) BY MOUTH DAILY   clopidogrel (PLAVIX) 75 MG  tablet TAKE 1 TABLET(75 MG) BY MOUTH DAILY FOR 21 DAYS   fluticasone (FLONASE) 50 MCG/ACT nasal spray Place into both nostrils daily.   losartan (COZAAR) 100 MG tablet TAKE 1 TABLET BY MOUTH  DAILY   naproxen sodium (ALEVE) 220 MG tablet Take 220 mg by mouth daily as needed.   olopatadine (PATANOL) 0.1 % ophthalmic solution Place 1 drop into both eyes at bedtime.   omeprazole (PRILOSEC) 40 MG capsule TAKE 1 CAPSULE BY MOUTH  DAILY   psyllium (METAMUCIL) 58.6 % powder Take 1 packet by mouth at bedtime.   tamsulosin (FLOMAX) 0.4 MG CAPS capsule Take 0.4 mg by mouth at bedtime.   No facility-administered encounter medications on file as of 05/14/2021.   Patient Active Problem List   Diagnosis Date Noted   Stroke (cerebrum) ([place]) 02/04/2021   SIADH (syndrome of inappropriate ADH production) ([place]) 01/10/2021   Hyperlipidemia with target LDL less than 100 12/17/2020   Stroke ([place]) 12/09/2020   Cerebral infarction ([place]) 12/09/2020   Psychophysiological insomnia 11/21/2020   GAD (generalized anxiety disorder) 11/21/2020   BPH (benign prostatic hyperplasia)    Essential hypertension 11/08/2019   Gastroesophageal reflux disease without esophagitis 11/08/2019   Herpes simplex 11/08/2019   Primary erectile dysfunction 11/08/2019   Senile purpura ([place]) 05/13/2019   Hardening of the aorta (main artery of the heart) ([place]) 01/19/2019   Chronic allergic rhinitis due to pollen 03/24/2017   LPRD (laryngopharyngeal reflux disease) 03/24/2017   Bladder outflow obstruction 03/04/2016   Conditions to be addressed/monitored:  HTN and HLD  Care Plan : RN Care Manager Plan of Care  Updates made  by Knox Royalty, RN since 05/14/2021 12:00 AM     Problem: Chronic Disease Management Needs   Priority: Medium     Long-Range Goal: Development of plan od care for long term chronic disease management   Start Date: 02/20/2021  Expected End Date: 02/20/2022  Priority: Medium  Note:   Current Barriers:   Chronic Disease Management support and education needs related to HTN, HLD, and CVA Two recent hospitalizations for ischemic CVA: July 16-17, 2022 and September 11-12, 2022 03/23/21: patient reports wife has been recently diagnosed with Stage 4 ovarian cancer, hospitalized for one week, and he is now providing care for spouse; verbalizes plans to take spouse to [facility] in [place] for treatment: patient remains anxious and under considerable stress, especially with this new development; update 05/14/21: reports wife "doing well;" attending chemo/ oncology provider office visit through [facility]- reports happy with wife's care; denies need for caregiver support  RNCM Clinical Goal(s):  Patient will demonstrate ongoing health management independence with HTN, HLD, and CVA  through collaboration with RN Care manager, provider, and care team.   Interventions: 1:1 collaboration with primary care provider regarding development and update of comprehensive plan of care as evidenced by provider attestation and co-signature Inter-disciplinary care team collaboration (see longitudinal plan of care) Evaluation of current treatment plan related to  self management and patient's adherence to plan as established by provider SDOH updated- no concerns identified Falls assessment updated: reports no new/ recent falls- does not use assistive devices; feels "steady" when walking Pain assessment updated: denies acute/ chronic pain  Hyperlipidemia:  (Status: Goal on Track (progressing): YES.) Lab Results  Component Value Date   CHOL 120 02/05/2021   HDL 64 02/05/2021   [place] 48 02/05/2021   TRIG 38 02/05/2021   CHOLHDL 1.9 02/05/2021  Counseled on importance of regular laboratory monitoring as prescribed; Reviewed importance of limiting foods high in cholesterol; Reviewed exercise goals and target of 150 minutes per week; Screening for signs and symptoms of depression related to chronic disease state;   Assessed social determinant of health barriers;  Confirmed patient continues taking medications independently and continues to deny medication concerns/ verbalizes very good understanding of overall healthcare goals  Encouraged ongoing self-care in light of his wife's recent diagnosis Encouraged continuation of heart healthy, low sodium, low cholesterol diet  Hypertension: (Status: Goal on Track (progressing): YES.) Last practice recorded BP readings:  BP Readings from Last 3 Encounters:  02/27/21 138/70  02/21/21 (!) 158/72  02/19/21 126/76  Most recent eGFR/CrCl: No results found for: EGFR  No components found for: CRCL  Evaluation of current treatment plan related to hypertension self management and patient's adherence to plan as established by provider;   Reviewed prescribed diet heart healthy, low sodium, low cholesterol Discussed plans with patient for ongoing care management follow up and provided patient with direct contact information for care management team; Reports taking medications as prescribed, denies concerns/ issues around medications: continues to independently self-administer/ manage Confirmed occasionally monitoring blood pressures at home currently- reviewed recent blood pressures at home: he reports "all are fine;" he is able to recall blood pressure yesterday of "122/62"- encouraged ongoing monitoring/ surveillance of blood pressures at home Reviewed upcoming provider office visits: March 21/22- neurology provider Reviewed recent weights at home: he monitors occasionally and reports most recent weight at home of "190 lbs;" discussed challenges of following recommended diet around holidays; discussed strategies for dietary adherence, including portion control and minimal salt intake Confirmed patient  has been walking for exercise: reports walks > 2 miles "a couple of times each week;" states able to walk a mile in "25 minutes"-- positive reinforcement provided with  encouragement to continue efforts to remain active  Patient Goals/Self-Care Activities: As evidenced by review of EHR, collaboration with care team, and patient reporting during CCM RN CM outreach,  Patient [place]mark will:  Take medications as prescribed  Attend all scheduled provider appointments  Call pharmacy for medication refills  Attend church or other social activities  Call provider office for new concerns or questions  Continue to follow heart healthy, low salt, low cholesterol diet Continue to stay active and social by periodically walking for exercise, playing golf and attending church and other social groups Continue to occasionally monitor and write down on paper his blood pressure at home      Plan: Telephone follow up appointment with care management team member scheduled for:  Monday, July 16, 2021 at 9:45 am The patient has been provided with contact information for the care management team and has been advised to call with any health related questions or concerns  Oneta Rack, RN, BSN, [place] [phone]: direct office

## 2021-05-25 ENCOUNTER — Telehealth: Payer: Self-pay | Admitting: Internal Medicine

## 2021-05-25 NOTE — Telephone Encounter (Signed)
1.Medication Requested: clopidogrel (PLAVIX) 75 MG tablet  2. Pharmacy (Name, [place]): [facility] [place], [address] AT [place]  3. On Med List: yes   4. Last Visit with PCP: 02-19-2021  5. Next visit date with PCP: n/a   Agent: Please be advised that RX refills may take up to 3 business days. We ask that you follow-up with your pharmacy.

## 2021-05-26 DIAGNOSIS — E785 Hyperlipidemia, unspecified: Secondary | ICD-10-CM

## 2021-05-26 DIAGNOSIS — I10 Essential (primary) hypertension: Secondary | ICD-10-CM

## 2021-05-26 DIAGNOSIS — I63423 Cerebral infarction due to embolism of bilateral anterior cerebral arteries: Secondary | ICD-10-CM

## 2021-05-29 ENCOUNTER — Other Ambulatory Visit: Payer: Self-pay | Admitting: Internal Medicine

## 2021-05-29 NOTE — Telephone Encounter (Signed)
Called pt, LVM to discuss.  

## 2021-05-30 ENCOUNTER — Other Ambulatory Visit: Payer: Self-pay | Admitting: Internal Medicine

## 2021-05-30 DIAGNOSIS — I63423 Cerebral infarction due to embolism of bilateral anterior cerebral arteries: Secondary | ICD-10-CM

## 2021-05-30 DIAGNOSIS — I6300 Cerebral infarction due to thrombosis of unspecified precerebral artery: Secondary | ICD-10-CM

## 2021-05-30 MED ORDER — CLOPIDOGREL BISULFATE 75 MG PO TABS: 75.0000 mg | ORAL_TABLET | Freq: Every day | ORAL | 1 refills | Status: DC

## 2021-05-30 NOTE — Telephone Encounter (Signed)
Pt called back regarding refill on the Plavix. I advised the patient is Dr. Ronnald Ramp states that he no longer needed that Medication. Pt stated that he had multiple strokes after stopping the Plavix the first time.   Pt wants to discuss why Dr. Ronnald Ramp thinks he no longer needs that medication. Please call pt back at [phone].

## 2021-05-31 ENCOUNTER — Other Ambulatory Visit: Payer: Self-pay | Admitting: Internal Medicine

## 2021-06-12 ENCOUNTER — Ambulatory Visit (INDEPENDENT_AMBULATORY_CARE_PROVIDER_SITE_OTHER): Payer: Medicare Other | Admitting: Podiatry

## 2021-06-12 ENCOUNTER — Encounter: Payer: Self-pay | Admitting: Podiatry

## 2021-06-12 ENCOUNTER — Other Ambulatory Visit: Payer: Self-pay

## 2021-06-12 DIAGNOSIS — Q828 Other specified congenital malformations of skin: Secondary | ICD-10-CM | POA: Diagnosis not present

## 2021-06-12 NOTE — Progress Notes (Signed)
°  Subjective:  Patient ID: Kenneth Montoya, male    DOB: February 06, 1941,   MRN: [id number]  Chief Complaint  Patient presents with   Callouses    Recurring lesion on RT foot- ball of foot- " feels like im walking on a pebble"  Pt is using OTC corn pads for support. Requesting debridement of lesion     81 y.o. male presents for concern of lesion on the ball of his right foot. Relates it has started to become more painful and here to have it trimmed down today.  . Denies any other pedal complaints. Denies n/v/f/c.   Past Medical History:  Diagnosis Date   Arthritis    generalized   BPH (benign prostatic hyperplasia)    Cancer of skin, squamous cell    frozen    Cataract    bilateral sx   GERD (gastroesophageal reflux disease)    on meds   Hypertension    on meds   Shingles    Stroke [facility])     Objective:  Physical Exam: Vascular: DP/PT pulses 2/4 bilateral. CFT <3 seconds. Normal hair growth on digits. No edema.  Skin. No lacerations or abrasions bilateral feet. Hyperkeratotic lesions noted sub second metatarsal with cored area noted.  Musculoskeletal: MMT 5/5 bilateral lower extremities in DF, PF, Inversion and Eversion. Deceased ROM in DF of ankle joint.  Neurological: Sensation intact to light touch.   Assessment:   1. Porokeratosis      Plan:  Patient was evaluated and treated and all questions answered. -Discussed corns and calluses with patient and treatment options.  -Hyperkeratotic tissue was debrided with chisel without incident.  -Applied salycylic acid treatment to area with dressing. Advised to remove bandaging tomorrow.  -Encouraged daily moisturizing -Discussed use of pumice stone -Advised good supportive shoes and inserts -Patient to return to office as needed or sooner if condition worsens.   Lorenda Peck, DPM

## 2021-06-14 ENCOUNTER — Ambulatory Visit: Payer: Medicare Other | Admitting: Podiatrist

## 2021-06-28 ENCOUNTER — Other Ambulatory Visit: Payer: Self-pay | Admitting: Internal Medicine

## 2021-06-28 DIAGNOSIS — K219 Gastro-esophageal reflux disease without esophagitis: Secondary | ICD-10-CM

## 2021-07-05 ENCOUNTER — Other Ambulatory Visit: Payer: Self-pay | Admitting: Internal Medicine

## 2021-07-05 DIAGNOSIS — I10 Essential (primary) hypertension: Secondary | ICD-10-CM

## 2021-07-16 ENCOUNTER — Ambulatory Visit (INDEPENDENT_AMBULATORY_CARE_PROVIDER_SITE_OTHER): Payer: Medicare Other | Admitting: *Deleted

## 2021-07-16 DIAGNOSIS — I10 Essential (primary) hypertension: Secondary | ICD-10-CM

## 2021-07-16 DIAGNOSIS — I63423 Cerebral infarction due to embolism of bilateral anterior cerebral arteries: Secondary | ICD-10-CM

## 2021-07-16 DIAGNOSIS — E785 Hyperlipidemia, unspecified: Secondary | ICD-10-CM

## 2021-07-16 NOTE — Patient Instructions (Addendum)
Visit Information  Bill, thank you for taking time to talk with me today. Please don't hesitate to contact me if I can be of assistance to you before our next scheduled telephone appointment.  Below are the goals we discussed today:  Patient Self-Care Activities: Patient Kenneth Montoya will:  Take medications as prescribed  Attend all scheduled provider appointments  Call pharmacy for medication refills  Attend church or other social activities  Call provider office for new concerns or questions  Continue to follow heart healthy, low salt, low cholesterol diet Continue to stay active and social by periodically walking for exercise, going to the gym, playing golf and attending church and other social groups Continue to occasionally monitor and write down on paper his blood pressure at home Keep up the great work preventing falls!  Our next scheduled telephone follow up visit/ appointment with care management team member is scheduled on: Monday, November 19, 2021 at 11:30 am- This is a PHONE [place] appointment  If you need to cancel or re-schedule our visit, please call [phone] and our care guide team will be happy to assist you.   I look forward to hearing about your progress.   Oneta Rack, RN, BSN, [facility] [phone]: direct office  If you are experiencing a Mental Health or [place] or need someone to talk to, please  call the Suicide and Crisis Lifeline: 988 call the [place] National Suicide Prevention Lifeline: 304-010-8982 or TTY: (224)773-7112 TTY 580-792-7749) to talk to a trained counselor call 1-800-273-TALK (toll free, 24 hour hotline) go to [facility] [address] [place] [phone]) call 911   The patient verbalized understanding of instructions, educational materials, and care plan provided today and agreed to receive a mailed copy of patient  instructions, educational materials, and care plan  Managing Your Hypertension Hypertension, also called high blood pressure, is when the force of the blood pressing against the walls of the arteries is too strong. Arteries are blood vessels that carry blood from your heart throughout your body. Hypertension forces the heart to work harder to pump blood and may cause the arteries to become narrow or stiff. Understanding blood pressure readings Your personal target blood pressure may vary depending on your medical conditions, your age, and other factors. A blood pressure reading includes a higher number over a lower number. Ideally, your blood pressure should be below 120/80. You should know that: The first, or top, number is called the systolic pressure. It is a measure of the pressure in your arteries as your heart beats. The second, or bottom number, is called the diastolic pressure. It is a measure of the pressure in your arteries as the heart relaxes. Blood pressure is classified into four stages. Based on your blood pressure reading, your health care provider may use the following stages to determine what type of treatment you need, if any. Systolic pressure and diastolic pressure are measured in a unit called mmHg. Normal Systolic pressure: below 267. Diastolic pressure: below 80. Elevated Systolic pressure: 124-580. Diastolic pressure: below 80. Hypertension stage 1 Systolic pressure: 998-338. Diastolic pressure: 25-05. Hypertension stage 2 Systolic pressure: 397 or above. Diastolic pressure: 90 or above. How can this condition affect me? Managing your hypertension is an important responsibility. Over time, hypertension can damage the arteries and decrease blood flow to important parts of the body, including the brain, heart, and kidneys. Having untreated or uncontrolled hypertension can  lead to: A heart attack. A stroke. A weakened blood vessel (aneurysm). Heart failure. Kidney  damage. Eye damage. Metabolic syndrome. Memory and concentration problems. Vascular dementia. What actions can I take to manage this condition? Hypertension can be managed by making lifestyle changes and possibly by taking medicines. Your health care provider will help you make a plan to bring your blood pressure within a normal range. Nutrition  Eat a diet that is high in fiber and potassium, and low in salt (sodium), added sugar, and fat. An example eating plan is called the Dietary Approaches to Stop Hypertension (DASH) diet. To eat this way: Eat plenty of fresh fruits and vegetables. Try to fill one-half of your plate at each meal with fruits and vegetables. Eat whole grains, such as whole-wheat pasta, brown rice, or whole-grain bread. Fill about one-fourth of your plate with whole grains. Eat low-fat dairy products. Avoid fatty cuts of meat, processed or cured meats, and poultry with skin. Fill about one-fourth of your plate with lean proteins such as fish, chicken without skin, beans, eggs, and tofu. Avoid pre-made and processed foods. These tend to be higher in sodium, added sugar, and fat. Reduce your daily sodium intake. Most people with hypertension should eat less than 1,500 mg of sodium a day. Lifestyle  Work with your health care provider to maintain a healthy body weight or to lose weight. Ask what an ideal weight is for you. Get at least 30 minutes of exercise that causes your heart to beat faster (aerobic exercise) most days of the week. Activities may include walking, swimming, or biking. Include exercise to strengthen your muscles (resistance exercise), such as weight lifting, as part of your weekly exercise routine. Try to do these types of exercises for 30 minutes at least 3 days a week. Do not use any products that contain nicotine or tobacco, such as cigarettes, e-cigarettes, and chewing tobacco. If you need help quitting, ask your health care provider. Control any  long-term (chronic) conditions you have, such as high cholesterol or diabetes. Identify your sources of stress and find ways to manage stress. This may include meditation, deep breathing, or making time for fun activities. Alcohol use Do not drink alcohol if: Your health care provider tells you not to drink. You are pregnant, may be pregnant, or are planning to become pregnant. If you drink alcohol: Limit how much you use to: 0-1 drink a day for women. 0-2 drinks a day for men. Be aware of how much alcohol is in your drink. In the [place] one drink equals one 12 oz bottle of beer (355 mL), one 5 oz glass of wine (148 mL), or one 1 oz glass of hard liquor (44 mL). Medicines Your health care provider may prescribe medicine if lifestyle changes are not enough to get your blood pressure under control and if: Your systolic blood pressure is 130 or higher. Your diastolic blood pressure is 80 or higher. Take medicines only as told by your health care provider. Follow the directions carefully. Blood pressure medicines must be taken as told by your health care provider. The medicine does not work as well when you skip doses. Skipping doses also puts you at risk for problems. Monitoring Before you monitor your blood pressure: Do not smoke, drink caffeinated beverages, or exercise within 30 minutes before taking a measurement. Use the bathroom and empty your bladder (urinate). Sit quietly for at least 5 minutes before taking measurements. Monitor your blood pressure at home as told by  your health care provider. To do this: Sit with your back straight and supported. Place your feet flat on the floor. Do not cross your legs. Support your arm on a flat surface, such as a table. Make sure your upper arm is at heart level. Each time you measure, take two or three readings one minute apart and record the results. You may also need to have your blood pressure checked regularly by your health care  provider. General information Talk with your health care provider about your diet, exercise habits, and other lifestyle factors that may be contributing to hypertension. Review all the medicines you take with your health care provider because there may be side effects or interactions. Keep all visits as told by your health care provider. Your health care provider can help you create and adjust your plan for managing your high blood pressure. Where to find more information National Heart, Lung, and Blood Institute: https://wilson-eaton.com/ American Heart Association: www.heart.org Contact a health care provider if: You think you are having a reaction to medicines you have taken. You have repeated (recurrent) headaches. You feel dizzy. You have swelling in your ankles. You have trouble with your vision. Get help right away if: You develop a severe headache or confusion. You have unusual weakness or numbness, or you feel faint. You have severe pain in your chest or abdomen. You vomit repeatedly. You have trouble breathing. These symptoms may represent a serious problem that is an emergency. Do not wait to see if the symptoms will go away. Get medical help right away. Call your local emergency services (911 in the [place] Do not drive yourself to the hospital. Summary Hypertension is when the force of blood pumping through your arteries is too strong. If this condition is not controlled, it may put you at risk for serious complications. Your personal target blood pressure may vary depending on your medical conditions, your age, and other factors. For most people, a normal blood pressure is less than 120/80. Hypertension is managed by lifestyle changes, medicines, or both. Lifestyle changes to help manage hypertension include losing weight, eating a healthy, low-sodium diet, exercising more, stopping smoking, and limiting alcohol. This information is not intended to replace advice given to you by your  health care provider. Make sure you discuss any questions you have with your health care provider. Document Revised: 05/31/2019 Document Reviewed: 04/13/2019 Elsevier Patient Education  2022 [employer].

## 2021-07-16 NOTE — Chronic Care Management (AMB) (Signed)
Chronic Care Management   CCM RN Visit Note  07/16/2021 Name: Kenneth Montoya MRN: [id number] DOB: 1940-12-15  Subjective: Kenneth Montoya is a 81 y.o. year old male who is a primary care patient of Janith Lima, MD. The care management team was consulted for assistance with disease management and care coordination needs.    Engaged with patient by telephone for follow up visit in response to provider referral for case management and/or care coordination services.   Consent to Services:  The patient was given information about Chronic Care Management services, agreed to services, and gave verbal consent prior to initiation of services.  Please see initial visit note for detailed documentation.  Patient agreed to services and verbal consent obtained.   Assessment: Review of patient past medical history, allergies, medications, health status, including review of consultants reports, laboratory and other test data, was performed as part of comprehensive evaluation and provision of chronic care management services.   SDOH (Social Determinants of Health) assessments and interventions performed:  SDOH Interventions    Flowsheet Row Most Recent Value  SDOH Interventions   Food Insecurity Interventions Intervention Not Indicated  [Denies food insecurity]  Housing Interventions Intervention Not Indicated  Transportation Interventions Intervention Not Indicated  [continues driving self]       CCM Care Plan No Known Allergies  Outpatient Encounter Medications as of 07/16/2021  Medication Sig   acetaminophen (TYLENOL) 500 MG tablet Take 1,000 mg by mouth in the morning and at bedtime.   amLODipine (NORVASC) 10 MG tablet TAKE 1 TABLET BY MOUTH AT  BEDTIME   ascorbic acid (VITAMIN C) 1000 MG tablet Take 1,000 mg by mouth at bedtime.   atorvastatin (LIPITOR) 10 MG tablet TAKE 1 TABLET(10 MG) BY MOUTH DAILY   clopidogrel (PLAVIX) 75 MG tablet Take 1 tablet (75 mg total) by mouth daily.    fluticasone (FLONASE) 50 MCG/ACT nasal spray PLACE 1 SPRAY IN BOTH  NOSTRILS DAILY   losartan (COZAAR) 100 MG tablet TAKE 1 TABLET BY MOUTH  DAILY   naproxen sodium (ALEVE) 220 MG tablet Take 220 mg by mouth daily as needed.   olopatadine (PATANOL) 0.1 % ophthalmic solution Place 1 drop into both eyes at bedtime.   omeprazole (PRILOSEC) 40 MG capsule TAKE 1 CAPSULE BY MOUTH  DAILY   PREVIDENT 5000 PLUS 1.1 % CREA dental cream SMARTSIG:Application By Mouth   psyllium (METAMUCIL) 58.6 % powder Take 1 packet by mouth at bedtime.   tamsulosin (FLOMAX) 0.4 MG CAPS capsule Take 0.4 mg by mouth at bedtime.   aspirin EC 81 MG tablet Take 81 mg by mouth daily. Swallow whole. (Patient not taking: Reported on 07/16/2021)   No facility-administered encounter medications on file as of 07/16/2021.   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/04/2021   SIADH (syndrome of inappropriate ADH production) ([place]) 01/10/2021   Hyperlipidemia with target LDL less than 100 12/17/2020   Stroke ([place]) 12/09/2020   Cerebral infarction ([place]) 12/09/2020   Psychophysiological insomnia 11/21/2020   GAD (generalized anxiety disorder) 11/21/2020   BPH (benign prostatic hyperplasia)    Essential hypertension 11/08/2019   Gastroesophageal reflux disease without esophagitis 11/08/2019   Herpes simplex 11/08/2019   Primary erectile dysfunction 11/08/2019   Senile purpura ([place]) 05/13/2019   Hardening of the aorta (main artery of the heart) ([place]) 01/19/2019   Chronic allergic rhinitis due to pollen 03/24/2017   LPRD (laryngopharyngeal reflux disease) 03/24/2017   Bladder outflow obstruction  03/04/2016   Conditions to be addressed/monitored:  HTN and HLD  Care Plan : RN Care Manager Plan of Care  Updates made by Knox Royalty, RN since 07/16/2021 12:00 AM     Problem: Chronic Disease Management Needs   Priority: Medium     Long-Range Goal: Ongoing adherence to established plan of  care for long term chronic disease management   Start Date: 02/20/2021  Expected End Date: 02/20/2022  Priority: Medium  Note:   Current Barriers:  Chronic Disease Management support and education needs related to HTN, HLD, and CVA Two recent hospitalizations for ischemic CVA: July 16-17, 2022 and September 11-12, 2022 03/23/21: patient reports wife has been recently diagnosed with Stage 4 ovarian cancer, hospitalized for one week, and he is now providing care for spouse; verbalizes plans to take spouse to [facility] in [place] for treatment: patient remains anxious and under considerable stress, especially with this new development;  update 05/14/21: reports wife "doing well;" attending chemo/ oncology provider office visit through [facility]- reports happy with wife's care; denies need for caregiver support Updated 07/16/21: repots wife doing well; will complete her chemotherapy in April  RNCM Clinical Goal(s):  Patient will demonstrate ongoing health management independence with HTN, HLD, and CVA  through collaboration with RN Care manager, provider, and care team.   Interventions: 1:1 collaboration with primary care provider regarding development and update of comprehensive plan of care as evidenced by provider attestation and co-signature Inter-disciplinary care team collaboration (see longitudinal plan of care) Evaluation of current treatment plan related to  self management and patient's adherence to plan as established by provider 07/16/21: SDOH updated- no new concerns/ unmet needs identified 07/16/21: Falls assessment updated: reports no new/ recent falls- does not use assistive devices; feels "steady" when walking; denies falls x > 12 months 07/16/21: Pain assessment updated: denies acute/ chronic pain 07/16/21: medication review completed: patient continues to self-manage medications and endorses adherence to prescribed medications; medication list in EHR updated according to patient  report today  Hyperlipidemia:  (Status:07/16/21: Goal on Track (progressing): YES.) Long Term Goal Lab Results  Component Value Date   CHOL 120 02/05/2021   HDL 64 02/05/2021   [place] 48 02/05/2021   TRIG 38 02/05/2021   CHOLHDL 1.9 02/05/2021  Counseled on importance of regular laboratory monitoring as prescribed; Reviewed role and benefits of statin for ASCVD risk reduction; Reviewed importance of limiting foods high in cholesterol; Reviewed exercise goals and target of 150 minutes per week; Confirmed patient continues taking medications independently and continues to deny medication concerns/ verbalizes very good understanding of overall healthcare goals  Encouraged continuation of heart healthy, low sodium, low cholesterol diet  Hypertension: (Status: 07/16/21: Goal on Track (progressing): YES.) Long Term Goal Last practice recorded BP readings:  BP Readings from Last 3 Encounters:  02/27/21 138/70  02/21/21 (!) 158/72  02/19/21 126/76  Most recent eGFR/CrCl: No results found for: EGFR  No components found for: CRCL  Evaluation of current treatment plan related to hypertension self management and patient's adherence to plan as established by provider;   Reviewed prescribed diet heart healthy, low sodium, low cholesterol Counseled on the importance of exercise goals with target of 150 minutes per week Discussed plans with patient for ongoing care management follow up and provided patient with direct contact information for care management team; Reports taking medications as prescribed, denies concerns/ issues around medications: continues to independently self-administer/ manage Continues taking Plavix-- is not taking baby ASA Confirmed occasionally  monitoring blood pressures at home currently- reviewed recent blood pressures at home: he reports "all are fine;" reports most recent blood pressure as "120/80"- encouraged ongoing monitoring/ surveillance of blood pressures at  home Reviewed upcoming provider office visits: August 14, 2021: neurology provider; November 14, 2021: PCP Reviewed recent weights at home: he monitors occasionally and reports most recent weight at home of "194 lbs;" reinforced previously provided strategies for dietary adherence, including portion control and minimal salt intake Confirmed patient has remained active and has continued walking for exercise: reports going to gym, walks > 2 miles "a couple of times each week;" states able to walk a mile in "25 minutes"-- positive reinforcement provided with encouragement to continue efforts to remain active/ benefits of activity in setting of HTN/ HLD/ history CVA reinforced  Patient Goals/Self-Care Activities: As evidenced by review of EHR, collaboration with care team, and patient reporting during CCM RN CM outreach,  Patient [place] will:  Take medications as prescribed  Attend all scheduled provider appointments  Call pharmacy for medication refills  Attend church or other social activities  Call provider office for new concerns or questions  Continue to follow heart healthy, low salt, low cholesterol diet Continue to stay active and social by periodically walking for exercise, going to the gym, playing golf and attending church and other social groups Continue to occasionally monitor and write down on paper his blood pressure at home Keep up the great work preventing falls!      Plan: Telephone follow up appointment with care management team member scheduled for:  Monday, November 19, 2021 at 11:30 am The patient has been provided with contact information for the care management team and has been advised to call with any health related questions or concerns  Oneta Rack, RN, BSN, [place] [phone]: direct office

## 2021-07-24 DIAGNOSIS — E785 Hyperlipidemia, unspecified: Secondary | ICD-10-CM | POA: Diagnosis not present

## 2021-07-24 DIAGNOSIS — I10 Essential (primary) hypertension: Secondary | ICD-10-CM | POA: Diagnosis not present

## 2021-07-27 NOTE — Telephone Encounter (Signed)
NOTE NOT NEEDED ?

## 2021-07-30 ENCOUNTER — Other Ambulatory Visit: Payer: Self-pay | Admitting: Internal Medicine

## 2021-08-02 ENCOUNTER — Other Ambulatory Visit: Payer: Self-pay | Admitting: Internal Medicine

## 2021-08-02 DIAGNOSIS — I10 Essential (primary) hypertension: Secondary | ICD-10-CM

## 2021-08-13 NOTE — Progress Notes (Deleted)
? ?NEUROLOGY FOLLOW UP OFFICE NOTE ? ?Kenneth Montoya ?[id number] ? ?Assessment/Plan:  ? ?Right occipital lobe infarct, likely embolic of unknown source - I don't appreciate visual field defect on gross exam. ?Hypertension ?  ?Secondary stroke prevention: ?ASA '81mg'$  daily ?Restart atorvastatin at lower dose - '10mg'$  daily.  LDL goal less than 70.  Repeat lipid panel in 3 months. ?Hgb A1c goal less than 7 ?Normotensive blood pressure ?Agreeable to 30 day cardiac event monitor. *** ?Mediterranean diet ?Follow up 6 months. ?  ?  ?  ?Subjective:  ?Kenneth Montoya is an 81 year old right-handed male with HTN, OA, SIADH and bilateral knee replacement who follows up for stroke. ? ?UPDATE: ?Current medications:  *** ? ?2D echocardiogram on 02/05/2021 showed LVEF 60-65% with no cardiac source of emboli.  Cardiac event monitor was ordered but *** ?  ?HISTORY: ?He was admitted to [facility] on 12/09/2020 for stroke. He presented with transient left sided vision loss.  MRI of brain showed acute right medial occipital lobe infarct as well as extensive chronic small vessel ischemic changes including remote cortical infarct of the posterior right middle frontal gyrus.  MRA of head and neck showed severe distal left P2 and right P3 stenoses as well as 40-50% proximal right ICA stenosis.  LDL was 86 and Hgb A1c was 5.8.  He was found to be positive for COVID.  Echocardiogram was never performed as patient desired to be discharged.  He was discharged on dual antiplatelet therapy for 21 days followed by ASA alone as well as started on atorvastatin '40mg'$  daily.  Repeat LDL about a week later was 50.  However, he subsequently discontinued the atorvastatin because he felt dizzy and weak.  Outpatient 30 day cardiac event monitor was ordered but he has declined.  Still has a blind spot.  He has followed up with the eye doctor. ? ?PAST MEDICAL HISTORY: ?Past Medical History:  ?Diagnosis Date  ? Arthritis   ? generalized  ? BPH (benign  prostatic hyperplasia)   ? Cancer of skin, squamous cell   ? frozen   ? Cataract   ? bilateral sx  ? GERD (gastroesophageal reflux disease)   ? on meds  ? Hypertension   ? on meds  ? Shingles   ? Stroke [facility])   ? ? ?MEDICATIONS: ?Current Outpatient Medications on File Prior to Visit  ?Medication Sig Dispense Refill  ? acetaminophen (TYLENOL) 500 MG tablet Take 1,000 mg by mouth in the morning and at bedtime.    ? amLODipine (NORVASC) 10 MG tablet TAKE 1 TABLET BY MOUTH AT  BEDTIME 90 tablet 3  ? ascorbic acid (VITAMIN C) 1000 MG tablet Take 1,000 mg by mouth at bedtime.    ? aspirin EC 81 MG tablet Take 81 mg by mouth daily. Swallow whole. (Patient not taking: Reported on 07/16/2021)    ? atorvastatin (LIPITOR) 10 MG tablet TAKE 1 TABLET(10 MG) BY MOUTH DAILY 90 tablet 1  ? clopidogrel (PLAVIX) 75 MG tablet Take 1 tablet (75 mg total) by mouth daily. 90 tablet 1  ? fluticasone (FLONASE) 50 MCG/ACT nasal spray PLACE 1 SPRAY IN BOTH  NOSTRILS DAILY 32 g 1  ? losartan (COZAAR) 100 MG tablet TAKE 1 TABLET BY MOUTH  DAILY 90 tablet 1  ? naproxen sodium (ALEVE) 220 MG tablet Take 220 mg by mouth daily as needed.    ? olopatadine (PATANOL) 0.1 % ophthalmic solution Place 1 drop into both eyes at bedtime.    ?  omeprazole (PRILOSEC) 40 MG capsule TAKE 1 CAPSULE BY MOUTH  DAILY 90 capsule 3  ? PREVIDENT 5000 PLUS 1.1 % CREA dental cream SMARTSIG:Application By Mouth    ? psyllium (METAMUCIL) 58.6 % powder Take 1 packet by mouth at bedtime.    ? tamsulosin (FLOMAX) 0.4 MG CAPS capsule Take 0.4 mg by mouth at bedtime.    ? ?No current facility-administered medications on file prior to visit.  ? ? ?ALLERGIES: ?No Known Allergies ? ?FAMILY HISTORY: ?Family History  ?Problem Relation Age of Onset  ? Colon polyps Mother 73  ? Stroke Maternal Grandfather   ? Colon cancer Neg Hx   ? Esophageal cancer Neg Hx   ? Rectal cancer Neg Hx   ? Stomach cancer Neg Hx   ? ? ?  ?Objective:  ?*** ?General: No acute distress.  Patient appears  well-groomed.   ?Head:  Normocephalic/atraumatic ?Eyes:  Fundi examined but not visualized ?Neck: supple, no paraspinal tenderness, full range of motion ?Heart:  Regular rate and rhythm ?Lungs:  Clear to auscultation bilaterally ?Back: No paraspinal tenderness ?Neurological Exam: alert and oriented to person, place, and time.  Speech fluent and not dysarthric, language intact.  CN II-XII intact. Bulk and tone normal, muscle strength 5/5 throughout.  Sensation to light touch intact.  Deep tendon reflexes 2+ throughout, toes downgoing.  Finger to nose testing intact.  Gait normal, Romberg negative. ? ? ?Metta Clines, DO ? ?CC: Scarlette Calico, MD ? ? ? ? ? ? ?

## 2021-08-14 ENCOUNTER — Ambulatory Visit: Payer: Medicare Other | Admitting: Neurology

## 2021-09-18 ENCOUNTER — Ambulatory Visit: Payer: Medicare Other | Admitting: Podiatry

## 2021-09-19 ENCOUNTER — Ambulatory Visit (INDEPENDENT_AMBULATORY_CARE_PROVIDER_SITE_OTHER): Payer: Self-pay | Admitting: Podiatry

## 2021-09-19 DIAGNOSIS — Z91199 Patient's noncompliance with other medical treatment and regimen due to unspecified reason: Secondary | ICD-10-CM

## 2021-09-19 NOTE — Progress Notes (Signed)
No show

## 2021-09-20 ENCOUNTER — Ambulatory Visit: Payer: Medicare Other | Admitting: Podiatry

## 2021-09-21 ENCOUNTER — Ambulatory Visit (INDEPENDENT_AMBULATORY_CARE_PROVIDER_SITE_OTHER): Payer: Medicare Other | Admitting: Podiatry

## 2021-09-21 DIAGNOSIS — M7751 Other enthesopathy of right foot: Secondary | ICD-10-CM

## 2021-09-28 ENCOUNTER — Encounter: Payer: Self-pay | Admitting: Podiatry

## 2021-09-28 NOTE — Progress Notes (Signed)
?Subjective:  ?Patient ID: Kenneth Montoya, male    DOB: 07-Mar-1941,  MRN: [id number] ? ?Chief Complaint  ?Patient presents with  ? Callouses  ? ? ?81 y.o. male presents with the above complaint.  Patient presents with complaint of right submetatarsal 2 porokeratotic lesion.  She states is painful to touch has progressive gotten worse.  He would like to have it debrided down.  He is having underlying capsulitis which is causing him some pain as well.  He denies any other acute complaints. ? ? ?Review of Systems: Negative except as noted in the HPI. Denies N/V/F/Ch. ? ?Past Medical History:  ?Diagnosis Date  ? Arthritis   ? generalized  ? BPH (benign prostatic hyperplasia)   ? Cancer of skin, squamous cell   ? frozen   ? Cataract   ? bilateral sx  ? GERD (gastroesophageal reflux disease)   ? on meds  ? Hypertension   ? on meds  ? Shingles   ? Stroke [facility])   ? ? ?Current Outpatient Medications:  ?  acetaminophen (TYLENOL) 500 MG tablet, Take 1,000 mg by mouth in the morning and at bedtime., Disp: , Rfl:  ?  amLODipine (NORVASC) 10 MG tablet, TAKE 1 TABLET BY MOUTH AT  BEDTIME, Disp: 90 tablet, Rfl: 3 ?  ascorbic acid (VITAMIN C) 1000 MG tablet, Take 1,000 mg by mouth at bedtime., Disp: , Rfl:  ?  aspirin EC 81 MG tablet, Take 81 mg by mouth daily. Swallow whole. (Patient not taking: Reported on 07/16/2021), Disp: , Rfl:  ?  atorvastatin (LIPITOR) 10 MG tablet, TAKE 1 TABLET(10 MG) BY MOUTH DAILY, Disp: 90 tablet, Rfl: 1 ?  clopidogrel (PLAVIX) 75 MG tablet, Take 1 tablet (75 mg total) by mouth daily., Disp: 90 tablet, Rfl: 1 ?  fluticasone (FLONASE) 50 MCG/ACT nasal spray, PLACE 1 SPRAY IN BOTH  NOSTRILS DAILY, Disp: 32 g, Rfl: 1 ?  losartan (COZAAR) 100 MG tablet, TAKE 1 TABLET BY MOUTH  DAILY, Disp: 90 tablet, Rfl: 1 ?  naproxen sodium (ALEVE) 220 MG tablet, Take 220 mg by mouth daily as needed., Disp: , Rfl:  ?  olopatadine (PATANOL) 0.1 % ophthalmic solution, Place 1 drop into both eyes at bedtime., Disp: , Rfl:  ?   omeprazole (PRILOSEC) 40 MG capsule, TAKE 1 CAPSULE BY MOUTH  DAILY, Disp: 90 capsule, Rfl: 3 ?  PREVIDENT 5000 PLUS 1.1 % CREA dental cream, SMARTSIG:Application By Mouth, Disp: , Rfl:  ?  psyllium (METAMUCIL) 58.6 % powder, Take 1 packet by mouth at bedtime., Disp: , Rfl:  ?  tamsulosin (FLOMAX) 0.4 MG CAPS capsule, Take 0.4 mg by mouth at bedtime., Disp: , Rfl:  ? ?Social History  ? ?Tobacco Use  ?Smoking Status Never  ?Smokeless Tobacco Never  ? ? ?No Known Allergies ?Objective:  ?There were no vitals filed for this visit. ?There is no height or weight on file to calculate BMI. ?Constitutional Well developed. ?Well nourished.  ?Vascular Dorsalis pedis pulses palpable bilaterally. ?Posterior tibial pulses palpable bilaterally. ?Capillary refill normal to all digits.  ?No cyanosis or clubbing noted. ?Pedal hair growth normal.  ?Neurologic Normal speech. ?Oriented to person, place, and time. ?Epicritic sensation to light touch grossly present bilaterally.  ?Dermatologic Hyperkeratotic lesion with central nucleated core noted to the right submetatarsal 2.  Pain with range of motion of the second metatarsophalangeal joint consistent with capsulitis.  No extensor or flexor tendinitis noted  ?Orthopedic: Normal joint ROM without pain or crepitus bilaterally. ?No visible  deformities. ?No bony tenderness.  ? ?Radiographs: None ?Assessment:  ? ?1. Capsulitis of metatarsophalangeal (MTP) joint of right foot   ? ?Plan:  ?Patient was evaluated and treated and all questions answered. ? ?Right second metatarsophalangeal joint capsulitis ?-All questions and concerns were discussed with the patient extensive detail.  Given the pain that he is having he will benefit from a steroid injection to help decrease the capsulitis followed by debridement of the lesion.  I discussed with patient he states understand would like to proceed with that. ?-A steroid injection was performed at left second metatarsophalangeal joint capsulitis  using 1% plain Lidocaine and 10 mg of Kenalog. This was well tolerated. ? ? ?No follow-ups on file. ?

## 2021-10-09 ENCOUNTER — Encounter: Payer: Self-pay | Admitting: Internal Medicine

## 2021-10-09 ENCOUNTER — Ambulatory Visit (INDEPENDENT_AMBULATORY_CARE_PROVIDER_SITE_OTHER): Payer: Medicare Other | Admitting: Internal Medicine

## 2021-10-09 VITALS — BP 138/70 | HR 72 | Temp 98.2°F | Ht 69.0 in | Wt 198.0 lb

## 2021-10-09 DIAGNOSIS — I10 Essential (primary) hypertension: Secondary | ICD-10-CM

## 2021-10-09 DIAGNOSIS — L233 Allergic contact dermatitis due to drugs in contact with skin: Secondary | ICD-10-CM | POA: Diagnosis not present

## 2021-10-09 DIAGNOSIS — N401 Enlarged prostate with lower urinary tract symptoms: Secondary | ICD-10-CM | POA: Diagnosis not present

## 2021-10-09 DIAGNOSIS — I6300 Cerebral infarction due to thrombosis of unspecified precerebral artery: Secondary | ICD-10-CM | POA: Diagnosis not present

## 2021-10-09 DIAGNOSIS — E222 Syndrome of inappropriate secretion of antidiuretic hormone: Secondary | ICD-10-CM

## 2021-10-09 DIAGNOSIS — R3912 Poor urinary stream: Secondary | ICD-10-CM

## 2021-10-09 LAB — BASIC METABOLIC PANEL
BUN: 24 mg/dL — ABNORMAL HIGH (ref 6–23)
CO2: 27 mEq/L (ref 19–32)
Calcium: 9 mg/dL (ref 8.4–10.5)
Chloride: 93 mEq/L — ABNORMAL LOW (ref 96–112)
Creatinine, Ser: 1.1 mg/dL (ref 0.40–1.50)
GFR: 62.69 mL/min (ref >60.00)
Glucose, Bld: 96 mg/dL (ref 70–99)
Potassium: 4.8 mEq/L (ref 3.5–5.1)
Sodium: 128 mEq/L — ABNORMAL LOW (ref 135–145)

## 2021-10-09 LAB — CBC WITH DIFFERENTIAL/PLATELET
Basophils Absolute: 0 10*3/uL (ref 0.0–0.1)
Basophils Relative: 0.4 % (ref 0.0–3.0)
Eosinophils Absolute: 0.4 10*3/uL (ref 0.0–0.7)
Eosinophils Relative: 3.9 % (ref 0.0–5.0)
HCT: 39 % (ref 39.0–52.0)
Hemoglobin: 13.2 g/dL (ref 13.0–17.0)
Lymphocytes Relative: 10.9 % — ABNORMAL LOW (ref 12.0–46.0)
Lymphs Abs: 1.2 10*3/uL (ref 0.7–4.0)
MCHC: 33.9 g/dL (ref 30.0–36.0)
MCV: 93.4 fl (ref 78.0–100.0)
Monocytes Absolute: 0.8 10*3/uL (ref 0.1–1.0)
Monocytes Relative: 7.5 % (ref 3.0–12.0)
Neutro Abs: 8.3 10*3/uL — ABNORMAL HIGH (ref 1.4–7.7)
Neutrophils Relative %: 77.3 % — ABNORMAL HIGH (ref 43.0–77.0)
Platelets: 236 10*3/uL (ref 150.0–400.0)
RBC: 4.18 Mil/uL — ABNORMAL LOW (ref 4.22–5.81)
RDW: 12.2 % (ref 11.5–15.5)
WBC: 10.8 10*3/uL — ABNORMAL HIGH (ref 4.0–10.5)

## 2021-10-09 MED ORDER — HYDROXYZINE HCL 10 MG PO TABS: 10.0000 mg | ORAL_TABLET | Freq: Three times a day (TID) | ORAL | 0 refills | Status: AC | PRN

## 2021-10-09 MED ORDER — METHYLPREDNISOLONE ACETATE 80 MG/ML IJ SUSP
120.0000 mg | Freq: Once | INTRAMUSCULAR | Status: AC
  Administered 2021-10-09: 120 mg via INTRAMUSCULAR

## 2021-10-09 MED ORDER — FLUOCINONIDE EMULSIFIED BASE 0.05 % EX CREA: 1.0000 "application " | TOPICAL_CREAM | Freq: Two times a day (BID) | CUTANEOUS | 0 refills | Status: DC

## 2021-10-09 NOTE — Patient Instructions (Signed)
Contact Dermatitis Dermatitis is redness, soreness, and swelling (inflammation) of the skin. Contact dermatitis is a reaction to certain substances that touch the skin. Many different substances can cause contact dermatitis. There are two types of contact dermatitis: Irritant contact dermatitis. This type is caused by something that irritates your skin, such as having dry hands from washing them too often with soap. This type does not require previous exposure to the substance for a reaction to occur. This is the most common type. Allergic contact dermatitis. This type is caused by a substance that you are allergic to, such as poison ivy. This type occurs when you have been exposed to the substance (allergen) and develop a sensitivity to it. Dermatitis may develop soon after your first exposure to the allergen, or it may not develop until the next time you are exposed and every time thereafter. What are the causes? Irritant contact dermatitis is most commonly caused by exposure to: Makeup. Soaps. Detergents. Bleaches. Acids. Metal salts, such as nickel. Allergic contact dermatitis is most commonly caused by exposure to: Poisonous plants. Chemicals. Jewelry. Latex. Medicines. Preservatives in products, such as clothing. What increases the risk? You are more likely to develop this condition if you have: A job that exposes you to irritants or allergens. Certain medical conditions, such as asthma or eczema. What are the signs or symptoms? Symptoms of this condition may occur on your body anywhere the irritant has touched you or is touched by you. Symptoms include: Dryness or flaking. Redness. Cracks. Itching. Pain or a burning feeling. Blisters. Drainage of small amounts of blood or clear fluid from skin cracks. With allergic contact dermatitis, there may also be swelling in areas such as the eyelids, mouth, or genitals. How is this diagnosed? This condition is diagnosed with a medical  history and physical exam. A patch skin test may be performed to help determine the cause. If the condition is related to your job, you may need to see an occupational medicine specialist. How is this treated? This condition is treated by checking for the cause of the reaction and protecting your skin from further contact. Treatment may also include: Steroid creams or ointments. Oral steroid medicines may be needed in more severe cases. Antibiotic medicines or antibacterial ointments, if a skin infection is present. Antihistamine lotion or an antihistamine taken by mouth to ease itching. A bandage (dressing). Follow these instructions at home: Skin care Moisturize your skin as needed. Apply cool compresses to the affected areas. Try applying baking soda paste to your skin. Stir water into baking soda until it reaches a paste-like consistency. Do not scratch your skin, and avoid friction to the affected area. Avoid the use of soaps, perfumes, and dyes. Medicines Take or apply over-the-counter and prescription medicines only as told by your health care provider. If you were prescribed an antibiotic medicine, take or apply the antibiotic as told by your health care provider. Do not stop using the antibiotic even if your condition improves. Bathing Try taking a bath with: Epsom salts. Follow the instructions on the packaging. You can get these at your local pharmacy or grocery store. Baking soda. Pour a small amount into the bath as directed by your health care provider. Colloidal oatmeal. Follow the instructions on the packaging. You can get this at your local pharmacy or grocery store. Bathe less frequently, such as every other day. Bathe in lukewarm water. Avoid using hot water. Bandage care If you were given a bandage (dressing), change it as told   by your health care provider. Wash your hands with soap and water before and after you change your dressing. If soap and water are not  available, use hand sanitizer. General instructions Avoid the substance that caused your reaction. If you do not know what caused it, keep a journal to try to track what caused it. Write down: What you eat. What cosmetic products you use. What you drink. What you wear in the affected area. This includes jewelry. Check the affected areas every day for signs of infection. Check for: More redness, swelling, or pain. More fluid or blood. Warmth. Pus or a bad smell. Keep all follow-up visits as told by your health care provider. This is important. Contact a health care provider if: Your condition does not improve with treatment. Your condition gets worse. You have signs of infection such as swelling, tenderness, redness, soreness, or warmth in the affected area. You have a fever. You have new symptoms. Get help right away if: You have a severe headache, neck pain, or neck stiffness. You vomit. You feel very sleepy. You notice red streaks coming from the affected area. Your bone or joint underneath the affected area becomes painful after the skin has healed. The affected area turns darker. You have difficulty breathing. Summary Dermatitis is redness, soreness, and swelling (inflammation) of the skin. Contact dermatitis is a reaction to certain substances that touch the skin. Symptoms of this condition may occur on your body anywhere the irritant has touched you or is touched by you. This condition is treated by figuring out what caused the reaction and protecting your skin from further contact. Treatment may also include medicines and skin care. Avoid the substance that caused your reaction. If you do not know what caused it, keep a journal to try to track what caused it. Contact a health care provider if your condition gets worse or you have signs of infection such as swelling, tenderness, redness, soreness, or warmth in the affected area. This information is not intended to replace  advice given to you by your health care provider. Make sure you discuss any questions you have with your health care provider. Document Revised: 02/26/2021 Document Reviewed: 02/26/2021 Elsevier Patient Education  2023 [employer] Inc.  

## 2021-10-09 NOTE — Progress Notes (Signed)
? ? ?Subjective:  ?Patient ID: Kenneth Montoya, male    DOB: May 27, 1941  Age: 81 y.o. MRN: [id number] ? ?CC: Rash ? ? ?HPI ?Kenneth Montoya presents for f/up - ? ?He complains of a 5 day hx or pruritic rash over his right hip after using aspercreme with lidocaine. ? ?Outpatient Medications Prior to Visit  ?Medication Sig Dispense Refill  ? acetaminophen (TYLENOL) 500 MG tablet Take 1,000 mg by mouth in the morning and at bedtime.    ? amLODipine (NORVASC) 10 MG tablet TAKE 1 TABLET BY MOUTH AT  BEDTIME 90 tablet 3  ? ascorbic acid (VITAMIN C) 1000 MG tablet Take 1,000 mg by mouth at bedtime.    ? aspirin EC 81 MG tablet Take 81 mg by mouth daily. Swallow whole.    ? atorvastatin (LIPITOR) 10 MG tablet TAKE 1 TABLET(10 MG) BY MOUTH DAILY 90 tablet 1  ? clopidogrel (PLAVIX) 75 MG tablet Take 1 tablet (75 mg total) by mouth daily. 90 tablet 1  ? fluticasone (FLONASE) 50 MCG/ACT nasal spray PLACE 1 SPRAY IN BOTH  NOSTRILS DAILY 32 g 1  ? losartan (COZAAR) 100 MG tablet TAKE 1 TABLET BY MOUTH  DAILY 90 tablet 1  ? naproxen sodium (ALEVE) 220 MG tablet Take 220 mg by mouth daily as needed.    ? olopatadine (PATANOL) 0.1 % ophthalmic solution Place 1 drop into both eyes at bedtime.    ? omeprazole (PRILOSEC) 40 MG capsule TAKE 1 CAPSULE BY MOUTH  DAILY 90 capsule 3  ? PREVIDENT 5000 PLUS 1.1 % CREA dental cream SMARTSIG:Application By Mouth    ? psyllium (METAMUCIL) 58.6 % powder Take 1 packet by mouth at bedtime.    ? tamsulosin (FLOMAX) 0.4 MG CAPS capsule Take 0.4 mg by mouth at bedtime.    ? ?No facility-administered medications prior to visit.  ? ? ?ROS ?Review of Systems  ?Constitutional: Negative.  Negative for diaphoresis and fatigue.  ?HENT: Negative.    ?Eyes: Negative.   ?Respiratory:  Negative for cough, chest tightness, shortness of breath and wheezing.   ?Cardiovascular:  Negative for chest pain, palpitations and leg swelling.  ?Gastrointestinal:  Negative for abdominal pain, constipation, diarrhea, nausea  and vomiting.  ?Endocrine: Negative.   ?Genitourinary: Negative.  Negative for difficulty urinating.  ?Musculoskeletal:  Positive for arthralgias. Negative for back pain.  ?Skin:  Positive for color change and rash.  ?Neurological: Negative.  Negative for dizziness, weakness and headaches.  ?Hematological:  Negative for adenopathy. Does not bruise/bleed easily.  ?Psychiatric/Behavioral: Negative.    ? ?Objective:  ?BP 138/70 (BP Location: Left Arm, Patient Position: Sitting, Cuff Size: Large)   Pulse 72   Temp 98.2 ?F (36.8 ?C) (Oral)   Ht '5\' 9"'$  (1.753 m)   Wt 198 lb (89.8 kg)   SpO2 95%   BMI 29.24 kg/m?  ? ?BP Readings from Last 3 Encounters:  ?10/09/21 138/70  ?02/27/21 138/70  ?02/21/21 (!) 158/72  ? ? ?Wt Readings from Last 3 Encounters:  ?10/09/21 198 lb (89.8 kg)  ?02/27/21 193 lb 12.8 oz (87.9 kg)  ?02/21/21 195 lb 3.2 oz (88.5 kg)  ? ? ?Physical Exam ?Vitals reviewed.  ?HENT:  ?   Nose: Nose normal.  ?   Mouth/Throat:  ?   Mouth: Mucous membranes are moist.  ?Eyes:  ?   General: No scleral icterus. ?   Conjunctiva/sclera: Conjunctivae normal.  ?Cardiovascular:  ?   Rate and Rhythm: Normal rate and regular rhythm.  ?  Heart sounds: No murmur heard. ?Pulmonary:  ?   Effort: Pulmonary effort is normal.  ?   Breath sounds: No stridor. No wheezing, rhonchi or rales.  ?Abdominal:  ?   General: Abdomen is flat.  ?   Palpations: There is no mass.  ?   Tenderness: There is no abdominal tenderness. There is no guarding.  ?   Hernia: No hernia is present.  ?Musculoskeletal:     ?   General: Normal range of motion.  ?   Cervical back: Neck supple.  ?   Right lower leg: No edema.  ?   Left lower leg: No edema.  ?Lymphadenopathy:  ?   Cervical: No cervical adenopathy.  ?Skin: ?   Coloration: Skin is not jaundiced.  ?   Findings: Erythema and rash present. Rash is macular and papular. Rash is not nodular, purpuric, pustular, scaling, urticarial or vesicular.  ?   Comments: Diffusely over the right hip there are  blanching erythematous, coalesced macules and papules.  ?Neurological:  ?   General: No focal deficit present.  ?   Mental Status: He is alert.  ?Psychiatric:     ?   Mood and Affect: Mood normal.     ?   Behavior: Behavior normal.  ? ? ?Lab Results  ?Component Value Date  ? WBC 10.8 (H) 10/09/2021  ? HGB 13.2 10/09/2021  ? HCT 39.0 10/09/2021  ? PLT 236.0 10/09/2021  ? GLUCOSE 96 10/09/2021  ? CHOL 120 02/05/2021  ? TRIG 38 02/05/2021  ? HDL 64 02/05/2021  ? [place] 48 02/05/2021  ? ALT 14 02/05/2021  ? AST 20 02/05/2021  ? NA 128 (L) 10/09/2021  ? K 4.8 10/09/2021  ? CL 93 (L) 10/09/2021  ? CREATININE 1.10 10/09/2021  ? BUN 24 (H) 10/09/2021  ? CO2 27 10/09/2021  ? TSH 4.89 12/15/2020  ? PSA 2.50 12/15/2020  ? INR 1.0 02/04/2021  ? HGBA1C 5.8 (H) 12/10/2020  ? ? ?CT HEAD WO CONTRAST ? ?Result Date: 02/04/2021 ?CLINICAL DATA:  Dizziness and change in vision. EXAM: CT HEAD WITHOUT CONTRAST TECHNIQUE: Contiguous axial images were obtained from the base of the skull through the vertex without intravenous contrast. COMPARISON:  Brain MRI July 16-2022 FINDINGS: Brain: No evidence of acute infarction, hemorrhage, hydrocephalus, extra-axial collection or mass lesion/mass effect. Area of encephalomalacia in the right occipital lobe from prior known ischemic infarct. Similar small area of encephalomalacia in the posterior right frontal lobe. Deep white matter microangiopathy. Vascular: No hyperdense vessel or unexpected calcification. Skull: Normal. Negative for fracture or focal lesion. Sinuses/Orbits: Chronic sinusitis of the ethmoid, maxillary and frontal sinuses. Other: None. IMPRESSION: 1. No acute intracranial abnormality. 2. Encephalomalacia in the right occipital lobe from prior known ischemic infarct. 3. Chronic small area of encephalomalacia in the posterior right frontal lobe. 4. Deep white matter microangiopathy. 5. Chronic sinusitis. Electronically Signed   By: Fidela Salisbury M.D.   On: 02/04/2021 15:07   ? ?MR BRAIN WO CONTRAST ? ?Result Date: 02/04/2021 ?CLINICAL DATA:  Neuro deficit, acute, stroke suspected EXAM: MRI HEAD WITHOUT CONTRAST TECHNIQUE: Multiplanar, multiecho pulse sequences of the brain and surrounding structures were obtained without intravenous contrast. COMPARISON:  Same day head CT. FINDINGS: Mildly motion limited study. Brain: Small acute or subacute infarct in the right frontal cortex (series 2, image 36) and punctate acute infarct in the left frontal cortex (series 2, image 38) no evidence of acute hemorrhage, hydrocephalus, mass lesion, midline shift, or extra-axial fluid collection. Encephalomalacia  in the medial right occipital lobe, compatible with sequela of previously seen infarct. Some curvilinear T2 hypointensity and susceptibility artifact in this region likely is the sequela of prior hemorrhage. No correlate hyperdensity on same day head CT to suggest acute hemorrhage. Additional remote infarct in the left medial occipital lobe. Additional scattered moderate to advanced T2 hyperintensities within the white matter, nonspecific but compatible with chronic microvascular ischemic disease. Small remote lacunar infarcts in the cerebellum bilaterally. Vascular: Major arterial flow voids are maintained at the skull base. Skull and upper cervical spine: Normal marrow signal. Sinuses/Orbits: Moderate paranasal sinus mucosal thickening. Other: No sizable mastoid effusions. IMPRESSION: 1. Small probable acute or subacute infarct in the right frontal cortex and punctate acute infarct in the left frontal cortex 2. Remote bilateral occipital lobe infarcts with evidence of prior hemorrhage on the right. 3. Small remote bilateral cerebellar lacunar infarcts. 4. Moderate to advanced chronic microvascular ischemic disease. 5. Moderate paranasal sinus mucosal thickening. Electronically Signed   By: Margaretha Sheffield M.D.   On: 02/04/2021 18:43  ? ?ECHOCARDIOGRAM COMPLETE ? ?Result Date: 02/05/2021 ?    ECHOCARDIOGRAM REPORT   Patient Name:   XAVION MUSCAT Date of Exam: 02/05/2021 Medical Rec #:  [id number]        Height:       69.0 in Accession #:    [id number]       Weight:       193.0 lb Date of Birth:  Jun 13, 1940

## 2021-10-10 NOTE — Progress Notes (Signed)
Kenneth Montoya D.[place] Phone: [phone]   Assessment and Plan:     1. Right hip pain 2. Left hip pain 3. Bilateral primary osteoarthritis of hip -Chronic with exacerbation, initial sports medicine visit - Acute flare of bilateral hip pain consistent with osteoarthritis that was previously improved with intra-articular hip injections and patient requesting bilateral intra-articular CSI.  Tolerated well per note below - Tylenol/NSAIDs as needed for pain relief - Start hip ROM HEP - X-rays obtained in clinic.  My interpretation: No acute fracture or dislocation.  Moderate to severe osteoarthritis of bilateral hips, worse on right compared to left with decreased joint space, sclerosis  Procedure: Ultrasound Guided Hip Acetabulofemoral Joint Injection Side: Bilateral Diagnosis: Acute flare of hip osteoarthritis [name] Indication:  - accuracy is paramount for diagnosis - to ensure therapeutic efficacy or procedural success - to reduce procedural risk  After explaining the procedure, viable alternatives, risks, and answering any questions, consent was given verbally. The site was cleaned with chlorhexidine prep. An ultrasound transducer was placed on the anterior thigh/hip.   The acetabular joint, labrum, and femoral shaft were identified.  The neurovascular structures were identified and an approach was found specifically avoiding these structures.  A steroid injection was performed under ultrasound guidance with sterile technique using 42m of 1% lidocaine without epinephrine and 40 mg of triamcinolone (KENALOG) '40mg'$ /ml. This was well tolerated and resulted in some relief.  Needle was removed and dressing placed and post injection instructions were given including  a discussion of likely return of pain today after the anesthetic wears off (with the possibility of worsened pain) until the steroid starts to work in 1-3  days.  Procedure was repeated on contralateral side.  Pt was advised to call or return to clinic if these symptoms worsen or fail to improve as anticipated.    Pertinent previous records reviewed include none   Follow Up: 2 to 3 weeks for reevaluation.  Could consider advanced imaging if no improvement or worsening of symptoms   Subjective:   I, Kenneth Montoya, am serving as a sEducation administratorfor Doctor BGlennon Mac Chief Complaint: bilateral hip pain   HPI:   5/18/023 Patient is a 81year old male complaining of bilateral hip pain. Patient states that the right hip is more painful states he has bursitis as well, hip pain has been going on for a couple of months that has progressively gotten worse, pain is in the groin and the lateral hip, has been taking 800 mg 2 times a day of aleve or naproxen 2 acetaminophen 2000 mg daily  , doesn't really have any pain 2 aleve and 4 tylenol , has been working out walking and stretching light lifting   Relevant Historical Information: Hypertension, history of CVA, GERD  Additional pertinent review of systems negative.   Current Outpatient Medications:    acetaminophen (TYLENOL) 500 MG tablet, Take 1,000 mg by mouth in the morning and at bedtime., Disp: , Rfl:    amLODipine (NORVASC) 10 MG tablet, TAKE 1 TABLET BY MOUTH AT  BEDTIME, Disp: 90 tablet, Rfl: 3   ascorbic acid (VITAMIN C) 1000 MG tablet, Take 1,000 mg by mouth at bedtime., Disp: , Rfl:    aspirin EC 81 MG tablet, Take 81 mg by mouth daily. Swallow whole., Disp: , Rfl:    atorvastatin (LIPITOR) 10 MG tablet, TAKE 1 TABLET(10 MG) BY MOUTH DAILY, Disp: 90 tablet, Rfl: 1  clopidogrel (PLAVIX) 75 MG tablet, Take 1 tablet (75 mg total) by mouth daily., Disp: 90 tablet, Rfl: 1   fluocinonide-emollient (LIDEX-E) 0.05 % cream, Apply 1 application. topically 2 (two) times daily., Disp: 60 g, Rfl: 0   fluticasone (FLONASE) 50 MCG/ACT nasal spray, PLACE 1 SPRAY IN BOTH  NOSTRILS DAILY, Disp: 32 g, Rfl:  1   hydrOXYzine (ATARAX) 10 MG tablet, Take 1 tablet (10 mg total) by mouth 3 (three) times daily as needed for up to 7 days., Disp: 30 tablet, Rfl: 0   losartan (COZAAR) 100 MG tablet, TAKE 1 TABLET BY MOUTH  DAILY, Disp: 90 tablet, Rfl: 1   naproxen sodium (ALEVE) 220 MG tablet, Take 220 mg by mouth daily as needed., Disp: , Rfl:    olopatadine (PATANOL) 0.1 % ophthalmic solution, Place 1 drop into both eyes at bedtime., Disp: , Rfl:    omeprazole (PRILOSEC) 40 MG capsule, TAKE 1 CAPSULE BY MOUTH  DAILY, Disp: 90 capsule, Rfl: 3   PREVIDENT 5000 PLUS 1.1 % CREA dental cream, SMARTSIG:Application By Mouth, Disp: , Rfl:    psyllium (METAMUCIL) 58.6 % powder, Take 1 packet by mouth at bedtime., Disp: , Rfl:    tamsulosin (FLOMAX) 0.4 MG CAPS capsule, Take 0.4 mg by mouth at bedtime., Disp: , Rfl:    Objective:     Vitals:   10/11/21 1403  BP: 118/80  Pulse: 72  SpO2: 93%  Weight: 198 lb (89.8 kg)  Height: '5\' 9"'$  (1.753 m)      Body mass index is 29.24 kg/m.    Physical Exam:    General: awake, alert, and oriented no acute distress, nontoxic Skin: no suspicious lesions or rashes Neuro:sensation intact distally with no dificits, normal muscle tone, no atrophy, strength 5/5 in all tested lower ext groups Psych: normal mood and affect, speech clear  Bilateral hip: No deformity, swelling or wasting ROM Flexion 80, ext 20, IR 30, ER 40 TTP gluteal musculature NTTP over the hip flexors, greater troch,  si joint, lumbar spine Negative log roll with FROM Positive bilateralFABER Negative FADIR, though moderately reduced internal rotation Negative Piriformis test   Gait normal    Electronically signed by:  Kenneth Montoya D.[facility] 3:03 PM 10/11/21

## 2021-10-11 ENCOUNTER — Ambulatory Visit (INDEPENDENT_AMBULATORY_CARE_PROVIDER_SITE_OTHER): Payer: Medicare Other | Admitting: Sports Medicine

## 2021-10-11 ENCOUNTER — Ambulatory Visit: Payer: Self-pay

## 2021-10-11 ENCOUNTER — Ambulatory Visit (INDEPENDENT_AMBULATORY_CARE_PROVIDER_SITE_OTHER): Payer: Medicare Other

## 2021-10-11 ENCOUNTER — Other Ambulatory Visit: Payer: Self-pay | Admitting: Sports Medicine

## 2021-10-11 VITALS — BP 118/80 | HR 72 | Ht 69.0 in | Wt 198.0 lb

## 2021-10-11 DIAGNOSIS — M160 Bilateral primary osteoarthritis of hip: Secondary | ICD-10-CM | POA: Diagnosis not present

## 2021-10-11 DIAGNOSIS — M25551 Pain in right hip: Secondary | ICD-10-CM

## 2021-10-11 DIAGNOSIS — M25552 Pain in left hip: Secondary | ICD-10-CM | POA: Diagnosis not present

## 2021-10-11 NOTE — Patient Instructions (Addendum)
Good to see you  Hip HEP  2-3 week follow

## 2021-10-23 ENCOUNTER — Ambulatory Visit (INDEPENDENT_AMBULATORY_CARE_PROVIDER_SITE_OTHER): Payer: Medicare Other | Admitting: Sports Medicine

## 2021-10-23 ENCOUNTER — Ambulatory Visit (INDEPENDENT_AMBULATORY_CARE_PROVIDER_SITE_OTHER): Payer: Medicare Other

## 2021-10-23 VITALS — BP 128/84 | HR 67 | Ht 69.0 in | Wt 198.0 lb

## 2021-10-23 DIAGNOSIS — M5136 Other intervertebral disc degeneration, lumbar region: Secondary | ICD-10-CM | POA: Diagnosis not present

## 2021-10-23 DIAGNOSIS — M545 Low back pain, unspecified: Secondary | ICD-10-CM

## 2021-10-23 DIAGNOSIS — G8929 Other chronic pain: Secondary | ICD-10-CM

## 2021-10-23 DIAGNOSIS — M25551 Pain in right hip: Secondary | ICD-10-CM

## 2021-10-23 DIAGNOSIS — M25552 Pain in left hip: Secondary | ICD-10-CM | POA: Diagnosis not present

## 2021-10-23 DIAGNOSIS — M160 Bilateral primary osteoarthritis of hip: Secondary | ICD-10-CM

## 2021-10-23 NOTE — Patient Instructions (Addendum)
Good to see you Hip back core HEP  4 week follow up

## 2021-10-23 NOTE — Progress Notes (Signed)
Kenneth Montoya D.[place] Phone: [phone]   Assessment and Plan:     1. Chronic bilateral low back pain without sciatica 2. DDD (degenerative disc disease), lumbar -Chronic with exacerbation, initial sports medicine visit - Recurrence of low back pain with history lumbar procedure for evaluation, and intermittent back pain for years without radiculopathy - X-ray obtained in clinic.  My interpretation: No acute fracture or vertebral collapse.  Mild decreased disc height in T12, L1.  Moderate cortical changes most prominent at L4-L5 - Patient elects to start with conservative therapy focusing on core, back, hip strengthening to decrease flares of pain.  HEP provided - Start Tylenol 500 to 1000 mg tablets 2-3 times a day for day-to-day pain relief   3. Right hip pain 4. Left hip pain 5. Bilateral primary osteoarthritis of hip -Chronic with exacerbation, subsequent visit - Patient had moderate relief of bilaterally exacerbated hip pain after bilateral intra-articular CSI at previous office visit on 10/11/2021 - Continue HEP for hips - Can repeat intra-articular CSI as needed, goal of at least 3 months in between injections   Pertinent previous records reviewed include none   Follow Up: 4 weeks for reevaluation to see if conservative treatment plan is beneficial for patient.  Could consider formal physical therapy versus advanced imaging if no improvement or worsening of symptoms   Subjective:   I, Kenneth Montoya, am serving as a Education administrator for Doctor [name]   Chief Complaint: bilateral hip pain    HPI:    5/18/023 Patient is a 81 year old male complaining of bilateral hip pain. Patient states that the right hip is more painful states he has bursitis as well, hip pain has been going on for a couple of months that has progressively gotten worse, pain is in the groin and the lateral hip, has been taking  800 mg 2 times a day of aleve or naproxen 2 acetaminophen 2000 mg daily  , doesn't really have any pain 2 aleve and 4 tylenol , has been working out walking and stretching light lifting   10/23/2021 Patient states that he has want some xray for his spinal stenosis 2017 he had a lumbar something... would like some HEP for strength and stretching    Relevant Historical Information: Hypertension, history of CVA, GERD  Additional pertinent review of systems negative.   Current Outpatient Medications:    acetaminophen (TYLENOL) 500 MG tablet, Take 1,000 mg by mouth in the morning and at bedtime., Disp: , Rfl:    amLODipine (NORVASC) 10 MG tablet, TAKE 1 TABLET BY MOUTH AT  BEDTIME, Disp: 90 tablet, Rfl: 3   ascorbic acid (VITAMIN C) 1000 MG tablet, Take 1,000 mg by mouth at bedtime., Disp: , Rfl:    aspirin EC 81 MG tablet, Take 81 mg by mouth daily. Swallow whole., Disp: , Rfl:    atorvastatin (LIPITOR) 10 MG tablet, TAKE 1 TABLET(10 MG) BY MOUTH DAILY, Disp: 90 tablet, Rfl: 1   clopidogrel (PLAVIX) 75 MG tablet, Take 1 tablet (75 mg total) by mouth daily., Disp: 90 tablet, Rfl: 1   fluocinonide-emollient (LIDEX-E) 0.05 % cream, Apply 1 application. topically 2 (two) times daily., Disp: 60 g, Rfl: 0   fluticasone (FLONASE) 50 MCG/ACT nasal spray, PLACE 1 SPRAY IN BOTH  NOSTRILS DAILY, Disp: 32 g, Rfl: 1   losartan (COZAAR) 100 MG tablet, TAKE 1 TABLET BY MOUTH  DAILY, Disp: 90 tablet, Rfl: 1  naproxen sodium (ALEVE) 220 MG tablet, Take 220 mg by mouth daily as needed., Disp: , Rfl:    olopatadine (PATANOL) 0.1 % ophthalmic solution, Place 1 drop into both eyes at bedtime., Disp: , Rfl:    omeprazole (PRILOSEC) 40 MG capsule, TAKE 1 CAPSULE BY MOUTH  DAILY, Disp: 90 capsule, Rfl: 3   PREVIDENT 5000 PLUS 1.1 % CREA dental cream, SMARTSIG:Application By Mouth, Disp: , Rfl:    psyllium (METAMUCIL) 58.6 % powder, Take 1 packet by mouth at bedtime., Disp: , Rfl:    tamsulosin (FLOMAX) 0.4 MG CAPS  capsule, Take 0.4 mg by mouth at bedtime., Disp: , Rfl:    Objective:     Vitals:   10/23/21 1029  BP: 128/84  Pulse: 67  SpO2: 99%  Weight: 198 lb (89.8 kg)  Height: '5\' 9"'$  (1.753 m)      Body mass index is 29.24 kg/m.    Physical Exam:    Gen: Appears well, nad, nontoxic and pleasant Psych: Alert and oriented, appropriate mood and affect Neuro: sensation intact, strength is 5/5 in upper and lower extremities, muscle tone wnl Skin: no susupicious lesions or rashes  Back - Normal skin, Spine with mild curvature and no deformity.   No tenderness to vertebral process palpation.   Paraspinous muscles are mildly tender and without spasm Straight leg raise negative Stiffness with lumbar flexion and extension   Electronically signed by:  Kenneth Montoya D.[facility] 11:39 AM 10/23/21

## 2021-10-29 ENCOUNTER — Ambulatory Visit: Payer: Medicare Other | Admitting: Sports Medicine

## 2021-11-06 ENCOUNTER — Other Ambulatory Visit (INDEPENDENT_AMBULATORY_CARE_PROVIDER_SITE_OTHER): Payer: Medicare Other

## 2021-11-06 DIAGNOSIS — R3912 Poor urinary stream: Secondary | ICD-10-CM | POA: Diagnosis not present

## 2021-11-06 DIAGNOSIS — N401 Enlarged prostate with lower urinary tract symptoms: Secondary | ICD-10-CM

## 2021-11-06 DIAGNOSIS — E785 Hyperlipidemia, unspecified: Secondary | ICD-10-CM

## 2021-11-06 LAB — PSA: PSA: 2.12 ng/mL (ref 0.10–4.00)

## 2021-11-13 NOTE — Progress Notes (Unsigned)
    [name] D.[place] Phone: [phone]   Assessment and Plan:     There are no diagnoses linked to this encounter.  ***   Pertinent previous records reviewed include ***   Follow Up: ***     Subjective:   I, Messina Kosinski, am serving as a Education administrator for Doctor [name]   Chief Complaint: bilateral hip pain    HPI:    5/18/023 Patient is a 81 year old male complaining of bilateral hip pain. Patient states that the right hip is more painful states he has bursitis as well, hip pain has been going on for a couple of months that has progressively gotten worse, pain is in the groin and the lateral hip, has been taking 800 mg 2 times a day of aleve or naproxen 2 acetaminophen 2000 mg daily  , doesn't really have any pain 2 aleve and 4 tylenol , has been working out walking and stretching light lifting    10/23/2021 Patient states that he has want some xray for his spinal stenosis 2017 he had a lumbar something... would like some HEP for strength and stretching    11/14/2021 Patient states Relevant Historical Information: Hypertension, history of CVA, GERD  Additional pertinent review of systems negative.   Current Outpatient Medications:    acetaminophen (TYLENOL) 500 MG tablet, Take 1,000 mg by mouth in the morning and at bedtime., Disp: , Rfl:    amLODipine (NORVASC) 10 MG tablet, TAKE 1 TABLET BY MOUTH AT  BEDTIME, Disp: 90 tablet, Rfl: 3   ascorbic acid (VITAMIN C) 1000 MG tablet, Take 1,000 mg by mouth at bedtime., Disp: , Rfl:    aspirin EC 81 MG tablet, Take 81 mg by mouth daily. Swallow whole., Disp: , Rfl:    atorvastatin (LIPITOR) 10 MG tablet, TAKE 1 TABLET(10 MG) BY MOUTH DAILY, Disp: 90 tablet, Rfl: 1   clopidogrel (PLAVIX) 75 MG tablet, Take 1 tablet (75 mg total) by mouth daily., Disp: 90 tablet, Rfl: 1   fluocinonide-emollient (LIDEX-E) 0.05 % cream, Apply 1 application.  topically 2 (two) times daily., Disp: 60 g, Rfl: 0   fluticasone (FLONASE) 50 MCG/ACT nasal spray, PLACE 1 SPRAY IN BOTH  NOSTRILS DAILY, Disp: 32 g, Rfl: 1   losartan (COZAAR) 100 MG tablet, TAKE 1 TABLET BY MOUTH  DAILY, Disp: 90 tablet, Rfl: 1   naproxen sodium (ALEVE) 220 MG tablet, Take 220 mg by mouth daily as needed., Disp: , Rfl:    olopatadine (PATANOL) 0.1 % ophthalmic solution, Place 1 drop into both eyes at bedtime., Disp: , Rfl:    omeprazole (PRILOSEC) 40 MG capsule, TAKE 1 CAPSULE BY MOUTH  DAILY, Disp: 90 capsule, Rfl: 3   PREVIDENT 5000 PLUS 1.1 % CREA dental cream, SMARTSIG:Application By Mouth, Disp: , Rfl:    psyllium (METAMUCIL) 58.6 % powder, Take 1 packet by mouth at bedtime., Disp: , Rfl:    tamsulosin (FLOMAX) 0.4 MG CAPS capsule, Take 0.4 mg by mouth at bedtime., Disp: , Rfl:    Objective:     There were no vitals filed for this visit.    There is no height or weight on file to calculate BMI.    Physical Exam:    ***   Electronically signed by:  [name] D.[facility] 8:00 AM 11/13/21

## 2021-11-14 ENCOUNTER — Ambulatory Visit (INDEPENDENT_AMBULATORY_CARE_PROVIDER_SITE_OTHER): Payer: Medicare Other | Admitting: Sports Medicine

## 2021-11-14 ENCOUNTER — Encounter: Payer: Self-pay | Admitting: Internal Medicine

## 2021-11-14 ENCOUNTER — Ambulatory Visit (INDEPENDENT_AMBULATORY_CARE_PROVIDER_SITE_OTHER): Payer: Medicare Other | Admitting: Internal Medicine

## 2021-11-14 VITALS — BP 142/80 | HR 82 | Ht 69.0 in | Wt 196.0 lb

## 2021-11-14 VITALS — BP 138/82 | HR 80 | Temp 97.9°F | Ht 69.0 in | Wt 196.0 lb

## 2021-11-14 DIAGNOSIS — M160 Bilateral primary osteoarthritis of hip: Secondary | ICD-10-CM | POA: Diagnosis not present

## 2021-11-14 DIAGNOSIS — F5104 Psychophysiologic insomnia: Secondary | ICD-10-CM | POA: Diagnosis not present

## 2021-11-14 DIAGNOSIS — I10 Essential (primary) hypertension: Secondary | ICD-10-CM | POA: Diagnosis not present

## 2021-11-14 DIAGNOSIS — M545 Low back pain, unspecified: Secondary | ICD-10-CM | POA: Diagnosis not present

## 2021-11-14 DIAGNOSIS — F411 Generalized anxiety disorder: Secondary | ICD-10-CM

## 2021-11-14 DIAGNOSIS — I6300 Cerebral infarction due to thrombosis of unspecified precerebral artery: Secondary | ICD-10-CM | POA: Diagnosis not present

## 2021-11-14 DIAGNOSIS — G8929 Other chronic pain: Secondary | ICD-10-CM

## 2021-11-14 MED ORDER — TRAZODONE HCL 50 MG PO TABS: 50.0000 mg | ORAL_TABLET | Freq: Every day | ORAL | 1 refills | Status: DC

## 2021-11-14 NOTE — Progress Notes (Signed)
Subjective:  Patient ID: Kenneth Montoya, male    DOB: Apr 11, 1941  Age: 81 y.o. MRN: [id number]  CC: Hypertension   HPI Kenneth Montoya presents for f/up -  He complains of insomnia and generalized anxiety.  He tells me he is ready to start taking a medication.  His blood pressure is well controlled and he is active.  He denies chest pain, shortness of breath, dizziness, lightheadedness, or edema.  Outpatient Medications Prior to Visit  Medication Sig Dispense Refill   acetaminophen (TYLENOL) 500 MG tablet Take 1,000 mg by mouth in the morning and at bedtime.     amLODipine (NORVASC) 10 MG tablet TAKE 1 TABLET BY MOUTH AT  BEDTIME 90 tablet 3   ascorbic acid (VITAMIN C) 1000 MG tablet Take 1,000 mg by mouth at bedtime.     aspirin EC 81 MG tablet Take 81 mg by mouth daily. Swallow whole.     atorvastatin (LIPITOR) 10 MG tablet TAKE 1 TABLET(10 MG) BY MOUTH DAILY 90 tablet 1   clopidogrel (PLAVIX) 75 MG tablet Take 1 tablet (75 mg total) by mouth daily. 90 tablet 1   fluocinonide-emollient (LIDEX-E) 0.05 % cream Apply 1 application. topically 2 (two) times daily. 60 g 0   fluticasone (FLONASE) 50 MCG/ACT nasal spray PLACE 1 SPRAY IN BOTH  NOSTRILS DAILY 32 g 1   losartan (COZAAR) 100 MG tablet TAKE 1 TABLET BY MOUTH  DAILY 90 tablet 1   naproxen sodium (ALEVE) 220 MG tablet Take 220 mg by mouth daily as needed.     olopatadine (PATANOL) 0.1 % ophthalmic solution Place 1 drop into both eyes at bedtime.     omeprazole (PRILOSEC) 40 MG capsule TAKE 1 CAPSULE BY MOUTH  DAILY 90 capsule 3   PREVIDENT 5000 PLUS 1.1 % CREA dental cream SMARTSIG:Application By Mouth     psyllium (METAMUCIL) 58.6 % powder Take 1 packet by mouth at bedtime.     tamsulosin (FLOMAX) 0.4 MG CAPS capsule Take 0.4 mg by mouth at bedtime.     No facility-administered medications prior to visit.    ROS Review of Systems  Constitutional:  Negative for diaphoresis and fatigue.  HENT: Negative.    Eyes: Negative.    Respiratory:  Negative for cough, chest tightness and shortness of breath.   Cardiovascular:  Negative for chest pain, palpitations and leg swelling.  Gastrointestinal:  Negative for abdominal pain, diarrhea and nausea.  Endocrine: Negative.   Genitourinary: Negative.  Negative for difficulty urinating.  Musculoskeletal: Negative.  Negative for myalgias.  Skin: Negative.   Neurological:  Negative for dizziness, weakness and headaches.  Hematological:  Negative for adenopathy. Does not bruise/bleed easily.  Psychiatric/Behavioral:  Positive for sleep disturbance. Negative for decreased concentration, dysphoric mood and suicidal ideas. The patient is nervous/anxious.     Objective:  BP 138/82 (BP Location: Left Arm, Patient Position: Sitting, Cuff Size: Large)   Pulse 80   Temp 97.9 F (36.6 C) (Oral)   Ht '5\' 9"'$  (1.753 m)   Wt 196 lb (88.9 kg)   SpO2 98%   BMI 28.94 kg/m   BP Readings from Last 3 Encounters:  11/14/21 138/82  10/23/21 128/84  10/11/21 118/80    Wt Readings from Last 3 Encounters:  11/14/21 196 lb (88.9 kg)  10/23/21 198 lb (89.8 kg)  10/11/21 198 lb (89.8 kg)    Physical Exam Vitals reviewed.  HENT:     Nose: Nose normal.     Mouth/Throat:  Mouth: Mucous membranes are moist.  Eyes:     General: No scleral icterus.    Conjunctiva/sclera: Conjunctivae normal.  Cardiovascular:     Rate and Rhythm: Normal rate and regular rhythm.     Heart sounds: No murmur heard. Pulmonary:     Effort: Pulmonary effort is normal.     Breath sounds: No stridor. No wheezing, rhonchi or rales.  Abdominal:     General: Abdomen is flat.     Palpations: There is no mass.     Tenderness: There is no abdominal tenderness. There is no guarding.     Hernia: No hernia is present.  Musculoskeletal:        General: Normal range of motion.     Cervical back: Neck supple.     Right lower leg: No edema.     Left lower leg: No edema.  Lymphadenopathy:     Cervical: No  cervical adenopathy.  Skin:    General: Skin is warm.  Neurological:     General: No focal deficit present.     Mental Status: He is alert. Mental status is at baseline.  Psychiatric:        Attention and Perception: He is inattentive.        Mood and Affect: Mood is anxious. Affect is not flat or tearful.        Speech: Speech normal.        Behavior: Behavior normal.        Thought Content: Thought content normal. Thought content is not paranoid or delusional. Thought content does not include homicidal or suicidal ideation.        Cognition and Memory: Cognition normal.     Lab Results  Component Value Date   WBC 10.8 (H) 10/09/2021   HGB 13.2 10/09/2021   HCT 39.0 10/09/2021   PLT 236.0 10/09/2021   GLUCOSE 96 10/09/2021   CHOL 120 02/05/2021   TRIG 38 02/05/2021   HDL 64 02/05/2021   LDLCALC 48 02/05/2021   ALT 14 02/05/2021   AST 20 02/05/2021   NA 128 (L) 10/09/2021   K 4.8 10/09/2021   CL 93 (L) 10/09/2021   CREATININE 1.10 10/09/2021   BUN 24 (H) 10/09/2021   CO2 27 10/09/2021   TSH 4.89 12/15/2020   PSA 2.12 11/06/2021   INR 1.0 02/04/2021   HGBA1C 5.8 (H) 12/10/2020    ECHOCARDIOGRAM COMPLETE  Result Date: 02/05/2021    ECHOCARDIOGRAM REPORT   Patient Name:   Kenneth Montoya Date of Exam: 02/05/2021 Medical Rec #:  [id number]        Height:       69.0 in Accession #:    [id number]       Weight:       193.0 lb Date of Birth:  November 22, 1940         BSA:          2.035 m Patient Age:    33 years         BP:           140/67 mmHg Patient Gender: M                HR:           62 bpm. Exam Location:  Inpatient Procedure: 2D Echo, Cardiac Doppler, Color Doppler and Intracardiac            Opacification Agent Indications:    Stroke  History:  Patient has no prior history of Echocardiogram examinations.                 Risk Factors:Hypertension. H/o COVID-19. H/o stroke.  Sonographer:    Clayton Lefort RDCS (AE) Referring Phys: Earnie Larsson LAMA  Sonographer Comments:  Technically difficult study due to poor echo windows. IMPRESSIONS  1. No apical thrombus by Definity contrast. Left ventricular ejection fraction, by estimation, is 60 to 65%. The left ventricle has normal function. The left ventricle has no regional wall motion abnormalities. There is moderate left ventricular hypertrophy. Left ventricular diastolic parameters are consistent with Grade I diastolic dysfunction (impaired relaxation).  2. Right ventricular systolic function is mildly reduced. The right ventricular size is normal.  3. The mitral valve was not well visualized. No evidence of mitral valve regurgitation.  4. The aortic valve is tricuspid. Aortic valve regurgitation is not visualized. Comparison(s): No prior Echocardiogram. FINDINGS  Left Ventricle: No apical thrombus by Definity contrast. Left ventricular ejection fraction, by estimation, is 60 to 65%. The left ventricle has normal function. The left ventricle has no regional wall motion abnormalities. Definity contrast agent was given IV to delineate the left ventricular endocardial borders. The left ventricular internal cavity size was normal in size. There is moderate left ventricular hypertrophy. Abnormal (paradoxical) septal motion, consistent with left bundle branch block. Left ventricular diastolic parameters are consistent with Grade I diastolic dysfunction (impaired relaxation). Indeterminate filling pressures. Right Ventricle: The right ventricular size is normal. No increase in right ventricular wall thickness. Right ventricular systolic function is mildly reduced. Left Atrium: Left atrial size was normal in size. Right Atrium: Right atrial size was normal in size. Pericardium: There is no evidence of pericardial effusion. Mitral Valve: The mitral valve was not well visualized. No evidence of mitral valve regurgitation. MV peak gradient, 5.5 mmHg. The mean mitral valve gradient is 1.0 mmHg. Tricuspid Valve: The tricuspid valve is not well  visualized. Tricuspid valve regurgitation is not demonstrated. Aortic Valve: The aortic valve is tricuspid. Aortic valve regurgitation is not visualized. Aortic valve mean gradient measures 3.0 mmHg. Aortic valve peak gradient measures 5.9 mmHg. Aortic valve area, by VTI measures 2.13 cm. Pulmonic Valve: The pulmonic valve was normal in structure. Pulmonic valve regurgitation is not visualized. Aorta: The aortic root and ascending aorta are structurally normal, with no evidence of dilitation. IAS/Shunts: No atrial level shunt detected by color flow Doppler.  LEFT VENTRICLE PLAX 2D LVIDd:         5.00 cm  Diastology LVIDs:         4.30 cm  LV e' medial:    3.99 cm/s LV PW:         1.30 cm  LV E/e' medial:  13.8 LV IVS:        1.50 cm  LV e' lateral:   7.49 cm/s LVOT diam:     1.90 cm  LV E/e' lateral: 7.3 LV SV:         54 LV SV Index:   26 LVOT Area:     2.84 cm  RIGHT VENTRICLE RV Basal diam:  2.50 cm RV S prime:     8.93 cm/s TAPSE (M-mode): 2.8 cm LEFT ATRIUM           Index       RIGHT ATRIUM           Index LA diam:      3.10 cm 1.52 cm/m  RA Area:     14.30  cm LA Vol (A4C): 34.2 ml 16.81 ml/m RA Volume:   35.00 ml  17.20 ml/m  AORTIC VALVE AV Area (Vmax):    1.97 cm AV Area (Vmean):   1.93 cm AV Area (VTI):     2.13 cm AV Vmax:           121.00 cm/s AV Vmean:          81.600 cm/s AV VTI:            0.251 m AV Peak Grad:      5.9 mmHg AV Mean Grad:      3.0 mmHg LVOT Vmax:         84.20 cm/s LVOT Vmean:        55.500 cm/s LVOT VTI:          0.189 m LVOT/AV VTI ratio: 0.75  AORTA Ao Root diam: 3.70 cm MITRAL VALVE MV Area (PHT): 1.82 cm     SHUNTS MV Area VTI:   1.67 cm     Systemic VTI:  0.19 m MV Peak grad:  5.5 mmHg     Systemic Diam: 1.90 cm MV Mean grad:  1.0 mmHg MV Vmax:       1.17 m/s MV Vmean:      51.3 cm/s MV Decel Time: 416 msec MV E velocity: 55.00 cm/s MV A velocity: 113.00 cm/s MV E/A ratio:  0.49 Lyman Bishop MD Electronically signed by Lyman Bishop MD Signature Date/Time:  02/05/2021/2:09:28 PM    Final    MR BRAIN WO CONTRAST  Result Date: 02/04/2021 CLINICAL DATA:  Neuro deficit, acute, stroke suspected EXAM: MRI HEAD WITHOUT CONTRAST TECHNIQUE: Multiplanar, multiecho pulse sequences of the brain and surrounding structures were obtained without intravenous contrast. COMPARISON:  Same day head CT. FINDINGS: Mildly motion limited study. Brain: Small acute or subacute infarct in the right frontal cortex (series 2, image 36) and punctate acute infarct in the left frontal cortex (series 2, image 38) no evidence of acute hemorrhage, hydrocephalus, mass lesion, midline shift, or extra-axial fluid collection. Encephalomalacia in the medial right occipital lobe, compatible with sequela of previously seen infarct. Some curvilinear T2 hypointensity and susceptibility artifact in this region likely is the sequela of prior hemorrhage. No correlate hyperdensity on same day head CT to suggest acute hemorrhage. Additional remote infarct in the left medial occipital lobe. Additional scattered moderate to advanced T2 hyperintensities within the white matter, nonspecific but compatible with chronic microvascular ischemic disease. Small remote lacunar infarcts in the cerebellum bilaterally. Vascular: Major arterial flow voids are maintained at the skull base. Skull and upper cervical spine: Normal marrow signal. Sinuses/Orbits: Moderate paranasal sinus mucosal thickening. Other: No sizable mastoid effusions. IMPRESSION: 1. Small probable acute or subacute infarct in the right frontal cortex and punctate acute infarct in the left frontal cortex 2. Remote bilateral occipital lobe infarcts with evidence of prior hemorrhage on the right. 3. Small remote bilateral cerebellar lacunar infarcts. 4. Moderate to advanced chronic microvascular ischemic disease. 5. Moderate paranasal sinus mucosal thickening. Electronically Signed   By: Margaretha Sheffield M.D.   On: 02/04/2021 18:43   CT HEAD WO  CONTRAST  Result Date: 02/04/2021 CLINICAL DATA:  Dizziness and change in vision. EXAM: CT HEAD WITHOUT CONTRAST TECHNIQUE: Contiguous axial images were obtained from the base of the skull through the vertex without intravenous contrast. COMPARISON:  Brain MRI July 16-2022 FINDINGS: Brain: No evidence of acute infarction, hemorrhage, hydrocephalus, extra-axial collection or mass lesion/mass effect. Area of encephalomalacia in the right  occipital lobe from prior known ischemic infarct. Similar small area of encephalomalacia in the posterior right frontal lobe. Deep white matter microangiopathy. Vascular: No hyperdense vessel or unexpected calcification. Skull: Normal. Negative for fracture or focal lesion. Sinuses/Orbits: Chronic sinusitis of the ethmoid, maxillary and frontal sinuses. Other: None. IMPRESSION: 1. No acute intracranial abnormality. 2. Encephalomalacia in the right occipital lobe from prior known ischemic infarct. 3. Chronic small area of encephalomalacia in the posterior right frontal lobe. 4. Deep white matter microangiopathy. 5. Chronic sinusitis. Electronically Signed   By: Fidela Salisbury M.D.   On: 02/04/2021 15:07    Assessment & Plan:   Lenin was seen today for hypertension.  Diagnoses and all orders for this visit:  GAD (generalized anxiety disorder)- Will start trazodone.  Will increase the dose as needed. -     traZODone (DESYREL) 50 MG tablet; Take 1 tablet (50 mg total) by mouth at bedtime.  Psychophysiological insomnia -     traZODone (DESYREL) 50 MG tablet; Take 1 tablet (50 mg total) by mouth at bedtime.  Essential hypertension- His blood pressure is adequately well controlled.   I am having Gwyndolyn [employer]" start on traZODone. I am also having him maintain his ascorbic acid, acetaminophen, tamsulosin, psyllium, olopatadine, aspirin EC, naproxen sodium, clopidogrel, fluticasone, PreviDent 5000 Plus, omeprazole, losartan, atorvastatin, amLODipine, and  fluocinonide-emollient.  Meds ordered this encounter  Medications   traZODone (DESYREL) 50 MG tablet    Sig: Take 1 tablet (50 mg total) by mouth at bedtime.    Dispense:  90 tablet    Refill:  1     Follow-up: No follow-ups on file.  Scarlette Calico, MD

## 2021-11-14 NOTE — Patient Instructions (Signed)
Insomnia Insomnia is a sleep disorder that makes it difficult to fall asleep or stay asleep. Insomnia can cause fatigue, low energy, difficulty concentrating, mood swings, and poor performance at work or school. There are three different ways to classify insomnia: Difficulty falling asleep. Difficulty staying asleep. Waking up too early in the morning. Any type of insomnia can be long-term (chronic) or short-term (acute). Both are common. Short-term insomnia usually lasts for 3 months or less. Chronic insomnia occurs at least three times a week for longer than 3 months. What are the causes? Insomnia may be caused by another condition, situation, or substance, such as: Having certain mental health conditions, such as anxiety and depression. Using caffeine, alcohol, tobacco, or drugs. Having gastrointestinal conditions, such as gastroesophageal reflux disease (GERD). Having certain medical conditions. These include: Asthma. Alzheimer's disease. Stroke. Chronic pain. An overactive thyroid gland (hyperthyroidism). Other sleep disorders, such as restless legs syndrome and sleep apnea. Menopause. Sometimes, the cause of insomnia may not be known. What increases the risk? Risk factors for insomnia include: Gender. Females are affected more often than males. Age. Insomnia is more common as people get older. Stress and certain medical and mental health conditions. Lack of exercise. Having an irregular work schedule. This may include working night shifts and traveling between different time zones. What are the signs or symptoms? If you have insomnia, the main symptom is having trouble falling asleep or having trouble staying asleep. This may lead to other symptoms, such as: Feeling tired or having low energy. Feeling nervous about going to sleep. Not feeling rested in the morning. Having trouble concentrating. Feeling irritable, anxious, or depressed. How is this diagnosed? This condition  may be diagnosed based on: Your symptoms and medical history. Your health care provider may ask about: Your sleep habits. Any medical conditions you have. Your mental health. A physical exam. How is this treated? Treatment for insomnia depends on the cause. Treatment may focus on treating an underlying condition that is causing the insomnia. Treatment may also include: Medicines to help you sleep. Counseling or therapy. Lifestyle adjustments to help you sleep better. Follow these instructions at home: Eating and drinking  Limit or avoid alcohol, caffeinated beverages, and products that contain nicotine and tobacco, especially close to bedtime. These can disrupt your sleep. Do not eat a large meal or eat spicy foods right before bedtime. This can lead to digestive discomfort that can make it hard for you to sleep. Sleep habits  Keep a sleep diary to help you and your health care provider figure out what could be causing your insomnia. Write down: When you sleep. When you wake up during the night. How well you sleep and how rested you feel the next day. Any side effects of medicines you are taking. What you eat and drink. Make your bedroom a dark, comfortable place where it is easy to fall asleep. Put up shades or blackout curtains to block light from outside. Use a white noise machine to block noise. Keep the temperature cool. Limit screen use before bedtime. This includes: Not watching TV. Not using your smartphone, tablet, or computer. Stick to a routine that includes going to bed and waking up at the same times every day and night. This can help you fall asleep faster. Consider making a quiet activity, such as reading, part of your nighttime routine. Try to avoid taking naps during the day so that you sleep better at night. Get out of bed if you are still awake after   15 minutes of trying to sleep. Keep the lights down, but try reading or doing a quiet activity. When you feel  sleepy, go back to bed. General instructions Take over-the-counter and prescription medicines only as told by your health care provider. Exercise regularly as told by your health care provider. However, avoid exercising in the hours right before bedtime. Use relaxation techniques to manage stress. Ask your health care provider to suggest some techniques that may work well for you. These may include: Breathing exercises. Routines to release muscle tension. Visualizing peaceful scenes. Make sure that you drive carefully. Do not drive if you feel very sleepy. Keep all follow-up visits. This is important. Contact a health care provider if: You are tired throughout the day. You have trouble in your daily routine due to sleepiness. You continue to have sleep problems, or your sleep problems get worse. Get help right away if: You have thoughts about hurting yourself or someone else. Get help right away if you feel like you may hurt yourself or others, or have thoughts about taking your own life. Go to your nearest emergency room or: Call 911. Call the National Suicide Prevention Lifeline at 1-800-273-8255 or 988. This is open 24 hours a day. Text the Crisis Text Line at 741741. Summary Insomnia is a sleep disorder that makes it difficult to fall asleep or stay asleep. Insomnia can be long-term (chronic) or short-term (acute). Treatment for insomnia depends on the cause. Treatment may focus on treating an underlying condition that is causing the insomnia. Keep a sleep diary to help you and your health care provider figure out what could be causing your insomnia. This information is not intended to replace advice given to you by your health care provider. Make sure you discuss any questions you have with your health care provider. Document Revised: 04/23/2021 Document Reviewed: 04/23/2021 Elsevier Patient Education  2023 [employer]  

## 2021-11-14 NOTE — Patient Instructions (Signed)
Good to see you   

## 2021-11-19 ENCOUNTER — Ambulatory Visit (INDEPENDENT_AMBULATORY_CARE_PROVIDER_SITE_OTHER): Payer: Medicare Other | Admitting: *Deleted

## 2021-11-19 DIAGNOSIS — I10 Essential (primary) hypertension: Secondary | ICD-10-CM

## 2021-11-19 DIAGNOSIS — E785 Hyperlipidemia, unspecified: Secondary | ICD-10-CM

## 2021-11-21 ENCOUNTER — Ambulatory Visit: Payer: Medicare Other | Admitting: Podiatry

## 2021-11-22 ENCOUNTER — Ambulatory Visit: Payer: Medicare Other | Admitting: Podiatry

## 2021-11-22 ENCOUNTER — Other Ambulatory Visit: Payer: Self-pay | Admitting: Internal Medicine

## 2021-11-22 DIAGNOSIS — I63423 Cerebral infarction due to embolism of bilateral anterior cerebral arteries: Secondary | ICD-10-CM

## 2021-11-22 DIAGNOSIS — I6300 Cerebral infarction due to thrombosis of unspecified precerebral artery: Secondary | ICD-10-CM

## 2021-11-23 ENCOUNTER — Ambulatory Visit (INDEPENDENT_AMBULATORY_CARE_PROVIDER_SITE_OTHER): Payer: Medicare Other | Admitting: Podiatry

## 2021-11-23 DIAGNOSIS — E785 Hyperlipidemia, unspecified: Secondary | ICD-10-CM

## 2021-11-23 DIAGNOSIS — Q828 Other specified congenital malformations of skin: Secondary | ICD-10-CM

## 2021-11-23 DIAGNOSIS — M7751 Other enthesopathy of right foot: Secondary | ICD-10-CM

## 2021-11-23 DIAGNOSIS — I10 Essential (primary) hypertension: Secondary | ICD-10-CM

## 2021-11-23 DIAGNOSIS — I6300 Cerebral infarction due to thrombosis of unspecified precerebral artery: Secondary | ICD-10-CM | POA: Diagnosis not present

## 2021-11-25 NOTE — Progress Notes (Signed)
Subjective:   Patient ID: Kenneth Montoya, male   DOB: 81 y.o.   MRN: [id number]   HPI Patient presents with concerns about lesions on both feet and then also pain in left heel especially with concern   ROS      Objective:  Physical Exam  Neurovascular status intact muscle strength adequate lesions plantar left x2 with small lucent cores     Assessment:  Appears to be more lesion formation versus inflammatory fasciitis     Plan:  Reviewed both conditions explaining this to him and went ahead today did do debridement of lesions no bleeding noted discussed Planter fasciitis shoe gear modification stretching exercises reappoint as symptoms indicate

## 2021-11-28 ENCOUNTER — Telehealth: Payer: Self-pay | Admitting: Internal Medicine

## 2021-11-28 NOTE — Telephone Encounter (Signed)
Pt called in requesting a telephone number for Reginia Naas. Advised pt he could leave a message for a callback. Pt would like a callback at [phone].

## 2021-12-12 ENCOUNTER — Ambulatory Visit (INDEPENDENT_AMBULATORY_CARE_PROVIDER_SITE_OTHER): Payer: Medicare Other | Admitting: Sports Medicine

## 2021-12-12 VITALS — BP 138/84 | HR 63 | Ht 69.0 in | Wt 197.0 lb

## 2021-12-12 DIAGNOSIS — M7062 Trochanteric bursitis, left hip: Secondary | ICD-10-CM

## 2021-12-12 DIAGNOSIS — M160 Bilateral primary osteoarthritis of hip: Secondary | ICD-10-CM

## 2021-12-12 DIAGNOSIS — M25552 Pain in left hip: Secondary | ICD-10-CM

## 2021-12-12 NOTE — Patient Instructions (Addendum)
Good to see you  Tylenol [phone] mg 2-3 times a day for pain relief  4 week follow up

## 2021-12-12 NOTE — Progress Notes (Signed)
Kenneth Montoya D.[place] Phone: [phone]   Assessment and Plan:     1. Greater trochanteric bursitis of left hip 2. Left hip pain 3. Bilateral primary osteoarthritis of hip -Chronic with exacerbation - History of left hip osteoarthritis with acute flare of left hip pain, most consistent with greater trochanteric bursitis at today's visit - Patient elected for greater trochanteric CSI.  Tolerated well per note below - Start Tylenol 500 to 1000 mg tablets 2-3 times a day for day-to-day pain relief  Procedure: Greater trochanteric bursal injection Side: Left  Risks explained and consent was given verbally. The site was cleaned with alcohol prep. A steroid injection was performed with patient in the lateral side-lying position at area of maximum tenderness over greater trochanter using 70m of 1% lidocaine without epinephrine and 158mof kenalog '40mg'$ /ml. This was well tolerated and resulted in symptomatic relief.  Needle was removed, hemostasis achieved, and post injection instructions were explained.  Pt was advised to call or return to clinic if these symptoms worsen or fail to improve as anticipated.     Pertinent previous records reviewed include none   Follow Up: 4 weeks for reevaluation.  Could consider repeat intra-articular CSI if patient still having pain at that time   Subjective:   I, Kenneth Montoya serving as a scEducation administratoror Doctor BePeter Kiewit SonsChief Complaint: left hip pain   HPI:  I, Kenneth Montoya serving as a scEducation administratoror Doctor BeGlennon Mac Chief Complaint: bilateral hip pain    HPI:    5/18/023 Patient is a [age]ear old male complaining of bilateral hip pain. Patient states that the right hip is more painful states he has bursitis as well, hip pain has been going on for a couple of months that has progressively gotten worse, pain is in the groin and the lateral hip, has  been taking 800 mg 2 times a day of aleve or naproxen 2 acetaminophen 2000 mg daily  , doesn't really have any pain 2 aleve and 4 tylenol , has been working out walking and stretching light lifting    10/23/2021 Patient states that he has want some xray for his spinal stenosis 2017 he had a lumbar something... would like some HEP for strength and stretching    11/14/2021 Patient states that the bursitis is gone   12/12/2021 Patient states left hip is bothering him he is TTP on his lateral hip/ glute area    Relevant Historical Information: Hypertension, history of CVA, GERD    Additional pertinent review of systems negative.   Current Outpatient Medications:    acetaminophen (TYLENOL) 500 MG tablet, Take 1,000 mg by mouth in the morning and at bedtime., Disp: , Rfl:    amLODipine (NORVASC) 10 MG tablet, TAKE 1 TABLET BY MOUTH AT  BEDTIME, Disp: 90 tablet, Rfl: 3   ascorbic acid (VITAMIN C) 1000 MG tablet, Take 1,000 mg by mouth at bedtime., Disp: , Rfl:    aspirin EC 81 MG tablet, Take 81 mg by mouth daily. Swallow whole., Disp: , Rfl:    atorvastatin (LIPITOR) 10 MG tablet, TAKE 1 TABLET(10 MG) BY MOUTH DAILY, Disp: 90 tablet, Rfl: 1   clopidogrel (PLAVIX) 75 MG tablet, TAKE 1 TABLET(75 MG) BY MOUTH DAILY, Disp: 90 tablet, Rfl: 1   fluocinonide-emollient (LIDEX-E) 0.05 % cream, Apply 1 application. topically 2 (two) times daily., Disp: 60 g, Rfl: 0  fluticasone (FLONASE) 50 MCG/ACT nasal spray, PLACE 1 SPRAY IN BOTH  NOSTRILS DAILY, Disp: 32 g, Rfl: 1   losartan (COZAAR) 100 MG tablet, TAKE 1 TABLET BY MOUTH  DAILY, Disp: 90 tablet, Rfl: 1   naproxen sodium (ALEVE) 220 MG tablet, Take 220 mg by mouth daily as needed., Disp: , Rfl:    olopatadine (PATANOL) 0.1 % ophthalmic solution, Place 1 drop into both eyes at bedtime., Disp: , Rfl:    omeprazole (PRILOSEC) 40 MG capsule, TAKE 1 CAPSULE BY MOUTH  DAILY, Disp: 90 capsule, Rfl: 3   PREVIDENT 5000 PLUS 1.1 % CREA dental cream,  SMARTSIG:Application By Mouth, Disp: , Rfl:    psyllium (METAMUCIL) 58.6 % powder, Take 1 packet by mouth at bedtime., Disp: , Rfl:    tamsulosin (FLOMAX) 0.4 MG CAPS capsule, Take 0.4 mg by mouth at bedtime., Disp: , Rfl:    traZODone (DESYREL) 50 MG tablet, Take 1 tablet (50 mg total) by mouth at bedtime., Disp: 90 tablet, Rfl: 1   Objective:     Vitals:   12/12/21 1555  BP: 138/84  Pulse: 63  SpO2: 98%  Weight: 197 lb (89.4 kg)  Height: '5\' 9"'$  (1.753 m)      Body mass index is 29.09 kg/m.    Physical Exam:    General: awake, alert, and oriented no acute distress, nontoxic Skin: no suspicious lesions or rashes Neuro:sensation intact distally with no dificits, normal muscle tone, no atrophy, strength 5/5 in all tested lower ext groups Psych: normal mood and affect, speech clear  Left hip: No deformity, swelling or wasting ROM Flexion 90, ext 20, IR 35, ER 40 TTP greater troch NTTP over the hip flexors, , glute musculature, si joint, lumbar spine Negative log roll Gait normal    Electronically signed by:  Kenneth Montoya D.[facility] 4:24 PM 12/12/21

## 2021-12-15 ENCOUNTER — Other Ambulatory Visit: Payer: Self-pay | Admitting: Internal Medicine

## 2021-12-15 DIAGNOSIS — I10 Essential (primary) hypertension: Secondary | ICD-10-CM

## 2022-01-09 NOTE — Progress Notes (Unsigned)
Kenneth Montoya D.Kenneth Montoya Phone: [phone]   Assessment and Plan:     There are no diagnoses linked to this encounter.  ***   Pertinent previous records reviewed include ***   Follow Up: ***     Subjective:   I, Kenneth Montoya, am serving as a Education administrator for Kenneth Montoya   Chief Complaint: left hip pain     HPI:    5/18/023 Patient is a 81 year old male complaining of bilateral hip pain. Patient states that the right hip is more painful states he has bursitis as well, hip pain has been going on for a couple of months that has progressively gotten worse, pain is in the groin and the lateral hip, has been taking 800 mg 2 times a day of aleve or naproxen 2 acetaminophen 2000 mg daily  , doesn't really have any pain 2 aleve and 4 tylenol , has been working out walking and stretching light lifting    10/23/2021 Patient states that he has want some xray for his spinal stenosis 2017 he had a lumbar something... would like some HEP for strength and stretching    11/14/2021 Patient states that the bursitis is gone    12/12/2021 Patient states left hip is bothering him he is TTP on his lateral hip/ glute area   01/16/2022 Patient states    Relevant Historical Information: Hypertension, history of CVA, GERD Additional pertinent review of systems negative.   Current Outpatient Medications:    acetaminophen (TYLENOL) 500 MG tablet, Take 1,000 mg by mouth in the morning and at bedtime., Disp: , Rfl:    amLODipine (NORVASC) 10 MG tablet, TAKE 1 TABLET BY MOUTH AT  BEDTIME, Disp: 90 tablet, Rfl: 3   ascorbic acid (VITAMIN C) 1000 MG tablet, Take 1,000 mg by mouth at bedtime., Disp: , Rfl:    aspirin EC 81 MG tablet, Take 81 mg by mouth daily. Swallow whole., Disp: , Rfl:    atorvastatin (LIPITOR) 10 MG tablet, TAKE 1 TABLET(10 MG) BY MOUTH DAILY, Disp: 90 tablet, Rfl: 1   clopidogrel (PLAVIX) 75 MG  tablet, TAKE 1 TABLET(75 MG) BY MOUTH DAILY, Disp: 90 tablet, Rfl: 1   fluocinonide-emollient (LIDEX-E) 0.05 % cream, Apply 1 application. topically 2 (two) times daily., Disp: 60 g, Rfl: 0   fluticasone (FLONASE) 50 MCG/ACT nasal spray, PLACE 1 SPRAY IN BOTH  NOSTRILS DAILY, Disp: 32 g, Rfl: 1   losartan (COZAAR) 100 MG tablet, TAKE 1 TABLET BY MOUTH DAILY, Disp: 90 tablet, Rfl: 1   naproxen sodium (ALEVE) 220 MG tablet, Take 220 mg by mouth daily as needed., Disp: , Rfl:    olopatadine (PATANOL) 0.1 % ophthalmic solution, Place 1 drop into both eyes at bedtime., Disp: , Rfl:    omeprazole (PRILOSEC) 40 MG capsule, TAKE 1 CAPSULE BY MOUTH  DAILY, Disp: 90 capsule, Rfl: 3   PREVIDENT 5000 PLUS 1.1 % CREA dental cream, SMARTSIG:Application By Mouth, Disp: , Rfl:    psyllium (METAMUCIL) 58.6 % powder, Take 1 packet by mouth at bedtime., Disp: , Rfl:    tamsulosin (FLOMAX) 0.4 MG CAPS capsule, Take 0.4 mg by mouth at bedtime., Disp: , Rfl:    traZODone (DESYREL) 50 MG tablet, Take 1 tablet (50 mg total) by mouth at bedtime., Disp: 90 tablet, Rfl: 1   Objective:     There were no vitals filed for this visit.    There is  no height or weight on file to calculate BMI.    Physical Exam:    ***   Electronically signed by:  [facility] 9:28 AM 01/09/22

## 2022-01-16 ENCOUNTER — Ambulatory Visit (INDEPENDENT_AMBULATORY_CARE_PROVIDER_SITE_OTHER): Payer: Medicare Other | Admitting: Sports Medicine

## 2022-01-16 VITALS — Ht 69.0 in | Wt 198.0 lb

## 2022-01-16 DIAGNOSIS — M25552 Pain in left hip: Secondary | ICD-10-CM

## 2022-01-16 DIAGNOSIS — M160 Bilateral primary osteoarthritis of hip: Secondary | ICD-10-CM | POA: Diagnosis not present

## 2022-01-16 DIAGNOSIS — M7062 Trochanteric bursitis, left hip: Secondary | ICD-10-CM | POA: Diagnosis not present

## 2022-01-16 NOTE — Patient Instructions (Signed)
Good to see you   

## 2022-01-18 ENCOUNTER — Other Ambulatory Visit: Payer: Self-pay

## 2022-01-18 ENCOUNTER — Inpatient Hospital Stay (HOSPITAL_COMMUNITY)
Admission: EM | Admit: 2022-01-18 | Discharge: 2022-01-20 | DRG: 069 | Disposition: A | Discharge status: Home / Self Care | Payer: Medicare Other | Attending: Internal Medicine | Admitting: Internal Medicine

## 2022-01-18 ENCOUNTER — Encounter (HOSPITAL_COMMUNITY): Payer: Self-pay | Admitting: *Deleted

## 2022-01-18 ENCOUNTER — Observation Stay (HOSPITAL_COMMUNITY): Payer: Medicare Other

## 2022-01-18 ENCOUNTER — Emergency Department (HOSPITAL_COMMUNITY): Payer: Medicare Other

## 2022-01-18 DIAGNOSIS — E871 Hypo-osmolality and hyponatremia: Secondary | ICD-10-CM | POA: Diagnosis not present

## 2022-01-18 DIAGNOSIS — Z8673 Personal history of transient ischemic attack (TIA), and cerebral infarction without residual deficits: Secondary | ICD-10-CM

## 2022-01-18 DIAGNOSIS — Z96653 Presence of artificial knee joint, bilateral: Secondary | ICD-10-CM | POA: Diagnosis present

## 2022-01-18 DIAGNOSIS — Z79899 Other long term (current) drug therapy: Secondary | ICD-10-CM

## 2022-01-18 DIAGNOSIS — M1990 Unspecified osteoarthritis, unspecified site: Secondary | ICD-10-CM | POA: Diagnosis present

## 2022-01-18 DIAGNOSIS — M21379 Foot drop, unspecified foot: Secondary | ICD-10-CM | POA: Diagnosis present

## 2022-01-18 DIAGNOSIS — Z85828 Personal history of other malignant neoplasm of skin: Secondary | ICD-10-CM

## 2022-01-18 DIAGNOSIS — E785 Hyperlipidemia, unspecified: Secondary | ICD-10-CM | POA: Diagnosis present

## 2022-01-18 DIAGNOSIS — M48061 Spinal stenosis, lumbar region without neurogenic claudication: Secondary | ICD-10-CM | POA: Diagnosis present

## 2022-01-18 DIAGNOSIS — R29700 NIHSS score 0: Secondary | ICD-10-CM | POA: Diagnosis present

## 2022-01-18 DIAGNOSIS — K219 Gastro-esophageal reflux disease without esophagitis: Secondary | ICD-10-CM | POA: Diagnosis present

## 2022-01-18 DIAGNOSIS — Z20822 Contact with and (suspected) exposure to covid-19: Secondary | ICD-10-CM | POA: Diagnosis present

## 2022-01-18 DIAGNOSIS — Z96612 Presence of left artificial shoulder joint: Secondary | ICD-10-CM | POA: Diagnosis present

## 2022-01-18 DIAGNOSIS — E222 Syndrome of inappropriate secretion of antidiuretic hormone: Secondary | ICD-10-CM | POA: Diagnosis not present

## 2022-01-18 DIAGNOSIS — G459 Transient cerebral ischemic attack, unspecified: Principal | ICD-10-CM

## 2022-01-18 DIAGNOSIS — Z8616 Personal history of COVID-19: Secondary | ICD-10-CM

## 2022-01-18 DIAGNOSIS — I10 Essential (primary) hypertension: Secondary | ICD-10-CM | POA: Diagnosis present

## 2022-01-18 DIAGNOSIS — Z96611 Presence of right artificial shoulder joint: Secondary | ICD-10-CM | POA: Diagnosis present

## 2022-01-18 DIAGNOSIS — I452 Bifascicular block: Secondary | ICD-10-CM | POA: Diagnosis present

## 2022-01-18 DIAGNOSIS — N400 Enlarged prostate without lower urinary tract symptoms: Secondary | ICD-10-CM | POA: Diagnosis present

## 2022-01-18 DIAGNOSIS — Z7902 Long term (current) use of antithrombotics/antiplatelets: Secondary | ICD-10-CM

## 2022-01-18 LAB — COMPREHENSIVE METABOLIC PANEL
ALT: 18 U/L (ref 0–44)
AST: 22 U/L (ref 15–41)
Albumin: 4 g/dL (ref 3.5–5.0)
Alkaline Phosphatase: 87 U/L (ref 38–126)
Anion gap: 11 (ref 5–15)
BUN: 17 mg/dL (ref 8–23)
CO2: 24 mmol/L (ref 22–32)
Calcium: 9 mg/dL (ref 8.9–10.3)
Chloride: 95 mmol/L — ABNORMAL LOW (ref 98–111)
Creatinine, Ser: 1.11 mg/dL (ref 0.61–1.24)
GFR, Estimated: >60 mL/min (ref >60)
Glucose, Bld: 131 mg/dL — ABNORMAL HIGH (ref 70–99)
Potassium: 4.2 mmol/L (ref 3.5–5.1)
Sodium: 130 mmol/L — ABNORMAL LOW (ref 135–145)
Total Bilirubin: 0.8 mg/dL (ref 0.3–1.2)
Total Protein: 6.4 g/dL — ABNORMAL LOW (ref 6.5–8.1)

## 2022-01-18 LAB — I-STAT CHEM 8, ED
BUN: 20 mg/dL (ref 8–23)
Calcium, Ion: 1.02 mmol/L — ABNORMAL LOW (ref 1.15–1.40)
Chloride: 94 mmol/L — ABNORMAL LOW (ref 98–111)
Creatinine, Ser: 1 mg/dL (ref 0.61–1.24)
Glucose, Bld: 129 mg/dL — ABNORMAL HIGH (ref 70–99)
HCT: 42 % (ref 39.0–52.0)
Hemoglobin: 14.3 g/dL (ref 13.0–17.0)
Potassium: 4.1 mmol/L (ref 3.5–5.1)
Sodium: 129 mmol/L — ABNORMAL LOW (ref 135–145)
TCO2: 25 mmol/L (ref 22–32)

## 2022-01-18 LAB — URINALYSIS, ROUTINE W REFLEX MICROSCOPIC
Bacteria, UA: NONE SEEN
Bilirubin Urine: NEGATIVE
Glucose, UA: NEGATIVE mg/dL
Hgb urine dipstick: NEGATIVE
Ketones, ur: NEGATIVE mg/dL
Nitrite: NEGATIVE
Protein, ur: NEGATIVE mg/dL
Specific Gravity, Urine: 1.015 (ref 1.005–1.030)
pH: 5 (ref 5.0–8.0)

## 2022-01-18 LAB — DIFFERENTIAL
Abs Immature Granulocytes: 0.09 10*3/uL — ABNORMAL HIGH (ref 0.00–0.07)
Basophils Absolute: 0.1 10*3/uL (ref 0.0–0.1)
Basophils Relative: 1 %
Eosinophils Absolute: 0.2 10*3/uL (ref 0.0–0.5)
Eosinophils Relative: 2 %
Immature Granulocytes: 1 %
Lymphocytes Relative: 17 %
Lymphs Abs: 1.4 10*3/uL (ref 0.7–4.0)
Monocytes Absolute: 0.6 10*3/uL (ref 0.1–1.0)
Monocytes Relative: 7 %
Neutro Abs: 5.8 10*3/uL (ref 1.7–7.7)
Neutrophils Relative %: 72 %

## 2022-01-18 LAB — CBC
HCT: 39.3 % (ref 39.0–52.0)
Hemoglobin: 13.8 g/dL (ref 13.0–17.0)
MCH: 32.9 pg (ref 26.0–34.0)
MCHC: 35.1 g/dL (ref 30.0–36.0)
MCV: 93.6 fL (ref 80.0–100.0)
Platelets: 223 10*3/uL (ref 150–400)
RBC: 4.2 MIL/uL — ABNORMAL LOW (ref 4.22–5.81)
RDW: 11.7 % (ref 11.5–15.5)
WBC: 8.2 10*3/uL (ref 4.0–10.5)
nRBC: 0 % (ref 0.0–0.2)

## 2022-01-18 LAB — HEMOGLOBIN A1C
Hgb A1c MFr Bld: 5.4 % (ref 4.8–5.6)
Mean Plasma Glucose: 108.28 mg/dL

## 2022-01-18 LAB — ETHANOL: Alcohol, Ethyl (B): <10 mg/dL (ref <10)

## 2022-01-18 LAB — RAPID URINE DRUG SCREEN, HOSP PERFORMED
Amphetamines: NOT DETECTED
Barbiturates: NOT DETECTED
Benzodiazepines: NOT DETECTED
Cocaine: NOT DETECTED
Opiates: NOT DETECTED
Tetrahydrocannabinol: NOT DETECTED

## 2022-01-18 LAB — PROTIME-INR
INR: 1 (ref 0.8–1.2)
Prothrombin Time: 12.9 seconds (ref 11.4–15.2)

## 2022-01-18 LAB — RESP PANEL BY RT-PCR (FLU A&B, COVID) ARPGX2
Influenza A by PCR: NEGATIVE
Influenza B by PCR: NEGATIVE
SARS Coronavirus 2 by RT PCR: NEGATIVE

## 2022-01-18 LAB — CBG MONITORING, ED: Glucose-Capillary: 124 mg/dL — ABNORMAL HIGH (ref 70–99)

## 2022-01-18 LAB — APTT: aPTT: 30 seconds (ref 24–36)

## 2022-01-18 MED ORDER — ACETAMINOPHEN 160 MG/5ML PO SOLN: 650.0000 mg | ORAL | Status: DC | PRN

## 2022-01-18 MED ORDER — FLUTICASONE PROPIONATE 50 MCG/ACT NA SUSP
1.0000 | Freq: Every day | NASAL | Status: DC
  Filled 2022-01-18: qty 16

## 2022-01-18 MED ORDER — TAMSULOSIN HCL 0.4 MG PO CAPS
0.4000 mg | ORAL_CAPSULE | Freq: Every day | ORAL | Status: DC
  Administered 2022-01-19 (×2): 0.4 mg via ORAL
  Filled 2022-01-18 (×2): qty 1

## 2022-01-18 MED ORDER — ENOXAPARIN SODIUM 40 MG/0.4ML IJ SOSY
40.0000 mg | PREFILLED_SYRINGE | INTRAMUSCULAR | Status: DC
  Administered 2022-01-18 – 2022-01-19 (×2): 40 mg via SUBCUTANEOUS
  Filled 2022-01-18 (×2): qty 0.4

## 2022-01-18 MED ORDER — OLOPATADINE HCL 0.1 % OP SOLN
1.0000 [drp] | Freq: Every day | OPHTHALMIC | Status: DC
Start: 2022-01-18 — End: 2022-01-20
  Filled 2022-01-18: qty 5

## 2022-01-18 MED ORDER — IOHEXOL 350 MG/ML SOLN
75.0000 mL | Freq: Once | INTRAVENOUS | Status: AC | PRN
  Administered 2022-01-18: 75 mL via INTRAVENOUS

## 2022-01-18 MED ORDER — PANTOPRAZOLE SODIUM 40 MG PO TBEC
80.0000 mg | DELAYED_RELEASE_TABLET | Freq: Every day | ORAL | Status: DC
  Administered 2022-01-19 – 2022-01-20 (×2): 80 mg via ORAL
  Filled 2022-01-18 (×2): qty 2

## 2022-01-18 MED ORDER — ACETAMINOPHEN 500 MG PO TABS
1000.0000 mg | ORAL_TABLET | Freq: Two times a day (BID) | ORAL | Status: DC
  Administered 2022-01-19 – 2022-01-20 (×4): 1000 mg via ORAL
  Filled 2022-01-18 (×4): qty 2

## 2022-01-18 MED ORDER — ACETAMINOPHEN 650 MG RE SUPP: 650.0000 mg | RECTAL | Status: DC | PRN

## 2022-01-18 MED ORDER — NAPROXEN SODIUM 275 MG PO TABS
275.0000 mg | ORAL_TABLET | Freq: Two times a day (BID) | ORAL | Status: DC
Start: 2022-01-19 — End: 2022-01-19
  Filled 2022-01-18: qty 1

## 2022-01-18 MED ORDER — CLOPIDOGREL BISULFATE 75 MG PO TABS
75.0000 mg | ORAL_TABLET | Freq: Every day | ORAL | Status: DC
  Administered 2022-01-19 – 2022-01-20 (×2): 75 mg via ORAL
  Filled 2022-01-18 (×2): qty 1

## 2022-01-18 MED ORDER — STROKE: EARLY STAGES OF RECOVERY BOOK
Freq: Once | Status: AC
Start: 2022-01-19 — End: 2022-01-19
  Filled 2022-01-18: qty 1

## 2022-01-18 MED ORDER — LORATADINE 10 MG PO TABS
10.0000 mg | ORAL_TABLET | Freq: Every day | ORAL | Status: DC
  Administered 2022-01-19 – 2022-01-20 (×2): 10 mg via ORAL
  Filled 2022-01-18 (×2): qty 1

## 2022-01-18 MED ORDER — ACETAMINOPHEN 325 MG PO TABS
650.0000 mg | ORAL_TABLET | ORAL | Status: DC | PRN
  Administered 2022-01-20: 650 mg via ORAL
  Filled 2022-01-18: qty 2

## 2022-01-18 MED ORDER — ATORVASTATIN CALCIUM 10 MG PO TABS
10.0000 mg | ORAL_TABLET | Freq: Every day | ORAL | Status: DC
  Administered 2022-01-19 – 2022-01-20 (×2): 10 mg via ORAL
  Filled 2022-01-18 (×2): qty 1

## 2022-01-18 MED ORDER — LORAZEPAM 2 MG/ML IJ SOLN
0.5000 mg | Freq: Once | INTRAMUSCULAR | Status: AC | PRN
  Administered 2022-01-19: 0.5 mg via INTRAVENOUS
  Filled 2022-01-18: qty 1

## 2022-01-18 NOTE — Assessment & Plan Note (Signed)
Presented with symptoms of right lower extremity weakness and paresthesia around 6:30 PM tonight with complete resolution of symptoms now. -Has history of CVA in 12/13/2020 and 02/13/2021.  Stroke in 11/2018 correlated with COVID positivity with question of potential hypercoagulability resulting in stroke.  However, distribution of stroke in September appears to be embolic but patient has refused 30-day cardiac monitoring on multiple occasions..  Currently on monotherapy with Plavix.  Appreciate cardiology recommendation on antiplatelet therapy at this time. -CT head shows new but remote inferior left cerebellar and left superior cerebellar infarcts.  Stable remote infarcts of the right frontal lobe and bilateral occipital lobe. -Obtain MRI brain -Obtain CTA head and neck - echocardiogram  - start daily aspirin and atorvastatin -Obtain A1c and lipids -PT/OT/SLT -Frequent neuro checks and keep on telemetry -Allow for permissive hypertension with blood pressure treatment as needed only if systolic goes above 425

## 2022-01-18 NOTE — Code Documentation (Signed)
Stroke Response Nurse Documentation Code Documentation  JASIRI HANAWALT is a 81 y.o. male arriving to [facility]  via [employer] on 8/25 with past medical hx of CVA, HTN, HLD. On aspirin 81 mg daily and clopidogrel 75 mg daily. Code stroke was activated by ED.   Patient from home where he was LKW at 1900 and now complaining of right leg weakness.   Stroke team at the bedside on patient arrival. Labs drawn and patient cleared for CT by Dr. Mayra Neer. Patient to CT with team. NIHSS 0, see documentation for details and code stroke times. Patient with no deficits on exam. The following imaging was completed:  CT Head. Patient is not a candidate for IV Thrombolytic due to resolving symptoms. Patient is not a candidate for IR due to resolving symptoms.   Care Plan: TIA alert.   Bedside handoff with ED RN Bryson Ha.    Madelynn Done  Rapid Response RN

## 2022-01-18 NOTE — ED Notes (Signed)
Patient transported to CT 

## 2022-01-18 NOTE — ED Provider Triage Note (Signed)
Emergency Medicine Provider Triage Evaluation Note  Kenneth Montoya , a 81 y.o. male  was evaluated in triage.  Pt complains of numbness and inability to move his right leg 1 hour ago. Notes that his symptoms resolved within 1 minute and he was able to move his leg again. No blood thinners. Denies headache, blurred vision, double vision.  Patient notes that he has a heaviness to his right leg.  Notes he has a history of a stroke last year.  Review of Systems  Positive: As per HPI Negative:   Physical Exam  BP (!) 140/84 (BP Location: Right Arm)   Pulse 86   Temp 98 F (36.7 C) (Oral)   Resp 18   SpO2 96%  Gen:   Awake, no distress   Resp:  Normal effort  MSK:   Moves extremities without difficulty  Other:  Negative pronator drift.  No focal neurological deficit.  Patient able to ambulate without assistance or difficulty.  Strength and sensation intact to bilateral upper and lower extremities.  Grip strength 5/5 bilaterally.  Medical Decision Making  Medically screening exam initiated at 8:19 PM.  Appropriate orders placed.  [name] was informed that the remainder of the evaluation will be completed by another provider, this initial triage assessment does not replace that evaluation, and the importance of remaining in the ED until their evaluation is complete.  8:26 PM - Attending in to evaluate the patient. Attending requested code stroke to be called at this time.    Chinara Hertzberg A, PA-C 01/18/22 2039

## 2022-01-18 NOTE — H&P (Signed)
History and Physical    Patient: Kenneth Montoya VVO:[id number] DOB: 07-18-1940 DOA: 01/18/2022 DOS: the patient was seen and examined on 01/18/2022 PCP: Janith Lima, MD  Patient coming from: Home  Chief Complaint:  Chief Complaint  Patient presents with   Numbness   Code Stroke   HPI: Kenneth Montoya is a 81 y.o. male with medical history significant of CVA, hypertension, GERD, SIADH, hyperlipidemia and GAD who presents with right leg numbness and heaviness.  Pt was driving around 2:69SW and felt acute heaviness and numbness of the right foot and could barely take his foot off the gas pedal. Had resolution of symptoms about 30 seconds later.  Denies any headache, blurry vision, chest pain or palpitation.    He has history of previous CVA in 11/2020 when he presented with transient left-sided vision loss.  Also positive for COVID.  MRI of the brain showed acute right medial occipital lobe infarct at that time.  Echocardiogram was never performed as patient desired to be discharged.  He was discharged on dual antiplatelet therapy for 21 days followed by aspirin alone and started on atorvastatin.  However it was documented by neurology that he subsequently discontinue atorvastatin because of dizziness and weakness.  Outpatient 30-day cardiac monitor was ordered but he declined. He then followed up output with neurology in 01/2021 and agreeable to 30 day cardiac monitoring but shortly after readmitted for recurrent stroke with MRI brain showing infarct of right frontal cortex, punctate acute infarct in the left frontal cortex.  He was then advised to do DAPT for 3 weeks and then Plavix alone.  Does not appear to have followed up with Holter monitoring.    In the ED, he presented as a code stroke CT head shows new but remote inferior left cerebellar and left superior cerebellar infarcts.  Stable remote infarcts of the right frontal lobe and bilateral occipital lobe. No leukocytosis or anemia.   Hyponatremia with sodium at 129 which is close to prior from 128-131.  UA is negative.  UDS is negative.  Alcohol level of less than 10. EKG on my review with RBBB and LAFB.  Patient has been evaluated by neurology.  Hospitalist then consulted for admission.  Review of Systems: As mentioned in the history of present illness. All other systems reviewed and are negative. Past Medical History:  Diagnosis Date   Arthritis    generalized   BPH (benign prostatic hyperplasia)    Cancer of skin, squamous cell    frozen    Cataract    bilateral sx   GERD (gastroesophageal reflux disease)    on meds   Hypertension    on meds   Shingles    Stroke [facility])    Past Surgical History:  Procedure Laterality Date   CATARACT EXTRACTION, BILATERAL     COLONOSCOPY  06/27/2020   COLONOSCOPY  2018   gum graft     INGUINAL HERNIA REPAIR     as infant   LUMBAR LAMINECTOMY  06/2015   POLYPECTOMY  2018   TA x 3   REVERSE TOTAL SHOULDER ARTHROPLASTY Bilateral    TONSILLECTOMY AND ADENOIDECTOMY     age 85    TOTAL KNEE ARTHROPLASTY Bilateral    TOTAL KNEE REVISION Right 12/25/2017   TRANSURETHRAL RESECTION OF PROSTATE     x 2   UPPER GASTROINTESTINAL ENDOSCOPY     Social History:  reports that he has never smoked. He has never used smokeless tobacco. He reports current  alcohol use of about 2.0 standard drinks of alcohol per week. He reports that he does not use drugs.  No Known Allergies  Family History  Problem Relation Age of Onset   Colon polyps Mother 63   Stroke Maternal Grandfather    Colon cancer Neg Hx    Esophageal cancer Neg Hx    Rectal cancer Neg Hx    Stomach cancer Neg Hx     Prior to Admission medications   Medication Sig Start Date End Date Taking? Authorizing Provider  amLODipine (NORVASC) 10 MG tablet TAKE 1 TABLET BY MOUTH AT  BEDTIME Patient taking differently: Take 10 mg by mouth daily. 08/03/21  Yes Janith Lima, MD  atorvastatin (LIPITOR) 10 MG tablet TAKE 1  TABLET(10 MG) BY MOUTH DAILY Patient taking differently: Take 10 mg by mouth daily. 07/30/21  Yes Janith Lima, MD  clopidogrel (PLAVIX) 75 MG tablet TAKE 1 TABLET(75 MG) BY MOUTH DAILY Patient taking differently: Take 75 mg by mouth daily. 11/22/21  Yes Janith Lima, MD  naproxen sodium (ALEVE) 220 MG tablet Take 440 mg by mouth 2 (two) times daily with a meal.   Yes [provider]  tamsulosin (FLOMAX) 0.4 MG CAPS capsule Take 0.4 mg by mouth at bedtime. 09/17/19  Yes [provider]  acetaminophen (TYLENOL) 500 MG tablet Take 1,000 mg by mouth in the morning and at bedtime.    [provider]  ascorbic acid (VITAMIN C) 1000 MG tablet Take 1,000 mg by mouth at bedtime.    [provider]  aspirin EC 81 MG tablet Take 81 mg by mouth daily. Swallow whole.    [provider]  fluocinonide-emollient (LIDEX-E) 0.05 % cream Apply 1 application. topically 2 (two) times daily. 10/09/21   Janith Lima, MD  fluticasone (FLONASE) 50 MCG/ACT nasal spray PLACE 1 SPRAY IN BOTH  NOSTRILS DAILY Patient taking differently: Place 1 spray into both nostrils daily. 05/31/21   Janith Lima, MD  losartan (COZAAR) 100 MG tablet TAKE 1 TABLET BY MOUTH DAILY Patient taking differently: Take 100 mg by mouth daily. 12/16/21   Janith Lima, MD  olopatadine (PATANOL) 0.1 % ophthalmic solution Place 1 drop into both eyes at bedtime.    [provider]  omeprazole (PRILOSEC) 40 MG capsule TAKE 1 CAPSULE BY MOUTH  DAILY Patient taking differently: Take 40 mg by mouth daily. TAKE 1 CAPSULE BY MOUTH  DAILY 06/29/21   Janith Lima, MD  PREVIDENT 5000 PLUS 1.1 % CREA dental cream SMARTSIG:Application By Mouth 60/1/09   [provider]  psyllium (METAMUCIL) 58.6 % powder Take 1 packet by mouth at bedtime.    [provider]  traZODone (DESYREL) 50 MG tablet Take 1 tablet (50 mg total) by mouth at bedtime. 11/14/21   Janith Lima, MD    Physical  Exam: Vitals:   01/18/22 2145 01/18/22 2200 01/18/22 2215 01/18/22 2230  BP: (!) 166/79 (!) 139/91    Pulse: 74 81 81 82  Resp: '20 19 20 15  '$ Temp:      TempSrc:      SpO2: 98% 99% 98% 98%  Weight:      Height:       Constitutional: NAD, calm, comfortable, elderly gentleman sitting upright in bed Eyes:  lids and conjunctivae normal ENMT: Mucous membranes are moist.  Neck: normal, supple Respiratory: clear to auscultation bilaterally, no wheezing, no crackles. Normal respiratory effort. No accessory muscle use.  Cardiovascular: Regular rate  and rhythm, no murmurs / rubs / gallops. No extremity edema. 2+ pedal pulses.  Abdomen: no tenderness, no masses palpated. Bowel sounds positive.  Musculoskeletal: no clubbing / cyanosis. No joint deformity upper and lower extremities.  Right lower extremity was slightly cooler to touch compared to left but has strong +2 dorsalis pedis pulse.  He also denies history of claudication symptoms. Skin: no rashes, lesions, ulcers.  Neurologic: CN 2-12 grossly intact.  No facial asymmetry, equal smile and shoulder shrug.  Intact heel-to-shin.  Sensation intact, Strength 5/5 in all 4.  Psychiatric: Normal judgment and insight. Alert and oriented x 3. Normal mood. Data Reviewed:  See HPI  Assessment and Plan: * TIA (transient ischemic attack) Presented with symptoms of right lower extremity weakness and paresthesia around 6:30 PM tonight with complete resolution of symptoms now. -Has history of CVA in 12/13/2020 and 02/13/2021.  Stroke in 11/2018 correlated with COVID positivity with question of potential hypercoagulability resulting in stroke.  However, distribution of stroke in September appears to be embolic but patient has refused 30-day cardiac monitoring on multiple occasions..  Currently on monotherapy with Plavix.  Appreciate cardiology recommendation on antiplatelet therapy at this time. -CT head shows new but remote inferior left cerebellar and left  superior cerebellar infarcts.  Stable remote infarcts of the right frontal lobe and bilateral occipital lobe. -Obtain MRI brain -Obtain CTA head and neck - echocardiogram  - start daily aspirin and atorvastatin -Obtain A1c and lipids -PT/OT/SLT -Frequent neuro checks and keep on telemetry -Allow for permissive hypertension with blood pressure treatment as needed only if systolic goes above 782  Hyponatremia - Presented with sodium of 129 with history of SIADH.  Only has occasional alcohol use.  Baseline sodium ranges from 129-131. -Continue to monitor with morning labs  Hyperlipidemia with target LDL less than 100 Continue statin  Essential hypertension Hold antihypertensives and allow for permissive hypertension at this time      Advance Care Planning:   Code Status: Full Code full  Consults: Neuro  Family Communication: No family bedside  Severity of Illness: The appropriate patient status for this patient is OBSERVATION. Observation status is judged to be reasonable and necessary in order to provide the required intensity of service to ensure the patient's safety. The patient's presenting symptoms, physical exam findings, and initial radiographic and laboratory data in the context of their medical condition is felt to place them at decreased risk for further clinical deterioration. Furthermore, it is anticipated that the patient will be medically stable for discharge from the hospital within 2 midnights of admission.   Author: Orene Desanctis, DO 01/18/2022 11:11 PM  For on call review [email or web address].

## 2022-01-18 NOTE — Assessment & Plan Note (Signed)
Continue statin. 

## 2022-01-18 NOTE — Consult Note (Incomplete)
Neurology Consultation Reason for Consult: Right lower extremity weakness Requesting Physician: Cindee Lame   CC: Right leg weakness  History is obtained from: Patient and chart review  HPI: Kenneth Montoya is a 81 y.o. male with a past medical history significant for multiple prior embolic appearing strokes without residual, hypertension, hyperlipidemia  He reports he was driving when suddenly he could no longer control his right foot and moving easily off of the accelerator pedal.  He presented to the ED for further evaluation at which time his symptoms were rapidly resolving.  By the time CT scan was completed he was ambulating and had no deficits on my full neurological evaluation.  On review of his prior MRI brain from July 2022 as well as September 2022 he has multifocal embolic appearing strokes, for which he has not followed up for cardiac monitoring  LKW: 7 PM tPA given?: No, symptoms resolved IA performed?: No, symptoms resolved Premorbid modified rankin scale:      0 - No symptoms.  ROS: Negative except as noted in the HPI.   Past Medical History:  Diagnosis Date   Arthritis    generalized   BPH (benign prostatic hyperplasia)    Cancer of skin, squamous cell    frozen    Cataract    bilateral sx   GERD (gastroesophageal reflux disease)    on meds   Hypertension    on meds   Shingles    Stroke [facility])    Past Surgical History:  Procedure Laterality Date   CATARACT EXTRACTION, BILATERAL     COLONOSCOPY  06/27/2020   COLONOSCOPY  2018   gum graft     INGUINAL HERNIA REPAIR     as infant   LUMBAR LAMINECTOMY  06/2015   POLYPECTOMY  2018   TA x 3   REVERSE TOTAL SHOULDER ARTHROPLASTY Bilateral    TONSILLECTOMY AND ADENOIDECTOMY     age 76    TOTAL KNEE ARTHROPLASTY Bilateral    TOTAL KNEE REVISION Right 12/25/2017   TRANSURETHRAL RESECTION OF PROSTATE     x 2   UPPER GASTROINTESTINAL ENDOSCOPY     Current Outpatient Medications  Medication Instructions    acetaminophen (TYLENOL) 1,000 mg, Oral, 2 times daily   amLODipine (NORVASC) 10 MG tablet TAKE 1 TABLET BY MOUTH AT  BEDTIME   ascorbic acid (VITAMIN C) 1,000 mg, Oral, Daily at bedtime   atorvastatin (LIPITOR) 10 MG tablet TAKE 1 TABLET(10 MG) BY MOUTH DAILY   clopidogrel (PLAVIX) 75 MG tablet TAKE 1 TABLET(75 MG) BY MOUTH DAILY   fluocinonide-emollient (LIDEX-E) 7.16 % cream 1 application , Topical, 2 times daily   fluticasone (FLONASE) 50 MCG/ACT nasal spray PLACE 1 SPRAY IN BOTH  NOSTRILS DAILY   loratadine (CLARITIN) 10 mg, Oral, Daily   losartan (COZAAR) 100 MG tablet TAKE 1 TABLET BY MOUTH DAILY   naproxen sodium (ALEVE) 440 mg, Oral, 2 times daily with meals   olopatadine (PATANOL) 0.1 % ophthalmic solution 1 drop, Both Eyes, Daily at bedtime   omeprazole (PRILOSEC) 40 MG capsule TAKE 1 CAPSULE BY MOUTH  DAILY   PREVIDENT 5000 PLUS 1.1 % CREA dental cream SMARTSIG:Application By Mouth   psyllium (METAMUCIL) 58.6 % powder 1 packet, Oral, Daily at bedtime   tamsulosin (FLOMAX) 0.4 mg, Oral, Daily at bedtime   traZODone (DESYREL) 50 mg, Oral, Daily at bedtime     Family History  Problem Relation Age of Onset   Colon polyps Mother 69   Stroke Maternal  Grandfather    Colon cancer Neg Hx    Esophageal cancer Neg Hx    Rectal cancer Neg Hx    Stomach cancer Neg Hx     Social History:  reports that he has never smoked. He has never used smokeless tobacco. He reports current alcohol use of about 2.0 standard drinks of alcohol per week. He reports that he does not use drugs.   Exam: Current vital signs: BP (!) 140/84 (BP Location: Right Arm)   Pulse 86   Temp 98 F (36.7 C) (Oral)   Resp 18   SpO2 96%  Vital signs in last 24 hours: Temp:  [98 F (36.7 C)] 98 F (36.7 C) (08/25 2013) Pulse Rate:  [86] 86 (08/25 2013) Resp:  [18] 18 (08/25 2013) BP: (140)/(84) 140/84 (08/25 2013) SpO2:  [96 %] 96 % (08/25 2013)   Physical Exam  Constitutional: Appears well-developed  and well-nourished.  Psych: Affect witty, cooperative Eyes: No scleral injection HENT: No oropharyngeal obstruction.  MSK: no joint deformities.  Cardiovascular: Normal rate and regular rhythm. Perfusing extremities well Respiratory: Effort normal, non-labored breathing GI: Soft.  No distension. There is no tenderness.  Skin: Warm dry and intact visible skin  Neuro: Mental Status: Patient is awake, alert, oriented to person, place, month, year, and situation. Patient is able to give a clear and coherent history. No signs of aphasia or neglect Cranial Nerves: II: Visual Fields are full. Pupils are equal, round, and reactive to light.   III,IV, VI: EOMI without ptosis or diploplia.  V: Facial sensation is symmetric to temperature VII: Facial movement is symmetric.  VIII: hearing is intact to voice X: Uvula elevates symmetrically XI: Shoulder shrug is symmetric. XII: tongue is midline without atrophy or fasciculations.  Motor: Tone is normal. Bulk is normal. 5/5 strength was present in all four extremities, except 4/5 bilateral deltoids.  Limited range of motion of bilateral shoulders preventing full supination, but no drift Sensory: Sensation is symmetric to light touch and temperature in the arms and legs. Deep Tendon Reflexes: 2+ and symmetric in the brachioradialis and patellae.  Cerebellar: FNF and HKS are intact bilaterally Gait:  Ambulating normally, able to rise on heels and toes  NIHSS total 0 Performed at 20:48   I have reviewed labs in epic and the results pertinent to this consultation are:  Basic Metabolic Panel: Recent Labs  Lab 01/18/22 2039 01/18/22 2057  NA 130* 129*  K 4.2 4.1  CL 95* 94*  CO2 24  --   GLUCOSE 131* 129*  BUN 17 20  CREATININE 1.11 1.00  CALCIUM 9.0  --     CBC: Recent Labs  Lab 01/18/22 2039 01/18/22 2057  WBC 8.2  --   NEUTROABS 5.8  --   HGB 13.8 14.3  HCT 39.3 42.0  MCV 93.6  --   PLT 223  --     Coagulation  Studies: Recent Labs    01/18/22 2039  LABPROT 12.9  INR 1.0      I have reviewed the images obtained: Head CT personally reviewed, negative for acute intracranial process   Impression: Given his history I am highly concerned for occult atrial fibrillation as the etiology of this acute neurological event  Recommendations: # Stroke versus TIA - Stroke labs TSH, HgbA1c, fasting lipid panel - MRI brain without contrast - CTA head and neck - Frequent neuro checks - Echocardiogram - Given twice daily NSAID use, will hold off on aspirin - Continue home  Plavix - Risk factor modification, diet exercise and weight loss counseling - Telemetry monitoring; 30 day event monitor on discharge if no arrythmias captured or consider loop recorder - Blood pressure goal   - Permissive hypertension to 220/120 until MRI results, if negative for acute stroke may target normotension - PT consult, OT consult, Speech consult, unless patient is back to baseline - Stroke team to follow  [place] [phone] Available 7 AM to 7 PM, outside these hours please contact Neurologist on call listed on AMION    Total critical care time: 30 minutes   Critical care time was exclusive of separately billable procedures and treating other patients.   Critical care was necessary to treat or prevent imminent or life-threatening deterioration, acute change in neurological status with consideration of treatment with TNK or thrombectomy.   Critical care was time spent personally by me on the following activities: development of treatment plan with patient and/or surrogate as well as nursing, discussions with consultants/primary team, evaluation of patient's response to treatment, examination of patient, obtaining history from patient or surrogate, ordering and performing treatments and interventions, ordering and review of laboratory studies, ordering and review of radiographic  studies, and re-evaluation of patient's condition as needed, as documented above.
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
## 2022-01-18 NOTE — Assessment & Plan Note (Signed)
-   Presented with sodium of 129 with history of SIADH.  Only has occasional alcohol use.  Baseline sodium ranges from 129-131. -Continue to monitor with morning labs

## 2022-01-18 NOTE — Assessment & Plan Note (Signed)
Hold antihypertensives and allow for permissive hypertension at this time
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
## 2022-01-19 ENCOUNTER — Observation Stay (HOSPITAL_COMMUNITY): Payer: Medicare Other

## 2022-01-19 ENCOUNTER — Inpatient Hospital Stay (HOSPITAL_COMMUNITY): Payer: Medicare Other

## 2022-01-19 DIAGNOSIS — E222 Syndrome of inappropriate secretion of antidiuretic hormone: Secondary | ICD-10-CM | POA: Diagnosis present

## 2022-01-19 DIAGNOSIS — N400 Enlarged prostate without lower urinary tract symptoms: Secondary | ICD-10-CM | POA: Diagnosis present

## 2022-01-19 DIAGNOSIS — Z8673 Personal history of transient ischemic attack (TIA), and cerebral infarction without residual deficits: Secondary | ICD-10-CM | POA: Diagnosis not present

## 2022-01-19 DIAGNOSIS — E871 Hypo-osmolality and hyponatremia: Secondary | ICD-10-CM | POA: Diagnosis not present

## 2022-01-19 DIAGNOSIS — Z8616 Personal history of COVID-19: Secondary | ICD-10-CM | POA: Diagnosis not present

## 2022-01-19 DIAGNOSIS — E785 Hyperlipidemia, unspecified: Secondary | ICD-10-CM | POA: Diagnosis present

## 2022-01-19 DIAGNOSIS — G459 Transient cerebral ischemic attack, unspecified: Secondary | ICD-10-CM | POA: Diagnosis present

## 2022-01-19 DIAGNOSIS — Z79899 Other long term (current) drug therapy: Secondary | ICD-10-CM | POA: Diagnosis not present

## 2022-01-19 DIAGNOSIS — I452 Bifascicular block: Secondary | ICD-10-CM | POA: Diagnosis present

## 2022-01-19 DIAGNOSIS — Z85828 Personal history of other malignant neoplasm of skin: Secondary | ICD-10-CM | POA: Diagnosis not present

## 2022-01-19 DIAGNOSIS — I10 Essential (primary) hypertension: Secondary | ICD-10-CM | POA: Diagnosis present

## 2022-01-19 DIAGNOSIS — K219 Gastro-esophageal reflux disease without esophagitis: Secondary | ICD-10-CM | POA: Diagnosis present

## 2022-01-19 DIAGNOSIS — Z96612 Presence of left artificial shoulder joint: Secondary | ICD-10-CM | POA: Diagnosis present

## 2022-01-19 DIAGNOSIS — Z96653 Presence of artificial knee joint, bilateral: Secondary | ICD-10-CM | POA: Diagnosis present

## 2022-01-19 DIAGNOSIS — R29700 NIHSS score 0: Secondary | ICD-10-CM | POA: Diagnosis present

## 2022-01-19 DIAGNOSIS — M21379 Foot drop, unspecified foot: Secondary | ICD-10-CM | POA: Diagnosis present

## 2022-01-19 DIAGNOSIS — Z20822 Contact with and (suspected) exposure to covid-19: Secondary | ICD-10-CM | POA: Diagnosis present

## 2022-01-19 DIAGNOSIS — Z96611 Presence of right artificial shoulder joint: Secondary | ICD-10-CM | POA: Diagnosis present

## 2022-01-19 DIAGNOSIS — M48061 Spinal stenosis, lumbar region without neurogenic claudication: Secondary | ICD-10-CM | POA: Diagnosis present

## 2022-01-19 DIAGNOSIS — M1990 Unspecified osteoarthritis, unspecified site: Secondary | ICD-10-CM | POA: Diagnosis present

## 2022-01-19 DIAGNOSIS — Z7902 Long term (current) use of antithrombotics/antiplatelets: Secondary | ICD-10-CM | POA: Diagnosis not present

## 2022-01-19 LAB — ECHOCARDIOGRAM COMPLETE
Area-P 1/2: 3.33 cm2
Height: 68 in
S' Lateral: 2.8 cm
Weight: 3195.79 oz

## 2022-01-19 LAB — LIPID PANEL
Cholesterol: 143 mg/dL (ref 0–200)
HDL: 69 mg/dL (ref >40)
LDL Cholesterol: 60 mg/dL (ref 0–99)
Total CHOL/HDL Ratio: 2.1 RATIO
Triglycerides: 68 mg/dL (ref <150)
VLDL: 14 mg/dL (ref 0–40)

## 2022-01-19 MED ORDER — LORAZEPAM 2 MG/ML IJ SOLN
1.0000 mg | Freq: Once | INTRAMUSCULAR | Status: AC | PRN
Start: 2022-01-19 — End: 2022-01-19
  Administered 2022-01-19: 1 mg via INTRAVENOUS
  Filled 2022-01-19: qty 1

## 2022-01-19 MED ORDER — PERFLUTREN LIPID MICROSPHERE
1.0000 mL | INTRAVENOUS | Status: AC | PRN
  Administered 2022-01-19: 3 mL via INTRAVENOUS

## 2022-01-19 MED ORDER — NAPROXEN 250 MG PO TABS
250.0000 mg | ORAL_TABLET | Freq: Two times a day (BID) | ORAL | Status: DC
  Administered 2022-01-19 – 2022-01-20 (×4): 250 mg via ORAL
  Filled 2022-01-19 (×5): qty 1

## 2022-01-19 MED ORDER — GADOBUTROL 1 MMOL/ML IV SOLN
9.0000 mL | Freq: Once | INTRAVENOUS | Status: AC | PRN
Start: 2022-01-19 — End: 2022-01-19
  Administered 2022-01-19: 9 mL via INTRAVENOUS

## 2022-01-19 NOTE — Progress Notes (Signed)
Mobility Specialist Progress Note:   01/19/22 1227  Mobility  Activity Ambulated with assistance in room;Ambulated with assistance to bathroom  Level of Assistance Modified independent, requires aide device or extra time  Assistive Device None  Distance Ambulated (ft) 40 ft  Activity Response Tolerated well  $Mobility charge 1 Mobility   Pt needing to use restroom. No complaints of pain. Left in bed with call bell in reach and all needs met.   [facility] Jennye Runquist Mobility Specialist

## 2022-01-19 NOTE — Progress Notes (Addendum)
STROKE TEAM PROGRESS NOTE   INTERVAL HISTORY No family at the bedside. Patient is sitting up in the chair. Says his foot "felt like it had an ankle weight on it" while he was driving when he felt like he could not control his right foot. He was able to ambulate and felt back to baseline after the CT scan. He has been previously seen with embolic strokes but has not followed up for cardiac monitoring. Has had a laminectomy in the past.  He denies back pain or radicular pain at present but will obtain MRI lumbar spine w wo   Vitals:   01/18/22 2230 01/18/22 2300 01/19/22 0300 01/19/22 0400  BP:  (!) 149/115 (!) 141/88 (!) 141/88  Pulse: 82 74  99  Resp: '15 17  19  '$ Temp:  97.6 F (36.4 C)  (!) 97.3 F (36.3 C)  TempSrc:  Oral  Oral  SpO2: 98% 96% 98% 99%  Weight:      Height:       CBC:  Recent Labs  Lab 01/18/22 2039 01/18/22 2057  WBC 8.2  --   NEUTROABS 5.8  --   HGB 13.8 14.3  HCT 39.3 42.0  MCV 93.6  --   PLT 223  --    Basic Metabolic Panel:  Recent Labs  Lab 01/18/22 2039 01/18/22 2057  NA 130* 129*  K 4.2 4.1  CL 95* 94*  CO2 24  --   GLUCOSE 131* 129*  BUN 17 20  CREATININE 1.11 1.00  CALCIUM 9.0  --    Lipid Panel:  Recent Labs  Lab 01/19/22 0347  CHOL 143  TRIG 68  HDL 69  CHOLHDL 2.1  VLDL 14  LDLCALC 60   HgbA1c:  Recent Labs  Lab 01/18/22 2039  HGBA1C 5.4   Urine Drug Screen:  Recent Labs  Lab 01/18/22 2010  LABOPIA NONE DETECTED  COCAINSCRNUR NONE DETECTED  LABBENZ NONE DETECTED  AMPHETMU NONE DETECTED  THCU NONE DETECTED  LABBARB NONE DETECTED    Alcohol Level  Recent Labs  Lab 01/18/22 2039  [place] <10    IMAGING past 24 hours MR BRAIN WO CONTRAST  Result Date: 01/19/2022 CLINICAL DATA:  Stroke follow-up EXAM: MRI HEAD WITHOUT CONTRAST TECHNIQUE: Multiplanar, multiecho pulse sequences of the brain and surrounding structures were obtained without intravenous contrast. COMPARISON:  None Available. FINDINGS: Brain: No acute  infarct, mass effect or extra-axial collection. No acute hemorrhage. There is multifocal hyperintense T2-weighted signal within the white matter. Parenchymal volume and CSF spaces are normal. Old right occipital infarct with chronic blood products. The midline structures are normal. There is an old left cerebellar infarct. Vascular: Major flow voids are preserved. Skull and upper cervical spine: Normal calvarium and skull base. Visualized upper cervical spine and soft tissues are normal. Sinuses/Orbits:No paranasal sinus fluid levels or advanced mucosal thickening. No mastoid or middle ear effusion. Normal orbits. IMPRESSION: 1. No acute intracranial abnormality. 2. Old right occipital and left cerebellar infarcts. Electronically Signed   By: Ulyses Jarred M.D.   On: 01/19/2022 03:06   CT ANGIO HEAD NECK W WO CM  Result Date: 01/18/2022 CLINICAL DATA:  Right leg weakness EXAM: CT ANGIOGRAPHY HEAD AND NECK TECHNIQUE: Multidetector CT imaging of the head and neck was performed using the standard protocol during bolus administration of intravenous contrast. Multiplanar CT image reconstructions and MIPs were obtained to evaluate the vascular anatomy. Carotid stenosis measurements (when applicable) are obtained utilizing NASCET criteria, using the distal internal carotid  diameter as the denominator. RADIATION DOSE REDUCTION: This exam was performed according to the departmental dose-optimization program which includes automated exposure control, adjustment of the mA and/or kV according to patient size and/or use of iterative reconstruction technique. CONTRAST:  60m OMNIPAQUE IOHEXOL 350 MG/ML SOLN COMPARISON:  None Available. FINDINGS: CTA NECK FINDINGS SKELETON: There is no bony spinal canal stenosis. No lytic or blastic lesion. OTHER NECK: Normal pharynx, larynx and major salivary glands. No cervical lymphadenopathy. Unremarkable thyroid gland. UPPER CHEST: Emphysema at the right lung apex AORTIC ARCH: There is  calcific atherosclerosis of the aortic arch. There is no aneurysm, dissection or hemodynamically significant stenosis of the visualized portion of the aorta. Conventional 3 vessel aortic branching pattern. The visualized proximal subclavian arteries are widely patent. RIGHT CAROTID SYSTEM: No dissection, occlusion or aneurysm. There is mixed density atherosclerosis extending into the proximal ICA, resulting in less than 50% stenosis. LEFT CAROTID SYSTEM: Normal without aneurysm, dissection or stenosis. VERTEBRAL ARTERIES: Right dominant configuration. Both origins are clearly patent. There is no dissection, occlusion or flow-limiting stenosis to the skull base (V1-V3 segments). CTA HEAD FINDINGS POSTERIOR CIRCULATION: --Vertebral arteries: Normal V4 segments. --Inferior cerebellar arteries: Normal. --Basilar artery: Normal. --Superior cerebellar arteries: Normal. --Posterior cerebral arteries (PCA): Normal. ANTERIOR CIRCULATION: --Intracranial internal carotid arteries: Normal. --Anterior cerebral arteries (ACA): Normal. Both A1 segments are present. Patent anterior communicating artery (a-comm). --Middle cerebral arteries (MCA): Normal. VENOUS SINUSES: As permitted by contrast timing, patent. ANATOMIC VARIANTS: None Review of the MIP images confirms the above findings. IMPRESSION: No emergent large vessel occlusion or hemodynamically significant stenosis of the intracranial or cervical arteries. Aortic Atherosclerosis (ICD10-I70.0) and Emphysema (ICD10-J43.9). Electronically Signed   By: KUlyses JarredM.D.   On: 01/18/2022 23:31   CT HEAD CODE STROKE WO CONTRAST  Result Date: 01/18/2022 CLINICAL DATA:  Code stroke. Neuro deficit, acute, stroke suspected. Right foot weakness. EXAM: CT HEAD WITHOUT CONTRAST TECHNIQUE: Contiguous axial images were obtained from the base of the skull through the vertex without intravenous contrast. RADIATION DOSE REDUCTION: This exam was performed according to the departmental  dose-optimization program which includes automated exposure control, adjustment of the mA and/or kV according to patient size and/or use of iterative reconstruction technique. COMPARISON:  CT head without contrast and MR head without contrast 02/04/2021 FINDINGS: Brain: Moderate diffuse white matter disease is present scratched at moderate diffuse periventricular and subcortical white matter disease is stable. Remote infarcts of the right frontal lobe are stable. Remote infarcts of the medial posterior occipital lobes bilaterally are stable. A well-defined infarct of the inferior left cerebellum is new the left superior cerebellar infarct is also new since the prior study but appears more remote. The ventricles are of normal size. No significant extraaxial fluid collection is present. Vascular: No hyperdense vessel or unexpected calcification. Skull: Calvarium is intact. No focal lytic or blastic lesions are present. No significant extracranial soft tissue lesion is present. Sinuses/Orbits: Mild mucosal thickening is present in the inferior maxillary sinuses bilaterally anterior ethmoid air cells are partially opacified. The paranasal sinuses and mastoid air cells are otherwise clear. Bilateral lens replacements are noted. Globes and orbits are otherwise unremarkable. ASPECTS ([facility] Early CT Score) - Ganglionic level infarction (caudate, lentiform nuclei, internal capsule, insula, M1-M3 cortex): 7/7 - Supraganglionic infarction (M4-M6 cortex): 3/3 Total score (0-10 with 10 being normal): 10/10 IMPRESSION: 1. New inferior left cerebellar and left superior cerebellar infarcts. These are more remote. 2. Stable remote infarcts of the right frontal lobe and bilateral occipital  lobes. 3. Stable moderate diffuse white matter disease. This likely reflects the sequela of chronic microvascular ischemia. 4. ASPECTS is 10/10. The above was relayed via text pager to Dr. Curly Shores on 01/18/2022 at 20:58 .  Electronically Signed   By: San Morelle M.D.   On: 01/18/2022 20:58    PHYSICAL EXAM  Physical Exam  Constitutional: Appears well-developed and well-nourished.  Pleasant elderly Caucasian male Cardiovascular: Normal rate and regular rhythm.  Respiratory: Effort normal, non-labored breathing  Neuro: Mental Status: Patient is awake, alert, oriented to person, place, month, year, and situation. Patient is able to give a clear and coherent history. No signs of aphasia or neglect Cranial Nerves: II: Visual Fields are full. Pupils are equal, round, and reactive to light.   III,IV, VI: EOMI without ptosis or diploplia.  V: Facial sensation is symmetric to temperature VII: Facial movement is symmetric resting and smiling VIII: Hearing is intact to voice X: Palate elevates symmetrically XI: Shoulder shrug is symmetric. XII: Tongue protrudes midline without atrophy or fasciculations.  Motor: Tone is normal. Bulk is normal. 5/5 strength was present in all four extremities.  Sensory: Sensation is symmetric to light touch and temperature in the arms and legs. No extinction to DSS present.  Tinel's sign negative over the right peroneal groove. Cerebellar: FNF and HKS are intact bilaterally   ASSESSMENT/PLAN [name] is a 81 y.o. male with history of multiple prior embolic appearing strokes without residual, HTN, and HLD presenting with transient right lower extremity weakness. His symptoms have completely resolved. He denies numbness, tingling, or abnormal gait at this time. He does report having a lumbar laminectomy in the past. We will obtain lumbar spine MRI. Recommend continuing plavix as previously ordered. Recommend following up with cardiac monitoring outpatient as he has been previously sent a Zio Patch.  TIA vs sciatic /peroneal nerve impingement causing a foot drop Code Stroke CT head New inferior left cerebellar and left superior cerebellar infarcts. Stable remote  infarcts of the right frontal lobe and bilateral occipital lobes. CT Head and Neck- No LVO MRI  No acute abnormality, old right occipital and left cerebellar infarcts.  2D Echo EF 60-65% LDL 60 HgbA1c 5.4 VTE prophylaxis - Lovenox    Diet   Diet Heart Room service appropriate? Yes; Fluid consistency: Thin   clopidogrel 75 mg daily prior to admission, now on clopidogrel 75 mg daily.  Therapy recommendations:  No follow up  Disposition:  Pending  Hypertension Home meds:  Amlodipine, losartan Stable Permissive hypertension (OK if < 220/120) but gradually normalize in 5-7 days Long-term BP goal normotensive  Hyperlipidemia Home meds:  Atorvastatin '10mg'$ , resumed in hospital LDL 60, goal < 70 Continue statin at discharge  Other Stroke Risk Factors Advanced Age >/= 56  Obesity, Body mass index is 30.37 kg/m., BMI >/= 30 associated with increased stroke risk, recommend weight loss, diet and exercise as appropriate  Hx stroke/TIA 11/2020- right occipital lobe infarct, embolic 06/4399- bilateral frontal lobe infarct, likely cardio embolic  Other Active Problems Hyponatremia with a history of SIADH Baseline sodium is 129-131 Na 129 Arthritis BPH GERD  Hospital day # 0  Patient seen and examined by NP/APP with MD. MD to update note as needed.   Janine Ores, DNP, FNP-BC [facility] Pager: [phone]  STROKE MD NOTE :  I have personally obtained history,examined this patient, reviewed notes, independently viewed imaging studies, participated in medical decision making and plan of care.ROS completed by me personally and pertinent  positives fully documented  I have made any additions or clarifications directly to the above note. Agree with note above.  Patient presented with sudden onset of paleness right foot weakness and numbness which was transient and clear within minutes.  He denied any fall injury back pain or radicular pain but does admit he worked out a  little extra in the gym last few days.  He does have prior history of laminectomy and back surgery in 2017.  MRI brain is negative for acute stroke and CT angiograms are unremarkable.  Recommend check MRI of the lumbar spine to look for spinal stenosis versus lumbar radiculopathy.  Continue Plavix for stroke prevention and aggressive risk factor modification.  Greater than 50% time during this 50-minute visit was spent in counseling and coordination of care about his leg weakness discussion about differential diagnosis, evaluation and treatment plan and answering questions.  Antony Contras, MD Medical Director [facility] Pager: [phone] 01/19/2022 2:32 PM  To contact Stroke Continuity provider, please refer to [email or web address] After hours, contact General Neurology

## 2022-01-19 NOTE — Plan of Care (Signed)
  Problem: Education: Goal: Knowledge of General Education information will improve Description: Including pain rating scale, medication(s)/side effects and non-pharmacologic comfort measures Outcome: Progressing   Problem: Activity: Goal: Risk for activity intolerance will decrease Outcome: Progressing   Problem: Nutrition: Goal: Adequate nutrition will be maintained Outcome: Progressing   Problem: Coping: Goal: Level of anxiety will decrease Outcome: Progressing   

## 2022-01-19 NOTE — Evaluation (Signed)
Physical Therapy Evaluation Patient Details Name: Kenneth Montoya MRN: [id number] DOB: 05-05-1941 Today's Date: 01/19/2022  History of Present Illness  81 y.o. male presents to [facility] hospital on 01/18/2022 with R leg numbness and heaviness. CT head shows new inferior L cerebellar and L superior cerebellar infarcts. PMH includes CVA, BPH, skin cancer, HTN, shingles.  Clinical Impression  Pt presents to PT with mild deficits in balance and gait, drifting laterally when ambulating within room but able to self-correct path deviations. Pt reports he feels near his baseline, without deficits in LE coordination or sensation at this time. PT recommends no PT follow-up or DME at the time of discharge.       Recommendations for follow up therapy are one component of a multi-disciplinary discharge planning process, led by the attending physician.  Recommendations may be updated based on patient status, additional functional criteria and insurance authorization.  Follow Up Recommendations No PT follow up      Assistance Recommended at Discharge PRN  Patient can return home with the following       Equipment Recommendations None recommended by PT  Recommendations for Other Services       Functional Status Assessment Patient has had a recent decline in their functional status and demonstrates the ability to make significant improvements in function in a reasonable and predictable amount of time. (mild decline in balance, pt reports being near baseline)     Precautions / Restrictions Precautions Precautions: None Restrictions Weight Bearing Restrictions: No      Mobility  Bed Mobility                    Transfers Overall transfer level: Independent                      Ambulation/Gait Ambulation/Gait assistance: Supervision Gait Distance (Feet): 40 Feet Assistive device: None Gait Pattern/deviations: Drifts right/left Gait velocity: functional Gait velocity  interpretation: >2.62 ft/sec, indicative of community ambulatory   General Gait Details: mild dirft to right side during short bout of ambulation, pt declines ambulating out of room  Stairs            Wheelchair Mobility    Modified Rankin (Stroke Patients Only) Modified Rankin (Stroke Patients Only) Pre-Morbid Rankin Score: No symptoms Modified Rankin: No significant disability     Balance Overall balance assessment: Needs assistance Sitting-balance support: No upper extremity supported, Feet supported Sitting balance-Leahy Scale: Normal     Standing balance support: No upper extremity supported, During functional activity Standing balance-Leahy Scale: Good                               Pertinent Vitals/Pain Pain Assessment Pain Assessment: No/denies pain    Home Living Family/patient expects to be discharged to:: Private residence Living Arrangements: Spouse/significant other Available Help at Discharge: Family;Available 24 hours/day Type of Home: House Home Access: Stairs to enter Entrance Stairs-Rails: None Entrance Stairs-Number of Steps: 1   Home Layout: One level Home Equipment: Grab bars - toilet;Grab bars - tub/shower      Prior Function Prior Level of Function : Independent/Modified Independent;Driving                     Hand Dominance        Extremity/Trunk Assessment   Upper Extremity Assessment Upper Extremity Assessment: Overall WFL for tasks assessed    Lower Extremity Assessment Lower Extremity Assessment:  Overall [facility] for tasks assessed    Cervical / Trunk Assessment Cervical / Trunk Assessment: Normal  Communication   Communication: No difficulties (pt describes himself as loquacious)  Cognition Arousal/Alertness: Awake/alert Behavior During Therapy: WFL for tasks assessed/performed Overall Cognitive Status: Within Functional Limits for tasks assessed                                           General Comments General comments (skin integrity, edema, etc.): VSS on RA    Exercises     Assessment/Plan    PT Assessment Patient needs continued PT services  PT Problem List Decreased balance       PT Treatment Interventions Gait training;Balance training;Neuromuscular re-education;Patient/family education    PT Goals (Current goals can be found in the Care Plan section)  Acute Rehab PT Goals Patient Stated Goal: to return home PT Goal Formulation: With patient Time For Goal Achievement: 02/02/22 Potential to Achieve Goals: Good Additional Goals Additional Goal #1: Pt will score >19/24 on the DGI to indicate a reduced risk for falls Additional Goal #2: Pt will score >45/56 on the BERG to indicate a reduced risk for falls    Frequency Min 2X/week     Co-evaluation               AM-PAC PT "6 Clicks" Mobility  Outcome Measure Help needed turning from your back to your side while in a flat bed without using bedrails?: None Help needed moving from lying on your back to sitting on the side of a flat bed without using bedrails?: None Help needed moving to and from a bed to a chair (including a wheelchair)?: None Help needed standing up from a chair using your arms (e.g., wheelchair or bedside chair)?: None Help needed to walk in hospital room?: A Little Help needed climbing 3-5 steps with a railing? : A Little 6 Click Score: 22    End of Session   Activity Tolerance: Patient tolerated treatment well Patient left: in chair;with call bell/phone within reach Nurse Communication: Mobility status PT Visit Diagnosis: Other abnormalities of gait and mobility (R26.89)    Time: 4166-0630 PT Time Calculation (min) (ACUTE ONLY): 24 min   Charges:   PT Evaluation $PT Eval Low Complexity: [place], PT, DPT Acute Rehabilitation Office [phone]   Zenaida Niece 01/19/2022, 9:03 AM

## 2022-01-19 NOTE — Hospital Course (Signed)
81 y.o. male with medical history significant of CVA, hypertension, GERD, SIADH, hyperlipidemia and GAD who presents with right leg numbness and heaviness.  In ED, CT head shows new but remote inferior left cerebellar and left superior cerebellar infarcts.  Stable remote infarcts of the right frontal lobe and bilateral occipital lobe. Pt admitted for further work up

## 2022-01-19 NOTE — Evaluation (Signed)
Occupational Therapy Evaluation and Discharge Patient Details Name: Kenneth Montoya MRN: [id number] DOB: 11-14-1940 Today's Date: 01/19/2022   History of Present Illness 81 y.o. male presents to [facility] hospital on 01/18/2022 with R leg numbness and heaviness. CT head shows new inferior L cerebellar and L superior cerebellar infarcts. PMH includes CVA, BPH, skin cancer, HTN, shingles.   Clinical Impression   This 81 yo male admitted with above presents to acute OT back to his baseline of independent with left eye visual field loss from prior CVA. No further OT needs we will sign off.      Recommendations for follow up therapy are one component of a multi-disciplinary discharge planning process, led by the attending physician.  Recommendations may be updated based on patient status, additional functional criteria and insurance authorization.   Follow Up Recommendations  No OT follow up    Assistance Recommended at Discharge None     Functional Status Assessment  Patient has not had a recent decline in their functional status  Equipment Recommendations  None recommended by OT       Precautions / Restrictions Precautions Precautions: None Restrictions Weight Bearing Restrictions: No      Mobility Bed Mobility Overal bed mobility: Independent                  Transfers Overall transfer level: Independent                        Balance Overall balance assessment: Mild deficits observed, not formally tested (when up on his feet with dynamic gait)                                         ADL either performed or assessed with clinical judgement   ADL Overall ADL's : Independent                                             Vision Baseline Vision/History: 1 Wears glasses Ability to See in Adequate Light: 0 Adequate Patient Visual Report: Peripheral vision impairment (from old CVA) Vision Assessment?: Yes Eye Alignment: Within  Functional Limits Ocular Range of Motion: Within Functional Limits Alignment/Gaze Preference: Within Defined Limits Tracking/Visual Pursuits: Able to track stimulus in all quads without difficulty Saccades: Additional eye shifts occurred during testing Convergence: Within functional limits Visual Fields:  (left eye left inferior vision deficit from gross testing)            Pertinent Vitals/Pain Pain Assessment Pain Assessment: No/denies pain     Hand Dominance Left   Extremity/Trunk Assessment Upper Extremity Assessment Upper Extremity Assessment: Overall WFL for tasks assessed           Communication Communication Communication: No difficulties   Cognition Arousal/Alertness: Awake/alert   Overall Cognitive Status: Within Functional Limits for tasks assessed                                 General Comments: Pt does report from his prior stroke he has missing vision in his right eye--his eye doctor has him do the 360 field test every year. He reports the doctor told him to be careful driving.  Home Living Family/patient expects to be discharged to:: Private residence Living Arrangements: Spouse/significant other Available Help at Discharge: Family;Available 24 hours/day Type of Home: House Home Access: Stairs to enter [employer] of Steps: 1 Entrance Stairs-Rails: None Home Layout: One level     Bathroom Shower/Tub: Teacher, early years/pre: Handicapped height     Home Equipment: Grab bars - toilet;Grab bars - tub/shower          Prior Functioning/Environment Prior Level of Function : Independent/Modified Independent;Driving                        OT Problem List: Impaired vision/perception         OT Goals(Current goals can be found in the care plan section) Acute Rehab OT Goals Patient Stated Goal: to figure out why I had foot drop         AM-PAC OT "6 Clicks" Daily Activity      Outcome Measure Help from another person eating meals?: None Help from another person taking care of personal grooming?: None Help from another person toileting, which includes using toliet, bedpan, or urinal?: None Help from another person bathing (including washing, rinsing, drying)?: None Help from another person to put on and taking off regular upper body clothing?: None Help from another person to put on and taking off regular lower body clothing?: None 6 Click Score: 24   End of Session    Activity Tolerance: Patient tolerated treatment well Patient left: in chair;with family/visitor present  OT Visit Diagnosis:  (low vision at least left eye (pre-exsisting from prior CVA))                Time: 1228-1300 OT Time Calculation (min): 32 min Charges:  OT General Charges $OT Visit: 1 Visit OT Evaluation $OT Eval Moderate Complexity: 1 Mod OT Treatments $Self Care/Home Management : 8-22 mins  [place], OTR/L Acute Rehab Services [facility] [phone] Office [phone]    Almon Register 01/19/2022, 1:25 PM

## 2022-01-19 NOTE — Progress Notes (Signed)
  Progress Note   Patient: Kenneth Montoya:[id number] DOB: 05-Sep-1940 DOA: 01/18/2022     0 DOS: the patient was seen and examined on 01/19/2022   Brief hospital course: 81 y.o. male with medical history significant of CVA, hypertension, GERD, SIADH, hyperlipidemia and GAD who presents with right leg numbness and heaviness.  In ED, CT head shows new but remote inferior left cerebellar and left superior cerebellar infarcts.  Stable remote infarcts of the right frontal lobe and bilateral occipital lobe. Pt admitted for further work up    Assessment and Plan: * TIA (transient ischemic attack) Presented with symptoms of right lower extremity weakness and paresthesia around 6:30 PM tonight with complete resolution of symptoms now. -Has history of CVA in 12/13/2020 and 02/13/2021.  Stroke in 11/2018 correlated with COVID positivity with question of potential hypercoagulability resulting in stroke.  However, distribution of stroke in September appears to be embolic but patient has refused 30-day cardiac monitoring on multiple occasions..  Currently on monotherapy with Plavix.   -CT head shows new but remote inferior left cerebellar and left superior cerebellar infarcts.  Stable remote infarcts of the right frontal lobe and bilateral occipital lobe. -MRI brain neg for acute CVA -CTA head/neck with no emergent large vessel occlusion or hemodynamiclly significant stensosis - echocardiogram reviewed, normal LVEF with no evidence of intracardiac embolus -Neuro recs for lumbar MRI to r/o stenosis   Hyponatremia - Presented with sodium of 129 with history of SIADH.  Only has occasional alcohol use.  Baseline sodium ranges from 129-131. -Continue to monitor with morning labs   Hyperlipidemia with target LDL less than 100 Continue statin   Essential hypertension Hold antihypertensives and allow for permissive hypertension at this time      Subjective: Without complaints this AM  Physical  Exam: Vitals:   01/19/22 0300 01/19/22 0400 01/19/22 0812 01/19/22 1409  BP: (!) 141/88 (!) 141/88 (!) 134/100 (!) 148/70  Pulse:  99 89 86  Resp:  '19 15 12  '$ Temp:  (!) 97.3 F (36.3 C) 98.2 F (36.8 C) 98.6 F (37 C)  TempSrc:  Oral Oral Oral  SpO2: 98% 99% 98% 99%  Weight:      Height:       General exam: Awake, laying in bed, in nad Respiratory system: Normal respiratory effort, no wheezing Cardiovascular system: regular rate, s1, s2 Gastrointestinal system: Soft, nondistended, positive BS Central nervous system: CN2-12 grossly intact, strength intact Extremities: Perfused, no clubbing Skin: Normal skin turgor, no notable skin lesions seen Psychiatry: Mood normal // no visual hallucinations   Data Reviewed:  MRI brain reviewed: no acute abnormality, old R occipital and L cerebellar infarcts   Family Communication: Pt in room, family not at bedside  Disposition: Status is: Observation The patient remains OBS appropriate and will d/c before 2 midnights.  Planned Discharge Destination: Home     Author: Marylu Lund, MD 01/19/2022 4:47 PM  For on call review [email or web address].

## 2022-01-20 DIAGNOSIS — G459 Transient cerebral ischemic attack, unspecified: Secondary | ICD-10-CM | POA: Diagnosis not present

## 2022-01-20 DIAGNOSIS — E871 Hypo-osmolality and hyponatremia: Secondary | ICD-10-CM | POA: Diagnosis not present

## 2022-01-20 DIAGNOSIS — I10 Essential (primary) hypertension: Secondary | ICD-10-CM | POA: Diagnosis not present

## 2022-01-20 LAB — CBC
HCT: 36.9 % — ABNORMAL LOW (ref 39.0–52.0)
Hemoglobin: 12.8 g/dL — ABNORMAL LOW (ref 13.0–17.0)
MCH: 32.1 pg (ref 26.0–34.0)
MCHC: 34.7 g/dL (ref 30.0–36.0)
MCV: 92.5 fL (ref 80.0–100.0)
Platelets: 205 10*3/uL (ref 150–400)
RBC: 3.99 MIL/uL — ABNORMAL LOW (ref 4.22–5.81)
RDW: 11.5 % (ref 11.5–15.5)
WBC: 7.1 10*3/uL (ref 4.0–10.5)
nRBC: 0 % (ref 0.0–0.2)

## 2022-01-20 LAB — COMPREHENSIVE METABOLIC PANEL
ALT: 17 U/L (ref 0–44)
AST: 21 U/L (ref 15–41)
Albumin: 3.6 g/dL (ref 3.5–5.0)
Alkaline Phosphatase: 63 U/L (ref 38–126)
Anion gap: 8 (ref 5–15)
BUN: 15 mg/dL (ref 8–23)
CO2: 25 mmol/L (ref 22–32)
Calcium: 8.8 mg/dL — ABNORMAL LOW (ref 8.9–10.3)
Chloride: 94 mmol/L — ABNORMAL LOW (ref 98–111)
Creatinine, Ser: 1 mg/dL (ref 0.61–1.24)
GFR, Estimated: >60 mL/min (ref >60)
Glucose, Bld: 104 mg/dL — ABNORMAL HIGH (ref 70–99)
Potassium: 4.2 mmol/L (ref 3.5–5.1)
Sodium: 127 mmol/L — ABNORMAL LOW (ref 135–145)
Total Bilirubin: 0.6 mg/dL (ref 0.3–1.2)
Total Protein: 5.8 g/dL — ABNORMAL LOW (ref 6.5–8.1)

## 2022-01-20 NOTE — Progress Notes (Addendum)
STROKE TEAM PROGRESS NOTE   INTERVAL HISTORY No family at the bedside. Patient is sitting up in the chair.  He has had no recurrent foot weakness or numbness.  MRI scan lumbar spine shows postop changes of previous L2-S1 posterior decompression with adequate patency of the spinal canal but there is severe degenerative changes at L4-L5 and L5-S1 with severe foraminal narrowing on the right with likely encroachment of the nerve roots.  Neurological exam is unchanged.  Vital signs are stable.  Vitals:   01/20/22 0408 01/20/22 0519 01/20/22 0755 01/20/22 1145  BP: (!) 172/83 136/77 (!) 166/90   Pulse: 69 68 71 (!) 57  Resp: '17 20 16   '$ Temp: 97.7 F (36.5 C)  98.1 F (36.7 C)   TempSrc: Oral  Oral   SpO2: 99% 100% 98%   Weight:      Height:       CBC:  Recent Labs  Lab 01/18/22 2039 01/18/22 2057 01/20/22 0219  WBC 8.2  --  7.1  NEUTROABS 5.8  --   --   HGB 13.8 14.3 12.8*  HCT 39.3 42.0 36.9*  MCV 93.6  --  92.5  PLT 223  --  353   Basic Metabolic Panel:  Recent Labs  Lab 01/18/22 2039 01/18/22 2057 01/20/22 0219  NA 130* 129* 127*  K 4.2 4.1 4.2  CL 95* 94* 94*  CO2 24  --  25  GLUCOSE 131* 129* 104*  BUN '17 20 15  '$ CREATININE 1.11 1.00 1.00  CALCIUM 9.0  --  8.8*   Lipid Panel:  Recent Labs  Lab 01/19/22 0347  CHOL 143  TRIG 68  HDL 69  CHOLHDL 2.1  VLDL 14  LDLCALC 60   HgbA1c:  Recent Labs  Lab 01/18/22 2039  HGBA1C 5.4   Urine Drug Screen:  Recent Labs  Lab 01/18/22 2010  LABOPIA NONE DETECTED  COCAINSCRNUR NONE DETECTED  LABBENZ NONE DETECTED  AMPHETMU NONE DETECTED  THCU NONE DETECTED  LABBARB NONE DETECTED    Alcohol Level  Recent Labs  Lab 01/18/22 2039  ETH <10    IMAGING past 24 hours MR Lumbar Spine W Wo Contrast  Result Date: 01/19/2022 CLINICAL DATA:  Foot numbness EXAM: MRI LUMBAR SPINE WITHOUT AND WITH CONTRAST TECHNIQUE: Multiplanar and multiecho pulse sequences of the lumbar spine were obtained without and with  intravenous contrast. CONTRAST:  46m GADAVIST GADOBUTROL 1 MMOL/ML IV SOLN COMPARISON:  01/06/2008 FINDINGS: Segmentation:  Standard Alignment: Grade 1 retrolisthesis at L2-3 and grade 1 anterolisthesis at L5-S1 Vertebrae:  Nonspecific bone marrow heterogeneity.  No acute lesion. Conus medullaris and cauda equina: Conus extends to the L1 level. Conus and cauda equina appear normal. Paraspinal and other soft tissues: Negative Disc levels: L1-L2: Normal disc space and facet joints. No spinal canal stenosis. No neural foraminal stenosis. L2-L3: Bilateral laminectomies with improved patency of the spinal canal. No residual spinal canal stenosis. Unchanged mild bilateral neural foraminal stenosis. L3-L4: Posterior decompression. New/worsened disc bulge. No spinal canal stenosis. Severe right and moderate left neural foraminal stenosis. L4-L5: Posterior decompression. Disc bulge. No spinal canal stenosis. Severe right and mild left neural foraminal stenosis. L5-S1: Posterior decompression. Facet arthrosis with anterolisthesis and disc uncovering. No spinal canal stenosis. Severe right and mild left neural foraminal stenosis. Visualized sacrum: Normal. IMPRESSION: 1. Status post L2-S1 posterior decompression with improved patency of the spinal canal. 2. Severe right L4 and L5 foraminal stenosis. Electronically Signed   By: KCletus GashD.  On: 01/19/2022 20:57    PHYSICAL EXAM  Physical Exam  Constitutional: Appears well-developed and well-nourished.  Pleasant elderly Caucasian male Cardiovascular: Normal rate and regular rhythm.  Respiratory: Effort normal, non-labored breathing  Neuro: Mental Status: Patient is awake, alert, oriented to person, place, month, year, and situation. Patient is able to give a clear and coherent history. No signs of aphasia or neglect Cranial Nerves: II: Visual Fields are full. Pupils are equal, round, and reactive to light.   III,IV, VI: EOMI without ptosis or diploplia.   V: Facial sensation is symmetric to temperature VII: Facial movement is symmetric resting and smiling VIII: Hearing is intact to voice X: Palate elevates symmetrically XI: Shoulder shrug is symmetric. XII: Tongue protrudes midline without atrophy or fasciculations.  Motor: Tone is normal. Bulk is normal. 5/5 strength was present in all four extremities.  Sensory: Sensation is symmetric to light touch and temperature in the arms and legs. No extinction to DSS present.  Tinel's sign negative over the right peroneal groove. Cerebellar: FNF and HKS are intact bilaterally   ASSESSMENT/PLAN Kenneth Montoya is a 81 y.o. male with history of multiple prior embolic appearing strokes without residual, HTN, and HLD presenting with transient right lower extremity weakness. His symptoms have completely resolved. He denies numbness, tingling, or abnormal gait at this time. He does report having a lumbar laminectomy in the past. We will obtain lumbar spine MRI. Recommend continuing plavix as previously ordered. Recommend following up with cardiac monitoring outpatient as he has been previously sent a Zio Patch.  TIA vs sciatic /peroneal nerve impingement causing a foot drop Code Stroke CT head New inferior left cerebellar and left superior cerebellar infarcts. Stable remote infarcts of the right frontal lobe and bilateral occipital lobes. CT Head and Neck- No LVO MRI  No acute abnormality, old right occipital and left cerebellar infarcts.  2D Echo EF 60-65% LDL 60 HgbA1c 5.4 VTE prophylaxis - Lovenox    Diet   Diet Heart Room service appropriate? Yes; Fluid consistency: Thin   clopidogrel 75 mg daily prior to admission, now on clopidogrel 75 mg daily.  Therapy recommendations:  No follow up  Disposition:  Pending  Hypertension Home meds:  Amlodipine, losartan Stable Permissive hypertension (OK if < 220/120) but gradually normalize in 5-7 days Long-term BP goal  normotensive  Hyperlipidemia Home meds:  Atorvastatin '10mg'$ , resumed in hospital LDL 60, goal < 70 Continue statin at discharge  Other Stroke Risk Factors Advanced Age >/= 82  Obesity, Body mass index is 30.37 kg/m., BMI >/= 30 associated with increased stroke risk, recommend weight loss, diet and exercise as appropriate  Hx stroke/TIA 11/2020- right occipital lobe infarct, embolic 08/7094- bilateral frontal lobe infarct, likely cardio embolic  Other Active Problems Hyponatremia with a history of SIADH Baseline sodium is 129-131 Na 129 Arthritis BPH GERD   Patient presented with sudden onset of  right foot weakness and numbness which was transient and cleared within minutes.  MRI brain is negative for acute stroke and CT angiograms are unremarkable.    MRI of the lumbar spine shows postop changes of prior L2-S1 posterior decompression with patent canal but changes of severe right sided L4 and L5 foraminal stenosis with likely encroachment on the exiting nerve roots.  Patient needs referral to neurosurgery and they prefer to see Dr. Kristeen Miss and this can be arranged as an outpatient..  Continue Plavix for stroke prevention and aggressive risk factor modification.  Discussed with patient and Dr. Wyline Copas  and answered questions.  Greater than 50% time during this 35 minute visit was spent in counseling and coordination of care about his leg weakness discussion about differential diagnosis, evaluation and treatment plan and answering questions.  Antony Contras, MD Medical Director [facility] Pager: [phone] 01/20/2022 1:07 PM  To contact Stroke Continuity provider, please refer to [email or web address] After hours, contact General Neurology

## 2022-01-20 NOTE — Plan of Care (Signed)

## 2022-01-20 NOTE — Discharge Summary (Signed)
Physician Discharge Summary   Patient: Kenneth Montoya MRN: [id number] DOB: July 15, 1940  Admit date:     01/18/2022  Discharge date: 01/20/22  Discharge Physician: Marylu Lund   PCP: Janith Lima, MD   Recommendations at discharge:    Follow up with PCP in 1-2 weeks Follow up with Neurosurgery as scheduled  Discharge Diagnoses: Principal Problem:   TIA (transient ischemic attack) Active Problems:   Essential hypertension   Hyperlipidemia with target LDL less than 100   Hyponatremia   SIADH (syndrome of inappropriate ADH production) ([place])  Resolved Problems:   * No resolved hospital problems. *  Hospital Course: 81 y.o. male with medical history significant of CVA, hypertension, GERD, SIADH, hyperlipidemia and GAD who presents with right leg numbness and heaviness.  In ED, CT head shows new but remote inferior left cerebellar and left superior cerebellar infarcts.  Stable remote infarcts of the right frontal lobe and bilateral occipital lobe. Pt admitted for further work up    Assessment and Plan:  * weakness secondary to L4-L5 foraminal stenosis, TIA ruled out Presented with symptoms of right lower extremity weakness and paresthesia around 6:30 PM tonight with complete resolution of symptoms now. -Has history of CVA in 12/13/2020 and 02/13/2021.  Stroke in 11/2018 correlated with COVID positivity with question of potential hypercoagulability resulting in stroke.  However, distribution of stroke in September appears to be embolic but patient has refused 30-day cardiac monitoring on multiple occasions..  Currently on monotherapy with Plavix.   -CT head shows new but remote inferior left cerebellar and left superior cerebellar infarcts.  Stable remote infarcts of the right frontal lobe and bilateral occipital lobe. -MRI brain neg for acute CVA -CTA head/neck with no emergent large vessel occlusion or hemodynamiclly significant stensosis - echocardiogram reviewed, normal LVEF with  no evidence of intracardiac embolus -Lumbar MRI reviewed. Findings notable for Severe R L4-L5 foraminal stenosis. Pt advised to f/u with Neurosurgery    Hyponatremia - Presented with sodium of 129 with history of SIADH.  Only has occasional alcohol use.  Baseline sodium ranges from 129-131. -remained stable   Hyperlipidemia with target LDL less than 100 Continue statin   Essential hypertension Continue home meds on d/c        Consultants: Neurology Procedures performed:   Disposition: Home Diet recommendation:  Cardiac diet DISCHARGE MEDICATION: Allergies as of 01/20/2022   No Known Allergies      Medication List     STOP taking these medications    fluocinonide-emollient 0.05 % cream Commonly known as: LIDEX-E   traZODone 50 MG tablet Commonly known as: DESYREL       TAKE these medications    acetaminophen 500 MG tablet Commonly known as: TYLENOL Take 1,000 mg by mouth in the morning and at bedtime.   amLODipine 10 MG tablet Commonly known as: NORVASC TAKE 1 TABLET BY MOUTH AT  BEDTIME What changed: when to take this   ascorbic acid 1000 MG tablet Commonly known as: VITAMIN C Take 1,000 mg by mouth at bedtime.   atorvastatin 10 MG tablet Commonly known as: LIPITOR TAKE 1 TABLET(10 MG) BY MOUTH DAILY What changed: See the new instructions.   Claritin 10 MG tablet Generic drug: loratadine Take 10 mg by mouth daily.   clopidogrel 75 MG tablet Commonly known as: PLAVIX TAKE 1 TABLET(75 MG) BY MOUTH DAILY What changed: See the new instructions.   fluticasone 50 MCG/ACT nasal spray Commonly known as: FLONASE PLACE 1 SPRAY IN BOTH  NOSTRILS DAILY What changed: See the new instructions.   losartan 100 MG tablet Commonly known as: COZAAR TAKE 1 TABLET BY MOUTH DAILY   naproxen sodium 220 MG tablet Commonly known as: ALEVE Take 440 mg by mouth 2 (two) times daily with a meal.   olopatadine 0.1 % ophthalmic solution Commonly known as:  PATANOL Place 1 drop into both eyes at bedtime.   omeprazole 40 MG capsule Commonly known as: PRILOSEC TAKE 1 CAPSULE BY MOUTH  DAILY What changed: additional instructions   PreviDent 5000 Plus 1.1 % Crea dental cream Generic drug: sodium fluoride SMARTSIG:Application By Mouth   psyllium 58.6 % powder Commonly known as: METAMUCIL Take 1 packet by mouth at bedtime.   tamsulosin 0.4 MG Caps capsule Commonly known as: FLOMAX Take 0.4 mg by mouth at bedtime.        Follow-up Information     Janith Lima, MD Follow up in 2 week(s).   Specialty: Internal Medicine Why: Hospital follow up Contact information: [place] [address] [phone]         Belva Crome, MD .   Specialty: Cardiology Contact information: [phone] [address] [place] [address] [phone]         Kristeen Miss, MD. Schedule an appointment as soon as possible for a visit.   Specialty: Neurosurgery Why: Hospital follow up Contact information: [address] [place] [address] [place] [address] [phone]                Discharge Exam: Danley Danker Weights   01/18/22 2054 01/18/22 2110  Weight: 90.6 kg 90.6 kg   General exam: Awake, laying in bed, in nad Respiratory system: Normal respiratory effort, no wheezing Cardiovascular system: regular rate, s1, s2 Gastrointestinal system: Soft, nondistended, positive BS Central nervous system: CN2-12 grossly intact, strength intact Extremities: Perfused, no clubbing Skin: Normal skin turgor, no notable skin lesions seen Psychiatry: Mood normal // no visual hallucinations   Condition at discharge: fair  The results of significant diagnostics from this hospitalization (including imaging, microbiology, ancillary and laboratory) are listed below for reference.   Imaging Studies: MR Lumbar Spine W Wo Contrast  Result Date: 01/19/2022 CLINICAL DATA:  Foot numbness EXAM: MRI LUMBAR SPINE WITHOUT AND  WITH CONTRAST TECHNIQUE: Multiplanar and multiecho pulse sequences of the lumbar spine were obtained without and with intravenous contrast. CONTRAST:  26m GADAVIST GADOBUTROL 1 MMOL/ML IV SOLN COMPARISON:  01/06/2008 FINDINGS: Segmentation:  Standard Alignment: Grade 1 retrolisthesis at L2-3 and grade 1 anterolisthesis at L5-S1 Vertebrae:  Nonspecific bone marrow heterogeneity.  No acute lesion. Conus medullaris and cauda equina: Conus extends to the L1 level. Conus and cauda equina appear normal. Paraspinal and other soft tissues: Negative Disc levels: L1-L2: Normal disc space and facet joints. No spinal canal stenosis. No neural foraminal stenosis. L2-L3: Bilateral laminectomies with improved patency of the spinal canal. No residual spinal canal stenosis. Unchanged mild bilateral neural foraminal stenosis. L3-L4: Posterior decompression. New/worsened disc bulge. No spinal canal stenosis. Severe right and moderate left neural foraminal stenosis. L4-L5: Posterior decompression. Disc bulge. No spinal canal stenosis. Severe right and mild left neural foraminal stenosis. L5-S1: Posterior decompression. Facet arthrosis with anterolisthesis and disc uncovering. No spinal canal stenosis. Severe right and mild left neural foraminal stenosis. Visualized sacrum: Normal. IMPRESSION: 1. Status post L2-S1 posterior decompression with improved patency of the spinal canal. 2. Severe right L4 and L5 foraminal stenosis. Electronically Signed   By: KCletus GashD.  On: 01/19/2022 20:57   ECHOCARDIOGRAM COMPLETE  Result Date: 01/19/2022    ECHOCARDIOGRAM REPORT   Patient Name:   Kenneth Montoya Date of Exam: 01/19/2022 Medical Rec #:  [id number]        Height:       68.0 in Accession #:    [id number]       Weight:       199.7 lb Date of Birth:  [birth year]         BSA:          2.043 m Patient Age:    43 years         BP:           141/88 mmHg Patient Gender: M                HR:           89 bpm. Exam Location:  Inpatient  Procedure: 2D Echo, Color Doppler, Cardiac Doppler and Intracardiac            Opacification Agent Indications:    TIA  History:        Patient has prior history of Echocardiogram examinations, most                 recent 02/05/2021. Risk Factors:Hypertension and Dyslipidemia.  Sonographer:    Raquel Sarna Senior RDCS Referring Phys: [id number] [place] [facility]  Sonographer Comments: Technically difficult study due to lung interference. IMPRESSIONS  1. Left ventricular ejection fraction, by estimation, is 60 to 65%. The left ventricle has normal function. The left ventricle has no regional wall motion abnormalities. There is mild left ventricular hypertrophy. Left ventricular diastolic parameters are indeterminate.  2. Right ventricular systolic function is normal. The right ventricular size is normal. Tricuspid regurgitation signal is inadequate for assessing PA pressure.  3. Left atrial size was mildly dilated.  4. The mitral valve was not well visualized. No evidence of mitral valve regurgitation. No evidence of mitral stenosis.  5. The aortic valve was not well visualized. Aortic valve regurgitation is not visualized. No aortic stenosis is present.  6. The inferior vena cava is normal in size with greater than 50% respiratory variability, suggesting right atrial pressure of 3 mmHg. Comparison(s): No significant change from prior study. Conclusion(s)/Recommendation(s): No intracardiac source of embolism detected on this transthoracic study. Consider a transesophageal echocardiogram to exclude cardiac source of embolism if clinically indicated. Normal wall motion with echo contrast. Valves not well visualized but no significant disease by Doppler. FINDINGS  Left Ventricle: Left ventricular ejection fraction, by estimation, is 60 to 65%. The left ventricle has normal function. The left ventricle has no regional wall motion abnormalities. Definity contrast agent was given IV to delineate the left ventricular  endocardial borders.  The left ventricular internal cavity size was normal in size. There is mild left ventricular hypertrophy. Left ventricular diastolic parameters are indeterminate. Right Ventricle: The right ventricular size is normal. Right vetricular wall thickness was not well visualized. Right ventricular systolic function is normal. Tricuspid regurgitation signal is inadequate for assessing PA pressure. Left Atrium: Left atrial size was mildly dilated. Right Atrium: Right atrial size was normal in size. Pericardium: Trivial pericardial effusion is present. Mitral Valve: The mitral valve was not well visualized. No evidence of mitral valve regurgitation. No evidence of mitral valve stenosis. Tricuspid Valve: The tricuspid valve is not well visualized. Tricuspid valve regurgitation is not demonstrated. No evidence of tricuspid stenosis. Aortic Valve: The aortic valve was not well  visualized. Aortic valve regurgitation is not visualized. No aortic stenosis is present. Pulmonic Valve: The pulmonic valve was not well visualized. Pulmonic valve regurgitation is not visualized. Aorta: The aortic root, ascending aorta, aortic arch and descending aorta are all structurally normal, with no evidence of dilitation or obstruction. Venous: The inferior vena cava is normal in size with greater than 50% respiratory variability, suggesting right atrial pressure of 3 mmHg. IAS/Shunts: The interatrial septum was not well visualized.  LEFT VENTRICLE PLAX 2D LVIDd:         3.70 cm   Diastology LVIDs:         2.80 cm   LV e' medial:    5.87 cm/s LV PW:         1.00 cm   LV E/e' medial:  11.1 LV IVS:        0.80 cm   LV e' lateral:   9.25 cm/s LVOT diam:     2.10 cm   LV E/e' lateral: 7.0 LV SV:         57 LV SV Index:   28 LVOT Area:     3.46 cm  RIGHT VENTRICLE RV S prime:     13.20 cm/s TAPSE (M-mode): 1.8 cm LEFT ATRIUM             Index        RIGHT ATRIUM           Index LA diam:        3.20 cm 1.57 cm/m   RA Area:     15.00 cm LA Vol (A2C):    50.6 ml 24.75 ml/m  RA Volume:   36.00 ml  17.62 ml/m LA Vol (A4C):   60.6 ml 29.67 ml/m LA Biplane Vol: 62.6 ml 30.65 ml/m  AORTIC VALVE LVOT Vmax:   98.30 cm/s LVOT Vmean:  64.200 cm/s LVOT VTI:    0.166 m  AORTA Ao Root diam: 3.60 cm Ao Asc diam:  3.20 cm MITRAL VALVE MV Area (PHT): 3.33 cm     SHUNTS MV Decel Time: 228 msec     Systemic VTI:  0.17 m MV E velocity: 65.00 cm/s   Systemic Diam: 2.10 cm MV A velocity: 126.00 cm/s MV E/A ratio:  0.52 Buford Dresser MD Electronically signed by Buford Dresser MD Signature Date/Time: 01/19/2022/1:07:15 PM    Final    MR BRAIN WO CONTRAST  Result Date: 01/19/2022 CLINICAL DATA:  Stroke follow-up EXAM: MRI HEAD WITHOUT CONTRAST TECHNIQUE: Multiplanar, multiecho pulse sequences of the brain and surrounding structures were obtained without intravenous contrast. COMPARISON:  None Available. FINDINGS: Brain: No acute infarct, mass effect or extra-axial collection. No acute hemorrhage. There is multifocal hyperintense T2-weighted signal within the white matter. Parenchymal volume and CSF spaces are normal. Old right occipital infarct with chronic blood products. The midline structures are normal. There is an old left cerebellar infarct. Vascular: Major flow voids are preserved. Skull and upper cervical spine: Normal calvarium and skull base. Visualized upper cervical spine and soft tissues are normal. Sinuses/Orbits:No paranasal sinus fluid levels or advanced mucosal thickening. No mastoid or middle ear effusion. Normal orbits. IMPRESSION: 1. No acute intracranial abnormality. 2. Old right occipital and left cerebellar infarcts. Electronically Signed   By: Ulyses Jarred M.D.   On: 01/19/2022 03:06   CT ANGIO HEAD NECK W WO CM  Result Date: 01/18/2022 CLINICAL DATA:  Right leg weakness EXAM: CT ANGIOGRAPHY HEAD AND NECK TECHNIQUE: Multidetector CT imaging of the head and neck  was performed using the standard protocol during bolus administration of  intravenous contrast. Multiplanar CT image reconstructions and MIPs were obtained to evaluate the vascular anatomy. Carotid stenosis measurements (when applicable) are obtained utilizing NASCET criteria, using the distal internal carotid diameter as the denominator. RADIATION DOSE REDUCTION: This exam was performed according to the departmental dose-optimization program which includes automated exposure control, adjustment of the mA and/or kV according to patient size and/or use of iterative reconstruction technique. CONTRAST:  75m OMNIPAQUE IOHEXOL 350 MG/ML SOLN COMPARISON:  None Available. FINDINGS: CTA NECK FINDINGS SKELETON: There is no bony spinal canal stenosis. No lytic or blastic lesion. OTHER NECK: Normal pharynx, larynx and major salivary glands. No cervical lymphadenopathy. Unremarkable thyroid gland. UPPER CHEST: Emphysema at the right lung apex AORTIC ARCH: There is calcific atherosclerosis of the aortic arch. There is no aneurysm, dissection or hemodynamically significant stenosis of the visualized portion of the aorta. Conventional 3 vessel aortic branching pattern. The visualized proximal subclavian arteries are widely patent. RIGHT CAROTID SYSTEM: No dissection, occlusion or aneurysm. There is mixed density atherosclerosis extending into the proximal ICA, resulting in less than 50% stenosis. LEFT CAROTID SYSTEM: Normal without aneurysm, dissection or stenosis. VERTEBRAL ARTERIES: Right dominant configuration. Both origins are clearly patent. There is no dissection, occlusion or flow-limiting stenosis to the skull base (V1-V3 segments). CTA HEAD FINDINGS POSTERIOR CIRCULATION: --Vertebral arteries: Normal V4 segments. --Inferior cerebellar arteries: Normal. --Basilar artery: Normal. --Superior cerebellar arteries: Normal. --Posterior cerebral arteries (PCA): Normal. ANTERIOR CIRCULATION: --Intracranial internal carotid arteries: Normal. --Anterior cerebral arteries (ACA): Normal. Both A1 segments  are present. Patent anterior communicating artery (a-comm). --Middle cerebral arteries (MCA): Normal. VENOUS SINUSES: As permitted by contrast timing, patent. ANATOMIC VARIANTS: None Review of the MIP images confirms the above findings. IMPRESSION: No emergent large vessel occlusion or hemodynamically significant stenosis of the intracranial or cervical arteries. Aortic Atherosclerosis (ICD10-I70.0) and Emphysema (ICD10-J43.9). Electronically Signed   By: KUlyses JarredM.D.   On: 01/18/2022 23:31   CT HEAD CODE STROKE WO CONTRAST  Result Date: 01/18/2022 CLINICAL DATA:  Code stroke. Neuro deficit, acute, stroke suspected. Right foot weakness. EXAM: CT HEAD WITHOUT CONTRAST TECHNIQUE: Contiguous axial images were obtained from the base of the skull through the vertex without intravenous contrast. RADIATION DOSE REDUCTION: This exam was performed according to the departmental dose-optimization program which includes automated exposure control, adjustment of the mA and/or kV according to patient size and/or use of iterative reconstruction technique. COMPARISON:  CT head without contrast and MR head without contrast 02/04/2021 FINDINGS: Brain: Moderate diffuse white matter disease is present scratched at moderate diffuse periventricular and subcortical white matter disease is stable. Remote infarcts of the right frontal lobe are stable. Remote infarcts of the medial posterior occipital lobes bilaterally are stable. A well-defined infarct of the inferior left cerebellum is new the left superior cerebellar infarct is also new since the prior study but appears more remote. The ventricles are of normal size. No significant extraaxial fluid collection is present. Vascular: No hyperdense vessel or unexpected calcification. Skull: Calvarium is intact. No focal lytic or blastic lesions are present. No significant extracranial soft tissue lesion is present. Sinuses/Orbits: Mild mucosal thickening is present in the inferior  maxillary sinuses bilaterally anterior ethmoid air cells are partially opacified. The paranasal sinuses and mastoid air cells are otherwise clear. Bilateral lens replacements are noted. Globes and orbits are otherwise unremarkable. ASPECTS ([facility] Early [facility] Score) - Ganglionic level infarction (caudate, lentiform nuclei, internal capsule, insula, M1-M3 cortex): 7/7 -  Supraganglionic infarction (M4-M6 cortex): 3/3 Total score (0-10 with 10 being normal): 10/10 IMPRESSION: 1. New inferior left cerebellar and left superior cerebellar infarcts. These are more remote. 2. Stable remote infarcts of the right frontal lobe and bilateral occipital lobes. 3. Stable moderate diffuse white matter disease. This likely reflects the sequela of chronic microvascular ischemia. 4. ASPECTS is 10/10. The above was relayed via text pager to Dr. Curly Shores on 01/18/2022 at 20:58 . Electronically Signed   By: San Morelle M.D.   On: 01/18/2022 20:58    Microbiology: Results for orders placed or performed during the hospital encounter of 01/18/22  Resp Panel by RT-PCR (Flu A&B, Covid) Anterior Nasal Swab     Status: None   Collection Time: 01/18/22  8:33 PM   Specimen: Anterior Nasal Swab  Result Value Ref Range Status   SARS Coronavirus 2 by RT PCR NEGATIVE NEGATIVE Final    Comment: (NOTE) SARS-CoV-2 target nucleic acids are NOT DETECTED.  The SARS-CoV-2 RNA is generally detectable in upper respiratory specimens during the acute phase of infection. The lowest concentration of SARS-CoV-2 viral copies this assay can detect is 138 copies/mL. A negative result does not preclude SARS-Cov-2 infection and should not be used as the sole basis for treatment or other patient management decisions. A negative result may occur with  improper specimen collection/handling, submission of specimen other than nasopharyngeal swab, presence of viral mutation(s) within the areas targeted by this assay, and inadequate  number of viral copies(<138 copies/mL). A negative result must be combined with clinical observations, patient history, and epidemiological information. The expected result is Negative.  Fact Sheet for Patients:  EntrepreneurPulse.com.au  Fact Sheet for Healthcare Providers:  IncredibleEmployment.be  This test is no t yet approved or cleared by the [place] FDA and  has been authorized for detection and/or diagnosis of SARS-CoV-2 by FDA under an Emergency Use Authorization (EUA). This EUA will remain  in effect (meaning this test can be used) for the duration of the COVID-19 declaration under Section 564(b)(1) of the Act, 21 U.S.C.section 360bbb-3(b)(1), unless the authorization is terminated  or revoked sooner.       Influenza A by PCR NEGATIVE NEGATIVE Final   Influenza B by PCR NEGATIVE NEGATIVE Final    Comment: (NOTE) The Xpert Xpress SARS-CoV-2/FLU/RSV plus assay is intended as an aid in the diagnosis of influenza from Nasopharyngeal swab specimens and should not be used as a sole basis for treatment. Nasal washings and aspirates are unacceptable for Xpert Xpress SARS-CoV-2/FLU/RSV testing.  Fact Sheet for Patients: EntrepreneurPulse.com.au  Fact Sheet for Healthcare Providers: IncredibleEmployment.be  This test is not yet approved or cleared by the [place] FDA and has been authorized for detection and/or diagnosis of SARS-CoV-2 by FDA under an Emergency Use Authorization (EUA). This EUA will remain in effect (meaning this test can be used) for the duration of the COVID-19 declaration under Section 564(b)(1) of the Act, 21 U.S.C. section 360bbb-3(b)(1), unless the authorization is terminated or revoked.  Performed at [facility], [place] [address], [place], [place] [address]     Labs: CBC: Recent Labs  Lab 01/18/22 2039 01/18/22 2057 01/20/22 0219  WBC 8.2  --  7.1   NEUTROABS 5.8  --   --   HGB 13.8 14.3 12.8*  HCT 39.3 42.0 36.9*  MCV 93.6  --  92.5  PLT 223  --  604   Basic Metabolic Panel: Recent Labs  Lab 01/18/22 2039 01/18/22 2057 01/20/22 0219  NA 130* 129* 127*  K 4.2 4.1 4.2  CL 95* 94* 94*  CO2 24  --  25  GLUCOSE 131* 129* 104*  BUN '17 20 15  '$ CREATININE 1.11 1.00 1.00  CALCIUM 9.0  --  8.8*   Liver Function Tests: Recent Labs  Lab 01/18/22 2039 01/20/22 0219  AST 22 21  ALT 18 17  ALKPHOS 87 63  BILITOT 0.8 0.6  PROT 6.4* 5.8*  ALBUMIN 4.0 3.6   CBG: Recent Labs  Lab 01/18/22 2113  GLUCAP 124*    Discharge time spent: less than 30 minutes.  Signed: Marylu Lund, MD [facility] 01/20/2022

## 2022-01-21 ENCOUNTER — Telehealth: Payer: Self-pay

## 2022-01-21 NOTE — Telephone Encounter (Signed)
Transition Care Management Follow-up Telephone Call Date of discharge and from where: [place] 01-20-22 Dx: TIA How have you been since you were released from the hospital? Doing better Any questions or concerns? No  Items Reviewed: Did the pt receive and understand the discharge instructions provided? Yes  Medications obtained and verified? Yes  Other? No  Any new allergies since your discharge? No  Dietary orders reviewed? Yes Do you have support at home? Yes   Home Care and Equipment/Supplies: Were home health services ordered? no If so, what is the name of the agency? na  Has the agency set up a time to come to the patient's home? not applicable Were any new equipment or medical supplies ordered?  No What is the name of the medical supply agency? na Were you able to get the supplies/equipment? not applicable Do you have any questions related to the use of the equipment or supplies? No  Functional Questionnaire: (I = Independent and D = Dependent) ADLs: I  Bathing/Dressing- I  Meal Prep- I  Eating- I  Maintaining continence- I  Transferring/Ambulation- I  Managing Meds- I  Follow up appointments reviewed:  PCP Hospital f/u appt confirmed? Yes  Scheduled to see Dr Quay Burow on 01-25-22 @ [facility] f/u appt confirmed? No- pt is aware to call Dr Ellene Route and Dr Leonie Man to make fu appt  Are transportation arrangements needed? No  If their condition worsens, is the pt aware to call PCP or go to the Emergency Dept.? Yes Was the patient provided with contact information for the PCP's office or ED? Yes Was to pt encouraged to call back with questions or concerns? Yes

## 2022-01-22 ENCOUNTER — Other Ambulatory Visit: Payer: Self-pay | Admitting: Internal Medicine

## 2022-01-24 NOTE — Progress Notes (Signed)
Subjective:    Patient ID: Kenneth Montoya, male    DOB: [birth year], 81 y.o.   MRN: [id number]     HPI Kenneth Montoya is here for follow up from the hospital.   Admitted 01/18/22 - 01/20/22 for RLE numbness, heaviness  Recommendations at discharge:     Follow up with PCP in 1-2 weeks Follow up with Neurosurgery as scheduled   He has a h/o CVA, htn, hld, GERD, SIADH, GAD who presented to the ED with RLE numbness and heaviness.  He was driving when the symptoms started suddenly - he had difficulty moving his left. The symptoms resolved 30 seconds later.  He denied headache, change in vision, chest pain or palpitations.  He was a code stroke in the ED.  Ct head showed new but remote inferior left cerebellar and left superior cerebellar infarcts.  Stable remote infarcts of the right frontal lobe and b/l occipital lobe.  His sodium was 129 - baseline is 128-131.  Other labs,urine tests were normal.  EKG with RBBB and LAFB.  Neurology was consulted and he was admitted.    RLE numbness, heaviness x 30 seconds: Previous CVAs seen on imaging - should do holter monitor to r/o afib Ct shows new remote infarcts and stable remote infarcts MRI neg for acute CVA CTA head/neck w/o large vessel occlusion or significant stenosis Echo - normal EF, no intracardiac embolus Continue plavix daily TIA ruled out H/o CVA 12/13/20, 02/13/21.  Stroke  Lumbar MRI showed severe R L4-5 foraminal stenosis.  To follow up with neurosurgery  Hyponatremia - from SIADH Na 129 in ED, baseline 129-131 Stable  Hyperlipidemia: Target  LDL < 70 - at goal On atorvastatin 10 mg daily  Hypertension: Continued on home meds   He denies any leg numbness, tingling, heaviness or weakness since leaving the hospital.  He knows it was a pinched nerve and because he is not symptomatic does not feel that he needs to follow-up with neurosurgery.  He was not aware of any additional stroke seen on imaging.  He is taking his  medication daily.    Medications and allergies reviewed with patient and updated if appropriate.  Current Outpatient Medications on File Prior to Visit  Medication Sig Dispense Refill   acetaminophen (TYLENOL) 500 MG tablet Take 1,000 mg by mouth in the morning and at bedtime.     amLODipine (NORVASC) 10 MG tablet TAKE 1 TABLET BY MOUTH AT  BEDTIME (Patient taking differently: Take 10 mg by mouth daily.) 90 tablet 3   ascorbic acid (VITAMIN C) 1000 MG tablet Take 1,000 mg by mouth at bedtime.     atorvastatin (LIPITOR) 10 MG tablet TAKE 1 TABLET(10 MG) BY MOUTH DAILY 90 tablet 1   clopidogrel (PLAVIX) 75 MG tablet TAKE 1 TABLET(75 MG) BY MOUTH DAILY (Patient taking differently: Take 75 mg by mouth daily.) 90 tablet 1   ferrous sulfate 324 MG TBEC Take 324 mg by mouth daily with breakfast.     fluticasone (FLONASE) 50 MCG/ACT nasal spray PLACE 1 SPRAY IN BOTH  NOSTRILS DAILY (Patient taking differently: Place 1 spray into both nostrils daily.) 32 g 1   loratadine (CLARITIN) 10 MG tablet Take 10 mg by mouth daily.     losartan (COZAAR) 100 MG tablet TAKE 1 TABLET BY MOUTH DAILY (Patient taking differently: Take 100 mg by mouth daily.) 90 tablet 1   naproxen sodium (ALEVE) 220 MG tablet Take 440 mg by mouth 2 (two) times daily  with a meal.     olopatadine (PATANOL) 0.1 % ophthalmic solution Place 1 drop into both eyes at bedtime.     omeprazole (PRILOSEC) 40 MG capsule TAKE 1 CAPSULE BY MOUTH  DAILY (Patient taking differently: Take 40 mg by mouth daily. TAKE 1 CAPSULE BY MOUTH  DAILY) 90 capsule 3   PREVIDENT 5000 PLUS 1.1 % CREA dental cream SMARTSIG:Application By Mouth     psyllium (METAMUCIL) 58.6 % powder Take 1 packet by mouth at bedtime.     tamsulosin (FLOMAX) 0.4 MG CAPS capsule Take 0.4 mg by mouth at bedtime.     No current facility-administered medications on file prior to visit.     Review of Systems  Constitutional:  Negative for appetite change, chills and fever.   Respiratory:  Negative for cough, shortness of breath and wheezing.   Cardiovascular:  Negative for chest pain, palpitations and leg swelling.  Neurological:  Negative for dizziness, speech difficulty, weakness, light-headedness, numbness and headaches.       Objective:   Vitals:   01/25/22 1514  BP: 138/80  Pulse: 77  Temp: 98 F (36.7 C)  SpO2: 94%   BP Readings from Last 3 Encounters:  01/25/22 138/80  01/20/22 (!) 166/90  12/12/21 138/84   Wt Readings from Last 3 Encounters:  01/25/22 200 lb (90.7 kg)  01/18/22 199 lb 11.8 oz (90.6 kg)  01/16/22 198 lb (89.8 kg)   Body mass index is 30.41 kg/m.    Physical Exam Constitutional:      General: He is not in acute distress.    Appearance: Normal appearance. He is not ill-appearing.  HENT:     Head: Normocephalic and atraumatic.  Eyes:     Conjunctiva/sclera: Conjunctivae normal.  Cardiovascular:     Rate and Rhythm: Normal rate and regular rhythm.     Heart sounds: Normal heart sounds. No murmur heard. Pulmonary:     Effort: Pulmonary effort is normal. No respiratory distress.     Breath sounds: Normal breath sounds. No wheezing or rales.  Musculoskeletal:     Right lower leg: No edema.     Left lower leg: No edema.  Skin:    General: Skin is warm and dry.     Findings: No rash.  Neurological:     Mental Status: He is alert and oriented to person, place, and time. Mental status is at baseline.     Sensory: No sensory deficit.     Motor: No weakness.  Psychiatric:        Mood and Affect: Mood normal.        Lab Results  Component Value Date   WBC 7.1 01/20/2022   HGB 12.8 (L) 01/20/2022   HCT 36.9 (L) 01/20/2022   PLT 205 01/20/2022   GLUCOSE 104 (H) 01/20/2022   CHOL 143 01/19/2022   TRIG 68 01/19/2022   HDL 69 01/19/2022   LDLCALC 60 01/19/2022   ALT 17 01/20/2022   AST 21 01/20/2022   NA 127 (L) 01/20/2022   K 4.2 01/20/2022   CL 94 (L) 01/20/2022   CREATININE 1.00 01/20/2022   BUN 15  01/20/2022   CO2 25 01/20/2022   TSH 4.89 12/15/2020   PSA 2.12 11/06/2021   INR 1.0 01/18/2022   HGBA1C 5.4 01/18/2022   MR Lumbar Spine W Wo Contrast CLINICAL DATA:  Foot numbness  EXAM: MRI LUMBAR SPINE WITHOUT AND WITH CONTRAST  TECHNIQUE: Multiplanar and multiecho pulse sequences of the lumbar spine were obtained  without and with intravenous contrast.  CONTRAST:  41m GADAVIST GADOBUTROL 1 MMOL/ML IV SOLN  COMPARISON:  01/06/2008  FINDINGS: Segmentation:  Standard  Alignment: Grade 1 retrolisthesis at L2-3 and grade 1 anterolisthesis at L5-S1  Vertebrae:  Nonspecific bone marrow heterogeneity.  No acute lesion.  Conus medullaris and cauda equina: Conus extends to the L1 level. Conus and cauda equina appear normal.  Paraspinal and other soft tissues: Negative  Disc levels:  L1-L2: Normal disc space and facet joints. No spinal canal stenosis. No neural foraminal stenosis.  L2-L3: Bilateral laminectomies with improved patency of the spinal canal. No residual spinal canal stenosis. Unchanged mild bilateral neural foraminal stenosis.  L3-L4: Posterior decompression. New/worsened disc bulge. No spinal canal stenosis. Severe right and moderate left neural foraminal stenosis.  L4-L5: Posterior decompression. Disc bulge. No spinal canal stenosis. Severe right and mild left neural foraminal stenosis.  L5-S1: Posterior decompression. Facet arthrosis with anterolisthesis and disc uncovering. No spinal canal stenosis. Severe right and mild left neural foraminal stenosis.  Visualized sacrum: Normal.  IMPRESSION: 1. Status post L2-S1 posterior decompression with improved patency of the spinal canal. 2. Severe right L4 and L5 foraminal stenosis.  Electronically Signed   By: KUlyses JarredM.D.   On: 01/19/2022 20:57 ECHOCARDIOGRAM COMPLETE    ECHOCARDIOGRAM REPORT       Patient Name:   Kenneth WEHNERDate of Exam: 01/19/2022 Medical Rec #:  [id number]        Height:       68.0 in Accession #:    [id number]      Weight:       199.7 lb Date of Birth:  4May 02, 1942        BSA:          2.043 m Patient Age:    89years         BP:           141/88 mmHg Patient Gender: M                HR:           89 bpm. Exam Location:  Inpatient  Procedure: 2D Echo, Color Doppler, Cardiac Doppler and Intracardiac            Opacification Agent  Indications:    TIA   History:        Patient has prior history of Echocardiogram examinations, most                 recent 02/05/2021. Risk Factors:Hypertension and Dyslipidemia.   Sonographer:    ERaquel SarnaSenior RDCS Referring Phys: [id number] HillT TU    Sonographer Comments: Technically difficult study due to lung interference. IMPRESSIONS   1. Left ventricular ejection fraction, by estimation, is 60 to 65%. The left ventricle has normal function. The left ventricle has no regional wall motion abnormalities. There is mild left ventricular hypertrophy. Left ventricular diastolic parameters  are indeterminate.  2. Right ventricular systolic function is normal. The right ventricular size is normal. Tricuspid regurgitation signal is inadequate for assessing PA pressure.  3. Left atrial size was mildly dilated.  4. The mitral valve was not well visualized. No evidence of mitral valve regurgitation. No evidence of mitral stenosis.  5. The aortic valve was not well visualized. Aortic valve regurgitation is not visualized. No aortic stenosis is present.  6. The inferior vena cava is normal in size with greater than 50% respiratory variability, suggesting right atrial pressure  of 3 mmHg.  Comparison(s): No significant change from prior study.  Conclusion(s)/Recommendation(s): No intracardiac source of embolism detected on this transthoracic study. Consider a transesophageal echocardiogram to exclude cardiac source of embolism if clinically indicated. Normal wall motion with echo contrast.  Valves not well visualized but no  significant disease by Doppler.  FINDINGS  Left Ventricle: Left ventricular ejection fraction, by estimation, is 60 to 65%. The left ventricle has normal function. The left ventricle has no regional wall motion abnormalities. Definity contrast agent was given IV to delineate the left ventricular  endocardial borders. The left ventricular internal cavity size was normal in size. There is mild left ventricular hypertrophy. Left ventricular diastolic parameters are indeterminate.  Right Ventricle: The right ventricular size is normal. Right vetricular wall thickness was not well visualized. Right ventricular systolic function is normal. Tricuspid regurgitation signal is inadequate for assessing PA pressure.  Left Atrium: Left atrial size was mildly dilated.  Right Atrium: Right atrial size was normal in size.  Pericardium: Trivial pericardial effusion is present.  Mitral Valve: The mitral valve was not well visualized. No evidence of mitral valve regurgitation. No evidence of mitral valve stenosis.  Tricuspid Valve: The tricuspid valve is not well visualized. Tricuspid valve regurgitation is not demonstrated. No evidence of tricuspid stenosis.  Aortic Valve: The aortic valve was not well visualized. Aortic valve regurgitation is not visualized. No aortic stenosis is present.  Pulmonic Valve: The pulmonic valve was not well visualized. Pulmonic valve regurgitation is not visualized.  Aorta: The aortic root, ascending aorta, aortic arch and descending aorta are all structurally normal, with no evidence of dilitation or obstruction.  Venous: The inferior vena cava is normal in size with greater than 50% respiratory variability, suggesting right atrial pressure of 3 mmHg.  IAS/Shunts: The interatrial septum was not well visualized.    LEFT VENTRICLE PLAX 2D LVIDd:         3.70 cm   Diastology LVIDs:         2.80 cm   LV e' medial:    5.87 cm/s LV PW:         1.00 cm   LV E/e' medial:   11.1 LV IVS:        0.80 cm   LV e' lateral:   9.25 cm/s LVOT diam:     2.10 cm   LV E/e' lateral: 7.0 LV SV:         57 LV SV Index:   28 LVOT Area:     3.46 cm    RIGHT VENTRICLE RV S prime:     13.20 cm/s TAPSE (M-mode): 1.8 cm  LEFT ATRIUM             Index        RIGHT ATRIUM           Index LA diam:        3.20 cm 1.57 cm/m   RA Area:     15.00 cm LA Vol (A2C):   50.6 ml 24.75 ml/m  RA Volume:   36.00 ml  17.62 ml/m LA Vol (A4C):   60.6 ml 29.67 ml/m LA Biplane Vol: 62.6 ml 30.65 ml/m  AORTIC VALVE LVOT Vmax:   98.30 cm/s LVOT Vmean:  64.200 cm/s LVOT VTI:    0.166 m   AORTA Ao Root diam: 3.60 cm Ao Asc diam:  3.20 cm  MITRAL VALVE MV Area (PHT): 3.33 cm     SHUNTS MV Decel Time: 228 msec  Systemic VTI:  0.17 m MV E velocity: 65.00 cm/s   Systemic Diam: 2.10 cm MV A velocity: 126.00 cm/s MV E/A ratio:  0.52  Buford Dresser MD Electronically signed by Buford Dresser MD Signature Date/Time: 01/19/2022/1:07:15 PM      Final   MR BRAIN WO CONTRAST CLINICAL DATA:  Stroke follow-up  EXAM: MRI HEAD WITHOUT CONTRAST  TECHNIQUE: Multiplanar, multiecho pulse sequences of the brain and surrounding structures were obtained without intravenous contrast.  COMPARISON:  None Available.  FINDINGS: Brain: No acute infarct, mass effect or extra-axial collection. No acute hemorrhage. There is multifocal hyperintense T2-weighted signal within the white matter. Parenchymal volume and CSF spaces are normal. Old right occipital infarct with chronic blood products. The midline structures are normal. There is an old left cerebellar infarct.  Vascular: Major flow voids are preserved.  Skull and upper cervical spine: Normal calvarium and skull base. Visualized upper cervical spine and soft tissues are normal.  Sinuses/Orbits:No paranasal sinus fluid levels or advanced mucosal thickening. No mastoid or middle ear effusion. Normal  orbits.  IMPRESSION: 1. No acute intracranial abnormality. 2. Old right occipital and left cerebellar infarcts.  Electronically Signed   By: Ulyses Jarred M.D.   On: 01/19/2022 03:06    Assessment & Plan:    Lumbar spinal stenosis L4-5, severe: This was the cause of his transient right leg heaviness, weakness He has not had any recurrence of this It was advised that he follow-up with neurosurgery, but because he is asymptomatic he does not plan on doing this Discussed that it may be best to meet with him and discuss the spinal stenosis-recommended follow-up with neurosurgery At this point he will monitor for symptoms-did discuss with him that if his symptoms recur for or if he has any concerning symptoms in that leg he should consider seeing neurosurgery   History of CVA: He has had more than 2 strokes and he was not aware of that In the past he had refused wearing a Holter monitor to evaluate for atrial fibrillation Advised follow-up with Dr. Tomi Likens who he has seen in the past to review all of his recent studies and discussed preventing further stroke Continue Plavix 75 mg daily Continue atorvastatin 10 mg daily Blood pressure adequately controlled-continue current blood pressure medications Stressed compliance with his medication Encourage regular exercise, healthy diet  Hypertension: Chronic Blood pressure well controlled Continue amlodipine 10 mg daily, losartan 100 mg daily   Hyperlipidemia: Chronic Continue atorvastatin 10 mg daily LDL is at goal  SIADH: Chronic Stable

## 2022-01-25 ENCOUNTER — Encounter: Payer: Self-pay | Admitting: Internal Medicine

## 2022-01-25 ENCOUNTER — Ambulatory Visit (INDEPENDENT_AMBULATORY_CARE_PROVIDER_SITE_OTHER): Payer: Medicare Other | Admitting: Internal Medicine

## 2022-01-25 VITALS — BP 138/80 | HR 77 | Temp 98.0°F | Wt 200.0 lb

## 2022-01-25 DIAGNOSIS — E785 Hyperlipidemia, unspecified: Secondary | ICD-10-CM

## 2022-01-25 DIAGNOSIS — I10 Essential (primary) hypertension: Secondary | ICD-10-CM

## 2022-01-25 DIAGNOSIS — M48061 Spinal stenosis, lumbar region without neurogenic claudication: Secondary | ICD-10-CM

## 2022-01-25 NOTE — Patient Instructions (Addendum)
        Follow up with Dr Loretta Plume      Medications changes include :   none

## 2022-02-07 ENCOUNTER — Ambulatory Visit: Payer: Medicare Other

## 2022-02-07 ENCOUNTER — Other Ambulatory Visit: Payer: Self-pay | Admitting: Internal Medicine

## 2022-02-07 ENCOUNTER — Ambulatory Visit (INDEPENDENT_AMBULATORY_CARE_PROVIDER_SITE_OTHER): Payer: Medicare Other | Admitting: Podiatry

## 2022-02-07 ENCOUNTER — Encounter: Payer: Self-pay | Admitting: Podiatry

## 2022-02-07 ENCOUNTER — Telehealth: Payer: Self-pay | Admitting: Internal Medicine

## 2022-02-07 DIAGNOSIS — M722 Plantar fascial fibromatosis: Secondary | ICD-10-CM

## 2022-02-07 DIAGNOSIS — J301 Allergic rhinitis due to pollen: Secondary | ICD-10-CM

## 2022-02-07 MED ORDER — FLUTICASONE PROPIONATE 50 MCG/ACT NA SUSP: 2.0000 | Freq: Every day | NASAL | 1 refills | Status: DC

## 2022-02-07 MED ORDER — TRIAMCINOLONE ACETONIDE 10 MG/ML IJ SUSP
10.0000 mg | Freq: Once | INTRAMUSCULAR | Status: AC
  Administered 2022-02-07: 10 mg

## 2022-02-07 NOTE — Telephone Encounter (Signed)
Pt is requesting a refill of his fluticasone (FLONASE) 50 MCG/ACT nasal spray  Last OV 11-14-21  Please send to [place] ([facility])  Phone:  [phone]  Fax:  [phone]

## 2022-02-08 NOTE — Progress Notes (Signed)
Subjective:   Patient ID: Kenneth Montoya, male   DOB: 81 y.o.   MRN: [id number]   HPI Patient presents stating he is developed a lot of pain in the bottom of his left heel and that just has occurred over the last month   ROS      Objective:  Physical Exam  Neuro neurovascular status intact with inflammation pain of the plantar fascial band left and also has some plantar keratotic tissue formation     Assessment:  Inflammatory fasciitis left with patient also having lesion     Plan:  Sterile prep went ahead today and injected the fascia 3 mg dexamethasone Kenalog 5 mg Xylocaine debrided lesion iatrogenic bleeding reappoint as needed

## 2022-02-12 NOTE — Progress Notes (Deleted)
NEUROLOGY FOLLOW UP OFFICE NOTE  [name] [id number]  Assessment/Plan:   Recent TIA involving right lower extremity vs right L5 radiculopathy or compressive peroneal neuropathy.  History of right occipital lobe infarct, likely embolic of unknown source - I don't appreciate visual field defect on gross exam. Hypertension   Secondary stroke prevention: ASA '81mg'$  daily Restart atorvastatin at lower dose - '10mg'$  daily.  LDL goal less than 70.  Repeat lipid panel in 3 months. Hgb A1c goal less than 7 Normotensive blood pressure Agreeable to 30 day cardiac event monitor.  However, he would decline implantable loop recorder at this time. Mediterranean diet Follow up 6 months.       Subjective:  Kenneth Montoya is an 81 year old right-handed male with HTN, OA, SIADH and bilateral knee replacement who follows up for new TIA.  History supplemented by hospital records.  MRI of brain and lumbar spine and CTA of head and neck personally reviewed.  UPDATE: Current medications:  ***  Last seen in September following stroke.  Declined implantable loop recorder or extended cardiac event monitor.  ***  No residual symptoms from that stroke.  On 01/18/2022, he had transient right lower extremity weakness.  No pain or numbness.  Presented to [facility].  CT head ***.  MRI of brain revealed old right occipital and left cerebellar infarcts but no acute findings.  CTA head and neck revealed no LVO or hemodynamically significant stenosis.  2D echo showed EF 60-65%.  LDL was 60 and Hgb A1c 5.4.  History of lumbar surgery and questioned if foot drop was due to L5 radiculopathy or peroneal nerve impingement.  MRI of lumbar spine showed status post L2-S1 posterior decompression with improved patence but did reveal severe right L4 and L5 foraminal stenosis.  Advised to continue Plavix ***   HISTORY:  He was admitted to [facility] on 12/09/2020 for stroke. He presented with transient  left sided vision loss.  MRI of brain showed acute right medial occipital lobe infarct as well as extensive chronic small vessel ischemic changes including remote cortical infarct of the posterior right middle frontal gyrus.  MRA of head and neck showed severe distal left P2 and right P3 stenoses as well as 40-50% proximal right ICA stenosis.  LDL was 86 and Hgb A1c was 5.8.  He was found to be positive for COVID.  Echocardiogram was never performed as patient desired to be discharged.  He was discharged on dual antiplatelet therapy for 21 days followed by ASA alone as well as started on atorvastatin '40mg'$  daily.  Repeat LDL about a week later was 50.  However, he subsequently discontinued the atorvastatin because he felt dizzy and weak.  Outpatient 30 day cardiac event monitor was ordered but he has declined.  Still has a blind spot.  He has followed up with the eye doctor.  PAST MEDICAL HISTORY: Past Medical History:  Diagnosis Date   Arthritis    generalized   BPH (benign prostatic hyperplasia)    Cancer of skin, squamous cell    frozen    Cataract    bilateral sx   GERD (gastroesophageal reflux disease)    on meds   Hypertension    on meds   Shingles    Stroke [facility])     MEDICATIONS: Current Outpatient Medications on File Prior to Visit  Medication Sig Dispense Refill   acetaminophen (TYLENOL) 500 MG tablet Take 1,000 mg by mouth in the morning and  at bedtime.     amLODipine (NORVASC) 10 MG tablet TAKE 1 TABLET BY MOUTH AT  BEDTIME (Patient taking differently: Take 10 mg by mouth daily.) 90 tablet 3   ascorbic acid (VITAMIN C) 1000 MG tablet Take 1,000 mg by mouth at bedtime.     atorvastatin (LIPITOR) 10 MG tablet TAKE 1 TABLET(10 MG) BY MOUTH DAILY 90 tablet 1   clopidogrel (PLAVIX) 75 MG tablet TAKE 1 TABLET(75 MG) BY MOUTH DAILY (Patient taking differently: Take 75 mg by mouth daily.) 90 tablet 1   ferrous sulfate 324 MG TBEC Take 324 mg by mouth daily with breakfast.      fluticasone (FLONASE) 50 MCG/ACT nasal spray Place 2 sprays into both nostrils daily. 48 g 1   loratadine (CLARITIN) 10 MG tablet Take 10 mg by mouth daily.     losartan (COZAAR) 100 MG tablet TAKE 1 TABLET BY MOUTH DAILY (Patient taking differently: Take 100 mg by mouth daily.) 90 tablet 1   naproxen sodium (ALEVE) 220 MG tablet Take 440 mg by mouth 2 (two) times daily with a meal.     olopatadine (PATANOL) 0.1 % ophthalmic solution Place 1 drop into both eyes at bedtime.     omeprazole (PRILOSEC) 40 MG capsule TAKE 1 CAPSULE BY MOUTH  DAILY (Patient taking differently: Take 40 mg by mouth daily. TAKE 1 CAPSULE BY MOUTH  DAILY) 90 capsule 3   PREVIDENT 5000 PLUS 1.1 % CREA dental cream SMARTSIG:Application By Mouth     psyllium (METAMUCIL) 58.6 % powder Take 1 packet by mouth at bedtime.     tamsulosin (FLOMAX) 0.4 MG CAPS capsule Take 0.4 mg by mouth at bedtime.     No current facility-administered medications on file prior to visit.    ALLERGIES: No Known Allergies  FAMILY HISTORY: Family History  Problem Relation Age of Onset   Colon polyps Mother 52   Stroke Maternal Grandfather    Colon cancer Neg Hx    Esophageal cancer Neg Hx    Rectal cancer Neg Hx    Stomach cancer Neg Hx       Objective:  *** General: No acute distress.  Patient appears ***-groomed.   Head:  Normocephalic/atraumatic Eyes:  Fundi examined but not visualized Neck: supple, no paraspinal tenderness, full range of motion Heart:  Regular rate and rhythm Lungs:  Clear to auscultation bilaterally Back: No paraspinal tenderness Neurological Exam: alert and oriented to person, place, and time.  Speech fluent and not dysarthric, language intact.  CN II-XII intact. Bulk and tone normal, muscle strength 5/5 throughout.  Sensation to light touch intact.  Deep tendon reflexes 2+ throughout, toes downgoing.  Finger to nose testing intact.  Gait normal, Romberg negative.   Metta Clines, DO  CC: ***

## 2022-02-14 ENCOUNTER — Ambulatory Visit: Payer: 59 | Admitting: Neurology

## 2022-02-15 NOTE — Progress Notes (Unsigned)
NEUROLOGY FOLLOW UP OFFICE NOTE  Kenneth Montoya [id number]  Assessment/Plan:   Recent TIA involving right lower extremity vs right L5 radiculopathy or compressive peroneal neuropathy.  History of right occipital lobe infarct, likely embolic of unknown source - I don't appreciate visual field defect on gross exam. Hypertension   Secondary stroke prevention: ASA '81mg'$  daily Restart atorvastatin at lower dose - '10mg'$  daily.  LDL goal less than 70.  Repeat lipid panel in 3 months. Hgb A1c goal less than 7 Normotensive blood pressure Agreeable to 30 day cardiac event monitor.  However, he would decline implantable loop recorder at this time. Mediterranean diet Follow up 6 months.       Subjective:  Kenneth Montoya is an 81 year old right-handed male with HTN, OA, SIADH and bilateral knee replacement who follows up for new TIA.  History supplemented by hospital records.  MRI of brain and lumbar spine and CTA of head and neck personally reviewed.  UPDATE: Current medications:  ***  Last seen in September following stroke.  Declined implantable loop recorder or extended cardiac event monitor.  ***  No residual symptoms from that stroke.  On 01/18/2022, he had transient right lower extremity weakness.  No pain or numbness.  Presented to [facility].  CT head ***.  MRI of brain revealed old right occipital and left cerebellar infarcts but no acute findings.  CTA head and neck revealed no LVO or hemodynamically significant stenosis.  2D echo showed EF 60-65%.  LDL was 60 and Hgb A1c 5.4.  History of lumbar surgery and questioned if foot drop was due to L5 radiculopathy or peroneal nerve impingement.  MRI of lumbar spine showed status post L2-S1 posterior decompression with improved patence but did reveal severe right L4 and L5 foraminal stenosis.  Advised to continue Plavix ***   HISTORY:  He was admitted to [facility] on 12/09/2020 for stroke. He presented with transient  left sided vision loss.  MRI of brain showed acute right medial occipital lobe infarct as well as extensive chronic small vessel ischemic changes including remote cortical infarct of the posterior right middle frontal gyrus.  MRA of head and neck showed severe distal left P2 and right P3 stenoses as well as 40-50% proximal right ICA stenosis.  LDL was 86 and Hgb A1c was 5.8.  He was found to be positive for COVID.  Echocardiogram was never performed as patient desired to be discharged.  He was discharged on dual antiplatelet therapy for 21 days followed by ASA alone as well as started on atorvastatin '40mg'$  daily.  Repeat LDL about a week later was 50.  However, he subsequently discontinued the atorvastatin because he felt dizzy and weak.  Outpatient 30 day cardiac event monitor was ordered but he has declined.  Still has a blind spot.  He has followed up with the eye doctor.  PAST MEDICAL HISTORY: Past Medical History:  Diagnosis Date   Arthritis    generalized   BPH (benign prostatic hyperplasia)    Cancer of skin, squamous cell    frozen    Cataract    bilateral sx   GERD (gastroesophageal reflux disease)    on meds   Hypertension    on meds   Shingles    Stroke [facility])     MEDICATIONS: Current Outpatient Medications on File Prior to Visit  Medication Sig Dispense Refill   acetaminophen (TYLENOL) 500 MG tablet Take 1,000 mg by mouth in the morning and  at bedtime.     amLODipine (NORVASC) 10 MG tablet TAKE 1 TABLET BY MOUTH AT  BEDTIME (Patient taking differently: Take 10 mg by mouth daily.) 90 tablet 3   ascorbic acid (VITAMIN C) 1000 MG tablet Take 1,000 mg by mouth at bedtime.     atorvastatin (LIPITOR) 10 MG tablet TAKE 1 TABLET(10 MG) BY MOUTH DAILY 90 tablet 1   clopidogrel (PLAVIX) 75 MG tablet TAKE 1 TABLET(75 MG) BY MOUTH DAILY (Patient taking differently: Take 75 mg by mouth daily.) 90 tablet 1   ferrous sulfate 324 MG TBEC Take 324 mg by mouth daily with breakfast.      fluticasone (FLONASE) 50 MCG/ACT nasal spray Place 2 sprays into both nostrils daily. 48 g 1   loratadine (CLARITIN) 10 MG tablet Take 10 mg by mouth daily.     losartan (COZAAR) 100 MG tablet TAKE 1 TABLET BY MOUTH DAILY (Patient taking differently: Take 100 mg by mouth daily.) 90 tablet 1   naproxen sodium (ALEVE) 220 MG tablet Take 440 mg by mouth 2 (two) times daily with a meal.     olopatadine (PATANOL) 0.1 % ophthalmic solution Place 1 drop into both eyes at bedtime.     omeprazole (PRILOSEC) 40 MG capsule TAKE 1 CAPSULE BY MOUTH  DAILY (Patient taking differently: Take 40 mg by mouth daily. TAKE 1 CAPSULE BY MOUTH  DAILY) 90 capsule 3   PREVIDENT 5000 PLUS 1.1 % CREA dental cream SMARTSIG:Application By Mouth     psyllium (METAMUCIL) 58.6 % powder Take 1 packet by mouth at bedtime.     tamsulosin (FLOMAX) 0.4 MG CAPS capsule Take 0.4 mg by mouth at bedtime.     No current facility-administered medications on file prior to visit.    ALLERGIES: No Known Allergies  FAMILY HISTORY: Family History  Problem Relation Age of Onset   Colon polyps Mother 60   Stroke Maternal Grandfather    Colon cancer Neg Hx    Esophageal cancer Neg Hx    Rectal cancer Neg Hx    Stomach cancer Neg Hx       Objective:  *** General: No acute distress.  Patient appears well-groomed.   Head:  Normocephalic/atraumatic Eyes:  Fundi examined but not visualized Neck: supple, no paraspinal tenderness, full range of motion Heart:  Regular rate and rhythm Lungs:  Clear to auscultation bilaterally Back: No paraspinal tenderness Neurological Exam: alert and oriented to person, place, and time.  Speech fluent and not dysarthric, language intact.  CN II-XII intact. Bulk and tone normal, muscle strength 5/5 throughout.  Sensation to light touch intact.  Deep tendon reflexes 2+ throughout, toes downgoing.  Finger to nose testing intact.  Gait normal, Romberg negative.   Metta Clines, DO  CC: Scarlette Calico,  MD

## 2022-02-19 ENCOUNTER — Ambulatory Visit (INDEPENDENT_AMBULATORY_CARE_PROVIDER_SITE_OTHER): Payer: Medicare Other | Admitting: Neurology

## 2022-02-19 ENCOUNTER — Other Ambulatory Visit: Payer: Self-pay | Admitting: Neurology

## 2022-02-19 ENCOUNTER — Encounter: Payer: Self-pay | Admitting: Neurology

## 2022-02-19 VITALS — BP 123/68 | HR 79 | Ht 68.0 in | Wt 199.6 lb

## 2022-02-19 DIAGNOSIS — I4891 Unspecified atrial fibrillation: Secondary | ICD-10-CM

## 2022-02-19 DIAGNOSIS — G459 Transient cerebral ischemic attack, unspecified: Secondary | ICD-10-CM

## 2022-02-19 DIAGNOSIS — I10 Essential (primary) hypertension: Secondary | ICD-10-CM

## 2022-02-19 DIAGNOSIS — E785 Hyperlipidemia, unspecified: Secondary | ICD-10-CM

## 2022-02-19 DIAGNOSIS — I6300 Cerebral infarction due to thrombosis of unspecified precerebral artery: Secondary | ICD-10-CM

## 2022-02-19 DIAGNOSIS — I452 Bifascicular block: Secondary | ICD-10-CM

## 2022-02-19 NOTE — Patient Instructions (Signed)
Continue Plavix, atorvastatin and blood pressure medications Check 30 day cardiac event monitor Follow up 4-5 months

## 2022-02-21 ENCOUNTER — Telehealth: Payer: Self-pay | Admitting: Interventional Cardiology

## 2022-02-21 NOTE — Telephone Encounter (Signed)
This afternoon Mr Kenneth Montoya came in today and told me that his Neurologist; Dr Tomi Likens would like for him to wear a heart monitor to which he refuses to use,(stated this is a Medicare/Medicaid rouse/scheme, all about the money). He asked that someone look at his EKG from last September to tell him "that he is not in afib." I advised patient that no one could advise him on his EKG at this time because Dr Tamala Julian was not in office and I would forward his concerns to Dr Tamala Julian. I offered him an appt on 02/25/22 to come in an be seen by Nicholes Rough for this to which he accepted.

## 2022-02-22 ENCOUNTER — Other Ambulatory Visit: Payer: Self-pay | Admitting: Internal Medicine

## 2022-02-22 ENCOUNTER — Telehealth: Payer: Self-pay | Admitting: Internal Medicine

## 2022-02-22 DIAGNOSIS — I6300 Cerebral infarction due to thrombosis of unspecified precerebral artery: Secondary | ICD-10-CM

## 2022-02-22 DIAGNOSIS — I63423 Cerebral infarction due to embolism of bilateral anterior cerebral arteries: Secondary | ICD-10-CM

## 2022-02-22 NOTE — Progress Notes (Unsigned)
Office Visit    Patient Name: Kenneth Montoya Date of Encounter: 02/22/2022  PCP:  Janith Lima, MD   [facility]  Cardiologist:  Sinclair Grooms, MD  Advanced Practice Provider:  No care team member to display Electrophysiologist:  None   HPI    Kenneth Montoya is a 81 y.o. male with a past medical history significant for coronary artery calcification, hypertension, hyperlipidemia, carotid artery disease ([name] 4/21 with bilateral ICA 1 to 39%, MRI 7/22 right 40 to 50%), RBBB, erectile dysfunction, GERD, chronic hyponatremia (SIADH) presents today for annual follow-up.  Originally care was established with Dr. Tamala Julian in 10/21.  He was admitted with right occipital lobe stroke in 7/22.  Outpatient event monitor was recommended.  Of note, his COVID-19 test was positive.  There was a question of whether or not this was cardioembolic versus due to hypercoagulability in setting of COVID-19.  He was placed on dual antiplatelet therapy for 3 weeks and then aspirin alone.  He was admitted again 9/11 to 9/12 with balance issues.  MRI demonstrated bilateral frontal lobe infarcts.  There was significant concern for cardioembolic strokes.  He was placed on dual antiplatelet therapy with plans to continue Plavix alone for 3 weeks.  Echocardiogram demonstrated no evidence of cardiac thrombus.  He had not started on the monitor that was originally ordered.  A monitor was mailed to him after discharge.  He was seen by Richardson Dopp, PA-C 01/2021.  He was frustrated with his experience at [facility] at that time.  He decided that he was not going to wear cardiac monitor.  An appoint with Dr. Quentin Ore to discuss ILR implantation.  He does check his blood pressure at home and his readings were optimal at that time.  He denied chest pain, shortness of breath, and syncope.  He remains on ASA and clopidogrel.  Today he, ***  Past Medical History    Past Medical History:   Diagnosis Date   Arthritis    generalized   BPH (benign prostatic hyperplasia)    Cancer of skin, squamous cell    frozen    Cataract    bilateral sx   GERD (gastroesophageal reflux disease)    on meds   Hypertension    on meds   Shingles    Stroke [facility])    Past Surgical History:  Procedure Laterality Date   CATARACT EXTRACTION, BILATERAL     COLONOSCOPY  06/27/2020   COLONOSCOPY  2018   gum graft     INGUINAL HERNIA REPAIR     as infant   LUMBAR LAMINECTOMY  06/2015   POLYPECTOMY  2018   TA x 3   REVERSE TOTAL SHOULDER ARTHROPLASTY Bilateral    TONSILLECTOMY AND ADENOIDECTOMY     age 8    TOTAL KNEE ARTHROPLASTY Bilateral    TOTAL KNEE REVISION Right 12/25/2017   TRANSURETHRAL RESECTION OF PROSTATE     x 2   UPPER GASTROINTESTINAL ENDOSCOPY      Allergies  No Known Allergies  EKGs/Labs/Other Studies Reviewed:   The following studies were reviewed today:  ECHO COMPLETE WITH IMAGING ENHANCING AGENT 02/05/2021 No apical thrombus, EF 60-65, no RWMA, moderate LVH, GR 1 DD, mildly reduced RVSF   HEAD AND NECK MRA 12/09/20 IMPRESSION: MRA HEAD: 1. Negative intracranial MRA for large vessel occlusion. 2. Severe distal left P2 and right P3 stenoses. 3. Otherwise wide patency of the major intracranial arterial circulation. No  proximal high-grade or correctable stenosis. MRA NECK: 1. Short-segment moderate stenosis of up to 40-50% about the proximal right ICA. 2. No significant stenosis involving the left carotid artery system. 3. Both vertebral arteries widely patent within the neck. Right vertebral artery dominant.   VAS US CAROTID DUPLEX BILATERAL 08/26/2019 Bilateral ICA 1-39  EKG:  EKG is *** ordered today.  The ekg ordered today demonstrates ***  Recent Labs: 01/20/2022: ALT 17; BUN 15; Creatinine, Ser 1.00; Hemoglobin 12.8; Platelets 205; Potassium 4.2; Sodium 127  Recent Lipid Panel    Component Value Date/Time   CHOL 143 01/19/2022 0347   TRIG 68  01/19/2022 0347   HDL 69 01/19/2022 0347   CHOLHDL 2.1 01/19/2022 0347   VLDL 14 01/19/2022 0347   LDLCALC 60 01/19/2022 0347    Risk Assessment/Calculations:  {Does this patient have ATRIAL FIBRILLATION?[phone]}  Home Medications   No outpatient medications have been marked as taking for the 02/25/22 encounter (Appointment) with Elgie Collard, PA-C.     Review of Systems   ***   All other systems reviewed and are otherwise negative except as noted above.  Physical Exam    VS:  There were no vitals taken for this visit. , BMI There is no height or weight on file to calculate BMI.  Wt Readings from Last 3 Encounters:  02/19/22 199 lb 9.6 oz (90.5 kg)  01/25/22 200 lb (90.7 kg)  01/18/22 199 lb 11.8 oz (90.6 kg)     GEN: Well nourished, well developed, in no acute distress. HEENT: normal. Neck: Supple, no JVD, carotid bruits, or masses. Cardiac: ***RRR, no murmurs, rubs, or gallops. No clubbing, cyanosis, edema.  ***Radials/PT 2+ and equal bilaterally.  Respiratory:  ***Respirations regular and unlabored, clear to auscultation bilaterally. GI: Soft, nontender, nondistended. MS: No deformity or atrophy. Skin: Warm and dry, no rash. Neuro:  Strength and sensation are intact. Psych: Normal affect.  Assessment & Plan    History of Stroke -Thought to possibly be cardioembolic, appointment with Dr. Quentin Ore -ILR implantation? Essential hypertension Coronary atherosclerosis Hyperlipidemia Carotid artery stenosis  No BP recorded.  {Refresh Note OR Click here to enter BP  :1}***      Disposition: Follow up {follow up:15908} with Sinclair Grooms, MD or APP.  Signed, Elgie Collard, PA-C 02/22/2022, 10:15 AM [place]

## 2022-02-22 NOTE — Telephone Encounter (Signed)
Rx was sent by PCP.  

## 2022-02-22 NOTE — Telephone Encounter (Signed)
Paitent needs his plavix -   please send to [address]

## 2022-02-25 ENCOUNTER — Ambulatory Visit: Payer: Medicare Other | Attending: Physician Assistant | Admitting: Physician Assistant

## 2022-02-25 ENCOUNTER — Encounter: Payer: Self-pay | Admitting: Physician Assistant

## 2022-02-25 VITALS — BP 132/70 | HR 71 | Ht 68.0 in | Wt 200.8 lb

## 2022-02-25 DIAGNOSIS — I6523 Occlusion and stenosis of bilateral carotid arteries: Secondary | ICD-10-CM | POA: Diagnosis present

## 2022-02-25 DIAGNOSIS — Z8673 Personal history of transient ischemic attack (TIA), and cerebral infarction without residual deficits: Secondary | ICD-10-CM

## 2022-02-25 DIAGNOSIS — I10 Essential (primary) hypertension: Secondary | ICD-10-CM

## 2022-02-25 DIAGNOSIS — I2584 Coronary atherosclerosis due to calcified coronary lesion: Secondary | ICD-10-CM | POA: Diagnosis present

## 2022-02-25 DIAGNOSIS — I2510 Atherosclerotic heart disease of native coronary artery without angina pectoris: Secondary | ICD-10-CM | POA: Diagnosis present

## 2022-02-25 DIAGNOSIS — E785 Hyperlipidemia, unspecified: Secondary | ICD-10-CM

## 2022-02-25 MED ORDER — HYDROCHLOROTHIAZIDE 12.5 MG PO CAPS: 12.5000 mg | ORAL_CAPSULE | Freq: Every day | ORAL | 3 refills | Status: DC

## 2022-02-25 NOTE — Patient Instructions (Signed)
Medication Instructions:  1.Start hydrochlorothiazide (HCTZ) 12.5 mg daily *If you need a refill on your cardiac medications before your next appointment, please call your pharmacy*  Lab Work: None ordered If you have labs (blood work) drawn today and your tests are completely normal, you will receive your results only by: [place] (if you have MyChart) OR A paper copy in the mail If you have any lab test that is abnormal or we need to change your treatment, we will call you to review the results.  Follow-Up: At [facility], you and your health needs are our priority.  As part of our continuing mission to provide you with exceptional heart care, we have created designated Provider Care Teams.  These Care Teams include your primary Cardiologist (physician) and Advanced Practice Providers (APPs -  Physician Assistants and Nurse Practitioners) who all work together to provide you with the care you need, when you need it.   Your next appointment:   2-3 months with an EKG  The format for your next appointment:   In Person  Provider:   Sinclair Grooms, MD   Important Information About Sugar

## 2022-03-04 ENCOUNTER — Ambulatory Visit (INDEPENDENT_AMBULATORY_CARE_PROVIDER_SITE_OTHER): Payer: Medicare Other

## 2022-03-04 VITALS — Ht 68.0 in | Wt 201.0 lb

## 2022-03-04 DIAGNOSIS — Z0000 Encounter for general adult medical examination without abnormal findings: Secondary | ICD-10-CM | POA: Diagnosis not present

## 2022-03-04 NOTE — Patient Instructions (Addendum)
[name] , Thank you for taking time to come for your Medicare Wellness Visit. I appreciate your ongoing commitment to your health goals. Please review the following plan we discussed and let me know if I can assist you in the future.   These are the goals we discussed:  Goals      My goal is to lose 12-13 pounds.        This is a list of the screening recommended for you and due dates:  Health Maintenance  Topic Date Due   Tetanus Vaccine  05/27/2022   Colon Cancer Screening  06/27/2025   Pneumonia Vaccine  Completed   Flu Shot  Completed   Zoster (Shingles) Vaccine  Completed   HPV Vaccine  Aged Out   COVID-19 Vaccine  Discontinued    Advanced directives: YES  Conditions/risks identified: YES  Next appointment: 03/06/2023 at 3:00 p.m. telephone visit with Mignon Pine, Nurse Health Advisor.  If you need to reschedule or cancel, please call [phone].  Preventive Care 56 Years and Older, Male  Preventive care refers to lifestyle choices and visits with your health care provider that can promote health and wellness. What does preventive care include? A yearly physical exam. This is also called an annual well check. Dental exams once or twice a year. Routine eye exams. Ask your health care provider how often you should have your eyes checked. Personal lifestyle choices, including: Daily care of your teeth and gums. Regular physical activity. Eating a healthy diet. Avoiding tobacco and drug use. Limiting alcohol use. Practicing safe sex. Taking low doses of aspirin every day. Taking vitamin and mineral supplements as recommended by your health care provider. What happens during an annual well check? The services and screenings done by your health care provider during your annual well check will depend on your age, overall health, lifestyle risk factors, and family history of disease. Counseling  Your health care provider may ask you questions about your: Alcohol  use. Tobacco use. Drug use. Emotional well-being. Home and relationship well-being. Sexual activity. Eating habits. History of falls. Memory and ability to understand (cognition). Work and work Statistician. Screening  You may have the following tests or measurements: Height, weight, and BMI. Blood pressure. Lipid and cholesterol levels. These may be checked every 5 years, or more frequently if you are over 60 years old. Skin check. Lung cancer screening. You may have this screening every year starting at age 41 if you have a 30-pack-year history of smoking and currently smoke or have quit within the past 15 years. Fecal occult blood test (FOBT) of the stool. You may have this test every year starting at age 22. Flexible sigmoidoscopy or colonoscopy. You may have a sigmoidoscopy every 5 years or a colonoscopy every 10 years starting at age 72. Prostate cancer screening. Recommendations will vary depending on your family history and other risks. Hepatitis C blood test. Hepatitis B blood test. Sexually transmitted disease (STD) testing. Diabetes screening. This is done by checking your blood sugar (glucose) after you have not eaten for a while (fasting). You may have this done every 1-3 years. Abdominal aortic aneurysm (AAA) screening. You may need this if you are a current or former smoker. Osteoporosis. You may be screened starting at age 49 if you are at high risk. Talk with your health care provider about your test results, treatment options, and if necessary, the need for more tests. Vaccines  Your health care provider may recommend certain vaccines, such as: Influenza  vaccine. This is recommended every year. Tetanus, diphtheria, and acellular pertussis (Tdap, Td) vaccine. You may need a Td booster every 10 years. Zoster vaccine. You may need this after age 42. Pneumococcal 13-valent conjugate (PCV13) vaccine. One dose is recommended after age 77. Pneumococcal polysaccharide  (PPSV23) vaccine. One dose is recommended after age 13. Talk to your health care provider about which screenings and vaccines you need and how often you need them. This information is not intended to replace advice given to you by your health care provider. Make sure you discuss any questions you have with your health care provider. Document Released: 06/09/2015 Document Revised: 01/31/2016 Document Reviewed: 03/14/2015 Elsevier Interactive Patient Education  2017 [place] Prevention in the Home Falls can cause injuries. They can happen to people of all ages. There are many things you can do to make your home safe and to help prevent falls. What can I do on the outside of my home? Regularly fix the edges of walkways and driveways and fix any cracks. Remove anything that might make you trip as you walk through a door, such as a raised step or threshold. Trim any bushes or trees on the path to your home. Use bright outdoor lighting. Clear any walking paths of anything that might make someone trip, such as rocks or tools. Regularly check to see if handrails are loose or broken. Make sure that both sides of any steps have handrails. Any raised decks and porches should have guardrails on the edges. Have any leaves, snow, or ice cleared regularly. Use sand or salt on walking paths during winter. Clean up any spills in your garage right away. This includes oil or grease spills. What can I do in the bathroom? Use night lights. Install grab bars by the toilet and in the tub and shower. Do not use towel bars as grab bars. Use non-skid mats or decals in the tub or shower. If you need to sit down in the shower, use a plastic, non-slip stool. Keep the floor dry. Clean up any water that spills on the floor as soon as it happens. Remove soap buildup in the tub or shower regularly. Attach bath mats securely with double-sided non-slip rug tape. Do not have throw rugs and other things on the  floor that can make you trip. What can I do in the bedroom? Use night lights. Make sure that you have a light by your bed that is easy to reach. Do not use any sheets or blankets that are too big for your bed. They should not hang down onto the floor. Have a firm chair that has side arms. You can use this for support while you get dressed. Do not have throw rugs and other things on the floor that can make you trip. What can I do in the kitchen? Clean up any spills right away. Avoid walking on wet floors. Keep items that you use a lot in easy-to-reach places. If you need to reach something above you, use a strong step stool that has a grab bar. Keep electrical cords out of the way. Do not use floor polish or wax that makes floors slippery. If you must use wax, use non-skid floor wax. Do not have throw rugs and other things on the floor that can make you trip. What can I do with my stairs? Do not leave any items on the stairs. Make sure that there are handrails on both sides of the stairs and use them. Fix  handrails that are broken or loose. Make sure that handrails are as long as the stairways. Check any carpeting to make sure that it is firmly attached to the stairs. Fix any carpet that is loose or worn. Avoid having throw rugs at the top or bottom of the stairs. If you do have throw rugs, attach them to the floor with carpet tape. Make sure that you have a light switch at the top of the stairs and the bottom of the stairs. If you do not have them, ask someone to add them for you. What else can I do to help prevent falls? Wear shoes that: Do not have high heels. Have rubber bottoms. Are comfortable and fit you well. Are closed at the toe. Do not wear sandals. If you use a stepladder: Make sure that it is fully opened. Do not climb a closed stepladder. Make sure that both sides of the stepladder are locked into place. Ask someone to hold it for you, if possible. Clearly mark and make  sure that you can see: Any grab bars or handrails. First and last steps. Where the edge of each step is. Use tools that help you move around (mobility aids) if they are needed. These include: Canes. Walkers. Scooters. Crutches. Turn on the lights when you go into a dark area. Replace any light bulbs as soon as they burn out. Set up your furniture so you have a clear path. Avoid moving your furniture around. If any of your floors are uneven, fix them. If there are any pets around you, be aware of where they are. Review your medicines with your doctor. Some medicines can make you feel dizzy. This can increase your chance of falling. Ask your doctor what other things that you can do to help prevent falls. This information is not intended to replace advice given to you by your health care provider. Make sure you discuss any questions you have with your health care provider. Document Released: 03/09/2009 Document Revised: 10/19/2015 Document Reviewed: 06/17/2014 Elsevier Interactive Patient Education  2017 [employer].

## 2022-03-04 NOTE — Progress Notes (Signed)
Virtual Visit via Telephone Note  I connected with  Kenneth Montoya on 03/04/22 at  3:15 PM EDT by telephone and verified that I am speaking with the correct person using two identifiers.  Location: Patient: HOME Provider: [facility] Persons participating in the virtual visit: patient/Nurse Health Advisor   I discussed the limitations, risks, security and privacy concerns of performing an evaluation and management service by telephone and the availability of in person appointments. The patient expressed understanding and agreed to proceed.  Interactive audio and video telecommunications were attempted between this nurse and patient, however failed, due to patient having technical difficulties OR patient did not have access to video capability.  We continued and completed visit with audio only.  Some vital signs may be absent or patient reported.   Sheral Flow, LPN  Subjective:   Kenneth Montoya is a 81 y.o. male who presents for Medicare Annual/Subsequent preventive examination.  Review of Systems     Cardiac Risk Factors include: advanced age (>35mn, >>30women);dyslipidemia;family history of premature cardiovascular disease;hypertension;male gender;obesity (BMI >30kg/m2)     Objective:    Today's Vitals   03/04/22 1517  Weight: 201 lb (91.2 kg)  Height: '5\' 8"'$  (1.727 m)  PainSc: 0-No pain   Body mass index is 30.56 kg/m.     03/04/2022    3:22 PM 02/27/2021   11:24 AM 12/09/2020    1:10 PM 07/10/2019   12:45 PM  Advanced Directives  Does Patient Have a Medical Advance Directive? Yes Yes No No  Type of AParamed[place]of [place] will Living will;Healthcare Power of Attorney    Does patient want to make changes to medical advance directive?  No - Patient declined    Copy of [place]? No - copy requested No - copy requested    Would patient like information on creating a medical advance directive?   No  - Patient declined No - Patient declined    Current Medications (verified) Outpatient Encounter Medications as of 03/04/2022  Medication Sig   acetaminophen (TYLENOL) 500 MG tablet Take 1,000 mg by mouth in the morning and at bedtime.   amLODipine (NORVASC) 10 MG tablet TAKE 1 TABLET BY MOUTH AT  BEDTIME (Patient taking differently: Take 10 mg by mouth daily.)   ascorbic acid (VITAMIN C) 1000 MG tablet Take 1,000 mg by mouth at bedtime.   atorvastatin (LIPITOR) 10 MG tablet TAKE 1 TABLET(10 MG) BY MOUTH DAILY   clopidogrel (PLAVIX) 75 MG tablet Take 1 tablet (75 mg total) by mouth daily.   ferrous sulfate 324 MG TBEC Take 324 mg by mouth daily with breakfast.   fluticasone (FLONASE) 50 MCG/ACT nasal spray Place 2 sprays into both nostrils daily.   hydrochlorothiazide (MICROZIDE) 12.5 MG capsule Take 1 capsule (12.5 mg total) by mouth daily.   loratadine (CLARITIN) 10 MG tablet Take 10 mg by mouth daily.   losartan (COZAAR) 100 MG tablet TAKE 1 TABLET BY MOUTH DAILY (Patient taking differently: Take 100 mg by mouth daily.)   Melatonin 3 MG SUBL Place under the tongue. Daily at bedtime   naproxen sodium (ALEVE) 220 MG tablet Take 440 mg by mouth 2 (two) times daily with a meal.   olopatadine (PATANOL) 0.1 % ophthalmic solution Place 1 drop into both eyes at bedtime.   omeprazole (PRILOSEC) 40 MG capsule TAKE 1 CAPSULE BY MOUTH  DAILY (Patient taking differently: Take 40 mg by mouth daily. TAKE 1 CAPSULE BY  MOUTH  DAILY)   PREVIDENT 5000 PLUS 1.1 % CREA dental cream SMARTSIG:Application By Mouth   psyllium (METAMUCIL) 58.6 % powder Take 1 packet by mouth at bedtime.   tamsulosin (FLOMAX) 0.4 MG CAPS capsule Take 0.4 mg by mouth at bedtime.   traZODone (DESYREL) 50 MG tablet Take 50 mg by mouth at bedtime.   No facility-administered encounter medications on file as of 03/04/2022.    Allergies (verified) Patient has no known allergies.   History: Past Medical History:  Diagnosis Date    Arthritis    generalized   BPH (benign prostatic hyperplasia)    Cancer of skin, squamous cell    frozen    Cataract    bilateral sx   GERD (gastroesophageal reflux disease)    on meds   Hypertension    on meds   Shingles    Stroke [facility])    Past Surgical History:  Procedure Laterality Date   CATARACT EXTRACTION, BILATERAL     COLONOSCOPY  06/27/2020   COLONOSCOPY  2018   gum graft     INGUINAL HERNIA REPAIR     as infant   LUMBAR LAMINECTOMY  06/2015   POLYPECTOMY  2018   TA x 3   REVERSE TOTAL SHOULDER ARTHROPLASTY Bilateral    TONSILLECTOMY AND ADENOIDECTOMY     age 75    TOTAL KNEE ARTHROPLASTY Bilateral    TOTAL KNEE REVISION Right 12/25/2017   TRANSURETHRAL RESECTION OF PROSTATE     x 2   UPPER GASTROINTESTINAL ENDOSCOPY     Family History  Problem Relation Age of Onset   Colon polyps Mother 52   Stroke Maternal Grandfather    Colon cancer Neg Hx    Esophageal cancer Neg Hx    Rectal cancer Neg Hx    Stomach cancer Neg Hx    Social History   Socioeconomic History   Marital status: Married    Spouse name: Not on file   Number of children: Not on file   Years of education: Not on file   Highest education level: Not on file  Occupational History   Not on file  Tobacco Use   Smoking status: Never   Smokeless tobacco: Never  Vaping Use   Vaping Use: Never used  Substance and Sexual Activity   Alcohol use: Yes    Alcohol/week: 2.0 standard drinks of alcohol    Types: 2 Standard drinks or equivalent per week    Comment: wine 1-2 x a week and not every week    Drug use: Never   Sexual activity: Not on file  Other Topics Concern   Not on file  Social History Narrative   Left handed   Social Determinants of Health   Financial Resource Strain: Low Risk  (03/04/2022)   Overall Financial Resource Strain (CARDIA)    Difficulty of Paying Living Expenses: Not hard at all  Food Insecurity: No Food Insecurity (03/04/2022)   Hunger Vital Sign    Worried  About Running Out of Food in the Last Year: Never true    Ran Out of Food in the Last Year: Never true  Transportation Needs: No Transportation Needs (03/04/2022)   PRAPARE - Hydrologist (Medical): No    Lack of Transportation (Non-Medical): No  Physical Activity: Sufficiently Active (03/04/2022)   Exercise Vital Sign    Days of Exercise per Week: 7 days    Minutes of Exercise per Session: 30 min  Stress: No Stress  Concern Present (03/04/2022)   Intercourse    Feeling of Stress : Not at all  Social Connections: [place] (03/04/2022)   Social Connection and Isolation Panel [NHANES]    Frequency of Communication with Friends and Family: More than three times a week    Frequency of Social Gatherings with Friends and Family: More than three times a week    Attends Religious Services: More than 4 times per year    Active Member of [employer] or Organizations: Yes    Attends Music therapist: More than 4 times per year    Marital Status: Married    Tobacco Counseling Counseling given: Not Answered   Clinical Intake:  Pre-visit preparation completed: Yes  Pain : No/denies pain Pain Score: 0-No pain     BMI - recorded: 30.56 Nutritional Status: BMI > 30  Obese Nutritional Risks: None Diabetes: No  How often do you need to have someone help you when you read instructions, pamphlets, or other written materials from your doctor or pharmacy?: 1 - Never What is the last grade level you completed in school?: HSG  Diabetic? NO  Interpreter Needed?: No  Information entered by :: Lisette Abu, LPN.   Activities of Daily Living    03/04/2022    3:28 PM  In your present state of health, do you have any difficulty performing the following activities:  Hearing? 0  Vision? 0  Difficulty concentrating or making decisions? 0  Walking or climbing stairs? 0  Dressing or  bathing? 0  Doing errands, shopping? 0  Preparing Food and eating ? N  Using the Toilet? N  In the past six months, have you accidently leaked urine? N  Do you have problems with loss of bowel control? N  Managing your Medications? N  Managing your Finances? N  Housekeeping or managing your Housekeeping? N    Patient Care Team: Janith Lima, MD as PCP - General (Internal Medicine) Belva Crome, MD as PCP - Cardiology (Cardiology) [place], Heather, [place] as Consulting Physician (Optometry)  Indicate any recent Medical Services you may have received from other than [facility] providers in the past year (date may be approximate).     Assessment:   This is a routine wellness examination for Kenneth Montoya.  Hearing/Vision screen Hearing Screening - Comments:: Denies hearing difficulties.   Vision Screening - Comments:: Wears rx glasses - up to date with routine eye exams with [facility].   Dietary issues and exercise activities discussed: Current Exercise Habits: Structured exercise class, Type of exercise: walking;treadmill;strength training/weights, Time (Minutes): 60, Frequency (Times/Week): 5, Weekly Exercise (Minutes/Week): 300, Intensity: Moderate   Goals Addressed             This Visit's Progress    My goal is to lose 12-13 pounds.        Depression Screen    03/04/2022    3:21 PM 01/25/2022    3:23 PM 07/16/2021    9:45 AM 05/14/2021    9:45 AM 02/27/2021   11:22 AM 09/20/2020    4:06 PM  PHQ 2/9 Scores  PHQ - 2 Score 0 0 0 0 0 0  PHQ- 9 Score 0 0        Fall Risk    03/04/2022    3:24 PM 02/19/2022   10:47 AM 01/25/2022    3:22 PM 11/19/2021   11:30 AM 07/16/2021    9:45 AM  Fall Risk  Falls in the past year? 0 0  0 0  Comment    denies new/ recent falls x 12 months; does not use assistive devices   Number falls in past yr: 0 0 0 0 0  Injury with Fall? 0 0 0 0 0  Risk for fall due to : No Fall Risks  No Fall Risks Medication side effect No Fall Risks  Follow up  Falls prevention discussed  Falls evaluation completed Falls prevention discussed Falls prevention discussed  Comment     Reports does not use assistive devices    FALL RISK PREVENTION PERTAINING TO THE HOME:  Any stairs in or around the home? No  If so, are there any without handrails? No  Home free of loose throw rugs in walkways, pet beds, electrical cords, etc? Yes  Adequate lighting in your home to reduce risk of falls? Yes   ASSISTIVE DEVICES UTILIZED TO PREVENT FALLS:  Life alert? No  Use of a cane, walker or w/c? No  Grab bars in the bathroom? Yes  Shower chair or bench in shower? No  Elevated toilet seat or a handicapped toilet? Yes   TIMED UP AND GO:  Was the test performed? No . PHONE VISIT   Cognitive Function:        03/04/2022    3:35 PM  6CIT Screen  What Year? 0 points  What month? 0 points  What time? 0 points  Count back from 20 0 points  Months in reverse 0 points  Repeat phrase 0 points  Total Score 0 points    Immunizations Immunization History  Administered Date(s) Administered   Influenza,inj,quad, With Preservative 03/29/2017, 03/31/2018, 03/08/2019   Influenza-Unspecified 02/26/2021   PFIZER(Purple Top)SARS-COV-2 Vaccination 06/17/2019, 06/28/2019, 02/29/2020, 10/29/2020, 02/20/2021   PNEUMOCOCCAL CONJUGATE-20 11/13/2020   Td 05/27/2012   Td (Adult) 05/27/2012   Zoster Recombinat (Shingrix) 10/29/2020, 12/29/2020   Zoster, Live 02/25/2015    TDAP status: Due, Education has been provided regarding the importance of this vaccine. Advised may receive this vaccine at local pharmacy or Health Dept. Aware to provide a copy of the vaccination record if obtained from local pharmacy or Health Dept. Verbalized acceptance and understanding.  Flu Vaccine status: Up to date  Pneumococcal vaccine status: Up to date  Covid-19 vaccine status: Completed vaccines  Qualifies for Shingles Vaccine? Yes   Zostavax completed Yes   Shingrix Completed?:  Yes  Screening Tests Health Maintenance  Topic Date Due   INFLUENZA VACCINE  08/25/2022 (Originally 12/25/2021)   TETANUS/TDAP  05/27/2022   COLONOSCOPY (Pts 45-86yr Insurance coverage will need to be confirmed)  06/27/2025   Pneumonia Vaccine 81 Years old  Completed   Zoster Vaccines- Shingrix  Completed   HPV VACCINES  Aged Out   COVID-19 Vaccine  Discontinued    Health Maintenance  There are no preventive care reminders to display for this patient.  Colorectal cancer screening: Type of screening: Colonoscopy. Completed 06/27/2020. Repeat every 5 years  Lung Cancer Screening: (Low Dose CT Chest recommended if Age 82-80years, 30 pack-year currently smoking OR have quit w/in 15years.) does not qualify.   Lung Cancer Screening Referral: NO  Additional Screening:  Hepatitis C Screening: does not qualify; Completed NO  Vision Screening: Recommended annual ophthalmology exams for early detection of glaucoma and other disorders of the eye. Is the patient up to date with their annual eye exam?  Yes  Who is the provider or what is the name of the office in which the  patient attends annual eye exams? [facility] [place], [place]. If pt is not established with a provider, would they like to be referred to a provider to establish care? No .   Dental Screening: Recommended annual dental exams for proper oral hygiene  Community Resource Referral / Chronic Care Management: CRR required this visit?  No   CCM required this visit?  No      Plan:     I have personally reviewed and noted the following in the patient's chart:   Medical and social history Use of alcohol, tobacco or illicit drugs  Current medications and supplements including opioid prescriptions. Patient is not currently taking opioid prescriptions. Functional ability and status Nutritional status Physical activity Advanced directives List of other physicians Hospitalizations, surgeries, and ER visits in previous 12  months Vitals Screenings to include cognitive, depression, and falls Referrals and appointments  In addition, I have reviewed and discussed with patient certain preventive protocols, quality metrics, and best practice recommendations. A written personalized care plan for preventive services as well as general preventive health recommendations were provided to patient.     Sheral Flow, LPN   [id number]   Nurse Notes: N/A

## 2022-04-28 NOTE — Progress Notes (Unsigned)
    Subjective:    Patient ID: Kenneth Montoya, male    DOB: Dec 04, 1940, 81 y.o.   MRN: [id number]      HPI Fahed is here for No chief complaint on file.        Medications and allergies reviewed with patient and updated if appropriate.  Current Outpatient Medications on File Prior to Visit  Medication Sig Dispense Refill   acetaminophen (TYLENOL) 500 MG tablet Take 1,000 mg by mouth in the morning and at bedtime.     amLODipine (NORVASC) 10 MG tablet TAKE 1 TABLET BY MOUTH AT  BEDTIME (Patient taking differently: Take 10 mg by mouth daily.) 90 tablet 3   ascorbic acid (VITAMIN C) 1000 MG tablet Take 1,000 mg by mouth at bedtime.     atorvastatin (LIPITOR) 10 MG tablet TAKE 1 TABLET(10 MG) BY MOUTH DAILY 90 tablet 1   clopidogrel (PLAVIX) 75 MG tablet Take 1 tablet (75 mg total) by mouth daily. 90 tablet 1   ferrous sulfate 324 MG TBEC Take 324 mg by mouth daily with breakfast.     fluticasone (FLONASE) 50 MCG/ACT nasal spray Place 2 sprays into both nostrils daily. 48 g 1   hydrochlorothiazide (MICROZIDE) 12.5 MG capsule Take 1 capsule (12.5 mg total) by mouth daily. 90 capsule 3   loratadine (CLARITIN) 10 MG tablet Take 10 mg by mouth daily.     losartan (COZAAR) 100 MG tablet TAKE 1 TABLET BY MOUTH DAILY (Patient taking differently: Take 100 mg by mouth daily.) 90 tablet 1   Melatonin 3 MG SUBL Place under the tongue. Daily at bedtime     naproxen sodium (ALEVE) 220 MG tablet Take 440 mg by mouth 2 (two) times daily with a meal.     olopatadine (PATANOL) 0.1 % ophthalmic solution Place 1 drop into both eyes at bedtime.     omeprazole (PRILOSEC) 40 MG capsule TAKE 1 CAPSULE BY MOUTH  DAILY (Patient taking differently: Take 40 mg by mouth daily. TAKE 1 CAPSULE BY MOUTH  DAILY) 90 capsule 3   PREVIDENT 5000 PLUS 1.1 % CREA dental cream SMARTSIG:Application By Mouth     psyllium (METAMUCIL) 58.6 % powder Take 1 packet by mouth at bedtime.     tamsulosin (FLOMAX) 0.4 MG CAPS  capsule Take 0.4 mg by mouth at bedtime.     traZODone (DESYREL) 50 MG tablet Take 50 mg by mouth at bedtime.     No current facility-administered medications on file prior to visit.    Review of Systems     Objective:  There were no vitals filed for this visit. BP Readings from Last 3 Encounters:  02/25/22 132/70  02/19/22 123/68  01/25/22 138/80   Wt Readings from Last 3 Encounters:  03/04/22 201 lb (91.2 kg)  02/25/22 200 lb 12.8 oz (91.1 kg)  02/19/22 199 lb 9.6 oz (90.5 kg)   There is no height or weight on file to calculate BMI.    Physical Exam         Assessment & Plan:    See Problem List for Assessment and Plan of chronic medical problems.

## 2022-04-29 ENCOUNTER — Ambulatory Visit (INDEPENDENT_AMBULATORY_CARE_PROVIDER_SITE_OTHER): Payer: Medicare Other | Admitting: Internal Medicine

## 2022-04-29 ENCOUNTER — Encounter: Payer: Self-pay | Admitting: Internal Medicine

## 2022-04-29 VITALS — BP 118/70 | HR 77 | Temp 98.4°F | Ht 68.0 in | Wt 205.0 lb

## 2022-04-29 DIAGNOSIS — I6523 Occlusion and stenosis of bilateral carotid arteries: Secondary | ICD-10-CM | POA: Diagnosis not present

## 2022-04-29 DIAGNOSIS — E669 Obesity, unspecified: Secondary | ICD-10-CM | POA: Diagnosis not present

## 2022-04-29 DIAGNOSIS — E66811 Obesity, class 1: Secondary | ICD-10-CM

## 2022-04-29 NOTE — Patient Instructions (Signed)
      Start weight watchers again to help with weight loss

## 2022-04-29 NOTE — Progress Notes (Unsigned)
Cardiology Office Note:    Date:  04/30/2022   ID:  [name], DOB July 03, 1940, MRN [id number]  PCP:  Janith Lima, MD  Cardiologist:  Sinclair Grooms, MD   Referring MD: Janith Lima, MD   Chief Complaint  Patient presents with   Congestive Heart Failure   Coronary Artery Disease   Hospitalization Follow-up    Abnormal ECG    History of Present Illness:    Kenneth Montoya is a 81 y.o. male with a hx of  coronary artery calcification, hypertension, hyperlipidemia, carotid artery disease ([name] 4/21 with bilateral ICA 1 to 39%, MRI 7/22 right 40 to 50%), occipital CVA 02/2021 TIA, and bilateral frontal infarcts, RBBB with left anterior hemiblock, erectile dysfunction, GERD, and chronic hyponatremia (SIADH).  Never saw Quentin Ore / EP for ILR and also encouraged to wear a monitor but refused.  No new or recent neurological symptoms.  No palpitations or episodes of syncope/rapid heart beating to suggest atrial fibrillation.  Has refused long-term monitoring to identify A-fib.  Denies angina.  No orthopnea, PND, or signs/symptoms of heart failure.  Past Medical History:  Diagnosis Date   Arthritis    generalized   BPH (benign prostatic hyperplasia)    Cancer of skin, squamous cell    frozen    Cataract    bilateral sx   GERD (gastroesophageal reflux disease)    on meds   Hypertension    on meds   Shingles    Stroke [facility])     Past Surgical History:  Procedure Laterality Date   CATARACT EXTRACTION, BILATERAL     COLONOSCOPY  06/27/2020   COLONOSCOPY  2018   gum graft     INGUINAL HERNIA REPAIR     as infant   LUMBAR LAMINECTOMY  06/2015   POLYPECTOMY  2018   TA x 3   REVERSE TOTAL SHOULDER ARTHROPLASTY Bilateral    TONSILLECTOMY AND ADENOIDECTOMY     age 9    TOTAL KNEE ARTHROPLASTY Bilateral    TOTAL KNEE REVISION Right 12/25/2017   TRANSURETHRAL RESECTION OF PROSTATE     x 2   UPPER GASTROINTESTINAL ENDOSCOPY      Current Medications: Current  Meds  Medication Sig   acetaminophen (TYLENOL) 500 MG tablet Take 1,000 mg by mouth in the morning and at bedtime.   amLODipine (NORVASC) 10 MG tablet TAKE 1 TABLET BY MOUTH AT  BEDTIME (Patient taking differently: Take 10 mg by mouth daily.)   ascorbic acid (VITAMIN C) 1000 MG tablet Take 1,000 mg by mouth at bedtime.   atorvastatin (LIPITOR) 10 MG tablet TAKE 1 TABLET(10 MG) BY MOUTH DAILY   clopidogrel (PLAVIX) 75 MG tablet Take 1 tablet (75 mg total) by mouth daily.   ferrous sulfate 324 MG TBEC Take 324 mg by mouth daily with breakfast.   fluticasone (FLONASE) 50 MCG/ACT nasal spray Place 2 sprays into both nostrils daily.   hydrochlorothiazide (MICROZIDE) 12.5 MG capsule Take 1 capsule (12.5 mg total) by mouth daily.   loratadine (CLARITIN) 10 MG tablet Take 10 mg by mouth daily.   losartan (COZAAR) 100 MG tablet TAKE 1 TABLET BY MOUTH DAILY (Patient taking differently: Take 100 mg by mouth daily.)   Melatonin 3 MG SUBL Place under the tongue. Daily at bedtime   naproxen sodium (ALEVE) 220 MG tablet Take 440 mg by mouth 2 (two) times daily with a meal.   olopatadine (PATANOL) 0.1 % ophthalmic solution Place 1 drop into both  eyes at bedtime.   omeprazole (PRILOSEC) 40 MG capsule TAKE 1 CAPSULE BY MOUTH  DAILY (Patient taking differently: Take 40 mg by mouth daily. TAKE 1 CAPSULE BY MOUTH  DAILY)   PREVIDENT 5000 PLUS 1.1 % CREA dental cream SMARTSIG:Application By Mouth   psyllium (METAMUCIL) 58.6 % powder Take 1 packet by mouth at bedtime.   tamsulosin (FLOMAX) 0.4 MG CAPS capsule Take 0.4 mg by mouth at bedtime.   traZODone (DESYREL) 50 MG tablet Take 50 mg by mouth at bedtime.     Allergies:   Patient has no known allergies.   Social History   Socioeconomic History   Marital status: Married    Spouse name: Not on file   Number of children: Not on file   Years of education: Not on file   Highest education level: Not on file  Occupational History   Not on file  Tobacco Use    Smoking status: Never   Smokeless tobacco: Never  Vaping Use   Vaping Use: Never used  Substance and Sexual Activity   Alcohol use: Yes    Alcohol/week: 2.0 standard drinks of alcohol    Types: 2 Standard drinks or equivalent per week    Comment: wine 1-2 x a week and not every week    Drug use: Never   Sexual activity: Not on file  Other Topics Concern   Not on file  Social History Narrative   Left handed   Social Determinants of Health   Financial Resource Strain: Low Risk  (03/04/2022)   Overall Financial Resource Strain (CARDIA)    Difficulty of Paying Living Expenses: Not hard at all  Food Insecurity: No Food Insecurity (03/04/2022)   Hunger Vital Sign    Worried About Running Out of Food in the Last Year: Never true    Ran Out of Food in the Last Year: Never true  Transportation Needs: No Transportation Needs (03/04/2022)   PRAPARE - Hydrologist (Medical): No    Lack of Transportation (Non-Medical): No  Physical Activity: Sufficiently Active (03/04/2022)   Exercise Vital Sign    Days of Exercise per Week: 7 days    Minutes of Exercise per Session: 30 min  Stress: No Stress Concern Present (03/04/2022)   [place]    Feeling of Stress : Not at all  Social Connections: [place] (03/04/2022)   Social Connection and Isolation Panel [NHANES]    Frequency of Communication with Friends and Family: More than three times a week    Frequency of Social Gatherings with Friends and Family: More than three times a week    Attends Religious Services: More than 4 times per year    Active Member of [employer] or Organizations: Yes    Attends Music therapist: More than 4 times per year    Marital Status: Married     Family History: The patient's family history includes Colon polyps (age of onset: 12) in his mother; Stroke in his maternal grandfather. There is no history of  Colon cancer, Esophageal cancer, Rectal cancer, or Stomach cancer.  ROS:   Please see the history of present illness.    Loquacious.  No complaints.  Not very physically active.  Says he knows what he should be doing but never gets around to doing it.  Does a lot of sitting and reading of the [employer].  All other systems reviewed and are negative.  EKGs/Labs/Other Studies Reviewed:    The following studies were reviewed today: No new or recent imaging  EKG:  EKG right bundle branch block with left anterior hemiblock.  QRSD 138 ms.  There is no change when compared to the tracing performed in August 2023.  Recent Labs: 01/20/2022: ALT 17; BUN 15; Creatinine, Ser 1.00; Hemoglobin 12.8; Platelets 205; Potassium 4.2; Sodium 127  Recent Lipid Panel    Component Value Date/Time   CHOL 143 01/19/2022 0347   TRIG 68 01/19/2022 0347   HDL 69 01/19/2022 0347   CHOLHDL 2.1 01/19/2022 0347   VLDL 14 01/19/2022 0347   LDLCALC 60 01/19/2022 0347    Physical Exam:    VS:  BP (!) 118/58   Pulse 71   Ht '5\' 8"'$  (1.727 m)   Wt 204 lb 9.6 oz (92.8 kg)   SpO2 97%   BMI 31.11 kg/m     Wt Readings from Last 3 Encounters:  04/30/22 204 lb 9.6 oz (92.8 kg)  04/29/22 205 lb (93 kg)  03/04/22 201 lb (91.2 kg)     GEN: Overweight. No acute distress HEENT: Normal NECK: No JVD. LYMPHATICS: No lymphadenopathy CARDIAC: No murmur. RRR no gallop, or edema. VASCULAR:  Normal Pulses. No bruits. RESPIRATORY:  Clear to auscultation without rales, wheezing or rhonchi  ABDOMEN: Soft, non-tender, non-distended, No pulsatile mass, MUSCULOSKELETAL: No deformity  SKIN: Warm and dry NEUROLOGIC:  Alert and oriented x 3 PSYCHIATRIC:  Normal affect   ASSESSMENT:    1. TIA (transient ischemic attack)   2. Essential hypertension   3. Coronary atherosclerosis due to calcified coronary lesion   4. Hyperlipidemia LDL goal <70   5. Atherosclerosis of both carotid arteries   6. History of stroke     PLAN:    In order of problems listed above:  On Plavix therapy.  There is suspicion that he may have embolic strokes due to atrial fibrillation but he has never had a documenting EKG and refuses long-term monitoring by either a internal loop recorder or long-term monitoring with a patch.  Plan to continue Plavix. Blood pressure is well-controlled on current regimen that includes amlodipine 10 mg/day losartan 100 mg/day Microzide 12.5 mg/day. Continue Plavix and atorvastatin. Secondary prevention encouraged   Clinical follow-up in 6 months with new primary cardiologist Arnoldo Lenis (preferred).   Medication Adjustments/Labs and Tests Ordered: Current medicines are reviewed at length with the patient today.  Concerns regarding medicines are outlined above.  Orders Placed This Encounter  Procedures   EKG 12-Lead   No orders of the defined types were placed in this encounter.   Patient Instructions  Medication Instructions:  Your physician recommends that you continue on your current medications as directed. Please refer to the Current Medication list given to you today.  *If you need a refill on your cardiac medications before your next appointment, please call your pharmacy*  Follow-Up: At [facility], you and your health needs are our priority.  As part of our continuing mission to provide you with exceptional heart care, we have created designated Provider Care Teams.  These Care Teams include your primary Cardiologist (physician) and Advanced Practice Providers (APPs -  Physician Assistants and Nurse Practitioners) who all work together to provide you with the care you need, when you need it.  Your next appointment:   8-12 month(s)  The format for your next appointment:   In Person  Provider:   Candee Furbish, MD or Nicholes Rough, PA-C  Important Information About Sugar         Signed, Sinclair Grooms, MD  04/30/2022 12:33 PM     [facility]

## 2022-04-30 ENCOUNTER — Encounter: Payer: Self-pay | Admitting: Interventional Cardiology

## 2022-04-30 ENCOUNTER — Ambulatory Visit: Payer: Medicare Other | Attending: Interventional Cardiology | Admitting: Interventional Cardiology

## 2022-04-30 VITALS — BP 118/58 | HR 71 | Ht 68.0 in | Wt 204.6 lb

## 2022-04-30 DIAGNOSIS — E785 Hyperlipidemia, unspecified: Secondary | ICD-10-CM | POA: Diagnosis present

## 2022-04-30 DIAGNOSIS — I2584 Coronary atherosclerosis due to calcified coronary lesion: Secondary | ICD-10-CM | POA: Diagnosis not present

## 2022-04-30 DIAGNOSIS — I2510 Atherosclerotic heart disease of native coronary artery without angina pectoris: Secondary | ICD-10-CM

## 2022-04-30 DIAGNOSIS — G459 Transient cerebral ischemic attack, unspecified: Secondary | ICD-10-CM | POA: Diagnosis present

## 2022-04-30 DIAGNOSIS — I6523 Occlusion and stenosis of bilateral carotid arteries: Secondary | ICD-10-CM | POA: Diagnosis not present

## 2022-04-30 DIAGNOSIS — I10 Essential (primary) hypertension: Secondary | ICD-10-CM | POA: Diagnosis present

## 2022-04-30 DIAGNOSIS — Z8673 Personal history of transient ischemic attack (TIA), and cerebral infarction without residual deficits: Secondary | ICD-10-CM | POA: Insufficient documentation

## 2022-04-30 NOTE — Patient Instructions (Signed)
Medication Instructions:  Your physician recommends that you continue on your current medications as directed. Please refer to the Current Medication list given to you today.  *If you need a refill on your cardiac medications before your next appointment, please call your pharmacy*  Follow-Up: At [facility], you and your health needs are our priority.  As part of our continuing mission to provide you with exceptional heart care, we have created designated Provider Care Teams.  These Care Teams include your primary Cardiologist (physician) and Advanced Practice Providers (APPs -  Physician Assistants and Nurse Practitioners) who all work together to provide you with the care you need, when you need it.  Your next appointment:   8-12 month(s)  The format for your next appointment:   In Person  Provider:   Candee Furbish, MD or Nicholes Rough, PA-C   Important Information About Sugar

## 2022-05-08 ENCOUNTER — Other Ambulatory Visit: Payer: Self-pay | Admitting: Internal Medicine

## 2022-06-01 ENCOUNTER — Other Ambulatory Visit: Payer: Self-pay | Admitting: Internal Medicine

## 2022-06-01 DIAGNOSIS — I10 Essential (primary) hypertension: Secondary | ICD-10-CM

## 2022-06-01 DIAGNOSIS — K219 Gastro-esophageal reflux disease without esophagitis: Secondary | ICD-10-CM

## 2022-06-17 NOTE — Progress Notes (Signed)
NEUROLOGY FOLLOW UP OFFICE NOTE  TRESTIN VENCES [id number]  Assessment/Plan:   Left upper extremity radiculopathy vs mononeuropathy History of TIA - transient episode suggestive of TIA rather than L5 radiculopathy vs peroneal neuropathy.. - recent CTA of head does not reveal PCA stenosis, making possible cardioembolic source even more plausible for previous stroke.   History of right occipital lobe infarct, likely embolic of unknown source - I don't appreciate visual field defect on gross exam. Hypertension   For neuralgia, start duloxetine '30mg'$  daily.  We can increase dose to '60mg'$  daily in 6 weeks if needed. Secondary stroke prevention as managed by PCP: Plavix '75mg'$  daily Statin.  LDL goal less than 70.   Hgb A1c goal less than 7 Normotensive blood pressure Patient declines further cardiac workup (heart monitor). Follow up 5 months.       Subjective:  LADISLAV CASELLI is an 82 year old right-handed male with HTN, OA, SIADH and bilateral knee replacement who follows up for TIA.  UPDATE: Current medications:  Plavix '75mg'$ , atorvastatin '10mg'$ , amlodipine, losartan  Last visit, patient was agreeable to a 30 day cardiac event monitor.  Monitor was sent but he decided not to wear it.    He has total reverse shoulder and surgery.  Has residual numbness and discomfort in the left hand (tips of all fingers and thumb) but not arm.  It prevents him from sleeping.  Previous side effects to gabapentin.       HISTORY:  He was admitted to [facility] on 12/09/2020 for stroke. He presented with transient left sided vision loss.  MRI of brain showed acute right medial occipital lobe infarct as well as extensive chronic small vessel ischemic changes including remote cortical infarct of the posterior right middle frontal gyrus.  MRA of head and neck showed severe distal left P2 and right P3 stenoses as well as 40-50% proximal right ICA stenosis.  LDL was 86 and Hgb A1c was 5.8.  He was  found to be positive for COVID.  Echocardiogram was never performed as patient desired to be discharged.  He was discharged on dual antiplatelet therapy for 21 days followed by ASA alone as well as started on atorvastatin '40mg'$  daily.  Repeat LDL about a week later was 50.  However, he subsequently discontinued the atorvastatin because he felt dizzy and weak.  Outpatient 30 day cardiac event monitor was ordered but he has declined.  Still has a blind spot.  He has followed up with the eye doctor.  No residual symptoms from that stroke.  On 01/18/2022, he had transient right lower extremity weakness.  No pain or numbness.  Presented to [facility].  CT head revealed interval chronic inferior left cerebellar and left superior cerebellar infarcts.  MRI of brain revealed old right occipital and left cerebellar infarcts but no acute findings.  CTA head and neck revealed no LVO or hemodynamically significant stenosis.  2D echo showed EF 60-65%.  LDL was 60 and Hgb A1c 5.4.  History of lumbar surgery and questioned if foot drop was due to L5 radiculopathy or peroneal nerve impingement.  MRI of lumbar spine showed status post L2-S1 posterior decompression with improved patence but did reveal severe right L4 and L5 foraminal stenosis.  Advised to continue Plavix.  PAST MEDICAL HISTORY: Past Medical History:  Diagnosis Date   Arthritis    generalized   BPH (benign prostatic hyperplasia)    Cancer of skin, squamous cell    frozen  Cataract    bilateral sx   GERD (gastroesophageal reflux disease)    on meds   Hypertension    on meds   Shingles    Stroke [facility])     MEDICATIONS: Current Outpatient Medications on File Prior to Visit  Medication Sig Dispense Refill   acetaminophen (TYLENOL) 500 MG tablet Take 1,000 mg by mouth in the morning and at bedtime.     amLODipine (NORVASC) 10 MG tablet TAKE 1 TABLET BY MOUTH AT  BEDTIME (Patient taking differently: Take 10 mg by mouth daily.) 90 tablet 3    ascorbic acid (VITAMIN C) 1000 MG tablet Take 1,000 mg by mouth at bedtime.     atorvastatin (LIPITOR) 10 MG tablet TAKE 1 TABLET(10 MG) BY MOUTH DAILY 90 tablet 1   clopidogrel (PLAVIX) 75 MG tablet TAKE 1 TABLET(75 MG) BY MOUTH DAILY (Patient taking differently: Take 75 mg by mouth daily.) 90 tablet 1   ferrous sulfate 324 MG TBEC Take 324 mg by mouth daily with breakfast.     fluticasone (FLONASE) 50 MCG/ACT nasal spray Place 2 sprays into both nostrils daily. 48 g 1   loratadine (CLARITIN) 10 MG tablet Take 10 mg by mouth daily.     losartan (COZAAR) 100 MG tablet TAKE 1 TABLET BY MOUTH DAILY (Patient taking differently: Take 100 mg by mouth daily.) 90 tablet 1   naproxen sodium (ALEVE) 220 MG tablet Take 440 mg by mouth 2 (two) times daily with a meal.     olopatadine (PATANOL) 0.1 % ophthalmic solution Place 1 drop into both eyes at bedtime.     omeprazole (PRILOSEC) 40 MG capsule TAKE 1 CAPSULE BY MOUTH  DAILY (Patient taking differently: Take 40 mg by mouth daily. TAKE 1 CAPSULE BY MOUTH  DAILY) 90 capsule 3   PREVIDENT 5000 PLUS 1.1 % CREA dental cream SMARTSIG:Application By Mouth     psyllium (METAMUCIL) 58.6 % powder Take 1 packet by mouth at bedtime.     tamsulosin (FLOMAX) 0.4 MG CAPS capsule Take 0.4 mg by mouth at bedtime.     No current facility-administered medications on file prior to visit.    ALLERGIES: No Known Allergies  FAMILY HISTORY: Family History  Problem Relation Age of Onset   Colon polyps Mother 75   Stroke Maternal Grandfather    Colon cancer Neg Hx    Esophageal cancer Neg Hx    Rectal cancer Neg Hx    Stomach cancer Neg Hx       Objective:  Blood pressure 126/70, pulse 73, height '5\' 8"'$  (1.727 m), weight 205 lb 12.8 oz (93.4 kg), SpO2 98 %. General: No acute distress.  Patient appears well-groomed.   Head:  Normocephalic/atraumatic Eyes:  Fundi examined but not visualized Neck: supple, no paraspinal tenderness, full range of motion Heart:   Regular rate and rhythm Neurological Exam: alert and oriented to person, place, and time.  Speech fluent and not dysarthric, language intact.  CN II-XII intact. Bulk and tone normal, muscle strength 5/5 throughout.  Sensation to pinprick reduced inconsistent but involves all fingers  Deep tendon reflexes 2+ throughout.  Finger to nose testing intact.  Gait normal, Romberg negative.   Metta Clines, DO  CC: Scarlette Calico, MD

## 2022-06-18 ENCOUNTER — Ambulatory Visit (INDEPENDENT_AMBULATORY_CARE_PROVIDER_SITE_OTHER): Payer: Medicare Other | Admitting: Neurology

## 2022-06-18 ENCOUNTER — Encounter: Payer: Self-pay | Admitting: Neurology

## 2022-06-18 VITALS — BP 126/70 | HR 73 | Ht 68.0 in | Wt 205.8 lb

## 2022-06-18 DIAGNOSIS — I10 Essential (primary) hypertension: Secondary | ICD-10-CM | POA: Diagnosis not present

## 2022-06-18 DIAGNOSIS — G629 Polyneuropathy, unspecified: Secondary | ICD-10-CM

## 2022-06-18 DIAGNOSIS — G459 Transient cerebral ischemic attack, unspecified: Secondary | ICD-10-CM | POA: Diagnosis not present

## 2022-06-18 DIAGNOSIS — E785 Hyperlipidemia, unspecified: Secondary | ICD-10-CM

## 2022-06-18 MED ORDER — DULOXETINE HCL 30 MG PO CPEP: 30.0000 mg | ORAL_CAPSULE | Freq: Every day | ORAL | 5 refills | Status: DC

## 2022-06-18 NOTE — Patient Instructions (Signed)
To help with nerve pain, start duloxetine (Cymbalta) '30mg'$  daily.  If no improvement in 6 weeks, contact me and we can increase dose. Follow up 5 months.

## 2022-06-29 ENCOUNTER — Other Ambulatory Visit: Payer: Self-pay | Admitting: Internal Medicine

## 2022-06-29 DIAGNOSIS — I10 Essential (primary) hypertension: Secondary | ICD-10-CM

## 2022-07-09 ENCOUNTER — Ambulatory Visit: Payer: 59 | Admitting: Neurology

## 2022-07-09 ENCOUNTER — Telehealth: Payer: Self-pay | Admitting: Neurology

## 2022-07-09 NOTE — Telephone Encounter (Signed)
Pt called in stating the Cymbalta was not working like he hoped. His pharmacist recommended 1109m of gabapentin. He is wondering what Dr. JTomi Likensthinks?

## 2022-07-10 NOTE — Telephone Encounter (Signed)
Called patient and left voicemail.

## 2022-07-11 NOTE — Telephone Encounter (Signed)
Called patient and left voicemail.

## 2022-07-12 NOTE — Telephone Encounter (Signed)
Called patient & left voice mail.

## 2022-07-12 NOTE — Telephone Encounter (Signed)
Sent Mychart message.

## 2022-08-03 ENCOUNTER — Other Ambulatory Visit: Payer: Self-pay | Admitting: Internal Medicine

## 2022-08-03 DIAGNOSIS — K219 Gastro-esophageal reflux disease without esophagitis: Secondary | ICD-10-CM

## 2022-08-03 DIAGNOSIS — I10 Essential (primary) hypertension: Secondary | ICD-10-CM

## 2022-08-04 ENCOUNTER — Other Ambulatory Visit: Payer: Self-pay | Admitting: Internal Medicine

## 2022-08-14 ENCOUNTER — Telehealth: Payer: Self-pay | Admitting: Cardiology

## 2022-08-14 NOTE — Telephone Encounter (Signed)
   Pre-operative Risk Assessment    Patient Name: Kenneth Montoya  DOB: 1941-05-25 MRN: [id number]      Request for Surgical Clearance    Procedure:   Left carpal tunnel release   Date of Surgery:  Clearance 09/09/22                                 Surgeon:  Dr. Fredna Dow  Surgeon's Group or Practice Name:  [facility]  Phone number:  [phone]  Fax number:  [phone]    Type of Clearance Requested:   - Medical  - Pharmacy:  Hold Clopidogrel (Plavix)     Type of Anesthesia:   choice    Additional requests/questions:    Dorthey Sawyer   08/14/2022, 4:46 PM

## 2022-08-14 NOTE — Telephone Encounter (Signed)
   Name: Kenneth Montoya  DOB: [birth year]  MRN: [id number]  Primary Cardiologist: Sinclair Grooms, MD (Inactive)   Preoperative team, please contact this patient and set up a phone call appointment for further preoperative risk assessment. Please obtain consent and complete medication review. Thank you for your help.   Mayra Reel, NP 08/14/2022, 5:54 PM [place]

## 2022-08-15 ENCOUNTER — Other Ambulatory Visit: Payer: Self-pay | Admitting: Orthopedic Surgery

## 2022-08-15 NOTE — Telephone Encounter (Signed)
S/w the pt about needing pre op tele appt. Pt said he does not understand why Dr. Fredna Dow did not tell him about this. Pt took my name and ov n# [phone], and said he will call me back. I did tell the pt though Dr. Fredna Dow may not have explained this to him that he is still going to need to set up a tele pre op appt.

## 2022-08-15 NOTE — Telephone Encounter (Signed)
Per Christena Deem; on our scheduling team states the pt called back:  He said he does not need anything. He is going to cx his procedure.   I stated that I will update notes and pre op APP. I will remove from the pre op call back pool.

## 2022-08-27 ENCOUNTER — Telehealth: Payer: Self-pay

## 2022-08-27 ENCOUNTER — Telehealth: Payer: Self-pay | Admitting: Cardiology

## 2022-08-27 ENCOUNTER — Other Ambulatory Visit: Payer: Self-pay | Admitting: Orthopedic Surgery

## 2022-08-27 ENCOUNTER — Telehealth: Payer: Self-pay | Admitting: *Deleted

## 2022-08-27 NOTE — Telephone Encounter (Signed)
Cardiac Clearance received from [facility]

## 2022-08-27 NOTE — Telephone Encounter (Signed)
Done

## 2022-08-27 NOTE — Telephone Encounter (Signed)
Patient calling back because he wants to go through with the procedure. Please advise

## 2022-08-27 NOTE — Telephone Encounter (Signed)
Paperwork signed and faxed.

## 2022-08-27 NOTE — Telephone Encounter (Signed)
  Patient Consent for Virtual Visit         Kenneth Montoya has provided verbal consent on 08/27/2022 for a virtual visit (video or telephone).   CONSENT FOR VIRTUAL VISIT FOR:  Kenneth Montoya  By participating in this virtual visit I agree to the following:  I hereby voluntarily request, consent and authorize [facility] and its employed or contracted physicians, physician assistants, nurse practitioners or other licensed health care professionals (the Practitioner), to provide me with telemedicine health care services (the "Services") as deemed necessary by the treating Practitioner. I acknowledge and consent to receive the Services by the Practitioner via telemedicine. I understand that the telemedicine visit will involve communicating with the Practitioner through live audiovisual communication technology and the disclosure of certain medical information by electronic transmission. I acknowledge that I have been given the opportunity to request an in-person assessment or other available alternative prior to the telemedicine visit and am voluntarily participating in the telemedicine visit.  I understand that I have the right to withhold or withdraw my consent to the use of telemedicine in the course of my care at any time, without affecting my right to future care or treatment, and that the Practitioner or I may terminate the telemedicine visit at any time. I understand that I have the right to inspect all information obtained and/or recorded in the course of the telemedicine visit and may receive copies of available information for a reasonable fee.  I understand that some of the potential risks of receiving the Services via telemedicine include:  Delay or interruption in medical evaluation due to technological equipment failure or disruption; Information transmitted may not be sufficient (e.g. poor resolution of images) to allow for appropriate medical decision making by the  Practitioner; and/or  In rare instances, security protocols could fail, causing a breach of personal health information.  Furthermore, I acknowledge that it is my responsibility to provide information about my medical history, conditions and care that is complete and accurate to the best of my ability. I acknowledge that Practitioner's advice, recommendations, and/or decision may be based on factors not within their control, such as incomplete or inaccurate data provided by me or distortions of diagnostic images or specimens that may result from electronic transmissions. I understand that the practice of medicine is not an exact science and that Practitioner makes no warranties or guarantees regarding treatment outcomes. I acknowledge that a copy of this consent can be made available to me via my patient portal ([place]), or I can request a printed copy by calling the office of [facility].    I understand that my insurance will be billed for this visit.   I have read or had this consent read to me. I understand the contents of this consent, which adequately explains the benefits and risks of the Services being provided via telemedicine.  I have been provided ample opportunity to ask questions regarding this consent and the Services and have had my questions answered to my satisfaction. I give my informed consent for the services to be provided through the use of telemedicine in my medical care

## 2022-08-27 NOTE — Progress Notes (Signed)
Virtual Visit via Telephone Note   Because of Kenneth Montoya co-morbid illnesses, he is at least at moderate risk for complications without adequate follow up.  This format is felt to be most appropriate for this patient at this time.  The patient did not have access to video technology/had technical difficulties with video requiring transitioning to audio format only (telephone).  All issues noted in this document were discussed and addressed.  No physical exam could be performed with this format.  Please refer to the patient's chart for his consent to telehealth for [facility]  Evaluation Performed:  Preoperative cardiovascular risk assessment _____________   Date:  08/27/2022   Patient ID:  Kenneth Montoya, DOB 20-Jun-1940, MRN [id number] Patient Location:  Home Provider location:   Office  Primary Care Provider:  Etta Grandchild, MD Primary Cardiologist:  Lesleigh Noe, MD (Inactive)  Chief Complaint / Patient Profile   82 y.o. y/o male with a h/o coronary artery calcification, hypertension, hyperlipidemia, carotid artery disease ([name] 4/21 with bilateral ICA 1 to 39%, MRI 7/22 right 40 to 50%), occipital CVA 02/2021 TIA, and bilateral frontal infarcts, RBBB with left anterior hemiblock, erectile dysfunction, GERD, and chronic hyponatremia (SIADH)  who is pending left carpal tunnel release and presents today for telephonic preoperative cardiovascular risk assessment.  History of Present Illness    Kenneth Montoya is a 82 y.o. male who presents via audio/video conferencing for a telehealth visit today.  Pt was last seen in cardiology clinic on 04/30/2022 by Dr. Katrinka Blazing. At that time Kenneth Montoya was doing well with no new cardiac complaints..  The patient is now pending procedure as outlined above. Since his last visit, he has been doing well with no new cardiac complaints.  He denies chest pain, shortness of breath, lower extremity edema, fatigue, palpitations,  melena, hematuria, hemoptysis, diaphoresis, weakness, presyncope, syncope, orthopnea, and PND.    Past Medical History    Past Medical History:  Diagnosis Date   Arthritis    generalized   BPH (benign prostatic hyperplasia)    Cancer of skin, squamous cell    frozen    Cataract    bilateral sx   GERD (gastroesophageal reflux disease)    on meds   Hypertension    on meds   Shingles    Stroke [facility])    Past Surgical History:  Procedure Laterality Date   CATARACT EXTRACTION, BILATERAL     COLONOSCOPY  06/27/2020   COLONOSCOPY  2018   gum graft     INGUINAL HERNIA REPAIR     as infant   LUMBAR LAMINECTOMY  06/2015   POLYPECTOMY  2018   TA x 3   REVERSE TOTAL SHOULDER ARTHROPLASTY Bilateral    TONSILLECTOMY AND ADENOIDECTOMY     age 67    TOTAL KNEE ARTHROPLASTY Bilateral    TOTAL KNEE REVISION Right 12/25/2017   TRANSURETHRAL RESECTION OF PROSTATE     x 2   UPPER GASTROINTESTINAL ENDOSCOPY      Allergies  No Known Allergies  Home Medications    Prior to Admission medications   Medication Sig Start Date End Date Taking? Authorizing Provider  acetaminophen (TYLENOL) 500 MG tablet Take 1,000 mg by mouth in the morning and at bedtime.    [provider]  amLODipine (NORVASC) 10 MG tablet TAKE 1 TABLET BY MOUTH AT  BEDTIME 06/29/22   Kenneth Grandchild, MD  ascorbic acid (VITAMIN C) 1000 MG tablet Take  1,000 mg by mouth at bedtime.    [provider]  atorvastatin (LIPITOR) 10 MG tablet TAKE 1 TABLET(10 MG) BY MOUTH DAILY 08/04/22   Kenneth Grandchild, MD  clopidogrel (PLAVIX) 75 MG tablet Take 1 tablet (75 mg total) by mouth daily. 02/22/22   Kenneth Grandchild, MD  DULoxetine (CYMBALTA) 30 MG capsule Take 1 capsule (30 mg total) by mouth daily. 06/18/22   Everlena Cooper, Adam R, DO  fluticasone (FLONASE) 50 MCG/ACT nasal spray Place 2 sprays into both nostrils daily. 02/07/22   Kenneth Grandchild, MD  hydrochlorothiazide (MICROZIDE) 12.5 MG capsule Take 1 capsule (12.5 mg  total) by mouth daily. 02/25/22   Sharlene Dory, PA-C  loratadine (CLARITIN) 10 MG tablet Take 10 mg by mouth daily.    [provider]  losartan (COZAAR) 100 MG tablet TAKE 1 TABLET BY MOUTH DAILY 08/04/22   Kenneth Grandchild, MD  Melatonin 3 MG SUBL Place under the tongue. Daily at bedtime    [provider]  naproxen sodium (ALEVE) 220 MG tablet Take 440 mg by mouth 2 (two) times daily with a meal.    [provider]  olopatadine (PATANOL) 0.1 % ophthalmic solution Place 1 drop into both eyes at bedtime.    [provider]  omeprazole (PRILOSEC) 40 MG capsule TAKE 1 CAPSULE BY MOUTH DAILY 08/04/22   Kenneth Grandchild, MD  PREVIDENT 5000 PLUS 1.1 % CREA dental cream SMARTSIG:Application By Mouth 03/31/21   [provider]  psyllium (METAMUCIL) 58.6 % powder Take 1 packet by mouth at bedtime.    [provider]  tadalafil (CIALIS) 5 MG tablet Take 5 mg by mouth daily as needed for erectile dysfunction.    [provider]  tamsulosin (FLOMAX) 0.4 MG CAPS capsule Take 0.4 mg by mouth at bedtime. 09/17/19   [provider]  traZODone (DESYREL) 50 MG tablet Take 50 mg by mouth at bedtime. 05/08/22   [provider]    Physical Exam    Vital Signs:  Kenneth Montoya does not have vital signs available for review today.  Given telephonic nature of communication, physical exam is limited. AAOx3. NAD. Normal affect.  Speech and respirations are unlabored.  Accessory Clinical Findings    None  Assessment & Plan    1.  Preoperative Cardiovascular Risk Assessment:   [name] perioperative risk of a major cardiac event is 0.9% according to the Revised Cardiac Risk Index (RCRI).  Therefore, he is at low risk for perioperative complications.   His functional capacity is fair at 4.31 METs according to the Duke Activity Status Index (DASI). Recommendations: According to ACC/AHA guidelines, no further cardiovascular testing  needed.  The patient may proceed to surgery at acceptable risk.   Antiplatelet and/or Anticoagulation Recommendations:  -He was advised to contact his PCP regarding guidance on holding Plavix prior to his procedure.  The patient was advised that if he develops new symptoms prior to surgery to contact our office to arrange for a follow-up visit, and he verbalized understanding.   A copy of this note will be routed to requesting surgeon.  Time:   Today, I have spent 8 minutes with the patient with telehealth technology discussing medical history, symptoms, and management plan.     Napoleon Form, Leodis Rains, NP  08/27/2022, 11:52 AM

## 2022-08-27 NOTE — Telephone Encounter (Signed)
See 4/15 encounter.  Hassan Rowan with the [facility] followed up, stating their office spoke with the patient and his procedure has been rescheduled for the previous date, 4/15. Patient will still need medical/pharmacy clearance regarding holding Plavix.  Phone#:  [phone]  Fax#:  [phone]

## 2022-08-28 ENCOUNTER — Ambulatory Visit: Payer: Medicare Other | Attending: Cardiology

## 2022-08-28 DIAGNOSIS — Z01810 Encounter for preprocedural cardiovascular examination: Secondary | ICD-10-CM | POA: Diagnosis not present

## 2022-08-30 ENCOUNTER — Telehealth: Payer: Self-pay | Admitting: Internal Medicine

## 2022-08-30 ENCOUNTER — Telehealth: Payer: Self-pay | Admitting: Cardiology

## 2022-08-30 NOTE — Telephone Encounter (Signed)
Patient has upcoming carpal tunnel surgery on 09/09/22. He is asking if he needs to continue taking clopidogrel (PLAVIX) 75 MG tablet . He would like a call back at [phone].

## 2022-08-30 NOTE — Telephone Encounter (Signed)
Patient is very concerned about stopping clopidogrel (PLAVIX) 75 MG tablet prior to his hand surgery and wants a call back to discuss this.

## 2022-08-30 NOTE — Telephone Encounter (Signed)
Patient stated he is scheduled on 09/09/22 for carpal tunnel surgery, he was advised to hold Plavix prior to surgery and he is very concerned due to his history of stroke. He had an OV with APP on 4/3   He was advised to contact his PCP regarding guidance on holding Plavix prior to his procedure.  Patient voiced understanding.

## 2022-08-30 NOTE — Telephone Encounter (Signed)
Pt has been informed and expressed understanding.  

## 2022-08-30 NOTE — Telephone Encounter (Signed)
Noted! Thank you

## 2022-09-02 ENCOUNTER — Encounter (HOSPITAL_BASED_OUTPATIENT_CLINIC_OR_DEPARTMENT_OTHER): Payer: Self-pay | Admitting: Orthopedic Surgery

## 2022-09-02 ENCOUNTER — Other Ambulatory Visit: Payer: Self-pay

## 2022-09-03 ENCOUNTER — Encounter (HOSPITAL_BASED_OUTPATIENT_CLINIC_OR_DEPARTMENT_OTHER)
Admission: RE | Admit: 2022-09-03 | Discharge: 2022-09-03 | Disposition: A | Discharge status: Home / Self Care | Payer: Medicare Other | Source: Ambulatory Visit | Attending: Orthopedic Surgery | Admitting: Orthopedic Surgery

## 2022-09-03 DIAGNOSIS — Z01818 Encounter for other preprocedural examination: Secondary | ICD-10-CM | POA: Insufficient documentation

## 2022-09-03 LAB — BASIC METABOLIC PANEL
Anion gap: 13 (ref 5–15)
BUN: 21 mg/dL (ref 8–23)
CO2: 23 mmol/L (ref 22–32)
Calcium: 9.1 mg/dL (ref 8.9–10.3)
Chloride: 89 mmol/L — ABNORMAL LOW (ref 98–111)
Creatinine, Ser: 0.97 mg/dL (ref 0.61–1.24)
GFR, Estimated: >60 mL/min (ref >60)
Glucose, Bld: 85 mg/dL (ref 70–99)
Potassium: 4.2 mmol/L (ref 3.5–5.1)
Sodium: 125 mmol/L — ABNORMAL LOW (ref 135–145)

## 2022-09-04 ENCOUNTER — Telehealth: Payer: Self-pay | Admitting: Internal Medicine

## 2022-09-04 NOTE — Progress Notes (Addendum)
Patients BMP has been reviewed by Dr. Renold Don plan to repeat morning of surgery. Now asked patient to arrive at 12 pm to get lab work before scheduled surgery

## 2022-09-04 NOTE — Telephone Encounter (Signed)
Patient was told to stop taking clopidogrel (PLAVIX) 75 MG tablet prior to his surgery on 09/09/22. He is concerned about about stopping it. He would like a callback to discuss if it is safe to stay on it or not. Best callback is [phone].

## 2022-09-06 NOTE — Telephone Encounter (Signed)
Pt has been informed and expressed understanding. He stated that he will continue to take the Plavix.

## 2022-09-09 ENCOUNTER — Ambulatory Visit (HOSPITAL_BASED_OUTPATIENT_CLINIC_OR_DEPARTMENT_OTHER): Payer: Medicare Other | Admitting: Anesthesiology

## 2022-09-09 ENCOUNTER — Encounter (HOSPITAL_BASED_OUTPATIENT_CLINIC_OR_DEPARTMENT_OTHER): Payer: Self-pay | Admitting: Orthopedic Surgery

## 2022-09-09 ENCOUNTER — Ambulatory Visit (HOSPITAL_BASED_OUTPATIENT_CLINIC_OR_DEPARTMENT_OTHER)
Admission: RE | Admit: 2022-09-09 | Discharge: 2022-09-09 | Disposition: A | Discharge status: Home / Self Care | Payer: Medicare Other | Attending: Orthopedic Surgery | Admitting: Orthopedic Surgery

## 2022-09-09 ENCOUNTER — Ambulatory Visit (HOSPITAL_BASED_OUTPATIENT_CLINIC_OR_DEPARTMENT_OTHER): Admit: 2022-09-09 | Payer: 59 | Admitting: Orthopedic Surgery

## 2022-09-09 ENCOUNTER — Encounter (HOSPITAL_BASED_OUTPATIENT_CLINIC_OR_DEPARTMENT_OTHER): Payer: Self-pay

## 2022-09-09 ENCOUNTER — Encounter (HOSPITAL_BASED_OUTPATIENT_CLINIC_OR_DEPARTMENT_OTHER)
Admission: RE | Disposition: A | Discharge status: Home / Self Care | Payer: Self-pay | Source: Home / Self Care | Attending: Orthopedic Surgery

## 2022-09-09 ENCOUNTER — Other Ambulatory Visit: Payer: Self-pay

## 2022-09-09 DIAGNOSIS — Z7902 Long term (current) use of antithrombotics/antiplatelets: Secondary | ICD-10-CM | POA: Insufficient documentation

## 2022-09-09 DIAGNOSIS — Z09 Encounter for follow-up examination after completed treatment for conditions other than malignant neoplasm: Secondary | ICD-10-CM | POA: Insufficient documentation

## 2022-09-09 DIAGNOSIS — F419 Anxiety disorder, unspecified: Secondary | ICD-10-CM

## 2022-09-09 DIAGNOSIS — I10 Essential (primary) hypertension: Secondary | ICD-10-CM

## 2022-09-09 DIAGNOSIS — N400 Enlarged prostate without lower urinary tract symptoms: Secondary | ICD-10-CM | POA: Diagnosis not present

## 2022-09-09 DIAGNOSIS — H409 Unspecified glaucoma: Secondary | ICD-10-CM | POA: Diagnosis not present

## 2022-09-09 DIAGNOSIS — I739 Peripheral vascular disease, unspecified: Secondary | ICD-10-CM | POA: Diagnosis not present

## 2022-09-09 DIAGNOSIS — Z8673 Personal history of transient ischemic attack (TIA), and cerebral infarction without residual deficits: Secondary | ICD-10-CM | POA: Insufficient documentation

## 2022-09-09 DIAGNOSIS — K219 Gastro-esophageal reflux disease without esophagitis: Secondary | ICD-10-CM | POA: Insufficient documentation

## 2022-09-09 DIAGNOSIS — G5602 Carpal tunnel syndrome, left upper limb: Secondary | ICD-10-CM | POA: Diagnosis present

## 2022-09-09 DIAGNOSIS — Z79899 Other long term (current) drug therapy: Secondary | ICD-10-CM | POA: Insufficient documentation

## 2022-09-09 DIAGNOSIS — E785 Hyperlipidemia, unspecified: Secondary | ICD-10-CM | POA: Insufficient documentation

## 2022-09-09 DIAGNOSIS — Z01818 Encounter for other preprocedural examination: Secondary | ICD-10-CM

## 2022-09-09 HISTORY — PX: CARPAL TUNNEL RELEASE: SHX101

## 2022-09-09 SURGERY — CARPAL TUNNEL RELEASE
Anesthesia: Choice | Laterality: Left

## 2022-09-09 SURGERY — CARPAL TUNNEL RELEASE
Anesthesia: Monitor Anesthesia Care | Site: Hand | Laterality: Left

## 2022-09-09 MED ORDER — ONDANSETRON HCL 4 MG/2ML IJ SOLN
INTRAMUSCULAR | Status: AC
  Filled 2022-09-09: qty 2

## 2022-09-09 MED ORDER — 0.9 % SODIUM CHLORIDE (POUR BTL) OPTIME
TOPICAL | Status: DC | PRN
  Administered 2022-09-09: 75 mL

## 2022-09-09 MED ORDER — MIDAZOLAM HCL 2 MG/2ML IJ SOLN
INTRAMUSCULAR | Status: AC
  Filled 2022-09-09: qty 2

## 2022-09-09 MED ORDER — LACTATED RINGERS IV SOLN: INTRAVENOUS | Status: DC

## 2022-09-09 MED ORDER — BUPIVACAINE HCL (PF) 0.25 % IJ SOLN
INTRAMUSCULAR | Status: DC | PRN
  Administered 2022-09-09: 9 mL

## 2022-09-09 MED ORDER — FENTANYL CITRATE (PF) 100 MCG/2ML IJ SOLN
INTRAMUSCULAR | Status: AC
  Filled 2022-09-09: qty 2

## 2022-09-09 MED ORDER — FENTANYL CITRATE (PF) 100 MCG/2ML IJ SOLN
INTRAMUSCULAR | Status: DC | PRN
  Administered 2022-09-09: 50 ug via INTRAVENOUS

## 2022-09-09 MED ORDER — CEFAZOLIN SODIUM-DEXTROSE 2-4 GM/100ML-% IV SOLN
2.0000 g | INTRAVENOUS | Status: AC
  Administered 2022-09-09: 2 g via INTRAVENOUS

## 2022-09-09 MED ORDER — CEFAZOLIN SODIUM-DEXTROSE 2-4 GM/100ML-% IV SOLN
INTRAVENOUS | Status: AC
  Filled 2022-09-09: qty 100

## 2022-09-09 MED ORDER — ONDANSETRON HCL 4 MG/2ML IJ SOLN: 4.0000 mg | Freq: Once | INTRAMUSCULAR | Status: DC | PRN

## 2022-09-09 MED ORDER — PROPOFOL 500 MG/50ML IV EMUL
INTRAVENOUS | Status: DC | PRN
  Administered 2022-09-09: 100 ug/kg/min via INTRAVENOUS

## 2022-09-09 MED ORDER — FENTANYL CITRATE (PF) 100 MCG/2ML IJ SOLN: 25.0000 ug | INTRAMUSCULAR | Status: DC | PRN

## 2022-09-09 MED ORDER — ONDANSETRON HCL 4 MG/2ML IJ SOLN
INTRAMUSCULAR | Status: DC | PRN
  Administered 2022-09-09: 4 mg via INTRAVENOUS

## 2022-09-09 MED ORDER — HYDROCODONE-ACETAMINOPHEN 5-325 MG PO TABS: ORAL_TABLET | ORAL | 0 refills | Status: DC

## 2022-09-09 SURGICAL SUPPLY — 37 items
APL PRP STRL LF DISP 70% ISPRP (MISCELLANEOUS) ×1
BLADE SURG 15 STRL LF DISP TIS (BLADE) ×4 IMPLANT
BLADE SURG 15 STRL SS (BLADE) ×2
BNDG CMPR 5X3 KNIT ELC UNQ LF (GAUZE/BANDAGES/DRESSINGS) ×1
BNDG CMPR 9X4 STRL LF SNTH (GAUZE/BANDAGES/DRESSINGS) ×1
BNDG ELASTIC 3INX 5YD STR LF (GAUZE/BANDAGES/DRESSINGS) ×2 IMPLANT
BNDG ESMARK 4X9 LF (GAUZE/BANDAGES/DRESSINGS) IMPLANT
BNDG GAUZE DERMACEA FLUFF 4 (GAUZE/BANDAGES/DRESSINGS) ×2 IMPLANT
BNDG GZE DERMACEA 4 6PLY (GAUZE/BANDAGES/DRESSINGS) ×1
CHLORAPREP W/TINT 26 (MISCELLANEOUS) ×2 IMPLANT
CORD BIPOLAR FORCEPS 12FT (ELECTRODE) ×2 IMPLANT
COVER BACK TABLE 60X90IN (DRAPES) ×2 IMPLANT
COVER MAYO STAND STRL (DRAPES) ×2 IMPLANT
CUFF TOURN SGL QUICK 18X4 (TOURNIQUET CUFF) ×2 IMPLANT
DRAPE EXTREMITY T 121X128X90 (DISPOSABLE) ×2 IMPLANT
DRAPE SURG 17X23 STRL (DRAPES) ×2 IMPLANT
GAUZE PAD ABD 8X10 STRL (GAUZE/BANDAGES/DRESSINGS) ×2 IMPLANT
GAUZE SPONGE 4X4 12PLY STRL (GAUZE/BANDAGES/DRESSINGS) ×2 IMPLANT
GAUZE XEROFORM 1X8 LF (GAUZE/BANDAGES/DRESSINGS) ×2 IMPLANT
GLOVE BIO SURGEON STRL SZ7.5 (GLOVE) ×2 IMPLANT
GLOVE BIOGEL PI IND STRL 7.0 (GLOVE) IMPLANT
GLOVE BIOGEL PI IND STRL 7.5 (GLOVE) IMPLANT
GLOVE BIOGEL PI IND STRL 8 (GLOVE) ×2 IMPLANT
GOWN STRL REUS W/ TWL LRG LVL3 (GOWN DISPOSABLE) ×2 IMPLANT
GOWN STRL REUS W/TWL LRG LVL3 (GOWN DISPOSABLE) ×1
GOWN STRL REUS W/TWL XL LVL3 (GOWN DISPOSABLE) ×2 IMPLANT
NDL HYPO 25X1 1.5 SAFETY (NEEDLE) ×2 IMPLANT
NEEDLE HYPO 25X1 1.5 SAFETY (NEEDLE) ×1 IMPLANT
NS IRRIG 1000ML POUR BTL (IV SOLUTION) ×2 IMPLANT
PACK BASIN DAY SURGERY FS (CUSTOM PROCEDURE TRAY) ×2 IMPLANT
PADDING CAST ABS COTTON 4X4 ST (CAST SUPPLIES) ×2 IMPLANT
STOCKINETTE 4X48 STRL (DRAPES) ×2 IMPLANT
SUT ETHILON 4 0 PS 2 18 (SUTURE) ×2 IMPLANT
SYR BULB EAR ULCER 3OZ GRN STR (SYRINGE) ×2 IMPLANT
SYR CONTROL 10ML LL (SYRINGE) ×2 IMPLANT
TOWEL GREEN STERILE FF (TOWEL DISPOSABLE) ×4 IMPLANT
UNDERPAD 30X36 HEAVY ABSORB (UNDERPADS AND DIAPERS) ×2 IMPLANT

## 2022-09-09 NOTE — H&P (Signed)
Kenneth Montoya is an 82 y.o. male.   Chief Complaint: carpal tunnel syndrome HPI: 82 y.o. yo male with numbness and tingling left hand.  Nocturnal symptoms. Positive nerve conduction studies. He wishes to have left carpal tunnel release.   Allergies:  Allergies  Allergen Reactions   Percocet [Oxycodone-Acetaminophen] Itching    Past Medical History:  Diagnosis Date   Arthritis    generalized   BPH (benign prostatic hyperplasia)    Cancer of skin, squamous cell    frozen    Cataract    bilateral sx   GERD (gastroesophageal reflux disease)    on meds   Hypertension    on meds   Shingles    Stroke     Past Surgical History:  Procedure Laterality Date   CATARACT EXTRACTION, BILATERAL     COLONOSCOPY  06/27/2020   COLONOSCOPY  2018   gum graft     INGUINAL HERNIA REPAIR     as infant   LUMBAR LAMINECTOMY  06/2015   POLYPECTOMY  2018   TA x 3   REVERSE TOTAL SHOULDER ARTHROPLASTY Bilateral    TONSILLECTOMY AND ADENOIDECTOMY     age 22    TOTAL KNEE ARTHROPLASTY Bilateral    TOTAL KNEE REVISION Right 12/25/2017   TRANSURETHRAL RESECTION OF PROSTATE     x 2   UPPER GASTROINTESTINAL ENDOSCOPY      Family History: Family History  Problem Relation Age of Onset   Colon polyps Mother 57   Stroke Maternal Grandfather    Colon cancer Neg Hx    Esophageal cancer Neg Hx    Rectal cancer Neg Hx    Stomach cancer Neg Hx     Social History:   reports that he has never smoked. He has never used smokeless tobacco. He reports current alcohol use of about 2.0 standard drinks of alcohol per week. He reports that he does not use drugs.  Medications: Medications Prior to Admission  Medication Sig Dispense Refill   amLODipine (NORVASC) 10 MG tablet TAKE 1 TABLET BY MOUTH AT  BEDTIME 90 tablet 3   ascorbic acid (VITAMIN C) 1000 MG tablet Take 1,000 mg by mouth at bedtime.     atorvastatin (LIPITOR) 10 MG tablet TAKE 1 TABLET(10 MG) BY MOUTH DAILY 90 tablet 1   clopidogrel  (PLAVIX) 75 MG tablet Take 1 tablet (75 mg total) by mouth daily. 90 tablet 1   DULoxetine (CYMBALTA) 30 MG capsule Take 1 capsule (30 mg total) by mouth daily. 30 capsule 5   fluticasone (FLONASE) 50 MCG/ACT nasal spray Place 2 sprays into both nostrils daily. 48 g 1   hydrochlorothiazide (MICROZIDE) 12.5 MG capsule Take 1 capsule (12.5 mg total) by mouth daily. 90 capsule 3   loratadine (CLARITIN) 10 MG tablet Take 10 mg by mouth daily.     losartan (COZAAR) 100 MG tablet TAKE 1 TABLET BY MOUTH DAILY 90 tablet 0   Melatonin 3 MG SUBL Place under the tongue. Daily at bedtime     naproxen sodium (ALEVE) 220 MG tablet Take 440 mg by mouth 2 (two) times daily with a meal.     olopatadine (PATANOL) 0.1 % ophthalmic solution Place 1 drop into both eyes at bedtime.     omeprazole (PRILOSEC) 40 MG capsule TAKE 1 CAPSULE BY MOUTH DAILY 90 capsule 0   tamsulosin (FLOMAX) 0.4 MG CAPS capsule Take 0.4 mg by mouth at bedtime.     traZODone (DESYREL) 50 MG tablet Take 50 mg  by mouth at bedtime.     acetaminophen (TYLENOL) 500 MG tablet Take 1,000 mg by mouth in the morning and at bedtime.     PREVIDENT 5000 PLUS 1.1 % CREA dental cream SMARTSIG:Application By Mouth     psyllium (METAMUCIL) 58.6 % powder Take 1 packet by mouth at bedtime.     tadalafil (CIALIS) 5 MG tablet Take 5 mg by mouth daily as needed for erectile dysfunction.      No results found for this or any previous visit (from the past 48 hour(s)).  No results found.    Height  (1.727 m), weight 86.2 kg.  General appearance: alert, cooperative, and appears stated age Head: Normocephalic, without obvious abnormality, atraumatic Neck: supple, symmetrical, trachea midline Extremities: Intact sensation and capillary refill all digits.  +epl/fpl/io.  No wounds.  Pulses: 2+ and symmetric Skin: Skin color, texture, turgor normal. No rashes or lesions Neurologic: Grossly normal Incision/Wound: none  Assessment/Plan Left carpal  tunnel syndrome.  Non operative and operative treatment options have been discussed with the patient and patient wishes to proceed with operative treatment. Risks, benefits, and alternatives of surgery have been discussed and the patient agrees with the plan of care.   Betha Loa 09/09/2022, 12:24 PM

## 2022-09-09 NOTE — Transfer of Care (Signed)
Immediate Anesthesia Transfer of Care Note  Patient: Kenneth Montoya  Procedure(s) Performed: LEFT CARPAL TUNNEL RELEASE (Left: Hand)  Patient Location: PACU  Anesthesia Type:MAC and Bier block  Level of Consciousness: awake, alert , and oriented  Airway & Oxygen Therapy: Patient Spontanous Breathing and Patient connected to face mask oxygen  Post-op Assessment: Report given to RN and Post -op Vital signs reviewed and stable  Post vital signs: Reviewed and stable  Last Vitals:  Vitals Value Taken Time  BP 140/65 09/09/22 1502  Temp    Pulse 64 09/09/22 1504  Resp 13 09/09/22 1504  SpO2 96 % 09/09/22 1504  Vitals shown include unvalidated device data.  Last Pain:  Vitals:   09/09/22 1235  TempSrc: Oral  PainSc: 0-No pain         Complications: No notable events documented.

## 2022-09-09 NOTE — Op Note (Signed)
09/09/2022 [facility]                              OPERATIVE REPORT   PREOPERATIVE DIAGNOSIS:  Left carpal tunnel syndrome.  POSTOPERATIVE DIAGNOSIS:  Left carpal tunnel syndrome.  PROCEDURE:  Left carpal tunnel release.  SURGEON:  Betha Loa, MD  ASSISTANT:  none.  ANESTHESIA: Bier block with sedation  IV FLUIDS:  Per anesthesia flow sheet.  ESTIMATED BLOOD LOSS:  Minimal.  COMPLICATIONS:  None.  SPECIMENS:  None.  TOURNIQUET TIME:    Total Tourniquet Time Documented: Forearm (Left) - 22 minutes Total: Forearm (Left) - 22 minutes   DISPOSITION:  Stable to PACU.  LOCATION: [facility]  INDICATIONS:  82 y.o. yo male with numbness and tingling left hand.  Positive nerve conduction studies. He wishes to have left carpal tunnel release.  He wishes to have a carpal tunnel release for management of his symptoms.  Risks, benefits and alternatives of surgery were discussed including the risk of blood loss; infection; damage to nerves, vessels, tendons, ligaments, bone; failure of surgery; need for additional surgery; complications with wound healing; continued pain; recurrence of carpal tunnel syndrome; and damage to motor branch. He voiced understanding of these risks and elected to proceed.   OPERATIVE COURSE:  After being identified preoperatively by myself, the patient and I agreed upon the procedure and site of procedure.  The surgical site was marked.  Surgical consent had been signed.  He was given IV Ancef as preoperative antibiotic prophylaxis.  He was transferred to the operating room and placed on the operating room table in supine position with the Left upper extremity on an armboard.  Bier block anesthesia was induced by the anesthesiologist.  Left upper extremity was prepped and draped in normal sterile orthopaedic fashion.  A surgical pause was performed between the surgeons, anesthesia, and operating room staff, and all were in agreement as  to the patient, procedure, and site of procedure.  Tourniquet at the proximal aspect of the forearm had been inflated for the Bier block  Incision was made over the transverse carpal ligament and carried into the subcutaneous tissues by spreading technique.  Bipolar electrocautery was used to obtain hemostasis.  The palmar fascia was sharply incised.  The transverse carpal ligament was identified.  The fascia distal to the ligament was opened.  Retractor was placed and the flexor tendons were identified.  The flexor tendon to the ring finger was identified and retracted radially.  The transverse carpal ligament was then incised from distal to proximal under direct visualization.  Scissors were used to split the distal aspect of the volar antebrachial fascia.  A finger was placed into the wound to ensure complete decompression, which was the case.  The nerve was examined.  There was a persistent median artery.  The motor branch was identified and was intact.  The wound was copiously irrigated with sterile saline.  It was then closed with 4-0 nylon in a horizontal mattress fashion.  It was injected with 0.25% plain Marcaine to aid in postoperative analgesia.  It was dressed with sterile Xeroform, 4x4s, an ABD, and wrapped with Kerlix and an Ace bandage.  Tourniquet was deflated at 22 minutes.  Fingertips were pink with brisk capillary refill after deflation of the tourniquet.  Operative drapes were broken down.  The patient was awoken from anesthesia safely.  He was transferred back to stretcher and taken  to the PACU in stable condition.  I will see him back in the office in 1 week for postoperative followup.  I will give him a prescription for Norco 5/325 1 tab PO q6 hours prn pain, dispense # 15.    Betha Loa, MD Electronically signed, 09/09/22

## 2022-09-09 NOTE — Anesthesia Procedure Notes (Signed)
Anesthesia Regional Block: Bier block (IV Regional)   Pre-Anesthetic Checklist: , timeout performed,  Correct Patient, Correct Site, Correct Laterality,  Correct Procedure, Correct Position, site marked,  Risks and benefits discussed,  Surgical consent,  Pre-op evaluation,  At surgeon's request and post-op pain management  Laterality: Left  Prep: alcohol swabs       Needles:        Needle Gauge: 22     Additional Needles:   Procedures:,,,,, intact distal pulses, Esmarch exsanguination,  Single tourniquet utilized,  #20gu IV placed    Narrative:  CRNA: Karen Kitchens, CRNA

## 2022-09-09 NOTE — Anesthesia Postprocedure Evaluation (Signed)
Anesthesia Post Note  Patient: Kenneth Montoya  Procedure(s) Performed: LEFT CARPAL TUNNEL RELEASE (Left: Hand)     Patient location during evaluation: PACU Anesthesia Type: MAC Level of consciousness: awake and alert Pain management: pain level controlled Vital Signs Assessment: post-procedure vital signs reviewed and stable Respiratory status: spontaneous breathing, nonlabored ventilation and respiratory function stable Cardiovascular status: stable and blood pressure returned to baseline Postop Assessment: no apparent nausea or vomiting Anesthetic complications: no   No notable events documented.  Last Vitals:  Vitals:   09/09/22 1503 09/09/22 1515  BP:  (!) 149/66  Pulse: 66 67  Resp:  14  Temp: 36.6 C   SpO2: 96% 94%    Last Pain:  Vitals:   09/09/22 1515  TempSrc:   PainSc: Asleep                 Jalissa Heinzelman A.

## 2022-09-09 NOTE — Discharge Instructions (Addendum)

## 2022-09-09 NOTE — Anesthesia Preprocedure Evaluation (Signed)
Anesthesia Evaluation  Patient identified by MRN, date of birth, ID band Patient awake    Reviewed: Allergy & Precautions, NPO status , Patient's Chart, lab work & pertinent test results, reviewed documented beta blocker date and time   Airway Mallampati: II  TM Distance: >3 FB Neck ROM: Limited    Dental no notable dental hx. (+) Dental Advisory Given   Pulmonary neg pulmonary ROS   Pulmonary exam normal breath sounds clear to auscultation       Cardiovascular hypertension, Pt. on medications + Peripheral Vascular Disease   Rhythm:Regular Rate:Normal     Neuro/Psych  PSYCHIATRIC DISORDERS Anxiety     Glaucoma TIA Neuromuscular disease CVA    GI/Hepatic Neg liver ROS,GERD  Medicated,,  Endo/Other  Hyperlipidemia  Renal/GU negative Renal ROS   BPH    Musculoskeletal  (+) Arthritis , Osteoarthritis,  Left carpal tunnel syndrome   Abdominal  (+) + obese  Peds  Hematology Plavix therapy- last dose this am   Anesthesia Other Findings   Reproductive/Obstetrics                              Anesthesia Physical Anesthesia Plan  ASA: 3  Anesthesia Plan: Bier Block and MAC and Bier Block-Lidocaine Only   Post-op Pain Management: Minimal or no pain anticipated   Induction: Intravenous  PONV Risk Score and Plan: 2 and Treatment may vary due to age or medical condition, Propofol infusion and Ondansetron  Airway Management Planned: Natural Airway and Simple Face Mask  Additional Equipment: None  Intra-op Plan:   Post-operative Plan:   Informed Consent: I have reviewed the patients History and Physical, chart, labs and discussed the procedure including the risks, benefits and alternatives for the proposed anesthesia with the patient or authorized representative who has indicated his/her understanding and acceptance.     Dental advisory given  Plan Discussed with: CRNA and  Anesthesiologist  Anesthesia Plan Comments:          Anesthesia Quick Evaluation

## 2022-09-10 ENCOUNTER — Encounter (HOSPITAL_BASED_OUTPATIENT_CLINIC_OR_DEPARTMENT_OTHER): Payer: Self-pay | Admitting: Orthopedic Surgery

## 2022-10-05 ENCOUNTER — Other Ambulatory Visit: Payer: Self-pay | Admitting: Internal Medicine

## 2022-10-05 DIAGNOSIS — K219 Gastro-esophageal reflux disease without esophagitis: Secondary | ICD-10-CM

## 2022-10-05 DIAGNOSIS — I10 Essential (primary) hypertension: Secondary | ICD-10-CM

## 2022-10-07 ENCOUNTER — Ambulatory Visit (INDEPENDENT_AMBULATORY_CARE_PROVIDER_SITE_OTHER): Payer: Medicare Other | Admitting: Sports Medicine

## 2022-10-07 VITALS — BP 132/82 | HR 113 | Ht 68.0 in | Wt 201.0 lb

## 2022-10-07 DIAGNOSIS — M7061 Trochanteric bursitis, right hip: Secondary | ICD-10-CM

## 2022-10-07 DIAGNOSIS — M160 Bilateral primary osteoarthritis of hip: Secondary | ICD-10-CM

## 2022-10-07 NOTE — Patient Instructions (Addendum)
Good to see you  Voltaren gel over areas of pain  3-4 week follow up

## 2022-10-07 NOTE — Progress Notes (Signed)
Kenneth Montoya [name] [address] [place] [address] Phone: [phone]   Assessment and Plan:     1. Bilateral primary osteoarthritis of hip 2. Greater trochanteric bursitis of right hip  -Chronic with exacerbation, subsequent visit - Recurrent flare of right hip pain most consistent with greater trochanteric bursitis based on presentation and physical exam today - May use Tylenol as needed for day-to-day pain relief - Recommend using Voltaren gel topically over area of pain - Patient elected for greater trochanteric CSI.  Tolerated well per note below.  CSI may temporarily increase blood pressure in patient with past medical history of hypertension  Procedure: Greater trochanteric bursal injection Side: Right  Risks explained and consent was given verbally. The site was cleaned with alcohol prep. A steroid injection was performed with patient in the lateral side-lying position at area of maximum tenderness over greater trochanter using 2mL of 1% lidocaine without epinephrine and 1mL of kenalog 40mg /ml. This was well tolerated and resulted in symptomatic relief.  Needle was removed, hemostasis achieved, and post injection instructions were explained.  Pt was advised to call or return to clinic if these symptoms worsen or fail to improve as anticipated.    Pertinent previous records reviewed include none   Follow Up: 3 to 4 weeks for reevaluation.  If no improvement or worsening symptoms, could consider intra-articular hip injection   Subjective:   I, Kenneth Montoya, am serving as a Neurosurgeon for Doctor Richardean Sale   Chief Complaint: left hip pain     HPI:    10/11/2021 Patient is a 82 year old male complaining of bilateral hip pain. Patient states that the right hip is more painful states he has bursitis as well, hip pain has been going on for a couple of months that has progressively gotten worse, pain is in the groin and the  lateral hip, has been taking 800 mg 2 times a day of aleve or naproxen 2 acetaminophen 2000 mg daily  , doesn't really have any pain 2 aleve and 4 tylenol , has been working out walking and stretching light lifting    10/23/2021 Patient states that he has want some xray for his spinal stenosis 2017 he had a lumbar something... would like some HEP for strength and stretching    11/14/2021 Patient states that the bursitis is gone    12/12/2021 Patient states left hip is bothering him he is TTP on his lateral hip/ glute area   01/16/2022 Patient states that he doing well, took a week off from the gym    10/07/2022 Patient states that he started having Right hip pain, about 2 weeks, has pain when he walks , pain in his glute that does not radiate, tylenol twice a day and that doesn't help much, nom numbness or tingling, Aspercreme for the pain and that helps a little, I not able to walk due to the pain , antalgic gait,     Relevant Historical Information: Hypertension, history of CVA, GERD  Additional pertinent review of systems negative.   Current Outpatient Medications:    amLODipine (NORVASC) 10 MG tablet, TAKE 1 TABLET BY MOUTH AT  BEDTIME, Disp: 90 tablet, Rfl: 3   ascorbic acid (VITAMIN C) 1000 MG tablet, Take 1,000 mg by mouth at bedtime., Disp: , Rfl:    atorvastatin (LIPITOR) 10 MG tablet, TAKE 1 TABLET(10 MG) BY MOUTH DAILY, Disp: 90 tablet, Rfl: 1   clopidogrel (PLAVIX) 75 MG tablet, Take  1 tablet (75 mg total) by mouth daily., Disp: 90 tablet, Rfl: 1   DULoxetine (CYMBALTA) 30 MG capsule, Take 1 capsule (30 mg total) by mouth daily., Disp: 30 capsule, Rfl: 5   fluticasone (FLONASE) 50 MCG/ACT nasal spray, Place 2 sprays into both nostrils daily., Disp: 48 g, Rfl: 1   hydrochlorothiazide (MICROZIDE) 12.5 MG capsule, Take 1 capsule (12.5 mg total) by mouth daily., Disp: 90 capsule, Rfl: 3   HYDROcodone-acetaminophen (NORCO/VICODIN) 5-325 MG tablet, 1 tab PO q6 hours prn pain, Disp: 15  tablet, Rfl: 0   loratadine (CLARITIN) 10 MG tablet, Take 10 mg by mouth daily., Disp: , Rfl:    losartan (COZAAR) 100 MG tablet, TAKE 1 TABLET BY MOUTH DAILY, Disp: 90 tablet, Rfl: 0   Melatonin 3 MG SUBL, Place under the tongue. Daily at bedtime, Disp: , Rfl:    naproxen sodium (ALEVE) 220 MG tablet, Take 440 mg by mouth 2 (two) times daily with a meal., Disp: , Rfl:    olopatadine (PATANOL) 0.1 % ophthalmic solution, Place 1 drop into both eyes at bedtime., Disp: , Rfl:    omeprazole (PRILOSEC) 40 MG capsule, TAKE 1 CAPSULE BY MOUTH DAILY, Disp: 90 capsule, Rfl: 0   PREVIDENT 5000 PLUS 1.1 % CREA dental cream, SMARTSIG:Application By Mouth, Disp: , Rfl:    psyllium (METAMUCIL) 58.6 % powder, Take 1 packet by mouth at bedtime., Disp: , Rfl:    tadalafil (CIALIS) 5 MG tablet, Take 5 mg by mouth daily as needed for erectile dysfunction., Disp: , Rfl:    tamsulosin (FLOMAX) 0.4 MG CAPS capsule, Take 0.4 mg by mouth at bedtime., Disp: , Rfl:    traZODone (DESYREL) 50 MG tablet, Take 50 mg by mouth at bedtime., Disp: , Rfl:    Objective:     Vitals:   10/07/22 1547  BP: 132/82  Pulse: (!) 113  SpO2: 97%  Weight: 201 lb (91.2 kg)  Height: 5\' 8"  (1.727 m)      Body mass index is 30.56 kg/m.    Physical Exam:    General: awake, alert, and oriented no acute distress, nontoxic Skin: no suspicious lesions or rashes Neuro:sensation intact distally with no deficits, normal muscle tone, no atrophy, strength 5/5 in all tested lower ext groups Psych: normal mood and affect, speech clear   Right hip: No deformity, swelling or wasting ROM Flexion 80, ext 20, IR 30, ER 40 TTP greater trochanter and gluteal musculature NTTP over the hip flexors,   si joint, lumbar spine Negative log roll with FROM Positive FABER for lateral hip pain Positive FADIR for lateral hip pain Gait antalgic, favoring left leg  Electronically signed by:  Kenneth Montoya [name] 4:46 PM  10/07/22

## 2022-10-10 ENCOUNTER — Telehealth: Payer: Self-pay | Admitting: Sports Medicine

## 2022-10-10 NOTE — Telephone Encounter (Addendum)
Patient's daughter came in in regards to Kenneth Montoya's hip pain. He was here to see Dr Jean Rosenthal and has an injection on Monday. The pain was much better, if not gone, on Tuesday and Wednesday but he has started complaining of hip pain today. He is wanting to have a hip replacement but his daughter was hesitant on that based on the discussion with Dr Jean Rosenthal on Monday.  Any thoughts on if surgery would be the best next step?  Their daughter was almost tearful has been very overwhelmed with worsening memory issues of both parents.  After she left, Kenneth Montoya came in (they did not know the other was coming).  He explained the same issues as she did and said that the hip pain has been keeping him up at night at this point. He was confused about what day his appointment was with Dr Jean Rosenthal but is wanting to see a surgeon to do a hip replacement.   Please advise.   [name]: Daughter [phone]

## 2022-10-15 NOTE — Telephone Encounter (Signed)
Spoke to patient and relayed Dr Edison Pace message. He would like to see Dr Jean Rosenthal and proceed with the hip joint injection.  Added note to currently scheduled appointment.

## 2022-10-22 NOTE — Progress Notes (Signed)
Kenneth [facility]oya D.[facility] [address] [place] [address] Phone: [phone]   Assessment and Plan:     1.  Right primary osteoarthritis of hip 2. Right hip pain -Chronic with exacerbation, subsequent visit - Recurrent flare of right hip pain.  Most consistent with intra-articular hip pathology and moderate osteoarthritis as seen on prior x-ray - Patient received a few days relief after greater trochanteric CSI performed at previous office visit on 10/07/2022, however pain had completely returned to prior levels within a week - Patient elected for repeat intra-articular hip CSI.  Tolerated well per note below.  CSI may temporarily increase blood pressure in patient with past medical history of hypertension - May use Tylenol as needed for day-to-day pain relief - [name] LIMITED JOINT SPACE STRUCTURES LOW RIGHT(NO LINKED CHARGES)    Procedure: Ultrasound Guided Hip Acetabulofemoral Joint Injection Side: Right Diagnosis: Flare of right hip osteoarthritis [name] Indication:  - accuracy is paramount for diagnosis - to ensure therapeutic efficacy or procedural success - to reduce procedural risk  After explaining the procedure, viable alternatives, risks, and answering any questions, consent was given verbally. The site was cleaned with chlorhexidine prep. An ultrasound transducer was placed on the anterior thigh/hip.   The acetabular joint, labrum, and femoral shaft were identified.  The neurovascular structures were identified and an approach was found specifically avoiding these structures.  A steroid injection was performed under ultrasound guidance with sterile technique using 2ml of 1% lidocaine without epinephrine and 40 mg of triamcinolone (KENALOG) 40mg /ml. This was well tolerated and resulted in  relief.  Needle was removed and dressing placed and post injection instructions were given including  a discussion of likely return of pain today after the  anesthetic wears off (with the possibility of worsened pain) until the steroid starts to work in 1-3 days.   Pt was advised to call or return to clinic if these symptoms worsen or fail to improve as anticipated.   Pertinent previous records reviewed include none   Follow Up: 2 to 3 weeks for reevaluation.  If no improvement or worsening of symptoms, would discuss orthopedic surgery referral   Subjective:   I, Kenneth Montoya, am serving as a Neurosurgeon for Doctor Richardean Sale   Chief Complaint: left hip pain     HPI:    10/11/2021 Patient is a 82 year old male complaining of bilateral hip pain. Patient states that the right hip is more painful states he has bursitis as well, hip pain has been going on for a couple of months that has progressively gotten worse, pain is in the groin and the lateral hip, has been taking 800 mg 2 times a day of aleve or naproxen 2 acetaminophen 2000 mg daily  , doesn't really have any pain 2 aleve and 4 tylenol , has been working out walking and stretching light lifting    10/23/2021 Patient states that he has want some xray for his spinal stenosis 2017 he had a lumbar something... would like some HEP for strength and stretching    11/14/2021 Patient states that the bursitis is gone    12/12/2021 Patient states left hip is bothering him he is TTP on his lateral hip/ glute area   01/16/2022 Patient states that he doing well, took a week off from the gym    10/07/2022 Patient states that he started having Right hip pain, about 2 weeks, has pain when he walks , pain in his glute  that does not radiate, tylenol twice a day and that doesn't help much, nom numbness or tingling, Aspercreme for the pain and that helps a little, I not able to walk due to the pain , antalgic gait,    10/28/2022 Patient states his hip is in pain even when he is asleep and sitting   Relevant Historical Information: Hypertension, history of CVA, GERD  Additional pertinent review of  systems negative.   Current Outpatient Medications:    amLODipine (NORVASC) 10 MG tablet, TAKE 1 TABLET BY MOUTH AT  BEDTIME, Disp: 90 tablet, Rfl: 3   ascorbic acid (VITAMIN C) 1000 MG tablet, Take 1,000 mg by mouth at bedtime., Disp: , Rfl:    atorvastatin (LIPITOR) 10 MG tablet, TAKE 1 TABLET(10 MG) BY MOUTH DAILY, Disp: 90 tablet, Rfl: 1   clopidogrel (PLAVIX) 75 MG tablet, Take 1 tablet (75 mg total) by mouth daily., Disp: 90 tablet, Rfl: 1   DULoxetine (CYMBALTA) 30 MG capsule, Take 1 capsule (30 mg total) by mouth daily., Disp: 30 capsule, Rfl: 5   fluticasone (FLONASE) 50 MCG/ACT nasal spray, Place 2 sprays into both nostrils daily., Disp: 48 g, Rfl: 1   hydrochlorothiazide (MICROZIDE) 12.5 MG capsule, Take 1 capsule (12.5 mg total) by mouth daily., Disp: 90 capsule, Rfl: 3   HYDROcodone-acetaminophen (NORCO/VICODIN) 5-325 MG tablet, 1 tab PO q6 hours prn pain, Disp: 15 tablet, Rfl: 0   loratadine (CLARITIN) 10 MG tablet, Take 10 mg by mouth daily., Disp: , Rfl:    losartan (COZAAR) 100 MG tablet, TAKE 1 TABLET BY MOUTH DAILY, Disp: 90 tablet, Rfl: 0   olopatadine (PATANOL) 0.1 % ophthalmic solution, Place 1 drop into both eyes at bedtime., Disp: , Rfl:    PREVIDENT 5000 PLUS 1.1 % CREA dental cream, SMARTSIG:Application By Mouth, Disp: , Rfl:    psyllium (METAMUCIL) 58.6 % powder, Take 1 packet by mouth at bedtime., Disp: , Rfl:    RABEprazole (ACIPHEX) 20 MG tablet, Take 1 tablet (20 mg total) by mouth daily., Disp: 90 tablet, Rfl: 1   tamsulosin (FLOMAX) 0.4 MG CAPS capsule, Take 0.4 mg by mouth at bedtime., Disp: , Rfl:    traZODone (DESYREL) 50 MG tablet, Take 50 mg by mouth at bedtime., Disp: , Rfl:    Objective:     Vitals:   10/28/22 1537  BP: (!) 120/56  Pulse: 85  SpO2: 95%  Weight: 200 lb (90.7 kg)  Height: 5\' 8"  (1.727 m)      Body mass index is 30.41 kg/m.    Physical Exam:    General: awake, alert, and oriented no acute distress, nontoxic Skin: no  suspicious lesions or rashes Neuro:sensation intact distally with no deficits, normal muscle tone, no atrophy, strength 5/5 in all tested lower ext groups Psych: normal mood and affect, speech clear   Right hip: No deformity, swelling or wasting ROM Flexion 80, ext 20, IR 30, ER 40 TTP greater trochanter and gluteal musculature NTTP over the hip flexors,   si joint, lumbar spine Negative log roll with FROM Positive FABER for lateral hip pain Positive FADIR for lateral hip pain Gait antalgic, favoring left leg      Electronically signed by:  Kenneth [facility]oya [facility] 4:09 PM 10/28/22

## 2022-10-23 ENCOUNTER — Other Ambulatory Visit: Payer: Self-pay | Admitting: Internal Medicine

## 2022-10-23 DIAGNOSIS — K219 Gastro-esophageal reflux disease without esophagitis: Secondary | ICD-10-CM

## 2022-10-23 MED ORDER — RABEPRAZOLE SODIUM 20 MG PO TBEC
20.0000 mg | DELAYED_RELEASE_TABLET | Freq: Every day | ORAL | 1 refills | Status: DC
Start: 2022-10-23 — End: 2023-05-09

## 2022-10-24 NOTE — Progress Notes (Unsigned)
NEUROLOGY FOLLOW UP OFFICE NOTE  Kenneth Montoya [id number]  Assessment/Plan:   Left carpal tunnel syndrome History of TIA - transient episode suggestive of TIA rather than L5 radiculopathy vs peroneal neuropathy.. - recent CTA of head does not reveal PCA stenosis, making possible cardioembolic source even more plausible for previous stroke.   History of right occipital lobe infarct, likely embolic of unknown source - I don't appreciate visual field defect on gross exam. Hypertension   For neuralgia,continue duloxetine 30mg  daily.   Secondary stroke prevention as managed by PCP: Plavix 75mg  daily Statin.  LDL goal less than 70.   Hgb A1c goal less than 7 Normotensive blood pressure Follow up 9 months.     Subjective:  Kenneth Montoya is an 82 year old right-handed male with HTN, OA, SIADH and bilateral knee replacement who follows up for TIA.  UPDATE: Current medications:  Plavix 75mg , atorvastatin 10mg , amlodipine, losartan, duloxetine 30mg  daily  For left upper extremity neuralgia, started duloxetine.  Followed up with ortho and found to have carpal tunnel syndrome in the left hand.  He had a carpal tunnel release which has helped.   HISTORY:  He was admitted to [facility] on 12/09/2020 for stroke. He presented with transient left sided vision loss.  MRI of brain showed acute right medial occipital lobe infarct as well as extensive chronic small vessel ischemic changes including remote cortical infarct of the posterior right middle frontal gyrus.  MRA of head and neck showed severe distal left P2 and right P3 stenoses as well as 40-50% proximal right ICA stenosis.  LDL was 86 and Hgb A1c was 5.8.  He was found to be positive for COVID.  Echocardiogram was never performed as patient desired to be discharged.  He was discharged on dual antiplatelet therapy for 21 days followed by ASA alone as well as started on atorvastatin 40mg  daily.  Repeat LDL about a week later was  50.  However, he subsequently discontinued the atorvastatin because he felt dizzy and weak.  Outpatient 30 day cardiac event monitor was ordered but he has declined.  Still has a blind spot.  He has followed up with the eye doctor.  No residual symptoms from that stroke.  On 01/18/2022, he had transient right lower extremity weakness.  No pain or numbness.  Presented to [facility].  CT head revealed interval chronic inferior left cerebellar and left superior cerebellar infarcts.  MRI of brain revealed old right occipital and left cerebellar infarcts but no acute findings.  CTA head and neck revealed no LVO or hemodynamically significant stenosis.  2D echo showed EF 60-65%.  LDL was 60 and Hgb A1c 5.4.  History of lumbar surgery and questioned if foot drop was due to L5 radiculopathy or peroneal nerve impingement.  MRI of lumbar spine showed status post L2-S1 posterior decompression with improved patence but did reveal severe right L4 and L5 foraminal stenosis.  Advised to continue Plavix.  He has total reverse shoulder and surgery.  Has residual numbness and discomfort in the left hand (tips of all fingers and thumb) but not arm.  It prevents him from sleeping.  Previous side effects to gabapentin.    PAST MEDICAL HISTORY: Past Medical History:  Diagnosis Date   Arthritis    generalized   BPH (benign prostatic hyperplasia)    Cancer of skin, squamous cell    frozen    Cataract    bilateral sx   GERD (gastroesophageal reflux disease)  on meds   Hypertension    on meds   Shingles    Stroke [facility])     MEDICATIONS: Current Outpatient Medications on File Prior to Visit  Medication Sig Dispense Refill   amLODipine (NORVASC) 10 MG tablet TAKE 1 TABLET BY MOUTH AT  BEDTIME 90 tablet 3   ascorbic acid (VITAMIN C) 1000 MG tablet Take 1,000 mg by mouth at bedtime.     atorvastatin (LIPITOR) 10 MG tablet TAKE 1 TABLET(10 MG) BY MOUTH DAILY 90 tablet 1   clopidogrel (PLAVIX) 75 MG tablet  Take 1 tablet (75 mg total) by mouth daily. 90 tablet 1   DULoxetine (CYMBALTA) 30 MG capsule Take 1 capsule (30 mg total) by mouth daily. 30 capsule 5   fluticasone (FLONASE) 50 MCG/ACT nasal spray Place 2 sprays into both nostrils daily. 48 g 1   hydrochlorothiazide (MICROZIDE) 12.5 MG capsule Take 1 capsule (12.5 mg total) by mouth daily. 90 capsule 3   HYDROcodone-acetaminophen (NORCO/VICODIN) 5-325 MG tablet 1 tab PO q6 hours prn pain 15 tablet 0   loratadine (CLARITIN) 10 MG tablet Take 10 mg by mouth daily.     losartan (COZAAR) 100 MG tablet TAKE 1 TABLET BY MOUTH DAILY 90 tablet 0   Melatonin 3 MG SUBL Place under the tongue. Daily at bedtime     olopatadine (PATANOL) 0.1 % ophthalmic solution Place 1 drop into both eyes at bedtime.     PREVIDENT 5000 PLUS 1.1 % CREA dental cream SMARTSIG:Application By Mouth     psyllium (METAMUCIL) 58.6 % powder Take 1 packet by mouth at bedtime.     RABEprazole (ACIPHEX) 20 MG tablet Take 1 tablet (20 mg total) by mouth daily. 90 tablet 1   tadalafil (CIALIS) 5 MG tablet Take 5 mg by mouth daily as needed for erectile dysfunction.     tamsulosin (FLOMAX) 0.4 MG CAPS capsule Take 0.4 mg by mouth at bedtime.     traZODone (DESYREL) 50 MG tablet Take 50 mg by mouth at bedtime.     No current facility-administered medications on file prior to visit.    ALLERGIES: Allergies  Allergen Reactions   Percocet [Oxycodone-Acetaminophen] Itching    FAMILY HISTORY: Family History  Problem Relation Age of Onset   Colon polyps Mother 6   Stroke Maternal Grandfather    Colon cancer Neg Hx    Esophageal cancer Neg Hx    Rectal cancer Neg Hx    Stomach cancer Neg Hx       Objective:  Blood pressure (!) 120/56, pulse 85, height 5\' 8"  (1.727 m), weight 200 lb 6.4 oz (90.9 kg), SpO2 98 %. General: No acute distress.  Patient appears well-groomed.   Head:  Normocephalic/atraumatic Eyes:  Fundi examined but not visualized Neck: supple, no paraspinal  tenderness, full range of motion Heart:  Regular rate and rhythm Neurological Exam: alert and oriented.  Speech fluent and not dysarthric, language intact.  CN II-XII intact. Bulk and tone normal, muscle strength 5/5 throughout.  Sensation to light touch intact.  Deep tendon reflexes 2+ throughout.  Finger to nose testing intact.  Gait normal, Romberg negative.   Shon Millet, DO  CC: Sanda Linger, MD

## 2022-10-28 ENCOUNTER — Ambulatory Visit (INDEPENDENT_AMBULATORY_CARE_PROVIDER_SITE_OTHER): Payer: Medicare Other | Admitting: Sports Medicine

## 2022-10-28 ENCOUNTER — Ambulatory Visit (INDEPENDENT_AMBULATORY_CARE_PROVIDER_SITE_OTHER): Payer: Medicare Other | Admitting: Neurology

## 2022-10-28 ENCOUNTER — Other Ambulatory Visit: Payer: Self-pay

## 2022-10-28 VITALS — BP 120/56 | HR 85 | Ht 68.0 in | Wt 200.4 lb

## 2022-10-28 VITALS — BP 120/56 | HR 85 | Ht 68.0 in | Wt 200.0 lb

## 2022-10-28 DIAGNOSIS — M25551 Pain in right hip: Secondary | ICD-10-CM | POA: Diagnosis not present

## 2022-10-28 DIAGNOSIS — G459 Transient cerebral ischemic attack, unspecified: Secondary | ICD-10-CM | POA: Diagnosis not present

## 2022-10-28 DIAGNOSIS — M792 Neuralgia and neuritis, unspecified: Secondary | ICD-10-CM

## 2022-10-28 DIAGNOSIS — M160 Bilateral primary osteoarthritis of hip: Secondary | ICD-10-CM

## 2022-10-28 DIAGNOSIS — M1611 Unilateral primary osteoarthritis, right hip: Secondary | ICD-10-CM

## 2022-10-28 NOTE — Patient Instructions (Signed)
Tylenol as needed 2-3 week follow up

## 2022-10-29 ENCOUNTER — Encounter: Payer: Self-pay | Admitting: Neurology

## 2022-11-08 NOTE — Progress Notes (Deleted)
Aleen Sells [name] [address] [place] [address] Phone: [phone]   Assessment and Plan:     There are no diagnoses linked to this encounter.  ***   Pertinent previous records reviewed include ***   Follow Up: ***     Subjective:   I, Avi Kerschner, am serving as a Neurosurgeon for Doctor Richardean Sale   Chief Complaint: left hip pain     HPI:    10/11/2021 Patient is a 82 year old male complaining of bilateral hip pain. Patient states that the right hip is more painful states he has bursitis as well, hip pain has been going on for a couple of months that has progressively gotten worse, pain is in the groin and the lateral hip, has been taking 800 mg 2 times a day of aleve or naproxen 2 acetaminophen 2000 mg daily  , doesn't really have any pain 2 aleve and 4 tylenol , has been working out walking and stretching light lifting    10/23/2021 Patient states that he has want some xray for his spinal stenosis 2017 he had a lumbar something... would like some HEP for strength and stretching    11/14/2021 Patient states that the bursitis is gone    12/12/2021 Patient states left hip is bothering him he is TTP on his lateral hip/ glute area   01/16/2022 Patient states that he doing well, took a week off from the gym    10/07/2022 Patient states that he started having Right hip pain, about 2 weeks, has pain when he walks , pain in his glute that does not radiate, tylenol twice a day and that doesn't help much, nom numbness or tingling, Aspercreme for the pain and that helps a little, I not able to walk due to the pain , antalgic gait,    10/28/2022 Patient states his hip is in pain even when he is asleep and sitting   11/11/2022 Patient states    Relevant Historical Information: Hypertension, history of CVA, GERD  Additional pertinent review of systems negative.   Current Outpatient Medications:    amLODipine (NORVASC) 10 MG  tablet, TAKE 1 TABLET BY MOUTH AT  BEDTIME, Disp: 90 tablet, Rfl: 3   ascorbic acid (VITAMIN C) 1000 MG tablet, Take 1,000 mg by mouth at bedtime., Disp: , Rfl:    atorvastatin (LIPITOR) 10 MG tablet, TAKE 1 TABLET(10 MG) BY MOUTH DAILY, Disp: 90 tablet, Rfl: 1   clopidogrel (PLAVIX) 75 MG tablet, Take 1 tablet (75 mg total) by mouth daily., Disp: 90 tablet, Rfl: 1   DULoxetine (CYMBALTA) 30 MG capsule, Take 1 capsule (30 mg total) by mouth daily., Disp: 30 capsule, Rfl: 5   fluticasone (FLONASE) 50 MCG/ACT nasal spray, Place 2 sprays into both nostrils daily., Disp: 48 g, Rfl: 1   hydrochlorothiazide (MICROZIDE) 12.5 MG capsule, Take 1 capsule (12.5 mg total) by mouth daily., Disp: 90 capsule, Rfl: 3   HYDROcodone-acetaminophen (NORCO/VICODIN) 5-325 MG tablet, 1 tab PO q6 hours prn pain, Disp: 15 tablet, Rfl: 0   loratadine (CLARITIN) 10 MG tablet, Take 10 mg by mouth daily., Disp: , Rfl:    losartan (COZAAR) 100 MG tablet, TAKE 1 TABLET BY MOUTH DAILY, Disp: 90 tablet, Rfl: 0   olopatadine (PATANOL) 0.1 % ophthalmic solution, Place 1 drop into both eyes at bedtime., Disp: , Rfl:    PREVIDENT 5000 PLUS 1.1 % CREA dental cream, SMARTSIG:Application By Mouth, Disp: , Rfl:  psyllium (METAMUCIL) 58.6 % powder, Take 1 packet by mouth at bedtime., Disp: , Rfl:    RABEprazole (ACIPHEX) 20 MG tablet, Take 1 tablet (20 mg total) by mouth daily., Disp: 90 tablet, Rfl: 1   tamsulosin (FLOMAX) 0.4 MG CAPS capsule, Take 0.4 mg by mouth at bedtime., Disp: , Rfl:    traZODone (DESYREL) 50 MG tablet, Take 50 mg by mouth at bedtime., Disp: , Rfl:    Objective:     There were no vitals filed for this visit.    There is no height or weight on file to calculate BMI.    Physical Exam:    ***   Electronically signed by:  Aleen Sells [name] 7:09 AM 11/08/22

## 2022-11-11 ENCOUNTER — Ambulatory Visit: Payer: 59 | Admitting: Sports Medicine

## 2022-11-11 ENCOUNTER — Other Ambulatory Visit: Payer: Self-pay | Admitting: Internal Medicine

## 2022-11-11 DIAGNOSIS — I63423 Cerebral infarction due to embolism of bilateral anterior cerebral arteries: Secondary | ICD-10-CM

## 2022-11-11 DIAGNOSIS — I6300 Cerebral infarction due to thrombosis of unspecified precerebral artery: Secondary | ICD-10-CM

## 2022-11-21 ENCOUNTER — Telehealth: Payer: Self-pay | Admitting: Neurology

## 2022-11-21 NOTE — Telephone Encounter (Signed)
Pt is calling in stating that he is having tingling (Paresthesia) in his hands when he wakes up during the night.  Pt would like to know what he can do for it.

## 2022-11-22 ENCOUNTER — Telehealth: Payer: Self-pay | Admitting: Neurology

## 2022-11-22 MED ORDER — DULOXETINE HCL 30 MG PO CPEP: 30.0000 mg | ORAL_CAPSULE | Freq: Every day | ORAL | 5 refills | Status: DC

## 2022-11-22 NOTE — Telephone Encounter (Signed)
Called patient voicemail is full and I was unable to leave a message

## 2022-11-22 NOTE — Telephone Encounter (Signed)
Pt states that he needs to speak with someone about the medication Duloxetine. He states that his left hand is tingling and last night he had to take 2 pills to sleep and the medication wore off after 12 hours.   He is not sure what Dr Everlena Cooper would like to do   [facility] [address]

## 2022-12-08 ENCOUNTER — Other Ambulatory Visit: Payer: Self-pay | Admitting: Internal Medicine

## 2022-12-08 DIAGNOSIS — I10 Essential (primary) hypertension: Secondary | ICD-10-CM

## 2022-12-18 NOTE — Progress Notes (Signed)
HPI: Follow-up coronary calcification and history of carotid artery disease.  Also with history of hypertension and hyperlipidemia.  Has had previous TIA.  Previously followed by Dr. Katrinka Blazing.  Carotid Dopplers April 2021 showed 1 to 39% bilateral stenosis.  Echocardiogram August 2023 showed normal LV function, mild left ventricular hypertrophy, mild left atrial enlargement.  CTA August 2023 showed no large vessel occlusion or hemodynamically significant stenosis of the intracranial or cervical arteries.  Patient apparently had been scheduled to see Dr. Lalla Brothers for consideration of implantable loop recorder and was encouraged to wear a monitor but patient declined.  Since last seen patient denies dyspnea on exertion, orthopnea, PND, pedal edema, chest pain or syncope.  He is able to walk several blocks without having dyspnea or chest pain.  He is limited by pain in his right hip which is to be replaced in December.  Current Outpatient Medications  Medication Sig Dispense Refill   amLODipine (NORVASC) 10 MG tablet TAKE 1 TABLET BY MOUTH AT  BEDTIME 90 tablet 3   ascorbic acid (VITAMIN C) 1000 MG tablet Take 1,000 mg by mouth at bedtime.     atorvastatin (LIPITOR) 10 MG tablet TAKE 1 TABLET(10 MG) BY MOUTH DAILY 90 tablet 1   clopidogrel (PLAVIX) 75 MG tablet TAKE 1 TABLET(75 MG) BY MOUTH DAILY 90 tablet 1   DULoxetine (CYMBALTA) 30 MG capsule Take 1 capsule (30 mg total) by mouth daily. 30 capsule 5   fluticasone (FLONASE) 50 MCG/ACT nasal spray Place 2 sprays into both nostrils daily. 48 g 1   hydrochlorothiazide (MICROZIDE) 12.5 MG capsule Take 1 capsule (12.5 mg total) by mouth daily. 90 capsule 3   HYDROcodone-acetaminophen (NORCO/VICODIN) 5-325 MG tablet 1 tab PO q6 hours prn pain 15 tablet 0   loratadine (CLARITIN) 10 MG tablet Take 10 mg by mouth daily.     losartan (COZAAR) 100 MG tablet TAKE 1 TABLET BY MOUTH DAILY 90 tablet 0   olopatadine (PATANOL) 0.1 % ophthalmic solution Place 1  drop into both eyes at bedtime.     PREVIDENT 5000 PLUS 1.1 % CREA dental cream SMARTSIG:Application By Mouth     psyllium (METAMUCIL) 58.6 % powder Take 1 packet by mouth at bedtime.     RABEprazole (ACIPHEX) 20 MG tablet Take 1 tablet (20 mg total) by mouth daily. 90 tablet 1   tamsulosin (FLOMAX) 0.4 MG CAPS capsule Take 0.4 mg by mouth at bedtime.     traZODone (DESYREL) 50 MG tablet Take 50 mg by mouth at bedtime.     No current facility-administered medications for this visit.     Past Medical History:  Diagnosis Date   Arthritis    generalized   BPH (benign prostatic hyperplasia)    Cancer of skin, squamous cell    frozen    Cataract    bilateral sx   GERD (gastroesophageal reflux disease)    on meds   Hypertension    on meds   Shingles    Stroke [facility])     Past Surgical History:  Procedure Laterality Date   CARPAL TUNNEL RELEASE Left 09/09/2022   Procedure: LEFT CARPAL TUNNEL RELEASE;  Surgeon: Betha Loa, MD;  Location: [facility];  Service: Orthopedics;  Laterality: Left;  Bier block   CATARACT EXTRACTION, BILATERAL     COLONOSCOPY  06/27/2020   COLONOSCOPY  2018   gum graft     INGUINAL HERNIA REPAIR     as infant   LUMBAR  LAMINECTOMY  06/2015   POLYPECTOMY  2018   TA x 3   REVERSE TOTAL SHOULDER ARTHROPLASTY Bilateral    TONSILLECTOMY AND ADENOIDECTOMY     age 82    TOTAL KNEE ARTHROPLASTY Bilateral    TOTAL KNEE REVISION Right 12/25/2017   TRANSURETHRAL RESECTION OF PROSTATE     x 2   UPPER GASTROINTESTINAL ENDOSCOPY      Social History   Socioeconomic History   Marital status: Married    Spouse name: Not on file   Number of children: Not on file   Years of education: Not on file   Highest education level: Not on file  Occupational History   Not on file  Tobacco Use   Smoking status: Never   Smokeless tobacco: Never  Vaping Use   Vaping status: Never Used  Substance and Sexual Activity   Alcohol use: Yes    Alcohol/week:  2.0 standard drinks of alcohol    Types: 2 Standard drinks or equivalent per week    Comment: wine 1-2 x a week and not every week    Drug use: Never   Sexual activity: Not on file  Other Topics Concern   Not on file  Social History Narrative   Left handed   Social Determinants of Health   Financial Resource Strain: Low Risk  (03/04/2022)   Overall Financial Resource Strain (CARDIA)    Difficulty of Paying Living Expenses: Not hard at all  Food Insecurity: No Food Insecurity (03/04/2022)   Hunger Vital Sign    Worried About Running Out of Food in the Last Year: Never true    Ran Out of Food in the Last Year: Never true  Transportation Needs: No Transportation Needs (03/04/2022)   PRAPARE - Administrator, Civil Service (Medical): No    Lack of Transportation (Non-Medical): No  Physical Activity: Sufficiently Active (03/04/2022)   Exercise Vital Sign    Days of Exercise per Week: 7 days    Minutes of Exercise per Session: 30 min  Stress: No Stress Concern Present (03/04/2022)   [employer] of Occupational Health - Occupational Stress Questionnaire    Feeling of Stress : Not at all  Social Connections: Socially Integrated (03/04/2022)   Social Connection and Isolation Panel [NHANES]    Frequency of Communication with Friends and Family: More than three times a week    Frequency of Social Gatherings with Friends and Family: More than three times a week    Attends Religious Services: More than 4 times per year    Active Member of [employer] or Organizations: Yes    Attends Engineer, structural: More than 4 times per year    Marital Status: Married  Catering manager Violence: Not At Risk (03/04/2022)   Humiliation, Afraid, Rape, and Kick questionnaire    Fear of Current or Ex-Partner: No    Emotionally Abused: No    Physically Abused: No    Sexually Abused: No    Family History  Problem Relation Age of Onset   Colon polyps Mother 80   Stroke Maternal  Grandfather    Colon cancer Neg Hx    Esophageal cancer Neg Hx    Rectal cancer Neg Hx    Stomach cancer Neg Hx     ROS: Hip arthralgias but no fevers or chills, productive cough, hemoptysis, dysphasia, odynophagia, melena, hematochezia, dysuria, hematuria, rash, seizure activity, orthopnea, PND, pedal edema, claudication. Remaining systems are negative.  Physical Exam: Well-developed well-nourished in no  acute distress.  Skin is warm and dry.  HEENT is normal.  Neck is supple.  Chest is clear to auscultation with normal expansion.  Cardiovascular exam is irregular Abdominal exam nontender or distended. No masses palpated. Extremities show no edema. neuro grossly intact   ECG today shows sinus rhythm with PACs and right bundle branch block.  Personally reviewed.   A/P  1 coronary calcification-continue Plavix and statin.  Patient denies chest pain.  2 history of TIA-continue Plavix.  Previously declined implantable loop monitor or other monitoring device to exclude atrial fibrillation.  3 hypertension-patient's blood pressure is elevated.  Add carvedilol 6.25 mg twice daily and follow.  4 hyperlipidemia-continue Lipitor.  Check lipids and liver.  5 carotid artery disease-mild on most recent Dopplers.  Continue medical therapy.  6 preoperative evaluation prior to hip replacement-he is scheduled for hip replacement in December.  He is able to walk several blocks with no chest pain or dyspnea and is only limited by hip arthralgias.  He may proceed without further cardiac evaluation.  Olga Millers, MD

## 2022-12-25 ENCOUNTER — Telehealth: Payer: Self-pay | Admitting: Internal Medicine

## 2022-12-25 ENCOUNTER — Telehealth: Payer: Self-pay | Admitting: *Deleted

## 2022-12-25 NOTE — Telephone Encounter (Signed)
   Name: Kenneth Montoya  DOB: [birth year]  MRN: [id number]  Primary Cardiologist: Lesleigh Noe, MD (Inactive)  Chart reviewed as part of pre-operative protocol coverage. The patient has an upcoming visit scheduled with Dr. Jens Som on 12/31/2022  at which time clearance can be addressed in case there are any issues that would impact surgical recommendations.  I will route this message as FYI to requesting party and remove this message from the preop box as separate preop APP input not needed at this time.   Please call with any questions.  Napoleon Form, Leodis Rains, NP  12/25/2022, 6:15 PM

## 2022-12-25 NOTE — Telephone Encounter (Signed)
   Pre-operative Risk Assessment    Patient Name: Kenneth Montoya  DOB: 04-Sep-1940 MRN: [id number]      Request for Surgical Clearance    Procedure:   RIGHT TOTAL HIP ARTHROPLASTY  Date of Surgery:  Clearance 04/16/23                                 Surgeon:  DR. Ollen Gross Surgeon's Group or Practice Name:  [facility] Phone number:  [phone] Fax number:  [phone] ATTN: Aida Raider   Type of Clearance Requested:   - Medical ; PLAVIX   Type of Anesthesia:   CHOICE   Additional requests/questions:    Elpidio Anis   12/25/2022, 5:00 PM

## 2022-12-25 NOTE — Telephone Encounter (Signed)
We have received surgical clearance forms from [facility] and it has been placed in the providers box.   Please fax to: [phone]

## 2022-12-26 NOTE — Telephone Encounter (Signed)
Clearance has been given to PCP to review and sign

## 2022-12-30 NOTE — Telephone Encounter (Signed)
Per PCP pt needs an OV for completion.   LVM informing pt of the above.

## 2022-12-31 ENCOUNTER — Ambulatory Visit: Payer: Medicare Other | Attending: Cardiology | Admitting: Cardiology

## 2022-12-31 ENCOUNTER — Encounter: Payer: Self-pay | Admitting: Cardiology

## 2022-12-31 VITALS — BP 150/98 | HR 85 | Ht 68.0 in | Wt 197.4 lb

## 2022-12-31 DIAGNOSIS — Z01810 Encounter for preprocedural cardiovascular examination: Secondary | ICD-10-CM | POA: Diagnosis present

## 2022-12-31 DIAGNOSIS — E785 Hyperlipidemia, unspecified: Secondary | ICD-10-CM | POA: Insufficient documentation

## 2022-12-31 DIAGNOSIS — I2584 Coronary atherosclerosis due to calcified coronary lesion: Secondary | ICD-10-CM | POA: Insufficient documentation

## 2022-12-31 DIAGNOSIS — Z8673 Personal history of transient ischemic attack (TIA), and cerebral infarction without residual deficits: Secondary | ICD-10-CM | POA: Diagnosis present

## 2022-12-31 DIAGNOSIS — I2510 Atherosclerotic heart disease of native coronary artery without angina pectoris: Secondary | ICD-10-CM | POA: Insufficient documentation

## 2022-12-31 MED ORDER — CARVEDILOL 6.25 MG PO TABS
6.2500 mg | ORAL_TABLET | Freq: Two times a day (BID) | ORAL | 3 refills | Status: DC
Start: 2022-12-31 — End: 2023-05-09

## 2022-12-31 NOTE — Patient Instructions (Signed)
Medication Instructions:   START CARVEDILOL 6.25 MG ONE TABLET TWICE DAILY  *If you need a refill on your cardiac medications before your next appointment, please call your pharmacy*   Follow-Up: At [facility], you and your health needs are our priority.  As part of our continuing mission to provide you with exceptional heart care, we have created designated Provider Care Teams.  These Care Teams include your primary Cardiologist (physician) and Advanced Practice Providers (APPs -  Physician Assistants and Nurse Practitioners) who all work together to provide you with the care you need, when you need it.  We recommend signing up for the patient portal called "MyChart".  Sign up information is provided on this After Visit Summary.  MyChart is used to connect with patients for Virtual Visits (Telemedicine).  Patients are able to view lab/test results, encounter notes, upcoming appointments, etc.  Non-urgent messages can be sent to your provider as well.   To learn more about what you can do with MyChart, go to [email or web address].    Your next appointment:   6 month(s)  Provider:   Olga Millers MD

## 2023-01-03 ENCOUNTER — Encounter: Payer: Self-pay | Admitting: *Deleted

## 2023-01-28 NOTE — H&P (Signed)
TOTAL HIP ADMISSION H&P  Patient is admitted for right total hip arthroplasty.  Subjective:  Chief Complaint: Right hip pain  HPI: Kenneth Montoya, 82 y.o. male, has a history of pain and functional disability in the right hip due to arthritis and patient has failed non-surgical conservative treatments for greater than 12 weeks to include NSAID's and/or analgesics, use of assistive devices, and activity modification. Onset of symptoms was gradual, starting  several  years ago with gradually worsening course since that time. The patient noted no past surgery on the right hip. Patient currently rates pain in the right hip at 8 out of 10 with activity. Patient has night pain, worsening of pain with activity and weight bearing, pain that interfers with activities of daily living, and pain with passive range of motion. Patient has evidence of  advanced end-stage arthritis in the right hip, with bone-on-bone contact. The left hip shows minimal narrowing  by imaging studies. This condition presents safety issues increasing the risk of falls. There is no current active infection.  Patient Active Problem List   Diagnosis Date Noted   Lumbar stenosis 01/25/2022   TIA (transient ischemic attack) 01/18/2022   Allergic contact dermatitis due to drugs in contact with skin 10/09/2021   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/04/2021   SIADH (syndrome of inappropriate ADH production) (HCC) 01/10/2021   Hyponatremia 01/05/2021   Hyperlipidemia with target LDL less than 100 12/17/2020   Stroke (HCC) 12/09/2020   Cerebral infarction (HCC) 12/09/2020   Psychophysiological insomnia 11/21/2020   GAD (generalized anxiety disorder) 11/21/2020   BPH (benign prostatic hyperplasia)    Essential hypertension 11/08/2019   Gastroesophageal reflux disease without esophagitis 11/08/2019   Herpes simplex 11/08/2019   Primary erectile dysfunction 11/08/2019   Senile purpura (HCC) 05/13/2019   Chronic allergic rhinitis due to pollen  03/24/2017   LPRD (laryngopharyngeal reflux disease) 03/24/2017   Bladder outflow obstruction 03/04/2016    Past Medical History:  Diagnosis Date   Arthritis    generalized   BPH (benign prostatic hyperplasia)    Cancer of skin, squamous cell    frozen    Cataract    bilateral sx   GERD (gastroesophageal reflux disease)    on meds   Hypertension    on meds   Shingles    Stroke [facility])     Past Surgical History:  Procedure Laterality Date   CARPAL TUNNEL RELEASE Left 09/09/2022   Procedure: LEFT CARPAL TUNNEL RELEASE;  Surgeon: Betha Loa, MD;  Location: [facility];  Service: Orthopedics;  Laterality: Left;  Bier block   CATARACT EXTRACTION, BILATERAL     COLONOSCOPY  06/27/2020   COLONOSCOPY  2018   gum graft     INGUINAL HERNIA REPAIR     as infant   LUMBAR LAMINECTOMY  06/2015   POLYPECTOMY  2018   TA x 3   REVERSE TOTAL SHOULDER ARTHROPLASTY Bilateral    TONSILLECTOMY AND ADENOIDECTOMY     age 26    TOTAL KNEE ARTHROPLASTY Bilateral    TOTAL KNEE REVISION Right 12/25/2017   TRANSURETHRAL RESECTION OF PROSTATE     x 2   UPPER GASTROINTESTINAL ENDOSCOPY      Prior to Admission medications   Medication Sig Start Date End Date Taking? Authorizing Provider  amLODipine (NORVASC) 10 MG tablet TAKE 1 TABLET BY MOUTH AT  BEDTIME 06/29/22  Yes Etta Grandchild, MD  ascorbic acid (VITAMIN C) 1000 MG tablet Take 1,000 mg by mouth at bedtime.  Yes [provider]  atorvastatin (LIPITOR) 10 MG tablet TAKE 1 TABLET(10 MG) BY MOUTH DAILY 08/04/22  Yes Etta Grandchild, MD  carvedilol (COREG) 6.25 MG tablet Take 1 tablet (6.25 mg total) by mouth 2 (two) times daily. 12/31/22  Yes Lewayne Bunting, MD  clopidogrel (PLAVIX) 75 MG tablet TAKE 1 TABLET(75 MG) BY MOUTH DAILY 11/11/22  Yes Etta Grandchild, MD  DULoxetine (CYMBALTA) 30 MG capsule Take 1 capsule (30 mg total) by mouth daily. 11/22/22  Yes Everlena Cooper, Adam R, DO  hydrochlorothiazide (MICROZIDE) 12.5 MG capsule  Take 1 capsule (12.5 mg total) by mouth daily. 02/25/22  Yes Conte, Tessa N, PA-C  loratadine (CLARITIN) 10 MG tablet Take 10 mg by mouth daily.   Yes [provider]  losartan (COZAAR) 100 MG tablet TAKE 1 TABLET BY MOUTH DAILY 12/09/22  Yes Etta Grandchild, MD  olopatadine (PATANOL) 0.1 % ophthalmic solution Place 1 drop into both eyes at bedtime.   Yes [provider]  PREVIDENT 5000 PLUS 1.1 % CREA dental cream Place 1 Application onto teeth in the morning and at bedtime. 03/31/21  Yes [provider]  psyllium (METAMUCIL) 58.6 % powder Take 1 packet by mouth at bedtime.   Yes [provider]  RABEprazole (ACIPHEX) 20 MG tablet Take 1 tablet (20 mg total) by mouth daily. 10/23/22  Yes Etta Grandchild, MD  tamsulosin (FLOMAX) 0.4 MG CAPS capsule Take 0.4 mg by mouth at bedtime. 09/17/19  Yes [provider]  traZODone (DESYREL) 50 MG tablet Take 25 mg by mouth at bedtime. 05/08/22  Yes [provider]  fluticasone (FLONASE) 50 MCG/ACT nasal spray Place 2 sprays into both nostrils daily. Patient not taking: Reported on 01/24/2023 02/07/22   Etta Grandchild, MD  HYDROcodone-acetaminophen (NORCO/VICODIN) 5-325 MG tablet 1 tab PO q6 hours prn pain Patient not taking: Reported on 01/24/2023 09/09/22   Betha Loa, MD    Allergies  Allergen Reactions   Percocet [Oxycodone-Acetaminophen] Itching    Social History   Socioeconomic History   Marital status: Married    Spouse name: Not on file   Number of children: Not on file   Years of education: Not on file   Highest education level: Not on file  Occupational History   Not on file  Tobacco Use   Smoking status: Never   Smokeless tobacco: Never  Vaping Use   Vaping status: Never Used  Substance and Sexual Activity   Alcohol use: Yes    Alcohol/week: 2.0 standard drinks of alcohol    Types: 2 Standard drinks or equivalent per week    Comment: wine 1-2 x a week and not every week    Drug  use: Never   Sexual activity: Not on file  Other Topics Concern   Not on file  Social History Narrative   Left handed   Social Determinants of Health   Financial Resource Strain: Low Risk  (03/04/2022)   Overall Financial Resource Strain (CARDIA)    Difficulty of Paying Living Expenses: Not hard at all  Food Insecurity: No Food Insecurity (03/04/2022)   Hunger Vital Sign    Worried About Running Out of Food in the Last Year: Never true    Ran Out of Food in the Last Year: Never true  Transportation Needs: No Transportation Needs (03/04/2022)   PRAPARE - Administrator, Civil Service (Medical): No    Lack of Transportation (Non-Medical): No  Physical Activity: Sufficiently Active (03/04/2022)  Exercise Vital Sign    Days of Exercise per Week: 7 days    Minutes of Exercise per Session: 30 min  Stress: No Stress Concern Present (03/04/2022)   [employer] of Occupational Health - Occupational Stress Questionnaire    Feeling of Stress : Not at all  Social Connections: Socially Integrated (03/04/2022)   Social Connection and Isolation Panel [NHANES]    Frequency of Communication with Friends and Family: More than three times a week    Frequency of Social Gatherings with Friends and Family: More than three times a week    Attends Religious Services: More than 4 times per year    Active Member of [employer] or Organizations: Yes    Attends Engineer, structural: More than 4 times per year    Marital Status: Married  Catering manager Violence: Not At Risk (03/04/2022)   Humiliation, Afraid, Rape, and Kick questionnaire    Fear of Current or Ex-Partner: No    Emotionally Abused: No    Physically Abused: No    Sexually Abused: No    Tobacco Use: Low Risk  (12/31/2022)   Patient History    Smoking Tobacco Use: Never    Smokeless Tobacco Use: Never    Passive Exposure: Not on file   Social History   Substance and Sexual Activity  Alcohol Use Yes   Alcohol/week: 2.0  standard drinks of alcohol   Types: 2 Standard drinks or equivalent per week   Comment: wine 1-2 x a week and not every week     Family History  Problem Relation Age of Onset   Colon polyps Mother 9   Stroke Maternal Grandfather    Colon cancer Neg Hx    Esophageal cancer Neg Hx    Rectal cancer Neg Hx    Stomach cancer Neg Hx     Review of Systems  Constitutional:  Negative for chills and fever.  HENT:  Negative for congestion, sore throat and tinnitus.   Eyes:  Negative for double vision, photophobia and pain.  Respiratory:  Negative for cough, shortness of breath and wheezing.   Cardiovascular:  Negative for chest pain, palpitations and orthopnea.  Gastrointestinal:  Negative for heartburn, nausea and vomiting.  Genitourinary:  Negative for dysuria, frequency and urgency.  Musculoskeletal:  Positive for joint pain.  Neurological:  Negative for dizziness, weakness and headaches.     Objective:  Physical Exam: Well nourished and well developed.  General: Alert and oriented x3, cooperative and pleasant, no acute distress.  Head: normocephalic, atraumatic, neck supple.  Eyes: EOMI.  Musculoskeletal:  Right hip Flexes to 100 with minimal internal rotation, 20 degrees of external rotation, 20 degrees of abduction. Antalgic gait pattern on the right.  Calves soft and nontender. Motor function intact in LE. Strength 5/5 LE bilaterally. Neuro: Distal pulses 2+. Sensation to light touch intact in LE.  Imaging Review Plain radiographs demonstrate severe degenerative joint disease of the right hip. The bone quality appears to be adequate for age and reported activity level.  Assessment/Plan:  End stage arthritis, right hip  The patient history, physical examination, clinical judgement of the provider and imaging studies are consistent with end stage degenerative joint disease of the right hip and total hip arthroplasty is deemed medically necessary. The treatment options  including medical management, injection therapy, arthroscopy and arthroplasty were discussed at length. The risks and benefits of total hip arthroplasty were presented and reviewed. The risks due to aseptic loosening, infection, stiffness, dislocation/subluxation, thromboembolic complications  and other imponderables were discussed. The patient acknowledged the explanation, agreed to proceed with the plan and consent was signed. Patient is being admitted for inpatient treatment for surgery, pain control, PT, OT, prophylactic antibiotics, VTE prophylaxis, progressive ambulation and ADLs and discharge planning.The patient is planning to be discharged  home .   Patient's anticipated LOS is less than 2 midnights, meeting these requirements: - Lives within 1 hour of care - Has a competent adult at home to recover with post-op recover - NO history of  - Chronic pain requiring opioids  - Diabetes  - Coronary Artery Disease  - Heart failure  - Heart attack  - DVT/VTE  - Cardiac arrhythmia  - Respiratory Failure/COPD  - Renal failure  - Anemia  - Advanced Liver disease  Therapy Plans: HEP Disposition: Home with wife and daughter Planned DVT Prophylaxis: Plavix + ASA (325 mg QD) DME Needed: None PCP: Sanda Linger, MD  Cardiologist: Olga Millers, MD (clearance received) TXA: Topical (two ischemic CVAs in 2022) Allergies: Morphine (itching) Anesthesia Concerns: None BMI: 29.6 Last HgbA1c: Not diabetic Pain Regimen: Hydrocodone Pharmacy: [facility])  Other: - Contacting Dr. Yetta Barre' office about moving up clearance appointment. It is currently scheduled for October 10th, daughter is going by office today to reschedule - Only takes Plavix currently. Will add in 81 mg ASA QD during 5 day window without Plavix (hx of two strokes in 2022).  - Patient was instructed on what medications to stop prior to surgery. - Follow-up visit in 2 weeks with Dr. Lequita Halt - Begin physical therapy  following surgery - Pre-operative lab work as pre-surgical testing - Prescriptions will be provided in hospital at time of discharge  Arther Abbott, PA-C Orthopedic Surgery [facility] [place]

## 2023-02-02 NOTE — Patient Instructions (Signed)
SURGICAL WAITING ROOM VISITATION Patients having surgery or a procedure may have no more than 2 support people in the waiting area - these visitors may rotate in the visitor waiting room.   Due to an increase in RSV and influenza rates and associated hospitalizations, children ages 37 and under may not visit patients in [facility] hospitals. If the patient needs to stay at the hospital during part of their recovery, the visitor guidelines for inpatient rooms apply.  PRE-OP VISITATION  Pre-op nurse will coordinate an appropriate time for 1 support person to accompany the patient in pre-op.  This support person may not rotate.  This visitor will be contacted when the time is appropriate for the visitor to come back in the pre-op area.  Please refer to the [facility] website for the visitor guidelines for Inpatients (after your surgery is over and you are in a regular room).  You are not required to quarantine at this time prior to your surgery. However, you must do this: Hand Hygiene often Do NOT share personal items Notify your provider if you are in close contact with someone who has COVID or you develop fever 100.4 or greater, new onset of sneezing, cough, sore throat, shortness of breath or body aches.  If you test positive for Covid or have been in contact with anyone that has tested positive in the last 10 days please notify you surgeon.    Your procedure is scheduled on:   [facility]    February 12, 2023  Report to [facility] Main Entrance: Le[facility]sen entrance where the [employer] is available.   Report to admitting at: 07:45    AM  Call this number if you have any questions or problems the morning of surgery [phone]  Do not eat food after Midnight the night prior to your surgery/procedure.  After Midnight you may have the following liquids until   07:15 AM DAY OF SURGERY  Clear Liquid Diet Water Black Coffee (sugar ok, NO MILK/CREAM OR CREAMERS)  Tea (sugar ok,  NO MILK/CREAM OR CREAMERS) regular and decaf                             Plain Jell-O  with no fruit (NO RED)                                           Fruit ices (not with fruit pulp, NO RED)                                     Popsicles (NO RED)                                                                  Juice: NO CITRUS JUICES: only apple, WHITE grape, WHITE cranberry Sports drinks like Gatorade or Powerade (NO RED)                   The day of surgery:  Drink ONE (1) Pre-Surgery Clear Ensure at  07:15  AM  the morning of surgery. Drink in one sitting. Do not sip.  This drink was given to you during your hospital pre-op appointment visit. Nothing else to drink after completing the Pre-Surgery Clear Ensure : No candy, chewing gum or throat lozenges.    FOLLOW ANY ADDITIONAL PRE OP INSTRUCTIONS YOU RECEIVED FROM YOUR SURGEON'S OFFICE!!!   Oral Hygiene is also important to reduce your risk of infection.        Remember - BRUSH YOUR TEETH THE MORNING OF SURGERY WITH YOUR REGULAR TOOTHPASTE  Do NOT smoke after Midnight the night before surgery.  STOP TAKING all Vitamins, Herbs and supplements 1 week before your surgery.   Take ONLY these medicines the morning of surgery with A SIP OF WATER: Rabeprazole (Aciphex), carvedilol (Coreg), loratadine (Claritin), duloxetine (Cymbalta).   You may not have any metal on your body including jewelry, and body piercing  Do not wear lotions, powders, cologne, or deodorant  Men may shave face and neck.  Contacts, Hearing Aids, dentures or bridgework may not be worn into surgery. DENTURES WILL BE REMOVED PRIOR TO SURGERY PLEASE DO NOT APPLY "Poly grip" OR ADHESIVES!!!  You may bring a small overnight bag with you on the day of surgery, only pack items that are not valuable. [facility] IS NOT RESPONSIBLE   FOR VALUABLES THAT ARE LOST OR STOLEN.    Do not bring your home medications to the hospital. The Pharmacy will dispense medications listed  on your medication list to you during your admission in the Hospital.  Special Instructions: Bring a copy of your healthcare power of attorney and living will documents the day of surgery, if you wish to have them scanned into your [facility]- EPIC  Please read over the following fact sheets you were given: IF YOU HAVE QUESTIONS ABOUT YOUR PRE-OP INSTRUCTIONS, PLEASE CALL [phone].     Pre-operative 5 CHG Bath Instructions   You can play a key role in reducing the risk of infection after surgery. Your skin needs to be as free of germs as possible. You can reduce the number of germs on your skin by washing with CHG (chlorhexidine gluconate) soap before surgery. CHG is an antiseptic soap that kills germs and continues to kill germs even after washing.   DO NOT use if you have an allergy to chlorhexidine/CHG or antibacterial soaps. If your skin becomes reddened or irritated, stop using the CHG and notify one of our RNs at [phone]  Please shower with the CHG soap starting 4 days before surgery using the following schedule: START SHOWERS ON     SATURDAY  February 08, 2023  Please keep in mind the following:  DO NOT shave, including legs and underarms, starting the day of your first shower.   You may shave your face at any point before/day of surgery.   Place clean sheets on your bed the day you start using CHG soap. Use a clean washcloth (not used since being washed) for each shower. DO NOT sleep with pets once you start using the CHG.   CHG Shower Instructions:  If you choose to wash your hair and private area, wash first with your normal shampoo/soap.  After you use shampoo/soap, rinse your hair and body thoroughly to remove shampoo/soap residue.  Turn the water OFF and apply about  3 tablespoons (45 ml) of CHG soap to a CLEAN washcloth.  Apply CHG soap ONLY FROM YOUR NECK DOWN TO YOUR TOES (washing for 3-5 minutes)  DO NOT use CHG soap on face, private areas, open wounds, or sores.  Pay special attention to the area where your surgery is being performed.  If you are having back surgery, having someone wash your back for you may be helpful.  Wait 2 minutes after CHG soap is applied, then you may rinse off the CHG soap.  Pat dry with a clean towel  Put on clean clothes/pajamas   If you choose to wear lotion, please use ONLY the CHG-compatible lotions on the back of this paper.     Additional instructions for the day of surgery: DO NOT APPLY any lotions, deodorants, cologne, or perfumes.   Put on clean/comfortable clothes.  Brush your teeth.  Ask your nurse before applying any prescription medications to the skin.      CHG Compatible Lotions   Aveeno Moisturizing lotion  Cetaphil Moisturizing Cream  Cetaphil Moisturizing Lotion  Clairol Herbal Essence Moisturizing Lotion, Dry Skin  Clairol Herbal Essence Moisturizing Lotion, Extra Dry Skin  Clairol Herbal Essence Moisturizing Lotion, Normal Skin  Curel Age Defying Therapeutic Moisturizing Lotion with Alpha Hydroxy  Curel Extreme Care Body Lotion  Curel Soothing Hands Moisturizing Hand Lotion  Curel Therapeutic Moisturizing Cream, Fragrance-Free  Curel Therapeutic Moisturizing Lotion, Fragrance-Free  Curel Therapeutic Moisturizing Lotion, Original Formula  Eucerin Daily Replenishing Lotion  Eucerin Dry Skin Therapy Plus Alpha Hydroxy Crme  Eucerin Dry Skin Therapy Plus Alpha Hydroxy Lotion  Eucerin Original Crme  Eucerin Original Lotion  Eucerin Plus Crme Eucerin Plus Lotion  Eucerin TriLipid Replenishing Lotion  Keri Anti-Bacterial Hand Lotion  Keri Deep Conditioning Original Lotion Dry Skin Formula Softly Scented  Keri Deep Conditioning Original Lotion, Fragrance Free Sensitive Skin Formula  Keri  Lotion Fast Absorbing Fragrance Free Sensitive Skin Formula  Keri Lotion Fast Absorbing Softly Scented Dry Skin Formula  Keri Original Lotion  Keri Skin Renewal Lotion Keri Silky Smooth Lotion  Keri Silky Smooth Sensitive Skin Lotion  Nivea Body Creamy Conditioning Oil  Nivea Body Extra Enriched Lotion  Nivea Body Original Lotion  Nivea Body Sheer Moisturizing Lotion Nivea Crme  Nivea Skin Firming Lotion  NutraDerm 30 Skin Lotion  NutraDerm Skin Lotion  NutraDerm Therapeutic Skin Cream  NutraDerm Therapeutic Skin Lotion  ProShield Protective Hand Cream  Provon moisturizing lotion   FAILURE TO FOLLOW THESE INSTRUCTIONS MAY RESULT IN THE CANCELLATION OF YOUR SURGERY  PATIENT SIGNATURE_________________________________  NURSE SIGNATURE__________________________________  ________________________________________________________________________      Kenneth Montoya    An incentive spirometer is a tool that can help keep your lungs clear and active. This tool measures how well you are filling your lungs with each breath. Taking long  deep breaths may help reverse or decrease the chance of developing breathing (pulmonary) problems (especially infection) following: A long period of time when you are unable to move or be active. BEFORE THE PROCEDURE  If the spirometer includes an indicator to show your best effort, your nurse or respiratory therapist will set it to a desired goal. If possible, sit up straight or lean slightly forward. Try not to slouch. Hold the incentive spirometer in an upright position. INSTRUCTIONS FOR USE  Sit on the edge of your bed if possible, or sit up as far as you can in bed or on a chair. Hold the incentive spirometer in an upright position. Breathe out normally. Place the mouthpiece in your mouth and seal your lips tightly around it. Breathe in slowly and as deeply as possible, raising the piston or the ball toward the top of the column. Hold your  breath for 3-5 seconds or for as long as possible. Allow the piston or ball to fall to the bottom of the column. Remove the mouthpiece from your mouth and breathe out normally. Rest for a few seconds and repeat Steps 1 through 7 at least 10 times every 1-2 hours when you are awake. Take your time and take a few normal breaths between deep breaths. The spirometer may include an indicator to show your best effort. Use the indicator as a goal to work toward during each repetition. After each set of 10 deep breaths, practice coughing to be sure your lungs are clear. If you have an incision (the cut made at the time of surgery), support your incision when coughing by placing a pillow or rolled up towels firmly against it. Once you are able to get out of bed, walk around indoors and cough well. You may stop using the incentive spirometer when instructed by your caregiver.  RISKS AND COMPLICATIONS Take your time so you do not get dizzy or light-headed. If you are in pain, you may need to take or ask for pain medication before doing incentive spirometry. It is harder to take a deep breath if you are having pain. AFTER USE Rest and breathe slowly and easily. It can be helpful to keep track of a log of your progress. Your caregiver can provide you with a simple table to help with this. If you are using the spirometer at home, follow these instructions: SEEK MEDICAL CARE IF:  You are having difficultly using the spirometer. You have trouble using the spirometer as often as instructed. Your pain medication is not giving enough relief while using the spirometer. You develop fever of 100.5 F (38.1 C) or higher.                                                                                                    SEEK IMMEDIATE MEDICAL CARE IF:  You cough up bloody sputum that had not been present before. You develop fever of 102 F (38.9 C) or greater. You develop worsening pain at or near the incision  site. MAKE SURE YOU:  Understand these instructions. Will watch your condition. Will  get help right away if you are not doing well or get worse. Document Released: 09/23/2006 Document Revised: 08/05/2011 Document Reviewed: 11/24/2006 [facility] Patient Information 2014 [place], [place].     WHAT IS A BLOOD TRANSFUSION? Blood Transfusion Information  A transfusion is the replacement of blood or some of its parts. Blood is made up of multiple cells which provide different functions. Red blood cells carry oxygen and are used for blood loss replacement. White blood cells fight against infection. Platelets control bleeding. Plasma helps clot blood. Other blood products are available for specialized needs, such as hemophilia or other clotting disorders. BEFORE THE TRANSFUSION  Who gives blood for transfusions?  Healthy volunteers who are fully evaluated to make sure their blood is safe. This is blood bank blood. Transfusion therapy is the safest it has ever been in the practice of medicine. Before blood is taken from a donor, a complete history is taken to make sure that person has no history of diseases nor engages in risky social behavior (examples are intravenous drug use or sexual activity with multiple partners). The donor's travel history is screened to minimize risk of transmitting infections, such as malaria. The donated blood is tested for signs of infectious diseases, such as HIV and hepatitis. The blood is then tested to be sure it is compatible with you in order to minimize the chance of a transfusion reaction. If you or a relative donates blood, this is often done in anticipation of surgery and is not appropriate for emergency situations. It takes many days to process the donated blood. RISKS AND COMPLICATIONS Although transfusion therapy is very safe and saves many lives, the main dangers of transfusion include:  Getting an infectious disease. Developing a transfusion reaction. This is  an allergic reaction to something in the blood you were given. Every precaution is taken to prevent this. The decision to have a blood transfusion has been considered carefully by your caregiver before blood is given. Blood is not given unless the benefits outweigh the risks. AFTER THE TRANSFUSION Right after receiving a blood transfusion, you will usually feel much better and more energetic. This is especially true if your red blood cells have gotten low (anemic). The transfusion raises the level of the red blood cells which carry oxygen, and this usually causes an energy increase. The nurse administering the transfusion will monitor you carefully for complications. HOME CARE INSTRUCTIONS  No special instructions are needed after a transfusion. You may find your energy is better. Speak with your caregiver about any limitations on activity for underlying diseases you may have. SEEK MEDICAL CARE IF:  Your condition is not improving after your transfusion. You develop redness or irritation at the intravenous (IV) site. SEEK IMMEDIATE MEDICAL CARE IF:  Any of the following symptoms occur over the next 12 hours: Shaking chills. You have a temperature by mouth above 102 F (38.9 C), not controlled by medicine. Chest, back, or muscle pain. People around you feel you are not acting correctly or are confused. Shortness of breath or difficulty breathing. Dizziness and fainting. You get a rash or develop hives. You have a decrease in urine output. Your urine turns a dark color or changes to pink, red, or brown. Any of the following symptoms occur over the next 10 days: You have a temperature by mouth above 102 F (38.9 C), not controlled by medicine. Shortness of breath. Weakness after normal activity. The white part of the eye turns yellow (jaundice). You have a decrease in the  amount of urine or are urinating less often. Your urine turns a dark color or changes to pink, red, or brown. Document  Released: 05/10/2000 Document Revised: 08/05/2011 Document Reviewed: 12/28/2007 [facility] Patient Information 2014 [place], [place].  _______________________________________________________________________

## 2023-02-02 NOTE — Progress Notes (Signed)
COVID Vaccine received:  []  No [x]  Yes Date of any COVID positive Test in last 90 days:  PCP - Sanda Linger, MD  Cardiologist - Olga Millers, MD  cardiac clearance in 12-31-22 Epic note Neurology-  Shon Millet, DO    Chest x-ray - 12-10-2020  1v  Epic EKG -  12-31-2022  Epic Stress Test -  ECHO - 01-19-2022  Epic Cardiac Cath -   PCR screen: []  Ordered & Completed           []   No Order but Needs PROFEND           []   N/A for this surgery  Surgery Plan:  []  Ambulatory   [x]  Outpatient in bed  []  Admit  Anesthesia:    []  General  []  Spinal  [x]   Choice []   MAC  Pacemaker / ICD device [x]  No []  Yes   Spinal Cord Stimulator:[x]  No []  Yes       History of Sleep Apnea? [x]  No []  Yes   CPAP used?- [x]  No []  Yes    Does the patient monitor blood sugar?  []  No []  Yes  [x]  N/A  Patient has: [x]  NO Hx DM   []  Pre-DM   []  DM1  []   DM2 Last A1c was: normal 5.4  on    01-18-22   Blood Thinner / Instructions:  PLAVIX   Aspirin Instructions:  none  ERAS Protocol Ordered: []  No  [x]  Yes PRE-SURGERY [x]  ENSURE  []  G2   []  No Drink Ordered Patient is to be NPO after: 07:15   Comments: Patient was given the 5 CHG shower / bath instructions for THA surgery along with 2 bottles of the CHG soap. Patient will start this on:  Saturday 9-14, 2024  All questions were asked and answered, Patient voiced understanding of this process.   Activity level: Patient is able / unable to climb a flight of stairs without difficulty; []  No CP  []  No SOB, but would have ___   Patient can / can not perform ADLs without assistance.   Anesthesia review: CAD, HTN, SIADH, GAD, GERD, Hx TIA (Blind spot in ? Eye)  Patient denies shortness of breath, fever, cough and chest pain at PAT appointment.  Patient verbalized understanding and agreement to the Pre-Surgical Instructions that were given to them at this PAT appointment. Patient was also educated of the need to review these PAT instructions again prior to his surgery.I  reviewed the appropriate phone numbers to call if they have any and questions or concerns.

## 2023-02-04 ENCOUNTER — Encounter (HOSPITAL_COMMUNITY): Payer: Self-pay

## 2023-02-04 ENCOUNTER — Encounter (HOSPITAL_COMMUNITY)
Admission: RE | Admit: 2023-02-04 | Discharge: 2023-02-04 | Disposition: A | Discharge status: Home / Self Care | Payer: Medicare Other | Source: Ambulatory Visit | Attending: Orthopedic Surgery

## 2023-02-04 ENCOUNTER — Other Ambulatory Visit: Payer: Self-pay

## 2023-02-04 ENCOUNTER — Encounter: Payer: Self-pay | Admitting: Internal Medicine

## 2023-02-04 ENCOUNTER — Ambulatory Visit: Payer: Medicare Other | Admitting: Internal Medicine

## 2023-02-04 VITALS — BP 129/77 | HR 64 | Temp 98.5°F | Resp 18 | Ht 67.0 in | Wt 200.0 lb

## 2023-02-04 VITALS — BP 146/82 | HR 61 | Temp 98.0°F | Resp 16 | Ht 68.0 in | Wt 201.0 lb

## 2023-02-04 DIAGNOSIS — Z01818 Encounter for other preprocedural examination: Secondary | ICD-10-CM

## 2023-02-04 DIAGNOSIS — I10 Essential (primary) hypertension: Secondary | ICD-10-CM

## 2023-02-04 DIAGNOSIS — E871 Hypo-osmolality and hyponatremia: Secondary | ICD-10-CM

## 2023-02-04 DIAGNOSIS — Z01812 Encounter for preprocedural laboratory examination: Secondary | ICD-10-CM | POA: Diagnosis not present

## 2023-02-04 DIAGNOSIS — N401 Enlarged prostate with lower urinary tract symptoms: Secondary | ICD-10-CM

## 2023-02-04 DIAGNOSIS — F411 Generalized anxiety disorder: Secondary | ICD-10-CM

## 2023-02-04 DIAGNOSIS — R3912 Poor urinary stream: Secondary | ICD-10-CM

## 2023-02-04 DIAGNOSIS — N529 Male erectile dysfunction, unspecified: Secondary | ICD-10-CM

## 2023-02-04 DIAGNOSIS — K219 Gastro-esophageal reflux disease without esophagitis: Secondary | ICD-10-CM | POA: Diagnosis not present

## 2023-02-04 HISTORY — DX: Anxiety disorder, unspecified: F41.9

## 2023-02-04 HISTORY — DX: Syndrome of inappropriate secretion of antidiuretic hormone: E22.2

## 2023-02-04 LAB — URINALYSIS, ROUTINE W REFLEX MICROSCOPIC
Bilirubin Urine: NEGATIVE
Hgb urine dipstick: NEGATIVE
Ketones, ur: NEGATIVE
Leukocytes,Ua: NEGATIVE
Nitrite: NEGATIVE
RBC / HPF: NONE SEEN
Specific Gravity, Urine: 1.015 (ref 1.000–1.030)
Total Protein, Urine: NEGATIVE
Urine Glucose: NEGATIVE
Urobilinogen, UA: 0.2 (ref 0.0–1.0)
pH: 7.5 (ref 5.0–8.0)

## 2023-02-04 LAB — BASIC METABOLIC PANEL WITH GFR
Anion gap: 11 (ref 5–15)
BUN: 17 mg/dL (ref 6–23)
BUN: 19 mg/dL (ref 8–23)
CO2: 29 meq/L (ref 19–32)
CO2: 27 mmol/L (ref 22–32)
Calcium: 9.3 mg/dL (ref 8.4–10.5)
Calcium: 9.5 mg/dL (ref 8.9–10.3)
Chloride: 94 meq/L — ABNORMAL LOW (ref 96–112)
Chloride: 91 mmol/L — ABNORMAL LOW (ref 98–111)
Creatinine, Ser: 0.97 mg/dL (ref 0.40–1.50)
Creatinine, Ser: 0.96 mg/dL (ref 0.61–1.24)
GFR, Estimated: >60 mL/min
GFR: 72.23 mL/min
Glucose, Bld: 93 mg/dL (ref 70–99)
Glucose, Bld: 100 mg/dL — ABNORMAL HIGH (ref 70–99)
Potassium: 4.1 meq/L (ref 3.5–5.1)
Potassium: 4.6 mmol/L (ref 3.5–5.1)
Sodium: 131 meq/L — ABNORMAL LOW (ref 135–145)
Sodium: 129 mmol/L — ABNORMAL LOW (ref 135–145)

## 2023-02-04 LAB — CBC WITH DIFFERENTIAL/PLATELET
Basophils Absolute: 0.1 K/uL (ref 0.0–0.1)
Basophils Relative: 0.8 % (ref 0.0–3.0)
Eosinophils Absolute: 0.4 K/uL (ref 0.0–0.7)
Eosinophils Relative: 5 % (ref 0.0–5.0)
HCT: 39.9 % (ref 39.0–52.0)
Hemoglobin: 13.2 g/dL (ref 13.0–17.0)
Lymphocytes Relative: 16.6 % (ref 12.0–46.0)
Lymphs Abs: 1.4 K/uL (ref 0.7–4.0)
MCHC: 33.1 g/dL (ref 30.0–36.0)
MCV: 93.8 fl (ref 78.0–100.0)
Monocytes Absolute: 0.7 K/uL (ref 0.1–1.0)
Monocytes Relative: 8.6 % (ref 3.0–12.0)
Neutro Abs: 5.8 K/uL (ref 1.4–7.7)
Neutrophils Relative %: 69 % (ref 43.0–77.0)
Platelets: 254 K/uL (ref 150.0–400.0)
RBC: 4.25 Mil/uL (ref 4.22–5.81)
RDW: 12.6 % (ref 11.5–15.5)
WBC: 8.4 K/uL (ref 4.0–10.5)

## 2023-02-04 LAB — CBC
HCT: 37.5 % — ABNORMAL LOW (ref 39.0–52.0)
Hemoglobin: 12.6 g/dL — ABNORMAL LOW (ref 13.0–17.0)
MCH: 31.3 pg (ref 26.0–34.0)
MCHC: 33.6 g/dL (ref 30.0–36.0)
MCV: 93.3 fL (ref 80.0–100.0)
Platelets: 222 K/uL (ref 150–400)
RBC: 4.02 MIL/uL — ABNORMAL LOW (ref 4.22–5.81)
RDW: 11.9 % (ref 11.5–15.5)
WBC: 8.9 K/uL (ref 4.0–10.5)
nRBC: 0 % (ref 0.0–0.2)

## 2023-02-04 LAB — SURGICAL PCR SCREEN
MRSA, PCR: NEGATIVE
Staphylococcus aureus: POSITIVE — AB

## 2023-02-04 LAB — TSH: TSH: 2.25 u[IU]/mL (ref 0.35–5.50)

## 2023-02-04 LAB — PSA: PSA: 2.75 ng/mL (ref 0.10–4.00)

## 2023-02-04 NOTE — Progress Notes (Unsigned)
Subjective:  Patient ID: [name], male    DOB: Nov 01, 1940  Age: 82 y.o. MRN: [id number]  CC: Hypertension and Gastroesophageal Reflux   HPI ANTRONE ZEH presents for f/up ----  Discussed the use of AI scribe software for clinical note transcription with the patient, who gave verbal consent to proceed.  History of Present Illness   He denies any recent symptoms of chest pain, shortness of breath, dizziness, or lightheadedness. His recent visit with his cardiologist, Dr. Jens Som, was reportedly uneventful, and he was approved for the upcoming surgery, pending the results of today's evaluation. The patient did not report any other health concerns.       Outpatient Medications Prior to Visit  Medication Sig Dispense Refill   amLODipine (NORVASC) 10 MG tablet TAKE 1 TABLET BY MOUTH AT  BEDTIME 90 tablet 3   ascorbic acid (VITAMIN C) 1000 MG tablet Take 1,000 mg by mouth at bedtime.     atorvastatin (LIPITOR) 10 MG tablet TAKE 1 TABLET(10 MG) BY MOUTH DAILY 90 tablet 1   carvedilol (COREG) 6.25 MG tablet Take 1 tablet (6.25 mg total) by mouth 2 (two) times daily. 180 tablet 3   clopidogrel (PLAVIX) 75 MG tablet TAKE 1 TABLET(75 MG) BY MOUTH DAILY 90 tablet 1   DULoxetine (CYMBALTA) 30 MG capsule Take 1 capsule (30 mg total) by mouth daily. 30 capsule 5   fluticasone (FLONASE) 50 MCG/ACT nasal spray Place 2 sprays into both nostrils daily. 48 g 1   hydrochlorothiazide (MICROZIDE) 12.5 MG capsule Take 1 capsule (12.5 mg total) by mouth daily. 90 capsule 3   HYDROcodone-acetaminophen (NORCO/VICODIN) 5-325 MG tablet 1 tab PO q6 hours prn pain 15 tablet 0   loratadine (CLARITIN) 10 MG tablet Take 10 mg by mouth daily.     losartan (COZAAR) 100 MG tablet TAKE 1 TABLET BY MOUTH DAILY 90 tablet 0   olopatadine (PATANOL) 0.1 % ophthalmic solution Place 1 drop into both eyes at bedtime.     PREVIDENT 5000 PLUS 1.1 % CREA dental cream Place 1 Application onto teeth in the morning and at  bedtime.     psyllium (METAMUCIL) 58.6 % powder Take 1 packet by mouth at bedtime.     RABEprazole (ACIPHEX) 20 MG tablet Take 1 tablet (20 mg total) by mouth daily. 90 tablet 1   tamsulosin (FLOMAX) 0.4 MG CAPS capsule Take 0.4 mg by mouth at bedtime.     traZODone (DESYREL) 50 MG tablet Take 25 mg by mouth at bedtime.     No facility-administered medications prior to visit.    ROS Review of Systems  Objective:  BP (!) 146/82 (BP Location: Left Arm, Patient Position: Sitting, Cuff Size: Large)   Pulse 61   Temp 98 F (36.7 C) (Oral)   Resp 16   Ht 5\' 8"  (1.727 m)   Wt 201 lb (91.2 kg)   SpO2 97%   BMI 30.56 kg/m   BP Readings from Last 3 Encounters:  02/04/23 (!) 146/82  12/31/22 (!) 150/98  10/28/22 (!) 120/56    Wt Readings from Last 3 Encounters:  02/04/23 201 lb (91.2 kg)  12/31/22 197 lb 6.4 oz (89.5 kg)  10/28/22 200 lb (90.7 kg)    Physical Exam  Lab Results  Component Value Date   WBC 8.4 02/04/2023   HGB 13.2 02/04/2023   HCT 39.9 02/04/2023   PLT 254.0 02/04/2023   GLUCOSE 93 02/04/2023   CHOL 163 12/31/2022   TRIG 75 12/31/2022  HDL 85 12/31/2022   LDLCALC 64 12/31/2022   ALT 15 12/31/2022   AST 23 12/31/2022   NA 131 (L) 02/04/2023   K 4.1 02/04/2023   CL 94 (L) 02/04/2023   CREATININE 0.97 02/04/2023   BUN 17 02/04/2023   CO2 29 02/04/2023   TSH 2.25 02/04/2023   PSA 2.75 02/04/2023   INR 1.0 01/18/2022   HGBA1C 5.4 01/18/2022    No results found.  Assessment & Plan:  GAD (generalized anxiety disorder) -     TSH; Future  Hyponatremia -     Basic metabolic panel; Future -     Urinalysis, Routine w reflex microscopic; Future -     Sodium, urine, random; Future  Benign prostatic hyperplasia with weak urinary stream -     PSA; Future -     Urinalysis, Routine w reflex microscopic; Future  LPRD (laryngopharyngeal reflux disease) -     CBC with Differential/Platelet; Future  Essential hypertension -     Urinalysis, Routine w  reflex microscopic; Future     Follow-up: Return in about 6 months (around 08/04/2023).  Sanda Linger, MD

## 2023-02-04 NOTE — Patient Instructions (Signed)
Hypertension, Adult High blood pressure (hypertension) is when the force of blood pumping through the arteries is too strong. The arteries are the blood vessels that carry blood from the heart throughout the body. Hypertension forces the heart to work harder to pump blood and may cause arteries to become narrow or stiff. Untreated or uncontrolled hypertension can lead to a heart attack, heart failure, a stroke, kidney disease, and other problems. A blood pressure reading consists of a higher number over a lower number. Ideally, your blood pressure should be below 120/80. The first ("top") number is called the systolic pressure. It is a measure of the pressure in your arteries as your heart beats. The second ("bottom") number is called the diastolic pressure. It is a measure of the pressure in your arteries as the heart relaxes. What are the causes? The exact cause of this condition is not known. There are some conditions that result in high blood pressure. What increases the risk? Certain factors may make you more likely to develop high blood pressure. Some of these risk factors are under your control, including: Smoking. Not getting enough exercise or physical activity. Being overweight. Having too much fat, sugar, calories, or salt (sodium) in your diet. Drinking too much alcohol. Other risk factors include: Having a personal history of heart disease, diabetes, high cholesterol, or kidney disease. Stress. Having a family history of high blood pressure and high cholesterol. Having obstructive sleep apnea. Age. The risk increases with age. What are the signs or symptoms? High blood pressure may not cause symptoms. Very high blood pressure (hypertensive crisis) may cause: Headache. Fast or irregular heartbeats (palpitations). Shortness of breath. Nosebleed. Nausea and vomiting. Vision changes. Severe chest pain, dizziness, and seizures. How is this diagnosed? This condition is diagnosed by  measuring your blood pressure while you are seated, with your arm resting on a flat surface, your legs uncrossed, and your feet flat on the floor. The cuff of the blood pressure monitor will be placed directly against the skin of your upper arm at the level of your heart. Blood pressure should be measured at least twice using the same arm. Certain conditions can cause a difference in blood pressure between your right and left arms. If you have a high blood pressure reading during one visit or you have normal blood pressure with other risk factors, you may be asked to: Return on a different day to have your blood pressure checked again. Monitor your blood pressure at home for 1 week or longer. If you are diagnosed with hypertension, you may have other blood or imaging tests to help your health care provider understand your overall risk for other conditions. How is this treated? This condition is treated by making healthy lifestyle changes, such as eating healthy foods, exercising more, and reducing your alcohol intake. You may be referred for counseling on a healthy diet and physical activity. Your health care provider may prescribe medicine if lifestyle changes are not enough to get your blood pressure under control and if: Your systolic blood pressure is above 130. Your diastolic blood pressure is above 80. Your personal target blood pressure may vary depending on your medical conditions, your age, and other factors. Follow these instructions at home: Eating and drinking  Eat a diet that is high in fiber and potassium, and low in sodium, added sugar, and fat. An example of this eating plan is called the DASH diet. DASH stands for Dietary Approaches to Stop Hypertension. To eat this way: Eat   plenty of fresh fruits and vegetables. Try to fill one half of your plate at each meal with fruits and vegetables. Eat whole grains, such as whole-wheat pasta, brown rice, or whole-grain bread. Fill about one  fourth of your plate with whole grains. Eat or drink low-fat dairy products, such as skim milk or low-fat yogurt. Avoid fatty cuts of meat, processed or cured meats, and poultry with skin. Fill about one fourth of your plate with lean proteins, such as fish, chicken without skin, beans, eggs, or tofu. Avoid pre-made and processed foods. These tend to be higher in sodium, added sugar, and fat. Reduce your daily sodium intake. Many people with hypertension should eat less than 1,500 mg of sodium a day. Do not drink alcohol if: Your health care provider tells you not to drink. You are pregnant, may be pregnant, or are planning to become pregnant. If you drink alcohol: Limit how much you have to: 0-1 drink a day for women. 0-2 drinks a day for men. Know how much alcohol is in your drink. In the [place] one drink equals one 12 oz bottle of beer (355 mL), one 5 oz glass of wine (148 mL), or one 1 oz glass of hard liquor (44 mL). Lifestyle  Work with your health care provider to maintain a healthy body weight or to lose weight. Ask what an ideal weight is for you. Get at least 30 minutes of exercise that causes your heart to beat faster (aerobic exercise) most days of the week. Activities may include walking, swimming, or biking. Include exercise to strengthen your muscles (resistance exercise), such as Pilates or lifting weights, as part of your weekly exercise routine. Try to do these types of exercises for 30 minutes at least 3 days a week. Do not use any products that contain nicotine or tobacco. These products include cigarettes, chewing tobacco, and vaping devices, such as e-cigarettes. If you need help quitting, ask your health care provider. Monitor your blood pressure at home as told by your health care provider. Keep all follow-up visits. This is important. Medicines Take over-the-counter and prescription medicines only as told by your health care provider. Follow directions carefully. Blood  pressure medicines must be taken as prescribed. Do not skip doses of blood pressure medicine. Doing this puts you at risk for problems and can make the medicine less effective. Ask your health care provider about side effects or reactions to medicines that you should watch for. Contact a health care provider if you: Think you are having a reaction to a medicine you are taking. Have headaches that keep coming back (recurring). Feel dizzy. Have swelling in your ankles. Have trouble with your vision. Get help right away if you: Develop a severe headache or confusion. Have unusual weakness or numbness. Feel faint. Have severe pain in your chest or abdomen. Vomit repeatedly. Have trouble breathing. These symptoms may be an emergency. Get help right away. Call 911. Do not wait to see if the symptoms will go away. Do not drive yourself to the hospital. Summary Hypertension is when the force of blood pumping through your arteries is too strong. If this condition is not controlled, it may put you at risk for serious complications. Your personal target blood pressure may vary depending on your medical conditions, your age, and other factors. For most people, a normal blood pressure is less than 120/80. Hypertension is treated with lifestyle changes, medicines, or a combination of both. Lifestyle changes include losing weight, eating a healthy,   low-sodium diet, exercising more, and limiting alcohol. This information is not intended to replace advice given to you by your health care provider. Make sure you discuss any questions you have with your health care provider. Document Revised: 03/20/2021 Document Reviewed: 03/20/2021 Elsevier Patient Education  2024 Elsevier Inc.  

## 2023-02-05 ENCOUNTER — Encounter (HOSPITAL_COMMUNITY): Payer: Self-pay

## 2023-02-05 LAB — SODIUM, URINE, RANDOM: Sodium, Ur: 107 mmol/L (ref 28–272)

## 2023-02-05 NOTE — Progress Notes (Signed)
Patient's PCR screen is positive for STAPH. Appropriate notes have been placed on the patient's chart. This note has been routed to Dr.  Lequita Halt and Eartha Inch, PA for review. The Patient's surgery is currently scheduled for: 02-12-2023               at [facility].  Rudean Haskell, BSN, CVRN-BC   Pre-Surgical Testing Nurse [facility]  [phone]

## 2023-02-05 NOTE — Anesthesia Preprocedure Evaluation (Addendum)
Anesthesia Evaluation  Patient identified by MRN, date of birth, ID band Patient awake    Reviewed: Allergy & Precautions, NPO status , Patient's Chart, lab work & pertinent test results, reviewed documented beta blocker date and time   Airway Mallampati: II  TM Distance: >3 FB Neck ROM: Limited    Dental no notable dental hx. (+) Dental Advisory Given   Pulmonary neg pulmonary ROS   Pulmonary exam normal breath sounds clear to auscultation       Cardiovascular hypertension, Pt. on medications + Peripheral Vascular Disease   Rhythm:Regular Rate:Normal   EKG 12/31/22   SR with PACs, rate 87 Bifasicular block   CV:   Echo 01/19/22: NL EF NL valves   Neuro/Psych  PSYCHIATRIC DISORDERS Anxiety     Glaucoma TIA Neuromuscular disease CVA    GI/Hepatic Neg liver ROS,GERD  Medicated,,  Endo/Other  Hyperlipidemia  Renal/GU negative Renal ROS   BPH    Musculoskeletal  (+) Arthritis , Osteoarthritis,  Left carpal tunnel syndrome   Abdominal  (+) + obese  Peds  Hematology Plavix therapy- last dose this am   Anesthesia Other Findings   Reproductive/Obstetrics                              Anesthesia Physical Anesthesia Plan  ASA: 3  Anesthesia Plan: General   Post-op Pain Management: Tylenol PO (pre-op)* and Celebrex PO (pre-op)*   Induction: Intravenous  PONV Risk Score and Plan: 2 and Ondansetron and Dexamethasone  Airway Management Planned: LMA and Oral ETT  Additional Equipment: None  Intra-op Plan:   Post-operative Plan: Extubation in OR  Informed Consent: I have reviewed the patients History and Physical, chart, labs and discussed the procedure including the risks, benefits and alternatives for the proposed anesthesia with the patient or authorized representative who has indicated his/her understanding and acceptance.     Dental advisory given  Plan Discussed with: CRNA  and Anesthesiologist  Anesthesia Plan Comments: (See PAT note from 9/10 by K Jefm Bryant PA-C    DISCUSSION: Kenneth Montoya is an 82 yo male who presents to PAT prior to right THA on 02/12/23 with Dr. Lequita Halt. PMH significant for HTN, coronary calcification, carotid artery disease, hx of TIA, chronic hyponatremia, GERD, BPH s/p TURP, anxiety   Follows with Cardiology for coronary calcifications and PAD. Last seen on 12/31/22 for pre-op clearance. Per Dr. Jens Som: "preoperative evaluation prior to hip replacement-he is scheduled for hip replacement in December. He is able to walk several blocks with no chest pain or dyspnea and is only limited by hip arthralgias. He may proceed without further cardiac evaluation"   Follows with PCP for chronic medical issues. Seen on 9/10 for pre-op evaluation. Sodium noted to be 129. He has chronic hyponatremia which is around his baseline. Advised f/u in 6 months.    Last dose of Plavix: 9/11 )         Anesthesia Quick Evaluation

## 2023-02-05 NOTE — Progress Notes (Signed)
Choose an anesthesia record to view details        DISCUSSION: Kenneth Montoya is an 82 yo male who presents to PAT prior to right THA on 02/12/23 with Dr. Lequita Halt. PMH significant for HTN, coronary calcification, carotid artery disease, hx of TIA, chronic hyponatremia, GERD, BPH s/p TURP, anxiety  Follows with Cardiology for coronary calcifications and PAD. Last seen on 12/31/22 for pre-op clearance. Per Dr. Jens Som: "preoperative evaluation prior to hip replacement-he is scheduled for hip replacement in December. He is able to walk several blocks with no chest pain or dyspnea and is only limited by hip arthralgias. He may proceed without further cardiac evaluation"  Follows with PCP for chronic medical issues. Seen on 9/10 for pre-op evaluation. Sodium noted to be 129. He has chronic hyponatremia which is around his baseline. Advised f/u in 6 months.   Last dose of Plavix: 9/11  VS: BP 129/77 Comment: right arm sitting  Pulse 64   Temp 36.9 C (Oral)   Resp 18   Ht 5\' 7"  (1.702 m)   Wt 90.7 kg   SpO2 98%   BMI 31.32 kg/m   PROVIDERS: Etta Grandchild, MD Cardiology: Olga Millers, MD  LABS: Labs reviewed: Acceptable for surgery. (all labs ordered are listed, but only abnormal results are displayed)  Labs Reviewed  SURGICAL PCR SCREEN - Abnormal; Notable for the following components:      Result Value   Staphylococcus aureus POSITIVE (*)    All other components within normal limits  BASIC METABOLIC PANEL - Abnormal; Notable for the following components:   Sodium 129 (*)    Chloride 91 (*)    Glucose, Bld 100 (*)    All other components within normal limits  CBC - Abnormal; Notable for the following components:   RBC 4.02 (*)    Hemoglobin 12.6 (*)    HCT 37.5 (*)    All other components within normal limits  TYPE AND SCREEN     IMAGES:  MRI lumbar 01/20/23   IMPRESSION: 1. Status post L2-S1 posterior decompression with improved patency of the spinal canal. 2.  Severe right L4 and L5 foraminal stenosis.   EKG 12/31/22  SR with PACs, rate 87 Bifasicular block  CV:  Echo 01/19/22:  IMPRESSIONS     1. Left ventricular ejection fraction, by estimation, is 60 to 65%. The  left ventricle has normal function. The left ventricle has no regional  wall motion abnormalities. There is mild left ventricular hypertrophy.  Left ventricular diastolic parameters  are indeterminate.   2. Right ventricular systolic function is normal. The right ventricular  size is normal. Tricuspid regurgitation signal is inadequate for assessing  PA pressure.   3. Left atrial size was mildly dilated.   4. The mitral valve was not well visualized. No evidence of mitral valve  regurgitation. No evidence of mitral stenosis.   5. The aortic valve was not well visualized. Aortic valve regurgitation  is not visualized. No aortic stenosis is present.   6. The inferior vena cava is normal in size with greater than 50%  respiratory variability, suggesting right atrial pressure of 3 mmHg.   Past Medical History:  Diagnosis Date   Anxiety    Arthritis    generalized   BPH (benign prostatic hyperplasia)    Cancer of skin, squamous cell    frozen    Cataract    bilateral sx   GERD (gastroesophageal reflux disease)    on meds   Hypertension  on meds   Shingles    SIADH (syndrome of inappropriate ADH production) (HCC)    Stroke [facility])     Past Surgical History:  Procedure Laterality Date   CARPAL TUNNEL RELEASE Left 09/09/2022   Procedure: LEFT CARPAL TUNNEL RELEASE;  Surgeon: Betha Loa, MD;  Location: [facility];  Service: Orthopedics;  Laterality: Left;  Bier block   CATARACT EXTRACTION, BILATERAL     COLONOSCOPY  06/27/2020   COLONOSCOPY  2018   EYE SURGERY     gum graft     INGUINAL HERNIA REPAIR     as infant   LUMBAR LAMINECTOMY  06/2015   POLYPECTOMY  2018   TA x 3   REVERSE TOTAL SHOULDER ARTHROPLASTY Bilateral    TONSILLECTOMY AND  ADENOIDECTOMY     age 62    TOTAL KNEE ARTHROPLASTY Bilateral    TOTAL KNEE REVISION Right 12/25/2017   TRANSURETHRAL RESECTION OF PROSTATE     x 2   UPPER GASTROINTESTINAL ENDOSCOPY      MEDICATIONS:  amLODipine (NORVASC) 10 MG tablet   ascorbic acid (VITAMIN C) 1000 MG tablet   atorvastatin (LIPITOR) 10 MG tablet   carvedilol (COREG) 6.25 MG tablet   clopidogrel (PLAVIX) 75 MG tablet   DULoxetine (CYMBALTA) 30 MG capsule   fluticasone (FLONASE) 50 MCG/ACT nasal spray   hydrochlorothiazide (MICROZIDE) 12.5 MG capsule   HYDROcodone-acetaminophen (NORCO/VICODIN) 5-325 MG tablet   loratadine (CLARITIN) 10 MG tablet   losartan (COZAAR) 100 MG tablet   olopatadine (PATANOL) 0.1 % ophthalmic solution   PREVIDENT 5000 PLUS 1.1 % CREA dental cream   psyllium (METAMUCIL) 58.6 % powder   RABEprazole (ACIPHEX) 20 MG tablet   tamsulosin (FLOMAX) 0.4 MG CAPS capsule   traZODone (DESYREL) 50 MG tablet   No current facility-administered medications for this encounter.   Marcille Blanco [facility] Surgical Short Stay/Anesthesiology [facility] Phone [phone] 02/05/2023 2:20 PM

## 2023-02-10 ENCOUNTER — Other Ambulatory Visit: Payer: Self-pay | Admitting: Internal Medicine

## 2023-02-10 DIAGNOSIS — I10 Essential (primary) hypertension: Secondary | ICD-10-CM

## 2023-02-12 ENCOUNTER — Encounter (HOSPITAL_COMMUNITY)
Admission: RE | Disposition: A | Discharge status: Home / Self Care | Payer: Self-pay | Source: Home / Self Care | Attending: Orthopedic Surgery

## 2023-02-12 ENCOUNTER — Ambulatory Visit (HOSPITAL_BASED_OUTPATIENT_CLINIC_OR_DEPARTMENT_OTHER): Payer: Self-pay | Admitting: Anesthesiology

## 2023-02-12 ENCOUNTER — Other Ambulatory Visit: Payer: Self-pay

## 2023-02-12 ENCOUNTER — Observation Stay (HOSPITAL_COMMUNITY): Payer: Medicare Other

## 2023-02-12 ENCOUNTER — Encounter (HOSPITAL_COMMUNITY): Payer: Self-pay | Admitting: Orthopedic Surgery

## 2023-02-12 ENCOUNTER — Observation Stay (HOSPITAL_COMMUNITY)
Admission: RE | Admit: 2023-02-12 | Discharge: 2023-02-13 | Disposition: A | Discharge status: Home / Self Care | Payer: Medicare Other | Attending: Orthopedic Surgery | Admitting: Orthopedic Surgery

## 2023-02-12 ENCOUNTER — Ambulatory Visit (HOSPITAL_COMMUNITY): Payer: Medicare Other | Admitting: Medical

## 2023-02-12 DIAGNOSIS — I10 Essential (primary) hypertension: Secondary | ICD-10-CM | POA: Diagnosis not present

## 2023-02-12 DIAGNOSIS — E785 Hyperlipidemia, unspecified: Secondary | ICD-10-CM

## 2023-02-12 DIAGNOSIS — M1611 Unilateral primary osteoarthritis, right hip: Secondary | ICD-10-CM

## 2023-02-12 DIAGNOSIS — Z79899 Other long term (current) drug therapy: Secondary | ICD-10-CM | POA: Diagnosis not present

## 2023-02-12 DIAGNOSIS — Z7902 Long term (current) use of antithrombotics/antiplatelets: Secondary | ICD-10-CM | POA: Insufficient documentation

## 2023-02-12 DIAGNOSIS — I639 Cerebral infarction, unspecified: Secondary | ICD-10-CM | POA: Diagnosis not present

## 2023-02-12 DIAGNOSIS — Z8673 Personal history of transient ischemic attack (TIA), and cerebral infarction without residual deficits: Secondary | ICD-10-CM | POA: Insufficient documentation

## 2023-02-12 DIAGNOSIS — Z96653 Presence of artificial knee joint, bilateral: Secondary | ICD-10-CM | POA: Insufficient documentation

## 2023-02-12 DIAGNOSIS — M169 Osteoarthritis of hip, unspecified: Principal | ICD-10-CM | POA: Diagnosis present

## 2023-02-12 DIAGNOSIS — Z85828 Personal history of other malignant neoplasm of skin: Secondary | ICD-10-CM | POA: Insufficient documentation

## 2023-02-12 HISTORY — PX: TOTAL HIP ARTHROPLASTY: SHX124

## 2023-02-12 LAB — TYPE AND SCREEN
ABO/RH(D): O POS
Antibody Screen: NEGATIVE

## 2023-02-12 SURGERY — ARTHROPLASTY, HIP, TOTAL, ANTERIOR APPROACH
Anesthesia: General | Site: Hip | Laterality: Right

## 2023-02-12 MED ORDER — PHENYLEPHRINE HCL-NACL 20-0.9 MG/250ML-% IV SOLN
INTRAVENOUS | Status: DC | PRN
Start: 2023-02-12 — End: 2023-02-12
  Administered 2023-02-12: 25 ug/min via INTRAVENOUS
  Administered 2023-02-12: 15 ug/min via INTRAVENOUS

## 2023-02-12 MED ORDER — FLEET ENEMA RE ENEM: 1.0000 | ENEMA | Freq: Once | RECTAL | Status: DC | PRN

## 2023-02-12 MED ORDER — ALBUMIN HUMAN 5 % IV SOLN: INTRAVENOUS | Status: DC | PRN

## 2023-02-12 MED ORDER — TAMSULOSIN HCL 0.4 MG PO CAPS
0.4000 mg | ORAL_CAPSULE | Freq: Every day | ORAL | Status: DC
  Administered 2023-02-12: 0.4 mg via ORAL
  Filled 2023-02-12: qty 1

## 2023-02-12 MED ORDER — FENTANYL CITRATE PF 50 MCG/ML IJ SOSY
PREFILLED_SYRINGE | INTRAMUSCULAR | Status: AC
  Administered 2023-02-12: 50 ug via INTRAVENOUS
  Filled 2023-02-12: qty 3

## 2023-02-12 MED ORDER — BUPIVACAINE-EPINEPHRINE (PF) 0.25% -1:200000 IJ SOLN
INTRAMUSCULAR | Status: DC | PRN
  Administered 2023-02-12: 30 mL

## 2023-02-12 MED ORDER — DIPHENHYDRAMINE HCL 12.5 MG/5ML PO ELIX: 12.5000 mg | ORAL_SOLUTION | ORAL | Status: DC | PRN

## 2023-02-12 MED ORDER — CARVEDILOL 6.25 MG PO TABS
6.2500 mg | ORAL_TABLET | Freq: Two times a day (BID) | ORAL | Status: DC
  Administered 2023-02-12 – 2023-02-13 (×3): 6.25 mg via ORAL
  Filled 2023-02-12 (×3): qty 1

## 2023-02-12 MED ORDER — HYDROCHLOROTHIAZIDE 12.5 MG PO TABS
12.5000 mg | ORAL_TABLET | Freq: Every day | ORAL | Status: DC
  Administered 2023-02-13: 12.5 mg via ORAL
  Filled 2023-02-12: qty 1

## 2023-02-12 MED ORDER — PROPOFOL 1000 MG/100ML IV EMUL
INTRAVENOUS | Status: AC
  Filled 2023-02-12: qty 100

## 2023-02-12 MED ORDER — CEFAZOLIN SODIUM-DEXTROSE 2-4 GM/100ML-% IV SOLN
2.0000 g | INTRAVENOUS | Status: AC
  Administered 2023-02-12: 2 g via INTRAVENOUS
  Filled 2023-02-12: qty 100

## 2023-02-12 MED ORDER — SODIUM CHLORIDE 0.9 % IV SOLN: INTRAVENOUS | Status: DC

## 2023-02-12 MED ORDER — SUGAMMADEX SODIUM 200 MG/2ML IV SOLN
INTRAVENOUS | Status: DC | PRN
  Administered 2023-02-12: 100 mg via INTRAVENOUS
  Administered 2023-02-12: 50 mg via INTRAVENOUS

## 2023-02-12 MED ORDER — LIDOCAINE 2% (20 MG/ML) 5 ML SYRINGE
INTRAMUSCULAR | Status: DC | PRN
  Administered 2023-02-12: 60 mg via INTRAVENOUS

## 2023-02-12 MED ORDER — WATER FOR IRRIGATION, STERILE IR SOLN
Status: DC | PRN
  Administered 2023-02-12: 2000 mL

## 2023-02-12 MED ORDER — ONDANSETRON HCL 4 MG/2ML IJ SOLN: 4.0000 mg | Freq: Once | INTRAMUSCULAR | Status: DC | PRN

## 2023-02-12 MED ORDER — LORATADINE 10 MG PO TABS
10.0000 mg | ORAL_TABLET | Freq: Every day | ORAL | Status: DC
  Administered 2023-02-13: 10 mg via ORAL
  Filled 2023-02-12: qty 1

## 2023-02-12 MED ORDER — FENTANYL CITRATE PF 50 MCG/ML IJ SOSY
25.0000 ug | PREFILLED_SYRINGE | INTRAMUSCULAR | Status: DC | PRN
  Administered 2023-02-12 (×2): 50 ug via INTRAVENOUS

## 2023-02-12 MED ORDER — PHENOL 1.4 % MT LIQD: 1.0000 | OROMUCOSAL | Status: DC | PRN

## 2023-02-12 MED ORDER — CARVEDILOL 12.5 MG PO TABS
6.2500 mg | ORAL_TABLET | Freq: Once | ORAL | Status: AC
  Administered 2023-02-12: 6.25 mg via ORAL
  Filled 2023-02-12: qty 1

## 2023-02-12 MED ORDER — POLYETHYLENE GLYCOL 3350 17 G PO PACK: 17.0000 g | PACK | Freq: Every day | ORAL | Status: DC | PRN

## 2023-02-12 MED ORDER — DEXAMETHASONE SODIUM PHOSPHATE 10 MG/ML IJ SOLN
8.0000 mg | Freq: Once | INTRAMUSCULAR | Status: AC
  Administered 2023-02-12: 8 mg via INTRAVENOUS

## 2023-02-12 MED ORDER — LACTATED RINGERS IV SOLN: INTRAVENOUS | Status: DC

## 2023-02-12 MED ORDER — OLOPATADINE HCL 0.1 % OP SOLN: 1.0000 [drp] | Freq: Every evening | OPHTHALMIC | Status: DC | PRN

## 2023-02-12 MED ORDER — PROPOFOL 10 MG/ML IV BOLUS
INTRAVENOUS | Status: DC | PRN
  Administered 2023-02-12: 50 mg via INTRAVENOUS
  Administered 2023-02-12: 100 mg via INTRAVENOUS

## 2023-02-12 MED ORDER — ONDANSETRON HCL 4 MG PO TABS: 4.0000 mg | ORAL_TABLET | Freq: Four times a day (QID) | ORAL | Status: DC | PRN

## 2023-02-12 MED ORDER — DOCUSATE SODIUM 100 MG PO CAPS
100.0000 mg | ORAL_CAPSULE | Freq: Two times a day (BID) | ORAL | Status: DC
  Administered 2023-02-12 – 2023-02-13 (×2): 100 mg via ORAL
  Filled 2023-02-12 (×2): qty 1

## 2023-02-12 MED ORDER — ACETAMINOPHEN 10 MG/ML IV SOLN
INTRAVENOUS | Status: AC
  Administered 2023-02-12: 1000 mg via INTRAVENOUS
  Filled 2023-02-12: qty 100

## 2023-02-12 MED ORDER — OXYCODONE HCL 5 MG PO TABS
ORAL_TABLET | ORAL | Status: AC
  Filled 2023-02-12: qty 1

## 2023-02-12 MED ORDER — CELECOXIB 200 MG PO CAPS
200.0000 mg | ORAL_CAPSULE | Freq: Once | ORAL | Status: AC
  Administered 2023-02-12: 200 mg via ORAL
  Filled 2023-02-12: qty 1

## 2023-02-12 MED ORDER — DEXAMETHASONE SODIUM PHOSPHATE 10 MG/ML IJ SOLN
10.0000 mg | Freq: Once | INTRAMUSCULAR | Status: AC
  Administered 2023-02-13: 10 mg via INTRAVENOUS
  Filled 2023-02-12: qty 1

## 2023-02-12 MED ORDER — HYDROCODONE-ACETAMINOPHEN 5-325 MG PO TABS
1.0000 | ORAL_TABLET | ORAL | Status: DC | PRN
  Administered 2023-02-12 – 2023-02-13 (×5): 1 via ORAL
  Filled 2023-02-12 (×2): qty 1
  Filled 2023-02-12: qty 2
  Filled 2023-02-12 (×2): qty 1

## 2023-02-12 MED ORDER — FENTANYL CITRATE (PF) 250 MCG/5ML IJ SOLN
INTRAMUSCULAR | Status: DC | PRN
  Administered 2023-02-12: 25 ug via INTRAVENOUS

## 2023-02-12 MED ORDER — KETOROLAC TROMETHAMINE 30 MG/ML IJ SOLN
INTRAMUSCULAR | Status: AC
  Filled 2023-02-12: qty 1

## 2023-02-12 MED ORDER — HYDROCODONE-ACETAMINOPHEN 7.5-325 MG PO TABS: 1.0000 | ORAL_TABLET | ORAL | Status: DC | PRN

## 2023-02-12 MED ORDER — ACETAMINOPHEN 10 MG/ML IV SOLN
1000.0000 mg | Freq: Once | INTRAVENOUS | Status: AC
  Filled 2023-02-12: qty 100

## 2023-02-12 MED ORDER — PHENYLEPHRINE HCL-NACL 20-0.9 MG/250ML-% IV SOLN
INTRAVENOUS | Status: AC
  Filled 2023-02-12: qty 250

## 2023-02-12 MED ORDER — ASPIRIN 325 MG PO TBEC
325.0000 mg | DELAYED_RELEASE_TABLET | Freq: Every day | ORAL | Status: DC
  Administered 2023-02-13: 325 mg via ORAL
  Filled 2023-02-12: qty 1

## 2023-02-12 MED ORDER — CLOPIDOGREL BISULFATE 75 MG PO TABS
75.0000 mg | ORAL_TABLET | Freq: Every day | ORAL | Status: DC
  Administered 2023-02-13: 75 mg via ORAL
  Filled 2023-02-12: qty 1

## 2023-02-12 MED ORDER — ORAL CARE MOUTH RINSE: 15.0000 mL | Freq: Once | OROMUCOSAL | Status: AC

## 2023-02-12 MED ORDER — ACETAMINOPHEN 160 MG/5ML PO SOLN: 325.0000 mg | ORAL | Status: DC | PRN

## 2023-02-12 MED ORDER — TRAZODONE HCL 50 MG PO TABS
25.0000 mg | ORAL_TABLET | Freq: Every day | ORAL | Status: DC
  Administered 2023-02-12: 25 mg via ORAL
  Filled 2023-02-12: qty 1

## 2023-02-12 MED ORDER — AMLODIPINE BESYLATE 10 MG PO TABS
10.0000 mg | ORAL_TABLET | Freq: Every day | ORAL | Status: DC
  Administered 2023-02-12: 10 mg via ORAL
  Filled 2023-02-12: qty 1

## 2023-02-12 MED ORDER — ONDANSETRON HCL 4 MG/2ML IJ SOLN
4.0000 mg | Freq: Four times a day (QID) | INTRAMUSCULAR | Status: DC | PRN
  Administered 2023-02-12: 4 mg via INTRAVENOUS
  Filled 2023-02-12: qty 2

## 2023-02-12 MED ORDER — OXYCODONE HCL 5 MG/5ML PO SOLN: 5.0000 mg | Freq: Once | ORAL | Status: AC | PRN

## 2023-02-12 MED ORDER — BISACODYL 10 MG RE SUPP: 10.0000 mg | Freq: Every day | RECTAL | Status: DC | PRN

## 2023-02-12 MED ORDER — MENTHOL 3 MG MT LOZG: 1.0000 | LOZENGE | OROMUCOSAL | Status: DC | PRN

## 2023-02-12 MED ORDER — ROCURONIUM BROMIDE 10 MG/ML (PF) SYRINGE
PREFILLED_SYRINGE | INTRAVENOUS | Status: DC | PRN
  Administered 2023-02-12: 50 mg via INTRAVENOUS

## 2023-02-12 MED ORDER — METOCLOPRAMIDE HCL 5 MG PO TABS: 5.0000 mg | ORAL_TABLET | Freq: Three times a day (TID) | ORAL | Status: DC | PRN

## 2023-02-12 MED ORDER — ONDANSETRON HCL 4 MG/2ML IJ SOLN
INTRAMUSCULAR | Status: DC | PRN
  Administered 2023-02-12: 4 mg via INTRAVENOUS

## 2023-02-12 MED ORDER — HYDROMORPHONE HCL 1 MG/ML IJ SOLN: 0.5000 mg | INTRAMUSCULAR | Status: DC | PRN

## 2023-02-12 MED ORDER — ACETAMINOPHEN 325 MG PO TABS: 325.0000 mg | ORAL_TABLET | ORAL | Status: DC | PRN

## 2023-02-12 MED ORDER — DULOXETINE HCL 30 MG PO CPEP
30.0000 mg | ORAL_CAPSULE | Freq: Every day | ORAL | Status: DC
  Administered 2023-02-12 – 2023-02-13 (×2): 30 mg via ORAL
  Filled 2023-02-12 (×2): qty 1

## 2023-02-12 MED ORDER — OXYCODONE HCL 5 MG PO TABS
5.0000 mg | ORAL_TABLET | Freq: Once | ORAL | Status: AC | PRN
  Administered 2023-02-12: 5 mg via ORAL

## 2023-02-12 MED ORDER — FENTANYL CITRATE (PF) 100 MCG/2ML IJ SOLN
INTRAMUSCULAR | Status: AC
  Filled 2023-02-12: qty 2

## 2023-02-12 MED ORDER — KETOROLAC TROMETHAMINE 30 MG/ML IJ SOLN
30.0000 mg | Freq: Once | INTRAMUSCULAR | Status: AC
  Administered 2023-02-12: 30 mg via INTRAVENOUS

## 2023-02-12 MED ORDER — BUPIVACAINE-EPINEPHRINE 0.25% -1:200000 IJ SOLN
INTRAMUSCULAR | Status: AC
  Filled 2023-02-12: qty 1

## 2023-02-12 MED ORDER — POVIDONE-IODINE 10 % EX SWAB: 2.0000 | Freq: Once | CUTANEOUS | Status: DC

## 2023-02-12 MED ORDER — METHOCARBAMOL 500 MG IVPB - SIMPLE MED
INTRAVENOUS | Status: AC
  Filled 2023-02-12: qty 55

## 2023-02-12 MED ORDER — 0.9 % SODIUM CHLORIDE (POUR BTL) OPTIME
TOPICAL | Status: DC | PRN
  Administered 2023-02-12: 1000 mL

## 2023-02-12 MED ORDER — METHOCARBAMOL 500 MG IVPB - SIMPLE MED
500.0000 mg | Freq: Four times a day (QID) | INTRAVENOUS | Status: DC | PRN
  Administered 2023-02-12: 500 mg via INTRAVENOUS
  Filled 2023-02-12: qty 55

## 2023-02-12 MED ORDER — DEXAMETHASONE SODIUM PHOSPHATE 10 MG/ML IJ SOLN
INTRAMUSCULAR | Status: AC
  Filled 2023-02-12: qty 1

## 2023-02-12 MED ORDER — FLUTICASONE PROPIONATE 50 MCG/ACT NA SUSP: 2.0000 | Freq: Every day | NASAL | Status: DC

## 2023-02-12 MED ORDER — MEPERIDINE HCL 50 MG/ML IJ SOLN: 6.2500 mg | INTRAMUSCULAR | Status: DC | PRN

## 2023-02-12 MED ORDER — TRANEXAMIC ACID 1000 MG/10ML IV SOLN
2000.0000 mg | Freq: Once | INTRAVENOUS | Status: DC
  Filled 2023-02-12: qty 20

## 2023-02-12 MED ORDER — LIDOCAINE HCL (PF) 2 % IJ SOLN
INTRAMUSCULAR | Status: AC
  Filled 2023-02-12: qty 5

## 2023-02-12 MED ORDER — PANTOPRAZOLE SODIUM 40 MG PO TBEC
40.0000 mg | DELAYED_RELEASE_TABLET | Freq: Every day | ORAL | Status: DC
  Administered 2023-02-12 – 2023-02-13 (×2): 40 mg via ORAL
  Filled 2023-02-12 (×2): qty 1

## 2023-02-12 MED ORDER — HYDROCHLOROTHIAZIDE 12.5 MG PO CAPS: 12.5000 mg | ORAL_CAPSULE | Freq: Every day | ORAL | Status: DC

## 2023-02-12 MED ORDER — ACETAMINOPHEN 325 MG PO TABS: 325.0000 mg | ORAL_TABLET | Freq: Four times a day (QID) | ORAL | Status: DC | PRN

## 2023-02-12 MED ORDER — CHLORHEXIDINE GLUCONATE 0.12 % MT SOLN
15.0000 mL | Freq: Once | OROMUCOSAL | Status: AC
  Administered 2023-02-12: 15 mL via OROMUCOSAL

## 2023-02-12 MED ORDER — ROCURONIUM BROMIDE 10 MG/ML (PF) SYRINGE
PREFILLED_SYRINGE | INTRAVENOUS | Status: AC
  Filled 2023-02-12: qty 10

## 2023-02-12 MED ORDER — ONDANSETRON HCL 4 MG/2ML IJ SOLN
INTRAMUSCULAR | Status: AC
  Filled 2023-02-12: qty 2

## 2023-02-12 MED ORDER — PROPOFOL 10 MG/ML IV BOLUS
INTRAVENOUS | Status: AC
  Filled 2023-02-12: qty 20

## 2023-02-12 MED ORDER — ATORVASTATIN CALCIUM 10 MG PO TABS
10.0000 mg | ORAL_TABLET | Freq: Every day | ORAL | Status: DC
  Administered 2023-02-13: 10 mg via ORAL
  Filled 2023-02-12: qty 1

## 2023-02-12 MED ORDER — LOSARTAN POTASSIUM 50 MG PO TABS
100.0000 mg | ORAL_TABLET | Freq: Every day | ORAL | Status: DC
  Administered 2023-02-13: 100 mg via ORAL
  Filled 2023-02-12: qty 2

## 2023-02-12 MED ORDER — METHOCARBAMOL 500 MG PO TABS
500.0000 mg | ORAL_TABLET | Freq: Four times a day (QID) | ORAL | Status: DC | PRN
  Administered 2023-02-13 (×2): 500 mg via ORAL
  Filled 2023-02-12 (×2): qty 1

## 2023-02-12 MED ORDER — CEFAZOLIN SODIUM-DEXTROSE 2-4 GM/100ML-% IV SOLN
2.0000 g | Freq: Four times a day (QID) | INTRAVENOUS | Status: AC
  Administered 2023-02-12 – 2023-02-13 (×2): 2 g via INTRAVENOUS
  Filled 2023-02-12 (×2): qty 100

## 2023-02-12 MED ORDER — PROPOFOL 500 MG/50ML IV EMUL
INTRAVENOUS | Status: DC | PRN
Start: 2023-02-12 — End: 2023-02-12
  Administered 2023-02-12 (×2): 100 ug/kg/min via INTRAVENOUS

## 2023-02-12 MED ORDER — PROPOFOL 500 MG/50ML IV EMUL
INTRAVENOUS | Status: AC
  Filled 2023-02-12: qty 50

## 2023-02-12 MED ORDER — METOCLOPRAMIDE HCL 5 MG/ML IJ SOLN: 5.0000 mg | Freq: Three times a day (TID) | INTRAMUSCULAR | Status: DC | PRN

## 2023-02-12 SURGICAL SUPPLY — 41 items
ADH SKN CLS APL DERMABOND .7 (GAUZE/BANDAGES/DRESSINGS) ×1
BAG COUNTER SPONGE SURGICOUNT (BAG) IMPLANT
BAG SPEC THK2 15X12 ZIP CLS (MISCELLANEOUS)
BAG SPNG CNTER NS LX DISP (BAG)
BAG ZIPLOCK 12X15 (MISCELLANEOUS) IMPLANT
BLADE SAG 18X100X1.27 (BLADE) ×2 IMPLANT
COVER PERINEAL POST (MISCELLANEOUS) ×2 IMPLANT
COVER SURGICAL LIGHT HANDLE (MISCELLANEOUS) ×2 IMPLANT
CUP ACETBLR 54 OD PINNACLE (Hips) IMPLANT
DERMABOND ADVANCED .7 DNX12 (GAUZE/BANDAGES/DRESSINGS) ×2 IMPLANT
DRAPE FOOT SWITCH (DRAPES) ×2 IMPLANT
DRAPE STERI IOBAN 125X83 (DRAPES) ×2 IMPLANT
DRAPE U-SHAPE 47X51 STRL (DRAPES) ×4 IMPLANT
DRSG AQUACEL AG ADV 3.5X10 (GAUZE/BANDAGES/DRESSINGS) ×2 IMPLANT
DURAPREP 26ML APPLICATOR (WOUND CARE) ×2 IMPLANT
ELECT REM PT RETURN 15FT ADLT (MISCELLANEOUS) ×2 IMPLANT
GLOVE BIO SURGEON STRL SZ 6.5 (GLOVE) IMPLANT
GLOVE BIO SURGEON STRL SZ8 (GLOVE) ×2 IMPLANT
GLOVE BIOGEL PI IND STRL 6.5 (GLOVE) IMPLANT
GLOVE BIOGEL PI IND STRL 7.0 (GLOVE) IMPLANT
GLOVE BIOGEL PI IND STRL 8 (GLOVE) ×2 IMPLANT
GOWN STRL REUS W/ TWL LRG LVL3 (GOWN DISPOSABLE) ×2 IMPLANT
GOWN STRL REUS W/TWL LRG LVL3 (GOWN DISPOSABLE) ×1
HEAD M SROM 36MM PLUS 1.5 (Hips) IMPLANT
HOLDER FOLEY CATH W/STRAP (MISCELLANEOUS) ×2 IMPLANT
KIT TURNOVER KIT A (KITS) IMPLANT
LINER MARATHON NEUT +4X54X36 (Hips) IMPLANT
MANIFOLD NEPTUNE II (INSTRUMENTS) ×2 IMPLANT
PACK ANTERIOR HIP CUSTOM (KITS) ×2 IMPLANT
PENCIL SMOKE EVACUATOR COATED (MISCELLANEOUS) ×2 IMPLANT
SPIKE FLUID TRANSFER (MISCELLANEOUS) ×2 IMPLANT
SROM M HEAD 36MM PLUS 1.5 (Hips) ×1 IMPLANT
STEM FEMORAL SZ 5MM STD ACTIS (Stem) IMPLANT
SUT ETHIBOND NAB CT1 #1 30IN (SUTURE) ×2 IMPLANT
SUT MNCRL AB 4-0 PS2 18 (SUTURE) ×2 IMPLANT
SUT STRATAFIX 0 PDS 27 VIOLET (SUTURE) ×1
SUT VIC AB 2-0 CT1 27 (SUTURE) ×2
SUT VIC AB 2-0 CT1 TAPERPNT 27 (SUTURE) ×4 IMPLANT
SUTURE STRATFX 0 PDS 27 VIOLET (SUTURE) ×2 IMPLANT
TRAY FOLEY MTR SLVR 16FR STAT (SET/KITS/TRAYS/PACK) ×2 IMPLANT
TUBE SUCTION HIGH CAP CLEAR NV (SUCTIONS) ×2 IMPLANT

## 2023-02-12 NOTE — Anesthesia Procedure Notes (Signed)
Procedure Name: Intubation Date/Time: 02/12/2023 10:32 AM  Performed by: Shary Decamp, CRNAPre-anesthesia Checklist: Patient identified, Patient being monitored, Timeout performed, Emergency Drugs available and Suction available Patient Re-evaluated:Patient Re-evaluated prior to induction Oxygen Delivery Method: Circle System Utilized Preoxygenation: Pre-oxygenation with 100% oxygen Induction Type: IV induction Ventilation: Mask ventilation without difficulty Laryngoscope Size: Miller and 2 Grade View: Grade II Tube type: Oral Tube size: 7.5 mm Number of attempts: 1 Airway Equipment and Method: Stylet Placement Confirmation: ETT inserted through vocal cords under direct vision, positive ETCO2 and breath sounds checked- equal and bilateral Secured at: 22 cm Tube secured with: Tape Dental Injury: Teeth and Oropharynx as per pre-operative assessment

## 2023-02-12 NOTE — Op Note (Signed)
OPERATIVE REPORT- TOTAL HIP ARTHROPLASTY   PREOPERATIVE DIAGNOSIS: Osteoarthritis of the Right hip.   POSTOPERATIVE DIAGNOSIS: Osteoarthritis of the Right  hip.   PROCEDURE: Right total hip arthroplasty, anterior approach.   SURGEON: Ollen Gross, MD   ASSISTANT: Arther Abbott, PA-C  ANESTHESIA:  General  ESTIMATED BLOOD LOSS:-150 mL    DRAINS: None  COMPLICATIONS: None   CONDITION: PACU - hemodynamically stable.   BRIEF CLINICAL NOTE: Kenneth Montoya is a 82 y.o. male who has advanced end-  stage arthritis of their Right  hip with progressively worsening pain and  dysfunction.The patient has failed nonoperative management and presents for  total hip arthroplasty.   PROCEDURE IN DETAIL: After successful administration of spinal  anesthetic, the traction boots for the [facility] bed were placed on both  feet and the patient was placed onto the [facility] bed, boots placed into the leg  holders. The Right hip was then isolated from the perineum with plastic  drapes and prepped and draped in the usual sterile fashion. ASIS and  greater trochanter were marked and a oblique incision was made, starting  at about 1 cm lateral and 2 cm distal to the ASIS and coursing towards  the anterior cortex of the femur. The skin was cut with a 10 blade  through subcutaneous tissue to the level of the fascia overlying the  tensor fascia lata muscle. The fascia was then incised in line with the  incision at the junction of the anterior third and posterior 2/3rd. The  muscle was teased off the fascia and then the interval between the TFL  and the rectus was developed. The Hohmann retractor was then placed at  the top of the femoral neck over the capsule. The vessels overlying the  capsule were cauterized and the fat on top of the capsule was removed.  A Hohmann retractor was then placed anterior underneath the rectus  femoris to give exposure to the entire anterior capsule. A T-shaped   capsulotomy was performed. The edges were tagged and the femoral head  was identified.       Osteophytes are removed off the superior acetabulum.  The femoral neck was then cut in situ with an oscillating saw. Traction  was then applied to the left lower extremity utilizing the [facility]  traction. The femoral head was then removed. Retractors were placed  around the acetabulum and then circumferential removal of the labrum was  performed. Osteophytes were also removed. Reaming starts at 47 mm to  medialize and  Increased in 2 mm increments to 53 mm. We reamed in  approximately 40 degrees of abduction, 20 degrees anteversion. A 54 mm  pinnacle acetabular shell was then impacted in anatomic position under  fluoroscopic guidance with excellent purchase. We did not need to place  any additional dome screws. A 36 mm neutral + 4 marathon liner was then  placed into the acetabular shell.       The femoral lift was then placed along the lateral aspect of the femur  just distal to the vastus ridge. The leg was  externally rotated and capsule  was stripped off the inferior aspect of the femoral neck down to the  level of the lesser trochanter, this was done with electrocautery. The femur was lifted after this was performed. The  leg was then placed in an extended and adducted position essentially delivering the femur. We also removed the capsule superiorly and the piriformis from the piriformis fossa to  gain excellent exposure of the  proximal femur. Rongeur was used to remove some cancellous bone to get  into the lateral portion of the proximal femur for placement of the  initial starter reamer. The starter broaches was placed  the starter broach  and was shown to go down the center of the canal. Broaching  with the Actis system was then performed starting at size 0  coursing  Up to size 5. A size 5 had excellent torsional and rotational  and axial stability. The trial standard offset neck was then  placed  with a 36 + 1.5 trial head. The hip was then reduced. We confirmed that  the stem was in the canal both on AP and lateral x-rays. It also has excellent sizing. The hip was reduced with outstanding stability through full extension and full external rotation.. AP pelvis was taken and the leg lengths were measured and found to be equal. Hip was then dislocated again and the femoral head and neck removed. The  femoral broach was removed. Size 5 Actis stem with a standard offset  neck was then impacted into the femur following native anteversion. Has  excellent purchase in the canal. Excellent torsional and rotational and  axial stability. It is confirmed to be in the canal on AP and lateral  fluoroscopic views. The 36 + 1.5 metal head was placed and the hip  reduced with outstanding stability. Again AP pelvis was taken and it  confirmed that the leg lengths were equal. The wound was then copiously  irrigated with saline solution and the capsule reattached and repaired  with Ethibond suture. 30 ml of .25% Bupivicaine was  injected into the capsule and into the edge of the tensor fascia lata as well as subcutaneous tissue. The fascia overlying the tensor fascia lata was then closed with a running #1 V-Loc. Subcu was closed with interrupted 2-0 Vicryl and subcuticular running 4-0 Monocryl. Incision was cleaned  and dried. Steri-Strips and a bulky sterile dressing applied. The patient was awakened and transported to  recovery in stable condition.        Please note that a surgical assistant was a medical necessity for this procedure to perform it in a safe and expeditious manner. Assistant was necessary to provide appropriate retraction of vital neurovascular structures and to prevent femoral fracture and allow for anatomic placement of the prosthesis.  Ollen Gross, M.D.

## 2023-02-12 NOTE — Plan of Care (Signed)

## 2023-02-12 NOTE — Care Plan (Signed)
Ortho Bundle Case Management Note  Patient Details  Name: Kenneth Montoya MRN: [id number] Date of Birth: 28-Sep-1940                  R THA on 02/12/23.  DCP: Home with wife Bonita Quin.  DME: No needs. Has RW.  PT: HEP   DME Arranged:  N/A DME Agency:      Additional Comments: Please contact me with any questions of if this plan should need to change.    Despina Pole, Case Manager  [facility]  [phone] 02/12/2023, 11:13 AM

## 2023-02-12 NOTE — Interval H&P Note (Signed)
History and Physical Interval Note:  02/12/2023 8:10 AM  Kenneth Montoya  has presented today for surgery, with the diagnosis of right hip osteoarthritis.  The various methods of treatment have been discussed with the patient and family. After consideration of risks, benefits and other options for treatment, the patient has consented to  Procedure(s): TOTAL HIP ARTHROPLASTY ANTERIOR APPROACH (Right) as a surgical intervention.  The patient's history has been reviewed, patient examined, no change in status, stable for surgery.  I have reviewed the patient's chart and labs.  Questions were answered to the patient's satisfaction.     [name]

## 2023-02-12 NOTE — Evaluation (Signed)
Physical Therapy Evaluation Patient Details Name: Kenneth Montoya MRN: [id number] DOB: [birth year] Today's Date: 02/12/2023  History of Present Illness  82 yo male presents to therapy s/p R THA, anterior approach on 02/12/2023 due to failure of conservative measures. Pt PMH includes but is not limited to: lumbar stenosis s/p; laminectomy, TIA, CVA, syndrome of inappropriate ADH prodition, hyponatremia, HLD, CGA, BPH, HTN, GERD, herpes, L TCR, B reverse TSA, and R TKA.  Clinical Impression     Kenneth Montoya is a 82 y.o. male POD 0 s/p R THA. Patient reports mod I with mobility at baseline. Patient is now limited by functional impairments (see PT problem list below) and requires min A for bed mobility and min A for transfers. Patient was able to ambulate 15 feet with RW and CGA level of assist with recliner close. Pt limited at time of eval by reports of pain escalating from 7/10 to 9/10 with pain medication provided before, during and after PT eval and nursing aware. Pt left seated in recliner, all needs in place, CP on R proximal LE, nursing and family present. Patient will benefit from continued skilled PT interventions to address impairments and progress towards PLOF. Acute PT will follow to progress mobility and stair training in preparation for safe discharge home with family support and HEP.       If plan is discharge home, recommend the following: A little help with walking and/or transfers;A little help with bathing/dressing/bathroom;Assistance with feeding;Assist for transportation;Help with stairs or ramp for entrance   Can travel by private vehicle        Equipment Recommendations None recommended by PT  Recommendations for Other Services       Functional Status Assessment Patient has had a recent decline in their functional status and demonstrates the ability to make significant improvements in function in a reasonable and predictable amount of time.     Precautions /  Restrictions Precautions Precautions: Fall Restrictions Weight Bearing Restrictions: Yes RLE Weight Bearing: Weight bearing as tolerated      Mobility  Bed Mobility Overal bed mobility: Needs Assistance Bed Mobility: Supine to Sit     Supine to sit: Min assist, HOB elevated, Used rails     General bed mobility comments: cues for encouragement and A for RLE to EOB    Transfers Overall transfer level: Needs assistance Equipment used: Rolling walker (2 wheels) Transfers: Sit to/from Stand Sit to Stand: Min assist, From elevated surface           General transfer comment: cues for safety and proper technique    Ambulation/Gait Ambulation/Gait assistance: Contact guard assist Gait Distance (Feet): 15 Feet Assistive device: Rolling walker (2 wheels) Gait Pattern/deviations: Step-to pattern, Antalgic, Trunk flexed Gait velocity: decreased     General Gait Details: pt indicated minimal increase in pain with standing and gait tasks initally and at the end reported 9/10 R LE and hip pain and became nauseated, nurse aware and managed pt with pharmacuticals. pt amb with short stide lenght, limited R LE stance time, B limited foot clearance and slow cadence with recliner close behind  Stairs            Wheelchair Mobility     Tilt Bed    Modified Rankin (Stroke Patients Only)       Balance Overall balance assessment: Needs assistance Sitting-balance support: Feet supported Sitting balance-Leahy Scale: Good     Standing balance support: Bilateral upper extremity supported, During functional activity, Reliant on assistive device  for balance Standing balance-Leahy Scale: Poor                               Pertinent Vitals/Pain Pain Assessment Pain Assessment: 0-10 Pain Score: 9  (pt indicated pain was 7/10 at rest and had not been any better since he came out of sx) Pain Location: R hip and LE Pain Descriptors / Indicators: Aching, Constant,  Cramping, Crying, Discomfort, Grimacing, Moaning, Operative site guarding Pain Intervention(s): Limited activity within patient's tolerance, Monitored during session, Premedicated before session, Repositioned, Patient requesting pain meds-RN notified, RN gave pain meds during session, Ice applied    Home Living Family/patient expects to be discharged to:: Private residence Living Arrangements: Spouse/significant other Available Help at Discharge: Family Type of Home: House Home Access: Stairs to enter Entrance Stairs-Rails: None Entrance Stairs-Number of Steps: 1   Home Layout: One level Home Equipment: Agricultural consultant (2 wheels);Cane - single point;Grab bars - tub/shower;Grab bars - toilet      Prior Function Prior Level of Function : Independent/Modified Independent             Mobility Comments: mod I at RW level in home and community, mod I with ADLs, self care tasks       Extremity/Trunk Assessment        Lower Extremity Assessment Lower Extremity Assessment: RLE deficits/detail RLE Deficits / Details: ankle DF/PF 5/5 RLE Sensation: WNL    Cervical / Trunk Assessment Cervical / Trunk Assessment: Back Surgery  Communication   Communication Communication: Hearing impairment  Cognition Arousal: Alert Behavior During Therapy: WFL for tasks assessed/performed Overall Cognitive Status: Within Functional Limits for tasks assessed                                          General Comments      Exercises     Assessment/Plan    PT Assessment Patient needs continued PT services  PT Problem List Decreased strength;Decreased range of motion;Decreased activity tolerance;Decreased balance;Decreased mobility;Decreased coordination;Pain       PT Treatment Interventions DME instruction;Gait training;Stair training;Functional mobility training;Therapeutic activities;Therapeutic exercise;Balance training;Neuromuscular re-education;Patient/family  education;Modalities    PT Goals (Current goals can be found in the Care Plan section)  Acute Rehab PT Goals Patient Stated Goal: to be able to walk normally again and loose weight PT Goal Formulation: With patient/family Time For Goal Achievement: 02/26/23 Potential to Achieve Goals: Good    Frequency 7X/week     Co-evaluation               AM-PAC PT "6 Clicks" Mobility  Outcome Measure Help needed turning from your back to your side while in a flat bed without using bedrails?: A Little Help needed moving from lying on your back to sitting on the side of a flat bed without using bedrails?: A Little Help needed moving to and from a bed to a chair (including a wheelchair)?: A Little Help needed standing up from a chair using your arms (e.g., wheelchair or bedside chair)?: A Little Help needed to walk in hospital room?: A Little Help needed climbing 3-5 steps with a railing? : A Lot 6 Click Score: 17    End of Session Equipment Utilized During Treatment: Gait belt Activity Tolerance: Patient limited by pain;Patient limited by fatigue Patient left: in chair;with call bell/phone within reach;with nursing/sitter in  room;with family/visitor present Nurse Communication: Mobility status;Patient requests pain meds PT Visit Diagnosis: Unsteadiness on feet (R26.81);Other abnormalities of gait and mobility (R26.89);Muscle weakness (generalized) (M62.81);Difficulty in walking, not elsewhere classified (R26.2);Pain Pain - Right/Left: Right Pain - part of body: Leg;Hip    Time: 4098-1191 PT Time Calculation (min) (ACUTE ONLY): 39 min   Charges:   PT Evaluation $PT Eval Low Complexity: 1 Low PT Treatments $Gait Training: 8-22 mins $Therapeutic Activity: 8-22 mins PT General Charges $$ ACUTE PT VISIT: 1 Visit         Johnny Bridge, PT Acute Rehab   Jacqualyn Posey 02/12/2023, 6:39 PM

## 2023-02-12 NOTE — Anesthesia Postprocedure Evaluation (Signed)
Anesthesia Post Note  Patient: NAPOLEAN SIDMAN  Procedure(s) Performed: RIGHT TOTAL HIP ARTHROPLASTY ANTERIOR APPROACH (Right: Hip)     Patient location during evaluation: PACU Anesthesia Type: General Level of consciousness: awake and alert Pain management: pain level controlled Vital Signs Assessment: post-procedure vital signs reviewed and stable Respiratory status: spontaneous breathing, nonlabored ventilation, respiratory function stable and patient connected to nasal cannula oxygen Cardiovascular status: blood pressure returned to baseline and stable Postop Assessment: no apparent nausea or vomiting Anesthetic complications: no   No notable events documented.  Last Vitals:  Vitals:   02/12/23 1330 02/12/23 1345  BP: (!) 167/83 (!) 158/94  Pulse: 79 79  Resp: 15 16  Temp:    SpO2: 100% 100%    Last Pain:  Vitals:   02/12/23 1330  TempSrc:   PainSc: 8                  Vernesha Talbot

## 2023-02-12 NOTE — Discharge Instructions (Signed)
Kenneth Gross, MD Total Joint Specialist [facility] [address] [place], [place] [address] [phone]  ANTERIOR APPROACH TOTAL HIP REPLACEMENT POSTOPERATIVE DIRECTIONS     Hip Rehabilitation, Guidelines Following Surgery  The results of a hip operation are greatly improved after range of motion and muscle strengthening exercises. Follow all safety measures which are given to protect your hip. If any of these exercises cause increased pain or swelling in your joint, decrease the amount until you are comfortable again. Then slowly increase the exercises. Call your caregiver if you have problems or questions.   BLOOD CLOT PREVENTION In addition to your Plavix, take a 325 mg Aspirin once a day for three weeks following surgery. Then take an 81 mg Aspirin once a day for three weeks. Then discontinue Aspirin. You may resume your vitamins/supplements upon discharge from the hospital.  HOME CARE INSTRUCTIONS  Remove items at home which could result in a fall. This includes throw rugs or furniture in walking pathways.  ICE to the affected hip as frequently as 20-30 minutes an hour and then as needed for pain and swelling. Continue to use ice on the hip for pain and swelling from surgery. You may notice swelling that will progress down to the foot and ankle. This is normal after surgery. Elevate the leg when you are not up walking on it.   Continue to use the breathing machine which will help keep your temperature down.  It is common for your temperature to cycle up and down following surgery, especially at night when you are not up moving around and exerting yourself.  The breathing machine keeps your lungs expanded and your temperature down.  DIET You may resume your previous home diet once your are discharged from the hospital.  DRESSING / WOUND CARE / SHOWERING You have an adhesive waterproof bandage over the incision. Leave this in place until your first  follow-up appointment. Once you remove this you will not need to place another bandage.  You may begin showering 3 days following surgery, but do not submerge the incision under water.  ACTIVITY For the first 3-5 days, it is important to rest and keep the operative leg elevated. You should, as a general rule, rest for 50 minutes and walk/stretch for 10 minutes per hour. After 5 days, you may slowly increase activity as tolerated.  Perform the exercises you were provided twice a day for about 15-20 minutes each session. Begin these 2 days following surgery. Walk with your walker as instructed. Use the walker until you are comfortable transitioning to a cane. Walk with the cane in the opposite hand of the operative leg. You may discontinue the cane once you are comfortable and walking steadily. Avoid periods of inactivity such as sitting longer than an hour when not asleep. This helps prevent blood clots.  Do not drive a car for 6 weeks or until released by your surgeon.  Do not drive while taking narcotics.  TED HOSE STOCKINGS Wear the elastic stockings on both legs for three weeks following surgery during the day. You may remove them at night while sleeping.  WEIGHT BEARING Weight bearing as tolerated with assist device (walker, cane, etc) as directed, use it as long as suggested by your surgeon or therapist, typically at least 4-6 weeks.  POSTOPERATIVE CONSTIPATION PROTOCOL Constipation - defined medically as fewer than three stools per week and severe constipation as less than one stool per week.  One of the most common issues patients  have following surgery is constipation.  Even if you have a regular bowel pattern at home, your normal regimen is likely to be disrupted due to multiple reasons following surgery.  Combination of anesthesia, postoperative narcotics, change in appetite and fluid intake all can affect your bowels.  In order to avoid complications following surgery, here are some  recommendations in order to help you during your recovery period.  Colace (docusate) - Pick up an over-the-counter form of Colace or another stool softener and take twice a day as long as you are requiring postoperative pain medications.  Take with a full glass of water daily.  If you experience loose stools or diarrhea, hold the colace until you stool forms back up.  If your symptoms do not get better within 1 week or if they get worse, check with your doctor. Dulcolax (bisacodyl) - Pick up over-the-counter and take as directed by the product packaging as needed to assist with the movement of your bowels.  Take with a full glass of water.  Use this product as needed if not relieved by Colace only.  MiraLax (polyethylene glycol) - Pick up over-the-counter to have on hand.  MiraLax is a solution that will increase the amount of water in your bowels to assist with bowel movements.  Take as directed and can mix with a glass of water, juice, soda, coffee, or tea.  Take if you go more than two days without a movement.Do not use MiraLax more than once per day. Call your doctor if you are still constipated or irregular after using this medication for 7 days in a row.  If you continue to have problems with postoperative constipation, please contact the office for further assistance and recommendations.  If you experience "the worst abdominal pain ever" or develop nausea or vomiting, please contact the office immediatly for further recommendations for treatment.  ITCHING  If you experience itching with your medications, try taking only a single pain pill, or even half a pain pill at a time.  You can also use Benadryl over the counter for itching or also to help with sleep.   MEDICATIONS See your medication summary on the "After Visit Summary" that the nursing staff will review with you prior to discharge.  You may have some home medications which will be placed on hold until you complete the course of blood  thinner medication.  It is important for you to complete the blood thinner medication as prescribed by your surgeon.  Continue your approved medications as instructed at time of discharge.  PRECAUTIONS If you experience chest pain or shortness of breath - call 911 immediately for transfer to the hospital emergency department.  If you develop a fever greater that 101 F, purulent drainage from wound, increased redness or drainage from wound, foul odor from the wound/dressing, or calf pain - CONTACT YOUR SURGEON.                                                   FOLLOW-UP APPOINTMENTS Make sure you keep all of your appointments after your operation with your surgeon and caregivers. You should call the office at the above phone number and make an appointment for approximately two weeks after the date of your surgery or on the date instructed by your surgeon outlined in the "After Visit Summary".  RANGE OF  MOTION AND STRENGTHENING EXERCISES  These exercises are designed to help you keep full movement of your hip joint. Follow your caregiver's or physical therapist's instructions. Perform all exercises about fifteen times, three times per day or as directed. Exercise both hips, even if you have had only one joint replacement. These exercises can be done on a training (exercise) mat, on the floor, on a table or on a bed. Use whatever works the best and is most comfortable for you. Use music or television while you are exercising so that the exercises are a pleasant break in your day. This will make your life better with the exercises acting as a break in routine you can look forward to.  Lying on your back, slowly slide your foot toward your buttocks, raising your knee up off the floor. Then slowly slide your foot back down until your leg is straight again.  Lying on your back spread your legs as far apart as you can without causing discomfort.  Lying on your side, raise your upper leg and foot straight up  from the floor as far as is comfortable. Slowly lower the leg and repeat.  Lying on your back, tighten up the muscle in the front of your thigh (quadriceps muscles). You can do this by keeping your leg straight and trying to raise your heel off the floor. This helps strengthen the largest muscle supporting your knee.  Lying on your back, tighten up the muscles of your buttocks both with the legs straight and with the knee bent at a comfortable angle while keeping your heel on the floor.   POST-OPERATIVE OPIOID TAPER INSTRUCTIONS: It is important to wean off of your opioid medication as soon as possible. If you do not need pain medication after your surgery it is ok to stop day one. Opioids include: Codeine, Hydrocodone(Norco, Vicodin), Oxycodone(Percocet, oxycontin) and hydromorphone amongst others.  Long term and even short term use of opiods can cause: Increased pain response Dependence Constipation Depression Respiratory depression And more.  Withdrawal symptoms can include Flu like symptoms Nausea, vomiting And more Techniques to manage these symptoms Hydrate well Eat regular healthy meals Stay active Use relaxation techniques(deep breathing, meditating, yoga) Do Not substitute Alcohol to help with tapering If you have been on opioids for less than two weeks and do not have pain than it is ok to stop all together.  Plan to wean off of opioids This plan should start within one week post op of your joint replacement. Maintain the same interval or time between taking each dose and first decrease the dose.  Cut the total daily intake of opioids by one tablet each day Next start to increase the time between doses. The last dose that should be eliminated is the evening dose.   IF YOU ARE TRANSFERRED TO A SKILLED REHAB FACILITY If the patient is transferred to a skilled rehab facility following release from the hospital, a list of the current medications will be sent to the facility  for the patient to continue.  When discharged from the skilled rehab facility, please have the facility set up the patient's Home Health Physical Therapy prior to being released. Also, the skilled facility will be responsible for providing the patient with their medications at time of release from the facility to include their pain medication, the muscle relaxants, and their blood thinner medication. If the patient is still at the rehab facility at time of the two week follow up appointment, the skilled rehab facility will  also need to assist the patient in arranging follow up appointment in our office and any transportation needs.  MAKE SURE YOU:  Understand these instructions.  Get help right away if you are not doing well or get worse.    DENTAL ANTIBIOTICS:  In most cases prophylactic antibiotics for Dental procdeures after total joint surgery are not necessary.  Exceptions are as follows:  1. History of prior total joint infection  2. Severely immunocompromised (Organ Transplant, cancer chemotherapy, Rheumatoid biologic meds such as Humera)  3. Poorly controlled diabetes (A1C &gt; 8.0, blood glucose over 200)  If you have one of these conditions, contact your surgeon for an antibiotic prescription, prior to your dental procedure.    Pick up stool softner and laxative for home use following surgery while on pain medications. Do not submerge incision under water. Please use good hand washing techniques while changing dressing each day. May shower starting three days after surgery. Please use a clean towel to pat the incision dry following showers. Continue to use ice for pain and swelling after surgery. Do not use any lotions or creams on the incision until instructed by your surgeon.

## 2023-02-12 NOTE — Transfer of Care (Signed)
Immediate Anesthesia Transfer of Care Note  Patient: Kenneth Montoya  Procedure(s) Performed: RIGHT TOTAL HIP ARTHROPLASTY ANTERIOR APPROACH (Right: Hip)  Patient Location: PACU  Anesthesia Type:General  Level of Consciousness: awake, alert , patient cooperative, and responds to stimulation  Airway & Oxygen Therapy: Patient Spontanous Breathing and Patient connected to face mask oxygen  Post-op Assessment: Report given to RN, Post -op Vital signs reviewed and stable, and Patient moving all extremities X 4  Post vital signs: Reviewed and stable  Last Vitals:  Vitals Value Taken Time  BP    Temp    Pulse    Resp    SpO2      Last Pain:  Vitals:   02/12/23 0828  TempSrc:   PainSc: 0-No pain         Complications: No notable events documented.

## 2023-02-12 NOTE — Plan of Care (Signed)
  Problem: Education: Goal: Knowledge of the prescribed therapeutic regimen will improve Outcome: Progressing   Problem: Activity: Goal: Ability to avoid complications of mobility impairment will improve Outcome: Progressing   Problem: Clinical Measurements: Goal: Postoperative complications will be avoided or minimized 02/12/2023 1529 by Waldon Merl, RN Outcome: Progressing 02/12/2023 1517 by Waldon Merl, RN Outcome: Progressing   Problem: Pain Management: Goal: Pain level will decrease with appropriate interventions 02/12/2023 1529 by Waldon Merl, RN Outcome: Progressing 02/12/2023 1517 by Waldon Merl, RN Outcome: Progressing   Problem: Skin Integrity: Goal: Will show signs of wound healing 02/12/2023 1529 by Waldon Merl, RN Outcome: Progressing 02/12/2023 1517 by Waldon Merl, RN Outcome: Progressing

## 2023-02-13 ENCOUNTER — Encounter (HOSPITAL_COMMUNITY): Payer: Self-pay | Admitting: Orthopedic Surgery

## 2023-02-13 DIAGNOSIS — M1611 Unilateral primary osteoarthritis, right hip: Secondary | ICD-10-CM | POA: Diagnosis not present

## 2023-02-13 LAB — CBC
HCT: 29.7 % — ABNORMAL LOW (ref 39.0–52.0)
Hemoglobin: 10.1 g/dL — ABNORMAL LOW (ref 13.0–17.0)
MCH: 31.7 pg (ref 26.0–34.0)
MCHC: 34 g/dL (ref 30.0–36.0)
MCV: 93.1 fL (ref 80.0–100.0)
Platelets: 192 10*3/uL (ref 150–400)
RBC: 3.19 MIL/uL — ABNORMAL LOW (ref 4.22–5.81)
RDW: 11.9 % (ref 11.5–15.5)
WBC: 18.8 10*3/uL — ABNORMAL HIGH (ref 4.0–10.5)
nRBC: 0 % (ref 0.0–0.2)

## 2023-02-13 LAB — BASIC METABOLIC PANEL
Anion gap: 7 (ref 5–15)
BUN: 24 mg/dL — ABNORMAL HIGH (ref 8–23)
CO2: 24 mmol/L (ref 22–32)
Calcium: 8.3 mg/dL — ABNORMAL LOW (ref 8.9–10.3)
Chloride: 92 mmol/L — ABNORMAL LOW (ref 98–111)
Creatinine, Ser: 1.27 mg/dL — ABNORMAL HIGH (ref 0.61–1.24)
GFR, Estimated: 56 mL/min — ABNORMAL LOW (ref >60)
Glucose, Bld: 145 mg/dL — ABNORMAL HIGH (ref 70–99)
Potassium: 4 mmol/L (ref 3.5–5.1)
Sodium: 123 mmol/L — ABNORMAL LOW (ref 135–145)

## 2023-02-13 MED ORDER — ASPIRIN 325 MG PO TBEC: 325.0000 mg | DELAYED_RELEASE_TABLET | Freq: Every day | ORAL | 0 refills | Status: AC

## 2023-02-13 MED ORDER — CYCLOBENZAPRINE HCL 5 MG PO TABS: 10.0000 mg | ORAL_TABLET | Freq: Three times a day (TID) | ORAL | 0 refills | Status: DC | PRN

## 2023-02-13 MED ORDER — HYDROCODONE-ACETAMINOPHEN 5-325 MG PO TABS: 1.0000 | ORAL_TABLET | Freq: Four times a day (QID) | ORAL | 0 refills | Status: DC | PRN

## 2023-02-13 NOTE — Progress Notes (Signed)
Provided discharge education/instructions, all questions answered. Pt is not in any distress, discharged home with belongings accompanied by daughter.

## 2023-02-13 NOTE — TOC Transition Note (Signed)
Transition of Care [facility]) - CM/SW Discharge Note   Patient Details  Name: Kenneth Montoya MRN: [id number] Date of Birth: 03-19-1941  [facility] of [facility] CM/SW Contact:  Amada Jupiter, LCSW Phone Number: 02/13/2023, 10:20 AM   Clinical Narrative:     Met with pt who confirms he has needed DME in the home.  Plan for HEP.  No further TOC needs.  Final next level of care: Home/Self Care Barriers to Discharge: No Barriers Identified   Patient Goals and CMS Choice      Discharge Placement                         Discharge Plan and Services Additional resources added to the After Visit Summary for                  DME Arranged: N/A                    Social Determinants of Health (SDOH) Interventions SDOH Screenings   Food Insecurity: No Food Insecurity (02/12/2023)  Housing: Low Risk  (02/12/2023)  Transportation Needs: No Transportation Needs (02/12/2023)  Utilities: Not At Risk (02/12/2023)  Alcohol Screen: Low Risk  (03/04/2022)  Depression (PHQ2-9): Low Risk  (04/29/2022)  Financial Resource Strain: Low Risk  (03/04/2022)  Physical Activity: Sufficiently Active (03/04/2022)  Social Connections: Socially Integrated (03/04/2022)  Stress: No Stress Concern Present (03/04/2022)  Tobacco Use: Low Risk  (02/12/2023)     Readmission Risk Interventions     No data to display

## 2023-02-13 NOTE — Progress Notes (Signed)
Physical Therapy Treatment Patient Details Name: Kenneth Montoya MRN: [id number] DOB: 12/28/1940 Today's Date: 02/13/2023   History of Present Illness 82 yo male presents to therapy s/p R THA, anterior approach on 02/12/2023 due to failure of conservative measures. Pt PMH includes but is not limited to: lumbar stenosis s/p; laminectomy, TIA, CVA, syndrome of inappropriate ADH prodition, hyponatremia, HLD, CGA, BPH, HTN, GERD, herpes, L TCR, B reverse TSA, and R TKA.    PT Comments  The patient talking  very much, requires frequent cues to listen to therapist and stay on task.  Daughter present  and expresses concern that patient will be home with wife who has dementia, and no other support ans daughter works.  RN aware of concerns.  Patient's gait is steady, just easily self distracting. Will practice 1 step next visit.    If plan is discharge home, recommend the following: A little help with walking and/or transfers;A little help with bathing/dressing/bathroom;Assistance with feeding;Assist for transportation;Help with stairs or ramp for entrance   Can travel by private vehicle        Equipment Recommendations  None recommended by PT    Recommendations for Other Services       Precautions / Restrictions Precautions Precautions: Fall Restrictions Weight Bearing Restrictions: Yes RLE Weight Bearing: Weight bearing as tolerated     Mobility  Bed Mobility               General bed mobility comments: in recliner    Transfers Overall transfer level: Needs assistance Equipment used: Rolling walker (2 wheels) Transfers: Sit to/from Stand Sit to Stand: Contact guard assist           General transfer comment: cues for safety and proper technique    Ambulation/Gait Ambulation/Gait assistance: Contact guard assist Gait Distance (Feet): 120 Feet Assistive device: Rolling walker (2 wheels) Gait Pattern/deviations: Step-to pattern, Antalgic, Trunk flexed Gait velocity:  fast cadence, cues to slow down     General Gait Details: multiple cues cues for safety, especially with turning, tends to turn quickly and Rw off ground   Stairs             Wheelchair Mobility     Tilt Bed    Modified Rankin (Stroke Patients Only)       Balance Overall balance assessment: Mild deficits observed, not formally tested                                          Cognition Arousal: Alert Behavior During Therapy: WFL for tasks assessed/performed, Impulsive Overall Cognitive Status: Within Functional Limits for tasks assessed                                 General Comments: verbose, much redirection for activity and safety        Exercises      General Comments        Pertinent Vitals/Pain Pain Assessment Pain Assessment: Faces Faces Pain Scale: Hurts even more Pain Location: R hip and LE Pain Descriptors / Indicators: Discomfort, Guarding Pain Intervention(s): Premedicated before session    Home Living                          Prior Function  PT Goals (current goals can now be found in the care plan section) Progress towards PT goals: Progressing toward goals    Frequency    7X/week      PT Plan      Co-evaluation              AM-PAC PT "6 Clicks" Mobility   Outcome Measure  Help needed turning from your back to your side while in a flat bed without using bedrails?: A Little Help needed moving from lying on your back to sitting on the side of a flat bed without using bedrails?: A Little Help needed moving to and from a bed to a chair (including a wheelchair)?: A Little Help needed standing up from a chair using your arms (e.g., wheelchair or bedside chair)?: A Little Help needed to walk in hospital room?: A Little Help needed climbing 3-5 steps with a railing? : A Little 6 Click Score: 18    End of Session Equipment Utilized During Treatment: Gait belt Activity  Tolerance: Patient tolerated treatment well Patient left: in chair;with call bell/phone within reach;with family/visitor present Nurse Communication: Mobility status PT Visit Diagnosis: Unsteadiness on feet (R26.81);Other abnormalities of gait and mobility (R26.89);Muscle weakness (generalized) (M62.81);Difficulty in walking, not elsewhere classified (R26.2);Pain Pain - Right/Left: Right Pain - part of body: Leg;Hip     Time: 1020-1050 PT Time Calculation (min) (ACUTE ONLY): 30 min  Charges:    $Gait Training: 23-37 mins PT General Charges $$ ACUTE PT VISIT: 1 Visit                     Blanchard Kelch PT Acute Rehabilitation Services Office [phone] Weekend pager-[phone]    Rada Hay 02/13/2023, 12:48 PM

## 2023-02-13 NOTE — Progress Notes (Signed)
   Subjective: 1 Day Post-Op Procedure(s) (LRB): RIGHT TOTAL HIP ARTHROPLASTY ANTERIOR APPROACH (Right) Patient seen in rounds by Dr. Lequita Halt. Patient is well, and has had no acute complaints or problems. Denies SOB or chest pain. Denies calf pain. Patient reports pain as moderate. Worked with physical therapy yesterday and ambulated 15'. Was limited by pain. We will continue physical therapy today.   Objective: Vital signs in last 24 hours: Temp:  [97.5 F (36.4 C)-97.8 F (36.6 C)] 97.8 F (36.6 C) (09/19 0643) Pulse Rate:  [77-86] 77 (09/19 0643) Resp:  [15-18] 15 (09/19 0643) BP: (122-168)/(67-99) 122/75 (09/19 0643) SpO2:  [92 %-100 %] 100 % (09/19 0643)  Intake/Output from previous day:  Intake/Output Summary (Last 24 hours) at 02/13/2023 0836 Last data filed at 02/13/2023 0400 Gross per 24 hour  Intake 2520.55 ml  Output 1750 ml  Net 770.55 ml     Intake/Output this shift: No intake/output data recorded.  Labs: Recent Labs    02/13/23 0429  HGB 10.1*   Recent Labs    02/13/23 0429  WBC 18.8*  RBC 3.19*  HCT 29.7*  PLT 192   Recent Labs    02/13/23 0429  NA 123*  K 4.0  CL 92*  CO2 24  BUN 24*  CREATININE 1.27*  GLUCOSE 145*  CALCIUM 8.3*   No results for input(s): "LABPT", "INR" in the last 72 hours.  Exam: General - Patient is Alert and Oriented Extremity - Neurologically intact Neurovascular intact Sensation intact distally Dorsiflexion/Plantar flexion intact Dressing - dressing C/D/I Motor Function - intact, moving foot and toes well on exam.  Past Medical History:  Diagnosis Date   Anxiety    Arthritis    generalized   BPH (benign prostatic hyperplasia)    Cancer of skin, squamous cell    frozen    Cataract    bilateral sx   GERD (gastroesophageal reflux disease)    on meds   Hypertension    on meds   Shingles    SIADH (syndrome of inappropriate ADH production) (HCC)    Stroke (HCC)     Assessment/Plan: 1 Day Post-Op  Procedure(s) (LRB): RIGHT TOTAL HIP ARTHROPLASTY ANTERIOR APPROACH (Right) Principal Problem:   OA (osteoarthritis) of hip Active Problems:   Osteoarthritis of right hip  Estimated body mass index is 31.32 kg/m as calculated from the following:   Height as of this encounter: 5\' 7"  (1.702 m).   Weight as of this encounter: 90.7 kg. Advance diet Up with therapy D/C IV fluids  DVT Prophylaxis -  Plavix + Aspirin Weight bearing as tolerated.  Continue physical therapy. Expected discharge home today pending progress with physical therapy and if meeting patient goals. Will do HEP once discharged. Follow-up in clinic in 2 weeks.  Alfonzo Feller, PA-C Orthopedic Surgery 02/13/2023, 8:36 AM

## 2023-02-13 NOTE — Care Management Obs Status (Signed)
MEDICARE OBSERVATION STATUS NOTIFICATION   Patient Details  Name: Kenneth Montoya MRN: [id number] Date of Birth: 1940/07/01   Medicare Observation Status Notification Given:  Hart Robinsons, LCSW 02/13/2023, 1:54 PM

## 2023-02-13 NOTE — Progress Notes (Signed)
Physical Therapy Treatment Patient Details Name: Kenneth Montoya MRN: [id number] DOB: 02-04-1941 Today's Date: 02/13/2023   History of Present Illness 82 yo male presents to therapy s/p R THA, anterior approach on 02/12/2023 due to failure of conservative measures. Pt PMH includes but is not limited to: lumbar stenosis s/p; laminectomy, TIA, CVA, syndrome of inappropriate ADH prodition, hyponatremia, HLD, CGA, BPH, HTN, GERD, herpes, L TCR, B reverse TSA, and R TKA.    PT Comments   Reviewed with patient to ambulate slowly, use RW at all times until MD visit who will guide him to use of cane. Patient instructed in   hip exercises.  Patient's daughter asking for resources for private pay caregivers temporarily  as wife unable to assist.   Patient's gait is steady with RW. Plans are for Dc home. RN communicating with PA about daughter's concerns for Patient DC without extra support.   If plan is discharge home, recommend the following: A little help with walking and/or transfers;A little help with bathing/dressing/bathroom;Assistance with feeding;Assist for transportation;Help with stairs or ramp for entrance   Can travel by private vehicle        Equipment Recommendations  None recommended by PT    Recommendations for Other Services       Precautions / Restrictions Precautions Precautions: Fall Restrictions Weight Bearing Restrictions: Yes RLE Weight Bearing: Weight bearing as tolerated     Mobility  Bed Mobility               General bed mobility comments: in recliner    Transfers Overall transfer level: Needs assistance Equipment used: Rolling walker (2 wheels) Transfers: Sit to/from Stand Sit to Stand: Supervision           General transfer comment: cues for safety and proper technique    Ambulation/Gait Ambulation/Gait assistance: Supervision Gait Distance (Feet): 50 Feet Assistive device: Rolling walker (2 wheels) Gait Pattern/deviations: Step-through  pattern Gait velocity: fast cadence, cues to slow down     General Gait Details: patient self modulating  speed, states"slow"   Stairs Stairs: Yes Stairs assistance: Contact guard assist Stair Management: No rails, Forwards, With walker Number of Stairs: 1 General stair comments: practiced x 2   Wheelchair Mobility     Tilt Bed    Modified Rankin (Stroke Patients Only)       Balance Overall balance assessment: Mild deficits observed, not formally tested                                          Cognition Arousal: Alert Behavior During Therapy: WFL for tasks assessed/performed, Impulsive Overall Cognitive Status: Within Functional Limits for tasks assessed                                 General Comments: verbose, much redirection for activity and safety        Exercises Total Joint Exercises Ankle Circles/Pumps: AROM, Both Heel Slides: AROM, Right Hip ABduction/ADduction: AROM, Right, 10 reps Long Arc Quad: AROM, Right, 10 reps Other Exercises Other Exercises: demonstrated standing exercises for right leg only to star 9/23. patient placed information in his phone calendar    General Comments        Pertinent Vitals/Pain Pain Assessment Pain Assessment: Faces Faces Pain Scale: Hurts even more Pain Location: R hip and LE Pain Descriptors /  Indicators: Discomfort, Guarding Pain Intervention(s): Premedicated before session    Home Living                          Prior Function            PT Goals (current goals can now be found in the care plan section) Progress towards PT goals: Progressing toward goals    Frequency    7X/week      PT Plan      Co-evaluation              AM-PAC PT "6 Clicks" Mobility   Outcome Measure  Help needed turning from your back to your side while in a flat bed without using bedrails?: A Little Help needed moving from lying on your back to sitting on the side of a  flat bed without using bedrails?: A Little Help needed moving to and from a bed to a chair (including a wheelchair)?: A Little Help needed standing up from a chair using your arms (e.g., wheelchair or bedside chair)?: A Little Help needed to walk in hospital room?: A Little Help needed climbing 3-5 steps with a railing? : A Little 6 Click Score: 18    End of Session Equipment Utilized During Treatment: Gait belt Activity Tolerance: Patient tolerated treatment well Patient left: in chair;with call bell/phone within reach;with chair alarm set;with family/visitor present Nurse Communication: Mobility status PT Visit Diagnosis: Unsteadiness on feet (R26.81);Other abnormalities of gait and mobility (R26.89);Muscle weakness (generalized) (M62.81);Difficulty in walking, not elsewhere classified (R26.2);Pain Pain - Right/Left: Right Pain - part of body: Leg;Hip     Time: 1100-1120 PT Time Calculation (min) (ACUTE ONLY): 20 min  Charges:    $Gait Training: 8-22 mins PT General Charges $$ ACUTE PT VISIT: 1 Visit                     Blanchard Kelch PT Acute Rehabilitation Services Office [phone] Weekend [phone]    Rada Hay 02/13/2023, 12:54 PM

## 2023-02-13 NOTE — Plan of Care (Addendum)
11:52  Lurena Joiner S.-PA made aware that Pt's daughter is concern about Pt going home d/t Pt's wife having mild dementia. Pt cleared by PT to go home safely. RN advised daughter to  provide assistance at home as much as possible. Discharge planning was done prior to admission.   Problem: Education: Goal: Knowledge of the prescribed therapeutic regimen will improve Outcome: Progressing   Problem: Activity: Goal: Ability to avoid complications of mobility impairment will improve Outcome: Progressing   Problem: Clinical Measurements: Goal: Postoperative complications will be avoided or minimized Outcome: Progressing   Problem: Pain Management: Goal: Pain level will decrease with appropriate interventions Outcome: Progressing   Problem: Skin Integrity: Goal: Will show signs of wound healing Outcome: Progressing

## 2023-02-14 ENCOUNTER — Other Ambulatory Visit: Payer: Self-pay | Admitting: Internal Medicine

## 2023-02-14 DIAGNOSIS — I6300 Cerebral infarction due to thrombosis of unspecified precerebral artery: Secondary | ICD-10-CM

## 2023-02-14 DIAGNOSIS — I63423 Cerebral infarction due to embolism of bilateral anterior cerebral arteries: Secondary | ICD-10-CM

## 2023-02-14 NOTE — Discharge Summary (Signed)
Physician Discharge Summary   Patient ID: Kenneth Montoya MRN: [id number] DOB/AGE: 1940-09-10 82 y.o.  Admit date: 02/12/2023 Discharge date: 02/13/2023  Primary Diagnosis: Osteoarthritis of the right hip   Admission Diagnoses:  Past Medical History:  Diagnosis Date   Anxiety    Arthritis    generalized   BPH (benign prostatic hyperplasia)    Cancer of skin, squamous cell    frozen    Cataract    bilateral sx   GERD (gastroesophageal reflux disease)    on meds   Hypertension    on meds   Shingles    SIADH (syndrome of inappropriate ADH production) (HCC)    Stroke [facility])    Discharge Diagnoses:   Principal Problem:   OA (osteoarthritis) of hip Active Problems:   Osteoarthritis of right hip  Estimated body mass index is 31.32 kg/m as calculated from the following:   Height as of this encounter: 5\' 7"  (1.702 m).   Weight as of this encounter: 90.7 kg.  Procedure:  Procedure(s) (LRB): RIGHT TOTAL HIP ARTHROPLASTY ANTERIOR APPROACH (Right)   Consults: None  HPI: Kenneth Montoya is a 82 y.o. male who has advanced end-stage arthritis of their Right  hip with progressively worsening pain and dysfunction.The patient has failed nonoperative management and presents for  total hip arthroplasty.   Laboratory Data: Admission on 02/12/2023, Discharged on 02/13/2023  Component Date Value Ref Range Status   WBC 02/13/2023 18.8 (H)  4.0 - 10.5 K/uL Final   RBC 02/13/2023 3.19 (L)  4.22 - 5.81 MIL/uL Final   Hemoglobin 02/13/2023 10.1 (L)  13.0 - 17.0 g/dL Final   HCT 08/65/7846 29.7 (L)  39.0 - 52.0 % Final   MCV 02/13/2023 93.1  80.0 - 100.0 fL Final   MCH 02/13/2023 31.7  26.0 - 34.0 pg Final   MCHC 02/13/2023 34.0  30.0 - 36.0 g/dL Final   RDW 96/29/5284 11.9  11.5 - 15.5 % Final   Platelets 02/13/2023 192  150 - 400 K/uL Final   nRBC 02/13/2023 0.0  0.0 - 0.2 % Final   Performed at [facility], [address], [place], [place] [address]   Sodium  02/13/2023 123 (L)  135 - 145 mmol/L Final   Potassium 02/13/2023 4.0  3.5 - 5.1 mmol/L Final   Chloride 02/13/2023 92 (L)  98 - 111 mmol/L Final   CO2 02/13/2023 24  22 - 32 mmol/L Final   Glucose, Bld 02/13/2023 145 (H)  70 - 99 mg/dL Final   Glucose reference range applies only to samples taken after fasting for at least 8 hours.   BUN 02/13/2023 24 (H)  8 - 23 mg/dL Final   Creatinine, Ser 02/13/2023 1.27 (H)  0.61 - 1.24 mg/dL Final   Calcium 05/29/7251 8.3 (L)  8.9 - 10.3 mg/dL Final   GFR, Estimated 02/13/2023 56 (L)  >60 mL/min Final   Comment: (NOTE) Calculated using the CKD-EPI Creatinine Equation (2021)    Anion gap 02/13/2023 7  5 - 15 Final   Performed at [facility], [address], [place], [place] [address]  Hospital Outpatient Visit on 02/04/2023  Component Date Value Ref Range Status   MRSA, PCR 02/04/2023 NEGATIVE  NEGATIVE Final   Staphylococcus aureus 02/04/2023 POSITIVE (A)  NEGATIVE Final   Comment: (NOTE) The Xpert SA Assay (FDA approved for NASAL specimens in patients 48 years of age and older), is one component of a comprehensive surveillance program. It is not  intended to diagnose infection nor to guide or monitor treatment. Performed at [facility], [address] [place], [place] [address]    Sodium 02/04/2023 129 (L)  135 - 145 mmol/L Final   Potassium 02/04/2023 4.6  3.5 - 5.1 mmol/L Final   Chloride 02/04/2023 91 (L)  98 - 111 mmol/L Final   CO2 02/04/2023 27  22 - 32 mmol/L Final   Glucose, Bld 02/04/2023 100 (H)  70 - 99 mg/dL Final   Glucose reference range applies only to samples taken after fasting for at least 8 hours.   BUN 02/04/2023 19  8 - 23 mg/dL Final   Creatinine, Ser 02/04/2023 0.96  0.61 - 1.24 mg/dL Final   Calcium 52/84/1324 9.5  8.9 - 10.3 mg/dL Final   GFR, Estimated 02/04/2023 >60  >60 mL/min Final   Comment: (NOTE) Calculated using the CKD-EPI Creatinine Equation (2021)    Anion gap  02/04/2023 11  5 - 15 Final   Performed at [facility], [address] [place], [place] [address]   WBC 02/04/2023 8.9  4.0 - 10.5 K/uL Final   RBC 02/04/2023 4.02 (L)  4.22 - 5.81 MIL/uL Final   Hemoglobin 02/04/2023 12.6 (L)  13.0 - 17.0 g/dL Final   HCT 72/53/6644 37.5 (L)  39.0 - 52.0 % Final   MCV 02/04/2023 93.3  80.0 - 100.0 fL Final   MCH 02/04/2023 31.3  26.0 - 34.0 pg Final   MCHC 02/04/2023 33.6  30.0 - 36.0 g/dL Final   RDW 03/47/4259 11.9  11.5 - 15.5 % Final   Platelets 02/04/2023 222  150 - 400 K/uL Final   nRBC 02/04/2023 0.0  0.0 - 0.2 % Final   Performed at [facility], [address] [place], [place] [address]   ABO/RH(D) 02/04/2023 O POS   Final   Antibody Screen 02/04/2023 NEG   Final   Sample Expiration 02/04/2023 02/15/2023,2359   Final   Extend sample reason 02/04/2023    Final                   Value:NO TRANSFUSIONS OR PREGNANCY IN THE PAST 3 MONTHS Performed at [facility], [address], [place], [place] [address]   Office Visit on 02/04/2023  Component Date Value Ref Range Status   Sodium, Ur 02/04/2023 107  28 - 272 mmol/L Final   Color, Urine 02/04/2023 YELLOW  Yellow;Lt. Yellow;Straw;Dark Yellow;Amber;Green;Red;Brown Final   APPearance 02/04/2023 CLEAR  Clear;Turbid;Slightly Cloudy;Cloudy Final   Specific Gravity, Urine 02/04/2023 1.015  1.000 - 1.030 Final   pH 02/04/2023 7.5  5.0 - 8.0 Final   Total Protein, Urine 02/04/2023 NEGATIVE  Negative Final   Urine Glucose 02/04/2023 NEGATIVE  Negative Final   Ketones, ur 02/04/2023 NEGATIVE  Negative Final   Bilirubin Urine 02/04/2023 NEGATIVE  Negative Final   Hgb urine dipstick 02/04/2023 NEGATIVE  Negative Final   Urobilinogen, UA 02/04/2023 0.2  0.0 - 1.0 Final   Leukocytes,Ua 02/04/2023 NEGATIVE  Negative Final   Nitrite 02/04/2023 NEGATIVE  Negative Final   WBC, UA 02/04/2023 3-6/hpf (A)  0-2/hpf Final   RBC / HPF 02/04/2023 none seen   0-2/hpf Final   Squamous Epithelial / HPF 02/04/2023 Rare(0-4/hpf)  Rare(0-4/hpf) Final   Sodium 02/04/2023 131 (L)  135 - 145 mEq/L Final   Potassium 02/04/2023 4.1  3.5 - 5.1 mEq/L Final   Chloride 02/04/2023 94 (L)  96 - 112 mEq/L Final   CO2  02/04/2023 29  19 - 32 mEq/L Final   Glucose, Bld 02/04/2023 93  70 - 99 mg/dL Final   BUN 40/98/1191 17  6 - 23 mg/dL Final   Creatinine, Ser 02/04/2023 0.97  0.40 - 1.50 mg/dL Final   GFR 47/82/9562 72.23  >60.00 mL/min Final   Calculated using the CKD-EPI Creatinine Equation (2021)   Calcium 02/04/2023 9.3  8.4 - 10.5 mg/dL Final   PSA 13/12/6576 2.75  0.10 - 4.00 ng/mL Final   Test performed using Access [employer] PSA Assay, a parmagnetic partical, chemiluminecent immunoassay.   TSH 02/04/2023 2.25  0.35 - 5.50 uIU/mL Final   WBC 02/04/2023 8.4  4.0 - 10.5 K/uL Final   RBC 02/04/2023 4.25  4.22 - 5.81 Mil/uL Final   Hemoglobin 02/04/2023 13.2  13.0 - 17.0 g/dL Final   HCT 46/96/2952 39.9  39.0 - 52.0 % Final   MCV 02/04/2023 93.8  78.0 - 100.0 fl Final   MCHC 02/04/2023 33.1  30.0 - 36.0 g/dL Final   RDW 84/13/2440 12.6  11.5 - 15.5 % Final   Platelets 02/04/2023 254.0  150.0 - 400.0 K/uL Final   Neutrophils Relative % 02/04/2023 69.0  43.0 - 77.0 % Final   Lymphocytes Relative 02/04/2023 16.6  12.0 - 46.0 % Final   Monocytes Relative 02/04/2023 8.6  3.0 - 12.0 % Final   Eosinophils Relative 02/04/2023 5.0  0.0 - 5.0 % Final   Basophils Relative 02/04/2023 0.8  0.0 - 3.0 % Final   Neutro Abs 02/04/2023 5.8  1.4 - 7.7 K/uL Final   Lymphs Abs 02/04/2023 1.4  0.7 - 4.0 K/uL Final   Monocytes Absolute 02/04/2023 0.7  0.1 - 1.0 K/uL Final   Eosinophils Absolute 02/04/2023 0.4  0.0 - 0.7 K/uL Final   Basophils Absolute 02/04/2023 0.1  0.0 - 0.1 K/uL Final  Office Visit on 12/31/2022  Component Date Value Ref Range Status   Cholesterol, Total 12/31/2022 163  100 - 199 mg/dL Final   Triglycerides 03/23/2535 75  0 - 149 mg/dL Final   HDL  64/40/3474 85  >39 mg/dL Final   VLDL Cholesterol Cal 12/31/2022 14  5 - 40 mg/dL Final   LDL Chol Calc (NIH) 12/31/2022 64  0 - 99 mg/dL Final   Chol/HDL Ratio 12/31/2022 1.9  0.0 - 5.0 ratio Final   Comment:                                   T. Chol/HDL Ratio                                             Men  Women                               1/2 Avg.Risk  3.4    3.3                                   Avg.Risk  5.0    4.4                                2X Avg.Risk  9.6    7.1                                3X Avg.Risk 23.4   11.0    Total Protein 12/31/2022 6.3  6.0 - 8.5 g/dL Final   Albumin 64/40/3474 4.5  3.7 - 4.7 g/dL Final   Bilirubin Total 12/31/2022 0.5  0.0 - 1.2 mg/dL Final   Bilirubin, Direct 12/31/2022 0.19  0.00 - 0.40 mg/dL Final   Alkaline Phosphatase 12/31/2022 104  44 - 121 IU/L Final   AST 12/31/2022 23  0 - 40 IU/L Final   ALT 12/31/2022 15  0 - 44 IU/L Final     X-Rays:DG HIP UNILAT WITH PELVIS 1V RIGHT  Result Date: 02/12/2023 CLINICAL DATA:  Elective surgery EXAM: DG HIP (WITH OR WITHOUT PELVIS) 1V RIGHT COMPARISON:  None Available. FINDINGS: Two fluoroscopic spot views of the pelvis and right hip obtained in the operating room. Images demonstrate right hip arthroplasty. Fluoroscopy time 5 seconds. Dose 1.0998 mGy. IMPRESSION: Intraoperative fluoroscopy during right hip arthroplasty. Electronically Signed   By: Narda Rutherford M.D.   On: 02/12/2023 11:53   DG C-Arm 1-60 Min-No Report  Result Date: 02/12/2023 Fluoroscopy was utilized by the requesting physician.  No radiographic interpretation.    EKG: Orders placed or performed in visit on 12/31/22   EKG 12-Lead     Hospital Course: Kenneth Montoya is a 82 y.o. who was admitted to [facility]. They were brought to the operating room on 02/12/2023 and underwent Procedure(s): RIGHT TOTAL HIP ARTHROPLASTY ANTERIOR APPROACH.  Patient tolerated the procedure well and was later transferred to the recovery  room and then to the orthopaedic floor for postoperative care. They were given PO and IV analgesics for pain control following their surgery. They were given 24 hours of postoperative antibiotics of  Anti-infectives (From admission, onward)    Start     Dose/Rate Route Frequency Ordered Stop   02/12/23 1830  ceFAZolin (ANCEF) IVPB 2g/100 mL premix        2 g 200 mL/hr over 30 Minutes Intravenous Every 6 hours 02/12/23 1423 02/13/23 1129   02/12/23 0815  ceFAZolin (ANCEF) IVPB 2g/100 mL premix        2 g 200 mL/hr over 30 Minutes Intravenous On call to O.R. 02/12/23 2595 02/12/23 1105     and started on DVT prophylaxis in the form of  Plavix and Aspirin .   PT and OT were ordered for total joint protocol. Discharge planning consulted to help with postop disposition and equipment needs.  Patient had a fair night on the evening of surgery. They started to get up OOB with therapy on POD #0. Pt was seen during rounds and was ready to go home pending progress with therapy. He worked with therapy on POD #1 and was meeting his goals. Pt was discharged to home later that day in stable condition.  Diet: Cardiac diet Activity: WBAT Follow-up: in 2 weeks Disposition: Home Discharged Condition: stable   Discharge Instructions     Call MD / Call 911   Complete by: As directed    If you experience chest pain or shortness of breath, CALL 911 and be transported to the hospital emergency room.  If you develope a fever above 101 F, pus (white drainage) or increased drainage or redness at the wound, or calf pain, call your surgeon's office.   Change dressing  Complete by: As directed    You have an adhesive waterproof bandage over the incision. Leave this in place until your first follow-up appointment. Once you remove this you will not need to place another bandage.   Constipation Prevention   Complete by: As directed    Drink plenty of fluids.  Prune juice may be helpful.  You may use a stool softener,  such as Colace (over the counter) 100 mg twice a day.  Use MiraLax (over the counter) for constipation as needed.   Diet - low sodium heart healthy   Complete by: As directed    Do not sit on low chairs, stoools or toilet seats, as it may be difficult to get up from low surfaces   Complete by: As directed    Driving restrictions   Complete by: As directed    No driving for two weeks   Post-operative opioid taper instructions:   Complete by: As directed    POST-OPERATIVE OPIOID TAPER INSTRUCTIONS: It is important to wean off of your opioid medication as soon as possible. If you do not need pain medication after your surgery it is ok to stop day one. Opioids include: Codeine, Hydrocodone(Norco, Vicodin), Oxycodone(Percocet, oxycontin) and hydromorphone amongst others.  Long term and even short term use of opiods can cause: Increased pain response Dependence Constipation Depression Respiratory depression And more.  Withdrawal symptoms can include Flu like symptoms Nausea, vomiting And more Techniques to manage these symptoms Hydrate well Eat regular healthy meals Stay active Use relaxation techniques(deep breathing, meditating, yoga) Do Not substitute Alcohol to help with tapering If you have been on opioids for less than two weeks and do not have pain than it is ok to stop all together.  Plan to wean off of opioids This plan should start within one week post op of your joint replacement. Maintain the same interval or time between taking each dose and first decrease the dose.  Cut the total daily intake of opioids by one tablet each day Next start to increase the time between doses. The last dose that should be eliminated is the evening dose.      TED hose   Complete by: As directed    Use stockings (TED hose) for three weeks on both leg(s).  You may remove them at night for sleeping.   Weight bearing as tolerated   Complete by: As directed       Allergies as of 02/13/2023        Reactions   Percocet [oxycodone-acetaminophen] Itching        Medication List     TAKE these medications    amLODipine 10 MG tablet Commonly known as: NORVASC TAKE 1 TABLET BY MOUTH AT  BEDTIME   ascorbic acid 1000 MG tablet Commonly known as: VITAMIN C Take 1,000 mg by mouth at bedtime. Notes to patient: Resume home regimen   aspirin EC 325 MG tablet Take 1 tablet (325 mg total) by mouth daily for 20 days. Take in addition to the Plavix.   atorvastatin 10 MG tablet Commonly known as: LIPITOR TAKE 1 TABLET(10 MG) BY MOUTH DAILY   carvedilol 6.25 MG tablet Commonly known as: COREG Take 1 tablet (6.25 mg total) by mouth 2 (two) times daily.   Claritin 10 MG tablet Generic drug: loratadine Take 10 mg by mouth daily.   cyclobenzaprine 5 MG tablet Commonly known as: FLEXERIL Take 2 tablets (10 mg total) by mouth 3 (three) times daily as needed for muscle  spasms.   DULoxetine 30 MG capsule Commonly known as: Cymbalta Take 1 capsule (30 mg total) by mouth daily.   fluticasone 50 MCG/ACT nasal spray Commonly known as: FLONASE Place 2 sprays into both nostrils daily. Notes to patient: Resume home regimen   hydrochlorothiazide 12.5 MG capsule Commonly known as: MICROZIDE Take 1 capsule (12.5 mg total) by mouth daily.   HYDROcodone-acetaminophen 5-325 MG tablet Commonly known as: NORCO/VICODIN Take 1-2 tablets by mouth every 6 (six) hours as needed for severe pain. What changed:  how much to take how to take this when to take this reasons to take this additional instructions Notes to patient: Last dose given 09/19 09:43am   losartan 100 MG tablet Commonly known as: COZAAR TAKE 1 TABLET BY MOUTH DAILY   olopatadine 0.1 % ophthalmic solution Commonly known as: PATANOL Place 1 drop into both eyes at bedtime. Notes to patient: Resume home regimen   PreviDent 5000 Plus 1.1 % Crea dental cream Generic drug: sodium fluoride Place 1 Application onto  teeth in the morning and at bedtime. Notes to patient: Resume home regimen   psyllium 58.6 % powder Commonly known as: METAMUCIL Take 1 packet by mouth at bedtime. Notes to patient: Resume home regimen   RABEprazole 20 MG tablet Commonly known as: Aciphex Take 1 tablet (20 mg total) by mouth daily. Notes to patient: Resume home regimen   tamsulosin 0.4 MG Caps capsule Commonly known as: FLOMAX Take 0.4 mg by mouth at bedtime.   traZODone 50 MG tablet Commonly known as: DESYREL Take 25 mg by mouth at bedtime.               Discharge Care Instructions  (From admission, onward)           Start     Ordered   02/13/23 0000  Weight bearing as tolerated        02/13/23 0855   02/13/23 0000  Change dressing       Comments: You have an adhesive waterproof bandage over the incision. Leave this in place until your first follow-up appointment. Once you remove this you will not need to place another bandage.   02/13/23 0855            Follow-up Information     Ollen Gross, MD. Go on 02/25/2023.   Specialty: Orthopedic Surgery Why: You are scheduled for first post op appt on Tuesday October 1 at 2:00pm. Contact information: [address] [place] [address] [phone]                 Signed: R. Arcola Jansky, PA-C Orthopedic Surgery 02/14/2023, 3:14 PM

## 2023-02-25 ENCOUNTER — Telehealth: Payer: Self-pay | Admitting: Internal Medicine

## 2023-02-25 NOTE — Telephone Encounter (Signed)
Pt called stating he just had a hip replacement 2 weeks ago and was wondering could he get a diuretic advise may have to have an OV. Please advise. Pt also stated he is having some swelling.

## 2023-02-27 IMAGING — MR MR MRA NECK W/O CM
2 series · 17 of 48 positions shown · non-contrast
Comparison: Prior brain MRI from earlier the same day.

CLINICAL DATA: Follow-up examination for acute stroke.

EXAM:
MRA HEAD WITHOUT CONTRAST
MRA NECK WITHOUT CONTRAST
TECHNIQUE: Angiographic images of the Circle of Willis were acquired using MRA
technique without intravenous contrast. Angiographic images of the
neck were acquired using MRA technique without intravenous contrast.
Carotid stenosis measurements (when applicable) are obtained
utilizing NASCET criteria, using the distal internal carotid
diameter as the denominator.

[Series 4: TOF · axial · 2.4mm · 0.47mm/px · z∈[-277,-107]mm · 12 of 152 slices shown (1 of 2)]
[im 1/152]
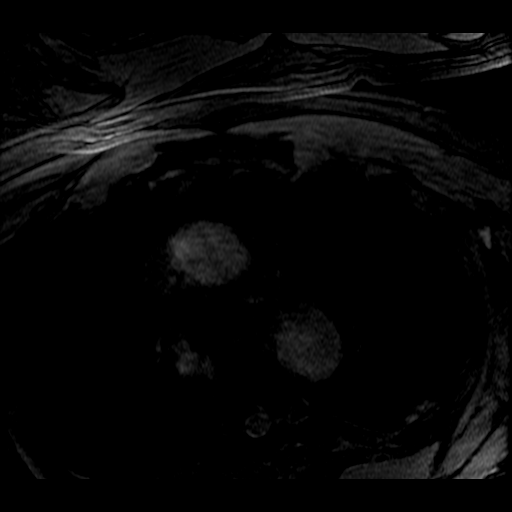
[im 8/152]
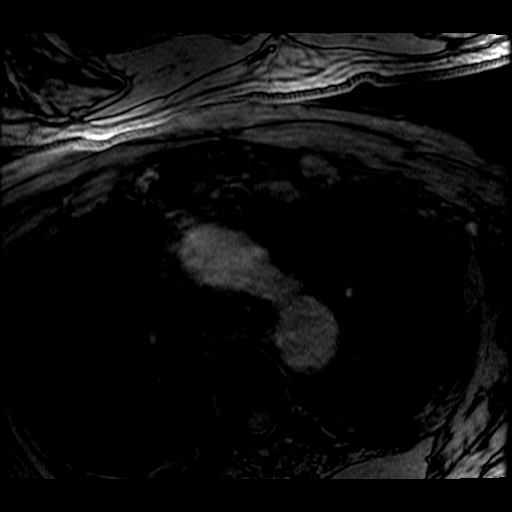
[im 26/152]
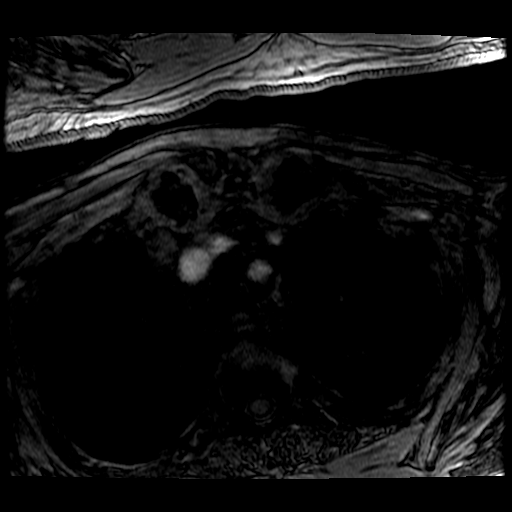
[im 29/152]
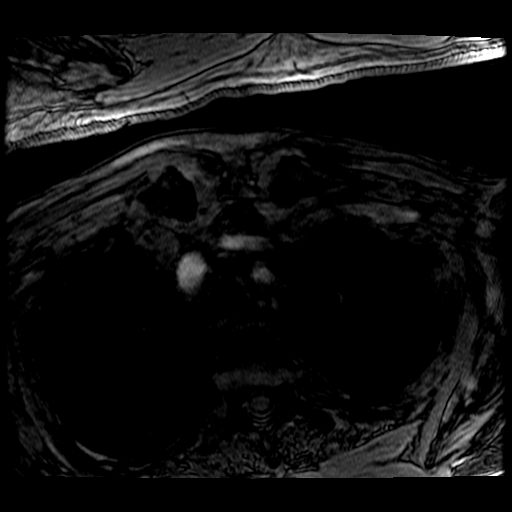
[im 47/152]
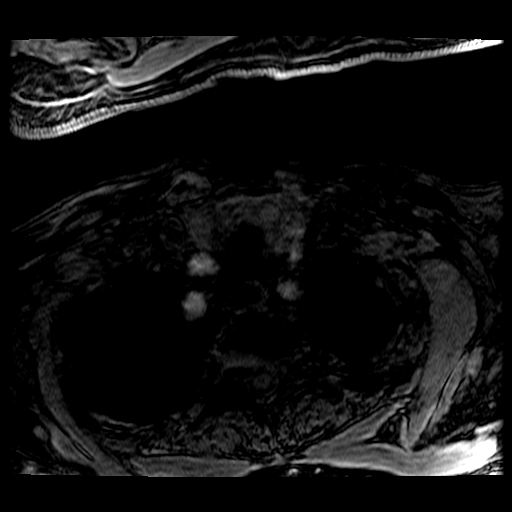
[im 65/152]
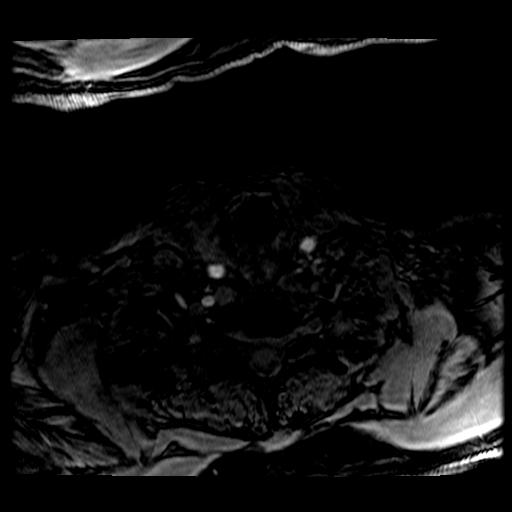
[im 76/152]
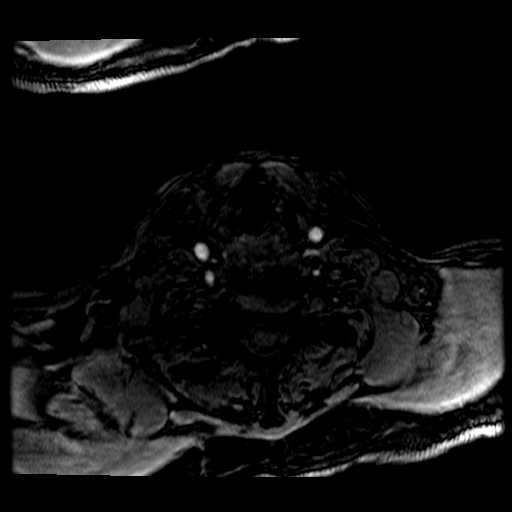
[im 87/152]
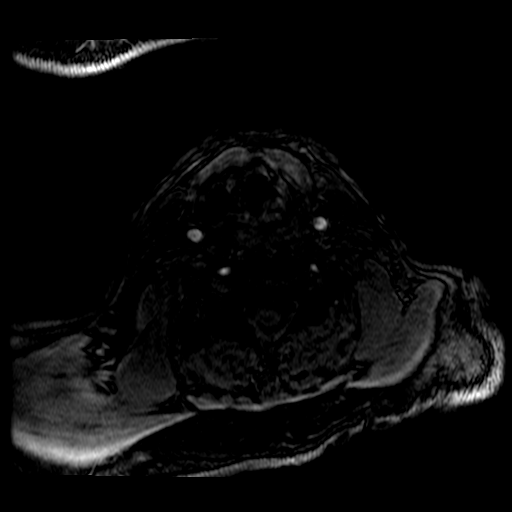
[im 105/152]
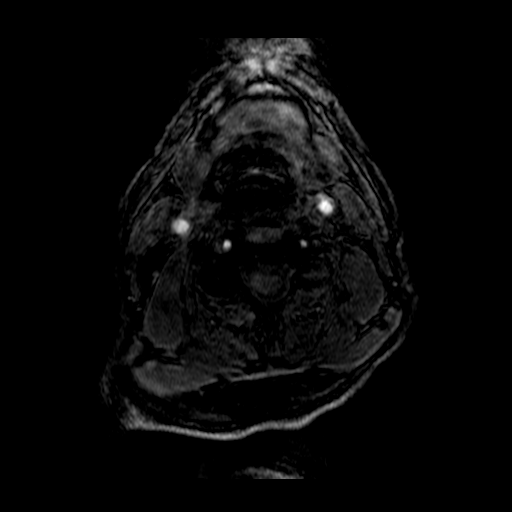
[im 123/152]
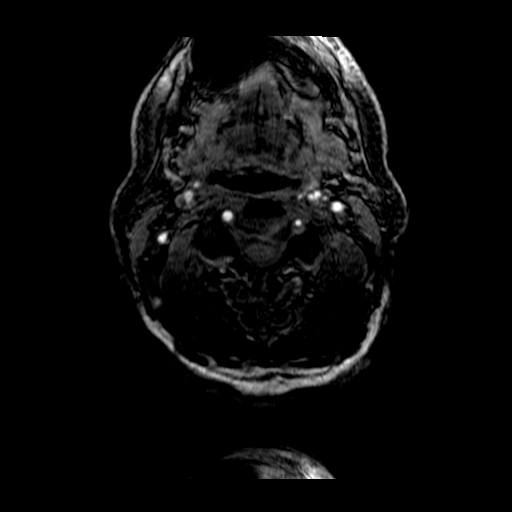
[im 126/152]
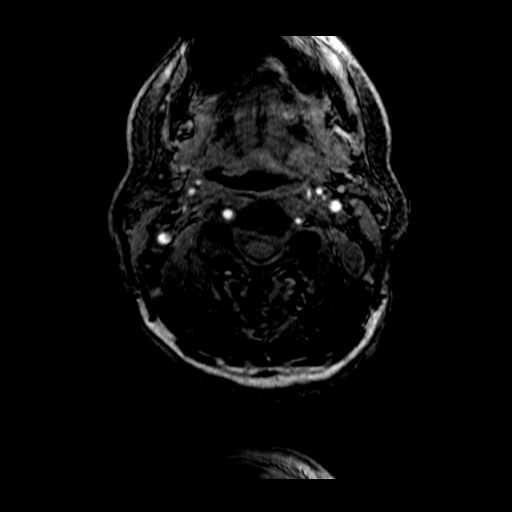
[im 144/152]
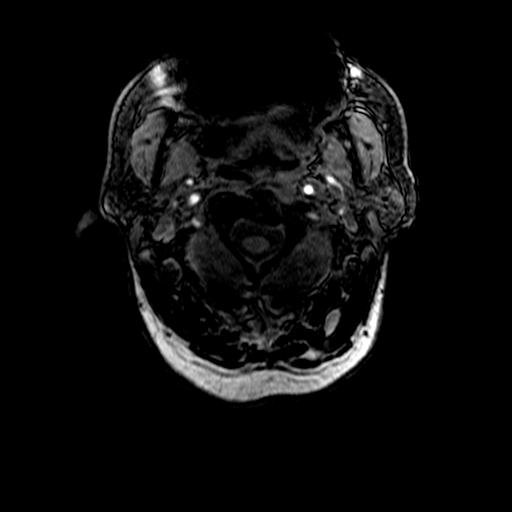

[Series 401: TOF · sagittal · 2.4mm · 0.47mm/px · 5 of 19 slices shown (2 of 2)]
[im 1/19]
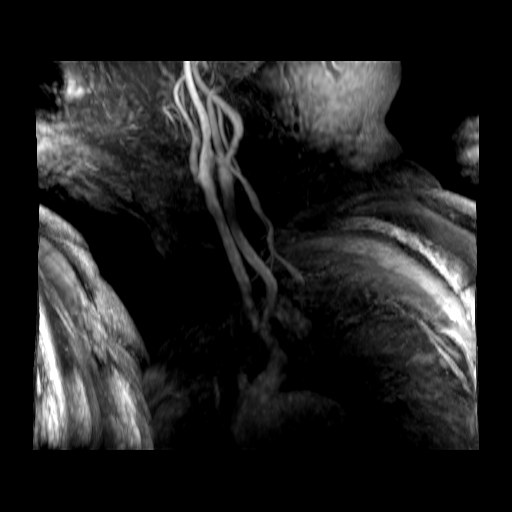
[im 5/19]
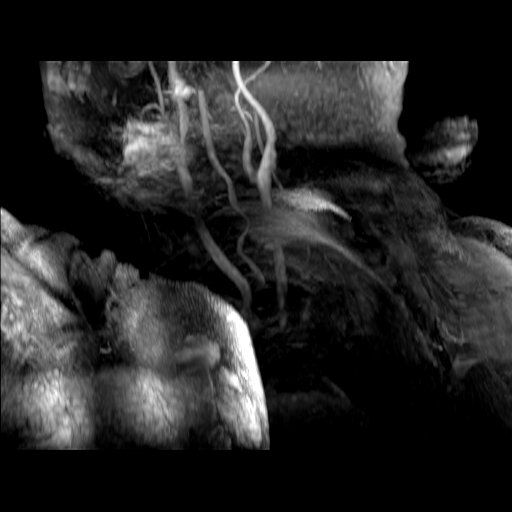
[im 10/19]
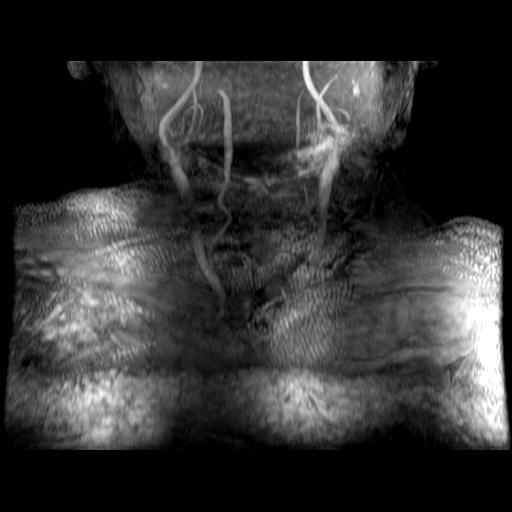
[im 14/19]
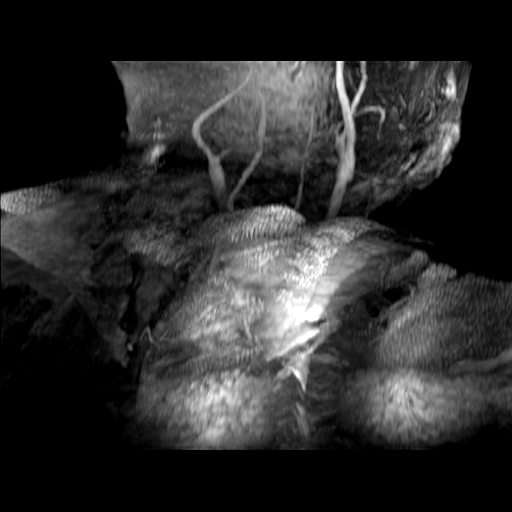
[im 19/19]
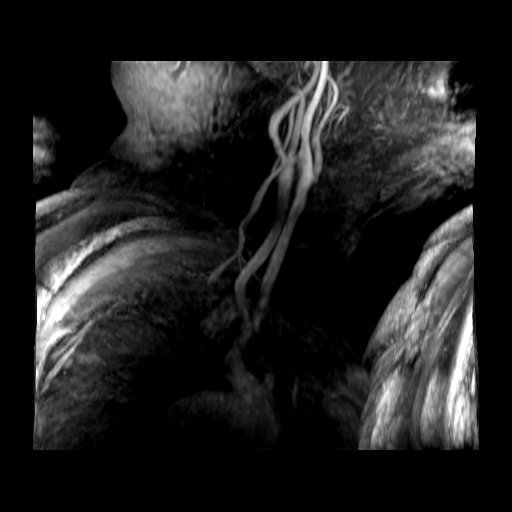

[17 of 48 positions shown; findings below may reference images not displayed]

FINDINGS: MRA HEAD FINDINGS

Anterior circulation: Visualized distal cervical segments of the
internal carotid arteries are widely patent with antegrade flow.
Petrous, cavernous, and supraclinoid segments widely patent without
stenosis. A1 segments patent bilaterally. Right A1 branches early.
Normal anterior communicating artery complex. Anterior cerebral
arteries widely patent to their distal aspects without stenosis. No
M1 stenosis or occlusion. Normal MCA bifurcations. Distal MCA
branches well perfused and symmetric.

Posterior circulation: Dominant right vertebral artery widely patent
to the vertebrobasilar junction. Left vertebral artery diffusely
hypoplastic but patent as well. Right PICA origin patent and normal.
Partially visualized left PICA patent. Basilar mildly tortuous but
is widely patent to its distal aspect without stenosis. Superior
cerebellar arteries patent bilaterally. Right PCA supplied via the
basilar. Left PCA supplied via the basilar as well as a robust left
posterior communicating artery. Both PCAs widely patent proximally.
Severe distal left P2 and right P3 stenoses noted (series 252, image
19). PCAs otherwise patent to their distal aspects.

Anatomic variants: Strongly dominant right vertebral artery with
hypoplastic left vertebral artery. Robust left posterior
communicating artery. No aneurysm.

MRA NECK FINDINGS

Aortic arch: Partially visualized aortic arch normal in caliber with
normal branch pattern. Atheromatous change noted within the
visualized distal arch. No hemodynamically significant stenosis seen
about the origin of the great vessels.

Right carotid system: Right CCA patent from its origin to the
bifurcation without stenosis. Atheromatous plaque about the proximal
right ICA with associated short-segment stenosis of up to 40-50% by
NASCET criteria (series 4, image 33). Right ICA otherwise patent
distally to the skull base.

Left carotid system: Left CCA patent from its origin to the
bifurcation without stenosis. Mild eccentric plaque about the
proximal left ICA with no more than mild 25% stenosis by NASCET
criteria. Left ICA patent distally to the skull base.

Vertebral arteries: Both vertebral arteries arise from the
subclavian arteries. No visible proximal subclavian artery stenosis.
Right vertebral artery dominant with a hypoplastic left vertebral
artery neither vertebral artery origin well visualized. Visualized
vertebral arteries widely patent without stenosis, evidence for
dissection, or occlusion.

Other: None
IMPRESSION: MRA HEAD:

1. Negative intracranial MRA for large vessel occlusion.
2. Severe distal left P2 and right P3 stenoses.
3. Otherwise wide patency of the major intracranial arterial
circulation. No proximal high-grade or correctable stenosis.

MRA NECK:

1. Short-segment moderate stenosis of up to 40-50% about the
proximal right ICA.
2. No significant stenosis involving the left carotid artery system.
3. Both vertebral arteries widely patent within the neck. Right
vertebral artery dominant.

## 2023-02-28 ENCOUNTER — Other Ambulatory Visit: Payer: Self-pay | Admitting: Internal Medicine

## 2023-02-28 ENCOUNTER — Other Ambulatory Visit: Payer: Self-pay | Admitting: Physician Assistant

## 2023-02-28 IMAGING — DX DG CHEST 1V PORT
1 series · 1 of 1 positions shown · non-contrast
Comparison: CT Abdomen and Pelvis [HOSPITAL]
07/27/2019.

CLINICAL DATA: 80-year-old male with right occipital lobe infarct.

EXAM:
PORTABLE CHEST 1 VIEW

[chest ap]
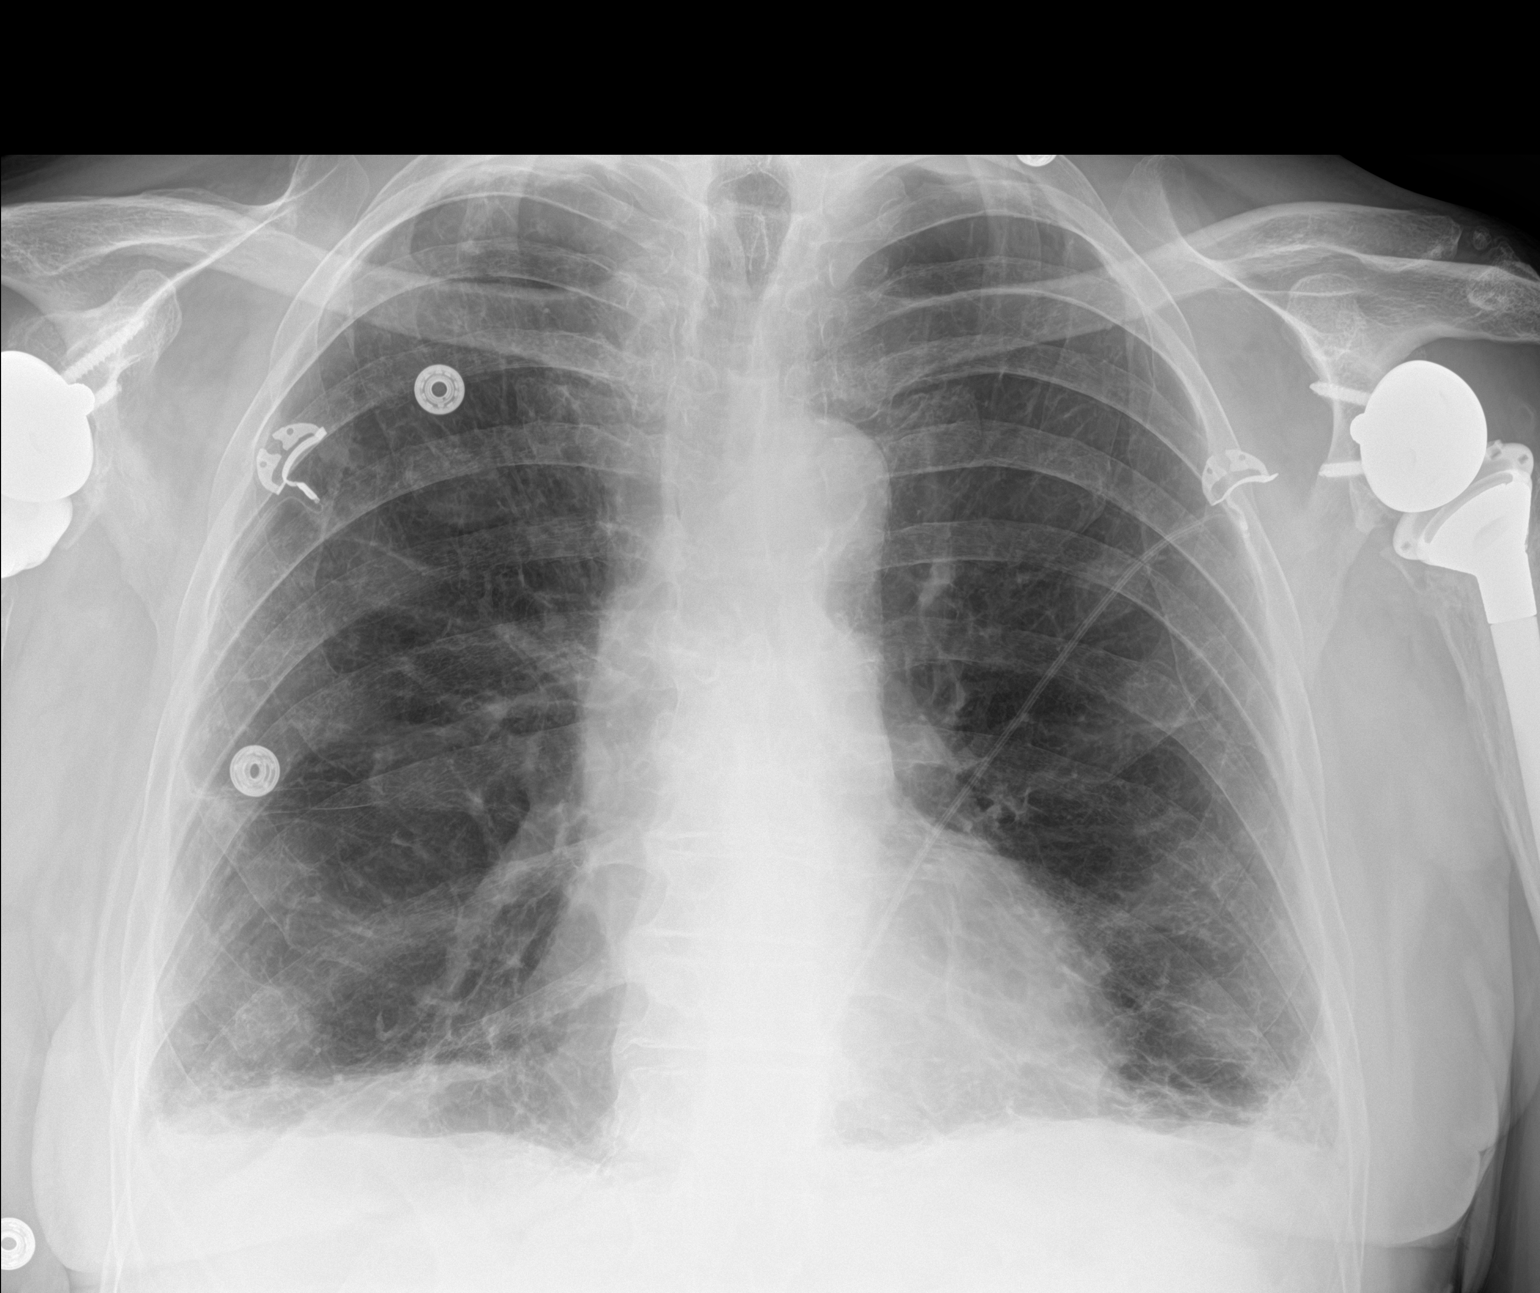

[1 of 1 positions shown; findings below may reference images not displayed]

FINDINGS: Portable AP semi upright view at 1443 hours. Pulmonary
hyperinflation. Coarse lung markings at the lung bases corresponding
to areas of paraseptal emphysema and subpleural scarring on the
prior CT. Bilateral nipple shadows. Mediastinal contours remain
within normal limits. Visualized tracheal air column is within
normal limits. No pneumothorax or pulmonary edema. No definite
pleural effusion. Bilateral shoulder arthroplasty. No acute osseous
abnormality identified.
IMPRESSION: Chronic lung disease with hyperinflation. No acute cardiopulmonary
abnormality.

## 2023-03-06 ENCOUNTER — Ambulatory Visit: Payer: Medicare Other

## 2023-03-06 VITALS — Ht 68.0 in | Wt 195.0 lb

## 2023-03-06 DIAGNOSIS — Z0000 Encounter for general adult medical examination without abnormal findings: Secondary | ICD-10-CM | POA: Diagnosis not present

## 2023-03-06 NOTE — Patient Instructions (Signed)
[name] , Thank you for taking time to come for your Medicare Wellness Visit. I appreciate your ongoing commitment to your health goals. Please review the following plan we discussed and let me know if I can assist you in the future.   Referrals/Orders/Follow-Ups/Clinician Recommendations: NO  This is a list of the screening recommended for you and due dates:  Health Maintenance  Topic Date Due   DTaP/Tdap/Td vaccine (2 - Tdap) 05/27/2022   Flu Shot  08/25/2023*   Medicare Annual Wellness Visit  03/05/2024   Colon Cancer Screening  06/27/2025   Pneumonia Vaccine  Completed   Zoster (Shingles) Vaccine  Completed   HPV Vaccine  Aged Out   COVID-19 Vaccine  Discontinued  *Topic was postponed. The date shown is not the original due date.    Advanced directives: (Declined) Advance directive discussed with you today. Even though you declined this today, please call our office should you change your mind, and we can give you the proper paperwork for you to fill out.  Next Medicare Annual Wellness Visit scheduled for next year: Yes

## 2023-03-06 NOTE — Progress Notes (Signed)
Subjective:   Kenneth Montoya is a 82 y.o. male who presents for Medicare Annual/Subsequent preventive examination.  Visit Complete: Virtual I connected with  [name] on 03/06/23 by a audio enabled telemedicine application and verified that I am speaking with the correct person using two identifiers.  Patient Location: Home  Provider Location: Office/Clinic  I discussed the limitations of evaluation and management by telemedicine. The patient expressed understanding and agreed to proceed.  Vital Signs: Because this visit was a virtual/telehealth visit, some criteria may be missing or patient reported. Any vitals not documented were not able to be obtained and vitals that have been documented are patient reported.  Cardiac Risk Factors include: advanced age (>71men, >4 women);dyslipidemia;family history of premature cardiovascular disease;hypertension;male gender     Objective:    Today's Vitals   03/06/23 1506  Weight: 195 lb (88.5 kg)  Height: 5\' 8"  (1.727 m)  PainSc: 0-No pain   Body mass index is 29.65 kg/m.     03/06/2023    3:09 PM 02/12/2023    2:23 PM 02/04/2023    2:33 PM 10/28/2022    1:43 PM 09/09/2022   12:32 PM 06/18/2022   10:21 AM 03/04/2022    3:22 PM  Advanced Directives  Does Patient Have a Medical Advance Directive? No Yes Yes Yes Yes Yes Yes  Type of Furniture conservator/restorer;Living will Healthcare Power of [place];Living will Healthcare Power of [place];Living will Healthcare Power of [place];Living will Healthcare Power of [place];Living will Healthcare Power of [place];Living will  Does patient want to make changes to medical advance directive?  No - Patient declined No - Patient declined  No - Patient declined    Copy of Healthcare Power of Attorney in Chart?  No - copy requested   No - copy requested  No - copy requested  Would patient like information on creating a medical advance directive? No - Patient declined           Current Medications (verified) Outpatient Encounter Medications as of 03/06/2023  Medication Sig   amLODipine (NORVASC) 10 MG tablet TAKE 1 TABLET BY MOUTH AT  BEDTIME   ascorbic acid (VITAMIN C) 1000 MG tablet Take 1,000 mg by mouth at bedtime.   atorvastatin (LIPITOR) 10 MG tablet TAKE 1 TABLET(10 MG) BY MOUTH DAILY   carvedilol (COREG) 6.25 MG tablet Take 1 tablet (6.25 mg total) by mouth 2 (two) times daily.   clopidogrel (PLAVIX) 75 MG tablet TAKE 1 TABLET(75 MG) BY MOUTH DAILY   cyclobenzaprine (FLEXERIL) 5 MG tablet Take 2 tablets (10 mg total) by mouth 3 (three) times daily as needed for muscle spasms.   DULoxetine (CYMBALTA) 30 MG capsule Take 1 capsule (30 mg total) by mouth daily.   fluticasone (FLONASE) 50 MCG/ACT nasal spray Place 2 sprays into both nostrils daily.   hydrochlorothiazide (MICROZIDE) 12.5 MG capsule TAKE 1 CAPSULE(12.5 MG) BY MOUTH DAILY   HYDROcodone-acetaminophen (NORCO/VICODIN) 5-325 MG tablet Take 1-2 tablets by mouth every 6 (six) hours as needed for severe pain.   loratadine (CLARITIN) 10 MG tablet Take 10 mg by mouth daily.   losartan (COZAAR) 100 MG tablet TAKE 1 TABLET BY MOUTH DAILY   olopatadine (PATANOL) 0.1 % ophthalmic solution Place 1 drop into both eyes at bedtime.   PREVIDENT 5000 PLUS 1.1 % CREA dental cream Place 1 Application onto teeth in the morning and at bedtime.   psyllium (METAMUCIL) 58.6 % powder Take 1 packet by mouth at  bedtime.   RABEprazole (ACIPHEX) 20 MG tablet Take 1 tablet (20 mg total) by mouth daily.   tamsulosin (FLOMAX) 0.4 MG CAPS capsule Take 0.4 mg by mouth at bedtime.   traZODone (DESYREL) 50 MG tablet Take 25 mg by mouth at bedtime.   No facility-administered encounter medications on file as of 03/06/2023.    Allergies (verified) Percocet [oxycodone-acetaminophen]   History: Past Medical History:  Diagnosis Date   Anxiety    Arthritis    generalized   BPH (benign prostatic hyperplasia)    Cancer of  skin, squamous cell    frozen    Cataract    bilateral sx   GERD (gastroesophageal reflux disease)    on meds   Hypertension    on meds   Shingles    SIADH (syndrome of inappropriate ADH production) (HCC)    Stroke [facility])    Past Surgical History:  Procedure Laterality Date   CARPAL TUNNEL RELEASE Left 09/09/2022   Procedure: LEFT CARPAL TUNNEL RELEASE;  Surgeon: Betha Loa, MD;  Location: [facility];  Service: Orthopedics;  Laterality: Left;  Bier block   CATARACT EXTRACTION, BILATERAL     COLONOSCOPY  06/27/2020   COLONOSCOPY  2018   EYE SURGERY     gum graft     INGUINAL HERNIA REPAIR     as infant   LUMBAR LAMINECTOMY  06/2015   POLYPECTOMY  2018   TA x 3   REVERSE TOTAL SHOULDER ARTHROPLASTY Bilateral    TONSILLECTOMY AND ADENOIDECTOMY     age 51    TOTAL HIP ARTHROPLASTY Right 02/12/2023   Procedure: RIGHT TOTAL HIP ARTHROPLASTY ANTERIOR APPROACH;  Surgeon: Ollen Gross, MD;  Location: [facility];  Service: Orthopedics;  Laterality: Right;   TOTAL KNEE ARTHROPLASTY Bilateral    TOTAL KNEE REVISION Right 12/25/2017   TRANSURETHRAL RESECTION OF PROSTATE     x 2   UPPER GASTROINTESTINAL ENDOSCOPY     Family History  Problem Relation Age of Onset   Colon polyps Mother 37   Stroke Maternal Grandfather    Colon cancer Neg Hx    Esophageal cancer Neg Hx    Rectal cancer Neg Hx    Stomach cancer Neg Hx    Social History   Socioeconomic History   Marital status: Married    Spouse name: Not on file   Number of children: Not on file   Years of education: Not on file   Highest education level: Not on file  Occupational History   Not on file  Tobacco Use   Smoking status: Never   Smokeless tobacco: Never  Vaping Use   Vaping status: Never Used  Substance and Sexual Activity   Alcohol use: Yes    Alcohol/week: 2.0 standard drinks of alcohol    Types: 2 Standard drinks or equivalent per week    Comment: wine 1-2 x a week and not every week    Drug  use: Never   Sexual activity: Not Currently  Other Topics Concern   Not on file  Social History Narrative   Left handed   Social Determinants of Health   Financial Resource Strain: Low Risk  (03/06/2023)   Overall Financial Resource Strain (CARDIA)    Difficulty of Paying Living Expenses: Not hard at all  Food Insecurity: No Food Insecurity (03/06/2023)   Hunger Vital Sign    Worried About Running Out of Food in the Last Year: Never true    Ran Out of Food in  the Last Year: Never true  Transportation Needs: No Transportation Needs (03/06/2023)   PRAPARE - Administrator, Civil Service (Medical): No    Lack of Transportation (Non-Medical): No  Physical Activity: Inactive (03/06/2023)   Exercise Vital Sign    Days of Exercise per Week: 0 days    Minutes of Exercise per Session: 0 min  Stress: No Stress Concern Present (03/06/2023)   [employer] of Occupational Health - Occupational Stress Questionnaire    Feeling of Stress : Not at all  Social Connections: Socially Integrated (03/06/2023)   Social Connection and Isolation Panel [NHANES]    Frequency of Communication with Friends and Family: More than three times a week    Frequency of Social Gatherings with Friends and Family: More than three times a week    Attends Religious Services: More than 4 times per year    Active Member of [employer] or Organizations: Yes    Attends Engineer, structural: More than 4 times per year    Marital Status: Married    Tobacco Counseling Counseling given: Not Answered   Clinical Intake:  Pre-visit preparation completed: Yes  Pain : No/denies pain Pain Score: 0-No pain Faces Pain Scale: No hurt  Faces Pain Scale: No hurt  BMI - recorded: 29.65 Nutritional Status: BMI 25 -29 Overweight Nutritional Risks: None Diabetes: No  How often do you need to have someone help you when you read instructions, pamphlets, or other written materials from your doctor or  pharmacy?: 1 - Never What is the last grade level you completed in school?: HSG  Interpreter Needed?: No  Information entered by :: Mohmmad Saleeby N. Cal Gindlesperger, LPN.   Activities of Daily Living    03/06/2023    3:12 PM 02/12/2023    2:23 PM  In your present state of health, do you have any difficulty performing the following activities:  Hearing? 0 0  Vision? 0 0  Difficulty concentrating or making decisions? 0 0  Walking or climbing stairs? 0 1  Dressing or bathing? 0 0  Doing errands, shopping? 0 0  Preparing Food and eating ? N   Using the Toilet? N   In the past six months, have you accidently leaked urine? N   Do you have problems with loss of bowel control? N   Managing your Medications? N   Managing your Finances? N   Housekeeping or managing your Housekeeping? N     Patient Care Team: Etta Grandchild, MD as PCP - General (Internal Medicine) Jens Som Madolyn Frieze, MD as PCP - Cardiology (Cardiology) [place], Heather, OD as Consulting Physician (Optometry)  Indicate any recent Medical Services you may have received from other than [facility] providers in the past year (date may be approximate).     Assessment:   This is a routine wellness examination for Yiannis.  Hearing/Vision screen Hearing Screening - Comments:: Denies hearing difficulties   Vision Screening - Comments:: Wears rx glasses - up to date with routine eye exams with [facility]    Goals Addressed             This Visit's Progress    Get back into the gym.        Depression Screen    03/06/2023    3:11 PM 04/29/2022    4:05 PM 04/29/2022    4:00 PM 03/04/2022    3:21 PM 01/25/2022    3:23 PM 07/16/2021    9:45 AM 05/14/2021    9:45  AM  PHQ 2/9 Scores  PHQ - 2 Score 0 0 0 0 0 0 0  PHQ- 9 Score 3 0 0 0 0      Fall Risk    03/06/2023    3:10 PM 10/28/2022    1:43 PM 06/18/2022   10:21 AM 04/29/2022    4:05 PM 04/29/2022    4:00 PM  Fall Risk   Falls in the past year? 1 0 0 0 0  Number falls in past  yr: 0 0 0 0 0  Injury with Fall? 0 0 0 0 0  Risk for fall due to :    History of fall(s) No Fall Risks  Follow up Falls evaluation completed;Education provided Falls evaluation completed Falls evaluation completed Falls evaluation completed Falls evaluation completed    MEDICARE RISK AT HOME: Medicare Risk at Home Any stairs in or around the home?: No If so, are there any without handrails?: No Home free of loose throw rugs in walkways, pet beds, electrical cords, etc?: Yes Adequate lighting in your home to reduce risk of falls?: Yes Life alert?: No Use of a cane, walker or w/c?: Yes Grab bars in the bathroom?: Yes Shower chair or bench in shower?: Yes Elevated toilet seat or a handicapped toilet?: Yes  TIMED UP AND GO:  Was the test performed?  No    Cognitive Function:    03/06/2023    3:18 PM  MMSE - Mini Mental State Exam  Not completed: Unable to complete        03/06/2023    3:11 PM 03/04/2022    3:35 PM  6CIT Screen  What Year? 0 points 0 points  What month? 0 points 0 points  What time? 0 points 0 points  Count back from 20 0 points 0 points  Months in reverse 0 points 0 points  Repeat phrase 0 points 0 points  Total Score 0 points 0 points    Immunizations Immunization History  Administered Date(s) Administered   Fluad Quad(high Dose 65+) 02/28/2022   Influenza,inj,quad, With Preservative 03/29/2017, 03/31/2018, 03/08/2019   Influenza-Unspecified 02/26/2021   PFIZER(Purple Top)SARS-COV-2 Vaccination 06/17/2019, 06/28/2019, 02/29/2020, 10/29/2020, 02/20/2021   PNEUMOCOCCAL CONJUGATE-20 11/13/2020   Pfizer Covid-19 Vaccine Bivalent Booster 53yrs & up 10/02/2021, 02/28/2022   Td 05/27/2012   Td (Adult) 05/27/2012   Zoster Recombinant(Shingrix) 10/29/2020, 12/29/2020   Zoster, Live 02/25/2015    TDAP status: Due, Education has been provided regarding the importance of this vaccine. Advised may receive this vaccine at local pharmacy or Health Dept. Aware  to provide a copy of the vaccination record if obtained from local pharmacy or Health Dept. Verbalized acceptance and understanding.  Flu Vaccine status: Due, Education has been provided regarding the importance of this vaccine. Advised may receive this vaccine at local pharmacy or Health Dept. Aware to provide a copy of the vaccination record if obtained from local pharmacy or Health Dept. Verbalized acceptance and understanding.  Pneumococcal vaccine status: Up to date  Covid-19 vaccine status: Information provided on how to obtain vaccines.   Qualifies for Shingles Vaccine? Yes   Zostavax completed Yes   Shingrix Completed?: Yes  Screening Tests Health Maintenance  Topic Date Due   DTaP/Tdap/Td (2 - Tdap) 05/27/2022   INFLUENZA VACCINE  08/25/2023 (Originally 12/26/2022)   Medicare Annual Wellness (AWV)  03/05/2024   Colonoscopy  06/27/2025   Pneumonia Vaccine 55+ Years old  Completed   Zoster Vaccines- Shingrix  Completed   HPV VACCINES  Aged Out  COVID-19 Vaccine  Discontinued    Health Maintenance  Health Maintenance Due  Topic Date Due   DTaP/Tdap/Td (2 - Tdap) 05/27/2022    Colorectal cancer screening: No longer required.   Lung Cancer Screening: (Low Dose CT Chest recommended if Age 57-80 years, 20 pack-year currently smoking OR have quit w/in 15years.) does not qualify.   Lung Cancer Screening Referral: no  Additional Screening:  Hepatitis C Screening: does not qualify; Completed: no  Vision Screening: Recommended annual ophthalmology exams for early detection of glaucoma and other disorders of the eye. Is the patient up to date with their annual eye exam?  Yes  Who is the provider or what is the name of the office in which the patient attends annual eye exams? Heather [place], OD. If pt is not established with a provider, would they like to be referred to a provider to establish care? No .   Dental Screening: Recommended annual dental exams for proper oral  hygiene  Diabetic Foot Exam: N/A  Community Resource Referral / Chronic Care Management: CRR required this visit?  No   CCM required this visit?  No     Plan:     I have personally reviewed and noted the following in the patient's chart:   Medical and social history Use of alcohol, tobacco or illicit drugs  Current medications and supplements including opioid prescriptions. Patient is currently taking opioid prescriptions. Information provided to patient regarding non-opioid alternatives. Patient advised to discuss non-opioid treatment plan with their provider. Functional ability and status Nutritional status Physical activity Advanced directives List of other physicians Hospitalizations, surgeries, and ER visits in previous 12 months Vitals Screenings to include cognitive, depression, and falls Referrals and appointments  In addition, I have reviewed and discussed with patient certain preventive protocols, quality metrics, and best practice recommendations. A written personalized care plan for preventive services as well as general preventive health recommendations were provided to patient.     Mickeal Needy, LPN   [id number]   After Visit Summary: (MyChart) Due to this being a telephonic visit, the after visit summary with patients personalized plan was offered to patient via MyChart   Nurse Notes: Normal cognitive status assessed by direct observation via telephone conversation by this Nurse Health Advisor. No abnormalities found.

## 2023-03-10 ENCOUNTER — Other Ambulatory Visit: Payer: Self-pay

## 2023-03-10 ENCOUNTER — Encounter (HOSPITAL_COMMUNITY): Payer: Self-pay | Admitting: Emergency Medicine

## 2023-03-10 ENCOUNTER — Inpatient Hospital Stay (HOSPITAL_COMMUNITY)
Admission: EM | Admit: 2023-03-10 | Discharge: 2023-03-18 | DRG: 876 | Disposition: A | Discharge status: Skilled Nursing Facility | Payer: Medicare Other | Attending: Family Medicine | Admitting: Family Medicine

## 2023-03-10 DIAGNOSIS — Z83719 Family history of colon polyps, unspecified: Secondary | ICD-10-CM

## 2023-03-10 DIAGNOSIS — E871 Hypo-osmolality and hyponatremia: Secondary | ICD-10-CM | POA: Diagnosis present

## 2023-03-10 DIAGNOSIS — F410 Panic disorder [episodic paroxysmal anxiety] without agoraphobia: Secondary | ICD-10-CM | POA: Diagnosis not present

## 2023-03-10 DIAGNOSIS — E669 Obesity, unspecified: Secondary | ICD-10-CM | POA: Diagnosis present

## 2023-03-10 DIAGNOSIS — Z96653 Presence of artificial knee joint, bilateral: Secondary | ICD-10-CM | POA: Diagnosis present

## 2023-03-10 DIAGNOSIS — M9701XA Periprosthetic fracture around internal prosthetic right hip joint, initial encounter: Secondary | ICD-10-CM | POA: Diagnosis present

## 2023-03-10 DIAGNOSIS — Z823 Family history of stroke: Secondary | ICD-10-CM

## 2023-03-10 DIAGNOSIS — R45851 Suicidal ideations: Secondary | ICD-10-CM | POA: Diagnosis present

## 2023-03-10 DIAGNOSIS — Z7902 Long term (current) use of antithrombotics/antiplatelets: Secondary | ICD-10-CM

## 2023-03-10 DIAGNOSIS — N400 Enlarged prostate without lower urinary tract symptoms: Secondary | ICD-10-CM | POA: Diagnosis present

## 2023-03-10 DIAGNOSIS — L0390 Cellulitis, unspecified: Secondary | ICD-10-CM | POA: Diagnosis present

## 2023-03-10 DIAGNOSIS — Z96612 Presence of left artificial shoulder joint: Secondary | ICD-10-CM | POA: Diagnosis present

## 2023-03-10 DIAGNOSIS — S72001A Fracture of unspecified part of neck of right femur, initial encounter for closed fracture: Secondary | ICD-10-CM | POA: Diagnosis present

## 2023-03-10 DIAGNOSIS — D649 Anemia, unspecified: Secondary | ICD-10-CM | POA: Diagnosis present

## 2023-03-10 DIAGNOSIS — Y831 Surgical operation with implant of artificial internal device as the cause of abnormal reaction of the patient, or of later complication, without mention of misadventure at the time of the procedure: Secondary | ICD-10-CM | POA: Diagnosis present

## 2023-03-10 DIAGNOSIS — Z885 Allergy status to narcotic agent status: Secondary | ICD-10-CM

## 2023-03-10 DIAGNOSIS — I2510 Atherosclerotic heart disease of native coronary artery without angina pectoris: Secondary | ICD-10-CM | POA: Diagnosis present

## 2023-03-10 DIAGNOSIS — B9561 Methicillin susceptible Staphylococcus aureus infection as the cause of diseases classified elsewhere: Secondary | ICD-10-CM

## 2023-03-10 DIAGNOSIS — R0902 Hypoxemia: Secondary | ICD-10-CM | POA: Diagnosis present

## 2023-03-10 DIAGNOSIS — M25551 Pain in right hip: Secondary | ICD-10-CM

## 2023-03-10 DIAGNOSIS — I110 Hypertensive heart disease with heart failure: Secondary | ICD-10-CM | POA: Diagnosis present

## 2023-03-10 DIAGNOSIS — M1611 Unilateral primary osteoarthritis, right hip: Secondary | ICD-10-CM | POA: Diagnosis present

## 2023-03-10 DIAGNOSIS — G9340 Encephalopathy, unspecified: Secondary | ICD-10-CM | POA: Diagnosis present

## 2023-03-10 DIAGNOSIS — Z6831 Body mass index (BMI) 31.0-31.9, adult: Secondary | ICD-10-CM

## 2023-03-10 DIAGNOSIS — E861 Hypovolemia: Secondary | ICD-10-CM | POA: Diagnosis present

## 2023-03-10 DIAGNOSIS — Z8673 Personal history of transient ischemic attack (TIA), and cerebral infarction without residual deficits: Secondary | ICD-10-CM

## 2023-03-10 DIAGNOSIS — K219 Gastro-esophageal reflux disease without esophagitis: Secondary | ICD-10-CM | POA: Diagnosis present

## 2023-03-10 DIAGNOSIS — L03115 Cellulitis of right lower limb: Principal | ICD-10-CM

## 2023-03-10 DIAGNOSIS — I10 Essential (primary) hypertension: Secondary | ICD-10-CM | POA: Diagnosis present

## 2023-03-10 DIAGNOSIS — E222 Syndrome of inappropriate secretion of antidiuretic hormone: Secondary | ICD-10-CM | POA: Diagnosis present

## 2023-03-10 DIAGNOSIS — G9341 Metabolic encephalopathy: Principal | ICD-10-CM | POA: Diagnosis present

## 2023-03-10 DIAGNOSIS — S32401A Unspecified fracture of right acetabulum, initial encounter for closed fracture: Secondary | ICD-10-CM | POA: Diagnosis present

## 2023-03-10 DIAGNOSIS — T8451XA Infection and inflammatory reaction due to internal right hip prosthesis, initial encounter: Secondary | ICD-10-CM | POA: Diagnosis present

## 2023-03-10 DIAGNOSIS — M7989 Other specified soft tissue disorders: Secondary | ICD-10-CM | POA: Diagnosis present

## 2023-03-10 DIAGNOSIS — Z96611 Presence of right artificial shoulder joint: Secondary | ICD-10-CM | POA: Diagnosis present

## 2023-03-10 DIAGNOSIS — T8141XA Infection following a procedure, superficial incisional surgical site, initial encounter: Secondary | ICD-10-CM | POA: Diagnosis not present

## 2023-03-10 DIAGNOSIS — W19XXXA Unspecified fall, initial encounter: Secondary | ICD-10-CM | POA: Diagnosis not present

## 2023-03-10 DIAGNOSIS — I739 Peripheral vascular disease, unspecified: Secondary | ICD-10-CM | POA: Diagnosis present

## 2023-03-10 DIAGNOSIS — Z79899 Other long term (current) drug therapy: Secondary | ICD-10-CM

## 2023-03-10 DIAGNOSIS — R7881 Bacteremia: Secondary | ICD-10-CM | POA: Diagnosis present

## 2023-03-10 DIAGNOSIS — T8149XA Infection following a procedure, other surgical site, initial encounter: Secondary | ICD-10-CM

## 2023-03-10 DIAGNOSIS — I679 Cerebrovascular disease, unspecified: Secondary | ICD-10-CM | POA: Diagnosis present

## 2023-03-10 DIAGNOSIS — Z9981 Dependence on supplemental oxygen: Secondary | ICD-10-CM

## 2023-03-10 DIAGNOSIS — N179 Acute kidney failure, unspecified: Secondary | ICD-10-CM | POA: Diagnosis present

## 2023-03-10 DIAGNOSIS — I5032 Chronic diastolic (congestive) heart failure: Secondary | ICD-10-CM | POA: Diagnosis present

## 2023-03-10 DIAGNOSIS — E785 Hyperlipidemia, unspecified: Secondary | ICD-10-CM | POA: Diagnosis present

## 2023-03-10 DIAGNOSIS — T8130XA Disruption of wound, unspecified, initial encounter: Secondary | ICD-10-CM | POA: Diagnosis present

## 2023-03-10 DIAGNOSIS — M810 Age-related osteoporosis without current pathological fracture: Secondary | ICD-10-CM | POA: Diagnosis present

## 2023-03-10 DIAGNOSIS — N320 Bladder-neck obstruction: Secondary | ICD-10-CM | POA: Diagnosis present

## 2023-03-10 DIAGNOSIS — F4320 Adjustment disorder, unspecified: Secondary | ICD-10-CM | POA: Diagnosis present

## 2023-03-10 LAB — CBC WITH DIFFERENTIAL/PLATELET
Abs Immature Granulocytes: 0.41 10*3/uL — ABNORMAL HIGH (ref 0.00–0.07)
Basophils Absolute: 0.1 10*3/uL (ref 0.0–0.1)
Basophils Relative: 1 %
Eosinophils Absolute: 0 10*3/uL (ref 0.0–0.5)
Eosinophils Relative: 0 %
HCT: 31.5 % — ABNORMAL LOW (ref 39.0–52.0)
Hemoglobin: 10.3 g/dL — ABNORMAL LOW (ref 13.0–17.0)
Immature Granulocytes: 2 %
Lymphocytes Relative: 3 %
Lymphs Abs: 0.6 10*3/uL — ABNORMAL LOW (ref 0.7–4.0)
MCH: 30.9 pg (ref 26.0–34.0)
MCHC: 32.7 g/dL (ref 30.0–36.0)
MCV: 94.6 fL (ref 80.0–100.0)
Monocytes Absolute: 0.8 10*3/uL (ref 0.1–1.0)
Monocytes Relative: 4 %
Neutro Abs: 15.5 10*3/uL — ABNORMAL HIGH (ref 1.7–7.7)
Neutrophils Relative %: 90 %
Platelets: 258 10*3/uL (ref 150–400)
RBC: 3.33 MIL/uL — ABNORMAL LOW (ref 4.22–5.81)
RDW: 12.9 % (ref 11.5–15.5)
WBC: 17.3 10*3/uL — ABNORMAL HIGH (ref 4.0–10.5)
nRBC: 0 % (ref 0.0–0.2)

## 2023-03-10 MED ORDER — LORAZEPAM 2 MG/ML IJ SOLN
1.0000 mg | Freq: Once | INTRAMUSCULAR | Status: AC
  Administered 2023-03-10: 1 mg via INTRAVENOUS
  Filled 2023-03-10: qty 1

## 2023-03-10 MED ORDER — VANCOMYCIN HCL IN DEXTROSE 1-5 GM/200ML-% IV SOLN
1000.0000 mg | Freq: Once | INTRAVENOUS | Status: AC
  Administered 2023-03-10: 1000 mg via INTRAVENOUS
  Filled 2023-03-10: qty 200

## 2023-03-10 NOTE — ED Provider Notes (Cosign Needed Addendum)
ADDENDUM -- EKG for incorrect patient uploaded to this patient's chart attached to this hospital encounter. IT contacted and service ticket placed to remove this. Be advised uploaded media from 03/11/23  0021 NOT for this patient.    [place] EMERGENCY DEPARTMENT AT [facility] Provider Note   CSN: [id number] Arrival date & time: 03/10/23  2154     History  Chief Complaint  Patient presents with   Hip Pain    Kenneth Montoya is a 82 y.o. male with history of HTN, GERD, GAD, SIADH, TIA, recent R hip replacement with Dr Lequita Halt who presents to the emergency department for several complaints.  Apparently patient had originally called EMS with concern for a panic attack.  He has been experiencing severe anxiety and panic attacks, especially at night.  He is not having any relief with trazodone.  He made comments that he does not want to keep living like this.  Denied any plan to take his life.  Patient also complaining of right hip pain, he had a hip replacement on 9/18.  He states that he had a stitch "pop" and he has been having drainage from the site.  Denies any recent falls.  He normally takes Aleve for the pain and it was relieving it until now.  He has not had Aleve since earlier in the morning.  Denies any fevers or chills.   Hip Pain       Home Medications Prior to Admission medications   Medication Sig Start Date End Date Taking? Authorizing Provider  amLODipine (NORVASC) 10 MG tablet TAKE 1 TABLET BY MOUTH AT  BEDTIME 06/29/22   Etta Grandchild, MD  ascorbic acid (VITAMIN C) 1000 MG tablet Take 1,000 mg by mouth at bedtime.    [provider]  atorvastatin (LIPITOR) 10 MG tablet TAKE 1 TABLET(10 MG) BY MOUTH DAILY 02/28/23   Etta Grandchild, MD  carvedilol (COREG) 6.25 MG tablet Take 1 tablet (6.25 mg total) by mouth 2 (two) times daily. 12/31/22   Lewayne Bunting, MD  clopidogrel (PLAVIX) 75 MG tablet TAKE 1 TABLET(75 MG) BY MOUTH DAILY 02/14/23   Etta Grandchild, MD  cyclobenzaprine (FLEXERIL) 5 MG tablet Take 2 tablets (10 mg total) by mouth 3 (three) times daily as needed for muscle spasms. 02/13/23   Eartha Inch, PA  DULoxetine (CYMBALTA) 30 MG capsule Take 1 capsule (30 mg total) by mouth daily. 11/22/22   Everlena Cooper, Adam R, DO  fluticasone (FLONASE) 50 MCG/ACT nasal spray Place 2 sprays into both nostrils daily. 02/07/22   Etta Grandchild, MD  hydrochlorothiazide (MICROZIDE) 12.5 MG capsule TAKE 1 CAPSULE(12.5 MG) BY MOUTH DAILY 02/28/23   Sharlene Dory, PA-C  HYDROcodone-acetaminophen (NORCO/VICODIN) 5-325 MG tablet Take 1-2 tablets by mouth every 6 (six) hours as needed for severe pain. 02/13/23   Eartha Inch, PA  loratadine (CLARITIN) 10 MG tablet Take 10 mg by mouth daily.    [provider]  losartan (COZAAR) 100 MG tablet TAKE 1 TABLET BY MOUTH DAILY 02/11/23   Etta Grandchild, MD  olopatadine (PATANOL) 0.1 % ophthalmic solution Place 1 drop into both eyes at bedtime.    [provider]  PREVIDENT 5000 PLUS 1.1 % CREA dental cream Place 1 Application onto teeth in the morning and at bedtime. 03/31/21   [provider]  psyllium (METAMUCIL) 58.6 % powder Take 1 packet by mouth at bedtime.    [provider]  RABEprazole (ACIPHEX) 20 MG  tablet Take 1 tablet (20 mg total) by mouth daily. 10/23/22   Etta Grandchild, MD  tamsulosin (FLOMAX) 0.4 MG CAPS capsule Take 0.4 mg by mouth at bedtime. 09/17/19   [provider]  traZODone (DESYREL) 50 MG tablet Take 25 mg by mouth at bedtime. 05/08/22   [provider]      Allergies    Percocet [oxycodone-acetaminophen]    Review of Systems   Review of Systems  Constitutional:  Negative for fever.  Skin:  Positive for wound.  Psychiatric/Behavioral:  Positive for suicidal ideas. The patient is nervous/anxious.   All other systems reviewed and are negative.   Physical Exam Updated Vital Signs BP 116/61   Pulse 96   Temp 98.2 F (36.8  C)   Resp (!) 33   SpO2 100%  Physical Exam Vitals and nursing note reviewed.  Constitutional:      Appearance: Normal appearance.  HENT:     Head: Normocephalic and atraumatic.  Eyes:     Conjunctiva/sclera: Conjunctivae normal.  Cardiovascular:     Rate and Rhythm: Normal rate and regular rhythm.  Pulmonary:     Effort: Pulmonary effort is normal. No respiratory distress.     Breath sounds: Normal breath sounds.  Abdominal:     General: There is no distension.     Palpations: Abdomen is soft.     Tenderness: There is no abdominal tenderness.  Skin:    General: Skin is warm and dry.     Comments: Surgical scar with wound dehiscence, copious foul smelling serous drainage with surrounding erythema extending to the buttocks and generalized edema  Neurological:     General: No focal deficit present.     Mental Status: He is alert.  Psychiatric:        Attention and Perception: Attention and perception normal.        Mood and Affect: Mood is anxious.        Speech: Speech normal.        Behavior: Behavior normal.        Thought Content: Thought content includes suicidal ideation. Thought content does not include homicidal ideation. Thought content does not include homicidal or suicidal plan.      ED Results / Procedures / Treatments   Labs (all labs ordered are listed, but only abnormal results are displayed) Labs Reviewed  CBC WITH DIFFERENTIAL/PLATELET - Abnormal; Notable for the following components:      Result Value   WBC 17.3 (*)    RBC 3.33 (*)    Hemoglobin 10.3 (*)    HCT 31.5 (*)    Neutro Abs 15.5 (*)    Lymphs Abs 0.6 (*)    Abs Immature Granulocytes 0.41 (*)    All other components within normal limits  COMPREHENSIVE METABOLIC PANEL - Abnormal; Notable for the following components:   Sodium 124 (*)    Chloride 90 (*)    Glucose, Bld 116 (*)    Calcium 8.7 (*)    Total Bilirubin 1.8 (*)    All other components within normal limits  SALICYLATE LEVEL -  Abnormal; Notable for the following components:   Salicylate Lvl <7.0 (*)    All other components within normal limits  ACETAMINOPHEN LEVEL - Abnormal; Notable for the following components:   Acetaminophen (Tylenol), Serum <10 (*)    All other components within normal limits  ETHANOL    EKG EKG Interpretation Date/Time:  Tuesday March 11 2023 00:59:05 EDT Ventricular Rate:  128  PR Interval:  150 QRS Duration:  120 QT Interval:  336 QTC Calculation: 490 R Axis:   -55  Text Interpretation: Undetermined rhythm Right bundle branch block Left anterior fascicular block Bifascicular block Abnormal ECG Confirmed by Zadie Rhine ([id number]) on 03/11/2023 1:08:02 AM   Radiology DG Chest Portable 1 View  Result Date: 03/11/2023 CLINICAL DATA:  Shortness of breath, sneezing EXAM: PORTABLE CHEST 1 VIEW COMPARISON:  12/10/2020 FINDINGS: Linear bibasilar scarring or atelectasis. Heart and mediastinal contours within normal limits. Aortic atherosclerosis. No effusions. No acute bony abnormality. Bilateral shoulder replacements. IMPRESSION: Bibasilar scarring or atelectasis. Electronically Signed   By: Charlett Nose M.D.   On: 03/11/2023 02:16   CT HIP RIGHT W CONTRAST  Result Date: 03/11/2023 CLINICAL DATA:  Soft tissue infection suspected, hip, xray done R hip cellulitis with recent hip replacement EXAM: CT OF THE LOWER RIGHT EXTREMITY WITH CONTRAST TECHNIQUE: Multidetector CT imaging of the lower right extremity was performed according to the standard protocol following intravenous contrast administration. RADIATION DOSE REDUCTION: This exam was performed according to the departmental dose-optimization program which includes automated exposure control, adjustment of the mA and/or kV according to patient size and/or use of iterative reconstruction technique. CONTRAST:  OMNIPAQUE IOHEXOL 300 MG/ML  SOLN COMPARISON:  Plain films 02/12/2023 FINDINGS: Bones/Joint/Cartilage Changes of right hip  replacement. There is a fracture extending superiorly from the medial superior acetabulum into the right iliac bone posteriorly. This is nondisplaced. Ligaments Suboptimally assessed by CT. Muscles and Tendons Grossly unremarkable fluid collection within the superior quadriceps muscle laterally, measuring approximately 6 x 4 cm on image 48 of series 10. This could reflect postoperative intramuscular hematoma or abscess. Soft tissues Subcutaneous edema. IMPRESSION: Changes of right hip replacement. There is a nondisplaced fracture extending from the superior posteromedial acetabulum superiorly into the posterior iliac bone. Large fluid collection laterally within the upper right quadriceps muscle. This could reflect a postoperative intramuscular hematoma or abscess. Electronically Signed   By: Charlett Nose M.D.   On: 03/11/2023 02:16    Procedures Procedures    Medications Ordered in ED Medications  fentaNYL (SUBLIMAZE) injection 25 mcg (has no administration in time range)  vancomycin (VANCOCIN) IVPB 1000 mg/200 mL premix (1,000 mg Intravenous New Bag/Given 03/10/23 2351)  LORazepam (ATIVAN) injection 1 mg (1 mg Intravenous Given 03/10/23 2351)  naproxen (NAPROSYN) tablet 500 mg (500 mg Oral Given 03/11/23 0027)  iohexol (OMNIPAQUE) 300 MG/ML solution 100 mL (100 mLs Intravenous Contrast Given 03/11/23 0010)    ED Course/ Medical Decision Making/ A&P Clinical Course as of 03/11/23 0349  Tue Mar 11, 2023  0100 Pt had episode of hyperventilation and feeling very anxious. I was called to bedside as patient was also hypoxic to 87%, placed on 4L [place]. Upon my evaluation patient appeared very uncomfortable. Said he needed to "put his feet on the floor". Staff had difficulty getting EKG due to patient movement. Patient was moved out of hallway into room and calmed down. Vitals improved.  Chest x-ray and pain medication ordered. Plan to reevaluate once more comfortable. [LR]  0307 Consulted with orthopedist  Dr Ranell Patrick [LR]    Clinical Course User Index [LR] Ivet Guerrieri, Lora Paula, PA-C                                 Medical Decision Making Amount and/or Complexity of Data Reviewed Labs: ordered. Radiology: ordered.  Risk Prescription drug management. Decision regarding hospitalization.  This patient is a 82 y.o. male  who presents to the ED for concern of anxiety and r hip pain.   Differential diagnoses prior to evaluation: The emergent differential diagnosis includes, but is not limited to,  post operative complication (pain, infection), sepsis. This is not an exhaustive differential.   Past Medical History / Co-morbidities / Social History: HTN, GERD, GAD, SIADH, TIA, recent R hip replacement with Dr Lequita Halt on 9/18  Additional history: Chart reviewed. Pertinent results include: Reviewed orthopedic surgery records from recent hip replacement  Physical Exam: Physical exam performed. The pertinent findings include: pt appears anxious, endorses significant anxiety and passive SI without a plan. Surgical site on R hip with wound dehiscence and surrounding cellulitis, drainage of copious foul smelling serous fluid.   Lab Tests/Imaging studies: I personally interpreted labs/imaging and the pertinent results include: Leukocytosis of 17.3, stable hemoglobin.  CMP unremarkable.  Negative ethanol, salicylate, acetaminophen.  Chest x-ray without acute abnormalities.  CT right hip shows new nondisplaced fracture extending from superior acetabulum to posterior iliac bone, as well as large fluid collection in the upper right quadriceps which could be hematoma versus abscess. I agree with the radiologist interpretation.  With patient's episode of hypoxia requiring 4 L [place] and no clinical improvement after anxiety resolved, will add on CT PE to rule out PE as cause of new SOB in the setting of recent surgery.  Medications: I ordered medication including naproxen for pain, Ativan for  anxiety/agitation, vancomycin. Fentanyl was ordered but held due to patient's improvement in pain. I have reviewed the patients home medicines and have made adjustments as needed.  Consultations obtained: TTS consultation was placed. Behavioral health referred evaluation to [facility], which is scheduled for [phone] with NP Candis Shine.  I consulted with orthopedic surgeon Dr Ranell Patrick who felt patient's fluid collection on CT is likely hematoma instead of abscess. Plans to consult in AM. Recommends non weight bearing at this time.   I consulted with hospitalist Dr Julian Reil who will admit.    Disposition: After consideration of the diagnostic results and the patients response to treatment, I feel that patient would benefit from admission for new hip fracture and cellulitis surrounding recent surgical site.  Final Clinical Impression(s) / ED Diagnoses Final diagnoses:  Right hip pain  Cellulitis of right lower extremity  Panic attack    Rx / DC Orders ED Discharge Orders     None      Portions of this report may have been transcribed using voice recognition software. Every effort was made to ensure accuracy; however, inadvertent computerized transcription errors may be present.    Su Monks, PA-C 03/11/23 0349    Jakarius Flamenco T, PA-C 03/11/23 0449    Lupe Bonner T, PA-C 03/11/23 0509    Charlynne Pander, MD 03/12/23 1018

## 2023-03-10 NOTE — ED Triage Notes (Signed)
Pt BIB EMS from home for panic attack initially, but upon arrival pt reports R hip pain. Pt was vitally stable on arrival and not in distress. Recent hip replacement in September. Pt reports 8/10 pain in R hip.

## 2023-03-10 NOTE — ED Provider Notes (Incomplete)
[place] EMERGENCY DEPARTMENT AT [facility] Provider Note   CSN: [id number] Arrival date & time: 03/10/23  2154     History {Add pertinent medical, surgical, social history, OB history to HPI:1} Chief Complaint  Patient presents with  . Hip Pain    Kenneth Montoya is a 82 y.o. male.  Panic attacks, especially at night "Won't be able to keep living like this" No SI plan TTS   Hip Pain       Home Medications Prior to Admission medications   Medication Sig Start Date End Date Taking? Authorizing Provider  amLODipine (NORVASC) 10 MG tablet TAKE 1 TABLET BY MOUTH AT  BEDTIME 06/29/22   Etta Grandchild, MD  ascorbic acid (VITAMIN C) 1000 MG tablet Take 1,000 mg by mouth at bedtime.    [provider]  atorvastatin (LIPITOR) 10 MG tablet TAKE 1 TABLET(10 MG) BY MOUTH DAILY 02/28/23   Etta Grandchild, MD  carvedilol (COREG) 6.25 MG tablet Take 1 tablet (6.25 mg total) by mouth 2 (two) times daily. 12/31/22   Lewayne Bunting, MD  clopidogrel (PLAVIX) 75 MG tablet TAKE 1 TABLET(75 MG) BY MOUTH DAILY 02/14/23   Etta Grandchild, MD  cyclobenzaprine (FLEXERIL) 5 MG tablet Take 2 tablets (10 mg total) by mouth 3 (three) times daily as needed for muscle spasms. 02/13/23   Eartha Inch, PA  DULoxetine (CYMBALTA) 30 MG capsule Take 1 capsule (30 mg total) by mouth daily. 11/22/22   Everlena Cooper, Adam R, DO  fluticasone (FLONASE) 50 MCG/ACT nasal spray Place 2 sprays into both nostrils daily. 02/07/22   Etta Grandchild, MD  hydrochlorothiazide (MICROZIDE) 12.5 MG capsule TAKE 1 CAPSULE(12.5 MG) BY MOUTH DAILY 02/28/23   Sharlene Dory, PA-C  HYDROcodone-acetaminophen (NORCO/VICODIN) 5-325 MG tablet Take 1-2 tablets by mouth every 6 (six) hours as needed for severe pain. 02/13/23   Eartha Inch, PA  loratadine (CLARITIN) 10 MG tablet Take 10 mg by mouth daily.    [provider]  losartan (COZAAR) 100 MG tablet TAKE 1 TABLET BY MOUTH DAILY 02/11/23   Etta Grandchild, MD  olopatadine (PATANOL) 0.1 % ophthalmic solution Place 1 drop into both eyes at bedtime.    [provider]  PREVIDENT 5000 PLUS 1.1 % CREA dental cream Place 1 Application onto teeth in the morning and at bedtime. 03/31/21   [provider]  psyllium (METAMUCIL) 58.6 % powder Take 1 packet by mouth at bedtime.    [provider]  RABEprazole (ACIPHEX) 20 MG tablet Take 1 tablet (20 mg total) by mouth daily. 10/23/22   Etta Grandchild, MD  tamsulosin (FLOMAX) 0.4 MG CAPS capsule Take 0.4 mg by mouth at bedtime. 09/17/19   [provider]  traZODone (DESYREL) 50 MG tablet Take 25 mg by mouth at bedtime. 05/08/22   [provider]      Allergies    Percocet [oxycodone-acetaminophen]    Review of Systems   Review of Systems  Physical Exam Updated Vital Signs BP (!) 155/77 (BP Location: Right Arm)   Pulse (!) 104   Temp 98.2 F (36.8 C) (Oral)   Resp 16   SpO2 100%  Physical Exam  ED Results / Procedures / Treatments   Labs (all labs ordered are listed, but only abnormal results are displayed) Labs Reviewed  CBC WITH DIFFERENTIAL/PLATELET  COMPREHENSIVE METABOLIC PANEL  ETHANOL  SALICYLATE LEVEL  ACETAMINOPHEN LEVEL    EKG None  Radiology No results found.  Procedures Procedures  {Document cardiac monitor, telemetry assessment procedure when appropriate:1}  Medications Ordered in ED Medications  vancomycin (VANCOCIN) IVPB 1000 mg/200 mL premix (has no administration in time range)  LORazepam (ATIVAN) injection 1 mg (has no administration in time range)    ED Course/ Medical Decision Making/ A&P   {   Click here for ABCD2, HEART and other calculatorsREFRESH Note before signing :1}                              Medical Decision Making Amount and/or Complexity of Data Reviewed Labs: ordered. Radiology: ordered.  Risk Prescription drug management.   ***  {Document critical care time when  appropriate:1} {Document review of labs and clinical decision tools ie heart score, Chads2Vasc2 etc:1}  {Document your independent review of radiology images, and any outside records:1} {Document your discussion with family members, caretakers, and with consultants:1} {Document social determinants of health affecting pt's care:1} {Document your decision making why or why not admission, treatments were needed:1} Final Clinical Impression(s) / ED Diagnoses Final diagnoses:  None    Rx / DC Orders ED Discharge Orders     None

## 2023-03-11 ENCOUNTER — Encounter (HOSPITAL_COMMUNITY): Payer: Self-pay | Admitting: Internal Medicine

## 2023-03-11 ENCOUNTER — Emergency Department (HOSPITAL_COMMUNITY): Payer: Medicare Other

## 2023-03-11 ENCOUNTER — Observation Stay (HOSPITAL_COMMUNITY): Payer: Medicare Other

## 2023-03-11 DIAGNOSIS — M9701XA Periprosthetic fracture around internal prosthetic right hip joint, initial encounter: Secondary | ICD-10-CM | POA: Diagnosis present

## 2023-03-11 DIAGNOSIS — L03818 Cellulitis of other sites: Secondary | ICD-10-CM | POA: Diagnosis not present

## 2023-03-11 DIAGNOSIS — I739 Peripheral vascular disease, unspecified: Secondary | ICD-10-CM | POA: Diagnosis present

## 2023-03-11 DIAGNOSIS — B9561 Methicillin susceptible Staphylococcus aureus infection as the cause of diseases classified elsewhere: Secondary | ICD-10-CM | POA: Diagnosis present

## 2023-03-11 DIAGNOSIS — G9341 Metabolic encephalopathy: Secondary | ICD-10-CM | POA: Diagnosis present

## 2023-03-11 DIAGNOSIS — N320 Bladder-neck obstruction: Secondary | ICD-10-CM | POA: Diagnosis not present

## 2023-03-11 DIAGNOSIS — R0902 Hypoxemia: Secondary | ICD-10-CM | POA: Diagnosis present

## 2023-03-11 DIAGNOSIS — T8130XA Disruption of wound, unspecified, initial encounter: Secondary | ICD-10-CM | POA: Diagnosis present

## 2023-03-11 DIAGNOSIS — N179 Acute kidney failure, unspecified: Secondary | ICD-10-CM | POA: Diagnosis present

## 2023-03-11 DIAGNOSIS — F4320 Adjustment disorder, unspecified: Secondary | ICD-10-CM

## 2023-03-11 DIAGNOSIS — T8141XA Infection following a procedure, superficial incisional surgical site, initial encounter: Secondary | ICD-10-CM | POA: Diagnosis present

## 2023-03-11 DIAGNOSIS — R7881 Bacteremia: Secondary | ICD-10-CM | POA: Diagnosis present

## 2023-03-11 DIAGNOSIS — T8142XA Infection following a procedure, deep incisional surgical site, initial encounter: Secondary | ICD-10-CM | POA: Diagnosis not present

## 2023-03-11 DIAGNOSIS — I38 Endocarditis, valve unspecified: Secondary | ICD-10-CM | POA: Diagnosis not present

## 2023-03-11 DIAGNOSIS — L0390 Cellulitis, unspecified: Secondary | ICD-10-CM | POA: Diagnosis present

## 2023-03-11 DIAGNOSIS — E861 Hypovolemia: Secondary | ICD-10-CM | POA: Diagnosis present

## 2023-03-11 DIAGNOSIS — E669 Obesity, unspecified: Secondary | ICD-10-CM | POA: Diagnosis present

## 2023-03-11 DIAGNOSIS — S72001A Fracture of unspecified part of neck of right femur, initial encounter for closed fracture: Secondary | ICD-10-CM | POA: Diagnosis present

## 2023-03-11 DIAGNOSIS — G9340 Encephalopathy, unspecified: Secondary | ICD-10-CM | POA: Diagnosis present

## 2023-03-11 DIAGNOSIS — I10 Essential (primary) hypertension: Secondary | ICD-10-CM

## 2023-03-11 DIAGNOSIS — D649 Anemia, unspecified: Secondary | ICD-10-CM | POA: Diagnosis present

## 2023-03-11 DIAGNOSIS — E871 Hypo-osmolality and hyponatremia: Secondary | ICD-10-CM

## 2023-03-11 DIAGNOSIS — F410 Panic disorder [episodic paroxysmal anxiety] without agoraphobia: Secondary | ICD-10-CM | POA: Diagnosis present

## 2023-03-11 DIAGNOSIS — R45851 Suicidal ideations: Secondary | ICD-10-CM | POA: Diagnosis present

## 2023-03-11 DIAGNOSIS — M7989 Other specified soft tissue disorders: Secondary | ICD-10-CM | POA: Diagnosis not present

## 2023-03-11 DIAGNOSIS — Z6831 Body mass index (BMI) 31.0-31.9, adult: Secondary | ICD-10-CM | POA: Diagnosis not present

## 2023-03-11 DIAGNOSIS — N400 Enlarged prostate without lower urinary tract symptoms: Secondary | ICD-10-CM | POA: Diagnosis present

## 2023-03-11 DIAGNOSIS — I110 Hypertensive heart disease with heart failure: Secondary | ICD-10-CM | POA: Diagnosis present

## 2023-03-11 DIAGNOSIS — T8149XA Infection following a procedure, other surgical site, initial encounter: Secondary | ICD-10-CM | POA: Diagnosis not present

## 2023-03-11 DIAGNOSIS — E222 Syndrome of inappropriate secretion of antidiuretic hormone: Secondary | ICD-10-CM | POA: Diagnosis present

## 2023-03-11 DIAGNOSIS — Y831 Surgical operation with implant of artificial internal device as the cause of abnormal reaction of the patient, or of later complication, without mention of misadventure at the time of the procedure: Secondary | ICD-10-CM | POA: Diagnosis present

## 2023-03-11 DIAGNOSIS — E785 Hyperlipidemia, unspecified: Secondary | ICD-10-CM | POA: Diagnosis present

## 2023-03-11 DIAGNOSIS — I2510 Atherosclerotic heart disease of native coronary artery without angina pectoris: Secondary | ICD-10-CM | POA: Diagnosis present

## 2023-03-11 DIAGNOSIS — I5032 Chronic diastolic (congestive) heart failure: Secondary | ICD-10-CM | POA: Diagnosis present

## 2023-03-11 DIAGNOSIS — S32401A Unspecified fracture of right acetabulum, initial encounter for closed fracture: Secondary | ICD-10-CM | POA: Diagnosis present

## 2023-03-11 DIAGNOSIS — W19XXXA Unspecified fall, initial encounter: Secondary | ICD-10-CM | POA: Diagnosis not present

## 2023-03-11 DIAGNOSIS — L03115 Cellulitis of right lower limb: Secondary | ICD-10-CM | POA: Diagnosis present

## 2023-03-11 DIAGNOSIS — T8451XA Infection and inflammatory reaction due to internal right hip prosthesis, initial encounter: Secondary | ICD-10-CM | POA: Diagnosis present

## 2023-03-11 LAB — COMPREHENSIVE METABOLIC PANEL
ALT: 21 U/L (ref 0–44)
AST: 25 U/L (ref 15–41)
Albumin: 3.6 g/dL (ref 3.5–5.0)
Alkaline Phosphatase: 107 U/L (ref 38–126)
Anion gap: 11 (ref 5–15)
BUN: 21 mg/dL (ref 8–23)
CO2: 23 mmol/L (ref 22–32)
Calcium: 8.7 mg/dL — ABNORMAL LOW (ref 8.9–10.3)
Chloride: 90 mmol/L — ABNORMAL LOW (ref 98–111)
Creatinine, Ser: 0.93 mg/dL (ref 0.61–1.24)
GFR, Estimated: >60 mL/min (ref >60)
Glucose, Bld: 116 mg/dL — ABNORMAL HIGH (ref 70–99)
Potassium: 4.6 mmol/L (ref 3.5–5.1)
Sodium: 124 mmol/L — ABNORMAL LOW (ref 135–145)
Total Bilirubin: 1.8 mg/dL — ABNORMAL HIGH (ref 0.3–1.2)
Total Protein: 7 g/dL (ref 6.5–8.1)

## 2023-03-11 LAB — URINALYSIS, COMPLETE (UACMP) WITH MICROSCOPIC
Bacteria, UA: NONE SEEN
Bilirubin Urine: NEGATIVE
Glucose, UA: NEGATIVE mg/dL
Hgb urine dipstick: NEGATIVE
Ketones, ur: NEGATIVE mg/dL
Leukocytes,Ua: NEGATIVE
Nitrite: NEGATIVE
Protein, ur: NEGATIVE mg/dL
Specific Gravity, Urine: >1.046 — ABNORMAL HIGH (ref 1.005–1.030)
pH: 6 (ref 5.0–8.0)

## 2023-03-11 LAB — SODIUM, URINE, RANDOM: Sodium, Ur: <10 mmol/L

## 2023-03-11 LAB — BASIC METABOLIC PANEL
Anion gap: 10 (ref 5–15)
BUN: 20 mg/dL (ref 8–23)
CO2: 23 mmol/L (ref 22–32)
Calcium: 8.5 mg/dL — ABNORMAL LOW (ref 8.9–10.3)
Chloride: 91 mmol/L — ABNORMAL LOW (ref 98–111)
Creatinine, Ser: 0.99 mg/dL (ref 0.61–1.24)
GFR, Estimated: >60 mL/min (ref >60)
Glucose, Bld: 102 mg/dL — ABNORMAL HIGH (ref 70–99)
Potassium: 4.4 mmol/L (ref 3.5–5.1)
Sodium: 124 mmol/L — ABNORMAL LOW (ref 135–145)

## 2023-03-11 LAB — CBC
HCT: 29.2 % — ABNORMAL LOW (ref 39.0–52.0)
Hemoglobin: 9.6 g/dL — ABNORMAL LOW (ref 13.0–17.0)
MCH: 31.3 pg (ref 26.0–34.0)
MCHC: 32.9 g/dL (ref 30.0–36.0)
MCV: 95.1 fL (ref 80.0–100.0)
Platelets: 212 10*3/uL (ref 150–400)
RBC: 3.07 MIL/uL — ABNORMAL LOW (ref 4.22–5.81)
RDW: 13.2 % (ref 11.5–15.5)
WBC: 14.1 10*3/uL — ABNORMAL HIGH (ref 4.0–10.5)
nRBC: 0 % (ref 0.0–0.2)

## 2023-03-11 LAB — SALICYLATE LEVEL: Salicylate Lvl: <7 mg/dL — ABNORMAL LOW (ref 7.0–30.0)

## 2023-03-11 LAB — PROCALCITONIN: Procalcitonin: 0.94 ng/mL

## 2023-03-11 LAB — ACETAMINOPHEN LEVEL: Acetaminophen (Tylenol), Serum: <10 ug/mL — ABNORMAL LOW (ref 10–30)

## 2023-03-11 LAB — ETHANOL: Alcohol, Ethyl (B): <10 mg/dL (ref <10)

## 2023-03-11 LAB — LACTIC ACID, PLASMA: Lactic Acid, Venous: 1.2 mmol/L (ref 0.5–1.9)

## 2023-03-11 LAB — OSMOLALITY, URINE: Osmolality, Ur: 424 mosm/kg (ref 300–900)

## 2023-03-11 MED ORDER — TAMSULOSIN HCL 0.4 MG PO CAPS
0.4000 mg | ORAL_CAPSULE | Freq: Every day | ORAL | Status: DC
  Administered 2023-03-11 – 2023-03-17 (×7): 0.4 mg via ORAL
  Filled 2023-03-11 (×7): qty 1

## 2023-03-11 MED ORDER — ENOXAPARIN SODIUM 40 MG/0.4ML IJ SOSY
40.0000 mg | PREFILLED_SYRINGE | INTRAMUSCULAR | Status: DC
  Administered 2023-03-11 – 2023-03-18 (×8): 40 mg via SUBCUTANEOUS
  Filled 2023-03-11 (×8): qty 0.4

## 2023-03-11 MED ORDER — ONDANSETRON HCL 4 MG/2ML IJ SOLN
4.0000 mg | Freq: Four times a day (QID) | INTRAMUSCULAR | Status: DC | PRN
  Administered 2023-03-12: 4 mg via INTRAVENOUS
  Filled 2023-03-11: qty 2

## 2023-03-11 MED ORDER — HYDROCODONE-ACETAMINOPHEN 5-325 MG PO TABS
1.0000 | ORAL_TABLET | Freq: Four times a day (QID) | ORAL | Status: DC | PRN
  Administered 2023-03-11: 2 via ORAL
  Administered 2023-03-11: 1 via ORAL
  Administered 2023-03-12 – 2023-03-13 (×3): 2 via ORAL
  Administered 2023-03-13: 1 via ORAL
  Administered 2023-03-13: 2 via ORAL
  Filled 2023-03-11: qty 1
  Filled 2023-03-11: qty 2
  Filled 2023-03-11 (×2): qty 1
  Filled 2023-03-11 (×6): qty 2

## 2023-03-11 MED ORDER — SODIUM CHLORIDE 0.9 % IV SOLN: INTRAVENOUS | Status: DC

## 2023-03-11 MED ORDER — ATORVASTATIN CALCIUM 10 MG PO TABS
10.0000 mg | ORAL_TABLET | Freq: Every day | ORAL | Status: DC
  Administered 2023-03-11 – 2023-03-18 (×8): 10 mg via ORAL
  Filled 2023-03-11 (×8): qty 1

## 2023-03-11 MED ORDER — FLUTICASONE PROPIONATE 50 MCG/ACT NA SUSP
2.0000 | Freq: Every day | NASAL | Status: DC
  Administered 2023-03-13 – 2023-03-18 (×6): 2 via NASAL
  Filled 2023-03-11: qty 16

## 2023-03-11 MED ORDER — CEFAZOLIN SODIUM-DEXTROSE 1-4 GM/50ML-% IV SOLN
1.0000 g | Freq: Three times a day (TID) | INTRAVENOUS | Status: DC
  Administered 2023-03-11 (×3): 1 g via INTRAVENOUS
  Filled 2023-03-11 (×5): qty 50

## 2023-03-11 MED ORDER — FENTANYL CITRATE PF 50 MCG/ML IJ SOSY: 50.0000 ug | PREFILLED_SYRINGE | Freq: Once | INTRAMUSCULAR | Status: DC

## 2023-03-11 MED ORDER — IOHEXOL 350 MG/ML SOLN
75.0000 mL | Freq: Once | INTRAVENOUS | Status: AC | PRN
  Administered 2023-03-11: 75 mL via INTRAVENOUS

## 2023-03-11 MED ORDER — PANTOPRAZOLE SODIUM 40 MG PO TBEC
40.0000 mg | DELAYED_RELEASE_TABLET | Freq: Every day | ORAL | Status: DC
  Administered 2023-03-11 – 2023-03-18 (×8): 40 mg via ORAL
  Filled 2023-03-11 (×8): qty 1

## 2023-03-11 MED ORDER — VITAMIN C 500 MG PO TABS
1000.0000 mg | ORAL_TABLET | Freq: Every day | ORAL | Status: DC
  Administered 2023-03-11 – 2023-03-17 (×7): 1000 mg via ORAL
  Filled 2023-03-11 (×7): qty 2

## 2023-03-11 MED ORDER — SODIUM CHLORIDE 0.9 % IV SOLN: INTRAVENOUS | Status: DC | PRN

## 2023-03-11 MED ORDER — ACETAMINOPHEN 650 MG RE SUPP: 650.0000 mg | Freq: Four times a day (QID) | RECTAL | Status: DC | PRN

## 2023-03-11 MED ORDER — IOHEXOL 300 MG/ML  SOLN
100.0000 mL | Freq: Once | INTRAMUSCULAR | Status: AC | PRN
  Administered 2023-03-11: 100 mL via INTRAVENOUS

## 2023-03-11 MED ORDER — ONDANSETRON HCL 4 MG PO TABS: 4.0000 mg | ORAL_TABLET | Freq: Four times a day (QID) | ORAL | Status: DC | PRN

## 2023-03-11 MED ORDER — DULOXETINE HCL 30 MG PO CPEP
30.0000 mg | ORAL_CAPSULE | Freq: Every day | ORAL | Status: DC
  Administered 2023-03-11 – 2023-03-13 (×3): 30 mg via ORAL
  Filled 2023-03-11 (×3): qty 1

## 2023-03-11 MED ORDER — ACETAMINOPHEN 325 MG PO TABS
650.0000 mg | ORAL_TABLET | Freq: Four times a day (QID) | ORAL | Status: DC | PRN
  Filled 2023-03-11: qty 2

## 2023-03-11 MED ORDER — CARVEDILOL 6.25 MG PO TABS
6.2500 mg | ORAL_TABLET | Freq: Two times a day (BID) | ORAL | Status: DC
  Administered 2023-03-11 – 2023-03-18 (×15): 6.25 mg via ORAL
  Filled 2023-03-11 (×15): qty 1

## 2023-03-11 MED ORDER — SODIUM CHLORIDE (PF) 0.9 % IJ SOLN
INTRAMUSCULAR | Status: AC
  Filled 2023-03-11: qty 50

## 2023-03-11 MED ORDER — FENTANYL CITRATE PF 50 MCG/ML IJ SOSY
25.0000 ug | PREFILLED_SYRINGE | INTRAMUSCULAR | Status: DC | PRN
  Administered 2023-03-11 – 2023-03-13 (×5): 25 ug via INTRAVENOUS
  Filled 2023-03-11 (×6): qty 1

## 2023-03-11 MED ORDER — SODIUM FLUORIDE 1.1 % DT CREA: 1.0000 | TOPICAL_CREAM | Freq: Two times a day (BID) | DENTAL | Status: DC

## 2023-03-11 MED ORDER — NAPROXEN 500 MG PO TABS
500.0000 mg | ORAL_TABLET | Freq: Once | ORAL | Status: AC
  Administered 2023-03-11: 500 mg via ORAL
  Filled 2023-03-11: qty 1

## 2023-03-11 MED ORDER — CYCLOBENZAPRINE HCL 10 MG PO TABS
10.0000 mg | ORAL_TABLET | Freq: Three times a day (TID) | ORAL | Status: DC | PRN
  Administered 2023-03-11 – 2023-03-18 (×11): 10 mg via ORAL
  Filled 2023-03-11 (×12): qty 1

## 2023-03-11 MED ORDER — LORATADINE 10 MG PO TABS
10.0000 mg | ORAL_TABLET | Freq: Every day | ORAL | Status: DC
  Administered 2023-03-11 – 2023-03-18 (×8): 10 mg via ORAL
  Filled 2023-03-11 (×8): qty 1

## 2023-03-11 MED ORDER — OLOPATADINE HCL 0.1 % OP SOLN
1.0000 [drp] | Freq: Every day | OPHTHALMIC | Status: DC
  Administered 2023-03-11 – 2023-03-17 (×7): 1 [drp] via OPHTHALMIC
  Filled 2023-03-11: qty 5

## 2023-03-11 MED ORDER — PSYLLIUM 95 % PO PACK
1.0000 | PACK | Freq: Every day | ORAL | Status: DC
  Administered 2023-03-11 – 2023-03-16 (×5): 1 via ORAL
  Filled 2023-03-11 (×7): qty 1

## 2023-03-11 NOTE — Assessment & Plan Note (Addendum)
Superficial cellulitis at surgical site CT suggests nondisplaced periprosthetic hip fracture.  - Consult Orthopedics - WBAT -PT/OT

## 2023-03-11 NOTE — BH Assessment (Signed)
TTS consult has been referred to [facility]. [facility] will reach out in established secure chat with name of provider and assessment time.

## 2023-03-11 NOTE — ED Notes (Signed)
TTS Consult in progress.

## 2023-03-11 NOTE — Plan of Care (Signed)
  Problem: Nutrition: Goal: Adequate nutrition will be maintained Outcome: Progressing   Problem: Coping: Goal: Level of anxiety will decrease Outcome: Progressing   Problem: Elimination: Goal: Will not experience complications related to bowel motility Outcome: Progressing Goal: Will not experience complications related to urinary retention Outcome: Adequate for Discharge   Problem: Pain Managment: Goal: General experience of comfort will improve Outcome: Progressing

## 2023-03-11 NOTE — Assessment & Plan Note (Addendum)
Sodium improved with Lasix - Hold duloxetine - Continue Lasix

## 2023-03-11 NOTE — Progress Notes (Addendum)
   Subjective:   Patient seen during rounds this AM. Hospitalized last night due to panic attacks, approximately 4 weeks s/p right THA with Dr. Lequita Halt. Over the weekend, states he felt a stitch pop and has had drainage from the proximal incision since. Has been on IV abx since admission (currently on Ancef) and CT was obtained.  Objective: Vital signs in last 24 hours: Temp:  [97.5 F (36.4 C)-98.4 F (36.9 C)] 97.5 F (36.4 C) (10/15 1006) Pulse Rate:  [96-106] 98 (10/15 1127) Resp:  [16-33] 18 (10/15 1006) BP: (113-162)/(50-80) 133/72 (10/15 1127) SpO2:  [99 %-100 %] 99 % (10/15 1127) Weight:  [94.8 kg] 94.8 kg (10/15 0532)  Intake/Output from previous day: No intake or output data in the 24 hours ending 03/11/23 1157  Intake/Output this shift: No intake/output data recorded.  Labs: Recent Labs    03/10/23 2336 03/11/23 0901  HGB 10.3* 9.6*   Recent Labs    03/10/23 2336 03/11/23 0901  WBC 17.3* 14.1*  RBC 3.33* 3.07*  HCT 31.5* 29.2*  PLT 258 212   Recent Labs    03/10/23 2336 03/11/23 0901  NA 124* 124*  K 4.6 4.4  CL 90* 91*  CO2 23 23  BUN 21 20  CREATININE 0.93 0.99  GLUCOSE 116* 102*  CALCIUM 8.7* 8.5*   No results for input(s): "LABPT", "INR" in the last 72 hours.  Exam: General - Patient is Alert and Oriented Extremity - Neurologically intact Neurovascular intact Sensation intact distally Dorsiflexion/Plantar flexion intact Dressing/Incision - Small opening at the proximal incision where the abdominal fold overlies. There is serosanguinous drainage present. Erythema at/around the mid-distal incision, essentially unchanged from photo in H&P note earlier today. Motor Function - intact, moving foot and toes well on exam.   Past Medical History:  Diagnosis Date   Anxiety    Arthritis    generalized   BPH (benign prostatic hyperplasia)    Cancer of skin, squamous cell    frozen    Cataract    bilateral sx   GERD (gastroesophageal reflux  disease)    on meds   Hypertension    on meds   Shingles    SIADH (syndrome of inappropriate ADH production) (HCC)    Stroke (HCC)     Assessment/Plan:    Principal Problem:   Closed right hip fracture, initial encounter (HCC) Active Problems:   Essential hypertension   Hyponatremia   Hypoxia   Acute encephalopathy   Cellulitis   Adjustment disorder  Estimated body mass index is 31.78 kg/m as calculated from the following:   Height as of this encounter: 5\' 8"  (1.727 m).   Weight as of this encounter: 94.8 kg.   Concerns arose from CT regarding fracture within acetabulum and intramuscular fluid collection.  Will hold on upgrading weight bearing status until Dr. Lequita Halt has time to personally review the CT. Continue IV Ancef for time being.  Addendum: Imaging reviewed by MD. Fracture line questionable, but stable regardless. Can WBAT to LLE. Fluid collection in question is consistent with postoperative changes.   Arther Abbott, PA-C Orthopedic Surgery [phone] 03/11/2023, 11:57 AM

## 2023-03-11 NOTE — Assessment & Plan Note (Addendum)
Source likely superficial cellulitis at surgical site.  Went to OR on 10/17 with Dr. Lequita Halt, debrided two fluid collections in the area of the incision, clinically did not appear to be involvement of the hip joint.    TTE negative  10/15 Blood culture x2: MSSA in 1/2 10/15 Urine culture: NG 10/17 surgical culture: MSSA 10/17 repeat blood culture x2: Abiotrophica defectiva in 1/2 10/18 repeat blood culture x2: NGTD  - Continue antibiotics - TEE pending - Consult ID, appreciate expertise

## 2023-03-11 NOTE — Assessment & Plan Note (Addendum)
Due to MSSA bacteremia, acute on chronic hyponatremia, opiates, superimposed on mild cognitive impairment probably incipient dementia. - Standard delirium precautions: blinds open and lights on during day, TV off, minimize interruptions at night, glasses/hearing aids, PT/OT, avoiding [place] list medications

## 2023-03-11 NOTE — Consult Note (Addendum)
[facility] Consult Note  Patient Name: Kenneth Montoya MRN: [id number] DOB: [birth year] DATE OF Consult: 03/11/2023  PRIMARY PSYCHIATRIC DIAGNOSES  1.  Adjustment disorder 2.  Hip pain   RECOMMENDATIONS  Medication recommendations: continue home medications as prescribed  Non-Medication/therapeutic recommendations: Refer to outpatient psychiatric services.   Communication: Treatment team members (and family members if applicable) who were involved in treatment/care discussions and planning, and with whom we spoke or engaged with via secure text/chat, include the following:  treatment team Laural Benes RN, [facility], [place] PA-c)  Thank you for involving [name] in the care of this patient. If you have any additional questions or concerns, please call [phone] and ask for me or the provider on-call.  TELEPSYCHIATRY ATTESTATION & CONSENT  As the provider for this telehealth consult, I attest that I verified the patient's identity using two separate identifiers, introduced myself to the patient, provided my credentials, disclosed my location, and performed this encounter via a HIPAA-compliant, real-time, face-to-face, two-way, interactive audio and video platform and with the full consent and agreement of the patient (or guardian as applicable.)  Patient physical location: [facility]. Telehealth provider physical location: home office in state of [place].  Video start time: 0328 (Central Time) Video end time: 0338 (Central Time)  IDENTIFYING DATA  Kenneth Montoya is a 82 y.o. year-old male for whom a psychiatric consultation has been ordered by the primary provider. The patient was identified using two separate identifiers.  CHIEF COMPLAINT/REASON FOR CONSULT  Hip pain  HISTORY OF PRESENT ILLNESS (HPI)  Patient is a 82 y.o. male with history of HTN, GERD, GAD, SIADH, TIA, recent R hip replacement.  Per ER notes, patient presents to the emergency department for several  complaints.  Apparently patient had originally called EMS with concern for a panic attack.  He has been experiencing severe anxiety and panic attacks, especially at night.  He is not having any relief with trazodone.  He made comments that he does not want to keep living like this.  Denied any plan to take his life.  Patient also complaining of right hip pain, he had a hip replacement on 9/18.  He states that he had a stitch "pop" and he has been having drainage from the site.  Denies any recent falls.  He normally takes Aleve for the pain and it was relieving it until now.  He has not had Aleve since earlier in the morning.  Denies any fevers or chills.  During this evaluation, patient reports that he arrived at the hospital after calling 911, seeking a professional evaluation and treatment options for his chief complaint of right hip pain persisting for the past 2 days, with today marking day 3. He notes an improvement in his condition since being in the ER compared to the previous days. He reports residing with his wife, and they share two adult children, ages 31 and 48. He has a past prescription history for depression but is not currently under psychiatric care and denies any current feelings of anxiety or depression. He reports no suicidal or homicidal ideations and has no history of auditory or visual hallucinations, psychiatric hospital admissions, or attempts at self-harm. During conversation, he expressed dissatisfaction with the emergency room conditions, describing his 3-hour wait as "draconian" due to the lack of beds and significant pain experienced. However, he mentions feeling better after managing to get some sleep. He is oriented to time, place, and person, accurately identifying the current month and the President of  the [place] as [place], though he could not recall his last meal before hospital admission.  PAST PSYCHIATRIC HISTORY  He has a past prescription history for depression  but is not currently under psychiatric care and denies any current feelings of anxiety or depression. He reports no suicidal or homicidal ideations and has no history of auditory or visual hallucinations, psychiatric hospital admissions, or attempts at self-harm.  PAST MEDICAL HISTORY  Past Medical History:  Diagnosis Date   Anxiety    Arthritis    generalized   BPH (benign prostatic hyperplasia)    Cancer of skin, squamous cell    frozen    Cataract    bilateral sx   GERD (gastroesophageal reflux disease)    on meds   Hypertension    on meds   Shingles    SIADH (syndrome of inappropriate ADH production) (HCC)    Stroke [facility])      HOME MEDICATIONS  Facility Ordered Medications  Medication   [COMPLETED] vancomycin (VANCOCIN) IVPB 1000 mg/200 mL premix   [COMPLETED] LORazepam (ATIVAN) injection 1 mg   [COMPLETED] naproxen (NAPROSYN) tablet 500 mg   [COMPLETED] iohexol (OMNIPAQUE) 300 MG/ML solution 100 mL   fentaNYL (SUBLIMAZE) injection 25 mcg   enoxaparin (LOVENOX) injection 40 mg   acetaminophen (TYLENOL) tablet 650 mg   Or   acetaminophen (TYLENOL) suppository 650 mg   ondansetron (ZOFRAN) tablet 4 mg   Or   ondansetron (ZOFRAN) injection 4 mg   ceFAZolin (ANCEF) IVPB 1 g/50 mL premix   PTA Medications  Medication Sig   ascorbic acid (VITAMIN C) 1000 MG tablet Take 1,000 mg by mouth at bedtime.   tamsulosin (FLOMAX) 0.4 MG CAPS capsule Take 0.4 mg by mouth at bedtime.   psyllium (METAMUCIL) 58.6 % powder Take 1 packet by mouth at bedtime.   olopatadine (PATANOL) 0.1 % ophthalmic solution Place 1 drop into both eyes at bedtime.   PREVIDENT 5000 PLUS 1.1 % CREA dental cream Place 1 Application onto teeth in the morning and at bedtime.   loratadine (CLARITIN) 10 MG tablet Take 10 mg by mouth daily.   amLODipine (NORVASC) 10 MG tablet TAKE 1 TABLET BY MOUTH AT  BEDTIME   traZODone (DESYREL) 50 MG tablet Take 25 mg by mouth at bedtime.   RABEprazole (ACIPHEX) 20 MG tablet  Take 1 tablet (20 mg total) by mouth daily.   DULoxetine (CYMBALTA) 30 MG capsule Take 1 capsule (30 mg total) by mouth daily.   carvedilol (COREG) 6.25 MG tablet Take 1 tablet (6.25 mg total) by mouth 2 (two) times daily.   losartan (COZAAR) 100 MG tablet TAKE 1 TABLET BY MOUTH DAILY   HYDROcodone-acetaminophen (NORCO/VICODIN) 5-325 MG tablet Take 1-2 tablets by mouth every 6 (six) hours as needed for severe pain.   cyclobenzaprine (FLEXERIL) 5 MG tablet Take 2 tablets (10 mg total) by mouth 3 (three) times daily as needed for muscle spasms.   clopidogrel (PLAVIX) 75 MG tablet TAKE 1 TABLET(75 MG) BY MOUTH DAILY   hydrochlorothiazide (MICROZIDE) 12.5 MG capsule TAKE 1 CAPSULE(12.5 MG) BY MOUTH DAILY   atorvastatin (LIPITOR) 10 MG tablet TAKE 1 TABLET(10 MG) BY MOUTH DAILY   fluticasone (FLONASE) 50 MCG/ACT nasal spray Place 2 sprays into both nostrils daily.     ALLERGIES  Allergies  Allergen Reactions   Percocet [Oxycodone-Acetaminophen] Itching    SOCIAL & SUBSTANCE USE HISTORY  Social History   Socioeconomic History   Marital status: Married    Spouse name:  Not on file   Number of children: Not on file   Years of education: Not on file   Highest education level: Not on file  Occupational History   Not on file  Tobacco Use   Smoking status: Never   Smokeless tobacco: Never  Vaping Use   Vaping status: Never Used  Substance and Sexual Activity   Alcohol use: Not Currently    Alcohol/week: 2.0 standard drinks of alcohol    Types: 2 Standard drinks or equivalent per week    Comment: wine 1-2 x a week and not every week    Drug use: Never   Sexual activity: Not Currently  Other Topics Concern   Not on file  Social History Narrative   Left handed   Social Determinants of Health   Financial Resource Strain: Low Risk  (03/06/2023)   Overall Financial Resource Strain (CARDIA)    Difficulty of Paying Living Expenses: Not hard at all  Food Insecurity: No Food Insecurity  (03/06/2023)   Hunger Vital Sign    Worried About Running Out of Food in the Last Year: Never true    Ran Out of Food in the Last Year: Never true  Transportation Needs: No Transportation Needs (03/06/2023)   PRAPARE - Administrator, Civil Service (Medical): No    Lack of Transportation (Non-Medical): No  Physical Activity: Inactive (03/06/2023)   Exercise Vital Sign    Days of Exercise per Week: 0 days    Minutes of Exercise per Session: 0 min  Stress: No Stress Concern Present (03/06/2023)   [employer] of Occupational Health - Occupational Stress Questionnaire    Feeling of Stress : Not at all  Social Connections: Socially Integrated (03/06/2023)   Social Connection and Isolation Panel [NHANES]    Frequency of Communication with Friends and Family: More than three times a week    Frequency of Social Gatherings with Friends and Family: More than three times a week    Attends Religious Services: More than 4 times per year    Active Member of [employer] or Organizations: Yes    Attends Engineer, structural: More than 4 times per year    Marital Status: Married   Social History   Tobacco Use  Smoking Status Never  Smokeless Tobacco Never   Social History   Substance and Sexual Activity  Alcohol Use Not Currently   Alcohol/week: 2.0 standard drinks of alcohol   Types: 2 Standard drinks or equivalent per week   Comment: wine 1-2 x a week and not every week    Social History   Substance and Sexual Activity  Drug Use Never    Additional pertinent information .  FAMILY HISTORY  Family History  Problem Relation Age of Onset   Colon polyps Mother 58   Stroke Maternal Grandfather    Colon cancer Neg Hx    Esophageal cancer Neg Hx    Rectal cancer Neg Hx    Stomach cancer Neg Hx    Family Psychiatric History (if known):  unknown  MENTAL STATUS EXAM (MSE)  Presentation  General Appearance: Appropriate for Environment Eye Contact:Fair Speech:Clear  and Coherent Speech Volume:Normal Handedness:Right  Mood and Affect  Mood:-- (denies feeling depressed) Affect:Appropriate  Thought Process  Thought Processes:Coherent Descriptions of Associations:Intact  Orientation:Full (Time, Place and Person)  Thought Content:Logical  History of Schizophrenia/Schizoaffective disorder:No data recorded Duration of Psychotic Symptoms:No data recorded Hallucinations:Hallucinations: None  Ideas of Reference:None  Suicidal Thoughts:Suicidal Thoughts: No  Homicidal  Thoughts:Homicidal Thoughts: No   Sensorium  Memory:Immediate Good; Recent Fair; Remote Good Judgment:Fair Insight:Fair  Executive Functions  Concentration:Fair Attention Span:Fair Recall:Good Fund of Knowledge:Good Language:Good  Psychomotor Activity  Psychomotor Activity:Psychomotor Activity: Normal  Assets  Assets:Communication Skills; Housing  Sleep  Sleep:No data recorded  VITALS  Blood pressure (!) 113/55, pulse 96, temperature 98.4 F (36.9 C), temperature source Oral, resp. rate (!) 31, SpO2 100%.  LABS  Admission on 03/10/2023  Component Date Value Ref Range Status   WBC 03/10/2023 17.3 (H)  4.0 - 10.5 K/uL Final   RBC 03/10/2023 3.33 (L)  4.22 - 5.81 MIL/uL Final   Hemoglobin 03/10/2023 10.3 (L)  13.0 - 17.0 g/dL Final   HCT 56/43/3295 31.5 (L)  39.0 - 52.0 % Final   MCV 03/10/2023 94.6  80.0 - 100.0 fL Final   MCH 03/10/2023 30.9  26.0 - 34.0 pg Final   MCHC 03/10/2023 32.7  30.0 - 36.0 g/dL Final   RDW 18/84/1660 12.9  11.5 - 15.5 % Final   Platelets 03/10/2023 258  150 - 400 K/uL Final   nRBC 03/10/2023 0.0  0.0 - 0.2 % Final   Neutrophils Relative % 03/10/2023 90  % Final   Neutro Abs 03/10/2023 15.5 (H)  1.7 - 7.7 K/uL Final   Lymphocytes Relative 03/10/2023 3  % Final   Lymphs Abs 03/10/2023 0.6 (L)  0.7 - 4.0 K/uL Final   Monocytes Relative 03/10/2023 4  % Final   Monocytes Absolute 03/10/2023 0.8  0.1 - 1.0 K/uL Final   Eosinophils  Relative 03/10/2023 0  % Final   Eosinophils Absolute 03/10/2023 0.0  0.0 - 0.5 K/uL Final   Basophils Relative 03/10/2023 1  % Final   Basophils Absolute 03/10/2023 0.1  0.0 - 0.1 K/uL Final   Immature Granulocytes 03/10/2023 2  % Final   Abs Immature Granulocytes 03/10/2023 0.41 (H)  0.00 - 0.07 K/uL Final   Performed at [facility], [address], [place], [place] [address]   Sodium 03/10/2023 124 (L)  135 - 145 mmol/L Final   Potassium 03/10/2023 4.6  3.5 - 5.1 mmol/L Final   Chloride 03/10/2023 90 (L)  98 - 111 mmol/L Final   CO2 03/10/2023 23  22 - 32 mmol/L Final   Glucose, Bld 03/10/2023 116 (H)  70 - 99 mg/dL Final   Glucose reference range applies only to samples taken after fasting for at least 8 hours.   BUN 03/10/2023 21  8 - 23 mg/dL Final   Creatinine, Ser 03/10/2023 0.93  0.61 - 1.24 mg/dL Final   Calcium 06/04/3233 8.7 (L)  8.9 - 10.3 mg/dL Final   Total Protein 57/32/2025 7.0  6.5 - 8.1 g/dL Final   Albumin 42/70/6237 3.6  3.5 - 5.0 g/dL Final   AST 62/83/1517 25  15 - 41 U/L Final   ALT 03/10/2023 21  0 - 44 U/L Final   Alkaline Phosphatase 03/10/2023 107  38 - 126 U/L Final   Total Bilirubin 03/10/2023 1.8 (H)  0.3 - 1.2 mg/dL Final   GFR, Estimated 03/10/2023 >60  >60 mL/min Final   Comment: (NOTE) Calculated using the CKD-EPI Creatinine Equation (2021)    Anion gap 03/10/2023 11  5 - 15 Final   Performed at [facility], [address] [place], [place] [address]   Alcohol, Ethyl (B) 03/10/2023 <10  <10 mg/dL Final   Comment: (NOTE) Lowest detectable limit for serum alcohol is 10 mg/dL.  For medical  purposes only. Performed at [facility], [address], [place], [place] [address]    Salicylate Lvl 03/10/2023 <7.0 (L)  7.0 - 30.0 mg/dL Final   Performed at [facility], [address] [place], [place] [address]   Acetaminophen (Tylenol), Serum 03/10/2023 <10 (L)  10 - 30 ug/mL  Final   Comment: (NOTE) Therapeutic concentrations vary significantly. A range of 10-30 ug/mL  may be an effective concentration for many patients. However, some  are best treated at concentrations outside of this range. Acetaminophen concentrations >150 ug/mL at 4 hours after ingestion  and >50 ug/mL at 12 hours after ingestion are often associated with  toxic reactions.  Performed at [facility], [address], [place], [place] [address]     PSYCHIATRIC REVIEW OF SYSTEMS (ROS)  Patient denies feeling depressed or anxious Denies having any form of hallucinations, paranoia or delusions Denies having suicidal and homicidal ideations  Additional findings:      Musculoskeletal: unable to assess      Gait & Station: Laying/Sitting      Pain Screening: Present - mild to moderate      Nutrition & Dental Concerns: none reported  RISK FORMULATION/ASSESSMENT  Is the patient experiencing any suicidal or homicidal ideations: No       Explain if yes:  Protective factors considered for safety management: supportive family and access to appropriate clinical interventions.  Risk factors/concerns considered for safety management:  Depression Physical illness/chronic pain Age over 45 Male gender  Is there a safety management plan with the patient and treatment team to minimize risk factors and promote protective factors: Yes           Explain: outpatient services Is crisis care placement or psychiatric hospitalization recommended: No     Based on my current evaluation and risk assessment, patient is determined at this time to be at:  Low risk  *RISK ASSESSMENT Risk assessment is a dynamic process; it is possible that this patient's condition, and risk level, may change. This should be re-evaluated and managed over time as appropriate. Please re-consult psychiatric consult services if additional assistance is needed in terms of risk assessment and management. If your team  decides to discharge this patient, please advise the patient how to best access emergency psychiatric services, or to call 911, if their condition worsens or they feel unsafe in any way.   Norval Morton, NP Telepsychiatry Consult Services

## 2023-03-11 NOTE — H&P (Signed)
History and Physical    Patient: Kenneth Montoya GUY:[id number] DOB: 02-15-1941 DOA: 03/10/2023 DOS: the patient was seen and examined on 03/11/2023 PCP: Etta Grandchild, MD  Patient coming from: Home  Chief Complaint:  Chief Complaint  Patient presents with   Hip Pain   HPI: Kenneth Montoya is a 82 y.o. male with medical history significant of SIADH, HTN, TIA.  Pt had R hip replacement x1 month ago.  Pt in to ED with several complaints: Apparently patient had originally called EMS with concern for a panic attack.  He has been experiencing severe anxiety and panic attacks, especially at night.  He is not having any relief with trazodone.  He made comments that he does not want to keep living like this.  Denied any plan to take his life.  Patient also complaining of right hip pain, he had a hip replacement on 9/18.  He states that he had a stitch "pop" and he has been having drainage from the site.  Denies any recent falls.  He normally takes Aleve for the pain and it was relieving it until now.  He has not had Aleve since earlier in the morning.  Denies any fevers or chills.  In ED: Found to have periprosthetic hip fx on CT hip along with fluid collection: abscess vs hematoma.  Pain controlled with just naproxen in ED.  Pt also hypoxic on room air.  Tachypnic.  Denies SOB to me despite this.   Review of Systems: As mentioned in the history of present illness. All other systems reviewed and are negative. Past Medical History:  Diagnosis Date   Anxiety    Arthritis    generalized   BPH (benign prostatic hyperplasia)    Cancer of skin, squamous cell    frozen    Cataract    bilateral sx   GERD (gastroesophageal reflux disease)    on meds   Hypertension    on meds   Shingles    SIADH (syndrome of inappropriate ADH production) (HCC)    Stroke [facility])    Past Surgical History:  Procedure Laterality Date   CARPAL TUNNEL RELEASE Left 09/09/2022   Procedure: LEFT CARPAL TUNNEL  RELEASE;  Surgeon: Betha Loa, MD;  Location: [facility];  Service: Orthopedics;  Laterality: Left;  Bier block   CATARACT EXTRACTION, BILATERAL     COLONOSCOPY  06/27/2020   COLONOSCOPY  2018   EYE SURGERY     gum graft     INGUINAL HERNIA REPAIR     as infant   LUMBAR LAMINECTOMY  06/2015   POLYPECTOMY  2018   TA x 3   REVERSE TOTAL SHOULDER ARTHROPLASTY Bilateral    TONSILLECTOMY AND ADENOIDECTOMY     age 43    TOTAL HIP ARTHROPLASTY Right 02/12/2023   Procedure: RIGHT TOTAL HIP ARTHROPLASTY ANTERIOR APPROACH;  Surgeon: Ollen Gross, MD;  Location: [facility];  Service: Orthopedics;  Laterality: Right;   TOTAL KNEE ARTHROPLASTY Bilateral    TOTAL KNEE REVISION Right 12/25/2017   TRANSURETHRAL RESECTION OF PROSTATE     x 2   UPPER GASTROINTESTINAL ENDOSCOPY     Social History:  reports that he has never smoked. He has never used smokeless tobacco. He reports that he does not currently use alcohol after a past usage of about 2.0 standard drinks of alcohol per week. He reports that he does not use drugs.  Allergies  Allergen Reactions   Percocet [Oxycodone-Acetaminophen] Itching    Family  History  Problem Relation Age of Onset   Colon polyps Mother 32   Stroke Maternal Grandfather    Colon cancer Neg Hx    Esophageal cancer Neg Hx    Rectal cancer Neg Hx    Stomach cancer Neg Hx     Prior to Admission medications   Medication Sig Start Date End Date Taking? Authorizing Provider  amLODipine (NORVASC) 10 MG tablet TAKE 1 TABLET BY MOUTH AT  BEDTIME 06/29/22   Etta Grandchild, MD  ascorbic acid (VITAMIN C) 1000 MG tablet Take 1,000 mg by mouth at bedtime.    [provider]  atorvastatin (LIPITOR) 10 MG tablet TAKE 1 TABLET(10 MG) BY MOUTH DAILY 02/28/23   Etta Grandchild, MD  carvedilol (COREG) 6.25 MG tablet Take 1 tablet (6.25 mg total) by mouth 2 (two) times daily. 12/31/22   Lewayne Bunting, MD  clopidogrel (PLAVIX) 75 MG tablet TAKE 1 TABLET(75 MG)  BY MOUTH DAILY 02/14/23   Etta Grandchild, MD  cyclobenzaprine (FLEXERIL) 5 MG tablet Take 2 tablets (10 mg total) by mouth 3 (three) times daily as needed for muscle spasms. 02/13/23   Eartha Inch, PA  DULoxetine (CYMBALTA) 30 MG capsule Take 1 capsule (30 mg total) by mouth daily. 11/22/22   Everlena Cooper, Adam R, DO  fluticasone (FLONASE) 50 MCG/ACT nasal spray Place 2 sprays into both nostrils daily. 02/07/22   Etta Grandchild, MD  hydrochlorothiazide (MICROZIDE) 12.5 MG capsule TAKE 1 CAPSULE(12.5 MG) BY MOUTH DAILY 02/28/23   Sharlene Dory, PA-C  HYDROcodone-acetaminophen (NORCO/VICODIN) 5-325 MG tablet Take 1-2 tablets by mouth every 6 (six) hours as needed for severe pain. 02/13/23   Eartha Inch, PA  loratadine (CLARITIN) 10 MG tablet Take 10 mg by mouth daily.    [provider]  losartan (COZAAR) 100 MG tablet TAKE 1 TABLET BY MOUTH DAILY 02/11/23   Etta Grandchild, MD  olopatadine (PATANOL) 0.1 % ophthalmic solution Place 1 drop into both eyes at bedtime.    [provider]  PREVIDENT 5000 PLUS 1.1 % CREA dental cream Place 1 Application onto teeth in the morning and at bedtime. 03/31/21   [provider]  psyllium (METAMUCIL) 58.6 % powder Take 1 packet by mouth at bedtime.    [provider]  RABEprazole (ACIPHEX) 20 MG tablet Take 1 tablet (20 mg total) by mouth daily. 10/23/22   Etta Grandchild, MD  tamsulosin (FLOMAX) 0.4 MG CAPS capsule Take 0.4 mg by mouth at bedtime. 09/17/19   [provider]  traZODone (DESYREL) 50 MG tablet Take 25 mg by mouth at bedtime. 05/08/22   [provider]    Physical Exam: Vitals:   03/11/23 0135 03/11/23 0300 03/11/23 0315 03/11/23 0354  BP: 129/64 116/61  (!) 113/55  Pulse: 100  96 96  Resp: (!) 30 (!) 28 (!) 33 (!) 31  Temp: 98.2 F (36.8 C)   98.4 F (36.9 C)  TempSrc:    Oral  SpO2: 100%  100% 100%   Constitutional: NAD, calm, comfortable, sleepy Respiratory: Pt  tachypnic Cardiovascular: Regular rate and rhythm, no murmurs / rubs / gallops. No extremity edema. 2+ pedal pulses. No carotid bruits.  Abdomen: no tenderness, no masses palpated. No hepatosplenomegaly. Bowel sounds positive.  Skin:  Neurologic: CN 2-12 grossly intact. Sensation intact, DTR normal. Strength 5/5 in all 4.  Psychiatric: sleepy but oriented  Data Reviewed:     Labs on Admission: I have personally reviewed following labs and  imaging studies  CBC: Recent Labs  Lab 03/10/23 2336  WBC 17.3*  NEUTROABS 15.5*  HGB 10.3*  HCT 31.5*  MCV 94.6  PLT 258   Basic Metabolic Panel: Recent Labs  Lab 03/10/23 2336  NA 124*  K 4.6  CL 90*  CO2 23  GLUCOSE 116*  BUN 21  CREATININE 0.93  CALCIUM 8.7*   GFR: Estimated Creatinine Clearance: 65 mL/min (by C-G formula based on SCr of 0.93 mg/dL). Liver Function Tests: Recent Labs  Lab 03/10/23 2336  AST 25  ALT 21  ALKPHOS 107  BILITOT 1.8*  PROT 7.0  ALBUMIN 3.6   No results for input(s): "LIPASE", "AMYLASE" in the last 168 hours. No results for input(s): "AMMONIA" in the last 168 hours. Coagulation Profile: No results for input(s): "INR", "PROTIME" in the last 168 hours. Cardiac Enzymes: No results for input(s): "CKTOTAL", "CKMB", "CKMBINDEX", "TROPONINI" in the last 168 hours. BNP (last 3 results) No results for input(s): "PROBNP" in the last 8760 hours. HbA1C: No results for input(s): "HGBA1C" in the last 72 hours. CBG: No results for input(s): "GLUCAP" in the last 168 hours. Lipid Profile: No results for input(s): "CHOL", "HDL", "LDLCALC", "TRIG", "CHOLHDL", "LDLDIRECT" in the last 72 hours. Thyroid Function Tests: No results for input(s): "TSH", "T4TOTAL", "FREET4", "T3FREE", "THYROIDAB" in the last 72 hours. Anemia Panel: No results for input(s): "VITAMINB12", "FOLATE", "FERRITIN", "TIBC", "IRON", "RETICCTPCT" in the last 72 hours. Urine analysis:    Component Value Date/Time   COLORURINE YELLOW  02/04/2023 0830   APPEARANCEUR CLEAR 02/04/2023 0830   LABSPEC 1.015 02/04/2023 0830   PHURINE 7.5 02/04/2023 0830   GLUCOSEU NEGATIVE 02/04/2023 0830   HGBUR NEGATIVE 02/04/2023 0830   BILIRUBINUR NEGATIVE 02/04/2023 0830   KETONESUR NEGATIVE 02/04/2023 0830   PROTEINUR NEGATIVE 01/18/2022 2010   UROBILINOGEN 0.2 02/04/2023 0830   NITRITE NEGATIVE 02/04/2023 0830   LEUKOCYTESUR NEGATIVE 02/04/2023 0830    Radiological Exams on Admission: DG Chest Portable 1 View  Result Date: 03/11/2023 CLINICAL DATA:  Shortness of breath, sneezing EXAM: PORTABLE CHEST 1 VIEW COMPARISON:  12/10/2020 FINDINGS: Linear bibasilar scarring or atelectasis. Heart and mediastinal contours within normal limits. Aortic atherosclerosis. No effusions. No acute bony abnormality. Bilateral shoulder replacements. IMPRESSION: Bibasilar scarring or atelectasis. Electronically Signed   By: Charlett Nose M.D.   On: 03/11/2023 02:16   CT HIP RIGHT W CONTRAST  Result Date: 03/11/2023 CLINICAL DATA:  Soft tissue infection suspected, hip, xray done R hip cellulitis with recent hip replacement EXAM: CT OF THE LOWER RIGHT EXTREMITY WITH CONTRAST TECHNIQUE: Multidetector CT imaging of the lower right extremity was performed according to the standard protocol following intravenous contrast administration. RADIATION DOSE REDUCTION: This exam was performed according to the departmental dose-optimization program which includes automated exposure control, adjustment of the mA and/or kV according to patient size and/or use of iterative reconstruction technique. CONTRAST:  OMNIPAQUE IOHEXOL 300 MG/ML  SOLN COMPARISON:  Plain films 02/12/2023 FINDINGS: Bones/Joint/Cartilage Changes of right hip replacement. There is a fracture extending superiorly from the medial superior acetabulum into the right iliac bone posteriorly. This is nondisplaced. Ligaments Suboptimally assessed by CT. Muscles and Tendons Grossly unremarkable fluid collection  within the superior quadriceps muscle laterally, measuring approximately 6 x 4 cm on image 48 of series 10. This could reflect postoperative intramuscular hematoma or abscess. Soft tissues Subcutaneous edema. IMPRESSION: Changes of right hip replacement. There is a nondisplaced fracture extending from the superior posteromedial acetabulum superiorly into the posterior iliac bone. Large fluid  collection laterally within the upper right quadriceps muscle. This could reflect a postoperative intramuscular hematoma or abscess. Electronically Signed   By: Charlett Nose M.D.   On: 03/11/2023 02:16    EKG: Independently reviewed.   Assessment and Plan: * Closed right hip fracture, initial encounter (HCC) Periprosthetic hip fx, ? If occurred during installation.  Dr. Ranell Patrick seeing pt currently, see his consult note. Soft tissue edema vs hematoma vs abscess. WBC 17k Got 1 round of Vanc in ED Putting pt on ancef for question of incision cellulitis at recommendation of Dr. Ranell Patrick. Dr. Despina Hick to see pt during day today and decide on next steps Not going to OR today per Dr. Ranell Patrick:  Also if hematoma present, its from the time of prior surgery / not likely acute.  Per him: Okay to eat Okay to start DVT ppx lovenox Checking procalcitonin Checking lactate Day doc / Dr. Despina Hick to decide on if he needs ongoing ABx or not BCx ordered Repeat CBC in AM to trend WBC Fentanyl ordered if needed for severe pain Tylenol for mild pain  Cellulitis Cellulitis about surgical site. See hip fx discussion above.  Hypoxia Pt denying SOB to me; however, has new O2 requirement, mild tachypnea, and sure looks SOB to me on exam. CXR not impressive. EDP ordering CTA chest to r/o PE, PNA Got 1 round of Vanc in ED Starting ancef for incision cellulitis as per Dr. Ranell Patrick recommendation  Acute encephalopathy EDP put in for TTS consult due to anxiety / geri-psych before work up demonstrated the hip abnormalities. Seems  oriented at this time. I wouldn't be surprised though if he just has some degree of intermittent AMS / delirium given multiple active acute to subacute medical conditions currently ongoing.  Hyponatremia Hyponatremia present on lab work today in ED, also present at time of DC last month. Looks like he has h/o SIADH according to problem list. Also looks like that he is on chronic hydrochlorothiazide according to med rec! Stop hydrochlorothiazide Repeat BMP this AM Urine sodium and urine osm ordered Holding off on any further IVF pending those results (given a h/o SIADH).  Essential hypertension BP low normal for the moment. Hold home BP meds while work up in progress, ? Hip infection, ? PNA / PE causing hypoxia.      Advance Care Planning:   Code Status: Full Code  Consults: Ortho surg  Family Communication: No family in room  Severity of Illness: The appropriate patient status for this patient is OBSERVATION. Observation status is judged to be reasonable and necessary in order to provide the required intensity of service to ensure the patient's safety. The patient's presenting symptoms, physical exam findings, and initial radiographic and laboratory data in the context of their medical condition is felt to place them at decreased risk for further clinical deterioration. Furthermore, it is anticipated that the patient will be medically stable for discharge from the hospital within 2 midnights of admission.   Author: Hillary Bow., DO 03/11/2023 4:28 AM  For on call review [email or web address].

## 2023-03-11 NOTE — Assessment & Plan Note (Addendum)
Blood pressure controlled - Continue Coreg - Lasix - Hold losartan and hydrochlorothiazide and amlodipine for now

## 2023-03-11 NOTE — ED Notes (Signed)
ED TO INPATIENT HANDOFF REPORT  ED Nurse Name and Phone #:  Gypsy Balsam [phone]  S Name/Age/Gender Kenneth Montoya 82 y.o. male Room/Bed: WA18/WA18  Code Status   Code Status: Full Code  Home/SNF/Other Home Patient oriented to: self, place, time, and situation Is this baseline? Yes   Triage Complete: Triage complete  Chief Complaint Closed right hip fracture, initial encounter [facility]) [S72.001A]  Triage Note Pt BIB EMS from home for panic attack initially, but upon arrival pt reports R hip pain. Pt was vitally stable on arrival and not in distress. Recent hip replacement in September. Pt reports 8/10 pain in R hip.   Allergies Allergies  Allergen Reactions   Percocet [Oxycodone-Acetaminophen] Itching    Level of Care/Admitting Diagnosis ED Disposition     ED Disposition  Admit   Condition  --   Comment  Hospital Area: [facility] [[id number]]  Level of Care: Progressive [102]  Admit to Progressive based on following criteria: MULTISYSTEM THREATS such as stable sepsis, metabolic/electrolyte imbalance with or without encephalopathy that is responding to early treatment.  Admit to Progressive based on following criteria: RESPIRATORY PROBLEMS hypoxemic/hypercapnic respiratory failure that is responsive to NIPPV (BiPAP) or High Flow Nasal Cannula (6-80 lpm). Frequent assessment/intervention, no > Q2 hrs < Q4 hrs, to maintain oxygenation and pulmonary hygiene.  May place patient in observation at [facility] or [facility] if equivalent level of care is available:: No  Covid Evaluation: Asymptomatic - no recent exposure (last 10 days) testing not required  Diagnosis: Closed right hip fracture, initial encounter [facility]) [[id number]]  Admitting Physician: Hillary Bow [phone]  Attending Physician: Hillary Bow [phone]          B Medical/Surgery History Past Medical History:  Diagnosis Date   Anxiety    Arthritis    generalized   BPH (benign prostatic  hyperplasia)    Cancer of skin, squamous cell    frozen    Cataract    bilateral sx   GERD (gastroesophageal reflux disease)    on meds   Hypertension    on meds   Shingles    SIADH (syndrome of inappropriate ADH production) (HCC)    Stroke [facility])    Past Surgical History:  Procedure Laterality Date   CARPAL TUNNEL RELEASE Left 09/09/2022   Procedure: LEFT CARPAL TUNNEL RELEASE;  Surgeon: Betha Loa, MD;  Location: [facility];  Service: Orthopedics;  Laterality: Left;  Bier block   CATARACT EXTRACTION, BILATERAL     COLONOSCOPY  06/27/2020   COLONOSCOPY  2018   EYE SURGERY     gum graft     INGUINAL HERNIA REPAIR     as infant   LUMBAR LAMINECTOMY  06/2015   POLYPECTOMY  2018   TA x 3   REVERSE TOTAL SHOULDER ARTHROPLASTY Bilateral    TONSILLECTOMY AND ADENOIDECTOMY     age 42    TOTAL HIP ARTHROPLASTY Right 02/12/2023   Procedure: RIGHT TOTAL HIP ARTHROPLASTY ANTERIOR APPROACH;  Surgeon: Ollen Gross, MD;  Location: [facility];  Service: Orthopedics;  Laterality: Right;   TOTAL KNEE ARTHROPLASTY Bilateral    TOTAL KNEE REVISION Right 12/25/2017   TRANSURETHRAL RESECTION OF PROSTATE     x 2   UPPER GASTROINTESTINAL ENDOSCOPY       A IV Location/Drains/Wounds Patient Lines/Drains/Airways Status     Active Line/Drains/Airways     Name Placement date Placement time Site Days   Peripheral IV 03/10/23 22 G Anterior;Distal;Left;Upper Arm 03/10/23  2339  Arm  1            Intake/Output Last 24 hours No intake or output data in the 24 hours ending 03/11/23 0409  Labs/Imaging Results for orders placed or performed during the hospital encounter of 03/10/23 (from the past 48 hour(s))  CBC with Differential     Status: Abnormal   Collection Time: 03/10/23 11:36 PM  Result Value Ref Range   WBC 17.3 (H) 4.0 - 10.5 K/uL   RBC 3.33 (L) 4.22 - 5.81 MIL/uL   Hemoglobin 10.3 (L) 13.0 - 17.0 g/dL   HCT 16.1 (L) 09.6 - 04.5 %   MCV 94.6 80.0 - 100.0 fL   MCH  30.9 26.0 - 34.0 pg   MCHC 32.7 30.0 - 36.0 g/dL   RDW 40.9 81.1 - 91.4 %   Platelets 258 150 - 400 K/uL   nRBC 0.0 0.0 - 0.2 %   Neutrophils Relative % 90 %   Neutro Abs 15.5 (H) 1.7 - 7.7 K/uL   Lymphocytes Relative 3 %   Lymphs Abs 0.6 (L) 0.7 - 4.0 K/uL   Monocytes Relative 4 %   Monocytes Absolute 0.8 0.1 - 1.0 K/uL   Eosinophils Relative 0 %   Eosinophils Absolute 0.0 0.0 - 0.5 K/uL   Basophils Relative 1 %   Basophils Absolute 0.1 0.0 - 0.1 K/uL   Immature Granulocytes 2 %   Abs Immature Granulocytes 0.41 (H) 0.00 - 0.07 K/uL    Comment: Performed at [facility], [address], [place], [place] [address]  Comprehensive metabolic panel     Status: Abnormal   Collection Time: 03/10/23 11:36 PM  Result Value Ref Range   Sodium 124 (L) 135 - 145 mmol/L   Potassium 4.6 3.5 - 5.1 mmol/L   Chloride 90 (L) 98 - 111 mmol/L   CO2 23 22 - 32 mmol/L   Glucose, Bld 116 (H) 70 - 99 mg/dL    Comment: Glucose reference range applies only to samples taken after fasting for at least 8 hours.   BUN 21 8 - 23 mg/dL   Creatinine, Ser 6.21 0.61 - 1.24 mg/dL   Calcium 8.7 (L) 8.9 - 10.3 mg/dL   Total Protein 7.0 6.5 - 8.1 g/dL   Albumin 3.6 3.5 - 5.0 g/dL   AST 25 15 - 41 U/L   ALT 21 0 - 44 U/L   Alkaline Phosphatase 107 38 - 126 U/L   Total Bilirubin 1.8 (H) 0.3 - 1.2 mg/dL   GFR, Estimated >30 >86 mL/min    Comment: (NOTE) Calculated using the CKD-EPI Creatinine Equation (2021)    Anion gap 11 5 - 15    Comment: Performed at [facility], [address], [place], [place] [address]  Ethanol     Status: None   Collection Time: 03/10/23 11:36 PM  Result Value Ref Range   Alcohol, Ethyl (B) <10 <10 mg/dL    Comment: (NOTE) Lowest detectable limit for serum alcohol is 10 mg/dL.  For medical purposes only. Performed at [facility], [address] [place], [place] [address]   Salicylate level     Status: Abnormal    Collection Time: 03/10/23 11:36 PM  Result Value Ref Range   Salicylate Lvl <7.0 (L) 7.0 - 30.0 mg/dL    Comment: Performed at [facility], [address] [place], [place] [address]  Acetaminophen level     Status: Abnormal   Collection  Time: 03/10/23 11:36 PM  Result Value Ref Range   Acetaminophen (Tylenol), Serum <10 (L) 10 - 30 ug/mL    Comment: (NOTE) Therapeutic concentrations vary significantly. A range of 10-30 ug/mL  may be an effective concentration for many patients. However, some  are best treated at concentrations outside of this range. Acetaminophen concentrations >150 ug/mL at 4 hours after ingestion  and >50 ug/mL at 12 hours after ingestion are often associated with  toxic reactions.  Performed at [facility], [address], [place], [place] [address]    DG Chest Portable 1 View  Result Date: 03/11/2023 CLINICAL DATA:  Shortness of breath, sneezing EXAM: PORTABLE CHEST 1 VIEW COMPARISON:  12/10/2020 FINDINGS: Linear bibasilar scarring or atelectasis. Heart and mediastinal contours within normal limits. Aortic atherosclerosis. No effusions. No acute bony abnormality. Bilateral shoulder replacements. IMPRESSION: Bibasilar scarring or atelectasis. Electronically Signed   By: Charlett Nose M.D.   On: 03/11/2023 02:16   CT HIP RIGHT W CONTRAST  Result Date: 03/11/2023 CLINICAL DATA:  Soft tissue infection suspected, hip, xray done R hip cellulitis with recent hip replacement EXAM: CT OF THE LOWER RIGHT EXTREMITY WITH CONTRAST TECHNIQUE: Multidetector CT imaging of the lower right extremity was performed according to the standard protocol following intravenous contrast administration. RADIATION DOSE REDUCTION: This exam was performed according to the departmental dose-optimization program which includes automated exposure control, adjustment of the mA and/or kV according to patient size and/or use of iterative reconstruction technique.  CONTRAST:  OMNIPAQUE IOHEXOL 300 MG/ML  SOLN COMPARISON:  Plain films 02/12/2023 FINDINGS: Bones/Joint/Cartilage Changes of right hip replacement. There is a fracture extending superiorly from the medial superior acetabulum into the right iliac bone posteriorly. This is nondisplaced. Ligaments Suboptimally assessed by CT. Muscles and Tendons Grossly unremarkable fluid collection within the superior quadriceps muscle laterally, measuring approximately 6 x 4 cm on image 48 of series 10. This could reflect postoperative intramuscular hematoma or abscess. Soft tissues Subcutaneous edema. IMPRESSION: Changes of right hip replacement. There is a nondisplaced fracture extending from the superior posteromedial acetabulum superiorly into the posterior iliac bone. Large fluid collection laterally within the upper right quadriceps muscle. This could reflect a postoperative intramuscular hematoma or abscess. Electronically Signed   By: Charlett Nose M.D.   On: 03/11/2023 02:16    Pending Labs Unresulted Labs (From admission, onward)     Start     Ordered   03/11/23 0500  CBC  Tomorrow morning,   R        03/11/23 0401   03/11/23 0500  Basic metabolic panel  Tomorrow morning,   R        03/11/23 0401   03/11/23 0408  Culture, blood (Routine X 2) w Reflex to ID Panel  BLOOD CULTURE X 2,   R (with TIMED occurrences)      03/11/23 0408   03/11/23 0401  Procalcitonin  Once,   R       References:    Procalcitonin Lower Respiratory Tract Infection AND Sepsis Procalcitonin Algorithm   03/11/23 0401   03/11/23 0401  Lactic acid, plasma  (Lactic Acid)  STAT Now then every 3 hours,   R (with STAT occurrences)      03/11/23 0401            Vitals/Pain Today's Vitals   03/11/23 0135 03/11/23 0300 03/11/23 0315 03/11/23 0354  BP: 129/64 116/61  (!) 113/55  Pulse: 100  96 96  Resp: (!) 30 (!) 28 (!)  33 (!) 31  Temp: 98.2 F (36.8 C)   98.4 F (36.9 C)  TempSrc:    Oral  SpO2: 100%  100% 100%  PainSc:         Isolation Precautions No active isolations  Medications Medications  fentaNYL (SUBLIMAZE) injection 25 mcg (has no administration in time range)  enoxaparin (LOVENOX) injection 40 mg (has no administration in time range)  acetaminophen (TYLENOL) tablet 650 mg (has no administration in time range)    Or  acetaminophen (TYLENOL) suppository 650 mg (has no administration in time range)  ondansetron (ZOFRAN) tablet 4 mg (has no administration in time range)    Or  ondansetron (ZOFRAN) injection 4 mg (has no administration in time range)  ceFAZolin (ANCEF) IVPB 1 g/50 mL premix (has no administration in time range)  vancomycin (VANCOCIN) IVPB 1000 mg/200 mL premix (1,000 mg Intravenous New Bag/Given 03/10/23 2351)  LORazepam (ATIVAN) injection 1 mg (1 mg Intravenous Given 03/10/23 2351)  naproxen (NAPROSYN) tablet 500 mg (500 mg Oral Given 03/11/23 0027)  iohexol (OMNIPAQUE) 300 MG/ML solution 100 mL (100 mLs Intravenous Contrast Given 03/11/23 0010)    Mobility Typically walks at baseline but MD placed order for NWB-RLE       R Report given to:

## 2023-03-11 NOTE — Progress Notes (Signed)
I have seen and assessed patient and agree with Dr. Boston Service assessment and plan. Patient is a 82 year old gentleman history of SIADH, hypertension, TIA, recent right hip replacement a month ago.  Patient presented to the ED with multiple complaints initially due to a panic attack as he has been experiencing severe anxiety especially at night.  Patient noted not to have any relief with trazodone.  Patient also with complaints of right hip pain and felt he had a stitch popping been noticing some drainage.  CT hip done with a periprosthetic hip fracture with a fluid collection with concerns for abscess versus hematoma.  Patient also noted to be hypoxic on room air on presentation.  Patient denies shortness of breath.  CT angiogram chest done negative for PE.  Patient admitted placed on empiric IV antibiotics for probable cellulitis.  Orthopedics consulted.  Assessment/plan 1.  Closed right hip fracture/concerns for periprosthetic fracture -Patient noted to have received IV vancomycin in the ED due to concerns for an incisional cellulitis at the recommendation of orthopedics, Dr. Debby Bud. -Patient seen in consultation by orthopedics overnight who recommended treatment with empiric IV antibiotics for possible cellulitis pending further evaluation by patient's primary orthopod. -Blood cultures ordered and pending. -Continue IV Ancef. -Pain management. -Orthopedics consulted and following and will defer weightbearing status to orthopedics and further PT OT orders per orthopedics.  2.  Cellulitis -Concern for surgical site cellulitis. -IV Ancef.  3.  Hypoxia -Patient noted to be hypoxic on presentation to the ED however denied any shortness of breath. -Chest x-ray with no significant abnormalities noted. -CT angiogram chest done negative for PE. -Hypoxia improved patient with sats of 98% on room air.  4.  Hyponatremia -Likely secondary to hypovolemic hyponatremia in the setting of HCTZ. -Patient  with prior history of SIADH. -Patient clinically dry on examination. -Urine sodium < 10 -Urine osmolality of 424. -TSH of 2.25 on 02/04/2023. -Place on gentle hydration and repeat labs in the AM. -Supportive care.  5.  Hypertension -Hold home regimen antihypertensive medications. -Resume home regimen Coreg.  6.  Acute encephalopathy -EDP putting a TTS consult due to anxiety/Geri psych prior to workup demonstrating hip abnormalities. -Patient oriented. -Patient seen by TTS who are recommending resumption of home medications with outpatient follow-up.  No charge

## 2023-03-11 NOTE — Consult Note (Signed)
Reason for Consult:Right hip draining wound, acetabular fracture Referring Physician: EDP  Kenneth Montoya is an 82 y.o. male.  HPI: 82 yo male who is one month out from a right anterior total hip replacement. He reports a suture popping and the onset of drainage from his anterior incision. He presents to the ED with increasing pain and drainage from the incision. During the work up here at [facility], a CT scan of the pelvis showed a superior acetabular fracture extending into the ilium which is not displaced. There is also a fluid collection consistent with hematoma vs abscess. Medicine planning admission for medical management of oxygen dependence, delirium, and cellulitis vs deep infection.   Past Medical History:  Diagnosis Date   Anxiety    Arthritis    generalized   BPH (benign prostatic hyperplasia)    Cancer of skin, squamous cell    frozen    Cataract    bilateral sx   GERD (gastroesophageal reflux disease)    on meds   Hypertension    on meds   Shingles    SIADH (syndrome of inappropriate ADH production) (HCC)    Stroke [facility])     Past Surgical History:  Procedure Laterality Date   CARPAL TUNNEL RELEASE Left 09/09/2022   Procedure: LEFT CARPAL TUNNEL RELEASE;  Surgeon: Betha Loa, MD;  Location: [facility];  Service: Orthopedics;  Laterality: Left;  Bier block   CATARACT EXTRACTION, BILATERAL     COLONOSCOPY  06/27/2020   COLONOSCOPY  2018   EYE SURGERY     gum graft     INGUINAL HERNIA REPAIR     as infant   LUMBAR LAMINECTOMY  06/2015   POLYPECTOMY  2018   TA x 3   REVERSE TOTAL SHOULDER ARTHROPLASTY Bilateral    TONSILLECTOMY AND ADENOIDECTOMY     age 41    TOTAL HIP ARTHROPLASTY Right 02/12/2023   Procedure: RIGHT TOTAL HIP ARTHROPLASTY ANTERIOR APPROACH;  Surgeon: Ollen Gross, MD;  Location: [facility];  Service: Orthopedics;  Laterality: Right;   TOTAL KNEE ARTHROPLASTY Bilateral    TOTAL KNEE REVISION Right 12/25/2017   TRANSURETHRAL RESECTION OF  PROSTATE     x 2   UPPER GASTROINTESTINAL ENDOSCOPY      Family History  Problem Relation Age of Onset   Colon polyps Mother 5   Stroke Maternal Grandfather    Colon cancer Neg Hx    Esophageal cancer Neg Hx    Rectal cancer Neg Hx    Stomach cancer Neg Hx     Social History:  reports that he has never smoked. He has never used smokeless tobacco. He reports that he does not currently use alcohol after a past usage of about 2.0 standard drinks of alcohol per week. He reports that he does not use drugs.  Allergies:  Allergies  Allergen Reactions   Percocet [Oxycodone-Acetaminophen] Itching    Medications: I have reviewed the patient's current medications.  Results for orders placed or performed during the hospital encounter of 03/10/23 (from the past 48 hour(s))  CBC with Differential     Status: Abnormal   Collection Time: 03/10/23 11:36 PM  Result Value Ref Range   WBC 17.3 (H) 4.0 - 10.5 K/uL   RBC 3.33 (L) 4.22 - 5.81 MIL/uL   Hemoglobin 10.3 (L) 13.0 - 17.0 g/dL   HCT 29.5 (L) 62.1 - 30.8 %   MCV 94.6 80.0 - 100.0 fL   MCH 30.9 26.0 - 34.0 pg  MCHC 32.7 30.0 - 36.0 g/dL   RDW 53.6 64.4 - 03.4 %   Platelets 258 150 - 400 K/uL   nRBC 0.0 0.0 - 0.2 %   Neutrophils Relative % 90 %   Neutro Abs 15.5 (H) 1.7 - 7.7 K/uL   Lymphocytes Relative 3 %   Lymphs Abs 0.6 (L) 0.7 - 4.0 K/uL   Monocytes Relative 4 %   Monocytes Absolute 0.8 0.1 - 1.0 K/uL   Eosinophils Relative 0 %   Eosinophils Absolute 0.0 0.0 - 0.5 K/uL   Basophils Relative 1 %   Basophils Absolute 0.1 0.0 - 0.1 K/uL   Immature Granulocytes 2 %   Abs Immature Granulocytes 0.41 (H) 0.00 - 0.07 K/uL    Comment: Performed at [facility], [address] [place], [place] [address]  Comprehensive metabolic panel     Status: Abnormal   Collection Time: 03/10/23 11:36 PM  Result Value Ref Range   Sodium 124 (L) 135 - 145 mmol/L   Potassium 4.6 3.5 - 5.1 mmol/L   Chloride 90 (L) 98 - 111  mmol/L   CO2 23 22 - 32 mmol/L   Glucose, Bld 116 (H) 70 - 99 mg/dL    Comment: Glucose reference range applies only to samples taken after fasting for at least 8 hours.   BUN 21 8 - 23 mg/dL   Creatinine, Ser 5.63 0.61 - 1.24 mg/dL   Calcium 8.7 (L) 8.9 - 10.3 mg/dL   Total Protein 7.0 6.5 - 8.1 g/dL   Albumin 3.6 3.5 - 5.0 g/dL   AST 25 15 - 41 U/L   ALT 21 0 - 44 U/L   Alkaline Phosphatase 107 38 - 126 U/L   Total Bilirubin 1.8 (H) 0.3 - 1.2 mg/dL   GFR, Estimated >87 >56 mL/min    Comment: (NOTE) Calculated using the CKD-EPI Creatinine Equation (2021)    Anion gap 11 5 - 15    Comment: Performed at [facility], [address] [place], [place] [address]  Ethanol     Status: None   Collection Time: 03/10/23 11:36 PM  Result Value Ref Range   Alcohol, Ethyl (B) <10 <10 mg/dL    Comment: (NOTE) Lowest detectable limit for serum alcohol is 10 mg/dL.  For medical purposes only. Performed at [facility], [address] [place], [place] [address]   Salicylate level     Status: Abnormal   Collection Time: 03/10/23 11:36 PM  Result Value Ref Range   Salicylate Lvl <7.0 (L) 7.0 - 30.0 mg/dL    Comment: Performed at [facility], [address] [place], [place] [address]  Acetaminophen level     Status: Abnormal   Collection Time: 03/10/23 11:36 PM  Result Value Ref Range   Acetaminophen (Tylenol), Serum <10 (L) 10 - 30 ug/mL    Comment: (NOTE) Therapeutic concentrations vary significantly. A range of 10-30 ug/mL  may be an effective concentration for many patients. However, some  are best treated at concentrations outside of this range. Acetaminophen concentrations >150 ug/mL at 4 hours after ingestion  and >50 ug/mL at 12 hours after ingestion are often associated with  toxic reactions.  Performed at [facility], [address] [place], [place] [address]     DG Chest Portable 1  View  Result Date: 03/11/2023 CLINICAL DATA:  Shortness of breath, sneezing EXAM: PORTABLE CHEST 1 VIEW COMPARISON:  12/10/2020 FINDINGS: Linear bibasilar scarring or  atelectasis. Heart and mediastinal contours within normal limits. Aortic atherosclerosis. No effusions. No acute bony abnormality. Bilateral shoulder replacements. IMPRESSION: Bibasilar scarring or atelectasis. Electronically Signed   By: Charlett Nose M.D.   On: 03/11/2023 02:16   CT HIP RIGHT W CONTRAST  Result Date: 03/11/2023 CLINICAL DATA:  Soft tissue infection suspected, hip, xray done R hip cellulitis with recent hip replacement EXAM: CT OF THE LOWER RIGHT EXTREMITY WITH CONTRAST TECHNIQUE: Multidetector CT imaging of the lower right extremity was performed according to the standard protocol following intravenous contrast administration. RADIATION DOSE REDUCTION: This exam was performed according to the departmental dose-optimization program which includes automated exposure control, adjustment of the mA and/or kV according to patient size and/or use of iterative reconstruction technique. CONTRAST:  OMNIPAQUE IOHEXOL 300 MG/ML  SOLN COMPARISON:  Plain films 02/12/2023 FINDINGS: Bones/Joint/Cartilage Changes of right hip replacement. There is a fracture extending superiorly from the medial superior acetabulum into the right iliac bone posteriorly. This is nondisplaced. Ligaments Suboptimally assessed by CT. Muscles and Tendons Grossly unremarkable fluid collection within the superior quadriceps muscle laterally, measuring approximately 6 x 4 cm on image 48 of series 10. This could reflect postoperative intramuscular hematoma or abscess. Soft tissues Subcutaneous edema. IMPRESSION: Changes of right hip replacement. There is a nondisplaced fracture extending from the superior posteromedial acetabulum superiorly into the posterior iliac bone. Large fluid collection laterally within the upper right quadriceps muscle. This could reflect  a postoperative intramuscular hematoma or abscess. Electronically Signed   By: Charlett Nose M.D.   On: 03/11/2023 02:16    Review of Systems Blood pressure 116/61, pulse 96, temperature 98.2 F (36.8 C), resp. rate (!) 33, SpO2 100%. Physical Exam Patient is awake and responds to commands. He is oriented to person. He reports mild hip pain on the right but states he was able to walk on the hip.  Right anterior hip incision with 2-3 mm opening in the proximal aspect of the incision. Mild serous drainage from the incision. Moderate pitting edema present under the incision in the proximal anterior thigh. Mild erythema present around the incision. No distal thigh swelling or tenderness. No pain with passive ROM of the hip. Neg Homans test bilaterally. Distally NVI  Assessment/Plan: Right hip open wound with serous drainage and probable cellulitis. Patient received Vancomycin in the ED.  Medicine admitting and starting IV Ancef. CT scan shows fluid accumulated in the anterior soft tissues that to me looks post surgical with surrounding edema and not like an abscess.   Right acetabular fracture likely from acetabular component insertion in the setting of osteopenia/osteoporosis. There is no displacement of this fracture which appears stable. Patient did report a fall "a few weeks ago" but could not elaborate. He seems somewhat confused. His wife and daughter had been at the bedside earlier but have gone home so cannot confirm any post op fall. - Recommend protective weight bearing on the right side but will recommend mobilization with therapy while here.   Hypoxia with O2 dependence and delirium. Medical workout initiated to rule out PE or other cardiopulmonary cause.   Will alert Dr Despina Hick to the admission and have added him to the treatment team  Verlee Rossetti 03/11/2023, 3:54 AM

## 2023-03-12 ENCOUNTER — Inpatient Hospital Stay (HOSPITAL_COMMUNITY): Payer: Medicare Other

## 2023-03-12 DIAGNOSIS — S72001A Fracture of unspecified part of neck of right femur, initial encounter for closed fracture: Secondary | ICD-10-CM | POA: Diagnosis not present

## 2023-03-12 DIAGNOSIS — B9561 Methicillin susceptible Staphylococcus aureus infection as the cause of diseases classified elsewhere: Secondary | ICD-10-CM | POA: Diagnosis not present

## 2023-03-12 DIAGNOSIS — N179 Acute kidney failure, unspecified: Secondary | ICD-10-CM | POA: Insufficient documentation

## 2023-03-12 DIAGNOSIS — I739 Peripheral vascular disease, unspecified: Secondary | ICD-10-CM | POA: Insufficient documentation

## 2023-03-12 DIAGNOSIS — R7881 Bacteremia: Secondary | ICD-10-CM | POA: Diagnosis not present

## 2023-03-12 DIAGNOSIS — N320 Bladder-neck obstruction: Secondary | ICD-10-CM | POA: Diagnosis not present

## 2023-03-12 DIAGNOSIS — D649 Anemia, unspecified: Secondary | ICD-10-CM | POA: Insufficient documentation

## 2023-03-12 DIAGNOSIS — M7989 Other specified soft tissue disorders: Secondary | ICD-10-CM

## 2023-03-12 LAB — BLOOD CULTURE ID PANEL (REFLEXED) - BCID2

## 2023-03-12 LAB — CBC WITH DIFFERENTIAL/PLATELET
Abs Immature Granulocytes: 0.65 10*3/uL — ABNORMAL HIGH (ref 0.00–0.07)
Basophils Absolute: 0.1 10*3/uL (ref 0.0–0.1)
Basophils Relative: 1 %
Eosinophils Absolute: 0.3 10*3/uL (ref 0.0–0.5)
Eosinophils Relative: 2 %
HCT: 26.3 % — ABNORMAL LOW (ref 39.0–52.0)
Hemoglobin: 8.6 g/dL — ABNORMAL LOW (ref 13.0–17.0)
Immature Granulocytes: 5 %
Lymphocytes Relative: 5 %
Lymphs Abs: 0.6 10*3/uL — ABNORMAL LOW (ref 0.7–4.0)
MCH: 30.8 pg (ref 26.0–34.0)
MCHC: 32.7 g/dL (ref 30.0–36.0)
MCV: 94.3 fL (ref 80.0–100.0)
Monocytes Absolute: 0.5 10*3/uL (ref 0.1–1.0)
Monocytes Relative: 4 %
Neutro Abs: 10.3 10*3/uL — ABNORMAL HIGH (ref 1.7–7.7)
Neutrophils Relative %: 83 %
Platelets: 249 10*3/uL (ref 150–400)
RBC: 2.79 MIL/uL — ABNORMAL LOW (ref 4.22–5.81)
RDW: 13.1 % (ref 11.5–15.5)
WBC Morphology: INCREASED
WBC: 12.3 10*3/uL — ABNORMAL HIGH (ref 4.0–10.5)
nRBC: 0 % (ref 0.0–0.2)

## 2023-03-12 LAB — URINE CULTURE: Culture: NO GROWTH

## 2023-03-12 LAB — BASIC METABOLIC PANEL
Anion gap: 11 (ref 5–15)
BUN: 25 mg/dL — ABNORMAL HIGH (ref 8–23)
CO2: 22 mmol/L (ref 22–32)
Calcium: 8.3 mg/dL — ABNORMAL LOW (ref 8.9–10.3)
Chloride: 92 mmol/L — ABNORMAL LOW (ref 98–111)
Creatinine, Ser: 1.38 mg/dL — ABNORMAL HIGH (ref 0.61–1.24)
GFR, Estimated: 51 mL/min — ABNORMAL LOW (ref >60)
Glucose, Bld: 117 mg/dL — ABNORMAL HIGH (ref 70–99)
Potassium: 4.6 mmol/L (ref 3.5–5.1)
Sodium: 125 mmol/L — ABNORMAL LOW (ref 135–145)

## 2023-03-12 LAB — MAGNESIUM: Magnesium: 1.9 mg/dL (ref 1.7–2.4)

## 2023-03-12 MED ORDER — CEFAZOLIN SODIUM-DEXTROSE 2-4 GM/100ML-% IV SOLN
2.0000 g | Freq: Three times a day (TID) | INTRAVENOUS | Status: DC
  Administered 2023-03-12 – 2023-03-16 (×13): 2 g via INTRAVENOUS
  Filled 2023-03-12 (×15): qty 100

## 2023-03-12 MED ORDER — SODIUM CHLORIDE 0.9% FLUSH
3.0000 mL | Freq: Two times a day (BID) | INTRAVENOUS | Status: DC
  Administered 2023-03-12 (×2): 3 mL via INTRAVENOUS

## 2023-03-12 MED ORDER — HYDROXYZINE HCL 10 MG PO TABS
10.0000 mg | ORAL_TABLET | Freq: Once | ORAL | Status: AC
  Administered 2023-03-12: 10 mg via ORAL
  Filled 2023-03-12: qty 1

## 2023-03-12 NOTE — Assessment & Plan Note (Signed)
-   Continue Flomax 

## 2023-03-12 NOTE — Assessment & Plan Note (Addendum)
Ultrasound ruled out DVT.  This is getting worse. - Follow-up echo - Start Lasix

## 2023-03-12 NOTE — Assessment & Plan Note (Addendum)
Cerebrovascular disease Coronary artery disease without angina - Continue Lipitor - Hold Plavix, plan to resume after TEE

## 2023-03-12 NOTE — TOC Initial Note (Signed)
Transition of Care [facility]) - Initial/Assessment Note    Patient Details  Name: Kenneth Montoya MRN: [id number] Date of Birth: March 18, 1941  [facility]) CM/SW Contact:    Lanier Clam, RN Phone Number: 03/12/2023, 2:34 PM  Clinical Narrative: follow for d/c needs.                  Expected Discharge Plan: Skilled Nursing Facility Barriers to Discharge: Continued Medical Work up   Patient Goals and CMS Choice Patient states their goals for this hospitalization and ongoing recovery are:: Rehab CMS Medicare.gov Compare Post Acute Care list provided to:: Patient   [place] ownership interest in [facility].provided to:: Patient    Expected Discharge Plan and Services   Discharge Planning Services: CM Consult   Living arrangements for the past 2 months: Single Family Home                                      Prior Living Arrangements/Services Living arrangements for the past 2 months: Single Family Home Lives with:: Self Patient language and need for interpreter reviewed:: Yes Do you feel safe going back to the place where you live?: Yes      Need for Family Participation in Patient Care: Yes (Comment) Care giver support system in place?: Yes (comment)   Criminal Activity/Legal Involvement Pertinent to Current Situation/Hospitalization: No - Comment as needed  Activities of Daily Living   ADL Screening (condition at time of admission) Independently performs ADLs?: Yes (appropriate for developmental age) Is the patient deaf or have difficulty hearing?: No Does the patient have difficulty seeing, even when wearing glasses/contacts?: No Does the patient have difficulty concentrating, remembering, or making decisions?: No  Permission Sought/Granted Permission sought to share information with : Case Manager Permission granted to share information with : Yes, Verbal Permission Granted  Share Information with NAME: Case manager            Emotional Assessment Appearance:: Appears stated age Attitude/Demeanor/Rapport: Gracious Affect (typically observed): Accepting Orientation: : Oriented to Self, Oriented to Place, Oriented to  Time, Oriented to Situation Alcohol / Substance Use: Not Applicable Psych Involvement: No (comment)  Admission diagnosis:  Panic attack [F41.0] Right hip pain [M25.551] Cellulitis of right lower extremity [L03.115] Closed right hip fracture, initial encounter (HCC) [S72.001A] Patient Active Problem List   Diagnosis Date Noted   Leg swelling 03/12/2023   Acute kidney injury 03/12/2023   PVD (peripheral vascular disease) (HCC) 03/12/2023   Normocytic anemia 03/12/2023   Transient hypoxia, resolved 03/11/2023   Closed right periprosthetic hip fracture, initial encounter (HCC) 03/11/2023   Acute metabolic encephalopathy 03/11/2023   MSSA bacteremia 03/11/2023   Adjustment disorder 03/11/2023   OA (osteoarthritis) of hip 02/12/2023   Osteoarthritis of right hip 02/12/2023   Lumbar stenosis 01/25/2022   TIA (transient ischemic attack) 01/18/2022   Allergic contact dermatitis due to drugs in contact with skin 10/09/2021   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/04/2021   SIADH (syndrome of inappropriate ADH production) (HCC) 01/10/2021   Hyponatremia 01/05/2021   Hyperlipidemia with target LDL less than 100 12/17/2020   Stroke (HCC) 12/09/2020   Cerebral infarction (HCC) 12/09/2020   Psychophysiological insomnia 11/21/2020   GAD (generalized anxiety disorder) 11/21/2020   BPH (benign prostatic hyperplasia)    Essential hypertension 11/08/2019   Gastroesophageal reflux disease without esophagitis 11/08/2019   Herpes simplex 11/08/2019   Primary erectile dysfunction  11/08/2019   Senile purpura (HCC) 05/13/2019   Chronic allergic rhinitis due to pollen 03/24/2017   LPRD (laryngopharyngeal reflux disease) 03/24/2017   Bladder outflow obstruction 03/04/2016   PCP:  Etta Grandchild, MD Pharmacy:    [facility] [phone] - [place], [place] - [address] AT [facility] [address] [facility] [address] [place] [address] Phone: [phone] Fax: [phone]  [facility] Delivery - [place], [place] - [address] [place] [address] Phone: [phone] Fax: [phone]     Social Determinants of Health (SDOH) Social History: SDOH Screenings   Food Insecurity: No Food Insecurity (03/11/2023)  Housing: Low Risk  (03/11/2023)  Transportation Needs: No Transportation Needs (03/11/2023)  Utilities: Not At Risk (03/11/2023)  Alcohol Screen: Low Risk  (03/04/2022)  Depression (PHQ2-9): Low Risk  (03/06/2023)  Financial Resource Strain: Low Risk  (03/06/2023)  Physical Activity: Inactive (03/06/2023)  Social Connections: Socially Integrated (03/06/2023)  Stress: No Stress Concern Present (03/06/2023)  Tobacco Use: Low Risk  (03/10/2023)  Health Literacy: Adequate Health Literacy (03/06/2023)   SDOH Interventions:     Readmission Risk Interventions     No data to display

## 2023-03-12 NOTE — Progress Notes (Signed)
Progress Note   Patient: Kenneth Montoya WRU:[id number] DOB: 26-Aug-1940 DOA: 03/10/2023     1 DOS: the patient was seen and examined on 03/12/2023 at 10:06 AM      Brief hospital course: Kenneth Montoya is an 82 y.o. M with hx HTN, CAD, TIA, PVD carotid, HLD, and SIADH with chronic hyponatremia and recent right THA who presented with weakness and panic attacks.  In the ER, found to have redness and swelling of his incision, WBC 17K and confusion.  Admitted on cefazolin.  Blood cultures subsequently grew MSSA.       Assessment and Plan: * MSSA bacteremia CT chest shows no pneumonia.  ?source as cellulitis.   - Continue cefazolin - TEE per ID - Repeat blood cultures - Defer UA/culture to ID    Closed right periprosthetic hip fracture, initial encounter (HCC) Superficial cellulitis at surgical site CT shows nondisplaced periprosthetic hip fracture.  Ortho recommend non-operative management.  The deep fluid collection is suspected to be normal post-operative change. - Consult Orthopedics - WBAT    Leg swelling Bilateral, worse on right.  Maybe due to infection.   - Obtain US DVT right - Possibly Lasix tomorrow pending renal function and Na  Normocytic anemia No clinical bleeding observed or reported.  Likely expected post-op plus compounded by MSSA bacteremia - Trend Hgb - Transfusion threshold 7 g/dL  PVD (peripheral vascular disease) (HCC) Cerebrovascular disease Coronary artery disease without angina - Continue Lipitor - Hold Plavix until plans for washout are clearer  Acute kidney injury Cr up to 1.4 today with fluids. - Hold further fluids - Check [name] legs - Possible diuresis tomorrow  Adjustment disorder Anxiety - Continue duloxetine  Acute metabolic encephalopathy Due to MSSA bacteremia.  Mild.  At baseline is oriented, alert.  Here is sleepy, tangential.  Transient hypoxia, resolved    Hyponatremia Urine sodium low, c/w dehydration or CHF.  Fluid  overloaded on exam. - Trend Na - HOld IV fluids   Essential hypertension BP normal - Continue Coreg - Hold losartan and HCT and amlodipine  Bladder outflow obstruction - Continue Flomax          Subjective: Patient feels tired, sleepy, not sleeping well, anxiety is better.  Hip pain is moderate to low, overall improving since surgery.  No other focal pains.  No fever, no myalgias.     Physical Exam: BP 129/68 (BP Location: Right Arm)   Pulse 92   Temp 97.8 F (36.6 C) (Oral)   Resp 16   Ht 5\' 8"  (1.727 m)   Wt 94.8 kg   SpO2 97%   BMI 31.78 kg/m   Elderly adult male, sitting up in recliner, appears sluggish and tired and inattentive, but mostly responding to questions Tachycardic, regular, no systolic murmur, pitting edema in the left leg up to the shin, firm pitting edema throughout the entire right leg, no JVD Respiratory rate seems slightly increased, overall lungs are diminished without rales or wheezes Abdomen soft no tenderness to palpation Response to questions but inattentive and tangential, oriented to self, hospital, but tires easily of questions, and seems to have memory impairment.  Upper extremity strength symmetric but weak.  Speech fluent    Data Reviewed: Discussed with infectious disease team Basic metabolic panel shows sodium 125, no change, creatinine up to 1.4 CBC shows hemoglobin down to 8.6 Blood culture growing MSSA    Family Communication: None present, will call this afternoon    Disposition: Status is: Inpatient  Author: Alberteen Sam, MD 03/12/2023 10:35 AM  For on call review [email or web address].

## 2023-03-12 NOTE — Assessment & Plan Note (Addendum)
Creatinine stable with Lasix -Daily BMP -Continue Lasix

## 2023-03-12 NOTE — Consult Note (Signed)
[facility]    Date of Admission:  03/10/2023   Total days of inpatient antibiotics 2        Reason for Consult: MSSA bacteremia    Principal Problem:   MSSA bacteremia Active Problems:   Bladder outflow obstruction   Essential hypertension   Hyponatremia   Transient hypoxia, resolved   Closed right periprosthetic hip fracture, initial encounter (HCC)   Acute metabolic encephalopathy   Adjustment disorder   Leg swelling   Acute kidney injury   PVD (peripheral vascular disease) (HCC)   Normocytic anemia   Assessment: 82 year old male admitted with: #MSSA bacteremia secondary to right hip PJI with abscess and cellulitis - Patient underwent index right hip arthroplasty for osteoarthritis on 02/12/2023 Dr. Antony Odea.  His postop course was complicated fall.  Reports drainage from hip after a fall.  Presented to the ED with increased drainage, WBC 17.3 K.  CT showed nondisplaced fracture with large fluid collection concerning for hematoma versus abscess. - Ortho was engaged and noted that fluid collection in question is consistent with postop changes. - Following this blood cultures grew MSSA.  ID was engaged Recommendations:  -Continue cefazolin - Repeat blood cultures tomorrow - Reengage orthopedics as patient is found to have MSSA bacteremia I suspect likely from right hip Microbiology:   Antibiotics:  Cefazolin 10/15-present Vancomycin 10/14 Cultures: Blood 10/15 once/2 MSSA Urine 10/15 no growth Other   HPI: Kenneth Montoya is a 82 y.o. male SIADH, hypertension, TIA, right total hip arthroplasty for osteoarthritis of right hip on 02/12/2023 with Dr. Antony Odea postop course was complicated by a fall.  Patient states that his wound started draining a few days later with increased pain.He reports he has not been on antibiotics prior to hospitalization.  Presented to the ED due to increased drainage.  CT showed changes of right hip blood  placement, nondisplaced fracture.  Large fluid collection laterally within the upper right quadriceps muscle could reflect hematoma or abscess.  Initially Ortho engaged and noted right hip open wound consistent with serous drainage and probable cellulitis.  ID engaged as patient found to have MSSA bacteremia.   Review of Systems: Review of Systems  All other systems reviewed and are negative.   Past Medical History:  Diagnosis Date   Anxiety    Arthritis    generalized   BPH (benign prostatic hyperplasia)    Cancer of skin, squamous cell    frozen    Cataract    bilateral sx   GERD (gastroesophageal reflux disease)    on meds   Hypertension    on meds   Shingles    SIADH (syndrome of inappropriate ADH production) (HCC)    Stroke (HCC)     Social History   Tobacco Use   Smoking status: Never   Smokeless tobacco: Never  Vaping Use   Vaping status: Never Used  Substance Use Topics   Alcohol use: Not Currently    Alcohol/week: 2.0 standard drinks of alcohol    Types: 2 Standard drinks or equivalent per week    Comment: wine 1-2 x a week and not every week    Drug use: Never    Family History  Problem Relation Age of Onset   Colon polyps Mother 6   Stroke Maternal Grandfather    Colon cancer Neg Hx    Esophageal cancer Neg Hx    Rectal cancer Neg Hx    Stomach cancer Neg  Hx    Scheduled Meds:  ascorbic acid  1,000 mg Oral QHS   atorvastatin  10 mg Oral Daily   carvedilol  6.25 mg Oral BID   DULoxetine  30 mg Oral Daily   enoxaparin (LOVENOX) injection  40 mg Subcutaneous Q24H   fluticasone  2 spray Each Nare Daily   loratadine  10 mg Oral Daily   olopatadine  1 drop Both Eyes QHS   pantoprazole  40 mg Oral Daily   psyllium  1 packet Oral QHS   sodium chloride flush  3 mL Intravenous Q12H   tamsulosin  0.4 mg Oral QHS   Continuous Infusions:   ceFAZolin (ANCEF) IV Stopped (03/12/23 1359)   PRN Meds:.acetaminophen **OR** acetaminophen, cyclobenzaprine,  fentaNYL (SUBLIMAZE) injection, HYDROcodone-acetaminophen, ondansetron **OR** ondansetron (ZOFRAN) IV Allergies  Allergen Reactions   Percocet [Oxycodone-Acetaminophen] Itching    OBJECTIVE: Blood pressure 129/68, pulse 92, temperature 97.8 F (36.6 C), temperature source Oral, resp. rate 16, height 5\' 8"  (1.727 m), weight 94.8 kg, SpO2 97%.  Physical Exam Constitutional:      General: He is not in acute distress.    Appearance: He is normal weight. He is not toxic-appearing.  HENT:     Head: Normocephalic and atraumatic.     Right Ear: External ear normal.     Left Ear: External ear normal.     Nose: No congestion or rhinorrhea.     Mouth/Throat:     Mouth: Mucous membranes are moist.     Pharynx: Oropharynx is clear.  Eyes:     Extraocular Movements: Extraocular movements intact.     Conjunctiva/sclera: Conjunctivae normal.     Pupils: Pupils are equal, round, and reactive to light.  Cardiovascular:     Rate and Rhythm: Normal rate and regular rhythm.     Heart sounds: No murmur heard.    No friction rub. No gallop.  Pulmonary:     Effort: Pulmonary effort is normal.     Breath sounds: Normal breath sounds.  Abdominal:     General: Abdomen is flat. Bowel sounds are normal.     Palpations: Abdomen is soft.  Musculoskeletal:        General: No swelling.     Cervical back: Normal range of motion and neck supple.     Comments: R hip wound with surrounding erythema and drainage  Skin:    General: Skin is warm and dry.  Neurological:     General: No focal deficit present.     Mental Status: He is oriented to person, place, and time.  Psychiatric:        Mood and Affect: Mood normal.     Lab Results Lab Results  Component Value Date   WBC 12.3 (H) 03/12/2023   HGB 8.6 (L) 03/12/2023   HCT 26.3 (L) 03/12/2023   MCV 94.3 03/12/2023   PLT 249 03/12/2023    Lab Results  Component Value Date   CREATININE 1.38 (H) 03/12/2023   BUN 25 (H) 03/12/2023   NA 125 (L)  03/12/2023   K 4.6 03/12/2023   CL 92 (L) 03/12/2023   CO2 22 03/12/2023    Lab Results  Component Value Date   ALT 21 03/10/2023   AST 25 03/10/2023   ALKPHOS 107 03/10/2023   BILITOT 1.8 (H) 03/10/2023       Danelle Earthly, MD [facility] 03/12/2023, 2:54 PM   I have personally spent 82 minutes involved in  face-to-face and non-face-to-face activities for this patient on the day of the visit. Professional time spent includes the following activities: Preparing to see the patient (review of tests), Obtaining and/or reviewing separately obtained history (admission/discharge record), Performing a medically appropriate examination and/or evaluation , Ordering medications/tests/procedures, referring and communicating with other health care professionals, Documenting clinical information in the EMR, Independently interpreting results (not separately reported), Communicating results to the patient/family/caregiver, Counseling and educating the patient/family/caregiver and Care coordination (not separately reported).

## 2023-03-12 NOTE — Progress Notes (Signed)
PHARMACY - PHYSICIAN COMMUNICATION CRITICAL VALUE ALERT - BLOOD CULTURE IDENTIFICATION (BCID)  Kenneth Montoya is an 82 y.o. male who presented to [facility] on 03/10/2023 with a chief complaint of cellulitis  Assessment:  1/3 staph aureus, no resistance  Name of physician (or Provider) Contacted: Johann Capers  Current antibiotics: ancef 1gm q8h  Changes to prescribed antibiotics recommended:  Ancef 2gm q8h  No results found for this or any previous visit.  Arley Phenix RPh 03/12/2023, 5:00 AM

## 2023-03-12 NOTE — Assessment & Plan Note (Addendum)
Anxiety - Try behavioral approaches - Hold duloxetine for hyponatremia

## 2023-03-12 NOTE — Progress Notes (Signed)
Subjective: Kenneth Montoya is well known to me from recent right THA. He was admitted yesterday with acute onset of cellulitis and drainage from his incision. He had no pain or prodromal symptoms. He had blood cultures positive for staph species. He reports no pain now and is not feeling ill    Objective: Vital signs in last 24 hours: Temp:  [97.6 F (36.4 C)-97.9 F (36.6 C)] 97.6 F (36.4 C) (10/16 1510) Pulse Rate:  [82-96] 84 (10/16 1510) Resp:  [16-18] 18 (10/16 1510) BP: (95-130)/(59-79) 130/59 (10/16 1510) SpO2:  [95 %-97 %] 96 % (10/16 1510)  Intake/Output from previous day: 10/15 0701 - 10/16 0700 In: 1666.1 [P.O.:720; I.V.:596.1; IV Piggyback:350] Out: 450 [Urine:450] Intake/Output this shift: Total I/O In: 1739.4 [P.O.:1160; I.V.:479.4; IV Piggyback:100] Out: -   Recent Labs    03/10/23 2336 03/11/23 0901 03/12/23 0504  HGB 10.3* 9.6* 8.6*   Recent Labs    03/11/23 0901 03/12/23 0504  WBC 14.1* 12.3*  RBC 3.07* 2.79*  HCT 29.2* 26.3*  PLT 212 249   Recent Labs    03/11/23 0901 03/12/23 0555  NA 124* 125*  K 4.4 4.6  CL 91* 92*  CO2 23 22  BUN 20 25*  CREATININE 0.99 1.38*  GLUCOSE 102* 117*  CALCIUM 8.5* 8.3*   No results for input(s): "LABPT", "INR" in the last 72 hours.  Neurologically intact Neurovascular intact Compartment soft He has drainage from a pinpoint open area superior part of his incision. There is slight surrounding erythema  When I push around the edges of the open area, I can express purulent fluid. If I move his hip or press more distally there is no expression of fluid. He has no pain on ROM of the hip    Assessment/Plan: He appears to have a wound infection which I feel is most likely in the subcutaneous space and not deep in the joint given his lack of pain and joint related symptoms. An acute PJI would be associated with significant pain and more prodromal symptoms. Given the drainage I have spoken with Kenneth Montoya about doing  a formal wound debridement and I can also determine if this involves the joint. We discussed this in detail and will plan on doing it tomorrow evening as he has already eaten today     Ollen Gross 03/12/2023, 4:35 PM

## 2023-03-12 NOTE — Hospital Course (Signed)
[name] is an 83 y.o. M with hx HTN, CAD, TIA, PVD carotid, HLD, and SIADH with chronic hyponatremia and recent right THA who presented with weakness and panic attacks.  In the ER, found to have redness and swelling of his incision, WBC 17K and confusion.  Admitted on cefazolin.  Blood cultures subsequently grew MSSA.

## 2023-03-12 NOTE — H&P (View-Only) (Signed)
Subjective: Kenneth Montoya is well known to me from recent right THA. He was admitted yesterday with acute onset of cellulitis and drainage from his incision. He had no pain or prodromal symptoms. He had blood cultures positive for staph species. He reports no pain now and is not feeling ill    Objective: Vital signs in last 24 hours: Temp:  [97.6 F (36.4 C)-97.9 F (36.6 C)] 97.6 F (36.4 C) (10/16 1510) Pulse Rate:  [82-96] 84 (10/16 1510) Resp:  [16-18] 18 (10/16 1510) BP: (95-130)/(59-79) 130/59 (10/16 1510) SpO2:  [95 %-97 %] 96 % (10/16 1510)  Intake/Output from previous day: 10/15 0701 - 10/16 0700 In: 1666.1 [P.O.:720; I.V.:596.1; IV Piggyback:350] Out: 450 [Urine:450] Intake/Output this shift: Total I/O In: 1739.4 [P.O.:1160; I.V.:479.4; IV Piggyback:100] Out: -   Recent Labs    03/10/23 2336 03/11/23 0901 03/12/23 0504  HGB 10.3* 9.6* 8.6*   Recent Labs    03/11/23 0901 03/12/23 0504  WBC 14.1* 12.3*  RBC 3.07* 2.79*  HCT 29.2* 26.3*  PLT 212 249   Recent Labs    03/11/23 0901 03/12/23 0555  NA 124* 125*  K 4.4 4.6  CL 91* 92*  CO2 23 22  BUN 20 25*  CREATININE 0.99 1.38*  GLUCOSE 102* 117*  CALCIUM 8.5* 8.3*   No results for input(s): "LABPT", "INR" in the last 72 hours.  Neurologically intact Neurovascular intact Compartment soft He has drainage from a pinpoint open area superior part of his incision. There is slight surrounding erythema  When I push around the edges of the open area, I can express purulent fluid. If I move his hip or press more distally there is no expression of fluid. He has no pain on ROM of the hip    Assessment/Plan: He appears to have a wound infection which I feel is most likely in the subcutaneous space and not deep in the joint given his lack of pain and joint related symptoms. An acute PJI would be associated with significant pain and more prodromal symptoms. Given the drainage I have spoken with Kenneth Montoya about doing  a formal wound debridement and I can also determine if this involves the joint. We discussed this in detail and will plan on doing it tomorrow evening as he has already eaten today     Ollen Gross 03/12/2023, 4:35 PM

## 2023-03-12 NOTE — Assessment & Plan Note (Signed)
Stable No clinical bleeding observed or reported.  Likely expected post-op plus compounded by MSSA bacteremia - Trend Hgb - Transfusion threshold 7 g/dL

## 2023-03-12 NOTE — Consult Note (Signed)
WOC Nurse Consult Note: Reason for Consult: right hip wound Wound type: surgical incision  Post op right hip replacement one month ago; presented with hip drainage  Pressure Injury POA: NA Measurement: see nursing flow sheets Wound EXB:[id number] skin loss with blistering and associated redness  Drainage (amount, consistency, odor) serous in the image, purulent drainage documented by nursing staff.  Periwound: redness; edema per orthopedic note Dressing procedure/placement/frequency: Cleanse right hip wound with saline, pat dry Apply single layer of xeroform gauze, top with foam Change daily while actively draining.  Orthopedics following for any surgical intervention needed.     Re consult if needed, will not follow at this time. Thanks  Joellyn Grandt [employer], RN,CWOCN, CNS, CWON-AP [phone])

## 2023-03-12 NOTE — Progress Notes (Signed)
   Subjective:    Patient seen in rounds for Dr. Lequita Halt. Patient reports he is doing well. Feels he is improving overall in terms of hip pain and anxiety which brought him to hospital. Denies fever or chills.  Objective: Vital signs in last 24 hours: Temp:  [97.8 F (36.6 C)-98.6 F (37 C)] 97.8 F (36.6 C) (10/16 0428) Pulse Rate:  [82-96] 92 (10/16 0833) Resp:  [16] 16 (10/16 0428) BP: (95-129)/(63-79) 129/68 (10/16 0833) SpO2:  [95 %-98 %] 97 % (10/16 0833)  Intake/Output from previous day:  Intake/Output Summary (Last 24 hours) at 03/12/2023 1159 Last data filed at 03/12/2023 1000 Gross per 24 hour  Intake 2670.43 ml  Output 450 ml  Net 2220.43 ml    Intake/Output this shift: Total I/O In: 1499.4 [P.O.:920; I.V.:479.4; IV Piggyback:100] Out: -   Labs: Recent Labs    03/10/23 2336 03/11/23 0901 03/12/23 0504  HGB 10.3* 9.6* 8.6*   Recent Labs    03/11/23 0901 03/12/23 0504  WBC 14.1* 12.3*  RBC 3.07* 2.79*  HCT 29.2* 26.3*  PLT 212 249   Recent Labs    03/11/23 0901 03/12/23 0555  NA 124* 125*  K 4.4 4.6  CL 91* 92*  CO2 23 22  BUN 20 25*  CREATININE 0.99 1.38*  GLUCOSE 102* 117*  CALCIUM 8.5* 8.3*   No results for input(s): "LABPT", "INR" in the last 72 hours.  Exam: General - Patient is Alert and Oriented Extremity - Neurologically intact Neurovascular intact Sensation intact distally Dorsiflexion/Plantar flexion intact Dressing/Incision - 1 cm opening at the proximal incision where the abdominal fold overlies. There is serous drainage present. Slight erythema at mid-distal incision. Motor Function - intact, moving foot and toes well on exam.  Past Medical History:  Diagnosis Date   Anxiety    Arthritis    generalized   BPH (benign prostatic hyperplasia)    Cancer of skin, squamous cell    frozen    Cataract    bilateral sx   GERD (gastroesophageal reflux disease)    on meds   Hypertension    on meds   Shingles    SIADH  (syndrome of inappropriate ADH production) (HCC)    Stroke (HCC)     Assessment/Plan:    Principal Problem:   MSSA bacteremia Active Problems:   Bladder outflow obstruction   Essential hypertension   Hyponatremia   Transient hypoxia, resolved   Closed right periprosthetic hip fracture, initial encounter (HCC)   Acute metabolic encephalopathy   Adjustment disorder   Leg swelling   Acute kidney injury   PVD (peripheral vascular disease) (HCC)   Normocytic anemia  Estimated body mass index is 31.78 kg/m as calculated from the following:   Height as of this encounter: 5\' 8"  (1.727 m).   Weight as of this encounter: 94.8 kg.  Weight-bearing as tolerated.  Keep wound area clean and dry. Daily dressing changes. Dr. Lequita Halt planning to see patient later today to evaluate need for continued abx.  R. Arcola Jansky, PA-C Orthopedic Surgery 03/12/2023, 11:59 AM

## 2023-03-12 NOTE — Plan of Care (Signed)
  Problem: Education: Goal: Knowledge of the prescribed therapeutic regimen will improve Outcome: Progressing Goal: Individualized Educational Video(s) Outcome: Not Applicable   Problem: Activity: Goal: Ability to avoid complications of mobility impairment will improve Outcome: Progressing Goal: Ability to tolerate increased activity will improve Outcome: Progressing   Problem: Pain Management: Goal: Pain level will decrease with appropriate interventions Outcome: Progressing   Problem: Skin Integrity: Goal: Will show signs of wound healing Outcome: Progressing   Problem: Education: Goal: Knowledge of General Education information will improve Description: Including pain rating scale, medication(s)/side effects and non-pharmacologic comfort measures Outcome: Progressing   Problem: Clinical Measurements: Goal: Ability to maintain clinical measurements within normal limits will improve Outcome: Progressing Goal: Will remain free from infection Outcome: Progressing Goal: Diagnostic test results will improve Outcome: Progressing Goal: Respiratory complications will improve Outcome: Progressing Goal: Cardiovascular complication will be avoided Outcome: Progressing

## 2023-03-12 NOTE — Progress Notes (Signed)
PT Cancellation Note  Patient Details Name: Kenneth Montoya MRN: [id number] DOB: August 15, 1940   Cancelled Treatment:    Reason Eval/Treat Not Completed: Other (comment)  Checked on pt twice.  Initially was eating and when returned in afternoon reports nauseated and does not feel like therapy.  He is sitting up in chair.  Will f/u as able. Anise Salvo, PT Acute Rehab [facility] [phone]  Rayetta Humphrey 03/12/2023, 1:53 PM

## 2023-03-12 NOTE — Progress Notes (Signed)
Right lower extremity venous duplex has been completed. Preliminary results can be found in CV Proc through chart review.   03/12/23 11:04 AM Olen Cordial RVT

## 2023-03-12 NOTE — Plan of Care (Signed)
  Problem: Education: Goal: Knowledge of the prescribed therapeutic regimen will improve Outcome: Progressing   Problem: Activity: Goal: Ability to avoid complications of mobility impairment will improve Outcome: Progressing   Problem: Pain Management: Goal: Pain level will decrease with appropriate interventions Outcome: Progressing   

## 2023-03-13 ENCOUNTER — Encounter (HOSPITAL_COMMUNITY)
Admission: EM | Disposition: A | Discharge status: Skilled Nursing Facility | Payer: Self-pay | Source: Home / Self Care | Attending: Family Medicine

## 2023-03-13 ENCOUNTER — Inpatient Hospital Stay (HOSPITAL_COMMUNITY): Payer: Medicare Other

## 2023-03-13 ENCOUNTER — Other Ambulatory Visit: Payer: Self-pay

## 2023-03-13 DIAGNOSIS — S72001A Fracture of unspecified part of neck of right femur, initial encounter for closed fracture: Secondary | ICD-10-CM | POA: Diagnosis not present

## 2023-03-13 DIAGNOSIS — T8149XA Infection following a procedure, other surgical site, initial encounter: Secondary | ICD-10-CM | POA: Diagnosis present

## 2023-03-13 DIAGNOSIS — N179 Acute kidney failure, unspecified: Secondary | ICD-10-CM | POA: Diagnosis not present

## 2023-03-13 DIAGNOSIS — T8142XA Infection following a procedure, deep incisional surgical site, initial encounter: Secondary | ICD-10-CM

## 2023-03-13 DIAGNOSIS — R7881 Bacteremia: Secondary | ICD-10-CM | POA: Diagnosis not present

## 2023-03-13 DIAGNOSIS — M7989 Other specified soft tissue disorders: Secondary | ICD-10-CM | POA: Diagnosis not present

## 2023-03-13 HISTORY — PX: HIP ARTHROPLASTY: SHX981

## 2023-03-13 LAB — CBC
HCT: 28.1 % — ABNORMAL LOW (ref 39.0–52.0)
Hemoglobin: 9.3 g/dL — ABNORMAL LOW (ref 13.0–17.0)
MCH: 30.9 pg (ref 26.0–34.0)
MCHC: 33.1 g/dL (ref 30.0–36.0)
MCV: 93.4 fL (ref 80.0–100.0)
Platelets: 220 10*3/uL (ref 150–400)
RBC: 3.01 MIL/uL — ABNORMAL LOW (ref 4.22–5.81)
RDW: 13.1 % (ref 11.5–15.5)
WBC: 15.6 10*3/uL — ABNORMAL HIGH (ref 4.0–10.5)
nRBC: 0 % (ref 0.0–0.2)

## 2023-03-13 LAB — COMPREHENSIVE METABOLIC PANEL
ALT: 27 U/L (ref 0–44)
AST: 39 U/L (ref 15–41)
Albumin: 2.8 g/dL — ABNORMAL LOW (ref 3.5–5.0)
Alkaline Phosphatase: 160 U/L — ABNORMAL HIGH (ref 38–126)
Anion gap: 8 (ref 5–15)
BUN: 28 mg/dL — ABNORMAL HIGH (ref 8–23)
CO2: 22 mmol/L (ref 22–32)
Calcium: 8.4 mg/dL — ABNORMAL LOW (ref 8.9–10.3)
Chloride: 89 mmol/L — ABNORMAL LOW (ref 98–111)
Creatinine, Ser: 1.33 mg/dL — ABNORMAL HIGH (ref 0.61–1.24)
GFR, Estimated: 53 mL/min — ABNORMAL LOW (ref >60)
Glucose, Bld: 120 mg/dL — ABNORMAL HIGH (ref 70–99)
Potassium: 4.7 mmol/L (ref 3.5–5.1)
Sodium: 119 mmol/L — CL (ref 135–145)
Total Bilirubin: 0.9 mg/dL (ref 0.3–1.2)
Total Protein: 6 g/dL — ABNORMAL LOW (ref 6.5–8.1)

## 2023-03-13 LAB — OSMOLALITY: Osmolality: 271 mosm/kg — ABNORMAL LOW (ref 275–295)

## 2023-03-13 SURGERY — HEMIARTHROPLASTY, HIP, DIRECT ANTERIOR APPROACH, FOR FRACTURE
Anesthesia: General | Site: Hip | Laterality: Right

## 2023-03-13 MED ORDER — FENTANYL CITRATE PF 50 MCG/ML IJ SOSY
PREFILLED_SYRINGE | INTRAMUSCULAR | Status: AC
  Filled 2023-03-13: qty 1

## 2023-03-13 MED ORDER — DROPERIDOL 2.5 MG/ML IJ SOLN: 0.6250 mg | Freq: Once | INTRAMUSCULAR | Status: DC | PRN

## 2023-03-13 MED ORDER — CEFAZOLIN SODIUM-DEXTROSE 2-4 GM/100ML-% IV SOLN: 2.0000 g | INTRAVENOUS | Status: DC

## 2023-03-13 MED ORDER — PROPOFOL 10 MG/ML IV BOLUS
INTRAVENOUS | Status: DC | PRN
Start: 2023-03-13 — End: 2023-03-13
  Administered 2023-03-13: 120 mg via INTRAVENOUS

## 2023-03-13 MED ORDER — SODIUM CHLORIDE 0.9% FLUSH
3.0000 mL | Freq: Two times a day (BID) | INTRAVENOUS | Status: DC
  Administered 2023-03-13 – 2023-03-18 (×9): 3 mL via INTRAVENOUS

## 2023-03-13 MED ORDER — PHENYLEPHRINE HCL (PRESSORS) 10 MG/ML IV SOLN
INTRAVENOUS | Status: DC | PRN
Start: 2023-03-13 — End: 2023-03-13
  Administered 2023-03-13: 80 ug via INTRAVENOUS
  Administered 2023-03-13: 160 ug via INTRAVENOUS
  Administered 2023-03-13: 80 ug via INTRAVENOUS
  Administered 2023-03-13: 160 ug via INTRAVENOUS

## 2023-03-13 MED ORDER — FENTANYL CITRATE PF 50 MCG/ML IJ SOSY
50.0000 ug | PREFILLED_SYRINGE | INTRAMUSCULAR | Status: DC
  Administered 2023-03-13: 50 ug via INTRAVENOUS

## 2023-03-13 MED ORDER — LIDOCAINE 2% (20 MG/ML) 5 ML SYRINGE
INTRAMUSCULAR | Status: DC | PRN
Start: 2023-03-13 — End: 2023-03-13
  Administered 2023-03-13: 100 mg via INTRAVENOUS

## 2023-03-13 MED ORDER — ONDANSETRON HCL 4 MG/2ML IJ SOLN
INTRAMUSCULAR | Status: DC | PRN
  Administered 2023-03-13: 4 mg via INTRAVENOUS

## 2023-03-13 MED ORDER — SODIUM CHLORIDE 0.9 % IR SOLN
Status: DC | PRN
  Administered 2023-03-13: 1000 mL

## 2023-03-13 MED ORDER — PHENYLEPHRINE HCL-NACL 20-0.9 MG/250ML-% IV SOLN
INTRAVENOUS | Status: DC | PRN
Start: 2023-03-13 — End: 2023-03-13
  Administered 2023-03-13: 25 ug/min via INTRAVENOUS

## 2023-03-13 MED ORDER — FENTANYL CITRATE PF 50 MCG/ML IJ SOSY: 25.0000 ug | PREFILLED_SYRINGE | INTRAMUSCULAR | Status: DC | PRN

## 2023-03-13 MED ORDER — FENTANYL CITRATE (PF) 100 MCG/2ML IJ SOLN
INTRAMUSCULAR | Status: DC | PRN
Start: 2023-03-13 — End: 2023-03-13
  Administered 2023-03-13 (×4): 25 ug via INTRAVENOUS

## 2023-03-13 MED ORDER — PROPOFOL 10 MG/ML IV BOLUS
INTRAVENOUS | Status: AC
  Filled 2023-03-13: qty 20

## 2023-03-13 MED ORDER — LACTATED RINGERS IV SOLN: INTRAVENOUS | Status: DC

## 2023-03-13 MED ORDER — LACTATED RINGERS IV SOLN
INTRAVENOUS | Status: DC | PRN
Start: 2023-03-13 — End: 2023-03-13

## 2023-03-13 MED ORDER — FENTANYL CITRATE (PF) 100 MCG/2ML IJ SOLN
INTRAMUSCULAR | Status: AC
  Filled 2023-03-13: qty 2

## 2023-03-13 MED ORDER — HYDROXYZINE HCL 10 MG PO TABS
10.0000 mg | ORAL_TABLET | Freq: Once | ORAL | Status: AC
  Administered 2023-03-13: 10 mg via ORAL
  Filled 2023-03-13: qty 1

## 2023-03-13 SURGICAL SUPPLY — 46 items
BAG COUNTER SPONGE SURGICOUNT (BAG) IMPLANT
BAG SPEC THK2 15X12 ZIP CLS (MISCELLANEOUS) ×1
BAG SPNG CNTER NS LX DISP (BAG)
BAG ZIPLOCK 12X15 (MISCELLANEOUS) ×2 IMPLANT
COVER SURGICAL LIGHT HANDLE (MISCELLANEOUS) ×2 IMPLANT
DRAPE INCISE IOBAN 66X45 STRL (DRAPES) ×2 IMPLANT
DRAPE ORTHO SPLIT 77X108 STRL (DRAPES) ×2
DRAPE SURG ORHT 6 SPLT 77X108 (DRAPES) ×4 IMPLANT
DRAPE U-SHAPE 47X51 STRL (DRAPES) ×2 IMPLANT
DRESSING MEPILEX FLEX 4X4 (GAUZE/BANDAGES/DRESSINGS) ×2 IMPLANT
DRSG ADAPTIC 3X8 NADH LF (GAUZE/BANDAGES/DRESSINGS) ×2 IMPLANT
DRSG AQUACEL AG ADV 3.5X10 (GAUZE/BANDAGES/DRESSINGS) IMPLANT
DRSG MEPILEX FLEX 4X4 (GAUZE/BANDAGES/DRESSINGS) ×1
DRSG MEPILEX POST OP 4X8 (GAUZE/BANDAGES/DRESSINGS) ×2 IMPLANT
DURAPREP 26ML APPLICATOR (WOUND CARE) ×2 IMPLANT
ELECT REM PT RETURN 15FT ADLT (MISCELLANEOUS) ×2 IMPLANT
EVACUATOR 1/8 PVC DRAIN (DRAIN) ×2 IMPLANT
GAUZE SPONGE 4X4 12PLY STRL (GAUZE/BANDAGES/DRESSINGS) ×2 IMPLANT
GLOVE BIO SURGEON STRL SZ 6 (GLOVE) ×2 IMPLANT
GLOVE BIO SURGEON STRL SZ 6.5 (GLOVE) ×2 IMPLANT
GLOVE BIO SURGEON STRL SZ7 (GLOVE) ×2 IMPLANT
GLOVE BIO SURGEON STRL SZ8 (GLOVE) ×2 IMPLANT
GLOVE BIOGEL PI IND STRL 6.5 (GLOVE) ×2 IMPLANT
GLOVE BIOGEL PI IND STRL 7.0 (GLOVE) ×2 IMPLANT
GLOVE BIOGEL PI IND STRL 8 (GLOVE) ×2 IMPLANT
GOWN STRL REUS W/ TWL LRG LVL3 (GOWN DISPOSABLE) ×4 IMPLANT
GOWN STRL REUS W/TWL LRG LVL3 (GOWN DISPOSABLE) ×2
HANDPIECE INTERPULSE COAX TIP (DISPOSABLE) ×1
KIT BASIN OR (CUSTOM PROCEDURE TRAY) ×2 IMPLANT
KIT TURNOVER KIT A (KITS) IMPLANT
MANIFOLD NEPTUNE II (INSTRUMENTS) ×2 IMPLANT
PACK TOTAL JOINT (CUSTOM PROCEDURE TRAY) ×2 IMPLANT
PROTECTOR NERVE ULNAR (MISCELLANEOUS) ×2 IMPLANT
SET HNDPC FAN SPRY TIP SCT (DISPOSABLE) ×2 IMPLANT
SOLUTION PRONTOSAN WOUND 350ML (IRRIGATION / IRRIGATOR) IMPLANT
STAPLER VISISTAT 35W (STAPLE) ×2 IMPLANT
SUT MNCRL AB 4-0 PS2 18 (SUTURE) ×2 IMPLANT
SUT STRATAFIX PDS+ 0 24IN (SUTURE) ×2 IMPLANT
SUT VIC AB 1 CT1 27 (SUTURE) ×3
SUT VIC AB 1 CT1 27XBRD ANTBC (SUTURE) ×6 IMPLANT
SUT VIC AB 2-0 CT1 27 (SUTURE) ×3
SUT VIC AB 2-0 CT1 TAPERPNT 27 (SUTURE) ×6 IMPLANT
SWAB COLLECTION DEVICE MRSA (MISCELLANEOUS) ×2 IMPLANT
SWAB CULTURE ESWAB REG 1ML (MISCELLANEOUS) ×2 IMPLANT
TOWEL OR 17X26 10 PK STRL BLUE (TOWEL DISPOSABLE) ×4 IMPLANT
YANKAUER SUCT BULB TIP 10FT TU (MISCELLANEOUS) IMPLANT

## 2023-03-13 NOTE — Progress Notes (Signed)
CROSS COVER NOTE  NAME: Kenneth Montoya MRN: [id number] DOB : 10-20-1940    Date of Service   03/13/2023   HPI/Events of Note   Nurse paged for serum sodium of 119.  Previous results was 125.  On chart review patient is already being monitored for hypovolemic hyponatremia with plans to diurese today.  Interventions   Plan: sent lab for serum osmolality. Interventions deferred to primary team given renal status and volume status       [name] Lamin Geradine Girt, MSN, APRN, AGACNP-BC [facility] [place] Pager: [phone]. Check Amion for Availability

## 2023-03-13 NOTE — Care Plan (Signed)
Called to daughter, no answer, left VM. ?

## 2023-03-13 NOTE — Progress Notes (Signed)
   Subjective:  Patient seen in rounds by Dr. Lequita Halt. Patient is well, and has had no acute complaints or problems. Denies fever or chills. Patient agreeable with plan for washout of wound this evening.  Objective: Vital signs in last 24 hours: Temp:  [97.6 F (36.4 C)-98.3 F (36.8 C)] 98.3 F (36.8 C) (10/17 0424) Pulse Rate:  [84-92] 88 (10/17 0424) Resp:  [18-20] 20 (10/17 0424) BP: (129-151)/(59-74) 151/74 (10/17 0424) SpO2:  [96 %-99 %] 99 % (10/17 0424)  Intake/Output from previous day:  Intake/Output Summary (Last 24 hours) at 03/13/2023 0751 Last data filed at 03/12/2023 1755 Gross per 24 hour  Intake 1739.44 ml  Output 350 ml  Net 1389.44 ml     Intake/Output this shift: No intake/output data recorded.  Labs: Recent Labs    03/10/23 2336 03/11/23 0901 03/12/23 0504 03/13/23 0501  HGB 10.3* 9.6* 8.6* 9.3*   Recent Labs    03/12/23 0504 03/13/23 0501  WBC 12.3* 15.6*  RBC 2.79* 3.01*  HCT 26.3* 28.1*  PLT 249 220   Recent Labs    03/12/23 0555 03/13/23 0501  NA 125* 119*  K 4.6 4.7  CL 92* 89*  CO2 22 22  BUN 25* 28*  CREATININE 1.38* 1.33*  GLUCOSE 117* 120*  CALCIUM 8.3* 8.4*   No results for input(s): "LABPT", "INR" in the last 72 hours.  Exam: General - Patient is Alert and Oriented Extremity - Neurologically intact Neurovascular intact Compartment soft He has drainage from a pinpoint open area superior part of his incision. There is slight surrounding erythema  When I push around the edges of the open area, I can express purulent fluid. If I move his hip or press more distally there is no expression of fluid. He has no pain on ROM of the hip Motor Function - intact, moving foot and toes well on exam.  Past Medical History:  Diagnosis Date   Anxiety    Arthritis    generalized   BPH (benign prostatic hyperplasia)    Cancer of skin, squamous cell    frozen    Cataract    bilateral sx   GERD (gastroesophageal reflux  disease)    on meds   Hypertension    on meds   Shingles    SIADH (syndrome of inappropriate ADH production) (HCC)    Stroke (HCC)     Assessment/Plan:   Procedure(s) (LRB): IRRIGATION AND DEBRIDEMENT RIGHT HIP (Right) Principal Problem:   MSSA bacteremia Active Problems:   Bladder outflow obstruction   Essential hypertension   Hyponatremia   Transient hypoxia, resolved   Closed right periprosthetic hip fracture, initial encounter (HCC)   Acute metabolic encephalopathy   Adjustment disorder   Leg swelling   Acute kidney injury   PVD (peripheral vascular disease) (HCC)   Normocytic anemia  Estimated body mass index is 31.78 kg/m as calculated from the following:   Height as of this encounter: 5\' 8"  (1.727 m).   Weight as of this encounter: 94.8 kg.  Na+ level 119 - appreciate hospitalist assistance in management of this.  NPO after breakfast this AM with plans to take to OR for I&D this evening.  R. Arcola Jansky, PA-C Orthopedic Surgery 03/13/2023, 7:51 AM

## 2023-03-13 NOTE — Anesthesia Procedure Notes (Signed)
Procedure Name: LMA Insertion Date/Time: 03/13/2023 5:27 PM  Performed by: Deri Fuelling, CRNAPre-anesthesia Checklist: Patient identified, Emergency Drugs available, Suction available, Patient being monitored and Timeout performed Patient Re-evaluated:Patient Re-evaluated prior to induction Oxygen Delivery Method: Circle system utilized Preoxygenation: Pre-oxygenation with 100% oxygen Induction Type: IV induction Ventilation: Mask ventilation without difficulty LMA: LMA with gastric port inserted LMA Size: 4.0 Number of attempts: 1 Tube secured with: Tape

## 2023-03-13 NOTE — Plan of Care (Signed)
  Problem: Pain Management: Goal: Pain level will decrease with appropriate interventions Outcome: Progressing   Problem: Skin Integrity: Goal: Will show signs of wound healing Outcome: Progressing   Problem: Clinical Measurements: Goal: Respiratory complications will improve Outcome: Progressing Goal: Cardiovascular complication will be avoided Outcome: Progressing   Problem: Elimination: Goal: Will not experience complications related to urinary retention Outcome: Progressing   Problem: Safety: Goal: Ability to remain free from injury will improve Outcome: Progressing

## 2023-03-13 NOTE — Transfer of Care (Signed)
Immediate Anesthesia Transfer of Care Note  Patient: Kenneth Montoya  Procedure(s) Performed: IRRIGATION AND DEBRIDEMENT RIGHT HIP (Right: Hip)  Patient Location: PACU  Anesthesia Type:General  Level of Consciousness: awake, alert , and oriented  Airway & Oxygen Therapy: Patient Spontanous Breathing and Patient connected to face mask oxygen  Post-op Assessment: Report given to RN  Post vital signs: Reviewed and stable  Last Vitals:  Vitals Value Taken Time  BP 135/60 03/13/23 1834  Temp    Pulse 81 03/13/23 1837  Resp 17 03/13/23 1837  SpO2 99 % 03/13/23 1837  Vitals shown include unfiled device data.  Last Pain:  Vitals:   03/13/23 1630  TempSrc:   PainSc: 5       Patients Stated Pain Goal: 5 (03/13/23 1548)  Complications: No notable events documented.

## 2023-03-13 NOTE — Evaluation (Addendum)
Physical Therapy Evaluation Patient Details Name: Kenneth Montoya MRN: [id number] DOB: 1940/07/13 Today's Date: 03/13/2023  History of Present Illness  Kenneth Montoya is an 82 y.o. M with hx HTN, CAD, TIA, PVD carotid, HLD, and SIADH with chronic hyponatremia and recent right THA  02/12/23 who presented 03/10/23  with weakness and panic attacks, I n the ER, found to have redness and swelling of his incisionwith drainage from  incision, and confusion.  CT: a nondisplaced fracture  extending from the superior posteromedial acetabulum superiorly into  the posterior iliac bone.     Large fluid collection laterally within the upper right quadriceps  muscle. This could reflect a postoperative intramuscular hematoma or  abscess.  Clinical Impression  Pt admitted with above diagnosis.  Pt currently with functional limitations due to the deficits listed below (see PT Problem List). Pt will benefit from acute skilled PT to increase their independence and safety with mobility to allow discharge.     The patient received seated on the bed edge. RN reports patient ambulated with staff earlier.  Patient stood at [facility], attempted taking a step, reporting pain in the right leg. Assisted  patient back into bed and elevated  edematous RLE. Patient is scheduled for I and D this date. Continue PT as indicated by surgeon post op. Family not present, patient not clear on  amount of assistance needed with mobility PTA. Will need to  iscuss with family post acute DC needs.      If plan is discharge home, recommend the following: A lot of help with bathing/dressing/bathroom;Assistance with cooking/housework;Assist for transportation;Direct supervision/assist for financial management   Can travel by private vehicle        Equipment Recommendations None recommended by PT  Recommendations for Other Services       Functional Status Assessment Patient has had a recent decline in their functional status and demonstrates the  ability to make significant improvements in function in a reasonable and predictable amount of time.     Precautions / Restrictions Precautions Precautions: Fall Precaution Comments: confusion(Na+ 119) Restrictions RLE Weight Bearing: Weight bearing as tolerated Other Position/Activity Restrictions: per ortho(acetab fracture)      Mobility  Bed Mobility Overal bed mobility: Needs Assistance Bed Mobility: Sit to Supine       Sit to supine: Mod assist   General bed mobility comments: assist both legs onto bed, able to sit and scoot  up towards bed edge    Transfers Overall transfer level: Needs assistance Equipment used: Rolling walker (2 wheels) Transfers: Sit to/from Stand Sit to Stand: Min assist           General transfer comment: Assist to stand, decreased WB on RLE    Ambulation/Gait Ambulation/Gait assistance: Mod assist   Assistive device: Rolling walker (2 wheels) Gait Pattern/deviations: Step-to pattern, Antalgic       General Gait Details: attempted to take a step forward, patient reported  too painful, attempted side step, same  Stairs            Wheelchair Mobility     Tilt Bed    Modified Rankin (Stroke Patients Only)       Balance Overall balance assessment: History of Falls, Needs assistance Sitting-balance support: No upper extremity supported, Feet supported Sitting balance-Leahy Scale: Good     Standing balance support: No upper extremity supported, Bilateral upper extremity supported, During functional activity Standing balance-Leahy Scale: Poor  Pertinent Vitals/Pain Pain Assessment Pain Assessment: 0-10 Faces Pain Scale: Hurts worst Pain Location: right hip and thigh Pain Descriptors / Indicators: Discomfort, Moaning Pain Intervention(s): Monitored during session, Repositioned    Home Living Family/patient expects to be discharged to:: Private residence Living  Arrangements: Spouse/significant other Available Help at Discharge: Family;Available PRN/intermittently Type of Home: House Home Access: Stairs to enter Entrance Stairs-Rails: None Entrance Stairs-Number of Steps: 1   Home Layout: One level Home Equipment: Agricultural consultant (2 wheels);Cane - single point;Grab bars - tub/shower;Grab bars - toilet Additional Comments: no family present    Prior Function               Mobility Comments: limited info from patient, reports ambulating with  [place], also reports had a trip and fall when leaving [place] a few weeks ago ADLs Comments: unsure, pt. not able to state     Extremity/Trunk Assessment   Upper Extremity Assessment Upper Extremity Assessment: Overall WFL for tasks assessed    Lower Extremity Assessment Lower Extremity Assessment: RLE deficits/detail;LLE deficits/detail RLE Deficits / Details: significant edema entire leg, esp thigh RLE Sensation: WNL LLE Deficits / Details: noted some edema       Communication   Communication Communication: Hearing impairment  Cognition Arousal: Alert   Overall Cognitive Status: No family/caregiver present to determine baseline cognitive functioning Area of Impairment: Orientation, Attention                 Orientation Level: Time Current Attention Level: Selective           General Comments: frequent cues and repeating   directions for mobility, asking what time  is surgery        General Comments      Exercises     Assessment/Plan    PT Assessment Patient needs continued PT services  PT Problem List Decreased strength;Decreased balance;Decreased cognition;Decreased knowledge of precautions;Decreased mobility;Decreased activity tolerance;Pain;Decreased skin integrity       PT Treatment Interventions DME instruction;Therapeutic activities;Cognitive remediation;Gait training;Therapeutic exercise;Patient/family education;Stair training;Functional mobility training     PT Goals (Current goals can be found in the Care Plan section)  Acute Rehab PT Goals Patient Stated Goal: go home PT Goal Formulation: With patient Time For Goal Achievement: 03/27/23    Frequency Min 1X/week     Co-evaluation               AM-PAC PT "6 Clicks" Mobility  Outcome Measure Help needed turning from your back to your side while in a flat bed without using bedrails?: A Lot Help needed moving from lying on your back to sitting on the side of a flat bed without using bedrails?: A Lot Help needed moving to and from a bed to a chair (including a wheelchair)?: A Lot Help needed standing up from a chair using your arms (e.g., wheelchair or bedside chair)?: A Lot Help needed to walk in hospital room?: Total Help needed climbing 3-5 steps with a railing? : Total 6 Click Score: 10    End of Session Equipment Utilized During Treatment: Gait belt Activity Tolerance: Patient limited by pain Patient left: in bed;with bed alarm set;with call bell/phone within reach Nurse Communication: Mobility status PT Visit Diagnosis: Unsteadiness on feet (R26.81);Muscle weakness (generalized) (M62.81);Difficulty in walking, not elsewhere classified (R26.2);Pain Pain - Right/Left: Right Pain - part of body: Leg    Time: 0920-0955 PT Time Calculation (min) (ACUTE ONLY): 35 min   Charges:   PT Evaluation $PT Eval Low Complexity: 1 Low  PT Treatments $Therapeutic Activity: 8-22 mins PT General Charges $$ ACUTE PT VISIT: 1 Visit         Blanchard Kelch PT Acute Rehabilitation Services Office [phone] Weekend [phone]  Rada Hay 03/13/2023, 10:07 AM

## 2023-03-13 NOTE — Anesthesia Postprocedure Evaluation (Signed)
Anesthesia Post Note  Patient: Kenneth Montoya  Procedure(s) Performed: IRRIGATION AND DEBRIDEMENT RIGHT HIP (Right: Hip)     Patient location during evaluation: PACU Anesthesia Type: General Level of consciousness: awake and alert Pain management: pain level controlled Vital Signs Assessment: post-procedure vital signs reviewed and stable Respiratory status: spontaneous breathing, nonlabored ventilation, respiratory function stable and patient connected to nasal cannula oxygen Cardiovascular status: blood pressure returned to baseline and stable Postop Assessment: no apparent nausea or vomiting Anesthetic complications: no   No notable events documented.  Last Vitals:  Vitals:   03/13/23 1835 03/13/23 1845  BP: 135/60 136/67  Pulse: 82 80  Resp: 14 17  Temp: 37.3 C   SpO2: 100% 100%    Last Pain:  Vitals:   03/13/23 1630  TempSrc:   PainSc: 5                  [place]

## 2023-03-13 NOTE — Interval H&P Note (Signed)
History and Physical Interval Note:  03/13/2023 5:13 PM  Kenneth Montoya  has presented today for surgery, with the diagnosis of RIGHT HIP WOUND INFECTION.  The various methods of treatment have been discussed with the patient and family. After consideration of risks, benefits and other options for treatment, the patient has consented to  Procedure(s): IRRIGATION AND DEBRIDEMENT RIGHT HIP (Right) as a surgical intervention.  The patient's history has been reviewed, patient examined, no change in status, stable for surgery.  I have reviewed the patient's chart and labs.  Questions were answered to the patient's satisfaction.     Homero Fellers [name]

## 2023-03-13 NOTE — Op Note (Signed)
NAME: CLEOFAS, HUDGINS MEDICAL RECORD NO: [id number] ACCOUNT NO: [id number] DATE OF BIRTH: Sep 28, 1940 FACILITY: [facility] LOCATION: [id number] PHYSICIAN: Gus Rankin. Dequon Schnebly, MD  Operative Report   DATE OF PROCEDURE: 03/13/2023  PREOPERATIVE DIAGNOSIS:  Right hip wound infection.  POSTOPERATIVE DIAGNOSIS:  Right hip wound infection.  PROCEDURE:  Irrigation and debridement, right hip.  SURGEON:  Gus Rankin. Holleigh Crihfield, MD  ASSISTANT:  No assistant.  ANESTHESIA:  General.  ESTIMATED BLOOD LOSS:  100.  DRAINS:  None.  COMPLICATIONS:  None.  CONDITION:  Stable to recovery.  BRIEF CLINICAL NOTE:  The patient is an 82 year old male who had a right total hip arthroplasty done approximately 4 weeks ago.  He was doing fine until this week when he developed some pinpoint wound drainage and was taken to the hospital and admitted  by the medical team.  Upon seeing him yesterday, I was able to express pus from the top of the incision, so we decided to perform irrigation and debridement for wound infection.  PROCEDURE IN DETAIL:  After successful administration of general anesthetic, the patient's feet were placed in the traction boots and he was placed on the Hana table, but no traction applied across the extremities.  His right thigh was isolated from his  perineum with plastic drapes and prepped and draped in the usual sterile fashion.  We utilized the previous incision, skin cut with a 10 blade through subcutaneous tissue.  There was a small pocket of purulence in the subcutaneous tissue.  The tissue was  debrided back to a normal-appearing tissue.  I then used a liter of pulsatile lavage to wash this area.  A fresh knife was then used to incise the fascia.  There was a pocket of purulent fluid under the fascia.  We thoroughly irrigated this with a liter  of saline, then 1 liter of Prontosan and then I changed gloves and inspected the deep tissues.  The joint capsule was closed, so it did not appear that the  infection had gone down into the joint.  This was very reassuring.  We then irrigated with  another liter of saline using pulsatile lavage.  The tissue itself was very healthy appearance, so this appeared to be very acute infection and there was no tissue damage.  We then closed the fascia with a running #1 Stratafix suture.  Subcutaneous was  then closed with interrupted 2-0 Vicryl and skin closed with staples.  Incision was cleaned and dried and a sterile dressing applied.  He was then awakened and transported to recovery in stable condition.  Note that the initial debridement was performed with a rongeur and curette in the subcutaneous tissue.  I then incised the fascia with a knife and the infection did not go into the joint as the capsule was closed.   NIK D: 03/13/2023 6:33:16 pm T: 03/13/2023 10:35:00 pm  JOB: [id number]/ [id number]

## 2023-03-13 NOTE — Progress Notes (Signed)
Progress Note   Patient: Kenneth Montoya WUJ:[id number] DOB: 1941/05/13 DOA: 03/10/2023     2 DOS: the patient was seen and examined on 03/13/2023 at 9:34AM      Brief hospital course: Mr. Billick is an 82 y.o. M with hx HTN, CAD, TIA, PVD carotid, HLD, and SIADH with chronic hyponatremia and recent right THA who presented with MSSA bacteremia.     Assessment and Plan: * MSSA bacteremia Urine no growth.  CT chest shows no pneumonia.  Hip incision with draining pus, concern that this is involved either superficially or deep.   - Consult ID - Consult Ortho, appreciate care - Continue cefazolin - Follow TTE - Repeat blood cultures this morning      Leg swelling [name] rules out DVT.  I suspect this is fluid overload. - Obtain Echo - Ortho request fluids preop, I will plan to diurese after surgery depending on hemodynamics      Acute on chronic hyponatremia   This is a slight perturbation from his baseline 125.  Fluid overloaded on exam. - Hold further fluids - I will plan to diurese post-op    Normocytic anemia Stable No clinical bleeding observed or reported.  Likely expected post-op plus compounded by MSSA bacteremia - Trend Hgb - Transfusion threshold 7 g/dL  Possible closed right periprosthetic hip fracture, initial encounter (HCC) Superficial cellulitis at surgical site - Consult Orthopedics - WBAT  PVD (peripheral vascular disease) (HCC) Cerebrovascular disease Coronary artery disease without angina - Continue Lipitor - Hold Plavix and aspirin for now  Acute kidney injury Cr 1.33 no change with fluids.  Suspect this is congestive nephropathy.  - Plan for Lasix postop    Adjustment disorder Anxiety - Continue duloxetine  Acute metabolic encephalopathy Due to MSSA bacteremia.  Mild.  At baseline is oriented, alert.  Here is sleepy, tangential.  Transient hypoxia, resolved    Essential hypertension BP 140s-150s  - Continue Coreg - Hold losartan and  HCT and amlodipine for now, plan for Lasix post op  Bladder outflow obstruction - Continue Flomax          Subjective: The patient is having some slight dyspnea and orthopnea and abdominal swelling today.  Otherwise he has moderate pain in the right hip, no fever overnight, no other changes.  His leg swelling is unchanged.  He is anxious and his memory is impaired.  Ortho plan to take to the OR today.  Echo pending.     Physical Exam: BP (!) 151/74 (BP Location: Right Arm)   Pulse 88   Temp 98.3 F (36.8 C) (Oral)   Resp 20   Ht 5\' 8"  (1.727 m)   Wt 94.8 kg   SpO2 99%   BMI 31.78 kg/m   Elderly adult male, sitting up in recliner, interactive, anxious Heart rate irregular, no murmur, pitting edema in the left leg to the midshin, firm pitting edema throughout the right leg again, no JVD Respiratory rate increased, overall lungs are diminished, no rales appreciated, no wheezing Abdomen distended, guarding diffusely but no tenderness Remains forgetful, inattentive, tangential but responds to questions, oriented to self and place and situation.     Data Reviewed: CBC shows white blood cell count up, hemoglobin stable Metabolic panel shows sodium slightly down to 119, creatinine 1.3, no change    Family Communication: None present, spoke with daughter last night, will call family again this afternoon    Disposition: Status is: Inpatient  Author: Alberteen Sam, MD 03/13/2023 12:30 PM  For on call review [email or web address].

## 2023-03-13 NOTE — Brief Op Note (Signed)
03/13/2023  6:29 PM  PATIENT:  [name]  82 y.o. male  PRE-OPERATIVE DIAGNOSIS:  RIGHT HIP WOUND INFECTION  POST-OPERATIVE DIAGNOSIS:  RIGHT HIP WOUND INFECTION  PROCEDURE:  Procedure(s): IRRIGATION AND DEBRIDEMENT RIGHT HIP (Right)  SURGEON:  Surgeons and Role:    Ollen Gross, MD - Primary  PHYSICIAN ASSISTANT:   ASSISTANTS: none   ANESTHESIA:   none  EBL:  150 mL   BLOOD ADMINISTERED:none  DRAINS: none   LOCAL MEDICATIONS USED:  NONE  COUNTS:  YES  TOURNIQUET:  * No tourniquets in log *  DICTATION: .Other Dictation: Dictation Number [id number]  PLAN OF CARE: Admit to inpatient   PATIENT DISPOSITION:  PACU - hemodynamically stable.

## 2023-03-13 NOTE — Progress Notes (Signed)
Updated [name]  (daughter) that pt is in short stay.

## 2023-03-13 NOTE — Anesthesia Preprocedure Evaluation (Signed)
Anesthesia Evaluation  Patient identified by MRN, date of birth, ID band Patient awake    Reviewed: Allergy & Precautions, H&P , NPO status , Patient's Chart, lab work & pertinent test results  Airway Mallampati: II  TM Distance: >3 FB Neck ROM: Full    Dental no notable dental hx.    Pulmonary neg pulmonary ROS   Pulmonary exam normal breath sounds clear to auscultation       Cardiovascular hypertension, + Peripheral Vascular Disease  Normal cardiovascular exam Rhythm:Regular Rate:Normal     Neuro/Psych  PSYCHIATRIC DISORDERS Anxiety     TIA Neuromuscular disease CVA    GI/Hepatic Neg liver ROS,GERD  ,,  Endo/Other  negative endocrine ROS    Renal/GU Renal disease  negative genitourinary   Musculoskeletal  (+) Arthritis ,    Abdominal   Peds negative pediatric ROS (+)  Hematology  (+) Blood dyscrasia, anemia   Anesthesia Other Findings   Reproductive/Obstetrics negative OB ROS                              Anesthesia Physical Anesthesia Plan  ASA: 3  Anesthesia Plan: General   Post-op Pain Management:    Induction: Intravenous  PONV Risk Score and Plan: Ondansetron  Airway Management Planned: LMA  Additional Equipment:   Intra-op Plan:   Post-operative Plan: Extubation in OR  Informed Consent: I have reviewed the patients History and Physical, chart, labs and discussed the procedure including the risks, benefits and alternatives for the proposed anesthesia with the patient or authorized representative who has indicated his/her understanding and acceptance.     Dental advisory given  Plan Discussed with: CRNA  Anesthesia Plan Comments: ([name] is an 82 y.o. M with hx HTN, CAD, TIA, PVD carotid, HLD, and SIADH with chronic hyponatremia and recent right THA who presented with weakness and panic attacks.)         Anesthesia Quick Evaluation

## 2023-03-14 ENCOUNTER — Encounter (HOSPITAL_COMMUNITY): Payer: Self-pay | Admitting: Orthopedic Surgery

## 2023-03-14 ENCOUNTER — Inpatient Hospital Stay (HOSPITAL_COMMUNITY): Payer: Medicare Other

## 2023-03-14 DIAGNOSIS — M7989 Other specified soft tissue disorders: Secondary | ICD-10-CM | POA: Diagnosis not present

## 2023-03-14 DIAGNOSIS — I38 Endocarditis, valve unspecified: Secondary | ICD-10-CM | POA: Diagnosis not present

## 2023-03-14 DIAGNOSIS — N320 Bladder-neck obstruction: Secondary | ICD-10-CM | POA: Diagnosis not present

## 2023-03-14 DIAGNOSIS — R7881 Bacteremia: Secondary | ICD-10-CM | POA: Diagnosis not present

## 2023-03-14 DIAGNOSIS — T8149XA Infection following a procedure, other surgical site, initial encounter: Secondary | ICD-10-CM

## 2023-03-14 DIAGNOSIS — S72001A Fracture of unspecified part of neck of right femur, initial encounter for closed fracture: Secondary | ICD-10-CM | POA: Diagnosis not present

## 2023-03-14 DIAGNOSIS — B9561 Methicillin susceptible Staphylococcus aureus infection as the cause of diseases classified elsewhere: Secondary | ICD-10-CM | POA: Diagnosis not present

## 2023-03-14 DIAGNOSIS — E669 Obesity, unspecified: Secondary | ICD-10-CM | POA: Insufficient documentation

## 2023-03-14 LAB — COMPREHENSIVE METABOLIC PANEL
ALT: 17 U/L (ref 0–44)
AST: 50 U/L — ABNORMAL HIGH (ref 15–41)
Albumin: 2.5 g/dL — ABNORMAL LOW (ref 3.5–5.0)
Alkaline Phosphatase: 211 U/L — ABNORMAL HIGH (ref 38–126)
Anion gap: 10 (ref 5–15)
BUN: 29 mg/dL — ABNORMAL HIGH (ref 8–23)
CO2: 20 mmol/L — ABNORMAL LOW (ref 22–32)
Calcium: 8 mg/dL — ABNORMAL LOW (ref 8.9–10.3)
Chloride: 87 mmol/L — ABNORMAL LOW (ref 98–111)
Creatinine, Ser: 1.4 mg/dL — ABNORMAL HIGH (ref 0.61–1.24)
GFR, Estimated: 50 mL/min — ABNORMAL LOW (ref >60)
Glucose, Bld: 91 mg/dL (ref 70–99)
Potassium: 5.1 mmol/L (ref 3.5–5.1)
Sodium: 117 mmol/L — CL (ref 135–145)
Total Bilirubin: 0.9 mg/dL (ref 0.3–1.2)
Total Protein: 5.4 g/dL — ABNORMAL LOW (ref 6.5–8.1)

## 2023-03-14 LAB — BLOOD CULTURE ID PANEL (REFLEXED) - BCID2

## 2023-03-14 LAB — CBC
HCT: 28.5 % — ABNORMAL LOW (ref 39.0–52.0)
Hemoglobin: 9 g/dL — ABNORMAL LOW (ref 13.0–17.0)
MCH: 30.6 pg (ref 26.0–34.0)
MCHC: 31.6 g/dL (ref 30.0–36.0)
MCV: 96.9 fL (ref 80.0–100.0)
Platelets: 209 10*3/uL (ref 150–400)
RBC: 2.94 MIL/uL — ABNORMAL LOW (ref 4.22–5.81)
RDW: 13.4 % (ref 11.5–15.5)
WBC: 14.9 10*3/uL — ABNORMAL HIGH (ref 4.0–10.5)
nRBC: 0 % (ref 0.0–0.2)

## 2023-03-14 LAB — CULTURE, BLOOD (ROUTINE X 2): Special Requests: ADEQUATE

## 2023-03-14 LAB — ECHOCARDIOGRAM COMPLETE
Area-P 1/2: 3.91 cm2
Calc EF: 42.6 %
S' Lateral: 3.5 cm
Single Plane A2C EF: 32.2 %
Single Plane A4C EF: 48.9 %

## 2023-03-14 MED ORDER — PERFLUTREN LIPID MICROSPHERE
1.0000 mL | INTRAVENOUS | Status: AC | PRN
  Administered 2023-03-14: 2 mL via INTRAVENOUS

## 2023-03-14 MED ORDER — FUROSEMIDE 10 MG/ML IJ SOLN
40.0000 mg | Freq: Once | INTRAMUSCULAR | Status: AC
  Administered 2023-03-14: 40 mg via INTRAVENOUS
  Filled 2023-03-14: qty 4

## 2023-03-14 MED ORDER — HYDROCODONE-ACETAMINOPHEN 5-325 MG PO TABS
1.0000 | ORAL_TABLET | Freq: Four times a day (QID) | ORAL | Status: DC | PRN
  Administered 2023-03-14 – 2023-03-18 (×13): 1 via ORAL
  Filled 2023-03-14 (×13): qty 1

## 2023-03-14 MED ORDER — ORAL CARE MOUTH RINSE: 15.0000 mL | OROMUCOSAL | Status: DC | PRN

## 2023-03-14 NOTE — Plan of Care (Signed)
  Problem: Education: Goal: Understanding of discharge needs will improve Outcome: Progressing   Problem: Education: Goal: Knowledge of General Education information will improve Description: Including pain rating scale, medication(s)/side effects and non-pharmacologic comfort measures Outcome: Progressing   Problem: Clinical Measurements: Goal: Respiratory complications will improve Outcome: Progressing   Problem: Nutrition: Goal: Adequate nutrition will be maintained Outcome: Progressing   Problem: Pain Managment: Goal: General experience of comfort will improve Outcome: Progressing

## 2023-03-14 NOTE — [place] FL2 (Signed)
[place] MEDICAID FL2 LEVEL OF CARE FORM     IDENTIFICATION  Patient Name: Kenneth Montoya Birthdate: January 31, 1941 Sex: male Admission Date (Current Location): 03/10/2023  [facility] and [place] Number:  Producer, television/film/video and Address:  [facility],  [address] [place], [place] [address]      Provider Number: [id number]  Attending Physician Name and Address:  Alberteen Sam, *  Relative Name and Phone Number:  [name] [id number](LOV)[phone]    Current Level of Care: Hospital Recommended Level of Care: Skilled Nursing Facility Prior Approval Number:    Date Approved/Denied:   PASRR Number: [id number]  Discharge Plan: SNF    Current Diagnoses: Patient Active Problem List   Diagnosis Date Noted   Obesity (BMI 30-39.9) 03/14/2023   Leg swelling 03/12/2023   Acute kidney injury 03/12/2023   PVD (peripheral vascular disease) (HCC) 03/12/2023   Normocytic anemia 03/12/2023   Transient hypoxia, resolved 03/11/2023   Possible closed right periprosthetic hip fracture, initial encounter (HCC) 03/11/2023   Acute metabolic encephalopathy 03/11/2023   MSSA bacteremia due to operative wound infection 03/11/2023   Adjustment disorder 03/11/2023   OA (osteoarthritis) of hip 02/12/2023   Osteoarthritis of right hip 02/12/2023   Lumbar stenosis 01/25/2022   TIA (transient ischemic attack) 01/18/2022   Allergic contact dermatitis due to drugs in contact with skin 10/09/2021   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/04/2021   SIADH (syndrome of inappropriate ADH production) (HCC) 01/10/2021   Hyponatremia 01/05/2021   Hyperlipidemia with target LDL less than 100 12/17/2020   Stroke (HCC) 12/09/2020   Cerebral infarction (HCC) 12/09/2020   Psychophysiological insomnia 11/21/2020   GAD (generalized anxiety disorder) 11/21/2020   BPH (benign prostatic hyperplasia)    Essential hypertension 11/08/2019   Gastroesophageal reflux disease without esophagitis 11/08/2019   Herpes  simplex 11/08/2019   Primary erectile dysfunction 11/08/2019   Senile purpura (HCC) 05/13/2019   Chronic allergic rhinitis due to pollen 03/24/2017   LPRD (laryngopharyngeal reflux disease) 03/24/2017   Bladder outflow obstruction 03/04/2016    Orientation RESPIRATION BLADDER Height & Weight     Self, Time, Situation, Place  O2 Continent Weight: 94.8 kg Height:  5\' 8"  (172.7 cm)  BEHAVIORAL SYMPTOMS/MOOD NEUROLOGICAL BOWEL NUTRITION STATUS      Continent Diet (Heart Healthy)  AMBULATORY STATUS COMMUNICATION OF NEEDS Skin   Limited Assist Verbally PU Stage and Appropriate Care, Surgical wounds (R hip wound see wound care on d/c summary)                       Personal Care Assistance Level of Assistance  Bathing, Feeding, Dressing Bathing Assistance: Limited assistance Feeding assistance: Limited assistance Dressing Assistance: Limited assistance     Functional Limitations Info  Sight, Hearing, Speech Sight Info: Impaired (eyeglasses) Hearing Info: Adequate Speech Info: Impaired (Dentures top/bottom)    SPECIAL CARE FACTORS FREQUENCY  PT (By licensed PT), OT (By licensed OT)     PT Frequency: 5x week OT Frequency: 5x week            Contractures Contractures Info: Not present    Additional Factors Info  Code Status, Allergies Code Status Info: Fulol Allergies Info: Percocet           Current Medications (03/14/2023):  This is the current hospital active medication list Current Facility-Administered Medications  Medication Dose Route Frequency Provider Last Rate Last Admin   acetaminophen (TYLENOL) tablet 650 mg  650 mg Oral Q6H PRN Ollen Gross, MD  Or   acetaminophen (TYLENOL) suppository 650 mg  650 mg Rectal Q6H PRN Aluisio, Homero Fellers, MD       ascorbic acid (VITAMIN C) tablet 1,000 mg  1,000 mg Oral QHS Aluisio, Homero Fellers, MD   1,000 mg at 03/13/23 2232   atorvastatin (LIPITOR) tablet 10 mg  10 mg Oral Daily Ollen Gross, MD   10 mg at 03/14/23  0958   carvedilol (COREG) tablet 6.25 mg  6.25 mg Oral BID Ollen Gross, MD   6.25 mg at 03/14/23 4098   ceFAZolin (ANCEF) IVPB 2g/100 mL premix  2 g Intravenous Q8H Aluisio, Homero Fellers, MD 200 mL/hr at 03/14/23 1318 2 g at 03/14/23 1318   cyclobenzaprine (FLEXERIL) tablet 10 mg  10 mg Oral TID PRN Ollen Gross, MD   10 mg at 03/14/23 1191   enoxaparin (LOVENOX) injection 40 mg  40 mg Subcutaneous Q24H Ollen Gross, MD   40 mg at 03/14/23 0958   fluticasone (FLONASE) 50 MCG/ACT nasal spray 2 spray  2 spray Each Nare Daily Ollen Gross, MD   2 spray at 03/14/23 0959   HYDROcodone-acetaminophen (NORCO/VICODIN) 5-325 MG per tablet 1 tablet  1 tablet Oral Q6H PRN Ollen Gross, MD   1 tablet at 03/14/23 1155   loratadine (CLARITIN) tablet 10 mg  10 mg Oral Daily Aluisio, Homero Fellers, MD   10 mg at 03/14/23 0958   olopatadine (PATANOL) 0.1 % ophthalmic solution 1 drop  1 drop Both Eyes QHS Aluisio, Homero Fellers, MD   1 drop at 03/13/23 2233   ondansetron (ZOFRAN) tablet 4 mg  4 mg Oral Q6H PRN Ollen Gross, MD       Or   ondansetron [employer]) injection 4 mg  4 mg Intravenous Q6H PRN Ollen Gross, MD   4 mg at 03/12/23 1330   Oral care mouth rinse  15 mL Mouth Rinse PRN Danford, Earl Lites, MD       pantoprazole (PROTONIX) EC tablet 40 mg  40 mg Oral Daily Ollen Gross, MD   40 mg at 03/14/23 0958   psyllium (HYDROCIL/METAMUCIL) 1 packet  1 packet Oral QHS Ollen Gross, MD   1 packet at 03/13/23 2234   sodium chloride flush (NS) 0.9 % injection 3 mL  3 mL Intravenous Q12H Ollen Gross, MD   3 mL at 03/14/23 0959   tamsulosin (FLOMAX) capsule 0.4 mg  0.4 mg Oral QHS Ollen Gross, MD   0.4 mg at 03/13/23 2232     Discharge Medications: Please see discharge summary for a list of discharge medications.  Relevant Imaging Results:  Relevant Lab Results:   Additional Information SS#[id number] [phone]  Glennie Bose, Olegario Messier, RN

## 2023-03-14 NOTE — Progress Notes (Signed)
[facility]  Date of Admission:  03/10/2023   Total days of inpatient antibiotics 3  Principal Problem:   Postoperative wound infection Active Problems:   Bladder outflow obstruction   Essential hypertension   Hyponatremia   Transient hypoxia, resolved   Possible closed right periprosthetic hip fracture, initial encounter (HCC)   Acute metabolic encephalopathy   MSSA bacteremia   Adjustment disorder   Leg swelling   Acute kidney injury   PVD (peripheral vascular disease) (HCC)   Normocytic anemia          Assessment: 82 year old male admitted with: #MSSA bacteremia secondary to right hip subfascial infection adjacent to right hip prosthetic joint - Patient underwent index right hip arthroplasty for osteoarthritis on 02/12/2023 Dr. Antony Odea.  His postop course was complicated fall.  Reports drainage from hip after a fall.  Presented to the ED with increased drainage, WBC 17.3 K.  CT showed nondisplaced fracture with large fluid collection concerning for hematoma versus abscess. - Ortho was engaged and noted that fluid collection in question is consistent with postop changes. - Following this blood cultures grew MSSA.  ID was engaged -Taken to OR on 10/17 for r hip wound debridement. Noted that pocket of fluid under the  fascia, joint capsule was closed "does not appear that infection had gone down into the joint.". Recommendations:  - Continue cefazolin - Repeat blood cultures on 10/16 ng so far - TTE pending, TEE ordered to get on the schedule as plan to get TEE even if TTE does not show vegetation. IF TEE is negative can do 3 weeks of antibiotics for deep infection/abscess from OR EOT 04/02/23 given capsule was intact.  Dr. Elinor Parkinson will be covering this weekend. New ID team on Monday. Microbiology:   Antibiotics: Cefazolin 10/15-present Vancomycin 10/14 Cultures: Blood 10/15 1/2 MSSA 10/16 NG Urine 10/15 no  growth   SUBJECTIVE: Sitting in chair. No new complaints.  Interval: Tmax 99.2 overnight. Wbc 14.9k  Review of Systems: Review of Systems  All other systems reviewed and are negative.    Scheduled Meds:  ascorbic acid  1,000 mg Oral QHS   atorvastatin  10 mg Oral Daily   carvedilol  6.25 mg Oral BID   enoxaparin (LOVENOX) injection  40 mg Subcutaneous Q24H   fluticasone  2 spray Each Nare Daily   loratadine  10 mg Oral Daily   olopatadine  1 drop Both Eyes QHS   pantoprazole  40 mg Oral Daily   psyllium  1 packet Oral QHS   sodium chloride flush  3 mL Intravenous Q12H   tamsulosin  0.4 mg Oral QHS   Continuous Infusions:   ceFAZolin (ANCEF) IV 2 g (03/14/23 0607)   PRN Meds:.acetaminophen **OR** acetaminophen, cyclobenzaprine, HYDROcodone-acetaminophen, ondansetron **OR** ondansetron (ZOFRAN) IV, mouth rinse Allergies  Allergen Reactions   Percocet [Oxycodone-Acetaminophen] Itching    OBJECTIVE: Vitals:   03/13/23 2000 03/14/23 0002 03/14/23 0418 03/14/23 0749  BP: 124/67 (!) 114/58 119/62 122/60  Pulse: 86 83 70 72  Resp: 18 18 16  (!) 22  Temp: 98.3 F (36.8 C) 97.6 F (36.4 C) 97.8 F (36.6 C) 98 F (36.7 C)  TempSrc: Oral Oral Oral Oral  SpO2: 100% 99% 99% 100%  Weight:      Height:       Body mass index is 31.78 kg/m.  Physical Exam Constitutional:      General: He is not in acute distress.  Appearance: He is normal weight. He is not toxic-appearing.  HENT:     Head: Normocephalic and atraumatic.     Right Ear: External ear normal.     Left Ear: External ear normal.     Nose: No congestion or rhinorrhea.     Mouth/Throat:     Mouth: Mucous membranes are moist.     Pharynx: Oropharynx is clear.  Eyes:     Extraocular Movements: Extraocular movements intact.     Conjunctiva/sclera: Conjunctivae normal.     Pupils: Pupils are equal, round, and reactive to light.  Cardiovascular:     Rate and Rhythm: Normal rate and regular rhythm.     Heart  sounds: No murmur heard.    No friction rub. No gallop.  Pulmonary:     Effort: Pulmonary effort is normal.     Breath sounds: Normal breath sounds.  Abdominal:     General: Abdomen is flat. Bowel sounds are normal.     Palpations: Abdomen is soft.  Musculoskeletal:        General: No swelling.     Cervical back: Normal range of motion and neck supple.     Comments: Right hip wound  Skin:    General: Skin is warm and dry.  Neurological:     General: No focal deficit present.     Mental Status: He is oriented to person, place, and time.  Psychiatric:        Mood and Affect: Mood normal.       Lab Results Lab Results  Component Value Date   WBC 14.9 (H) 03/14/2023   HGB 9.0 (L) 03/14/2023   HCT 28.5 (L) 03/14/2023   MCV 96.9 03/14/2023   PLT 209 03/14/2023    Lab Results  Component Value Date   CREATININE 1.40 (H) 03/14/2023   BUN 29 (H) 03/14/2023   NA 117 (LL) 03/14/2023   K 5.1 03/14/2023   CL 87 (L) 03/14/2023   CO2 20 (L) 03/14/2023    Lab Results  Component Value Date   ALT 17 03/14/2023   AST 50 (H) 03/14/2023   ALKPHOS 211 (H) 03/14/2023   BILITOT 0.9 03/14/2023        Danelle Earthly, MD [facility] 03/14/2023, 12:01 PM I have personally spent 52 minutes involved in face-to-face and non-face-to-face activities for this patient on the day of the visit. Professional time spent includes the following activities: Preparing to see the patient (review of tests), Obtaining and/or reviewing separately obtained history (admission/discharge record), Performing a medically appropriate examination and/or evaluation , Ordering medications/tests/procedures, referring and communicating with other health care professionals, Documenting clinical information in the EMR, Independently interpreting results (not separately reported), Communicating results to the patient/family/caregiver, Counseling and educating the  patient/family/caregiver and Care coordination (not separately reported).

## 2023-03-14 NOTE — Progress Notes (Signed)
PHARMACY - PHYSICIAN COMMUNICATION CRITICAL VALUE ALERT - BLOOD CULTURE IDENTIFICATION (BCID)  Kenneth Montoya is an 82 y.o. male who presented to [facility] on 03/10/2023 with a chief complaint of cellulitis. Patient was found to have MSSA bacteremia on 10/15.   Assessment:  1/2 repeat blood cultures growing GPC in chains. BCID negative.   Name of physician (or Provider) Contacted: Joen Laura, MD  Current antibiotics: cefazolin 2g q8hrs  Changes to prescribed antibiotics recommended:  Patient is on recommended antibiotics - No changes needed  Results for orders placed or performed during the hospital encounter of 03/10/23  Blood Culture ID Panel (Reflexed) (Collected: 03/13/2023  5:01 AM)  Result Value Ref Range   Enterococcus faecalis NOT DETECTED NOT DETECTED   Enterococcus Faecium NOT DETECTED NOT DETECTED   Listeria monocytogenes NOT DETECTED NOT DETECTED   Staphylococcus species NOT DETECTED NOT DETECTED   Staphylococcus aureus (BCID) NOT DETECTED NOT DETECTED   Staphylococcus epidermidis NOT DETECTED NOT DETECTED   Staphylococcus lugdunensis NOT DETECTED NOT DETECTED   Streptococcus species NOT DETECTED NOT DETECTED   Streptococcus agalactiae NOT DETECTED NOT DETECTED   Streptococcus pneumoniae NOT DETECTED NOT DETECTED   Streptococcus pyogenes NOT DETECTED NOT DETECTED   A.calcoaceticus-baumannii NOT DETECTED NOT DETECTED   Bacteroides fragilis NOT DETECTED NOT DETECTED   Enterobacterales NOT DETECTED NOT DETECTED   Enterobacter cloacae complex NOT DETECTED NOT DETECTED   Escherichia coli NOT DETECTED NOT DETECTED   Klebsiella aerogenes NOT DETECTED NOT DETECTED   Klebsiella oxytoca NOT DETECTED NOT DETECTED   Klebsiella pneumoniae NOT DETECTED NOT DETECTED   Proteus species NOT DETECTED NOT DETECTED   Salmonella species NOT DETECTED NOT DETECTED   Serratia marcescens NOT DETECTED NOT DETECTED   Haemophilus influenzae NOT DETECTED NOT DETECTED   Neisseria  meningitidis NOT DETECTED NOT DETECTED   Pseudomonas aeruginosa NOT DETECTED NOT DETECTED   Stenotrophomonas maltophilia NOT DETECTED NOT DETECTED   Candida albicans NOT DETECTED NOT DETECTED   Candida auris NOT DETECTED NOT DETECTED   Candida glabrata NOT DETECTED NOT DETECTED   Candida krusei NOT DETECTED NOT DETECTED   Candida parapsilosis NOT DETECTED NOT DETECTED   Candida tropicalis NOT DETECTED NOT DETECTED   Cryptococcus neoformans/gattii NOT DETECTED NOT DETECTED    Cherylin Mylar, PharmD Clinical Pharmacist  10/18/20241:41 PM

## 2023-03-14 NOTE — Evaluation (Signed)
Occupational Therapy Evaluation Patient Details Name: Kenneth Montoya MRN: [id number] DOB: [birth year] Today's Date: 03/14/2023   History of Present Illness [name] is an 82 y.o. M with hx HTN, CAD, TIA, PVD carotid, HLD, and SIADH with chronic hyponatremia and recent right THA  02/12/23 who presented 03/10/23  with weakness and panic attacks, I n the ER, found to have redness and swelling of his incisionwith drainage from  incision, and confusion.  CT: a nondisplaced fracture  extending from the superior posteromedial acetabulum superiorly into  the posterior iliac bone. Large fluid collection laterally within the upper right quadriceps  muscle. This could reflect a postoperative intramuscular hematoma or  abscess. Pt s/p I&D R hip 03/13/23.   Clinical Impression   Pt admitted with the above diagnosis. Pt currently with functional limitations due to the deficits listed below (see OT Problem List). Prior to admit, pt was living at home with his wife, completing ADL tasks and functional mobility at Mod I level. Pt demonstrated cognitive deficits at evaluation although unsure what his baseline cognition is. Patient will benefit from continued inpatient follow up therapy, <3 hours/day.  Pt will benefit from acute skilled OT to increase their safety and independence with ADL and functional mobility for ADL to facilitate discharge. OT will continue to follow him acutely.         If plan is discharge home, recommend the following: A lot of help with walking and/or transfers;A lot of help with bathing/dressing/bathroom;Assistance with cooking/housework;Help with stairs or ramp for entrance;Assist for transportation    Functional Status Assessment  Patient has had a recent decline in their functional status and demonstrates the ability to make significant improvements in function in a reasonable and predictable amount of time.  Equipment Recommendations  Other (comment) (defer to next venue of care)        Precautions / Restrictions Precautions Precautions: Fall Precaution Comments: confusion Restrictions Weight Bearing Restrictions: No RLE Weight Bearing: Weight bearing as tolerated Other Position/Activity Restrictions: per ortho(acetab fracture)      Mobility Bed Mobility Overal bed mobility: Needs Assistance Bed Mobility: Supine to Sit     Supine to sit: Mod assist, +2 for physical assistance, HOB elevated, Used rails     General bed mobility comments: VC and tatile cues required to sequence transition from supine to sitting EOB. Pt required direct directions. Physical assist provided to slide RLE off bed and 2 person assist to bring trunk up off HOB. Pt held onto rail or foot rest once seated for support.    Transfers Overall transfer level: Needs assistance Equipment used: Rolling walker (2 wheels) Transfers: Sit to/from Stand, Bed to chair/wheelchair/BSC Sit to Stand: Mod assist, +2 safety/equipment, From elevated surface     Step pivot transfers: Mod assist, +2 physical assistance     General transfer comment: mod A+2 to power up from elevated bed, +2 for safety for pivot to recliner, improved safety with pt able to maneuver RW more appropriately, cued to reach back for recliner for controlled descent into chair, increased time with transfer      Balance Overall balance assessment: History of Falls, Needs assistance Sitting-balance support: Feet supported, Single extremity supported Sitting balance-Leahy Scale: Poor Sitting balance - Comments: sitting EOB, pt preferred to hold onto footboard with single extremity while seated.   Standing balance support: Bilateral upper extremity supported, During functional activity, Reliant on assistive device for balance Standing balance-Leahy Scale: Poor      ADL either performed or assessed with  clinical judgement   ADL Overall ADL's : Needs assistance/impaired     Grooming: Oral care;Wash/dry face;Set up;Sitting    Upper Body Bathing: Set up;Sitting   Lower Body Bathing: Total assistance;Bed level   Upper Body Dressing : Set up;Sitting   Lower Body Dressing: Total assistance;Sit to/from stand   Toilet Transfer: Minimal assistance;+2 for safety/equipment;Cueing for sequencing;Stand-pivot;Rolling walker (2 wheels)   Toileting- Clothing Manipulation and Hygiene: Total assistance;Sit to/from stand         Vision Baseline Vision/History: 1 Wears glasses Ability to See in Adequate Light: 0 Adequate Patient Visual Report: No change from baseline Vision Assessment?: No apparent visual deficits     Perception Perception: Not tested       Praxis Praxis: Not tested       Pertinent Vitals/Pain Pain Assessment Pain Assessment: Faces Faces Pain Scale: Hurts whole lot Pain Location: R hip/leg Pain Descriptors / Indicators: Discomfort, Grimacing Pain Intervention(s): Monitored during session, Repositioned, Ice applied     Extremity/Trunk Assessment Upper Extremity Assessment Upper Extremity Assessment: Overall WFL for tasks assessed   Lower Extremity Assessment Lower Extremity Assessment: RLE deficits/detail RLE Deficits / Details: significant non-pitting edema from hip down to calf   Cervical / Trunk Assessment Cervical / Trunk Assessment: Normal   Communication Communication Communication: Hearing impairment Cueing Techniques: Verbal cues;Tactile cues   Cognition Arousal: Alert Behavior During Therapy: WFL for tasks assessed/performed Overall Cognitive Status: Impaired/Different from baseline Area of Impairment: Attention, Memory, Safety/judgement, Awareness, Problem solving, Orientation      Orientation Level: Time Current Attention Level: Focused Memory: Decreased recall of precautions, Decreased short-term memory   Safety/Judgement: Decreased awareness of deficits, Decreased awareness of safety   Problem Solving: Slow processing, Requires verbal cues, Requires tactile  cues General Comments: Required repeated instructions as needed due to hearing impairement. Pt demonstrates a short attention span and would start talking about topics unrelated to current activity/task     General Comments  [place] O2 removed for transfer. SpO2 in high 90's on RA. O2 replaced at end of session.            Home Living Family/patient expects to be discharged to:: Private residence Living Arrangements: Spouse/significant other (wife: Bonita Quin) Available Help at Discharge: Family;Available PRN/intermittently Type of Home: House Home Access: Stairs to enter [employer] of Steps: 1 Entrance Stairs-Rails: None Home Layout: One level     Bathroom Shower/Tub: Chief Strategy Officer: Handicapped height Bathroom Accessibility: Yes   Home Equipment: Agricultural consultant (2 wheels);Cane - single point;Grab bars - tub/shower;Grab bars - toilet   Additional Comments: Pt reports that he sleeps in a recliner at home.      Prior Functioning/Environment Prior Level of Function : Independent/Modified Independent;History of Falls (last six months)  Mobility Comments: uses RW to ambulate per patient. ADLs Comments: Pt reports completing without assist.        OT Problem List: Decreased coordination;Decreased cognition;Decreased activity tolerance;Decreased safety awareness;Decreased knowledge of use of DME or AE;Impaired balance (sitting and/or standing)      OT Treatment/Interventions: Self-care/ADL training;Therapeutic exercise;Therapeutic activities;Cognitive remediation/compensation;DME and/or AE instruction;Energy conservation;Patient/family education;Balance training;Manual therapy;Modalities    OT Goals(Current goals can be found in the care plan section) Acute Rehab OT Goals Patient Stated Goal: to get stronger OT Goal Formulation: Patient unable to participate in goal setting Time For Goal Achievement: 03/28/23 Potential to Achieve Goals: Good  OT  Frequency: Min 1X/week    Co-evaluation PT/OT/SLP Co-Evaluation/Treatment: Yes Reason for Co-Treatment: To address functional/ADL transfers;Necessary to  address cognition/behavior during functional activity PT goals addressed during session: Mobility/safety with mobility;Balance;Proper use of DME OT goals addressed during session: ADL's and self-care;Proper use of Adaptive equipment and DME;Strengthening/ROM      AM-PAC OT "6 Clicks" Daily Activity     Outcome Measure Help from another person eating meals?: A Little Help from another person taking care of personal grooming?: A Little Help from another person toileting, which includes using toliet, bedpan, or urinal?: Total Help from another person bathing (including washing, rinsing, drying)?: Total Help from another person to put on and taking off regular upper body clothing?: A Little Help from another person to put on and taking off regular lower body clothing?: Total 6 Click Score: 12   End of Session Equipment Utilized During Treatment: Gait belt;Rolling walker (2 wheels);Oxygen  Activity Tolerance: Patient tolerated treatment well Patient left: in chair;with call bell/phone within reach;with chair alarm set;with family/visitor present  OT Visit Diagnosis: Unsteadiness on feet (R26.81);History of falling (Z91.81);Muscle weakness (generalized) (M62.81)                Time: 1610-9604 OT Time Calculation (min): 30 min Charges:  OT General Charges $OT Visit: 1 Visit OT Evaluation $OT Eval Moderate Complexity: 1 Mod  [employer], OTR/L,CBIS  Supplemental OT - [facility] and [facility] Secure Chat Preferred    Mizael Sagar, Charisse March 03/14/2023, 4:21 PM

## 2023-03-14 NOTE — Assessment & Plan Note (Signed)
BMI 31 °

## 2023-03-14 NOTE — Plan of Care (Signed)
[facility] Tonsillectomy/Adenoidectomy, Postoperative PEDS care plan entered in error.

## 2023-03-14 NOTE — TOC Progression Note (Signed)
Transition of Care [facility]) - Progression Note    Patient Details  Name: Kenneth Montoya MRN: [id number] Date of Birth: 1940/06/27  Transition of Care [facility]) CM/SW Contact  Kelsee Preslar, Olegario Messier, RN Phone Number: 03/14/2023, 4:33 PM  Clinical Narrative:  Patient agreed to fax out await bed offers.     Expected Discharge Plan: Skilled Nursing Facility Barriers to Discharge: Continued Medical Work up  Expected Discharge Plan and Services   Discharge Planning Services: CM Consult   Living arrangements for the past 2 months: Single Family Home                                       Social Determinants of Health (SDOH) Interventions SDOH Screenings   Food Insecurity: No Food Insecurity (03/11/2023)  Housing: Low Risk  (03/11/2023)  Transportation Needs: No Transportation Needs (03/11/2023)  Utilities: Not At Risk (03/11/2023)  Alcohol Screen: Low Risk  (03/04/2022)  Depression (PHQ2-9): Low Risk  (03/06/2023)  Financial Resource Strain: Low Risk  (03/06/2023)  Physical Activity: Inactive (03/06/2023)  Social Connections: Socially Integrated (03/06/2023)  Stress: No Stress Concern Present (03/06/2023)  Tobacco Use: Low Risk  (03/10/2023)  Health Literacy: Adequate Health Literacy (03/06/2023)    Readmission Risk Interventions     No data to display

## 2023-03-14 NOTE — Progress Notes (Signed)
Progress Note   Patient: Kenneth Montoya:[id number] DOB: 1940/11/18 DOA: 03/10/2023     3 DOS: the patient was seen and examined on 03/14/2023 at 1124      Brief hospital course: Mr. Dyck is an 82 y.o. M with hx HTN, CAD, TIA, PVD carotid, HLD, and SIADH with chronic hyponatremia and recent right THA who presented with MSSA bacteremia from postoperative wound infection     Assessment and Plan: * MSSA bacteremia due to operative wound infection Source likely superficial cellulitis at surgical site.  Went to OR on 10/17 with Dr. Lequita Halt, debrided tubes fluid collections in the area of the incision, clinically did not appear to be involvement of the hip joint.   -Continue cefazolin - Follow-up TTE - Will need TEE if TTE is negative - Consult ID, appreciate expertise - Follow blood cultures      Acute metabolic encephalopathy Due to MSSA bacteremia, acute on chronic hyponatremia, opiates, superimposed on mild cognitive impairment probably incipient dementia. - Standard delirium precautions: blinds open and lights on during day, TV off, minimize interruptions at night, glasses/hearing aids, PT/OT, avoiding Beers list medications    Possible closed right periprosthetic hip fracture, initial encounter (HCC) Superficial cellulitis at surgical site CT suggests nondisplaced periprosthetic hip fracture.  - Consult Orthopedics - WBAT -PT/OT    Leg swelling Ultrasound ruled out DVT.  This is getting worse. - Follow-up echo - Start Lasix   Obesity (BMI 30-39.9) BMI 31  Normocytic anemia Stable No clinical bleeding observed or reported.  Likely expected post-op plus compounded by MSSA bacteremia - Trend Hgb - Transfusion threshold 7 g/dL  PVD (peripheral vascular disease) (HCC) Cerebrovascular disease Coronary artery disease without angina - Continue Lipitor - Hold Plavix and aspirin for now  Acute kidney injury Creatinine no change suspect this is congestive  nephropathy.  - IV Lasix and follow-up creatinine  Adjustment disorder Anxiety - Hold duloxetine for hyponatremia - Melatonin at night  Transient hypoxia, resolved    Hyponatremia Baseline is 125, so this is only a slight perturbation.  Very fluid overloaded on exam.   - Hold duloxetine - Start Lasix - Close trend Na - Fluid restriction  Essential hypertension BP 140s-150s  - Continue Coreg - Lasix - Hold losartan and hydrochlorothiazide and amlodipine for now    Bladder outflow obstruction - Continue Flomax          Subjective: Patient somewhat anxious and uncomfortable, the right hip there is no better, he has had no fever, he is anxious.  His breathing feels unchanged.  He has swelling in both legs.  Worse on the right.     Physical Exam: BP 119/62 (BP Location: Left Arm)   Pulse 78   Temp 98.1 F (36.7 C) (Oral)   Resp 17   Ht 5\' 8"  (1.727 m)   Wt 94.8 kg   SpO2 96%   BMI 31.78 kg/m   Obese elderly adult male, sitting in recliner, restless and fidgety Heart rate regular, no murmurs, pitting edema bilaterally, worse on the right, no JVD Respiratory rate increased, lungs diminished, no rales, no wheezing Abdomen without tenderness but guarding diffusely, distention noted Forgetful, inattentive, tangential, oriented to self and place when reoriented, speech fluent, generalized weakness   Data Reviewed: Operative note reviewed Discussed with infectious disease Patient metabolic panel shows creatinine unchanged White blood cell count 14, no change Sodium down to 117  Family Communication: Daughter by phone    Disposition: Status is: Inpatient  Author: Alberteen Sam, MD 03/14/2023 2:26 PM  For on call review [email or web address].

## 2023-03-14 NOTE — Progress Notes (Signed)
Physical Therapy Treatment Patient Details Name: Kenneth Montoya MRN: [id number] DOB: 04-01-1941 Today's Date: 03/14/2023   History of Present Illness Mr. Schwein is an 82 y.o. M with hx HTN, CAD, TIA, PVD carotid, HLD, and SIADH with chronic hyponatremia and recent right THA  02/12/23 who presented 03/10/23  with weakness and panic attacks, I n the ER, found to have redness and swelling of his incisionwith drainage from  incision, and confusion.  CT: a nondisplaced fracture  extending from the superior posteromedial acetabulum superiorly into  the posterior iliac bone. Large fluid collection laterally within the upper right quadriceps  muscle. This could reflect a postoperative intramuscular hematoma or  abscess. Pt s/p I&D R hip 03/13/23.    PT Comments  Pt supine in bed upon arrival, tolerates supine exercises with verbal cues for muscle activation and decreased momentum. Pt needing +2 to come to sitting EOB for assist with RLE and trunk management, pt using bedrail as able to upright into sitting. Pt reports sleeping in lift chair since 2013, so assisted back to recliner, educated on hand placement and RW management, pt with improved command following compared to previous treatment session, able to reach back for recliner for controlled descent to sitting and no LOB. Pt continues to grimace with high pain reports despite pain medication. Educated on ankle pumps while in elevated sitting due to increased LE swelling noted. Patient will benefit from continued inpatient follow up therapy, <3 hours/day.   If plan is discharge home, recommend the following: Assistance with cooking/housework;Assist for transportation;Direct supervision/assist for financial management;A lot of help with bathing/dressing/bathroom;Two people to help with walking and/or transfers   Can travel by private vehicle     No  Equipment Recommendations  None recommended by PT    Recommendations for Other Services        Precautions / Restrictions Precautions Precautions: Fall Precaution Comments: confusion Restrictions Weight Bearing Restrictions: No     Mobility  Bed Mobility Overal bed mobility: Needs Assistance Bed Mobility: Supine to Sit     Supine to sit: Mod assist, +2 for physical assistance    General bed mobility comments: assist to maneuver RLE to EOB, pt inching LLE towards EOB, cues to use bedrails and push with UE to upright trunk, ultimately mod A+2 for LE and trunk maangement to come to sitting EOB    Transfers Overall transfer level: Needs assistance Equipment used: Rolling walker (2 wheels) Transfers: Sit to/from Stand, Bed to chair/wheelchair/BSC Sit to Stand: Mod assist, +2 safety/equipment, From elevated surface   Step pivot transfers: Mod assist, +2 physical assistance       General transfer comment: mod A+2 to power up from elevated bed, +2 for safety for pivot to recliner, improved safety with pt able to maneuver RW more appropriately, cued to reach back for recliner for controlled descent into chair, increased time with transfer    Ambulation/Gait                   Stairs             Wheelchair Mobility     Tilt Bed    Modified Rankin (Stroke Patients Only)       Balance Overall balance assessment: History of Falls, Needs assistance Sitting-balance support: Feet supported, Single extremity supported Sitting balance-Leahy Scale: Fair     Standing balance support: Bilateral upper extremity supported, During functional activity, Reliant on assistive device for balance Standing balance-Leahy Scale: Poor  Cognition Arousal: Alert Behavior During Therapy: WFL for tasks assessed/performed Overall Cognitive Status: No family/caregiver present to determine baseline cognitive functioning                                 General Comments: pt states "I'm 3 steps behind" when being cued with  mobility and educated on therapeutic process. Pt cued and redirected wtih mobility        Exercises General Exercises - Lower Extremity Ankle Circles/Pumps: AROM, Strengthening, Both, 20 reps, Supine Quad Sets: AROM, Strengthening, Right, 10 reps, Supine Heel Slides: AAROM, Strengthening, Right, 5 reps, Supine Hip ABduction/ADduction: AAROM, Strengthening, Right, 5 reps, Supine    General Comments        Pertinent Vitals/Pain Pain Assessment Pain Assessment: Faces Faces Pain Scale: Hurts whole lot Pain Location: R hip/leg Pain Descriptors / Indicators: Discomfort, Grimacing Pain Intervention(s): Limited activity within patient's tolerance, Monitored during session, Repositioned, Ice applied    Home Living                          Prior Function            PT Goals (current goals can now be found in the care plan section) Acute Rehab PT Goals Patient Stated Goal: go home PT Goal Formulation: With patient Time For Goal Achievement: 03/27/23 Progress towards PT goals: Progressing toward goals    Frequency    Min 1X/week      PT Plan      Co-evaluation PT/OT/SLP Co-Evaluation/Treatment: Yes Reason for Co-Treatment: For patient/therapist safety;To address functional/ADL transfers PT goals addressed during session: Mobility/safety with mobility;Balance;Proper use of DME        AM-PAC PT "6 Clicks" Mobility   Outcome Measure  Help needed turning from your back to your side while in a flat bed without using bedrails?: A Lot Help needed moving from lying on your back to sitting on the side of a flat bed without using bedrails?: A Lot Help needed moving to and from a bed to a chair (including a wheelchair)?: A Lot Help needed standing up from a chair using your arms (e.g., wheelchair or bedside chair)?: A Lot Help needed to walk in hospital room?: Total Help needed climbing 3-5 steps with a railing? : Total 6 Click Score: 10    End of Session  Equipment Utilized During Treatment: Gait belt Activity Tolerance: Patient limited by pain;Patient tolerated treatment well Patient left: in chair;with call bell/phone within reach;with chair alarm set;with family/visitor present Nurse Communication: Mobility status PT Visit Diagnosis: Unsteadiness on feet (R26.81);Muscle weakness (generalized) (M62.81);Difficulty in walking, not elsewhere classified (R26.2);Pain Pain - Right/Left: Right Pain - part of body: Leg     Time: 1451-1525 PT Time Calculation (min) (ACUTE ONLY): 34 min  Charges:    $Therapeutic Exercise: 8-22 mins PT General Charges $$ ACUTE PT VISIT: 1 Visit                     Tori Citlali Gautney PT, DPT 03/14/23, 4:10 PM

## 2023-03-14 NOTE — Progress Notes (Addendum)
Physical Therapy Treatment Patient Details Name: Kenneth Montoya MRN: [id number] DOB: 1940/11/03 Today's Date: 03/14/2023   History of Present Illness Kenneth Montoya is an 82 y.o. M with hx HTN, CAD, TIA, PVD carotid, HLD, and SIADH with chronic hyponatremia and recent right THA  02/12/23 who presented 03/10/23  with weakness and panic attacks, I n the ER, found to have redness and swelling of his incisionwith drainage from  incision, and confusion.  CT: a nondisplaced fracture  extending from the superior posteromedial acetabulum superiorly into  the posterior iliac bone. Large fluid collection laterally within the upper right quadriceps  muscle. This could reflect a postoperative intramuscular hematoma or  abscess. Pt s/p I&D R hip 03/13/23.    PT Comments  Pt up in recliner upon arrival, questionable cognition with pt stating he is in [place] watching "the game" then laughing stating he is in hospital, but also unaware why he is in hospital or that he had surgery yesterday. Pt needing constant cues and education regarding sequencing, hand placement, DME maangement for step pivot transfer and bed mobility. Pt is impulsive with step pivot transfer, difficulty following commands, needing +2 for safety and physical assist, minimal RLE weightbearing, difficulty clearing each foot for step pivot, needing physical assist for controlled lowering to EOB to prevent fall with LOB. Assisted pt up in bed with +2assist using bedpad and bed assist. Pt requesting pain medication- RN notified. ECHO in room at [facility]; will continue to progress as able. Patient will benefit from continued inpatient follow up therapy, <3 hours/day.    If plan is discharge home, recommend the following: Assistance with cooking/housework;Assist for transportation;Direct supervision/assist for financial management;A lot of help with bathing/dressing/bathroom;Two people to help with walking and/or transfers   Can travel by private vehicle     No   Equipment Recommendations  None recommended by PT    Recommendations for Other Services       Precautions / Restrictions Precautions Precautions: Fall Precaution Comments: confusion Restrictions Weight Bearing Restrictions: No     Mobility  Bed Mobility Overal bed mobility: Needs Assistance Bed Mobility: Sit to Supine       Sit to supine: Max assist   General bed mobility comments: max A to lift BLE back into bed and reposition trunk, +2 with bed pad and bed assist to scoot pt up into bed    Transfers Overall transfer level: Needs assistance Equipment used: Rolling walker (2 wheels) Transfers: Sit to/from Stand, Bed to chair/wheelchair/BSC Sit to Stand: Mod assist, +2 safety/equipment   Step pivot transfers: Mod assist, +2 physical assistance, +2 safety/equipment       General transfer comment: mod A+2 to power up from recliner, verbal cues for shifting weight forward and engaging BLE, slow to rise and extend knees, pt impulsive with step pivot to bed, attempting to release RW, minimally clearing each foot taking ~1 inch shuffling steps forward, pt abruptly attempts to sit needing physical assist to safely lower to bed; maximum education regarding hand placement, sequencing, RW management and safety with mobility with questionable carryover    Ambulation/Gait                   Stairs             Wheelchair Mobility     Tilt Bed    Modified Rankin (Stroke Patients Only)       Balance Overall balance assessment: History of Falls, Needs assistance Sitting-balance support: Feet supported, Single extremity supported Sitting  balance-Leahy Scale: Fair     Standing balance support: Bilateral upper extremity supported, During functional activity, Reliant on assistive device for balance Standing balance-Leahy Scale: Poor                              Cognition Arousal: Alert Behavior During Therapy: Impulsive Overall Cognitive  Status: No family/caregiver present to determine baseline cognitive functioning                                 General Comments: Pt unaware of surgery yesterday, does endorse high pain. Pt easily distracted needing constant redirection. Pt not following commands appropriately despite cues.        Exercises      General Comments        Pertinent Vitals/Pain Pain Assessment Pain Assessment: Faces Faces Pain Scale: Hurts worst Pain Location: R hip Pain Descriptors / Indicators: Discomfort, Moaning Pain Intervention(s): Limited activity within patient's tolerance, Monitored during session, Premedicated before session, Repositioned    Home Living                          Prior Function            PT Goals (current goals can now be found in the care plan section) Acute Rehab PT Goals Patient Stated Goal: go home PT Goal Formulation: With patient Time For Goal Achievement: 03/27/23 Progress towards PT goals: Progressing toward goals    Frequency    Min 1X/week      PT Plan      Co-evaluation              AM-PAC PT "6 Clicks" Mobility   Outcome Measure  Help needed turning from your back to your side while in a flat bed without using bedrails?: A Lot Help needed moving from lying on your back to sitting on the side of a flat bed without using bedrails?: A Lot Help needed moving to and from a bed to a chair (including a wheelchair)?: Total Help needed standing up from a chair using your arms (e.g., wheelchair or bedside chair)?: A Lot Help needed to walk in hospital room?: Total Help needed climbing 3-5 steps with a railing? : Total 6 Click Score: 9    End of Session Equipment Utilized During Treatment: Gait belt Activity Tolerance: Patient limited by pain Patient left: in bed;with call bell/phone within reach;with bed alarm set;Other (comment) (ECHO in room) Nurse Communication: Mobility status;Patient requests pain meds PT Visit  Diagnosis: Unsteadiness on feet (R26.81);Muscle weakness (generalized) (M62.81);Difficulty in walking, not elsewhere classified (R26.2);Pain Pain - Right/Left: Right Pain - part of body: Leg     Time: 5643-3295 PT Time Calculation (min) (ACUTE ONLY): 11 min  Charges:    $Therapeutic Activity: 8-22 mins PT General Charges $$ ACUTE PT VISIT: 1 Visit                     Tori Haeley [place] PT, DPT 03/14/23, 2:32 PM

## 2023-03-14 NOTE — TOC Progression Note (Signed)
Transition of Care [facility]) - Progression Note    Patient Details  Name: Kenneth Montoya MRN: [id number] Date of Birth: [birth year]  Transition of Care [facility]) CM/SW Contact  Roderica Cathell, Olegario Messier, RN Phone Number: 03/14/2023, 1:03 PM  Clinical Narrative:[facility] rep Tresa Endo accepted for [facility] will folow for HHPT, may need nusing if long term iv abx, or wound care.       Expected Discharge Plan: Home w Home Health Services Barriers to Discharge: Continued Medical Work up  Expected Discharge Plan and Services   Discharge Planning Services: CM Consult   Living arrangements for the past 2 months: Single Family Home                                       Social Determinants of Health (SDOH) Interventions SDOH Screenings   Food Insecurity: No Food Insecurity (03/11/2023)  Housing: Low Risk  (03/11/2023)  Transportation Needs: No Transportation Needs (03/11/2023)  Utilities: Not At Risk (03/11/2023)  Alcohol Screen: Low Risk  (03/04/2022)  Depression (PHQ2-9): Low Risk  (03/06/2023)  Financial Resource Strain: Low Risk  (03/06/2023)  Physical Activity: Inactive (03/06/2023)  Social Connections: Socially Integrated (03/06/2023)  Stress: No Stress Concern Present (03/06/2023)  Tobacco Use: Low Risk  (03/10/2023)  Health Literacy: Adequate Health Literacy (03/06/2023)    Readmission Risk Interventions     No data to display

## 2023-03-14 NOTE — Progress Notes (Signed)
   Subjective: 1 Day Post-Op Procedure(s) (LRB): IRRIGATION AND DEBRIDEMENT RIGHT HIP (Right) Patient reports pain as mild.   Patient seen in rounds for Dr. Lequita Halt. Patient is doing well this AM, resting comfortably in recliner. Reports minimal hip pain. No issues overnight.   Objective: Vital signs in last 24 hours: Temp:  [97.6 F (36.4 C)-99.2 F (37.3 C)] 98 F (36.7 C) (10/18 0749) Pulse Rate:  [70-86] 72 (10/18 0749) Resp:  [14-22] 22 (10/18 0749) BP: (104-136)/(56-74) 122/60 (10/18 0749) SpO2:  [97 %-100 %] 100 % (10/18 0749)  Intake/Output from previous day:  Intake/Output Summary (Last 24 hours) at 03/14/2023 0757 Last data filed at 03/14/2023 0609 Gross per 24 hour  Intake 1750.85 ml  Output 650 ml  Net 1100.85 ml    Intake/Output this shift: No intake/output data recorded.  Labs: Recent Labs    03/11/23 0901 03/12/23 0504 03/13/23 0501 03/14/23 0502  HGB 9.6* 8.6* 9.3* 9.0*   Recent Labs    03/13/23 0501 03/14/23 0502  WBC 15.6* 14.9*  RBC 3.01* 2.94*  HCT 28.1* 28.5*  PLT 220 209   Recent Labs    03/13/23 0501 03/14/23 0502  NA 119* 117*  K 4.7 5.1  CL 89* 87*  CO2 22 20*  BUN 28* 29*  CREATININE 1.33* 1.40*  GLUCOSE 120* 91  CALCIUM 8.4* 8.0*   No results for input(s): "LABPT", "INR" in the last 72 hours.  Exam: General - Patient is Alert and Oriented Extremity - Neurologically intact Neurovascular intact Sensation intact distally Dorsiflexion/Plantar flexion intact Dressing/Incision - clean, dry, no drainage Motor Function - intact, moving foot and toes well on exam.   Past Medical History:  Diagnosis Date   Anxiety    Arthritis    generalized   BPH (benign prostatic hyperplasia)    Cancer of skin, squamous cell    frozen    Cataract    bilateral sx   GERD (gastroesophageal reflux disease)    on meds   Hypertension    on meds   Shingles    SIADH (syndrome of inappropriate ADH production) (HCC)    Stroke (HCC)      Assessment/Plan: 1 Day Post-Op Procedure(s) (LRB): IRRIGATION AND DEBRIDEMENT RIGHT HIP (Right) Principal Problem:   Postoperative wound infection Active Problems:   Bladder outflow obstruction   Essential hypertension   Hyponatremia   Transient hypoxia, resolved   Possible closed right periprosthetic hip fracture, initial encounter (HCC)   Acute metabolic encephalopathy   MSSA bacteremia   Adjustment disorder   Leg swelling   Acute kidney injury   PVD (peripheral vascular disease) (HCC)   Normocytic anemia  Estimated body mass index is 31.78 kg/m as calculated from the following:   Height as of this encounter: 5\' 8"  (1.727 m).   Weight as of this encounter: 94.8 kg. Up with therapy  DVT Prophylaxis - Lovenox Weight-bearing as tolerated  Infection isolated to subcutaneous tissues intra-op last night.  Continue abx per ID Dispo per medical team  Will need to follow-up in our office in one week for staple removal.  Arther Abbott, PA-C Orthopedic Surgery [phone] 03/14/2023, 7:57 AM

## 2023-03-15 DIAGNOSIS — N179 Acute kidney failure, unspecified: Secondary | ICD-10-CM | POA: Diagnosis not present

## 2023-03-15 DIAGNOSIS — R7881 Bacteremia: Secondary | ICD-10-CM | POA: Diagnosis not present

## 2023-03-15 DIAGNOSIS — S72001A Fracture of unspecified part of neck of right femur, initial encounter for closed fracture: Secondary | ICD-10-CM | POA: Diagnosis not present

## 2023-03-15 DIAGNOSIS — M7989 Other specified soft tissue disorders: Secondary | ICD-10-CM | POA: Diagnosis not present

## 2023-03-15 LAB — CBC
HCT: 27 % — ABNORMAL LOW (ref 39.0–52.0)
Hemoglobin: 8.9 g/dL — ABNORMAL LOW (ref 13.0–17.0)
MCH: 31.3 pg (ref 26.0–34.0)
MCHC: 33 g/dL (ref 30.0–36.0)
MCV: 95.1 fL (ref 80.0–100.0)
Platelets: 270 10*3/uL (ref 150–400)
RBC: 2.84 MIL/uL — ABNORMAL LOW (ref 4.22–5.81)
RDW: 13.5 % (ref 11.5–15.5)
WBC: 10.8 10*3/uL — ABNORMAL HIGH (ref 4.0–10.5)
nRBC: 0 % (ref 0.0–0.2)

## 2023-03-15 LAB — BASIC METABOLIC PANEL
Anion gap: 10 (ref 5–15)
BUN: 27 mg/dL — ABNORMAL HIGH (ref 8–23)
CO2: 24 mmol/L (ref 22–32)
Calcium: 8.4 mg/dL — ABNORMAL LOW (ref 8.9–10.3)
Chloride: 89 mmol/L — ABNORMAL LOW (ref 98–111)
Creatinine, Ser: 1.35 mg/dL — ABNORMAL HIGH (ref 0.61–1.24)
GFR, Estimated: 52 mL/min — ABNORMAL LOW (ref >60)
Glucose, Bld: 127 mg/dL — ABNORMAL HIGH (ref 70–99)
Potassium: 4.5 mmol/L (ref 3.5–5.1)
Sodium: 123 mmol/L — ABNORMAL LOW (ref 135–145)

## 2023-03-15 MED ORDER — POLYETHYLENE GLYCOL 3350 17 G PO PACK
17.0000 g | PACK | Freq: Two times a day (BID) | ORAL | Status: DC | PRN
  Administered 2023-03-15 – 2023-03-18 (×2): 17 g via ORAL
  Filled 2023-03-15 (×2): qty 1

## 2023-03-15 MED ORDER — FUROSEMIDE 10 MG/ML IJ SOLN
40.0000 mg | Freq: Once | INTRAMUSCULAR | Status: AC
  Administered 2023-03-15: 40 mg via INTRAVENOUS
  Filled 2023-03-15: qty 4

## 2023-03-15 MED ORDER — BISACODYL 10 MG RE SUPP
10.0000 mg | Freq: Every day | RECTAL | Status: DC | PRN
  Administered 2023-03-15: 10 mg via RECTAL
  Filled 2023-03-15: qty 1

## 2023-03-15 MED ORDER — SENNOSIDES-DOCUSATE SODIUM 8.6-50 MG PO TABS
1.0000 | ORAL_TABLET | Freq: Every evening | ORAL | Status: DC | PRN
  Administered 2023-03-17: 1 via ORAL
  Filled 2023-03-15 (×2): qty 1

## 2023-03-15 NOTE — TOC Progression Note (Signed)
Transition of Care [facility]) - Progression Note    Patient Details  Name: Kenneth Montoya MRN: [id number] Date of Birth: 02-Mar-1941  Transition of Care [facility]) CM/SW Contact  Georgie Chard, [place] Phone Number: 03/15/2023, 2:38 PM  Clinical Narrative:    CSW has presented bed offers to daughter. At this time daughter wants to review bed offers on [email or web address]. Daughter stated that she has to work today so she would be able to review tomorrow. At this time TOC will need to follow up with final decision.    Expected Discharge Plan: Skilled Nursing Facility Barriers to Discharge: Continued Medical Work up  Expected Discharge Plan and Services   Discharge Planning Services: CM Consult   Living arrangements for the past 2 months: Single Family Home                                       Social Determinants of Health (SDOH) Interventions SDOH Screenings   Food Insecurity: No Food Insecurity (03/11/2023)  Housing: Low Risk  (03/11/2023)  Transportation Needs: No Transportation Needs (03/11/2023)  Utilities: Not At Risk (03/11/2023)  Alcohol Screen: Low Risk  (03/04/2022)  Depression (PHQ2-9): Low Risk  (03/06/2023)  Financial Resource Strain: Low Risk  (03/06/2023)  Physical Activity: Inactive (03/06/2023)  Social Connections: Socially Integrated (03/06/2023)  Stress: No Stress Concern Present (03/06/2023)  Tobacco Use: Low Risk  (03/10/2023)  Health Literacy: Adequate Health Literacy (03/06/2023)    Readmission Risk Interventions     No data to display

## 2023-03-15 NOTE — Progress Notes (Signed)
Progress Note   Patient: Kenneth Montoya VHQ:[id number] DOB: 1940-08-26 DOA: 03/10/2023     4 DOS: the patient was seen and examined on 03/15/2023 at 10:21AM      Brief hospital course: Mr. Olivares is an 82 y.o. M with hx HTN, CAD, TIA, PVD carotid, HLD, and SIADH with chronic hyponatremia and recent right THA who presented with weakness and panic attacks.  In the ER, found to have redness and swelling of his incision, WBC 17K and confusion.  Admitted on cefazolin.  Blood cultures subsequently grew MSSA.       Assessment and Plan: * MSSA bacteremia due to operative wound infection Source likely superficial cellulitis at surgical site.  Went to OR on 10/17 with Dr. Lequita Halt, debrided tubes fluid collections in the area of the incision, clinically did not appear to be involvement of the hip joint.    TTE negative -Continue cefazolin - TEE pending - Consult ID, appreciate expertise    Acute metabolic encephalopathy Due to MSSA bacteremia, acute on chronic hyponatremia, opiates, superimposed on mild cognitive impairment probably incipient dementia. - Standard delirium precautions: blinds open and lights on during day, TV off, minimize interruptions at night, glasses/hearing aids, PT/OT, avoiding Beers list medications      Hyponatremia Sodium improved with Lasix - Hold duloxetine - Continue Lasix    Leg swelling - Continue Lasix  PVD (peripheral vascular disease) (HCC) Cerebrovascular disease Coronary artery disease without angina - Continue Lipitor - Hold Plavix - Resume aspirin  Acute kidney injury Creatinine stable with Lasix -Daily BMP -Continue Lasix  Adjustment disorder Anxiety - Hold duloxetine for hyponatremia - Melatonin at night  Essential hypertension Blood pressure controlled - Continue Coreg - Lasix - Hold losartan and hydrochlorothiazide and amlodipine for now    Bladder outflow obstruction - Continue Flomax          Subjective:  Patient remains anxious, restless, but confusion is improved.  No fever, no respiratory symptoms.  No concerns.     Physical Exam: BP 122/76 (BP Location: Right Arm)   Pulse 82   Temp 98.7 F (37.1 C)   Resp 16   Ht 5\' 8"  (1.727 m)   Wt 94.8 kg   SpO2 100%   BMI 31.78 kg/m   Elderly adult male, sitting up in recliner, fidgety RRR, no murmurs, no still with pitting edema in both lower extremities, worse on the right Respiratory rate normal, lungs clear without rales or wheezes Abdomen with some guarding but no focal tenderness or rigidity or rebound Attentive to questions, but scattered and forgetful, face symmetric, oriented to person, place, and time, strength in lower extremities severely limited by pain, unable to lift his right leg due to pain    Data Reviewed: Sodium up to 123, creatinine down to 1.35 Hemoglobin 8.9, no change  Family Communication: None today    Disposition: Status is: Inpatient         Author: Alberteen Sam, MD 03/15/2023 2:55 PM  For on call review [email or web address].

## 2023-03-15 NOTE — Progress Notes (Signed)
    Subjective: Patient seen in rounds for Dr. Lequita Halt.  Patient reports pain as mild to moderate.  Denies N/V/CP/SOB. Sitting in recliner.   Objective:   VITALS:   Vitals:   03/14/23 1222 03/14/23 2036 03/15/23 0534 03/15/23 1147  BP: 119/62 134/61 136/63 122/76  Pulse: 78 85 80 82  Resp: 17 20 18 16   Temp: 98.1 F (36.7 C) 98.3 F (36.8 C) 97.8 F (36.6 C) 98.7 F (37.1 C)  TempSrc: Oral Oral Oral   SpO2: 96% 96% 98% 100%  Weight:      Height:        NAD Neurologically intact ABD soft Neurovascular intact Sensation intact distally Intact pulses distally Dorsiflexion/Plantar flexion intact No cellulitis present Compartment soft Aquacel dressing saturated. New Aquacel dressing placed today.    Lab Results  Component Value Date   WBC 10.8 (H) 03/15/2023   HGB 8.9 (L) 03/15/2023   HCT 27.0 (L) 03/15/2023   MCV 95.1 03/15/2023   PLT 270 03/15/2023   BMET    Component Value Date/Time   NA 123 (L) 03/15/2023 0516   NA 133 (A) 09/01/2019 0000   K 4.5 03/15/2023 0516   CL 89 (L) 03/15/2023 0516   CO2 24 03/15/2023 0516   GLUCOSE 127 (H) 03/15/2023 0516   BUN 27 (H) 03/15/2023 0516   BUN 24 (A) 09/01/2019 0000   CREATININE 1.35 (H) 03/15/2023 0516   CALCIUM 8.4 (L) 03/15/2023 0516   GFRNONAA 52 (L) 03/15/2023 0516     Assessment/Plan: 2 Days Post-Op   Principal Problem:   MSSA bacteremia due to operative wound infection Active Problems:   Bladder outflow obstruction   Essential hypertension   Hyponatremia   Transient hypoxia, resolved   Possible closed right periprosthetic hip fracture, initial encounter (HCC)   Acute metabolic encephalopathy   Adjustment disorder   Leg swelling   Acute kidney injury   PVD (peripheral vascular disease) (HCC)   Normocytic anemia   Obesity (BMI 30-39.9)   WBAT with walker DVT ppx: Lovenox, SCDs, TEDS Continue abx per ID Dispo per medical team   Will need to follow-up in office in one week for staple  removal.   Clois Dupes, PA-C 03/15/2023, 2:51 PM   [facility]  [place] [address], [address], [place], [place] [address] Phone: [phone] [email or web address] Facebook  [employer]

## 2023-03-16 DIAGNOSIS — R7881 Bacteremia: Secondary | ICD-10-CM | POA: Diagnosis not present

## 2023-03-16 DIAGNOSIS — M7989 Other specified soft tissue disorders: Secondary | ICD-10-CM | POA: Diagnosis not present

## 2023-03-16 DIAGNOSIS — S72001A Fracture of unspecified part of neck of right femur, initial encounter for closed fracture: Secondary | ICD-10-CM | POA: Diagnosis not present

## 2023-03-16 DIAGNOSIS — N179 Acute kidney failure, unspecified: Secondary | ICD-10-CM | POA: Diagnosis not present

## 2023-03-16 LAB — CBC
HCT: 27.2 % — ABNORMAL LOW (ref 39.0–52.0)
Hemoglobin: 9 g/dL — ABNORMAL LOW (ref 13.0–17.0)
MCH: 30.6 pg (ref 26.0–34.0)
MCHC: 33.1 g/dL (ref 30.0–36.0)
MCV: 92.5 fL (ref 80.0–100.0)
Platelets: 283 10*3/uL (ref 150–400)
RBC: 2.94 MIL/uL — ABNORMAL LOW (ref 4.22–5.81)
RDW: 13.8 % (ref 11.5–15.5)
WBC: 10.5 10*3/uL (ref 4.0–10.5)
nRBC: 0 % (ref 0.0–0.2)

## 2023-03-16 LAB — CULTURE, BLOOD (ROUTINE X 2): Culture: NO GROWTH

## 2023-03-16 LAB — BASIC METABOLIC PANEL
Anion gap: 8 (ref 5–15)
BUN: 23 mg/dL (ref 8–23)
CO2: 27 mmol/L (ref 22–32)
Calcium: 8.5 mg/dL — ABNORMAL LOW (ref 8.9–10.3)
Chloride: 90 mmol/L — ABNORMAL LOW (ref 98–111)
Creatinine, Ser: 1 mg/dL (ref 0.61–1.24)
GFR, Estimated: >60 mL/min (ref >60)
Glucose, Bld: 110 mg/dL — ABNORMAL HIGH (ref 70–99)
Potassium: 4.2 mmol/L (ref 3.5–5.1)
Sodium: 125 mmol/L — ABNORMAL LOW (ref 135–145)

## 2023-03-16 MED ORDER — FUROSEMIDE 10 MG/ML IJ SOLN
40.0000 mg | Freq: Every day | INTRAMUSCULAR | Status: DC
  Administered 2023-03-16 – 2023-03-18 (×3): 40 mg via INTRAVENOUS
  Filled 2023-03-16 (×3): qty 4

## 2023-03-16 MED ORDER — SODIUM CHLORIDE 0.9 % IV SOLN
2.0000 g | Freq: Every day | INTRAVENOUS | Status: DC
  Administered 2023-03-16 – 2023-03-17 (×2): 2 g via INTRAVENOUS
  Filled 2023-03-16 (×2): qty 20

## 2023-03-16 NOTE — TOC Progression Note (Signed)
Transition of Care [facility]) - Progression Note    Patient Details  Name: ELAN OHORA MRN: [id number] Date of Birth: [birth year]  Transition [facility]) CM/SW Contact  Georgie Chard, LCSW Phone Number: 03/16/2023, 10:13 AM  Clinical Narrative:    CSW has reached out to daughter to speak about bed offers N/A left HIPAA V/M. TOC will continue to follow.    Expected Discharge Plan: Skilled Nursing Facility Barriers to Discharge: Continued Medical Work up  Expected Discharge Plan and Services   Discharge Planning Services: CM Consult   Living arrangements for the past 2 months: Single Family Home                                       Social Determinants of Health (SDOH) Interventions SDOH Screenings   Food Insecurity: No Food Insecurity (03/11/2023)  Housing: Low Risk  (03/11/2023)  Transportation Needs: No Transportation Needs (03/11/2023)  Utilities: Not At Risk (03/11/2023)  Alcohol Screen: Low Risk  (03/04/2022)  Depression (PHQ2-9): Low Risk  (03/06/2023)  Financial Resource Strain: Low Risk  (03/06/2023)  Physical Activity: Inactive (03/06/2023)  Social Connections: Socially Integrated (03/06/2023)  Stress: No Stress Concern Present (03/06/2023)  Tobacco Use: Low Risk  (03/10/2023)  Health Literacy: Adequate Health Literacy (03/06/2023)    Readmission Risk Interventions     No data to display

## 2023-03-16 NOTE — Progress Notes (Signed)
Progress Note   Patient: Kenneth Montoya [id number] DOB: Jul 17, 1940 DOA: 03/10/2023     5 DOS: the patient was seen and examined on 03/16/2023 at 9:35 AM      Brief hospital course: Mr. Carthan is an 82 y.o. M with hx HTN, CAD, TIA, PVD carotid, HLD, and SIADH with chronic hyponatremia and recent right THA who presented with weakness and panic attacks.  In the ER, found to have redness and swelling of his incision, WBC 17K and confusion.  Admitted on cefazolin.  Blood cultures subsequently grew MSSA.       Assessment and Plan: * MSSA bacteremia due to operative wound infection Source likely superficial cellulitis at surgical site.  Went to OR on 10/17 with Dr. Lequita Halt, debrided two fluid collections in the area of the incision, clinically did not appear to be involvement of the hip joint.    TTE negative  10/15 Blood culture x2: MSSA in 1/2 10/15 Urine culture: NG 10/17 surgical culture: MSSA 10/17 repeat blood culture x2: Abiotrophica defectiva in 1/2 10/18 repeat blood culture x2: NGTD  - Continue antibiotics - TEE pending - Consult ID, appreciate expertise    Acute metabolic encephalopathy Due to MSSA bacteremia, acute on chronic hyponatremia, opiates, superimposed on mild cognitive impairment probably incipient dementia. - Standard delirium precautions: blinds open and lights on during day, TV off, minimize interruptions at night, glasses/hearing aids, PT/OT, avoiding Beers list medications    Possible closed right periprosthetic hip fracture, initial encounter (HCC) Superficial cellulitis at surgical site CT suggests nondisplaced periprosthetic hip fracture.  - Consult Orthopedics - WBAT - PT/OT    Leg swelling - Continue Lasix  Obesity (BMI 30-39.9) BMI 31  Normocytic anemia Stable No clinical bleeding observed or reported.  Likely expected post-op plus compounded by MSSA bacteremia - Trend Hgb - Transfusion threshold 7 g/dL  PVD (peripheral  vascular disease) (HCC) Cerebrovascular disease Coronary artery disease without angina - Continue Lipitor - Hold Plavix, plan to resume after TEE   Acute kidney injury Cr 1.4 on admission, now resolved with diuresis. - Daily BMP - Continue Lasix  Adjustment disorder Anxiety - Try behavioral approaches - Hold duloxetine for hyponatremia   Transient hypoxia, resolved    Hyponatremia Resolved to baseline - Hold duloxetine - Continue Lasix   Essential hypertension Blood pressure controlled - Continue Coreg - Lasix - Hold losartan and hydrochlorothiazide and amlodipine for now    Bladder outflow obstruction - Continue Flomax          Subjective: Patient is feeling overall better, pain is somewhat less, but still present.  Nursing of no concerns.  Overall his encephalopathy appears better to me.     Physical Exam: BP 139/63   Pulse 92   Temp (!) 97.4 F (36.3 C) (Oral)   Resp 14   Ht 5\' 8"  (1.727 m)   Wt 94.8 kg   SpO2 95%   BMI 31.78 kg/m   Elderly adult male, sitting up in recliner, interactive and appropriate RRR, pitting and edema in both lower extremities, still worse on the right, no change from yesterday Respiratory rate normal, lungs clear without rales or wheezes Abdomen no guarding, no tenderness There is no significant redness around his right hip wound, although there is fair amount of bloody discharge on the dressing Attentive to questions, scattered and forgetful Oriented to person, place, and time, but tangential.  Strength in lower extremities limited by pain   Data Reviewed: Basic metabolic panel shows sodium up to  125, creatinine down to 1 CBC shows unchanged anemia  Family Communication:     Disposition: Status is: Inpatient         Author: Alberteen Sam, MD 03/16/2023 1:41 PM  For on call review [email or web address].

## 2023-03-16 NOTE — Progress Notes (Signed)
Subjective: 3 Days Post-Op Procedure(s) (LRB): IRRIGATION AND DEBRIDEMENT RIGHT HIP (Right) Patient reports pain as mild.    Objective: Vital signs in last 24 hours: Temp:  [98.1 F (36.7 C)-98.7 F (37.1 C)] 98.2 F (36.8 C) (10/20 0459) Pulse Rate:  [82-88] 88 (10/20 0459) Resp:  [16-20] 20 (10/20 0459) BP: (122-152)/(70-76) 152/76 (10/20 0459) SpO2:  [93 %-100 %] 93 % (10/20 0459)  Intake/Output from previous day: 10/19 0701 - 10/20 0700 In: 920 [P.O.:820; IV Piggyback:100] Out: 4650 [Urine:4650] Intake/Output this shift: No intake/output data recorded.  Recent Labs    03/14/23 0502 03/15/23 0516 03/16/23 0510  HGB 9.0* 8.9* 9.0*   Recent Labs    03/15/23 0516 03/16/23 0510  WBC 10.8* 10.5  RBC 2.84* 2.94*  HCT 27.0* 27.2*  PLT 270 283   Recent Labs    03/15/23 0516 03/16/23 0510  NA 123* 125*  K 4.5 4.2  CL 89* 90*  CO2 24 27  BUN 27* 23  CREATININE 1.35* 1.00  GLUCOSE 127* 110*  CALCIUM 8.4* 8.5*   No results for input(s): "LABPT", "INR" in the last 72 hours.  Neurologically intact ABD soft Neurovascular intact Sensation intact distally Intact pulses distally Dorsiflexion/Plantar flexion intact Incision: dressing C/D/I No cellulitis present Compartment soft No sign of DVT   Assessment/Plan: 3 Days Post-Op Procedure(s) (LRB): IRRIGATION AND DEBRIDEMENT RIGHT HIP (Right) Advance diet Up with therapy    Kenneth Montoya 03/16/2023, 9:00 AM

## 2023-03-16 NOTE — TOC Progression Note (Addendum)
Transition of Care [facility]) - Progression Note    Patient Details  Name: Kenneth Montoya MRN: [id number] Date of Birth: April 18, 1941  Transition of Care [facility]) CM/SW Contact  Georgie Chard, [place] Phone Number: 03/16/2023, 10:28 AM  Clinical Narrative:    CSW spoke to patient's daughter at this time daughter is still reviewing bed offers. Daughter would like TOC to ask [facility] and Rehab to review patient. TOC will continue to follow.   Addend@ 10:46 am   Daughter would also like [facility] to review patient as well in which this facility Is still pending. TOC will continue.    Expected Discharge Plan: Skilled Nursing Facility Barriers to Discharge: Continued Medical Work up  Expected Discharge Plan and Services   Discharge Planning Services: CM Consult   Living arrangements for the past 2 months: Single Family Home                                       Social Determinants of Health (SDOH) Interventions SDOH Screenings   Food Insecurity: No Food Insecurity (03/11/2023)  Housing: Low Risk  (03/11/2023)  Transportation Needs: No Transportation Needs (03/11/2023)  Utilities: Not At Risk (03/11/2023)  Alcohol Screen: Low Risk  (03/04/2022)  Depression (PHQ2-9): Low Risk  (03/06/2023)  Financial Resource Strain: Low Risk  (03/06/2023)  Physical Activity: Inactive (03/06/2023)  Social Connections: Socially Integrated (03/06/2023)  Stress: No Stress Concern Present (03/06/2023)  Tobacco Use: Low Risk  (03/10/2023)  Health Literacy: Adequate Health Literacy (03/06/2023)    Readmission Risk Interventions     No data to display

## 2023-03-16 NOTE — Plan of Care (Signed)
  Problem: Education: Goal: Knowledge of the prescribed therapeutic regimen will improve Outcome: Progressing   Problem: Education: Goal: Understanding of discharge needs will improve Outcome: Progressing

## 2023-03-16 NOTE — Progress Notes (Signed)
ID brief note ( weekend coverage)   10/15 1/2 sets MSSA  10/17 1/2 sets abiotrophica defectiva 10/18 NG in 2 days   10/17 rt wound cx staph aureus susc pending    10/18 TTE, very limited, no vegetations or endocarditis   Likely culprit here is MSSA give + wound and blood cx. However, will change cefazolin to IV ceftriaxone to cover MSSA and abiotrophica for now. Unclear significance of abiotrophica given isolation in only one bottle among multiple cultures  but has potential to cause endocarditis. Fu repeat blood cx 10/18 to see any growth and may consider changing back to cefazolin.   TTE has been already ordered.  Monitor CBC and BMP Dr Renold Don to follow starting 10/21  Odette Fraction, MD Infectious Disease Physician [facility] [address] [place], [place] [address] Phone: [phone]  Fax: [phone]

## 2023-03-17 ENCOUNTER — Other Ambulatory Visit: Payer: Self-pay

## 2023-03-17 DIAGNOSIS — N320 Bladder-neck obstruction: Secondary | ICD-10-CM | POA: Diagnosis not present

## 2023-03-17 DIAGNOSIS — T8149XA Infection following a procedure, other surgical site, initial encounter: Secondary | ICD-10-CM | POA: Diagnosis not present

## 2023-03-17 DIAGNOSIS — R7881 Bacteremia: Secondary | ICD-10-CM | POA: Diagnosis not present

## 2023-03-17 DIAGNOSIS — M7989 Other specified soft tissue disorders: Secondary | ICD-10-CM | POA: Diagnosis not present

## 2023-03-17 DIAGNOSIS — S72001A Fracture of unspecified part of neck of right femur, initial encounter for closed fracture: Secondary | ICD-10-CM | POA: Diagnosis not present

## 2023-03-17 DIAGNOSIS — B9561 Methicillin susceptible Staphylococcus aureus infection as the cause of diseases classified elsewhere: Secondary | ICD-10-CM | POA: Diagnosis not present

## 2023-03-17 LAB — BASIC METABOLIC PANEL
Anion gap: 12 (ref 5–15)
BUN: 23 mg/dL (ref 8–23)
CO2: 25 mmol/L (ref 22–32)
Calcium: 8.3 mg/dL — ABNORMAL LOW (ref 8.9–10.3)
Chloride: 84 mmol/L — ABNORMAL LOW (ref 98–111)
Creatinine, Ser: 1.24 mg/dL (ref 0.61–1.24)
GFR, Estimated: 58 mL/min — ABNORMAL LOW (ref >60)
Glucose, Bld: 112 mg/dL — ABNORMAL HIGH (ref 70–99)
Potassium: 4.1 mmol/L (ref 3.5–5.1)
Sodium: 121 mmol/L — ABNORMAL LOW (ref 135–145)

## 2023-03-17 MED ORDER — SODIUM CHLORIDE 0.9% FLUSH: 10.0000 mL | INTRAVENOUS | Status: DC | PRN

## 2023-03-17 MED ORDER — AMLODIPINE BESYLATE 5 MG PO TABS
5.0000 mg | ORAL_TABLET | Freq: Every day | ORAL | Status: DC
  Administered 2023-03-18: 5 mg via ORAL
  Filled 2023-03-17: qty 1

## 2023-03-17 MED ORDER — CLOPIDOGREL BISULFATE 75 MG PO TABS
75.0000 mg | ORAL_TABLET | Freq: Every day | ORAL | Status: DC
  Administered 2023-03-18: 75 mg via ORAL
  Filled 2023-03-17: qty 1

## 2023-03-17 MED ORDER — CEFAZOLIN SODIUM-DEXTROSE 2-4 GM/100ML-% IV SOLN
2.0000 g | Freq: Three times a day (TID) | INTRAVENOUS | Status: DC
  Administered 2023-03-17 – 2023-03-18 (×2): 2 g via INTRAVENOUS
  Filled 2023-03-17 (×3): qty 100

## 2023-03-17 MED ORDER — CHLORHEXIDINE GLUCONATE CLOTH 2 % EX PADS: 6.0000 | MEDICATED_PAD | Freq: Every day | CUTANEOUS | Status: DC

## 2023-03-17 MED ORDER — SODIUM CHLORIDE 0.9 % IV SOLN: INTRAVENOUS | Status: DC

## 2023-03-17 MED ORDER — VANCOMYCIN HCL 1500 MG/300ML IV SOLN
1500.0000 mg | INTRAVENOUS | Status: DC
  Filled 2023-03-17: qty 300

## 2023-03-17 NOTE — Progress Notes (Signed)
Pharmacy Antibiotic Note  Kenneth Montoya is a 82 y.o. male admitted on 03/10/2023 with bacteremia.  Pharmacy has been consulted for vancomycin dosing to cover for abiotrophia defective in 10/17 blood culture.    Plan: Vancomycin 1500 IV every 24 hours.  Goal trough 15-20 mcg/mL.  Height: 5\' 8"  (172.7 cm) Weight: 94.8 kg (208 lb 15.9 oz) IBW/kg (Calculated) : 68.4  Temp (24hrs), Avg:97.7 F (36.5 C), Min:97.4 F (36.3 C), Max:98 F (36.7 C)  Recent Labs  Lab 03/11/23 0901 03/12/23 0504 03/12/23 0555 03/13/23 0501 03/14/23 0502 03/15/23 0516 03/16/23 0510 03/17/23 0507  WBC 14.1* 12.3*  --  15.6* 14.9* 10.8* 10.5  --   CREATININE 0.99  --    < > 1.33* 1.40* 1.35* 1.00 1.24  LATICACIDVEN 1.2  --   --   --   --   --   --   --    < > = values in this interval not displayed.    Estimated Creatinine Clearance: 50.4 mL/min (by C-G formula based on SCr of 1.24 mg/dL).    Allergies  Allergen Reactions   Percocet [Oxycodone-Acetaminophen] Itching    Antimicrobials this admission: 10/15 cefazolin >> 10/24 10/20 ceftriaxone >> 10/21 10/21 vancomycin>>  Dose adjustments this admission:   Microbiology results: 10/15 BCx: MSSA 10/17 BCx: abiotrophia defectiva in 1 set 10/18 BCx : NG  10/15 UCx: NG   10/17 wound right hip MSSA  Thank you for allowing pharmacy to be a part of this patient's care.  Mickeal Skinner 03/17/2023 11:51 AM

## 2023-03-17 NOTE — Progress Notes (Signed)
Id brief update   Discussed case with ortho, who would like to treat as though joint involved   Plan 12 weeks. No rifampin as unclear benefit here. Given planned duration, will dc tee    -6 weeks cefazolin followed by 6 weeks cefadroxil -discussed with primary team/cards

## 2023-03-17 NOTE — Progress Notes (Signed)
Physical Therapy Treatment Patient Details Name: Kenneth Montoya MRN: [id number] DOB: 05-10-1941 Today's Date: 03/17/2023   History of Present Illness Kenneth Montoya is an 82 y.o. M with hx HTN, CAD, TIA, PVD carotid, HLD, and SIADH with chronic hyponatremia and recent right THA  02/12/23 who presented 03/10/23  with weakness and panic attacks, I n the ER, found to have redness and swelling of his incisionwith drainage from  incision, and confusion.  CT: a nondisplaced fracture  extending from the superior posteromedial acetabulum superiorly into  the posterior iliac bone. Large fluid collection laterally within the upper right quadriceps  muscle. This could reflect a postoperative intramuscular hematoma or  abscess. Pt s/p I&D R hip 03/13/23.    PT Comments  Pt received up in chair, family at bedside. Discussed role of PT and remaining acute PT goals. Pt stated he plans to spend a short time at [facility] once medically cleared for d/c. Pt did demonstrate improved ability to transfer sit to stand at [facility] with MinA. Progressed gait training and distance with RW ~81ft and CGA. Increased tolerance for weight shift onto R LE despite 8/10 pain. Pt however remains very motivated to improve functional mobility and balance to reduce fall risk and return home safely.     If plan is discharge home, recommend the following: Assist for transportation;Help with stairs or ramp for entrance;A little help with bathing/dressing/bathroom;A little help with walking and/or transfers   Can travel by private vehicle     No  Equipment Recommendations  None recommended by PT    Recommendations for Other Services       Precautions / Restrictions Precautions Precautions: Fall Precaution Comments: confusion Restrictions Weight Bearing Restrictions: No     Mobility  Bed Mobility               General bed mobility comments: NT pt in chair pre/post session    Transfers Overall transfer level: Needs  assistance Equipment used: Rolling walker (2 wheels) Transfers: Sit to/from Stand, Bed to chair/wheelchair/BSC Sit to Stand: Min assist           General transfer comment: Improved ability to transfer sit<>stand since previous session    Ambulation/Gait Ambulation/Gait assistance: Contact guard assist Gait Distance (Feet): 90 Feet Assistive device: Rolling walker (2 wheels) Gait Pattern/deviations: Step-to pattern, Antalgic Gait velocity: decr     General Gait Details: Slightly unsteady, no LOB, slow cadence   Stairs             Wheelchair Mobility     Tilt Bed    Modified Rankin (Stroke Patients Only)       Balance Overall balance assessment: History of Falls, Needs assistance Sitting-balance support: Feet supported, Single extremity supported Sitting balance-Leahy Scale: Fair     Standing balance support: Bilateral upper extremity supported, During functional activity, Reliant on assistive device for balance Standing balance-Leahy Scale: Fair                              Cognition Arousal: Alert Behavior During Therapy: WFL for tasks assessed/performed                                   General Comments: Pleasant and motivated        Exercises General Exercises - Lower Extremity Ankle Circles/Pumps: AROM, Both, 15 reps, Seated Long Arc Quad: AROM, Left, AAROM,  Right, 10 reps, Seated    General Comments General comments (skin integrity, edema, etc.): Pt educated on role of PT and benefits of increasing mobility      Pertinent Vitals/Pain Pain Assessment Pain Assessment: 0-10 Pain Score: 8  Pain Location: R hip/leg Pain Descriptors / Indicators: Discomfort, Grimacing Pain Intervention(s): Patient requesting pain meds-RN notified    Home Living                          Prior Function            PT Goals (current goals can now be found in the care plan section) Acute Rehab PT Goals Patient Stated  Goal: go home Progress towards PT goals: Progressing toward goals    Frequency    Min 1X/week      PT Plan      Co-evaluation              AM-PAC PT "6 Clicks" Mobility   Outcome Measure  Help needed turning from your back to your side while in a flat bed without using bedrails?: A Lot Help needed moving from lying on your back to sitting on the side of a flat bed without using bedrails?: A Lot Help needed moving to and from a bed to a chair (including a wheelchair)?: A Little Help needed standing up from a chair using your arms (e.g., wheelchair or bedside chair)?: A Little Help needed to walk in hospital room?: A Little Help needed climbing 3-5 steps with a railing? : A Lot 6 Click Score: 15    End of Session Equipment Utilized During Treatment: Gait belt Activity Tolerance: Patient tolerated treatment well Patient left: in chair;with call bell/phone within reach;with chair alarm set;with family/visitor present Nurse Communication: Mobility status;Patient requests pain meds PT Visit Diagnosis: Unsteadiness on feet (R26.81);Muscle weakness (generalized) (M62.81);Difficulty in walking, not elsewhere classified (R26.2);Pain Pain - Right/Left: Right Pain - part of body: Leg     Time: 5409-8119 PT Time Calculation (min) (ACUTE ONLY): 24 min  Charges:    $Gait Training: 8-22 mins $Therapeutic Exercise: 8-22 mins PT General Charges $$ ACUTE PT VISIT: 1 Visit                    Zadie Cleverly, PTA  Jannet Askew 03/17/2023, 3:51 PM

## 2023-03-17 NOTE — Progress Notes (Signed)
[facility]  Date of Admission:  03/10/2023   Total days of inpatient antibiotics 3  Principal Problem:   MSSA bacteremia due to operative wound infection Active Problems:   Bladder outflow obstruction   Essential hypertension   Hyponatremia   Transient hypoxia, resolved   Possible closed right periprosthetic hip fracture, initial encounter (HCC)   Acute metabolic encephalopathy   Adjustment disorder   Leg swelling   Acute kidney injury   PVD (peripheral vascular disease) (HCC)   Normocytic anemia   Obesity (BMI 30-39.9)          Assessment: 82 year old male admitted with: #MSSA bacteremia secondary to right hip surgical site infection, adjacent to right hip prosthetic joint  (done 02/12/23) - drainage and pain after fall after surgery -- admission CT showed nondisplaced fracture with large fluid collection concerning for hematoma versus abscess. -- admission bcx 10/15 mssa -- s/p I&D 10/17 -- joint capsule noted intact and no exploration done but exploration involved subfascial space -- repeat bcx 10/17 one of two set abiotrophia; repeat bcx 10/18 negative -- abx changed on 10/20 to ceftriaxone to cover abiotrophia, but the repeat bcx 10/18 had remained negative, suggesting 10/17 bcx was contaminant    Recommendations:  - resume abx with cefazolin - await tee tomorrow for mssa bsi r/o endocarditis - had sent secure chat to orthopedics team regarding if they have any concern of joint involvement, in which I'll treat for 12 weeks abx. And if this route is taken, he potentiallly could benefit from initial 4 weeks rifampin adjunctive therapy as well - await orthopedics input - discussed with primary team    Microbiology:   Antibiotics: Cefazolin 10/15-10/20; 10/21-present Ceftriaxone 10/20 Vancomycin 10/14  Cultures: Blood 10/15 1/2 MSSA 10/17 1 of 2 set abiotrophia 10/18 no growth  Urine 10/15 no growth   SUBJECTIVE: No  complaint Afebrile   Review of Systems: Review of Systems  All other systems reviewed and are negative.    Scheduled Meds:  ascorbic acid  1,000 mg Oral QHS   atorvastatin  10 mg Oral Daily   carvedilol  6.25 mg Oral BID   enoxaparin (LOVENOX) injection  40 mg Subcutaneous Q24H   fluticasone  2 spray Each Nare Daily   furosemide  40 mg Intravenous Daily   loratadine  10 mg Oral Daily   olopatadine  1 drop Both Eyes QHS   pantoprazole  40 mg Oral Daily   psyllium  1 packet Oral QHS   sodium chloride flush  3 mL Intravenous Q12H   tamsulosin  0.4 mg Oral QHS   Continuous Infusions:  vancomycin     PRN Meds:.acetaminophen **OR** acetaminophen, bisacodyl, cyclobenzaprine, HYDROcodone-acetaminophen, ondansetron **OR** ondansetron (ZOFRAN) IV, mouth rinse, polyethylene glycol, senna-docusate Allergies  Allergen Reactions   Percocet [Oxycodone-Acetaminophen] Itching    OBJECTIVE: Vitals:   03/16/23 1022 03/16/23 1841 03/16/23 2235 03/17/23 0438  BP: 139/63 (!) 155/98 (!) 152/72 134/64  Pulse: 92 91 86 74  Resp: 14 20 20 20   Temp: (!) 97.4 F (36.3 C) (!) 97.4 F (36.3 C) 97.8 F (36.6 C) 98 F (36.7 C)  TempSrc: Oral Oral Oral Oral  SpO2: 95% 99% 97% 94%  Weight:      Height:       Body mass index is 31.78 kg/m.  Physical Exam Constitutional:      General: He is not in acute distress.    Appearance: He is normal weight. He  is not toxic-appearing.  HENT:     Head: Normocephalic and atraumatic.     Right Ear: External ear normal.     Left Ear: External ear normal.     Nose: No congestion or rhinorrhea.     Mouth/Throat:     Mouth: Mucous membranes are moist.     Pharynx: Oropharynx is clear.  Eyes:     Extraocular Movements: Extraocular movements intact.     Conjunctiva/sclera: Conjunctivae normal.     Pupils: Pupils are equal, round, and reactive to light.  Cardiovascular:     Rate and Rhythm: Normal rate and regular rhythm.     Heart sounds: No murmur  heard.    No friction rub. No gallop.  Pulmonary:     Effort: Pulmonary effort is normal.     Breath sounds: Normal breath sounds.  Abdominal:     General: Abdomen is flat. Bowel sounds are normal.     Palpations: Abdomen is soft.  Musculoskeletal:        General: No swelling.     Cervical back: Normal range of motion and neck supple.     Comments: Right hip wound dressing with some strike through serosanguinous stain; nontender but slightly edematous/indurated along incision  Skin:    General: Skin is warm and dry.  Neurological:     General: No focal deficit present.     Mental Status: He is oriented to person, place, and time.  Psychiatric:        Mood and Affect: Mood normal.       Lab Results Lab Results  Component Value Date   WBC 10.5 03/16/2023   HGB 9.0 (L) 03/16/2023   HCT 27.2 (L) 03/16/2023   MCV 92.5 03/16/2023   PLT 283 03/16/2023    Lab Results  Component Value Date   CREATININE 1.24 03/17/2023   BUN 23 03/17/2023   NA 121 (L) 03/17/2023   K 4.1 03/17/2023   CL 84 (L) 03/17/2023   CO2 25 03/17/2023    Lab Results  Component Value Date   ALT 17 03/14/2023   AST 50 (H) 03/14/2023   ALKPHOS 211 (H) 03/14/2023   BILITOT 0.9 03/14/2023        Raymondo Band, MD [facility] 03/17/2023, 12:53 PM

## 2023-03-17 NOTE — TOC Progression Note (Addendum)
Transition of Care [facility]) - Progression Note    Patient Details  Name: Kenneth Montoya MRN: [id number] Date of Birth: 08-Mar-1941  [facility]) CM/SW Contact  Atzin Buchta, Olegario Messier, RN Phone Number: 03/17/2023, 2:05 PM  Clinical Narrative:[address] chosen-rep [facility]el following for d/c.  -3:50p-Noted for long term iv abx, PICC line.      Expected Discharge Plan: Skilled Nursing Facility Barriers to Discharge: Continued Medical Work up  Expected Discharge Plan and Services   Discharge Planning Services: CM Consult   Living arrangements for the past 2 months: Single Family Home                                       Social Determinants of Health (SDOH) Interventions SDOH Screenings   Food Insecurity: No Food Insecurity (03/11/2023)  Housing: Low Risk  (03/11/2023)  Transportation Needs: No Transportation Needs (03/11/2023)  Utilities: Not At Risk (03/11/2023)  Alcohol Screen: Low Risk  (03/04/2022)  Depression (PHQ2-9): Low Risk  (03/06/2023)  Financial Resource Strain: Low Risk  (03/06/2023)  Physical Activity: Inactive (03/06/2023)  Social Connections: Socially Integrated (03/06/2023)  Stress: No Stress Concern Present (03/06/2023)  Tobacco Use: Low Risk  (03/10/2023)  Health Literacy: Adequate Health Literacy (03/06/2023)    Readmission Risk Interventions     No data to display

## 2023-03-17 NOTE — Progress Notes (Signed)
   Subjective: 4 Days Post-Op Procedure(s) (LRB): IRRIGATION AND DEBRIDEMENT RIGHT HIP (Right) Patient reports pain as mild.   Patient seen in rounds by Dr. Lequita Halt. Patient is feeling well, no changes over the weekend regarding the hip.  Objective: Vital signs in last 24 hours: Temp:  [97.4 F (36.3 C)-98 F (36.7 C)] 98 F (36.7 C) (10/21 0438) Pulse Rate:  [74-92] 74 (10/21 0438) Resp:  [14-20] 20 (10/21 0438) BP: (134-155)/(63-98) 134/64 (10/21 0438) SpO2:  [94 %-99 %] 94 % (10/21 0438)  Intake/Output from previous day:  Intake/Output Summary (Last 24 hours) at 03/17/2023 0901 Last data filed at 03/17/2023 0327 Gross per 24 hour  Intake 830.06 ml  Output 1650 ml  Net -819.94 ml    Intake/Output this shift: No intake/output data recorded.  Labs: Recent Labs    03/15/23 0516 03/16/23 0510  HGB 8.9* 9.0*   Recent Labs    03/15/23 0516 03/16/23 0510  WBC 10.8* 10.5  RBC 2.84* 2.94*  HCT 27.0* 27.2*  PLT 270 283   Recent Labs    03/16/23 0510 03/17/23 0507  NA 125* 121*  K 4.2 4.1  CL 90* 84*  CO2 27 25  BUN 23 23  CREATININE 1.00 1.24  GLUCOSE 110* 112*  CALCIUM 8.5* 8.3*   No results for input(s): "LABPT", "INR" in the last 72 hours.  Exam: General - Patient is Alert and Oriented Extremity - Neurologically intact Neurovascular intact Sensation intact distally Dorsiflexion/Plantar flexion intact Dressing/Incision - clean, dry, no drainage Motor Function - intact, moving foot and toes well on exam.   Past Medical History:  Diagnosis Date   Anxiety    Arthritis    generalized   BPH (benign prostatic hyperplasia)    Cancer of skin, squamous cell    frozen    Cataract    bilateral sx   GERD (gastroesophageal reflux disease)    on meds   Hypertension    on meds   Shingles    SIADH (syndrome of inappropriate ADH production) (HCC)    Stroke (HCC)     Assessment/Plan: 4 Days Post-Op Procedure(s) (LRB): IRRIGATION AND DEBRIDEMENT  RIGHT HIP (Right) Principal Problem:   MSSA bacteremia due to operative wound infection Active Problems:   Bladder outflow obstruction   Essential hypertension   Hyponatremia   Transient hypoxia, resolved   Possible closed right periprosthetic hip fracture, initial encounter (HCC)   Acute metabolic encephalopathy   Adjustment disorder   Leg swelling   Acute kidney injury   PVD (peripheral vascular disease) (HCC)   Normocytic anemia   Obesity (BMI 30-39.9)  Estimated body mass index is 31.78 kg/m as calculated from the following:   Height as of this encounter: 5\' 8"  (1.727 m).   Weight as of this encounter: 94.8 kg. Up with therapy  DVT Prophylaxis - Lovenox Weight-bearing as tolerated  Awaiting TEE per ID Dispo per medical team  Arther Abbott, PA-C Orthopedic Surgery [phone] 03/17/2023, 9:01 AM

## 2023-03-17 NOTE — Progress Notes (Signed)
Patient scheduled to go to cath lab 10/22 for TEE. Approx time between 10-11a.  Spoke to Clydie Braun at [facility] lab who stated they (cath lab) will call and arrange [facility] when they are ready for him.

## 2023-03-17 NOTE — Progress Notes (Signed)
   [facility] has been requested to perform a transesophageal echocardiogram on [name] for bacteremia.  After careful review of history and examination, the risks and benefits of transesophageal echocardiogram have been explained including risks of esophageal damage, perforation (1:10,000 risk), bleeding, pharyngeal hematoma as well as other potential complications associated with conscious sedation including aspiration, arrhythmia, respiratory failure and death. Alternatives to treatment were discussed, questions were answered. Patient is willing to proceed.   Abagail Kitchens, PA-C  03/17/2023 1:07 PM

## 2023-03-17 NOTE — Plan of Care (Signed)
  Problem: Activity: Goal: Ability to avoid complications of mobility impairment will improve Outcome: Progressing Goal: Ability to tolerate increased activity will improve Outcome: Progressing   Problem: Clinical Measurements: Goal: Postoperative complications will be avoided or minimized Outcome: Progressing   Problem: Pain Management: Goal: Pain level will decrease with appropriate interventions Outcome: Progressing   Problem: Skin Integrity: Goal: Will show signs of wound healing Outcome: Progressing   

## 2023-03-17 NOTE — Progress Notes (Signed)
Progress Note   Patient: Kenneth Montoya JYN:[id number] DOB: 1940/08/19 DOA: 03/10/2023     6 DOS: the patient was seen and examined on 03/17/2023 at 2:48PM      Brief hospital course: Mr. Rambin is an 82 y.o. M with hx HTN, CAD, TIA, PVD carotid, HLD, and SIADH with chronic hyponatremia and recent right THA who presented with weakness and panic attacks.  In the ER, found to have redness and swelling of his incision, WBC 17K and confusion.  Admitted on cefazolin.  Blood cultures subsequently grew MSSA.       Assessment and Plan: * MSSA bacteremia due to operative wound infection - Continue antibiotics per ID - Place PICC   Hyponatremia Mild, asymptomatic.  Recall, baseline is 125  - Continue Lasix  Acute metabolic encephalopathy Resolved - Standard delirium precautions  Possible closed right periprosthetic hip fracture, initial encounter (HCC) Superficial cellulitis at surgical site  Leg swelling - Continue Lasix  Obesity (BMI 30-39.9) BMI 31  Normocytic anemia Stable  PVD (peripheral vascular disease) (HCC) Cerebrovascular disease Coronary artery disease without angina - Continue Lipitor - Resume Plavix    Acute kidney injury Cr stable at 1.2 - Daily BMP  Adjustment disorder Anxiety - Try behavioral approaches - Hold duloxetine for hyponatremia  Essential hypertension BP normal - Continue Coreg - Lasix - Resume amlodipine - Hold losartan and hydrochlorothiazide  Bladder outflow obstruction - Continue Flomax          Subjective: Patient is feeling "better and better", no fever, the pain is well-controlled, no increasing redness around his incision.     Physical Exam: BP (!) 141/82 (BP Location: Right Arm)   Pulse 86   Temp 98.2 F (36.8 C) (Oral)   Resp 18   Ht 5\' 8"  (1.727 m)   Wt 94.8 kg   SpO2 94%   BMI 31.78 kg/m   Adult male, lying in bed, interactive and appropriate RRR, no murmurs, unchanged bilateral lower  extremity peripheral edema Respiratory normal, lungs clear without rales or wheezes Abdomen soft no tenderness palpation or guarding There is no redness around his incision, there is scant bloody drainage on the dressing Attention normal, affect slightly anxious but overall pleasant, judgment and insight appear mildly impaired but at baseline, face symmetric, generalized weakness but symmetric strength    Data Reviewed: Discussed with infectious disease Sodium down to 121, creatinine stable at 1.2  Family Communication: Daughter, no answer    Disposition: Status is: Inpatient         Author: Alberteen Sam, MD 03/17/2023 5:43 PM  For on call review [email or web address].

## 2023-03-17 NOTE — Progress Notes (Signed)
Peripherally Inserted Central Catheter Placement  The IV Nurse has discussed with the patient and/or persons authorized to consent for the patient, the purpose of this procedure and the potential benefits and risks involved with this procedure.  The benefits include less needle sticks, lab draws from the catheter, and the patient may be discharged home with the catheter. Risks include, but not limited to, infection, bleeding, blood clot (thrombus formation), and puncture of an artery; nerve damage and irregular heartbeat and possibility to perform a PICC exchange if needed/ordered by physician.  Alternatives to this procedure were also discussed.  Bard Power PICC patient education guide, fact sheet on infection prevention and patient information card has been provided to patient /or left at bedside.    PICC Placement Documentation  PICC Single Lumen 03/17/23 Right Basilic 41 cm 0 cm (Active)  Indication for Insertion or Continuance of Line Prolonged intravenous therapies 03/17/23 1700  Exposed Catheter (cm) 0 cm 03/17/23 1700  Site Assessment Clean, Dry, Intact 03/17/23 1700  Line Status Flushed;Saline locked;Blood return noted 03/17/23 1700  Dressing Type Transparent;Securing device 03/17/23 1700  Dressing Status Antimicrobial disc in place;Clean, Dry, Intact 03/17/23 1700  Line Adjustment (NICU/IV Team Only) No 03/17/23 1700  Dressing Intervention New dressing;Adhesive placed at insertion site (IV team only);Other (Comment) 03/17/23 1700  Dressing Change Due 03/24/23 03/17/23 1700       Reginia Forts Albarece 03/17/2023, 5:21 PM

## 2023-03-18 DIAGNOSIS — R7881 Bacteremia: Secondary | ICD-10-CM | POA: Diagnosis not present

## 2023-03-18 DIAGNOSIS — B9561 Methicillin susceptible Staphylococcus aureus infection as the cause of diseases classified elsewhere: Secondary | ICD-10-CM | POA: Diagnosis not present

## 2023-03-18 LAB — CBC
HCT: 28.9 % — ABNORMAL LOW (ref 39.0–52.0)
Hemoglobin: 9.5 g/dL — ABNORMAL LOW (ref 13.0–17.0)
MCH: 30.1 pg (ref 26.0–34.0)
MCHC: 32.9 g/dL (ref 30.0–36.0)
MCV: 91.5 fL (ref 80.0–100.0)
Platelets: 282 10*3/uL (ref 150–400)
RBC: 3.16 MIL/uL — ABNORMAL LOW (ref 4.22–5.81)
RDW: 13.9 % (ref 11.5–15.5)
WBC: 15 10*3/uL — ABNORMAL HIGH (ref 4.0–10.5)
nRBC: 0 % (ref 0.0–0.2)

## 2023-03-18 LAB — BASIC METABOLIC PANEL
Anion gap: 13 (ref 5–15)
BUN: 21 mg/dL (ref 8–23)
CO2: 26 mmol/L (ref 22–32)
Calcium: 8.4 mg/dL — ABNORMAL LOW (ref 8.9–10.3)
Chloride: 85 mmol/L — ABNORMAL LOW (ref 98–111)
Creatinine, Ser: 1.04 mg/dL (ref 0.61–1.24)
GFR, Estimated: >60 mL/min (ref >60)
Glucose, Bld: 126 mg/dL — ABNORMAL HIGH (ref 70–99)
Potassium: 4.1 mmol/L (ref 3.5–5.1)
Sodium: 124 mmol/L — ABNORMAL LOW (ref 135–145)

## 2023-03-18 LAB — AEROBIC/ANAEROBIC CULTURE W GRAM STAIN (SURGICAL/DEEP WOUND): Gram Stain: NONE SEEN

## 2023-03-18 LAB — CULTURE, BLOOD (ROUTINE X 2)
Culture: NO GROWTH
Special Requests: ADEQUATE

## 2023-03-18 SURGERY — TRANSESOPHAGEAL ECHOCARDIOGRAM (TEE) (CATHLAB)
Anesthesia: Monitor Anesthesia Care

## 2023-03-18 MED ORDER — FUROSEMIDE 40 MG PO TABS: 40.0000 mg | ORAL_TABLET | Freq: Every day | ORAL | Status: DC

## 2023-03-18 MED ORDER — HYDROCODONE-ACETAMINOPHEN 5-325 MG PO TABS: 1.0000 | ORAL_TABLET | Freq: Four times a day (QID) | ORAL | 0 refills | Status: DC | PRN

## 2023-03-18 MED ORDER — CEFAZOLIN IV (FOR PTA / DISCHARGE USE ONLY): 2.0000 g | Freq: Three times a day (TID) | INTRAVENOUS | 0 refills | Status: AC

## 2023-03-18 MED ORDER — CEFADROXIL 500 MG PO CAPS: 1000.0000 mg | ORAL_CAPSULE | Freq: Two times a day (BID) | ORAL | Status: DC

## 2023-03-18 NOTE — Consult Note (Addendum)
[facility]  [facility] Inpatient Consult   03/18/2023  CEDRIC VARGHESE 1940/12/24 [id number]  [facility] [[facility]]  Accountable Care Organization [ACO] Patient: Medicare ACO REACH  [facility] Liaison for Patient at [facility], * showing as Ortho Bundle noted  Primary Care Provider:  Etta Grandchild, MD, [place] at [facility] is the provider for post facility follow up and calls   Patient was reviewed for less than 30 days readmission medium risk score with a 7 day length of stay for barriers to care to returning home.  Patient was screened for hospitalization and on behalf of [facility] /[facility] Care Coordination to assess for post hospital community care needs.  Patient is  for a skilled nursing facility level of care for post hospital transition.  Noted.Patient to [facility] for long term  IV antibiotics therapy for MSSA.   Plan:   Will notify the Community [facility] RN can follow for any known or needs for transitional care needs for returning to post facility care coordination needs to return to community.  For questions or referrals, please contact:   Charlesetta Shanks, RN, BSN, CCM [place]  [facility], [facility] Liaison Direct Dial: [phone] or secure chat Website: [email or web address]

## 2023-03-18 NOTE — Final Progress Note (Signed)
Diagnosis: Right pji surgical site infection, deep space, concern for joint involvement Mssa bsi   Abiotrophia bacteremia likely contaminant; repeat bcx cleared without treatment  Not candidate for rifampin (nature of surgery/surgical site and potential IE that we forgo tee due to duration of tx)   OPAT Orders Discharge antibiotics to be given via PICC line Discharge antibiotics: OPAT Order Details             Outpatient Parenteral Antibiotic Therapy Consult  Until discontinued       Provider:  (Not yet assigned)  Question Answer Comment  Antibiotic Cefazolin (Ancef) IVPB   Indications for use MSSA bacteremia secondary to right hip PJI   End Date 04/24/2023   Approving ID Provider's Name Rutha Bouchard                Duration: 6 weeks cefazolin then another 6 weeks cefadroxil starting 11/28   [facility] Care Per Protocol:  Home health RN for IV administration and teaching; PICC line care and labs.    Labs weekly while on IV antibiotics: _x_ CBC with differential __ BMP _x_ CMP _x_ CRP __ ESR __ Vancomycin trough __ CK  _x_ Please pull PIC at completion of IV antibiotics __ Please leave PIC in place until doctor has seen patient or been notified  Fax weekly labs to [phone]  Clinic Follow Up Appt: 11/12 @ 345pm  @  [facility] [address], [place], [place] [address] Phone: [phone]

## 2023-03-18 NOTE — TOC Transition Note (Addendum)
Transition of Care [facility]) - CM/SW Discharge Note   Patient Details  Name: Kenneth Montoya MRN: [id number] Date of Birth: 12/27/1940  [facility]) CM/SW Contact:  Lanier Clam, RN Phone Number: 03/18/2023, 11:43 AM   Clinical Narrative: d/c summary sent [address] rep [facility] Cruel accepted. Aware of PICC, long term iv abx ancef to end 04/24/23. Await rm#,tel# report prior PTAR.  -12:17p-Going to [address] rm#[id number],report tel#[phone]. PTAR called No further CM needs.     Final next level of care: Skilled Nursing Facility Barriers to Discharge: No Barriers Identified   Patient Goals and CMS Choice CMS Medicare.gov Compare Post Acute Care list provided to:: Patient    Discharge Placement                         Discharge Plan and Services Additional resources added to the After Visit Summary for     Discharge Planning Services: CM Consult                                 Social Determinants of Health (SDOH) Interventions SDOH Screenings   Food Insecurity: No Food Insecurity (03/11/2023)  Housing: Low Risk  (03/11/2023)  Transportation Needs: No Transportation Needs (03/11/2023)  Utilities: Not At Risk (03/11/2023)  Alcohol Screen: Low Risk  (03/04/2022)  Depression (PHQ2-9): Low Risk  (03/06/2023)  Financial Resource Strain: Low Risk  (03/06/2023)  Physical Activity: Inactive (03/06/2023)  Social Connections: Socially Integrated (03/06/2023)  Stress: No Stress Concern Present (03/06/2023)  Tobacco Use: Low Risk  (03/10/2023)  Health Literacy: Adequate Health Literacy (03/06/2023)     Readmission Risk Interventions     No data to display

## 2023-03-18 NOTE — Discharge Summary (Signed)
Physician Discharge Summary   Patient: Kenneth Montoya MRN: [id number] DOB: 22-Aug-1940  Admit date:     03/10/2023  Discharge date: 03/18/23  Discharge Physician: Alberteen Sam   PCP: Etta Grandchild, MD     Recommendations at discharge:  Follow up with Infectious Disease within 4 weeks for MSSA bacteremia Follow up with Dr. Lequita Halt within 2 weeks for staple removal and post-op check Continue cefazolin for 6 weeks, end-of-therapy 04/24/23 AFTER cefazolin, start cefadroxil 1g twice daily for additional six weeks, 04/25/23 to 06/06/23 Please obtain CBC/diff, CMP, and CRP weekly while on antibiotic therapy Hold losartan and resume if/when appropriate Adjust new Lasix based on Cr results     Discharge Diagnoses: Principal Problem:   MSSA bacteremia due to operative wound infection Active Problems:   Possible septic arthritis of the right hip   Possible closed right periprosthetic hip fracture, initial encounter (HCC)   Acute metabolic encephalopathy   Leg swelling, suspect Chronic diastolic CHF   Bladder outflow obstruction   Essential hypertension   Hyponatremia   Transient hypoxia, resolved   Adjustment disorder   Acute kidney injury   PVD (peripheral vascular disease) (HCC)   Normocytic anemia   Obesity (BMI 30-39.9)      Hospital Course: [name] is an 82 y.o. M with hx HTN, CAD, TIA, PVD carotid, HLD, and SIADH with chronic hyponatremia and recent right THA who presented with weakness and panic attacks.  In the ER, found to have redness and swelling of his incision, WBC 17K and confusion.  Admitted on cefazolin.  Blood cultures subsequently grew MSSA.    10/15 Blood culture x2: MSSA in 1/2 10/15 Urine culture: NG 10/17 surgical culture of soft tissue collection: MSSA 10/17 repeat blood culture x2: Abiotrophica defectiva in 1/2, likely contaminant 10/18 repeat blood culture x2: NGTD      * MSSA bacteremia due to operative wound infection Admitted  on antibiotics.  Orthopedics consulted.  Went to OR on 10/17 with Dr. Lequita Halt, debrided two fluid collections in the soft tissue, dissected down to the joint capsule, which appeared intact and so was not opened.    ID discussed with orthopedics.  It was felt that sampling the joint fluid was too risky, but that empiric treatment for presumed joint space involvement was prudent, with 12 weeks antibiotics (6 weeks cefazolin followed by 6 weeks cefadroxil).  Given presence of hardware, I am uncertain if ID/Ortho plan for longer term suppression or not.    clinically did not appear to be involvement of the hip joint.        Acute metabolic encephalopathy Patient appears to have some MCI maybe incipient dementia.  Here, he was initially disoriented and forgetful.  This improved with antibiotic treatment.     Likely chronic diastolic CHF Patient had some pitting edema in bilateral LEs.  No hypoxia, normal EF on echo.  Bilateral US showed no clot.  He was treated with Lasix here, still with some asymptomatic pitting edema and so discharged on oral Lasix in place of hydrochlorothiazide.  Recommend titration based on serial BMP.    Obesity (BMI 30-39.9) BMI 31  Normocytic anemia Attention on CBC/diff in setting of prolonged cephalosporin use.  PVD (peripheral vascular disease) (HCC) Cerebrovascular disease Coronary artery disease without angina On Lipitor, Plavix.   Acute kidney injury Cr stable with diuresis.  Had some mild congestive nephropathy.    Adjustment disorder Anxiety Duloxetine held given hyponatremia, restarted at discharge  Chronic hyponatremia Baseline is 125.  tamsulosin 0.4 MG Caps capsule Commonly known as: FLOMAX Take 0.4 mg by mouth at bedtime.   traZODone 50 MG tablet Commonly known as: DESYREL Take 25 mg by mouth at bedtime.               Home Infusion Instuctions  (From admission, onward)           Start     Ordered   03/18/23 0000  Home infusion instructions       Question:  Instructions  Answer:  Flushing of vascular access device: 0.9% NaCl pre/post medication administration and prn patency; Heparin 100 u/ml, 5ml for implanted ports and Heparin 10u/ml, 5ml for all other central venous catheters.   03/18/23 1025            Contact information for follow-up providers     Edmisten, Kristie L, PA. Go on 03/18/2023.   Specialty: Orthopedic Surgery Why: at 3:30pm Contact information: [address] [place] [address] [phone]              Contact information for after-discharge  care     Destination     [facility], [facility] Preferred SNF .   Service: Skilled Nursing Contact information: [address] [place] [address] [phone]                     Discharge Instructions     Diet - low sodium heart healthy   Complete by: As directed    Home infusion instructions   Complete by: As directed    Instructions: Flushing of vascular access device: 0.9% NaCl pre/post medication administration and prn patency; Heparin 100 u/ml, 5ml for implanted ports and Heparin 10u/ml, 5ml for all other central venous catheters.   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   1+ LE edema to mid shin bilaterally, somewhat more on right. Respiratory: Normal respiratory rate and rhythm.  CTAB without rales or wheezes. Abdominal: Abdomen soft and non-tender.  No distension or HSM. MSK:    Neuro/Psych: Strength symmetric in upper and lower extremities.  Judgment and insight appear at baseline, mildly impaired.   Condition at discharge: fair  The results of significant diagnostics from this hospitalization (including imaging, microbiology, ancillary and laboratory) are listed below for reference.   Imaging Studies: [name] EKG SITE RITE  Result Date: 03/17/2023 If Site Rite image not attached, placement could not be confirmed due to current cardiac rhythm.  ECHOCARDIOGRAM COMPLETE  Result  Date: 03/14/2023    ECHOCARDIOGRAM REPORT   Patient Name:   Kenneth Montoya Date of Exam: 03/14/2023 Medical Rec #:  [id number]        Height:       68.0 in Accession #:    [id number]       Weight:       209.0 lb Date of Birth:  1941-02-05  tamsulosin 0.4 MG Caps capsule Commonly known as: FLOMAX Take 0.4 mg by mouth at bedtime.   traZODone 50 MG tablet Commonly known as: DESYREL Take 25 mg by mouth at bedtime.               Home Infusion Instuctions  (From admission, onward)           Start     Ordered   03/18/23 0000  Home infusion instructions       Question:  Instructions  Answer:  Flushing of vascular access device: 0.9% NaCl pre/post medication administration and prn patency; Heparin 100 u/ml, 5ml for implanted ports and Heparin 10u/ml, 5ml for all other central venous catheters.   03/18/23 1025            Contact information for follow-up providers     Edmisten, Kristie L, PA. Go on 03/18/2023.   Specialty: Orthopedic Surgery Why: at 3:30pm Contact information: [address] [place] [address] [phone]              Contact information for after-discharge  care     Destination     [facility], [facility] Preferred SNF .   Service: Skilled Nursing Contact information: [address] [place] [address] [phone]                     Discharge Instructions     Diet - low sodium heart healthy   Complete by: As directed    Home infusion instructions   Complete by: As directed    Instructions: Flushing of vascular access device: 0.9% NaCl pre/post medication administration and prn patency; Heparin 100 u/ml, 5ml for implanted ports and Heparin 10u/ml, 5ml for all other central venous catheters.   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   1+ LE edema to mid shin bilaterally, somewhat more on right. Respiratory: Normal respiratory rate and rhythm.  CTAB without rales or wheezes. Abdominal: Abdomen soft and non-tender.  No distension or HSM. MSK:    Neuro/Psych: Strength symmetric in upper and lower extremities.  Judgment and insight appear at baseline, mildly impaired.   Condition at discharge: fair  The results of significant diagnostics from this hospitalization (including imaging, microbiology, ancillary and laboratory) are listed below for reference.   Imaging Studies: [name] EKG SITE RITE  Result Date: 03/17/2023 If Site Rite image not attached, placement could not be confirmed due to current cardiac rhythm.  ECHOCARDIOGRAM COMPLETE  Result  Date: 03/14/2023    ECHOCARDIOGRAM REPORT   Patient Name:   Kenneth Montoya Date of Exam: 03/14/2023 Medical Rec #:  [id number]        Height:       68.0 in Accession #:    [id number]       Weight:       209.0 lb Date of Birth:  1941-02-05  tamsulosin 0.4 MG Caps capsule Commonly known as: FLOMAX Take 0.4 mg by mouth at bedtime.   traZODone 50 MG tablet Commonly known as: DESYREL Take 25 mg by mouth at bedtime.               Home Infusion Instuctions  (From admission, onward)           Start     Ordered   03/18/23 0000  Home infusion instructions       Question:  Instructions  Answer:  Flushing of vascular access device: 0.9% NaCl pre/post medication administration and prn patency; Heparin 100 u/ml, 5ml for implanted ports and Heparin 10u/ml, 5ml for all other central venous catheters.   03/18/23 1025            Contact information for follow-up providers     Edmisten, Kristie L, PA. Go on 03/18/2023.   Specialty: Orthopedic Surgery Why: at 3:30pm Contact information: [address] [place] [address] [phone]              Contact information for after-discharge  care     Destination     [facility], [facility] Preferred SNF .   Service: Skilled Nursing Contact information: [address] [place] [address] [phone]                     Discharge Instructions     Diet - low sodium heart healthy   Complete by: As directed    Home infusion instructions   Complete by: As directed    Instructions: Flushing of vascular access device: 0.9% NaCl pre/post medication administration and prn patency; Heparin 100 u/ml, 5ml for implanted ports and Heparin 10u/ml, 5ml for all other central venous catheters.   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   1+ LE edema to mid shin bilaterally, somewhat more on right. Respiratory: Normal respiratory rate and rhythm.  CTAB without rales or wheezes. Abdominal: Abdomen soft and non-tender.  No distension or HSM. MSK:    Neuro/Psych: Strength symmetric in upper and lower extremities.  Judgment and insight appear at baseline, mildly impaired.   Condition at discharge: fair  The results of significant diagnostics from this hospitalization (including imaging, microbiology, ancillary and laboratory) are listed below for reference.   Imaging Studies: [name] EKG SITE RITE  Result Date: 03/17/2023 If Site Rite image not attached, placement could not be confirmed due to current cardiac rhythm.  ECHOCARDIOGRAM COMPLETE  Result  Date: 03/14/2023    ECHOCARDIOGRAM REPORT   Patient Name:   Kenneth Montoya Date of Exam: 03/14/2023 Medical Rec #:  [id number]        Height:       68.0 in Accession #:    [id number]       Weight:       209.0 lb Date of Birth:  1941-02-05  cava is dilated in size with greater than 50% respiratory  variability, suggesting right atrial pressure of 8 mmHg. IAS/Shunts: The interatrial septum was not well visualized.  LEFT VENTRICLE PLAX 2D LVIDd:         4.60 cm     Diastology LVIDs:         3.50 cm     LV e' medial:    5.98 cm/s LV PW:         1.00 cm     LV E/e' medial:  18.2 LV IVS:        1.10 cm     LV e' lateral:   10.28 cm/s LVOT diam:     2.30 cm     LV E/e' lateral: 10.6 LV SV:         80 LV SV Index:   39 LVOT Area:     4.15 cm  LV Volumes (MOD) LV vol d, MOD A2C: 76.7 ml LV vol d, MOD A4C: 63.0 ml LV vol s, MOD A2C: 52.0 ml LV vol s, MOD A4C: 32.2 ml LV SV MOD A2C:     24.7 ml LV SV MOD A4C:     63.0 ml LV SV MOD BP:      30.7 ml RIGHT VENTRICLE            IVC RV S prime:     9.68 cm/s  IVC diam: 2.30 cm TAPSE (M-mode): 1.7 cm LEFT ATRIUM             Index        RIGHT ATRIUM           Index LA diam:        3.70 cm 1.78 cm/m   RA Area:     16.70 cm LA Vol (A2C):   31.4 ml 15.08 ml/m  RA Volume:   39.40 ml  18.92 ml/m LA Vol (A4C):   37.2 ml 17.86 ml/m LA Biplane Vol: 34.2 ml 16.42 ml/m  AORTIC VALVE LVOT Vmax:   115.00 cm/s LVOT Vmean:  75.400 cm/s LVOT VTI:    0.193 m  AORTA Ao Root diam: 3.40 cm Ao Asc diam:  3.50 cm MITRAL VALVE                TRICUSPID VALVE MV Area (PHT): 3.91 cm     TR Peak grad:   30.9 mmHg MV Decel Time: 194 msec     TR Vmax:        278.00 cm/s MV E velocity: 109.00 cm/s MV A velocity: 127.00 cm/s  SHUNTS MV E/A ratio:  0.86         Systemic VTI:  0.19 m                             Systemic Diam: 2.30 cm Epifanio Lesches MD Electronically signed by Epifanio Lesches MD Signature Date/Time: 03/14/2023/5:01:32 PM    Final    VAS [name] LOWER EXTREMITY VENOUS (DVT)  Result Date: 03/12/2023  Lower Venous DVT Study Patient Name:  Kenneth Montoya  Date of Exam:   03/12/2023 Medical Rec #: [id number]         Accession #:    [id number] Date of Birth: Dec 29, 1940          Patient Gender: M Patient Age:   56 years Exam Location:  [facility] Procedure:      VAS [name]  LOWER EXTREMITY VENOUS (  tamsulosin 0.4 MG Caps capsule Commonly known as: FLOMAX Take 0.4 mg by mouth at bedtime.   traZODone 50 MG tablet Commonly known as: DESYREL Take 25 mg by mouth at bedtime.               Home Infusion Instuctions  (From admission, onward)           Start     Ordered   03/18/23 0000  Home infusion instructions       Question:  Instructions  Answer:  Flushing of vascular access device: 0.9% NaCl pre/post medication administration and prn patency; Heparin 100 u/ml, 5ml for implanted ports and Heparin 10u/ml, 5ml for all other central venous catheters.   03/18/23 1025            Contact information for follow-up providers     Edmisten, Kristie L, PA. Go on 03/18/2023.   Specialty: Orthopedic Surgery Why: at 3:30pm Contact information: [address] [place] [address] [phone]              Contact information for after-discharge  care     Destination     [facility], [facility] Preferred SNF .   Service: Skilled Nursing Contact information: [address] [place] [address] [phone]                     Discharge Instructions     Diet - low sodium heart healthy   Complete by: As directed    Home infusion instructions   Complete by: As directed    Instructions: Flushing of vascular access device: 0.9% NaCl pre/post medication administration and prn patency; Heparin 100 u/ml, 5ml for implanted ports and Heparin 10u/ml, 5ml for all other central venous catheters.   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   1+ LE edema to mid shin bilaterally, somewhat more on right. Respiratory: Normal respiratory rate and rhythm.  CTAB without rales or wheezes. Abdominal: Abdomen soft and non-tender.  No distension or HSM. MSK:    Neuro/Psych: Strength symmetric in upper and lower extremities.  Judgment and insight appear at baseline, mildly impaired.   Condition at discharge: fair  The results of significant diagnostics from this hospitalization (including imaging, microbiology, ancillary and laboratory) are listed below for reference.   Imaging Studies: [name] EKG SITE RITE  Result Date: 03/17/2023 If Site Rite image not attached, placement could not be confirmed due to current cardiac rhythm.  ECHOCARDIOGRAM COMPLETE  Result  Date: 03/14/2023    ECHOCARDIOGRAM REPORT   Patient Name:   Kenneth Montoya Date of Exam: 03/14/2023 Medical Rec #:  [id number]        Height:       68.0 in Accession #:    [id number]       Weight:       209.0 lb Date of Birth:  1941-02-05  Physician Discharge Summary   Patient: Kenneth Montoya MRN: [id number] DOB: 22-Aug-1940  Admit date:     03/10/2023  Discharge date: 03/18/23  Discharge Physician: Alberteen Sam   PCP: Etta Grandchild, MD     Recommendations at discharge:  Follow up with Infectious Disease within 4 weeks for MSSA bacteremia Follow up with Dr. Lequita Halt within 2 weeks for staple removal and post-op check Continue cefazolin for 6 weeks, end-of-therapy 04/24/23 AFTER cefazolin, start cefadroxil 1g twice daily for additional six weeks, 04/25/23 to 06/06/23 Please obtain CBC/diff, CMP, and CRP weekly while on antibiotic therapy Hold losartan and resume if/when appropriate Adjust new Lasix based on Cr results     Discharge Diagnoses: Principal Problem:   MSSA bacteremia due to operative wound infection Active Problems:   Possible septic arthritis of the right hip   Possible closed right periprosthetic hip fracture, initial encounter (HCC)   Acute metabolic encephalopathy   Leg swelling, suspect Chronic diastolic CHF   Bladder outflow obstruction   Essential hypertension   Hyponatremia   Transient hypoxia, resolved   Adjustment disorder   Acute kidney injury   PVD (peripheral vascular disease) (HCC)   Normocytic anemia   Obesity (BMI 30-39.9)      Hospital Course: [name] is an 82 y.o. M with hx HTN, CAD, TIA, PVD carotid, HLD, and SIADH with chronic hyponatremia and recent right THA who presented with weakness and panic attacks.  In the ER, found to have redness and swelling of his incision, WBC 17K and confusion.  Admitted on cefazolin.  Blood cultures subsequently grew MSSA.    10/15 Blood culture x2: MSSA in 1/2 10/15 Urine culture: NG 10/17 surgical culture of soft tissue collection: MSSA 10/17 repeat blood culture x2: Abiotrophica defectiva in 1/2, likely contaminant 10/18 repeat blood culture x2: NGTD      * MSSA bacteremia due to operative wound infection Admitted  on antibiotics.  Orthopedics consulted.  Went to OR on 10/17 with Dr. Lequita Halt, debrided two fluid collections in the soft tissue, dissected down to the joint capsule, which appeared intact and so was not opened.    ID discussed with orthopedics.  It was felt that sampling the joint fluid was too risky, but that empiric treatment for presumed joint space involvement was prudent, with 12 weeks antibiotics (6 weeks cefazolin followed by 6 weeks cefadroxil).  Given presence of hardware, I am uncertain if ID/Ortho plan for longer term suppression or not.    clinically did not appear to be involvement of the hip joint.        Acute metabolic encephalopathy Patient appears to have some MCI maybe incipient dementia.  Here, he was initially disoriented and forgetful.  This improved with antibiotic treatment.     Likely chronic diastolic CHF Patient had some pitting edema in bilateral LEs.  No hypoxia, normal EF on echo.  Bilateral US showed no clot.  He was treated with Lasix here, still with some asymptomatic pitting edema and so discharged on oral Lasix in place of hydrochlorothiazide.  Recommend titration based on serial BMP.    Obesity (BMI 30-39.9) BMI 31  Normocytic anemia Attention on CBC/diff in setting of prolonged cephalosporin use.  PVD (peripheral vascular disease) (HCC) Cerebrovascular disease Coronary artery disease without angina On Lipitor, Plavix.   Acute kidney injury Cr stable with diuresis.  Had some mild congestive nephropathy.    Adjustment disorder Anxiety Duloxetine held given hyponatremia, restarted at discharge  Chronic hyponatremia Baseline is 125.  Physician Discharge Summary   Patient: Kenneth Montoya MRN: [id number] DOB: 22-Aug-1940  Admit date:     03/10/2023  Discharge date: 03/18/23  Discharge Physician: Alberteen Sam   PCP: Etta Grandchild, MD     Recommendations at discharge:  Follow up with Infectious Disease within 4 weeks for MSSA bacteremia Follow up with Dr. Lequita Halt within 2 weeks for staple removal and post-op check Continue cefazolin for 6 weeks, end-of-therapy 04/24/23 AFTER cefazolin, start cefadroxil 1g twice daily for additional six weeks, 04/25/23 to 06/06/23 Please obtain CBC/diff, CMP, and CRP weekly while on antibiotic therapy Hold losartan and resume if/when appropriate Adjust new Lasix based on Cr results     Discharge Diagnoses: Principal Problem:   MSSA bacteremia due to operative wound infection Active Problems:   Possible septic arthritis of the right hip   Possible closed right periprosthetic hip fracture, initial encounter (HCC)   Acute metabolic encephalopathy   Leg swelling, suspect Chronic diastolic CHF   Bladder outflow obstruction   Essential hypertension   Hyponatremia   Transient hypoxia, resolved   Adjustment disorder   Acute kidney injury   PVD (peripheral vascular disease) (HCC)   Normocytic anemia   Obesity (BMI 30-39.9)      Hospital Course: [name] is an 82 y.o. M with hx HTN, CAD, TIA, PVD carotid, HLD, and SIADH with chronic hyponatremia and recent right THA who presented with weakness and panic attacks.  In the ER, found to have redness and swelling of his incision, WBC 17K and confusion.  Admitted on cefazolin.  Blood cultures subsequently grew MSSA.    10/15 Blood culture x2: MSSA in 1/2 10/15 Urine culture: NG 10/17 surgical culture of soft tissue collection: MSSA 10/17 repeat blood culture x2: Abiotrophica defectiva in 1/2, likely contaminant 10/18 repeat blood culture x2: NGTD      * MSSA bacteremia due to operative wound infection Admitted  on antibiotics.  Orthopedics consulted.  Went to OR on 10/17 with Dr. Lequita Halt, debrided two fluid collections in the soft tissue, dissected down to the joint capsule, which appeared intact and so was not opened.    ID discussed with orthopedics.  It was felt that sampling the joint fluid was too risky, but that empiric treatment for presumed joint space involvement was prudent, with 12 weeks antibiotics (6 weeks cefazolin followed by 6 weeks cefadroxil).  Given presence of hardware, I am uncertain if ID/Ortho plan for longer term suppression or not.    clinically did not appear to be involvement of the hip joint.        Acute metabolic encephalopathy Patient appears to have some MCI maybe incipient dementia.  Here, he was initially disoriented and forgetful.  This improved with antibiotic treatment.     Likely chronic diastolic CHF Patient had some pitting edema in bilateral LEs.  No hypoxia, normal EF on echo.  Bilateral US showed no clot.  He was treated with Lasix here, still with some asymptomatic pitting edema and so discharged on oral Lasix in place of hydrochlorothiazide.  Recommend titration based on serial BMP.    Obesity (BMI 30-39.9) BMI 31  Normocytic anemia Attention on CBC/diff in setting of prolonged cephalosporin use.  PVD (peripheral vascular disease) (HCC) Cerebrovascular disease Coronary artery disease without angina On Lipitor, Plavix.   Acute kidney injury Cr stable with diuresis.  Had some mild congestive nephropathy.    Adjustment disorder Anxiety Duloxetine held given hyponatremia, restarted at discharge  Chronic hyponatremia Baseline is 125.  tamsulosin 0.4 MG Caps capsule Commonly known as: FLOMAX Take 0.4 mg by mouth at bedtime.   traZODone 50 MG tablet Commonly known as: DESYREL Take 25 mg by mouth at bedtime.               Home Infusion Instuctions  (From admission, onward)           Start     Ordered   03/18/23 0000  Home infusion instructions       Question:  Instructions  Answer:  Flushing of vascular access device: 0.9% NaCl pre/post medication administration and prn patency; Heparin 100 u/ml, 5ml for implanted ports and Heparin 10u/ml, 5ml for all other central venous catheters.   03/18/23 1025            Contact information for follow-up providers     Edmisten, Kristie L, PA. Go on 03/18/2023.   Specialty: Orthopedic Surgery Why: at 3:30pm Contact information: [address] [place] [address] [phone]              Contact information for after-discharge  care     Destination     [facility], [facility] Preferred SNF .   Service: Skilled Nursing Contact information: [address] [place] [address] [phone]                     Discharge Instructions     Diet - low sodium heart healthy   Complete by: As directed    Home infusion instructions   Complete by: As directed    Instructions: Flushing of vascular access device: 0.9% NaCl pre/post medication administration and prn patency; Heparin 100 u/ml, 5ml for implanted ports and Heparin 10u/ml, 5ml for all other central venous catheters.   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   1+ LE edema to mid shin bilaterally, somewhat more on right. Respiratory: Normal respiratory rate and rhythm.  CTAB without rales or wheezes. Abdominal: Abdomen soft and non-tender.  No distension or HSM. MSK:    Neuro/Psych: Strength symmetric in upper and lower extremities.  Judgment and insight appear at baseline, mildly impaired.   Condition at discharge: fair  The results of significant diagnostics from this hospitalization (including imaging, microbiology, ancillary and laboratory) are listed below for reference.   Imaging Studies: [name] EKG SITE RITE  Result Date: 03/17/2023 If Site Rite image not attached, placement could not be confirmed due to current cardiac rhythm.  ECHOCARDIOGRAM COMPLETE  Result  Date: 03/14/2023    ECHOCARDIOGRAM REPORT   Patient Name:   Kenneth Montoya Date of Exam: 03/14/2023 Medical Rec #:  [id number]        Height:       68.0 in Accession #:    [id number]       Weight:       209.0 lb Date of Birth:  1941-02-05  Physician Discharge Summary   Patient: Kenneth Montoya MRN: [id number] DOB: 22-Aug-1940  Admit date:     03/10/2023  Discharge date: 03/18/23  Discharge Physician: Alberteen Sam   PCP: Etta Grandchild, MD     Recommendations at discharge:  Follow up with Infectious Disease within 4 weeks for MSSA bacteremia Follow up with Dr. Lequita Halt within 2 weeks for staple removal and post-op check Continue cefazolin for 6 weeks, end-of-therapy 04/24/23 AFTER cefazolin, start cefadroxil 1g twice daily for additional six weeks, 04/25/23 to 06/06/23 Please obtain CBC/diff, CMP, and CRP weekly while on antibiotic therapy Hold losartan and resume if/when appropriate Adjust new Lasix based on Cr results     Discharge Diagnoses: Principal Problem:   MSSA bacteremia due to operative wound infection Active Problems:   Possible septic arthritis of the right hip   Possible closed right periprosthetic hip fracture, initial encounter (HCC)   Acute metabolic encephalopathy   Leg swelling, suspect Chronic diastolic CHF   Bladder outflow obstruction   Essential hypertension   Hyponatremia   Transient hypoxia, resolved   Adjustment disorder   Acute kidney injury   PVD (peripheral vascular disease) (HCC)   Normocytic anemia   Obesity (BMI 30-39.9)      Hospital Course: [name] is an 82 y.o. M with hx HTN, CAD, TIA, PVD carotid, HLD, and SIADH with chronic hyponatremia and recent right THA who presented with weakness and panic attacks.  In the ER, found to have redness and swelling of his incision, WBC 17K and confusion.  Admitted on cefazolin.  Blood cultures subsequently grew MSSA.    10/15 Blood culture x2: MSSA in 1/2 10/15 Urine culture: NG 10/17 surgical culture of soft tissue collection: MSSA 10/17 repeat blood culture x2: Abiotrophica defectiva in 1/2, likely contaminant 10/18 repeat blood culture x2: NGTD      * MSSA bacteremia due to operative wound infection Admitted  on antibiotics.  Orthopedics consulted.  Went to OR on 10/17 with Dr. Lequita Halt, debrided two fluid collections in the soft tissue, dissected down to the joint capsule, which appeared intact and so was not opened.    ID discussed with orthopedics.  It was felt that sampling the joint fluid was too risky, but that empiric treatment for presumed joint space involvement was prudent, with 12 weeks antibiotics (6 weeks cefazolin followed by 6 weeks cefadroxil).  Given presence of hardware, I am uncertain if ID/Ortho plan for longer term suppression or not.    clinically did not appear to be involvement of the hip joint.        Acute metabolic encephalopathy Patient appears to have some MCI maybe incipient dementia.  Here, he was initially disoriented and forgetful.  This improved with antibiotic treatment.     Likely chronic diastolic CHF Patient had some pitting edema in bilateral LEs.  No hypoxia, normal EF on echo.  Bilateral US showed no clot.  He was treated with Lasix here, still with some asymptomatic pitting edema and so discharged on oral Lasix in place of hydrochlorothiazide.  Recommend titration based on serial BMP.    Obesity (BMI 30-39.9) BMI 31  Normocytic anemia Attention on CBC/diff in setting of prolonged cephalosporin use.  PVD (peripheral vascular disease) (HCC) Cerebrovascular disease Coronary artery disease without angina On Lipitor, Plavix.   Acute kidney injury Cr stable with diuresis.  Had some mild congestive nephropathy.    Adjustment disorder Anxiety Duloxetine held given hyponatremia, restarted at discharge  Chronic hyponatremia Baseline is 125.  tamsulosin 0.4 MG Caps capsule Commonly known as: FLOMAX Take 0.4 mg by mouth at bedtime.   traZODone 50 MG tablet Commonly known as: DESYREL Take 25 mg by mouth at bedtime.               Home Infusion Instuctions  (From admission, onward)           Start     Ordered   03/18/23 0000  Home infusion instructions       Question:  Instructions  Answer:  Flushing of vascular access device: 0.9% NaCl pre/post medication administration and prn patency; Heparin 100 u/ml, 5ml for implanted ports and Heparin 10u/ml, 5ml for all other central venous catheters.   03/18/23 1025            Contact information for follow-up providers     Edmisten, Kristie L, PA. Go on 03/18/2023.   Specialty: Orthopedic Surgery Why: at 3:30pm Contact information: [address] [place] [address] [phone]              Contact information for after-discharge  care     Destination     [facility], [facility] Preferred SNF .   Service: Skilled Nursing Contact information: [address] [place] [address] [phone]                     Discharge Instructions     Diet - low sodium heart healthy   Complete by: As directed    Home infusion instructions   Complete by: As directed    Instructions: Flushing of vascular access device: 0.9% NaCl pre/post medication administration and prn patency; Heparin 100 u/ml, 5ml for implanted ports and Heparin 10u/ml, 5ml for all other central venous catheters.   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   1+ LE edema to mid shin bilaterally, somewhat more on right. Respiratory: Normal respiratory rate and rhythm.  CTAB without rales or wheezes. Abdominal: Abdomen soft and non-tender.  No distension or HSM. MSK:    Neuro/Psych: Strength symmetric in upper and lower extremities.  Judgment and insight appear at baseline, mildly impaired.   Condition at discharge: fair  The results of significant diagnostics from this hospitalization (including imaging, microbiology, ancillary and laboratory) are listed below for reference.   Imaging Studies: [name] EKG SITE RITE  Result Date: 03/17/2023 If Site Rite image not attached, placement could not be confirmed due to current cardiac rhythm.  ECHOCARDIOGRAM COMPLETE  Result  Date: 03/14/2023    ECHOCARDIOGRAM REPORT   Patient Name:   Kenneth Montoya Date of Exam: 03/14/2023 Medical Rec #:  [id number]        Height:       68.0 in Accession #:    [id number]       Weight:       209.0 lb Date of Birth:  1941-02-05  cava is dilated in size with greater than 50% respiratory  variability, suggesting right atrial pressure of 8 mmHg. IAS/Shunts: The interatrial septum was not well visualized.  LEFT VENTRICLE PLAX 2D LVIDd:         4.60 cm     Diastology LVIDs:         3.50 cm     LV e' medial:    5.98 cm/s LV PW:         1.00 cm     LV E/e' medial:  18.2 LV IVS:        1.10 cm     LV e' lateral:   10.28 cm/s LVOT diam:     2.30 cm     LV E/e' lateral: 10.6 LV SV:         80 LV SV Index:   39 LVOT Area:     4.15 cm  LV Volumes (MOD) LV vol d, MOD A2C: 76.7 ml LV vol d, MOD A4C: 63.0 ml LV vol s, MOD A2C: 52.0 ml LV vol s, MOD A4C: 32.2 ml LV SV MOD A2C:     24.7 ml LV SV MOD A4C:     63.0 ml LV SV MOD BP:      30.7 ml RIGHT VENTRICLE            IVC RV S prime:     9.68 cm/s  IVC diam: 2.30 cm TAPSE (M-mode): 1.7 cm LEFT ATRIUM             Index        RIGHT ATRIUM           Index LA diam:        3.70 cm 1.78 cm/m   RA Area:     16.70 cm LA Vol (A2C):   31.4 ml 15.08 ml/m  RA Volume:   39.40 ml  18.92 ml/m LA Vol (A4C):   37.2 ml 17.86 ml/m LA Biplane Vol: 34.2 ml 16.42 ml/m  AORTIC VALVE LVOT Vmax:   115.00 cm/s LVOT Vmean:  75.400 cm/s LVOT VTI:    0.193 m  AORTA Ao Root diam: 3.40 cm Ao Asc diam:  3.50 cm MITRAL VALVE                TRICUSPID VALVE MV Area (PHT): 3.91 cm     TR Peak grad:   30.9 mmHg MV Decel Time: 194 msec     TR Vmax:        278.00 cm/s MV E velocity: 109.00 cm/s MV A velocity: 127.00 cm/s  SHUNTS MV E/A ratio:  0.86         Systemic VTI:  0.19 m                             Systemic Diam: 2.30 cm Epifanio Lesches MD Electronically signed by Epifanio Lesches MD Signature Date/Time: 03/14/2023/5:01:32 PM    Final    VAS [name] LOWER EXTREMITY VENOUS (DVT)  Result Date: 03/12/2023  Lower Venous DVT Study Patient Name:  Kenneth Montoya  Date of Exam:   03/12/2023 Medical Rec #: [id number]         Accession #:    [id number] Date of Birth: Dec 29, 1940          Patient Gender: M Patient Age:   56 years Exam Location:  [facility] Procedure:      VAS [name]  LOWER EXTREMITY VENOUS (

## 2023-03-18 NOTE — Progress Notes (Signed)
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 To discharging provider:  please sign these orders via discharge navigator,  Select New Orders & click on the button choice - Manage This Unsigned Work.     Thank you for allowing pharmacy to be a part of this patient's care.  Rickey Primus, PharmD Candidate Class of 2025 03/18/2023, 10:00 AM

## 2023-03-19 LAB — CULTURE, BLOOD (ROUTINE X 2)
Culture: NO GROWTH
Culture: NO GROWTH
Special Requests: ADEQUATE
Special Requests: ADEQUATE

## 2023-03-21 LAB — CULTURE, BLOOD (ROUTINE X 2): Special Requests: ADEQUATE

## 2023-03-27 ENCOUNTER — Ambulatory Visit: Payer: 59 | Admitting: Internal Medicine

## 2023-04-04 LAB — MISC LABCORP TEST (SEND OUT): Labcorp test code: 8680

## 2023-04-08 ENCOUNTER — Other Ambulatory Visit: Payer: Self-pay

## 2023-04-08 ENCOUNTER — Telehealth: Payer: Self-pay

## 2023-04-08 ENCOUNTER — Ambulatory Visit (INDEPENDENT_AMBULATORY_CARE_PROVIDER_SITE_OTHER): Payer: Medicare Other | Admitting: Internal Medicine

## 2023-04-08 VITALS — BP 117/69 | HR 80 | Temp 98.3°F

## 2023-04-08 DIAGNOSIS — T8451XD Infection and inflammatory reaction due to internal right hip prosthesis, subsequent encounter: Secondary | ICD-10-CM

## 2023-04-08 DIAGNOSIS — B9561 Methicillin susceptible Staphylococcus aureus infection as the cause of diseases classified elsewhere: Secondary | ICD-10-CM | POA: Diagnosis not present

## 2023-04-08 DIAGNOSIS — T8450XD Infection and inflammatory reaction due to unspecified internal joint prosthesis, subsequent encounter: Secondary | ICD-10-CM

## 2023-04-08 DIAGNOSIS — R7881 Bacteremia: Secondary | ICD-10-CM | POA: Diagnosis not present

## 2023-04-08 DIAGNOSIS — T8149XA Infection following a procedure, other surgical site, initial encounter: Secondary | ICD-10-CM

## 2023-04-08 NOTE — Telephone Encounter (Signed)
Called [facility] to give verbal orders per Dr. Renold Don to stop IV abx and pull picc after last dose on 11/28 and on 11/29 start oral abx till 1/10. He verbalized understanding and let him know it is written on patient form as well.

## 2023-04-08 NOTE — Patient Instructions (Signed)
Make sure you see dr Gerri Spore (orthopedics) to follow up on your right hip   On 11/28, you will finish with the picc line and iv antibiotics; and you'll transition to oral antibiotics cefadroxil 1000 mg twice a day for 6 more weeks   See me in 4 weeks

## 2023-04-08 NOTE — Progress Notes (Signed)
[facility]  Patient Active Problem List   Diagnosis Date Noted   Obesity (BMI 30-39.9) 03/14/2023   Leg swelling 03/12/2023   Acute kidney injury 03/12/2023   PVD (peripheral vascular disease) (HCC) 03/12/2023   Normocytic anemia 03/12/2023   Transient hypoxia, resolved 03/11/2023   Possible closed right periprosthetic hip fracture, initial encounter (HCC) 03/11/2023   Acute metabolic encephalopathy 03/11/2023   MSSA bacteremia due to operative wound infection 03/11/2023   Adjustment disorder 03/11/2023   OA (osteoarthritis) of hip 02/12/2023   Osteoarthritis of right hip 02/12/2023   Lumbar stenosis 01/25/2022   TIA (transient ischemic attack) 01/18/2022   Allergic contact dermatitis due to drugs in contact with skin 10/09/2021   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/04/2021   SIADH (syndrome of inappropriate ADH production) (HCC) 01/10/2021   Hyponatremia 01/05/2021   Hyperlipidemia with target LDL less than 100 12/17/2020   Stroke (HCC) 12/09/2020   Cerebral infarction (HCC) 12/09/2020   Psychophysiological insomnia 11/21/2020   GAD (generalized anxiety disorder) 11/21/2020   BPH (benign prostatic hyperplasia)    Essential hypertension 11/08/2019   Gastroesophageal reflux disease without esophagitis 11/08/2019   Herpes simplex 11/08/2019   Primary erectile dysfunction 11/08/2019   Senile purpura (HCC) 05/13/2019   Chronic allergic rhinitis due to pollen 03/24/2017   LPRD (laryngopharyngeal reflux disease) 03/24/2017   Bladder outflow obstruction 03/04/2016      Subjective:    Patient ID: Kenneth Montoya, male    DOB: Jul 05, 1940, 82 y.o.   MRN: [id number]  Chief Complaint  Patient presents with   Follow-up    HPI:  Kenneth Montoya is a 82 y.o. male here for hospital follow up for right PJI hip arthroplasty surgical site infection and mssa bsi  Patient released 03/18/23 During admission has 1 of 2 set abiotrophia on bcx likely  contaminant as repeat bcx without tx showed clearance Not a rifampin candidate  He had I&D of the surgical site --> joint capsule noted intact but presumed involved after I discussed case with surgery    Discharged on planned cefazolin until 04/24/2023 and then transitioned to oral abx to finish 12 weeks treatment     Allergies  Allergen Reactions   Percocet [Oxycodone-Acetaminophen] Itching      Outpatient Medications Prior to Visit  Medication Sig Dispense Refill   amLODipine (NORVASC) 10 MG tablet TAKE 1 TABLET BY MOUTH AT  BEDTIME 90 tablet 3   ascorbic acid (VITAMIN C) 1000 MG tablet Take 1,000 mg by mouth at bedtime.     atorvastatin (LIPITOR) 10 MG tablet TAKE 1 TABLET(10 MG) BY MOUTH DAILY 90 tablet 0   carvedilol (COREG) 6.25 MG tablet Take 1 tablet (6.25 mg total) by mouth 2 (two) times daily. 180 tablet 3   ceFAZolin (ANCEF) IVPB Inject 2 g into the vein every 8 (eight) hours. 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 38 Units 0   clopidogrel (PLAVIX) 75 MG tablet TAKE 1 TABLET(75 MG) BY MOUTH DAILY 90 tablet 1   cyclobenzaprine (FLEXERIL) 5 MG tablet Take 2 tablets (10 mg total) by mouth 3 (three) times daily as needed for muscle  spasms. 40 tablet 0   DULoxetine (CYMBALTA) 30 MG capsule Take 1 capsule (30 mg total) by mouth daily. 30 capsule 5   fluticasone (FLONASE) 50 MCG/ACT nasal spray Place 2 sprays into both nostrils daily. 48 g 1   furosemide (LASIX) 40 MG tablet Take 1 tablet (40 mg total) by mouth daily.     HYDROcodone-acetaminophen (NORCO/VICODIN) 5-325 MG tablet Take 1-2 tablets by mouth every 6 (six) hours  as needed for severe pain (pain score 7-10). 42 tablet 0   loratadine (CLARITIN) 10 MG tablet Take 10 mg by mouth daily.     Melatonin 10 MG TABS Take by mouth.     naproxen (NAPROSYN) 250 MG tablet      olopatadine (PATANOL) 0.1 % ophthalmic solution Place 1 drop into both eyes at bedtime.     psyllium (METAMUCIL) 58.6 % powder Take 1 packet by mouth at bedtime.     RABEprazole (ACIPHEX) 20 MG tablet Take 1 tablet (20 mg total) by mouth daily. 90 tablet 1   tamsulosin (FLOMAX) 0.4 MG CAPS capsule Take 0.4 mg by mouth at bedtime.     traZODone (DESYREL) 50 MG tablet Take 25 mg by mouth at bedtime.     [START ON 04/25/2023] cefadroxil (DURICEF) 500 MG capsule Take 2 capsules (1,000 mg total) by mouth 2 (two) times daily. (Patient not taking: Reported on 04/08/2023)     PREVIDENT 5000 PLUS 1.1 % CREA dental cream Place 1 Application onto teeth in the morning and at bedtime. (Patient not taking: Reported on 04/08/2023)     No facility-administered medications prior to visit.     Social History   Socioeconomic History   Marital status: Married    Spouse name: Not on file   Number of children: Not on file   Years of education: Not on file   Highest education level: Not on file  Occupational History   Not on file  Tobacco Use   Smoking status: Never   Smokeless tobacco: Never  Vaping Use   Vaping status: Never Used  Substance and Sexual Activity   Alcohol use: Not Currently    Alcohol/week: 2.0 standard drinks of alcohol    Types: 2 Standard drinks or equivalent per week    Comment: wine 1-2 x a week and not every week    Drug use: Never   Sexual activity: Not Currently  Other Topics Concern   Not on file  Social History Narrative   Left handed   Social Determinants of Health   Financial Resource Strain: Low Risk  (03/06/2023)   Overall Financial Resource Strain (CARDIA)    Difficulty of Paying Living Expenses: Not hard at all  Food Insecurity: No Food Insecurity  (03/11/2023)   Hunger Vital Sign    Worried About Running Out of Food in the Last Year: Never true    Ran Out of Food in the Last Year: Never true  Transportation Needs: No Transportation Needs (03/11/2023)   PRAPARE - Administrator, Civil Service (Medical): No    Lack of Transportation (Non-Medical): No  Physical Activity: Inactive (03/06/2023)   Exercise Vital Sign    Days of Exercise per Week: 0 days    Minutes of Exercise per Session: 0 min  Stress: No Stress Concern Present (03/06/2023)   [employer] of Occupational Health - Occupational Stress Questionnaire    Feeling of Stress : Not at all  Social Connections: Socially Integrated (03/06/2023)   Social Connection and Isolation Panel [  NHANES]    Frequency of Communication with Friends and Family: More than three times a week    Frequency of Social Gatherings with Friends and Family: More than three times a week    Attends Religious Services: More than 4 times per year    Active Member of [employer] or Organizations: Yes    Attends Engineer, structural: More than 4 times per year    Marital Status: Married  Catering manager Violence: Not At Risk (03/11/2023)   Humiliation, Afraid, Rape, and Kick questionnaire    Fear of Current or Ex-Partner: No    Emotionally Abused: No    Physically Abused: No    Sexually Abused: No      Review of Systems    All other ros negative  Objective:    BP 117/69   Pulse 80   Temp 98.3 F (36.8 C) (Temporal)   SpO2 96%  Nursing note and vital signs reviewed.  Physical Exam     General/constitutional: no distress, pleasant -- in wheel chair HEENT: Normocephalic, PER, Conj Clear, EOMI, Oropharynx clear Neck supple CV: rrr no mrg Lungs: clear to auscultation, normal respiratory effort Abd: Soft, Nontender Ext: no edema Skin: No Rash Neuro: nonfocal MSK: no peripheral joint swelling/tenderness/warmth; back spines nontender --- right hip surgical site dressing  clean/dry; no tenderness on exam   Central line presence: right upper ext picc site no erythema/purulence   Labs: Lab Results  Component Value Date   WBC 15.0 (H) 03/18/2023   HGB 9.5 (L) 03/18/2023   HCT 28.9 (L) 03/18/2023   MCV 91.5 03/18/2023   PLT 282 03/18/2023   Last metabolic panel Lab Results  Component Value Date   GLUCOSE 126 (H) 03/18/2023   NA 124 (L) 03/18/2023   K 4.1 03/18/2023   CL 85 (L) 03/18/2023   CO2 26 03/18/2023   BUN 21 03/18/2023   CREATININE 1.04 03/18/2023   GFRNONAA >60 03/18/2023   CALCIUM 8.4 (L) 03/18/2023   PHOS 4.5 12/10/2020   PROT 5.4 (L) 03/14/2023   ALBUMIN 2.5 (L) 03/14/2023   BILITOT 0.9 03/14/2023   ALKPHOS 211 (H) 03/14/2023   AST 50 (H) 03/14/2023   ALT 17 03/14/2023   ANIONGAP 13 03/18/2023    Micro:  Serology:  Imaging:  Assessment & Plan:   Problem List Items Addressed This Visit       Other   MSSA bacteremia due to operative wound infection   Other Visit Diagnoses     Surgical site infection    -  Primary   Relevant Orders   COMPLETE METABOLIC PANEL WITH GFR   C-reactive protein   CBC w/Diff   Infection of prosthetic joint, subsequent encounter       Relevant Orders   CBC w/Diff         No orders of the defined types were placed in this encounter.  Antibiotics: Cefazolin 10/15-10/20; 10/21-present Ceftriaxone 10/20 Vancomycin 10/14  82 year old male admitted with: #MSSA bacteremia secondary to right hip surgical site infection, adjacent to right hip prosthetic joint  (done 02/12/23) - drainage and pain after fall after surgery -- admission CT showed nondisplaced fracture with large fluid collection concerning for hematoma versus abscess. -- admission bcx 10/15 mssa -- s/p I&D 10/17 -- joint capsule noted intact and no exploration done but exploration involved subfascial space -- repeat bcx 10/17 one of two set abiotrophia; repeat bcx 10/18 negative -- abx changed on 10/20 to ceftriaxone to  cover abiotrophia, but the  repeat bcx 10/18 had remained negative, suggesting 10/17 bcx was contaminant   ---------- 04/08/23 id clinic f/u Patient comes from nursing home Planned 12 weeks abx (6 weeks cefazolin then transitioned to cefadroxil 1000 mg po bid on 04/24/23) Doing pt/ot ok No sx from antbiotics He'll yet to see orthopedics in follow up Labs today  F/u with me in another 4 weeks   Follow-up: Return in about 4 weeks (around 05/06/2023).      Raymondo Band, MD [facility] 04/08/2023, 3:52 PM

## 2023-04-09 ENCOUNTER — Telehealth: Payer: Self-pay

## 2023-04-09 NOTE — Telephone Encounter (Signed)
-----   Message from [place] sent at 04/09/2023 10:40 AM EST ----- Wbc a little low He is on cefazolin Will continue to monitor   Just fyi thanks

## 2023-04-09 NOTE — Telephone Encounter (Signed)
Attempted to contact patient 2x - voicemail box full, unable to leave a message.    Nickoli Bagheri Lesli Albee, CMA

## 2023-04-10 LAB — COMPLETE METABOLIC PANEL WITH GFR
AG Ratio: 1.3 (calc) (ref 1.0–2.5)
ALT: 11 U/L (ref 9–46)
AST: 26 U/L (ref 10–35)
Albumin: 3.3 g/dL — ABNORMAL LOW (ref 3.6–5.1)
Alkaline phosphatase (APISO): 129 U/L (ref 35–144)
BUN: 10 mg/dL (ref 7–25)
CO2: 29 mmol/L (ref 20–32)
Calcium: 8.2 mg/dL — ABNORMAL LOW (ref 8.6–10.3)
Chloride: 87 mmol/L — ABNORMAL LOW (ref 98–110)
Creat: 1.06 mg/dL (ref 0.70–1.22)
Globulin: 2.5 g/dL (ref 1.9–3.7)
Glucose, Bld: 150 mg/dL — ABNORMAL HIGH (ref 65–99)
Potassium: 3.6 mmol/L (ref 3.5–5.3)
Sodium: 126 mmol/L — ABNORMAL LOW (ref 135–146)
Total Bilirubin: 0.4 mg/dL (ref 0.2–1.2)
Total Protein: 5.8 g/dL — ABNORMAL LOW (ref 6.1–8.1)
eGFR: 70 mL/min/{1.73_m2} (ref >=60)

## 2023-04-10 LAB — CBC WITH DIFFERENTIAL/PLATELET
Absolute Lymphocytes: 966 {cells}/uL (ref 850–3900)
Absolute Monocytes: 539 {cells}/uL (ref 200–950)
Basophils Absolute: 52 {cells}/uL (ref 0–200)
Basophils Relative: 1.8 %
Eosinophils Absolute: 377 {cells}/uL (ref 15–500)
Eosinophils Relative: 13 %
HCT: 27.6 % — ABNORMAL LOW (ref 38.5–50.0)
Hemoglobin: 9.2 g/dL — ABNORMAL LOW (ref 13.2–17.1)
MCH: 28.5 pg (ref 27.0–33.0)
MCHC: 33.3 g/dL (ref 32.0–36.0)
MCV: 85.4 fL (ref 80.0–100.0)
MPV: 8.8 fL (ref 7.5–12.5)
Monocytes Relative: 18.6 %
Neutro Abs: 966 {cells}/uL — ABNORMAL LOW (ref 1500–7800)
Neutrophils Relative %: 33.3 %
Platelets: 260 10*3/uL (ref 140–400)
RBC: 3.23 10*6/uL — ABNORMAL LOW (ref 4.20–5.80)
RDW: 13.8 % (ref 11.0–15.0)
Total Lymphocyte: 33.3 %
WBC: 2.9 10*3/uL — ABNORMAL LOW (ref 3.8–10.8)

## 2023-04-10 LAB — C-REACTIVE PROTEIN: CRP: 79.9 mg/L — ABNORMAL HIGH (ref <8.0)

## 2023-04-10 NOTE — Telephone Encounter (Signed)
Patient aware of results.

## 2023-04-14 LAB — FUNGAL ORGANISM REFLEX

## 2023-04-14 LAB — FUNGUS CULTURE WITH STAIN

## 2023-04-14 LAB — FUNGUS CULTURE RESULT

## 2023-04-17 ENCOUNTER — Other Ambulatory Visit: Payer: Self-pay | Admitting: *Deleted

## 2023-04-17 NOTE — Patient Outreach (Signed)
Post- Acute Care Manager follow up. Kenneth Montoya resides in [facility] skilled nursing facility.  Screening for potential chronic care management services as a benefit of health plan and primary care provider.  Facility site visit to [facility]. Met with therapy manager, [name] and [employer], Child psychotherapist. Kenneth Montoya plans to transition to ALF upon SNF discharge along with his spouse.   Appears Kenneth Montoya has been followed by ortho bundle care management.   [name], MSN, RN, BSN   [facility], [facility] RN Post- Acute Care Manager Direct Dial: [phone]

## 2023-04-25 IMAGING — CT CT HEAD W/O CM
4 series · 15 of 47 positions shown, 17 images · non-contrast
Comparison: Brain MRI [DATE]

CLINICAL DATA: Dizziness and change in vision.

EXAM:
CT HEAD WITHOUT CONTRAST
TECHNIQUE: Contiguous axial images were obtained from the base of the skull
through the vertex without intravenous contrast.

[Series 3: head wo · axial · 0.42mm/px · z∈[-88,+32]mm · 7 of 33 slices shown, 9 images]
[im 5/33  brain]
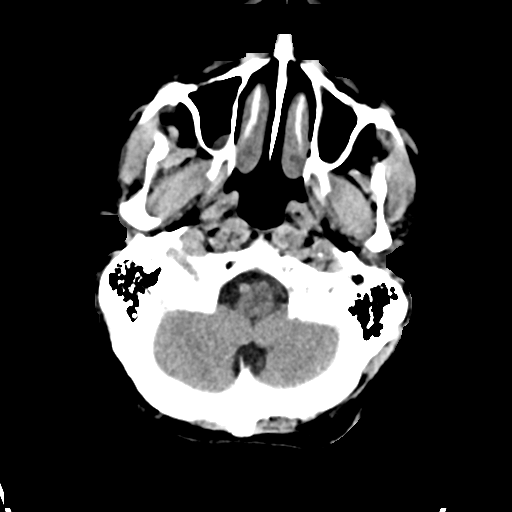
[im 5/33  bone]
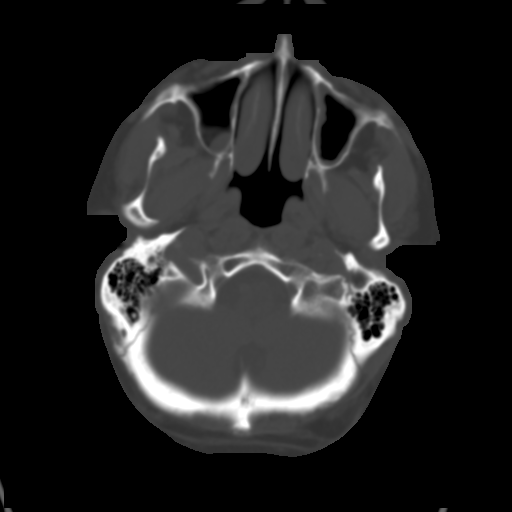
[im 9/33  brain]
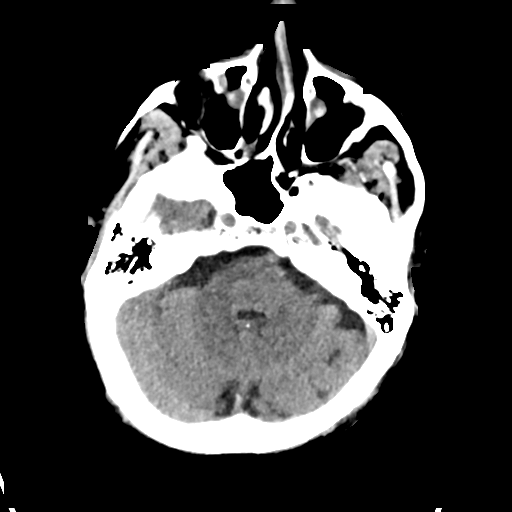
[im 13/33  brain]
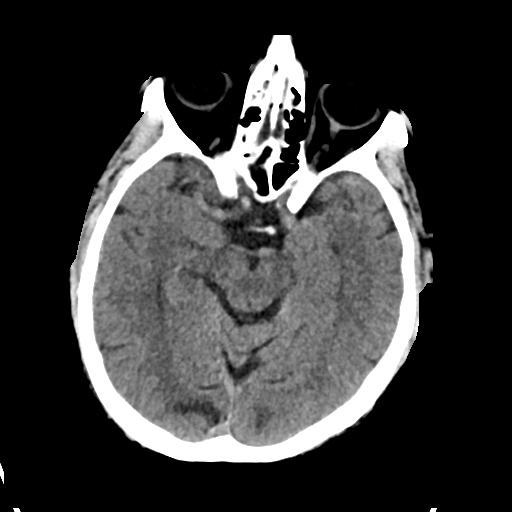
[im 17/33  brain]
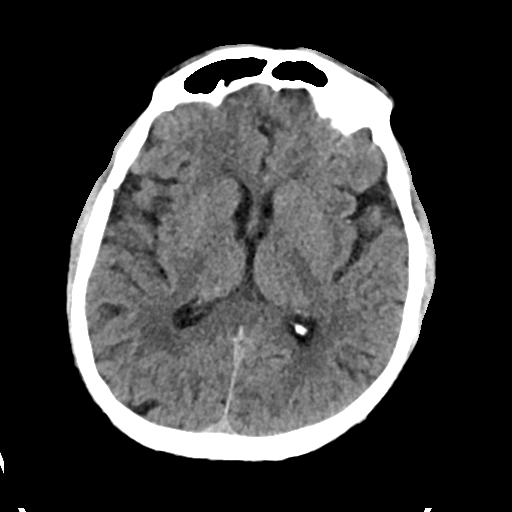
[im 21/33  brain]
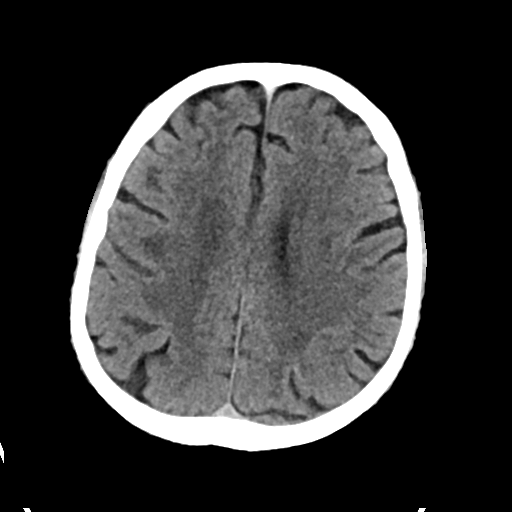
[im 21/33  bone]
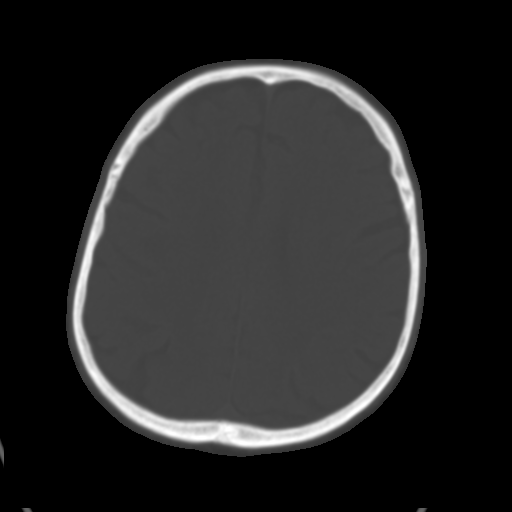
[im 25/33  brain]
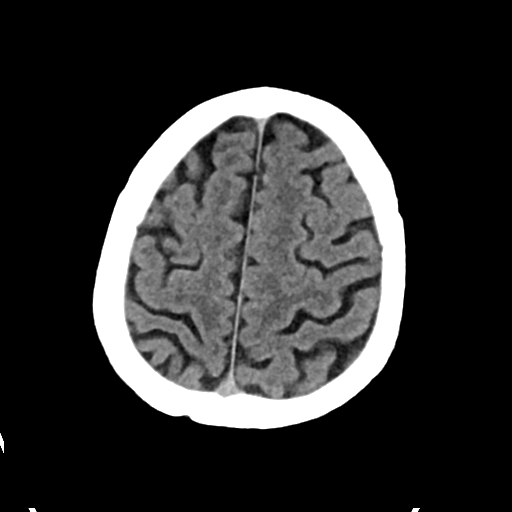
[im 29/33  brain]
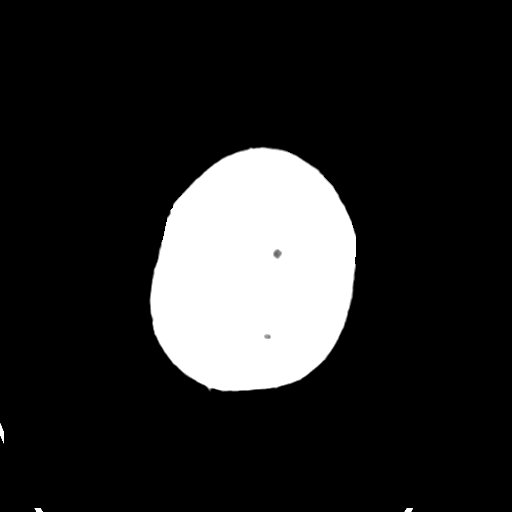

[Series 4: head bone · axial · 0.42mm/px · z∈[-92,-76]mm · 2 of 82 slices shown]
[im 9/82  bone]
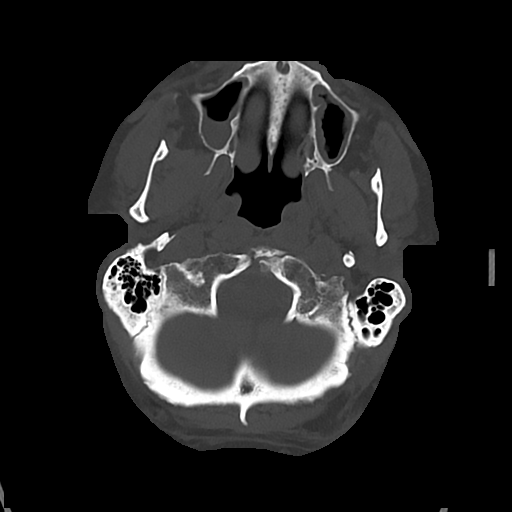
[im 17/82  bone]
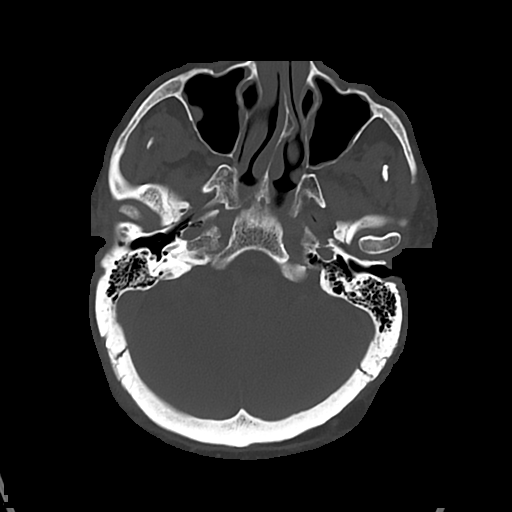

[Series 5: cor soft · coronal · 0.30mm/px · 3 of 73 slices shown]
[im 25/73  brain]
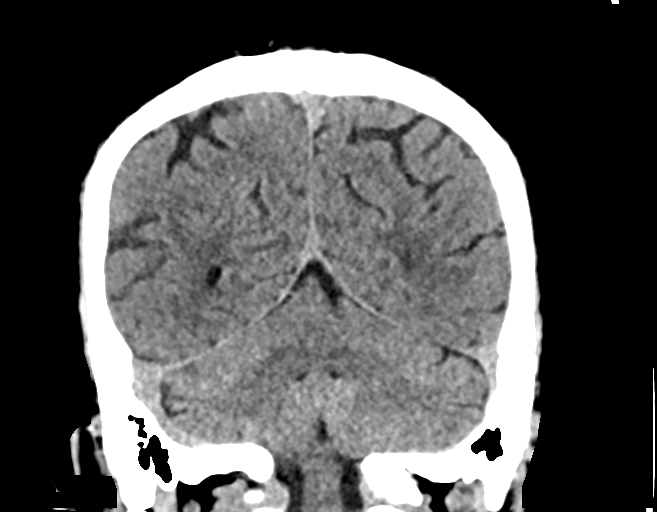
[im 33/73  brain]
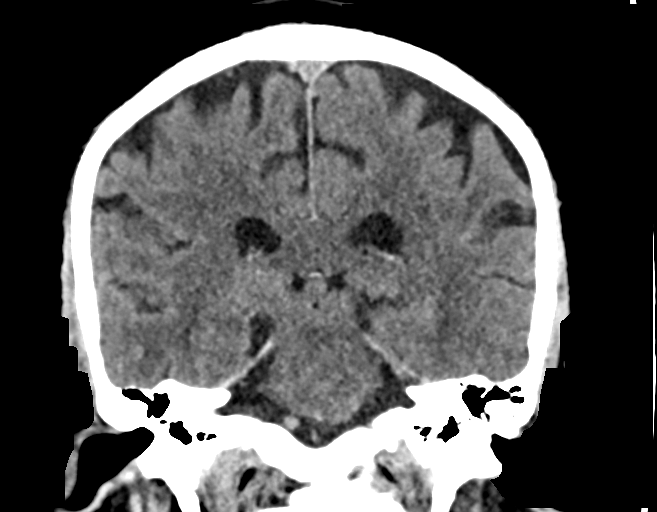
[im 41/73  brain]
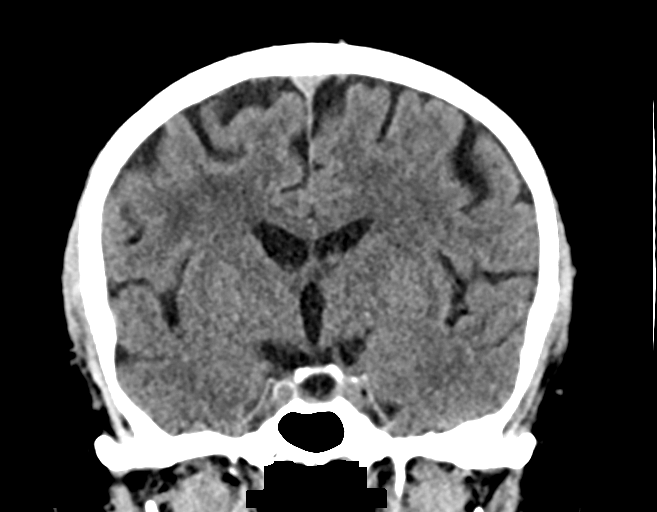

[Series 6: sag soft · sagittal · 0.29mm/px · 3 of 66 slices shown]
[im 22/66  brain]
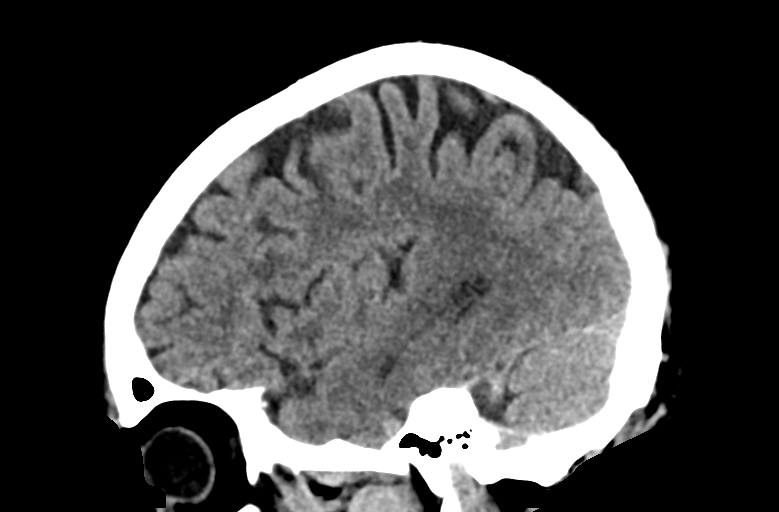
[im 33/66  brain]
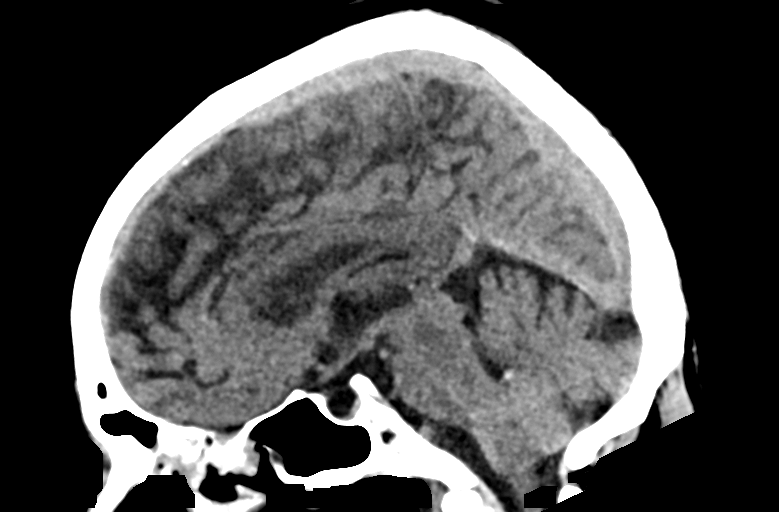
[im 44/66  brain]
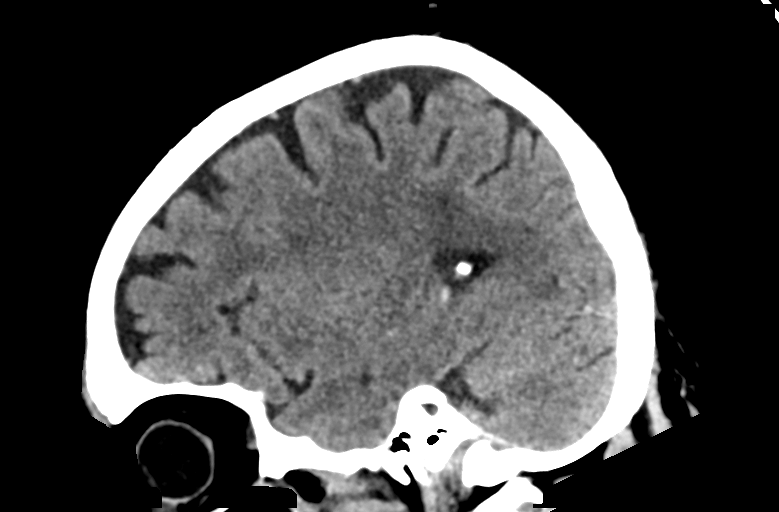

[15 of 47 positions shown; findings below may reference images not displayed]

FINDINGS: Brain: No evidence of acute infarction, hemorrhage, hydrocephalus,
extra-axial collection or mass lesion/mass effect. Area of
encephalomalacia in the right occipital lobe from prior known
ischemic infarct. Similar small area of encephalomalacia in the
posterior right frontal lobe. Deep white matter microangiopathy.

Vascular: No hyperdense vessel or unexpected calcification.

Skull: Normal. Negative for fracture or focal lesion.

Sinuses/Orbits: Chronic sinusitis of the ethmoid, maxillary and
frontal sinuses.

Other: None.
IMPRESSION: 1. No acute intracranial abnormality.
2. Encephalomalacia in the right occipital lobe from prior known
ischemic infarct.
3. Chronic small area of encephalomalacia in the posterior right
frontal lobe.
4. Deep white matter microangiopathy.
5. Chronic sinusitis.

## 2023-04-26 ENCOUNTER — Other Ambulatory Visit: Payer: Self-pay

## 2023-04-26 ENCOUNTER — Emergency Department (HOSPITAL_COMMUNITY): Payer: Medicare Other

## 2023-04-26 ENCOUNTER — Inpatient Hospital Stay (HOSPITAL_COMMUNITY)
Admission: EM | Admit: 2023-04-26 | Discharge: 2023-05-28 | DRG: 085 | Disposition: E | Payer: Medicare Other | Source: Skilled Nursing Facility | Attending: Internal Medicine | Admitting: Internal Medicine

## 2023-04-26 ENCOUNTER — Inpatient Hospital Stay (HOSPITAL_COMMUNITY): Payer: Medicare Other

## 2023-04-26 DIAGNOSIS — E876 Hypokalemia: Secondary | ICD-10-CM | POA: Diagnosis not present

## 2023-04-26 DIAGNOSIS — F419 Anxiety disorder, unspecified: Secondary | ICD-10-CM | POA: Diagnosis present

## 2023-04-26 DIAGNOSIS — I739 Peripheral vascular disease, unspecified: Secondary | ICD-10-CM | POA: Diagnosis present

## 2023-04-26 DIAGNOSIS — Y92129 Unspecified place in nursing home as the place of occurrence of the external cause: Secondary | ICD-10-CM | POA: Diagnosis not present

## 2023-04-26 DIAGNOSIS — Z9079 Acquired absence of other genital organ(s): Secondary | ICD-10-CM

## 2023-04-26 DIAGNOSIS — R0902 Hypoxemia: Secondary | ICD-10-CM | POA: Diagnosis not present

## 2023-04-26 DIAGNOSIS — R410 Disorientation, unspecified: Secondary | ICD-10-CM

## 2023-04-26 DIAGNOSIS — I5022 Chronic systolic (congestive) heart failure: Secondary | ICD-10-CM | POA: Diagnosis present

## 2023-04-26 DIAGNOSIS — I609 Nontraumatic subarachnoid hemorrhage, unspecified: Secondary | ICD-10-CM | POA: Diagnosis not present

## 2023-04-26 DIAGNOSIS — Z66 Do not resuscitate: Secondary | ICD-10-CM | POA: Diagnosis present

## 2023-04-26 DIAGNOSIS — S0636AD Traumatic hemorrhage of cerebrum, unspecified, with loss of consciousness status unknown, subsequent encounter: Secondary | ICD-10-CM | POA: Diagnosis not present

## 2023-04-26 DIAGNOSIS — L89151 Pressure ulcer of sacral region, stage 1: Secondary | ICD-10-CM | POA: Diagnosis present

## 2023-04-26 DIAGNOSIS — S0636AA Traumatic hemorrhage of cerebrum, unspecified, with loss of consciousness status unknown, initial encounter: Principal | ICD-10-CM

## 2023-04-26 DIAGNOSIS — Z86718 Personal history of other venous thrombosis and embolism: Secondary | ICD-10-CM

## 2023-04-26 DIAGNOSIS — Z96612 Presence of left artificial shoulder joint: Secondary | ICD-10-CM | POA: Diagnosis present

## 2023-04-26 DIAGNOSIS — E871 Hypo-osmolality and hyponatremia: Secondary | ICD-10-CM | POA: Diagnosis not present

## 2023-04-26 DIAGNOSIS — I110 Hypertensive heart disease with heart failure: Secondary | ICD-10-CM | POA: Diagnosis present

## 2023-04-26 DIAGNOSIS — Z885 Allergy status to narcotic agent status: Secondary | ICD-10-CM

## 2023-04-26 DIAGNOSIS — G9349 Other encephalopathy: Secondary | ICD-10-CM | POA: Diagnosis present

## 2023-04-26 DIAGNOSIS — K219 Gastro-esophageal reflux disease without esophagitis: Secondary | ICD-10-CM | POA: Diagnosis present

## 2023-04-26 DIAGNOSIS — N138 Other obstructive and reflux uropathy: Secondary | ICD-10-CM | POA: Diagnosis present

## 2023-04-26 DIAGNOSIS — Z515 Encounter for palliative care: Secondary | ICD-10-CM

## 2023-04-26 DIAGNOSIS — E222 Syndrome of inappropriate secretion of antidiuretic hormone: Secondary | ICD-10-CM | POA: Diagnosis present

## 2023-04-26 DIAGNOSIS — R1310 Dysphagia, unspecified: Secondary | ICD-10-CM | POA: Diagnosis present

## 2023-04-26 DIAGNOSIS — R569 Unspecified convulsions: Secondary | ICD-10-CM | POA: Diagnosis present

## 2023-04-26 DIAGNOSIS — J309 Allergic rhinitis, unspecified: Secondary | ICD-10-CM | POA: Diagnosis present

## 2023-04-26 DIAGNOSIS — R54 Age-related physical debility: Secondary | ICD-10-CM | POA: Diagnosis present

## 2023-04-26 DIAGNOSIS — E43 Unspecified severe protein-calorie malnutrition: Secondary | ICD-10-CM | POA: Diagnosis not present

## 2023-04-26 DIAGNOSIS — M1990 Unspecified osteoarthritis, unspecified site: Secondary | ICD-10-CM | POA: Diagnosis present

## 2023-04-26 DIAGNOSIS — E669 Obesity, unspecified: Secondary | ICD-10-CM | POA: Diagnosis present

## 2023-04-26 DIAGNOSIS — S020XXA Fracture of vault of skull, initial encounter for closed fracture: Secondary | ICD-10-CM | POA: Diagnosis present

## 2023-04-26 DIAGNOSIS — Z7189 Other specified counseling: Secondary | ICD-10-CM | POA: Diagnosis not present

## 2023-04-26 DIAGNOSIS — R296 Repeated falls: Secondary | ICD-10-CM | POA: Diagnosis present

## 2023-04-26 DIAGNOSIS — Z886 Allergy status to analgesic agent status: Secondary | ICD-10-CM

## 2023-04-26 DIAGNOSIS — N401 Enlarged prostate with lower urinary tract symptoms: Secondary | ICD-10-CM | POA: Diagnosis present

## 2023-04-26 DIAGNOSIS — S065X0A Traumatic subdural hemorrhage without loss of consciousness, initial encounter: Secondary | ICD-10-CM | POA: Diagnosis present

## 2023-04-26 DIAGNOSIS — Z6830 Body mass index (BMI) 30.0-30.9, adult: Secondary | ICD-10-CM

## 2023-04-26 DIAGNOSIS — Z823 Family history of stroke: Secondary | ICD-10-CM

## 2023-04-26 DIAGNOSIS — R339 Retention of urine, unspecified: Secondary | ICD-10-CM | POA: Diagnosis not present

## 2023-04-26 DIAGNOSIS — R338 Other retention of urine: Secondary | ICD-10-CM | POA: Diagnosis present

## 2023-04-26 DIAGNOSIS — J690 Pneumonitis due to inhalation of food and vomit: Secondary | ICD-10-CM | POA: Diagnosis not present

## 2023-04-26 DIAGNOSIS — Z8673 Personal history of transient ischemic attack (TIA), and cerebral infarction without residual deficits: Secondary | ICD-10-CM

## 2023-04-26 DIAGNOSIS — M009 Pyogenic arthritis, unspecified: Secondary | ICD-10-CM | POA: Diagnosis present

## 2023-04-26 DIAGNOSIS — R509 Fever, unspecified: Secondary | ICD-10-CM | POA: Diagnosis not present

## 2023-04-26 DIAGNOSIS — R739 Hyperglycemia, unspecified: Secondary | ICD-10-CM | POA: Diagnosis present

## 2023-04-26 DIAGNOSIS — E66811 Obesity, class 1: Secondary | ICD-10-CM | POA: Diagnosis present

## 2023-04-26 DIAGNOSIS — S066X0A Traumatic subarachnoid hemorrhage without loss of consciousness, initial encounter: Secondary | ICD-10-CM | POA: Diagnosis present

## 2023-04-26 DIAGNOSIS — W19XXXA Unspecified fall, initial encounter: Secondary | ICD-10-CM | POA: Diagnosis present

## 2023-04-26 DIAGNOSIS — G4700 Insomnia, unspecified: Secondary | ICD-10-CM | POA: Diagnosis present

## 2023-04-26 DIAGNOSIS — I959 Hypotension, unspecified: Secondary | ICD-10-CM | POA: Diagnosis not present

## 2023-04-26 DIAGNOSIS — Z96611 Presence of right artificial shoulder joint: Secondary | ICD-10-CM | POA: Diagnosis present

## 2023-04-26 DIAGNOSIS — D638 Anemia in other chronic diseases classified elsewhere: Secondary | ICD-10-CM | POA: Diagnosis present

## 2023-04-26 DIAGNOSIS — S06360A Traumatic hemorrhage of cerebrum, unspecified, without loss of consciousness, initial encounter: Principal | ICD-10-CM | POA: Diagnosis present

## 2023-04-26 DIAGNOSIS — R6510 Systemic inflammatory response syndrome (SIRS) of non-infectious origin without acute organ dysfunction: Secondary | ICD-10-CM | POA: Diagnosis not present

## 2023-04-26 DIAGNOSIS — Z96653 Presence of artificial knee joint, bilateral: Secondary | ICD-10-CM | POA: Diagnosis present

## 2023-04-26 DIAGNOSIS — Z79899 Other long term (current) drug therapy: Secondary | ICD-10-CM

## 2023-04-26 DIAGNOSIS — R0603 Acute respiratory distress: Secondary | ICD-10-CM | POA: Diagnosis not present

## 2023-04-26 DIAGNOSIS — Z96641 Presence of right artificial hip joint: Secondary | ICD-10-CM | POA: Diagnosis present

## 2023-04-26 DIAGNOSIS — Z83719 Family history of colon polyps, unspecified: Secondary | ICD-10-CM

## 2023-04-26 DIAGNOSIS — Z7902 Long term (current) use of antithrombotics/antiplatelets: Secondary | ICD-10-CM

## 2023-04-26 DIAGNOSIS — I2510 Atherosclerotic heart disease of native coronary artery without angina pectoris: Secondary | ICD-10-CM | POA: Diagnosis present

## 2023-04-26 DIAGNOSIS — I10 Essential (primary) hypertension: Secondary | ICD-10-CM | POA: Diagnosis not present

## 2023-04-26 DIAGNOSIS — R627 Adult failure to thrive: Secondary | ICD-10-CM | POA: Diagnosis not present

## 2023-04-26 LAB — COMPREHENSIVE METABOLIC PANEL
ALT: 12 U/L (ref 0–44)
AST: 28 U/L (ref 15–41)
Albumin: 2.9 g/dL — ABNORMAL LOW (ref 3.5–5.0)
Alkaline Phosphatase: 106 U/L (ref 38–126)
Anion gap: 14 (ref 5–15)
BUN: 13 mg/dL (ref 8–23)
CO2: 22 mmol/L (ref 22–32)
Calcium: 8.6 mg/dL — ABNORMAL LOW (ref 8.9–10.3)
Chloride: 90 mmol/L — ABNORMAL LOW (ref 98–111)
Creatinine, Ser: 1.1 mg/dL (ref 0.61–1.24)
GFR, Estimated: >60 mL/min (ref >60)
Glucose, Bld: 142 mg/dL — ABNORMAL HIGH (ref 70–99)
Potassium: 3.7 mmol/L (ref 3.5–5.1)
Sodium: 126 mmol/L — ABNORMAL LOW (ref 135–145)
Total Bilirubin: 0.8 mg/dL (ref <1.2)
Total Protein: 6.9 g/dL (ref 6.5–8.1)

## 2023-04-26 LAB — CBC
HCT: 30.5 % — ABNORMAL LOW (ref 39.0–52.0)
Hemoglobin: 9.8 g/dL — ABNORMAL LOW (ref 13.0–17.0)
MCH: 26.8 pg (ref 26.0–34.0)
MCHC: 32.1 g/dL (ref 30.0–36.0)
MCV: 83.3 fL (ref 80.0–100.0)
Platelets: 323 10*3/uL (ref 150–400)
RBC: 3.66 MIL/uL — ABNORMAL LOW (ref 4.22–5.81)
RDW: 15.3 % (ref 11.5–15.5)
WBC: 16.2 10*3/uL — ABNORMAL HIGH (ref 4.0–10.5)
nRBC: 0 % (ref 0.0–0.2)

## 2023-04-26 LAB — I-STAT CG4 LACTIC ACID, ED: Lactic Acid, Venous: 1.1 mmol/L (ref 0.5–1.9)

## 2023-04-26 LAB — URINALYSIS, ROUTINE W REFLEX MICROSCOPIC
Bilirubin Urine: NEGATIVE
Glucose, UA: NEGATIVE mg/dL
Hgb urine dipstick: NEGATIVE
Ketones, ur: NEGATIVE mg/dL
Leukocytes,Ua: NEGATIVE
Nitrite: NEGATIVE
Protein, ur: NEGATIVE mg/dL
Specific Gravity, Urine: 1.005 (ref 1.005–1.030)
pH: 7 (ref 5.0–8.0)

## 2023-04-26 LAB — HEMOGLOBIN A1C
Hgb A1c MFr Bld: 5.9 % — ABNORMAL HIGH (ref 4.8–5.6)
Mean Plasma Glucose: 122.63 mg/dL

## 2023-04-26 LAB — I-STAT CHEM 8, ED
BUN: 13 mg/dL (ref 8–23)
Calcium, Ion: 1.02 mmol/L — ABNORMAL LOW (ref 1.15–1.40)
Chloride: 90 mmol/L — ABNORMAL LOW (ref 98–111)
Creatinine, Ser: 0.9 mg/dL (ref 0.61–1.24)
Glucose, Bld: 146 mg/dL — ABNORMAL HIGH (ref 70–99)
HCT: 31 % — ABNORMAL LOW (ref 39.0–52.0)
Hemoglobin: 10.5 g/dL — ABNORMAL LOW (ref 13.0–17.0)
Potassium: 3.8 mmol/L (ref 3.5–5.1)
Sodium: 126 mmol/L — ABNORMAL LOW (ref 135–145)
TCO2: 27 mmol/L (ref 22–32)

## 2023-04-26 LAB — PROTIME-INR
INR: 1.1 (ref 0.8–1.2)
Prothrombin Time: 14.5 s (ref 11.4–15.2)

## 2023-04-26 LAB — MRSA NEXT GEN BY PCR, NASAL: MRSA by PCR Next Gen: NOT DETECTED

## 2023-04-26 LAB — GLUCOSE, CAPILLARY
Glucose-Capillary: 147 mg/dL — ABNORMAL HIGH (ref 70–99)
Glucose-Capillary: 150 mg/dL — ABNORMAL HIGH (ref 70–99)
Glucose-Capillary: 125 mg/dL — ABNORMAL HIGH (ref 70–99)

## 2023-04-26 MED ORDER — LORATADINE 10 MG PO TABS
10.0000 mg | ORAL_TABLET | Freq: Every day | ORAL | Status: DC
  Administered 2023-04-27: 10 mg via ORAL
  Filled 2023-04-26: qty 1

## 2023-04-26 MED ORDER — FLUTICASONE PROPIONATE 50 MCG/ACT NA SUSP
2.0000 | Freq: Every day | NASAL | Status: DC
  Administered 2023-04-27 – 2023-04-28 (×2): 2 via NASAL
  Filled 2023-04-26: qty 16

## 2023-04-26 MED ORDER — CLEVIDIPINE BUTYRATE 0.5 MG/ML IV EMUL
0.0000 mg/h | INTRAVENOUS | Status: DC
  Administered 2023-04-26: 9 mg/h via INTRAVENOUS
  Administered 2023-04-26: 2 mg/h via INTRAVENOUS
  Administered 2023-04-27: 5 mg/h via INTRAVENOUS
  Filled 2023-04-26 (×2): qty 50
  Filled 2023-04-26: qty 100

## 2023-04-26 MED ORDER — CEFADROXIL 500 MG PO CAPS
1000.0000 mg | ORAL_CAPSULE | Freq: Two times a day (BID) | ORAL | Status: DC
  Administered 2023-04-26 – 2023-04-27 (×2): 1000 mg via ORAL
  Filled 2023-04-26 (×7): qty 2

## 2023-04-26 MED ORDER — CARVEDILOL 3.125 MG PO TABS
6.2500 mg | ORAL_TABLET | Freq: Two times a day (BID) | ORAL | Status: DC
  Administered 2023-04-26 – 2023-04-27 (×2): 6.25 mg via ORAL
  Filled 2023-04-26 (×2): qty 2

## 2023-04-26 MED ORDER — INSULIN ASPART 100 UNIT/ML IJ SOLN
0.0000 [IU] | INTRAMUSCULAR | Status: DC
  Administered 2023-04-26 – 2023-04-28 (×8): 1 [IU] via SUBCUTANEOUS

## 2023-04-26 MED ORDER — ONDANSETRON HCL 4 MG/2ML IJ SOLN
4.0000 mg | Freq: Four times a day (QID) | INTRAMUSCULAR | Status: DC | PRN
  Administered 2023-04-27: 4 mg via INTRAVENOUS
  Filled 2023-04-26: qty 2

## 2023-04-26 MED ORDER — TAMSULOSIN HCL 0.4 MG PO CAPS
0.4000 mg | ORAL_CAPSULE | Freq: Every day | ORAL | Status: DC
  Administered 2023-04-26 – 2023-04-27 (×2): 0.4 mg via ORAL
  Filled 2023-04-26 (×2): qty 1

## 2023-04-26 MED ORDER — HYDROCERIN EX CREA
1.0000 | TOPICAL_CREAM | Freq: Two times a day (BID) | CUTANEOUS | Status: DC
  Administered 2023-04-26 – 2023-05-08 (×23): 1 via TOPICAL
  Filled 2023-04-26: qty 113

## 2023-04-26 MED ORDER — PSYLLIUM 95 % PO PACK
1.0000 | PACK | Freq: Every day | ORAL | Status: DC
  Administered 2023-04-26: 1 via ORAL
  Filled 2023-04-26 (×2): qty 1

## 2023-04-26 MED ORDER — SODIUM CHLORIDE 1 G PO TABS
1.0000 g | ORAL_TABLET | Freq: Two times a day (BID) | ORAL | Status: DC
  Administered 2023-04-27 (×2): 1 g via ORAL
  Filled 2023-04-26 (×2): qty 1

## 2023-04-26 MED ORDER — POLYETHYLENE GLYCOL 3350 17 G PO PACK: 17.0000 g | PACK | Freq: Every day | ORAL | Status: DC | PRN

## 2023-04-26 MED ORDER — LABETALOL HCL 5 MG/ML IV SOLN
10.0000 mg | Freq: Once | INTRAVENOUS | Status: AC
  Administered 2023-04-26: 10 mg via INTRAVENOUS
  Filled 2023-04-26: qty 4

## 2023-04-26 MED ORDER — FAMOTIDINE 20 MG PO TABS
20.0000 mg | ORAL_TABLET | Freq: Two times a day (BID) | ORAL | Status: DC
  Administered 2023-04-26 – 2023-04-27 (×2): 20 mg via ORAL
  Filled 2023-04-26 (×2): qty 1

## 2023-04-26 MED ORDER — AMLODIPINE BESYLATE 10 MG PO TABS
10.0000 mg | ORAL_TABLET | Freq: Every day | ORAL | Status: DC
  Administered 2023-04-26: 10 mg via ORAL
  Filled 2023-04-26: qty 1

## 2023-04-26 MED ORDER — CHLORHEXIDINE GLUCONATE CLOTH 2 % EX PADS
6.0000 | MEDICATED_PAD | Freq: Every day | CUTANEOUS | Status: DC
  Administered 2023-04-26 – 2023-04-28 (×3): 6 via TOPICAL

## 2023-04-26 MED ORDER — DOCUSATE SODIUM 100 MG PO CAPS: 100.0000 mg | ORAL_CAPSULE | Freq: Two times a day (BID) | ORAL | Status: DC | PRN

## 2023-04-26 MED ORDER — HYDROCODONE-ACETAMINOPHEN 5-325 MG PO TABS: 1.0000 | ORAL_TABLET | Freq: Four times a day (QID) | ORAL | Status: DC | PRN

## 2023-04-26 MED ORDER — CEFADROXIL 500 MG PO CAPS: 1000.0000 mg | ORAL_CAPSULE | Freq: Two times a day (BID) | ORAL | Status: DC

## 2023-04-26 MED ORDER — CALCIUM GLUCONATE-NACL 1-0.675 GM/50ML-% IV SOLN
1.0000 g | Freq: Once | INTRAVENOUS | Status: AC
  Administered 2023-04-26: 1000 mg via INTRAVENOUS
  Filled 2023-04-26: qty 50

## 2023-04-26 MED ORDER — ACETAMINOPHEN 325 MG PO TABS: 650.0000 mg | ORAL_TABLET | ORAL | Status: DC | PRN

## 2023-04-26 MED ORDER — OLOPATADINE HCL 0.1 % OP SOLN
1.0000 [drp] | Freq: Every day | OPHTHALMIC | Status: DC
  Administered 2023-04-26 – 2023-05-07 (×12): 1 [drp] via OPHTHALMIC
  Filled 2023-04-26: qty 5

## 2023-04-26 MED ORDER — MELATONIN 3 MG PO TABS
3.0000 mg | ORAL_TABLET | Freq: Every day | ORAL | Status: DC
  Administered 2023-04-26: 3 mg via ORAL
  Filled 2023-04-26: qty 1

## 2023-04-26 MED ORDER — PANTOPRAZOLE SODIUM 40 MG PO TBEC
40.0000 mg | DELAYED_RELEASE_TABLET | Freq: Every day | ORAL | Status: DC
  Administered 2023-04-27: 40 mg via ORAL
  Filled 2023-04-26: qty 1

## 2023-04-26 MED ORDER — FUROSEMIDE 40 MG PO TABS
40.0000 mg | ORAL_TABLET | Freq: Every day | ORAL | Status: DC
  Administered 2023-04-27: 40 mg via ORAL
  Filled 2023-04-26: qty 1

## 2023-04-26 MED ORDER — LIDOCAINE HCL URETHRAL/MUCOSAL 2 % EX GEL
1.0000 | Freq: Once | CUTANEOUS | Status: AC
  Administered 2023-04-27: 1 via URETHRAL

## 2023-04-26 MED ORDER — ORAL CARE MOUTH RINSE: 15.0000 mL | OROMUCOSAL | Status: DC | PRN

## 2023-04-26 MED ORDER — DULOXETINE HCL 30 MG PO CPEP
30.0000 mg | ORAL_CAPSULE | Freq: Every day | ORAL | Status: DC
  Administered 2023-04-27: 30 mg via ORAL
  Filled 2023-04-26 (×2): qty 1

## 2023-04-26 MED ORDER — VITAMIN C 500 MG PO TABS
1000.0000 mg | ORAL_TABLET | Freq: Every day | ORAL | Status: DC
  Administered 2023-04-26: 1000 mg via ORAL
  Filled 2023-04-26: qty 2

## 2023-04-26 MED ORDER — FERROUS SULFATE 325 (65 FE) MG PO TABS
325.0000 mg | ORAL_TABLET | Freq: Every day | ORAL | Status: DC
  Administered 2023-04-27: 325 mg via ORAL
  Filled 2023-04-26: qty 1

## 2023-04-26 MED ORDER — ATORVASTATIN CALCIUM 10 MG PO TABS
10.0000 mg | ORAL_TABLET | Freq: Every day | ORAL | Status: DC
  Administered 2023-04-27: 10 mg via ORAL
  Filled 2023-04-26: qty 1

## 2023-04-26 NOTE — Progress Notes (Signed)
   04/26/23 1306  Spiritual Encounters  Type of Visit Initial  Care provided to: Patient  Conversation partners present during encounter Nurse  Referral source Trauma page  Reason for visit Trauma  OnCall Visit Yes  Spiritual Framework  Presenting Themes Impactful experiences and emotions  Community/Connection Family  Patient Stress Factors Health changes  Family Stress Factors Not reviewed  Interventions  Spiritual Care Interventions Made Established relationship of care and support;Compassionate presence  Intervention Outcomes  Outcomes Reduced fear;Reduced isolation;Awareness of support  Spiritual Care Plan  Spiritual Care Issues Still Outstanding No further spiritual care needs at this time (see row info)   Chaplain responded to level 2 trauma fall on thinners. EMS informed chaplain that the patient's daughter would be coming to the hospital. Chaplain intruduced herself to the patient who had no needs. Chaplain waited for daughter in the ED waiting area.   Arlyce Dice, Chaplain Resident [phone]

## 2023-04-26 NOTE — ED Notes (Signed)
ED TO INPATIENT HANDOFF REPORT  ED Nurse Name and Phone #: Dwana Melena [phone]  S Name/Age/Gender Kenneth Montoya 82 y.o. male Room/Bed: 004C/004C  Code Status   Code Status: Full Code  Home/SNF/Other Home Patient oriented to: self, place, time, and situation Is this baseline? Yes   Triage Complete: Triage complete  Chief Complaint SAH (subarachnoid hemorrhage) (HCC) [I60.9]  Triage Note Pt BIB [facility] from [facility] d/t an unwitnessed fall around 5;45 this morning. The pt heard the pt fell. Pt's baseline is A&O X4, family noticed he is not acting like his normal self around 11 am. Pt was taken to Xray and therapy after the incident.  A hematoma noticed on the back of his head.  Pt is on Plavix for dvt.   168/82 Hr 90  Cbg 203  O2 95%  Rr 18    Allergies Allergies  Allergen Reactions   Percocet [Oxycodone-Acetaminophen] Itching    Level of Care/Admitting Diagnosis ED Disposition     ED Disposition  Admit   Condition  --   Comment  Hospital Area: [facility] [[id number]]  Level of Care: ICU [6]  May admit patient to [facility] or [facility] if equivalent level of care is available:: No  Covid Evaluation: Asymptomatic - no recent exposure (last 10 days) testing not required  Diagnosis: SAH (subarachnoid hemorrhage) [facility]) [[id number]]  Admitting Physician: Steffanie Dunn [[id number]]  Attending Physician: Steffanie Dunn [[id number]]  Certification:: I certify this patient will need inpatient services for at least 2 midnights  Expected Medical Readiness: 05/03/2023          B Medical/Surgery History Past Medical History:  Diagnosis Date   Anxiety    Arthritis    generalized   BPH (benign prostatic hyperplasia)    Cancer of skin, squamous cell    frozen    Cataract    bilateral sx   GERD (gastroesophageal reflux disease)    on meds   Hypertension    on meds   Shingles    SIADH (syndrome of inappropriate ADH production) (HCC)    Stroke [facility])     Past Surgical History:  Procedure Laterality Date   CARPAL TUNNEL RELEASE Left 09/09/2022   Procedure: LEFT CARPAL TUNNEL RELEASE;  Surgeon: Betha Loa, MD;  Location:  SURGERY CENTER;  Service: Orthopedics;  Laterality: Left;  Bier block   CATARACT EXTRACTION, BILATERAL     COLONOSCOPY  06/27/2020   COLONOSCOPY  2018   EYE SURGERY     gum graft     HIP ARTHROPLASTY Right 03/13/2023   Procedure: IRRIGATION AND DEBRIDEMENT RIGHT HIP;  Surgeon: Ollen Gross, MD;  Location: [facility];  Service: Orthopedics;  Laterality: Right;   INGUINAL HERNIA REPAIR     as infant   LUMBAR LAMINECTOMY  06/2015   POLYPECTOMY  2018   TA x 3   REVERSE TOTAL SHOULDER ARTHROPLASTY Bilateral    TONSILLECTOMY AND ADENOIDECTOMY     age 60    TOTAL HIP ARTHROPLASTY Right 02/12/2023   Procedure: RIGHT TOTAL HIP ARTHROPLASTY ANTERIOR APPROACH;  Surgeon: Ollen Gross, MD;  Location: [facility];  Service: Orthopedics;  Laterality: Right;   TOTAL KNEE ARTHROPLASTY Bilateral    TOTAL KNEE REVISION Right 12/25/2017   TRANSURETHRAL RESECTION OF PROSTATE     x 2   UPPER GASTROINTESTINAL ENDOSCOPY       A IV Location/Drains/Wounds Patient Lines/Drains/Airways Status     Active Line/Drains/Airways     Name Placement  date Placement time Site Days   Peripheral IV 04/26/23 20 G Posterior;Right Wrist 04/26/23  1347  Wrist  less than 1   PICC Single Lumen 03/17/23 Right Basilic 41 cm 0 cm 03/17/23  4098  Basilic  40   Urethral Catheter patrick NT Double-lumen 14 Fr. 04/26/23  1620  Double-lumen  less than 1            Intake/Output Last 24 hours No intake or output data in the 24 hours ending 04/26/23 1642  Labs/Imaging Results for orders placed or performed during the hospital encounter of 04/26/23 (from the past 48 hour(s))  Comprehensive metabolic panel     Status: Abnormal   Collection Time: 04/26/23 12:30 PM  Result Value Ref Range   Sodium 126 (L) 135 - 145 mmol/L   Potassium 3.7 3.5 -  5.1 mmol/L   Chloride 90 (L) 98 - 111 mmol/L   CO2 22 22 - 32 mmol/L   Glucose, Bld 142 (H) 70 - 99 mg/dL    Comment: Glucose reference range applies only to samples taken after fasting for at least 8 hours.   BUN 13 8 - 23 mg/dL   Creatinine, Ser 1.19 0.61 - 1.24 mg/dL   Calcium 8.6 (L) 8.9 - 10.3 mg/dL   Total Protein 6.9 6.5 - 8.1 g/dL   Albumin 2.9 (L) 3.5 - 5.0 g/dL   AST 28 15 - 41 U/L   ALT 12 0 - 44 U/L   Alkaline Phosphatase 106 38 - 126 U/L   Total Bilirubin 0.8 <1.2 mg/dL   GFR, Estimated >14 >78 mL/min    Comment: (NOTE) Calculated using the CKD-EPI Creatinine Equation (2021)    Anion gap 14 5 - 15    Comment: Performed at [facility], [address] [place], [place] [address]  CBC     Status: Abnormal   Collection Time: 04/26/23 12:30 PM  Result Value Ref Range   WBC 16.2 (H) 4.0 - 10.5 K/uL   RBC 3.66 (L) 4.22 - 5.81 MIL/uL   Hemoglobin 9.8 (L) 13.0 - 17.0 g/dL   HCT 13.0 (L) 86.5 - 78.4 %   MCV 83.3 80.0 - 100.0 fL   MCH 26.8 26.0 - 34.0 pg   MCHC 32.1 30.0 - 36.0 g/dL   RDW 69.6 29.5 - 28.4 %   Platelets 323 150 - 400 K/uL   nRBC 0.0 0.0 - 0.2 %    Comment: Performed at [facility], [address], [place], [place] [address]  Protime-INR     Status: None   Collection Time: 04/26/23 12:30 PM  Result Value Ref Range   Prothrombin Time 14.5 11.4 - 15.2 seconds   INR 1.1 0.8 - 1.2    Comment: (NOTE) INR goal varies based on device and disease states. Performed at [facility], [address] [place], [place] [address]   Urinalysis, Routine w reflex microscopic -Urine, Clean Catch     Status: Abnormal   Collection Time: 04/26/23 12:45 PM  Result Value Ref Range   Color, Urine STRAW (A) YELLOW   APPearance CLEAR CLEAR   Specific Gravity, Urine 1.005 1.005 - 1.030   pH 7.0 5.0 - 8.0   Glucose, UA NEGATIVE NEGATIVE mg/dL   Hgb urine dipstick NEGATIVE NEGATIVE   Bilirubin Urine NEGATIVE NEGATIVE   Ketones, ur NEGATIVE NEGATIVE mg/dL    Protein, ur NEGATIVE NEGATIVE mg/dL   Nitrite NEGATIVE NEGATIVE   Leukocytes,Ua NEGATIVE NEGATIVE  Comment: Performed at [facility], [address], [place], [place] [address]  I-Stat Chem 8, ED     Status: Abnormal   Collection Time: 04/26/23 12:48 PM  Result Value Ref Range   Sodium 126 (L) 135 - 145 mmol/L   Potassium 3.8 3.5 - 5.1 mmol/L   Chloride 90 (L) 98 - 111 mmol/L   BUN 13 8 - 23 mg/dL   Creatinine, Ser 9.93 0.61 - 1.24 mg/dL   Glucose, Bld 716 (H) 70 - 99 mg/dL    Comment: Glucose reference range applies only to samples taken after fasting for at least 8 hours.   Calcium, Ion 1.02 (L) 1.15 - 1.40 mmol/L   TCO2 27 22 - 32 mmol/L   Hemoglobin 10.5 (L) 13.0 - 17.0 g/dL   HCT 96.7 (L) 89.3 - 81.0 %  I-Stat Lactic Acid, ED     Status: None   Collection Time: 04/26/23 12:49 PM  Result Value Ref Range   Lactic Acid, Venous 1.1 0.5 - 1.9 mmol/L   DG CHEST PORT 1 VIEW  Result Date: 04/26/2023 CLINICAL DATA:  Pain after fall EXAM: PORTABLE CHEST 1 VIEW COMPARISON:  X-ray 03/10/2023 and older FINDINGS: Underinflation. Normal cardiopericardial silhouette when adjusting for level of inflation. Calcified aorta no pneumothorax or effusion. Chronic lung changes no consolidation or edema. Overlapping cardiac leads. Bilateral shoulder arthroplasties. IMPRESSION: Underinflation with chronic lung changes. Electronically Signed   By: Karen Kays M.D.   On: 04/26/2023 16:17   DG Pelvis Portable  Result Date: 04/26/2023 CLINICAL DATA:  Pain after fall EXAM: PORTABLE PELVIS 1 VIEWS COMPARISON:  None Available. FINDINGS: Right hip arthroplasty identified with Press-Fit components. The tip of the femoral component is not included in the imaging field. What is seen of the prosthesis is without hardware failure. Chondrocalcinosis along the pubic symphysis. Slight joint space loss of the left hip. Osteopenia. No fracture or dislocation. Degenerative changes of the visualized lumbar spine  at the edge of the imaging field. Overlapping cardiac leads. Hyperostosis. Vascular calcifications. With this level of osteopenia subtle nondisplaced injury is difficult to exclude in the setting of osteopenia and if there is further concern follow up CT is recommended for further delineation IMPRESSION: Osteopenia with degenerative changes. Right hip arthroplasty. Chondrocalcinosis Electronically Signed   By: Karen Kays M.D.   On: 04/26/2023 15:26   CT CERVICAL SPINE WO CONTRAST  Result Date: 04/26/2023 CLINICAL DATA:  Poly trauma. Unwitnessed fall. The patient is on blood thinners. EXAM: CT CERVICAL SPINE WITHOUT CONTRAST TECHNIQUE: Multidetector CT imaging of the cervical spine was performed without intravenous contrast. Multiplanar CT image reconstructions were also generated. RADIATION DOSE REDUCTION: This exam was performed according to the departmental dose-optimization program which includes automated exposure control, adjustment of the mA and/or kV according to patient size and/or use of iterative reconstruction technique. COMPARISON:  None Available. FINDINGS: Alignment: Grade 1 degenerative anterolisthesis present at C4-5. Slight degenerative anterolisthesis is present at C7-T1. No other significant listhesis is present. Straightening of the normal cervical lordosis is present. Skull base and vertebrae: The craniocervical junction is normal. Vertebral body heights are maintained. No acute fractures are present. Degenerative endplate sclerotic changes are present C5-6 and C6-7. Soft tissues and spinal canal: No prevertebral fluid or swelling. No visible canal hematoma. Disc levels: Multilevel degenerative changes are present throughout the cervical spine. Uncovertebral and facet hypertrophy lead to moderate to severe right foraminal stenosis at C4-5. Right greater than left foraminal narrowing is present at C5-6 and  C6-7. Upper chest: The lung apices are not imaged. IMPRESSION: 1. No acute fracture  or traumatic subluxation. 2. Multilevel degenerative changes of the cervical spine as described. Electronically Signed   By: Marin Roberts M.D.   On: 04/26/2023 13:24   CT HEAD WO CONTRAST  Result Date: 04/26/2023 CLINICAL DATA:  Unwitnessed fall. Altered mental status. The patient takes Plavix for a DVT. EXAM: CT HEAD WITHOUT CONTRAST TECHNIQUE: Contiguous axial images were obtained from the base of the skull through the vertex without intravenous contrast. RADIATION DOSE REDUCTION: This exam was performed according to the departmental dose-optimization program which includes automated exposure control, adjustment of the mA and/or kV according to patient size and/or use of iterative reconstruction technique. COMPARISON:  CT head without contrast 09/18/2021 FINDINGS: Brain: Extensive hemorrhagic contusion is present in the anterior inferior frontal lobes, right greater than left. Diffuse subarachnoid hemorrhage present in the right sylvian fissure and right greater than left suprasellar cisterns. Bilateral subdural hematomas are present measuring 5 mm on the left and 4 mm on the right. Diffuse subcortical and periventricular white matter hypoattenuation is similar the prior study. Remote left cerebellar infarcts are again noted. Remote occipital lobe infarcts are stable. Vascular: No hyperdense vessel or unexpected calcification. Skull: A nondisplaced right paramedian frontal skull fracture extends to the coronal suture. Associated right supraorbital scalp hematoma is present. No radiopaque foreign body or pneumocephalus is present. Sinuses/Orbits: The paranasal sinuses and mastoid air cells are clear. Bilateral lens replacements are present. Globes and orbits are otherwise within normal limits. IMPRESSION: 1. Extensive hemorrhagic contusion in the anterior inferior frontal lobes, right greater than left. 2. Diffuse subarachnoid hemorrhage in the right Sylvian fissure and right greater than left  suprasellar cisterns. 3. Bilateral subdural hematomas measuring 5 mm on the left and 4 mm on the right. 4. Nondisplaced right paramedian frontal skull fracture extends to the coronal suture. Associated right supraorbital scalp hematoma is present. 5. Stable appearance of diffuse subcortical and periventricular white matter disease. This likely reflects the sequela of chronic microvascular ischemia. 6. 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 Critical Value/emergent results were called by telephone at the time of interpretation on 04/26/2023 at 1:17 pm to provider ANDREW TEE , who verbally acknowledged these results. Electronically Signed   By: Marin Roberts M.D.   On: 04/26/2023 13:20    Pending Labs Unresulted Labs (From admission, onward)     Start     Ordered   04/27/23 0500  CBC  Daily,   R      04/26/23 1627   04/27/23 0500  Basic metabolic panel  Daily,   R      04/26/23 1627   04/26/23 1627  Hemoglobin A1c  (Glycemic Control (SSI)  Q 4 Hours / Glycemic Control (SSI)  AC +/- HS)  Once,   R       Comments: To assess prior glycemic control    04/26/23 1627   04/26/23 1546  Culture, blood (Routine X 2) w Reflex to ID Panel  BLOOD CULTURE X 2,   R      04/26/23 1545            Vitals/Pain Today's Vitals   04/26/23 1514 04/26/23 1515 04/26/23 1600 04/26/23 1615  BP:  (!) 148/72 (!) 144/78 (!) 167/88  Pulse: (!) 106 (!) 106 (!) 112 (!) 108  Resp: 20 (!) 25 (!) 27 20  Temp:      TempSrc:  SpO2: 100% 100% 100% 100%  Weight:      Height:        Isolation Precautions No active isolations  Medications Medications  docusate sodium (COLACE) capsule 100 mg (has no administration in time range)  polyethylene glycol (MIRALAX / GLYCOLAX) packet 17 g (has no administration in time range)  acetaminophen (TYLENOL) tablet 650 mg (has no administration in time range)  ondansetron (ZOFRAN) injection 4 mg (has no administration in time range)   famotidine (PEPCID) tablet 20 mg (has no administration in time range)  insulin aspart (novoLOG) injection 0-9 Units (has no administration in time range)  clevidipine (CLEVIPREX) infusion 0.5 mg/mL (has no administration in time range)  cefadroxil (DURICEF) capsule 1,000 mg (has no administration in time range)  labetalol (NORMODYNE) injection 10 mg (10 mg Intravenous Given 04/26/23 1347)    Mobility      Focused Assessments    R Recommendations: See Admitting Provider Note  Report given to:   Additional Notes:

## 2023-04-26 NOTE — IPAL (Signed)
  Interdisciplinary Goals of Care Family Meeting   Date carried out: 04/26/2023  Location of the meeting: Conference room  Member's involved: Nurse Practitioner and Family Member or next of kin  Durable Power of Attorney or acting medical decision maker: Daughter, [name]     Discussion: We discussed goals of care for [name] .  Met w pts children [name] and Sherrilyn Rist (his contact is [phone]). We spoke about Bill's current presentation -- SAH, SDH following a fall, as well as his recent frequent falls and complications related to these.   It sounds like he has had a progressive functional decline. Both children voice concerns regarding his decline. Bo feels like his dad's quality of life was "ripped away months ago" and [name] is worried about his worsening status.  We talked about overall goals of care as well as his code status. Bo shared that his dad has spoken to him several times about endorsing DNR/I. [name] doesn't recall if he has had this conversation with her, but in light of his decline she does feel like DNR is appropriate.   We talked about his overall goals of care. At different points both children voiced worry that he is suffering and in pain, Bo also adding that he is at peace with whenever it is "his time" but also not wanting to "rush things"   I suggested taking time to think about what QOL Bill would find acceptable, and giving his current presentation time so we can see if this worsen, stabilize, or improve. They are in agreement with this plan.    Code status:   Code Status: Limited: Do not attempt resuscitation (DNR) -DNR-LIMITED -Do Not Intubate/DNI    Disposition: Continue current acute care  Time spent for the meeting: 41 min    Lanier Clam, NP  04/26/2023, 6:28 PM

## 2023-04-26 NOTE — H&P (Signed)
NAME:  Kenneth Montoya, MRN:  [id number], DOB:  Jan 04, 1941, LOS: 0 ADMISSION DATE:  04/26/2023, CONSULTATION DATE:  11/30 REFERRING MD:  Tee-EDP, CHIEF COMPLAINT:  traumatic SAH   History of Present Illness:  Kenneth Montoya is an 82 y/o gentleman with a history of mild cognitive decline, recent THA in 01/2023, wound infection and likely septic arthritis after a fall causing injury to his incision in Oct 2024 who presented with confusion after a fall this morning. He had an unwitnessed fall around 5:45 this morning at his rehab, and family visiting around 11AM felt that he was not acting normally and noticed the injury on the back of his head. They had him brought to the ED where he was found to have a SAH. He fell about a week ago as well, but no known injuries from that fall. He has previously been on plavix. He had a headache last night per his daughter, but otherwise has not had complaints. He is now more confused than his baseline per daughter. He completed his planned IV course of cefazolin on 11/28 and had his PICC removed on 11/29. He was started on cefadroxil suppressive therapy as planned after completion of his IV course of antibiotics. His family had been working on a safe discharge plan for him this week with some difficulty-- their mother has cognitive impairment and his daughter and the SW do not feel that Kenneth Montoya could safely be at home caring for himself. He has felt pretty strongly that he wants to go home and told his daughter he does not ever want to live in a facility. He has a form indicating he is full code at bedside. His daughter is POA.   Pertinent  Medical History  DVT Stroke SIADH with chronic hyponatrremia HTN GERD BPH Recent MSSA bacteremia, SSI with likely joint involvement (Oct 2024) R THA with recent possible periprosthetic fracture Obesity CAD  Significant Hospital Events: Including procedures, antibiotic start and stop dates in addition to other pertinent events    11/30 admitted  Interim History / Subjective:    Objective   Blood pressure (!) 148/72, pulse (!) 106, temperature 97.7 F (36.5 C), temperature source Oral, resp. rate (!) 25, height 5\' 5"  (1.651 m), weight 90.7 kg, SpO2 100%.       No intake or output data in the 24 hours ending 04/26/23 1542 Filed Weights   04/26/23 1230  Weight: 90.7 kg    Examination: General: chronically ill appearing man lying in bed in NAD HENT: injury with dried blood on the back of his scalp. Lungs: CTAB, breathing comfortably on RA Cardiovascular: S1S2, RRR Abdomen: soft, NT Extremities: healed R anterior hip incision, +BLE edema Neuro: awake, says "thank you" frequently. Oriented to place, able to follow commands briskly. Normal speech. Symmetric strength with lifting arms off the bed, flexion and extension of elbows. Weaker hip flexion with straight leg raise on the R (recent hip surgery), but able to still lift both legs off the bed. Symmetric plantar flexion. No visual field deficits reported. GU: foley with blood tinged urine  Na+ 126 BUN 13 Cr 1.1 WBC 16.2 H/H 9.8/30.5 Platelets 323  UA: <5 WBC, nitrite neg  Ct head personally reviewed: SAH, mostly on the R, some blod anteriorly on the left  Resolved Hospital Problem list     Assessment & Plan:  Traumatic SAH from fall hitting the back of his head Suspect he may have a concussion/ mild TBI as well -appreciate neurosurgery's management;  repeat CT tomorrow morning -admit to ICU for close neurmonitoring -if worsening mental status, repeat head CT STAT -goal SBP ~140; cleviprex PRN -con't PTA amlodipine -hold Pta plavix -PT, OT, SLP  Frequent falls- 2 in the past week -infectious workup- CXR, UA, blood cultures -PT, OT  Recent septic arthritis of prosthetic hip -finished course of cefazolin this week, con't cefadroxil as prescribed -con't hydrocodone and acetaminophen PRN for pain  Chronic hyponatremia due to SIADH -con't  salt tabs -monitor -avoid hypotonic fluids  Hypocalcemia -calcium gluconate  Hyperglycemia -SSI PRN -goal BG 140-180  Anemia Leukocytosis-possibly reactive -transfuse for Hb <7 or hemodynamically significant bleeding -monitor  BPH -con't flomax (pharmacy to check med rec) -hopefully can d/c foley tomorrow; placed in the ED  GERD -con't PTA pepcid & PPI  Leg edema; likely chronic -con't PTA lasix; likely can be increased  History of allergic rhinitis -claritin, flonase  -hold PTA benadryl  Insomnia -melatonin, hold trazodone  I discussed [name] care at bedside with his daughter. She has been struggling with a safe dispo plan for him recently and has not been aligned with her brother in how to best care for their parents. Her dad wants to go home and has made it clear he doesn't want to live in a facility. Both of his children have full time jobs and his wife has cognitive impairment; his daughter does not see a way forward with her father getting the privacy he wants and being able to be cared for in his home. She understands his falls are a symptom of him wanting his autonomy and staff in rehab wanting to respect those wishes. Unfortunately it is hard to prevent all falls when people strive for full independence. We discussed his previous full code status and that I am concerned if he had a cardiopulmonary arrest or was to decline to the point of needing MV for deteriorating mental status, his chances of meaningful recovery are very low. This fall and repeat hospital admission is certainly a setback for him and places his at increased risk for delirium, potentially needing sedation for safety, blood clots, and infections. All of these would worsen his prognosis further. She wants her brother to be involved in discussions as well, and she does not feel that her brother has been able to get through his grief with seeing their father decline so far.  Ongoing GOC discussions are  needed through this hospital stay given his significant decline in the past few months with his expressed wishes to not live in a facility.   Best Practice (right click and "Reselect all SmartList Selections" daily)   Diet/type: NPO DVT prophylaxis: SCDs Pressure ulcer(s): present on admission - sacral stage 1 GI prophylaxis: H2B Lines: N/A Foley:  Yes, and it is still needed Code Status:  full code Last date of multidisciplinary goals of care discussion [11/30 with daughter in ED]  Labs   CBC: Recent Labs  Lab 04/26/23 1230 04/26/23 1248  WBC 16.2*  --   HGB 9.8* 10.5*  HCT 30.5* 31.0*  MCV 83.3  --   PLT 323  --     Basic Metabolic Panel: Recent Labs  Lab 04/26/23 1230 04/26/23 1248  NA 126* 126*  K 3.7 3.8  CL 90* 90*  CO2 22  --   GLUCOSE 142* 146*  BUN 13 13  CREATININE 1.10 0.90  CALCIUM 8.6*  --    GFR: Estimated Creatinine Clearance: 64.4 mL/min (by C-G formula based on SCr of  0.9 mg/dL). Recent Labs  Lab 04/26/23 1230 04/26/23 1249  WBC 16.2*  --   LATICACIDVEN  --  1.1    Liver Function Tests: Recent Labs  Lab 04/26/23 1230  AST 28  ALT 12  ALKPHOS 106  BILITOT 0.8  PROT 6.9  ALBUMIN 2.9*   No results for input(s): "LIPASE", "AMYLASE" in the last 168 hours. No results for input(s): "AMMONIA" in the last 168 hours.  ABG    Component Value Date/Time   TCO2 27 04/26/2023 1248     Coagulation Profile: Recent Labs  Lab 04/26/23 1230  INR 1.1    Cardiac Enzymes: No results for input(s): "CKTOTAL", "CKMB", "CKMBINDEX", "TROPONINI" in the last 168 hours.  HbA1C: Hgb A1c MFr Bld  Date/Time Value Ref Range Status  01/18/2022 08:39 PM 5.4 4.8 - 5.6 % Final    Comment:    (NOTE) Pre diabetes:          5.7%-6.4%  Diabetes:              >6.4%  Glycemic control for   <7.0% adults with diabetes   12/10/2020 05:15 AM 5.8 (H) 4.8 - 5.6 % Final    Comment:    (NOTE) Pre diabetes:          5.7%-6.4%  Diabetes:               >6.4%  Glycemic control for   <7.0% adults with diabetes     CBG: No results for input(s): "GLUCAP" in the last 168 hours.  Review of Systems:   ROS limited due to patient's confusion- he denies all pain.   Past Medical History:  He,  has a past medical history of Anxiety, Arthritis, BPH (benign prostatic hyperplasia), Cancer of skin, squamous cell, Cataract, GERD (gastroesophageal reflux disease), Hypertension, Shingles, SIADH (syndrome of inappropriate ADH production) (HCC), and Stroke (HCC).   Surgical History:   Past Surgical History:  Procedure Laterality Date   CARPAL TUNNEL RELEASE Left 09/09/2022   Procedure: LEFT CARPAL TUNNEL RELEASE;  Surgeon: Betha Loa, MD;  Location: [facility];  Service: Orthopedics;  Laterality: Left;  Bier block   CATARACT EXTRACTION, BILATERAL     COLONOSCOPY  06/27/2020   COLONOSCOPY  2018   EYE SURGERY     gum graft     HIP ARTHROPLASTY Right 03/13/2023   Procedure: IRRIGATION AND DEBRIDEMENT RIGHT HIP;  Surgeon: Ollen Gross, MD;  Location: [facility];  Service: Orthopedics;  Laterality: Right;   INGUINAL HERNIA REPAIR     as infant   LUMBAR LAMINECTOMY  06/2015   POLYPECTOMY  2018   TA x 3   REVERSE TOTAL SHOULDER ARTHROPLASTY Bilateral    TONSILLECTOMY AND ADENOIDECTOMY     age 56    TOTAL HIP ARTHROPLASTY Right 02/12/2023   Procedure: RIGHT TOTAL HIP ARTHROPLASTY ANTERIOR APPROACH;  Surgeon: Ollen Gross, MD;  Location: [facility];  Service: Orthopedics;  Laterality: Right;   TOTAL KNEE ARTHROPLASTY Bilateral    TOTAL KNEE REVISION Right 12/25/2017   TRANSURETHRAL RESECTION OF PROSTATE     x 2   UPPER GASTROINTESTINAL ENDOSCOPY       Social History:   reports that he has never smoked. He has never used smokeless tobacco. He reports that he does not currently use alcohol after a past usage of about 2.0 standard drinks of alcohol per week. He reports that he does not use drugs.   Family History:  His family history  includes Colon polyps (  age of onset: 61) in his mother; Stroke in his maternal grandfather. There is no history of Colon cancer, Esophageal cancer, Rectal cancer, or Stomach cancer.   Allergies Allergies  Allergen Reactions   Percocet [Oxycodone-Acetaminophen] Itching     Home Medications  Prior to Admission medications   Medication Sig Start Date End Date Taking? Authorizing Provider  amLODipine (NORVASC) 10 MG tablet TAKE 1 TABLET BY MOUTH AT  BEDTIME 06/29/22  Yes Etta Grandchild, MD  ascorbic acid (VITAMIN C) 1000 MG tablet Take 1,000 mg by mouth at bedtime.   Yes [provider]  atorvastatin (LIPITOR) 10 MG tablet TAKE 1 TABLET(10 MG) BY MOUTH DAILY Patient taking differently: Take 10 mg by mouth daily. 02/28/23  Yes Etta Grandchild, MD  carvedilol (COREG) 6.25 MG tablet Take 1 tablet (6.25 mg total) by mouth 2 (two) times daily. 12/31/22  Yes Lewayne Bunting, MD  cefadroxil (DURICEF) 500 MG capsule Take 2 capsules (1,000 mg total) by mouth 2 (two) times daily. 04/25/23 06/06/23 Yes Danford, Earl Lites, MD  ceFAZolin (ANCEF) 2-3 GM-%(50ML) SOLR Inject 2 g into the vein every 8 (eight) hours. 04/16/23  Yes [provider]  clopidogrel (PLAVIX) 75 MG tablet TAKE 1 TABLET(75 MG) BY MOUTH DAILY Patient taking differently: Take 75 mg by mouth daily. 02/14/23  Yes Etta Grandchild, MD  cyclobenzaprine (FLEXERIL) 5 MG tablet Take 2 tablets (10 mg total) by mouth 3 (three) times daily as needed for muscle spasms. 02/13/23  Yes Eartha Inch, PA  diphenhydrAMINE (BENADRYL) 25 mg capsule Take 25 mg by mouth daily. For itching related to rash   Yes [provider]  DIPHENHYDRAMINE HCL, TOPICAL, (BENADRYL ITCH STOPPING) 2 % GEL Apply 1 Application topically every 6 (six) hours as needed. To general rash   Yes [provider]  DULoxetine (CYMBALTA) 30 MG capsule Take 1 capsule (30 mg total) by mouth daily. 11/22/22  Yes Jaffe, Adam R, DO  Emollient (EUCERIN EX)  Apply 1 application  topically 2 (two) times daily. Apply a thin layer (2 grams) twice daily to affected/itchy skin   Yes [provider]  famotidine (PEPCID) 40 MG tablet Take 40 mg by mouth daily. 04/14/23  Yes [provider]  ferrous sulfate 324 MG TBEC Take 324 mg by mouth daily. Take with Vitamin C   Yes [provider]  fexofenadine (ALLEGRA) 60 MG tablet Take 60 mg by mouth 2 (two) times daily. Give with water only   Yes [provider]  fluticasone (FLONASE) 50 MCG/ACT nasal spray Place 2 sprays into both nostrils daily. 02/07/22  Yes Etta Grandchild, MD  furosemide (LASIX) 40 MG tablet Take 1 tablet (40 mg total) by mouth daily. 03/18/23 03/17/24 Yes Danford, Earl Lites, MD  heparin flush 10 UNIT/ML injection Inject 5 mLs into the vein every 8 (eight) hours as needed. Flush PICC with 10 ml ns, follow with medication administration, flush with 10 ml ns, follow with 5 ml Heparin. 04/18/23  Yes [provider]  HYDROcodone-acetaminophen (NORCO/VICODIN) 5-325 MG tablet Take 1-2 tablets by mouth every 6 (six) hours as needed for severe pain (pain score 7-10). 03/18/23  Yes Danford, Earl Lites, MD  loratadine (CLARITIN) 10 MG tablet Take 10 mg by mouth daily.   Yes [provider]  Melatonin 10 MG TABS Take by mouth.   Yes [provider]  Menthol, Topical Analgesic, (BIOFREEZE PROFESSIONAL) 5 % GEL Apply 1 Application topically 4 (four) times daily. Shoulder and Neck  stiffness/pain   Yes [provider]  naproxen (NAPROSYN) 250 MG tablet Take 250 mg by mouth 2 (two) times daily. As needed for pain   Yes [provider]  olopatadine (PATANOL) 0.1 % ophthalmic solution Place 1 drop into both eyes at bedtime.   Yes [provider]  psyllium (METAMUCIL) 58.6 % powder Take 1 packet by mouth at bedtime.   Yes [provider]  RABEprazole (ACIPHEX) 20 MG tablet Take 1 tablet (20 mg total) by mouth  daily. 10/23/22  Yes Etta Grandchild, MD  sodium chloride 1 g tablet Take 1 g by mouth 2 (two) times daily with a meal.   Yes [provider]  Sodium Chloride Flush (NORMAL SALINE FLUSH) 0.9 % SOLN Inject 10 mLs into the vein every 8 (eight) hours as needed. 10 ml injection, every 8 hours, flush PICC with 10 ml ns, follow with medication administration, flush with 10 ml ns, follow with 5 ml Heparin. 04/18/23  Yes [provider]  tamsulosin (FLOMAX) 0.4 MG CAPS capsule Take 0.4 mg by mouth at bedtime. 09/17/19  Yes [provider]  traZODone (DESYREL) 50 MG tablet Take 25 mg by mouth at bedtime. 05/08/22  Yes [provider]     Critical care time: 60 min.     Steffanie Dunn, DO 04/26/23 5:04 PM [facility]  For contact information, see Amion. If no response to pager, please call PCCM consult pager. After hours, 7PM- 7AM, please call Elink.

## 2023-04-26 NOTE — ED Notes (Signed)
Trauma Response Nurse Documentation  Kenneth Montoya is a 82 y.o. male arriving to [facility] ED via EMS  On clopidogrel 75 mg daily. Trauma was activated as a Level 2 based on the following trauma criteria Elderly patients > 65 with head trauma on anti-coagulation (excluding ASA).   Patient cleared for CT by PA, MD with a code stroke. Pt transported to CT with trauma response nurse present to monitor. RN remained with the patient throughout their absence from the department for clinical observation. GCS 14.  History   Past Medical History:  Diagnosis Date   Anxiety    Arthritis    generalized   BPH (benign prostatic hyperplasia)    Cancer of skin, squamous cell    frozen    Cataract    bilateral sx   GERD (gastroesophageal reflux disease)    on meds   Hypertension    on meds   Shingles    SIADH (syndrome of inappropriate ADH production) (HCC)    Stroke [facility])      Past Surgical History:  Procedure Laterality Date   CARPAL TUNNEL RELEASE Left 09/09/2022   Procedure: LEFT CARPAL TUNNEL RELEASE;  Surgeon: Betha Loa, MD;  Location: [facility];  Service: Orthopedics;  Laterality: Left;  Bier block   CATARACT EXTRACTION, BILATERAL     COLONOSCOPY  06/27/2020   COLONOSCOPY  2018   EYE SURGERY     gum graft     HIP ARTHROPLASTY Right 03/13/2023   Procedure: IRRIGATION AND DEBRIDEMENT RIGHT HIP;  Surgeon: Ollen Gross, MD;  Location: [facility];  Service: Orthopedics;  Laterality: Right;   INGUINAL HERNIA REPAIR     as infant   LUMBAR LAMINECTOMY  06/2015   POLYPECTOMY  2018   TA x 3   REVERSE TOTAL SHOULDER ARTHROPLASTY Bilateral    TONSILLECTOMY AND ADENOIDECTOMY     age 53    TOTAL HIP ARTHROPLASTY Right 02/12/2023   Procedure: RIGHT TOTAL HIP ARTHROPLASTY ANTERIOR APPROACH;  Surgeon: Ollen Gross, MD;  Location: [facility];  Service: Orthopedics;  Laterality: Right;   TOTAL KNEE ARTHROPLASTY Bilateral    TOTAL KNEE REVISION Right 12/25/2017   TRANSURETHRAL  RESECTION OF PROSTATE     x 2   UPPER GASTROINTESTINAL ENDOSCOPY       Initial Focused Assessment (If applicable, or please see trauma documentation): Patient A&Ox4 - able to answer all orientation questions but confused and perseverating on "thank you" and "yes baby", GCS 14, hematoma to back of head Airway intact, bilateral breath sounds Pulses 2+  CT's Completed:   CT Head and CT C-Spine   Interventions:  IV, labs CT Head/Cspine  Plan for disposition:  Admission to ICU   Consults completed:  Neurosurgeon at see note.  Event Summary: Patient to ED via EMS from [facility] after a fall this morning at 530PM. Fall was unwitnessed however staff heard the fall and were able to assist patient. Per EMS some XRs were completed after the fall but unknown what they were. Imaging was ordered and revealed extensive hemorrhagic contusions, SAH, bilateral SDHs, and frontal skull fracture. NSG was consulted and recommend observation in ICU with follow-up scan tomorrow AM. Possible GOC meeting, per paperwork from [facility] patient is a full code. Daughter en route to hospital.  Bedside handoff with ED RN Chloe.    [name] Mirel Hundal  Trauma Response RN  Please call TRN at [phone] for further assistance.

## 2023-04-26 NOTE — ED Notes (Signed)
Pt's bp in the 160s. ICU MD at bedside made aware.

## 2023-04-26 NOTE — Progress Notes (Signed)
Patient arrived to unit with 45cc  pink blood noted in Foley cath. No urine output noted. Attempted to flush catheter, but patient yelling out in pain. Large bloody clot noted to pass through catheter, still not followed by any urine output. Patient with history of prostate issues, and per ED RN there was difficulty inserting indwelling cath prior to arrival to ICU. Patient on flomax. Foley balloon deflated and patient verbalized instant relief. Unsure if Foley was actually in bladder.  Foley cath removed. External cath placed. Will endorse to night RN to monitor for void.

## 2023-04-26 NOTE — Consult Note (Signed)
Reason for Consult:[facility] Referring Physician: EDP  Kenneth Montoya is an 82 y.o. male.   HPI:  82 year old male who presents to the ED today after sustaining a fall at a nursing home.  He has been treated with IV antibiotics for hip infection status post hip surgery.  The IV antibiotics ended on Thursday per his daughter.  He was then going to start on the oral antibiotic.  He was admitted to the hospital not long ago for acute encephalopathy as well.  He is on Plavix previous strokes.  Patient is a poor historian, laying in bed confused.  Daughter at bedside  Past Medical History:  Diagnosis Date   Anxiety    Arthritis    generalized   BPH (benign prostatic hyperplasia)    Cancer of skin, squamous cell    frozen    Cataract    bilateral sx   GERD (gastroesophageal reflux disease)    on meds   Hypertension    on meds   Shingles    SIADH (syndrome of inappropriate ADH production) (HCC)    Stroke [facility])     Past Surgical History:  Procedure Laterality Date   CARPAL TUNNEL RELEASE Left 09/09/2022   Procedure: LEFT CARPAL TUNNEL RELEASE;  Surgeon: Betha Loa, MD;  Location: [facility];  Service: Orthopedics;  Laterality: Left;  Bier block   CATARACT EXTRACTION, BILATERAL     COLONOSCOPY  06/27/2020   COLONOSCOPY  2018   EYE SURGERY     gum graft     HIP ARTHROPLASTY Right 03/13/2023   Procedure: IRRIGATION AND DEBRIDEMENT RIGHT HIP;  Surgeon: Ollen Gross, MD;  Location: [facility];  Service: Orthopedics;  Laterality: Right;   INGUINAL HERNIA REPAIR     as infant   LUMBAR LAMINECTOMY  06/2015   POLYPECTOMY  2018   TA x 3   REVERSE TOTAL SHOULDER ARTHROPLASTY Bilateral    TONSILLECTOMY AND ADENOIDECTOMY     age 22    TOTAL HIP ARTHROPLASTY Right 02/12/2023   Procedure: RIGHT TOTAL HIP ARTHROPLASTY ANTERIOR APPROACH;  Surgeon: Ollen Gross, MD;  Location: [facility];  Service: Orthopedics;  Laterality: Right;   TOTAL KNEE ARTHROPLASTY Bilateral    TOTAL KNEE  REVISION Right 12/25/2017   TRANSURETHRAL RESECTION OF PROSTATE     x 2   UPPER GASTROINTESTINAL ENDOSCOPY      Allergies  Allergen Reactions   Percocet [Oxycodone-Acetaminophen] Itching    Social History   Tobacco Use   Smoking status: Never   Smokeless tobacco: Never  Substance Use Topics   Alcohol use: Not Currently    Alcohol/week: 2.0 standard drinks of alcohol    Types: 2 Standard drinks or equivalent per week    Comment: wine 1-2 x a week and not every week     Family History  Problem Relation Age of Onset   Colon polyps Mother 91   Stroke Maternal Grandfather    Colon cancer Neg Hx    Esophageal cancer Neg Hx    Rectal cancer Neg Hx    Stomach cancer Neg Hx      Review of Systems  Positive ROS: as above   All other systems have been reviewed and were otherwise negative with the exception of those mentioned in the HPI and as above.  Objective: Vital signs in last 24 hours: Temp:  [97.7 F (36.5 C)] 97.7 F (36.5 C) (11/30 1229) Pulse Rate:  [97-106] 106 (11/30 1515) Resp:  [16-25] 25 (11/30  1515) BP: (102-159)/(70-85) 148/72 (11/30 1515) SpO2:  [93 %-100 %] 100 % (11/30 1515) Weight:  [90.7 kg] 90.7 kg (11/30 1230)  General Appearance: Alert, not cooperative, no distress, appears stated age Head: Normocephalic, without obvious abnormality, atraumatic Eyes: PERRL, conjunctiva/corneas clear, EOM's intact, fundi benign, both eyes      Lungs:  respirations unlabored Heart: Regular rate and rhythm Extremities: Extremities normal, atraumatic, no cyanosis or edema Pulses: 2+ and symmetric all extremities Skin: Skin color, texture, turgor normal, no rashes or lesions  NEUROLOGIC:   Mental status: A& confused, no aphasia, poor good attention span, poor Memory and fund of knowledge Motor Exam - grossly normal, normal tone and bulk Sensory Exam - grossly normal Reflexes: symmetric, no pathologic reflexes, No Hoffman's, No clonus Coordination - grossly  normal Gait - not tested Balance - not tested Cranial Nerves: I: smell Not tested  II: visual acuity  OS: na    OD: na  II: visual fields Full to confrontation  II: pupils Equal, round, reactive to light  III,VII: ptosis None  III,IV,VI: extraocular muscles  Full ROM  V: mastication   V: facial light touch sensation    V,VII: corneal reflex    VII: facial muscle function - upper    VII: facial muscle function - lower   VIII: hearing   IX: soft palate elevation    IX,X: gag reflex   XI: trapezius strength    XI: sternocleidomastoid strength   XI: neck flexion strength    XII: tongue strength      Data Review Lab Results  Component Value Date   WBC 16.2 (H) 04/26/2023   HGB 10.5 (L) 04/26/2023   HCT 31.0 (L) 04/26/2023   MCV 83.3 04/26/2023   PLT 323 04/26/2023   Lab Results  Component Value Date   NA 126 (L) 04/26/2023   K 3.8 04/26/2023   CL 90 (L) 04/26/2023   CO2 22 04/26/2023   BUN 13 04/26/2023   CREATININE 0.90 04/26/2023   GLUCOSE 146 (H) 04/26/2023   Lab Results  Component Value Date   INR 1.1 04/26/2023    Radiology: DG Pelvis Portable  Result Date: 04/26/2023 CLINICAL DATA:  Pain after fall EXAM: PORTABLE PELVIS 1 VIEWS COMPARISON:  None Available. FINDINGS: Right hip arthroplasty identified with Press-Fit components. The tip of the femoral component is not included in the imaging field. What is seen of the prosthesis is without hardware failure. Chondrocalcinosis along the pubic symphysis. Slight joint space loss of the left hip. Osteopenia. No fracture or dislocation. Degenerative changes of the visualized lumbar spine at the edge of the imaging field. Overlapping cardiac leads. Hyperostosis. Vascular calcifications. With this level of osteopenia subtle nondisplaced injury is difficult to exclude in the setting of osteopenia and if there is further concern follow up CT is recommended for further delineation IMPRESSION: Osteopenia with degenerative  changes. Right hip arthroplasty. Chondrocalcinosis Electronically Signed   By: Karen Kays M.D.   On: 04/26/2023 15:26   CT CERVICAL SPINE WO CONTRAST  Result Date: 04/26/2023 CLINICAL DATA:  Poly trauma. Unwitnessed fall. The patient is on blood thinners. EXAM: CT CERVICAL SPINE WITHOUT CONTRAST TECHNIQUE: Multidetector CT imaging of the cervical spine was performed without intravenous contrast. Multiplanar CT image reconstructions were also generated. RADIATION DOSE REDUCTION: This exam was performed according to the departmental dose-optimization program which includes automated exposure control, adjustment of the mA and/or kV according to patient size and/or use of iterative reconstruction technique. COMPARISON:  None Available. FINDINGS:  Alignment: Grade 1 degenerative anterolisthesis present at C4-5. Slight degenerative anterolisthesis is present at C7-T1. No other significant listhesis is present. Straightening of the normal cervical lordosis is present. Skull base and vertebrae: The craniocervical junction is normal. Vertebral body heights are maintained. No acute fractures are present. Degenerative endplate sclerotic changes are present C5-6 and C6-7. Soft tissues and spinal canal: No prevertebral fluid or swelling. No visible canal hematoma. Disc levels: Multilevel degenerative changes are present throughout the cervical spine. Uncovertebral and facet hypertrophy lead to moderate to severe right foraminal stenosis at C4-5. Right greater than left foraminal narrowing is present at C5-6 and C6-7. Upper chest: The lung apices are not imaged. IMPRESSION: 1. No acute fracture or traumatic subluxation. 2. Multilevel degenerative changes of the cervical spine as described. Electronically Signed   By: Marin Roberts M.D.   On: 04/26/2023 13:24   CT HEAD WO CONTRAST  Result Date: 04/26/2023 CLINICAL DATA:  Unwitnessed fall. Altered mental status. The patient takes Plavix for a DVT. EXAM: CT HEAD  WITHOUT CONTRAST TECHNIQUE: Contiguous axial images were obtained from the base of the skull through the vertex without intravenous contrast. RADIATION DOSE REDUCTION: This exam was performed according to the departmental dose-optimization program which includes automated exposure control, adjustment of the mA and/or kV according to patient size and/or use of iterative reconstruction technique. COMPARISON:  CT head without contrast 09/18/2021 FINDINGS: Brain: Extensive hemorrhagic contusion is present in the anterior inferior frontal lobes, right greater than left. Diffuse subarachnoid hemorrhage present in the right sylvian fissure and right greater than left suprasellar cisterns. Bilateral subdural hematomas are present measuring 5 mm on the left and 4 mm on the right. Diffuse subcortical and periventricular white matter hypoattenuation is similar the prior study. Remote left cerebellar infarcts are again noted. Remote occipital lobe infarcts are stable. Vascular: No hyperdense vessel or unexpected calcification. Skull: A nondisplaced right paramedian frontal skull fracture extends to the coronal suture. Associated right supraorbital scalp hematoma is present. No radiopaque foreign body or pneumocephalus is present. Sinuses/Orbits: The paranasal sinuses and mastoid air cells are clear. Bilateral lens replacements are present. Globes and orbits are otherwise within normal limits. IMPRESSION: 1. Extensive hemorrhagic contusion in the anterior inferior frontal lobes, right greater than left. 2. Diffuse subarachnoid hemorrhage in the right Sylvian fissure and right greater than left suprasellar cisterns. 3. Bilateral subdural hematomas measuring 5 mm on the left and 4 mm on the right. 4. Nondisplaced right paramedian frontal skull fracture extends to the coronal suture. Associated right supraorbital scalp hematoma is present. 5. Stable appearance of diffuse subcortical and periventricular white matter disease. This  likely reflects the sequela of chronic microvascular ischemia. 6. 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 Critical Value/emergent results were called by telephone at the time of interpretation on 04/26/2023 at 1:17 pm to provider ANDREW TEE , who verbally acknowledged these results. Electronically Signed   By: Marin Roberts M.D.   On: 04/26/2023 13:20    Assessment/Plan: 82 year old comes in today after sustaining a fall at the nursing home.  CT head shows extensive hemorrhagic contusions in the frontal lobes right greater than left.  Diffuse subarachnoid hemorrhage in the right sylvian fissure and suprasellar cisterns.  Bilateral subdural hematomas 5 mm on the right and 4 mm on the left.  Nondisplaced right paramedian frontal skull fracture.  No surgical intervention warranted right now.  Given his extensive medical history he will be admitted to the ICU for close monitoring.  Suspect  his CT scan will look worse in the morning.  Discussed having a goals of care meeting with the daughter should the patient decline neurologically.   Tiana Loft Abbeygail Igoe 04/26/2023 3:31 PM

## 2023-04-26 NOTE — ED Provider Notes (Signed)
[place] EMERGENCY DEPARTMENT AT [facility] Provider Note   CSN: [id number] Arrival date & time: 04/26/23  1218     History  Chief Complaint  Patient presents with   level 2 fall on thinners     Kenneth Montoya is a 82 y.o. male with past medical history significant for arthritis, GERD, hypertension, stroke, SIADH brought to the ED by EMS from [facility] due to an unwitnessed fall around 5:45 AM.  Staff report hearing the patient fall.  Patient's baseline is A&Ox4.  Family noticed that patient was not his usual self around 11 AM.  Patient was not awake or talking as he typically does.  He was able to be roused by EMS who took him for x-rays and therapy after the incident.  EMS found a hematoma to the back of his head.  He has been talkative with EMS and saying "thank you" repetitively, which is not his usual behavior.  Patient is on Plavix for previous strokes.  He is not currently complaining of any pain.  Patient does not remember falling or what happened this morning.       Home Medications Prior to Admission medications   Medication Sig Start Date End Date Taking? Authorizing Provider  amLODipine (NORVASC) 10 MG tablet TAKE 1 TABLET BY MOUTH AT  BEDTIME 06/29/22  Yes Etta Grandchild, MD  ascorbic acid (VITAMIN C) 1000 MG tablet Take 1,000 mg by mouth at bedtime.   Yes [provider]  atorvastatin (LIPITOR) 10 MG tablet TAKE 1 TABLET(10 MG) BY MOUTH DAILY Patient taking differently: Take 10 mg by mouth daily. 02/28/23  Yes Etta Grandchild, MD  carvedilol (COREG) 6.25 MG tablet Take 1 tablet (6.25 mg total) by mouth 2 (two) times daily. 12/31/22  Yes Lewayne Bunting, MD  cefadroxil (DURICEF) 500 MG capsule Take 2 capsules (1,000 mg total) by mouth 2 (two) times daily. 04/25/23 06/06/23 Yes Danford, Earl Lites, MD  ceFAZolin (ANCEF) 2-3 GM-%(50ML) SOLR Inject 2 g into the vein every 8 (eight) hours. 04/16/23  Yes [provider]  clopidogrel (PLAVIX)  75 MG tablet TAKE 1 TABLET(75 MG) BY MOUTH DAILY Patient taking differently: Take 75 mg by mouth daily. 02/14/23  Yes Etta Grandchild, MD  cyclobenzaprine (FLEXERIL) 5 MG tablet Take 2 tablets (10 mg total) by mouth 3 (three) times daily as needed for muscle spasms. 02/13/23  Yes Eartha Inch, PA  diphenhydrAMINE (BENADRYL) 25 mg capsule Take 25 mg by mouth daily. For itching related to rash   Yes [provider]  DIPHENHYDRAMINE HCL, TOPICAL, (BENADRYL ITCH STOPPING) 2 % GEL Apply 1 Application topically every 6 (six) hours as needed. To general rash   Yes [provider]  DULoxetine (CYMBALTA) 30 MG capsule Take 1 capsule (30 mg total) by mouth daily. 11/22/22  Yes Jaffe, Adam R, DO  Emollient (EUCERIN EX) Apply 1 application  topically 2 (two) times daily. Apply a thin layer (2 grams) twice daily to affected/itchy skin   Yes [provider]  famotidine (PEPCID) 40 MG tablet Take 40 mg by mouth daily. 04/14/23  Yes [provider]  ferrous sulfate 324 MG TBEC Take 324 mg by mouth daily. Take with Vitamin C   Yes [provider]  fexofenadine (ALLEGRA) 60 MG tablet Take 60 mg by mouth 2 (two) times daily. Give with water only   Yes [provider]  fluticasone (FLONASE) 50 MCG/ACT nasal spray Place 2 sprays into both nostrils daily.  02/07/22  Yes Etta Grandchild, MD  furosemide (LASIX) 40 MG tablet Take 1 tablet (40 mg total) by mouth daily. 03/18/23 03/17/24 Yes Danford, Earl Lites, MD  heparin flush 10 UNIT/ML injection Inject 5 mLs into the vein every 8 (eight) hours as needed. Flush PICC with 10 ml ns, follow with medication administration, flush with 10 ml ns, follow with 5 ml Heparin. 04/18/23  Yes [provider]  HYDROcodone-acetaminophen (NORCO/VICODIN) 5-325 MG tablet Take 1-2 tablets by mouth every 6 (six) hours as needed for severe pain (pain score 7-10). 03/18/23  Yes Danford, Earl Lites, MD  loratadine (CLARITIN) 10 MG  tablet Take 10 mg by mouth daily.   Yes [provider]  Melatonin 10 MG TABS Take by mouth.   Yes [provider]  Menthol, Topical Analgesic, (BIOFREEZE PROFESSIONAL) 5 % GEL Apply 1 Application topically 4 (four) times daily. Shoulder and Neck stiffness/pain   Yes [provider]  naproxen (NAPROSYN) 250 MG tablet Take 250 mg by mouth 2 (two) times daily. As needed for pain   Yes [provider]  olopatadine (PATANOL) 0.1 % ophthalmic solution Place 1 drop into both eyes at bedtime.   Yes [provider]  psyllium (METAMUCIL) 58.6 % powder Take 1 packet by mouth at bedtime.   Yes [provider]  RABEprazole (ACIPHEX) 20 MG tablet Take 1 tablet (20 mg total) by mouth daily. 10/23/22  Yes Etta Grandchild, MD  sodium chloride 1 g tablet Take 1 g by mouth 2 (two) times daily with a meal.   Yes [provider]  Sodium Chloride Flush (NORMAL SALINE FLUSH) 0.9 % SOLN Inject 10 mLs into the vein every 8 (eight) hours as needed. 10 ml injection, every 8 hours, flush PICC with 10 ml ns, follow with medication administration, flush with 10 ml ns, follow with 5 ml Heparin. 04/18/23  Yes [provider]  tamsulosin (FLOMAX) 0.4 MG CAPS capsule Take 0.4 mg by mouth at bedtime. 09/17/19  Yes [provider]  traZODone (DESYREL) 50 MG tablet Take 25 mg by mouth at bedtime. 05/08/22  Yes [provider]      Allergies    Percocet [oxycodone-acetaminophen]    Review of Systems   Review of Systems  Unable to perform ROS: Mental status change    Physical Exam Updated Vital Signs BP (!) 148/72   Pulse (!) 106   Temp 97.7 F (36.5 C) (Oral)   Resp (!) 25   Ht 5\' 5"  (1.651 m)   Wt 90.7 kg   SpO2 100%   BMI 33.28 kg/m  Physical Exam Vitals and nursing note reviewed.  Constitutional:      General: He is not in acute distress.    Appearance: Normal appearance. He is not ill-appearing or diaphoretic.  HENT:      Head: Normocephalic. Abrasion, contusion and mass present.     Comments: Hematoma to the occipital region with minimal blood likely from abrasion.  No obvious active bleeding. Eyes:     General: Lids are normal. Vision grossly intact.     Extraocular Movements: Extraocular movements intact.     Pupils: Pupils are equal, round, and reactive to light.  Cardiovascular:     Rate and Rhythm: Regular rhythm. Tachycardia present.     Pulses: Normal pulses.  Pulmonary:     Effort: Pulmonary effort is normal. No tachypnea or respiratory distress.     Breath sounds: Normal breath sounds and air entry.  Musculoskeletal:  Cervical back: Full passive range of motion without pain. No pain with movement, spinous process tenderness or muscular tenderness.     Comments: Healing surgical scar over right hip.  There is an area of ecchymosis to posterior hip/buttock on the right side, no ecchymosis on the left.  No other gross deformities or obvious injuries.  No erythema or increased warmth over the right hip or right thigh.    Skin:    General: Skin is warm and dry.     Capillary Refill: Capillary refill takes less than 2 seconds.  Neurological:     Mental Status: He is alert and oriented to person, place, and time.     GCS: GCS eye subscore is 4. GCS verbal subscore is 4. GCS motor subscore is 6.     Cranial Nerves: No dysarthria or facial asymmetry.     Sensory: No sensory deficit.     Motor: No weakness.     Comments: Patient is moving extremities appropriately; no unilateral weakness.  Patient is oriented to self, time, place but not situation.  He does keep repeating "thank you" and asking his daughter to kiss his head.  He appears confused but does follow simple commands. Gait deferred.   Psychiatric:        Mood and Affect: Mood normal.        Behavior: Behavior normal.     ED Results / Procedures / Treatments   Labs (all labs ordered are listed, but only abnormal results are  displayed) Labs Reviewed  COMPREHENSIVE METABOLIC PANEL - Abnormal; Notable for the following components:      Result Value   Sodium 126 (*)    Chloride 90 (*)    Glucose, Bld 142 (*)    Calcium 8.6 (*)    Albumin 2.9 (*)    All other components within normal limits  CBC - Abnormal; Notable for the following components:   WBC 16.2 (*)    RBC 3.66 (*)    Hemoglobin 9.8 (*)    HCT 30.5 (*)    All other components within normal limits  URINALYSIS, ROUTINE W REFLEX MICROSCOPIC - Abnormal; Notable for the following components:   Color, Urine STRAW (*)    All other components within normal limits  I-STAT CHEM 8, ED - Abnormal; Notable for the following components:   Sodium 126 (*)    Chloride 90 (*)    Glucose, Bld 146 (*)    Calcium, Ion 1.02 (*)    Hemoglobin 10.5 (*)    HCT 31.0 (*)    All other components within normal limits  CULTURE, BLOOD (ROUTINE X 2)  CULTURE, BLOOD (ROUTINE X 2)  PROTIME-INR  I-STAT CG4 LACTIC ACID, ED    EKG None  Radiology DG Pelvis Portable  Result Date: 04/26/2023 CLINICAL DATA:  Pain after fall EXAM: PORTABLE PELVIS 1 VIEWS COMPARISON:  None Available. FINDINGS: Right hip arthroplasty identified with Press-Fit components. The tip of the femoral component is not included in the imaging field. What is seen of the prosthesis is without hardware failure. Chondrocalcinosis along the pubic symphysis. Slight joint space loss of the left hip. Osteopenia. No fracture or dislocation. Degenerative changes of the visualized lumbar spine at the edge of the imaging field. Overlapping cardiac leads. Hyperostosis. Vascular calcifications. With this level of osteopenia subtle nondisplaced injury is difficult to exclude in the setting of osteopenia and if there is further concern follow up CT is recommended for further delineation IMPRESSION: Osteopenia with degenerative changes.  Right hip arthroplasty. Chondrocalcinosis Electronically Signed   By: Karen Kays M.D.    On: 04/26/2023 15:26   CT CERVICAL SPINE WO CONTRAST  Result Date: 04/26/2023 CLINICAL DATA:  Poly trauma. Unwitnessed fall. The patient is on blood thinners. EXAM: CT CERVICAL SPINE WITHOUT CONTRAST TECHNIQUE: Multidetector CT imaging of the cervical spine was performed without intravenous contrast. Multiplanar CT image reconstructions were also generated. RADIATION DOSE REDUCTION: This exam was performed according to the departmental dose-optimization program which includes automated exposure control, adjustment of the mA and/or kV according to patient size and/or use of iterative reconstruction technique. COMPARISON:  None Available. FINDINGS: Alignment: Grade 1 degenerative anterolisthesis present at C4-5. Slight degenerative anterolisthesis is present at C7-T1. No other significant listhesis is present. Straightening of the normal cervical lordosis is present. Skull base and vertebrae: The craniocervical junction is normal. Vertebral body heights are maintained. No acute fractures are present. Degenerative endplate sclerotic changes are present C5-6 and C6-7. Soft tissues and spinal canal: No prevertebral fluid or swelling. No visible canal hematoma. Disc levels: Multilevel degenerative changes are present throughout the cervical spine. Uncovertebral and facet hypertrophy lead to moderate to severe right foraminal stenosis at C4-5. Right greater than left foraminal narrowing is present at C5-6 and C6-7. Upper chest: The lung apices are not imaged. IMPRESSION: 1. No acute fracture or traumatic subluxation. 2. Multilevel degenerative changes of the cervical spine as described. Electronically Signed   By: Marin Roberts M.D.   On: 04/26/2023 13:24   CT HEAD WO CONTRAST  Result Date: 04/26/2023 CLINICAL DATA:  Unwitnessed fall. Altered mental status. The patient takes Plavix for a DVT. EXAM: CT HEAD WITHOUT CONTRAST TECHNIQUE: Contiguous axial images were obtained from the base of the skull through  the vertex without intravenous contrast. RADIATION DOSE REDUCTION: This exam was performed according to the departmental dose-optimization program which includes automated exposure control, adjustment of the mA and/or kV according to patient size and/or use of iterative reconstruction technique. COMPARISON:  CT head without contrast 09/18/2021 FINDINGS: Brain: Extensive hemorrhagic contusion is present in the anterior inferior frontal lobes, right greater than left. Diffuse subarachnoid hemorrhage present in the right sylvian fissure and right greater than left suprasellar cisterns. Bilateral subdural hematomas are present measuring 5 mm on the left and 4 mm on the right. Diffuse subcortical and periventricular white matter hypoattenuation is similar the prior study. Remote left cerebellar infarcts are again noted. Remote occipital lobe infarcts are stable. Vascular: No hyperdense vessel or unexpected calcification. Skull: A nondisplaced right paramedian frontal skull fracture extends to the coronal suture. Associated right supraorbital scalp hematoma is present. No radiopaque foreign body or pneumocephalus is present. Sinuses/Orbits: The paranasal sinuses and mastoid air cells are clear. Bilateral lens replacements are present. Globes and orbits are otherwise within normal limits. IMPRESSION: 1. Extensive hemorrhagic contusion in the anterior inferior frontal lobes, right greater than left. 2. Diffuse subarachnoid hemorrhage in the right Sylvian fissure and right greater than left suprasellar cisterns. 3. Bilateral subdural hematomas measuring 5 mm on the left and 4 mm on the right. 4. Nondisplaced right paramedian frontal skull fracture extends to the coronal suture. Associated right supraorbital scalp hematoma is present. 5. Stable appearance of diffuse subcortical and periventricular white matter disease. This likely reflects the sequela of chronic microvascular ischemia. 6. Remote left cerebellar and bilateral  occipital lobe infarcts are stable. Critical Value/emergent results were called by telephone at the time of interpretation on 04/26/2023 at 1:17 pm to provider ANDREW TEE , who  verbally acknowledged these results. Electronically Signed   By: Marin Roberts M.D.   On: 04/26/2023 13:20    Procedures .Critical Care  Performed by: Lenard Simmer, PA-C Authorized by: Lenard Simmer, PA-C   Critical care provider statement:    Critical care time (minutes):  42   Critical care time was exclusive of:  Teaching time and separately billable procedures and treating other patients   Critical care was necessary to treat or prevent imminent or life-threatening deterioration of the following conditions:  CNS failure or compromise   Critical care was time spent personally by me on the following activities:  Development of treatment plan with patient or surrogate, discussions with consultants, evaluation of patient's response to treatment, examination of patient, obtaining history from patient or surrogate, ordering and performing treatments and interventions, ordering and review of laboratory studies, ordering and review of radiographic studies, pulse oximetry, re-evaluation of patient's condition and review of old charts   I assumed direction of critical care for this patient from another provider in my specialty: no     Care discussed with: admitting provider       Medications Ordered in ED Medications  labetalol (NORMODYNE) injection 10 mg (10 mg Intravenous Given 04/26/23 1347)    ED Course/ Medical Decision Making/ A&P                                 Medical Decision Making Amount and/or Complexity of Data Reviewed Labs: ordered. Radiology: ordered.   This patient presents to the ED with chief complaint(s) of unwitnessed fall with pertinent past medical history of on Plavix, CVA, SIADH.  The complaint involves an extensive differential diagnosis and also carries with it a high risk of  complications and morbidity.    The differential diagnosis includes acute intracranial injury, skull fracture, infectious process, concussion   The initial plan is to obtain imaging, labs, urine  Additional history obtained: Additional history obtained from EMS  and patient's daughter.  Patient is relatively poor historian and is confused.  Daughter states patient was on IV antibiotics for treatment of a hip infection s/p hip surgery.  IV abx were completed on Thursday and he was to transition to oral antibiotics.    Initial Assessment:   Exam significant for hematoma to the occipital region of the head with abrasion and minimal bleeding.  Area is not tender to palpation.  No cervical spinal tenderness on palpation.  Normal ROM without pain.  No obvious step offs or gross deformities to cervical spine.  PERRL.  Patient is alert and oriented.  He is oriented to self, place, and time but not situation.  He appears confused.  He does keep repeating "thank you".  When turning patient, he is found to have ecchymosis to his right posterior hip/buttock region, but no ecchymosis on the left.  No obvious gross deformities or other injuries on exam.  Independent ECG/labs interpretation:  The following labs were independently interpreted:  CBC with leukocytosis, may be reactive to trauma or related to recent hip replacement infection.  There is also anemia, appears to be chronic.   Metabolic panel with hyponatremia, chronic for patient.  No other major electrolyte disturbance.  Renal and hepatic function are normal.   UA without evidence of infection.  Lactic 1.1.   Independent visualization and interpretation of imaging: I independently visualized the following imaging with scope of interpretation limited to determining acute life  threatening conditions related to emergency care: CT head, which revealed extensive hemorrhagic contusion in anterior inferior frontal lobes, right greater than left.  There is  also diffuse subarachnoid hemorrhage in right Sylvian fissure and right greater than left suprasellar cisterns, bilateral subdural hematomas, and nondisplaced right paramedian frontal skull fracture extending into coronal suture.    Pelvis x-ray was obtained.  No acute abnormality, there is significant osteopenia and right hip arthroplasty.    Treatment and Reassessment: Patient given labetalol for blood pressure management in the setting of acute intracranial injury.   Consultations obtained:   Spoke with on-call neurosurgery provider, Leo Grosser, NP who agreed to come and see patient.  Neurosurgery requesting that patient be admitted by CCM as he is not a surgical candidate and could rapidly decline as he has other health problems.    I requested consultation with critical care/intensivist and spoke with Dr. Karie Fetch who agreed to come see patient.    Disposition:   Patient to be admitted to [facility].           Final Clinical Impression(s) / ED Diagnoses Final diagnoses:  Traumatic intracerebral hemorrhage with unknown loss of consciousness status, unspecified laterality, initial encounter [facility])  Disorientation  Chronic hyponatremia    Rx / DC Orders ED Discharge Orders     None         Lenard Simmer, PA-C 04/26/23 1601    Durwin Glaze, MD 04/26/23 2050

## 2023-04-26 NOTE — ED Triage Notes (Signed)
Pt BIB [facility] from [facility] d/t an unwitnessed fall around 5;45 this morning. The pt heard the pt fell. Pt's baseline is A&O X4, family noticed he is not acting like his normal self around 11 am. Pt was taken to Xray and therapy after the incident.  A hematoma noticed on the back of his head.  Pt is on Plavix for dvt.   168/82 Hr 90  Cbg 203  O2 95%  Rr 18

## 2023-04-27 ENCOUNTER — Inpatient Hospital Stay (HOSPITAL_COMMUNITY): Payer: Medicare Other

## 2023-04-27 DIAGNOSIS — R339 Retention of urine, unspecified: Secondary | ICD-10-CM

## 2023-04-27 DIAGNOSIS — I609 Nontraumatic subarachnoid hemorrhage, unspecified: Secondary | ICD-10-CM | POA: Diagnosis not present

## 2023-04-27 DIAGNOSIS — I10 Essential (primary) hypertension: Secondary | ICD-10-CM | POA: Diagnosis not present

## 2023-04-27 LAB — GLUCOSE, CAPILLARY
Glucose-Capillary: 140 mg/dL — ABNORMAL HIGH (ref 70–99)
Glucose-Capillary: 108 mg/dL — ABNORMAL HIGH (ref 70–99)
Glucose-Capillary: 131 mg/dL — ABNORMAL HIGH (ref 70–99)
Glucose-Capillary: 115 mg/dL — ABNORMAL HIGH (ref 70–99)
Glucose-Capillary: 135 mg/dL — ABNORMAL HIGH (ref 70–99)
Glucose-Capillary: 135 mg/dL — ABNORMAL HIGH (ref 70–99)

## 2023-04-27 LAB — CBC
HCT: 25.9 % — ABNORMAL LOW (ref 39.0–52.0)
Hemoglobin: 8.7 g/dL — ABNORMAL LOW (ref 13.0–17.0)
MCH: 27.4 pg (ref 26.0–34.0)
MCHC: 33.6 g/dL (ref 30.0–36.0)
MCV: 81.7 fL (ref 80.0–100.0)
Platelets: 304 10*3/uL (ref 150–400)
RBC: 3.17 MIL/uL — ABNORMAL LOW (ref 4.22–5.81)
RDW: 15.5 % (ref 11.5–15.5)
WBC: 16 10*3/uL — ABNORMAL HIGH (ref 4.0–10.5)
nRBC: 0 % (ref 0.0–0.2)

## 2023-04-27 LAB — BASIC METABOLIC PANEL
Anion gap: 14 (ref 5–15)
BUN: 13 mg/dL (ref 8–23)
CO2: 25 mmol/L (ref 22–32)
Calcium: 8.9 mg/dL (ref 8.9–10.3)
Chloride: 90 mmol/L — ABNORMAL LOW (ref 98–111)
Creatinine, Ser: 1.12 mg/dL (ref 0.61–1.24)
GFR, Estimated: >60 mL/min (ref >60)
Glucose, Bld: 119 mg/dL — ABNORMAL HIGH (ref 70–99)
Potassium: 3.6 mmol/L (ref 3.5–5.1)
Sodium: 129 mmol/L — ABNORMAL LOW (ref 135–145)

## 2023-04-27 MED ORDER — FUROSEMIDE 10 MG/ML IJ SOLN
40.0000 mg | Freq: Once | INTRAMUSCULAR | Status: AC
  Administered 2023-04-27: 40 mg via INTRAVENOUS
  Filled 2023-04-27: qty 4

## 2023-04-27 MED ORDER — IPRATROPIUM-ALBUTEROL 0.5-2.5 (3) MG/3ML IN SOLN
3.0000 mL | Freq: Four times a day (QID) | RESPIRATORY_TRACT | Status: DC | PRN
  Administered 2023-04-27: 3 mL via RESPIRATORY_TRACT
  Filled 2023-04-27: qty 3

## 2023-04-27 MED ORDER — LIDOCAINE HCL URETHRAL/MUCOSAL 2 % EX GEL: 1.0000 | Freq: Once | CUTANEOUS | Status: DC

## 2023-04-27 MED ORDER — CARVEDILOL 12.5 MG PO TABS
12.5000 mg | ORAL_TABLET | Freq: Two times a day (BID) | ORAL | Status: DC
  Administered 2023-04-27: 12.5 mg via ORAL
  Filled 2023-04-27: qty 1

## 2023-04-27 MED ORDER — LABETALOL HCL 5 MG/ML IV SOLN
20.0000 mg | INTRAVENOUS | Status: DC | PRN
  Administered 2023-04-28 – 2023-04-29 (×3): 20 mg via INTRAVENOUS
  Filled 2023-04-27 (×3): qty 4

## 2023-04-27 MED ORDER — FAMOTIDINE IN NACL 20-0.9 MG/50ML-% IV SOLN
20.0000 mg | Freq: Two times a day (BID) | INTRAVENOUS | Status: DC
  Administered 2023-04-27: 20 mg via INTRAVENOUS
  Filled 2023-04-27: qty 50

## 2023-04-27 MED ORDER — PANTOPRAZOLE SODIUM 40 MG IV SOLR: 40.0000 mg | Freq: Every day | INTRAVENOUS | Status: DC

## 2023-04-27 MED ORDER — LABETALOL HCL 5 MG/ML IV SOLN
10.0000 mg | INTRAVENOUS | Status: DC | PRN
  Administered 2023-04-27 (×2): 10 mg via INTRAVENOUS
  Filled 2023-04-27 (×2): qty 4

## 2023-04-27 MED ORDER — ACETAMINOPHEN 650 MG RE SUPP
650.0000 mg | RECTAL | Status: DC | PRN
  Administered 2023-04-28: 650 mg via RECTAL
  Filled 2023-04-27: qty 1

## 2023-04-27 NOTE — Progress Notes (Signed)
CCM MD made aware that patient's bladder scan was . New order placed for coude catheter with the use of lidocaine. This RN, Cora Collum, and Dennard Nip were at the bedside for placement. The coude catheter was inserted all the way as far as it could go in and no urine came into the catheter. Patient was also in a lot of pain. Coude catheter taken out and the balloon was not inflated. CCM MD made aware of attempt.

## 2023-04-27 NOTE — Plan of Care (Signed)
  Problem: Clinical Measurements: Goal: Respiratory complications will improve Outcome: Not Progressing    Patient sitting up in chair. Significant audible wheezing noted. Spo2 dipped into mid 70's. Patient placed on 3L [place] with immediate improvement in O2 sat to 95%. Upon full assessment, patient also with low grade Temp 100.9, and 2+pitting edema to bilat lower extremities. Legs elevated in chair, but patient frequently adjusting leg rest in recliner. PO lasix given earlier this morning as ordered.  CCM made aware of above. Additional Lasix 40 ordered via IV, PRN neb, and CXR ordered.

## 2023-04-27 NOTE — Progress Notes (Signed)
CT head reviewed, some areas of hemorrhage have blossomed out a little compared to previous scan but still no surgical intervention that we can offer. Would recommend goals of care discussion with family. I did have a conversation with the daughter on admission and discussed the liklihood of him declining over the next couple days with the swelling that will happen. She was understanding. Please call me with questions or concerns as I am primary call for neurosurgery.

## 2023-04-27 NOTE — Progress Notes (Signed)
eLink Physician-Brief Progress Note Patient Name: Kenneth Montoya DOB: 1940-12-30 MRN: [id number]   Date of Service  04/27/2023  HPI/Events of Note  Received query from RN regarding lethargy and oral meds.  The patient was noted to have increased lethargy today, only arousing to sternal rub.  CT head was done and reviewed by neurosurgery and had no surgical intervention recommended.    eICU Interventions  Hold PO meds.  Pt has IV labetalol for BP control.  Famotidine and protonix changed to IV.      Intervention Category Intermediate Interventions: Other:  Larinda Buttery 04/27/2023, 9:26 PM

## 2023-04-27 NOTE — Evaluation (Signed)
Clinical/Bedside Swallow Evaluation Patient Details  Name: Kenneth Montoya MRN: [id number] Date of Birth: January 13, 1941  Today's Date: 04/27/2023 Time: SLP Start Time (ACUTE ONLY): 1059 SLP Stop Time (ACUTE ONLY): 1121 SLP Time Calculation (min) (ACUTE ONLY): 22 min  Past Medical History:  Past Medical History:  Diagnosis Date   Anxiety    Arthritis    generalized   BPH (benign prostatic hyperplasia)    Cancer of skin, squamous cell    frozen    Cataract    bilateral sx   GERD (gastroesophageal reflux disease)    on meds   Hypertension    on meds   Shingles    SIADH (syndrome of inappropriate ADH production) (HCC)    Stroke [facility])    Past Surgical History:  Past Surgical History:  Procedure Laterality Date   CARPAL TUNNEL RELEASE Left 09/09/2022   Procedure: LEFT CARPAL TUNNEL RELEASE;  Surgeon: Betha Loa, MD;  Location: [facility];  Service: Orthopedics;  Laterality: Left;  Bier block   CATARACT EXTRACTION, BILATERAL     COLONOSCOPY  06/27/2020   COLONOSCOPY  2018   EYE SURGERY     gum graft     HIP ARTHROPLASTY Right 03/13/2023   Procedure: IRRIGATION AND DEBRIDEMENT RIGHT HIP;  Surgeon: Ollen Gross, MD;  Location: [facility];  Service: Orthopedics;  Laterality: Right;   INGUINAL HERNIA REPAIR     as infant   LUMBAR LAMINECTOMY  06/2015   POLYPECTOMY  2018   TA x 3   REVERSE TOTAL SHOULDER ARTHROPLASTY Bilateral    TONSILLECTOMY AND ADENOIDECTOMY     age 28    TOTAL HIP ARTHROPLASTY Right 02/12/2023   Procedure: RIGHT TOTAL HIP ARTHROPLASTY ANTERIOR APPROACH;  Surgeon: Ollen Gross, MD;  Location: [facility];  Service: Orthopedics;  Laterality: Right;   TOTAL KNEE ARTHROPLASTY Bilateral    TOTAL KNEE REVISION Right 12/25/2017   TRANSURETHRAL RESECTION OF PROSTATE     x 2   UPPER GASTROINTESTINAL ENDOSCOPY     HPI:  Pt is an 82 yo male admitted with AMS s/p fall at SNF. CT Head revealed extensive bifrontal contusions R>L, diffuse SAH, small bilateral  SDH, and R frontal skull fx. PMH includes: CVA, mild cognitive decline, GERD, recent THA 01/2023, wound infection and likely septic arthritis 02/2023    Assessment / Plan / Recommendation  Clinical Impression  Pt is drowsy this morning but responds briskly when presented with PO trials. No overt signs of aspiration are noted. With solids, oral transit seems to be prolonged with mild oral residue present. When also considering his mentation, recommend adjusting diet to mechanical soft with thin liquids. SLP will f/u for swallowing - also recommend considering SLP cognitive-linguistic evaluation. SLP Visit Diagnosis: Dysphagia, unspecified (R13.10)    Aspiration Risk  Mild aspiration risk;Moderate aspiration risk    Diet Recommendation Dysphagia 3 (Mech soft);Thin liquid    Liquid Administration via: Cup;Straw Medication Administration: Whole meds with liquid (use puree PRN) Supervision: Staff to assist with self feeding;Full supervision/cueing for compensatory strategies Compensations: Minimize environmental distractions;Slow rate;Small sips/bites Postural Changes: Seated upright at 90 degrees    Other  Recommendations Oral Care Recommendations: Oral care BID    Recommendations for follow up therapy are one component of a multi-disciplinary discharge planning process, led by the attending physician.  Recommendations may be updated based on patient status, additional functional criteria and insurance authorization.  Follow up Recommendations Skilled nursing-short term rehab (<3 hours/day)      Assistance Recommended  at Discharge    Functional Status Assessment Patient has had a recent decline in their functional status and demonstrates the ability to make significant improvements in function in a reasonable and predictable amount of time.  Frequency and Duration min 2x/week  2 weeks       Prognosis Prognosis for improved oropharyngeal function: Good Barriers to Reach Goals: Cognitive  deficits      Swallow Study   General HPI: Pt is an 82 yo male admitted with AMS s/p fall at SNF. CT Head revealed extensive bifrontal contusions R>L, diffuse SAH, small bilateral SDH, and R frontal skull fx. PMH includes: CVA, mild cognitive decline, GERD, recent THA 01/2023, wound infection and likely septic arthritis 02/2023 Type of Study: Bedside Swallow Evaluation Previous Swallow Assessment: none in chart Diet Prior to this Study: Regular;Thin liquids (Level 0) Temperature Spikes Noted: No Respiratory Status: Room air History of Recent Intubation: No Behavior/Cognition: Lethargic/Drowsy;Cooperative;Requires cueing Oral Cavity Assessment: Within Functional Limits Oral Care Completed by SLP: No Oral Cavity - Dentition: Adequate natural dentition Self-Feeding Abilities: Needs assist Patient Positioning: Upright in bed Baseline Vocal Quality: Normal Volitional Cough: Cognitively unable to elicit Volitional Swallow: Unable to elicit    Oral/Motor/Sensory Function Overall Oral Motor/Sensory Function: Other (comment) (cognitively not following commands to assess)   Ice Chips Ice chips: Within functional limits Presentation: Spoon   Thin Liquid Thin Liquid: Within functional limits Presentation: Straw    Nectar Thick Nectar Thick Liquid: Not tested   Honey Thick Honey Thick Liquid: Not tested   Puree Puree: Within functional limits Presentation: Spoon   Solid     Solid: Impaired Oral Phase Functional Implications: Prolonged oral transit;Oral residue      Mahala Menghini., M.A. CCC-SLP Acute Rehabilitation Services Office [phone]  Secure chat preferred  04/27/2023,12:52 PM

## 2023-04-27 NOTE — Progress Notes (Addendum)
1920 - During report, discussed recent neuro changes with dayshift RN. Pt had just returned from head CT, awaiting impression.   2015 - Upon pt assessment, pt more lethargic and difficult to arouse. Meyran, MD notified. Discussed pt assessment and plan of care with Meyran, MD. No new orders at this time. This RN will continue to monitor and assess the pt and notify of any additional changes.   2045 - Pt previously took medications with sips of water; however, pt is too lethargic at this time to do so. E-link called and notified. This RN spoke with Valora Piccolo, MD to clarify holing all PO meds and adjusting medication routes as needed. This RN will continue to monitor and assess the pt and notify of any additional changes.   2345 - Pt's BP continues to be above ordered parameters despite PRN labetalol. Valora Piccolo, MD notified. Labetalol order modified. This RN will continue to monitor and assess the pt and notify of any additional changes.

## 2023-04-27 NOTE — Progress Notes (Signed)
NAME:  YONATAN ZOTTOLA, MRN:  [id number], DOB:  Oct 07, 1940, LOS: 1 ADMISSION DATE:  04/26/2023, CONSULTATION DATE:  11/30 REFERRING MD:  Tee-EDP, CHIEF COMPLAINT:  traumatic SAH   History of Present Illness:  [name] is an 82 y/o gentleman with a history of mild cognitive decline, recent THA in 01/2023, wound infection and likely septic arthritis after a fall causing injury to his incision in Oct 2024 who presented with confusion after a fall this morning. He had an unwitnessed fall around 5:45 this morning at his rehab, and family visiting around 11AM felt that he was not acting normally and noticed the injury on the back of his head. They had him brought to the ED where he was found to have a SAH. He fell about a week ago as well, but no known injuries from that fall. He has previously been on plavix. He had a headache last night per his daughter, but otherwise has not had complaints. He is now more confused than his baseline per daughter. He completed his planned IV course of cefazolin on 11/28 and had his PICC removed on 11/29. He was started on cefadroxil suppressive therapy as planned after completion of his IV course of antibiotics. His family had been working on a safe discharge plan for him this week with some difficulty-- their mother has cognitive impairment and his daughter and the SW do not feel that [name] could safely be at home caring for himself. He has felt pretty strongly that he wants to go home and told his daughter he does not ever want to live in a facility. He has a form indicating he is full code at bedside. His daughter is POA.   Pertinent  Medical History  DVT Stroke SIADH with chronic hyponatrremia HTN GERD BPH Recent MSSA bacteremia, SSI with likely joint involvement (Oct 2024) R THA with recent possible periprosthetic fracture Obesity CAD  Significant Hospital Events: Including procedures, antibiotic start and stop dates in addition to other pertinent events    11/30 admitted  Interim History / Subjective:  No acute events overnight.  A bit delirious this morning.  Objective   Blood pressure (!) 144/79, pulse (!) 111, temperature 99.4 F (37.4 C), temperature source Axillary, resp. rate (!) 27, height 5\' 5"  (1.651 m), weight 87.8 kg, SpO2 (!) 86%.        Intake/Output Summary (Last 24 hours) at 04/27/2023 1024 Last data filed at 04/27/2023 1000 Gross per 24 hour  Intake 245.49 ml  Output 1570 ml  Net -1324.51 ml   Filed Weights   04/26/23 1230 04/27/23 0500  Weight: 90.7 kg 87.8 kg    Examination: General: chronically ill appearing man lying in bed in NAD HENT: injury with dried blood on the back of his scalp. Lungs: CTAB, breathing comfortably on RA Cardiovascular: S1S2, RRR Abdomen: soft, NT Extremities: healed R anterior hip incision, +BLE edema Neuro: awake, alert, moves all 4 extremities somewhat purposefully, delirious, difficulty following commands  Labs reviewed, creatinine stable  Ct head personally reviewed: SAH, mostly on the R, some blod anteriorly on the left  Resolved Hospital Problem list     Assessment & Plan:  Traumatic SAH from fall hitting the back of his head Suspect he may have a concussion/ mild TBI as well -appreciate neurosurgery assistance, repeat imaging further recommendations -Repeat CT head 12/1 with expected changes of bifrontal hemorrhagic contusions, no change in subarachnoid or subdural clot, no herniation, no shift -Continue ICU care for frequent neurochecks  for plan minimum 72 hours -if worsening mental status, repeat head CT STAT -goal SBP ~140, cleviprex -hold plavix -PT, OT, SLP  Hypertension: -Home amlodipine 10 mg daily, Coreg 6.25 mg twice daily, consider increasing Coreg if unable to come off clevidipine drip  Frequent falls- 2 in the past week -infectious workup- CXR, UA, reassuring, blood cultures pending -PT, OT  Recent septic arthritis of prosthetic hip -finished course  of cefazolin this week, con't cefadroxil as prescribed -con't acetaminophen PRN for pain  Chronic hyponatremia due to SIADH: Na levels stable -con't salt tabs -monitor -avoid hypotonic fluids, fluid restriction unless develops hypovolemia leading to kidney injury  Hyperglycemia -SSI PRN -goal BG 140-180  Anemia Leukocytosis-possibly reactive -transfuse for Hb <7 or hemodynamically significant bleeding -monitor  Urinary retention due to BPH: Complicated Foley placement requiring urology assistance 12/1 early a.m. -Flomax -Could continue Foley, trial of void in 5 to 7 days  GERD -con't PTA pepcid & PPI  Leg edema; likely chronic -con't PTA lasix; increase as needed based on exam  History of allergic rhinitis -claritin, flonase  -hold PTA benadryl  Insomnia -melatonin, hold trazodone   Best Practice (right click and "Reselect all SmartList Selections" daily)   Diet/type: Regular consistency (see orders) DVT prophylaxis: SCDs Start: 04/26/23 1626SCDs Pressure ulcer(s): present on admission - sacral stage 1 GI prophylaxis: H2B Lines: N/A Foley:  Yes, and it is still needed Code Status:  full code Last date of multidisciplinary goals of care discussion [11/30 with daughter in ED]  Labs   CBC: Recent Labs  Lab 04/26/23 1230 04/26/23 1248 04/27/23 0634  WBC 16.2*  --  16.0*  HGB 9.8* 10.5* 8.7*  HCT 30.5* 31.0* 25.9*  MCV 83.3  --  81.7  PLT 323  --  304    Basic Metabolic Panel: Recent Labs  Lab 04/26/23 1230 04/26/23 1248 04/27/23 0634  NA 126* 126* 129*  K 3.7 3.8 3.6  CL 90* 90* 90*  CO2 22  --  25  GLUCOSE 142* 146* 119*  BUN 13 13 13   CREATININE 1.10 0.90 1.12  CALCIUM 8.6*  --  8.9   GFR: Estimated Creatinine Clearance: 50.9 mL/min (by C-G formula based on SCr of 1.12 mg/dL). Recent Labs  Lab 04/26/23 1230 04/26/23 1249 04/27/23 0634  WBC 16.2*  --  16.0*  LATICACIDVEN  --  1.1  --     Liver Function Tests: Recent Labs  Lab  04/26/23 1230  AST 28  ALT 12  ALKPHOS 106  BILITOT 0.8  PROT 6.9  ALBUMIN 2.9*   No results for input(s): "LIPASE", "AMYLASE" in the last 168 hours. No results for input(s): "AMMONIA" in the last 168 hours.  ABG    Component Value Date/Time   TCO2 27 04/26/2023 1248     Coagulation Profile: Recent Labs  Lab 04/26/23 1230  INR 1.1    Cardiac Enzymes: No results for input(s): "CKTOTAL", "CKMB", "CKMBINDEX", "TROPONINI" in the last 168 hours.  HbA1C: Hgb A1c MFr Bld  Date/Time Value Ref Range Status  04/26/2023 04:41 PM 5.9 (H) 4.8 - 5.6 % Final    Comment:    (NOTE) Pre diabetes:          5.7%-6.4%  Diabetes:              >6.4%  Glycemic control for   <7.0% adults with diabetes   01/18/2022 08:39 PM 5.4 4.8 - 5.6 % Final    Comment:    (NOTE) Pre diabetes:  5.7%-6.4%  Diabetes:              >6.4%  Glycemic control for   <7.0% adults with diabetes     CBG: Recent Labs  Lab 04/26/23 1636 04/26/23 1928 04/26/23 2315 04/27/23 0330 04/27/23 0758  GLUCAP 147* 150* 125* 140* 108*    Review of Systems:   ROS limited due to patient's confusion- he denies all pain.   Past Medical History:  He,  has a past medical history of Anxiety, Arthritis, BPH (benign prostatic hyperplasia), Cancer of skin, squamous cell, Cataract, GERD (gastroesophageal reflux disease), Hypertension, Shingles, SIADH (syndrome of inappropriate ADH production) (HCC), and Stroke (HCC).   Surgical History:   Past Surgical History:  Procedure Laterality Date   CARPAL TUNNEL RELEASE Left 09/09/2022   Procedure: LEFT CARPAL TUNNEL RELEASE;  Surgeon: Betha Loa, MD;  Location: [facility];  Service: Orthopedics;  Laterality: Left;  Bier block   CATARACT EXTRACTION, BILATERAL     COLONOSCOPY  06/27/2020   COLONOSCOPY  2018   EYE SURGERY     gum graft     HIP ARTHROPLASTY Right 03/13/2023   Procedure: IRRIGATION AND DEBRIDEMENT RIGHT HIP;  Surgeon: Ollen Gross, MD;  Location: [facility];  Service: Orthopedics;  Laterality: Right;   INGUINAL HERNIA REPAIR     as infant   LUMBAR LAMINECTOMY  06/2015   POLYPECTOMY  2018   TA x 3   REVERSE TOTAL SHOULDER ARTHROPLASTY Bilateral    TONSILLECTOMY AND ADENOIDECTOMY     age 69    TOTAL HIP ARTHROPLASTY Right 02/12/2023   Procedure: RIGHT TOTAL HIP ARTHROPLASTY ANTERIOR APPROACH;  Surgeon: Ollen Gross, MD;  Location: [facility];  Service: Orthopedics;  Laterality: Right;   TOTAL KNEE ARTHROPLASTY Bilateral    TOTAL KNEE REVISION Right 12/25/2017   TRANSURETHRAL RESECTION OF PROSTATE     x 2   UPPER GASTROINTESTINAL ENDOSCOPY       Social History:   reports that he has never smoked. He has never used smokeless tobacco. He reports that he does not currently use alcohol after a past usage of about 2.0 standard drinks of alcohol per week. He reports that he does not use drugs.   Family History:  His family history includes Colon polyps (age of onset: 65) in his mother; Stroke in his maternal grandfather. There is no history of Colon cancer, Esophageal cancer, Rectal cancer, or Stomach cancer.   Allergies Allergies  Allergen Reactions   Percocet [Oxycodone-Acetaminophen] Itching     Home Medications  Prior to Admission medications   Medication Sig Start Date End Date Taking? Authorizing Provider  amLODipine (NORVASC) 10 MG tablet TAKE 1 TABLET BY MOUTH AT  BEDTIME 06/29/22  Yes Etta Grandchild, MD  ascorbic acid (VITAMIN C) 1000 MG tablet Take 1,000 mg by mouth at bedtime.   Yes [provider]  atorvastatin (LIPITOR) 10 MG tablet TAKE 1 TABLET(10 MG) BY MOUTH DAILY Patient taking differently: Take 10 mg by mouth daily. 02/28/23  Yes Etta Grandchild, MD  carvedilol (COREG) 6.25 MG tablet Take 1 tablet (6.25 mg total) by mouth 2 (two) times daily. 12/31/22  Yes Lewayne Bunting, MD  cefadroxil (DURICEF) 500 MG capsule Take 2 capsules (1,000 mg total) by mouth 2 (two) times daily. 04/25/23 06/06/23  Yes Danford, Earl Lites, MD  ceFAZolin (ANCEF) 2-3 GM-%(50ML) SOLR Inject 2 g into the vein every 8 (eight) hours. 04/16/23  Yes [provider]  clopidogrel (PLAVIX) 75 MG  tablet TAKE 1 TABLET(75 MG) BY MOUTH DAILY Patient taking differently: Take 75 mg by mouth daily. 02/14/23  Yes Etta Grandchild, MD  cyclobenzaprine (FLEXERIL) 5 MG tablet Take 2 tablets (10 mg total) by mouth 3 (three) times daily as needed for muscle spasms. 02/13/23  Yes Eartha Inch, PA  diphenhydrAMINE (BENADRYL) 25 mg capsule Take 25 mg by mouth daily. For itching related to rash   Yes [provider]  DIPHENHYDRAMINE HCL, TOPICAL, (BENADRYL ITCH STOPPING) 2 % GEL Apply 1 Application topically every 6 (six) hours as needed. To general rash   Yes [provider]  DULoxetine (CYMBALTA) 30 MG capsule Take 1 capsule (30 mg total) by mouth daily. 11/22/22  Yes Jaffe, Adam R, DO  Emollient (EUCERIN EX) Apply 1 application  topically 2 (two) times daily. Apply a thin layer (2 grams) twice daily to affected/itchy skin   Yes [provider]  famotidine (PEPCID) 40 MG tablet Take 40 mg by mouth daily. 04/14/23  Yes [provider]  ferrous sulfate 324 MG TBEC Take 324 mg by mouth daily. Take with Vitamin C   Yes [provider]  fexofenadine (ALLEGRA) 60 MG tablet Take 60 mg by mouth 2 (two) times daily. Give with water only   Yes [provider]  fluticasone (FLONASE) 50 MCG/ACT nasal spray Place 2 sprays into both nostrils daily. 02/07/22  Yes Etta Grandchild, MD  furosemide (LASIX) 40 MG tablet Take 1 tablet (40 mg total) by mouth daily. 03/18/23 03/17/24 Yes Danford, Earl Lites, MD  heparin flush 10 UNIT/ML injection Inject 5 mLs into the vein every 8 (eight) hours as needed. Flush PICC with 10 ml ns, follow with medication administration, flush with 10 ml ns, follow with 5 ml Heparin. 04/18/23  Yes [provider]  HYDROcodone-acetaminophen  (NORCO/VICODIN) 5-325 MG tablet Take 1-2 tablets by mouth every 6 (six) hours as needed for severe pain (pain score 7-10). 03/18/23  Yes Danford, Earl Lites, MD  loratadine (CLARITIN) 10 MG tablet Take 10 mg by mouth daily.   Yes [provider]  Melatonin 10 MG TABS Take by mouth.   Yes [provider]  Menthol, Topical Analgesic, (BIOFREEZE PROFESSIONAL) 5 % GEL Apply 1 Application topically 4 (four) times daily. Shoulder and Neck stiffness/pain   Yes [provider]  naproxen (NAPROSYN) 250 MG tablet Take 250 mg by mouth 2 (two) times daily. As needed for pain   Yes [provider]  olopatadine (PATANOL) 0.1 % ophthalmic solution Place 1 drop into both eyes at bedtime.   Yes [provider]  psyllium (METAMUCIL) 58.6 % powder Take 1 packet by mouth at bedtime.   Yes [provider]  RABEprazole (ACIPHEX) 20 MG tablet Take 1 tablet (20 mg total) by mouth daily. 10/23/22  Yes Etta Grandchild, MD  sodium chloride 1 g tablet Take 1 g by mouth 2 (two) times daily with a meal.   Yes [provider]  Sodium Chloride Flush (NORMAL SALINE FLUSH) 0.9 % SOLN Inject 10 mLs into the vein every 8 (eight) hours as needed. 10 ml injection, every 8 hours, flush PICC with 10 ml ns, follow with medication administration, flush with 10 ml ns, follow with 5 ml Heparin. 04/18/23  Yes [provider]  tamsulosin (FLOMAX) 0.4 MG CAPS capsule Take 0.4 mg by mouth at bedtime. 09/17/19  Yes [provider]  traZODone (DESYREL) 50 MG tablet Take 25 mg by mouth at bedtime. 05/08/22  Yes  [provider]     Critical care time:     CRITICAL CARE Performed by: Karren Burly   Total critical care time: 33 minutes  Critical care time was exclusive of separately billable procedures and treating other patients.  Critical care was necessary to treat or prevent imminent or life-threatening deterioration.  Critical care was time  spent personally by me on the following activities: development of treatment plan with patient and/or surrogate as well as nursing, discussions with consultants, evaluation of patient's response to treatment, examination of patient, obtaining history from patient or surrogate, ordering and performing treatments and interventions, ordering and review of laboratory studies, ordering and review of radiographic studies, pulse oximetry and re-evaluation of patient's condition.   Karren Burly, MD 04/27/23 10:24 AM [facility]  For contact information, see Amion. If no response to pager, please call PCCM consult pager. After hours, 7PM- 7AM, please call Elink.

## 2023-04-27 NOTE — Progress Notes (Signed)
eLink Physician-Brief Progress Note Patient Name: Kenneth Montoya DOB: June 03, 1940 MRN: [id number]   Date of Service  04/27/2023  HPI/Events of Note  Notified of hypertension with BP 156/77, HR 115, RR 22, O2 sats 98%.  Pt given IV labetalol.   Pt also noted to have temp of 101.45F. Urinalysis is clear.  CXR with atelectatic changes.   eICU Interventions  Increase the dose of PRN labetalol to 20mg .  Tylenol changed to suppository PRN.  Will order repeat CXR.      Intervention Category Intermediate Interventions: Hypertension - evaluation and management  Larinda Buttery 04/27/2023, 11:52 PM

## 2023-04-27 NOTE — Progress Notes (Signed)
eLink Physician-Brief Progress Note Patient Name: Kenneth Montoya DOB: 1941/03/12 MRN: [id number]   Date of Service  04/27/2023  HPI/Events of Note    eICU Interventions     870 mlurine on bladder scan Faile foley with clots Unable to place Coude  Urology contacted They will   assess pt     Massie Maroon 04/27/2023, 1:32 AM

## 2023-04-27 NOTE — Progress Notes (Signed)
Patient more lethargic. Requiring sternal rub to open eyes. Nonverbal, not responding to questions or following commands/cueing. Strength X4 extremities appears unchanged, no weakness observed. Dr. Wynetta Emery aware, STAT CTH done.

## 2023-04-27 NOTE — Evaluation (Signed)
Physical Therapy Evaluation Patient Details Name: Kenneth Montoya MRN: [id number] DOB: August 29, 1940 Today's Date: 04/27/2023  History of Present Illness  82 y.o. M admitted 11/30 after fall at [facility]- rehab with Diffuse SAH in the right sylvian fissure and suprasellar cisterns, Bil SDH and nondisplaced Rt paramedian frontal skull fx. PMhx: HTN, CAD, TIA, PVD, HLD, and SIADH with chronic hyponatremia, Rt TKA, bil reverse TSA. Rt THA  02/12/23 , fall 03/10/23 with nondisplaced fx of acetabulum with fluid in Rt quadriceps s/p I&D Rt hip 03/13/23.  Clinical Impression  Pt unable to respond to questions throughout session and when asked pain he stated "10,9,8,7,6,5,4,3,2,1". Pt impulsive and using gown to blow his nose, pulling at lines and linens. Pt responds well to gestures at time and requires physical assist for transfers and total assist for pericare given incontinent bowel during session. Pt with decreased transfers, cognition, balance and function who will benefit from acute therapy to maximize mobility, safety and independence to decrease burden of care.   Supine 136/74 (91) Sitting 148/70 (91) In Chair end of session 153/116 (130) HR 106 95% on RA      If plan is discharge home, recommend the following: A lot of help with walking and/or transfers;A lot of help with bathing/dressing/bathroom;Assistance with cooking/housework;Direct supervision/assist for medications management;Assist for transportation;Supervision due to cognitive status;Direct supervision/assist for financial management   Can travel by private vehicle   No    Equipment Recommendations BSC/3in1  Recommendations for Other Services  OT consult;Speech consult    Functional Status Assessment Patient has had a recent decline in their functional status and/or demonstrates limited ability to make significant improvements in function in a reasonable and predictable amount of time     Precautions / Restrictions  Precautions Precautions: Fall      Mobility  Bed Mobility Overal bed mobility: Needs Assistance Bed Mobility: Supine to Sit     Supine to sit: Mod assist     General bed mobility comments: assist to initiate movement of legs off the bed with multimidal cues to elevate trunk and min assist from HOB 35 degrees    Transfers Overall transfer level: Needs assistance   Transfers: Sit to/from Stand, Bed to chair/wheelchair/BSC Sit to Stand: Min assist, +2 safety/equipment Stand pivot transfers: Min assist, +2 safety/equipment         General transfer comment: initially attempted standing with RW present, pt reaching for RW but with standing pushing RW away and unable to bring feet into device. 2nd trial with gestures and HHA pt able to stand and pivot to chair with min +2 assist for safety and lines with bil UE support. Pt incontinent of stool with mobility and stood additional 2 trials from chair for use of bedpan and pericare with total assist, cues for safety and hand placement    Ambulation/Gait               General Gait Details: not yet ready to attempt due to cognitive impairments  Stairs            Wheelchair Mobility     Tilt Bed    Modified Rankin (Stroke Patients Only) Modified Rankin (Stroke Patients Only) Pre-Morbid Rankin Score: Moderate disability Modified Rankin: Moderately severe disability     Balance Overall balance assessment: Needs assistance   Sitting balance-Leahy Scale: Fair Sitting balance - Comments: EOB with CGA for safety   Standing balance support: Bilateral upper extremity supported Standing balance-Leahy Scale: Poor Standing balance comment: single and  bil UE support in standing                             Pertinent Vitals/Pain Pain Assessment Pain Assessment: CPOT Facial Expression: Relaxed, neutral Body Movements: Absence of movements Muscle Tension: Relaxed Compliance with ventilator (intubated pts.):  N/A Vocalization (extubated pts.): Talking in normal tone or no sound CPOT Total: 0 Pain Intervention(s): Repositioned, Limited activity within patient's tolerance    Home Living Family/patient expects to be discharged to:: Skilled nursing facility                        Prior Function               Mobility Comments: per last admission was living at home prior to THA and using RW. Pt currently unable to provide PLOF at [facility]       Extremity/Trunk Assessment   Upper Extremity Assessment Upper Extremity Assessment: Defer to OT evaluation    Lower Extremity Assessment Lower Extremity Assessment: Generalized weakness (grossly 3/5, unable to fully assess due to impaired cognition)    Cervical / Trunk Assessment Cervical / Trunk Assessment: Kyphotic  Communication   Communication Communication: Hearing impairment;Difficulty communicating thoughts/reduced clarity of speech;Difficulty following commands/understanding Following commands: Follows one step commands inconsistently;Follows one step commands with increased time Cueing Techniques: Gestural cues;Tactile cues;Verbal cues  Cognition Arousal: Alert Behavior During Therapy: Flat affect, Impulsive Overall Cognitive Status: Difficult to assess Area of Impairment: Following commands, Safety/judgement                       Following Commands: Follows one step commands inconsistently, Follows one step commands with increased time Safety/Judgement: Decreased awareness of safety, Decreased awareness of deficits     General Comments: pt following multimodal gestural and verbal cues but not following verbal cues alone        General Comments      Exercises     Assessment/Plan    PT Assessment Patient needs continued PT services  PT Problem List Decreased strength;Decreased mobility;Decreased range of motion;Decreased activity tolerance;Decreased cognition;Decreased balance;Decreased knowledge of use of  DME;Decreased coordination;Decreased knowledge of precautions       PT Treatment Interventions DME instruction;Gait training;Functional mobility training;Therapeutic activities;Patient/family education;Cognitive remediation;Neuromuscular re-education;Balance training;Therapeutic exercise    PT Goals (Current goals can be found in the Care Plan section)  Acute Rehab PT Goals PT Goal Formulation: Patient unable to participate in goal setting Time For Goal Achievement: 05/11/23 Potential to Achieve Goals: Fair    Frequency Min 1X/week     Co-evaluation               AM-PAC PT "6 Clicks" Mobility  Outcome Measure Help needed turning from your back to your side while in a flat bed without using bedrails?: A Lot Help needed moving from lying on your back to sitting on the side of a flat bed without using bedrails?: A Lot Help needed moving to and from a bed to a chair (including a wheelchair)?: A Lot Help needed standing up from a chair using your arms (e.g., wheelchair or bedside chair)?: A Little Help needed to walk in hospital room?: Total Help needed climbing 3-5 steps with a railing? : Total 6 Click Score: 11    End of Session Equipment Utilized During Treatment: Gait belt Activity Tolerance: Patient tolerated treatment well Patient left: in chair;with call bell/phone within reach;with chair alarm  set Nurse Communication: Mobility status PT Visit Diagnosis: Other abnormalities of gait and mobility (R26.89);History of falling (Z91.81);Muscle weakness (generalized) (M62.81)    Time: 1206-1230 PT Time Calculation (min) (ACUTE ONLY): 24 min   Charges:   PT Evaluation $PT Eval Moderate Complexity: 1 Mod PT Treatments $Therapeutic Activity: 8-22 mins PT General Charges $$ ACUTE PT VISIT: 1 Visit         Merryl Hacker, PT Acute Rehabilitation Services Office: [phone]   Enedina Finner Abagail Limb 04/27/2023, 1:24 PM

## 2023-04-27 NOTE — Consult Note (Signed)
I have been asked to see the patient by Dr. Massie Maroon for evaluation and management of difficult foley placement and urinary retention.  History of present illness: 82 year old man with recent hip replacement in September 2024 complicated by septic arthritis presented to the ED after a fall at rehab yesterday morning.  CT showed SAH.  Urology was called after bladder scan showed 875 cc and Foley inserted in the ED did not have any urine output.  The Foley was removed and a coud catheter was attempted without return of urine.  He has a history of BPH (prior TURP x 2) and is currently on tamsulosin and Gemtesa.  He was last seen at [facility] urology in January 2024 and called to cancel his appointment in October 2024.   Review of systems: Obtained from chart review due to patient being hard of hearing and disoriented  Patient Active Problem List   Diagnosis Date Noted   SAH (subarachnoid hemorrhage) (HCC) 04/26/2023   Obesity (BMI 30-39.9) 03/14/2023   Leg swelling 03/12/2023   Acute kidney injury 03/12/2023   PVD (peripheral vascular disease) (HCC) 03/12/2023   Normocytic anemia 03/12/2023   Transient hypoxia, resolved 03/11/2023   Possible closed right periprosthetic hip fracture, initial encounter (HCC) 03/11/2023   Acute metabolic encephalopathy 03/11/2023   MSSA bacteremia due to operative wound infection 03/11/2023   Adjustment disorder 03/11/2023   OA (osteoarthritis) of hip 02/12/2023   Osteoarthritis of right hip 02/12/2023   Lumbar stenosis 01/25/2022   TIA (transient ischemic attack) 01/18/2022   Allergic contact dermatitis due to drugs in contact with skin 10/09/2021   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/04/2021   SIADH (syndrome of inappropriate ADH production) (HCC) 01/10/2021   Hyponatremia 01/05/2021   Hyperlipidemia with target LDL less than 100 12/17/2020   Stroke (HCC) 12/09/2020   Cerebral infarction (HCC) 12/09/2020   Psychophysiological insomnia 11/21/2020   GAD  (generalized anxiety disorder) 11/21/2020   BPH (benign prostatic hyperplasia)    Essential hypertension 11/08/2019   Gastroesophageal reflux disease without esophagitis 11/08/2019   Herpes simplex 11/08/2019   Primary erectile dysfunction 11/08/2019   Senile purpura (HCC) 05/13/2019   Chronic allergic rhinitis due to pollen 03/24/2017   LPRD (laryngopharyngeal reflux disease) 03/24/2017   Bladder outflow obstruction 03/04/2016    No current facility-administered medications on file prior to encounter.   Current Outpatient Medications on File Prior to Encounter  Medication Sig Dispense Refill   amLODipine (NORVASC) 10 MG tablet TAKE 1 TABLET BY MOUTH AT  BEDTIME 90 tablet 3   ascorbic acid (VITAMIN C) 1000 MG tablet Take 1,000 mg by mouth at bedtime.     atorvastatin (LIPITOR) 10 MG tablet TAKE 1 TABLET(10 MG) BY MOUTH DAILY (Patient taking differently: Take 10 mg by mouth daily.) 90 tablet 0   carvedilol (COREG) 6.25 MG tablet Take 1 tablet (6.25 mg total) by mouth 2 (two) times daily. 180 tablet 3   cefadroxil (DURICEF) 500 MG capsule Take 2 capsules (1,000 mg total) by mouth 2 (two) times daily.     ceFAZolin (ANCEF) 2-3 GM-%(50ML) SOLR Inject 2 g into the vein every 8 (eight) hours.     clopidogrel (PLAVIX) 75 MG tablet TAKE 1 TABLET(75 MG) BY MOUTH DAILY (Patient taking differently: Take 75 mg by mouth daily.) 90 tablet 1   cyclobenzaprine (FLEXERIL) 5 MG tablet Take 2 tablets (10 mg total) by mouth 3 (three) times daily as needed for muscle spasms. 40 tablet 0   diphenhydrAMINE (BENADRYL) 25 mg capsule Take 25  mg by mouth daily. For itching related to rash     DIPHENHYDRAMINE HCL, TOPICAL, (BENADRYL ITCH STOPPING) 2 % GEL Apply 1 Application topically every 6 (six) hours as needed. To general rash     DULoxetine (CYMBALTA) 30 MG capsule Take 1 capsule (30 mg total) by mouth daily. 30 capsule 5   Emollient (EUCERIN EX) Apply 1 application  topically 2 (two) times daily. Apply a thin  layer (2 grams) twice daily to affected/itchy skin     famotidine (PEPCID) 40 MG tablet Take 40 mg by mouth daily.     ferrous sulfate 324 MG TBEC Take 324 mg by mouth daily. Take with Vitamin C     fexofenadine (ALLEGRA) 60 MG tablet Take 60 mg by mouth 2 (two) times daily. Give with water only     fluticasone (FLONASE) 50 MCG/ACT nasal spray Place 2 sprays into both nostrils daily. 48 g 1   furosemide (LASIX) 40 MG tablet Take 1 tablet (40 mg total) by mouth daily.     heparin flush 10 UNIT/ML injection Inject 5 mLs into the vein every 8 (eight) hours as needed. Flush PICC with 10 ml ns, follow with medication administration, flush with 10 ml ns, follow with 5 ml Heparin.     HYDROcodone-acetaminophen (NORCO/VICODIN) 5-325 MG tablet Take 1-2 tablets by mouth every 6 (six) hours as needed for severe pain (pain score 7-10). 42 tablet 0   loratadine (CLARITIN) 10 MG tablet Take 10 mg by mouth daily.     Melatonin 10 MG TABS Take by mouth.     Menthol, Topical Analgesic, (BIOFREEZE PROFESSIONAL) 5 % GEL Apply 1 Application topically 4 (four) times daily. Shoulder and Neck stiffness/pain     naproxen (NAPROSYN) 250 MG tablet Take 250 mg by mouth 2 (two) times daily. As needed for pain     olopatadine (PATANOL) 0.1 % ophthalmic solution Place 1 drop into both eyes at bedtime.     psyllium (METAMUCIL) 58.6 % powder Take 1 packet by mouth at bedtime.     RABEprazole (ACIPHEX) 20 MG tablet Take 1 tablet (20 mg total) by mouth daily. 90 tablet 1   sodium chloride 1 g tablet Take 1 g by mouth 2 (two) times daily with a meal.     Sodium Chloride Flush (NORMAL SALINE FLUSH) 0.9 % SOLN Inject 10 mLs into the vein every 8 (eight) hours as needed. 10 ml injection, every 8 hours, flush PICC with 10 ml ns, follow with medication administration, flush with 10 ml ns, follow with 5 ml Heparin.     tamsulosin (FLOMAX) 0.4 MG CAPS capsule Take 0.4 mg by mouth at bedtime.     traZODone (DESYREL) 50 MG tablet Take 25 mg  by mouth at bedtime.      Past Medical History:  Diagnosis Date   Anxiety    Arthritis    generalized   BPH (benign prostatic hyperplasia)    Cancer of skin, squamous cell    frozen    Cataract    bilateral sx   GERD (gastroesophageal reflux disease)    on meds   Hypertension    on meds   Shingles    SIADH (syndrome of inappropriate ADH production) (HCC)    Stroke [facility])     Past Surgical History:  Procedure Laterality Date   CARPAL TUNNEL RELEASE Left 09/09/2022   Procedure: LEFT CARPAL TUNNEL RELEASE;  Surgeon: Betha Loa, MD;  Location:  SURGERY CENTER;  Service: Orthopedics;  Laterality:  Left;  Bier block   CATARACT EXTRACTION, BILATERAL     COLONOSCOPY  06/27/2020   COLONOSCOPY  2018   EYE SURGERY     gum graft     HIP ARTHROPLASTY Right 03/13/2023   Procedure: IRRIGATION AND DEBRIDEMENT RIGHT HIP;  Surgeon: Ollen Gross, MD;  Location: [facility];  Service: Orthopedics;  Laterality: Right;   INGUINAL HERNIA REPAIR     as infant   LUMBAR LAMINECTOMY  06/2015   POLYPECTOMY  2018   TA x 3   REVERSE TOTAL SHOULDER ARTHROPLASTY Bilateral    TONSILLECTOMY AND ADENOIDECTOMY     age 40    TOTAL HIP ARTHROPLASTY Right 02/12/2023   Procedure: RIGHT TOTAL HIP ARTHROPLASTY ANTERIOR APPROACH;  Surgeon: Ollen Gross, MD;  Location: [facility];  Service: Orthopedics;  Laterality: Right;   TOTAL KNEE ARTHROPLASTY Bilateral    TOTAL KNEE REVISION Right 12/25/2017   TRANSURETHRAL RESECTION OF PROSTATE     x 2   UPPER GASTROINTESTINAL ENDOSCOPY      Social History   Tobacco Use   Smoking status: Never   Smokeless tobacco: Never  Vaping Use   Vaping status: Never Used  Substance Use Topics   Alcohol use: Not Currently    Alcohol/week: 2.0 standard drinks of alcohol    Types: 2 Standard drinks or equivalent per week    Comment: wine 1-2 x a week and not every week    Drug use: Never    Family History  Problem Relation Age of Onset   Colon polyps Mother 22    Stroke Maternal Grandfather    Colon cancer Neg Hx    Esophageal cancer Neg Hx    Rectal cancer Neg Hx    Stomach cancer Neg Hx     PE: Vitals:   04/26/23 2345 04/27/23 0000 04/27/23 0015 04/27/23 0030  BP: 135/66 138/69 130/68 (!) 130/59  Pulse: 100 98 98 96  Resp: 19 20 (!) 23 20  Temp:  99.2 F (37.3 C)    TempSrc:  Axillary    SpO2: 97% 96% 97% 93%  Weight:      Height:       Patient appears to be in no acute distress  Atraumatic normocephalic head No cervical or supraclavicular lymphadenopathy appreciated No increased work of breathing, no audible wheezes/rhonchi Regular sinus rhythm/rate Abdomen is soft, nontender, nondistended, no CVA or suprapubic tenderness GU: Circ phallus, adequate meatus Lower extremities are symmetric without appreciable edema   Recent Labs    04/26/23 1230 04/26/23 1248  WBC 16.2*  --   HGB 9.8* 10.5*  HCT 30.5* 31.0*   Recent Labs    04/26/23 1230 04/26/23 1248  NA 126* 126*  K 3.7 3.8  CL 90* 90*  CO2 22  --   GLUCOSE 142* 146*  BUN 13 13  CREATININE 1.10 0.90  CALCIUM 8.6*  --    Recent Labs    04/26/23 1230  INR 1.1   No results for input(s): "LABURIN" in the last 72 hours. Results for orders placed or performed during the hospital encounter of 04/26/23  MRSA Next Gen by PCR, Nasal     Status: None   Collection Time: 04/26/23  6:18 PM   Specimen: Nasal Mucosa; Nasal Swab  Result Value Ref Range Status   MRSA by PCR Next Gen NOT DETECTED NOT DETECTED Final    Comment: (NOTE) The GeneXpert MRSA Assay (FDA approved for NASAL specimens only), is one component of a comprehensive MRSA colonization  surveillance program. It is not intended to diagnose MRSA infection nor to guide or monitor treatment for MRSA infections. Test performance is not FDA approved in patients less than 30 years old. Performed at [facility], [address], [place], [place] [address]     Imaging: CT Head 04/26/23 IMPRESSION: 1.  Extensive hemorrhagic contusion in the anterior inferior frontal lobes, right greater than left. 2. Diffuse subarachnoid hemorrhage in the right Sylvian fissure and right greater than left suprasellar cisterns. 3. Bilateral subdural hematomas measuring 5 mm on the left and 4 mm on the right. 4. Nondisplaced right paramedian frontal skull fracture extends to the coronal suture. Associated right supraorbital scalp hematoma is present. 5. Stable appearance of diffuse subcortical and periventricular white matter disease. This likely reflects the sequela of chronic microvascular ischemia. 6. 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   Critical Value/emergent results were called by telephone at the time of interpretation on 04/26/2023 at 1:17 pm to provider ANDREW TEE , who verbally acknowledged these results.     Electronically Signed   By: Marin Roberts M.D.   On: 04/26/2023 13:20   Procedure: Patient prepped and draped in the usual sterile fashion.  A 20 French coud Foley catheter was then placed without difficulty and inflated with 10 cc sterile water.  Catheter was secured to leg with StatLock.  Assessment/Plan: BPH with bladder outlet obstruction Acute urinary retention  -20Fr coude foley placed without difficulty -foley drained initially and then stopped, gentle irrigation and approx 1L pink tinged urine drained -continue tamsulosin -do not restart gemtesa -plan for void trial in 5-7 days    Please page with any further questions or concerns. Aylah Yeary D Koralyn Prestage

## 2023-04-27 NOTE — TOC CAGE-AID Note (Signed)
Transition of Care [facility]) - CAGE-AID Screening  Patient Details  Name: Kenneth Montoya MRN: [id number] Date of Birth: 09-28-1940  Clinical Narrative:  Patient to ED after a fall causing a skull fracture and brain injury with multiple bleeds - SAH, SDH, contusion. Patient oriented but confused and perseverating, unable to effectively complete screening at this time.  CAGE-AID Screening: Substance Abuse Screening unable to be completed due to: : Patient unable to participate

## 2023-04-27 NOTE — Progress Notes (Signed)
Subjective: Patient reports patient confused without complaints  Objective: Vital signs in last 24 hours: Temp:  [97.7 F (36.5 C)-99.6 F (37.6 C)] 99.4 F (37.4 C) (12/01 0400) Pulse Rate:  [89-203] 105 (12/01 0730) Resp:  [16-37] 29 (12/01 0730) BP: (88-178)/(57-117) 147/69 (12/01 0730) SpO2:  [87 %-100 %] 87 % (12/01 0730) Weight:  [87.8 kg-90.7 kg] 87.8 kg (12/01 0500)  Intake/Output from previous day: 11/30 0701 - 12/01 0700 In: 243.5 [I.V.:194.1; IV Piggyback:49.4] Out: 1345 [Urine:1345] Intake/Output this shift: No intake/output data recorded.  Encephalopathic oriented x 1 follows commands intermittently moves all extremities  Lab Results: Recent Labs    04/26/23 1230 04/26/23 1248 04/27/23 0634  WBC 16.2*  --  16.0*  HGB 9.8* 10.5* 8.7*  HCT 30.5* 31.0* 25.9*  PLT 323  --  304   BMET Recent Labs    04/26/23 1230 04/26/23 1248 04/27/23 0634  NA 126* 126* 129*  K 3.7 3.8 3.6  CL 90* 90* 90*  CO2 22  --  25  GLUCOSE 142* 146* 119*  BUN 13 13 13   CREATININE 1.10 0.90 1.12  CALCIUM 8.6*  --  8.9    Studies/Results: CT HEAD WO CONTRAST ( )  Result Date: 04/27/2023 CLINICAL DATA:  Subdural hematoma. EXAM: CT HEAD WITHOUT CONTRAST TECHNIQUE: Contiguous axial images were obtained from the base of the skull through the vertex without intravenous contrast. RADIATION DOSE REDUCTION: This exam was performed according to the departmental dose-optimization program which includes automated exposure control, adjustment of the mA and/or kV according to patient size and/or use of iterative reconstruction technique. COMPARISON:  Head CT from yesterday FINDINGS: Brain: Bifrontal hemorrhagic contusion with expected blooming. The greatest increases in the inferior left frontal lobe where hemorrhagic component measures 14 mm in length, previously 5 mm. Associated increase in swelling around the multifocal frontal contusion. Subdural hematoma along the bilateral cerebral  convexity without significant increase, up to 6 mm in thickness at the superior left frontal convexity. Subarachnoid hemorrhage greatest along the inferior frontal lobes and right sylvian fissure. No cerebral aneurysm on 01/18/2022 CTA. Mild leftward anterior midline shift. No entrapment or complicating infarct. There are chronic infarcts seen in the left cerebellum, bilateral occipital lobe, and right posterior frontal lobe. Vascular: Largely obscured vessels due to subarachnoid hemorrhage on the right. Skull: Nondepressed right para median frontal bone fracture reaching the right frontal sinus. Sinuses/Orbits: No detected injury. IMPRESSION: 1. Expected blooming of bifrontal hemorrhagic contusions. No significant change in subarachnoid and subdural clot. 2. No herniation or significant shift. Electronically Signed   By: Tiburcio Pea M.D.   On: 04/27/2023 05:17   DG CHEST PORT 1 VIEW  Result Date: 04/26/2023 CLINICAL DATA:  Pain after fall EXAM: PORTABLE CHEST 1 VIEW COMPARISON:  X-ray 03/10/2023 and older FINDINGS: Underinflation. Normal cardiopericardial silhouette when adjusting for level of inflation. Calcified aorta no pneumothorax or effusion. Chronic lung changes no consolidation or edema. Overlapping cardiac leads. Bilateral shoulder arthroplasties. IMPRESSION: Underinflation with chronic lung changes. Electronically Signed   By: Karen Kays M.D.   On: 04/26/2023 16:17   DG Pelvis Portable  Result Date: 04/26/2023 CLINICAL DATA:  Pain after fall EXAM: PORTABLE PELVIS 1 VIEWS COMPARISON:  None Available. FINDINGS: Right hip arthroplasty identified with Press-Fit components. The tip of the femoral component is not included in the imaging field. What is seen of the prosthesis is without hardware failure. Chondrocalcinosis along the pubic symphysis. Slight joint space loss of the left hip. Osteopenia. No fracture or dislocation. Degenerative  changes of the visualized lumbar spine at the edge of  the imaging field. Overlapping cardiac leads. Hyperostosis. Vascular calcifications. With this level of osteopenia subtle nondisplaced injury is difficult to exclude in the setting of osteopenia and if there is further concern follow up CT is recommended for further delineation IMPRESSION: Osteopenia with degenerative changes. Right hip arthroplasty. Chondrocalcinosis Electronically Signed   By: Karen Kays M.D.   On: 04/26/2023 15:26   CT CERVICAL SPINE WO CONTRAST  Result Date: 04/26/2023 CLINICAL DATA:  Poly trauma. Unwitnessed fall. The patient is on blood thinners. EXAM: CT CERVICAL SPINE WITHOUT CONTRAST TECHNIQUE: Multidetector CT imaging of the cervical spine was performed without intravenous contrast. Multiplanar CT image reconstructions were also generated. RADIATION DOSE REDUCTION: This exam was performed according to the departmental dose-optimization program which includes automated exposure control, adjustment of the mA and/or kV according to patient size and/or use of iterative reconstruction technique. COMPARISON:  None Available. FINDINGS: Alignment: Grade 1 degenerative anterolisthesis present at C4-5. Slight degenerative anterolisthesis is present at C7-T1. No other significant listhesis is present. Straightening of the normal cervical lordosis is present. Skull base and vertebrae: The craniocervical junction is normal. Vertebral body heights are maintained. No acute fractures are present. Degenerative endplate sclerotic changes are present C5-6 and C6-7. Soft tissues and spinal canal: No prevertebral fluid or swelling. No visible canal hematoma. Disc levels: Multilevel degenerative changes are present throughout the cervical spine. Uncovertebral and facet hypertrophy lead to moderate to severe right foraminal stenosis at C4-5. Right greater than left foraminal narrowing is present at C5-6 and C6-7. Upper chest: The lung apices are not imaged. IMPRESSION: 1. No acute fracture or traumatic  subluxation. 2. Multilevel degenerative changes of the cervical spine as described. Electronically Signed   By: Marin Roberts M.D.   On: 04/26/2023 13:24   CT HEAD WO CONTRAST  Result Date: 04/26/2023 CLINICAL DATA:  Unwitnessed fall. Altered mental status. The patient takes Plavix for a DVT. EXAM: CT HEAD WITHOUT CONTRAST TECHNIQUE: Contiguous axial images were obtained from the base of the skull through the vertex without intravenous contrast. RADIATION DOSE REDUCTION: This exam was performed according to the departmental dose-optimization program which includes automated exposure control, adjustment of the mA and/or kV according to patient size and/or use of iterative reconstruction technique. COMPARISON:  CT head without contrast 09/18/2021 FINDINGS: Brain: Extensive hemorrhagic contusion is present in the anterior inferior frontal lobes, right greater than left. Diffuse subarachnoid hemorrhage present in the right sylvian fissure and right greater than left suprasellar cisterns. Bilateral subdural hematomas are present measuring 5 mm on the left and 4 mm on the right. Diffuse subcortical and periventricular white matter hypoattenuation is similar the prior study. Remote left cerebellar infarcts are again noted. Remote occipital lobe infarcts are stable. Vascular: No hyperdense vessel or unexpected calcification. Skull: A nondisplaced right paramedian frontal skull fracture extends to the coronal suture. Associated right supraorbital scalp hematoma is present. No radiopaque foreign body or pneumocephalus is present. Sinuses/Orbits: The paranasal sinuses and mastoid air cells are clear. Bilateral lens replacements are present. Globes and orbits are otherwise within normal limits. IMPRESSION: 1. Extensive hemorrhagic contusion in the anterior inferior frontal lobes, right greater than left. 2. Diffuse subarachnoid hemorrhage in the right Sylvian fissure and right greater than left suprasellar cisterns.  3. Bilateral subdural hematomas measuring 5 mm on the left and 4 mm on the right. 4. Nondisplaced right paramedian frontal skull fracture extends to the coronal suture. Associated right supraorbital scalp hematoma is  present. 5. Stable appearance of diffuse subcortical and periventricular white matter disease. This likely reflects the sequela of chronic microvascular ischemia. 6. 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 Critical Value/emergent results were called by telephone at the time of interpretation on 04/26/2023 at 1:17 pm to provider ANDREW TEE , who verbally acknowledged these results. Electronically Signed   By: Marin Roberts M.D.   On: 04/26/2023 13:20    Assessment/Plan: Hospital day 1 significant closed head injury bifrontal contusions subarachnoid hemorrhage small subdural is no significant mass effect CT overall unchanged continue observation unit probably need 72 hours of observation in the ICU with a level of contusion.  No surgical intervention needed at this time  LOS: 1 day     Mariam Dollar 04/27/2023, 8:36 AM

## 2023-04-27 DEATH — deceased

## 2023-04-28 ENCOUNTER — Inpatient Hospital Stay (HOSPITAL_COMMUNITY): Payer: Medicare Other

## 2023-04-28 DIAGNOSIS — R339 Retention of urine, unspecified: Secondary | ICD-10-CM | POA: Diagnosis not present

## 2023-04-28 DIAGNOSIS — I10 Essential (primary) hypertension: Secondary | ICD-10-CM | POA: Diagnosis not present

## 2023-04-28 DIAGNOSIS — L89151 Pressure ulcer of sacral region, stage 1: Secondary | ICD-10-CM

## 2023-04-28 DIAGNOSIS — I609 Nontraumatic subarachnoid hemorrhage, unspecified: Secondary | ICD-10-CM | POA: Diagnosis not present

## 2023-04-28 LAB — BASIC METABOLIC PANEL
Anion gap: 15 (ref 5–15)
BUN: 14 mg/dL (ref 8–23)
CO2: 28 mmol/L (ref 22–32)
Calcium: 8.7 mg/dL — ABNORMAL LOW (ref 8.9–10.3)
Chloride: 89 mmol/L — ABNORMAL LOW (ref 98–111)
Creatinine, Ser: 0.9 mg/dL (ref 0.61–1.24)
GFR, Estimated: >60 mL/min (ref >60)
Glucose, Bld: 130 mg/dL — ABNORMAL HIGH (ref 70–99)
Potassium: 3.3 mmol/L — ABNORMAL LOW (ref 3.5–5.1)
Sodium: 132 mmol/L — ABNORMAL LOW (ref 135–145)

## 2023-04-28 LAB — GLUCOSE, CAPILLARY
Glucose-Capillary: 125 mg/dL — ABNORMAL HIGH (ref 70–99)
Glucose-Capillary: 124 mg/dL — ABNORMAL HIGH (ref 70–99)
Glucose-Capillary: 115 mg/dL — ABNORMAL HIGH (ref 70–99)
Glucose-Capillary: 113 mg/dL — ABNORMAL HIGH (ref 70–99)
Glucose-Capillary: 131 mg/dL — ABNORMAL HIGH (ref 70–99)
Glucose-Capillary: 137 mg/dL — ABNORMAL HIGH (ref 70–99)

## 2023-04-28 LAB — CBC
HCT: 25.8 % — ABNORMAL LOW (ref 39.0–52.0)
Hemoglobin: 8.4 g/dL — ABNORMAL LOW (ref 13.0–17.0)
MCH: 26.4 pg (ref 26.0–34.0)
MCHC: 32.6 g/dL (ref 30.0–36.0)
MCV: 81.1 fL (ref 80.0–100.0)
Platelets: 281 10*3/uL (ref 150–400)
RBC: 3.18 MIL/uL — ABNORMAL LOW (ref 4.22–5.81)
RDW: 15.8 % — ABNORMAL HIGH (ref 11.5–15.5)
WBC: 15.7 10*3/uL — ABNORMAL HIGH (ref 4.0–10.5)
nRBC: 0 % (ref 0.0–0.2)

## 2023-04-28 MED ORDER — INSULIN ASPART 100 UNIT/ML IJ SOLN: 0.0000 [IU] | Freq: Three times a day (TID) | INTRAMUSCULAR | Status: DC

## 2023-04-28 MED ORDER — POTASSIUM CHLORIDE CRYS ER 20 MEQ PO TBCR: 40.0000 meq | EXTENDED_RELEASE_TABLET | Freq: Once | ORAL | Status: DC

## 2023-04-28 MED ORDER — FAMOTIDINE 20 MG PO TABS: 20.0000 mg | ORAL_TABLET | Freq: Two times a day (BID) | ORAL | Status: DC

## 2023-04-28 MED ORDER — INSULIN ASPART 100 UNIT/ML IJ SOLN: 0.0000 [IU] | Freq: Every day | INTRAMUSCULAR | Status: DC

## 2023-04-28 MED ORDER — INSULIN ASPART 100 UNIT/ML IJ SOLN
0.0000 [IU] | INTRAMUSCULAR | Status: DC
  Administered 2023-04-28 – 2023-04-29 (×4): 1 [IU] via SUBCUTANEOUS

## 2023-04-28 MED ORDER — ACETAMINOPHEN 650 MG RE SUPP
650.0000 mg | Freq: Four times a day (QID) | RECTAL | Status: DC | PRN
  Administered 2023-04-28 – 2023-05-07 (×5): 650 mg via RECTAL
  Filled 2023-04-28 (×5): qty 1

## 2023-04-28 MED ORDER — PANTOPRAZOLE SODIUM 40 MG PO TBEC: 40.0000 mg | DELAYED_RELEASE_TABLET | Freq: Every day | ORAL | Status: DC

## 2023-04-28 MED ORDER — HYDRALAZINE HCL 20 MG/ML IJ SOLN
10.0000 mg | INTRAMUSCULAR | Status: DC | PRN
  Administered 2023-04-28 – 2023-04-29 (×2): 10 mg via INTRAVENOUS
  Filled 2023-04-28 (×2): qty 1

## 2023-04-28 MED ORDER — POTASSIUM CHLORIDE 10 MEQ/100ML IV SOLN
10.0000 meq | INTRAVENOUS | Status: AC
  Administered 2023-04-28 (×4): 10 meq via INTRAVENOUS
  Filled 2023-04-28 (×4): qty 100

## 2023-04-28 MED ORDER — FUROSEMIDE 10 MG/ML IJ SOLN
40.0000 mg | Freq: Once | INTRAMUSCULAR | Status: AC
  Administered 2023-04-28: 40 mg via INTRAVENOUS
  Filled 2023-04-28: qty 4

## 2023-04-28 NOTE — Progress Notes (Signed)
Pt with BP goal of 130-150. Pt's BP is consistently outside of parameters despite PRN Labetalol. Pt unable to take other PO medications due to mentation. This RN called e-link for additional BP coverage. PRN Hydralazine added for second line BP control and administered. This RN will continue to monitor and assess the pt and notify of any additional changes.   Pt continues to be lethargic and unable to follow commands. Pt assessment remains the same. MD notified and aware. No new orders. This RN will continue to monitor and assess the pt and notify of any additional changes.

## 2023-04-28 NOTE — Progress Notes (Signed)
Subjective: Patient was resting on exam.  I did not wake him.  No acute events overnight per nursing.  Objective: Vital signs in last 24 hours: Temp:  [100.5 F (38.1 C)-101.4 F (38.6 C)] 100.5 F (38.1 C) (12/02 0755) Pulse Rate:  [100-120] 103 (12/02 0700) Resp:  [15-30] 21 (12/02 0700) BP: (118-169)/(64-116) 153/74 (12/02 0700) SpO2:  [60 %-100 %] 100 % (12/02 0700) Weight:  [79.8 kg] 79.8 kg (12/02 0500)  Assessment/Plan: # Difficult Foley placement # Acute urinary retention  17f coud placed without difficulty. Continue Flomax Do not restart Gemtesa TOV in 5-7 days Clear yellow urine Hyponatremia improving Post 4L UOP yesterday, K+ 3.3.  Supplement per primary team Will follow peripherally  Intake/Output from previous day: 12/01 0701 - 12/02 0700 In: 56.4 [I.V.:6.4; IV Piggyback:50] Out: 3905 [Urine:3905]  Intake/Output this shift: No intake/output data recorded.  Physical Exam:  General: Sleeping CV: No cyanosis Lungs: equal chest rise Abdomen: Soft, NTND, no rebound or guarding Gu: Foley catheter in place draining clear yellow urine  Lab Results: Recent Labs    04/26/23 1248 04/27/23 0634 04/28/23 0658  HGB 10.5* 8.7* 8.4*  HCT 31.0* 25.9* 25.8*   BMET Recent Labs    04/27/23 0634 04/28/23 0658  NA 129* 132*  K 3.6 3.3*  CL 90* 89*  CO2 25 28  GLUCOSE 119* 130*  BUN 13 14  CREATININE 1.12 0.90  CALCIUM 8.9 8.7*     Studies/Results: DG CHEST PORT 1 VIEW  Result Date: 04/28/2023 CLINICAL DATA:  191478 with fever. EXAM: PORTABLE CHEST 1 VIEW COMPARISON:  Portable chest yesterday at 4:42 p.m. FINDINGS: 5:14 a.m. There is subpleural fibrosis with a basal gradient. Chronic linear scarring or atelectasis in the lower lung fields, right mid field. No focal pneumonia is evident in the setting of low lung volumes. No pleural effusion. The aorta is tortuous and calcified with stable mediastinum. Mild cardiomegaly. No evidence of CHF. No new  osseous findings. Osteopenia and Bilateral shoulder replacements. IMPRESSION: 1. Low lung volumes with subpleural fibrosis and chronic linear scarring or atelectasis. No focal pneumonia is evident in the setting of low lung volumes. 2. Aortic atherosclerosis. 3. Mild cardiomegaly. Electronically Signed   By: Almira Bar M.D.   On: 04/28/2023 06:47   CT HEAD WO CONTRAST ( )  Result Date: 04/27/2023 CLINICAL DATA:  Follow-up examination for head trauma. EXAM: CT HEAD WITHOUT CONTRAST TECHNIQUE: Contiguous axial images were obtained from the base of the skull through the vertex without intravenous contrast. RADIATION DOSE REDUCTION: This exam was performed according to the departmental dose-optimization program which includes automated exposure control, adjustment of the mA and/or kV according to patient size and/or use of iterative reconstruction technique. COMPARISON:  CT from earlier the same day. FINDINGS: Brain: Continued interval evolution of bifrontal hemorrhagic contusions, similar in size as compared to previous. Associated edema, similar. Acute subarachnoid hemorrhage involving the right cerebral hemisphere and right sylvian fissure is relatively similar as well. Extra-axial hemorrhages overlying both cerebral convexities measure up to 6 mm on the left and 5 mm on the right, similar. Trace right-to-left shift, unchanged. Small volume intraventricular hemorrhage, also unchanged. No hydrocephalus or trapping. Basilar cisterns remain patent. No new intracranial hemorrhage. No visible acute large vessel territory infarct. Underlying atrophy with chronic small vessel ischemic disease and chronic left cerebellar infarcts noted. Vascular: No visible abnormal hyperdense vessel, although vasculature partially obscured by intracranial hemorrhage. Skull: Right para median nondisplaced, nondepressed calvarial fracture again noted. Overlying scalp  contusion. Sinuses/Orbits: Globes orbital soft tissues demonstrate  no acute finding. Mild mucosal thickening about the ethmoidal air cells. Mastoid air cells and middle ear cavities remain clear. Other: None. IMPRESSION: 1. Continued interval evolution of bifrontal hemorrhagic contusions, similar in size as compared to previous. Associated edema with trace right-to-left midline shift, similar to prior. 2. Acute subarachnoid and subdural hemorrhage, not significantly changed. Trace intraventricular extension related to redistribution. No hydrocephalus. 3. No other new intracranial hemorrhage. Electronically Signed   By: Rise Mu M.D.   On: 04/27/2023 19:45   DG CHEST PORT 1 VIEW  Result Date: 04/27/2023 CLINICAL DATA:  Hypoxemia EXAM: PORTABLE CHEST 1 VIEW COMPARISON:  04/26/2023 FINDINGS: Shallow inspiration. Heart size and pulmonary vascularity are normal for technique. Linear atelectasis in the lung bases. Coarse parenchymal scarring suggested in the lungs, likely chronic. No developing consolidation or edema. No pleural effusions. No pneumothorax. Mediastinal contours appear intact. Postoperative changes in the shoulders. IMPRESSION: Shallow inspiration with atelectasis in the lung bases. Chronic scarring in the lungs. Electronically Signed   By: Burman Nieves M.D.   On: 04/27/2023 19:37   CT HEAD WO CONTRAST ( )  Result Date: 04/27/2023 CLINICAL DATA:  Subdural hematoma. EXAM: CT HEAD WITHOUT CONTRAST TECHNIQUE: Contiguous axial images were obtained from the base of the skull through the vertex without intravenous contrast. RADIATION DOSE REDUCTION: This exam was performed according to the departmental dose-optimization program which includes automated exposure control, adjustment of the mA and/or kV according to patient size and/or use of iterative reconstruction technique. COMPARISON:  Head CT from yesterday FINDINGS: Brain: Bifrontal hemorrhagic contusion with expected blooming. The greatest increases in the inferior left frontal lobe where  hemorrhagic component measures 14 mm in length, previously 5 mm. Associated increase in swelling around the multifocal frontal contusion. Subdural hematoma along the bilateral cerebral convexity without significant increase, up to 6 mm in thickness at the superior left frontal convexity. Subarachnoid hemorrhage greatest along the inferior frontal lobes and right sylvian fissure. No cerebral aneurysm on 01/18/2022 CTA. Mild leftward anterior midline shift. No entrapment or complicating infarct. There are chronic infarcts seen in the left cerebellum, bilateral occipital lobe, and right posterior frontal lobe. Vascular: Largely obscured vessels due to subarachnoid hemorrhage on the right. Skull: Nondepressed right para median frontal bone fracture reaching the right frontal sinus. Sinuses/Orbits: No detected injury. IMPRESSION: 1. Expected blooming of bifrontal hemorrhagic contusions. No significant change in subarachnoid and subdural clot. 2. No herniation or significant shift. Electronically Signed   By: Tiburcio Pea M.D.   On: 04/27/2023 05:17   DG CHEST PORT 1 VIEW  Result Date: 04/26/2023 CLINICAL DATA:  Pain after fall EXAM: PORTABLE CHEST 1 VIEW COMPARISON:  X-ray 03/10/2023 and older FINDINGS: Underinflation. Normal cardiopericardial silhouette when adjusting for level of inflation. Calcified aorta no pneumothorax or effusion. Chronic lung changes no consolidation or edema. Overlapping cardiac leads. Bilateral shoulder arthroplasties. IMPRESSION: Underinflation with chronic lung changes. Electronically Signed   By: Karen Kays M.D.   On: 04/26/2023 16:17   DG Pelvis Portable  Result Date: 04/26/2023 CLINICAL DATA:  Pain after fall EXAM: PORTABLE PELVIS 1 VIEWS COMPARISON:  None Available. FINDINGS: Right hip arthroplasty identified with Press-Fit components. The tip of the femoral component is not included in the imaging field. What is seen of the prosthesis is without hardware failure.  Chondrocalcinosis along the pubic symphysis. Slight joint space loss of the left hip. Osteopenia. No fracture or dislocation. Degenerative changes of the visualized lumbar spine at the edge  of the imaging field. Overlapping cardiac leads. Hyperostosis. Vascular calcifications. With this level of osteopenia subtle nondisplaced injury is difficult to exclude in the setting of osteopenia and if there is further concern follow up CT is recommended for further delineation IMPRESSION: Osteopenia with degenerative changes. Right hip arthroplasty. Chondrocalcinosis Electronically Signed   By: Karen Kays M.D.   On: 04/26/2023 15:26   CT CERVICAL SPINE WO CONTRAST  Result Date: 04/26/2023 CLINICAL DATA:  Poly trauma. Unwitnessed fall. The patient is on blood thinners. EXAM: CT CERVICAL SPINE WITHOUT CONTRAST TECHNIQUE: Multidetector CT imaging of the cervical spine was performed without intravenous contrast. Multiplanar CT image reconstructions were also generated. RADIATION DOSE REDUCTION: This exam was performed according to the departmental dose-optimization program which includes automated exposure control, adjustment of the mA and/or kV according to patient size and/or use of iterative reconstruction technique. COMPARISON:  None Available. FINDINGS: Alignment: Grade 1 degenerative anterolisthesis present at C4-5. Slight degenerative anterolisthesis is present at C7-T1. No other significant listhesis is present. Straightening of the normal cervical lordosis is present. Skull base and vertebrae: The craniocervical junction is normal. Vertebral body heights are maintained. No acute fractures are present. Degenerative endplate sclerotic changes are present C5-6 and C6-7. Soft tissues and spinal canal: No prevertebral fluid or swelling. No visible canal hematoma. Disc levels: Multilevel degenerative changes are present throughout the cervical spine. Uncovertebral and facet hypertrophy lead to moderate to severe right  foraminal stenosis at C4-5. Right greater than left foraminal narrowing is present at C5-6 and C6-7. Upper chest: The lung apices are not imaged. IMPRESSION: 1. No acute fracture or traumatic subluxation. 2. Multilevel degenerative changes of the cervical spine as described. Electronically Signed   By: Marin Roberts M.D.   On: 04/26/2023 13:24   CT HEAD WO CONTRAST  Result Date: 04/26/2023 CLINICAL DATA:  Unwitnessed fall. Altered mental status. The patient takes Plavix for a DVT. EXAM: CT HEAD WITHOUT CONTRAST TECHNIQUE: Contiguous axial images were obtained from the base of the skull through the vertex without intravenous contrast. RADIATION DOSE REDUCTION: This exam was performed according to the departmental dose-optimization program which includes automated exposure control, adjustment of the mA and/or kV according to patient size and/or use of iterative reconstruction technique. COMPARISON:  CT head without contrast 09/18/2021 FINDINGS: Brain: Extensive hemorrhagic contusion is present in the anterior inferior frontal lobes, right greater than left. Diffuse subarachnoid hemorrhage present in the right sylvian fissure and right greater than left suprasellar cisterns. Bilateral subdural hematomas are present measuring 5 mm on the left and 4 mm on the right. Diffuse subcortical and periventricular white matter hypoattenuation is similar the prior study. Remote left cerebellar infarcts are again noted. Remote occipital lobe infarcts are stable. Vascular: No hyperdense vessel or unexpected calcification. Skull: A nondisplaced right paramedian frontal skull fracture extends to the coronal suture. Associated right supraorbital scalp hematoma is present. No radiopaque foreign body or pneumocephalus is present. Sinuses/Orbits: The paranasal sinuses and mastoid air cells are clear. Bilateral lens replacements are present. Globes and orbits are otherwise within normal limits. IMPRESSION: 1. Extensive  hemorrhagic contusion in the anterior inferior frontal lobes, right greater than left. 2. Diffuse subarachnoid hemorrhage in the right Sylvian fissure and right greater than left suprasellar cisterns. 3. Bilateral subdural hematomas measuring 5 mm on the left and 4 mm on the right. 4. Nondisplaced right paramedian frontal skull fracture extends to the coronal suture. Associated right supraorbital scalp hematoma is present. 5. Stable appearance of diffuse subcortical and periventricular white  matter disease. This likely reflects the sequela of chronic microvascular ischemia. 6. 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 Critical Value/emergent results were called by telephone at the time of interpretation on 04/26/2023 at 1:17 pm to provider ANDREW TEE , who verbally acknowledged these results. Electronically Signed   By: Marin Roberts M.D.   On: 04/26/2023 13:20      LOS: 2 days   Elmon Kirschner, NP [facility] Pager: [phone]  04/28/2023, 11:43 AM

## 2023-04-28 NOTE — Progress Notes (Signed)
SLP Cancellation Note  Patient Details Name: Kenneth Montoya MRN: [id number] DOB: Dec 09, 1940   Cancelled treatment:       Reason Eval/Treat Not Completed: Fatigue/lethargy limiting ability to participate- palliative care consult pending. Will follow for GOC. D/W RN.  [name] Frederic, MA CCC/SLP Clinical Specialist - Acute Care SLP Acute Rehabilitation Services Office number [phone]    [name] 04/28/2023, 12:39 PM

## 2023-04-28 NOTE — Progress Notes (Signed)
NAME:  Kenneth Montoya, MRN:  [id number], DOB:  1941/04/13, LOS: 2 ADMISSION DATE:  04/26/2023, CONSULTATION DATE:  11/30 REFERRING MD:  Tee-EDP, CHIEF COMPLAINT:  traumatic SAH   History of Present Illness:  [name] is an 82 y/o gentleman with a history of mild cognitive decline, recent THA in 01/2023, wound infection and likely septic arthritis after a fall causing injury to his incision in Oct 2024 who presented with confusion after a fall this morning. He had an unwitnessed fall around 5:45 this morning at his rehab, and family visiting around 11AM felt that he was not acting normally and noticed the injury on the back of his head. They had him brought to the ED where he was found to have a SAH. He fell about a week ago as well, but no known injuries from that fall. He has previously been on plavix. He had a headache last night per his daughter, but otherwise has not had complaints. He is now more confused than his baseline per daughter. He completed his planned IV course of cefazolin on 11/28 and had his PICC removed on 11/29. He was started on cefadroxil suppressive therapy as planned after completion of his IV course of antibiotics. His family had been working on a safe discharge plan for him this week with some difficulty-- their mother has cognitive impairment and his daughter and the SW do not feel that [name] could safely be at home caring for himself. He has felt pretty strongly that he wants to go home and told his daughter he does not ever want to live in a facility.   Pertinent  Medical History  DVT Stroke SIADH with chronic hyponatrremia HTN GERD BPH Recent MSSA bacteremia, SSI with likely joint involvement (Oct 2024) R THA with recent possible periprosthetic fracture Obesity CAD  Significant Hospital Events: Including procedures, antibiotic start and stop dates in addition to other pertinent events   11/30 admitted  Interim History / Subjective:  Patient is more  lethargic, not able to open eyes Started spiking fever with Tmax 101.1 Repeat head CT showed evolution of frontal contusion, persistent subarachnoid hemorrhage and subdural hemorrhage bilaterally  Objective   Blood pressure (!) 153/74, pulse (!) 103, temperature (!) 100.5 F (38.1 C), temperature source Axillary, resp. rate (!) 21, height 5\' 5"  (1.651 m), weight 79.8 kg, SpO2 100%.        Intake/Output Summary (Last 24 hours) at 04/28/2023 4098 Last data filed at 04/28/2023 0700 Gross per 24 hour  Intake 54.42 ml  Output 3905 ml  Net -3850.58 ml   Filed Weights   04/26/23 1230 04/27/23 0500 04/28/23 0500  Weight: 90.7 kg 87.8 kg 79.8 kg    Examination: General: Crtitically ill-appearing elderly male, lying in the bed HEENT: [place]/AT, eyes anicteric.  Moist mucous membranes Neuro: Eyes closed, does not open, not following commands, pupils 3 mm bilaterally reactive to light, positive gag and cough,  Chest: Coarse breath sounds, no wheezes or rhonchi Heart: Regular rate and rhythm, no murmurs or gallops Abdomen: Soft, nondistended, bowel sounds present Skin: Stage I decubitus ulcer noted on sacrum, POA  Labs and images reviewed  Resolved Hospital Problem list     Assessment & Plan:  Traumatic SAH status post fall Traumatic bifrontal contusion Traumatic bilateral subdural hemorrhage Continue to watch every hour Appreciate neurosurgery follow-up, recommend watchful waiting, no plans for surgery Repeat head CT yesterday showing evolving bifrontal contusion, subarachnoid hemorrhage and subdural hemorrhage Maintain SBP 130-150 Off clevidipine infusion  Continue as needed labetalol  Acute encephalopathy in the setting of traumatic brain injury Patient is getting more more lethargic He is DNR/DNI Avoid sedation  Hypertension: Patient is off clevidipine now At home he is on amlodipine and Coreg Unable to take p.o. meds, will ask family about cotrak placement  Frequent falls-  past week PT/OT evaluation  Recent septic arthritis of prosthetic hip Finished course of cefazolin this week, con't cefadroxil as prescribed Continue as needed Tylenol  Chronic hyponatremia due to SIADH: Na levels stable Stop salt tablets Monitor intake and output  Anemia of chronic disease Leukocytosis-possibly reactive Closely monitor H&H and WBC count Transfuse if hemoglobin less than 7  Acute urinary retention due to BPH: Complicated Foley placement requiring urology assistance 12/1 Continue Flomax Could continue Foley, trial of void in 5 to 7 days  GERD Continue pepcid & PPI  Chronic HFrEF Continue gentle diuresis Monitor intake and output  Insomnia Continue melatonin, hold trazodone  Stage I decubitus ulcer noted on sacrum, POA Continue wound care   Best Practice (right click and "Reselect all SmartList Selections" daily)   Diet/type: NPO DVT prophylaxis: SCDs Pressure ulcer(s): present on admission - sacral stage 1 GI prophylaxis: H2B Lines: N/A Foley:  Yes, and it is still needed Code Status: DNR/DNI Last date of multidisciplinary goals of care discussion: 12/2: Patient's daughter was updated at bedside  Labs   CBC: Recent Labs  Lab 04/26/23 1230 04/26/23 1248 04/27/23 0634 04/28/23 0658  WBC 16.2*  --  16.0* 15.7*  HGB 9.8* 10.5* 8.7* 8.4*  HCT 30.5* 31.0* 25.9* 25.8*  MCV 83.3  --  81.7 81.1  PLT 323  --  304 281    Basic Metabolic Panel: Recent Labs  Lab 04/26/23 1230 04/26/23 1248 04/27/23 0634 04/28/23 0658  NA 126* 126* 129* 132*  K 3.7 3.8 3.6 3.3*  CL 90* 90* 90* 89*  CO2 22  --  25 28  GLUCOSE 142* 146* 119* 130*  BUN 13 13 13 14   CREATININE 1.10 0.90 1.12 0.90  CALCIUM 8.6*  --  8.9 8.7*   GFR: Estimated Creatinine Clearance: 60.5 mL/min (by C-G formula based on SCr of 0.9 mg/dL). Recent Labs  Lab 04/26/23 1230 04/26/23 1249 04/27/23 0634 04/28/23 0658  WBC 16.2*  --  16.0* 15.7*  LATICACIDVEN  --  1.1  --   --      Liver Function Tests: Recent Labs  Lab 04/26/23 1230  AST 28  ALT 12  ALKPHOS 106  BILITOT 0.8  PROT 6.9  ALBUMIN 2.9*   No results for input(s): "LIPASE", "AMYLASE" in the last 168 hours. No results for input(s): "AMMONIA" in the last 168 hours.  ABG    Component Value Date/Time   TCO2 27 04/26/2023 1248     Coagulation Profile: Recent Labs  Lab 04/26/23 1230  INR 1.1    Cardiac Enzymes: No results for input(s): "CKTOTAL", "CKMB", "CKMBINDEX", "TROPONINI" in the last 168 hours.  HbA1C: Hgb A1c MFr Bld  Date/Time Value Ref Range Status  04/26/2023 04:41 PM 5.9 (H) 4.8 - 5.6 % Final    Comment:    (NOTE) Pre diabetes:          5.7%-6.4%  Diabetes:              >6.4%  Glycemic control for   <7.0% adults with diabetes   01/18/2022 08:39 PM 5.4 4.8 - 5.6 % Final    Comment:    (NOTE) Pre diabetes:  5.7%-6.4%  Diabetes:              >6.4%  Glycemic control for   <7.0% adults with diabetes     CBG: Recent Labs  Lab 04/27/23 1540 04/27/23 1938 04/27/23 2321 04/28/23 0326 04/28/23 0716  GLUCAP 115* 135* 135* 125* 124*   The patient is critically ill due to traumatic subdural/subarachnoid/bilateral frontal contusion.  Critical care was necessary to treat or prevent imminent or life-threatening deterioration.  Critical care was time spent personally by me on the following activities: development of treatment plan with patient and/or surrogate as well as nursing, discussions with consultants, evaluation of patient's response to treatment, examination of patient, obtaining history from patient or surrogate, ordering and performing treatments and interventions, ordering and review of laboratory studies, ordering and review of radiographic studies, pulse oximetry, re-evaluation of patient's condition and participation in multidisciplinary rounds.   During this encounter critical care time was devoted to patient care services described in this note  for 38 minutes.     Cheri Fowler, MD [facility] See Amion for pager If no response to pager, please call [phone] until 7pm After 7pm, Please call E-link [phone]

## 2023-04-28 NOTE — Progress Notes (Signed)
OT Cancellation Note  Patient Details Name: GRAYLIN CALANDRINO MRN: [id number] DOB: 1941/03/14   Cancelled Treatment:    Reason Eval/Treat Not Completed: Fatigue/lethargy limiting ability to participate Spoke with RN this afternoon. OT eval held today as pt continues to be lethargic. Palliative consult pending. Will follow up tomorrow and complete OT eval if appropriate.   Limmie Patricia, OTR/L,CBIS  Supplemental OT - [facility] Secure Chat Preferred   04/28/2023, 3:27 PM

## 2023-04-28 NOTE — Progress Notes (Signed)
eLink Physician-Brief Progress Note Patient Name: Kenneth Montoya DOB: 01-Jul-1940 MRN: [id number]   Date of Service  04/28/2023  HPI/Events of Note  Patient w/ SAH.  BP goal is 130-150. He has PRN labetaol but he had it at 1820 and he is 168/76 right now. Asking for additional coverage for the BP.  NPO d/t mentation and has no enteral access.   eICU Interventions  Add hydralazine for 2nd line  BP control   0511 -patient was being moved in bed and suddenly developed minute long seizure.  The patient was placed on his side, concerned that he has been aspirating small amounts throughout the evening and now requiring NT suctioning.  Currently encephalopathic and unable to assess for postictal state.  Will add on Keppra load and infusions.  Ongoing goals of care.  No clear intervention available with additional CT imaging.  8768 -patient had an additional seizure which lasted 1 to 2 minutes.  This happened again with suctioning and was complicated by tongue biting and ongoing oral bleeding.  Based off the discussions noted in the chart, I called the patient's daughter [name] and discussed the clinical decline.  She insisted that her goal is to keep bill as comfortable as possible.  I discussed noted transition to comfort measures only would entail including the discontinuation of medications that are not comfort oriented and the initiation of anxiolytics, analgesics, and additional medications as necessary for symptomatic management.  She states that this is what she wanted to discuss with palliative care today and would be amenable to transitioning when the family is at bedside.  Her mother is at a local care facility and [name] is hoping to bring her to bedside also.  I requested a ground team evaluation for oral bleeding to see if the patient may benefit from oral packing or alternative measures to help with tongue bleeding and secretion management.  1157 -now hypotensive.  Awaiting bedside family meeting.   Will initiate normal saline bolus and vasopressors as needed until the conversation can take place.  Intervention Category Intermediate Interventions: Hypertension - evaluation and management  Kenneth Montoya 04/28/2023, 9:28 PM

## 2023-04-28 NOTE — TOC CM/SW Note (Signed)
Transition of Care [facility]) - Inpatient Brief Assessment   Patient Details  Name: Kenneth Montoya MRN: [id number] Date of Birth: [birth year]  Transition of Care [facility]) CM/SW Contact:    Mearl Latin, LCSW Phone Number: 04/28/2023, 8:56 AM   Clinical Narrative: Patient admitted from [facility] with a fall. CSW following for needs as medical workup is underway.   Transition of Care Asessment: Insurance and Status: Insurance coverage has been reviewed Patient has primary care physician: Yes Home environment has been reviewed: From home Prior level of function:: Needs Assist Prior/Current Home Services: No current home services (SNF) Social Determinants of Health Reivew: SDOH reviewed no interventions necessary Readmission risk has been reviewed: Yes Transition of care needs: transition of care needs identified, TOC will continue to follow

## 2023-04-28 NOTE — Progress Notes (Signed)
Patient ID: Kenneth Montoya, male   DOB: 06/14/1940, 82 y.o.   MRN: [id number] Patient remains confused encephalopathic and lethargic  No other focal deficits however very confused lethargic and difficulty controlling secretions.  Continue aggressive pulmonary toilet patient is DNR and DNI DNI discussed with the family extensively I would not recommend surgery at any point.  I do think it will be very difficult and patient's prognosis is very poor to control secretions and prevent pneumonia or aspiration.  Did have these discussions with the family and agreed to try to get his wife to visit from [place].

## 2023-04-29 DIAGNOSIS — S0636AA Traumatic hemorrhage of cerebrum, unspecified, with loss of consciousness status unknown, initial encounter: Secondary | ICD-10-CM

## 2023-04-29 DIAGNOSIS — Z7189 Other specified counseling: Secondary | ICD-10-CM | POA: Diagnosis not present

## 2023-04-29 DIAGNOSIS — I609 Nontraumatic subarachnoid hemorrhage, unspecified: Secondary | ICD-10-CM | POA: Diagnosis not present

## 2023-04-29 DIAGNOSIS — Z66 Do not resuscitate: Secondary | ICD-10-CM

## 2023-04-29 DIAGNOSIS — Z515 Encounter for palliative care: Secondary | ICD-10-CM | POA: Diagnosis not present

## 2023-04-29 LAB — GLUCOSE, CAPILLARY
Glucose-Capillary: 125 mg/dL — ABNORMAL HIGH (ref 70–99)
Glucose-Capillary: 108 mg/dL — ABNORMAL HIGH (ref 70–99)

## 2023-04-29 MED ORDER — NOREPINEPHRINE 4 MG/250ML-% IV SOLN
2.0000 ug/min | INTRAVENOUS | Status: DC
Start: 2023-04-29 — End: 2023-04-29

## 2023-04-29 MED ORDER — LORAZEPAM 2 MG/ML IJ SOLN
2.0000 mg | INTRAMUSCULAR | Status: DC | PRN
  Administered 2023-04-29: 2 mg via INTRAVENOUS

## 2023-04-29 MED ORDER — SODIUM CHLORIDE 0.9 % IV SOLN
2500.0000 mg | Freq: Once | INTRAVENOUS | Status: AC
  Administered 2023-04-29: 2500 mg via INTRAVENOUS
  Filled 2023-04-29: qty 25

## 2023-04-29 MED ORDER — GLYCOPYRROLATE 0.2 MG/ML IJ SOLN
0.2000 mg | INTRAMUSCULAR | Status: DC | PRN
  Administered 2023-05-04 – 2023-05-06 (×2): 0.2 mg via INTRAVENOUS
  Filled 2023-04-29 (×3): qty 1

## 2023-04-29 MED ORDER — SODIUM CHLORIDE 0.9 % IV SOLN
250.0000 mL | INTRAVENOUS | Status: AC
  Administered 2023-04-29: 250 mL via INTRAVENOUS

## 2023-04-29 MED ORDER — MORPHINE SULFATE (PF) 2 MG/ML IV SOLN
1.0000 mg | INTRAVENOUS | Status: DC | PRN
  Administered 2023-04-30 – 2023-05-06 (×12): 1 mg via INTRAVENOUS
  Filled 2023-04-29 (×13): qty 1

## 2023-04-29 MED ORDER — LEVETIRACETAM IN NACL 1000 MG/100ML IV SOLN
1000.0000 mg | Freq: Two times a day (BID) | INTRAVENOUS | Status: DC
  Administered 2023-04-29 – 2023-05-05 (×12): 1000 mg via INTRAVENOUS
  Filled 2023-04-29 (×12): qty 100

## 2023-04-29 MED ORDER — LORAZEPAM 2 MG/ML IJ SOLN
2.0000 mg | INTRAMUSCULAR | Status: AC | PRN
Start: 2023-04-29 — End: 2023-05-06
  Administered 2023-05-06: 2 mg via INTRAVENOUS
  Filled 2023-04-29: qty 1

## 2023-04-29 MED ORDER — GLYCOPYRROLATE 0.2 MG/ML IJ SOLN
0.2000 mg | Freq: Three times a day (TID) | INTRAMUSCULAR | Status: DC
  Administered 2023-04-29: 0.2 mg via INTRAVENOUS
  Filled 2023-04-29: qty 1

## 2023-04-29 MED ORDER — LORAZEPAM 2 MG/ML IJ SOLN
INTRAMUSCULAR | Status: AC
  Filled 2023-04-29: qty 1

## 2023-04-29 MED ORDER — SODIUM CHLORIDE 0.9 % IV BOLUS
500.0000 mL | Freq: Once | INTRAVENOUS | Status: AC
  Administered 2023-04-29: 500 mL via INTRAVENOUS

## 2023-04-29 NOTE — Progress Notes (Signed)
Seen by palliative earlier today. Appears comfortable  Plan Cont current plan of care w/ focus on comfort.  Can move out of ICU   Simonne Martinet ACNP-BC [facility]monary/Critical Care Pager # [phone] OR # [phone] if no answer

## 2023-04-29 NOTE — Progress Notes (Signed)
0500 - While cleaning pt with NT, pt began seizing at 0506. This RN and NT positioned pt on his side to maintain his airway and contacted e-link. Seizing stopped at 0507. E-link ordered keppra and CCM en route to assess pt. This RN remained with pt. Pt stable and repositioned.   0550 - Pt began to seize again at 0553. This RN positioned pt on his side. E-link was contacted. Seizing stopped at 0557. E-link on camera. Ativan given. Pt bit a portion of his tongue during this second seizure. Pt's mouth was bleeding. RT placed bite block. RT was able to suction secretions. CCM MD at bedside. Confirmed course of treatment and agreed to call family.   0645 - Pt's BP was hypotensive. E-link contacted. NS bolus ordered and administered. This RN will continue to monitor and assess the pt and notify of any additional changes.

## 2023-04-29 NOTE — Progress Notes (Signed)
This chaplain responded to PMT NP-Eric consult for family support, prayer, and story telling. The chaplain listened reflectively as the Pt. wife, daughter, and granddaughter told their favorite stories of the Pt.   The chaplain understands grief care for the family includes God's love and the promise of eternal life.  This chaplain is available for F/U spiritual care as needed.  Theodosia Paling [phone]

## 2023-04-29 NOTE — Progress Notes (Signed)
OT Cancellation Note  Patient Details Name: Kenneth Montoya MRN: [id number] DOB: 1940-08-06   Cancelled Treatment:    Reason Eval/Treat Not Completed: Other (comment) Pt has been transitioned to comfort care. OT will sign off.   Limmie Patricia, OTR/L,CBIS  Supplemental OT - [facility] Secure Chat Preferred   04/29/2023, 8:17 AM

## 2023-04-29 NOTE — Progress Notes (Signed)
Pt NTS x2. Small amount of withe thick secretions. Pt tolerated well.

## 2023-04-29 NOTE — Progress Notes (Signed)
NAME:  Kenneth Montoya, MRN:  [id number], DOB:  [birth year], LOS: 3 ADMISSION DATE:  04/26/2023, CONSULTATION DATE:  11/30 REFERRING MD:  Tee-EDP, CHIEF COMPLAINT:  traumatic SAH   History of Present Illness:  Kenneth Montoya is an 82 y/o gentleman with a history of mild cognitive decline, recent THA in 01/2023, wound infection and likely septic arthritis after a fall causing injury to his incision in Oct 2024 who presented with confusion after a fall this morning. He had an unwitnessed fall around 5:45 this morning at his rehab, and family visiting around 11AM felt that he was not acting normally and noticed the injury on the back of his head. They had him brought to the ED where he was found to have a SAH. He fell about a week ago as well, but no known injuries from that fall. He has previously been on plavix. He had a headache last night per his daughter, but otherwise has not had complaints. He is now more confused than his baseline per daughter. He completed his planned IV course of cefazolin on 11/28 and had his PICC removed on 11/29. He was started on cefadroxil suppressive therapy as planned after completion of his IV course of antibiotics. His family had been working on a safe discharge plan for him this week with some difficulty-- their mother has cognitive impairment and his daughter and the SW do not feel that Kenneth Montoya could safely be at home caring for himself. He has felt pretty strongly that he wants to go home and told his daughter he does not ever want to live in a facility.   Pertinent  Medical History  DVT Stroke SIADH with chronic hyponatrremia HTN GERD BPH Recent MSSA bacteremia, SSI with likely joint involvement (Oct 2024) R THA with recent possible periprosthetic fracture Obesity CAD  Significant Hospital Events: Including procedures, antibiotic start and stop dates in addition to other pertinent events   11/30 admitted 12/2 more lethargy febrile. CT showing evolution of  frontal contusion  12/3 more lethargic. Seizures. Bit tongue, aspirated on blood. Spoke w/ daughter Kenneth Montoya. (See note from 12/3) transitioning to full comfort.   Interim History / Subjective:  Patient is more lethargic, not able to open eyes Started spiking fever with Tmax 101.1 Repeat head CT showed evolution of frontal contusion, persistent subarachnoid hemorrhage and subdural hemorrhage bilaterally  Objective   Blood pressure 110/65, pulse (!) 107, temperature (!) 100.6 F (38.1 C), temperature source Axillary, resp. rate 19, height 5\' 5"  (1.651 m), weight 74.6 kg, SpO2 98%.        Intake/Output Summary (Last 24 hours) at 04/29/2023 0719 Last data filed at 04/29/2023 0700 Gross per 24 hour  Intake 844.49 ml  Output 2945 ml  Net -2100.51 ml   Filed Weights   04/27/23 0500 04/28/23 0500 04/29/23 0500  Weight: 87.8 kg 79.8 kg 74.6 kg    Examination: General 82 year old male now minimally responsive HENT Pupils pinpoint. MM dry some dry bloody secretions Pulm faint wheeze. No accessory use. Sonorus respiratory pattern Card Reg irreg now in AF w/ RVR Ext pitting LE edema. Warm pulses are palp Neuro minimally responsive. Decorticate posturing to stimulus  Gu cl yellow   Resolved Hospital Problem list   Frequent falls- past week Hypertension  Insomnia  Assessment & Plan:  Traumatic SAH status post fall Traumatic bifrontal contusion Traumatic bilateral subdural hemorrhage: Seen by N-surg. Not candidate for surgery, CT imaging showing evolution of hemorrhage  Seizure: new onset (witnessed  X 2)  Acute encephalopathy in the setting of traumatic brain injury Patient is getting more lethargic. Suspect mixed picture of evolution of hemorrhage and prob post-ictal state  Hypotension: Suspect SIRS response from aspiration  Recent septic arthritis of prosthetic hip Chronic hyponatremia due to SIADH: Na levels stable Anemia of chronic disease Acute urinary retention due to BPH:  Complicated Foley placement requiring urology assistance 12/1 GERD Chronic HFrEF Insomnia Stage I decubitus ulcer noted on sacrum, POA DNR status COMFORT CARE  Discussion  Has had sig clinical decline d/t evolution of his frontal contusion and this has been complicated further by new onset seizures, biting tongue w/ oral bleeding and subsequent aspiration and SIRS response. I spoke at length w/ his daughter Kenneth Montoya at bedside. She had already shared during the evening that her priority for her father was comfort. Given his clear clinical deterioration, underlying poor health and values shared by Kenneth Montoya we will go ahead and transition to comfort care.   Plan DC all labs DC all meds that do not contribute to comfort Cont supplemental oxygen via [place] (no escalation past this) Nasal Trumpet PRN if nasal tracheal suctioning needed Remove oral bite block Cont Keppra  PRN ativan for break thru seizure Add PRN morphine at low dose but certainly can increase.  Will see how he does symptom wise and anticipate moving to private medsurg bed later today as long as symptoms controlled.      Best Practice (right click and "Reselect all SmartList Selections" daily)   Diet/type: NPO DVT prophylaxis: SCDs Pressure ulcer(s): present on admission - sacral stage 1 GI prophylaxis: N/A Lines: N/A Foley:  Yes, and it is still needed Code Status: DNR/DNI  Last date of multidisciplinary goals of care discussion: 12/3 transition to full comfort care   My time 35 min  Simonne Martinet ACNP-BC [facility] Pulmonary/Critical Care Pager # [phone] OR # [phone] if no answer

## 2023-04-29 NOTE — Plan of Care (Signed)
  Problem: Education: Goal: Knowledge of General Education information will improve Description: Including pain rating scale, medication(s)/side effects and non-pharmacologic comfort measures Outcome: Progressing   Problem: Health Behavior/Discharge Planning: Goal: Ability to manage health-related needs will improve Outcome: Progressing   Problem: Clinical Measurements: Goal: Will remain free from infection Outcome: Progressing   

## 2023-04-29 NOTE — Consult Note (Signed)
Palliative Care Consult Note                                  Date: 04/29/2023   Patient Name: Kenneth Montoya  DOB: 1941/03/05  MRN: [id number]  Age / Sex: 82 y.o., male  PCP: Etta Grandchild, MD Referring Physician: Cheri Fowler, MD  Reason for Consultation: {Reason for Consult:23484}  HPI/Patient Profile: 82 y.o. male  with past medical history of *** admitted on 04/26/2023 with ***.   Past Medical History:  Diagnosis Date   Anxiety    Arthritis    generalized   BPH (benign prostatic hyperplasia)    Cancer of skin, squamous cell    frozen    Cataract    bilateral sx   GERD (gastroesophageal reflux disease)    on meds   Hypertension    on meds   Shingles    SIADH (syndrome of inappropriate ADH production) (HCC)    Stroke (HCC)     Subjective:   This NP Wynne Dust reviewed medical records, received report from team, assessed the patient and then meet at the patient's bedside to discuss diagnosis, prognosis, GOC, EOL wishes disposition and options.  I met with ***.   We meet to discuss diagnosis prognosis, GOC, EOL wishes, disposition and options. Concept of Palliative Care was introduced as specialized medical care for people and their families living with serious illness.  If focuses on providing relief from the symptoms and stress of a serious illness.  The goal is to improve quality of life for both the patient and the family. Values and goals of care important to patient and family were attempted to be elicited.  ***  Created space and opportunity for patient  and family to explore thoughts and feelings regarding current medical situation   Natural trajectory and current clinical status were discussed. Questions and concerns addressed. Patient  encouraged to call with questions or concerns.    Patient/Family Understanding of Illness: ***  Life Review: ***  Patient Values: ***  Goals: ***  Today's  Discussion: ***  Review of Systems  Objective:   Primary Diagnoses: Present on Admission: **None**   Physical Exam  Vital Signs:  BP 110/71   Pulse 98   Temp 99.8 F (37.7 C) (Axillary)   Resp 18   Ht 5\' 5"  (1.651 m)   Wt 74.6 kg   SpO2 92%   BMI 27.37 kg/m   Palliative Assessment/Data: ***    Advanced Care Planning:   Existing Vynca/ACP Documentation: ***  Primary Decision Maker: {Primary Decision WUJWJ:19147}  Code Status/Advance Care Planning: {Palliative Code status:23503}  A discussion was had today regarding advanced directives. Concepts specific to code status, artifical feeding and hydration, continued IV antibiotics and rehospitalization was had.  The difference between a aggressive medical intervention path and a palliative comfort care path for this patient at this time was had. ***The MOST form was introduced and discussed.***  Decisions/Changes to ACP: ***  Assessment & Plan:   Impression: ***  SUMMARY OF RECOMMENDATIONS   ***  Symptom Management:  ***  Prognosis:  {Palliative Care Prognosis:23504}  Discharge Planning:  {Palliative dispostion:23505}   Discussed with: ***    Thank you for allowing [name] to participate in the care of [name] PMT will continue to support holistically.  Time Total: ***  Detailed review of medical records (labs, imaging, vital signs), medically appropriate exam, discussed  with treatment team, counseling and education to patient, family, & staff, documenting clinical information, medication management, coordination of care  Signed by: Wynne Dust, NP Palliative Medicine Team  Team Phone # [phone] (Nights/Weekends)  04/29/2023, 10:17 AM

## 2023-04-29 NOTE — Progress Notes (Signed)
Patient being transitioned to comfort care. Will sign off at this time.

## 2023-04-30 ENCOUNTER — Encounter (HOSPITAL_COMMUNITY): Payer: Self-pay | Admitting: Critical Care Medicine

## 2023-04-30 DIAGNOSIS — Z7189 Other specified counseling: Secondary | ICD-10-CM | POA: Diagnosis not present

## 2023-04-30 DIAGNOSIS — S0636AA Traumatic hemorrhage of cerebrum, unspecified, with loss of consciousness status unknown, initial encounter: Secondary | ICD-10-CM | POA: Diagnosis not present

## 2023-04-30 DIAGNOSIS — Z515 Encounter for palliative care: Secondary | ICD-10-CM | POA: Diagnosis not present

## 2023-04-30 DIAGNOSIS — I609 Nontraumatic subarachnoid hemorrhage, unspecified: Secondary | ICD-10-CM | POA: Diagnosis not present

## 2023-04-30 NOTE — Plan of Care (Signed)
  Problem: Pain Management: Goal: General experience of comfort will improve Outcome: Progressing   Problem: Safety: Goal: Ability to remain free from injury will improve Outcome: Progressing

## 2023-04-30 NOTE — Progress Notes (Signed)
12/4 Patient under Comfort Measures, IMM Letter respectfully not given.

## 2023-04-30 NOTE — Progress Notes (Addendum)
NAME:  Kenneth Montoya, MRN:  [id number], DOB:  [birth year], LOS: 4 ADMISSION DATE:  04/26/2023, CONSULTATION DATE:  11/30 REFERRING MD:  Tee-EDP, CHIEF COMPLAINT:  traumatic SAH   History of Present Illness:  [name] is an 82 y/o gentleman with a history of mild cognitive decline, recent THA in 01/2023, wound infection and likely septic arthritis after a fall causing injury to his incision in Oct 2024 who presented with confusion after a fall this morning. He had an unwitnessed fall around 5:45 this morning at his rehab, and family visiting around 11AM felt that he was not acting normally and noticed the injury on the back of his head. They had him brought to the ED where he was found to have a SAH. He fell about a week ago as well, but no known injuries from that fall. He has previously been on plavix. He had a headache last night per his daughter, but otherwise has not had complaints. He is now more confused than his baseline per daughter. He completed his planned IV course of cefazolin on 11/28 and had his PICC removed on 11/29. He was started on cefadroxil suppressive therapy as planned after completion of his IV course of antibiotics. His family had been working on a safe discharge plan for him this week with some difficulty-- their mother has cognitive impairment and his daughter and the SW do not feel that [name] could safely be at home caring for himself. He has felt pretty strongly that he wants to go home and told his daughter he does not ever want to live in a facility.   Pertinent  Medical History  DVT Stroke SIADH with chronic hyponatrremia HTN GERD BPH Recent MSSA bacteremia, SSI with likely joint involvement (Oct 2024) R THA with recent possible periprosthetic fracture Obesity CAD  Significant Hospital Events: Including procedures, antibiotic start and stop dates in addition to other pertinent events   11/30 admitted 12/2 more lethargy febrile. CT showing evolution of  frontal contusion  12/3 more lethargic. Seizures. Bit tongue, aspirated on blood. Spoke w/ daughter [name]. (See note from 12/3) transitioning to full comfort.   Interim History / Subjective:  Patient is more lethargic, not able to open eyes Started spiking fever with Tmax 101.1 Repeat head CT showed evolution of frontal contusion, persistent subarachnoid hemorrhage and subdural hemorrhage bilaterally  Objective   Blood pressure (!) 149/77, pulse 98, temperature 98.5 F (36.9 C), temperature source Oral, resp. rate 17, height 5\' 5"  (1.651 m), weight 74.6 kg, SpO2 94%.        Intake/Output Summary (Last 24 hours) at 04/30/2023 1100 Last data filed at 04/30/2023 0900 Gross per 24 hour  Intake 213.23 ml  Output 340 ml  Net -126.77 ml   Filed Weights   04/28/23 0500 04/29/23 0500 04/30/23 0704  Weight: 79.8 kg 74.6 kg 74.6 kg    Examination: General resting comfortably  Hent NCAT  Pulm sonorus respiratory pattern Card rrr Abd soft Neuro eyes open moving spont but looks comfortable   Resolved Hospital Problem list   Frequent falls- past week Hypertension  Insomnia  Assessment & Plan:  Traumatic SAH status post fall Traumatic bifrontal contusion Traumatic bilateral subdural hemorrhage Seizure Acute encephalopathy in the setting of traumatic brain injury Hypotension: Recent septic arthritis of prosthetic hip Chronic hyponatremia due to SIADH: Na levels stable Anemia of chronic disease Acute urinary retention due to BPH: Complicated Foley placement requiring urology assistance 12/1 GERD Chronic HFrEF Insomnia Stage I decubitus  ulcer noted on sacrum, POA DNR status COMFORT CARE  Discussion  Has had sig clinical decline d/t evolution of his frontal contusion and this has been complicated further by new onset seizures, biting tongue w/ oral bleeding and subsequent aspiration and SIRS response. Made comfort care 12/3.  A little more awake today. Suspect yesterday made worse  by post-ictal state, but overall he remains comfortable. Has required 2 dose of morphine since transfer to [facility],. Our goal continues to be comfort care. Palliative also following   Plan Nasal Trumpet PRN if nasal tracheal suctioning needed Cont Keppra  PRN ativan for break thru seizure PRN morphine at low dose but certainly can increase.  Anticipate in hospital death     Best Practice (right click and "Reselect all SmartList Selections" daily)   Diet/type: NPO DVT prophylaxis: SCDs Pressure ulcer(s): present on admission - sacral stage 1 GI prophylaxis: N/A Lines: N/A Foley:  Yes, and it is still needed Code Status: DNR/DNI  Last date of multidisciplinary goals of care discussion: 12/3 transition to full comfort care   My time 25 min  Simonne Martinet ACNP-BC [facility] Pulmonary/Critical Care Pager # [phone] OR # [phone] if no answer

## 2023-04-30 NOTE — Progress Notes (Signed)
Daily Progress Note   Patient Name: Kenneth Montoya       Date: 04/30/2023 DOB: 1941-04-29  Age: 82 y.o. MRN#: [id number] Attending Physician: Cheri Fowler, MD Primary Care Physician: Etta Grandchild, MD Admit Date: 04/26/2023 Length of Stay: 4 days  Reason for Consultation/Follow-up: Establishing goals of care and Terminal Care  HPI/Patient Profile:  82 y.o. male  with past medical history of mild cognitive decline, recent THA in 01/2023, wound infection and likely septic arthritis after a fall causing injury to his incision in Oct 2024 who presented with confusion after a fall this morning.  admitted on 04/26/2023 with traumatic SAH after fall traumatic bilateral subdural hemorrhage, new onset seizure, acute encephalopathy in the setting of TBI, and others.    Earlier today it appears patient's family has elected to transition to comfort care.  Palliative medicine was consulted for GOC conversations and end-of-life care.  Subjective:   Subjective: Chart Reviewed. Updates received. Patient Assessed. Created space and opportunity for patient  and family to explore thoughts and feelings regarding current medical situation.  Today's Discussion: Today saw the patient at the bedside alongside with PCCM nurse practitioner Anders Simmonds, NP as well as the patient's nurse.  The patient does appear somewhat more awake today with his eyes open, although he is not intentionally making eye contact or making intentional movements.  We discussed comfort care along with the nurse.  We described situations that would indicate a need for comfort medications.  Our goal is to keep him comfortable as a primary priority but without over sedating him so that family can spend time as well.  Later in the day I received a message from the nurse that the patient's son was at the bedside and was very agitated insisting that the patient be moved back to the ICU because he is opening his eyes.  I told the nurse that I was  in another patient's room but I would come to the bedside as soon as I was able to, likely in a couple hours.  She stated that the patient's son left because he was "too mad to stay" but she told him that I would call.  Later today I called the patient's daughter/HCPOA [name].  I gave her an update and answered questions that she had.  I also explained that I would be calling her brother, given that she is HCPOA, and to ensure that she was okay with this.  She is.  She explained that he is struggling with the situation and seems to be lashing out.  I explained that I am happy to call and help alleviate some of his distress and answer any questions he has.  I offered for her to call us for any questions or concerns and told her I would be back to see her father tomorrow and would update her again as well.  I initially called her the patient's son Coel and left a voicemail.  He did call back about 15 minutes later.  I spoke with him and I offered to explain anything, answer any questions, addressed any concerns.  He stated that "at this point he signed up for it so he could have it.  I am aggravated, angry, frustrated, and just pissed."  He stated that "palliative care is the most cruel and unusual way for someone to go as we are not even letting them moisten his lips."  I explained the use of the swab to moisten all mucous membranes.  I  gave an in-depth explanation of the various medications that are available as well as nonpharmaceutical interventions that the staff can use to promote the patient's peace, comfort, dignity.  He then asked "will is any that available for me because I am aggravated and passed."  I stated that we are unable to treat patients not currently admitted to the hospital and he said "I would be there I may be crazy but I am not stupid."  At this point I asked if he had any further questions and he said no and hung up.  I provided emotional and general support through therapeutic  listening, empathy, sharing of stories, and other techniques. I answered all questions and addressed all concerns to the best of my ability.  Review of Systems  Unable to perform ROS: Acuity of condition    Objective:   Vital Signs:  BP (!) 149/77 (BP Location: Right Arm)   Pulse 98   Temp 98.5 F (36.9 C) (Oral)   Resp 17   Ht 5\' 5"  (1.651 m)   Wt 74.6 kg   SpO2 94%   BMI 27.37 kg/m   Physical Exam Vitals and nursing note reviewed.  Constitutional:      General: He is sleeping.     Comments: Intermittently opens eyes  HENT:     Head: Normocephalic and atraumatic.  Cardiovascular:     Rate and Rhythm: Normal rate.  Pulmonary:     Effort: Pulmonary effort is normal. No respiratory distress.  Abdominal:     General: Abdomen is flat.  Skin:    General: Skin is warm and dry.  Neurological:     Mental Status: He is unresponsive.     Palliative Assessment/Data: 10%    Existing Vynca/ACP Documentation: None  Assessment & Plan:   Impression: Present on Admission: **None**  82 year old male with acute presentation chronic comorbidities as described above. Fortunately, the patient has suffered a devastating neurological injury after a fall. Because of his progressive decline despite full medical care, the family has elected to transition to comfort care. Palliative medicine was brought on board to assist with goals of care and end-of-life care. Family has a full understanding of his situation and are agreement with comfort care. Overall prognosis grave.   SUMMARY OF RECOMMENDATIONS   Remain DNR-comfort Continue full comfort care See symptom management orders below Ongoing chaplain support Continued emotional and spiritual support of patient and family Palliative medicine will continue to follow for symptom management/comfort care  Symptom Management:  Tylenol 650 mg PR every 6 hours as needed mild pain or fever Robinul 0.2 mg IV every 4 hours as needed oral  secretions Keppra 1000 mg IV twice daily for seizure prophylaxis Ativan 2 mg IV Q 5 minutes as needed for seizure Morphine 1 mg IV every hour as needed severe pain or increased work of breathing Zofran 4 mg IV every 6 hours as needed nausea  Code Status: DNR-comfort  Prognosis: Hours - Days  Discharge Planning: Anticipated Hospital Death  Discussed with: Patient's family, medical team, nursing team  Thank you for allowing [name] to participate in the care of SAW PERELLI PMT will continue to support holistically.  Time Total: 60 min  Detailed review of medical records (labs, imaging, vital signs), medically appropriate exam, discussed with treatment team, counseling and education to patient, family, & staff, documenting clinical information, medication management, coordination of care  Wynne Dust, NP Palliative Medicine Team  Team Phone # [phone] (Nights/Weekends)  01/23/2021, 8:17 AM

## 2023-04-30 NOTE — Care Management Important Message (Signed)
Important Message  Patient Details  Name: Kenneth Montoya MRN: [id number] Date of Birth: 12-30-1940   Important Message Given:  No     Sherilyn Banker 04/30/2023, 2:17 PM

## 2023-04-30 NOTE — Progress Notes (Signed)
This chaplain is present for EOL F/U spiritual care. Family is not present at the bedside. The Pt. Is resting comfortably.   The chaplain prayed at the bedside and updated the RN on the visit.  Chaplain Stephanie Acre [phone]

## 2023-04-30 NOTE — Plan of Care (Signed)
  Problem: Skin Integrity: Goal: Risk for impaired skin integrity will decrease Outcome: Progressing   

## 2023-05-01 DIAGNOSIS — I609 Nontraumatic subarachnoid hemorrhage, unspecified: Secondary | ICD-10-CM | POA: Diagnosis not present

## 2023-05-01 DIAGNOSIS — Z7189 Other specified counseling: Secondary | ICD-10-CM | POA: Diagnosis not present

## 2023-05-01 DIAGNOSIS — Z66 Do not resuscitate: Secondary | ICD-10-CM | POA: Diagnosis not present

## 2023-05-01 DIAGNOSIS — Z515 Encounter for palliative care: Secondary | ICD-10-CM | POA: Diagnosis not present

## 2023-05-01 LAB — CULTURE, BLOOD (ROUTINE X 2)
Culture: NO GROWTH
Culture: NO GROWTH
Special Requests: ADEQUATE

## 2023-05-01 NOTE — Progress Notes (Signed)
Daily Progress Note   Patient Name: Kenneth Montoya       Date: 05/01/2023 DOB: 1940/12/03  Age: 82 y.o. MRN#: [id number] Attending Physician: [name], MD Primary Care Physician: Etta Grandchild, MD Admit Date: 04/26/2023 Length of Stay: 5 days  Reason for Consultation/Follow-up: Establishing goals of care and Terminal Care  HPI/Patient Profile:  82 y.o. male  with past medical history of mild cognitive decline, recent THA in 01/2023, wound infection and likely septic arthritis after a fall causing injury to his incision in Oct 2024 who presented with confusion after a fall this morning.  admitted on 04/26/2023 with traumatic SAH after fall traumatic bilateral subdural hemorrhage, new onset seizure, acute encephalopathy in the setting of TBI, and others.    Earlier today it appears patient's family has elected to transition to comfort care.  Palliative medicine was consulted for GOC conversations and end-of-life care.  Subjective:   Subjective: Chart Reviewed. Updates received. Patient Assessed. Created space and opportunity for patient  and family to explore thoughts and feelings regarding current medical situation.  Today's Discussion: Today saw the patient at the bedside alongside with Dr. Sharl Ma with the hospitalist team.  The patient does appear somewhat more awake today with his eyes open, and attempting to communicate.  His speech is a bit garbled and slurred, difficult to understand.  However when asked if he is hurting he did indicate no.  I asked if he was hungry and he said no, I asked if he is thirsty he said yes.  After asking what he would like several times I was able to discern "coke".  I saw something on his tongue and asked him to open wide and after several attempts he did and with a flashlight I could see there is some bruising on his tongue from a previous injury during seizure as well as some debris in his mouth.  During my visit I did note occasional cough which seems to  be weak and effort.  I worry that this could be a sign of silent aspiration even of his own secretions.  I spoke with Dr. Sharl Ma about the patient's increasing wakefulness likely due to exiting the postictal state after seizure.  However, feel that it is unlikely safe for him to eat or drink anything substantial given his garbled speech and likely high risk for aspiration/dysphagia.  Overall we do not feel this changes the overall picture.  My understanding is he would never want to "live in a facility" and even with increasing wakefulness, I fear that this would be his outcome with any change of course.   I discussed with the nurse mouth care with wet swabs to keep his mucous membranes moist.  I also discussed allowing him to have some coke via drinking straw drops to promote comfort and happiness.  Later in the day I received a message from the nurse that the patient's son was at the bedside and was very agitated insisting that the patient be moved back to the ICU because he is opening his eyes.  I told the nurse that I was in another patient's room but I would come to the bedside as soon as I was able to, likely in a couple hours.  She stated that the patient's son left because he was "too mad to stay" but she told him that I would call.  Later today I called the patient's daughter/HCPOA Kenneth Montoya.  I gave her an update and answered questions that she had.  She is encouraged by his change in mental status stating "we have been appearing for a miracle."  I shared the medical opinion that this unlikely changes to his overall picture.  However, she shares her fear with not on turning every possible stone.  She is asking about possible further diagnostic workup including a repeat CT scan to see if there is any improvement or change in his head bleed.  I recommended that this would be a better question for the hospitalist directing his overall care.  I offered to reach out to him and contact them for further  discussion which she appreciates.  She is on her way to the hospital now.  I reached out to Dr. Sharl Ma and informed him of our discussions.  He states he will try to see the patient's daughter at the bedside when she arrives.  I will ask palliative medicine colleague to follow-up tomorrow for symptom check and/or evaluate for changes in any requested change of course by Kenneth Montoya.  I provided emotional and general support through therapeutic listening, empathy, sharing of stories, and other techniques. I answered all questions and addressed all concerns to the best of my ability.  ROS severely Limited by condition Review of Systems  Constitutional:        Denies pain in general, not hungry, is thirsty    Objective:   Vital Signs:  BP (!) 149/77 (BP Location: Right Arm)   Pulse 98   Temp 98.5 F (36.9 C) (Oral)   Resp 17   Ht 5\' 5"  (1.651 m)   Wt 80 kg   SpO2 94%   BMI 29.35 kg/m   Physical Exam Vitals and nursing note reviewed.  Constitutional:      General: He is not in acute distress.    Appearance: He is ill-appearing.     Comments: Speech is garbled and difficult to understand  HENT:     Head: Normocephalic and atraumatic.  Cardiovascular:     Rate and Rhythm: Normal rate.     Comments: Intermittent cough in the room, noted to be weak Pulmonary:     Effort: Pulmonary effort is normal. No respiratory distress.  Abdominal:     General: Abdomen is flat.  Skin:    General: Skin is warm and dry.  Neurological:     Mental Status: He is lethargic.  Psychiatric:        Mood and Affect: Mood normal.        Behavior: Behavior normal.     Palliative Assessment/Data: 10-20%    Existing Vynca/ACP Documentation: None  Assessment & Plan:   Impression: Present on Admission: **None**  82 year old male with acute presentation chronic comorbidities as described above. Fortunately, the patient has suffered a devastating neurological injury after a fall. Because of his  progressive decline despite full medical care, the family elected to transition to comfort care. Palliative medicine was brought on board to assist with goals of care and end-of-life care.  Today the patient seems to be more awake, likely has exited postictal state.  Still remains high risk for seizures, aspiration precautions without high risk for decompensation.  We are providing continued comfort care, daughter coming to the bedside and plans to meet with Dr. Sharl Ma for further discussion.  Overall prognosis poor to grave.   SUMMARY OF RECOMMENDATIONS   Remain DNR-comfort Continue full comfort care See symptom management orders below Ongoing chaplain support Continued emotional and spiritual support of patient and family Will follow for clinical changes and  any change in families request for ongoing care Palliative medicine will continue to follow for symptom management, clinical changes, goals of care discussions  Symptom Management:  Tylenol 650 mg PR every 6 hours as needed mild pain or fever Robinul 0.2 mg IV every 4 hours as needed oral secretions Keppra 1000 mg IV twice daily for seizure prophylaxis Ativan 2 mg IV Q 5 minutes as needed for seizure Morphine 1 mg IV every hour as needed severe pain or increased work of breathing Zofran 4 mg IV every 6 hours as needed nausea  Code Status: DNR-comfort  Prognosis: Hours - Days  Discharge Planning: Anticipated Hospital Death  Discussed with: Patient's family, medical team, nursing team  Thank you for allowing [name] to participate in the care of Kenneth Montoya PMT will continue to support holistically.  Time Total: 60 min  Detailed review of medical records (labs, imaging, vital signs), medically appropriate exam, discussed with treatment team, counseling and education to patient, family, & staff, documenting clinical information, medication management, coordination of care  Wynne Dust, NP Palliative Medicine Team  Team Phone #  [phone] (Nights/Weekends)  01/23/2021, 8:17 AM

## 2023-05-01 NOTE — Progress Notes (Signed)
[facility]  PROGRESS NOTE  Kenneth Montoya WJX:[id number] DOB: 03-09-1941 DOA: 04/26/2023 PCP: Kenneth Grandchild, MD   Brief HPI:    82 y/o gentleman with a history of mild cognitive decline, recent THA in 01/2023, wound infection and likely septic arthritis after a fall causing injury to his incision in Oct 2024 who presented with confusion after a fall this morning. He had an unwitnessed fall around 5:45 this morning at his rehab, and family visiting around 11AM felt that he was not acting normally and noticed the injury on the back of his head. They had him brought to the ED where he was found to have a SAH. He fell about a week ago as well, but no known injuries from that fall. He has previously been on plavix. He had a headache last night per his daughter, but otherwise has not had complaints. He is now more confused than his baseline per daughter. He completed his planned IV course of cefazolin on 11/28 and had his PICC removed on 11/29. He was started on cefadroxil suppressive therapy as planned after completion of his IV course of antibiotics. His family had been working on a safe discharge plan for him this week with some difficulty-- their mother has cognitive impairment and his daughter and the SW do not feel that Kenneth Montoya could safely be at home caring for himself. He has felt pretty strongly that he wants to go home and told his daughter he does not ever want to live in a facility.       Assessment/Plan:   Traumatic SAH status post fall -Traumatic bifrontal contusion, SDH -Seen by neurosurgery, not a candidate for surgery  Seizure, new onset -Insetting of above  Acute encephalopathy in setting of traumatic brain injury -Patient was getting more lethargic, probable postictal state  Recent septic arthritis of prosthetic hip -Completed antibiotics  Acute urinary retention due to BPH: Complicated Foley placement requiring urology assistance 12/1   Goals of care  discussion -Goals of care discussion done with palliative care, patient made full comfort care  Dysphagia -Patient woke up this morning and family wants to check for swallowing -Will obtain formal swallow evaluation     Medications     hydrocerin  1 Application Topical BID   olopatadine  1 drop Both Eyes QHS     Data Reviewed:   CBG:  Recent Labs  Lab 04/28/23 1544 04/28/23 1921 04/28/23 2316 04/29/23 0339 04/29/23 0743  GLUCAP 113* 131* 137* 125* 108*    SpO2: 94 % O2 Flow Rate (L/min): 2 L/min    Vitals:   04/29/23 2204 04/30/23 0704 04/30/23 0735 05/01/23 0443  BP: 138/69  (!) 149/77   Pulse: (!) 105  98   Resp:   17   Temp: 99 F (37.2 C)  98.5 F (36.9 C)   TempSrc: Oral  Oral   SpO2: 93%  94%   Weight:  74.6 kg  80 kg  Height:          Data Reviewed:  Basic Metabolic Panel: Recent Labs  Lab 04/26/23 1230 04/26/23 1248 04/27/23 0634 04/28/23 0658  NA 126* 126* 129* 132*  K 3.7 3.8 3.6 3.3*  CL 90* 90* 90* 89*  CO2 22  --  25 28  GLUCOSE 142* 146* 119* 130*  BUN 13 13 13 14   CREATININE 1.10 0.90 1.12 0.90  CALCIUM 8.6*  --  8.9 8.7*    CBC: Recent Labs  Lab 04/26/23 1230 04/26/23 1248  04/27/23 0634 04/28/23 0658  WBC 16.2*  --  16.0* 15.7*  HGB 9.8* 10.5* 8.7* 8.4*  HCT 30.5* 31.0* 25.9* 25.8*  MCV 83.3  --  81.7 81.1  PLT 323  --  304 281    LFT Recent Labs  Lab 04/26/23 1230  AST 28  ALT 12  ALKPHOS 106  BILITOT 0.8  PROT 6.9  ALBUMIN 2.9*     Antibiotics: Anti-infectives (From admission, onward)    Start     Dose/Rate Route Frequency Ordered Stop   04/26/23 2200  cefadroxil (DURICEF) capsule 1,000 mg  Status:  Discontinued        1,000 mg Oral 2 times daily 04/26/23 1741 04/26/23 1800   04/26/23 1800  cefadroxil (DURICEF) capsule 1,000 mg  Status:  Discontinued        1,000 mg Oral 2 times daily 04/26/23 1639 04/29/23 0753        DVT prophylaxis: SCDs  Code Status: DNR  Family Communication:  Discussed with daughter at bedside   CONSULTS neurosurgery, CCM   Subjective   This morning patient woke up and asked for [employer].  Answering questions appropriately.   Objective    Physical Examination:   General-appears in no acute distress Heart-S1-S2, regular, no murmur auscultated Lungs-clear to auscultation bilaterally, no wheezing or crackles auscultated Abdomen-soft, nontender, no organomegaly Extremities-no edema in the lower extremities Neuro-alert, communicating well   Status is: Inpatient:             [name]   [facility] If 7PM-7AM, please contact night-coverage at [email or web address], Office  [phone]   05/01/2023, 9:17 AM  LOS: 5 days

## 2023-05-02 ENCOUNTER — Inpatient Hospital Stay (HOSPITAL_COMMUNITY): Payer: Medicare Other

## 2023-05-02 DIAGNOSIS — Z7189 Other specified counseling: Secondary | ICD-10-CM | POA: Diagnosis not present

## 2023-05-02 DIAGNOSIS — E871 Hypo-osmolality and hyponatremia: Secondary | ICD-10-CM

## 2023-05-02 DIAGNOSIS — I609 Nontraumatic subarachnoid hemorrhage, unspecified: Secondary | ICD-10-CM | POA: Diagnosis not present

## 2023-05-02 DIAGNOSIS — S0636AA Traumatic hemorrhage of cerebrum, unspecified, with loss of consciousness status unknown, initial encounter: Secondary | ICD-10-CM | POA: Diagnosis not present

## 2023-05-02 DIAGNOSIS — R410 Disorientation, unspecified: Secondary | ICD-10-CM | POA: Diagnosis not present

## 2023-05-02 DIAGNOSIS — Z515 Encounter for palliative care: Secondary | ICD-10-CM | POA: Diagnosis not present

## 2023-05-02 NOTE — Plan of Care (Signed)
Voiding trial ordered for 7:00 tomorrow am

## 2023-05-02 NOTE — Evaluation (Signed)
Clinical/Bedside Swallow Evaluation Patient Details  Name: Kenneth Montoya MRN: [id number] Date of Birth: 1941/02/21  Today's Date: 05/02/2023 Time: SLP Start Time (ACUTE ONLY): 1111 SLP Stop Time (ACUTE ONLY): 1124 SLP Time Calculation (min) (ACUTE ONLY): 13 min  Past Medical History:  Past Medical History:  Diagnosis Date   Anxiety    Arthritis    generalized   BPH (benign prostatic hyperplasia)    Cancer of skin, squamous cell    frozen    Cataract    bilateral sx   GERD (gastroesophageal reflux disease)    on meds   Hypertension    on meds   Shingles    SIADH (syndrome of inappropriate ADH production) (HCC)    Stroke [facility])    Past Surgical History:  Past Surgical History:  Procedure Laterality Date   CARPAL TUNNEL RELEASE Left 09/09/2022   Procedure: LEFT CARPAL TUNNEL RELEASE;  Surgeon: Betha Loa, MD;  Location: [facility];  Service: Orthopedics;  Laterality: Left;  Bier block   CATARACT EXTRACTION, BILATERAL     COLONOSCOPY  06/27/2020   COLONOSCOPY  2018   EYE SURGERY     gum graft     HIP ARTHROPLASTY Right 03/13/2023   Procedure: IRRIGATION AND DEBRIDEMENT RIGHT HIP;  Surgeon: Ollen Gross, MD;  Location: [facility];  Service: Orthopedics;  Laterality: Right;   INGUINAL HERNIA REPAIR     as infant   LUMBAR LAMINECTOMY  06/2015   POLYPECTOMY  2018   TA x 3   REVERSE TOTAL SHOULDER ARTHROPLASTY Bilateral    TONSILLECTOMY AND ADENOIDECTOMY     age 31    TOTAL HIP ARTHROPLASTY Right 02/12/2023   Procedure: RIGHT TOTAL HIP ARTHROPLASTY ANTERIOR APPROACH;  Surgeon: Ollen Gross, MD;  Location: [facility];  Service: Orthopedics;  Laterality: Right;   TOTAL KNEE ARTHROPLASTY Bilateral    TOTAL KNEE REVISION Right 12/25/2017   TRANSURETHRAL RESECTION OF PROSTATE     x 2   UPPER GASTROINTESTINAL ENDOSCOPY     HPI:  Pt is an 82 yo male admitted with AMS s/p fall at SNF. CT Head revealed extensive bifrontal contusions R>L, diffuse SAH, small bilateral  SDH, and R frontal skull fx.  Pt became less responsive and transitioned to full comfort care.  Now more alert after being off medications and goals of care are dependent on swallow function.  Pt with suspected aspiration event with thin liquid grape juice on 12/5. PMH includes: CVA, mild cognitive decline, GERD, recent THA 01/2023, wound infection and likely septic arthritis 02/2023    Assessment / Plan / Recommendation  Clinical Impression  Pt presents with clinical indicators of pharyngeal dysphagia.  Dark red secretions in oral cavity on SLP arrival ?oral bleeding v. juice.  SLP provided oral care.  There was occasional throat clear with thin liquid by cup and straw.  There was anterior loss of thin liquid by cup which was in part caused by poor positioning with head to R side, but labial seal was inadequate to contain bolus trials by soon.  Straw improved oral containement of liquids.  Pt's mental status has improved and he is requesting drinks.  Per report, pt given grape juice by family overnight with coughing and suspected aspiration. Grape juice at beside with swab.  Pt tolerated puree without overt s/s of aspiration and exhibited good oral clearance.  There was mild oral residue with regular texture solid and prolonged oral phase. Pt had some difficulty biting through cracker.  Oral phase  was more efficient with soft solids.  Unable to make a recommendation for a safe oral diet clinically.  Discussed with MD an we will proceed with MBS late this date to assist in decision making around goals of care.     Recommend pt remain NPO pending results of instrumental assessment.   SLP Visit Diagnosis: Dysphagia, unspecified (R13.10)    Aspiration Risk  Mild aspiration risk;Moderate aspiration risk    Diet Recommendation NPO         Other  Recommendations Oral Care Recommendations: Oral care QID    Recommendations for follow up therapy are one component of a multi-disciplinary discharge planning  process, led by the attending physician.  Recommendations may be updated based on patient status, additional functional criteria and insurance authorization.  Follow up Recommendations  (TBD)      Assistance Recommended at Discharge  N/A  Functional Status Assessment  (TBD)  Frequency and Duration  (TBD)          Prognosis Prognosis for improved oropharyngeal function:  (TBD)      Swallow Study   General Date of Onset: 04/26/23 HPI: Pt is an 82 yo male admitted with AMS s/p fall at SNF. CT Head revealed extensive bifrontal contusions R>L, diffuse SAH, small bilateral SDH, and R frontal skull fx.  Pt became less responsive and transitioned to full comfort care.  Now more alert after being off medications and goals of care are dependent on swallow function.  Pt with suspected aspiration event with thin liquid grape juice on 12/5. PMH includes: CVA, mild cognitive decline, GERD, recent THA 01/2023, wound infection and likely septic arthritis 02/2023 Type of Study: Bedside Swallow Evaluation Previous Swallow Assessment: 12/1 BSE Diet Prior to this Study: NPO Temperature Spikes Noted: No History of Recent Intubation: No Behavior/Cognition: Alert;Cooperative Oral Care Completed by SLP: Yes Oral Cavity - Dentition: Adequate natural dentition Self-Feeding Abilities: Needs assist Patient Positioning: Upright in bed Baseline Vocal Quality: Hoarse Volitional Cough: Cognitively unable to elicit Volitional Swallow: Unable to elicit    Oral/Motor/Sensory Function Overall Oral Motor/Sensory Function:  (unable to assess)   Ice Chips Ice chips: Impaired Oral Phase Impairments: Impaired mastication   Thin Liquid Thin Liquid: Impaired Presentation: Cup;Spoon;Straw Oral Phase Functional Implications: Right anterior spillage Pharyngeal  Phase Impairments: Throat Clearing - Delayed;Throat Clearing - Immediate    Nectar Thick Nectar Thick Liquid: Not tested   Honey Thick Honey Thick Liquid: Not  tested   Puree Puree: Within functional limits Presentation: Spoon   Solid     Solid: Impaired Oral Phase Functional Implications: Prolonged oral transit      Kerrie Pleasure, MA, CCC-SLP Acute Rehabilitation Services Office: [phone] 05/02/2023,11:53 AM

## 2023-05-02 NOTE — Progress Notes (Signed)
Modified Barium Swallow Study  Patient Details  Name: Kenneth Montoya MRN: [id number] Date of Birth: 1940/10/10  Today's Date: 05/02/2023  Modified Barium Swallow completed.  Full report located under Chart Review in the Imaging Section.  History of Present Illness Pt is an 82 yo male admitted with AMS s/p fall at SNF. CT Head revealed extensive bifrontal contusions R>L, diffuse SAH, small bilateral SDH, and R frontal skull fx.  Pt became less responsive and transitioned to full comfort care.  Now more alert after being off medications and goals of care are dependent on swallow function.  Pt with suspected aspiration event with thin liquid grape juice on 12/5. PMH includes: CVA, mild cognitive decline, GERD, recent THA 01/2023, wound infection and likely septic arthritis 02/2023   Clinical Impression Pt presents with dry, damaged mucosa likely due to days without oral intake. Suspect dry pharyngeal mucosa as well given that primary impairment is liquids and solids clinging to pharyngeal tissue in the absence of significant weakness. Pts swallow is generally timely and ROM is WNL. However there is residue present on the tongue, the vallecuale and pyriform sinuses. Pt has trace sensed aspiration of thin liquids due to accumulation of residue x2. Pt is stimulable for a second swallow to clear residue. Did not test solids gievn the condition of his oral mucosa.  Signs of a small cervical esophageal web, did not obstruct bolus flow. Recommend pt resume PO intake, nectar thick liquids and puree with upright posture. Will be candidate for upgraded textures if he generally improves otherwise. Factors that may increase risk of adverse event in presence of aspiration [employer] 2021): Frail or deconditioned;Dependence for feeding and/or oral hygiene;Inadequate oral hygiene;Reduced saliva  Swallow Evaluation Recommendations Liquid Administration via: Cup;Straw Medication Administration: Crushed with  puree Supervision: Full assist for feeding;Full supervision/cueing for swallowing strategies     Harlon Ditty, MA CCC-SLP  Acute Rehabilitation Services Secure Chat Preferred Office [phone]  Claudine Mouton 05/02/2023,3:06 PM

## 2023-05-02 NOTE — Progress Notes (Signed)
[facility]  PROGRESS NOTE  Kenneth Montoya WUJ:[id number] DOB: [birth year] DOA: 04/26/2023 PCP: Etta Grandchild, MD   Brief HPI:    82 y/o gentleman with a history of mild cognitive decline, recent THA in 01/2023, wound infection and likely septic arthritis after a fall causing injury to his incision in Oct 2024 who presented with confusion after a fall this morning. He had an unwitnessed fall around 5:45 this morning at his rehab, and family visiting around 11AM felt that he was not acting normally and noticed the injury on the back of his head. They had him brought to the ED where he was found to have a SAH. He fell about a week ago as well, but no known injuries from that fall. He has previously been on plavix. He had a headache last night per his daughter, but otherwise has not had complaints. He is now more confused than his baseline per daughter. He completed his planned IV course of cefazolin on 11/28 and had his PICC removed on 11/29. He was started on cefadroxil suppressive therapy as planned after completion of his IV course of antibiotics. His family had been working on a safe discharge plan for him this week with some difficulty-- their mother has cognitive impairment and his daughter and the SW do not feel that [name] could safely be at home caring for himself. He has felt pretty strongly that he wants to go home and told his daughter he does not ever want to live in a facility.       Assessment/Plan:   Traumatic SAH status post fall -Traumatic bifrontal contusion, SDH -Seen by neurosurgery, not a candidate for surgery  Seizure, new onset -Insetting of above -Patient was made comfort care -Will continue with Keppra 1000 mg twice daily -Continue Ativan as needed  Acute encephalopathy in setting of traumatic brain injury -Resolved -Initially patient was lethargic, felt to be postictal state -He became more alert and started communicating yesterday on  05/01/2023   Recent septic arthritis of prosthetic hip -Completed antibiotics  Acute urinary retention due to BPH: Complicated Foley placement requiring urology assistance 12/1  -Voiding trial tomorrow morning   Dysphagia -Patient woke up this morning and family wants to check for swallowing --Swallow evaluation obtained -Started on pured diet with nectar thick liquid; will advance diet as tolerated -Speech therapy following  Goals of care -Patient was made full comfort care in the ICU and was transferred to floor on his medications were stopped -Patient initially was encephalopathic, after coming to medical floor he has become more alert and started communicating.  Family is not sure whether we should keep him is full comfort care only versus treat aggressively.  I discussed with patient's daughter [name], and explained that this could be a temporary phase of improvement.  Swallow elevation obtained, patient started on pured diet with nectar thick liquid.  Will reassess over next 24 hours, if patient continues to improve, will consult neurosurgery again to get their opinion regarding SAH/SDH    Medications     hydrocerin  1 Application Topical BID   olopatadine  1 drop Both Eyes QHS     Data Reviewed:   CBG:  Recent Labs  Lab 04/28/23 1544 04/28/23 1921 04/28/23 2316 04/29/23 0339 04/29/23 0743  GLUCAP 113* 131* 137* 125* 108*    SpO2: 96 % O2 Flow Rate (L/min): 2 L/min    Vitals:   04/30/23 0735 05/01/23 0443 05/02/23 0439 05/02/23 0455  BP: (!) 149/77  132/81   Pulse: 98  (!) 105   Resp: 17  16   Temp: 98.5 F (36.9 C)  98.2 F (36.8 C)   TempSrc: Oral  Oral   SpO2: 94%  96%   Weight:  80 kg  79.7 kg  Height:          Data Reviewed:  Basic Metabolic Panel: Recent Labs  Lab 04/26/23 1230 04/26/23 1248 04/27/23 0634 04/28/23 0658  NA 126* 126* 129* 132*  K 3.7 3.8 3.6 3.3*  CL 90* 90* 90* 89*  CO2 22  --  25 28  GLUCOSE 142* 146* 119* 130*   BUN 13 13 13 14   CREATININE 1.10 0.90 1.12 0.90  CALCIUM 8.6*  --  8.9 8.7*    CBC: Recent Labs  Lab 04/26/23 1230 04/26/23 1248 04/27/23 0634 04/28/23 0658  WBC 16.2*  --  16.0* 15.7*  HGB 9.8* 10.5* 8.7* 8.4*  HCT 30.5* 31.0* 25.9* 25.8*  MCV 83.3  --  81.7 81.1  PLT 323  --  304 281    LFT Recent Labs  Lab 04/26/23 1230  AST 28  ALT 12  ALKPHOS 106  BILITOT 0.8  PROT 6.9  ALBUMIN 2.9*     Antibiotics: Anti-infectives (From admission, onward)    Start     Dose/Rate Route Frequency Ordered Stop   04/26/23 2200  cefadroxil (DURICEF) capsule 1,000 mg  Status:  Discontinued        1,000 mg Oral 2 times daily 04/26/23 1741 04/26/23 1800   04/26/23 1800  cefadroxil (DURICEF) capsule 1,000 mg  Status:  Discontinued        1,000 mg Oral 2 times daily 04/26/23 1639 04/29/23 0753        DVT prophylaxis: SCDs  Code Status: DNR  Family Communication: Discussed with daughter at bedside   CONSULTS neurosurgery, CCM   Subjective   Denies any complaints.  Has been answering questions appropriately.   Objective    Physical Examination:  General-appears in no acute distress Heart-S1-S2, regular, no murmur auscultated Lungs-clear to auscultation bilaterally, no wheezing or crackles auscultated Abdomen-soft, nontender, no organomegaly Extremities-no edema in the lower extremities Neuro-alert, oriented x3, no focal deficit noted   Status is: Inpatient:             [name]   [facility] If 7PM-7AM, please contact night-coverage at [email or web address], Office  [phone]   05/02/2023, 9:45 AM  LOS: 6 days

## 2023-05-02 NOTE — Progress Notes (Addendum)
Daily Progress Note   Patient Name: Kenneth Montoya       Date: 05/02/2023 DOB: Mar 02, 1941  Age: 82 y.o. MRN#: [id number] Attending Physician: [name], MD Primary Care Physician: Etta Grandchild, MD Admit Date: 04/26/2023  Reason for Consultation/Follow-up: Establishing goals of care  Subjective: Patient in bed. Denies pain or discomfort. Asks if I know where his phone is. No family at bedside.  Length of Stay: 6  Current Medications: Scheduled Meds:   hydrocerin  1 Application Topical BID   olopatadine  1 drop Both Eyes QHS    Continuous Infusions:  levETIRAcetam 1,000 mg (05/01/23 2200)    PRN Meds: acetaminophen, glycopyrrolate, ipratropium-albuterol, labetalol, LORazepam, morphine injection, ondansetron (ZOFRAN) IV, mouth rinse  Physical Exam Constitutional:      Appearance: He is ill-appearing.  HENT:     Head: Normocephalic and atraumatic.  Cardiovascular:     Rate and Rhythm: Tachycardia present.  Pulmonary:     Effort: Pulmonary effort is normal.  Skin:    General: Skin is warm and dry.  Neurological:     Mental Status: He is alert.             Vital Signs: BP 132/81 (BP Location: Left Arm)   Pulse (!) 105   Temp 98.2 F (36.8 C) (Oral)   Resp 16   Ht 5\' 5"  (1.651 m)   Wt 79.7 kg   SpO2 96%   BMI 29.24 kg/m  SpO2: SpO2: 96 % O2 Device: O2 Device: Room Air O2 Flow Rate: O2 Flow Rate (L/min): 2 L/min       Patient Active Problem List   Diagnosis Date Noted   Traumatic intracerebral hemorrhage with unknown loss of consciousness status (HCC) 04/29/2023   SAH (subarachnoid hemorrhage) (HCC) 04/26/2023   Obesity (BMI 30-39.9) 03/14/2023   Leg swelling 03/12/2023   Acute kidney injury 03/12/2023   PVD (peripheral vascular disease) (HCC)  03/12/2023   Normocytic anemia 03/12/2023   Transient hypoxia, resolved 03/11/2023   Possible closed right periprosthetic hip fracture, initial encounter (HCC) 03/11/2023   Acute metabolic encephalopathy 03/11/2023   MSSA bacteremia due to operative wound infection 03/11/2023   Adjustment disorder 03/11/2023   OA (osteoarthritis) of hip 02/12/2023   Osteoarthritis of right hip 02/12/2023   Lumbar stenosis 01/25/2022   TIA (transient  ischemic attack) 01/18/2022   Allergic contact dermatitis due to drugs in contact with skin 10/09/2021   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/04/2021   SIADH (syndrome of inappropriate ADH production) (HCC) 01/10/2021   Hyponatremia 01/05/2021   Hyperlipidemia with target LDL less than 100 12/17/2020   Stroke (HCC) 12/09/2020   Cerebral infarction (HCC) 12/09/2020   Psychophysiological insomnia 11/21/2020   GAD (generalized anxiety disorder) 11/21/2020   BPH (benign prostatic hyperplasia)    Essential hypertension 11/08/2019   Gastroesophageal reflux disease without esophagitis 11/08/2019   Herpes simplex 11/08/2019   Primary erectile dysfunction 11/08/2019   Senile purpura (HCC) 05/13/2019   Chronic allergic rhinitis due to pollen 03/24/2017   LPRD (laryngopharyngeal reflux disease) 03/24/2017   Bladder outflow obstruction 03/04/2016    Palliative Care Assessment & Plan   Patient Profile: 82 y.o. male  with past medical history of mild cognitive decline, recent THA in 01/2023, wound infection and likely septic arthritis after a fall causing injury to his incision in Oct 2024 who presented with confusion after a fall this morning.  admitted on 04/26/2023 with traumatic SAH after fall traumatic bilateral subdural hemorrhage, new onset seizure, acute encephalopathy in the setting of TBI, and others.    Earlier today it appears patient's family has elected to transition to comfort care.  Palliative medicine was consulted for GOC conversations and end-of-life  care.  Today's Discussion: Patient in bed denies pain or discomfort.   Spoke with patient's daughter [name]. She is concerned that her father has not had a new swallow study. She understands it is currently unsafe for her father to eat/ drink. She tells me nursing shared the patient was asking for food this morning. I shared that it was ordered yesterday evening. We discussed using mouth swabs to provide comfort and allow her father to get the taste of requested beverages.   [name] shares that her brother allowed the patient to drink an entire cup of grape juice last night after being told he was not allowed. Afterwards the patient had a coughing spell and had to be suctioned. [name] shares that her brother is a "loose cannon" and she is concerned about her brother visiting her father. She shares they have complex family dynamics. [name] shares she does not think it is safe for her brother to be around their father. She asked about having his visitation restricted. I told her I would look into this process.  After speaking with nursing staff I called [name] back. She would like to hold off on restricting her brother's visitation until she is able to speak with the rest of the family.   Answered questions and provided emotional support. Encouraged the family to call PMT with any questions or concerns.  1700: Received a message to call patient's daughter [name]. She shares that patient had a good day and was more himself than he has been. She also shared he was able to tolerate the nectar thick liquids without issue. She is questioning whether a comfort path is the appropriate path for her father at this time. We discussed taking things one day at a time and reassessing how he is doing over the next 24-48 hours. We plan to talk again tomorrow.  Recommendations/Plan: DNR Full comfort care Symptom management per [facility] Continued PMT support  Goals of Care and Additional Recommendations: Limitations on Scope  of Treatment: Full Comfort Care  Code Status:    Code Status Orders  (From admission, onward)  Start     Ordered   04/30/23 1622  Do not attempt resuscitation (DNR) - Comfort care  (Code Status)  Continuous       Question Answer Comment  If patient has no pulse and is not breathing Do Not Attempt Resuscitation   In Pre-Arrest Conditions (Patient Is Breathing and Has a Pulse) Provide comfort measures. Relieve any mechanical airway obstruction. Avoid transfer unless required for comfort.   Consent: Discussion documented in EHR or advanced directives reviewed      04/30/23 1621         Extensive chart review has been completed prior to seeing the patient including labs, vital signs, imaging, progress/consult notes, orders, medications, and available advance directive documents.   Care plan was discussed with bedside RN Romeo Apple and Dr. Sharl Ma  Total time: 80 minutes  Thank you for allowing the Palliative Medicine Team to assist in the care of this patient.   Sherryll Burger, NP  Please contact Palliative Medicine Team phone at [phone] for questions and concerns.

## 2023-05-03 DIAGNOSIS — I609 Nontraumatic subarachnoid hemorrhage, unspecified: Secondary | ICD-10-CM | POA: Diagnosis not present

## 2023-05-03 DIAGNOSIS — Z7189 Other specified counseling: Secondary | ICD-10-CM | POA: Diagnosis not present

## 2023-05-03 DIAGNOSIS — S0636AA Traumatic hemorrhage of cerebrum, unspecified, with loss of consciousness status unknown, initial encounter: Secondary | ICD-10-CM | POA: Diagnosis not present

## 2023-05-03 DIAGNOSIS — R410 Disorientation, unspecified: Secondary | ICD-10-CM | POA: Diagnosis not present

## 2023-05-03 DIAGNOSIS — Z515 Encounter for palliative care: Secondary | ICD-10-CM | POA: Diagnosis not present

## 2023-05-03 DIAGNOSIS — E871 Hypo-osmolality and hyponatremia: Secondary | ICD-10-CM | POA: Diagnosis not present

## 2023-05-03 LAB — COMPREHENSIVE METABOLIC PANEL
ALT: 43 U/L (ref 0–44)
AST: 61 U/L — ABNORMAL HIGH (ref 15–41)
Albumin: 2.5 g/dL — ABNORMAL LOW (ref 3.5–5.0)
Alkaline Phosphatase: 102 U/L (ref 38–126)
Anion gap: 9 (ref 5–15)
BUN: 25 mg/dL — ABNORMAL HIGH (ref 8–23)
CO2: 29 mmol/L (ref 22–32)
Calcium: 8.3 mg/dL — ABNORMAL LOW (ref 8.9–10.3)
Chloride: 103 mmol/L (ref 98–111)
Creatinine, Ser: 1.2 mg/dL (ref 0.61–1.24)
GFR, Estimated: >60 mL/min (ref >60)
Glucose, Bld: 129 mg/dL — ABNORMAL HIGH (ref 70–99)
Potassium: 3.1 mmol/L — ABNORMAL LOW (ref 3.5–5.1)
Sodium: 141 mmol/L (ref 135–145)
Total Bilirubin: 0.7 mg/dL (ref <1.2)
Total Protein: 6.1 g/dL — ABNORMAL LOW (ref 6.5–8.1)

## 2023-05-03 LAB — CBC
HCT: 32.8 % — ABNORMAL LOW (ref 39.0–52.0)
Hemoglobin: 10.1 g/dL — ABNORMAL LOW (ref 13.0–17.0)
MCH: 26 pg (ref 26.0–34.0)
MCHC: 30.8 g/dL (ref 30.0–36.0)
MCV: 84.3 fL (ref 80.0–100.0)
Platelets: 270 10*3/uL (ref 150–400)
RBC: 3.89 MIL/uL — ABNORMAL LOW (ref 4.22–5.81)
RDW: 16.6 % — ABNORMAL HIGH (ref 11.5–15.5)
WBC: 11.1 10*3/uL — ABNORMAL HIGH (ref 4.0–10.5)
nRBC: 0 % (ref 0.0–0.2)

## 2023-05-03 MED ORDER — TAMSULOSIN HCL 0.4 MG PO CAPS
0.4000 mg | ORAL_CAPSULE | Freq: Every day | ORAL | Status: DC
  Administered 2023-05-03 – 2023-05-05 (×3): 0.4 mg via ORAL
  Filled 2023-05-03 (×4): qty 1

## 2023-05-03 MED ORDER — ENSURE ENLIVE PO LIQD
237.0000 mL | Freq: Two times a day (BID) | ORAL | Status: DC
  Administered 2023-05-04 – 2023-05-05 (×3): 237 mL via ORAL

## 2023-05-03 MED ORDER — POTASSIUM CHLORIDE CRYS ER 20 MEQ PO TBCR
40.0000 meq | EXTENDED_RELEASE_TABLET | Freq: Two times a day (BID) | ORAL | Status: AC
  Administered 2023-05-03 (×2): 40 meq via ORAL
  Filled 2023-05-03 (×2): qty 2

## 2023-05-03 NOTE — Progress Notes (Signed)
[facility]  PROGRESS NOTE  Kenneth Montoya ZOX:[id number] DOB: 15-Jan-1941 DOA: 04/26/2023 PCP: Etta Grandchild, MD   Brief HPI:    82 y/o gentleman with a history of mild cognitive decline, recent THA in 01/2023, wound infection and likely septic arthritis after a fall causing injury to his incision in Oct 2024 who presented with confusion after a fall this morning. He had an unwitnessed fall around 5:45 this morning at his rehab, and family visiting around 11AM felt that he was not acting normally and noticed the injury on the back of his head. They had him brought to the ED where he was found to have a SAH. He fell about a week ago as well, but no known injuries from that fall. He has previously been on plavix. He had a headache last night per his daughter, but otherwise has not had complaints. He is now more confused than his baseline per daughter. He completed his planned IV course of cefazolin on 11/28 and had his PICC removed on 11/29. He was started on cefadroxil suppressive therapy as planned after completion of his IV course of antibiotics. His family had been working on a safe discharge plan for him this week with some difficulty-- their mother has cognitive impairment and his daughter and the SW do not feel that [name] could safely be at home caring for himself. He has felt pretty strongly that he wants to go home and told his daughter he does not ever want to live in a facility.       Assessment/Plan:   Traumatic SAH status post fall -Initially seen by neurosurgery, patient was encephalopathic at that time -Traumatic bifrontal contusion, SDH -Was not felt to be a surgical candidate -He is more awake now, called and discussed with Dr. Lovell Sheehan, neurosurgeon on-call -As per Dr. Lovell Sheehan if patient is improving he does not need any further workup including imaging or surgical intervention -If he starts to decline then neurosurgery can be reconsulted -Will obtain PT  evaluation  Seizure, new onset -Insetting of above -Patient was made comfort care -Will continue with Keppra 1000 mg twice daily -Continue Ativan as needed  Acute encephalopathy in setting of traumatic brain injury -Resolved -Initially patient was lethargic, felt to be postictal state -He became more alert and started communicating yesterday on 05/01/2023  Hypokalemia -Potassium is 3.1 -Replace potassium and follow BMP in am   Recent septic arthritis of prosthetic hip -Completed antibiotics  Acute urinary retention due to BPH: Complicated Foley placement requiring urology assistance 12/1  -Voiding trial tomorrow morning   Dysphagia -Patient woke up this morning and family wants to check for swallowing --Swallow evaluation obtained -Started on pured diet with nectar thick liquid; will advance diet as tolerated -Speech therapy following  Goals of care -Patient was made full comfort care in the ICU and was transferred to floor on his medications were stopped -Patient initially was encephalopathic, after coming to medical floor he has become more alert and started communicating.  Family is not sure whether we should keep him is full comfort care only versus treat aggressively.  I discussed with patient's daughter Kenneth Montoya, and explained that this could be a temporary phase of improvement.  Swallow elevation obtained, patient started on pured diet with nectar thick liquid.  PT evaluation obtained.  Palliative care following    Medications     hydrocerin  1 Application Topical BID   olopatadine  1 drop Both Eyes QHS     Data Reviewed:  CBG:  Recent Labs  Lab 04/28/23 1544 04/28/23 1921 04/28/23 2316 04/29/23 0339 04/29/23 0743  GLUCAP 113* 131* 137* 125* 108*    SpO2: 100 % O2 Flow Rate (L/min): 2 L/min    Vitals:   05/02/23 0455 05/02/23 0750 05/03/23 0445 05/03/23 0813  BP:  131/81 138/84 (!) 140/72  Pulse:  (!) 105 98 100  Resp:  18 18 18   Temp:  (!)  97.5 F (36.4 C) 98.3 F (36.8 C) 99 F (37.2 C)  TempSrc:  Axillary Oral Oral  SpO2:  99% 96% 100%  Weight: 79.7 kg     Height:          Data Reviewed:  Basic Metabolic Panel: Recent Labs  Lab 04/26/23 1230 04/26/23 1248 04/27/23 0634 04/28/23 0658 05/03/23 0417  NA 126* 126* 129* 132* 141  K 3.7 3.8 3.6 3.3* 3.1*  CL 90* 90* 90* 89* 103  CO2 22  --  25 28 29   GLUCOSE 142* 146* 119* 130* 129*  BUN 13 13 13 14  25*  CREATININE 1.10 0.90 1.12 0.90 1.20  CALCIUM 8.6*  --  8.9 8.7* 8.3*    CBC: Recent Labs  Lab 04/26/23 1230 04/26/23 1248 04/27/23 0634 04/28/23 0658 05/03/23 0417  WBC 16.2*  --  16.0* 15.7* 11.1*  HGB 9.8* 10.5* 8.7* 8.4* 10.1*  HCT 30.5* 31.0* 25.9* 25.8* 32.8*  MCV 83.3  --  81.7 81.1 84.3  PLT 323  --  304 281 270    LFT Recent Labs  Lab 04/26/23 1230 05/03/23 0417  AST 28 61*  ALT 12 43  ALKPHOS 106 102  BILITOT 0.8 0.7  PROT 6.9 6.1*  ALBUMIN 2.9* 2.5*     Antibiotics: Anti-infectives (From admission, onward)    Start     Dose/Rate Route Frequency Ordered Stop   04/26/23 2200  cefadroxil (DURICEF) capsule 1,000 mg  Status:  Discontinued        1,000 mg Oral 2 times daily 04/26/23 1741 04/26/23 1800   04/26/23 1800  cefadroxil (DURICEF) capsule 1,000 mg  Status:  Discontinued        1,000 mg Oral 2 times daily 04/26/23 1639 04/29/23 0753        DVT prophylaxis: SCDs  Code Status: DNR  Family Communication: Discussed with daughter at bedside   CONSULTS neurosurgery, CCM   Subjective   Denies any complaints.  Drinking water.  Says that he is not hungry.   Objective    Physical Examination:  Appears in no acute distress, following commands S1-S2, regular, no murmur auscultated Abdomen is soft, nontender, no organomegaly Extremities no edema   Status is: Inpatient:             [name]   [facility] If 7PM-7AM, please contact night-coverage at [email or web address], Office   [phone]   05/03/2023, 9:24 AM  LOS: 7 days

## 2023-05-03 NOTE — Progress Notes (Signed)
Trying to obtain a Coude catheter with drainage bag kit for pt, delayed from [employer].

## 2023-05-03 NOTE — Progress Notes (Addendum)
Daily Progress Note   Kenneth Montoya Name: Kenneth Montoya       Date: 05/03/2023 DOB: [birth year]  Age: 82 y.o. MRN#: [id number] Attending Physician: [name], MD Primary Care Physician: Etta Grandchild, MD Admit Date: 04/26/2023  Reason for Consultation/Follow-up: Establishing goals of care  Length of Stay: 7  Current Medications: Scheduled Meds:   hydrocerin  1 Application Topical BID   olopatadine  1 drop Both Eyes QHS   potassium chloride  40 mEq Oral BID   tamsulosin  0.4 mg Oral Daily    Continuous Infusions:  levETIRAcetam 1,000 mg (05/03/23 0855)    PRN Meds: acetaminophen, glycopyrrolate, ipratropium-albuterol, labetalol, LORazepam, morphine injection, ondansetron (ZOFRAN) IV, mouth rinse  Physical Exam HENT:     Head: Normocephalic and atraumatic.  Cardiovascular:     Rate and Rhythm: Tachycardia present.  Pulmonary:     Effort: Pulmonary effort is normal.  Skin:    General: Skin is warm and dry.  Neurological:     Mental Status: Kenneth Montoya is alert.             Vital Signs: BP (!) 140/72 (BP Location: Left Arm)   Pulse 100   Temp 99 F (37.2 C) (Oral)   Resp 18   Ht 5\' 5"  (1.651 m)   Wt 79.7 kg   SpO2 100%   BMI 29.24 kg/m  SpO2: SpO2: 100 % O2 Device: O2 Device: Room Air O2 Flow Rate: O2 Flow Rate (L/min): 2 L/min       Kenneth Montoya Active Problem List   Diagnosis Date Noted   Traumatic intracerebral hemorrhage with unknown loss of consciousness status (HCC) 04/29/2023   SAH (subarachnoid hemorrhage) (HCC) 04/26/2023   Obesity (BMI 30-39.9) 03/14/2023   Leg swelling 03/12/2023   Acute kidney injury 03/12/2023   PVD (peripheral vascular disease) (HCC) 03/12/2023   Normocytic anemia 03/12/2023   Transient hypoxia, resolved 03/11/2023   Possible closed  right periprosthetic hip fracture, initial encounter (HCC) 03/11/2023   Acute metabolic encephalopathy 03/11/2023   MSSA bacteremia due to operative wound infection 03/11/2023   Adjustment disorder 03/11/2023   OA (osteoarthritis) of hip 02/12/2023   Osteoarthritis of right hip 02/12/2023   Lumbar stenosis 01/25/2022   TIA (transient ischemic attack) 01/18/2022   Allergic contact dermatitis due to drugs in contact with skin  10/09/2021   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/04/2021   SIADH (syndrome of inappropriate ADH production) (HCC) 01/10/2021   Hyponatremia 01/05/2021   Hyperlipidemia with target LDL less than 100 12/17/2020   Stroke (HCC) 12/09/2020   Cerebral infarction (HCC) 12/09/2020   Psychophysiological insomnia 11/21/2020   GAD (generalized anxiety disorder) 11/21/2020   BPH (benign prostatic hyperplasia)    Essential hypertension 11/08/2019   Gastroesophageal reflux disease without esophagitis 11/08/2019   Herpes simplex 11/08/2019   Primary erectile dysfunction 11/08/2019   Senile purpura (HCC) 05/13/2019   Chronic allergic rhinitis due to pollen 03/24/2017   LPRD (laryngopharyngeal reflux disease) 03/24/2017   Bladder outflow obstruction 03/04/2016    Palliative Care Assessment & Plan   Kenneth Montoya Profile: 82 y.o. male  with past medical history of mild cognitive decline, recent THA in 01/2023, wound infection and likely septic arthritis after a fall causing injury to his incision in Oct 2024 who presented with confusion after a fall this morning.  admitted on 04/26/2023 with traumatic SAH after fall traumatic bilateral subdural hemorrhage, new onset seizure, acute encephalopathy in the setting of TBI, and others.    Earlier today it appears Kenneth Montoya's family has elected to transition to comfort care.  Palliative medicine was consulted for GOC conversations and end-of-life care.  Today's Discussion: Kenneth Montoya in bed. Denies pain or discomfort. Kenneth Montoya looks better today than yesterday.  Kenneth Montoya does report being very thirsty but not very hungry. Kenneth Montoya. No family at bedside.  Spoke with Dr. Sharl Ma- Plan is to reassess how Kenneth Montoya does over next 24 hours. PT to see today.  1530: Left voicemail for daughter Kenneth Montoya  1191: Spoke with Kenneth Montoya's daughter Kenneth Montoya. Discussed Kenneth Montoya's plan. Answered questions about discharge options depending on Kenneth Montoya's continued clinical course. She is cautiously optimistic.   Recommendations/Plan: DNR Full comfort care Symptom management per [facility] See how Kenneth Montoya is doing in 24 hours Continued PMT support  Goals of Care and Additional Recommendations: Limitations on Scope of Treatment: Full Comfort Care  Code Status:    Code Status Orders  (From admission, onward)           Start     Ordered   04/30/23 1622  Do not attempt resuscitation (DNR) - Comfort care  (Code Status)  Continuous       Question Answer Comment  If Kenneth Montoya has no pulse and is not breathing Do Not Attempt Resuscitation   In Pre-Arrest Conditions (Kenneth Montoya Is Breathing and Has a Pulse) Provide comfort measures. Relieve any mechanical airway obstruction. Avoid transfer unless required for comfort.   Consent: Discussion documented in EHR or advanced directives reviewed      04/30/23 1621         Extensive chart review has been completed prior to seeing the Kenneth Montoya including labs, vital signs, imaging, progress/consult notes, orders, medications, and available advance directive documents.   Care plan was discussed withDr. Sharl Ma  Total time: 50 minutes  Thank you for allowing the Palliative Medicine Team to assist in the care of this Kenneth Montoya.   Sherryll Burger, NP  Please contact Palliative Medicine Team phone at [phone] for questions and concerns.

## 2023-05-03 NOTE — Progress Notes (Signed)
15 F 10cc Coude catheter placed without difficulty by Israa, RN and I was second Training and development officer. Urine obtained 600cc. Dtr and wife called and spoke with pt.

## 2023-05-03 NOTE — Evaluation (Signed)
Physical Therapy Re-Evaluation Patient Details Name: Kenneth Montoya MRN: [id number] DOB: May 19, 1941 Today's Date: 05/03/2023  History of Present Illness  Pt is an 82 y.o. M admitted 11/30 after fall at [facility]- rehab with Diffuse SAH in the right sylvian fissure and suprasellar cisterns, Bil SDH and nondisplaced Rt paramedian frontal skull fx.  Pt became less responsive and transitioned to full comfort care.  Now more alert after being off medications and PT re-consulted to assess progress. PMhx: HTN, CAD, TIA, PVD, HLD, and SIADH with chronic hyponatremia, Rt TKA, bil reverse TSA. Rt THA  02/12/23 , fall 03/10/23 with nondisplaced fx of acetabulum with fluid in Rt quadriceps s/p I&D Rt hip 03/13/23.   Clinical Impression  Pt admitted with above diagnosis. Pt currently with functional limitations due to the deficits listed below (see PT Problem List). At the time of PT re-eval pt was able to transition from bed>chair with +2 assist for balance support and safety. Pt with difficulty advancing feet and shifting weight with RW however improved with 2 person HHA. Discussed with granddaughters who were present during session. It is reasonable to keep pt on PT caseload if the plan will be to lift full comfort measures. Pt is vocal about wanting to go home at d/c however family indicates that there may not be caregivers present at home that could provide the level of assist pt is currently requiring. Recommend continued rehab <3 hours/day with a goal of transitioning to ALF which family reports was already in the works. Will continue to follow and update recommendations as decisions are made by the pt and family.       If plan is discharge home, recommend the following: A lot of help with walking and/or transfers;A lot of help with bathing/dressing/bathroom;Assistance with cooking/housework;Direct supervision/assist for medications management;Assist for transportation;Supervision due to cognitive  status;Direct supervision/assist for financial management   Can travel by private vehicle   No    Equipment Recommendations BSC/3in1  Recommendations for Other Services  OT consult    Functional Status Assessment Patient has had a recent decline in their functional status and demonstrates the ability to make significant improvements in function in a reasonable and predictable amount of time.     Precautions / Restrictions Precautions Precautions: Fall Restrictions Weight Bearing Restrictions: No      Mobility  Bed Mobility Overal bed mobility: Needs Assistance Bed Mobility: Supine to Sit     Supine to sit: Mod assist     General bed mobility comments: Assist to advance LE's off EOB. Hand over hand assist to reach for railing and pt was able to help pull himself over a little bit. Mod assist for trunk elevation to full sitting position.    Transfers Overall transfer level: Needs assistance Equipment used: Rolling walker (2 wheels), 2 person hand held assist Transfers: Sit to/from Stand, Bed to chair/wheelchair/BSC Sit to Stand: +2 safety/equipment, Mod assist, +2 physical assistance Stand pivot transfers: +2 safety/equipment, Mod assist, +2 physical assistance         General transfer comment: Initially attempted with RW for support. Pt able to stand but unable to march in place or take side steps at EOB. Attempted again with 2 person HHA and pt was able to take pivotal steps around from bed>chair.    Ambulation/Gait               General Gait Details: Uable to progress to gait training at this time.  Stairs  Wheelchair Mobility     Tilt Bed    Modified Rankin (Stroke Patients Only) Modified Rankin (Stroke Patients Only) Pre-Morbid Rankin Score: Moderate disability Modified Rankin: Moderately severe disability     Balance Overall balance assessment: Needs assistance   Sitting balance-Leahy Scale: Fair Sitting balance - Comments:  EOB with CGA for safety   Standing balance support: Bilateral upper extremity supported Standing balance-Leahy Scale: Zero Standing balance comment: +2 required                             Pertinent Vitals/Pain Pain Assessment Pain Assessment: No/denies pain Pain Intervention(s): Monitored during session    Home Living Family/patient expects to be discharged to:: Skilled nursing facility                   Additional Comments: Pt reports that he sleeps in a recliner at home.    Prior Function Prior Level of Function : History of Falls (last six months);Independent/Modified Independent             Mobility Comments: per last admission was living at home prior to THA and using RW. Pt currently unable to provide PLOF at SNF ADLs Comments: Pt reports completing without assist.     Extremity/Trunk Assessment   Upper Extremity Assessment Upper Extremity Assessment: Generalized weakness    Lower Extremity Assessment Lower Extremity Assessment: Generalized weakness;RLE deficits/detail RLE Deficits / Details: Increased weakness likely 2 recent surgery.    Cervical / Trunk Assessment Cervical / Trunk Assessment: Kyphotic  Communication   Communication Communication: Hearing impairment;Difficulty communicating thoughts/reduced clarity of speech;Difficulty following commands/understanding Following commands: Follows one step commands consistently;Follows one step commands with increased time Cueing Techniques: Gestural cues;Tactile cues;Verbal cues  Cognition Arousal: Alert Behavior During Therapy: WFL for tasks assessed/performed Overall Cognitive Status: Impaired/Different from baseline Area of Impairment: Following commands, Safety/judgement, Attention, Awareness, Problem solving                   Current Attention Level: Sustained   Following Commands: Follows one step commands with increased time, Follows one step commands  consistently Safety/Judgement: Decreased awareness of safety, Decreased awareness of deficits Awareness: Emergent Problem Solving: Slow processing, Requires verbal cues, Requires tactile cues General Comments: Pt pleasant and grateful, thanking PT after almost every instance of assist that was provided.        General Comments      Exercises     Assessment/Plan    PT Assessment Patient needs continued PT services  PT Problem List Decreased strength;Decreased mobility;Decreased range of motion;Decreased activity tolerance;Decreased cognition;Decreased balance;Decreased knowledge of use of DME;Decreased coordination;Decreased knowledge of precautions       PT Treatment Interventions DME instruction;Gait training;Functional mobility training;Therapeutic activities;Patient/family education;Cognitive remediation;Neuromuscular re-education;Balance training;Therapeutic exercise    PT Goals (Current goals can be found in the Care Plan section)  Acute Rehab PT Goals Patient Stated Goal: Pt wishes to return home at d/c. PT Goal Formulation: Patient unable to participate in goal setting Time For Goal Achievement: 05/17/23 Potential to Achieve Goals: Fair    Frequency Min 1X/week     Co-evaluation               AM-PAC PT "6 Clicks" Mobility  Outcome Measure Help needed turning from your back to your side while in a flat bed without using bedrails?: A Lot Help needed moving from lying on your back to sitting on the side of a flat bed without  using bedrails?: A Lot Help needed moving to and from a bed to a chair (including a wheelchair)?: A Lot Help needed standing up from a chair using your arms (e.g., wheelchair or bedside chair)?: A Lot Help needed to walk in hospital room?: Total Help needed climbing 3-5 steps with a railing? : Total 6 Click Score: 10    End of Session Equipment Utilized During Treatment: Gait belt Activity Tolerance: Patient tolerated treatment  well Patient left: in chair;with call bell/phone within reach;with chair alarm set Nurse Communication: Mobility status PT Visit Diagnosis: Other abnormalities of gait and mobility (R26.89);History of falling (Z91.81);Muscle weakness (generalized) (M62.81)    Time: 1610-9604 PT Time Calculation (min) (ACUTE ONLY): 23 min   Charges:   PT Evaluation $PT Re-evaluation: 1 Re-eval PT Treatments $Gait Training: 8-22 mins PT General Charges $$ ACUTE PT VISIT: 1 Visit         Conni Slipper, PT, DPT Acute Rehabilitation Services Secure Chat Preferred Office: [phone]   Marylynn Pearson 05/03/2023, 1:51 PM

## 2023-05-03 NOTE — Plan of Care (Signed)
Pt has not voided yet, Dr. Sharl Ma aware. Will insert Coude catheter if unable to void in 1 hour.

## 2023-05-04 DIAGNOSIS — S0636AA Traumatic hemorrhage of cerebrum, unspecified, with loss of consciousness status unknown, initial encounter: Secondary | ICD-10-CM | POA: Diagnosis not present

## 2023-05-04 DIAGNOSIS — I609 Nontraumatic subarachnoid hemorrhage, unspecified: Secondary | ICD-10-CM | POA: Diagnosis not present

## 2023-05-04 DIAGNOSIS — Z7189 Other specified counseling: Secondary | ICD-10-CM | POA: Diagnosis not present

## 2023-05-04 LAB — BASIC METABOLIC PANEL
Anion gap: 11 (ref 5–15)
BUN: 19 mg/dL (ref 8–23)
CO2: 25 mmol/L (ref 22–32)
Calcium: 8.6 mg/dL — ABNORMAL LOW (ref 8.9–10.3)
Chloride: 106 mmol/L (ref 98–111)
Creatinine, Ser: 1.27 mg/dL — ABNORMAL HIGH (ref 0.61–1.24)
GFR, Estimated: 56 mL/min — ABNORMAL LOW (ref >60)
Glucose, Bld: 127 mg/dL — ABNORMAL HIGH (ref 70–99)
Potassium: 4.2 mmol/L (ref 3.5–5.1)
Sodium: 142 mmol/L (ref 135–145)

## 2023-05-04 MED ORDER — IPRATROPIUM-ALBUTEROL 0.5-2.5 (3) MG/3ML IN SOLN
3.0000 mL | Freq: Four times a day (QID) | RESPIRATORY_TRACT | Status: DC | PRN
  Administered 2023-05-04: 3 mL via RESPIRATORY_TRACT
  Filled 2023-05-04: qty 3

## 2023-05-04 MED ORDER — GUAIFENESIN 100 MG/5ML PO LIQD
15.0000 mL | Freq: Three times a day (TID) | ORAL | Status: DC
Start: 2023-05-04 — End: 2023-05-06
  Administered 2023-05-04 – 2023-05-05 (×3): 15 mL via ORAL
  Filled 2023-05-04 (×5): qty 15

## 2023-05-04 MED ORDER — IPRATROPIUM-ALBUTEROL 0.5-2.5 (3) MG/3ML IN SOLN: 3.0000 mL | Freq: Four times a day (QID) | RESPIRATORY_TRACT | Status: DC

## 2023-05-04 NOTE — Progress Notes (Signed)
[facility]  PROGRESS NOTE  Kenneth Montoya UJW:[id number] DOB: 12-12-1940 DOA: 04/26/2023 PCP: Etta Grandchild, MD   Brief HPI:    82 y/o gentleman with a history of mild cognitive decline, recent THA in 01/2023, wound infection and likely septic arthritis after a fall causing injury to his incision in Oct 2024 who presented with confusion after a fall this morning. He had an unwitnessed fall around 5:45 this morning at his rehab, and family visiting around 11AM felt that he was not acting normally and noticed the injury on the back of his head. They had him brought to the ED where he was found to have a SAH. He fell about a week ago as well, but no known injuries from that fall. He has previously been on plavix. He had a headache last night per his daughter, but otherwise has not had complaints. He is now more confused than his baseline per daughter. He completed his planned IV course of cefazolin on 11/28 and had his PICC removed on 11/29. He was started on cefadroxil suppressive therapy as planned after completion of his IV course of antibiotics. His family had been working on a safe discharge plan for him this week with some difficulty-- their mother has cognitive impairment and his daughter and the SW do not feel that Kenneth Montoya could safely be at home caring for himself. He has felt pretty strongly that he wants to go home and told his daughter he does not ever want to live in a facility.       Assessment/Plan:   Traumatic SAH status post fall -Initially seen by neurosurgery, patient was encephalopathic at that time -Traumatic bifrontal contusion, SDH -Was not felt to be a surgical candidate -He is more awake now, called and discussed with Dr. Lovell Sheehan, neurosurgeon on-call -As per Dr. Lovell Sheehan if patient is improving he does not need any further workup including imaging or surgical intervention -If he starts to decline then neurosurgery can be reconsulted -Will obtain PT  evaluation  Cough -He has bilateral rhonchi -Start Mucinex, DuoNeb nebulizers  Seizure, new onset -Insetting of above -Patient was made comfort care -Will continue with Keppra 1000 mg twice daily -Continue Ativan as needed  Acute encephalopathy in setting of traumatic brain injury -Resolved -Initially patient was lethargic, felt to be postictal state -He became more alert and started communicating yesterday on 05/01/2023  Hypokalemia -Replete   Recent septic arthritis of prosthetic hip -Completed antibiotics  Acute urinary retention due to BPH: Complicated Foley placement requiring urology assistance 12/1  -Foley catheter reinserted   Dysphagia -Patient woke up this morning and family wants to check for swallowing --Swallow evaluation obtained -Started on pured diet with nectar thick liquid; will advance diet as tolerated -Speech therapy following  Goals of care -Patient was made full comfort care in the ICU and was transferred to floor on his medications were stopped -Patient initially was encephalopathic, after coming to medical floor he has become more alert and started communicating.  Family is not sure whether we should keep him is full comfort care only versus treat aggressively.  I discussed with patient's daughter Kenneth Montoya, and explained that this could be a temporary phase of improvement.  Swallow elevation obtained, patient started on pured diet with nectar thick liquid.  PT evaluation obtained.  Palliative care following    Medications     feeding supplement  237 mL Oral BID BM   hydrocerin  1 Application Topical BID   olopatadine  1  drop Both Eyes QHS   tamsulosin  0.4 mg Oral Daily     Data Reviewed:   CBG:  Recent Labs  Lab 04/28/23 1544 04/28/23 1921 04/28/23 2316 04/29/23 0339 04/29/23 0743  GLUCAP 113* 131* 137* 125* 108*    SpO2: 98 % O2 Flow Rate (L/min): 2 L/min    Vitals:   05/03/23 0813 05/03/23 2006 05/04/23 0500 05/04/23 0717   BP: (!) 140/72 (!) 155/84  137/72  Pulse: 100 (!) 109  (!) 104  Resp: 18 20  16   Temp: 99 F (37.2 C) 98.5 F (36.9 C)  98.7 F (37.1 C)  TempSrc: Oral Oral    SpO2: 100% 97%  98%  Weight:   80.4 kg   Height:          Data Reviewed:  Basic Metabolic Panel: Recent Labs  Lab 04/28/23 0658 05/03/23 0417 05/04/23 0642  NA 132* 141 142  K 3.3* 3.1* 4.2  CL 89* 103 106  CO2 28 29 25   GLUCOSE 130* 129* 127*  BUN 14 25* 19  CREATININE 0.90 1.20 1.27*  CALCIUM 8.7* 8.3* 8.6*    CBC: Recent Labs  Lab 04/28/23 0658 05/03/23 0417  WBC 15.7* 11.1*  HGB 8.4* 10.1*  HCT 25.8* 32.8*  MCV 81.1 84.3  PLT 281 270    LFT Recent Labs  Lab 05/03/23 0417  AST 61*  ALT 43  ALKPHOS 102  BILITOT 0.7  PROT 6.1*  ALBUMIN 2.5*     Antibiotics: Anti-infectives (From admission, onward)    Start     Dose/Rate Route Frequency Ordered Stop   04/26/23 2200  cefadroxil (DURICEF) capsule 1,000 mg  Status:  Discontinued        1,000 mg Oral 2 times daily 04/26/23 1741 04/26/23 1800   04/26/23 1800  cefadroxil (DURICEF) capsule 1,000 mg  Status:  Discontinued        1,000 mg Oral 2 times daily 04/26/23 1639 04/29/23 0753        DVT prophylaxis: SCDs  Code Status: DNR  Family Communication: Discussed with daughter at bedside   CONSULTS neurosurgery, CCM   Subjective   Denies any complaints.  Eating better with dysphagia 1 diet.   Objective    Physical Examination:  General-appears in no acute distress Heart-S1-S2, regular, no murmur auscultated Lungs-bilateral rhonchi auscultated Abdomen-soft, nontender, no organomegaly Extremities-no edema in the lower extremities Neuro-alert, oriented x3, no focal deficit noted  Status is: Inpatient:             [name]   [facility] If 7PM-7AM, please contact night-coverage at [email or web address], Office  [phone]   05/04/2023, 10:57 AM  LOS: 8 days

## 2023-05-04 NOTE — Plan of Care (Signed)
  Problem: Nutritional: Goal: Maintenance of adequate nutrition will improve Outcome: Progressing   Problem: Skin Integrity: Goal: Risk for impaired skin integrity will decrease Outcome: Progressing   Problem: Pain Management: Goal: General experience of comfort will improve Outcome: Progressing   Problem: Safety: Goal: Ability to remain free from injury will improve Outcome: Progressing

## 2023-05-04 NOTE — Progress Notes (Signed)
No events overnight, patient alert and oriented, denies any pain. Vss. Ate 2 cups of chocolate ice cream overnight . Safety prec in place, poc continue.

## 2023-05-04 NOTE — Plan of Care (Signed)
  Problem: Nutrition: Goal: Adequate nutrition will be maintained Outcome: Progressing   Problem: Elimination: Goal: Will not experience complications related to bowel motility Outcome: Progressing Goal: Will not experience complications related to urinary retention Outcome: Progressing   Problem: Pain Management: Goal: General experience of comfort will improve Outcome: Progressing

## 2023-05-04 NOTE — Plan of Care (Signed)

## 2023-05-04 NOTE — Progress Notes (Signed)
Daily Progress Note   Kenneth Montoya Name: Kenneth Montoya       Date: 05/04/2023 DOB: May 22, 1941  Age: 82 y.o. MRN#: [id number] Attending Physician: [name], MD Primary Care Physician: Etta Grandchild, MD Admit Date: 04/26/2023  Reason for Consultation/Follow-up: Establishing goals of care  Length of Stay: 8  Current Medications: Scheduled Meds:   feeding supplement  237 mL Oral BID BM   hydrocerin  1 Application Topical BID   olopatadine  1 drop Both Eyes QHS   tamsulosin  0.4 mg Oral Daily    Continuous Infusions:  levETIRAcetam 1,000 mg (05/04/23 0900)    PRN Meds: acetaminophen, glycopyrrolate, ipratropium-albuterol, labetalol, LORazepam, morphine injection, ondansetron (ZOFRAN) IV, mouth rinse  Physical Exam HENT:     Head: Normocephalic and atraumatic.  Cardiovascular:     Rate and Rhythm: Tachycardia present.  Pulmonary:     Effort: Pulmonary effort is normal.  Skin:    General: Skin is warm and dry.  Neurological:     Mental Status: Kenneth Montoya is alert and oriented to person, place, and time.             Vital Signs: BP 137/72 (BP Location: Left Arm)   Pulse (!) 104   Temp 98.7 F (37.1 C)   Resp 16   Ht 5\' 5"  (1.651 m)   Wt 80.4 kg   SpO2 98%   BMI 29.50 kg/m  SpO2: SpO2: 98 % O2 Device: O2 Device: Room Air O2 Flow Rate: O2 Flow Rate (L/min): 2 L/min       Kenneth Montoya Active Problem List   Diagnosis Date Noted   Traumatic intracerebral hemorrhage with unknown loss of consciousness status (HCC) 04/29/2023   SAH (subarachnoid hemorrhage) (HCC) 04/26/2023   Obesity (BMI 30-39.9) 03/14/2023   Leg swelling 03/12/2023   Acute kidney injury 03/12/2023   PVD (peripheral vascular disease) (HCC) 03/12/2023   Normocytic anemia 03/12/2023   Transient hypoxia,  resolved 03/11/2023   Possible closed right periprosthetic hip fracture, initial encounter (HCC) 03/11/2023   Acute metabolic encephalopathy 03/11/2023   MSSA bacteremia due to operative wound infection 03/11/2023   Adjustment disorder 03/11/2023   OA (osteoarthritis) of hip 02/12/2023   Osteoarthritis of right hip 02/12/2023   Lumbar stenosis 01/25/2022   TIA (transient ischemic attack) 01/18/2022   Allergic contact dermatitis  due to drugs in contact with skin 10/09/2021   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/04/2021   SIADH (syndrome of inappropriate ADH production) (HCC) 01/10/2021   Hyponatremia 01/05/2021   Hyperlipidemia with target LDL less than 100 12/17/2020   Stroke (HCC) 12/09/2020   Cerebral infarction (HCC) 12/09/2020   Psychophysiological insomnia 11/21/2020   GAD (generalized anxiety disorder) 11/21/2020   BPH (benign prostatic hyperplasia)    Essential hypertension 11/08/2019   Gastroesophageal reflux disease without esophagitis 11/08/2019   Herpes simplex 11/08/2019   Primary erectile dysfunction 11/08/2019   Senile purpura (HCC) 05/13/2019   Chronic allergic rhinitis due to pollen 03/24/2017   LPRD (laryngopharyngeal reflux disease) 03/24/2017   Bladder outflow obstruction 03/04/2016    Palliative Care Assessment & Plan   Kenneth Montoya Profile: 82 y.o. male  with past medical history of mild cognitive decline, recent THA in 01/2023, wound infection and likely septic arthritis after a fall causing injury to Kenneth incision in Oct 2024 who presented with confusion after a fall this morning.  admitted on 04/26/2023 with traumatic SAH after fall traumatic bilateral subdural hemorrhage, new onset seizure, acute encephalopathy in the setting of TBI, and others.    Earlier today it appears Kenneth Montoya's family has elected to transition to comfort care.  Palliative medicine was consulted for GOC conversations and end-of-life care.  Today's Discussion: Kenneth Montoya in bed. Denies pain or discomfort.  Kenneth Montoya is eating a pureed breakfast. No family at bedside. PT saw Kenneth Montoya yesterday  22 Spoke with daughter Kenneth Montoya by phone. She shares Kenneth Montoya was able to eat two chocolate ice creams when she was there. We discuss Kenneth continued improvement. She is taking things one day at a time with regards to Kenneth nutrition, mobility, and anticipatory care needs. She is trying to take in the information from "all the experts" to make the best decision regarding Kenneth goals of care and whether to stay on a comfort path or not. We discussed potential discharge options again and she shares again that her Montoya would not want to go back to a skilled nursing facility.   Encouraged Kenneth Montoya to call with questions or concerns.  Recommendations/Plan: DNR Full comfort care Symptom management per [facility] See how Kenneth Montoya is doing in 24 hours Continued PMT support  Goals of Care and Additional Recommendations: Limitations on Scope of Treatment: Full Comfort Care  Code Status:    Code Status Orders  (From admission, onward)           Start     Ordered   04/30/23 1622  Do not attempt resuscitation (DNR) - Comfort care  (Code Status)  Continuous       Question Answer Comment  If Kenneth Montoya has no pulse and is not breathing Do Not Attempt Resuscitation   In Pre-Arrest Conditions (Kenneth Montoya Is Breathing and Has a Pulse) Provide comfort measures. Relieve any mechanical airway obstruction. Avoid transfer unless required for comfort.   Consent: Discussion documented in EHR or advanced directives reviewed      04/30/23 1621         Extensive chart review has been completed prior to seeing the Kenneth Montoya including labs, vital signs, imaging, progress/consult notes, orders, medications, and available advance directive documents.   Care plan was discussed withDr. Sharl Ma  Total time: 50 minutes  Thank you for allowing the Palliative Medicine Team to assist in the care of this Kenneth Montoya.   Sherryll Burger, NP  Please contact Palliative  Medicine Team phone at [phone] for questions and concerns.

## 2023-05-05 DIAGNOSIS — Z515 Encounter for palliative care: Secondary | ICD-10-CM | POA: Diagnosis not present

## 2023-05-05 DIAGNOSIS — I609 Nontraumatic subarachnoid hemorrhage, unspecified: Secondary | ICD-10-CM | POA: Diagnosis not present

## 2023-05-05 DIAGNOSIS — S0636AA Traumatic hemorrhage of cerebrum, unspecified, with loss of consciousness status unknown, initial encounter: Secondary | ICD-10-CM | POA: Diagnosis not present

## 2023-05-05 DIAGNOSIS — R627 Adult failure to thrive: Secondary | ICD-10-CM | POA: Diagnosis not present

## 2023-05-05 DIAGNOSIS — Z7189 Other specified counseling: Secondary | ICD-10-CM | POA: Diagnosis not present

## 2023-05-05 DIAGNOSIS — E43 Unspecified severe protein-calorie malnutrition: Secondary | ICD-10-CM | POA: Insufficient documentation

## 2023-05-05 MED ORDER — FOOD THICKENER (SIMPLYTHICK): 1.0000 | ORAL | Status: DC | PRN

## 2023-05-05 MED ORDER — PROSOURCE PLUS PO LIQD
30.0000 mL | Freq: Two times a day (BID) | ORAL | Status: DC
  Filled 2023-05-05 (×2): qty 30

## 2023-05-05 MED ORDER — BANATROL TF EN LIQD: 60.0000 mL | Freq: Two times a day (BID) | ENTERAL | Status: DC

## 2023-05-05 MED ORDER — ADULT MULTIVITAMIN W/MINERALS CH
1.0000 | ORAL_TABLET | Freq: Every day | ORAL | Status: DC
  Administered 2023-05-05: 1 via ORAL
  Filled 2023-05-05 (×2): qty 1

## 2023-05-05 MED ORDER — LEVETIRACETAM 500 MG PO TABS
1000.0000 mg | ORAL_TABLET | Freq: Two times a day (BID) | ORAL | Status: DC
  Administered 2023-05-05: 1000 mg via ORAL
  Filled 2023-05-05 (×2): qty 2

## 2023-05-05 NOTE — Progress Notes (Addendum)
Initial Nutrition Assessment  DOCUMENTATION CODES:   Severe malnutrition in context of acute illness/injury  INTERVENTION:   - Continue Ensure Enlive  BID, each supplement provides 350 kcal and 20 grams of protein.  - Continue MVI with minerals daily   - Provide Magic cup TID with meals, each supplement provides 290 kcal and 9 grams of protein  - Provide ProSource Plus 20 ml BID, each supplement provides 100 kcal and 15 gm protein.   - If continued Poor PO intake and within GOC recommend initiation of nutrition support   NUTRITION DIAGNOSIS:   Severe Malnutrition related to acute illness as evidenced by moderate muscle depletion, energy intake < or equal to 50% for > or equal to 5 days, percent weight loss (11.5% weight loss in 3 months).   GOAL:   Patient will meet greater than or equal to 90% of their needs   MONITOR:   PO intake, Supplement acceptance, Diet advancement, Weight trends, I & O's  REASON FOR ASSESSMENT:   Consult Assessment of nutrition requirement/status  ASSESSMENT:  82 y.o M with PMH of GERD, arthririts, HTN, SIADH, DVT, CAD, cogntitive decline, frequent falls. Presented with SAH after fall.  12/1 Dys 3 Diet, thin  12/6 DYS 1 diet. Nectar thick   Pt is a poor historian; he was able to answer some questions but, wonder if they were accurate. No family present on visit. Noted untouched Breakfast tray on counter. RN reports he had 2 sips of Ensure and an applesauce this morning but, is unsure what he had yesterday. Pt has been having continued poor PO intake with only eating 25% of his meals. Pt states having an appetite but did not want to eat breakfast. PTA pt reports eating 3 meals/day. Was willing to try Magic cup with meals.  Per documented weight history, weight is trending down. He has had a 24 lb, 11.5% weight loss in 3 months. Pt recently has developed lower leg edema.   Per palliative, his daughter is deciding whether to stay on comfort path or  not. If within GOC recommend nutrition support.   Admit weight: 87.8 kg  Current weight: 80.2 kg    05/05/23 80.2 kg  03/11/23 94.8 kg  03/06/23 88.5 kg  02/12/23 90.7 kg  02/04/23 90.7 kg  02/04/23 91.2 kg  12/31/22 89.5 kg  10/28/22 90.7 kg  10/28/22 90.9 kg  10/07/22 91.2 kg    Average Meal Intake: 12/6-12/8: 25% intake x 3 recorded meals  Nutritionally Relevant Medications: Scheduled Meds:  (feeding supplement) PROSource Plus  30 mL Oral BID BM   feeding supplement  237 mL Oral BID BM   fiber supplement (BANATROL TF)  60 mL Oral BID   guaiFENesin  15 mL Oral TID   hydrocerin  1 Application Topical BID   levETIRAcetam  1,000 mg Oral BID   multivitamin with minerals  1 tablet Oral Daily   olopatadine  1 drop Both Eyes QHS   tamsulosin  0.4 mg Oral Daily   Labs Reviewed: Creatinine 1.27, Calcium 8.6,   NUTRITION - FOCUSED PHYSICAL EXAM:  Flowsheet Row Most Recent Value  Orbital Region No depletion  Upper Arm Region Mild depletion  Thoracic and Lumbar Region No depletion  Buccal Region No depletion  Temple Region Severe depletion  Clavicle Bone Region Moderate depletion  Clavicle and Acromion Bone Region Moderate depletion  Scapular Bone Region Moderate depletion  Dorsal Hand Mild depletion  Patellar Region Unable to assess  [Fluid]  Anterior Thigh Region  Unable to assess  [Fluid]  Posterior Calf Region Unable to assess  [Fluid]  Edema (RD Assessment) Moderate  Hair Reviewed  Eyes Reviewed  Mouth Reviewed  Skin Reviewed  Nails Reviewed       Diet Order:   Diet Order             DIET - DYS 1 Room service appropriate? Yes with Assist; Fluid consistency: Nectar Thick  Diet effective now                   EDUCATION NEEDS:   Not appropriate for education at this time  Skin:  Skin Assessment: Reviewed RN Assessment  Last BM:  05/04/2023, type 6, medium  Height:   Ht Readings from Last 1 Encounters:  04/26/23 5\' 5"  (1.651 m)    Weight:    Wt Readings from Last 1 Encounters:  05/05/23 80.2 kg    Ideal Body Weight:  61.82 kg  BMI:  Body mass index is 29.42 kg/m.  Estimated Nutritional Needs:   Kcal:  1800-2000  Protein:  90-100 gm  Fluid:  >1.8L   Elliot Dally, RD Registered Dietitian  See Amion for more information

## 2023-05-05 NOTE — Plan of Care (Signed)
Problem: Education: Goal: Ability to describe self-care measures that may prevent or decrease complications (Diabetes Survival Skills Education) will improve 05/05/2023 1923 by Oneal Deputy, Arta Silence, RN Outcome: Not Progressing 05/05/2023 1923 by Oneal Deputy, Arta Silence, RN Outcome: Progressing Goal: Individualized Educational Video(s) 05/05/2023 1923 by Oneal Deputy, Arta Silence, RN Outcome: Not Progressing 05/05/2023 1923 by Oneal Deputy, Rickard Rhymes Chem, RN Outcome: Progressing   Problem: Coping: Goal: Ability to adjust to condition or change in health will improve 05/05/2023 1923 by Corinda Gubler, RN Outcome: Not Progressing 05/05/2023 1923 by Oneal Deputy, Rickard Rhymes Chem, RN Outcome: Progressing   Problem: Fluid Volume: Goal: Ability to maintain a balanced intake and output will improve 05/05/2023 1923 by Corinda Gubler, RN Outcome: Not Progressing 05/05/2023 1923 by Oneal Deputy, Rickard Rhymes Chem, RN Outcome: Progressing   Problem: Health Behavior/Discharge Planning: Goal: Ability to identify and utilize available resources and services will improve 05/05/2023 1923 by Corinda Gubler, RN Outcome: Not Progressing 05/05/2023 1923 by Oneal Deputy, Rickard Rhymes Chem, RN Outcome: Progressing Goal: Ability to manage health-related needs will improve 05/05/2023 1923 by Corinda Gubler, RN Outcome: Not Progressing 05/05/2023 1923 by Oneal Deputy, Rickard Rhymes Chem, RN Outcome: Progressing   Problem: Metabolic: Goal: Ability to maintain appropriate glucose levels will improve 05/05/2023 1923 by Paralee Cancel Chem, RN Outcome: Not Progressing 05/05/2023 1923 by Oneal Deputy, Rickard Rhymes Chem, RN Outcome: Progressing   Problem: Nutritional: Goal: Maintenance of adequate nutrition will improve 05/05/2023 1923 by Corinda Gubler, RN Outcome: Not  Progressing 05/05/2023 1923 by Oneal Deputy, Rickard Rhymes Chem, RN Outcome: Progressing Goal: Progress toward achieving an optimal weight will improve 05/05/2023 1923 by Paralee Cancel Chem, RN Outcome: Not Progressing 05/05/2023 1923 by Oneal Deputy, Rickard Rhymes Chem, RN Outcome: Progressing   Problem: Skin Integrity: Goal: Risk for impaired skin integrity will decrease 05/05/2023 1923 by Paralee Cancel Chem, RN Outcome: Not Progressing 05/05/2023 1923 by Oneal Deputy, Rickard Rhymes Chem, RN Outcome: Progressing   Problem: Tissue Perfusion: Goal: Adequacy of tissue perfusion will improve 05/05/2023 1923 by Paralee Cancel Chem, RN Outcome: Not Progressing 05/05/2023 1923 by Oneal Deputy, Rickard Rhymes Chem, RN Outcome: Progressing   Problem: Education: Goal: Knowledge of General Education information will improve Description: Including pain rating scale, medication(s)/side effects and non-pharmacologic comfort measures 05/05/2023 1923 by Oneal Deputy, Rickard Rhymes Chem, RN Outcome: Not Progressing 05/05/2023 1923 by Paralee Cancel Chem, RN Outcome: Progressing   Problem: Health Behavior/Discharge Planning: Goal: Ability to manage health-related needs will improve 05/05/2023 1923 by Corinda Gubler, RN Outcome: Not Progressing 05/05/2023 1923 by Oneal Deputy, Rickard Rhymes Chem, RN Outcome: Progressing   Problem: Clinical Measurements: Goal: Ability to maintain clinical measurements within normal limits will improve 05/05/2023 1923 by Corinda Gubler, RN Outcome: Not Progressing 05/05/2023 1923 by Oneal Deputy, Rickard Rhymes Chem, RN Outcome: Progressing Goal: Will remain free from infection 05/05/2023 1923 by Corinda Gubler, RN Outcome: Not Progressing 05/05/2023 1923 by Oneal Deputy, Rickard Rhymes Chem, RN Outcome: Progressing Goal: Diagnostic test results will improve 05/05/2023 1923 by  Corinda Gubler, RN Outcome: Not Progressing 05/05/2023 1923 by Oneal Deputy, Rickard Rhymes Chem, RN Outcome: Progressing Goal: Respiratory complications will improve 05/05/2023 1923 by Corinda Gubler, RN Outcome: Not Progressing 05/05/2023 1923 by Oneal Deputy, Rickard Rhymes Chem, RN Outcome: Progressing Goal: Cardiovascular complication will be avoided 05/05/2023 1923 by Corinda Gubler, RN Outcome: Not Progressing 05/05/2023 1923 by Paralee Cancel Chem, RN Outcome: Progressing   Problem: Activity:  Goal: Risk for activity intolerance will decrease 05/05/2023 1923 by Oneal Deputy, Arta Silence, RN Outcome: Not Progressing 05/05/2023 1923 by Oneal Deputy, Rickard Rhymes Chem, RN Outcome: Progressing   Problem: Nutrition: Goal: Adequate nutrition will be maintained 05/05/2023 1923 by Corinda Gubler, RN Outcome: Not Progressing 05/05/2023 1923 by Oneal Deputy, Rickard Rhymes Chem, RN Outcome: Progressing   Problem: Coping: Goal: Level of anxiety will decrease 05/05/2023 1923 by Corinda Gubler, RN Outcome: Not Progressing 05/05/2023 1923 by Oneal Deputy, Rickard Rhymes Chem, RN Outcome: Progressing   Problem: Elimination: Goal: Will not experience complications related to bowel motility 05/05/2023 1923 by Corinda Gubler, RN Outcome: Not Progressing 05/05/2023 1923 by Oneal Deputy, Rickard Rhymes Chem, RN Outcome: Progressing Goal: Will not experience complications related to urinary retention 05/05/2023 1923 by Corinda Gubler, RN Outcome: Not Progressing 05/05/2023 1923 by Paralee Cancel Chem, RN Outcome: Progressing   Problem: Pain Management: Goal: General experience of comfort will improve 05/05/2023 1923 by Corinda Gubler, RN Outcome: Not Progressing 05/05/2023 1923 by Oneal Deputy, Rickard Rhymes Chem, RN Outcome: Progressing    Problem: Safety: Goal: Ability to remain free from injury will improve 05/05/2023 1923 by Corinda Gubler, RN Outcome: Not Progressing 05/05/2023 1923 by Oneal Deputy, Rickard Rhymes Chem, RN Outcome: Progressing   Problem: Skin Integrity: Goal: Risk for impaired skin integrity will decrease 05/05/2023 1923 by Corinda Gubler, RN Outcome: Not Progressing 05/05/2023 1923 by Paralee Cancel Chem, RN Outcome: Progressing  Patient is a comfort care

## 2023-05-05 NOTE — Progress Notes (Signed)
     Subjective: Patient was resting on exam.  I did not wake him.  No acute events overnight per nursing.  Objective: Vital signs in last 24 hours: Temp:  [99.4 F (37.4 C)-100.9 F (38.3 C)] 99.4 F (37.4 C) (12/09 1000) Pulse Rate:  [107-108] 107 (12/09 1000) Resp:  [18] 18 (12/09 1000) BP: (144-147)/(75-82) 144/75 (12/09 1000) SpO2:  [95 %-97 %] 95 % (12/09 1000) Weight:  [80.2 kg] 80.2 kg (12/09 0515)  Assessment/Plan: # Difficult Foley placement # Acute urinary retention  Failed voiding trial 12/7.  Catheter replaced Continue Flomax Do not restart Gemtesa Patient is full comfort measures.  Exchange Foley every 30 days as necessary. Urology will sign off at this time.  Please feel free to call with questions or concerns  Intake/Output from previous day: No intake/output data recorded.  Intake/Output this shift: Total I/O In: 120 [P.O.:120] Out: 400 [Urine:400]  Physical Exam:  General: Sleeping CV: No cyanosis Lungs: equal chest rise Abdomen: Soft, NTND, no rebound or guarding Gu: Foley catheter in place draining clear yellow urine  Lab Results: Recent Labs    05/03/23 0417  HGB 10.1*  HCT 32.8*   BMET Recent Labs    05/03/23 0417 05/04/23 0642  NA 141 142  K 3.1* 4.2  CL 103 106  CO2 29 25  GLUCOSE 129* 127*  BUN 25* 19  CREATININE 1.20 1.27*  CALCIUM 8.3* 8.6*     Studies/Results: No results found.    LOS: 9 days   Elmon Kirschner, NP [facility] Pager: [phone]  05/05/2023, 12:15 PM

## 2023-05-05 NOTE — Progress Notes (Signed)
[facility]  PROGRESS NOTE  Kenneth Montoya UJW:[id number] DOB: [birth year] DOA: 04/26/2023 PCP: Etta Grandchild, MD   Brief HPI:    82 y/o gentleman with a history of mild cognitive decline, recent THA in 01/2023, wound infection and likely septic arthritis after a fall causing injury to his incision in Oct 2024 who presented with confusion after a fall this morning. He had an unwitnessed fall around 5:45 this morning at his rehab, and family visiting around 11AM felt that he was not acting normally and noticed the injury on the back of his head. They had him brought to the ED where he was found to have a SAH. He fell about a week ago as well, but no known injuries from that fall. He has previously been on plavix. He had a headache last night per his daughter, but otherwise has not had complaints. He is now more confused than his baseline per daughter. He completed his planned IV course of cefazolin on 11/28 and had his PICC removed on 11/29. He was started on cefadroxil suppressive therapy as planned after completion of his IV course of antibiotics. His family had been working on a safe discharge plan for him this week with some difficulty-- their mother has cognitive impairment and his daughter and the SW do not feel that Kenneth Montoya could safely be at home caring for himself. He has felt pretty strongly that he wants to go home and told his daughter he does not ever want to live in a facility.       Assessment/Plan:   Traumatic SAH status post fall -Initially seen by neurosurgery, patient was encephalopathic at that time -Traumatic bifrontal contusion, SDH -Was not felt to be a surgical candidate -He is more awake now, called and discussed with Dr. Lovell Sheehan, neurosurgeon on-call -As per Dr. Lovell Sheehan if patient is improving he does not need any further workup including imaging or surgical intervention -If he starts to decline then neurosurgery can be reconsulted -PT/OT  evaluation  Cough -He has bilateral rhonchi -Start Mucinex, DuoNeb nebulizers  Seizure, new onset -Insetting of above -Patient was made comfort care -Will continue with Keppra 1000 mg twice daily -Continue Ativan as needed  Acute encephalopathy in setting of traumatic brain injury -Resolved -Initially patient was lethargic, felt to be postictal state -He became more alert and started communicating yesterday on 05/01/2023  Hypokalemia -Replete   Recent septic arthritis of prosthetic hip -Completed antibiotics  Acute urinary retention due to BPH: Complicated Foley placement requiring urology assistance 12/1  -Foley catheter reinserted   Dysphagia -Patient woke up this morning and family wants to check for swallowing --Swallow evaluation obtained -Started on pured diet with nectar thick liquid; will advance diet as tolerated -Speech therapy following  Goals of care -Patient was made full comfort care in the ICU and was transferred to floor on his medications were stopped -Patient initially was encephalopathic, after coming to medical floor he has become more alert and started communicating.  Family is not sure whether we should keep him is full comfort care only versus treat aggressively.  I discussed with patient's daughter Kenneth Montoya, and explained that this could be a temporary phase of improvement.  Swallow elevation obtained, patient started on pured diet with nectar thick liquid.  PT evaluation obtained.  Palliative care following    Medications     feeding supplement  237 mL Oral BID BM   guaiFENesin  15 mL Oral TID   hydrocerin  1 Application Topical  BID   multivitamin with minerals  1 tablet Oral Daily   olopatadine  1 drop Both Eyes QHS   tamsulosin  0.4 mg Oral Daily     Data Reviewed:   CBG:  Recent Labs  Lab 04/28/23 1544 04/28/23 1921 04/28/23 2316 04/29/23 0339 04/29/23 0743  GLUCAP 113* 131* 137* 125* 108*    SpO2: 95 % O2 Flow Rate (L/min):  2 L/min    Vitals:   05/04/23 0717 05/04/23 1958 05/05/23 0515 05/05/23 1000  BP: 137/72 (!) 147/82  (!) 144/75  Pulse: (!) 104 (!) 108  (!) 107  Resp: 16 18  18   Temp: 98.7 F (37.1 C) (!) 100.9 F (38.3 C)  99.4 F (37.4 C)  TempSrc:  Oral    SpO2: 98% 97%  95%  Weight:   80.2 kg   Height:          Data Reviewed:  Basic Metabolic Panel: Recent Labs  Lab 05/03/23 0417 05/04/23 0642  NA 141 142  K 3.1* 4.2  CL 103 106  CO2 29 25  GLUCOSE 129* 127*  BUN 25* 19  CREATININE 1.20 1.27*  CALCIUM 8.3* 8.6*    CBC: Recent Labs  Lab 05/03/23 0417  WBC 11.1*  HGB 10.1*  HCT 32.8*  MCV 84.3  PLT 270    LFT Recent Labs  Lab 05/03/23 0417  AST 61*  ALT 43  ALKPHOS 102  BILITOT 0.7  PROT 6.1*  ALBUMIN 2.5*     Antibiotics: Anti-infectives (From admission, onward)    Start     Dose/Rate Route Frequency Ordered Stop   05-18-2023 2200  cefadroxil (DURICEF) capsule 1,000 mg  Status:  Discontinued        1,000 mg Oral 2 times daily 05-18-23 1741 05-18-2023 1800   05/18/23 1800  cefadroxil (DURICEF) capsule 1,000 mg  Status:  Discontinued        1,000 mg Oral 2 times daily 05-18-23 1639 04/29/23 0753        DVT prophylaxis: SCDs  Code Status: DNR  Family Communication: Discussed with daughter at bedside   CONSULTS neurosurgery, CCM   Subjective    Lethargic this morning.  No answered questions appropriately.  Says that he slept well.  Objective    Physical Examination:  Surgery somnolent, opens eyes to sternal rub. Answered questions appropriately Abdomen is soft, nontender Extremities no edema  Status is: Inpatient:          [name]   [facility] If 7PM-7AM, please contact night-coverage at [email or web address], Office  [phone]   05/05/2023, 11:05 AM  LOS: 9 days

## 2023-05-05 NOTE — Evaluation (Signed)
Occupational Therapy Evaluation Patient Details Name: Kenneth Montoya MRN: [id number] DOB: Jun 11, 1940 Today's Date: 05/05/2023   History of Present Illness Pt is an 82 y.o. M admitted 11/30 after fall at [facility]- rehab with Diffuse SAH in the right sylvian fissure and suprasellar cisterns, Bil SDH and nondisplaced Rt paramedian frontal skull fx.  Pt became less responsive and transitioned to full comfort care.  Now more alert after being off medications and PT re-consulted to assess progress. PMhx: HTN, CAD, TIA, PVD, HLD, and SIADH with chronic hyponatremia, Rt TKA, bil reverse TSA. Rt THA  02/12/23 , fall 03/10/23 with nondisplaced fx of acetabulum with fluid in Rt quadriceps s/p I&D Rt hip 03/13/23.   Clinical Impression   Pt admitted with [facility] s/p fall at [facility]. Pt currently with functional limitations due to the deficits listed below (see OT Problem List). Pt currently on comfort care and OT has been ordered to assess pt's ability to participate in skilled OT and progress. Pt extremely limited by decreased activity tolerance and endurance requiring frequent rest breaks and increased physical assist to complete functional tasks. If comfort care is lifted, pt would benefit from skilled OT services to increase activity tolerance and endurance. Patient will benefit from continued inpatient follow up therapy, <3 hours/day.       If plan is discharge home, recommend the following: Two people to help with walking and/or transfers;Two people to help with bathing/dressing/bathroom;Assistance with feeding    Functional Status Assessment  Patient has had a recent decline in their functional status and demonstrates the ability to make significant improvements in function in a reasonable and predictable amount of time.  Equipment Recommendations  [employer];Wheelchair cushion (measurements OT);Hospital bed;Wheelchair (measurements OT)       Precautions / Restrictions  Precautions Precautions: Fall Restrictions Weight Bearing Restrictions: No      Mobility Bed Mobility Overal bed mobility: Needs Assistance Bed Mobility: Supine to Sit, Sit to Supine     Supine to sit: Max assist, HOB elevated, Used rails Sit to supine: Max assist, Used rails   General bed mobility comments: Pt attempted to assist some with bed mobility. althogh due to weakness required increased physical assist.    Transfers Overall transfer level: Needs assistance Equipment used: 2 person hand held assist (face to face technique) Transfers: Sit to/from Stand Sit to Stand: +2 safety/equipment, Mod assist, +2 physical assistance, From elevated surface  General transfer comment: Bed pad used to assist with sit to stand transition.      Balance Overall balance assessment: Needs assistance Sitting-balance support: Single extremity supported, Feet supported Sitting balance-Leahy Scale: Poor Sitting balance - Comments: sitting EOB. required Min to max A to maintain sitting balance, Physical assist provided for proper placement of BLE. Completed RUE reaching with mobility tech to help open up chest and increase overall sitting posture. Postural control: Posterior lean, Right lateral lean Standing balance support: Single extremity supported Standing balance-Leahy Scale: Zero Standing balance comment: +2 required       ADL either performed or assessed with clinical judgement   ADL   Eating/Feeding: Maximal assistance;Bed level;Cueing for sequencing;Cueing for safety   Grooming: Wash/dry face;Minimal assistance;Sitting   Upper Body Bathing: Total assistance;Bed level   Lower Body Bathing: Total assistance;+2 for physical assistance;Bed level   Upper Body Dressing : Total assistance;Bed level   Lower Body Dressing: Total assistance;+2 for physical assistance;Bed level         Vision Baseline Vision/History: 1 Wears glasses Ability to  See in Adequate Light: 0  Adequate Patient Visual Report:  (unable to answer) Additional Comments: Unable to assess vision due to cognition     Perception Perception: Impaired Preception Impairment Details: Inattention/Neglect Perception-Other Comments: L side neglect noted.   Praxis Praxis: Impaired Praxis Impairment Details: Motor planning, Organization     Pertinent Vitals/Pain Pain Assessment Pain Assessment: Faces Faces Pain Scale: Hurts little more Pain Location: generalized during bed mobility Pain Descriptors / Indicators: Grimacing, Discomfort Pain Intervention(s): Repositioned, Monitored during session, Limited activity within patient's tolerance     Extremity/Trunk Assessment Upper Extremity Assessment Upper Extremity Assessment: RUE deficits/detail;LUE deficits/detail RUE Deficits / Details: A/ROM WFL at bed level. Pt demonstrates age appropriate strength when assessed seated EOB. LUE Deficits / Details: Pt unable to demonstrate movement in LUE on command. Pt did demonstrate active LUE shoulder and elbow movedment spontaneously. Impaired gross grasp. LUE Coordination: decreased gross motor;decreased fine motor   Lower Extremity Assessment Lower Extremity Assessment: Defer to PT evaluation   Cervical / Trunk Assessment Cervical / Trunk Assessment: Kyphotic   Communication Communication Communication: Hearing impairment;Difficulty communicating thoughts/reduced clarity of speech;Difficulty following commands/understanding   Cognition Arousal: Lethargic Behavior During Therapy: Flat affect Overall Cognitive Status: Impaired/Different from baseline Area of Impairment: Following commands, Safety/judgement, Attention, Awareness, Problem solving, Memory, Orientation                 Orientation Level: Person, Time   Memory: Decreased short-term memory Following Commands: Follows one step commands inconsistently Safety/Judgement: Decreased awareness of safety, Decreased awareness of  deficits   Problem Solving: Slow processing, Requires verbal cues, Requires tactile cues, Difficulty sequencing, Decreased initiation General Comments: Pt pleasant and verbalized thanks to OT and mobility specialist during session.     General Comments  pt incontinent of BM. Was cleanred up during session and placed in chair position while in bed.            Home Living Family/patient expects to be discharged to:: Skilled nursing facility    Additional Comments: Per chart review, pt sleeps in recliner at home.      Prior Functioning/Environment Prior Level of Function : History of Falls (last six months)    Mobility Comments: Was living at home prior to THA using RW. Unknonw on level of assist at [facility]. ADLs Comments: Received assistance at SNF        OT Problem List: Decreased strength;Decreased cognition;Decreased range of motion;Decreased safety awareness;Decreased activity tolerance;Decreased knowledge of use of DME or AE;Impaired UE functional use;Impaired balance (sitting and/or standing);Decreased knowledge of precautions;Pain;Impaired vision/perception;Decreased coordination      OT Treatment/Interventions: Self-care/ADL training;Therapeutic activities;Neuromuscular education;Visual/perceptual remediation/compensation;Cognitive remediation/compensation;Therapeutic exercise;Energy conservation;Patient/family education;DME and/or AE instruction;Balance training;Manual therapy;Modalities;Splinting    OT Goals(Current goals can be found in the care plan section) Acute Rehab OT Goals Patient Stated Goal: none stated OT Goal Formulation: Patient unable to participate in goal setting Time For Goal Achievement: 05/19/23 Potential to Achieve Goals: Fair  OT Frequency: Min 1X/week       AM-PAC OT "6 Clicks" Daily Activity     Outcome Measure Help from another person eating meals?: Total Help from another person taking care of personal grooming?: A Lot Help from another person  toileting, which includes using toliet, bedpan, or urinal?: Total Help from another person bathing (including washing, rinsing, drying)?: Total Help from another person to put on and taking off regular upper body clothing?: Total Help from another person to put on and taking off regular lower body clothing?: Total 6 Click  Score: 7   End of Session Equipment Utilized During Treatment: Gait belt Nurse Communication: Mobility status  Activity Tolerance: Patient tolerated treatment well Patient left: in bed;with call bell/phone within reach;with bed alarm set;with nursing/sitter in room  OT Visit Diagnosis: Unsteadiness on feet (R26.81);Other symptoms and signs involving cognitive function;Hemiplegia and hemiparesis;Pain Hemiplegia - Right/Left: Left Hemiplegia - dominant/non-dominant: Non-Dominant Hemiplegia - caused by: Other cerebrovascular disease                Time: 1610-9604 OT Time Calculation (min): 41 min Charges:  OT General Charges $OT Visit: 1 Visit OT Evaluation $OT Eval Moderate Complexity: 1 Mod OT Treatments $Self Care/Home Management : 8-22 mins $Therapeutic Activity: 8-22 mins  Limmie Patricia, OTR/L,CBIS  Supplemental OT - [facility] and [facility] Secure Chat Preferred    Normalee Sistare, Charisse March 05/05/2023, 3:07 PM

## 2023-05-05 NOTE — Progress Notes (Signed)
Palliative:  HPI: 82 y.o. male  with past medical history of mild cognitive decline, recent THA in 01/2023, wound infection and likely septic arthritis after a fall causing injury to his incision in Oct 2024 who presented with confusion after a fall this morning.  admitted on 05/10/23 with traumatic SAH after fall traumatic bilateral subdural hemorrhage, new onset seizure, acute encephalopathy in the setting of TBI, and others.    Earlier today it appears patient's family has elected to transition to comfort care.  Palliative medicine was consulted for GOC conversations and end-of-life care.  Extensive chart review completed. I met today at "Kenneth Montoya's" bedside but no family present. Kenneth Montoya is very lethargic and only opens eyes slightly and briefly. He moves his arms and adjusts pillow under his head. He attempts verbal response but only brief mumble that I could not understand. RN reports worsening lethargy today. NT reports that he was more alert this morning and had applesauce and cranberry juice for her. He did receive morphine last night.   I called and discussed with daughter, Kenneth Montoya. Kenneth Montoya is understandably very emotional and torn on what to and how to advocate for her father. She has much on her plate trying to care for her mother who has dementia. She is unclear on the direction and expectations for her father from the medical team. I discussed with Kenneth Montoya the roller-coaster of her father's illness and the medical team waiting for him to "declare himself" and give [name] the signs we need to better advise her on best next step forward. We agree that his overall prognosis is poor. Kenneth Montoya does not want him to go to a facility - she knows that he is unable to rehab and would not desire facility. She expresses concern that she does not really want him in hospice facility either. We did discuss the pros vs cons of hospice support at home. We agree to think on options and reassess and discuss again tomorrow. Kenneth Montoya  would like SLP follow up to provide more information and input to assist with decisions. Time allowed for Kenneth Montoya to express her frustrations and fears for the difficult position that they find themselves with her father's health decline.   I discussed with SLP who plans to follow up tomorrow. I discussed with Dr. Sharl Ma and we will reassess progress and discuss again tomorrow morning to better advise Kenneth Montoya on our recommendations for path forward.   All questions/concerns addressed to best of my ability. Emotional support provided.   Exam: Very lethargic. Unable to provide appropriate verbal response. Not following commands. Purposeful movement noted. No distress. Breathing regular, unlabored. Abd soft. Warm to touch.   Plan: - DNR - Time for outcomes - I agree with continuing comfort focused care as well as po medications as tolerated - Reassess tomorrow for recommendations for path forward  55 min  Yong Channel, NP Palliative Medicine Team Pager [phone] (Please see [email or web address] for schedule) Team Phone [phone]    Greater than 50%  of this time was spent counseling and coordinating care related to the above assessment and plan

## 2023-05-06 ENCOUNTER — Ambulatory Visit: Payer: Medicare Other | Admitting: Internal Medicine

## 2023-05-06 DIAGNOSIS — S0636AA Traumatic hemorrhage of cerebrum, unspecified, with loss of consciousness status unknown, initial encounter: Secondary | ICD-10-CM | POA: Diagnosis not present

## 2023-05-06 DIAGNOSIS — I609 Nontraumatic subarachnoid hemorrhage, unspecified: Secondary | ICD-10-CM | POA: Diagnosis not present

## 2023-05-06 DIAGNOSIS — Z7189 Other specified counseling: Secondary | ICD-10-CM | POA: Diagnosis not present

## 2023-05-06 DIAGNOSIS — Z515 Encounter for palliative care: Secondary | ICD-10-CM | POA: Diagnosis not present

## 2023-05-06 LAB — CBC
HCT: 29.2 % — ABNORMAL LOW (ref 39.0–52.0)
Hemoglobin: 9.2 g/dL — ABNORMAL LOW (ref 13.0–17.0)
MCH: 26.1 pg (ref 26.0–34.0)
MCHC: 31.5 g/dL (ref 30.0–36.0)
MCV: 83 fL (ref 80.0–100.0)
Platelets: 342 10*3/uL (ref 150–400)
RBC: 3.52 MIL/uL — ABNORMAL LOW (ref 4.22–5.81)
RDW: 16.7 % — ABNORMAL HIGH (ref 11.5–15.5)
WBC: 11.6 10*3/uL — ABNORMAL HIGH (ref 4.0–10.5)
nRBC: 0 % (ref 0.0–0.2)

## 2023-05-06 LAB — COMPREHENSIVE METABOLIC PANEL
ALT: 82 U/L — ABNORMAL HIGH (ref 0–44)
AST: 80 U/L — ABNORMAL HIGH (ref 15–41)
Albumin: 2.2 g/dL — ABNORMAL LOW (ref 3.5–5.0)
Alkaline Phosphatase: 101 U/L (ref 38–126)
Anion gap: 8 (ref 5–15)
BUN: 18 mg/dL (ref 8–23)
CO2: 26 mmol/L (ref 22–32)
Calcium: 8.5 mg/dL — ABNORMAL LOW (ref 8.9–10.3)
Chloride: 101 mmol/L (ref 98–111)
Creatinine, Ser: 1.05 mg/dL (ref 0.61–1.24)
GFR, Estimated: >60 mL/min (ref >60)
Glucose, Bld: 116 mg/dL — ABNORMAL HIGH (ref 70–99)
Potassium: 4.4 mmol/L (ref 3.5–5.1)
Sodium: 135 mmol/L (ref 135–145)
Total Bilirubin: 0.8 mg/dL (ref <1.2)
Total Protein: 5.7 g/dL — ABNORMAL LOW (ref 6.5–8.1)

## 2023-05-06 MED ORDER — GLYCOPYRROLATE 0.2 MG/ML IJ SOLN: 0.4000 mg | INTRAMUSCULAR | Status: DC | PRN

## 2023-05-06 MED ORDER — POLYVINYL ALCOHOL 1.4 % OP SOLN: 1.0000 [drp] | Freq: Four times a day (QID) | OPHTHALMIC | Status: DC | PRN

## 2023-05-06 MED ORDER — HALOPERIDOL LACTATE 5 MG/ML IJ SOLN: 0.5000 mg | INTRAMUSCULAR | Status: DC | PRN

## 2023-05-06 MED ORDER — HALOPERIDOL 0.5 MG PO TABS: 0.5000 mg | ORAL_TABLET | ORAL | Status: DC | PRN

## 2023-05-06 MED ORDER — BIOTENE DRY MOUTH MT LIQD: 15.0000 mL | OROMUCOSAL | Status: DC | PRN

## 2023-05-06 MED ORDER — LORAZEPAM 2 MG/ML IJ SOLN: 2.0000 mg | INTRAMUSCULAR | Status: DC | PRN

## 2023-05-06 MED ORDER — GLYCOPYRROLATE 0.2 MG/ML IJ SOLN
0.4000 mg | Freq: Three times a day (TID) | INTRAMUSCULAR | Status: DC
  Administered 2023-05-06 – 2023-05-07 (×5): 0.4 mg via INTRAVENOUS
  Filled 2023-05-06 (×5): qty 2

## 2023-05-06 MED ORDER — LEVETIRACETAM IN NACL 1000 MG/100ML IV SOLN
1000.0000 mg | Freq: Two times a day (BID) | INTRAVENOUS | Status: DC
  Administered 2023-05-06 – 2023-05-07 (×4): 1000 mg via INTRAVENOUS
  Filled 2023-05-06 (×6): qty 100

## 2023-05-06 MED ORDER — HALOPERIDOL LACTATE 2 MG/ML PO CONC: 0.5000 mg | ORAL | Status: DC | PRN

## 2023-05-06 NOTE — Progress Notes (Signed)
Palliative:  HPI: HPI: 82 y.o. male  with past medical history of mild cognitive decline, recent THA in 01/2023, wound infection and likely septic arthritis after a fall causing injury to his incision in Oct 2024 who presented with confusion after a fall this morning.  admitted on 04/26/2023 with traumatic SAH after fall traumatic bilateral subdural hemorrhage, new onset seizure, acute encephalopathy in the setting of TBI, and others. Palliative medicine was consulted for GOC conversations and end-of-life care.  I met today at Bill's bedside but no family/visitors present. Discussed with NT and RN. He appears declined from yesterday. Low grade fever overnight and fever checked and 100.7 oral now. I had discussed with Dr. Sharl Ma about status and recommendations. Due to his significant decline we agree that we anticipate hospital death.   I called and discussed with daughter, [name]. I discussed with [name] my assessment and her father's decline. We discussed that he is not eating and Dr. Sharl Ma and I agree he is likely continuing to aspirate given his fever and decline. I discussed with [name] that I believe that his time is limited and I recommend full comfort care. I recommended that we provide full comfort care here in the hospital and that we can anticipate hospital death. [name] is appropriately tearful but also somewhat relieved by recommendation to remain hospitalized. She is trying to honor his wishes of not pursuing facility like SNF or even hospice but not able to manage his care at home (where he would be with his wife who has dementia).   [name] very much wishes for him to remain here and for the hospital to allow him a peaceful end of life. We discussed utilizing IV for all medications that will provide him comfort. We discussed anticipation that he is unlikely to eat/drink. [name] and I discuss her mother and she reports that her mother did tell her that she would not be surprised to get a call at anytime  that [name] has died. I advised [name] to leave it up to her mother if she wishes for further visits (she did not want to come yesterday). [name] will try and visit when caregiver available to be with her mother. We did discuss that unlikely for anything to happen today but high risk for further decline and death over the coming days. She would like to be notified of any changes in status - discussed with RN.   All questions/concerns addressed. Emotional support provided. Discussed with Dr. Sharl Ma, RN, TOC.   Exam: Lethargic. Breathing hard and tachypnea but no distress. Lips cyanotic. Warm to touch - low grade fever noted. Struggling with any verbal response but following commands. Opens eyes briefly and slightly to command. Attempts cough when requested but extremely weak and ineffective. Not alert enough to attempt breakfast tray at bedside.   Plan: - DNR - Full comfort care - Anticipate hospital death  60 min  Yong Channel, NP Palliative Medicine Team Pager [phone] (Please see [email or web address] for schedule) Team Phone [phone]    Greater than 50%  of this time was spent counseling and coordinating care related to the above assessment and plan

## 2023-05-06 NOTE — Progress Notes (Signed)
PT Cancellation Note  Patient Details Name: Kenneth Montoya MRN: [id number] DOB: [birth year]   Cancelled Treatment:    Reason Eval/Treat Not Completed: (P) Medical issues which prohibited therapy, per RN pt not appropriate for PT session, transitioning to comfort care/hospice. Please re-consult if appropriate.   Lenora Boys. PTA Acute Rehabilitation Services Office: [phone]    Catalina Antigua 05/06/2023, 1:27 PM

## 2023-05-06 NOTE — Progress Notes (Signed)
[facility]  PROGRESS NOTE  Kenneth Montoya ZHY:[id number] DOB: Mar 25, 1941 DOA: 04/26/2023 PCP: Kenneth Grandchild, MD   Brief HPI:    82 y/o gentleman with a history of mild cognitive decline, recent THA in 01/2023, wound infection and likely septic arthritis after a fall causing injury to his incision in Oct 2024 who presented with confusion after a fall this morning. He had an unwitnessed fall around 5:45 this morning at his rehab, and family visiting around 11AM felt that he was not acting normally and noticed the injury on the back of his head. They had him brought to the ED where he was found to have a SAH. He fell about a week ago as well, but no known injuries from that fall. He has previously been on plavix. He had a headache last night per his daughter, but otherwise has not had complaints. He is now more confused than his baseline per daughter. He completed his planned IV course of cefazolin on 11/28 and had his PICC removed on 11/29. He was started on cefadroxil suppressive therapy as planned after completion of his IV course of antibiotics. His family had been working on a safe discharge plan for him this week with some difficulty-- their mother has cognitive impairment and his daughter and the SW do not feel that Kenneth Montoya could safely be at home caring for himself. He has felt pretty strongly that he wants to go home and told his daughter he does not ever want to live in a facility.       Assessment/Plan:   Traumatic SAH status post fall -Initially seen by neurosurgery, patient was encephalopathic at that time -Traumatic bifrontal contusion, SDH -Was not felt to be a surgical candidate -He is more awake now, called and discussed with Kenneth Montoya, neurosurgeon on-call -As per Kenneth Montoya if patient is improving he does not need any further workup including imaging or surgical intervention -If he starts to decline then neurosurgery can be reconsulted -At this time patient is made  full comfort care  Acute encephalopathy -Likely in setting of traumatic brain injury and aspiration, patient had been coughing with liquid intake -Patient is not responsive at all to verbal stimuli or sternal rub   Seizure, new onset -Insetting of above -Patient was made comfort care -Will continue with Keppra 1000 mg twice daily -Continue Ativan as needed  Acute encephalopathy in setting of traumatic brain injury -Resolved -Initially patient was lethargic, felt to be postictal state -He became more alert and started communicating  on 05/01/2023  Hypokalemia -Replete   Recent septic arthritis of prosthetic hip -Completed antibiotics  Acute urinary retention due to BPH: Complicated Foley placement requiring urology assistance 12/1  -Foley catheter reinserted   Dysphagia -Patient woke up this morning and family wants to check for swallowing --Swallow evaluation obtained -Started on pured diet with nectar thick liquid; will advance diet as tolerated   Goals of care -Patient was made full comfort care in the ICU and was transferred to floor on his medications were stopped -Patient initially was encephalopathic, after coming to medical floor he has become more alert and started communicating.  Family is not sure whether we should keep him is full comfort care only versus treat aggressively.  I discussed with patient's daughter Kenneth Montoya, and explained that this could be a temporary phase of improvement.  Swallow elevation obtained, patient started on pured diet with nectar thick liquid.  However patient became unresponsive this morning.  Made full comfort care.  Palliative care following  -Discussed with palliative care, patient is a poor candidate for any aggressive treatments, has clearly declined from yesterday -At this time would recommend to make him full comfort care with anticipation of hospital death -Patient's daughter was informed of this recommendation by palliative  care   Medications     (feeding supplement) PROSource Plus  30 mL Oral BID BM   feeding supplement  237 mL Oral BID BM   guaiFENesin  15 mL Oral TID   hydrocerin  1 Application Topical BID   levETIRAcetam  1,000 mg Oral BID   multivitamin with minerals  1 tablet Oral Daily   olopatadine  1 drop Both Eyes QHS   tamsulosin  0.4 mg Oral Daily     Data Reviewed:   CBG:  No results for input(s): "GLUCAP" in the last 168 hours.   SpO2: 96 % O2 Flow Rate (L/min): 2 L/min    Vitals:   05/05/23 1000 05/05/23 1930 05/06/23 0342 05/06/23 0922  BP: (!) 144/75 (!) 120/94  (!) 142/74  Pulse: (!) 107   (!) 101  Resp: 18   20  Temp: 99.4 F (37.4 C) 99.3 F (37.4 C)  (!) 100.7 F (38.2 C)  TempSrc:  Oral  Oral  SpO2: 95% 94%  96%  Weight:   83.2 kg   Height:          Data Reviewed:  Basic Metabolic Panel: Recent Labs  Lab 05/03/23 0417 05/04/23 0642 05/06/23 0635  NA 141 142 135  K 3.1* 4.2 4.4  CL 103 106 101  CO2 29 25 26   GLUCOSE 129* 127* 116*  BUN 25* 19 18  CREATININE 1.20 1.27* 1.05  CALCIUM 8.3* 8.6* 8.5*    CBC: Recent Labs  Lab 05/03/23 0417 05/06/23 0635  WBC 11.1* 11.6*  HGB 10.1* 9.2*  HCT 32.8* 29.2*  MCV 84.3 83.0  PLT 270 342    LFT Recent Labs  Lab 05/03/23 0417 05/06/23 0635  AST 61* 80*  ALT 43 82*  ALKPHOS 102 101  BILITOT 0.7 0.8  PROT 6.1* 5.7*  ALBUMIN 2.5* 2.2*     Antibiotics: Anti-infectives (From admission, onward)    Start     Dose/Rate Route Frequency Ordered Stop   18-May-2023 2200  cefadroxil (DURICEF) capsule 1,000 mg  Status:  Discontinued        1,000 mg Oral 2 times daily 05/18/23 1741 2023/05/18 1800   05/18/23 1800  cefadroxil (DURICEF) capsule 1,000 mg  Status:  Discontinued        1,000 mg Oral 2 times daily May 18, 2023 1639 04/29/23 0753        DVT prophylaxis: SCDs  Code Status: DNR  Family Communication: Discussed with daughter at bedside   CONSULTS neurosurgery, CCM   Subjective   Has  been lethargic this morning.   Objective    Physical Examination:  Unresponsive, does not open eyes to verbal or sternal rub Lungs clear to auscultation bilaterally S1-S2, regular  Status is: Inpatient:          Kenneth Montoya   [facility] If 7PM-7AM, please contact night-coverage at [email or web address], Office  [phone]   05/06/2023, 10:23 AM  LOS: 10 days

## 2023-05-06 NOTE — TOC Progression Note (Signed)
Transition of Care [facility] - Progression Note    Patient Details  Name: Kenneth Montoya MRN: [id number] Date of Birth: 01/14/1941  [facility] of [facility]) CM/SW Contact  Carley Hammed, LCSW Phone Number: 05/06/2023, 12:34 PM  Clinical Narrative:     TOC continues to follow and are available for any ongoing needs. Palliative following and will advise TOC of disposition needs, appreciate their expertise. Please consult for any additional needs that might arise.       Expected Discharge Plan and Services                                               Social Determinants of Health (SDOH) Interventions SDOH Screenings   Food Insecurity: Patient Unable To Answer (04/29/2023)  Housing: Patient Unable To Answer (04/29/2023)  Transportation Needs: Patient Unable To Answer (04/29/2023)  Utilities: Patient Unable To Answer (04/29/2023)  Alcohol Screen: Low Risk  (03/04/2022)  Depression (PHQ2-9): Low Risk  (03/06/2023)  Financial Resource Strain: Low Risk  (03/06/2023)  Physical Activity: Inactive (03/06/2023)  Social Connections: Socially Integrated (03/06/2023)  Stress: No Stress Concern Present (03/06/2023)  Tobacco Use: Low Risk  (04/30/2023)  Health Literacy: Adequate Health Literacy (03/06/2023)    Readmission Risk Interventions     No data to display

## 2023-05-06 NOTE — Progress Notes (Signed)
Spoke with patient's daughter, Berline Chough at the bedside.  The daughter stated she was the Patient's power of attorney and provided the correct POA documentation to verify.  I made 2 copies of the documentation and placed one in the patient's shadow chart and sent the other copy to medical records.  The patient's daughter took the patient's cell phone home with her when she left today, after visiting with the patient.

## 2023-05-06 NOTE — Plan of Care (Signed)
  Problem: Education: Goal: Ability to describe self-care measures that may prevent or decrease complications (Diabetes Survival Skills Education) will improve Outcome: Not Progressing Goal: Individualized Educational Video(s) Outcome: Not Progressing   Problem: Coping: Goal: Ability to adjust to condition or change in health will improve Outcome: Not Progressing   Problem: Fluid Volume: Goal: Ability to maintain a balanced intake and output will improve Outcome: Not Progressing   Problem: Health Behavior/Discharge Planning: Goal: Ability to identify and utilize available resources and services will improve Outcome: Not Progressing Goal: Ability to manage health-related needs will improve Outcome: Not Progressing   Problem: Metabolic: Goal: Ability to maintain appropriate glucose levels will improve Outcome: Not Progressing   Problem: Nutritional: Goal: Maintenance of adequate nutrition will improve Outcome: Not Progressing Goal: Progress toward achieving an optimal weight will improve Outcome: Not Progressing   Problem: Skin Integrity: Goal: Risk for impaired skin integrity will decrease Outcome: Not Progressing   Problem: Tissue Perfusion: Goal: Adequacy of tissue perfusion will improve Outcome: Not Progressing   Problem: Education: Goal: Knowledge of General Education information will improve Description: Including pain rating scale, medication(s)/side effects and non-pharmacologic comfort measures Outcome: Not Progressing   Problem: Health Behavior/Discharge Planning: Goal: Ability to manage health-related needs will improve Outcome: Not Progressing   Problem: Clinical Measurements: Goal: Ability to maintain clinical measurements within normal limits will improve Outcome: Not Progressing Goal: Will remain free from infection Outcome: Not Progressing Goal: Diagnostic test results will improve Outcome: Not Progressing Goal: Respiratory complications will  improve Outcome: Not Progressing Goal: Cardiovascular complication will be avoided Outcome: Not Progressing   Problem: Activity: Goal: Risk for activity intolerance will decrease Outcome: Not Progressing   Problem: Nutrition: Goal: Adequate nutrition will be maintained Outcome: Not Progressing   Problem: Coping: Goal: Level of anxiety will decrease Outcome: Not Progressing   Problem: Elimination: Goal: Will not experience complications related to bowel motility Outcome: Not Progressing Goal: Will not experience complications related to urinary retention Outcome: Not Progressing   Problem: Pain Management: Goal: General experience of comfort will improve Outcome: Not Progressing   Problem: Safety: Goal: Ability to remain free from injury will improve Outcome: Not Progressing   Problem: Skin Integrity: Goal: Risk for impaired skin integrity will decrease Outcome: Not Progressing   Problem: Education: Goal: Knowledge of the prescribed therapeutic regimen will improve Outcome: Not Progressing   Problem: Coping: Goal: Ability to identify and develop effective coping behavior will improve Outcome: Not Progressing   Problem: Clinical Measurements: Goal: Quality of life will improve Outcome: Not Progressing   Problem: Respiratory: Goal: Verbalizations of increased ease of respirations will increase Outcome: Not Progressing   Problem: Role Relationship: Goal: Family's ability to cope with current situation will improve Outcome: Not Progressing Goal: Ability to verbalize concerns, feelings, and thoughts to partner or family member will improve Outcome: Not Progressing   Problem: Pain Management: Goal: Satisfaction with pain management regimen will improve Outcome: Not Progressing

## 2023-05-07 ENCOUNTER — Other Ambulatory Visit: Payer: Self-pay | Admitting: Internal Medicine

## 2023-05-07 DIAGNOSIS — S0636AD Traumatic hemorrhage of cerebrum, unspecified, with loss of consciousness status unknown, subsequent encounter: Secondary | ICD-10-CM

## 2023-05-07 DIAGNOSIS — I609 Nontraumatic subarachnoid hemorrhage, unspecified: Secondary | ICD-10-CM | POA: Diagnosis not present

## 2023-05-07 DIAGNOSIS — Z515 Encounter for palliative care: Secondary | ICD-10-CM | POA: Diagnosis not present

## 2023-05-07 DIAGNOSIS — R0603 Acute respiratory distress: Secondary | ICD-10-CM | POA: Diagnosis not present

## 2023-05-07 DIAGNOSIS — I10 Essential (primary) hypertension: Secondary | ICD-10-CM

## 2023-05-07 DIAGNOSIS — Z7189 Other specified counseling: Secondary | ICD-10-CM | POA: Diagnosis not present

## 2023-05-07 MED ORDER — LORAZEPAM 2 MG/ML IJ SOLN
1.0000 mg | INTRAMUSCULAR | Status: DC | PRN
  Administered 2023-05-07: 2 mg via INTRAVENOUS
  Filled 2023-05-07: qty 1

## 2023-05-07 MED ORDER — HALOPERIDOL LACTATE 2 MG/ML PO CONC
5.0000 mg | ORAL | Status: DC
  Filled 2023-05-07: qty 5

## 2023-05-07 MED ORDER — LORAZEPAM 2 MG/ML IJ SOLN: 2.0000 mg | Freq: Once | INTRAMUSCULAR | Status: AC

## 2023-05-07 MED ORDER — LORAZEPAM 2 MG/ML IJ SOLN: 1.0000 mg | INTRAMUSCULAR | Status: DC | PRN

## 2023-05-07 MED ORDER — MORPHINE SULFATE (PF) 2 MG/ML IV SOLN
1.0000 mg | INTRAVENOUS | Status: DC | PRN
  Administered 2023-05-07: 2 mg via INTRAVENOUS
  Filled 2023-05-07: qty 1

## 2023-05-07 MED ORDER — LORAZEPAM 2 MG/ML IJ SOLN
1.0000 mg | INTRAMUSCULAR | Status: DC
  Administered 2023-05-07: 1 mg via INTRAVENOUS
  Filled 2023-05-07: qty 1

## 2023-05-07 MED ORDER — MORPHINE BOLUS VIA INFUSION
4.0000 mg | INTRAVENOUS | Status: DC | PRN
  Administered 2023-05-07 (×2): 4 mg via INTRAVENOUS

## 2023-05-07 MED ORDER — MORPHINE 100MG IN NS 100ML (1MG/ML) PREMIX INFUSION
4.0000 mg/h | INTRAVENOUS | Status: DC
  Administered 2023-05-07: 2 mg/h via INTRAVENOUS
  Administered 2023-05-08: 4 mg/h via INTRAVENOUS
  Filled 2023-05-07 (×2): qty 100

## 2023-05-07 MED ORDER — LORAZEPAM 2 MG/ML IJ SOLN
2.0000 mg | INTRAMUSCULAR | Status: DC
  Administered 2023-05-07 – 2023-05-08 (×3): 4 mg via INTRAVENOUS
  Administered 2023-05-08: 2 mg via INTRAVENOUS
  Filled 2023-05-07 (×4): qty 2

## 2023-05-07 MED ORDER — LORAZEPAM 2 MG/ML IJ SOLN: 1.0000 mg | Freq: Four times a day (QID) | INTRAMUSCULAR | Status: DC

## 2023-05-07 MED ORDER — HALOPERIDOL LACTATE 5 MG/ML IJ SOLN
2.0000 mg | Freq: Four times a day (QID) | INTRAMUSCULAR | Status: DC | PRN
  Administered 2023-05-07: 2 mg via INTRAVENOUS
  Filled 2023-05-07 (×2): qty 1

## 2023-05-07 MED ORDER — MORPHINE SULFATE (PF) 2 MG/ML IV SOLN: 1.0000 mg | Freq: Three times a day (TID) | INTRAVENOUS | Status: DC

## 2023-05-07 MED ORDER — HYDROMORPHONE HCL 1 MG/ML IJ SOLN
1.0000 mg | INTRAMUSCULAR | Status: AC
  Administered 2023-05-07: 1 mg via INTRAVENOUS
  Filled 2023-05-07: qty 1

## 2023-05-07 MED ORDER — DIPHENHYDRAMINE HCL 50 MG/ML IJ SOLN
25.0000 mg | Freq: Four times a day (QID) | INTRAMUSCULAR | Status: DC | PRN
  Administered 2023-05-07: 25 mg via INTRAVENOUS
  Filled 2023-05-07: qty 1

## 2023-05-07 MED ORDER — MORPHINE BOLUS VIA INFUSION
5.0000 mg | Freq: Once | INTRAVENOUS | Status: AC
  Administered 2023-05-07: 5 mg via INTRAVENOUS
  Filled 2023-05-07: qty 5

## 2023-05-07 MED ORDER — LORAZEPAM 2 MG/ML IJ SOLN: 1.0000 mg | INTRAMUSCULAR | Status: DC

## 2023-05-07 MED ORDER — LORAZEPAM 2 MG/ML IJ SOLN: 2.0000 mg | INTRAMUSCULAR | Status: DC | PRN

## 2023-05-07 MED ORDER — KETOROLAC TROMETHAMINE 15 MG/ML IJ SOLN
15.0000 mg | Freq: Four times a day (QID) | INTRAMUSCULAR | Status: DC
  Administered 2023-05-07 – 2023-05-08 (×3): 15 mg via INTRAVENOUS
  Filled 2023-05-07 (×3): qty 1

## 2023-05-07 MED ORDER — MORPHINE SULFATE (PF) 2 MG/ML IV SOLN: 1.0000 mg | INTRAVENOUS | Status: DC | PRN

## 2023-05-07 NOTE — Plan of Care (Signed)
Problem: Education: Goal: Ability to describe self-care measures that may prevent or decrease complications (Diabetes Survival Skills Education) will improve 05/07/2023 2024 by Kelli Hope, RN Outcome: Not Progressing 05/07/2023 2024 by Kelli Hope, RN Outcome: Not Progressing Goal: Individualized Educational Video(s) 05/07/2023 2024 by Kelli Hope, RN Outcome: Not Progressing 05/07/2023 2024 by Kelli Hope, RN Outcome: Not Progressing   Problem: Coping: Goal: Ability to adjust to condition or change in health will improve 05/07/2023 2024 by Kelli Hope, RN Outcome: Not Progressing 05/07/2023 2024 by Kelli Hope, RN Outcome: Not Progressing   Problem: Fluid Volume: Goal: Ability to maintain a balanced intake and output will improve 05/07/2023 2024 by Kelli Hope, RN Outcome: Not Progressing 05/07/2023 2024 by Kelli Hope, RN Outcome: Not Progressing   Problem: Health Behavior/Discharge Planning: Goal: Ability to identify and utilize available resources and services will improve 05/07/2023 2024 by Kelli Hope, RN Outcome: Not Progressing 05/07/2023 2024 by Kelli Hope, RN Outcome: Not Progressing Goal: Ability to manage health-related needs will improve 05/07/2023 2024 by Kelli Hope, RN Outcome: Not Progressing 05/07/2023 2024 by Kelli Hope, RN Outcome: Not Progressing   Problem: Metabolic: Goal: Ability to maintain appropriate glucose levels will improve 05/07/2023 2024 by Kelli Hope, RN Outcome: Not Progressing 05/07/2023 2024 by Kelli Hope, RN Outcome: Not Progressing   Problem: Nutritional: Goal: Maintenance of adequate nutrition will improve 05/07/2023 2024 by Kelli Hope, RN Outcome: Not Progressing 05/07/2023 2024 by Kelli Hope, RN Outcome: Not Progressing Goal: Progress toward achieving an optimal weight will improve 05/07/2023 2024 by Kelli Hope, RN Outcome: Not  Progressing 05/07/2023 2024 by Kelli Hope, RN Outcome: Not Progressing   Problem: Skin Integrity: Goal: Risk for impaired skin integrity will decrease 05/07/2023 2024 by Kelli Hope, RN Outcome: Not Progressing 05/07/2023 2024 by Kelli Hope, RN Outcome: Not Progressing   Problem: Tissue Perfusion: Goal: Adequacy of tissue perfusion will improve 05/07/2023 2024 by Kelli Hope, RN Outcome: Not Progressing 05/07/2023 2024 by Kelli Hope, RN Outcome: Not Progressing   Problem: Education: Goal: Knowledge of General Education information will improve Description: Including pain rating scale, medication(s)/side effects and non-pharmacologic comfort measures 05/07/2023 2024 by Kelli Hope, RN Outcome: Not Progressing 05/07/2023 2024 by Kelli Hope, RN Outcome: Not Progressing   Problem: Health Behavior/Discharge Planning: Goal: Ability to manage health-related needs will improve 05/07/2023 2024 by Kelli Hope, RN Outcome: Not Progressing 05/07/2023 2024 by Kelli Hope, RN Outcome: Not Progressing   Problem: Clinical Measurements: Goal: Ability to maintain clinical measurements within normal limits will improve 05/07/2023 2024 by Kelli Hope, RN Outcome: Not Progressing 05/07/2023 2024 by Kelli Hope, RN Outcome: Not Progressing Goal: Will remain free from infection 05/07/2023 2024 by Kelli Hope, RN Outcome: Not Progressing 05/07/2023 2024 by Kelli Hope, RN Outcome: Not Progressing Goal: Diagnostic test results will improve 05/07/2023 2024 by Kelli Hope, RN Outcome: Not Progressing 05/07/2023 2024 by Kelli Hope, RN Outcome: Not Progressing Goal: Respiratory complications will improve 05/07/2023 2024 by Kelli Hope, RN Outcome: Not Progressing 05/07/2023 2024 by Kelli Hope, RN Outcome: Not Progressing Goal: Cardiovascular complication will be avoided 05/07/2023 2024 by Kelli Hope, RN Outcome: Not Progressing 05/07/2023 2024 by Kelli Hope, RN Outcome: Not Progressing   Problem: Activity: Goal: Risk for activity intolerance will decrease 05/07/2023 2024 by Kelli Hope, RN Outcome: Not Progressing 05/07/2023  2024 by Kelli Hope, RN Outcome: Not Progressing   Problem: Nutrition: Goal: Adequate nutrition will be maintained 05/07/2023 2024 by Kelli Hope, RN Outcome: Not Progressing 05/07/2023 2024 by Kelli Hope, RN Outcome: Not Progressing   Problem: Coping: Goal: Level of anxiety will decrease 05/07/2023 2024 by Kelli Hope, RN Outcome: Not Progressing 05/07/2023 2024 by Kelli Hope, RN Outcome: Not Progressing   Problem: Elimination: Goal: Will not experience complications related to bowel motility 05/07/2023 2024 by Kelli Hope, RN Outcome: Not Progressing 05/07/2023 2024 by Kelli Hope, RN Outcome: Not Progressing Goal: Will not experience complications related to urinary retention 05/07/2023 2024 by Kelli Hope, RN Outcome: Not Progressing 05/07/2023 2024 by Kelli Hope, RN Outcome: Not Progressing   Problem: Pain Management: Goal: General experience of comfort will improve 05/07/2023 2024 by Kelli Hope, RN Outcome: Not Progressing 05/07/2023 2024 by Kelli Hope, RN Outcome: Not Progressing   Problem: Safety: Goal: Ability to remain free from injury will improve 05/07/2023 2024 by Kelli Hope, RN Outcome: Not Progressing 05/07/2023 2024 by Kelli Hope, RN Outcome: Not Progressing   Problem: Skin Integrity: Goal: Risk for impaired skin integrity will decrease 05/07/2023 2024 by Kelli Hope, RN Outcome: Not Progressing 05/07/2023 2024 by Kelli Hope, RN Outcome: Not Progressing   Problem: Education: Goal: Knowledge of the prescribed therapeutic regimen will improve 05/07/2023 2024 by Kelli Hope, RN Outcome: Not  Progressing 05/07/2023 2024 by Kelli Hope, RN Outcome: Not Progressing   Problem: Coping: Goal: Ability to identify and develop effective coping behavior will improve 05/07/2023 2024 by Kelli Hope, RN Outcome: Not Progressing 05/07/2023 2024 by Kelli Hope, RN Outcome: Not Progressing   Problem: Clinical Measurements: Goal: Quality of life will improve 05/07/2023 2024 by Kelli Hope, RN Outcome: Not Progressing 05/07/2023 2024 by Kelli Hope, RN Outcome: Not Progressing   Problem: Respiratory: Goal: Verbalizations of increased ease of respirations will increase 05/07/2023 2024 by Kelli Hope, RN Outcome: Not Progressing 05/07/2023 2024 by Kelli Hope, RN Outcome: Not Progressing   Problem: Role Relationship: Goal: Family's ability to cope with current situation will improve 05/07/2023 2024 by Kelli Hope, RN Outcome: Not Progressing 05/07/2023 2024 by Kelli Hope, RN Outcome: Not Progressing Goal: Ability to verbalize concerns, feelings, and thoughts to partner or family member will improve 05/07/2023 2024 by Kelli Hope, RN Outcome: Not Progressing 05/07/2023 2024 by Kelli Hope, RN Outcome: Not Progressing   Problem: Pain Management: Goal: Satisfaction with pain management regimen will improve 05/07/2023 2024 by Kelli Hope, RN Outcome: Not Progressing 05/07/2023 2024 by Kelli Hope, RN Outcome: Not Progressing   Problem: Education: Goal: Knowledge of the prescribed therapeutic regimen will improve 05/07/2023 2024 by Kelli Hope, RN Outcome: Not Progressing 05/07/2023 2024 by Kelli Hope, RN Outcome: Not Progressing   Problem: Coping: Goal: Ability to identify and develop effective coping behavior will improve 05/07/2023 2024 by Kelli Hope, RN Outcome: Not Progressing 05/07/2023 2024 by Kelli Hope, RN Outcome: Not Progressing   Problem: Clinical Measurements: Goal:  Quality of life will improve 05/07/2023 2024 by Kelli Hope, RN Outcome: Not Progressing 05/07/2023 2024 by Kelli Hope, RN Outcome: Not Progressing   Problem: Respiratory: Goal: Verbalizations of increased ease of respirations will increase 05/07/2023 2024 by Kelli Hope, RN Outcome: Not Progressing 05/07/2023 2024 by Kelli Hope, RN Outcome: Not Progressing  Problem: Role Relationship: Goal: Family's ability to cope with current situation will improve 05/07/2023 2024 by Kelli Hope, RN Outcome: Not Progressing 05/07/2023 2024 by Kelli Hope, RN Outcome: Not Progressing Goal: Ability to verbalize concerns, feelings, and thoughts to partner or family member will improve 05/07/2023 2024 by Kelli Hope, RN Outcome: Not Progressing 05/07/2023 2024 by Kelli Hope, RN Outcome: Not Progressing   Problem: Pain Management: Goal: Satisfaction with pain management regimen will improve 05/07/2023 2024 by Kelli Hope, RN Outcome: Not Progressing 05/07/2023 2024 by Kelli Hope, RN Outcome: Not Progressing   Patient is comfort care

## 2023-05-07 NOTE — Progress Notes (Signed)
[facility]                                                                               Kenneth Montoya, is a 82 y.o. male, DOB - 06/15/1940, ZOX:[id number] Admit date - 04/26/2023    Outpatient Primary MD for the patient is Etta Grandchild, MD  LOS - 11  days    Brief summary   82 y/o gentleman with a history of mild cognitive decline, recent THA in 01/2023, wound infection and likely septic arthritis after a fall causing injury to his incision in Oct 2024 who presented with confusion after a fall this morning. He had an unwitnessed fall around 5:45 this morning at his rehab, and family visiting around 11AM felt that he was not acting normally and noticed the injury on the back of his head. They had him brought to the ED where he was found to have a SAH. Marland Kitchen His family had been working on a safe discharge plan for him this week with some difficulty-- their mother has cognitive impairment and his daughter and the SW do not feel that Kenneth Montoya could safely be at home caring for himself.    Assessment & Plan    Assessment and Plan:   Traumatic subarachnoid hemorrhage s/p fall: Patient remains encephalopathic and unresponsive to verbal stimuli and sternal rub today. Patient was seen by neurosurgeon, he is not felt to be a surgical candidate.  He is currently for comfort care  Seizures Continue with Keppra 1000 mg twice daily and Ativan as needed   Recent septic arthritis of prosthetic hip Completed antibiotics   Acute urinary retention due to BPH Status post Foley catheter   Dysphagia Patient is currently on dysphagia 1 diet with nectar thick liquids.    Goals of care Palliative on board on his transition to full comfort care as he remained unresponsive.   RN Pressure Injury Documentation:    Malnutrition Type:  Nutrition Problem: Severe Malnutrition Etiology: acute illness   Malnutrition Characteristics:  Signs/Symptoms: moderate muscle depletion,  energy intake < or equal to 50% for > or equal to 5 days, percent weight loss (11.5% weight loss in 3 months) Percent weight loss: 11.5 %   Nutrition Interventions:  Interventions: Ensure Enlive (each supplement provides 350kcal and 20 grams of protein), MVI, Magic cup, Prostat  Estimated body mass index is 30.52 kg/m as calculated from the following:   Height as of this encounter: 5\' 5"  (1.651 m).   Weight as of this encounter: 83.2 kg.  Code Status: DNR comfort.  DVT Prophylaxis:  SCDs Start: 04/26/23 1626   Level of Care: Level of care: Med-Surg Family Communication: none at bedside.   Disposition Plan:     Remains inpatient appropriate:  comfort measures  Procedures:  None.   Consultants:   Palliative care Antimicrobials:   Anti-infectives (From admission, onward)    Start     Dose/Rate Route Frequency Ordered Stop   04/26/23 2200  cefadroxil (DURICEF) capsule 1,000 mg  Status:  Discontinued        1,000 mg Oral 2 times daily 04/26/23 1741 04/26/23 1800   04/26/23 1800  cefadroxil (DURICEF) capsule  1,000 mg  Status:  Discontinued        1,000 mg Oral 2 times daily 2023/05/13 1639 04/29/23 0753        Medications  Scheduled Meds:  glycopyrrolate  0.4 mg Intravenous TID   hydrocerin  1 Application Topical BID    morphine injection  1-2 mg Intravenous Q8H   olopatadine  1 drop Both Eyes QHS   Continuous Infusions:  levETIRAcetam 1,000 mg (05/07/23 1059)   PRN Meds:.acetaminophen, antiseptic oral rinse, food thickener, glycopyrrolate **AND** glycopyrrolate, haloperidol **OR** haloperidol **OR** haloperidol lactate, ipratropium-albuterol, labetalol, LORazepam, morphine injection **AND** morphine injection, ondansetron (ZOFRAN) IV, mouth rinse, polyvinyl alcohol    Subjective:   Kenneth Montoya was seen and examined today.  No new events overnight.   Objective:   Vitals:   05/06/23 0342 05/06/23 0922 05/06/23 1239 05/06/23 2103  BP:  (!) 142/74  119/70   Pulse:  (!) 101  (!) 113  Resp:  20 (!) 30 20  Temp:  (!) 100.7 F (38.2 C)  100 F (37.8 C)  TempSrc:  Oral  Oral  SpO2:  96%  90%  Weight: 83.2 kg     Height:        Intake/Output Summary (Last 24 hours) at 05/07/2023 1331 Last data filed at 05/07/2023 1240 Gross per 24 hour  Intake 200 ml  Output 975 ml  Net -775 ml   Filed Weights   05/04/23 0500 05/05/23 0515 05/06/23 0342  Weight: 80.4 kg 80.2 kg 83.2 kg     Exam General exam: ill appearing elderly gentleman, unresponsive.  Respiratory system: Clear to auscultation. Respiratory effort normal. Cardiovascular system: S1 & S2 heard, RRR. Gastrointestinal system: Abdomen is soft Central nervous system:lethargic, not following commands.  Skin: No rashes,  Psychiatry: unable to assess    Data Reviewed:  I have personally reviewed following labs and imaging studies   CBC Lab Results  Component Value Date   WBC 11.6 (H) 05/06/2023   RBC 3.52 (L) 05/06/2023   HGB 9.2 (L) 05/06/2023   HCT 29.2 (L) 05/06/2023   MCV 83.0 05/06/2023   MCH 26.1 05/06/2023   PLT 342 05/06/2023   MCHC 31.5 05/06/2023   RDW 16.7 (H) 05/06/2023   LYMPHSABS 0.6 (L) 03/12/2023   MONOABS 0.5 03/12/2023   EOSABS 377 04/08/2023   BASOSABS 52 04/08/2023     Last metabolic panel Lab Results  Component Value Date   NA 135 05/06/2023   K 4.4 05/06/2023   CL 101 05/06/2023   CO2 26 05/06/2023   BUN 18 05/06/2023   CREATININE 1.05 05/06/2023   GLUCOSE 116 (H) 05/06/2023   GFRNONAA >60 05/06/2023   CALCIUM 8.5 (L) 05/06/2023   PHOS 4.5 12/10/2020   PROT 5.7 (L) 05/06/2023   ALBUMIN 2.2 (L) 05/06/2023   BILITOT 0.8 05/06/2023   ALKPHOS 101 05/06/2023   AST 80 (H) 05/06/2023   ALT 82 (H) 05/06/2023   ANIONGAP 8 05/06/2023    CBG (last 3)  No results for input(s): "GLUCAP" in the last 72 hours.    Coagulation Profile: No results for input(s): "INR", "PROTIME" in the last 168 hours.   Radiology Studies: No results  found.     [name] M.D. [facility] 05/07/2023, 1:31 PM  Available via Epic secure chat 7am-7pm After 7 pm, please refer to night coverage provider listed on amion.

## 2023-05-07 NOTE — Progress Notes (Signed)
Ativan 1 mg IV given and Morphine drip started at 2. Family at bedside. Will continue to assess. Comfort cart and care bag given to dtr.

## 2023-05-07 NOTE — Progress Notes (Addendum)
Palliative:  HPI: HPI: 82 y.o. male  with past medical history of mild cognitive decline, recent THA in 01/2023, wound infection and likely septic arthritis after a fall causing injury to his incision in Oct 2024 who presented with confusion after a fall this morning.  admitted on 05-01-2023 with traumatic SAH after fall traumatic bilateral subdural hemorrhage, new onset seizure, acute encephalopathy in the setting of TBI, and others. Palliative medicine was consulted for GOC conversations and end-of-life care.    I met at Kenneth Montoya's bedside early and he has some tachypnea but no distress. I scheduled morphine to assist with progression to worsening dyspnea. I spoke with daughter, Kenneth Montoya, about changes. We reviewed progression. Kenneth Montoya expresses distress that she cannot be with him ongoing due to having to be with her mother. She questions taking him home with hospice and we fully review the risks vs benefits and pros vs cons of home with hospice. Ultimately we both agree that he is progressing and unstable to move from the hospital.   I made multiple visits to bedside throughout the day. Kenneth Montoya has had significant progression at end of life with severe symptoms. We made multiple attempts to get him more comfortable with morphine, dilaudid, ativan, haldol, tylenol, toradol, benadryl. Ultimately he did appear more comfortable but still with labored breathing. We discussed in detail comfort and that he is sedated with medication so he is not aware of his body's physiological response with labored breathing. I spoke with Kenneth Montoya about the option of propofol infusion and the risks vs benefits and expectations. Kenneth Montoya called a family member who is an Char[employer] fundraiser and we reviewed option of propofol infusion. Ultimately we decide not to pursue propofol but continue all other measures to provide comfort. Granddaughter also at bedside during discussions.   All questions/concerns addressed. Emotional support provided.   Exam:  Unresponsive. No grimacing. Ongoing labored breathing. Cheynes-Stokes pattern at times. Warm and diaphoretic - febrile. Abd soft.   Plan: - DNR - Comfort care - Anticipate hospital death - likely overnight - Continue and titrate morphine infusion as needed to achieve comfort - Continue scheduled Ativan - Seemed to have improvement with Tylenol suppository and IV benadryl - consider scheduling if needed  150 min  Kenneth Channel, NP Palliative Medicine Team Pager [phone] (Please see [email or web address] for schedule) Team Phone [phone]    Greater than 50%  of this time was spent counseling and coordinating care related to the above assessment and plan

## 2023-05-28 NOTE — Progress Notes (Signed)
Talked with the medical examer Dr Neale Burly, and called the funeral home ie [employer], [address], [place] to arrange pick up of the body.  Kenneth Montoya ie daughter left the funeral home of choice.  Patien placement made aware of death.

## 2023-05-28 NOTE — Progress Notes (Signed)
This chaplain responded to PMT NP-Alicia's consult for Pt. And family EOL spiritual care. The Pt. daughter-Julie and grand-daughter are at the bedside. The Pt. is resting comfortable at the time of the visit. [name] shared her observation of fewer patterns of Cheynes-Stokes breathing.  The chaplain listened reflectively to [name] as she reflected on the Pt. admission and story. [name] concluded with the sharing of her belief,  "the Pt. independent nature was controlling the sequence of events and additional needed time with family." The chaplain affirmed Julie's thoughts and plans for self care with the help of family.  The chaplain offered continued intercessory prayer and support as needed.  Chaplain Stephanie Acre [phone]

## 2023-05-28 NOTE — Progress Notes (Signed)
Pt pronounced dead.  [name] was the second verifier.  IV morphine stopped. Pt repositioned in the bed, daughter [name] called and made aware.  [name] is enroute to the hospital to see him.

## 2023-05-28 NOTE — Progress Notes (Signed)
Nutrition Brief Note  Chart reviewed. Pt now transitioning to comfort care.  No further nutrition interventions planned at this time.  Please re-consult as needed.   Elliot Dally, RD Registered Dietitian  See Amion for more information

## 2023-05-28 NOTE — Progress Notes (Incomplete)
Palliative:  HPI: HPI: 83 y.o. male with past medical history of mild cognitive decline, recent THA in 01/2023, wound infection and likely septic arthritis after a fall causing injury to his incision in Oct 2024 who presented with confusion after a fall this morning. admitted on 2023-05-05 with traumatic SAH after fall traumatic bilateral subdural hemorrhage, new onset seizure, acute encephalopathy in the setting of TBI, and others. Palliative medicine was consulted for GOC conversations and end-of-life care.    I met today at Kenneth Montoya's bedside. [name] present. I reviewed progress with off going nightshift RN who reports that it took much work but he was finally able to get comfortable with morphine infusion and increased Ativan dosages. He appears MUCH more comfortable this morning. Doing well with morphine infusion and scheduled Ativan. Having frequent apnea episodes. [name] knows that his time is very limited and likely to die anytime now. [name] reports that they thought he was going to go last night. She is happy with his level of comfort. We reviewed yesterday that all we will ***  Yong Channel, NP Palliative Medicine Team Pager [phone] (Please see [email or web address] for schedule) Team Phone [phone]    Greater than 50%  of this time was spent counseling and coordinating care related to the above assessment and plan

## 2023-05-28 NOTE — Death Summary Note (Signed)
DEATH SUMMARY   Patient Details  Name: Kenneth Montoya MRN: [id number] DOB: [birth year] NWG:NFAOZ, Bernadene Bell, MD Admission/Discharge Information   Admit Date:  2023/04/30  Date of Death: Date of Death: 05/12/2023  Time of Death: Time of Death: 1125  Length of Stay: 12-May-2023   Principle Cause of death: subarachnoid hemorrhage.   Hospital Diagnoses: Principal Problem:   SAH (subarachnoid hemorrhage) (HCC) Active Problems:   Traumatic intracerebral hemorrhage with unknown loss of consciousness status (HCC)   Protein-calorie malnutrition, severe   Hospital Course: 83 y/o gentleman with a history of mild cognitive decline, recent THA in 01/2023, wound infection and likely septic arthritis after a fall causing injury to his incision in Oct 2024 who presented with confusion after a fall this morning. He had an unwitnessed fall around 5:45 this morning at his rehab, and family visiting around 11AM felt that he was not acting normally and noticed the injury on the back of his head. They had him brought to the ED where he was found to have a SAH. Marland Kitchen His family had been working on a safe discharge plan for him this week . But patient continued to decline and became unresponsive. He was transitioned to comfort measures after speaking with palliative caare.  Assessment and Plan:   Traumatic subarachnoid hemorrhage s/p fall: Patient remained encephalopathic and unresponsive to verbal stimuli and sternal rub  Patient was seen by neurosurgeon, he is not felt to be a surgical candidate. Unfortunately he continued to decline and passed.    Seizures      Recent septic arthritis of prosthetic hip Completed antibiotics     Acute urinary retention due to BPH Status post Foley catheter     Dysphagia        Goals of care Palliative on board on his transition to full comfort care as he remained unresponsive.     RN Pressure Injury Documentation:   Malnutrition Type:   Nutrition Problem: Severe  Malnutrition Etiology: acute illness     Malnutrition Characteristics:   Signs/Symptoms: moderate muscle depletion, energy intake < or equal to 50% for > or equal to 5 days, percent weight loss (11.5% weight loss in 3 months) Percent weight loss: 11.5 %     Nutrition Interventions:   Interventions: Ensure Enlive (each supplement provides 350kcal and 20 grams of protein), MVI, Magic cup, Prostat   Estimated body mass index is 30.52 kg/m as calculated from the following:   Height as of this encounter: 5\' 5"  (1.651 m).   Weight as of this encounter: 83.2 kg.        Procedures: none.   Consultations: palliative care  The results of significant diagnostics from this hospitalization (including imaging, microbiology, ancillary and laboratory) are listed below for reference.   Significant Diagnostic Studies: DG Swallowing Func-Speech Pathology Result Date: 05/02/2023 Table formatting from the original result was not included. Modified Barium Swallow Study Patient Details Name: Kenneth Montoya MRN: [id number] Date of Birth: 03-30-1941 Today's Date: 05/02/2023 HPI/PMH: HPI: Pt is an 83 yo male admitted with AMS s/p fall at SNF. CT Head revealed extensive bifrontal contusions R>L, diffuse SAH, small bilateral SDH, and R frontal skull fx.  Pt became less responsive and transitioned to full comfort care.  Now more alert after being off medications and goals of care are dependent on swallow function.  Pt with suspected aspiration event with thin liquid grape juice on 12/5. PMH includes: CVA, mild cognitive decline, GERD, recent THA 01/2023, wound infection and  likely septic arthritis 02/2023 Clinical Impression: Pt presents with dry, damaged mucosa likely due to days without oral intake. Suspect dry pharyngeal mucosa as well given that primary impairment is liquids and solids clinging to pharyngeal tissue in the absence of significant weakness. Pts swallow is generally timely and ROM is WNL. However  there is residue present on the tongue, the vallecuale and pyriform sinuses. Pt has trace sensed aspiration of thin liquids due to accumulation of residue x2. Pt is stimulable for a second swallow to clear residue. Did not test solids gievn the condition of his oral mucosa.  Signs of a small cervical esophageal web, did not obstruct bolus flow. Recommend pt resume PO intake, nectar thick liquids and puree with upright posture. Will be candidate for upgraded textures if he generally improves otherwise. Factors that may increase risk of adverse event in presence of aspiration Rubye Oaks & Clearance Coots 2021): Factors that may increase risk of adverse event in presence of aspiration Rubye Oaks & Clearance Coots 2021): Frail or deconditioned; Dependence for feeding and/or oral hygiene; Inadequate oral hygiene; Reduced saliva Recommendations/Plan: Swallowing Evaluation Recommendations Swallowing Evaluation Recommendations Liquid Administration via: Cup; Straw Medication Administration: Crushed with puree Supervision: Full assist for feeding; Full supervision/cueing for swallowing strategies Treatment Plan Treatment Plan Treatment recommendations: Therapy as outlined in treatment plan below Follow-up recommendations: Skilled nursing-short term rehab (<3 hours/day) Treatment frequency: Min 2x/week Treatment duration: 2 weeks Interventions: Aspiration precaution training; Trials of upgraded texture/liquids; Diet toleration management by SLP; Patient/family education Recommendations Recommendations for follow up therapy are one component of a multi-disciplinary discharge planning process, led by the attending physician.  Recommendations may be updated based on patient status, additional functional criteria and insurance authorization. Assessment: Orofacial Exam: Orofacial Exam Oral Cavity - Dentition: Adequate natural dentition Anatomy: Anatomy: WFL Boluses Administered: Boluses Administered Boluses Administered: Thin liquids (Level 0); Mildly  thick liquids (Level 2, nectar thick); Moderately thick liquids (Level 3, honey thick); Puree  Oral Impairment Domain: Oral Impairment Domain Lip Closure: No labial escape Tongue control during bolus hold: Not tested Bolus preparation/mastication: Slow prolonged chewing/mashing with complete recollection Bolus transport/lingual motion: Delayed initiation of tongue motion (oral holding) Oral residue: Residue collection on oral structures Location of oral residue : Tongue Initiation of pharyngeal swallow : Posterior angle of the ramus  Pharyngeal Impairment Domain: Pharyngeal Impairment Domain Soft palate elevation: No bolus between soft palate (SP)/pharyngeal wall (PW) Laryngeal elevation: Complete superior movement of thyroid cartilage with complete approximation of arytenoids to epiglottic petiole Anterior hyoid excursion: Complete anterior movement Epiglottic movement: Complete inversion Laryngeal vestibule closure: Complete, no air/contrast in laryngeal vestibule Pharyngeal stripping wave : Present - complete Pharyngoesophageal segment opening: Complete distension and complete duration, no obstruction of flow Tongue base retraction: Trace column of contrast or air between tongue base and PPW Pharyngeal residue: Collection of residue within or on pharyngeal structures Location of pharyngeal residue: Valleculae; Pyriform sinuses; Tongue base  Esophageal Impairment Domain: No data recorded Pill: No data recorded Penetration/Aspiration Scale Score: Penetration/Aspiration Scale Score 1.  Material does not enter airway: Thin liquids (Level 0); Mildly thick liquids (Level 2, nectar thick); Moderately thick liquids (Level 3, honey thick); Puree 4.  Material enters airway, CONTACTS cords then ejected out: Thin liquids (Level 0) Compensatory Strategies: No data recorded  General Information: Caregiver present: No  Diet Prior to this Study: NPO   No data recorded  Respiratory Status: WFL   Supplemental O2: None (Room air)    History of Recent Intubation: No  Behavior/Cognition: Alert; Cooperative Self-Feeding  Abilities: Needs assist with self-feeding Baseline vocal quality/speech: Normal No data recorded Volitional Swallow: Able to elicit No data recorded Goal Planning: Prognosis for improved oropharyngeal function: Good Barriers to Reach Goals: Cognitive deficits No data recorded Patient/Family Stated Goal: wants a coke Consulted and agree with results and recommendations: Patient; Physician Pain: Pain Assessment Breathing: 0 Negative Vocalization: 0 Facial Expression: 0 Body Language: 0 Consolability: 0 PAINAD Score: 0 End of Session: Start Time:SLP Start Time (ACUTE ONLY): 1400 Stop Time: SLP Stop Time (ACUTE ONLY): 1420 Time Calculation:SLP Time Calculation (min) (ACUTE ONLY): 20 min Charges: SLP Evaluations $ SLP Speech Visit: 1 Visit SLP Evaluations $BSS Swallow: 1 Procedure $MBS Swallow: 1 Procedure SLP visit diagnosis: SLP Visit Diagnosis: Dysphagia, oropharyngeal phase (R13.12) Past Medical History: Past Medical History: Diagnosis Date  Anxiety   Arthritis   generalized  BPH (benign prostatic hyperplasia)   Cancer of skin, squamous cell   frozen   Cataract   bilateral sx  GERD (gastroesophageal reflux disease)   on meds  Hypertension   on meds  Shingles   SIADH (syndrome of inappropriate ADH production) (HCC)   Stroke [facility])  Past Surgical History: Past Surgical History: Procedure Laterality Date  CARPAL TUNNEL RELEASE Left 09/09/2022  Procedure: LEFT CARPAL TUNNEL RELEASE;  Surgeon: Betha Loa, MD;  Location: [facility];  Service: Orthopedics;  Laterality: Left;  Bier block  CATARACT EXTRACTION, BILATERAL    COLONOSCOPY  06/27/2020  COLONOSCOPY  2018  EYE SURGERY    gum graft    HIP ARTHROPLASTY Right 03/13/2023  Procedure: IRRIGATION AND DEBRIDEMENT RIGHT HIP;  Surgeon: Ollen Gross, MD;  Location: [facility];  Service: Orthopedics;  Laterality: Right;  INGUINAL HERNIA REPAIR    as infant  LUMBAR LAMINECTOMY   06/2015  POLYPECTOMY  2018  TA x 3  REVERSE TOTAL SHOULDER ARTHROPLASTY Bilateral   TONSILLECTOMY AND ADENOIDECTOMY    age 78   TOTAL HIP ARTHROPLASTY Right 02/12/2023  Procedure: RIGHT TOTAL HIP ARTHROPLASTY ANTERIOR APPROACH;  Surgeon: Ollen Gross, MD;  Location: [facility];  Service: Orthopedics;  Laterality: Right;  TOTAL KNEE ARTHROPLASTY Bilateral   TOTAL KNEE REVISION Right 12/25/2017  TRANSURETHRAL RESECTION OF PROSTATE    x 2  UPPER GASTROINTESTINAL ENDOSCOPY   DeBlois, Riley Nearing 05/02/2023, 3:07 PM   DG CHEST PORT 1 VIEW Result Date: 04/28/2023 CLINICAL DATA:  [id number] with fever. EXAM: PORTABLE CHEST 1 VIEW COMPARISON:  Portable chest yesterday at 4:42 p.m. FINDINGS: 5:14 a.m. There is subpleural fibrosis with a basal gradient. Chronic linear scarring or atelectasis in the lower lung fields, right mid field. No focal pneumonia is evident in the setting of low lung volumes. No pleural effusion. The aorta is tortuous and calcified with stable mediastinum. Mild cardiomegaly. No evidence of CHF. No new osseous findings. Osteopenia and Bilateral shoulder replacements. IMPRESSION: 1. Low lung volumes with subpleural fibrosis and chronic linear scarring or atelectasis. No focal pneumonia is evident in the setting of low lung volumes. 2. Aortic atherosclerosis. 3. Mild cardiomegaly. Electronically Signed   By: Almira Bar M.D.   On: 04/28/2023 06:47   CT HEAD WO CONTRAST ( ) Result Date: 04/27/2023 CLINICAL DATA:  Follow-up examination for head trauma. EXAM: CT HEAD WITHOUT CONTRAST TECHNIQUE: Contiguous axial images were obtained from the base of the skull through the vertex without intravenous contrast. RADIATION DOSE REDUCTION: This exam was performed according to the departmental dose-optimization program which includes automated exposure control, adjustment of the mA and/or kV according to patient size and/or use  of iterative reconstruction technique. COMPARISON:  CT from earlier the same day.  FINDINGS: Brain: Continued interval evolution of bifrontal hemorrhagic contusions, similar in size as compared to previous. Associated edema, similar. Acute subarachnoid hemorrhage involving the right cerebral hemisphere and right sylvian fissure is relatively similar as well. Extra-axial hemorrhages overlying both cerebral convexities measure up to 6 mm on the left and 5 mm on the right, similar. Trace right-to-left shift, unchanged. Small volume intraventricular hemorrhage, also unchanged. No hydrocephalus or trapping. Basilar cisterns remain patent. No new intracranial hemorrhage. No visible acute large vessel territory infarct. Underlying atrophy with chronic small vessel ischemic disease and chronic left cerebellar infarcts noted. Vascular: No visible abnormal hyperdense vessel, although vasculature partially obscured by intracranial hemorrhage. Skull: Right para median nondisplaced, nondepressed calvarial fracture again noted. Overlying scalp contusion. Sinuses/Orbits: Globes orbital soft tissues demonstrate no acute finding. Mild mucosal thickening about the ethmoidal air cells. Mastoid air cells and middle ear cavities remain clear. Other: None. IMPRESSION: 1. Continued interval evolution of bifrontal hemorrhagic contusions, similar in size as compared to previous. Associated edema with trace right-to-left midline shift, similar to prior. 2. Acute subarachnoid and subdural hemorrhage, not significantly changed. Trace intraventricular extension related to redistribution. No hydrocephalus. 3. No other new intracranial hemorrhage. Electronically Signed   By: Rise Mu M.D.   On: 04/27/2023 19:45   DG CHEST PORT 1 VIEW Result Date: 04/27/2023 CLINICAL DATA:  Hypoxemia EXAM: PORTABLE CHEST 1 VIEW COMPARISON:  05-26-23 FINDINGS: Shallow inspiration. Heart size and pulmonary vascularity are normal for technique. Linear atelectasis in the lung bases. Coarse parenchymal scarring suggested in the  lungs, likely chronic. No developing consolidation or edema. No pleural effusions. No pneumothorax. Mediastinal contours appear intact. Postoperative changes in the shoulders. IMPRESSION: Shallow inspiration with atelectasis in the lung bases. Chronic scarring in the lungs. Electronically Signed   By: Burman Nieves M.D.   On: 04/27/2023 19:37   CT HEAD WO CONTRAST ( ) Result Date: 04/27/2023 CLINICAL DATA:  Subdural hematoma. EXAM: CT HEAD WITHOUT CONTRAST TECHNIQUE: Contiguous axial images were obtained from the base of the skull through the vertex without intravenous contrast. RADIATION DOSE REDUCTION: This exam was performed according to the departmental dose-optimization program which includes automated exposure control, adjustment of the mA and/or kV according to patient size and/or use of iterative reconstruction technique. COMPARISON:  Head CT from yesterday FINDINGS: Brain: Bifrontal hemorrhagic contusion with expected blooming. The greatest increases in the inferior left frontal lobe where hemorrhagic component measures 14 mm in length, previously 5 mm. Associated increase in swelling around the multifocal frontal contusion. Subdural hematoma along the bilateral cerebral convexity without significant increase, up to 6 mm in thickness at the superior left frontal convexity. Subarachnoid hemorrhage greatest along the inferior frontal lobes and right sylvian fissure. No cerebral aneurysm on 01/18/2022 CTA. Mild leftward anterior midline shift. No entrapment or complicating infarct. There are chronic infarcts seen in the left cerebellum, bilateral occipital lobe, and right posterior frontal lobe. Vascular: Largely obscured vessels due to subarachnoid hemorrhage on the right. Skull: Nondepressed right para median frontal bone fracture reaching the right frontal sinus. Sinuses/Orbits: No detected injury. IMPRESSION: 1. Expected blooming of bifrontal hemorrhagic contusions. No significant change in  subarachnoid and subdural clot. 2. No herniation or significant shift. Electronically Signed   By: Tiburcio Pea M.D.   On: 04/27/2023 05:17   DG CHEST PORT 1 VIEW Result Date: 2023-05-26 CLINICAL DATA:  Pain after fall EXAM: PORTABLE CHEST 1 VIEW COMPARISON:  X-ray 03/10/2023 and  older FINDINGS: Underinflation. Normal cardiopericardial silhouette when adjusting for level of inflation. Calcified aorta no pneumothorax or effusion. Chronic lung changes no consolidation or edema. Overlapping cardiac leads. Bilateral shoulder arthroplasties. IMPRESSION: Underinflation with chronic lung changes. Electronically Signed   By: Karen Kays M.D.   On: 04/26/2023 16:17   DG Pelvis Portable Result Date: 04/26/2023 CLINICAL DATA:  Pain after fall EXAM: PORTABLE PELVIS 1 VIEWS COMPARISON:  None Available. FINDINGS: Right hip arthroplasty identified with Press-Fit components. The tip of the femoral component is not included in the imaging field. What is seen of the prosthesis is without hardware failure. Chondrocalcinosis along the pubic symphysis. Slight joint space loss of the left hip. Osteopenia. No fracture or dislocation. Degenerative changes of the visualized lumbar spine at the edge of the imaging field. Overlapping cardiac leads. Hyperostosis. Vascular calcifications. With this level of osteopenia subtle nondisplaced injury is difficult to exclude in the setting of osteopenia and if there is further concern follow up CT is recommended for further delineation IMPRESSION: Osteopenia with degenerative changes. Right hip arthroplasty. Chondrocalcinosis Electronically Signed   By: Karen Kays M.D.   On: 04/26/2023 15:26   CT CERVICAL SPINE WO CONTRAST Result Date: 04/26/2023 CLINICAL DATA:  Poly trauma. Unwitnessed fall. The patient is on blood thinners. EXAM: CT CERVICAL SPINE WITHOUT CONTRAST TECHNIQUE: Multidetector CT imaging of the cervical spine was performed without intravenous contrast. Multiplanar CT  image reconstructions were also generated. RADIATION DOSE REDUCTION: This exam was performed according to the departmental dose-optimization program which includes automated exposure control, adjustment of the mA and/or kV according to patient size and/or use of iterative reconstruction technique. COMPARISON:  None Available. FINDINGS: Alignment: Grade 1 degenerative anterolisthesis present at C4-5. Slight degenerative anterolisthesis is present at C7-T1. No other significant listhesis is present. Straightening of the normal cervical lordosis is present. Skull base and vertebrae: The craniocervical junction is normal. Vertebral body heights are maintained. No acute fractures are present. Degenerative endplate sclerotic changes are present C5-6 and C6-7. Soft tissues and spinal canal: No prevertebral fluid or swelling. No visible canal hematoma. Disc levels: Multilevel degenerative changes are present throughout the cervical spine. Uncovertebral and facet hypertrophy lead to moderate to severe right foraminal stenosis at C4-5. Right greater than left foraminal narrowing is present at C5-6 and C6-7. Upper chest: The lung apices are not imaged. IMPRESSION: 1. No acute fracture or traumatic subluxation. 2. Multilevel degenerative changes of the cervical spine as described. Electronically Signed   By: Marin Roberts M.D.   On: 04/26/2023 13:24   CT HEAD WO CONTRAST Result Date: 04/26/2023 CLINICAL DATA:  Unwitnessed fall. Altered mental status. The patient takes Plavix for a DVT. EXAM: CT HEAD WITHOUT CONTRAST TECHNIQUE: Contiguous axial images were obtained from the base of the skull through the vertex without intravenous contrast. RADIATION DOSE REDUCTION: This exam was performed according to the departmental dose-optimization program which includes automated exposure control, adjustment of the mA and/or kV according to patient size and/or use of iterative reconstruction technique. COMPARISON:  CT head  without contrast 09/18/2021 FINDINGS: Brain: Extensive hemorrhagic contusion is present in the anterior inferior frontal lobes, right greater than left. Diffuse subarachnoid hemorrhage present in the right sylvian fissure and right greater than left suprasellar cisterns. Bilateral subdural hematomas are present measuring 5 mm on the left and 4 mm on the right. Diffuse subcortical and periventricular white matter hypoattenuation is similar the prior study. Remote left cerebellar infarcts are again noted. Remote occipital lobe infarcts are stable. Vascular: No  hyperdense vessel or unexpected calcification. Skull: A nondisplaced right paramedian frontal skull fracture extends to the coronal suture. Associated right supraorbital scalp hematoma is present. No radiopaque foreign body or pneumocephalus is present. Sinuses/Orbits: The paranasal sinuses and mastoid air cells are clear. Bilateral lens replacements are present. Globes and orbits are otherwise within normal limits. IMPRESSION: 1. Extensive hemorrhagic contusion in the anterior inferior frontal lobes, right greater than left. 2. Diffuse subarachnoid hemorrhage in the right Sylvian fissure and right greater than left suprasellar cisterns. 3. Bilateral subdural hematomas measuring 5 mm on the left and 4 mm on the right. 4. Nondisplaced right paramedian frontal skull fracture extends to the coronal suture. Associated right supraorbital scalp hematoma is present. 5. Stable appearance of diffuse subcortical and periventricular white matter disease. This likely reflects the sequela of chronic microvascular ischemia. 6. 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 Critical Value/emergent results were called by telephone at the time of interpretation on 04/26/2023 at 1:17 pm to provider ANDREW TEE , who verbally acknowledged these results. Electronically Signed   By: Marin Roberts M.D.   On: 04/26/2023 13:20    Microbiology: No  results found for this or any previous visit (from the past 240 hours).  Time spent: 28 minutes  Signed: [name], MD 28-May-2023

## 2023-05-28 DEATH — deceased

## 2023-06-04 ENCOUNTER — Other Ambulatory Visit: Payer: Self-pay | Admitting: Internal Medicine

## 2023-07-29 ENCOUNTER — Ambulatory Visit: Payer: 59 | Admitting: Neurology

## 2023-08-04 ENCOUNTER — Ambulatory Visit: Payer: 59 | Admitting: Internal Medicine
